# Patient Record
Sex: Female | Born: 1945 | Race: White | Hispanic: No | Smoking: Never smoker
Health system: Southern US, Community
[De-identification: ages and names within clinical notes are randomized; demographics above are authoritative.]

## PROBLEM LIST (undated history)

## (undated) DIAGNOSIS — I5031 Acute diastolic (congestive) heart failure: Secondary | ICD-10-CM

## (undated) DIAGNOSIS — M1990 Unspecified osteoarthritis, unspecified site: Secondary | ICD-10-CM

## (undated) DIAGNOSIS — E119 Type 2 diabetes mellitus without complications: Secondary | ICD-10-CM

## (undated) DIAGNOSIS — C186 Malignant neoplasm of descending colon: Secondary | ICD-10-CM

## (undated) DIAGNOSIS — C187 Malignant neoplasm of sigmoid colon: Secondary | ICD-10-CM

## (undated) DIAGNOSIS — I482 Chronic atrial fibrillation, unspecified: Secondary | ICD-10-CM

## (undated) DIAGNOSIS — I10 Essential (primary) hypertension: Secondary | ICD-10-CM

## (undated) DIAGNOSIS — E669 Obesity, unspecified: Secondary | ICD-10-CM

---

## 1998-04-21 ENCOUNTER — Ambulatory Visit (HOSPITAL_COMMUNITY): Admission: RE | Admit: 1998-04-21 | Discharge: 1998-04-21 | Payer: Self-pay | Admitting: Family Medicine

## 2000-04-20 ENCOUNTER — Emergency Department (HOSPITAL_COMMUNITY): Admission: EM | Admit: 2000-04-20 | Discharge: 2000-04-21 | Payer: Self-pay | Admitting: Emergency Medicine

## 2000-04-20 ENCOUNTER — Encounter: Payer: Self-pay | Admitting: Emergency Medicine

## 2000-04-21 ENCOUNTER — Encounter: Payer: Self-pay | Admitting: Emergency Medicine

## 2000-07-02 ENCOUNTER — Encounter: Admission: RE | Admit: 2000-07-02 | Discharge: 2000-07-17 | Payer: Self-pay | Admitting: Orthopedic Surgery

## 2002-10-30 ENCOUNTER — Encounter: Admission: RE | Admit: 2002-10-30 | Discharge: 2003-01-28 | Payer: Self-pay | Admitting: Family Medicine

## 2005-08-23 ENCOUNTER — Emergency Department (HOSPITAL_COMMUNITY): Admission: EM | Admit: 2005-08-23 | Discharge: 2005-08-23 | Payer: Self-pay | Admitting: Emergency Medicine

## 2013-05-02 ENCOUNTER — Telehealth: Payer: Self-pay | Admitting: Hematology & Oncology

## 2013-05-02 NOTE — Telephone Encounter (Signed)
Left pt message to call and schedule appointment °

## 2013-05-05 ENCOUNTER — Telehealth: Payer: Self-pay | Admitting: Hematology & Oncology

## 2013-05-05 NOTE — Telephone Encounter (Signed)
Talked with Pt to schedule appointment, she said she did not know what this was for. She took down my information and will call back to schedule after she talks to referring.

## 2013-05-07 ENCOUNTER — Telehealth: Payer: Self-pay | Admitting: Hematology & Oncology

## 2013-05-07 NOTE — Telephone Encounter (Signed)
Bobbie from referring called said pt refused referral and she will call if change her mind

## 2013-05-07 NOTE — Telephone Encounter (Signed)
Left pt message to see if she wanted to schedule appointment. Left message with Bobbi at referring to see if they had talked to patient and to call me.

## 2014-11-19 DIAGNOSIS — M1712 Unilateral primary osteoarthritis, left knee: Secondary | ICD-10-CM | POA: Diagnosis not present

## 2015-02-12 DIAGNOSIS — R51 Headache: Secondary | ICD-10-CM | POA: Diagnosis not present

## 2015-03-25 ENCOUNTER — Other Ambulatory Visit (HOSPITAL_COMMUNITY): Payer: Self-pay | Admitting: Family Medicine

## 2015-03-25 ENCOUNTER — Encounter (HOSPITAL_COMMUNITY): Payer: Self-pay | Admitting: Emergency Medicine

## 2015-03-25 ENCOUNTER — Observation Stay (HOSPITAL_COMMUNITY)
Admission: EM | Admit: 2015-03-25 | Discharge: 2015-03-27 | Disposition: A | Discharge status: Home / Self Care | Payer: Commercial Managed Care - HMO | Attending: Internal Medicine | Admitting: Internal Medicine

## 2015-03-25 ENCOUNTER — Encounter (HOSPITAL_COMMUNITY): Payer: Self-pay

## 2015-03-25 DIAGNOSIS — Z794 Long term (current) use of insulin: Secondary | ICD-10-CM

## 2015-03-25 DIAGNOSIS — I10 Essential (primary) hypertension: Secondary | ICD-10-CM | POA: Diagnosis not present

## 2015-03-25 DIAGNOSIS — Z79899 Other long term (current) drug therapy: Secondary | ICD-10-CM | POA: Diagnosis not present

## 2015-03-25 DIAGNOSIS — D649 Anemia, unspecified: Secondary | ICD-10-CM | POA: Insufficient documentation

## 2015-03-25 DIAGNOSIS — E119 Type 2 diabetes mellitus without complications: Secondary | ICD-10-CM | POA: Insufficient documentation

## 2015-03-25 DIAGNOSIS — R410 Disorientation, unspecified: Secondary | ICD-10-CM | POA: Diagnosis not present

## 2015-03-25 DIAGNOSIS — E1165 Type 2 diabetes mellitus with hyperglycemia: Secondary | ICD-10-CM | POA: Diagnosis not present

## 2015-03-25 DIAGNOSIS — N179 Acute kidney failure, unspecified: Secondary | ICD-10-CM | POA: Diagnosis not present

## 2015-03-25 DIAGNOSIS — R42 Dizziness and giddiness: Secondary | ICD-10-CM | POA: Diagnosis not present

## 2015-03-25 DIAGNOSIS — M1990 Unspecified osteoarthritis, unspecified site: Secondary | ICD-10-CM | POA: Insufficient documentation

## 2015-03-25 DIAGNOSIS — N19 Unspecified kidney failure: Secondary | ICD-10-CM

## 2015-03-25 DIAGNOSIS — IMO0001 Reserved for inherently not codable concepts without codable children: Secondary | ICD-10-CM

## 2015-03-25 DIAGNOSIS — Z9119 Patient's noncompliance with other medical treatment and regimen: Secondary | ICD-10-CM | POA: Diagnosis not present

## 2015-03-25 DIAGNOSIS — R109 Unspecified abdominal pain: Secondary | ICD-10-CM | POA: Diagnosis present

## 2015-03-25 DIAGNOSIS — M542 Cervicalgia: Secondary | ICD-10-CM | POA: Diagnosis not present

## 2015-03-25 DIAGNOSIS — N183 Chronic kidney disease, stage 3 (moderate): Secondary | ICD-10-CM | POA: Diagnosis not present

## 2015-03-25 DIAGNOSIS — R600 Localized edema: Secondary | ICD-10-CM | POA: Diagnosis not present

## 2015-03-25 DIAGNOSIS — E785 Hyperlipidemia, unspecified: Secondary | ICD-10-CM | POA: Diagnosis not present

## 2015-03-25 HISTORY — DX: Essential (primary) hypertension: I10

## 2015-03-25 HISTORY — DX: Type 2 diabetes mellitus without complications: E11.9

## 2015-03-25 HISTORY — DX: Unspecified osteoarthritis, unspecified site: M19.90

## 2015-03-25 LAB — URINALYSIS, ROUTINE W REFLEX MICROSCOPIC
BILIRUBIN URINE: NEGATIVE
Glucose, UA: NEGATIVE mg/dL
Hgb urine dipstick: NEGATIVE
KETONES UR: NEGATIVE mg/dL
Nitrite: POSITIVE — AB
PH: 5 (ref 5.0–8.0)
PROTEIN: NEGATIVE mg/dL
Specific Gravity, Urine: 1.014 (ref 1.005–1.030)
Urobilinogen, UA: 0.2 mg/dL (ref 0.0–1.0)

## 2015-03-25 LAB — CBC
HCT: 34.9 % — ABNORMAL LOW (ref 36.0–46.0)
Hemoglobin: 10.4 g/dL — ABNORMAL LOW (ref 12.0–15.0)
MCH: 27.2 pg (ref 26.0–34.0)
MCHC: 29.8 g/dL — ABNORMAL LOW (ref 30.0–36.0)
MCV: 91.1 fL (ref 78.0–100.0)
Platelets: 209 10*3/uL (ref 150–400)
RBC: 3.83 MIL/uL — ABNORMAL LOW (ref 3.87–5.11)
RDW: 15.7 % — ABNORMAL HIGH (ref 11.5–15.5)
WBC: 7.5 10*3/uL (ref 4.0–10.5)

## 2015-03-25 LAB — COMPREHENSIVE METABOLIC PANEL
ALT: 24 U/L (ref 14–54)
AST: 26 U/L (ref 15–41)
Albumin: 3.4 g/dL — ABNORMAL LOW (ref 3.5–5.0)
Alkaline Phosphatase: 55 U/L (ref 38–126)
Anion gap: 8 (ref 5–15)
BUN: 77 mg/dL — ABNORMAL HIGH (ref 6–20)
CO2: 21 mmol/L — ABNORMAL LOW (ref 22–32)
Calcium: 8.5 mg/dL — ABNORMAL LOW (ref 8.9–10.3)
Chloride: 112 mmol/L — ABNORMAL HIGH (ref 101–111)
Creatinine, Ser: 2.65 mg/dL — ABNORMAL HIGH (ref 0.44–1.00)
GFR calc Af Amer: 20 mL/min — ABNORMAL LOW (ref >60)
GFR calc non Af Amer: 17 mL/min — ABNORMAL LOW (ref >60)
Glucose, Bld: 122 mg/dL — ABNORMAL HIGH (ref 65–99)
Potassium: 4.4 mmol/L (ref 3.5–5.1)
Sodium: 141 mmol/L (ref 135–145)
Total Bilirubin: 0.5 mg/dL (ref 0.3–1.2)
Total Protein: 6.4 g/dL — ABNORMAL LOW (ref 6.5–8.1)

## 2015-03-25 LAB — URINE MICROSCOPIC-ADD ON

## 2015-03-25 LAB — LIPASE, BLOOD: Lipase: 15 U/L — ABNORMAL LOW (ref 22–51)

## 2015-03-25 MED ORDER — SODIUM CHLORIDE 0.9 % IV BOLUS (SEPSIS)
1000.0000 mL | Freq: Once | INTRAVENOUS | Status: DC
  Administered 2015-03-25: 1000 mL via INTRAVENOUS

## 2015-03-25 NOTE — ED Notes (Signed)
Pt sent by PCP after being seen for neck pain today. Pt had sudden onset of neck pain yesterday and upon follow up with PCP had some labwork drawn. Pt's PCP sent her to ED after seeing "her kidney function was low." Pt does not have hx of kidney problems. Pt denies urinary symptoms, flank pain, dysuria. Denies chest pain, SOB, dizziness. Per family, pt was having moments of confusion yesterday. A&Ox4.

## 2015-03-25 NOTE — H&P (Signed)
[facility] History and Physical  Cassandra Allen [id number] DOB: June 29, 1946 DOA: 03/25/2015  Referring physician: Alfonzo Beers, MD PCP: Cassandra Coma, MD   Chief Complaint: Abnormal Labs  HPI: Cassandra Allen is a 69 y.o. female with prior history of hypertension and diabetes mellitus presents with abnormal lab values. She states that she went to her PCP for stiffness of her neck. She states she has been taking hydrocodone for the pain. She has been on the medications since 01/25/15 for chronic pain. She had some blood work drawn and this was abnormal with her creatinine being elevated. She has no prior history of renal failure. She states that she has no fevers noted she has no abdominal pain noted. She denies having dysuria and her urine is normal color. She states that there has been no blood. She has no burning with urination. She has history of arthritis which she does not know if it is RA. She states that she has been taking losartan-HCTZ for HTN for about a year. She has been a diabetic for many years not on insulin but is taking oral glipizide.   Review of Systems:  Constitutional:  No weight loss, night sweats, Fevers, chills HEENT:  No headaches, itching, ear ache, nasal congestion, post nasal drip,  Cardio-vascular:  No chest pain, Orthopnea, PND, swelling in lower extremities  GI:  No heartburn, indigestion, abdominal pain, nausea, vomiting, diarrhea  Resp:  No shortness of breath with exertion or at rest. No coughing up of blood.No change in color of mucus.No wheezing Skin:  no rash or lesions GU:  no dysuria, change in color of urine, no urgency or frequency  Musculoskeletal:  No joint pain or swelling. No decreased range of motion. +neck pain.  Psych:  No change in mood or affect. No depression or anxiety   Past Medical History  Diagnosis Date  . Diabetes mellitus without complication   . Hypertension   . Arthritis    History reviewed. No  pertinent past surgical history. Social History:  reports that she has never smoked. She does not have any smokeless tobacco history on file. She reports that she does not drink alcohol. Her drug history is not on file.  No Known Allergies  No family history on file.   Prior to Admission medications   Medication Sig Start Date End Date Taking? Authorizing Provider  cyclobenzaprine (FLEXERIL) 10 MG tablet Take 10 mg by mouth 3 (three) times daily as needed. 03/25/15  Yes Historical Provider, MD  GLIPIZIDE XL 5 MG 24 hr tablet Take 5 mg by mouth daily. 01/06/15  Yes Historical Provider, MD  HYDROcodone-acetaminophen (NORCO/VICODIN) 5-325 MG per tablet Take 1 tablet by mouth 2 (two) times daily as needed. 02/23/15  Yes Historical Provider, MD  loperamide (IMODIUM) 2 MG capsule Take 2-4 mg by mouth 4 (four) times daily as needed for diarrhea or loose stools.   Yes Historical Provider, MD  losartan-hydrochlorothiazide (HYZAAR) 100-12.5 MG per tablet Take 1 tablet by mouth every morning. 03/16/15  Yes Historical Provider, MD  oxybutynin (DITROPAN) 5 MG tablet Take 5 mg by mouth 3 (three) times daily. 12/26/14  Yes Historical Provider, MD  SB LOW DOSE ASA EC 81 MG EC tablet Take 81 mg by mouth daily. 01/15/15  Yes Historical Provider, MD  sertraline (ZOLOFT) 50 MG tablet Take 50 mg by mouth daily. 03/11/15  Yes Historical Provider, MD  simethicone (MYLICON) 80 MG chewable tablet Chew 80-160 mg by mouth 2 (two) times daily as needed. 02/23/15  Yes Historical Provider, MD  simvastatin (ZOCOR) 20 MG tablet Take 20 mg by mouth every evening.  02/28/15  Yes Historical Provider, MD   Physical Exam: Filed Vitals:   03/25/15 2029 03/25/15 2318  BP: 112/45 116/30  Pulse: 80 77  Temp: 98.7 F (37.1 C)   TempSrc: Oral   Resp: 18 16  SpO2: 94% 94%    Wt Readings from Last 3 Encounters:  No data found for Wt    General:  Appears calm and comfortable Eyes: PERRL, normal lids, irises & conjunctiva ENT: grossly  normal hearing, lips & tongue Neck: no LAD, masses or thyromegaly Cardiovascular: RRR, no m/r/g. No LE edema. Respiratory: CTA bilaterally, no w/r/r. Normal respiratory effort. Abdomen: soft, ntnd Skin: no rash or induration seen on limited exam Musculoskeletal: grossly normal tone BUE/BLE Psychiatric: grossly normal mood and affect, speech fluent and appropriate Neurologic: grossly non-focal.          Labs on Admission:  Basic Metabolic Panel:  Recent Labs Lab 03/25/15 2114  NA 141  K 4.4  CL 112*  CO2 21*  GLUCOSE 122*  BUN 77*  CREATININE 2.65*  CALCIUM 8.5*   Liver Function Tests:  Recent Labs Lab 03/25/15 2114  AST 26  ALT 24  ALKPHOS 55  BILITOT 0.5  PROT 6.4*  ALBUMIN 3.4*    Recent Labs Lab 03/25/15 2114  LIPASE 15*   No results for input(s): AMMONIA in the last 168 hours. CBC:  Recent Labs Lab 03/25/15 2114  WBC 7.5  HGB 10.4*  HCT 34.9*  MCV 91.1  PLT 209   Cardiac Enzymes: No results for input(s): CKTOTAL, CKMB, CKMBINDEX, TROPONINI in the last 168 hours.  BNP (last 3 results) No results for input(s): BNP in the last 8760 hours.  ProBNP (last 3 results) No results for input(s): PROBNP in the last 8760 hours.  CBG: No results for input(s): GLUCAP in the last 168 hours.  Radiological Exams on Admission: No results found.    Assessment/Plan Principal Problem:   Acute renal failure Active Problems:   Essential (primary) hypertension   Diabetes mellitus without complication   AKI (acute kidney injury)   1. Acute Kidney Injury likely secondary to dehydration -likely due to dehydration but also does have diabetes which could be contributing we have no old labs here to compare -will start on IVF now -ordered US kidney -ANA sed rate RF ANCA anti GBM ordered -consider nephrology consult  2. Essential HTN -will hold losartan-HCTZ due to acute renal failure -will monitor pressures which were actually soft on  presentation  3. Diabetes Mellitus without complication -will check FSBS -SSI as needed    Code Status: Full Code (must indicate code status--if unknown or must be presumed, indicate so) DVT Prophylaxis:heparin Family Communication: None (indicate person spoken with, if applicable, with phone number if by telephone) Disposition Plan: Home (indicate anticipated LOS)  Time spent: 45min  Verne Lanuza A [facility] Pager [phone]

## 2015-03-25 NOTE — ED Notes (Signed)
Took Pt to bathroom pt was unable to urinate.

## 2015-03-25 NOTE — ED Provider Notes (Signed)
CSN: [id number]     Arrival date & time 03/25/15  2022 History   First MD Initiated Contact with Patient 03/25/15 2051     Chief Complaint  Patient presents with  . Neck Pain  . Flank Pain     (Consider location/radiation/quality/duration/timing/severity/associated sxs/prior Treatment) HPI  Pt presenting with c/o abnormal lab value.  Pt states that she was seen by her doctor earlier today for neck pain- had some labs drawn by PMD and was told to come to the ED due to abnormal kidney function.  She has hx of DM, HTN.  She has no prior hx of renal failure.  She states she has been more confused and tired than normal for the past several days.  No fever/chills.  No vomiting or diarrhea.  She states hse has been drinking liquids well.  There are no other associated systemic symptoms, there are no other alleviating or modifying factors.   Past Medical History  Diagnosis Date  . Diabetes mellitus without complication   . Hypertension   . Arthritis    History reviewed. No pertinent past surgical history. No family history on file. History  Substance Use Topics  . Smoking status: Never Smoker   . Smokeless tobacco: Not on file  . Alcohol Use: No   OB History    No data available     Review of Systems  ROS reviewed and all otherwise negative except for mentioned in HPI    Allergies  Review of patient's allergies indicates no known allergies.  Home Medications   Prior to Admission medications   Medication Sig Start Date End Date Taking? Authorizing Provider  cyclobenzaprine (FLEXERIL) 10 MG tablet Take 10 mg by mouth 3 (three) times daily as needed. 03/25/15  Yes Historical Provider, MD  GLIPIZIDE XL 5 MG 24 hr tablet Take 5 mg by mouth daily. 01/06/15  Yes Historical Provider, MD  loperamide (IMODIUM) 2 MG capsule Take 2-4 mg by mouth 4 (four) times daily as needed for diarrhea or loose stools.   Yes Historical Provider, MD  oxybutynin (DITROPAN) 5 MG tablet Take 5 mg by mouth 3  (three) times daily. 12/26/14  Yes Historical Provider, MD  SB LOW DOSE ASA EC 81 MG EC tablet Take 81 mg by mouth daily. 01/15/15  Yes Historical Provider, MD  sertraline (ZOLOFT) 50 MG tablet Take 50 mg by mouth daily. 03/11/15  Yes Historical Provider, MD  simethicone (MYLICON) 80 MG chewable tablet Chew 80-160 mg by mouth 2 (two) times daily as needed. 02/23/15  Yes Historical Provider, MD  simvastatin (ZOCOR) 20 MG tablet Take 20 mg by mouth every evening.  02/28/15  Yes Historical Provider, MD  cephALEXin (KEFLEX) 500 MG capsule Take 1 capsule (500 mg total) by mouth 2 (two) times daily. 03/27/15   Hosie Poisson, MD  folic acid (FOLVITE) 1 MG tablet Take 1 tablet (1 mg total) by mouth daily. 03/27/15   Hosie Poisson, MD  thiamine 100 MG tablet Take 1 tablet (100 mg total) by mouth daily. 03/27/15   Hosie Poisson, MD   BP 129/60 mmHg  Pulse 74  Temp(Src) 98.1 F (36.7 C) (Oral)  Resp 16  Ht 5\' 3"  (1.6 m)  Wt   SpO2 94%  Vitals reviewed Physical Exam  Physical Examination: General appearance - alert, well appearing, and in no distress Mental status - alert, oriented to person, place, and time Eyes - pupils equal and reactive, extraocular eye movements intact Mouth - mucous membranes moist, pharynx normal  without lesions Chest - clear to auscultation, no wheezes, rales or rhonchi, symmetric air entry Heart - normal rate, regular rhythm, normal S1, S2, no murmurs, rubs, clicks or gallops Abdomen - soft, nontender, nondistended, no masses or organomegaly Extremities - peripheral pulses normal, no pedal edema, no clubbing or cyanosis Skin - normal coloration and turgor, no rashes  ED Course  Procedures (including critical care time)  11:28 PM d/w Dr. Humphrey Rolls, [facility] for [facility].  He will see the patient in the ED.   Labs Review Labs Reviewed  COMPREHENSIVE METABOLIC PANEL - Abnormal; Notable for the following:    Chloride 112 (*)    CO2 21 (*)    Glucose, Bld 122 (*)    BUN 77 (*)     Creatinine, Ser 2.65 (*)    Calcium 8.5 (*)    Total Protein 6.4 (*)    Albumin 3.4 (*)    GFR calc non Af Amer 17 (*)    GFR calc Af Amer 20 (*)    All other components within normal limits  CBC - Abnormal; Notable for the following:    RBC 3.83 (*)    Hemoglobin 10.4 (*)    HCT 34.9 (*)    MCHC 29.8 (*)    RDW 15.7 (*)    All other components within normal limits  LIPASE, BLOOD - Abnormal; Notable for the following:    Lipase 15 (*)    All other components within normal limits  URINALYSIS, ROUTINE W REFLEX MICROSCOPIC - Abnormal; Notable for the following:    APPearance CLOUDY (*)    Nitrite POSITIVE (*)    Leukocytes, UA MODERATE (*)    All other components within normal limits  URINE MICROSCOPIC-ADD ON - Abnormal; Notable for the following:    Bacteria, UA MANY (*)    Casts HYALINE CASTS (*)    All other components within normal limits  COMPREHENSIVE METABOLIC PANEL - Abnormal; Notable for the following:    CO2 21 (*)    BUN 75 (*)    Creatinine, Ser 2.54 (*)    Calcium 8.3 (*)    Total Protein 6.0 (*)    Albumin 3.2 (*)    GFR calc non Af Amer 18 (*)    GFR calc Af Amer 21 (*)    All other components within normal limits  CBC - Abnormal; Notable for the following:    RBC 3.41 (*)    Hemoglobin 9.4 (*)    HCT 31.0 (*)    RDW 15.7 (*)    All other components within normal limits  HEMOGLOBIN A1C - Abnormal; Notable for the following:    Hgb A1c MFr Bld 7.0 (*)    All other components within normal limits  SEDIMENTATION RATE - Abnormal; Notable for the following:    Sed Rate 45 (*)    All other components within normal limits  GLUCOSE, CAPILLARY - Abnormal; Notable for the following:    Glucose-Capillary 115 (*)    All other components within normal limits  BASIC METABOLIC PANEL - Abnormal; Notable for the following:    Chloride 114 (*)    Glucose, Bld 191 (*)    BUN 50 (*)    Creatinine, Ser 1.29 (*)    Calcium 8.3 (*)    GFR calc non Af Amer 42 (*)    GFR  calc Af Amer 48 (*)    All other components within normal limits  GLUCOSE, CAPILLARY - Abnormal; Notable for the following:  Glucose-Capillary 171 (*)    All other components within normal limits  GLUCOSE, CAPILLARY - Abnormal; Notable for the following:    Glucose-Capillary 217 (*)    All other components within normal limits  GLUCOSE, CAPILLARY - Abnormal; Notable for the following:    Glucose-Capillary 142 (*)    All other components within normal limits  URINE CULTURE  TSH  RHEUMATOID FACTOR  GLUCOSE, CAPILLARY  SODIUM, URINE, RANDOM  CREATININE, URINE, RANDOM  ANCA TITERS  GLOMERULAR BASEMENT MEMBRANE ANTIBODIES    Imaging Review US Renal  03/26/2015   CLINICAL DATA:  Acute renal failure.  Hypertension.  Diabetes.  EXAM: RENAL / URINARY TRACT ULTRASOUND COMPLETE  COMPARISON:  None.  FINDINGS: Right Kidney:  Length: 10.9 cm. No hydronephrosis. Normal renal cortical thickness and echogenicity.  Left Kidney:  Length: 11.3 cm. No hydronephrosis. Normal renal cortical thickness and echogenicity.  Bladder:  Appears normal for degree of bladder distention.  IMPRESSION: Normal renal ultrasound.   Electronically Signed   By: Abigail Miyamoto M.D.   On: 03/26/2015 09:26     EKG Interpretation None      MDM   Final diagnoses:  Renal failure  Anemia, unspecified anemia type    Pt presenting with new renal failure- hx of DM and HTN.  Pt is anemic also but does not seem to be symptomatic from this.  Her blo is well controlled in the ED.  Pt treated with IV fluids, admitted to [facility] for further workup and management of renal failure.  Potassium normal.      Alfonzo Beers, MD 03/27/15 1719

## 2015-03-26 ENCOUNTER — Observation Stay (HOSPITAL_COMMUNITY): Payer: Commercial Managed Care - HMO

## 2015-03-26 DIAGNOSIS — Z79899 Other long term (current) drug therapy: Secondary | ICD-10-CM | POA: Diagnosis not present

## 2015-03-26 DIAGNOSIS — N179 Acute kidney failure, unspecified: Secondary | ICD-10-CM | POA: Diagnosis not present

## 2015-03-26 DIAGNOSIS — D649 Anemia, unspecified: Secondary | ICD-10-CM | POA: Diagnosis not present

## 2015-03-26 DIAGNOSIS — I10 Essential (primary) hypertension: Secondary | ICD-10-CM | POA: Diagnosis not present

## 2015-03-26 DIAGNOSIS — M1990 Unspecified osteoarthritis, unspecified site: Secondary | ICD-10-CM | POA: Diagnosis not present

## 2015-03-26 DIAGNOSIS — E119 Type 2 diabetes mellitus without complications: Secondary | ICD-10-CM | POA: Diagnosis not present

## 2015-03-26 DIAGNOSIS — M542 Cervicalgia: Secondary | ICD-10-CM | POA: Diagnosis not present

## 2015-03-26 LAB — COMPREHENSIVE METABOLIC PANEL
ALT: 22 U/L (ref 14–54)
ANION GAP: 12 (ref 5–15)
AST: 24 U/L (ref 15–41)
Albumin: 3.2 g/dL — ABNORMAL LOW (ref 3.5–5.0)
Alkaline Phosphatase: 52 U/L (ref 38–126)
BILIRUBIN TOTAL: 0.6 mg/dL (ref 0.3–1.2)
BUN: 75 mg/dL — ABNORMAL HIGH (ref 6–20)
CHLORIDE: 109 mmol/L (ref 101–111)
CO2: 21 mmol/L — ABNORMAL LOW (ref 22–32)
CREATININE: 2.54 mg/dL — AB (ref 0.44–1.00)
Calcium: 8.3 mg/dL — ABNORMAL LOW (ref 8.9–10.3)
GFR calc non Af Amer: 18 mL/min — ABNORMAL LOW (ref >60)
GFR, EST AFRICAN AMERICAN: 21 mL/min — AB (ref >60)
Glucose, Bld: 80 mg/dL (ref 65–99)
Potassium: 4.4 mmol/L (ref 3.5–5.1)
Sodium: 142 mmol/L (ref 135–145)
TOTAL PROTEIN: 6 g/dL — AB (ref 6.5–8.1)

## 2015-03-26 LAB — GLUCOSE, CAPILLARY
GLUCOSE-CAPILLARY: 115 mg/dL — AB (ref 65–99)
GLUCOSE-CAPILLARY: 171 mg/dL — AB (ref 65–99)
GLUCOSE-CAPILLARY: 217 mg/dL — AB (ref 65–99)
Glucose-Capillary: 69 mg/dL (ref 65–99)

## 2015-03-26 LAB — CBC
HCT: 31 % — ABNORMAL LOW (ref 36.0–46.0)
Hemoglobin: 9.4 g/dL — ABNORMAL LOW (ref 12.0–15.0)
MCH: 27.6 pg (ref 26.0–34.0)
MCHC: 30.3 g/dL (ref 30.0–36.0)
MCV: 90.9 fL (ref 78.0–100.0)
Platelets: 199 10*3/uL (ref 150–400)
RBC: 3.41 MIL/uL — ABNORMAL LOW (ref 3.87–5.11)
RDW: 15.7 % — ABNORMAL HIGH (ref 11.5–15.5)
WBC: 5.7 10*3/uL (ref 4.0–10.5)

## 2015-03-26 LAB — CREATININE, URINE, RANDOM: CREATININE, URINE: 147.22 mg/dL

## 2015-03-26 LAB — SEDIMENTATION RATE: SED RATE: 45 mm/h — AB (ref 0–22)

## 2015-03-26 LAB — TSH: TSH: 0.569 u[IU]/mL (ref 0.350–4.500)

## 2015-03-26 LAB — SODIUM, URINE, RANDOM: Sodium, Ur: 52 mmol/L

## 2015-03-26 MED ORDER — ONDANSETRON HCL 4 MG PO TABS: 4.0000 mg | ORAL_TABLET | Freq: Four times a day (QID) | ORAL | Status: DC | PRN

## 2015-03-26 MED ORDER — LOPERAMIDE HCL 2 MG PO CAPS: 2.0000 mg | ORAL_CAPSULE | Freq: Four times a day (QID) | ORAL | Status: DC | PRN

## 2015-03-26 MED ORDER — SERTRALINE HCL 50 MG PO TABS
50.0000 mg | ORAL_TABLET | Freq: Every day | ORAL | Status: DC
  Administered 2015-03-26 – 2015-03-27 (×2): 50 mg via ORAL
  Filled 2015-03-26 (×2): qty 1

## 2015-03-26 MED ORDER — FOLIC ACID 1 MG PO TABS
1.0000 mg | ORAL_TABLET | Freq: Every day | ORAL | Status: DC
  Administered 2015-03-26 – 2015-03-27 (×2): 1 mg via ORAL
  Filled 2015-03-26 (×2): qty 1

## 2015-03-26 MED ORDER — OXYCODONE HCL 5 MG PO TABS: 5.0000 mg | ORAL_TABLET | ORAL | Status: DC | PRN

## 2015-03-26 MED ORDER — ADULT MULTIVITAMIN W/MINERALS CH
1.0000 | ORAL_TABLET | Freq: Every day | ORAL | Status: DC
  Administered 2015-03-26 – 2015-03-27 (×2): 1 via ORAL
  Filled 2015-03-26 (×2): qty 1

## 2015-03-26 MED ORDER — ACETAMINOPHEN 325 MG PO TABS: 650.0000 mg | ORAL_TABLET | Freq: Four times a day (QID) | ORAL | Status: DC | PRN

## 2015-03-26 MED ORDER — OXYBUTYNIN CHLORIDE 5 MG PO TABS
5.0000 mg | ORAL_TABLET | Freq: Three times a day (TID) | ORAL | Status: DC
  Administered 2015-03-26 – 2015-03-27 (×4): 5 mg via ORAL
  Filled 2015-03-26 (×6): qty 1

## 2015-03-26 MED ORDER — SODIUM CHLORIDE 0.9 % IV SOLN
INTRAVENOUS | Status: DC
  Administered 2015-03-26 – 2015-03-27 (×2): via INTRAVENOUS

## 2015-03-26 MED ORDER — VITAMIN B-1 100 MG PO TABS
100.0000 mg | ORAL_TABLET | Freq: Every day | ORAL | Status: DC
  Administered 2015-03-26 – 2015-03-27 (×2): 100 mg via ORAL
  Filled 2015-03-26 (×2): qty 1

## 2015-03-26 MED ORDER — CYCLOBENZAPRINE HCL 10 MG PO TABS
10.0000 mg | ORAL_TABLET | Freq: Three times a day (TID) | ORAL | Status: DC | PRN
Start: 2015-03-26 — End: 2015-03-27
  Administered 2015-03-26: 10 mg via ORAL
  Filled 2015-03-26: qty 1

## 2015-03-26 MED ORDER — GLIPIZIDE ER 5 MG PO TB24
5.0000 mg | ORAL_TABLET | Freq: Every day | ORAL | Status: DC
  Administered 2015-03-27: 5 mg via ORAL
  Filled 2015-03-26 (×3): qty 1

## 2015-03-26 MED ORDER — HEPARIN SODIUM (PORCINE) 5000 UNIT/ML IJ SOLN
5000.0000 [IU] | Freq: Three times a day (TID) | INTRAMUSCULAR | Status: DC
  Administered 2015-03-26 – 2015-03-27 (×3): 5000 [IU] via SUBCUTANEOUS
  Filled 2015-03-26 (×8): qty 1

## 2015-03-26 MED ORDER — INSULIN ASPART 100 UNIT/ML ~~LOC~~ SOLN
0.0000 [IU] | Freq: Three times a day (TID) | SUBCUTANEOUS | Status: DC
  Administered 2015-03-26: 3 [IU] via SUBCUTANEOUS
  Administered 2015-03-27: 2 [IU] via SUBCUTANEOUS

## 2015-03-26 MED ORDER — SIMETHICONE 80 MG PO CHEW
80.0000 mg | CHEWABLE_TABLET | Freq: Four times a day (QID) | ORAL | Status: DC | PRN
Start: 2015-03-26 — End: 2015-03-27
  Filled 2015-03-26: qty 2

## 2015-03-26 MED ORDER — SIMVASTATIN 20 MG PO TABS
20.0000 mg | ORAL_TABLET | Freq: Every evening | ORAL | Status: DC
  Administered 2015-03-26: 20 mg via ORAL
  Filled 2015-03-26 (×2): qty 1

## 2015-03-26 MED ORDER — ONDANSETRON HCL 4 MG/2ML IJ SOLN: 4.0000 mg | Freq: Four times a day (QID) | INTRAMUSCULAR | Status: DC | PRN

## 2015-03-26 MED ORDER — CEFTRIAXONE SODIUM IN DEXTROSE 20 MG/ML IV SOLN
1.0000 g | INTRAVENOUS | Status: DC
  Administered 2015-03-26: 1 g via INTRAVENOUS
  Filled 2015-03-26 (×2): qty 50

## 2015-03-26 MED ORDER — ACETAMINOPHEN 650 MG RE SUPP: 650.0000 mg | Freq: Four times a day (QID) | RECTAL | Status: DC | PRN

## 2015-03-26 NOTE — Progress Notes (Signed)
[facility] PROGRESS NOTE  Cassandra Allen VXB:[id number] DOB: 01-16-1946 DOA: 03/25/2015 PCP: Lilian Coma, MD  Assessment/Plan: 1. Acute renal failure : Possibly from long standing DM and hypertension.  US RENAL ruled out obstruction.  UA is suggestive of UTI, unsure if she has pyelonephritis.  She is afebrile and no leukocytosis.  Repeat value improved when compared to the level on admission.  RESUME IV hydration.    Hypertension: controlled.  Resume home meds except of ACE inhibitors.    Diabetes mellitus: CBG (last 3)   Recent Labs  03/26/15 1018 03/26/15 1125  GLUCAP 69 115*    Resume SSI and glucotrol.     Code Status: full code Family Communication:  Family at bedside Disposition Plan: pending. Possibly home tomorrow, when her renal function mrpoves.    Consultants:  none  Procedures:  US renal  Antibiotics:  rocephin   HPI/Subjective: No new complaints.   Objective: Filed Vitals:   03/26/15 0434  BP: 140/50  Pulse:   Temp:   Resp:     Intake/Output Summary (Last 24 hours) at 03/26/15 1522 Last data filed at 03/26/15 0846  Gross per 24 hour  Intake 333.75 ml  Output    900 ml  Net -566.25 ml   Filed Weights    Exam:   General:  Alert afebrile comfortable  Cardiovascular: s1s2  Respiratory: clear to auscultation, no wheezing or rhonchi  Abdomen: soft non tender non distended bowel sounds heard  Musculoskeletal: 1_+ pedal edema.   Data Reviewed: Basic Metabolic Panel:  Recent Labs Lab 03/25/15 2114 03/26/15 0100  NA 141 142  K 4.4 4.4  CL 112* 109  CO2 21* 21*  GLUCOSE 122* 80  BUN 77* 75*  CREATININE 2.65* 2.54*  CALCIUM 8.5* 8.3*   Liver Function Tests:  Recent Labs Lab 03/25/15 2114 03/26/15 0100  AST 26 24  ALT 24 22  ALKPHOS 55 52  BILITOT 0.5 0.6  PROT 6.4* 6.0*  ALBUMIN 3.4* 3.2*    Recent Labs Lab 03/25/15 2114  LIPASE 15*   No results for input(s): AMMONIA in the last  168 hours. CBC:  Recent Labs Lab 03/25/15 2114 03/26/15 0100  WBC 7.5 5.7  HGB 10.4* 9.4*  HCT 34.9* 31.0*  MCV 91.1 90.9  PLT 209 199   Cardiac Enzymes: No results for input(s): CKTOTAL, CKMB, CKMBINDEX, TROPONINI in the last 168 hours. BNP (last 3 results) No results for input(s): BNP in the last 8760 hours.  ProBNP (last 3 results) No results for input(s): PROBNP in the last 8760 hours.  CBG:  Recent Labs Lab 03/26/15 1018 03/26/15 1125  GLUCAP 69 115*    No results found for this or any previous visit (from the past 240 hour(s)).   Studies: US Renal  03/26/2015   CLINICAL DATA:  Acute renal failure.  Hypertension.  Diabetes.  EXAM: RENAL / URINARY TRACT ULTRASOUND COMPLETE  COMPARISON:  None.  FINDINGS: Right Kidney:  Length: 10.9 cm. No hydronephrosis. Normal renal cortical thickness and echogenicity.  Left Kidney:  Length: 11.3 cm. No hydronephrosis. Normal renal cortical thickness and echogenicity.  Bladder:  Appears normal for degree of bladder distention.  IMPRESSION: Normal renal ultrasound.   Electronically Signed   By: Abigail Miyamoto M.D.   On: 03/26/2015 09:26    Scheduled Meds: . cefTRIAXone (ROCEPHIN)  IV  1 g Intravenous Q24H  . folic acid  1 mg Oral Daily  . glipiZIDE  5 mg Oral Q breakfast  . heparin  5,000 Units Subcutaneous 3 times per day  . insulin aspart  0-15 Units Subcutaneous TID WC  . multivitamin with minerals  1 tablet Oral Daily  . oxybutynin  5 mg Oral TID  . sertraline  50 mg Oral Daily  . simvastatin  20 mg Oral QPM  . thiamine  100 mg Oral Daily   Continuous Infusions: . sodium chloride 75 mL/hr at 03/26/15 1310    Principal Problem:   Acute renal failure Active Problems:   Essential (primary) hypertension   Diabetes mellitus without complication   AKI (acute kidney injury)    Time spent: 30 minutes    Tyree Vandruff  [facility] Pager [phone]  If 7PM-7AM, please contact night-coverage at [email or web address], password  [facility] 03/26/2015, 3:22 PM

## 2015-03-26 NOTE — Progress Notes (Signed)
Ice cream and crackers with peanut butter given to patient. Breakfast tray ordered. Blood sugar to be re-checked.

## 2015-03-27 DIAGNOSIS — I10 Essential (primary) hypertension: Secondary | ICD-10-CM | POA: Diagnosis not present

## 2015-03-27 DIAGNOSIS — E119 Type 2 diabetes mellitus without complications: Secondary | ICD-10-CM | POA: Diagnosis not present

## 2015-03-27 DIAGNOSIS — M542 Cervicalgia: Secondary | ICD-10-CM | POA: Diagnosis not present

## 2015-03-27 DIAGNOSIS — M1990 Unspecified osteoarthritis, unspecified site: Secondary | ICD-10-CM | POA: Diagnosis not present

## 2015-03-27 DIAGNOSIS — D649 Anemia, unspecified: Secondary | ICD-10-CM | POA: Diagnosis not present

## 2015-03-27 DIAGNOSIS — N179 Acute kidney failure, unspecified: Secondary | ICD-10-CM | POA: Diagnosis not present

## 2015-03-27 DIAGNOSIS — Z79899 Other long term (current) drug therapy: Secondary | ICD-10-CM | POA: Diagnosis not present

## 2015-03-27 LAB — GLUCOSE, CAPILLARY: Glucose-Capillary: 142 mg/dL — ABNORMAL HIGH (ref 65–99)

## 2015-03-27 LAB — HEMOGLOBIN A1C
Hgb A1c MFr Bld: 7 % — ABNORMAL HIGH (ref 4.8–5.6)
Mean Plasma Glucose: 154 mg/dL

## 2015-03-27 LAB — RHEUMATOID FACTOR: RHEUMATOID FACTOR: 10.8 [IU]/mL (ref 0.0–13.9)

## 2015-03-27 MED ORDER — FOLIC ACID 1 MG PO TABS: 1.0000 mg | ORAL_TABLET | Freq: Every day | ORAL | Status: DC

## 2015-03-27 MED ORDER — THIAMINE HCL 100 MG PO TABS: 100.0000 mg | ORAL_TABLET | Freq: Every day | ORAL | Status: DC

## 2015-03-27 MED ORDER — CEPHALEXIN 500 MG PO CAPS: 500.0000 mg | ORAL_CAPSULE | Freq: Two times a day (BID) | ORAL | Status: DC

## 2015-03-27 NOTE — Discharge Summary (Signed)
Physician Discharge Summary  Cassandra Allen [id number] DOB: 1946/08/19 DOA: 03/25/2015  PCP: Lilian Coma, MD  Admit date: 03/25/2015 Discharge date: 03/27/2015  Time spent: 30 minutes  Recommendations for Outpatient Follow-up:  1. Follow up with PCP on Monday.   Discharge Diagnoses:  Principal Problem:   Acute renal failure Active Problems:   Essential (primary) hypertension   Diabetes mellitus without complication   AKI (acute kidney injury)   Discharge Condition: improved.   Diet recommendation: carb modified diet  Filed Weights    History of present illness:  69 year old admitted for acute renal failure.   Hospital Course:  1. Acute renal failure :  Possibly from dehydration in addition to  long standing DM and hypertension.  US RENAL ruled out obstruction.  UA is suggestive of UTI, unsure if she has pyelonephritis.  She is afebrile and no leukocytosis.  Repeat value improved when compared to the level on admission. pt insisted on going home. Outpatient follow up.     Hypertension: better controlled.    Diabetes mellitus: CBG (last 3)   Recent Labs (last 2 labs)      Recent Labs  03/26/15 1018 03/26/15 1125  GLUCAP 69 115*      Resume SSI and glucotrol.        Procedures:  US RENAL  Consultations:  none  Discharge Exam: Filed Vitals:   03/27/15 0542  BP: 129/60  Pulse: 74  Temp: 98.1 F (36.7 C)  Resp: 16    General: ALERT AFEBRILE comfortable Cardiovascular: s1s2 Respiratory: ctab  Discharge Instructions   Discharge Instructions    Diet - low sodium heart healthy    Complete by:  As directed      Discharge instructions    Complete by:  As directed   Follow up with PCP on Monday. Follow up with BMP on Monday.  We have stopped your BP medication because of your renal function. Plan to resume it as per your PCP when your renal function improves.          Current Discharge Medication List    START  taking these medications   Details  cephALEXin (KEFLEX) 500 MG capsule Take 1 capsule (500 mg total) by mouth 2 (two) times daily. Qty: 8 capsule, Refills: 0    folic acid (FOLVITE) 1 MG tablet Take 1 tablet (1 mg total) by mouth daily.    thiamine 100 MG tablet Take 1 tablet (100 mg total) by mouth daily.      CONTINUE these medications which have NOT CHANGED   Details  cyclobenzaprine (FLEXERIL) 10 MG tablet Take 10 mg by mouth 3 (three) times daily as needed.    GLIPIZIDE XL 5 MG 24 hr tablet Take 5 mg by mouth daily.    loperamide (IMODIUM) 2 MG capsule Take 2-4 mg by mouth 4 (four) times daily as needed for diarrhea or loose stools.    oxybutynin (DITROPAN) 5 MG tablet Take 5 mg by mouth 3 (three) times daily.    SB LOW DOSE ASA EC 81 MG EC tablet Take 81 mg by mouth daily.    sertraline (ZOLOFT) 50 MG tablet Take 50 mg by mouth daily. Refills: 2    simethicone (MYLICON) 80 MG chewable tablet Chew 80-160 mg by mouth 2 (two) times daily as needed.    simvastatin (ZOCOR) 20 MG tablet Take 20 mg by mouth every evening.       STOP taking these medications     HYDROcodone-acetaminophen (NORCO/VICODIN) 5-325  MG per tablet      losartan-hydrochlorothiazide (HYZAAR) 100-12.5 MG per tablet        No Known Allergies Follow-up Information    Follow up with Lilian Coma, MD. Schedule an appointment as soon as possible for a visit in 1 week.   Specialty:  Family Medicine   Contact information:   [address] [place] [address] [phone]        The results of significant diagnostics from this hospitalization (including imaging, microbiology, ancillary and laboratory) are listed below for reference.    Significant Diagnostic Studies: US Renal  03/26/2015   CLINICAL DATA:  Acute renal failure.  Hypertension.  Diabetes.  EXAM: RENAL / URINARY TRACT ULTRASOUND COMPLETE  COMPARISON:  None.  FINDINGS: Right Kidney:  Length: 10.9 cm. No  hydronephrosis. Normal renal cortical thickness and echogenicity.  Left Kidney:  Length: 11.3 cm. No hydronephrosis. Normal renal cortical thickness and echogenicity.  Bladder:  Appears normal for degree of bladder distention.  IMPRESSION: Normal renal ultrasound.   Electronically Signed   By: Abigail Miyamoto M.D.   On: 03/26/2015 09:26    Microbiology: No results found for this or any previous visit (from the past 240 hour(s)).   Labs: Basic Metabolic Panel:  Recent Labs Lab 03/25/15 2114 03/26/15 0100 03/27/15 0430  NA 141 142 144  K 4.4 4.4 4.7  CL 112* 109 114*  CO2 21* 21* 23  GLUCOSE 122* 80 191*  BUN 77* 75* 50*  CREATININE 2.65* 2.54* 1.29*  CALCIUM 8.5* 8.3* 8.3*   Liver Function Tests:  Recent Labs Lab 03/25/15 2114 03/26/15 0100  AST 26 24  ALT 24 22  ALKPHOS 55 52  BILITOT 0.5 0.6  PROT 6.4* 6.0*  ALBUMIN 3.4* 3.2*    Recent Labs Lab 03/25/15 2114  LIPASE 15*   No results for input(s): AMMONIA in the last 168 hours. CBC:  Recent Labs Lab 03/25/15 2114 03/26/15 0100  WBC 7.5 5.7  HGB 10.4* 9.4*  HCT 34.9* 31.0*  MCV 91.1 90.9  PLT 209 199   Cardiac Enzymes: No results for input(s): CKTOTAL, CKMB, CKMBINDEX, TROPONINI in the last 168 hours. BNP: BNP (last 3 results) No results for input(s): BNP in the last 8760 hours.  ProBNP (last 3 results) No results for input(s): PROBNP in the last 8760 hours.  CBG:  Recent Labs Lab 03/26/15 1018 03/26/15 1125 03/26/15 1715 03/26/15 2115 03/27/15 0802  GLUCAP 69 115* 171* 217* 142*       Signed:  Manmeet Arzola  [facility] 03/27/2015, 10:51 AM

## 2015-03-27 NOTE — Progress Notes (Signed)
Patient's d/c instructions given to patient and also prescriptions handed to patient,,verbalized understanding,teach back utiltized,denies pain,stable to go home .

## 2015-03-27 NOTE — Progress Notes (Signed)
Patient d/c home. Stable. 

## 2015-03-28 LAB — BASIC METABOLIC PANEL
ANION GAP: 7 (ref 5–15)
BUN: 50 mg/dL — AB (ref 6–20)
CO2: 23 mmol/L (ref 22–32)
CREATININE: 1.29 mg/dL — AB (ref 0.44–1.00)
Calcium: 8.3 mg/dL — ABNORMAL LOW (ref 8.9–10.3)
Chloride: 114 mmol/L — ABNORMAL HIGH (ref 101–111)
GFR calc Af Amer: 48 mL/min — ABNORMAL LOW (ref >60)
GFR calc non Af Amer: 42 mL/min — ABNORMAL LOW (ref >60)
GLUCOSE: 191 mg/dL — AB (ref 65–99)
Potassium: 4.7 mmol/L (ref 3.5–5.1)
SODIUM: 144 mmol/L (ref 135–145)

## 2015-03-28 LAB — URINE CULTURE: Colony Count: >=100000

## 2015-03-29 LAB — ANCA TITERS
Atypical P-ANCA titer: <1:20 {titer}
P-ANCA: <1:20 {titer}

## 2015-03-29 LAB — GLOMERULAR BASEMENT MEMBRANE ANTIBODIES: GBM Ab: 4 units (ref 0–20)

## 2015-03-30 DIAGNOSIS — E1121 Type 2 diabetes mellitus with diabetic nephropathy: Secondary | ICD-10-CM | POA: Diagnosis not present

## 2015-03-30 DIAGNOSIS — D649 Anemia, unspecified: Secondary | ICD-10-CM | POA: Diagnosis not present

## 2015-03-30 DIAGNOSIS — N179 Acute kidney failure, unspecified: Secondary | ICD-10-CM | POA: Diagnosis not present

## 2015-04-08 DIAGNOSIS — M542 Cervicalgia: Secondary | ICD-10-CM | POA: Diagnosis not present

## 2015-04-09 ENCOUNTER — Ambulatory Visit
Admission: RE | Admit: 2015-04-09 | Discharge: 2015-04-09 | Disposition: A | Discharge status: Home / Self Care | Payer: Commercial Managed Care - HMO | Source: Ambulatory Visit | Attending: Family Medicine | Admitting: Family Medicine

## 2015-04-09 ENCOUNTER — Other Ambulatory Visit: Payer: Self-pay | Admitting: Family Medicine

## 2015-04-09 DIAGNOSIS — M47812 Spondylosis without myelopathy or radiculopathy, cervical region: Secondary | ICD-10-CM | POA: Diagnosis not present

## 2015-04-09 DIAGNOSIS — M542 Cervicalgia: Secondary | ICD-10-CM

## 2015-04-09 DIAGNOSIS — M5032 Other cervical disc degeneration, mid-cervical region: Secondary | ICD-10-CM | POA: Diagnosis not present

## 2015-04-28 ENCOUNTER — Encounter: Payer: Self-pay | Admitting: Physical Therapy

## 2015-04-28 ENCOUNTER — Ambulatory Visit: Payer: Commercial Managed Care - HMO | Attending: Family Medicine | Admitting: Physical Therapy

## 2015-04-28 DIAGNOSIS — M542 Cervicalgia: Secondary | ICD-10-CM

## 2015-04-28 DIAGNOSIS — M436 Torticollis: Secondary | ICD-10-CM | POA: Diagnosis not present

## 2015-04-28 NOTE — Therapy (Signed)
[facility] [address], [place], [place], [address] Phone: [phone]   Fax:  [phone]  Physical Therapy Evaluation  Patient Details  Name: Cassandra Allen MRN: [id number] Date of Birth: 1946-01-06 Referring Provider:  [name], MD  Encounter Date: 04/28/2015      PT End of Session - 04/28/15 1302    Visit Number 1   Date for PT Re-Evaluation 06/09/15   PT Start Time 1230   PT Stop Time 1310   PT Time Calculation (min) 40 min   Activity Tolerance Patient tolerated treatment well   Behavior During Therapy [facility] for tasks assessed/performed      Past Medical History  Diagnosis Date  . Diabetes mellitus without complication   . Hypertension   . Arthritis     History reviewed. No pertinent past surgical history.  There were no vitals filed for this visit.  Visit Diagnosis:  Stiffness of cervical spine - Plan: PT plan of care cert/re-cert  Cervical pain (neck) - Plan: PT plan of care cert/re-cert      Subjective Assessment - 04/28/15 1236    Subjective Patient reports 1 month ago she had sudden onset of cervical pain. Patient reports her neck keeps falling forward.  Patient has to hold her head up due to it falling forward.    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  No   Is the patient reluctant to leave their home because of a fear of falling?  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   Observation/Other Assessments   Focus on Therapeutic Outcomes (FOTO)  46% limitation   Posture/Postural Control   Posture/Postural Control Postural limitations   Postural Limitations Forward head;Rounded Shoulders   AROM   Cervical Extension decreased by 25%   Cervical - Right Side Bend decreased by 75%   Cervical - Left Side Bend decreased by 25%   Cervical - Right Rotation decreased by 50%   Cervical - Left Rotation decreased by 25%   Strength   Overall Strength Comments cervical strength is 4/5   Palpation   Palpation comment tenderness located in bilateral paraspinals, suboccipital, and tightness in bilateral paraspinals                   [facility] Adult PT Treatment/Exercise - 04/28/15 0001    Neck Exercises: Supine   Neck Retraction 10 reps;5 secs   Manual Therapy   Manual Therapy Soft tissue mobilization  Soft tissue mobilization cervical paraspinals                PT Education - May 19, 2015 1309    Education provided Yes   Education Details cervical AROM and retraction   Person(s) Educated Patient   Methods Explanation;Demonstration;Tactile cues;Verbal cues;Handout   Comprehension Returned demonstration;Verbalized understanding          PT Short Term Goals - 05/19/15 1317    PT SHORT TERM GOAL #1   Title pain in cervcial at end of day decreased >/= 25%   Time 3   Period Weeks   Status New   PT SHORT TERM GOAL #2   Title ability to hold head up while working on [employer] has improved >/= 25% greater ease   Time 3   Period Weeks   Status New           PT Long Term Goals - May 19, 2015 1319    PT LONG TERM GOAL #1   Title indepentdent  with HEP   Time 6   Period Weeks   Status New   PT LONG TERM GOAL #2   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  Patient reports her pain began suddenly 4 weeks ago.  Patient reports her pain is intermittent 2/10 in cervical.  Cervical ROM for extension iand right sidebending is decreased by 75%, left rotation and sidebending decreased by 25%.  Palpable tenderness located in cervical paraspinals.  Cervical strength is 4/5.  FOTO score is 46% limitation.  Patient reports she is having difficulty holding her head up while working on the [employer]. Patient would benefit from physical therapy to decreasae her pain and increase the strength of the cervical musculature.    Pt will benefit from skilled therapeutic intervention in order to improve on the following deficits Decreased mobility;Decreased strength;Decreased activity tolerance;Pain;Decreased endurance;Decreased range of motion   Rehab Potential Excellent   Clinical Impairments Affecting Rehab Potential None   PT Frequency 2x / week   PT Duration 6 weeks   PT Next Visit Plan modalities as need to cervical, soft tissue work to cervical, strengthening exercises for cervical   PT Home Exercise Plan cervical isometrics   Recommended Other Services None   Consulted and Agree with Plan of Care Patient          G-Codes - 05-19-15 1230    Functional Assessment Tool Used FOTO score is 46% limitation   goal 32% limitation   Functional Limitation Carrying, moving and handling objects   Carrying, Moving and Handling Objects Current Status (X3818) At least 40 percent but less than 60 percent  impaired, limited or restricted   Carrying, Moving and Handling Objects Goal Status (E9937) At least 20 percent but less than 40 percent impaired, limited or restricted       Problem List Patient Active Problem List   Diagnosis Date Noted  . Acute renal failure 03/25/2015  . Essential (primary) hypertension 03/25/2015  .  Diabetes mellitus without complication 70/78/6754  . AKI (acute kidney injury) 03/25/2015    Lorelie Biermann,Amorie,PT 04/28/2015, 1:23 PM  [facility] [address] [place], [place], [address] Phone: [phone]   Fax:  [phone]

## 2015-04-28 NOTE — Patient Instructions (Signed)
Flexibility: Neck Retraction   Laying down hold 5 sec. 10x 2 x/day; Pull head straight back, keeping eyes and jaw level. Sitting or standing 1 times per set. Every hour  http://orth.exer.us/344   Copyright  VHI. All rights reserved.  AROM: Neck Rotation   Turn head slowly to look over one shoulder, then the other. Hold each position _3___ seconds. Repeat __3__ times per set. Do __1__ sets per session. Do _1___ sessions per day. Then do the other way http://orth.exer.us/294   Copyright  VHI. All rights reserved.  AROM: Lateral Neck Flexion   Slowly tilt head toward one shoulder, then the other. Hold each position _3___ seconds. Repeat __3__ times per set. Do __1__ sets per session. Do ___1_ sessions per day. Then do the other side http://orth.exer.us/296   Copyright  VHI. All rights reserved.  [place] [address] [place], [place] [address] Phone # [phone] Fax [phone]

## 2015-04-30 ENCOUNTER — Encounter: Payer: Self-pay | Admitting: Physical Therapy

## 2015-04-30 ENCOUNTER — Ambulatory Visit: Payer: Commercial Managed Care - HMO | Admitting: Physical Therapy

## 2015-04-30 DIAGNOSIS — M542 Cervicalgia: Secondary | ICD-10-CM | POA: Diagnosis not present

## 2015-04-30 DIAGNOSIS — M436 Torticollis: Secondary | ICD-10-CM | POA: Diagnosis not present

## 2015-04-30 NOTE — Therapy (Signed)
[facility] [address], [place], [place], [address] Phone: [phone]   Fax:  [phone]  Physical Therapy Treatment  Patient Details  Name: Cassandra Allen MRN: [id number] Date of Birth: [birth year] Referring Provider:  [name], MD  Encounter Date: 04/30/2015      PT End of Session - 04/30/15 0918    Visit Number 2   Date for PT Re-Evaluation 06/09/15   PT Start Time 0845   PT Stop Time 0940   PT Time Calculation (min) 55 min   Activity Tolerance Patient tolerated treatment well   Behavior During Therapy [facility] for tasks assessed/performed      Past Medical History  Diagnosis Date  . Diabetes mellitus without complication   . Hypertension   . Arthritis     History reviewed. No pertinent past surgical history.  There were no vitals filed for this visit.  Visit Diagnosis:  Stiffness of cervical spine  Cervical pain (neck)      Subjective Assessment - 04/30/15 0850    Subjective Lt posterior cervical "sore" after eval. At work yesterday she noticed the neck was not as sore.    Currently in Pain? Yes   Pain Score 5    Pain Location Neck   Pain Orientation Left;Posterior   Pain Descriptors / Indicators Sore   Pain Type Acute pain   Multiple Pain Sites No                         [facility] Adult PT Treatment/Exercise - 04/30/15 0001    Neck Exercises: Seated   Cervical Rotation Both;10 reps   Lateral Flexion Both;10 reps   Shoulder Rolls 20 reps   Neck Exercises: Supine   Other Supine Exercise Sm ball under neck foe decompresion x1 min, then side to side 10x slowly, then extension 10 x slowly   Ultrasound   Ultrasound Location Lt cervical   Ultrasound Parameters 1wtcm2 100%   Ultrasound Goals Pain   Manual Therapy   Manual Therapy Soft tissue mobilization                  PT Short Term Goals - 04/28/15 1317    PT SHORT TERM GOAL #1   Title pain in cervcial  at end of day decreased >/= 25%   Time 3   Period Weeks   Status New   PT SHORT TERM GOAL #2   Title ability to hold head up while working on [employer] has improved >/= 25% greater ease   Time 3   Period Weeks   Status New           PT Long Term Goals - 04/28/15 1319    PT LONG TERM GOAL #1   Title indepentdent with HEP   Time 6   Period Weeks   Status New   PT LONG TERM GOAL #2   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 Introduced modalities today prior to doing ROM exercises. Seemed to be beneficial.    Pt will benefit from skilled therapeutic intervention in order to improve on the following deficits Decreased mobility;Decreased strength;Decreased activity tolerance;Pain;Decreased endurance;Decreased range of motion   Rehab Potential Excellent   Clinical Impairments Affecting Rehab Potential None   PT Frequency 2x / week   PT Duration 6 weeks   PT Next Visit Plan modalities as need to cervical, soft tissue work to cervical, strengthening exercises for cervical   Consulted and Agree with Plan of Care Patient        Problem List Patient Active Problem List   Diagnosis Date Noted  . Acute renal failure 03/25/2015  . Essential (primary) hypertension 03/25/2015  . Diabetes mellitus without complication 20/23/3435  . AKI (acute kidney injury) 03/25/2015    Huntley Demedeiros, PTA 04/30/2015, 9:25 AM  [facility] [address] [place], [place], [address] Phone: [phone]   Fax:  [phone]   Soft tissue work was Left  greater than right.

## 2015-05-05 ENCOUNTER — Encounter: Payer: Self-pay | Admitting: Physical Therapy

## 2015-05-05 ENCOUNTER — Ambulatory Visit: Payer: Commercial Managed Care - HMO | Admitting: Physical Therapy

## 2015-05-05 DIAGNOSIS — M436 Torticollis: Secondary | ICD-10-CM | POA: Diagnosis not present

## 2015-05-05 DIAGNOSIS — M542 Cervicalgia: Secondary | ICD-10-CM | POA: Diagnosis not present

## 2015-05-05 NOTE — Therapy (Signed)
[facility] [address] [place], [place], [address] Phone: [phone]   Fax:  [phone]  Physical Therapy Treatment  Patient Details  Name: Cassandra Allen MRN: [id number] Date of Birth: [birth year] Referring Provider:  [name], MD  Encounter Date: 05/05/2015      PT End of Session - 05/05/15 1151    Visit Number 2   Number of Visits 10  Medicare   Date for PT Re-Evaluation 06/09/15   PT Start Time 4496   PT Stop Time 1245   PT Time Calculation (min) 60 min   Activity Tolerance Patient tolerated treatment well   Behavior During Therapy [facility] for tasks assessed/performed      Past Medical History  Diagnosis Date  . Diabetes mellitus without complication   . Hypertension   . Arthritis     History reviewed. No pertinent past surgical history.  There were no vitals filed for this visit.  Visit Diagnosis:  Stiffness of cervical spine  Cervical pain (neck)      Subjective Assessment - 05/05/15 1150    Subjective My neck feels sore due to being busy at work.  Ultrasound helpedI am not able to do exercises every day.    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   Pain Relieving Factors AM   Multiple Pain Sites No            [facility] PT Assessment - 05/05/15 0001    AROM   Cervical Extension decreased by 25%   Cervical - Right Side Bend decreased by 75%   Cervical - Left Side Bend decreased by 25%   Cervical - Right Rotation decreased by 50%   Cervical - Left Rotation decreased by 25%                     [facility] Adult PT Treatment/Exercise - 05/05/15 0001    Modalities   Modalities  Electrical Stimulation;Moist Heat   Moist Heat Therapy   Number Minutes Moist Heat 20 Minutes   Moist Heat Location Cervical   Electrical Stimulation   Electrical Stimulation Location cervical   Electrical Stimulation Action IFC   Electrical Stimulation Parameters to patient tolerance   Electrical Stimulation Goals Pain   Ultrasound   Ultrasound Location left cervical   Ultrasound Parameters 1 w/cm2, 100% 1 mhz 8 min   Ultrasound Goals Pain   Manual Therapy   Manual Therapy Joint mobilization;Soft tissue mobilization;Passive ROM   Joint Mobilization C2-C7 grade 3 for sideglide, rotation,    Soft tissue mobilization ; suboccipitals; scalenes   Passive ROM Cervical ROM for retraction, bil. rotation, bil sidebending                PT Education - 05/05/15 1227    Education provided No          PT Short Term Goals - 05/05/15 1152    PT SHORT TERM GOAL #1   Title pain in cervcial at end of day decreased >/= 25%   Time 3   Period Weeks   Status On-going  no change   PT SHORT TERM GOAL #  2   Title ability to hold head up while working on [employer] has improved >/= 25% greater ease   Time 3   Period Weeks   Status On-going  No change yet           PT Long Term Goals - 04/28/15 1319    PT LONG TERM GOAL #1   Title indepentdent with HEP   Time 6   Period Weeks   Status New   PT LONG TERM GOAL #2   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  Patient has not changes in her cervical ROM.  Patient has not met goals at this time.  Patient has decreased mobiity in cervical vertebrae  making it difficult for the  to move her head.  Patient has tightness on left cervical muscles pulling her head to the left. Patient would benefit form manula mobilization and soft tissue work to improve ROM and cervical strength to hold her head up.    Pt will benefit from skilled therapeutic intervention in order to improve on the following deficits Decreased mobility;Decreased strength;Decreased activity tolerance;Pain;Decreased endurance;Decreased range of motion   Rehab Potential Excellent   Clinical Impairments Affecting Rehab Potential None   PT Frequency 2x / week   PT Duration 6 weeks   PT Treatment/Interventions Moist Heat;Cryotherapy;Electrical Stimulation;Ultrasound;Neuromuscular re-education;Therapeutic exercise;Therapeutic activities;Patient/family education;Manual techniques;Passive range of motion   PT Next Visit Plan modalities as need to cervical, soft tissue work to cervical, strengthening exercises for cervical   PT Home Exercise Plan cervical isometrics   Consulted and Agree with Plan of Care Patient        Problem List Patient Active Problem List   Diagnosis Date Noted  . Acute renal failure 03/25/2015  . Essential (primary) hypertension 03/25/2015  . Diabetes mellitus without complication 21/22/4825  . AKI (acute kidney injury) 03/25/2015    GRAY,Astaria,PT 05/05/2015, 12:31 PM  [facility] [address] [place], [place], [address] Phone: [phone]   Fax:  [phone]

## 2015-05-07 ENCOUNTER — Encounter: Payer: Commercial Managed Care - HMO | Admitting: Physical Therapy

## 2015-05-12 ENCOUNTER — Ambulatory Visit: Payer: Commercial Managed Care - HMO | Admitting: Physical Therapy

## 2015-05-12 ENCOUNTER — Encounter: Payer: Self-pay | Admitting: Physical Therapy

## 2015-05-12 DIAGNOSIS — M542 Cervicalgia: Secondary | ICD-10-CM | POA: Diagnosis not present

## 2015-05-12 DIAGNOSIS — M436 Torticollis: Secondary | ICD-10-CM

## 2015-05-12 NOTE — Therapy (Signed)
[facility] [address], [place], [place], [address] Phone: [phone]   Fax:  [phone]  Physical Therapy Treatment  Patient Details  Name: Cassandra Allen MRN: [id number] Date of Birth: 1946-08-03 Referring Provider:  [name], MD  Encounter Date: 05/12/2015      PT End of Session - 05/12/15 1302    Visit Number 3   Number of Visits 10   PT Start Time 1230   PT Stop Time 1315   PT Time Calculation (min) 45 min   Activity Tolerance Patient tolerated treatment well   Behavior During Therapy [facility] for tasks assessed/performed      Past Medical History  Diagnosis Date  . Diabetes mellitus without complication   . Hypertension   . Arthritis     History reviewed. No pertinent past surgical history.  There were no vitals filed for this visit.  Visit Diagnosis:  Stiffness of cervical spine  Cervical pain (neck)      Subjective Assessment - 05/12/15 1259    Subjective I do not have pain at the end of my day now, just discomfort.  Feels everything is helping.   Currently in Pain? No/denies   Multiple Pain Sites No                         [facility] Adult PT Treatment/Exercise - 05/12/15 0001    Neck Exercises: Supine   Cervical Isometrics Extension;Right rotation;Left rotation;3 secs   Neck Retraction 10 reps   Cervical Rotation Both;10 reps   Moist Heat Therapy   Number Minutes Moist Heat 15 Minutes   Moist Heat Location Cervical   Electrical Stimulation   Electrical Stimulation Location cervical   Electrical Stimulation Action IFC   Electrical Stimulation Goals Pain   Ultrasound   Ultrasound Location Lt cervical   Ultrasound Parameters 1.2wt/cm2 100%   Ultrasound Goals Pain   Manual Therapy   Manual Therapy Soft tissue mobilization   Manual therapy comments LT cervical                  PT Short Term Goals - 05/12/15 1304    PT SHORT TERM GOAL #1   Title  pain in cervcial at end of day decreased >/= 25%   Time 3   Period Weeks   Status Achieved   PT SHORT TERM GOAL #2   Title ability to hold head up while working on [employer] has improved >/= 25% greater ease   Time 3   Period Weeks   Status On-going  30% improved           PT Long Term Goals - 04/28/15 1319    PT LONG TERM GOAL #1   Title indepentdent with HEP   Time 6   Period Weeks   Status New   PT LONG TERM GOAL #2   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 She feels her neck "moves better" throughout the day.    Pt will benefit from skilled therapeutic intervention in order to improve on the following deficits Decreased mobility;Decreased strength;Decreased activity tolerance;Pain;Decreased endurance;Decreased range of motion   Rehab Potential Excellent   PT Frequency 2x / week   PT Duration 6 weeks   PT Treatment/Interventions Moist Heat;Cryotherapy;Electrical Stimulation;Ultrasound;Neuromuscular re-education;Therapeutic exercise;Therapeutic activities;Patient/family education;Manual techniques;Passive range of motion   PT Next Visit Plan modalities as need to cervical, soft tissue work to cervical, strengthening exercises for cervical   Consulted and Agree with Plan of Care Patient        Problem List Patient Active Problem List   Diagnosis Date Noted  . Acute renal failure 03/25/2015  . Essential (primary) hypertension 03/25/2015  . Diabetes mellitus without complication 41/63/8453  . AKI (acute kidney injury) 03/25/2015    Aneisa Karren, PTA 05/12/2015, 1:06 PM  [facility] [address], [place], [place], [address] Phone: [phone]   Fax:  [phone]

## 2015-05-19 ENCOUNTER — Encounter: Payer: Self-pay | Admitting: Physical Therapy

## 2015-05-19 ENCOUNTER — Ambulatory Visit: Payer: Commercial Managed Care - HMO | Attending: Family Medicine | Admitting: Physical Therapy

## 2015-05-19 DIAGNOSIS — M542 Cervicalgia: Secondary | ICD-10-CM | POA: Diagnosis not present

## 2015-05-19 DIAGNOSIS — M436 Torticollis: Secondary | ICD-10-CM | POA: Diagnosis not present

## 2015-05-19 NOTE — Therapy (Signed)
[facility] [address] [place], [place], [address] Phone: [phone]   Fax:  [phone]  Physical Therapy Treatment  Patient Details  Name: Clair Bardwell MRN: [id number] Date of Birth: [birth year] Referring Provider:  [name], MD  Encounter Date: 05/19/2015      PT End of Session - 05/19/15 1306    Visit Number 4   Number of Visits 10  medicare   Date for PT Re-Evaluation 06/09/15   PT Start Time 1230   PT Stop Time 1318   PT Time Calculation (min) 48 min   Activity Tolerance Patient tolerated treatment well   Behavior During Therapy [facility] for tasks assessed/performed      Past Medical History  Diagnosis Date  . Diabetes mellitus without complication   . Hypertension   . Arthritis     History reviewed. No pertinent past surgical history.  There were no vitals filed for this visit.  Visit Diagnosis:  Stiffness of cervical spine  Cervical pain (neck)      Subjective Assessment - 05/19/15 1236    Subjective I do not have pain at the end of my day now, just discomfort.  Feels everything is helping.   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 No/denies            [facility] PT Assessment - 05/19/15 0001    AROM   Cervical Extension full   Cervical - Right Side Bend decreased by 50%   Cervical - Left Side Bend full   Cervical - Right Rotation decreased by 50%   Cervical - Left Rotation decreased by 25%                     [facility] Adult PT Treatment/Exercise - 05/19/15 0001    Neck Exercises: Seated   Cervical Isometrics Extension  press head into ball hold 5 sec 10x   Shoulder Flexion Both;10 reps  with head press into ball, 2 sets   Upper Extremity D2 Theraband;10 reps  2 sets both ways   Theraband Level (UE D2) Level 2 (Red)   UE D2 Limitations head pressed into red ball   Other Seated Exercise horizontal abuction with red band  and press head into red ball   10 x 2   Moist Heat Therapy   Number Minutes Moist Heat 15 Minutes   Moist Heat Location Cervical   Electrical Stimulation   Electrical Stimulation Location cervical   Electrical Stimulation Action IFC   Electrical Stimulation Parameters to patients tolerance   Electrical Stimulation Goals Pain                PT Education - 05/19/15 1305    Education provided Yes   Education Details sitting interscapular strengthening with head press into ball   Person(s) Educated Patient   Methods Explanation;Demonstration;Tactile cues;Verbal cues;Handout   Comprehension Returned demonstration;Verbalized understanding          PT Short Term Goals - 05/19/15 1236    PT SHORT TERM GOAL #2   Title ability to hold head up while working on [employer] has improved >/= 25% greater ease   Time 3   Period Days   Status Achieved           PT Long Term Goals - 05/19/15 1237    PT LONG TERM GOAL #1   Title indepentdent with HEP   Time 6   Period  Weeks   Status On-going  still learning exercises   PT LONG TERM GOAL #2   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 Patient has met all of her STG's.  Patient is learning to strengthen her cervical musculature in upright position. Patient reports she does not have to hold her head up with her hands anymore. Patient has improved her cervical ROM by 25%. Patient is benefiting from physical therapy to improve cervical strength and ROM.    Pt will benefit from skilled therapeutic  intervention in order to improve on the following deficits Decreased mobility;Decreased strength;Decreased activity tolerance;Pain;Decreased endurance;Decreased range of motion   Rehab Potential Excellent   Clinical Impairments Affecting Rehab Potential None   PT Frequency 2x / week   PT Duration 6 weeks   PT Treatment/Interventions Moist Heat;Cryotherapy;Electrical Stimulation;Ultrasound;Neuromuscular re-education;Therapeutic exercise;Therapeutic activities;Patient/family education;Manual techniques;Passive range of motion   PT Next Visit Plan modalities as need to cervical, soft tissue work to cervical, strengthening exercises for cervical in upright position   PT Home Exercise Plan progress as needed   Consulted and Agree with Plan of Care Patient        Problem List Patient Active Problem List   Diagnosis Date Noted  . Acute renal failure 03/25/2015  . Essential (primary) hypertension 03/25/2015  . Diabetes mellitus without complication 24/79/9800  . AKI (acute kidney injury) 03/25/2015    GRAY,Jaquelynn,PT 05/19/2015, 1:10 PM  [facility] [address], [place], [place], [address] Phone: [phone]   Fax:  [phone]

## 2015-05-19 NOTE — Patient Instructions (Signed)
  PNF Strengthening: Resisted   Sit with head being pressed into pillow or ball, with resistive band around each hand, bring right arm up and away, thumb back. Repeat _10___ times per set. Do _2___ sets per session. Do _1-2___ sessions per day.      Resisted Horizontal Abduction: Bilateral   Sit while pressing head into ball or pillow, tubing in both hands, arms out in front. Keeping arms straight, pinch shoulder blades together and stretch arms out. Repeat _10___ times per set. Do 2____ sets per session. Do _1-2___ sessions per day.                  Scapular Retraction: Elbow Flexion (Standing)   Sit with head pressed into ball or pillow, red band in hands. With elbows bent to 90, pinch shoulder blades together and rotate arms out, keeping elbows bent. Repeat _10___ times per set. Do _2___ sets per session. Do many__1__ sessions per day.  [facility] [address] [place], [place] [address] Phone # [phone] Fax [phone]

## 2015-05-21 ENCOUNTER — Encounter: Payer: Commercial Managed Care - HMO | Admitting: Physical Therapy

## 2015-05-26 ENCOUNTER — Encounter: Payer: Self-pay | Admitting: Physical Therapy

## 2015-05-26 ENCOUNTER — Ambulatory Visit: Payer: Commercial Managed Care - HMO | Admitting: Physical Therapy

## 2015-05-26 DIAGNOSIS — M542 Cervicalgia: Secondary | ICD-10-CM | POA: Diagnosis not present

## 2015-05-26 DIAGNOSIS — M436 Torticollis: Secondary | ICD-10-CM | POA: Diagnosis not present

## 2015-05-26 NOTE — Therapy (Signed)
[facility] [address] [place], [place], [address] Phone: [phone]   Fax:  [phone]  Physical Therapy Treatment  Patient Details  Name: Cassandra Allen MRN: [id number] Date of Birth: [birth year] Referring Provider:  [name], MD  Encounter Date: 05/26/2015      PT End of Session - 05/26/15 1301    Visit Number 5   Number of Visits 10   Date for PT Re-Evaluation 06/09/15   PT Start Time 6962   PT Stop Time 9528   PT Time Calculation (min) 47 min   Activity Tolerance Patient tolerated treatment well   Behavior During Therapy [facility] for tasks assessed/performed      Past Medical History  Diagnosis Date  . Diabetes mellitus without complication   . Hypertension   . Arthritis     History reviewed. No pertinent past surgical history.  There were no vitals filed for this visit.  Visit Diagnosis:  Stiffness of cervical spine  Cervical pain (neck)      Subjective Assessment - 05/26/15 1234    Subjective Still better every week. Not having to hold her head up, has a little pinch today.   Pain Score 2    Pain Location Neck   Pain Orientation Left;Posterior   Pain Descriptors / Indicators --  Pinching   Pain Type Acute pain   Aggravating Factors  End of work day   Pain Relieving Factors in AM   Multiple Pain Sites No                         [facility] Adult PT Treatment/Exercise - 05/26/15 0001    Neck Exercises: Seated   Upper Extremity D2 10 reps;Theraband  2x 10   Theraband Level (UE D2) Level 2 (Red)   UE D2 Limitations head pressed into red ball  10x then 10 with horizontal abduction red band   Other Seated Exercise horizontal abduction seated 2x10   Neck Exercises: Supine   Other Supine Exercise head press into ball with shoulder circles 10 x each direction   Moist Heat Therapy   Number Minutes Moist Heat 15 Minutes   Moist Heat Location Cervical   Electrical  Stimulation   Electrical Stimulation Location cervical   Electrical Stimulation Action IFC   Electrical Stimulation Goals Pain   Manual Therapy   Manual Therapy Soft tissue mobilization   Manual therapy comments Lt cervical                  PT Short Term Goals - 05/19/15 1236    PT SHORT TERM GOAL #2   Title ability to hold head up while working on [employer] has improved >/= 25% greater ease   Time 3   Period Days   Status Achieved           PT Long Term Goals - 05/26/15 1305    PT LONG TERM GOAL #1   Title indepentdent with HEP   Time 6   Period Weeks   Status On-going   PT LONG TERM GOAL #2   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 She is compliant with her HEP and noticably holds her head more neutral vs with a LT lean.   Pt will benefit from skilled therapeutic intervention in order to improve on the following deficits Decreased mobility;Decreased strength;Decreased activity tolerance;Pain;Decreased endurance;Decreased range of motion   Rehab Potential Excellent   Clinical Impairments Affecting Rehab Potential None   PT Frequency 2x / week   PT Duration 6 weeks   PT Treatment/Interventions Moist Heat;Cryotherapy;Electrical Stimulation;Ultrasound;Neuromuscular re-education;Therapeutic exercise;Therapeutic activities;Patient/family education;Manual techniques;Passive range of motion   PT Next Visit Plan modalities as need to cervical, soft tissue work to cervical, strengthening exercises for cervical in upright position    Consulted and Agree with Plan of Care Patient        Problem List Patient Active Problem List   Diagnosis Date Noted  . Acute renal failure 03/25/2015  . Essential (primary) hypertension 03/25/2015  . Diabetes mellitus without complication 48/54/6270  . AKI (acute kidney injury) 03/25/2015    Marvelyn Bouchillon, PTA 05/26/2015, 1:07 PM  [facility] [address] [place], [place], [address] Phone: [phone]   Fax:  [phone]

## 2015-05-28 ENCOUNTER — Ambulatory Visit: Payer: Commercial Managed Care - HMO | Admitting: Physical Therapy

## 2015-05-28 ENCOUNTER — Encounter: Payer: Self-pay | Admitting: Physical Therapy

## 2015-05-28 DIAGNOSIS — M436 Torticollis: Secondary | ICD-10-CM

## 2015-05-28 DIAGNOSIS — M542 Cervicalgia: Secondary | ICD-10-CM | POA: Diagnosis not present

## 2015-05-28 NOTE — Therapy (Signed)
[facility] [address], [place], [place], [address] Phone: [phone]   Fax:  [phone]  Physical Therapy Treatment  Patient Details  Name: Cassandra Allen MRN: [id number] Date of Birth: 1945-12-13 Referring Provider:  [name], MD  Encounter Date: 05/28/2015      PT End of Session - 05/28/15 1033    Visit Number 6   Number of Visits 10   Date for PT Re-Evaluation 06/09/15   PT Start Time 1013   PT Stop Time 1100   PT Time Calculation (min) 47 min   Activity Tolerance Patient tolerated treatment well   Behavior During Therapy [facility] for tasks assessed/performed      Past Medical History  Diagnosis Date  . Diabetes mellitus without complication   . Hypertension   . Arthritis     History reviewed. No pertinent past surgical history.  There were no vitals filed for this visit.  Visit Diagnosis:  Stiffness of cervical spine  Cervical pain (neck)      Subjective Assessment - 05/28/15 1015    Subjective Worked yesterday and reports feeling not as tired inher head and upper back. Today she doesn't feel great, her mid back is 'bothering" her.    Currently in Pain? Yes   Pain Score 2    Pain Location Back   Pain Orientation Mid   Pain Descriptors / Indicators Aching                         [facility] Adult PT Treatment/Exercise - 05/28/15 0001    Neck Exercises: Machines for Strengthening   UBE (Upper Arm Bike) Lo 2x2 with focus on posture   Neck Exercises: Theraband   Horizontal ADduction 20 reps;Red   Other Theraband Exercises EXT Rot 2 x10    Neck Exercises: Standing   Neck Retraction 20 reps;3 secs  Purple ball into wall, VC for alignment   Other Standing Exercises Added yellow band diagonals to head press 10 each side   Neck Exercises: Seated   Other Seated Exercise 3 way raise 1# 10x2  each bil alternating  VC for head alignment   Moist Heat Therapy   Number Minutes  Moist Heat 15 Minutes   Moist Heat Location Cervical   Electrical Stimulation   Electrical Stimulation Location cervical   Electrical Stimulation Action IFC   Electrical Stimulation Goals Pain                  PT Short Term Goals - 05/19/15 1236    PT SHORT TERM GOAL #2   Title ability to hold head up while working on [employer] has improved >/= 25% greater ease   Time 3   Period Days   Status Achieved           PT Long Term Goals - 05/26/15 1305    PT LONG TERM GOAL #1   Title indepentdent with HEP   Time 6   Period Weeks   Status On-going   PT LONG TERM GOAL #2   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 All in standing or sitting having to hold her head up against gravity. She did requires some verbal cuing to get back to neutral cervical .    Pt will benefit from skilled therapeutic intervention in order to improve on the following deficits Decreased mobility;Decreased strength;Decreased activity tolerance;Pain;Decreased endurance;Decreased range of motion   Rehab Potential Excellent   Clinical Impairments Affecting Rehab Potential None   PT Frequency 2x / week   PT Duration 6 weeks   PT Treatment/Interventions Moist Heat;Cryotherapy;Electrical Stimulation;Ultrasound;Neuromuscular re-education;Therapeutic exercise;Therapeutic activities;Patient/family education;Manual techniques;Passive range of motion   PT Next Visit Plan Cervical, shoulder, mid back strength   Consulted and Agree with Plan of Care Patient        Problem List Patient Active Problem List   Diagnosis Date Noted  . Acute renal failure  03/25/2015  . Essential (primary) hypertension 03/25/2015  . Diabetes mellitus without complication 20/81/3887  . AKI (acute kidney injury) 03/25/2015    Monzerrat Wellen, PTA 05/28/2015, 10:47 AM  [facility] [address], [place], [place], [address] Phone: [phone]   Fax:  [phone]

## 2015-06-02 ENCOUNTER — Encounter: Payer: Self-pay | Admitting: Physical Therapy

## 2015-06-04 ENCOUNTER — Encounter: Payer: Commercial Managed Care - HMO | Admitting: Physical Therapy

## 2015-06-09 ENCOUNTER — Encounter: Payer: Self-pay | Admitting: Physical Therapy

## 2015-06-09 ENCOUNTER — Ambulatory Visit: Payer: Commercial Managed Care - HMO | Admitting: Physical Therapy

## 2015-06-09 DIAGNOSIS — M542 Cervicalgia: Secondary | ICD-10-CM

## 2015-06-09 DIAGNOSIS — M436 Torticollis: Secondary | ICD-10-CM | POA: Diagnosis not present

## 2015-06-09 NOTE — Therapy (Signed)
[facility] [address], [place], [place], [address] Phone: [phone]   Fax:  [phone]  Physical Therapy Treatment  Patient Details  Name: Cassandra Allen MRN: [id number] Date of Birth: [birth year] Referring Provider:  [name], MD  Encounter Date: 06/09/2015      PT End of Session - 06/09/15 1451    Visit Number 7   Date for PT Re-Evaluation 06/09/15   PT Start Time 8882   PT Stop Time 1516   PT Time Calculation (min) 31 min   Activity Tolerance Patient tolerated treatment well   Behavior During Therapy [facility] for tasks assessed/performed      Past Medical History  Diagnosis Date  . Diabetes mellitus without complication   . Hypertension   . Arthritis     History reviewed. No pertinent past surgical history.  There were no vitals filed for this visit.  Visit Diagnosis:  Stiffness of cervical spine  Cervical pain (neck)          [facility] PT Assessment - 06/09/15 0001    Observation/Other Assessments   Focus on Therapeutic Outcomes (FOTO)  30% limitation   AROM   Cervical Extension full   Cervical - Right Side Bend full   Cervical - Left Side Bend full   Cervical - Right Rotation full   Cervical - Left Rotation full   Strength   Overall Strength Comments cervical strength is 5/5                     [facility] Adult PT Treatment/Exercise - 06/09/15 0001    Neck Exercises: Seated   Shoulder ABduction 20 reps;Both  both ways horzontal abduction green band   Upper Extremity D2 20 reps;Theraband   Theraband Level (UE D2) Level 3 (Green)   Other Seated Exercise bil. shoulder extension with green band 20 x                PT Education - 06/09/15 1516    Education provided Yes   Education Details scapular strengthening with green band   Person(s) Educated Patient   Methods Demonstration;Tactile cues;Verbal cues;Explanation;Handout   Comprehension Returned  demonstration;Verbalized understanding          PT Short Term Goals - 05/19/15 1236    PT SHORT TERM GOAL #2   Title ability to hold head up while working on [employer] has improved >/= 25% greater ease   Time 3   Period Days   Status Achieved           PT Long Term Goals - 06/09/15 1452    PT LONG TERM GOAL #1   Title indepentdent with HEP   Period Weeks   Status Achieved   PT LONG TERM GOAL #2   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  Patient reports her pain and ability to hold her head up is 85% better. FOTO score  has improve to 30% limitation. Patient has full Cervical ROM.  Cervical strength is 5/5. Patient is independent with HEP. Patient is ready for discharge.    Pt will benefit from skilled therapeutic intervention in order to improve on the following deficits Decreased mobility;Decreased strength;Decreased activity tolerance;Pain;Decreased endurance;Decreased range of motion   Rehab Potential Excellent   Clinical Impairments Affecting Rehab Potential None   PT Treatment/Interventions Moist Heat;Cryotherapy;Electrical Stimulation;Ultrasound;Neuromuscular re-education;Therapeutic exercise;Therapeutic activities;Patient/family education;Manual techniques;Passive range of motion   PT Next Visit Plan discharge to HEP   PT Home Exercise Plan Current HEP   Recommended Other Services None   Consulted and Agree with Plan of Care Patient          G-Codes - 06/17/2015 1457    Functional Assessment Tool Used FOTO score is 30% limitaiton   Functional Limitation Carrying, moving and handling  objects   Carrying, Moving and Handling Objects Goal Status ([id number]) At least 20 percent but less than 40 percent impaired, limited or restricted   Carrying, Moving and Handling Objects Discharge Status [phone]) At least 20 percent but less than 40 percent impaired, limited or restricted      Problem List Patient Active Problem List   Diagnosis Date Noted  . Acute renal failure 03/25/2015  . Essential (primary) hypertension 03/25/2015  . Diabetes mellitus without complication 33/91/7921  . AKI (acute kidney injury) 03/25/2015    GRAY,Shanelle,PT 06-17-2015, 3:20 PM  [facility] [address] [place], [place], [address] Phone: [phone]   Fax:  [phone]  PHYSICAL THERAPY DISCHARGE SUMMARY  Visits from Start of Care: 7 Current functional level related to goals / functional outcomes: See above.  Met all of her goals.    Remaining deficits: None  Education / Equipment: HEP  Plan: Patient agrees to discharge.  Patient goals were met. Patient is being discharged due to meeting the stated rehab goals.  Thank you for the referral. Darcia Lampi, PT 06-17-15 3:20 PM  ?????

## 2015-06-09 NOTE — Patient Instructions (Addendum)
  PNF Strengthening: Resisted   Standing with resistive band around each hand, bring right arm up and away, thumb back. Repeat _10___ times per set. Do _2___ sets per session. Do _1-2___ sessions per day.      Resisted Horizontal Abduction: Bilateral   Sit or stand, tubing in both hands, arms out in front. Keeping arms straight, pinch shoulder blades together and stretch arms out. Repeat _10___ times per set. Do 2____ sets per session. Do _1-2___ sessions per day.        Strengthening: Resisted Extension   Hold tubing in right hand, arm forward. Pull arm back, elbow straight. Repeat _10___ times per set. Do _2___ sets per session. Do _1-2___ sessions per day.  Can place band around the front of your body and perform with both arms at the same time.  Neck: Retraction   Sit with back straight or lie on firm surface. Place hands, with fingers locked, behind head. Keep head in neutral position. Push back with head while resisting with hands. Do not let head actually move. Hold __5__ seconds. Repeat _5___ times. Do __2__ sessions per day. CAUTION: Pressure should be steady.  Copyright  VHI. All rights reserved.  [facility] [address] [place], [place] [address] Phone # [phone] Fax [phone]

## 2015-07-16 DIAGNOSIS — F322 Major depressive disorder, single episode, severe without psychotic features: Secondary | ICD-10-CM | POA: Diagnosis not present

## 2015-07-16 DIAGNOSIS — D509 Iron deficiency anemia, unspecified: Secondary | ICD-10-CM | POA: Diagnosis not present

## 2015-07-16 DIAGNOSIS — N183 Chronic kidney disease, stage 3 (moderate): Secondary | ICD-10-CM | POA: Diagnosis not present

## 2015-07-16 DIAGNOSIS — E1165 Type 2 diabetes mellitus with hyperglycemia: Secondary | ICD-10-CM | POA: Diagnosis not present

## 2015-07-16 DIAGNOSIS — E785 Hyperlipidemia, unspecified: Secondary | ICD-10-CM | POA: Diagnosis not present

## 2015-07-16 DIAGNOSIS — M25569 Pain in unspecified knee: Secondary | ICD-10-CM | POA: Diagnosis not present

## 2015-08-20 DIAGNOSIS — H5210 Myopia, unspecified eye: Secondary | ICD-10-CM | POA: Diagnosis not present

## 2015-08-20 DIAGNOSIS — H524 Presbyopia: Secondary | ICD-10-CM | POA: Diagnosis not present

## 2015-08-20 DIAGNOSIS — Z0100 Encounter for examination of eyes and vision without abnormal findings: Secondary | ICD-10-CM | POA: Diagnosis not present

## 2016-02-04 DIAGNOSIS — D509 Iron deficiency anemia, unspecified: Secondary | ICD-10-CM | POA: Diagnosis not present

## 2016-02-04 DIAGNOSIS — M5432 Sciatica, left side: Secondary | ICD-10-CM | POA: Diagnosis not present

## 2016-02-04 DIAGNOSIS — N183 Chronic kidney disease, stage 3 (moderate): Secondary | ICD-10-CM | POA: Diagnosis not present

## 2016-02-04 DIAGNOSIS — I10 Essential (primary) hypertension: Secondary | ICD-10-CM | POA: Diagnosis not present

## 2016-02-04 DIAGNOSIS — E1121 Type 2 diabetes mellitus with diabetic nephropathy: Secondary | ICD-10-CM | POA: Diagnosis not present

## 2016-02-04 DIAGNOSIS — E785 Hyperlipidemia, unspecified: Secondary | ICD-10-CM | POA: Diagnosis not present

## 2016-02-04 DIAGNOSIS — Z1159 Encounter for screening for other viral diseases: Secondary | ICD-10-CM | POA: Diagnosis not present

## 2016-02-13 DIAGNOSIS — R197 Diarrhea, unspecified: Secondary | ICD-10-CM | POA: Diagnosis not present

## 2016-02-14 DIAGNOSIS — E1165 Type 2 diabetes mellitus with hyperglycemia: Secondary | ICD-10-CM | POA: Diagnosis not present

## 2016-02-14 DIAGNOSIS — R197 Diarrhea, unspecified: Secondary | ICD-10-CM | POA: Diagnosis not present

## 2016-02-14 DIAGNOSIS — N179 Acute kidney failure, unspecified: Secondary | ICD-10-CM | POA: Diagnosis not present

## 2016-02-14 DIAGNOSIS — E860 Dehydration: Secondary | ICD-10-CM | POA: Diagnosis not present

## 2016-02-14 DIAGNOSIS — E1121 Type 2 diabetes mellitus with diabetic nephropathy: Secondary | ICD-10-CM | POA: Diagnosis not present

## 2016-03-10 DIAGNOSIS — M79673 Pain in unspecified foot: Secondary | ICD-10-CM | POA: Diagnosis not present

## 2016-03-10 DIAGNOSIS — R5383 Other fatigue: Secondary | ICD-10-CM | POA: Diagnosis not present

## 2016-03-10 DIAGNOSIS — K219 Gastro-esophageal reflux disease without esophagitis: Secondary | ICD-10-CM | POA: Diagnosis not present

## 2016-03-10 DIAGNOSIS — E1121 Type 2 diabetes mellitus with diabetic nephropathy: Secondary | ICD-10-CM | POA: Diagnosis not present

## 2016-03-10 DIAGNOSIS — M5432 Sciatica, left side: Secondary | ICD-10-CM | POA: Diagnosis not present

## 2016-03-27 ENCOUNTER — Emergency Department (HOSPITAL_COMMUNITY)
Admission: EM | Admit: 2016-03-27 | Discharge: 2016-03-27 | Disposition: A | Discharge status: Home / Self Care | Payer: Commercial Managed Care - HMO | Attending: Emergency Medicine | Admitting: Emergency Medicine

## 2016-03-27 ENCOUNTER — Emergency Department (HOSPITAL_COMMUNITY): Payer: Commercial Managed Care - HMO

## 2016-03-27 ENCOUNTER — Encounter (HOSPITAL_COMMUNITY): Payer: Self-pay | Admitting: Emergency Medicine

## 2016-03-27 DIAGNOSIS — I499 Cardiac arrhythmia, unspecified: Secondary | ICD-10-CM | POA: Diagnosis not present

## 2016-03-27 DIAGNOSIS — N179 Acute kidney failure, unspecified: Secondary | ICD-10-CM | POA: Diagnosis not present

## 2016-03-27 DIAGNOSIS — I4891 Unspecified atrial fibrillation: Secondary | ICD-10-CM | POA: Diagnosis not present

## 2016-03-27 DIAGNOSIS — I10 Essential (primary) hypertension: Secondary | ICD-10-CM | POA: Diagnosis not present

## 2016-03-27 DIAGNOSIS — Z79899 Other long term (current) drug therapy: Secondary | ICD-10-CM | POA: Insufficient documentation

## 2016-03-27 DIAGNOSIS — Z8739 Personal history of other diseases of the musculoskeletal system and connective tissue: Secondary | ICD-10-CM | POA: Insufficient documentation

## 2016-03-27 DIAGNOSIS — E119 Type 2 diabetes mellitus without complications: Secondary | ICD-10-CM | POA: Diagnosis not present

## 2016-03-27 DIAGNOSIS — Z792 Long term (current) use of antibiotics: Secondary | ICD-10-CM | POA: Insufficient documentation

## 2016-03-27 DIAGNOSIS — E1121 Type 2 diabetes mellitus with diabetic nephropathy: Secondary | ICD-10-CM | POA: Diagnosis not present

## 2016-03-27 DIAGNOSIS — Z794 Long term (current) use of insulin: Secondary | ICD-10-CM | POA: Diagnosis not present

## 2016-03-27 LAB — CBC
HCT: 36.9 % (ref 36.0–46.0)
Hemoglobin: 11.4 g/dL — ABNORMAL LOW (ref 12.0–15.0)
MCH: 28.1 pg (ref 26.0–34.0)
MCHC: 30.9 g/dL (ref 30.0–36.0)
MCV: 90.9 fL (ref 78.0–100.0)
Platelets: 265 10*3/uL (ref 150–400)
RBC: 4.06 MIL/uL (ref 3.87–5.11)
RDW: 15.1 % (ref 11.5–15.5)
WBC: 8.8 10*3/uL (ref 4.0–10.5)

## 2016-03-27 LAB — BASIC METABOLIC PANEL
Anion gap: 12 (ref 5–15)
BUN: 37 mg/dL — AB (ref 6–20)
CO2: 21 mmol/L — ABNORMAL LOW (ref 22–32)
Calcium: 9.4 mg/dL (ref 8.9–10.3)
Chloride: 108 mmol/L (ref 101–111)
Creatinine, Ser: 1.14 mg/dL — ABNORMAL HIGH (ref 0.44–1.00)
GFR calc Af Amer: 56 mL/min — ABNORMAL LOW (ref >60)
GFR, EST NON AFRICAN AMERICAN: 48 mL/min — AB (ref >60)
GLUCOSE: 163 mg/dL — AB (ref 65–99)
Potassium: 4.5 mmol/L (ref 3.5–5.1)
SODIUM: 141 mmol/L (ref 135–145)

## 2016-03-27 LAB — I-STAT TROPONIN, ED: TROPONIN I, POC: 0.01 ng/mL (ref 0.00–0.08)

## 2016-03-27 MED ORDER — DILTIAZEM HCL 30 MG PO TABS
30.0000 mg | ORAL_TABLET | Freq: Once | ORAL | Status: AC
  Administered 2016-03-27: 30 mg via ORAL
  Filled 2016-03-27: qty 1

## 2016-03-27 MED ORDER — APIXABAN 5 MG PO TABS: 5.0000 mg | ORAL_TABLET | Freq: Two times a day (BID) | ORAL | Status: DC

## 2016-03-27 MED ORDER — APIXABAN (ELIQUIS) EDUCATION KIT FOR DVT/PE PATIENTS: PACK | Freq: Once | Status: DC

## 2016-03-27 MED ORDER — DILTIAZEM HCL ER COATED BEADS 180 MG PO CP24: 180.0000 mg | ORAL_CAPSULE | Freq: Every day | ORAL | Status: DC

## 2016-03-27 NOTE — Discharge Instructions (Addendum)
Information on my medicine - ELIQUIS (apixaban)  This medication education was reviewed with me or my healthcare representative as part of my discharge preparation.  The pharmacist that spoke with me during my hospital stay was:  Rebecka Apley, [facility]  Why was Eliquis prescribed for you? Eliquis was prescribed for you to reduce the risk of a blood clot forming that can cause a stroke if you have a medical condition called atrial fibrillation (a type of irregular heartbeat).  What do You need to know about Eliquis ? Take your Eliquis TWICE DAILY - one tablet in the morning and one tablet in the evening with or without food. If you have difficulty swallowing the tablet whole please discuss with your pharmacist how to take the medication safely.  Take Eliquis exactly as prescribed by your doctor and DO NOT stop taking Eliquis without talking to the doctor who prescribed the medication.  Stopping may increase your risk of developing a stroke.  Refill your prescription before you run out.  After discharge, you should have regular check-up appointments with your healthcare provider that is prescribing your Eliquis.  In the future your dose may need to be changed if your kidney function or weight changes by a significant amount or as you get older.  What do you do if you miss a dose? If you miss a dose, take it as soon as you remember on the same day and resume taking twice daily.  Do not take more than one dose of ELIQUIS at the same time to make up a missed dose.  Important Safety Information A possible side effect of Eliquis is bleeding. You should call your healthcare provider right away if you experience any of the following: ? Bleeding from an injury or your nose that does not stop. ? Unusual colored urine (red or dark brown) or unusual colored stools (red or black). ? Unusual bruising for unknown reasons. ? A serious fall or if you hit your head (even if there is no bleeding).  Some  medicines may interact with Eliquis and might increase your risk of bleeding or clotting while on Eliquis. To help avoid this, consult your healthcare provider or pharmacist prior to using any new prescription or non-prescription medications, including herbals, vitamins, non-steroidal anti-inflammatory drugs (NSAIDs) and supplements.  This website has more information on Eliquis (apixaban): http://www.eliquis.com/eliquis/home Atrial Fibrillation Atrial fibrillation is a type of heartbeat that is irregular or fast (rapid). If you have this condition, your heart keeps quivering in a weird (chaotic) way. This condition can make it so your heart cannot pump blood normally. Having this condition gives a person more risk for stroke, heart failure, and other heart problems. There are different types of atrial fibrillation. Talk with your doctor to learn about the type that you have. HOME CARE  Take over-the-counter and prescription medicines only as told by your doctor.  If your doctor prescribed a blood-thinning medicine, take it exactly as told. Taking too much of it can cause bleeding. If you do not take enough of it, you will not have the protection that you need against stroke and other problems.  Do not use any tobacco products. These include cigarettes, chewing tobacco, and e-cigarettes. If you need help quitting, ask your doctor.  If you have apnea (obstructive sleep apnea), manage it as told by your doctor.  Do not drink alcohol.  Do not drink beverages that have caffeine. These include coffee, soda, and tea.  Maintain a healthy weight. Do not  use diet pills unless your doctor says they are safe for you. Diet pills may make heart problems worse.  Follow diet instructions as told by your doctor.  Exercise regularly as told by your doctor.  Keep all follow-up visits as told by your doctor. This is important. GET HELP IF:  You notice a change in the speed, rhythm, or strength of your  heartbeat.  You are taking a blood-thinning medicine and you notice more bruising.  You get tired more easily when you move or exercise. GET HELP RIGHT AWAY IF:  You have pain in your chest or your belly (abdomen).  You have sweating or weakness.  You feel sick to your stomach (nauseous).  You notice blood in your throw up (vomit), poop (stool), or pee (urine).  You are short of breath.  You suddenly have swollen feet and ankles.  You feel dizzy.  Your suddenly get weak or numb in your face, arms, or legs, especially if it happens on one side of your body.  You have trouble talking, trouble understanding, or both.  Your face or your eyelid droops on one side. These symptoms may be an emergency. Do not wait to see if the symptoms will go away. Get medical help right away. Call your local emergency services (911 in the [place] Do not drive yourself to the hospital.   This information is not intended to replace advice given to you by your health care provider. Make sure you discuss any questions you have with your health care provider.   Document Released: 08/08/2008 Document Revised: 07/21/2015 Document Reviewed: 02/24/2015 Elsevier Interactive Patient Education [employer].

## 2016-03-27 NOTE — ED Provider Notes (Signed)
CSN: [id number]     Arrival date & time 03/27/16  1318 History   First MD Initiated Contact with Patient 03/27/16 1346     Chief Complaint  Patient presents with  . Atrial Fibrillation     (Consider location/radiation/quality/duration/timing/severity/associated sxs/prior Treatment) Patient is a 70 y.o. female presenting with atrial fibrillation.  Atrial Fibrillation   70 year old female history of diabetes and hypertension who presents today from primary care office. She went in today to have a preoperative check. She has had no complaints. In her primary care physician's office she was noted to be in atrial fibrillation with a rapid ventricular response and was sent into the ED secondary to this. She denies any palpitations, chest pain, weakness, or lightheadedness. She has no previous history of same. She has no idea of what Past Medical History  Diagnosis Date  . Diabetes mellitus without complication ([place])   . Hypertension   . Arthritis    History reviewed. No pertinent past surgical history. No family history on file. Social History  Substance Use Topics  . Smoking status: Never Smoker   . Smokeless tobacco: None  . Alcohol Use: No   OB History    No data available     Review of Systems  All other systems reviewed and are negative.     Allergies  Metformin and related and Prednisone  Home Medications   Prior to Admission medications   Medication Sig Start Date End Date Taking? Authorizing Provider  cephALEXin (KEFLEX) 500 MG capsule Take 1 capsule (500 mg total) by mouth 2 (two) times daily. 03/27/15   Hosie Poisson, MD  cyclobenzaprine (FLEXERIL) 10 MG tablet Take 10 mg by mouth 3 (three) times daily as needed. 03/25/15   Historical Provider, MD  folic acid (FOLVITE) 1 MG tablet Take 1 tablet (1 mg total) by mouth daily. 03/27/15   Hosie Poisson, MD  GLIPIZIDE XL 5 MG 24 hr tablet Take 5 mg by mouth daily. 01/06/15   Historical Provider, MD  loperamide (IMODIUM) 2 MG  capsule Take 2-4 mg by mouth 4 (four) times daily as needed for diarrhea or loose stools.    Historical Provider, MD  oxybutynin (DITROPAN) 5 MG tablet Take 5 mg by mouth 3 (three) times daily. 12/26/14   Historical Provider, MD  SB LOW DOSE ASA EC 81 MG EC tablet Take 81 mg by mouth daily. 01/15/15   Historical Provider, MD  sertraline (ZOLOFT) 50 MG tablet Take 50 mg by mouth daily. 03/11/15   Historical Provider, MD  simethicone (MYLICON) 80 MG chewable tablet Chew 80-160 mg by mouth 2 (two) times daily as needed. 02/23/15   Historical Provider, MD  simvastatin (ZOCOR) 20 MG tablet Take 20 mg by mouth every evening.  02/28/15   Historical Provider, MD  thiamine 100 MG tablet Take 1 tablet (100 mg total) by mouth daily. 03/27/15   Hosie Poisson, MD   BP 139/80 mmHg  Pulse 101  Temp(Src) 98.7 F (37.1 C) (Oral)  Resp 25  Ht 5\' 3"  (1.6 m)  Wt 79.379 kg  BMI 31.01 kg/m2  SpO2 95% Physical Exam  Constitutional: She is oriented to person, place, and time. She appears well-developed and well-nourished.  HENT:  Head: Normocephalic and atraumatic.  Right Ear: External ear normal.  Left Ear: External ear normal.  Nose: Nose normal.  Mouth/Throat: Oropharynx is clear and moist.  Eyes: Conjunctivae are normal. Pupils are equal, round, and reactive to light.  Neck: Normal range of motion.  Cardiovascular:  An irregularly irregular rhythm present. Tachycardia present.   Pulmonary/Chest: Effort normal and breath sounds normal.  Abdominal: Soft. Bowel sounds are normal.  Musculoskeletal: Normal range of motion. She exhibits no edema.  Neurological: She is alert and oriented to person, place, and time.  Skin: Skin is warm and dry.  Psychiatric: She has a normal mood and affect. Her behavior is normal. Judgment and thought content normal.  Nursing note and vitals reviewed.   ED Course  Procedures (including critical care time) Labs Review Labs Reviewed  BASIC METABOLIC PANEL - Abnormal; Notable for  the following:    CO2 21 (*)    Glucose, Bld 163 (*)    BUN 37 (*)    Creatinine, Ser 1.14 (*)    GFR calc non Af Amer 48 (*)    GFR calc Af Amer 56 (*)    All other components within normal limits  CBC - Abnormal; Notable for the following:    Hemoglobin 11.4 (*)    All other components within normal limits  I-STAT TROPOININ, ED    Imaging Review Dg Chest 2 View  03/27/2016  CLINICAL DATA:  Atrial fibrillation and hypertension. EXAM: CHEST  2 VIEW COMPARISON:  None. FINDINGS: The heart size and mediastinal contours are within normal limits. Mild scarring/ atelectasis present at both lung bases. There is no evidence of pulmonary edema, consolidation, pneumothorax, nodule or pleural fluid. The thoracic spine demonstrates diffuse spondylosis. IMPRESSION: Mild bibasilar scarring/ atelectasis. No active cardiopulmonary disease. Electronically Signed   By: Aletta Edouard M.D.   On: 03/27/2016 14:43   I have personally reviewed and evaluated these images and lab results as part of my medical decision-making.   EKG Interpretation   Date/Time:  Monday Mar 27 2016 13:23:14 EDT Ventricular Rate:  129 PR Interval:    QRS Duration: 78 QT Interval:  294 QTC Calculation: 430 R Axis:     Text Interpretation:  Atrial fibrillation with rapid ventricular response  Low voltage QRS Inferior infarct , age undetermined Cannot rule out  Anterior infarct , age undetermined Abnormal ECG Confirmed by Sonji Starkes MD,  Andee Poles [phone]) on 03/27/2016 3:11:44 PM      MDM   Final diagnoses:  New onset atrial fibrillation ([place])  This patients CHA2DS2-VASc Score and unadjusted Ischemic Stroke Rate (% per year) is equal to 3.2 % stroke rate/year from a score of 3  Above score calculated as 1 point each if present [CHF, HTN, DM, Vascular=MI/PAD/Aortic Plaque, Age if 65-74, or Female] Above score calculated as 2 points each if present [Age > 75, or Stroke/TIA/TE]   Patient with Chadvasc score of 3. Pharmacy  consult is regarding anticoagulation. She is being dosed with oral diltiazem for rate control. Given that she does not know when onset was, she is not a candidate for cardioversion at this time. She is to be referred to A. fib clinic.  Patient treated here with eliquis and rx for same. Patient with heart rate controlled at 100 here after by mouth Cardizem. Care discussed with Dr. Ellyn Hack. She is given prescriptions as noted above. We have discussed return precautions and need for follow-up including possible interactions of Cardizem and statins. She and family voice understanding of plan. Questions asked and answered.  [name], MD 03/27/16 [phone]

## 2016-03-27 NOTE — ED Notes (Signed)
Pt sent from PCP after having a physical to be cleared for a dental procedure this week . Pt was noted to be in atrial fibrillation at PCP. Pt has no known history of this. Pt denies any chest pain or sob. HR-125-130's a-fib. Pt is warm and dry.

## 2016-03-30 ENCOUNTER — Encounter (HOSPITAL_COMMUNITY): Payer: Self-pay | Admitting: Nurse Practitioner

## 2016-03-30 ENCOUNTER — Ambulatory Visit (HOSPITAL_COMMUNITY)
Admission: RE | Admit: 2016-03-30 | Discharge: 2016-03-30 | Disposition: A | Discharge status: Home / Self Care | Payer: Commercial Managed Care - HMO | Source: Ambulatory Visit | Attending: Nurse Practitioner | Admitting: Nurse Practitioner

## 2016-03-30 VITALS — BP 160/92 | HR 89 | Ht 63.0 in | Wt 194.0 lb

## 2016-03-30 DIAGNOSIS — M1990 Unspecified osteoarthritis, unspecified site: Secondary | ICD-10-CM | POA: Insufficient documentation

## 2016-03-30 DIAGNOSIS — I480 Paroxysmal atrial fibrillation: Secondary | ICD-10-CM | POA: Diagnosis not present

## 2016-03-30 DIAGNOSIS — Z0189 Encounter for other specified special examinations: Secondary | ICD-10-CM | POA: Diagnosis not present

## 2016-03-30 DIAGNOSIS — E669 Obesity, unspecified: Secondary | ICD-10-CM | POA: Insufficient documentation

## 2016-03-30 DIAGNOSIS — E1122 Type 2 diabetes mellitus with diabetic chronic kidney disease: Secondary | ICD-10-CM | POA: Insufficient documentation

## 2016-03-30 DIAGNOSIS — I129 Hypertensive chronic kidney disease with stage 1 through stage 4 chronic kidney disease, or unspecified chronic kidney disease: Secondary | ICD-10-CM | POA: Diagnosis not present

## 2016-03-30 DIAGNOSIS — Z7901 Long term (current) use of anticoagulants: Secondary | ICD-10-CM | POA: Diagnosis not present

## 2016-03-30 DIAGNOSIS — N189 Chronic kidney disease, unspecified: Secondary | ICD-10-CM | POA: Diagnosis not present

## 2016-03-30 NOTE — Progress Notes (Signed)
Patient ID: Cassandra Allen, female   DOB: 1946/04/26, 70 y.o.   MRN: [id number]     Primary Care Physician: Lilian Coma, MD Referring Physician: [facility] ER   Cassandra Allen is a 70 y.o. female with a h/o DM,HTN, CKD, obesity, that presented to her PCP for routine visit and was found to be in afib with rvr and was asymptomatic. She was sent to the ER and was given a po dose of cardizem, started on eliquis po and f/u visit arranged for pt in the afib clinic.   In the clinic today she is in [place] with pac's. States that she feels fine, she is not aware of any irregular heart beat. She denies feeling short of breath or fatigued with usual activities. Lifestyle issues reviewed. She does not drink alcohol or smoke. Minimum caffeine. No snoring history. 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 So far, is tolerating apixaban and diltiazem.   Today, she denies symptoms of palpitations, chest pain, shortness of breath, orthopnea, PND, lower extremity edema, dizziness, presyncope, syncope, or neurologic sequela. The patient is tolerating medications without difficulties and is otherwise without complaint today.   Past Medical History  Diagnosis Date  . Diabetes mellitus without complication ([place])   . Hypertension   . Arthritis    No past surgical history on file.  Current Outpatient Prescriptions  Medication Sig Dispense Refill  . apixaban (ELIQUIS) 5 MG TABS tablet Take 1 tablet (5 mg total) by mouth 2 (two) times daily. 60 tablet 0  . Cholecalciferol (VITAMIN D) 2000 units tablet Take 4,000 Units by mouth daily.    Marland Kitchen diltiazem (CARDIZEM CD) 180 MG 24 hr capsule Take 1 capsule (180 mg total) by mouth daily. 15 capsule 0  . HYDROcodone-acetaminophen (NORCO) 7.5-325 MG tablet Take 1 tablet by mouth every 6 (six) hours as needed for moderate pain.    . Liraglutide (VICTOZA) 18 MG/3ML SOPN Inject 1.8 mg into the skin daily.    Marland Kitchen loperamide (IMODIUM) 2  MG capsule Take 2-4 mg by mouth 4 (four) times daily as needed for diarrhea or loose stools.    Marland Kitchen losartan-hydrochlorothiazide (HYZAAR) 100-12.5 MG tablet Take 1 tablet by mouth daily.    . Multiple Vitamin (MULTIVITAMIN WITH MINERALS) TABS tablet Take 1 tablet by mouth daily.    . Multiple Vitamins-Minerals (HEALTHY EYES) TABS Take 1 tablet by mouth daily.    Marland Kitchen oxybutynin (DITROPAN) 5 MG tablet Take 10 mg by mouth 3 (three) times daily.     . sertraline (ZOLOFT) 50 MG tablet Take 50 mg by mouth daily at 12 noon.   2  . simvastatin (ZOCOR) 20 MG tablet Take 20 mg by mouth every evening.     . thiamine 100 MG tablet Take 1 tablet (100 mg total) by mouth daily.    . vitamin C (ASCORBIC ACID) 500 MG tablet Take 1,000 mg by mouth daily.    . simethicone (MYLICON) 80 MG chewable tablet Chew 80-160 mg by mouth 2 (two) times daily as needed for flatulence.      No current facility-administered medications for this encounter.    Allergies  Allergen Reactions  . Metformin And Related Nausea And Vomiting  . Prednisone Nausea And Vomiting    Social History   Social History  . Marital Status: Divorced    Spouse Name: N/A  . Number of Children: N/A  . Years of Education: N/A   Occupational History  .  Not on file.   Social History Main Topics  . Smoking status: Never Smoker   . Smokeless tobacco: Not on file  . Alcohol Use: No  . Drug Use: Not on file  . Sexual Activity: Not on file   Other Topics Concern  . Not on file   Social History Narrative    No family history on file.  ROS- All systems are reviewed and negative except as per the HPI above  Physical Exam: Filed Vitals:   03/30/16 1134  BP: 160/92  Pulse: 89  Height: 5\' 3"  (1.6 m)  Weight: 194 lb (87.998 kg)    GEN- The patient is well appearing, alert and oriented x 3 today.   Head- normocephalic, atraumatic Eyes-  Sclera clear, conjunctiva pink Ears- hearing intact Oropharynx- clear Neck- supple, no JVP Lymph-  no cervical lymphadenopathy Lungs- Clear to ausculation bilaterally, normal work of breathing Heart- Irregular rate and rhythm, no murmurs, rubs or gallops, PMI not laterally displaced GI- soft, NT, ND, + BS Extremities- no clubbing, cyanosis, or edema MS- no significant deformity or atrophy Skin- no rash or lesion Psych- euthymic mood, full affect Neuro- strength and sensation are intact  EKG-SR with PAC's, at 89 bpm, pr int 136 ms, qrs int 80 ms, qtc 452 ms Epic records reviewed  Assessment and Plan: 1. New onset asymptomatic afib Continue Cardizem and Eliquis appropriately dosed at 5 mg bid. Creat clearance calculated at 64. Can stop ASA. Bleeding precautions reviewed Echo  Weight loss advised and increase in physical activity  Butch Penny C. Ryah Cribb, [facility] [address] [place], [place] [address] [phone]

## 2016-03-30 NOTE — Patient Instructions (Signed)
Your physician has recommended you make the following change in your medication:  1) Stop Aspirin  

## 2016-03-31 ENCOUNTER — Ambulatory Visit (HOSPITAL_COMMUNITY): Payer: Self-pay | Admitting: Nurse Practitioner

## 2016-04-07 ENCOUNTER — Ambulatory Visit (HOSPITAL_COMMUNITY)
Admission: RE | Admit: 2016-04-07 | Discharge: 2016-04-07 | Disposition: A | Discharge status: Home / Self Care | Payer: Commercial Managed Care - HMO | Source: Ambulatory Visit | Attending: Nurse Practitioner | Admitting: Nurse Practitioner

## 2016-04-07 ENCOUNTER — Other Ambulatory Visit (HOSPITAL_COMMUNITY): Payer: Self-pay | Admitting: *Deleted

## 2016-04-07 DIAGNOSIS — I119 Hypertensive heart disease without heart failure: Secondary | ICD-10-CM | POA: Diagnosis not present

## 2016-04-07 DIAGNOSIS — I071 Rheumatic tricuspid insufficiency: Secondary | ICD-10-CM | POA: Diagnosis not present

## 2016-04-07 DIAGNOSIS — I480 Paroxysmal atrial fibrillation: Secondary | ICD-10-CM | POA: Diagnosis not present

## 2016-04-07 DIAGNOSIS — E119 Type 2 diabetes mellitus without complications: Secondary | ICD-10-CM | POA: Diagnosis not present

## 2016-04-07 DIAGNOSIS — I4891 Unspecified atrial fibrillation: Secondary | ICD-10-CM | POA: Diagnosis present

## 2016-04-07 DIAGNOSIS — I340 Nonrheumatic mitral (valve) insufficiency: Secondary | ICD-10-CM | POA: Diagnosis not present

## 2016-04-07 MED ORDER — APIXABAN 5 MG PO TABS: 5.0000 mg | ORAL_TABLET | Freq: Two times a day (BID) | ORAL | Status: DC

## 2016-04-07 MED ORDER — DILTIAZEM HCL ER COATED BEADS 180 MG PO CP24
180.0000 mg | ORAL_CAPSULE | Freq: Every day | ORAL | Status: DC
Start: 2016-04-07 — End: 2016-04-21

## 2016-04-07 NOTE — Progress Notes (Signed)
  Echocardiogram 2D Echocardiogram has been performed.  Cassandra Allen 04/07/2016, 11:44 AM

## 2016-04-21 ENCOUNTER — Encounter (HOSPITAL_COMMUNITY): Payer: Self-pay | Admitting: Nurse Practitioner

## 2016-04-21 ENCOUNTER — Ambulatory Visit (HOSPITAL_COMMUNITY)
Admission: RE | Admit: 2016-04-21 | Discharge: 2016-04-21 | Disposition: A | Discharge status: Home / Self Care | Payer: Commercial Managed Care - HMO | Source: Ambulatory Visit | Attending: Nurse Practitioner | Admitting: Nurse Practitioner

## 2016-04-21 VITALS — BP 142/60 | HR 121 | Ht 63.0 in | Wt 192.4 lb

## 2016-04-21 DIAGNOSIS — I10 Essential (primary) hypertension: Secondary | ICD-10-CM | POA: Insufficient documentation

## 2016-04-21 DIAGNOSIS — I480 Paroxysmal atrial fibrillation: Secondary | ICD-10-CM

## 2016-04-21 DIAGNOSIS — E119 Type 2 diabetes mellitus without complications: Secondary | ICD-10-CM | POA: Diagnosis not present

## 2016-04-21 DIAGNOSIS — Z7901 Long term (current) use of anticoagulants: Secondary | ICD-10-CM | POA: Diagnosis not present

## 2016-04-21 DIAGNOSIS — M1990 Unspecified osteoarthritis, unspecified site: Secondary | ICD-10-CM | POA: Insufficient documentation

## 2016-04-21 LAB — CBC
HEMATOCRIT: 27.4 % — AB (ref 36.0–46.0)
Hemoglobin: 8.3 g/dL — ABNORMAL LOW (ref 12.0–15.0)
MCH: 27.2 pg (ref 26.0–34.0)
MCHC: 30.3 g/dL (ref 30.0–36.0)
MCV: 89.8 fL (ref 78.0–100.0)
PLATELETS: 184 10*3/uL (ref 150–400)
RBC: 3.05 MIL/uL — ABNORMAL LOW (ref 3.87–5.11)
RDW: 15.7 % — AB (ref 11.5–15.5)
WBC: 10.2 10*3/uL (ref 4.0–10.5)

## 2016-04-21 MED ORDER — DILTIAZEM HCL ER COATED BEADS 120 MG PO CP24: 120.0000 mg | ORAL_CAPSULE | Freq: Two times a day (BID) | ORAL | Status: DC

## 2016-04-21 MED ORDER — APIXABAN 5 MG PO TABS: 5.0000 mg | ORAL_TABLET | Freq: Two times a day (BID) | ORAL | Status: DC

## 2016-04-21 NOTE — Patient Instructions (Signed)
Your physician has recommended you make the following change in your medication:  1)Change cardizem to 120mg  twice day

## 2016-04-21 NOTE — Progress Notes (Addendum)
Patient ID: Cassandra Allen, female   DOB: 08-18-1946, 70 y.o.   MRN: [id number]     Primary Care Physician: Lilian Coma, MD Referring Physician: ER f/u    Cassandra Allen is a 70 y.o. female with a h/o new onset asymptomatic afib that is here for f/u. On last visit, f/u ER,  she was in [place]. Initially, PCP found pt in afib with RVR. She was sent to the ER and started on eliquis and cardizem. She has a heart rate of 121 today but feels fine, no shortness of breath or fatigue or sensations of palpitations. Echo done and shows some mild diastolic dysfunction. She mentions that she has noticed dark stools over the last 7-10 days. Has never had an colonoscopy, "I don't believe in those type of tests". HBG in ER 11.4,(5/15) one year ago 9.4 Chadsvasc score of at least 3.  Today, she denies symptoms of palpitations, chest pain, shortness of breath, orthopnea, PND, lower extremity edema, dizziness, presyncope, syncope, or neurologic sequela. The patient is tolerating medications without difficulties and is otherwise without complaint today.   Past Medical History  Diagnosis Date  . Diabetes mellitus without complication ([place])   . Hypertension   . Arthritis    No past surgical history on file.  Current Outpatient Prescriptions  Medication Sig Dispense Refill  . apixaban (ELIQUIS) 5 MG TABS tablet Take 1 tablet (5 mg total) by mouth 2 (two) times daily. 60 tablet 0  . Cholecalciferol (VITAMIN D) 2000 units tablet Take 4,000 Units by mouth daily.    Marland Kitchen diltiazem (CARDIZEM CD) 120 MG 24 hr capsule Take 1 capsule (120 mg total) by mouth 2 (two) times daily. 60 capsule 3  . HYDROcodone-acetaminophen (NORCO) 7.5-325 MG tablet Take 1 tablet by mouth every 6 (six) hours as needed for moderate pain.    . Liraglutide (VICTOZA) 18 MG/3ML SOPN Inject 1.8 mg into the skin daily.    Marland Kitchen loperamide (IMODIUM) 2 MG capsule Take 2-4 mg by mouth 4 (four) times daily as needed for diarrhea or loose stools.     Marland Kitchen losartan-hydrochlorothiazide (HYZAAR) 100-12.5 MG tablet Take 1 tablet by mouth daily.    . Multiple Vitamins-Minerals (HEALTHY EYES) TABS Take 1 tablet by mouth daily.    Marland Kitchen oxybutynin (DITROPAN) 5 MG tablet Take 10 mg by mouth 3 (three) times daily.     . simethicone (MYLICON) 80 MG chewable tablet Chew 80-160 mg by mouth 2 (two) times daily as needed for flatulence.     . thiamine 100 MG tablet Take 1 tablet (100 mg total) by mouth daily.    . vitamin C (ASCORBIC ACID) 500 MG tablet Take 1,000 mg by mouth daily.    . Multiple Vitamin (MULTIVITAMIN WITH MINERALS) TABS tablet Take 1 tablet by mouth daily.    . sertraline (ZOLOFT) 50 MG tablet Take 50 mg by mouth daily at 12 noon.   2  . simvastatin (ZOCOR) 20 MG tablet Take 10 mg by mouth every evening.     No current facility-administered medications for this encounter.    Allergies  Allergen Reactions  . Metformin And Related Nausea And Vomiting  . Prednisone Nausea And Vomiting    Social History   Social History  . Marital Status: Divorced    Spouse Name: N/A  . Number of Children: N/A  . Years of Education: N/A   Occupational History  . Not on file.   Social History Main Topics  . Smoking status: Never Smoker   .  Smokeless tobacco: Not on file  . Alcohol Use: No  . Drug Use: Not on file  . Sexual Activity: Not on file   Other Topics Concern  . Not on file   Social History Narrative    No family history on file.  ROS- All systems are reviewed and negative except as per the HPI above  Physical Exam: Filed Vitals:   04/21/16 1048  BP: 142/60  Pulse: 121  Height: 5\' 3"  (1.6 m)  Weight: 192 lb 6.4 oz (87.272 kg)    GEN- The patient is well appearing, alert and oriented x 3 today.   Head- normocephalic, atraumatic Eyes-  Sclera clear, conjunctiva pink Ears- hearing intact Oropharynx- clear Neck- supple, no JVP Lymph- no cervical lymphadenopathy Lungs- Clear to ausculation bilaterally, normal work of  breathing Heart- Irregular rate and rhythm, no murmurs, rubs or gallops, PMI not laterally displaced GI- soft, NT, ND, + BS Extremities- no clubbing, cyanosis, or edema MS- no significant deformity or atrophy Skin- no rash or lesion Psych- euthymic mood, full affect Neuro- strength and sensation are intact  EKG-afib with rvr at 121 bpm, qrs int 86 ms, qtc, 428 ms  Assessment and Plan: 1.Asymptomatic PAF In afib today with RVR Stop diltiazem 180 mg daily Start cardizem  120 mg bid CBC today  2. Decrease simvastatin to 10 mg with addition of CCB  F/u in 2 weeks Sees PCP next Wednesday and will need stools cards.  Cassandra Allen, [facility] [address] [place], [place] [address] [phone]   Addendum 6/9 CBC returned with HGB of 8.3, not symptomatic F/u with PCP for further evaluation Stop apixaban, as discussed with Cassandra Allen, for suspected active GI bleeding If has bright red bleeding over the weekend, go to ER Tried to call and discuss with Cassandra Allen, was not available and spoke to triage nurse to give her the message on pt.

## 2016-04-21 NOTE — Addendum Note (Signed)
Encounter addended by: [name], NP on: 04/21/2016  3:16 PM<BR>     Documentation filed: Notes Section

## 2016-04-26 DIAGNOSIS — K922 Gastrointestinal hemorrhage, unspecified: Secondary | ICD-10-CM | POA: Diagnosis not present

## 2016-04-26 DIAGNOSIS — I4891 Unspecified atrial fibrillation: Secondary | ICD-10-CM | POA: Diagnosis not present

## 2016-04-26 DIAGNOSIS — D649 Anemia, unspecified: Secondary | ICD-10-CM | POA: Diagnosis not present

## 2016-04-26 DIAGNOSIS — F322 Major depressive disorder, single episode, severe without psychotic features: Secondary | ICD-10-CM | POA: Diagnosis not present

## 2016-05-03 ENCOUNTER — Ambulatory Visit (HOSPITAL_COMMUNITY)
Admission: RE | Admit: 2016-05-03 | Discharge: 2016-05-03 | Disposition: A | Discharge status: Home / Self Care | Payer: Commercial Managed Care - HMO | Source: Ambulatory Visit | Attending: Nurse Practitioner | Admitting: Nurse Practitioner

## 2016-05-03 ENCOUNTER — Encounter (HOSPITAL_COMMUNITY): Payer: Self-pay | Admitting: Nurse Practitioner

## 2016-05-03 VITALS — BP 142/64 | HR 73 | Ht 63.0 in | Wt 191.8 lb

## 2016-05-03 DIAGNOSIS — N189 Chronic kidney disease, unspecified: Secondary | ICD-10-CM | POA: Diagnosis not present

## 2016-05-03 DIAGNOSIS — I480 Paroxysmal atrial fibrillation: Secondary | ICD-10-CM | POA: Diagnosis not present

## 2016-05-03 DIAGNOSIS — Z7901 Long term (current) use of anticoagulants: Secondary | ICD-10-CM | POA: Insufficient documentation

## 2016-05-03 DIAGNOSIS — E1122 Type 2 diabetes mellitus with diabetic chronic kidney disease: Secondary | ICD-10-CM | POA: Diagnosis not present

## 2016-05-03 DIAGNOSIS — Z79899 Other long term (current) drug therapy: Secondary | ICD-10-CM | POA: Insufficient documentation

## 2016-05-03 DIAGNOSIS — I4891 Unspecified atrial fibrillation: Secondary | ICD-10-CM | POA: Diagnosis present

## 2016-05-03 DIAGNOSIS — I129 Hypertensive chronic kidney disease with stage 1 through stage 4 chronic kidney disease, or unspecified chronic kidney disease: Secondary | ICD-10-CM | POA: Diagnosis not present

## 2016-05-03 NOTE — Progress Notes (Signed)
Patient ID: Cassandra Allen, female   DOB: 01-Sep-1946, 70 y.o.   MRN: [id number]     Primary Care Physician: Lilian Coma, MD Referring Physician: [facility]   Cassandra Allen is a 70 y.o. female with a h/o DM,HTN, CKD, obesity, that presented to her PCP for routine visit and was found to be in afib with rvr and was asymptomatic. She was sent to the ER and was given a po dose of cardizem, started on eliquis po and f/u visit arranged for pt in the afib clinic.Chadsvasc score is at least 4.   In the clinic today she is in [name] with pac's. States that she feels fine, she is not aware of any irregular heart beat. She denies feeling short of breath or fatigued with usual activities. Lifestyle issues reviewed. She does not drink alcohol or smoke. Minimum caffeine. No snoring history. 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 So far, is tolerating apixaban and diltiazem.   CBC done f/u of start of eliquis and showed drop of hgb to 8.3, with previous chronic anemia. DOAC  stopped and pt referred to PCP office. Pt has adamantly refused colonoscopies in the past for unknown reasons although strongly encouraged by PCP to have a work up for the anemia. When she saw her PCP a few days later, HGB was back to 9 and iron started. Pt called office and wanted to restart eliquis. She is in office today and is in [place]. CBC will br repeated next week and if shows drop in H/H then will need to stop DOAC and GI work up again stressed.  Today, she denies symptoms of palpitations, chest pain, shortness of breath, orthopnea, PND, lower extremity edema, dizziness, presyncope, syncope, or neurologic sequela. The patient is tolerating medications without difficulties and is otherwise without complaint today.   Past Medical History  Diagnosis Date  . Diabetes mellitus without complication ([place])   . Hypertension   . Arthritis    No past surgical history on file.  Current  Outpatient Prescriptions  Medication Sig Dispense Refill  . apixaban (ELIQUIS) 5 MG TABS tablet Take 1 tablet (5 mg total) by mouth 2 (two) times daily. 60 tablet 0  . Cholecalciferol (VITAMIN D) 2000 units tablet Take 4,000 Units by mouth daily.    Marland Kitchen diltiazem (CARDIZEM CD) 120 MG 24 hr capsule Take 1 capsule (120 mg total) by mouth 2 (two) times daily. 60 capsule 3  . HYDROcodone-acetaminophen (NORCO) 7.5-325 MG tablet Take 1 tablet by mouth every 6 (six) hours as needed for moderate pain.    . Liraglutide (VICTOZA) 18 MG/3ML SOPN Inject 1.8 mg into the skin daily.    Marland Kitchen loperamide (IMODIUM) 2 MG capsule Take 2-4 mg by mouth 4 (four) times daily as needed for diarrhea or loose stools.    Marland Kitchen losartan-hydrochlorothiazide (HYZAAR) 100-12.5 MG tablet Take 1 tablet by mouth daily.    . Multiple Vitamin (MULTIVITAMIN WITH MINERALS) TABS tablet Take 1 tablet by mouth daily.    . Multiple Vitamins-Minerals (HEALTHY EYES) TABS Take 1 tablet by mouth daily.    Marland Kitchen oxybutynin (DITROPAN) 5 MG tablet Take 10 mg by mouth 3 (three) times daily.     . sertraline (ZOLOFT) 50 MG tablet Take 50 mg by mouth 2 (two) times daily.   2  . simethicone (MYLICON) 80 MG chewable tablet Chew 80-160 mg by mouth 2 (two) times daily as needed for flatulence.     Marland Kitchen  simvastatin (ZOCOR) 20 MG tablet Take 10 mg by mouth every evening.    . thiamine 100 MG tablet Take 1 tablet (100 mg total) by mouth daily.    . vitamin C (ASCORBIC ACID) 500 MG tablet Take 1,000 mg by mouth daily.     No current facility-administered medications for this encounter.    Allergies  Allergen Reactions  . Metformin And Related Nausea And Vomiting  . Prednisone Nausea And Vomiting    Social History   Social History  . Marital Status: Divorced    Spouse Name: N/A  . Number of Children: N/A  . Years of Education: N/A   Occupational History  . Not on file.   Social History Main Topics  . Smoking status: Never Smoker   . Smokeless tobacco:  Not on file  . Alcohol Use: No  . Drug Use: Not on file  . Sexual Activity: Not on file   Other Topics Concern  . Not on file   Social History Narrative    No family history on file.  ROS- All systems are reviewed and negative except as per the HPI above  Physical Exam: Filed Vitals:   05/03/16 1040  BP: 142/64  Pulse: 73  Height: 5\' 3"  (1.6 m)  Weight: 191 lb 12.8 oz (87 kg)    GEN- The patient is well appearing, alert and oriented x 3 today.   Head- normocephalic, atraumatic Eyes-  Sclera clear, conjunctiva pink Ears- hearing intact Oropharynx- clear Neck- supple, no JVP Lymph- no cervical lymphadenopathy Lungs- Clear to ausculation bilaterally, normal work of breathing Heart- Irregular rate and rhythm, no murmurs, rubs or gallops, PMI not laterally displaced GI- soft, NT, ND, + BS Extremities- no clubbing, cyanosis, or edema MS- no significant deformity or atrophy Skin- no rash or lesion Psych- euthymic mood, full affect Neuro- strength and sensation are intact  EKG-SR  Pr int 146 ms, qrs int 86 ms, qtc 420 ms Epic records reviewed Echo Left ventricle: The cavity size was normal. There was mild focal  basal hypertrophy of the septum. Systolic function was vigorous.  The estimated ejection fraction was in the range of 65% to 70%.  Wall motion was normal; there were no regional wall motion  abnormalities. Doppler parameters are consistent with abnormal  left ventricular relaxation (grade 1 diastolic dysfunction).  There was no evidence of elevated ventricular filling pressure by  Doppler parameters. - Aortic valve: There was no regurgitation. - Mitral valve: There was trivial regurgitation. - Left atrium: The atrium was mildly dilated. - Right ventricle: Systolic function was normal. - Right atrium: The atrium was normal in size. - Tricuspid valve: There was mild regurgitation. - Pulmonic valve: Structurally normal valve. - Pulmonary arteries: Systolic  pressure was within the normal  range. - Inferior vena cava: The vessel was normal in size. - Pericardium, extracardiac: There was no pericardial effusion.  Assessment and Plan: 1. New onset asymptomatic afib Now in SR Continue Cardizem and Eliquis appropriately dosed at 5 mg bid. Creat clearance calculated at 64. Off ASA Weight loss advised and increase in physical activity  2. H/O anemia   drop in H/H with addition of DOAC Rechecked at PCP ofice and HGB up to 9 and pt wanted to restart Did so grudgingly but if CBC scheduled for nexr week shows another drop restarting DOAC will have to be stopped and she really should have a GI w/u to see if bleeding source.  F/u in one month Butch Penny C. Kayleen Memos,  [facility] [address] [place], [place] [address] [phone]

## 2016-05-10 ENCOUNTER — Ambulatory Visit (HOSPITAL_COMMUNITY)
Admission: RE | Admit: 2016-05-10 | Discharge: 2016-05-10 | Disposition: A | Discharge status: Home / Self Care | Payer: Commercial Managed Care - HMO | Source: Ambulatory Visit | Attending: Nurse Practitioner | Admitting: Nurse Practitioner

## 2016-05-10 DIAGNOSIS — D649 Anemia, unspecified: Secondary | ICD-10-CM | POA: Diagnosis present

## 2016-05-10 DIAGNOSIS — C649 Malignant neoplasm of unspecified kidney, except renal pelvis: Secondary | ICD-10-CM | POA: Insufficient documentation

## 2016-05-10 DIAGNOSIS — I480 Paroxysmal atrial fibrillation: Secondary | ICD-10-CM | POA: Diagnosis not present

## 2016-05-10 LAB — CBC
HCT: 28.2 % — ABNORMAL LOW (ref 36.0–46.0)
Hemoglobin: 8.9 g/dL — ABNORMAL LOW (ref 12.0–15.0)
MCH: 28.7 pg (ref 26.0–34.0)
MCHC: 31.6 g/dL (ref 30.0–36.0)
MCV: 91 fL (ref 78.0–100.0)
PLATELETS: 206 10*3/uL (ref 150–400)
RBC: 3.1 MIL/uL — ABNORMAL LOW (ref 3.87–5.11)
RDW: 15.8 % — AB (ref 11.5–15.5)
WBC: 6.8 10*3/uL (ref 4.0–10.5)

## 2016-05-22 ENCOUNTER — Telehealth (HOSPITAL_COMMUNITY): Payer: Self-pay | Admitting: *Deleted

## 2016-05-22 NOTE — Telephone Encounter (Signed)
Returned pts call regarding dental work.  Pt was informed per Roderic Palau, NP that she would need to contact the dentist and find out how long she will need to stop blood thinner and if it is for short period of 1-2 days, this should be fine.  Pt will [facility] at [facility] and forwarded to [place].

## 2016-05-24 DIAGNOSIS — D649 Anemia, unspecified: Secondary | ICD-10-CM | POA: Diagnosis not present

## 2016-05-29 ENCOUNTER — Encounter: Payer: Self-pay | Admitting: Internal Medicine

## 2016-06-07 ENCOUNTER — Ambulatory Visit (HOSPITAL_COMMUNITY)
Admission: RE | Admit: 2016-06-07 | Discharge: 2016-06-07 | Disposition: A | Discharge status: Home / Self Care | Payer: Commercial Managed Care - HMO | Source: Ambulatory Visit | Attending: Nurse Practitioner | Admitting: Nurse Practitioner

## 2016-06-07 ENCOUNTER — Encounter (HOSPITAL_COMMUNITY): Payer: Self-pay | Admitting: Nurse Practitioner

## 2016-06-07 VITALS — BP 128/80 | HR 104 | Ht 63.0 in | Wt 197.0 lb

## 2016-06-07 DIAGNOSIS — E669 Obesity, unspecified: Secondary | ICD-10-CM | POA: Insufficient documentation

## 2016-06-07 DIAGNOSIS — I4891 Unspecified atrial fibrillation: Secondary | ICD-10-CM | POA: Insufficient documentation

## 2016-06-07 DIAGNOSIS — Z6834 Body mass index (BMI) 34.0-34.9, adult: Secondary | ICD-10-CM | POA: Insufficient documentation

## 2016-06-07 DIAGNOSIS — Z79899 Other long term (current) drug therapy: Secondary | ICD-10-CM | POA: Insufficient documentation

## 2016-06-07 DIAGNOSIS — E1122 Type 2 diabetes mellitus with diabetic chronic kidney disease: Secondary | ICD-10-CM | POA: Insufficient documentation

## 2016-06-07 DIAGNOSIS — M1990 Unspecified osteoarthritis, unspecified site: Secondary | ICD-10-CM | POA: Diagnosis not present

## 2016-06-07 DIAGNOSIS — I129 Hypertensive chronic kidney disease with stage 1 through stage 4 chronic kidney disease, or unspecified chronic kidney disease: Secondary | ICD-10-CM | POA: Insufficient documentation

## 2016-06-07 DIAGNOSIS — Z7901 Long term (current) use of anticoagulants: Secondary | ICD-10-CM | POA: Diagnosis not present

## 2016-06-07 DIAGNOSIS — Z888 Allergy status to other drugs, medicaments and biological substances status: Secondary | ICD-10-CM | POA: Insufficient documentation

## 2016-06-07 DIAGNOSIS — I480 Paroxysmal atrial fibrillation: Secondary | ICD-10-CM

## 2016-06-07 DIAGNOSIS — N189 Chronic kidney disease, unspecified: Secondary | ICD-10-CM | POA: Insufficient documentation

## 2016-06-07 DIAGNOSIS — D649 Anemia, unspecified: Secondary | ICD-10-CM | POA: Insufficient documentation

## 2016-06-07 LAB — CBC
HCT: 30.3 % — ABNORMAL LOW (ref 36.0–46.0)
Hemoglobin: 9.2 g/dL — ABNORMAL LOW (ref 12.0–15.0)
MCH: 28.1 pg (ref 26.0–34.0)
MCHC: 30.4 g/dL (ref 30.0–36.0)
MCV: 92.7 fL (ref 78.0–100.0)
PLATELETS: 252 10*3/uL (ref 150–400)
RBC: 3.27 MIL/uL — AB (ref 3.87–5.11)
RDW: 15.3 % (ref 11.5–15.5)
WBC: 8.6 10*3/uL (ref 4.0–10.5)

## 2016-06-07 NOTE — Progress Notes (Signed)
Patient ID: Cassandra Allen, female   DOB: 07-30-1946, 70 y.o.   MRN: [id number]     Primary Care Physician: Lilian Coma, MD Referring Physician: [facility]   TACY BREDESEN is a 70 y.o. female with a h/o DM,HTN, CKD, obesity, that presented to her PCP for routine visit and was found to be in afib with rvr and was asymptomatic. She was sent to the ER and was given a po dose of cardizem, started on eliquis po and f/u visit arranged for pt in the afib clinic.Chadsvasc score is at least 4.   In the clinic today she is in [place] with pac's. States that she feels fine, she is not aware of any irregular heart beat. She denies feeling short of breath or fatigued with usual activities. Lifestyle issues reviewed. She does not drink alcohol or smoke. Minimum caffeine. No snoring history. 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 So far, is tolerating apixaban and diltiazem.   CBC done f/u of start of eliquis and showed drop of hgb to 8.3, with previous chronic anemia. DOAC  stopped and pt referred to PCP office. Pt has adamantly refused colonoscopies in the past for unknown reasons although strongly encouraged by PCP to have a work up for the anemia. When she saw her PCP a few days later, HGB was back to 9 and iron started. Pt called office and wanted to restart eliquis. She is in office today and is in [place]. CBC will be repeated next week and if shows drop in H/H then will need to stop DOAC and GI work up again stressed.  Returns to afib clinic 7/26, she is in afib with reasonable rate control. 104 bpm when she walked into clinic, but with pulse ox at rest, her HR is in the 80's. She is unaware when she is in afib vrs SR. Antiarrythmic's discussed and she refuses. CBC drawn today shows H/H low but stable. She refuses GI w/u. She had 5 teeth pulled since last visit and held eliquis x one day.  Today, she denies symptoms of palpitations, chest pain, shortness of  breath, orthopnea, PND, lower extremity edema, dizziness, presyncope, syncope, or neurologic sequela. The patient is tolerating medications without difficulties and is otherwise without complaint today.   Past Medical History:  Diagnosis Date  . Arthritis   . Diabetes mellitus without complication ([place])   . Hypertension    No past surgical history on file.  Current Outpatient Prescriptions  Medication Sig Dispense Refill  . apixaban (ELIQUIS) 5 MG TABS tablet Take 1 tablet (5 mg total) by mouth 2 (two) times daily. 60 tablet 0  . Cholecalciferol (VITAMIN D) 2000 units tablet Take 4,000 Units by mouth daily.    Marland Kitchen diltiazem (CARDIZEM CD) 120 MG 24 hr capsule Take 1 capsule (120 mg total) by mouth 2 (two) times daily. 60 capsule 3  . HYDROcodone-acetaminophen (NORCO) 7.5-325 MG tablet Take 1 tablet by mouth every 6 (six) hours as needed for moderate pain.    . Liraglutide (VICTOZA) 18 MG/3ML SOPN Inject 1.8 mg into the skin daily.    Marland Kitchen loperamide (IMODIUM) 2 MG capsule Take 2-4 mg by mouth 4 (four) times daily as needed for diarrhea or loose stools.    Marland Kitchen losartan-hydrochlorothiazide (HYZAAR) 100-12.5 MG tablet Take 1 tablet by mouth daily.    . Multiple Vitamin (MULTIVITAMIN WITH MINERALS) TABS tablet Take 1 tablet by mouth daily.    . Multiple  Vitamins-Minerals (HEALTHY EYES) TABS Take 1 tablet by mouth daily.    Marland Kitchen oxybutynin (DITROPAN) 5 MG tablet Take 10 mg by mouth 3 (three) times daily.     . sertraline (ZOLOFT) 50 MG tablet Take 50 mg by mouth 2 (two) times daily.   2  . simethicone (MYLICON) 80 MG chewable tablet Chew 80-160 mg by mouth 2 (two) times daily as needed for flatulence.     . simvastatin (ZOCOR) 20 MG tablet Take 10 mg by mouth every evening.    . thiamine 100 MG tablet Take 1 tablet (100 mg total) by mouth daily.    . vitamin C (ASCORBIC ACID) 500 MG tablet Take 1,000 mg by mouth daily.     No current facility-administered medications for this encounter.     Allergies    Allergen Reactions  . Metformin And Related Nausea And Vomiting  . Prednisone Nausea And Vomiting    Social History   Social History  . Marital status: Divorced    Spouse name: N/A  . Number of children: N/A  . Years of education: N/A   Occupational History  . Not on file.   Social History Main Topics  . Smoking status: Never Smoker  . Smokeless tobacco: Not on file  . Alcohol use No  . Drug use: Unknown  . Sexual activity: Not on file   Other Topics Concern  . Not on file   Social History Narrative  . No narrative on file    No family history on file.  ROS- All systems are reviewed and negative except as per the HPI above  Physical Exam: Vitals:   06/07/16 1421 06/07/16 1539 06/07/16 1540  BP: (!) 152/60 (!) 152/60 128/80  Pulse: (!) 104    Weight: 197 lb (89.4 kg)    Height: 5\' 3"  (1.6 m)      GEN- The patient is well appearing, alert and oriented x 3 today.   Head- normocephalic, atraumatic Eyes-  Sclera clear, conjunctiva pink Ears- hearing intact Oropharynx- clear Neck- supple, no JVP Lymph- no cervical lymphadenopathy Lungs- Clear to ausculation bilaterally, normal work of breathing Heart- Irregular rate and rhythm, no murmurs, rubs or gallops, PMI not laterally displaced GI- soft, NT, ND, + BS Extremities- no clubbing, cyanosis, or edema MS- no significant deformity or atrophy Skin- no rash or lesion Psych- euthymic mood, full affect Neuro- strength and sensation are intact  EKG-SR  Pr int 146 ms, qrs int 86 ms, qtc 420 ms Epic records reviewed Echo Left ventricle: The cavity size was normal. There was mild focal  basal hypertrophy of the septum. Systolic function was vigorous.  The estimated ejection fraction was in the range of 65% to 70%.  Wall motion was normal; there were no regional wall motion  abnormalities. Doppler parameters are consistent with abnormal  left ventricular relaxation (grade 1 diastolic dysfunction).  There was  no evidence of elevated ventricular filling pressure by  Doppler parameters. - Aortic valve: There was no regurgitation. - Mitral valve: There was trivial regurgitation. - Left atrium: The atrium was mildly dilated. - Right ventricle: Systolic function was normal. - Right atrium: The atrium was normal in size. - Tricuspid valve: There was mild regurgitation. - Pulmonic valve: Structurally normal valve. - Pulmonary arteries: Systolic pressure was within the normal  range. - Inferior vena cava: The vessel was normal in size. - Pericardium, extracardiac: There was no pericardial effusion.  Assessment and Plan: 1. New onset asymptomatic afib In afib today  but is asymptomatic when in afib  Refuses AAD's Continue Cardizem and Eliquis appropriately dosed at 5 mg bid. Creat clearance calculated at 64. Off ASA Weight loss advised and increase in physical activity  2. H/O anemia  Initial drop in H/H with addition of DOAC,but now is stable Rechecked at PCP ofice and HGB up to 9 and pt wanted to restart Did so grudgingly but if CBC shows another drop DOAC will have to be stopped and she really should have a GI w/u to see if there is bleeding source. CBC today is stable  F/u in 6 weeks  Butch Penny C. Rodneisha Bonnet, [facility] [address] [place], [place] [address] [phone]

## 2016-06-09 ENCOUNTER — Other Ambulatory Visit (HOSPITAL_COMMUNITY): Payer: Self-pay | Admitting: *Deleted

## 2016-06-09 MED ORDER — APIXABAN 5 MG PO TABS: 5.0000 mg | ORAL_TABLET | Freq: Two times a day (BID) | ORAL | 11 refills | Status: DC

## 2016-06-16 DIAGNOSIS — E1121 Type 2 diabetes mellitus with diabetic nephropathy: Secondary | ICD-10-CM | POA: Diagnosis not present

## 2016-06-16 DIAGNOSIS — M25562 Pain in left knee: Secondary | ICD-10-CM | POA: Diagnosis not present

## 2016-06-16 DIAGNOSIS — N183 Chronic kidney disease, stage 3 (moderate): Secondary | ICD-10-CM | POA: Diagnosis not present

## 2016-06-16 DIAGNOSIS — D649 Anemia, unspecified: Secondary | ICD-10-CM | POA: Diagnosis not present

## 2016-06-16 DIAGNOSIS — I4891 Unspecified atrial fibrillation: Secondary | ICD-10-CM | POA: Diagnosis not present

## 2016-07-05 ENCOUNTER — Other Ambulatory Visit (HOSPITAL_COMMUNITY): Payer: Self-pay | Admitting: *Deleted

## 2016-07-05 MED ORDER — DILTIAZEM HCL ER COATED BEADS 120 MG PO CP24: 120.0000 mg | ORAL_CAPSULE | Freq: Two times a day (BID) | ORAL | 0 refills | Status: DC

## 2016-07-19 ENCOUNTER — Inpatient Hospital Stay (HOSPITAL_COMMUNITY): Admission: RE | Admit: 2016-07-19 | Payer: Self-pay | Source: Ambulatory Visit | Admitting: Nurse Practitioner

## 2016-07-21 ENCOUNTER — Inpatient Hospital Stay (HOSPITAL_COMMUNITY): Admission: RE | Admit: 2016-07-21 | Payer: Self-pay | Source: Ambulatory Visit | Admitting: Nurse Practitioner

## 2016-08-02 ENCOUNTER — Ambulatory Visit (HOSPITAL_COMMUNITY)
Admission: RE | Admit: 2016-08-02 | Discharge: 2016-08-02 | Disposition: A | Discharge status: Home / Self Care | Payer: Commercial Managed Care - HMO | Source: Ambulatory Visit | Attending: Nurse Practitioner | Admitting: Nurse Practitioner

## 2016-08-02 VITALS — BP 130/72 | HR 76 | Ht 63.0 in | Wt 190.4 lb

## 2016-08-02 DIAGNOSIS — Z794 Long term (current) use of insulin: Secondary | ICD-10-CM | POA: Diagnosis not present

## 2016-08-02 DIAGNOSIS — E669 Obesity, unspecified: Secondary | ICD-10-CM | POA: Insufficient documentation

## 2016-08-02 DIAGNOSIS — Z6833 Body mass index (BMI) 33.0-33.9, adult: Secondary | ICD-10-CM | POA: Insufficient documentation

## 2016-08-02 DIAGNOSIS — Z7901 Long term (current) use of anticoagulants: Secondary | ICD-10-CM | POA: Insufficient documentation

## 2016-08-02 DIAGNOSIS — I480 Paroxysmal atrial fibrillation: Secondary | ICD-10-CM

## 2016-08-02 DIAGNOSIS — N189 Chronic kidney disease, unspecified: Secondary | ICD-10-CM | POA: Diagnosis not present

## 2016-08-02 DIAGNOSIS — I129 Hypertensive chronic kidney disease with stage 1 through stage 4 chronic kidney disease, or unspecified chronic kidney disease: Secondary | ICD-10-CM | POA: Diagnosis not present

## 2016-08-02 DIAGNOSIS — E1122 Type 2 diabetes mellitus with diabetic chronic kidney disease: Secondary | ICD-10-CM | POA: Insufficient documentation

## 2016-08-02 DIAGNOSIS — I4891 Unspecified atrial fibrillation: Secondary | ICD-10-CM | POA: Diagnosis not present

## 2016-08-02 LAB — CBC
HCT: 31.3 % — ABNORMAL LOW (ref 36.0–46.0)
Hemoglobin: 9.8 g/dL — ABNORMAL LOW (ref 12.0–15.0)
MCH: 28.8 pg (ref 26.0–34.0)
MCHC: 31.3 g/dL (ref 30.0–36.0)
MCV: 92.1 fL (ref 78.0–100.0)
PLATELETS: 251 10*3/uL (ref 150–400)
RBC: 3.4 MIL/uL — ABNORMAL LOW (ref 3.87–5.11)
RDW: 15.5 % (ref 11.5–15.5)
WBC: 7.5 10*3/uL (ref 4.0–10.5)

## 2016-08-02 NOTE — Progress Notes (Signed)
Patient ID: Cassandra Allen, female   DOB: August 28, 1946, 70 y.o.   MRN: [id number]     Primary Care Physician: Lilian Coma, MD Referring Physician: [facility]   Cassandra Allen is a 71 y.o. female with a h/o DM,HTN, CKD, obesity, that presented to her PCP for routine visit and was found to be in afib with rvr and was asymptomatic. She was sent to the ER and was given a po dose of cardizem, started on eliquis po and f/u visit arranged for pt in the afib clinic.Chadsvasc score is at least 4.   In the clinic today she is in [place] with pac's. States that she feels fine, she is not aware of any irregular heart beat. She denies feeling short of breath or fatigued with usual activities. Lifestyle issues reviewed. She does not drink alcohol or smoke. Minimum caffeine. No snoring history. 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 So far, is tolerating apixaban and diltiazem.   CBC done f/u of start of eliquis and showed drop of hgb to 8.3, with previous chronic anemia. DOAC  stopped and pt referred to PCP office. Pt has adamantly refused colonoscopies in the past for unknown reasons although strongly encouraged by PCP to have a work up for the anemia. When she saw her PCP a few days later, HGB was back to 9 and iron started. Pt called office and wanted to restart eliquis. She is in office today and is in [place]. CBC will be repeated next week and if shows drop in H/H then will need to stop DOAC and GI work up again stressed.  Returns to afib clinic 7/26, she is in afib with reasonable rate control. 104 bpm when she walked into clinic, but with pulse ox at rest, her HR is in the 80's. She is unaware when she is in afib vrs SR. Antiarrythmic's discussed and she refuses. CBC drawn today shows H/H low but stable. She refuses GI w/u. She had 5 teeth pulled since last visit and held eliquis x one day.  F/u 9/20, she is in [place]. On DAOC, with some visible blood in stool in the  past but on previous occasion. Has been anemic in the past. Will get cbc today. Weight is down several pounds but pt not trying. She says it may be the result of her new DM drug that is suppose to promote weight loss.  Today, she denies symptoms of palpitations, chest pain, shortness of breath, orthopnea, PND, lower extremity edema, dizziness, presyncope, syncope, or neurologic sequela. The patient is tolerating medications without difficulties and is otherwise without complaint today.   Past Medical History:  Diagnosis Date  . Arthritis   . Diabetes mellitus without complication ([place])   . Hypertension    No past surgical history on file.  Current Outpatient Prescriptions  Medication Sig Dispense Refill  . apixaban (ELIQUIS) 5 MG TABS tablet Take 1 tablet (5 mg total) by mouth 2 (two) times daily. 60 tablet 11  . Cholecalciferol (VITAMIN D) 2000 units tablet Take 4,000 Units by mouth daily.    Marland Kitchen diltiazem (CARDIZEM CD) 120 MG 24 hr capsule Take 1 capsule (120 mg total) by mouth 2 (two) times daily. 180 capsule 0  . ferrous sulfate 325 (65 FE) MG EC tablet Take 325 mg by mouth 2 (two) times daily.    Marland Kitchen HYDROcodone-acetaminophen (NORCO) 7.5-325 MG tablet Take 1 tablet by mouth every 6 (six) hours as needed  for moderate pain.    . Liraglutide (VICTOZA) 18 MG/3ML SOPN Inject 1.8 mg into the skin daily.    Marland Kitchen loperamide (IMODIUM) 2 MG capsule Take 2-4 mg by mouth 4 (four) times daily as needed for diarrhea or loose stools.    Marland Kitchen losartan-hydrochlorothiazide (HYZAAR) 100-12.5 MG tablet Take 1 tablet by mouth daily.    . Multiple Vitamin (MULTIVITAMIN WITH MINERALS) TABS tablet Take 1 tablet by mouth daily.    . Multiple Vitamins-Minerals (HEALTHY EYES) TABS Take 1 tablet by mouth daily.    Marland Kitchen oxybutynin (DITROPAN) 5 MG tablet Take 10 mg by mouth 3 (three) times daily.     . sertraline (ZOLOFT) 50 MG tablet Take 50 mg by mouth 2 (two) times daily.   2  . simethicone (MYLICON) 80 MG chewable tablet  Chew 80-160 mg by mouth 2 (two) times daily as needed for flatulence.     . simvastatin (ZOCOR) 20 MG tablet Take 10 mg by mouth every evening.    . thiamine 100 MG tablet Take 1 tablet (100 mg total) by mouth daily.    . vitamin C (ASCORBIC ACID) 500 MG tablet Take 1,000 mg by mouth daily.     No current facility-administered medications for this encounter.     Allergies  Allergen Reactions  . Metformin And Related Nausea And Vomiting  . Prednisone Nausea And Vomiting    Social History   Social History  . Marital status: Divorced    Spouse name: N/A  . Number of children: N/A  . Years of education: N/A   Occupational History  . Not on file.   Social History Main Topics  . Smoking status: Never Smoker  . Smokeless tobacco: Not on file  . Alcohol use No  . Drug use: Unknown  . Sexual activity: Not on file   Other Topics Concern  . Not on file   Social History Narrative  . No narrative on file    No family history on file.  ROS- All systems are reviewed and negative except as per the HPI above  Physical Exam: Vitals:   08/02/16 1349  BP: 130/72  Pulse: 76  Weight: 190 lb 6.4 oz (86.4 kg)  Height: 5\' 3"  (1.6 m)    GEN- The patient is well appearing, alert and oriented x 3 today.   Head- normocephalic, atraumatic Eyes-  Sclera clear, conjunctiva pink Ears- hearing intact Oropharynx- clear Neck- supple, no JVP Lymph- no cervical lymphadenopathy Lungs- Clear to ausculation bilaterally, normal work of breathing Heart- Irregular rate and rhythm, no murmurs, rubs or gallops, PMI not laterally displaced GI- soft, NT, ND, + BS Extremities- no clubbing, cyanosis, or edema MS- no significant deformity or atrophy Skin- no rash or lesion Psych- euthymic mood, full affect Neuro- strength and sensation are intact  EKG-SR  Pr int 146 ms, qrs int 86 ms, qtc 420 ms Epic records reviewed Echo Left ventricle: The cavity size was normal. There was mild focal  basal  hypertrophy of the septum. Systolic function was vigorous.  The estimated ejection fraction was in the range of 65% to 70%.  Wall motion was normal; there were no regional wall motion  abnormalities. Doppler parameters are consistent with abnormal  left ventricular relaxation (grade 1 diastolic dysfunction).  There was no evidence of elevated ventricular filling pressure by  Doppler parameters. - Aortic valve: There was no regurgitation. - Mitral valve: There was trivial regurgitation. - Left atrium: The atrium was mildly dilated. - Right ventricle:  Systolic function was normal. - Right atrium: The atrium was normal in size. - Tricuspid valve: There was mild regurgitation. - Pulmonic valve: Structurally normal valve. - Pulmonary arteries: Systolic pressure was within the normal  range. - Inferior vena cava: The vessel was normal in size. - Pericardium, extracardiac: There was no pericardial effusion.  Assessment and Plan: 1. New onset asymptomatic afib In SR today, but is asymptomatic when in afib Refuses AAD's Continue Cardizem and Eliquis appropriately dosed at 5 mg bid. Creat clearance calculated at 64. Off ASA Weight loss advised and increase in physical activity  2. H/O anemia  Initial drop in H/H with addition of DOAC,but now is stable Rechecked at PCP ofice and HGB up to 9 and pt wanted to restart I did so grudgingly but if CBC shows another drop DOAC, consider  d/c'ing and  have a GI w/u to see if there is a bleeding source. CBC today   F/u in 3 months  Butch Penny C. [place], [facility] [address] [place], [place] [address] [phone]

## 2016-08-30 ENCOUNTER — Ambulatory Visit (INDEPENDENT_AMBULATORY_CARE_PROVIDER_SITE_OTHER): Payer: Commercial Managed Care - HMO | Admitting: Orthopedic Surgery

## 2016-08-30 DIAGNOSIS — M76821 Posterior tibial tendinitis, right leg: Secondary | ICD-10-CM | POA: Diagnosis not present

## 2016-09-28 ENCOUNTER — Other Ambulatory Visit (HOSPITAL_COMMUNITY): Payer: Self-pay | Admitting: *Deleted

## 2016-09-28 MED ORDER — DILTIAZEM HCL ER COATED BEADS 120 MG PO CP24: 120.0000 mg | ORAL_CAPSULE | Freq: Two times a day (BID) | ORAL | 3 refills | Status: DC

## 2016-10-27 DIAGNOSIS — D509 Iron deficiency anemia, unspecified: Secondary | ICD-10-CM | POA: Diagnosis not present

## 2016-10-27 DIAGNOSIS — K219 Gastro-esophageal reflux disease without esophagitis: Secondary | ICD-10-CM | POA: Diagnosis not present

## 2016-10-27 DIAGNOSIS — E1165 Type 2 diabetes mellitus with hyperglycemia: Secondary | ICD-10-CM | POA: Diagnosis not present

## 2016-10-27 DIAGNOSIS — N3281 Overactive bladder: Secondary | ICD-10-CM | POA: Diagnosis not present

## 2016-11-01 ENCOUNTER — Encounter (HOSPITAL_COMMUNITY): Payer: Self-pay | Admitting: Nurse Practitioner

## 2016-11-01 ENCOUNTER — Ambulatory Visit (HOSPITAL_COMMUNITY)
Admission: RE | Admit: 2016-11-01 | Discharge: 2016-11-01 | Disposition: A | Discharge status: Home / Self Care | Payer: Commercial Managed Care - HMO | Source: Ambulatory Visit | Attending: Nurse Practitioner | Admitting: Nurse Practitioner

## 2016-11-01 VITALS — BP 162/94 | HR 96 | Ht 63.0 in | Wt 194.6 lb

## 2016-11-01 DIAGNOSIS — E1122 Type 2 diabetes mellitus with diabetic chronic kidney disease: Secondary | ICD-10-CM | POA: Insufficient documentation

## 2016-11-01 DIAGNOSIS — N189 Chronic kidney disease, unspecified: Secondary | ICD-10-CM | POA: Diagnosis not present

## 2016-11-01 DIAGNOSIS — E669 Obesity, unspecified: Secondary | ICD-10-CM | POA: Diagnosis not present

## 2016-11-01 DIAGNOSIS — Z79899 Other long term (current) drug therapy: Secondary | ICD-10-CM | POA: Insufficient documentation

## 2016-11-01 DIAGNOSIS — Z6834 Body mass index (BMI) 34.0-34.9, adult: Secondary | ICD-10-CM | POA: Diagnosis not present

## 2016-11-01 DIAGNOSIS — I481 Persistent atrial fibrillation: Secondary | ICD-10-CM | POA: Diagnosis not present

## 2016-11-01 DIAGNOSIS — Z7901 Long term (current) use of anticoagulants: Secondary | ICD-10-CM | POA: Diagnosis not present

## 2016-11-01 DIAGNOSIS — Z794 Long term (current) use of insulin: Secondary | ICD-10-CM | POA: Diagnosis not present

## 2016-11-01 DIAGNOSIS — I129 Hypertensive chronic kidney disease with stage 1 through stage 4 chronic kidney disease, or unspecified chronic kidney disease: Secondary | ICD-10-CM | POA: Insufficient documentation

## 2016-11-01 DIAGNOSIS — I480 Paroxysmal atrial fibrillation: Secondary | ICD-10-CM

## 2016-11-01 MED ORDER — DILTIAZEM HCL ER COATED BEADS 120 MG PO CP24: 120.0000 mg | ORAL_CAPSULE | Freq: Two times a day (BID) | ORAL | 3 refills | Status: DC

## 2016-11-01 NOTE — Progress Notes (Addendum)
Patient ID: Cassandra Allen, female   DOB: 1946-08-05, 70 y.o.   MRN: [id number]     Primary Care Physician: Lilian Coma, MD Referring Physician: [facility]   Cassandra Allen is a 70 y.o. female with a h/o DM,HTN, CKD, obesity, that presented to her PCP for routine visit and was found to be in afib with rvr and was asymptomatic. She was sent to the ER and was given a po dose of cardizem, started on eliquis po and f/u visit arranged for pt in the afib clinic.Chadsvasc score is at least 4.   In the clinic today she is in [place] with pac's. States that she feels fine, she is not aware of any irregular heart beat. She denies feeling short of breath or fatigued with usual activities. Lifestyle issues reviewed. She does not drink alcohol or smoke. Minimum caffeine. No snoring history. 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 So far, is tolerating apixaban and diltiazem.   CBC done f/u of start of eliquis and showed drop of hgb to 8.3, with previous chronic anemia. DOAC  stopped and pt referred to PCP office. Pt has adamantly refused colonoscopies in the past for unknown reasons although strongly encouraged by PCP to have a work up for the anemia. When she saw her PCP a few days later, HGB was back to 9 and iron started. Pt called office and wanted to restart eliquis. She is in office today and is in [place]. CBC will be repeated next week and if shows drop in H/H then will need to stop DOAC and GI work up again stressed.  Returns to afib clinic 7/26, she is in afib with reasonable rate control. 104 bpm when she walked into clinic, but with pulse ox at rest, her HR is in the 80's. She is unaware when she is in afib vrs SR. Antiarrythmic's discussed and she refuses. CBC drawn today shows H/H low but stable. She refuses GI w/u. She had 5 teeth pulled since last visit and held eliquis x one day.  F/u 9/20, she is in [place]. On DAOC, with some visible blood in stool in the  past but on previous occasion. Has been anemic in the past. Will get cbc today. Weight is down several pounds but pt not trying. She says it may be the result of her new DM drug that is suppose to promote weight loss.  F/u 12/20. Pt is in rate controlled afib and is asymptomatic. Denies any fatigue or exertional dyspnea. She is taking apixaban without bleeding issues. Recent labs by pcp show normal cbc. She had been anemic at one time and had noted intermittently rectal bleeding which is now resolved.  Today, she denies symptoms of palpitations, chest pain, shortness of breath, orthopnea, PND, lower extremity edema, dizziness, presyncope, syncope, or neurologic sequela. The patient is tolerating medications without difficulties and is otherwise without complaint today.   Past Medical History:  Diagnosis Date  . Arthritis   . Diabetes mellitus without complication ([place])   . Hypertension    No past surgical history on file.  Current Outpatient Prescriptions  Medication Sig Dispense Refill  . apixaban (ELIQUIS) 5 MG TABS tablet Take 1 tablet (5 mg total) by mouth 2 (two) times daily. 60 tablet 11  . Cholecalciferol (VITAMIN D) 2000 units tablet Take 4,000 Units by mouth daily.    Marland Kitchen diltiazem (CARDIZEM CD) 120 MG 24 hr capsule Take 1 capsule (120 mg  total) by mouth 2 (two) times daily. 60 capsule 3  . ferrous sulfate 325 (65 FE) MG EC tablet Take 325 mg by mouth 2 (two) times daily.    Marland Kitchen HYDROcodone-acetaminophen (NORCO) 7.5-325 MG tablet Take 1 tablet by mouth every 6 (six) hours as needed for moderate pain.    Marland Kitchen losartan-hydrochlorothiazide (HYZAAR) 100-12.5 MG tablet Take 1 tablet by mouth daily.    . Multiple Vitamin (MULTIVITAMIN WITH MINERALS) TABS tablet Take 1 tablet by mouth daily.    . Multiple Vitamins-Minerals (HEALTHY EYES) TABS Take 1 tablet by mouth daily.    . sertraline (ZOLOFT) 100 MG tablet Take 100 mg by mouth daily.    . simethicone (MYLICON) 80 MG chewable tablet Chew  80-160 mg by mouth 2 (two) times daily as needed for flatulence.     . simvastatin (ZOCOR) 20 MG tablet Take 20 mg by mouth every evening.     . thiamine 100 MG tablet Take 1 tablet (100 mg total) by mouth daily.    . vitamin C (ASCORBIC ACID) 500 MG tablet Take 1,000 mg by mouth daily.    . Liraglutide (VICTOZA) 18 MG/3ML SOPN Inject 1.8 mg into the skin daily.     No current facility-administered medications for this encounter.     Allergies  Allergen Reactions  . Metformin And Related Nausea And Vomiting  . Prednisone Nausea And Vomiting    Social History   Social History  . Marital status: Divorced    Spouse name: N/A  . Number of children: N/A  . Years of education: N/A   Occupational History  . Not on file.   Social History Main Topics  . Smoking status: Never Smoker  . Smokeless tobacco: Not on file  . Alcohol use No  . Drug use: Unknown  . Sexual activity: Not on file   Other Topics Concern  . Not on file   Social History Narrative  . No narrative on file    No family history on file.  ROS- All systems are reviewed and negative except as per the HPI above  Physical Exam: Vitals:   11/01/16 1330  BP: (!) 162/94  Pulse: 96  Weight: 194 lb 9.6 oz (88.3 kg)  Height: 5\' 3"  (1.6 m)  BP rechecked at 134/88  GEN- The patient is well appearing, alert and oriented x 3 today.   Head- normocephalic, atraumatic Eyes-  Sclera clear, conjunctiva pink Ears- hearing intact Oropharynx- clear Neck- supple, no JVP Lymph- no cervical lymphadenopathy Lungs- Clear to ausculation bilaterally, normal work of breathing Heart- Irregular rate and rhythm, no murmurs, rubs or gallops, PMI not laterally displaced GI- soft, NT, ND, + BS Extremities- no clubbing, cyanosis, or edema MS- no significant deformity or atrophy Skin- no rash or lesion Psych- euthymic mood, full affect Neuro- strength and sensation are intact  EKG-afib at 96 bpm, qrs int 78 ms, qtc 424 ms Epic  records reviewed Echo Left ventricle: The cavity size was normal. There was mild focal  basal hypertrophy of the septum. Systolic function was vigorous.  The estimated ejection fraction was in the range of 65% to 70%.  Wall motion was normal; there were no regional wall motion  abnormalities. Doppler parameters are consistent with abnormal  left ventricular relaxation (grade 1 diastolic dysfunction).  There was no evidence of elevated ventricular filling pressure by  Doppler parameters. - Aortic valve: There was no regurgitation. - Mitral valve: There was trivial regurgitation. - Left atrium: The atrium was mildly  dilated. - Right ventricle: Systolic function was normal. - Right atrium: The atrium was normal in size. - Tricuspid valve: There was mild regurgitation. - Pulmonic valve: Structurally normal valve. - Pulmonary arteries: Systolic pressure was within the normal  range. - Inferior vena cava: The vessel was normal in size. - Pericardium, extracardiac: There was no pericardial effusion.  Assessment and Plan: 1. Persistent asymptomatic afib In afib and is feeling well Refuses AAD's Continue Cardizem and Eliquis appropriately dosed at 5 mg bid. Creat clearance calculated at 64. Off ASA Weight loss advised and asked to increase in physical activity  2. H/O anemia  Initial drop in H/H with addition of DOAC,but now CBC is wnl Continue apixaban 5 mg bid  F/u in 4 months  Butch Penny C. Majesta Leichter, [facility] [address] [place], [place] [address] [phone]

## 2016-11-01 NOTE — Addendum Note (Signed)
Encounter addended by: [name], NP on: 11/01/2016  3:33 PM<BR>    Actions taken: Sign clinical note

## 2016-11-17 DIAGNOSIS — E1165 Type 2 diabetes mellitus with hyperglycemia: Secondary | ICD-10-CM | POA: Diagnosis not present

## 2016-11-17 DIAGNOSIS — N390 Urinary tract infection, site not specified: Secondary | ICD-10-CM | POA: Diagnosis not present

## 2016-11-17 DIAGNOSIS — N3281 Overactive bladder: Secondary | ICD-10-CM | POA: Diagnosis not present

## 2016-11-27 ENCOUNTER — Other Ambulatory Visit (HOSPITAL_COMMUNITY): Payer: Self-pay | Admitting: *Deleted

## 2016-11-27 MED ORDER — DILTIAZEM HCL ER COATED BEADS 120 MG PO CP24: 120.0000 mg | ORAL_CAPSULE | Freq: Two times a day (BID) | ORAL | 3 refills | Status: DC

## 2017-02-02 DIAGNOSIS — G894 Chronic pain syndrome: Secondary | ICD-10-CM | POA: Diagnosis not present

## 2017-02-02 DIAGNOSIS — Z79899 Other long term (current) drug therapy: Secondary | ICD-10-CM | POA: Diagnosis not present

## 2017-02-02 DIAGNOSIS — N3281 Overactive bladder: Secondary | ICD-10-CM | POA: Diagnosis not present

## 2017-02-02 DIAGNOSIS — D509 Iron deficiency anemia, unspecified: Secondary | ICD-10-CM | POA: Diagnosis not present

## 2017-02-02 DIAGNOSIS — Z7984 Long term (current) use of oral hypoglycemic drugs: Secondary | ICD-10-CM | POA: Diagnosis not present

## 2017-02-02 DIAGNOSIS — E1165 Type 2 diabetes mellitus with hyperglycemia: Secondary | ICD-10-CM | POA: Diagnosis not present

## 2017-02-02 DIAGNOSIS — F324 Major depressive disorder, single episode, in partial remission: Secondary | ICD-10-CM | POA: Diagnosis not present

## 2017-02-02 DIAGNOSIS — E785 Hyperlipidemia, unspecified: Secondary | ICD-10-CM | POA: Diagnosis not present

## 2017-02-07 ENCOUNTER — Other Ambulatory Visit (HOSPITAL_COMMUNITY): Payer: Self-pay | Admitting: *Deleted

## 2017-02-07 MED ORDER — APIXABAN 5 MG PO TABS: 5.0000 mg | ORAL_TABLET | Freq: Two times a day (BID) | ORAL | 6 refills | Status: DC

## 2017-02-08 DIAGNOSIS — N3281 Overactive bladder: Secondary | ICD-10-CM | POA: Diagnosis not present

## 2017-02-08 DIAGNOSIS — E785 Hyperlipidemia, unspecified: Secondary | ICD-10-CM | POA: Diagnosis not present

## 2017-02-08 DIAGNOSIS — E1121 Type 2 diabetes mellitus with diabetic nephropathy: Secondary | ICD-10-CM | POA: Diagnosis not present

## 2017-02-08 DIAGNOSIS — N183 Chronic kidney disease, stage 3 (moderate): Secondary | ICD-10-CM | POA: Diagnosis not present

## 2017-02-08 DIAGNOSIS — I10 Essential (primary) hypertension: Secondary | ICD-10-CM | POA: Diagnosis not present

## 2017-02-28 ENCOUNTER — Ambulatory Visit (HOSPITAL_COMMUNITY): Payer: Self-pay | Admitting: Nurse Practitioner

## 2017-03-07 ENCOUNTER — Encounter (HOSPITAL_COMMUNITY): Payer: Self-pay | Admitting: Nurse Practitioner

## 2017-03-07 ENCOUNTER — Ambulatory Visit (HOSPITAL_COMMUNITY)
Admission: RE | Admit: 2017-03-07 | Discharge: 2017-03-07 | Disposition: A | Discharge status: Home / Self Care | Payer: Commercial Managed Care - HMO | Source: Ambulatory Visit | Attending: Nurse Practitioner | Admitting: Nurse Practitioner

## 2017-03-07 VITALS — BP 156/84 | HR 94 | Ht 63.0 in | Wt 195.2 lb

## 2017-03-07 DIAGNOSIS — Z794 Long term (current) use of insulin: Secondary | ICD-10-CM | POA: Diagnosis not present

## 2017-03-07 DIAGNOSIS — I4891 Unspecified atrial fibrillation: Secondary | ICD-10-CM | POA: Insufficient documentation

## 2017-03-07 DIAGNOSIS — N189 Chronic kidney disease, unspecified: Secondary | ICD-10-CM | POA: Insufficient documentation

## 2017-03-07 DIAGNOSIS — Z7901 Long term (current) use of anticoagulants: Secondary | ICD-10-CM | POA: Insufficient documentation

## 2017-03-07 DIAGNOSIS — Z888 Allergy status to other drugs, medicaments and biological substances status: Secondary | ICD-10-CM | POA: Diagnosis not present

## 2017-03-07 DIAGNOSIS — D649 Anemia, unspecified: Secondary | ICD-10-CM | POA: Insufficient documentation

## 2017-03-07 DIAGNOSIS — I481 Persistent atrial fibrillation: Secondary | ICD-10-CM

## 2017-03-07 DIAGNOSIS — E669 Obesity, unspecified: Secondary | ICD-10-CM | POA: Diagnosis not present

## 2017-03-07 DIAGNOSIS — I129 Hypertensive chronic kidney disease with stage 1 through stage 4 chronic kidney disease, or unspecified chronic kidney disease: Secondary | ICD-10-CM | POA: Diagnosis not present

## 2017-03-07 DIAGNOSIS — E1122 Type 2 diabetes mellitus with diabetic chronic kidney disease: Secondary | ICD-10-CM | POA: Insufficient documentation

## 2017-03-07 DIAGNOSIS — I4819 Other persistent atrial fibrillation: Secondary | ICD-10-CM

## 2017-03-07 DIAGNOSIS — E119 Type 2 diabetes mellitus without complications: Secondary | ICD-10-CM | POA: Diagnosis not present

## 2017-03-07 DIAGNOSIS — Z79899 Other long term (current) drug therapy: Secondary | ICD-10-CM | POA: Insufficient documentation

## 2017-03-07 NOTE — Progress Notes (Signed)
Patient ID: KARUNA BALDUCCI, female   DOB: 14-Apr-1946, 71 y.o.   MRN: [id number]     Primary Care Physician: Lilian Coma, MD Referring Physician: [facility]   THERMA LASURE is a 71 y.o. female with a h/o DM,HTN, CKD, obesity, that presented to her PCP for routine visit and was found to be in afib with rvr and was asymptomatic. She was sent to the ER and was given a po dose of cardizem, started on eliquis po and f/u visit arranged for pt in the afib clinic.Chadsvasc score is at least 4.   In the clinic today she is in [place] with pac's. States that she feels fine, she is not aware of any irregular heart beat. She denies feeling short of breath or fatigued with usual activities. Lifestyle issues reviewed. She does not drink alcohol or smoke. Minimum caffeine. No snoring history. 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 So far, is tolerating apixaban and diltiazem.   CBC done f/u of start of eliquis and showed drop of hgb to 8.3, with previous chronic anemia. DOAC  stopped and pt referred to PCP office. Pt has adamantly refused colonoscopies in the past for unknown reasons although strongly encouraged by PCP to have a work up for the anemia. When she saw her PCP a few days later, HGB was back to 9 and iron started. Pt called office and wanted to restart eliquis. She is in office today and is in [place]. CBC will be repeated next week and if shows drop in H/H then will need to stop DOAC and GI work up again stressed.  Returns to afib clinic 7/26, she is in afib with reasonable rate control. 104 bpm when she walked into clinic, but with pulse ox at rest, her HR is in the 80's. She is unaware when she is in afib vrs SR. Antiarrythmic's discussed and she refuses. CBC drawn today shows H/H low but stable. She refuses GI w/u. She had 5 teeth pulled since last visit and held eliquis x one day.  F/u 9/20, she is in [place]. On DAOC, with some visible blood in stool in the  past but on previous occasion. Has been anemic in the past. Will get cbc today. Weight is down several pounds but pt not trying. She says it may be the result of her new DM drug that is suppose to promote weight loss.  F/u 12/20. Pt is in rate controlled afib and is asymptomatic. Denies any fatigue or exertional dyspnea. She is taking apixaban without bleeding issues. Recent labs by pcp show normal cbc. She had been anemic at one time and had noted intermittently rectal bleeding which is now resolved.  F/u in afib clinic 4/25, pt feels well. In afib but pt is unaware. No shortness of breath with exertion. Continues on eliquis, no bleeding issues.  Today, she denies symptoms of palpitations, chest pain, shortness of breath, orthopnea, PND, lower extremity edema, dizziness, presyncope, syncope, or neurologic sequela. The patient is tolerating medications without difficulties and is otherwise without complaint today.   Past Medical History:  Diagnosis Date  . Arthritis   . Diabetes mellitus without complication ([place])   . Hypertension    No past surgical history on file.  Current Outpatient Prescriptions  Medication Sig Dispense Refill  . apixaban (ELIQUIS) 5 MG TABS tablet Take 1 tablet (5 mg total) by mouth 2 (two) times daily. 60 tablet 6  . Cholecalciferol (VITAMIN  D) 2000 units tablet Take 4,000 Units by mouth daily.    Marland Kitchen diltiazem (CARDIZEM CD) 120 MG 24 hr capsule Take 1 capsule (120 mg total) by mouth 2 (two) times daily. 180 capsule 3  . ferrous sulfate 325 (65 FE) MG EC tablet Take 325 mg by mouth 2 (two) times daily.    Marland Kitchen HYDROcodone-acetaminophen (NORCO) 7.5-325 MG tablet Take 1 tablet by mouth every 6 (six) hours as needed for moderate pain.    Marland Kitchen LEVEMIR FLEXTOUCH 100 UNIT/ML Pen INJECT 10 UNITS ONCE A DAY SUBCUTANEOUSLY AS DIRECTED , DX E11.65  2  . losartan-hydrochlorothiazide (HYZAAR) 100-12.5 MG tablet Take 1 tablet by mouth daily.    . Multiple Vitamin (MULTIVITAMIN WITH  MINERALS) TABS tablet Take 1 tablet by mouth daily.    . Multiple Vitamins-Minerals (HEALTHY EYES) TABS Take 1 tablet by mouth daily.    Michel Santee FOR WOMEN 3.9 MG/24HR APPLY 1 PATCH TO SKIN TWICE WEEKLY  12  . sertraline (ZOLOFT) 100 MG tablet Take 100 mg by mouth daily.    . simethicone (MYLICON) 80 MG chewable tablet Chew 80-160 mg by mouth 2 (two) times daily as needed for flatulence.     . simvastatin (ZOCOR) 20 MG tablet Take 20 mg by mouth every evening.     . thiamine 100 MG tablet Take 1 tablet (100 mg total) by mouth daily.    . vitamin C (ASCORBIC ACID) 500 MG tablet Take 1,000 mg by mouth daily.     No current facility-administered medications for this encounter.     Allergies  Allergen Reactions  . Metformin And Related Nausea And Vomiting  . Prednisone Nausea And Vomiting    Social History   Social History  . Marital status: Divorced    Spouse name: N/A  . Number of children: N/A  . Years of education: N/A   Occupational History  . Not on file.   Social History Main Topics  . Smoking status: Never Smoker  . Smokeless tobacco: Never Used  . Alcohol use No  . Drug use: No  . Sexual activity: Not on file   Other Topics Concern  . Not on file   Social History Narrative  . No narrative on file    No family history on file.  ROS- All systems are reviewed and negative except as per the HPI above  Physical Exam: Vitals:   03/07/17 1357  BP: (!) 156/84  Pulse: 94  Weight: 195 lb 3.2 oz (88.5 kg)  Height: 5\' 3"  (1.6 m)  BP rechecked at 134/88  GEN- The patient is well appearing, alert and oriented x 3 today.   Head- normocephalic, atraumatic Eyes-  Sclera clear, conjunctiva pink Ears- hearing intact Oropharynx- clear Neck- supple, no JVP Lymph- no cervical lymphadenopathy Lungs- Clear to ausculation bilaterally, normal work of breathing Heart- Irregular rate and rhythm, no murmurs, rubs or gallops, PMI not laterally displaced GI- soft, NT, ND, +  BS Extremities- no clubbing, cyanosis, or edema MS- no significant deformity or atrophy Skin- no rash or lesion Psych- euthymic mood, full affect Neuro- strength and sensation are intact  EKG-afib at 94 bpm, qrs int 78 ms, qt int 427 ms Epic records reviewed Echo Left ventricle: The cavity size was normal. There was mild focal  basal hypertrophy of the septum. Systolic function was vigorous.  The estimated ejection fraction was in the range of 65% to 70%.  Wall motion was normal; there were no regional wall motion  abnormalities. Doppler  parameters are consistent with abnormal  left ventricular relaxation (grade 1 diastolic dysfunction).  There was no evidence of elevated ventricular filling pressure by  Doppler parameters. - Aortic valve: There was no regurgitation. - Mitral valve: There was trivial regurgitation. - Left atrium: The atrium was mildly dilated. - Right ventricle: Systolic function was normal. - Right atrium: The atrium was normal in size. - Tricuspid valve: There was mild regurgitation. - Pulmonic valve: Structurally normal valve. - Pulmonary arteries: Systolic pressure was within the normal  range. - Inferior vena cava: The vessel was normal in size. - Pericardium, extracardiac: There was no pericardial effusion.  Assessment and Plan: 1. Persistent asymptomatic afib In afib and is feeling well Refuses AAD's Continue Cardizem and Eliquis appropriately dosed at 5 mg bid. Creat clearance previously  calculated at 64. Off ASA Weight loss advised and asked to increase in physical activity  2. H/O anemia  Initial drop in H/H with addition of DOAC,but now CBC is wnl Continue apixaban 5 mg bid  F/u in 5 months Consideration for repeating echo on f/u to assess if EF has had any decline  with persistent afib  Butch Penny C. Airyana Sprunger, [facility] [address] [place], [place] [address] [phone]

## 2017-04-06 DIAGNOSIS — E1165 Type 2 diabetes mellitus with hyperglycemia: Secondary | ICD-10-CM | POA: Diagnosis not present

## 2017-04-06 DIAGNOSIS — Z794 Long term (current) use of insulin: Secondary | ICD-10-CM | POA: Diagnosis not present

## 2017-04-13 DIAGNOSIS — Z0100 Encounter for examination of eyes and vision without abnormal findings: Secondary | ICD-10-CM | POA: Diagnosis not present

## 2017-04-13 DIAGNOSIS — H524 Presbyopia: Secondary | ICD-10-CM | POA: Diagnosis not present

## 2017-06-08 DIAGNOSIS — E1121 Type 2 diabetes mellitus with diabetic nephropathy: Secondary | ICD-10-CM | POA: Diagnosis not present

## 2017-06-08 DIAGNOSIS — E1165 Type 2 diabetes mellitus with hyperglycemia: Secondary | ICD-10-CM | POA: Diagnosis not present

## 2017-06-08 DIAGNOSIS — N183 Chronic kidney disease, stage 3 (moderate): Secondary | ICD-10-CM | POA: Diagnosis not present

## 2017-06-08 DIAGNOSIS — E1365 Other specified diabetes mellitus with hyperglycemia: Secondary | ICD-10-CM | POA: Diagnosis not present

## 2017-06-08 DIAGNOSIS — Z794 Long term (current) use of insulin: Secondary | ICD-10-CM | POA: Diagnosis not present

## 2017-06-08 DIAGNOSIS — D649 Anemia, unspecified: Secondary | ICD-10-CM | POA: Diagnosis not present

## 2017-06-08 DIAGNOSIS — R7309 Other abnormal glucose: Secondary | ICD-10-CM | POA: Diagnosis not present

## 2017-08-08 ENCOUNTER — Inpatient Hospital Stay (HOSPITAL_COMMUNITY): Admission: RE | Admit: 2017-08-08 | Payer: Self-pay | Source: Ambulatory Visit | Admitting: Nurse Practitioner

## 2017-08-22 ENCOUNTER — Ambulatory Visit (HOSPITAL_COMMUNITY)
Admission: RE | Admit: 2017-08-22 | Discharge: 2017-08-22 | Disposition: A | Discharge status: Home / Self Care | Payer: Commercial Managed Care - HMO | Source: Ambulatory Visit | Attending: Nurse Practitioner | Admitting: Nurse Practitioner

## 2017-08-22 ENCOUNTER — Encounter (HOSPITAL_COMMUNITY): Payer: Self-pay | Admitting: Nurse Practitioner

## 2017-08-22 VITALS — BP 118/62 | HR 121 | Ht 63.0 in | Wt 203.0 lb

## 2017-08-22 DIAGNOSIS — Z794 Long term (current) use of insulin: Secondary | ICD-10-CM | POA: Insufficient documentation

## 2017-08-22 DIAGNOSIS — I4891 Unspecified atrial fibrillation: Secondary | ICD-10-CM | POA: Diagnosis not present

## 2017-08-22 DIAGNOSIS — I482 Chronic atrial fibrillation, unspecified: Secondary | ICD-10-CM

## 2017-08-22 DIAGNOSIS — E119 Type 2 diabetes mellitus without complications: Secondary | ICD-10-CM | POA: Diagnosis not present

## 2017-08-22 DIAGNOSIS — Z79899 Other long term (current) drug therapy: Secondary | ICD-10-CM | POA: Insufficient documentation

## 2017-08-22 DIAGNOSIS — I10 Essential (primary) hypertension: Secondary | ICD-10-CM | POA: Insufficient documentation

## 2017-08-22 DIAGNOSIS — D649 Anemia, unspecified: Secondary | ICD-10-CM | POA: Diagnosis not present

## 2017-08-22 MED ORDER — METOPROLOL TARTRATE 25 MG PO TABS: 12.5000 mg | ORAL_TABLET | Freq: Two times a day (BID) | ORAL | 2 refills | Status: DC

## 2017-08-22 NOTE — Progress Notes (Signed)
Patient ID: Cassandra Allen, female   DOB: January 01, 1946, 71 y.o.   MRN: [id number]     Primary Care Physician: [name], MD Referring Physician: [facility]   Cassandra Allen is a 71 y.o. female with a h/o DM,HTN, CKD, obesity, that presented to her PCP for routine visit and was found to be in afib with rvr and was asymptomatic. She was sent to the ER and was given a po dose of cardizem, started on eliquis po and f/u visit arranged for pt in the afib clinic.Chadsvasc score is at least 4.   In the clinic today she is in [place] with pac's. States that she feels fine, she is not aware of any irregular heart beat. She denies feeling short of breath or fatigued with usual activities. Lifestyle issues reviewed. She does not drink alcohol or smoke. Minimum caffeine. No snoring history. 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 So far, is tolerating apixaban and diltiazem.   CBC done f/u of start of eliquis and showed drop of hgb to 8.3, with previous chronic anemia. DOAC  stopped and pt referred to PCP office. Pt has adamantly refused colonoscopies in the past for unknown reasons although strongly encouraged by PCP to have a work up for the anemia. When she saw her PCP a few days later, HGB was back to 9 and iron started. Pt called office and wanted to restart eliquis. She is in office today and is in [place]. CBC will be repeated next week and if shows drop in H/H then will need to stop DOAC and GI work up again stressed.  Returns to afib clinic 7/26, she is in afib with reasonable rate control. 104 bpm when she walked into clinic, but with pulse ox at rest, her HR is in the 80's. She is unaware when she is in afib vrs SR. Antiarrythmic's discussed and she refuses. CBC drawn today shows H/H low but stable. She refuses GI w/u. She had 5 teeth pulled since last visit and held eliquis x one day.  F/u 9/20, she is in [place]. On DAOC, with some visible blood in stool in the  past but on previous occasion. Has been anemic in the past. Will get cbc today. Weight is down several pounds but pt not trying. She says it may be the result of her new DM drug that is suppose to promote weight loss.  F/u 12/20. Pt is in rate controlled afib and is asymptomatic. Denies any fatigue or exertional dyspnea. She is taking apixaban without bleeding issues. Recent labs by pcp show normal cbc. She had been anemic at one time and had noted intermittently rectal bleeding which is now resolved.  F/u in afib clinic 4/25, pt feels well. In afib but pt is unaware. No shortness of breath with exertion. Continues on eliquis, no bleeding issues.  F/u in afib clinic 10/10,and is in afib with RVR. She still is not aware. Has not felt as well last week 2/2 cold.  Continues on Eliquis, states no rectal bleeding. Weight is up 8 lbs but pt admits she has been eting more and has been less active.  Today, she denies symptoms of palpitations, chest pain, shortness of breath, orthopnea, PND, lower extremity edema, dizziness, presyncope, syncope, or neurologic sequela. The patient is tolerating medications without difficulties and is otherwise without complaint today.   Past Medical History:  Diagnosis Date  . Arthritis   . Diabetes mellitus without  complication ([place])   . Hypertension    No past surgical history on file.  Current Outpatient Prescriptions  Medication Sig Dispense Refill  . apixaban (ELIQUIS) 5 MG TABS tablet Take 1 tablet (5 mg total) by mouth 2 (two) times daily. 60 tablet 6  . Cholecalciferol (VITAMIN D) 2000 units tablet Take 4,000 Units by mouth daily.    Marland Kitchen diltiazem (CARDIZEM CD) 120 MG 24 hr capsule Take 1 capsule (120 mg total) by mouth 2 (two) times daily. 180 capsule 3  . ferrous sulfate 325 (65 FE) MG EC tablet Take 325 mg by mouth 2 (two) times daily.    Marland Kitchen HYDROcodone-acetaminophen (NORCO) 7.5-325 MG tablet Take 1 tablet by mouth every 6 (six) hours as needed for moderate  pain.    Marland Kitchen LEVEMIR FLEXTOUCH 100 UNIT/ML Pen INJECT 10 UNITS ONCE A DAY SUBCUTANEOUSLY AS DIRECTED , DX E11.65  2  . losartan-hydrochlorothiazide (HYZAAR) 100-12.5 MG tablet Take 1 tablet by mouth daily.    . Multiple Vitamin (MULTIVITAMIN WITH MINERALS) TABS tablet Take 1 tablet by mouth daily.    . Multiple Vitamins-Minerals (HEALTHY EYES) TABS Take 1 tablet by mouth daily.    Michel Santee FOR WOMEN 3.9 MG/24HR APPLY 1 PATCH TO SKIN TWICE WEEKLY  12  . sertraline (ZOLOFT) 100 MG tablet Take 100 mg by mouth daily.    . simethicone (MYLICON) 80 MG chewable tablet Chew 80-160 mg by mouth 2 (two) times daily as needed for flatulence.     . simvastatin (ZOCOR) 20 MG tablet Take 20 mg by mouth every evening.     . thiamine 100 MG tablet Take 1 tablet (100 mg total) by mouth daily.    . vitamin C (ASCORBIC ACID) 500 MG tablet Take 1,000 mg by mouth daily.    . metoprolol tartrate (LOPRESSOR) 25 MG tablet Take 0.5 tablets (12.5 mg total) by mouth 2 (two) times daily. 60 tablet 2   No current facility-administered medications for this encounter.     Allergies  Allergen Reactions  . Metformin And Related Nausea And Vomiting  . Prednisone Nausea And Vomiting    Social History   Social History  . Marital status: Divorced    Spouse name: N/A  . Number of children: N/A  . Years of education: N/A   Occupational History  . Not on file.   Social History Main Topics  . Smoking status: Never Smoker  . Smokeless tobacco: Never Used  . Alcohol use No  . Drug use: No  . Sexual activity: Not on file   Other Topics Concern  . Not on file   Social History Narrative  . No narrative on file    No family history on file.  ROS- All systems are reviewed and negative except as per the HPI above  Physical Exam: Vitals:   08/22/17 1329  BP: 118/62  Pulse: (!) 121  Weight: 203 lb (92.1 kg)  Height: 5\' 3"  (1.6 m)  BP rechecked at 134/88  GEN- The patient is well appearing, alert and oriented  x 3 today.   Head- normocephalic, atraumatic Eyes-  Sclera clear, conjunctiva pink Ears- hearing intact Oropharynx- clear Neck- supple, no JVP Lymph- no cervical lymphadenopathy Lungs- Clear to ausculation bilaterally, normal work of breathing Heart- Irregular rate and rhythm, no murmurs, rubs or gallops, PMI not laterally displaced GI- soft, NT, ND, + BS Extremities- no clubbing, cyanosis, or edema MS- no significant deformity or atrophy Skin- no rash or lesion Psych- euthymic mood, full  affect Neuro- strength and sensation are intact  EKG-afib at 121 ms, qrs int 72 ms, qtc 426 ms Epic records reviewed Echo Left ventricle: The cavity size was normal. There was mild focal  basal hypertrophy of the septum. Systolic function was vigorous.  The estimated ejection fraction was in the range of 65% to 70%.  Wall motion was normal; there were no regional wall motion  abnormalities. Doppler parameters are consistent with abnormal  left ventricular relaxation (grade 1 diastolic dysfunction).  There was no evidence of elevated ventricular filling pressure by  Doppler parameters. - Aortic valve: There was no regurgitation. - Mitral valve: There was trivial regurgitation. - Left atrium: The atrium was mildly dilated. - Right ventricle: Systolic function was normal. - Right atrium: The atrium was normal in size. - Tricuspid valve: There was mild regurgitation. - Pulmonic valve: Structurally normal valve. - Pulmonary arteries: Systolic pressure was within the normal  range. - Inferior vena cava: The vessel was normal in size. - Pericardium, extracardiac: There was no pericardial effusion.  Assessment and Plan: 1. Persistent asymptomatic afib Continues in afib but with RVR Has refused antiarrythmic drugs in the past Will add metoprolol 25 mg 1/2 tab bid Continue Cardizem and Eliquis appropriately dosed at 5 mg bid. Creat clearance previously  calculated at 64.  Weight loss  advised and asked to increase in physical activity  2. H/O anemia  States no bleeding issues Continue apixaban 5 mg bid for chadsvasc score of at least 4  F/u  In one week for EKG, when rate better controlled will repeat echo   Butch Penny C. Damisha Wolff, [facility] [address] [place], [place] [address] [phone]

## 2017-08-22 NOTE — Patient Instructions (Signed)
Your physician has recommended you make the following change in your medication:  1)metoprolol 12.5mg  twice a day (1/2 tablet of your 25mg  tablet twice a day)

## 2017-08-23 DIAGNOSIS — N183 Chronic kidney disease, stage 3 (moderate): Secondary | ICD-10-CM | POA: Diagnosis not present

## 2017-08-23 DIAGNOSIS — I482 Chronic atrial fibrillation: Secondary | ICD-10-CM | POA: Diagnosis not present

## 2017-08-23 DIAGNOSIS — M25562 Pain in left knee: Secondary | ICD-10-CM | POA: Diagnosis not present

## 2017-08-23 DIAGNOSIS — J209 Acute bronchitis, unspecified: Secondary | ICD-10-CM | POA: Diagnosis not present

## 2017-08-23 DIAGNOSIS — G8929 Other chronic pain: Secondary | ICD-10-CM | POA: Diagnosis not present

## 2017-08-23 DIAGNOSIS — E1121 Type 2 diabetes mellitus with diabetic nephropathy: Secondary | ICD-10-CM | POA: Diagnosis not present

## 2017-08-27 DIAGNOSIS — J209 Acute bronchitis, unspecified: Secondary | ICD-10-CM | POA: Diagnosis not present

## 2017-08-29 ENCOUNTER — Encounter (HOSPITAL_COMMUNITY): Payer: Self-pay | Admitting: Nurse Practitioner

## 2017-09-06 ENCOUNTER — Encounter (HOSPITAL_COMMUNITY): Payer: Self-pay | Admitting: Nurse Practitioner

## 2017-09-07 ENCOUNTER — Ambulatory Visit (HOSPITAL_COMMUNITY)
Admission: RE | Admit: 2017-09-07 | Discharge: 2017-09-07 | Disposition: A | Discharge status: Home / Self Care | Payer: Commercial Managed Care - HMO | Source: Ambulatory Visit | Attending: Nurse Practitioner | Admitting: Nurse Practitioner

## 2017-09-07 DIAGNOSIS — I4891 Unspecified atrial fibrillation: Secondary | ICD-10-CM | POA: Insufficient documentation

## 2017-09-07 MED ORDER — METOPROLOL TARTRATE 25 MG PO TABS: 12.5000 mg | ORAL_TABLET | Freq: Two times a day (BID) | ORAL | 6 refills | Status: DC

## 2017-09-07 NOTE — Progress Notes (Addendum)
Pt in for repeat EKG after starting Metoprolol 12.5 mg twice a day.   EKG to be reviewed by Ceasar Lund  On last visit, pt had afib with RVR. OPt usually lives in afib and was unaware of the fast rate. Started BB and afib is now rate controlled at 87 bpm, qrs int 74 ms, qtc 435 ms. F/u in 6 months

## 2017-10-12 DIAGNOSIS — R600 Localized edema: Secondary | ICD-10-CM | POA: Diagnosis not present

## 2017-10-12 DIAGNOSIS — G894 Chronic pain syndrome: Secondary | ICD-10-CM | POA: Diagnosis not present

## 2017-11-16 DIAGNOSIS — F322 Major depressive disorder, single episode, severe without psychotic features: Secondary | ICD-10-CM | POA: Diagnosis not present

## 2017-11-16 DIAGNOSIS — N183 Chronic kidney disease, stage 3 (moderate): Secondary | ICD-10-CM | POA: Diagnosis not present

## 2017-11-16 DIAGNOSIS — E1121 Type 2 diabetes mellitus with diabetic nephropathy: Secondary | ICD-10-CM | POA: Diagnosis not present

## 2017-11-16 DIAGNOSIS — I10 Essential (primary) hypertension: Secondary | ICD-10-CM | POA: Diagnosis not present

## 2017-11-16 DIAGNOSIS — D649 Anemia, unspecified: Secondary | ICD-10-CM | POA: Diagnosis not present

## 2017-11-16 DIAGNOSIS — Z794 Long term (current) use of insulin: Secondary | ICD-10-CM | POA: Diagnosis not present

## 2017-11-16 DIAGNOSIS — N3281 Overactive bladder: Secondary | ICD-10-CM | POA: Diagnosis not present

## 2017-11-16 DIAGNOSIS — E1165 Type 2 diabetes mellitus with hyperglycemia: Secondary | ICD-10-CM | POA: Diagnosis not present

## 2017-11-22 ENCOUNTER — Other Ambulatory Visit (HOSPITAL_COMMUNITY): Payer: Self-pay | Admitting: *Deleted

## 2017-11-22 MED ORDER — DILTIAZEM HCL ER COATED BEADS 120 MG PO CP24: 120.0000 mg | ORAL_CAPSULE | Freq: Two times a day (BID) | ORAL | 3 refills | Status: DC

## 2017-12-02 DIAGNOSIS — M19071 Primary osteoarthritis, right ankle and foot: Secondary | ICD-10-CM | POA: Diagnosis not present

## 2017-12-02 DIAGNOSIS — M722 Plantar fascial fibromatosis: Secondary | ICD-10-CM | POA: Diagnosis not present

## 2017-12-07 DIAGNOSIS — Z794 Long term (current) use of insulin: Secondary | ICD-10-CM | POA: Diagnosis not present

## 2017-12-07 DIAGNOSIS — E1165 Type 2 diabetes mellitus with hyperglycemia: Secondary | ICD-10-CM | POA: Diagnosis not present

## 2017-12-07 DIAGNOSIS — M79671 Pain in right foot: Secondary | ICD-10-CM | POA: Diagnosis not present

## 2017-12-28 DIAGNOSIS — M25571 Pain in right ankle and joints of right foot: Secondary | ICD-10-CM | POA: Diagnosis not present

## 2018-01-02 ENCOUNTER — Encounter (INDEPENDENT_AMBULATORY_CARE_PROVIDER_SITE_OTHER): Payer: Self-pay | Admitting: Orthopedic Surgery

## 2018-01-02 ENCOUNTER — Ambulatory Visit (INDEPENDENT_AMBULATORY_CARE_PROVIDER_SITE_OTHER): Payer: Medicare HMO | Admitting: Orthopedic Surgery

## 2018-01-02 ENCOUNTER — Ambulatory Visit (INDEPENDENT_AMBULATORY_CARE_PROVIDER_SITE_OTHER): Payer: Medicare HMO

## 2018-01-02 VITALS — Ht 63.0 in | Wt 203.0 lb

## 2018-01-02 DIAGNOSIS — M25571 Pain in right ankle and joints of right foot: Secondary | ICD-10-CM

## 2018-01-02 DIAGNOSIS — M19171 Post-traumatic osteoarthritis, right ankle and foot: Secondary | ICD-10-CM

## 2018-01-02 MED ORDER — METHYLPREDNISOLONE ACETATE 40 MG/ML IJ SUSP
40.0000 mg | INTRAMUSCULAR | Status: AC | PRN
  Administered 2018-01-02: 40 mg via INTRA_ARTICULAR

## 2018-01-02 MED ORDER — LIDOCAINE HCL 1 % IJ SOLN
2.0000 mL | INTRAMUSCULAR | Status: AC | PRN
  Administered 2018-01-02: 2 mL

## 2018-01-02 NOTE — Progress Notes (Signed)
Office Visit Note   Patient: Cassandra Allen           Date of Birth: 08-21-1946           MRN: [id number] Visit Date: 01/02/2018              Requested by: [name], MD [address] [place], [place] [address] PCP: [name], MD  Chief Complaint  Patient presents with  . Right Ankle - Pain      HPI: Patient is a 72 year old woman who presents with traumatic arthritis of her right ankle she has been having persistent pain worse over the past month.  Patient states she had old trauma when she was a child.  She currently ambulates with a cane.  She has used narcotic pain medicine in the past and has used prednisone as well.  Assessment & Plan: Visit Diagnoses:  1. Pain in right ankle and joints of right foot   2. Post-traumatic osteoarthritis, right ankle and foot     Plan: The right ankle was injected.  Discussed that if she gets temporary relief we could proceed with repeat injections.  Discussed that she has no relief at all reviewed and proceed with arthroscopic debridement for arthroscopic fusion.  Follow-Up Instructions: No Follow-up on file.   Ortho Exam  Patient is alert, oriented, no adenopathy, well-dressed, normal affect, normal respiratory effort. Examination patient has an antalgic gait she uses a cane she has a palpable pulse she has pitting edema up to the tibial tubercle with brawny edema of the calf consistent with chronic venous insufficiency she states she has compression stockings but she cannot get them on.  She is tender to palpation anteriorly and laterally over the ankle joint.  She has minimal range of motion of the ankle and subtalar joint.  There is no pain with attempted subtalar motion.  Imaging: Xr Ankle Complete Right  Result Date: 01/02/2018 3 view radiographs of the right ankle shows calcification of the fascial layer she does have large osteophytic bone spurs anteriorly and posteriorly to the ankle joint.  The  joint space is narrowed.  No images are attached to the encounter.  Labs: Lab Results  Component Value Date   HGBA1C 7.0 (H) 03/26/2015   ESRSEDRATE 45 (H) 03/26/2015   REPTSTATUS 03/28/2015 FINAL 03/25/2015   CULT  03/25/2015    ESCHERICHIA COLI Performed at [facility] 03/25/2015    @LABSALLVALUES (HGBA1)@  Body mass index is 35.96 kg/m.  Orders:  Orders Placed This Encounter  Procedures  . Medium Joint Inj  . XR Ankle Complete Right   No orders of the defined types were placed in this encounter.    Procedures: Medium Joint Inj: R ankle on 01/02/2018 2:47 PM Indications: pain and diagnostic evaluation Details: 22 G 1.5 in needle, anteromedial approach Medications: 2 mL lidocaine 1 %; 40 mg methylPREDNISolone acetate 40 MG/ML Outcome: tolerated well, no immediate complications Procedure, treatment alternatives, risks and benefits explained, specific risks discussed. Consent was given by the patient. Immediately prior to procedure a time out was called to verify the correct patient, procedure, equipment, support staff and site/side marked as required. Patient was prepped and draped in the usual sterile fashion.      Clinical Data: No additional findings.  ROS:  All other systems negative, except as noted in the HPI. Review of Systems  Objective: Vital Signs: Ht 5\' 3"  (1.6 m)   Wt 203 lb (  92.1 kg)   BMI 35.96 kg/m   Specialty Comments:  No specialty comments available.  PMFS History: Patient Active Problem List   Diagnosis Date Noted  . Acute renal failure ([place]) 03/25/2015  . Essential (primary) hypertension 03/25/2015  . Diabetes mellitus without complication ([place]) 10/62/6948  . AKI (acute kidney injury) ([place]) 03/25/2015   Past Medical History:  Diagnosis Date  . Arthritis   . Diabetes mellitus without complication ([place])   . Hypertension     History reviewed. No pertinent family history.  History reviewed. No  pertinent surgical history. Social History   Occupational History  . Not on file  Tobacco Use  . Smoking status: Never Smoker  . Smokeless tobacco: Never Used  Substance and Sexual Activity  . Alcohol use: No  . Drug use: No  . Sexual activity: Not on file

## 2018-01-07 ENCOUNTER — Telehealth (INDEPENDENT_AMBULATORY_CARE_PROVIDER_SITE_OTHER): Payer: Self-pay | Admitting: Orthopedic Surgery

## 2018-01-07 NOTE — Telephone Encounter (Signed)
Patient states cortisone injection did not help her one bit, requesting a pain medication or anything else that will help her. Please advise # [phone]

## 2018-01-08 ENCOUNTER — Telehealth (INDEPENDENT_AMBULATORY_CARE_PROVIDER_SITE_OTHER): Payer: Self-pay | Admitting: Orthopedic Surgery

## 2018-01-08 NOTE — Telephone Encounter (Signed)
This has been done.

## 2018-01-08 NOTE — Telephone Encounter (Signed)
I called pt to advise that Dr. Sharol Given would like for her to come into the office and discuss treatment options. appt for Thursday at 10am

## 2018-01-08 NOTE — Telephone Encounter (Signed)
We will plan to proceed with arthroscopic debridement.

## 2018-01-08 NOTE — Telephone Encounter (Signed)
Patient called back today stating that she is still in a lot of pain and is using a cane to walk. She was wondering if Dr. Sharol Given could possibly prescribe her something for the pain. CB # [phone]

## 2018-01-08 NOTE — Telephone Encounter (Signed)
Pt states that ankle injection was not helpful and is requesting pain medication or whatever next steps should be. Last had rx for vicodin 7.5/325 11/15/17 #60. In office note suggested possible surgical intervention if injection was not beneficial. Please advise.

## 2018-01-10 ENCOUNTER — Ambulatory Visit (INDEPENDENT_AMBULATORY_CARE_PROVIDER_SITE_OTHER): Payer: Medicare HMO | Admitting: Orthopedic Surgery

## 2018-01-10 ENCOUNTER — Encounter (INDEPENDENT_AMBULATORY_CARE_PROVIDER_SITE_OTHER): Payer: Self-pay | Admitting: Orthopedic Surgery

## 2018-01-10 VITALS — Ht 63.0 in | Wt 203.0 lb

## 2018-01-10 DIAGNOSIS — I87321 Chronic venous hypertension (idiopathic) with inflammation of right lower extremity: Secondary | ICD-10-CM

## 2018-01-10 DIAGNOSIS — M25571 Pain in right ankle and joints of right foot: Secondary | ICD-10-CM

## 2018-01-10 NOTE — Progress Notes (Signed)
Office Visit Note   Patient: Cassandra Allen           Date of Birth: 26-Jan-1946           MRN: [id number] Visit Date: 01/10/2018              Requested by: [name], MD [address] [place], [place] [address] PCP: [name], MD  Chief Complaint  Patient presents with  . Right Ankle - Follow-up    S/p cortisone injection 01/02/18      HPI: Patient presents in follow-up for traumatic arthritis right ankle with chronic venous insufficiency right lower extremity she states that the injection may have helped a few days.  She states her pain primarily is now over the heel and states that she cannot weight-bear due to her heel pain.  Assessment & Plan: Visit Diagnoses:  1. Pain in right ankle and joints of right foot   2. 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  The fact that her pain has moved to her heel indicates that all of her symptoms may not be coming from the tibiotalar joint.  Will wrap the right leg follow-up in 1 week and reevaluate she will bring her compression stockings with this she may need new compression stockings.  Follow-Up Instructions: Return in about 1 week (around 01/17/2018).   Ortho Exam  Patient is alert, oriented, no adenopathy, well-dressed, normal affect, normal respiratory effort. Examination patient has dermatitis with massive pitting edema in the right lower extremity there is no pain with passive range of motion of the ankle she is tender to palpation anteriorly over the ankle she states her pain primarily is in the heel but she has no pain to palpation over the Achilles or the plantar fascia or the calcaneus.  Discussed the importance of elevation and the importance of  walking to mechanically pump the fluid up her legs discussed the importance of not letting her legs hanging dependently there was standing or sitting.  Imaging: No results found. No images are attached to the encounter.  Labs: Lab Results  Component Value Date   HGBA1C 7.0 (H) 03/26/2015   ESRSEDRATE 45 (H) 03/26/2015   REPTSTATUS 03/28/2015 FINAL 03/25/2015   CULT  03/25/2015    ESCHERICHIA COLI Performed at [facility] 03/25/2015    @LABSALLVALUES (HGBA1)@  Body mass index is 35.96 kg/m.  Orders:  No orders of the defined types were placed in this encounter.  No orders of the defined types were placed in this encounter.    Procedures: No procedures performed  Clinical Data: No additional findings.  ROS:  All other systems negative, except as noted in the HPI. Review of Systems  Objective: Vital Signs: Ht 5\' 3"  (1.6 m)   Wt 203 lb (92.1 kg)   BMI 35.96 kg/m   Specialty Comments:  No specialty comments available.  PMFS History: Patient Active Problem List   Diagnosis Date Noted  . 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 Pain in right ankle and joints of right foot 01/10/2018  . Acute renal failure ([place]) 03/25/2015  . Essential (primary) hypertension 03/25/2015  . Diabetes mellitus without complication (  [place]) 03/25/2015  . AKI (acute kidney injury) ([place]) 03/25/2015   Past Medical History:  Diagnosis Date  . Arthritis   . Diabetes mellitus without complication ([place])   . Hypertension     History reviewed. No pertinent family history.  History reviewed. No pertinent surgical history. Social History   Occupational History  . Not on file  Tobacco Use  . Smoking status: Never Smoker  . Smokeless tobacco: Never Used  Substance and Sexual Activity  . Alcohol use: No  . Drug use: No  . Sexual activity: Not on file

## 2018-01-10 NOTE — Telephone Encounter (Signed)
Cassandra Allen has an appointment in our office today.  I spoke with her in the office and told her that I would be happy to schedule her surgery in the office after her appointment today or that we could schedule over the phone.

## 2018-01-17 ENCOUNTER — Encounter (INDEPENDENT_AMBULATORY_CARE_PROVIDER_SITE_OTHER): Payer: Self-pay | Admitting: Orthopedic Surgery

## 2018-01-17 ENCOUNTER — Ambulatory Visit (INDEPENDENT_AMBULATORY_CARE_PROVIDER_SITE_OTHER): Payer: Medicare HMO | Admitting: Orthopedic Surgery

## 2018-01-17 VITALS — Ht 63.0 in | Wt 203.0 lb

## 2018-01-17 DIAGNOSIS — M25571 Pain in right ankle and joints of right foot: Secondary | ICD-10-CM | POA: Diagnosis not present

## 2018-01-17 DIAGNOSIS — I87321 Chronic venous hypertension (idiopathic) with inflammation of right lower extremity: Secondary | ICD-10-CM

## 2018-01-17 NOTE — Progress Notes (Signed)
Office Visit Note   Patient: Cassandra Allen           Date of Birth: 1946-01-29           MRN: [id number] Visit Date: 01/17/2018              Requested by: [name], MD [address] [place], [place] [address] PCP: [name], MD  Chief Complaint  Patient presents with  . Right Ankle - Follow-up      HPI: Patient is seen in follow-up for venous insufficiency as well as impingement syndrome of the right ankle.  She has been a Dynaflex wrap which has provided remarkable improvement of her swelling she states she still has pain with activities of daily living with her right ankle.  Assessment & Plan: Visit Diagnoses:  1. Pain in right ankle and joints of right foot   2. 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  The importance of not leaving a regular sock was discussed.  We will plan to schedule for outpatient right ankle arthroscopy.  Patient will need to be out of work on short-term disability postoperatively.  Follow-Up Instructions: Return in about 2 weeks (around 01/31/2018).   Ortho Exam  Patient is alert, oriented, no adenopathy, well-dressed, normal affect, normal respiratory effort. Examination patient has good wrinkling of the skin there is no redness no cellulitis.  There are no ulcers no drainage.  She has a good dorsalis pedis pulse she is tender to palpation of the ankle.  Previous injections provided her no relief.  Imaging: No results found. No images are attached to the encounter.  Labs: Lab Results  Component Value Date   HGBA1C 7.0 (H) 03/26/2015   ESRSEDRATE 45 (H) 03/26/2015   REPTSTATUS 03/28/2015 FINAL 03/25/2015   CULT  03/25/2015    ESCHERICHIA COLI Performed at [facility] 03/25/2015     @LABSALLVALUES (HGBA1)@  Body mass index is 35.96 kg/m.  Orders:  No orders of the defined types were placed in this encounter.  No orders of the defined types were placed in this encounter.    Procedures: No procedures performed  Clinical Data: No additional findings.  ROS:  All other systems negative, except as noted in the HPI. Review of Systems  Objective: Vital Signs: Ht 5\' 3"  (1.6 m)   Wt 203 lb (92.1 kg)   BMI 35.96 kg/m   Specialty Comments:  No specialty comments available.  PMFS History: Patient Active Problem List   Diagnosis Date Noted  . 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 Pain in right ankle and joints of right foot 01/10/2018  . Acute renal failure ([place]) 03/25/2015  . Essential (primary) hypertension 03/25/2015  . Diabetes mellitus without complication ([place]) 43/32/9518  . AKI (acute kidney injury) ([place]) 03/25/2015   Past Medical History:  Diagnosis Date  . Arthritis   . Diabetes mellitus without complication ([place])   . Hypertension     History reviewed. No pertinent family history.  History reviewed. No pertinent surgical history. Social History   Occupational History  . Not on file  Tobacco Use  . Smoking status: Never Smoker  . Smokeless tobacco: Never Used  Substance and Sexual Activity  . Alcohol use: No  . Drug use: No  . Sexual  activity: Not on file

## 2018-01-31 ENCOUNTER — Telehealth (INDEPENDENT_AMBULATORY_CARE_PROVIDER_SITE_OTHER): Payer: Self-pay | Admitting: Orthopedic Surgery

## 2018-01-31 NOTE — Telephone Encounter (Signed)
Patient stated that she did not receive the Vicodin from Dr. Stephanie Acre and is really needing something for the pain.  Thank you.

## 2018-01-31 NOTE — Telephone Encounter (Signed)
Patient calling to let you know that she has increased swelling and the pain is not getting any better. She is scheduled for surgery April 5th.  She is requesting something for pain and/or for the swelling.  If Rx is called in please send to [facility].

## 2018-01-31 NOTE — Telephone Encounter (Signed)
Called and lm to advise that she has just received an rx for Vicodin 7.5/325 #60 30 day supply from Dr. Stephanie Acre and that we are going to have to wait until her Surgery before we can give rx for pain.

## 2018-01-31 NOTE — Telephone Encounter (Signed)
I called [facility] [address] and confirmed the pt did pick up the rx. Called the pt and advised that we can not write rx until after surgery. Pt states that she does not take the Vicodin bc it no longer does anything for her. I advised that we can not write for anything else or anything stronger because what she is given after surgery will be ineffective to manage her pain. Pt voiced understanding and will call with any other questions.

## 2018-02-05 ENCOUNTER — Other Ambulatory Visit (INDEPENDENT_AMBULATORY_CARE_PROVIDER_SITE_OTHER): Payer: Self-pay | Admitting: Orthopedic Surgery

## 2018-02-05 DIAGNOSIS — M12571 Traumatic arthropathy, right ankle and foot: Secondary | ICD-10-CM

## 2018-02-06 ENCOUNTER — Other Ambulatory Visit: Payer: Self-pay

## 2018-02-06 ENCOUNTER — Encounter (HOSPITAL_COMMUNITY): Payer: Self-pay | Admitting: Nurse Practitioner

## 2018-02-06 ENCOUNTER — Ambulatory Visit (HOSPITAL_COMMUNITY)
Admission: RE | Admit: 2018-02-06 | Discharge: 2018-02-06 | Disposition: A | Discharge status: Home / Self Care | Payer: Medicare HMO | Source: Ambulatory Visit | Attending: Nurse Practitioner | Admitting: Nurse Practitioner

## 2018-02-06 VITALS — BP 124/56 | HR 111 | Ht 63.0 in | Wt 196.0 lb

## 2018-02-06 DIAGNOSIS — Z79899 Other long term (current) drug therapy: Secondary | ICD-10-CM | POA: Diagnosis not present

## 2018-02-06 DIAGNOSIS — I10 Essential (primary) hypertension: Secondary | ICD-10-CM | POA: Insufficient documentation

## 2018-02-06 DIAGNOSIS — I482 Chronic atrial fibrillation, unspecified: Secondary | ICD-10-CM

## 2018-02-06 DIAGNOSIS — Z01810 Encounter for preprocedural cardiovascular examination: Secondary | ICD-10-CM | POA: Diagnosis not present

## 2018-02-06 DIAGNOSIS — M1990 Unspecified osteoarthritis, unspecified site: Secondary | ICD-10-CM | POA: Diagnosis not present

## 2018-02-06 DIAGNOSIS — Z888 Allergy status to other drugs, medicaments and biological substances status: Secondary | ICD-10-CM | POA: Insufficient documentation

## 2018-02-06 DIAGNOSIS — E119 Type 2 diabetes mellitus without complications: Secondary | ICD-10-CM | POA: Diagnosis not present

## 2018-02-06 DIAGNOSIS — Z7901 Long term (current) use of anticoagulants: Secondary | ICD-10-CM | POA: Insufficient documentation

## 2018-02-06 DIAGNOSIS — E1165 Type 2 diabetes mellitus with hyperglycemia: Secondary | ICD-10-CM | POA: Diagnosis not present

## 2018-02-06 DIAGNOSIS — N183 Chronic kidney disease, stage 3 (moderate): Secondary | ICD-10-CM | POA: Diagnosis not present

## 2018-02-06 DIAGNOSIS — Z794 Long term (current) use of insulin: Secondary | ICD-10-CM | POA: Insufficient documentation

## 2018-02-06 DIAGNOSIS — E1121 Type 2 diabetes mellitus with diabetic nephropathy: Secondary | ICD-10-CM | POA: Diagnosis not present

## 2018-02-06 DIAGNOSIS — Z79891 Long term (current) use of opiate analgesic: Secondary | ICD-10-CM | POA: Diagnosis not present

## 2018-02-06 DIAGNOSIS — D649 Anemia, unspecified: Secondary | ICD-10-CM | POA: Diagnosis not present

## 2018-02-06 DIAGNOSIS — Z6834 Body mass index (BMI) 34.0-34.9, adult: Secondary | ICD-10-CM | POA: Insufficient documentation

## 2018-02-06 NOTE — Progress Notes (Signed)
Primary Care Physician: [name], MD  Cassandra Allen is a 72 y.o. female with a history of permanent atrial fibrillation who presents for follow-up in the [facility].  She is doing well at this time.   Today, she denies symptoms of palpitations, chest pain, shortness of breath, orthopnea, PND, lower extremity edema, dizziness, presyncope, syncope, snoring, daytime somnolence, bleeding, or neurologic sequela. The patient is tolerating medications without difficulties and is otherwise without complaint today.  Works at [employer].  Recent R foot issues,  Planning to have foot surgery in 2 weeks.  Atrial Fibrillation Risk Factors:  she  denies symptoms, no prior sleep study  she does not have a history of rheumatic fever.  she does not have a history of alcohol use.  she has a BMI of Body mass index is 34.72 kg/m.Marland Kitchen Filed Weights   02/06/18 1546  Weight: 196 lb (88.9 kg)     Atrial Fibrillation Management history:  Previous antiarrhythmic drugs: none (pt refused)  Previous cardioversions: none  Previous ablations: none  CHADS2VASC score: 4*  Anticoagulation history: on eliquis   Past Medical History:  Diagnosis Date  . Arthritis   . Diabetes mellitus without complication ([place])   . Hypertension    No past surgical history on file.  Current Outpatient Medications  Medication Sig Dispense Refill  . apixaban (ELIQUIS) 5 MG TABS tablet Take 1 tablet (5 mg total) by mouth 2 (two) times daily. 60 tablet 6  . beta carotene w/minerals (OCUVITE) tablet Take 1 tablet by mouth daily at 12 noon.    . Cholecalciferol (VITAMIN D) 2000 units tablet Take 2,000 Units by mouth 2 (two) times daily.     Marland Kitchen diltiazem (CARDIZEM CD) 120 MG 24 hr capsule Take 1 capsule (120 mg total) by mouth 2 (two) times daily. 180 capsule 3  . ferrous sulfate 325 (65 FE) MG EC tablet Take 325 mg by mouth 2 (two) times daily.    . Insulin Detemir (LEVEMIR FLEXTOUCH) 100  UNIT/ML Pen Inject 10 Units into the skin daily.    Marland Kitchen losartan-hydrochlorothiazide (HYZAAR) 100-12.5 MG tablet Take 1 tablet by mouth daily.    . Multiple Vitamin (MULTIVITAMIN WITH MINERALS) TABS tablet Take 1 tablet by mouth daily at 12 noon.     Marland Kitchen oxybutynin (DITROPAN) 5 MG tablet Take 20 mg by mouth daily.    Marland Kitchen oxybutynin (OXYTROL) 3.9 MG/24HR Place 1 patch onto the skin 2 (two) times a week.    . sertraline (ZOLOFT) 100 MG tablet Take 100 mg by mouth daily at 12 noon.     . simethicone (MYLICON) 80 MG chewable tablet Chew 80-160 mg by mouth 2 (two) times daily as needed for flatulence.     . simvastatin (ZOCOR) 20 MG tablet Take 20 mg by mouth daily at 10 pm.     . VICTOZA 18 MG/3ML SOPN Inject 1.8 mg into the skin daily.  6  . HYDROcodone-acetaminophen (NORCO) 7.5-325 MG tablet Take 1 tablet by mouth 2 (two) times daily as needed (for pain.).     Marland Kitchen metoprolol tartrate (LOPRESSOR) 25 MG tablet Take 0.5 tablets (12.5 mg total) by mouth 2 (two) times daily. (Patient not taking: Reported on 02/05/2018) 30 tablet 6   No current facility-administered medications for this encounter.     Allergies  Allergen Reactions  . Metformin And Related Nausea And Vomiting  . Prednisone Nausea And Vomiting    Social History   Socioeconomic History  .  Marital status: Divorced    Spouse name: Not on file  . Number of children: Not on file  . Years of education: Not on file  . Highest education level: Not on file  Occupational History  . Not on file  Social Needs  . Financial resource strain: Not on file  . Food insecurity:    Worry: Not on file    Inability: Not on file  . Transportation needs:    Medical: Not on file    Non-medical: Not on file  Tobacco Use  . Smoking status: Never Smoker  . Smokeless tobacco: Never Used  Substance and Sexual Activity  . Alcohol use: No  . Drug use: No  . Sexual activity: Not on file  Lifestyle  . Physical activity:    Days per week: Not on file     Minutes per session: Not on file  . Stress: Not on file  Relationships  . Social connections:    Talks on phone: Not on file    Gets together: Not on file    Attends religious service: Not on file    Active member of club or organization: Not on file    Attends meetings of clubs or organizations: Not on file    Relationship status: Not on file  . Intimate partner violence:    Fear of current or ex partner: Not on file    Emotionally abused: Not on file    Physically abused: Not on file    Forced sexual activity: Not on file  Other Topics Concern  . Not on file  Social History Narrative  . Not on file    No family history on file. The patient does not have a history of early familial atrial fibrillation or other arrhythmias.  ROS- All systems are reviewed and negative except as per the HPI above.  Physical Exam: Vitals:   02/06/18 1546  BP: (!) 124/56  Pulse: (!) 111  Weight: 196 lb (88.9 kg)  Height: 5\' 3"  (1.6 m)    GEN- The patient is overweight appearing, alert and oriented x 3 today.  Walks slowly with a cane Head- normocephalic, atraumatic Eyes-  Sclera clear, conjunctiva pink Ears- hearing intact Oropharynx- clear Neck- supple  Lungs- Clear to ausculation bilaterally, normal work of breathing Heart- irregular rate and rhythm, no murmurs, rubs or gallops  GI- soft, NT, ND, + BS Extremities- no clubbing, cyanosis, or edema MS- R leg in a boot Skin- no rash or lesion Psych- euthymic mood, full affect Neuro- strength and sensation are intact  Wt Readings from Last 3 Encounters:  02/06/18 196 lb (88.9 kg)  01/17/18 203 lb (92.1 kg)  01/10/18 203 lb (92.1 kg)    EKG today demonstrates afib, V rate 110 bpm Echo 04/07/2016 demonstrated preserved EF, mild LA enlargement  Epic records are reviewed at length today  Assessment and Plan:  1. Permanent atrial fibrillation The patient has asymptomatic permanent atrial fibrillation.  The patients CHAD2VASC score  is 4.  she is  appropriately anticoagulated at this time. The patient has elevated V rate today.  She attributes this to her foot injury/ difficulty walking and is not willing to increase medicine at this time.  She is not taking the metoprolol that Butch Penny had previously advised. Antiarrhythmic therapy to dates has included none.  A long discussion with the patient was had today regarding therapeutic strategies.  Extensive discussion of lifestyle modification including weight loss was advisd.  Presently, our recommendations include no  changes today   2. Morbid obesity As above, lifestyle modification was discussed at length including regular exercise and weight reduction.  Body mass index is 34.72 kg/m.  3. preop She is planning to have R foot surgery in 2 weeks.  OK to proceed from CV standpoint.  Would hold eliquis 48 hours prior to the procedure and resume when able post operatively   Return to see Butch Penny in 4 months  Thompson Grayer, MD 02/06/2018 4:17 PM

## 2018-02-11 ENCOUNTER — Telehealth (INDEPENDENT_AMBULATORY_CARE_PROVIDER_SITE_OTHER): Payer: Self-pay | Admitting: Orthopedic Surgery

## 2018-02-11 NOTE — Telephone Encounter (Signed)
Please see message below and advise.

## 2018-02-11 NOTE — Telephone Encounter (Signed)
Ronit I have not called to tell the pt yet. I am just giving a heads up to you so you can cancel the surgery and I will notify her in the morning.

## 2018-02-11 NOTE — Telephone Encounter (Signed)
We will have to reschedule surgery.  She will need to get her anemia worked up prior to proceeding with the arthroscopic surgery.

## 2018-02-11 NOTE — Progress Notes (Signed)
PCP: [name], MD  Cardiologist:  EKG: 02/06/18 in [facility]  Stress test:  ECHO: 04/07/16 in [facility]  Cardiac Cath:  Chest x-ray

## 2018-02-11 NOTE — Pre-Procedure Instructions (Signed)
Meshawn Oconnor Haig  02/11/2018      [id number] [place], [place] [address] Phone: [phone] Fax: [facility] [place], [place] [address] Phone: [phone] Fax: [phone]    Your procedure is scheduled on April 5 , 2019.  Report to [facility] Admitting at [place] AM.  Call this number if you have problems the morning of surgery:  [phone]   Remember:  Do not eat food or drink liquids after midnight.  Take these medicines the morning of surgery with A SIP OF WATER metoprolol (lopressor), oxybutynin (ditropan), sertraline (zoloft),  diltiazem (cardizem), hydrocodone-acetaminophen (norco)-if needed for pain.   WHAT DO I DO ABOUT MY DIABETES MEDICATION?  Marland Kitchen Do not take oral diabetes medicines (pills) the morning of surgery.  . THE MORNING OF SURGERY or the night before (whenever you normally take your dose of levimir, take 5 units units of levemir insulin, 1/2 of your normal dose.  The day of surgery, do not take other diabetes injectables, including Byetta (exenatide), Bydureon (exenatide ER), Victoza (liraglutide), or Trulicity (dulaglutide). Follow your doctor's instructions on when to stop/resume Eliquis.  7 days prior to surgery STOP taking any Aspirin (unless otherwise instructed by your surgeon), Aleve, Naproxen, Ibuprofen, Motrin, Advil, Goody's, BC's, all herbal medications, fish oil, and all vitamins  Continue all other medications as instructed by your physician except follow the above medication instructions before surgery   How to Manage Your Diabetes Before and After Surgery  Why is it important to control my blood sugar before and after surgery? . Improving blood sugar levels before and after surgery helps healing and can limit problems. . A way of improving blood sugar control is eating a  healthy diet by: o  Eating less sugar and carbohydrates o  Increasing activity/exercise o  Talking with your doctor about reaching your blood sugar goals . High blood sugars (greater than 180 mg/dL) can raise your risk of infections and slow your recovery, so you will need to focus on controlling your diabetes during the weeks before surgery. . Make sure that the doctor who takes care of your diabetes knows about your planned surgery including the date and location.  How do I manage my blood sugar before surgery? . Check your blood sugar at least 4 times a day, starting 2 days before surgery, to make sure that the level is not too high or low. o Check your blood sugar the morning of your surgery when you wake up and every 2 hours until you get to the Short Stay unit. . If your blood sugar is less than 70 mg/dL, you will need to treat for low blood sugar: o Do not take insulin. o Treat a low blood sugar (less than 70 mg/dL) with  cup of clear juice (cranberry or apple), 4 glucose tablets, OR glucose gel. Recheck blood sugar in 15 minutes after treatment (to make sure it is greater than 70 mg/dL). If your blood sugar is not greater than 70 mg/dL on recheck, call [phone] o  for further instructions. . Report your blood sugar to the short stay nurse when you get to Short Stay.  . If you are admitted to the hospital after surgery: o Your blood sugar will be checked by the staff and you will probably be given insulin after surgery (instead of oral diabetes medicines)  to make sure you have good blood sugar levels. o The goal for blood sugar control after surgery is 80-180 mg/dL  Reviewed and Endorsed by [facility] Patient Education Committee, August 2015   Do not wear jewelry, make-up or nail polish.  Do not wear lotions, powders, or perfumes, or deodorant.  Do not shave 48 hours prior to surgery.    Do not bring valuables to the hospital.  [facility] is not responsible for any belongings  or valuables.  Contacts, dentures or bridgework may not be worn into surgery.  Leave your suitcase in the car.  After surgery it may be brought to your room.  For patients admitted to the hospital, discharge time will be determined by your treatment team.  Patients discharged the day of surgery will not be allowed to drive home.    - Preparing For Surgery  Before surgery, you can play an important role. Because skin is not sterile, your skin needs to be as free of germs as possible. You can reduce the number of germs on your skin by washing with CHG (chlorahexidine gluconate) Soap before surgery.  CHG is an antiseptic cleaner which kills germs and bonds with the skin to continue killing germs even after washing.  Please do not use if you have an allergy to CHG or antibacterial soaps. If your skin becomes reddened/irritated stop using the CHG.  Do not shave (including legs and underarms) for at least 48 hours prior to first CHG shower. It is OK to shave your face.  Please follow these instructions carefully.   1. Shower the NIGHT BEFORE SURGERY and the MORNING OF SURGERY with CHG.   2. If you chose to wash your hair, wash your hair first as usual with your normal shampoo.  3. After you shampoo, rinse your hair and body thoroughly to remove the shampoo.  4. Use CHG as you would any other liquid soap. You can apply CHG directly to the skin and wash gently with a scrungie or a clean washcloth.   5. Apply the CHG Soap to your body ONLY FROM THE NECK DOWN.  Do not use on open wounds or open sores. Avoid contact with your eyes, ears, mouth and genitals (private parts). Wash Face and genitals (private parts)  with your normal soap.  6. Wash thoroughly, paying special attention to the area where your surgery will be performed.  7. Thoroughly rinse your body with warm water from the neck down.  8. DO NOT shower/wash with your normal soap after using and rinsing off the CHG  Soap.  9. Pat yourself dry with a CLEAN TOWEL.  10. Wear CLEAN PAJAMAS to bed the night before surgery, wear comfortable clothes the morning of surgery  11. Place CLEAN SHEETS on your bed the night of your first shower and DO NOT SLEEP WITH PETS.  Day of Surgery: Do not apply any deodorants/lotions. Please wear clean clothes to the hospital/surgery center.    Please read over the following fact sheets that you were given. Pain Booklet, Coughing and Deep Breathing and Surgical Site Infection Prevention

## 2018-02-11 NOTE — Telephone Encounter (Signed)
Patient went to see her PCP-Sharon Wolters on Wednesday.  Patient states her hemoglobin was 7.8 and would like to know if she is still on for the R-ankle arthroscopy. Dr. Stephanie Acre will be referring patient to a hematologist after the surgery.  She also has questions regarding recovery and returning to work.   Please call patient at home.

## 2018-02-12 ENCOUNTER — Telehealth (INDEPENDENT_AMBULATORY_CARE_PROVIDER_SITE_OTHER): Payer: Self-pay

## 2018-02-12 ENCOUNTER — Inpatient Hospital Stay (HOSPITAL_COMMUNITY)
Admission: RE | Admit: 2018-02-12 | Discharge: 2018-02-12 | Disposition: A | Discharge status: Home / Self Care | Payer: Self-pay | Source: Ambulatory Visit

## 2018-02-12 NOTE — Telephone Encounter (Signed)
-----   Message from Pamella Pert, [place] sent at 02/11/2018  5:36 PM EDT ----- Regarding: cx surgery Pt will have to cx surgery for this Friday pending anemia work up

## 2018-02-12 NOTE — Telephone Encounter (Signed)
Called pt and lm on vm to advise that surgery for Friday will be postponed pending anemia eval. To call with questions.

## 2018-02-13 ENCOUNTER — Telehealth: Payer: Self-pay | Admitting: Hematology and Oncology

## 2018-02-13 ENCOUNTER — Encounter: Payer: Self-pay | Admitting: Hematology and Oncology

## 2018-02-13 NOTE — Telephone Encounter (Signed)
Appt has been scheduled for the pt to see Dr. Lebron Conners on 4/19 at 120pm. Pt aware to arrive 30 minutes early. Letter mailed.

## 2018-02-15 ENCOUNTER — Encounter (HOSPITAL_COMMUNITY): Admission: RE | Payer: Self-pay | Source: Ambulatory Visit

## 2018-02-15 ENCOUNTER — Ambulatory Visit (HOSPITAL_COMMUNITY): Admission: RE | Admit: 2018-02-15 | Payer: Medicare HMO | Source: Ambulatory Visit | Admitting: Orthopedic Surgery

## 2018-02-15 SURGERY — ARTHROSCOPY, ANKLE
Anesthesia: General | Laterality: Right

## 2018-02-28 ENCOUNTER — Inpatient Hospital Stay (INDEPENDENT_AMBULATORY_CARE_PROVIDER_SITE_OTHER): Payer: Self-pay | Admitting: Orthopedic Surgery

## 2018-02-28 ENCOUNTER — Telehealth: Payer: Self-pay | Admitting: Hematology and Oncology

## 2018-02-28 NOTE — Telephone Encounter (Signed)
Patient called to reschedule appt for 4/19 due to sickness

## 2018-03-01 ENCOUNTER — Encounter: Payer: Self-pay | Admitting: Hematology and Oncology

## 2018-03-04 ENCOUNTER — Inpatient Hospital Stay (INDEPENDENT_AMBULATORY_CARE_PROVIDER_SITE_OTHER): Payer: Self-pay | Admitting: Orthopedic Surgery

## 2018-03-04 ENCOUNTER — Other Ambulatory Visit (HOSPITAL_COMMUNITY): Payer: Self-pay | Admitting: Nurse Practitioner

## 2018-03-29 ENCOUNTER — Encounter: Payer: Self-pay | Admitting: Hematology and Oncology

## 2018-03-29 ENCOUNTER — Inpatient Hospital Stay: Payer: Medicare HMO

## 2018-03-29 ENCOUNTER — Telehealth: Payer: Self-pay

## 2018-03-29 ENCOUNTER — Inpatient Hospital Stay: Payer: Medicare HMO | Attending: Hematology and Oncology | Admitting: Hematology and Oncology

## 2018-03-29 VITALS — BP 121/76 | HR 93 | Temp 98.5°F | Resp 18 | Ht 63.0 in

## 2018-03-29 DIAGNOSIS — I4891 Unspecified atrial fibrillation: Secondary | ICD-10-CM | POA: Diagnosis not present

## 2018-03-29 DIAGNOSIS — Z794 Long term (current) use of insulin: Secondary | ICD-10-CM | POA: Insufficient documentation

## 2018-03-29 DIAGNOSIS — Z7901 Long term (current) use of anticoagulants: Secondary | ICD-10-CM | POA: Diagnosis not present

## 2018-03-29 DIAGNOSIS — D509 Iron deficiency anemia, unspecified: Secondary | ICD-10-CM | POA: Diagnosis not present

## 2018-03-29 DIAGNOSIS — Z79899 Other long term (current) drug therapy: Secondary | ICD-10-CM | POA: Insufficient documentation

## 2018-03-29 DIAGNOSIS — E119 Type 2 diabetes mellitus without complications: Secondary | ICD-10-CM

## 2018-03-29 DIAGNOSIS — D500 Iron deficiency anemia secondary to blood loss (chronic): Secondary | ICD-10-CM

## 2018-03-29 LAB — CMP (CANCER CENTER ONLY)
ALBUMIN: 3.3 g/dL — AB (ref 3.5–5.0)
ALT: 11 U/L (ref 0–55)
AST: 9 U/L (ref 5–34)
Alkaline Phosphatase: 60 U/L (ref 40–150)
Anion gap: 7 (ref 3–11)
BUN: 21 mg/dL (ref 7–26)
CO2: 24 mmol/L (ref 22–29)
Calcium: 9.1 mg/dL (ref 8.4–10.4)
Chloride: 111 mmol/L — ABNORMAL HIGH (ref 98–109)
Creatinine: 0.91 mg/dL (ref 0.60–1.10)
GFR, Est AFR Am: >60 mL/min (ref >60)
GFR, Estimated: >60 mL/min (ref >60)
Glucose, Bld: 176 mg/dL — ABNORMAL HIGH (ref 70–140)
POTASSIUM: 4.2 mmol/L (ref 3.5–5.1)
Sodium: 142 mmol/L (ref 136–145)
TOTAL PROTEIN: 6.2 g/dL — AB (ref 6.4–8.3)
Total Bilirubin: 0.3 mg/dL (ref 0.2–1.2)

## 2018-03-29 LAB — IRON AND TIBC
IRON: 18 ug/dL — AB (ref 41–142)
SATURATION RATIOS: 5 % — AB (ref 21–57)
TIBC: 339 ug/dL (ref 236–444)
UIBC: 321 ug/dL

## 2018-03-29 LAB — CBC WITH DIFFERENTIAL (CANCER CENTER ONLY)
BASOS ABS: 0 10*3/uL (ref 0.0–0.1)
Basophils Relative: 1 %
Eosinophils Absolute: 0.1 10*3/uL (ref 0.0–0.5)
Eosinophils Relative: 2 %
HEMATOCRIT: 30.2 % — AB (ref 34.8–46.6)
HEMOGLOBIN: 8.7 g/dL — AB (ref 11.6–15.9)
LYMPHS PCT: 10 %
Lymphs Abs: 0.6 10*3/uL — ABNORMAL LOW (ref 0.9–3.3)
MCH: 25.7 pg (ref 25.1–34.0)
MCHC: 28.8 g/dL — ABNORMAL LOW (ref 31.5–36.0)
MCV: 89.3 fL (ref 79.5–101.0)
MONO ABS: 0.5 10*3/uL (ref 0.1–0.9)
MONOS PCT: 8 %
NEUTROS ABS: 5 10*3/uL (ref 1.5–6.5)
NEUTROS PCT: 79 %
Platelet Count: 251 10*3/uL (ref 145–400)
RBC: 3.38 MIL/uL — ABNORMAL LOW (ref 3.70–5.45)
RDW: 14.4 % (ref 11.2–14.5)
WBC Count: 6.3 10*3/uL (ref 3.9–10.3)

## 2018-03-29 LAB — RETICULOCYTES
RBC.: 3.38 MIL/uL — ABNORMAL LOW (ref 3.70–5.45)
Retic Count, Absolute: 50.7 10*3/uL (ref 33.7–90.7)
Retic Ct Pct: 1.5 % (ref 0.7–2.1)

## 2018-03-29 LAB — FOLATE: FOLATE: 22.2 ng/mL (ref >5.9)

## 2018-03-29 LAB — VITAMIN B12: Vitamin B-12: 335 pg/mL (ref 180–914)

## 2018-03-29 LAB — FERRITIN: Ferritin: 7 ng/mL — ABNORMAL LOW (ref 9–269)

## 2018-03-29 LAB — LACTATE DEHYDROGENASE: LDH: 123 U/L — AB (ref 125–245)

## 2018-03-29 LAB — TECHNOLOGIST SMEAR REVIEW

## 2018-03-29 LAB — TSH: TSH: 1.068 u[IU]/mL (ref 0.308–3.960)

## 2018-03-29 NOTE — Telephone Encounter (Signed)
Printed avs and calender of upcoming appointment. Per 5/17 los 

## 2018-04-02 LAB — METHYLMALONIC ACID, SERUM: METHYLMALONIC ACID, QUANTITATIVE: 344 nmol/L (ref 0–378)

## 2018-04-04 ENCOUNTER — Other Ambulatory Visit: Payer: Self-pay | Admitting: Hematology and Oncology

## 2018-04-04 DIAGNOSIS — D509 Iron deficiency anemia, unspecified: Secondary | ICD-10-CM

## 2018-04-11 NOTE — Progress Notes (Signed)
[facility] New Visit:  Assessment: Iron deficiency anemia 72 y.o. female with progressive normocytic normochromic anemia anemia currently reaching severe status.  Concurrent drop in the ferritin value over the past year suggestive of at least partial contribution of iron depletion to the progressive anemia.  The lack of microcytosis and hypochromia is hinting of possible contribution of other anemia etiologies which need to be further evaluated.  Plan: - Labs today as outlined below. -Fecal occult blood test. -Return to clinic in 1 week with possible parenteral iron supplementation considering that patient has been taking oral iron without significant benefit.   Voice recognition software was used and creation of this note. Despite my best effort at editing the text, some misspelling/errors may have occurred. Orders Placed This Encounter  Procedures  . CBC with Differential (Cancer Center Only)    Standing Status:   Future    Number of Occurrences:   1    Standing Expiration Date:   03/30/2019  . Reticulocytes    Standing Status:   Future    Number of Occurrences:   1    Standing Expiration Date:   03/30/2019  . Technologist smear review    Standing Status:   Future    Number of Occurrences:   1    Standing Expiration Date:   03/30/2019  . CMP ([facility] [facility] only)    Standing Status:   Future    Number of Occurrences:   1    Standing Expiration Date:   03/30/2019  . Iron and TIBC    Standing Status:   Future    Number of Occurrences:   1    Standing Expiration Date:   03/30/2019  . Ferritin    Standing Status:   Future    Number of Occurrences:   1    Standing Expiration Date:   03/30/2019  . TSH    Standing Status:   Future    Number of Occurrences:   1    Standing Expiration Date:   03/30/2019  . Vitamin B12    Standing Status:   Future    Number of Occurrences:   1    Standing Expiration Date:   03/29/2019  . Folate, Serum    Standing Status:   Future     Number of Occurrences:   1    Standing Expiration Date:   03/29/2019  . Methylmalonic acid, serum    Standing Status:   Future    Number of Occurrences:   1    Standing Expiration Date:   03/29/2019  . Lactate dehydrogenase (LDH)    Standing Status:   Future    Number of Occurrences:   1    Standing Expiration Date:   03/29/2019    All questions were answered.  . The patient knows to call the clinic with any problems, questions or concerns.  This note was electronically signed.    History of Presenting Illness Cassandra Allen 72 y.o. presenting to the [facility] for evaluation of iron deficiency anemia, referred by Dr [name].  Patient's past medical history significant for atrial fibrillation, depression, diabetes mellitus type 2.  Patient is currently on chronic anticoagulation with apixaban and has been taking ferrous sulfate 325 mg twice daily for a number of years with reportedly good compliance.  This time, patient denies fevers, chills, night sweats.  Denies significant change in activity tolerance or appetite.  Patient denies epistaxis, gum bleeding, hemoptysis, hematemesis, melena, or  hematochezia.  No dysuria or hematuria.  Oncological/hematological History: --Labs, 10/27/16: Hgb 12.2, MCV 89.4, MCH 29.4, MCHC 32.9, RDW 15.7, Plt 202; Ferritin 23 --Labs, 02/02/17: Hgb 10.6, MCV 90.4, MCH 28.7, MCHC 31.7, RDW 15.1, Plt 216; Ferritin 31 --Labs, 06/08/17: Hgb 10.4, MCV 89.7, MCH 28.3, MCHC 31.6, RDW 16.4, Plt 235; --Labs, 02/14/18: Hgb   8.1, MCV 83.6, MCH 24.4, MCHC 29.2, RDW 19.4, Plt 256 --Labs, 02/08/18: Hgb   7.8, MCV 89.1, MCH 26.5, MCHC 29.8, RDW 17.1, Plt 252; Ferritin 11  Medical History: Past Medical History:  Diagnosis Date  . Arthritis   . Diabetes mellitus without complication ([place])   . Hypertension     Surgical History: No past surgical history on file.  Family History: No family history on file.  Social History: Social History    Socioeconomic History  . Marital status: Divorced    Spouse name: Not on file  . Number of children: Not on file  . Years of education: Not on file  . Highest education level: Not on file  Occupational History  . Not on file  Social Needs  . Financial resource strain: Not on file  . Food insecurity:    Worry: Not on file    Inability: Not on file  . Transportation needs:    Medical: Not on file    Non-medical: Not on file  Tobacco Use  . Smoking status: Never Smoker  . Smokeless tobacco: Never Used  Substance and Sexual Activity  . Alcohol use: No  . Drug use: No  . Sexual activity: Not on file  Lifestyle  . Physical activity:    Days per week: Not on file    Minutes per session: Not on file  . Stress: Not on file  Relationships  . Social connections:    Talks on phone: Not on file    Gets together: Not on file    Attends religious service: Not on file    Active member of club or organization: Not on file    Attends meetings of clubs or organizations: Not on file    Relationship status: Not on file  . Intimate partner violence:    Fear of current or ex partner: Not on file    Emotionally abused: Not on file    Physically abused: Not on file    Forced sexual activity: Not on file  Other Topics Concern  . Not on file  Social History Narrative  . Not on file    Allergies: Allergies  Allergen Reactions  . Metformin And Related Nausea And Vomiting  . Prednisone Nausea And Vomiting    Medications:  Current Outpatient Medications  Medication Sig Dispense Refill  . beta carotene w/minerals (OCUVITE) tablet Take 1 tablet by mouth daily at 12 noon.    . Cholecalciferol (VITAMIN D) 2000 units tablet Take 2,000 Units by mouth 2 (two) times daily.     Marland Kitchen diltiazem (CARDIZEM CD) 120 MG 24 hr capsule Take 1 capsule (120 mg total) by mouth 2 (two) times daily. 180 capsule 3  . ELIQUIS 5 MG TABS tablet TAKE 1 TABLET (5 MG TOTAL) BY MOUTH 2 (TWO) TIMES DAILY. 60 tablet 4   . ferrous sulfate 325 (65 FE) MG EC tablet Take 325 mg by mouth 2 (two) times daily.    Marland Kitchen HYDROcodone-acetaminophen (NORCO) 7.5-325 MG tablet Take 1 tablet by mouth 2 (two) times daily as needed (for pain.).     . Insulin Detemir (LEVEMIR FLEXTOUCH) 100 UNIT/ML Pen  Inject 10 Units into the skin daily.    Marland Kitchen losartan-hydrochlorothiazide (HYZAAR) 100-12.5 MG tablet Take 1 tablet by mouth daily.    . metoprolol tartrate (LOPRESSOR) 25 MG tablet Take 0.5 tablets (12.5 mg total) by mouth 2 (two) times daily. (Patient not taking: Reported on 02/05/2018) 30 tablet 6  . Multiple Vitamin (MULTIVITAMIN WITH MINERALS) TABS tablet Take 1 tablet by mouth daily at 12 noon.     Marland Kitchen oxybutynin (DITROPAN) 5 MG tablet Take 20 mg by mouth daily.    Marland Kitchen oxybutynin (OXYTROL) 3.9 MG/24HR Place 1 patch onto the skin 2 (two) times a week.    . sertraline (ZOLOFT) 100 MG tablet Take 100 mg by mouth daily at 12 noon.     . simethicone (MYLICON) 80 MG chewable tablet Chew 80-160 mg by mouth 2 (two) times daily as needed for flatulence.     . simvastatin (ZOCOR) 20 MG tablet Take 20 mg by mouth daily at 10 pm.     . VICTOZA 18 MG/3ML SOPN Inject 1.8 mg into the skin daily.  6   No current facility-administered medications for this visit.     Review of Systems: Review of Systems  All other systems reviewed and are negative.    PHYSICAL EXAMINATION Blood pressure 121/76, pulse 93, temperature 98.5 F (36.9 C), temperature source Oral, resp. rate 18, height 5' 3"  (1.6 m), SpO2 97 %.  ECOG PERFORMANCE STATUS: 0 - Asymptomatic  Physical Exam  Constitutional: She is oriented to person, place, and time. She appears well-developed and well-nourished. No distress.  HENT:  Head: Normocephalic and atraumatic.  Mouth/Throat: Oropharynx is clear and moist. No oropharyngeal exudate.  Eyes: Pupils are equal, round, and reactive to light. Conjunctivae and EOM are normal. No scleral icterus.  Neck: No thyromegaly present.   Cardiovascular: Normal heart sounds and intact distal pulses. Exam reveals no gallop and no friction rub.  No murmur heard. Irregularly irregular heartbeat  Pulmonary/Chest: Effort normal and breath sounds normal. No stridor. No respiratory distress. She has no wheezes. She has no rales.  Abdominal: Soft. Bowel sounds are normal. She exhibits no distension and no mass. There is no tenderness. There is no guarding.  Musculoskeletal: She exhibits no edema.  Lymphadenopathy:    She has no cervical adenopathy.  Neurological: She is alert and oriented to person, place, and time. She displays normal reflexes. No cranial nerve deficit or sensory deficit.  Skin: Skin is dry. No rash noted. She is not diaphoretic. No erythema. There is pallor.     LABORATORY DATA: I have personally reviewed the data as listed: Appointment on 03/29/2018  Component Date Value Ref Range Status  . WBC Count 03/29/2018 6.3  3.9 - 10.3 K/uL Final  . RBC 03/29/2018 3.38* 3.70 - 5.45 MIL/uL Final  . Hemoglobin 03/29/2018 8.7* 11.6 - 15.9 g/dL Final  . HCT 03/29/2018 30.2* 34.8 - 46.6 % Final  . MCV 03/29/2018 89.3  79.5 - 101.0 fL Final  . MCH 03/29/2018 25.7  25.1 - 34.0 pg Final  . MCHC 03/29/2018 28.8* 31.5 - 36.0 g/dL Final  . RDW 03/29/2018 14.4  11.2 - 14.5 % Final  . Platelet Count 03/29/2018 251  145 - 400 K/uL Final  . Neutrophils Relative % 03/29/2018 79  % Final  . Neutro Abs 03/29/2018 5.0  1.5 - 6.5 K/uL Final  . Lymphocytes Relative 03/29/2018 10  % Final  . Lymphs Abs 03/29/2018 0.6* 0.9 - 3.3 K/uL Final  . Monocytes Relative 03/29/2018  8  % Final  . Monocytes Absolute 03/29/2018 0.5  0.1 - 0.9 K/uL Final  . Eosinophils Relative 03/29/2018 2  % Final  . Eosinophils Absolute 03/29/2018 0.1  0.0 - 0.5 K/uL Final  . Basophils Relative 03/29/2018 1  % Final  . Basophils Absolute 03/29/2018 0.0  0.0 - 0.1 K/uL Final   Performed at [facility], [place] [address] [place],  [place] [address]  . Retic Ct Pct 03/29/2018 1.5  0.7 - 2.1 % Final  . RBC. 03/29/2018 3.38* 3.70 - 5.45 MIL/uL Final  . Retic Count, Absolute 03/29/2018 50.7  33.7 - 90.7 K/uL Final   Performed at [facility], [place] [address] [place], [place] [address]  . Sodium 03/29/2018 142  136 - 145 mmol/L Final  . Potassium 03/29/2018 4.2  3.5 - 5.1 mmol/L Final  . Chloride 03/29/2018 111* 98 - 109 mmol/L Final  . CO2 03/29/2018 24  22 - 29 mmol/L Final  . Glucose, Bld 03/29/2018 176* 70 - 140 mg/dL Final  . BUN 03/29/2018 21  7 - 26 mg/dL Final  . Creatinine 03/29/2018 0.91  0.60 - 1.10 mg/dL Final  . Calcium 03/29/2018 9.1  8.4 - 10.4 mg/dL Final  . Total Protein 03/29/2018 6.2* 6.4 - 8.3 g/dL Final  . Albumin 03/29/2018 3.3* 3.5 - 5.0 g/dL Final  . AST 03/29/2018 9  5 - 34 U/L Final  . ALT 03/29/2018 11  0 - 55 U/L Final  . Alkaline Phosphatase 03/29/2018 60  40 - 150 U/L Final  . Total Bilirubin 03/29/2018 0.3  0.2 - 1.2 mg/dL Final  . GFR, Est Non Af Am 03/29/2018 >60  >60 mL/min Final  . GFR, Est AFR Am 03/29/2018 >60  >60 mL/min Final   Comment: (NOTE) The eGFR has been calculated using the CKD EPI equation. This calculation has not been validated in all clinical situations. eGFR's persistently <60 mL/min signify possible Chronic Kidney Disease.   Georgiann Hahn gap 03/29/2018 7  3 - 11 Final   Performed at [facility], [place] [address], [place], [place] [address]  . Iron 03/29/2018 18* 41 - 142 ug/dL Final  . TIBC 03/29/2018 339  236 - 444 ug/dL Final  . Saturation Ratios 03/29/2018 5* 21 - 57 % Final  . UIBC 03/29/2018 321  ug/dL Final   Performed at [facility], [place] [address], [place], [place] [address]  . Ferritin 03/29/2018 7* 9 - 269 ng/mL Final   Performed at [facility], [place] [address], [place], [place] [address]  . TSH 03/29/2018 1.068  0.308 - 3.960 uIU/mL Final   Performed at [facility], [place] [address] [place], [place] [address]  . Vitamin B-12 03/29/2018 335  180 - 914 pg/mL Final   Comment: (NOTE) This assay is not validated for testing neonatal or myeloproliferative syndrome specimens for Vitamin B12 levels. Performed at [facility], [place] [address], [place], [place] [address]   . Folate 03/29/2018 22.2  >5.9 ng/mL Final   Performed at [facility], [place] [address] [place], [place] [address]  . Methylmalonic Acid, Quantitative 03/29/2018 344  0 - 378 nmol/L Final  . Disclaimer: 03/29/2018 Comment   Final   Comment: (NOTE) This test was developed and its performance characteristics determined by [facility]. It has not been cleared or approved by the Food and Drug Administration. Performed At: [facility]  [place], [place] [id number] Rush Farmer MD [id number] Performed at [facility], [place] [address], [place], [place] [address]   . LDH 03/29/2018 123* 125 - 245 U/L Final   Performed at [facility], [place] [address], [place], [place] [address]  . Tech Review 03/29/2018 few ovalocytes seen   Final   Performed at [facility], [place] [address], [place],  [address]         Ardath Sax, MD

## 2018-04-11 NOTE — Assessment & Plan Note (Signed)
72 y.o. female with progressive normocytic normochromic anemia anemia currently reaching severe status.  Concurrent drop in the ferritin value over the past year suggestive of at least partial contribution of iron depletion to the progressive anemia.  The lack of microcytosis and hypochromia is hinting of possible contribution of other anemia etiologies which need to be further evaluated.  Plan: - Labs today as outlined below. -Fecal occult blood test. -Return to clinic in 1 week with possible parenteral iron supplementation considering that patient has been taking oral iron without significant benefit.

## 2018-04-12 ENCOUNTER — Inpatient Hospital Stay: Payer: Medicare HMO | Admitting: Hematology and Oncology

## 2018-04-12 ENCOUNTER — Inpatient Hospital Stay: Payer: Medicare HMO

## 2018-04-12 ENCOUNTER — Telehealth: Payer: Self-pay | Admitting: Hematology and Oncology

## 2018-04-12 NOTE — Telephone Encounter (Signed)
Rescheduled appt per 5/31 sch msg - spoke w/ pt re appts.

## 2018-04-17 ENCOUNTER — Other Ambulatory Visit: Payer: Self-pay | Admitting: Pharmacist

## 2018-04-17 NOTE — Patient Outreach (Signed)
[place] [facility]) Care Management  04/17/2018  Micaila Ziemba [facility] 20-Jan-1946 [id number]   Incoming call from Eugenio Saenz in response to the [facility] Medication Adherence Campaign. Speak with patient. HIPAA identifiers verified and verbal consent received.  Ms. Riccardi reports that she has been taking her Levemir daily as directed and her Victoza daily as directed when she has samples from her doctor's office. Reports that she has had difficulty with affording both of these medications, as well as her Eliquis, since she entered the coverage gap of her insurance coverage. Reports that her Cardiologist has been able to supply her with plenty of samples of the Eliquis to keep her from running out. Counsel patient on the importance of medication adherence.  Ms. Kellison reports that her income is too high for her to qualify for extra help through Social Security. Review income requirements of the Eliquis patient assistance program through [place] patient assistance program through [employer] with the patient and she confirms that she meets these requirements. Patient also reports that she believes that she is close/has met the out of pocket requirements. Discuss with patient the out of pocket documentation that is needed for these patient assistance applications. Ms. Wehrli states that she would appreciate our help with completing the applications.  Ms. Sheperd denies any further medication questions/concerns at this time. Provide patient with my phone number.   PLAN  1) Will send an InBasket message to [place] to request her assistance with helping the patient apply for patient assistance for Levemir, Victoza and Eliquis.  2) Will continue to follow with Caryl Pina as she assists the patient with these applications.  Harlow Asa, PharmD, [place] [facility] [phone]

## 2018-04-18 ENCOUNTER — Other Ambulatory Visit: Payer: Self-pay | Admitting: Pharmacy Technician

## 2018-04-18 NOTE — Patient Outreach (Signed)
[place] [facility]) Care Management  04/18/2018  Cassandra Allen [facility] [birth year] [id number]   Received Novo Nordisk and [place] patient assistance referral from [place] for Levemir, Victoza and Eliquis. Prepared patient portion of applications to be mailed out. Faxed provider portions of apps as following: [employer] ([place]) to Dr. Stephanie Acre and Roosvelt Harps (Eliquis) to [place], NP.  Will follow up with patient in 5-7 business days to confirm applications have been received.  [name] [place], [place] [facility] [phone]

## 2018-04-23 ENCOUNTER — Telehealth: Payer: Self-pay | Admitting: Hematology

## 2018-04-23 NOTE — Telephone Encounter (Signed)
Rescheduled to a date YF isn't in [facility] - left vm for pt re appts.

## 2018-04-24 ENCOUNTER — Ambulatory Visit: Payer: Medicare HMO

## 2018-04-24 ENCOUNTER — Ambulatory Visit: Payer: Medicare HMO | Admitting: Nurse Practitioner

## 2018-05-01 ENCOUNTER — Inpatient Hospital Stay: Payer: Medicare HMO

## 2018-05-01 ENCOUNTER — Inpatient Hospital Stay: Payer: Medicare HMO | Attending: Hematology and Oncology | Admitting: Nurse Practitioner

## 2018-05-01 ENCOUNTER — Encounter: Payer: Self-pay | Admitting: Nurse Practitioner

## 2018-05-01 ENCOUNTER — Other Ambulatory Visit: Payer: Self-pay | Admitting: Nurse Practitioner

## 2018-05-01 VITALS — BP 145/61 | HR 95 | Temp 98.7°F | Resp 18 | Ht 63.0 in | Wt 193.7 lb

## 2018-05-01 VITALS — BP 123/58 | HR 73 | Temp 98.6°F | Resp 18

## 2018-05-01 DIAGNOSIS — Z794 Long term (current) use of insulin: Secondary | ICD-10-CM | POA: Insufficient documentation

## 2018-05-01 DIAGNOSIS — R5383 Other fatigue: Secondary | ICD-10-CM | POA: Insufficient documentation

## 2018-05-01 DIAGNOSIS — I4891 Unspecified atrial fibrillation: Secondary | ICD-10-CM | POA: Insufficient documentation

## 2018-05-01 DIAGNOSIS — Z7901 Long term (current) use of anticoagulants: Secondary | ICD-10-CM | POA: Insufficient documentation

## 2018-05-01 DIAGNOSIS — I10 Essential (primary) hypertension: Secondary | ICD-10-CM | POA: Insufficient documentation

## 2018-05-01 DIAGNOSIS — R0602 Shortness of breath: Secondary | ICD-10-CM | POA: Diagnosis not present

## 2018-05-01 DIAGNOSIS — M549 Dorsalgia, unspecified: Secondary | ICD-10-CM | POA: Diagnosis not present

## 2018-05-01 DIAGNOSIS — R634 Abnormal weight loss: Secondary | ICD-10-CM | POA: Diagnosis not present

## 2018-05-01 DIAGNOSIS — E119 Type 2 diabetes mellitus without complications: Secondary | ICD-10-CM | POA: Diagnosis not present

## 2018-05-01 DIAGNOSIS — D509 Iron deficiency anemia, unspecified: Secondary | ICD-10-CM | POA: Diagnosis not present

## 2018-05-01 DIAGNOSIS — R110 Nausea: Secondary | ICD-10-CM | POA: Insufficient documentation

## 2018-05-01 LAB — CBC WITH DIFFERENTIAL (CANCER CENTER ONLY)
BASOS ABS: 0 10*3/uL (ref 0.0–0.1)
Basophils Relative: 1 %
EOS PCT: 1 %
Eosinophils Absolute: 0.1 10*3/uL (ref 0.0–0.5)
HCT: 26.4 % — ABNORMAL LOW (ref 34.8–46.6)
Hemoglobin: 7.4 g/dL — ABNORMAL LOW (ref 11.6–15.9)
LYMPHS PCT: 9 %
Lymphs Abs: 0.7 10*3/uL — ABNORMAL LOW (ref 0.9–3.3)
MCH: 25.5 pg (ref 25.1–34.0)
MCHC: 28 g/dL — ABNORMAL LOW (ref 31.5–36.0)
MCV: 91 fL (ref 79.5–101.0)
Monocytes Absolute: 0.6 10*3/uL (ref 0.1–0.9)
Monocytes Relative: 8 %
Neutro Abs: 6 10*3/uL (ref 1.5–6.5)
Neutrophils Relative %: 81 %
PLATELETS: 242 10*3/uL (ref 145–400)
RBC: 2.9 MIL/uL — AB (ref 3.70–5.45)
RDW: 17.9 % — ABNORMAL HIGH (ref 11.2–14.5)
WBC: 7.4 10*3/uL (ref 3.9–10.3)

## 2018-05-01 LAB — IRON AND TIBC
IRON: 16 ug/dL — AB (ref 41–142)
SATURATION RATIOS: 5 % — AB (ref 21–57)
TIBC: 323 ug/dL (ref 236–444)
UIBC: 307 ug/dL

## 2018-05-01 LAB — CMP (CANCER CENTER ONLY)
ALT: <6 U/L (ref 0–55)
ANION GAP: 7 (ref 3–11)
AST: 7 U/L (ref 5–34)
Albumin: 3.1 g/dL — ABNORMAL LOW (ref 3.5–5.0)
Alkaline Phosphatase: 54 U/L (ref 40–150)
BILIRUBIN TOTAL: 0.2 mg/dL (ref 0.2–1.2)
BUN: 26 mg/dL (ref 7–26)
CO2: 24 mmol/L (ref 22–29)
Calcium: 8.6 mg/dL (ref 8.4–10.4)
Chloride: 111 mmol/L — ABNORMAL HIGH (ref 98–109)
Creatinine: 0.99 mg/dL (ref 0.60–1.10)
GFR, EST NON AFRICAN AMERICAN: 56 mL/min — AB (ref >60)
Glucose, Bld: 235 mg/dL — ABNORMAL HIGH (ref 70–140)
POTASSIUM: 4.4 mmol/L (ref 3.5–5.1)
Sodium: 142 mmol/L (ref 136–145)
TOTAL PROTEIN: 5.5 g/dL — AB (ref 6.4–8.3)

## 2018-05-01 LAB — FERRITIN: FERRITIN: 13 ng/mL (ref 9–269)

## 2018-05-01 MED ORDER — SODIUM CHLORIDE 0.9 % IV SOLN
510.0000 mg | Freq: Once | INTRAVENOUS | Status: AC
  Administered 2018-05-01: 510 mg via INTRAVENOUS
  Filled 2018-05-01: qty 17

## 2018-05-01 MED ORDER — SODIUM CHLORIDE 0.9 % IV SOLN
Freq: Once | INTRAVENOUS | Status: AC
  Administered 2018-05-01: 16:00:00 via INTRAVENOUS

## 2018-05-01 NOTE — Patient Instructions (Signed)

## 2018-05-01 NOTE — Progress Notes (Addendum)
[place]  Telephone:(336) [phone] [phone]) [phone]  Clinic Follow up Note   Patient Care Team: [name], MD as PCP - General (Family Medicine) [place], Virl Diamond, [facility] as [place] Management (Pharmacist) Adaline Sill, CPhT as [place] Management (Pharmacy Technician) 05/01/2018  HISTORY OF PRESENT ILLNESS  Cassandra Allen was initially seen by Dr. Lebron Conners, she was referred to hematology for anemia.  Historical records were reviewed which indicate mild anemia dating back to 2016 with hemoglobin ranging from 8.3-11.4. She has declined work up of her anemia in the past.  Iron studies on 03/29/2018 checked by Dr. Lebron Conners indicates serum iron of 18, TIBC 339, 5% saturation ratio, and ferritin of 7.  B12, MMA TSH, and folate were normal at that time.  Dr. Lebron Conners previously recommended fecal occult blood test but she lost the supplies.  She has never had EGD or colonoscopy.  She denies heavy NSAID use; she is on Eliquis x2 years for Afib.  She denies pica, eats a variety of diet including raw meat.  She has never donated received blood products.  He denies bleeding such as epistaxis, hematochezia, or hematuria.  She denies chest pain, dyspnea, syncope, or palpitation.  She has good appetite, denies night sweats, or fever. Has 10 lbs unintentional weight loss in 3 months. She is mildly fatigued at the end of her workday.  Has occasional gas pain, no nausea, vomiting, constipation, or diarrhea.  Takes Norco per PCP for chronic ankle, knee, and back pain.  Has been on oral iron therapy inconsistently "for years;" he takes 2/day and tolerates well.  Past medical history is significant for A. fib, DM, arthritis, and hypertension.  Denies personal or family history of cancer or blood disorder.  She lives alone, is independent and drives herself.  Works full-time at [employer] from 4-12 shift. She is not up to date on cancer screenings, no previous  mammogram or colonoscopy. Her PCP does breast exams.   REVIEW OF SYSTEMS:   Constitutional: Denies fevers, chills (+) fatigue after work day (+) 10 pounds unintentional weight loss in 3 months  Eyes: Denies blurriness of vision Ears, nose, mouth, throat, and face: Denies mucositis, sore throat, or epistaxis Respiratory: Denies cough, dyspnea or wheezes Cardiovascular: Denies palpitation, chest discomfort or lower extremity swelling Gastrointestinal:  Denies nausea, vomiting, constipation, diarrhea, hematochezia, hematemesis, heartburn or change in bowel habits (+) intermittent gas pain Skin: Denies abnormal skin rashes Lymphatics: Denies new lymphadenopathy or easy bruising Neurological:Denies numbness, tingling or new weaknesses Behavioral/Psych: Mood is stable, no new changes  MSK: (+) Chronic ankle, knee, back pain All other systems were reviewed with the patient and are negative.  MEDICAL HISTORY:  Past Medical History:  Diagnosis Date  . Arthritis   . Diabetes mellitus without complication ([place])   . Hypertension     SURGICAL HISTORY: History reviewed. No pertinent surgical history.  I have reviewed the social history and family history with the patient and they are unchanged from previous note.  ALLERGIES:  is allergic to metformin and related and prednisone.  MEDICATIONS:  Current Outpatient Medications  Medication Sig Dispense Refill  . beta carotene w/minerals (OCUVITE) tablet Take 1 tablet by mouth daily at 12 noon.    . Cholecalciferol (VITAMIN D) 2000 units tablet Take 2,000 Units by mouth 2 (two) times daily.     Marland Kitchen diltiazem (CARDIZEM CD) 120 MG 24 hr capsule Take 1 capsule (120 mg total) by mouth 2 (two) times daily.  180 capsule 3  . ELIQUIS 5 MG TABS tablet TAKE 1 TABLET (5 MG TOTAL) BY MOUTH 2 (TWO) TIMES DAILY. 60 tablet 4  . ferrous sulfate 325 (65 FE) MG EC tablet Take 325 mg by mouth 2 (two) times daily.    Marland Kitchen HYDROcodone-acetaminophen (NORCO) 7.5-325 MG  tablet Take 1 tablet by mouth 2 (two) times daily as needed (for pain.).     . Insulin Detemir (LEVEMIR FLEXTOUCH) 100 UNIT/ML Pen Inject 10 Units into the skin daily.    Marland Kitchen losartan-hydrochlorothiazide (HYZAAR) 100-12.5 MG tablet Take 1 tablet by mouth daily.    . Multiple Vitamin (MULTIVITAMIN WITH MINERALS) TABS tablet Take 1 tablet by mouth daily at 12 noon.     Marland Kitchen oxybutynin (DITROPAN) 5 MG tablet Take 20 mg by mouth daily.    Marland Kitchen oxybutynin (OXYTROL) 3.9 MG/24HR Place 1 patch onto the skin 2 (two) times a week.    . sertraline (ZOLOFT) 100 MG tablet Take 100 mg by mouth daily at 12 noon.     . simethicone (MYLICON) 80 MG chewable tablet Chew 80-160 mg by mouth 2 (two) times daily as needed for flatulence.     . simvastatin (ZOCOR) 20 MG tablet Take 20 mg by mouth daily at 10 pm.      No current facility-administered medications for this visit.    Facility-Administered Medications Ordered in Other Visits  Medication Dose Route Frequency Provider Last Rate Last Dose  . ferumoxytol (FERAHEME) 510 mg in sodium chloride 0.9 % 100 mL IVPB  510 mg Intravenous Once Ardath Sax, MD 468 mL/hr at 05/01/18 1617 510 mg at 05/01/18 1617    PHYSICAL EXAMINATION: ECOG PERFORMANCE STATUS: 1 - Symptomatic but completely ambulatory  Vitals:   05/01/18 1425  BP: (!) 145/61  Pulse: 95  Resp: 18  Temp: 98.7 F (37.1 C)   Filed Weights   05/01/18 1425  Weight: 193 lb 11.2 oz (87.9 kg)    GENERAL:alert, no distress and comfortable SKIN: no rashes or significant lesions EYES: normal, Conjunctiva are pink and non-injected, sclera clear OROPHARYNX: No thrush or ulcers LYMPH:  no palpable cervical, supraclavicular, axillary, or inguinal lymphadenopathy LUNGS: clear to auscultation with normal breathing effort HEART: (+) Afib, no murmurs and no lower extremity edema ABDOMEN:abdomen soft, non-tender and normal bowel sounds (+) obese Musculoskeletal:no cyanosis of digits and no clubbing  NEURO:  alert & oriented x 3 with fluent speech  LABORATORY DATA:  I have reviewed the data as listed CBC Latest Ref Rng & Units 05/01/2018 03/29/2018 08/02/2016  WBC 3.9 - 10.3 K/uL 7.4 6.3 7.5  Hemoglobin 11.6 - 15.9 g/dL 7.4(L) 8.7(L) 9.8(L)  Hematocrit 34.8 - 46.6 % 26.4(L) 30.2(L) 31.3(L)  Platelets 145 - 400 K/uL 242 251 251     CMP Latest Ref Rng & Units 05/01/2018 03/29/2018 03/27/2016  Glucose 70 - 140 mg/dL 235(H) 176(H) 163(H)  BUN 7 - 26 mg/dL 26 21 37(H)  Creatinine 0.60 - 1.10 mg/dL 0.99 0.91 1.14(H)  Sodium 136 - 145 mmol/L 142 142 141  Potassium 3.5 - 5.1 mmol/L 4.4 4.2 4.5  Chloride 98 - 109 mmol/L 111(H) 111(H) 108  CO2 22 - 29 mmol/L 24 24 21(L)  Calcium 8.4 - 10.4 mg/dL 8.6 9.1 9.4  Total Protein 6.4 - 8.3 g/dL 5.5(L) 6.2(L) -  Total Bilirubin 0.2 - 1.2 mg/dL 0.2 0.3 -  Alkaline Phos 40 - 150 U/L 54 60 -  AST 5 - 34 U/L 7 9 -  ALT 0 -  55 U/L <6 11 -      RADIOGRAPHIC STUDIES: I have personally reviewed the radiological images as listed and agreed with the findings in the report. No results found.   ASSESSMENT & PLAN: 71 year old caucasian female with Afib, DM, HTN, and arthritis profound iron deficiency anemia of unknown etiology  1. Iron deficiency anemia -She has profound anemia, Hgb 7.4, iron studies are consistent with iron deficiency. This issue has not been worked up despite multiple recommendations to do so. The patient is not interested in any invasive tests. Dr. Burr Medico reviewed various causes of iron deficiency anemia including malignancy, the patient understands. She is on eliquis, which could contribute to her condition -She has been on intermittent iron therapy for years, most recently on 2 per day for the last month. Her anemia has worsened, from 8.7 to 7.4 over the last month despite being on iron therapy, iron studies have not responded much.  -Dr. Burr Medico recommends IV Feraheme to quickly improve her condition. Will arrange IV Feraheme weekly x2, will get first  dose today. We reviewed possible side effects including anaphylaxis; she agrees to proceed.  -Will check labs when she returns for 2nd IV Feraheme in 1 week, then check labs monthly x3 and f/u in 2-3 months -In the meantime Dr. Burr Medico strongly urged her to undergo colonoscopy, patient declined. She also declined GI referral to discuss the procedure or possible other exploratory studies. She does agree to stool card, although Dr. Burr Medico reviewed this is not the most sensitive test. Supplies provided to her today.   2. HTN, Afib, DM -Managed by Dr. Stephanie Acre, PCP -BG 235 today, on insulin; I reviewed her blood sugar is not well controlled.   3. Health and Wellness  -She has not had routine cancer screenings, including colonoscopy or mammogram. She is worried her insurance won't cover mammogram. She agrees to get one if insurance cover, I ordered today and asked the [employer] verify her insurance before scheduling.  -She Burr Medico strongly recommends colonoscopy, given her unintentional weight loss and profound anemia at her age, the risk of malignancy is certainly there. The patient declined today.  PLAN: -Labs and medical records reviewed -Continue oral iron BID -IV Feraheme weekly x2, 1st dose today -Lab, Feraheme next week -Labs monthly x3 -F/u in 2-3 months  -Mammogram ordered -Patient will consider GI referral and colonoscopy   Orders Placed This Encounter  Procedures  . MM Digital Screening    Standing Status:   Future    Standing Expiration Date:   05/01/2019    Scheduling Instructions:     Please verify insurance when calling patient    Order Specific Question:   Reason for Exam (SYMPTOM  OR DIAGNOSIS REQUIRED)    Answer:   initial screening mammogram    Order Specific Question:   Preferred imaging location?    Answer:   [facility]  . CMP ([place] only)    Standing Status:   Standing    Number of Occurrences:   50    Standing Expiration Date:   05/02/2019   All  questions were answered. The patient knows to call the clinic with any problems, questions or concerns. No barriers to learning was detected.     Alla Feeling, NP 05/01/18    Addendum  I have seen the patient, examined her. I agree with the assessment and and plan and have edited the notes.   Cassandra Allen has clear lab evidence of iron deficiency, has  not responded to.  Her anemia is worse than before today.  Will start on IV Feraheme infusion today, the second dose next week.  We will follow-up with her in CBC monthly, to ensure it improves. If her anemia dose not resolved after adequate IV iron replacement, I will consider additional work-up.  We discussed the possible etiology of her iron deficiency. She has been on Eliquis which can cause mild occlude GI bleeding. Patient has had worsening anemia, iron deficiency and weight loss, I strongly recommended colonoscopy to rule out malignancy.  However patient is very reluctant, declined GI referral today, but she will think about it.   Will see her back in 3 months.  Truitt Merle  05/01/2018

## 2018-05-02 ENCOUNTER — Telehealth: Payer: Self-pay | Admitting: Hematology

## 2018-05-02 NOTE — Telephone Encounter (Signed)
Appointments scheduled Letter/calendar mailed to patient per 6/19 los/  Spoke with patient also regarding appts scheduled

## 2018-05-08 ENCOUNTER — Other Ambulatory Visit: Payer: Self-pay | Admitting: Pharmacy Technician

## 2018-05-08 NOTE — Patient Outreach (Signed)
[place] [facility]) Care Management  05/08/2018  Cassandra Allen [facility] 1946/08/02 [id number]   Unsuccessful outreach attempt #1 in regards to patient assistance applications, HIPAA appropriate voicemail left.  Will make outreach attempt #2 in 2-3 business days.  [name] [place], [place] Management [phone]

## 2018-05-09 ENCOUNTER — Other Ambulatory Visit: Payer: Self-pay | Admitting: Pharmacy Technician

## 2018-05-09 NOTE — Patient Outreach (Signed)
[place] [facility]) Care Management  05/09/2018  Vaunda Gutterman [facility] May 15, 1946 [id number]   Incoming return call from Ms. Holbein, HIPAA identifiers verified. Patient stated that she has received applications and that she is collecting required documents but states she will put in the mail soon. Ms. Arrick states that she thinks she might make to much money for [employer] ([employer]liquis) and not have spent enough for [employer] ([place]). I told her we should still try just in case.  Will follow up with patient in 14-21 business days if applications have not been received back.  [name] [place], [place] Management [phone]

## 2018-05-10 ENCOUNTER — Inpatient Hospital Stay (HOSPITAL_BASED_OUTPATIENT_CLINIC_OR_DEPARTMENT_OTHER): Payer: Medicare HMO | Admitting: Medical

## 2018-05-10 ENCOUNTER — Telehealth: Payer: Self-pay | Admitting: Medical

## 2018-05-10 ENCOUNTER — Other Ambulatory Visit: Payer: Self-pay

## 2018-05-10 ENCOUNTER — Inpatient Hospital Stay: Payer: Medicare HMO

## 2018-05-10 ENCOUNTER — Emergency Department (HOSPITAL_COMMUNITY): Payer: Medicare HMO

## 2018-05-10 ENCOUNTER — Encounter (HOSPITAL_COMMUNITY): Payer: Self-pay | Admitting: Emergency Medicine

## 2018-05-10 ENCOUNTER — Emergency Department (HOSPITAL_COMMUNITY)
Admission: EM | Admit: 2018-05-10 | Discharge: 2018-05-10 | Disposition: A | Discharge status: Home / Self Care | Payer: Medicare HMO | Attending: Emergency Medicine | Admitting: Emergency Medicine

## 2018-05-10 VITALS — BP 131/64 | HR 122 | Temp 98.4°F | Resp 24

## 2018-05-10 DIAGNOSIS — D509 Iron deficiency anemia, unspecified: Secondary | ICD-10-CM

## 2018-05-10 DIAGNOSIS — I10 Essential (primary) hypertension: Secondary | ICD-10-CM | POA: Diagnosis not present

## 2018-05-10 DIAGNOSIS — Z7901 Long term (current) use of anticoagulants: Secondary | ICD-10-CM | POA: Insufficient documentation

## 2018-05-10 DIAGNOSIS — E119 Type 2 diabetes mellitus without complications: Secondary | ICD-10-CM | POA: Insufficient documentation

## 2018-05-10 DIAGNOSIS — Z794 Long term (current) use of insulin: Secondary | ICD-10-CM | POA: Diagnosis not present

## 2018-05-10 DIAGNOSIS — R110 Nausea: Secondary | ICD-10-CM | POA: Diagnosis not present

## 2018-05-10 DIAGNOSIS — R634 Abnormal weight loss: Secondary | ICD-10-CM | POA: Diagnosis not present

## 2018-05-10 DIAGNOSIS — M549 Dorsalgia, unspecified: Secondary | ICD-10-CM | POA: Diagnosis not present

## 2018-05-10 DIAGNOSIS — R5383 Other fatigue: Secondary | ICD-10-CM | POA: Diagnosis not present

## 2018-05-10 DIAGNOSIS — Z79899 Other long term (current) drug therapy: Secondary | ICD-10-CM | POA: Insufficient documentation

## 2018-05-10 DIAGNOSIS — T50905A Adverse effect of unspecified drugs, medicaments and biological substances, initial encounter: Secondary | ICD-10-CM | POA: Diagnosis not present

## 2018-05-10 DIAGNOSIS — T8090XA Unspecified complication following infusion and therapeutic injection, initial encounter: Secondary | ICD-10-CM

## 2018-05-10 DIAGNOSIS — T7840XA Allergy, unspecified, initial encounter: Secondary | ICD-10-CM | POA: Diagnosis not present

## 2018-05-10 DIAGNOSIS — R079 Chest pain, unspecified: Secondary | ICD-10-CM | POA: Diagnosis not present

## 2018-05-10 DIAGNOSIS — R0602 Shortness of breath: Secondary | ICD-10-CM

## 2018-05-10 DIAGNOSIS — R0789 Other chest pain: Secondary | ICD-10-CM | POA: Diagnosis not present

## 2018-05-10 DIAGNOSIS — R531 Weakness: Secondary | ICD-10-CM | POA: Diagnosis not present

## 2018-05-10 DIAGNOSIS — T454X5A Adverse effect of iron and its compounds, initial encounter: Secondary | ICD-10-CM | POA: Diagnosis not present

## 2018-05-10 LAB — CBC WITH DIFFERENTIAL/PLATELET
Basophils Absolute: 0 10*3/uL (ref 0.0–0.1)
Basophils Relative: 0 %
EOS ABS: 0 10*3/uL (ref 0.0–0.7)
EOS PCT: 0 %
HCT: 35.7 % — ABNORMAL LOW (ref 36.0–46.0)
Hemoglobin: 10.1 g/dL — ABNORMAL LOW (ref 12.0–15.0)
LYMPHS ABS: 0.1 10*3/uL — AB (ref 0.7–4.0)
Lymphocytes Relative: 3 %
MCH: 26.9 pg (ref 26.0–34.0)
MCHC: 28.3 g/dL — AB (ref 30.0–36.0)
MCV: 95.2 fL (ref 78.0–100.0)
MONO ABS: 0 10*3/uL — AB (ref 0.1–1.0)
Monocytes Relative: 1 %
Neutro Abs: 4.3 10*3/uL (ref 1.7–7.7)
Neutrophils Relative %: 96 %
PLATELETS: 258 10*3/uL (ref 150–400)
RBC: 3.75 MIL/uL — ABNORMAL LOW (ref 3.87–5.11)
RDW: 19.2 % — AB (ref 11.5–15.5)
WBC: 4.5 10*3/uL (ref 4.0–10.5)

## 2018-05-10 LAB — BASIC METABOLIC PANEL
Anion gap: 5 (ref 5–15)
BUN: 25 mg/dL — AB (ref 8–23)
CO2: 22 mmol/L (ref 22–32)
CREATININE: 0.84 mg/dL (ref 0.44–1.00)
Calcium: 8.1 mg/dL — ABNORMAL LOW (ref 8.9–10.3)
Chloride: 119 mmol/L — ABNORMAL HIGH (ref 98–111)
GFR calc Af Amer: >60 mL/min (ref >60)
GFR calc non Af Amer: >60 mL/min (ref >60)
GLUCOSE: 180 mg/dL — AB (ref 70–99)
POTASSIUM: 4.2 mmol/L (ref 3.5–5.1)
Sodium: 146 mmol/L — ABNORMAL HIGH (ref 135–145)

## 2018-05-10 LAB — I-STAT TROPONIN, ED: Troponin i, poc: 0 ng/mL (ref 0.00–0.08)

## 2018-05-10 LAB — CBG MONITORING, ED: GLUCOSE-CAPILLARY: 163 mg/dL — AB (ref 70–99)

## 2018-05-10 MED ORDER — IPRATROPIUM-ALBUTEROL 0.5-2.5 (3) MG/3ML IN SOLN
3.0000 mL | Freq: Once | RESPIRATORY_TRACT | Status: AC
Start: 2018-05-10 — End: 2018-05-10
  Administered 2018-05-10: 3 mL via RESPIRATORY_TRACT
  Filled 2018-05-10: qty 3

## 2018-05-10 MED ORDER — METHYLPREDNISOLONE SODIUM SUCC 125 MG IJ SOLR
60.0000 mg | Freq: Once | INTRAMUSCULAR | Status: AC
  Administered 2018-05-10: 60 mg via INTRAVENOUS

## 2018-05-10 MED ORDER — SODIUM CHLORIDE 0.9 % IV SOLN
510.0000 mg | Freq: Once | INTRAVENOUS | Status: AC
  Administered 2018-05-10: 510 mg via INTRAVENOUS
  Filled 2018-05-10: qty 17

## 2018-05-10 MED ORDER — DIPHENHYDRAMINE HCL 50 MG/ML IJ SOLN
50.0000 mg | Freq: Once | INTRAMUSCULAR | Status: AC
  Administered 2018-05-10: 50 mg via INTRAVENOUS

## 2018-05-10 MED ORDER — FAMOTIDINE IN NACL 20-0.9 MG/50ML-% IV SOLN
20.0000 mg | Freq: Two times a day (BID) | INTRAVENOUS | Status: DC
  Administered 2018-05-10: 20 mg via INTRAVENOUS

## 2018-05-10 MED ORDER — SODIUM CHLORIDE 0.9 % IV SOLN
Freq: Once | INTRAVENOUS | Status: AC
  Administered 2018-05-10: 15:00:00 via INTRAVENOUS

## 2018-05-10 NOTE — ED Triage Notes (Signed)
Pt from cancer center getting her 2nd iron dose and with back and chest pain, SOB. Received 50mg  benadryl, 20mg  pepcid, 62.5 solumedrol IV about 30 mins ago. Patient lethargic but oriented.

## 2018-05-10 NOTE — Telephone Encounter (Signed)
No LOS 6/28 °

## 2018-05-10 NOTE — Progress Notes (Signed)
   DATE: 05/10/2018      X CHEMO/IMMUNOTHERAPY REACTION           MD: Dr. Truitt Merle  AGENT/BLOOD PRODUCT RECEIVING TODAY:   Feraheme  AGENT/BLOOD PRODUCT RECEIVING IMMEDIATELY PRIOR TO REACTION: Feraheme  VS: BP:     131/64  p:       165 SPO2:       90% on 2 L via nasal cannula         REACTION(S): Shortness of breath, nausea, back pain and mild lethargy.  PREMEDS: None  INTERVENTION:  Benadryl 50 mg IV, Pepcid 20 mg IV, Solu-Medrol 62.5 mg IV.    Review of Systems  Constitutional: Negative for diaphoresis.  HENT: Negative for trouble swallowing.   Respiratory: Positive for shortness of breath. Negative for cough, choking, chest tightness and wheezing.   Cardiovascular: Negative for chest pain and palpitations.  Gastrointestinal: Positive for nausea. Negative for vomiting.  Musculoskeletal: Positive for back pain.  Skin: Negative for color change and rash.  Neurological: Negative for dizziness, light-headedness and headaches.       Lethargy    Physical Exam  Constitutional: No distress.  HENT:  Head: Normocephalic and atraumatic.  Cardiovascular: Normal heart sounds. An irregularly irregular rhythm present. Exam reveals no friction rub.  No murmur heard. Pulmonary/Chest: Effort normal and breath sounds normal. No respiratory distress. She has no wheezes. She has no rales.  Neurological:  The patient appeared lethargic but was oriented.  She responded appropriately to all questions.  Skin: Skin is warm and dry. She is not diaphoretic. There is pallor.     OUTCOME: IV Feraheme was stopped after receiving approximately one quarter of her infusion. The patient was transported to the emergency room for evaluation and management given her wide fluctuations in her pulse with fluctuations to the mid 160s.    Sandi Mealy, MHS, PA-C   This case was discussed with Dr. Burr Medico. She expressed agreement with my management of this patient.

## 2018-05-10 NOTE — Progress Notes (Signed)
1510- Pt calls out reporting SOB, nausea, and back pain.  Iron stopped.  NS hung wide open.  PA Lucianne Lei called.  Placed on 2L [place] oxygen. 1511- Benadryl 50 IV 1513- IVPB Pepcid, VS 131/64, 158 HR, 24 RR, 90% 1517- Pt reports dec SOB, nausea, and back pain, appears to be somewhat lethargic but oriented x4. Pt received iron IV a week ago without problems. Hx afib and DM, HR continues to vary between 90s and 150s. Transported to ED.  Report given to Charge RN [facility]

## 2018-05-10 NOTE — ED Provider Notes (Signed)
[place] DEPT Provider Note   CSN: [id number] Arrival date & time: 05/10/18  1535     History   Chief Complaint Chief Complaint  Patient presents with  . Allergic Reaction    HPI Cassandra Allen is a 72 y.o. female.  HPI Patient presents after shortness of breath and chest pain.  Began acutely while getting an iron infusion.  Was treated with Benadryl Pepcid and Solu-Medrol.  No previous allergic reaction iron infusions.  Still has some chest pain shortness of breath.  History of atrial fibrillation is on Eliquis.  Also has iron deficiency anemia.  Dull chest pain.  States she still feels somewhat bad. Past Medical History:  Diagnosis Date  . Arthritis   . Diabetes mellitus without complication ([place])   . Hypertension     Patient Active Problem List   Diagnosis Date Noted  . Iron deficiency anemia 04/04/2018  . 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 Pain in right ankle and joints of right foot 01/10/2018  . Acute renal failure ([place]) 03/25/2015  . Essential (primary) hypertension 03/25/2015  . Diabetes mellitus without complication ([place]) 19/50/9326  . AKI (acute kidney injury) ([place]) 03/25/2015    History reviewed. No pertinent surgical history.   OB History   None      Home Medications    Prior to Admission medications   Medication Sig Start Date End Date Taking? Authorizing Provider  beta carotene w/minerals (OCUVITE) tablet Take 1 tablet by mouth daily at 12 noon.   Yes [provider]  cholecalciferol (VITAMIN D) 1000 units tablet Take 1,000 Units by mouth at bedtime.   Yes [provider]  diltiazem (CARDIZEM CD) 120 MG 24 hr capsule Take 1 capsule (120 mg total) by mouth 2 (two) times daily. 11/22/17  Yes [name], NP  ELIQUIS 5 MG TABS tablet TAKE 1 TABLET (5 MG TOTAL) BY MOUTH 2 (TWO) TIMES DAILY. 03/05/18  Yes [name], NP  ferrous  sulfate 325 (65 FE) MG EC tablet Take 325 mg by mouth 2 (two) times daily.   Yes [provider]  HYDROcodone-acetaminophen (NORCO) 7.5-325 MG tablet Take 1 tablet by mouth 2 (two) times daily as needed (for pain.).    Yes [provider]  Insulin Detemir (LEVEMIR FLEXTOUCH) 100 UNIT/ML Pen Inject 10 Units into the skin daily.   Yes [provider]  losartan-hydrochlorothiazide (HYZAAR) 100-12.5 MG tablet Take 1 tablet by mouth 2 (two) times daily.    Yes [provider]  Multiple Vitamin (MULTIVITAMIN WITH MINERALS) TABS tablet Take 2 tablets by mouth daily at 12 noon.    Yes [provider]  oxybutynin (DITROPAN) 5 MG tablet Take 20 mg by mouth daily.   Yes [provider]  oxybutynin (OXYTROL) 3.9 MG/24HR Place 1 patch onto the skin 2 (two) times a week.   Yes [provider]  sertraline (ZOLOFT) 100 MG tablet Take 100 mg by mouth daily at 12 noon.    Yes [provider]  simethicone (MYLICON) 80 MG chewable tablet Chew 80-160 mg by mouth 2 (two) times daily as needed for flatulence.  02/23/15  Yes [provider]  simvastatin (ZOCOR) 20 MG tablet Take 20 mg by mouth daily at 10 pm.  02/28/15  Yes [provider]    Family History History reviewed. No pertinent family history.  Social History Social History   Tobacco Use  . Smoking status: Never  Smoker  . Smokeless tobacco: Never Used  Substance Use Topics  . Alcohol use: No  . Drug use: No     Allergies   Metformin and related and Prednisone   Review of Systems Review of Systems  Constitutional: Positive for fatigue. Negative for appetite change.  HENT: Negative for congestion.   Respiratory: Positive for shortness of breath.   Cardiovascular: Positive for chest pain.  Gastrointestinal: Negative for abdominal pain.  Genitourinary: Negative for flank pain.  Musculoskeletal: Negative for back pain.  Skin: Negative for rash.    Neurological: Positive for weakness. Negative for numbness.  Hematological: Negative for adenopathy.  Psychiatric/Behavioral: Negative for confusion.     Physical Exam Updated Vital Signs BP 120/71 (BP Location: Left Arm)   Pulse (!) 121   Temp 98.4 F (36.9 C) (Oral)   Resp 18   SpO2 100%   Physical Exam  Constitutional: She appears well-developed.  HENT:  Head: Normocephalic.  Eyes: Pupils are equal, round, and reactive to light.  Mild conjunctival edema bilaterally.  Neck: Neck supple.  Cardiovascular:  Irregular tachycardia  Pulmonary/Chest: She has wheezes.  Few scattered wheezes.  Musculoskeletal: She exhibits no tenderness.  Neurological: She is alert.  Skin: Capillary refill takes less than 2 seconds.  Psychiatric: She has a normal mood and affect.     ED Treatments / Results  Labs (all labs ordered are listed, but only abnormal results are displayed) Labs Reviewed  CBC WITH DIFFERENTIAL/PLATELET - Abnormal; Notable for the following components:      Result Value   RBC 3.75 (*)    Hemoglobin 10.1 (*)    HCT 35.7 (*)    MCHC 28.3 (*)    RDW 19.2 (*)    Lymphs Abs 0.1 (*)    Monocytes Absolute 0.0 (*)    All other components within normal limits  BASIC METABOLIC PANEL - Abnormal; Notable for the following components:   Sodium 146 (*)    Chloride 119 (*)    Glucose, Bld 180 (*)    BUN 25 (*)    Calcium 8.1 (*)    All other components within normal limits  CBG MONITORING, ED - Abnormal; Notable for the following components:   Glucose-Capillary 163 (*)    All other components within normal limits  I-STAT CHEM 8, ED  I-STAT TROPONIN, ED    EKG None  Radiology Dg Chest 2 View  Result Date: 05/10/2018 CLINICAL DATA:  Chest pain shortness of breath EXAM: CHEST - 2 VIEW COMPARISON:  03/27/2016 FINDINGS: Cardiac shadow is within normal limits. The lungs are well aerated bilaterally. No focal infiltrate or sizable effusion is seen. Degenerative changes  of the thoracic spine are noted. IMPRESSION: No active cardiopulmonary disease. Electronically Signed   By: Inez Catalina M.D.   On: 05/10/2018 17:18    Procedures Procedures (including critical care time)  Medications Ordered in ED Medications  ipratropium-albuterol (DUONEB) 0.5-2.5 (3) MG/3ML nebulizer solution 3 mL (3 mLs Nebulization Given 05/10/18 1723)     Initial Impression / Assessment and Plan / ED Course  I have reviewed the triage vital signs and the nursing notes.  Pertinent labs & imaging results that were available during my care of the patient were reviewed by me and considered in my medical decision making (see chart for details).     Patient with likely reaction to iron infusion.  Has chronic A. fib with mild tachycardia with it.  Feels better after treatment.  Has been monitored for hours  in the ER.  Mild hypernatremia.  Had dose of Solu-Medrol at cancer center.  Does not tolerate prednisone.  Will discharge home.  We will follow-up as needed.  Final Clinical Impressions(s) / ED Diagnoses   Final diagnoses:  Adverse effect of drug, initial encounter    ED Discharge Orders    None       Davonna Belling, MD 05/10/18 2350

## 2018-05-10 NOTE — Patient Instructions (Signed)

## 2018-05-10 NOTE — ED Notes (Signed)
Patient transported to X-ray 

## 2018-05-12 ENCOUNTER — Telehealth: Payer: Self-pay | Admitting: Hematology

## 2018-05-13 ENCOUNTER — Telehealth: Payer: Self-pay | Admitting: Hematology

## 2018-05-13 NOTE — Telephone Encounter (Signed)
Appointments scheduled per 6/30 sch message/patient also requested other appointments to be moved from tuesdays to Fridays or Wednesdays

## 2018-05-31 ENCOUNTER — Inpatient Hospital Stay: Payer: Medicare HMO

## 2018-05-31 ENCOUNTER — Inpatient Hospital Stay: Payer: Medicare HMO | Attending: Hematology and Oncology | Admitting: Nurse Practitioner

## 2018-05-31 ENCOUNTER — Other Ambulatory Visit: Payer: Self-pay

## 2018-05-31 ENCOUNTER — Ambulatory Visit: Payer: Self-pay | Admitting: Nurse Practitioner

## 2018-05-31 ENCOUNTER — Encounter: Payer: Self-pay | Admitting: Nurse Practitioner

## 2018-05-31 VITALS — BP 136/80 | HR 110 | Temp 98.6°F | Resp 18 | Ht 63.0 in | Wt 188.0 lb

## 2018-05-31 DIAGNOSIS — Z794 Long term (current) use of insulin: Secondary | ICD-10-CM | POA: Diagnosis not present

## 2018-05-31 DIAGNOSIS — M1990 Unspecified osteoarthritis, unspecified site: Secondary | ICD-10-CM | POA: Diagnosis not present

## 2018-05-31 DIAGNOSIS — E119 Type 2 diabetes mellitus without complications: Secondary | ICD-10-CM | POA: Insufficient documentation

## 2018-05-31 DIAGNOSIS — I10 Essential (primary) hypertension: Secondary | ICD-10-CM | POA: Diagnosis not present

## 2018-05-31 DIAGNOSIS — Z79899 Other long term (current) drug therapy: Secondary | ICD-10-CM | POA: Diagnosis not present

## 2018-05-31 DIAGNOSIS — K625 Hemorrhage of anus and rectum: Secondary | ICD-10-CM | POA: Diagnosis not present

## 2018-05-31 DIAGNOSIS — D509 Iron deficiency anemia, unspecified: Secondary | ICD-10-CM

## 2018-05-31 DIAGNOSIS — I4891 Unspecified atrial fibrillation: Secondary | ICD-10-CM | POA: Diagnosis not present

## 2018-05-31 LAB — CBC WITH DIFFERENTIAL (CANCER CENTER ONLY)
BASOS ABS: 0.1 10*3/uL (ref 0.0–0.1)
Basophils Relative: 1 %
Eosinophils Absolute: 0.1 10*3/uL (ref 0.0–0.5)
Eosinophils Relative: 2 %
HEMATOCRIT: 24.6 % — AB (ref 34.8–46.6)
Hemoglobin: 7.6 g/dL — ABNORMAL LOW (ref 11.6–15.9)
LYMPHS PCT: 10 %
Lymphs Abs: 0.5 10*3/uL — ABNORMAL LOW (ref 0.9–3.3)
MCH: 27.3 pg (ref 25.1–34.0)
MCHC: 31 g/dL — AB (ref 31.5–36.0)
MCV: 88.2 fL (ref 79.5–101.0)
MONO ABS: 0.6 10*3/uL (ref 0.1–0.9)
MONOS PCT: 11 %
NEUTROS ABS: 4.1 10*3/uL (ref 1.5–6.5)
Neutrophils Relative %: 76 %
Platelet Count: 204 10*3/uL (ref 145–400)
RBC: 2.79 MIL/uL — ABNORMAL LOW (ref 3.70–5.45)
RDW: 18.5 % — AB (ref 11.2–14.5)
WBC Count: 5.3 10*3/uL (ref 3.9–10.3)

## 2018-05-31 LAB — CMP (CANCER CENTER ONLY)
ALK PHOS: 57 U/L (ref 38–126)
ALT: 8 U/L (ref 0–44)
AST: 7 U/L — AB (ref 15–41)
Albumin: 3.1 g/dL — ABNORMAL LOW (ref 3.5–5.0)
Anion gap: 6 (ref 5–15)
BUN: 31 mg/dL — AB (ref 8–23)
CO2: 26 mmol/L (ref 22–32)
CREATININE: 1.04 mg/dL — AB (ref 0.44–1.00)
Calcium: 8.3 mg/dL — ABNORMAL LOW (ref 8.9–10.3)
Chloride: 111 mmol/L (ref 98–111)
GFR, EST NON AFRICAN AMERICAN: 53 mL/min — AB (ref >60)
GFR, Est AFR Am: >60 mL/min (ref >60)
Glucose, Bld: 159 mg/dL — ABNORMAL HIGH (ref 70–99)
Potassium: 4.8 mmol/L (ref 3.5–5.1)
Sodium: 143 mmol/L (ref 135–145)
Total Bilirubin: 0.2 mg/dL — ABNORMAL LOW (ref 0.3–1.2)
Total Protein: 5.3 g/dL — ABNORMAL LOW (ref 6.5–8.1)

## 2018-05-31 LAB — IRON AND TIBC
Iron: 72 ug/dL (ref 41–142)
Saturation Ratios: 29 % (ref 21–57)
TIBC: 246 ug/dL (ref 236–444)
UIBC: 174 ug/dL

## 2018-05-31 LAB — FERRITIN: FERRITIN: 27 ng/mL (ref 11–307)

## 2018-05-31 NOTE — Progress Notes (Signed)
[place]  Telephone:(336) [phone] Fax:(336) [phone]  Clinic Follow up Note   Patient Care Team: [name], MD as PCP - General (Family Medicine) [place], Virl Diamond, [facility] as [place] Management (Pharmacist) Adaline Sill, CPhT as [place] Management (Pharmacy Technician) 05/31/2018  DIAGNOSIS:  Iron deficiency anemia   INTERVAL HISTORY: Cassandra Allen returns for follow up. She was last seen 1 month ago. She underwent IV Feraheme infusion on 6/19 and 6/28. During the second infusion she developed chest pain and dyspnea. She was evaluated by Sandi Mealy, PA and given pepcid, benadryl, and solumedrol. She was tranferred to ED for further evaluation. She remains tired but at baseline. 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 She has diarrhea every morning since taking oral iron therapy, which she takes BID. Stool is dark/black without any obvious blood. Denies recent fever, chills, cough, dyspnea, chest pain, palpitations, headache, or lightheadedness. She has 5 lbs weight loss in 1 month despite normal appetite. After long discussion she does admit to episodic rectal bleeding in stool after eating raw meats, which is about once weekly. She has not seen GI.  REVIEW OF SYSTEMS:   Constitutional: Denies fevers, chills (+) weight loss (+) tired  Eyes: Denies blurriness of vision Ears, nose, mouth, throat, and face: Denies mucositis or sore throat Respiratory: Denies cough, dyspnea or wheezes Cardiovascular: Denies palpitation, chest discomfort or lower extremity swelling (+) Afib  Gastrointestinal:  Denies nausea, heartburn, or change in bowel habits (+) dark stool (+) diarrhea x1 qAM since starting iron therapy (+) hematochezia when eating raw meat, once per week  Skin: Denies abnormal skin rashes Lymphatics: Denies new lymphadenopathy or easy bruising Neurological:Denies numbness, tingling or new  weaknesses Behavioral/Psych: Mood is stable, no new changes  MSK: (+) knee pain  All other systems were reviewed with the patient and are negative.  MEDICAL HISTORY:  Past Medical History:  Diagnosis Date  . Arthritis   . Diabetes mellitus without complication ([place])   . Hypertension     SURGICAL HISTORY: History reviewed. No pertinent surgical history.  I have reviewed the social history and family history with the patient and they are unchanged from previous note.  ALLERGIES:  is allergic to metformin and related and prednisone.  MEDICATIONS:  Current Outpatient Medications  Medication Sig Dispense Refill  . beta carotene w/minerals (OCUVITE) tablet Take 1 tablet by mouth daily at 12 noon.    . cholecalciferol (VITAMIN D) 1000 units tablet Take 1,000 Units by mouth at bedtime.    Marland Kitchen diltiazem (CARDIZEM CD) 120 MG 24 hr capsule Take 1 capsule (120 mg total) by mouth 2 (two) times daily. 180 capsule 3  . ELIQUIS 5 MG TABS tablet TAKE 1 TABLET (5 MG TOTAL) BY MOUTH 2 (TWO) TIMES DAILY. 60 tablet 4  . ferrous sulfate 325 (65 FE) MG EC tablet Take 325 mg by mouth 2 (two) times daily.    Marland Kitchen HYDROcodone-acetaminophen (NORCO) 7.5-325 MG tablet Take 1 tablet by mouth 2 (two) times daily as needed (for pain.).     . Insulin Detemir (LEVEMIR FLEXTOUCH) 100 UNIT/ML Pen Inject 10 Units into the skin daily.    Marland Kitchen losartan-hydrochlorothiazide (HYZAAR) 100-12.5 MG tablet Take 1 tablet by mouth daily.     . Multiple Vitamin (MULTIVITAMIN WITH MINERALS) TABS tablet Take 2 tablets by mouth daily at 12 noon.     Marland Kitchen oxybutynin (DITROPAN) 5 MG tablet Take 20 mg by mouth  daily.    . oxybutynin (OXYTROL) 3.9 MG/24HR Place 1 patch onto the skin 2 (two) times a week.    . sertraline (ZOLOFT) 100 MG tablet Take 100 mg by mouth daily at 12 noon.     . simethicone (MYLICON) 80 MG chewable tablet Chew 80-160 mg by mouth 2 (two) times daily as needed for flatulence.     . simvastatin (ZOCOR) 20 MG tablet Take 20  mg by mouth daily at 10 pm.      No current facility-administered medications for this visit.     PHYSICAL EXAMINATION: ECOG PERFORMANCE STATUS: 0 - Asymptomatic  Vitals:   05/31/18 1407  BP: 136/80  Pulse: (!) 110  Resp: 18  Temp: 98.6 F (37 C)  SpO2: 95%   Filed Weights   05/31/18 1407  Weight: 188 lb (85.3 kg)    GENERAL:alert, no distress and comfortable SKIN: no rashes or significant lesions EYES: normal, Conjunctiva are pink and non-injected, sclera clear OROPHARYNX:no thrush or ulcers LYMPH:  no palpable cervical, supraclavicular, or axillary lymphadenopathy  LUNGS: clear to auscultation with normal breathing effort HEART: regular rate & rhythm and no murmurs and no lower extremity edema ABDOMEN:abdomen soft, non-tender and normal bowel sounds Musculoskeletal:no cyanosis of digits and no clubbing  NEURO: alert & oriented x 3 with fluent speech. Ambulates with altered gait   LABORATORY DATA:  I have reviewed the data as listed CBC Latest Ref Rng & Units 05/31/2018 05/10/2018 05/01/2018  WBC 3.9 - 10.3 K/uL 5.3 4.5 7.4  Hemoglobin 11.6 - 15.9 g/dL 7.6(L) 10.1(L) 7.4(L)  Hematocrit 34.8 - 46.6 % 24.6(L) 35.7(L) 26.4(L)  Platelets 145 - 400 K/uL 204 258 242     CMP Latest Ref Rng & Units 05/31/2018 05/10/2018 05/01/2018  Glucose 70 - 99 mg/dL 159(H) 180(H) 235(H)  BUN 8 - 23 mg/dL 31(H) 25(H) 26  Creatinine 0.44 - 1.00 mg/dL 1.04(H) 0.84 0.99  Sodium 135 - 145 mmol/L 143 146(H) 142  Potassium 3.5 - 5.1 mmol/L 4.8 4.2 4.4  Chloride 98 - 111 mmol/L 111 119(H) 111(H)  CO2 22 - 32 mmol/L 26 22 24   Calcium 8.9 - 10.3 mg/dL 8.3(L) 8.1(L) 8.6  Total Protein 6.5 - 8.1 g/dL 5.3(L) - 5.5(L)  Total Bilirubin 0.3 - 1.2 mg/dL 0.2(L) - 0.2  Alkaline Phos 38 - 126 U/L 57 - 54  AST 15 - 41 U/L 7(L) - 7  ALT 0 - 44 U/L 8 - <6    RADIOGRAPHIC STUDIES: I have personally reviewed the radiological images as listed and agreed with the findings in the report. No results found.    ASSESSMENT & PLAN: 72 year old caucasian female with Afib, DM, HTN, and arthritis profound iron deficiency anemia of unknown etiology  1. Iron deficiency anemia 2. HTN, DM, Afib 3. Health and wellness   Ms. Lanum appears stable. She developed infusion reaction with second IV Feraheme infusion. She was observed in the ED and eventually recovered well. Hgb improved from 7.4 to 10.1 after the first infusion, but abruptly dropped back to 7.6 today. Iron studies are normal now, although ferritin on low end of normal at 27. She is asymptomatic. She finally admits to periodic rectal bleeding, but refuses to see GI for further work up. She agrees to stool card today. She will proceed with IV iron sucrose with test dose and pre-meds, to start in one week, with ferritin goal of 50.   Given her abrupt drop in Hgb and normal iron labs, will obtain  MM panel to rule out bone marrow condition. Will hold bone marrow biopsy for now pending labs and stool test. The plan was reviewed with Dr. Burr Medico. Patient agrees with the plan. Will f/u with labs in 2 weeks, after one dose of Iron sucrose.  PLAN: Labs reviewed Lab, Venofer with test dose and pre-meds in 1 week Lab, f/u with Dr. Burr Medico in 2 weeks    Orders Placed This Encounter  Procedures  . Multiple Myeloma Panel (SPEP&IFE w/QIG)    Standing Status:   Future    Standing Expiration Date:   05/31/2019  . Occult blood card to lab, stool    Standing Status:   Future    Standing Expiration Date:   06/01/2019  . Occult blood card to lab, stool    Standing Status:   Future    Standing Expiration Date:   06/01/2019  . Occult blood card to lab, stool    Standing Status:   Future    Standing Expiration Date:   06/01/2019   All questions were answered. The patient knows to call the clinic with any problems, questions or concerns. No barriers to learning was detected. I spent 20 minutes counseling the patient face to face. The total time spent in the  appointment was 25 minutes and more than 50% was on counseling and review of test results     Cassandra Feeling, NP 05/31/18

## 2018-06-03 ENCOUNTER — Telehealth: Payer: Self-pay

## 2018-06-03 NOTE — Telephone Encounter (Signed)
Printed a calender to mail to patient. Per 7/19 los was unable to move 8/8 appointment due no avalibility . Will mail current calender to patient.

## 2018-06-04 ENCOUNTER — Telehealth: Payer: Self-pay

## 2018-06-04 NOTE — Telephone Encounter (Signed)
Asked Melissa to watch out for sch msg concerning this patient request per 77/23

## 2018-06-04 NOTE — Telephone Encounter (Signed)
Spoke with Cassandra Allen concerning patient schedule for 8/8 and wrote name and note in book on 7/23 regarding patient.per 7/23 follow up on Patient request to move appointment due to she work and don't get home until 1am.

## 2018-06-06 ENCOUNTER — Telehealth: Payer: Self-pay | Admitting: *Deleted

## 2018-06-06 NOTE — Telephone Encounter (Signed)
Patient called stating she needed to cancel her appt for 06/07/2108 as she is having a medical emergency.  Patient did not specific any further.  appt canceled.  Lm for patient to call back to schedule at her earliest convenience.

## 2018-06-07 ENCOUNTER — Other Ambulatory Visit: Payer: Self-pay

## 2018-06-07 ENCOUNTER — Ambulatory Visit: Payer: Self-pay

## 2018-06-10 ENCOUNTER — Telehealth: Payer: Self-pay

## 2018-06-10 NOTE — Telephone Encounter (Signed)
Left a message asking patient to return call concerning her 8/8 appointment. Was unable to move her appointment time due to avalibility. Can move the labs a day earlier if needed. Per 7/29 follow up.

## 2018-06-12 ENCOUNTER — Ambulatory Visit (HOSPITAL_COMMUNITY): Payer: Self-pay | Admitting: Nurse Practitioner

## 2018-06-12 ENCOUNTER — Other Ambulatory Visit: Payer: Self-pay | Admitting: Pharmacy Technician

## 2018-06-12 NOTE — Patient Outreach (Signed)
[facility]) Care Management  06/12/2018  Cassandra Allen [facility] 1945/12/22 [id number]   Received patient portion of applications. Faxed completed application and required documents to [employer] for [employer]. Resent signature portion of Novo Nordisk application to Dr. Stephanie Acre, will fax to company once application is sent back in.  Will follow up with Roosvelt Harps to check application status in 81-66 business days.  [name] [place], [place] Management [phone]

## 2018-06-14 ENCOUNTER — Ambulatory Visit: Payer: Self-pay | Admitting: Hematology

## 2018-06-14 ENCOUNTER — Encounter: Payer: Self-pay | Admitting: Nurse Practitioner

## 2018-06-14 ENCOUNTER — Inpatient Hospital Stay (HOSPITAL_BASED_OUTPATIENT_CLINIC_OR_DEPARTMENT_OTHER): Payer: Medicare HMO | Admitting: Nurse Practitioner

## 2018-06-14 ENCOUNTER — Telehealth: Payer: Self-pay | Admitting: Hematology

## 2018-06-14 ENCOUNTER — Other Ambulatory Visit: Payer: Self-pay | Admitting: *Deleted

## 2018-06-14 ENCOUNTER — Inpatient Hospital Stay: Payer: Medicare HMO

## 2018-06-14 ENCOUNTER — Inpatient Hospital Stay: Payer: Medicare HMO | Attending: Hematology and Oncology

## 2018-06-14 VITALS — BP 119/60 | HR 71 | Temp 98.8°F | Resp 18

## 2018-06-14 VITALS — BP 124/60 | HR 104 | Temp 98.7°F | Resp 18 | Ht 63.0 in | Wt 182.6 lb

## 2018-06-14 DIAGNOSIS — I4891 Unspecified atrial fibrillation: Secondary | ICD-10-CM | POA: Insufficient documentation

## 2018-06-14 DIAGNOSIS — D509 Iron deficiency anemia, unspecified: Secondary | ICD-10-CM

## 2018-06-14 DIAGNOSIS — E119 Type 2 diabetes mellitus without complications: Secondary | ICD-10-CM | POA: Insufficient documentation

## 2018-06-14 DIAGNOSIS — K921 Melena: Secondary | ICD-10-CM | POA: Insufficient documentation

## 2018-06-14 DIAGNOSIS — R195 Other fecal abnormalities: Secondary | ICD-10-CM

## 2018-06-14 DIAGNOSIS — I10 Essential (primary) hypertension: Secondary | ICD-10-CM | POA: Diagnosis not present

## 2018-06-14 LAB — CBC WITH DIFFERENTIAL (CANCER CENTER ONLY)
BASOS ABS: 0.1 10*3/uL (ref 0.0–0.1)
BASOS PCT: 1 %
EOS PCT: 2 %
Eosinophils Absolute: 0.1 10*3/uL (ref 0.0–0.5)
HEMATOCRIT: 24 % — AB (ref 34.8–46.6)
Hemoglobin: 7.5 g/dL — ABNORMAL LOW (ref 11.6–15.9)
LYMPHS PCT: 10 %
Lymphs Abs: 0.6 10*3/uL — ABNORMAL LOW (ref 0.9–3.3)
MCH: 27.6 pg (ref 25.1–34.0)
MCHC: 31.4 g/dL — ABNORMAL LOW (ref 31.5–36.0)
MCV: 88.1 fL (ref 79.5–101.0)
Monocytes Absolute: 0.6 10*3/uL (ref 0.1–0.9)
Monocytes Relative: 10 %
NEUTROS ABS: 5 10*3/uL (ref 1.5–6.5)
Neutrophils Relative %: 77 %
PLATELETS: 211 10*3/uL (ref 145–400)
RBC: 2.72 MIL/uL — AB (ref 3.70–5.45)
RDW: 17.7 % — AB (ref 11.2–14.5)
WBC: 6.4 10*3/uL (ref 3.9–10.3)

## 2018-06-14 LAB — OCCULT BLOOD X 1 CARD TO LAB, STOOL
Fecal Occult Bld: POSITIVE — AB
Fecal Occult Bld: POSITIVE — AB
Fecal Occult Bld: POSITIVE — AB

## 2018-06-14 LAB — IRON AND TIBC
IRON: 48 ug/dL (ref 41–142)
Saturation Ratios: 15 % — ABNORMAL LOW (ref 21–57)
TIBC: 311 ug/dL (ref 236–444)
UIBC: 264 ug/dL

## 2018-06-14 LAB — FERRITIN: Ferritin: 18 ng/mL (ref 11–307)

## 2018-06-14 MED ORDER — METHYLPREDNISOLONE SODIUM SUCC 125 MG IJ SOLR
INTRAMUSCULAR | Status: AC
Start: 2018-06-14 — End: ?
  Filled 2018-06-14: qty 2

## 2018-06-14 MED ORDER — ACETAMINOPHEN 325 MG PO TABS
ORAL_TABLET | ORAL | Status: AC
  Filled 2018-06-14: qty 2

## 2018-06-14 MED ORDER — DIPHENHYDRAMINE HCL 50 MG/ML IJ SOLN
INTRAMUSCULAR | Status: AC
Start: 2018-06-14 — End: ?
  Filled 2018-06-14: qty 1

## 2018-06-14 MED ORDER — METHYLPREDNISOLONE SODIUM SUCC 125 MG IJ SOLR
125.0000 mg | Freq: Once | INTRAMUSCULAR | Status: AC
  Administered 2018-06-14: 125 mg via INTRAVENOUS

## 2018-06-14 MED ORDER — SODIUM CHLORIDE 0.9 % IV SOLN
Freq: Once | INTRAVENOUS | Status: AC
  Administered 2018-06-14: 13:00:00 via INTRAVENOUS
  Filled 2018-06-14: qty 250

## 2018-06-14 MED ORDER — FAMOTIDINE IN NACL 20-0.9 MG/50ML-% IV SOLN
20.0000 mg | Freq: Once | INTRAVENOUS | Status: AC
  Administered 2018-06-14: 20 mg via INTRAVENOUS

## 2018-06-14 MED ORDER — DIPHENHYDRAMINE HCL 50 MG/ML IJ SOLN
50.0000 mg | Freq: Once | INTRAMUSCULAR | Status: AC
  Administered 2018-06-14: 50 mg via INTRAVENOUS

## 2018-06-14 MED ORDER — FAMOTIDINE IN NACL 20-0.9 MG/50ML-% IV SOLN
INTRAVENOUS | Status: AC
  Filled 2018-06-14: qty 50

## 2018-06-14 MED ORDER — ACETAMINOPHEN 325 MG PO TABS
650.0000 mg | ORAL_TABLET | Freq: Once | ORAL | Status: AC
  Administered 2018-06-14: 650 mg via ORAL

## 2018-06-14 MED ORDER — SODIUM CHLORIDE 0.9 % IV SOLN
50.0000 mg | Freq: Once | INTRAVENOUS | Status: AC
  Administered 2018-06-14: 50 mg via INTRAVENOUS
  Filled 2018-06-14: qty 2.5

## 2018-06-14 NOTE — Progress Notes (Signed)
[place]  Telephone:(336) [phone] Fax:(336) [phone]  Clinic Follow up Note   Patient Care Team: [name], MD as PCP - General (Family Medicine) [place], Virl Diamond, [facility] as [place] Management (Pharmacist) Adaline Sill, CPhT as [place] Management (Pharmacy Technician) 06/14/2018  DIAGNOSIS:  Iron deficiency anemia   CURRENT THERAPY: IV feraheme x2 with infusion reaction during 2nd dose, PENDING venofer   INTERVAL HISTORY: Cassandra Allen returns for follow up as scheduled. She canceled last week's appointment to start venofer due to family emergency. She has not received IV iron, but continues oral iron 1-2 times per day. She has loose stool every morning. Takes liquid imodium sometimes. No recent blood in stool. She completed stool card. Appetite is normal but continues to have weight loss. She notes new LLQ intermittent pain that began last week. Pain is not associated with BM. She is more fatigued than usual. Denies cough, chest pain, dyspnea, or palpitations.   REVIEW OF SYSTEMS:   Constitutional: Denies fevers, chills, hot flash, night sweats (+) weight loss (+) fatigue  Eyes: Denies blurriness of vision Ears, nose, mouth, throat, and face: Denies mucositis or sore throat Respiratory: Denies cough, dyspnea or wheezes Cardiovascular: Denies palpitation, chest discomfort or lower extremity swelling Gastrointestinal:  Denies nausea, vomiting, constipation, diarrhea, hematochezia, heartburn or change in bowel habits (+) intermittent LLQ pain x1 week (+) loose BM qAM  Skin: Denies abnormal skin rashes Lymphatics: Denies new lymphadenopathy or easy bruising All other systems were reviewed with the patient and are negative.  MEDICAL HISTORY:  Past Medical History:  Diagnosis Date  . Arthritis   . Diabetes mellitus without complication ([place])   . Hypertension     SURGICAL HISTORY: No past surgical history on  file.  I have reviewed the social history and family history with the patient and they are unchanged from previous note.  ALLERGIES:  is allergic to metformin and related and prednisone.  MEDICATIONS:  Current Outpatient Medications  Medication Sig Dispense Refill  . beta carotene w/minerals (OCUVITE) tablet Take 1 tablet by mouth daily at 12 noon.    . cholecalciferol (VITAMIN D) 1000 units tablet Take 1,000 Units by mouth at bedtime.    Marland Kitchen diltiazem (CARDIZEM CD) 120 MG 24 hr capsule Take 1 capsule (120 mg total) by mouth 2 (two) times daily. 180 capsule 3  . ELIQUIS 5 MG TABS tablet TAKE 1 TABLET (5 MG TOTAL) BY MOUTH 2 (TWO) TIMES DAILY. 60 tablet 4  . ferrous sulfate 325 (65 FE) MG EC tablet Take 325 mg by mouth 2 (two) times daily.    Marland Kitchen HYDROcodone-acetaminophen (NORCO) 7.5-325 MG tablet Take 1 tablet by mouth 2 (two) times daily as needed (for pain.).     . Insulin Detemir (LEVEMIR FLEXTOUCH) 100 UNIT/ML Pen Inject 10 Units into the skin daily.    Marland Kitchen losartan-hydrochlorothiazide (HYZAAR) 100-12.5 MG tablet Take 1 tablet by mouth daily.     . Multiple Vitamin (MULTIVITAMIN WITH MINERALS) TABS tablet Take 2 tablets by mouth daily at 12 noon.     Marland Kitchen oxybutynin (DITROPAN) 5 MG tablet Take 20 mg by mouth daily.    Marland Kitchen oxybutynin (OXYTROL) 3.9 MG/24HR Place 1 patch onto the skin 2 (two) times a week.    . sertraline (ZOLOFT) 100 MG tablet Take 100 mg by mouth daily at 12 noon.     . simethicone (MYLICON) 80 MG chewable tablet Chew 80-160 mg by mouth 2 (two) times daily as  needed for flatulence.     . simvastatin (ZOCOR) 20 MG tablet Take 20 mg by mouth daily at 10 pm.      No current facility-administered medications for this visit.     PHYSICAL EXAMINATION: ECOG PERFORMANCE STATUS: 1 - Symptomatic but completely ambulatory  Vitals:   06/14/18 1103  BP: 124/60  Pulse: (!) 104  Resp: 18  Temp: 98.7 F (37.1 C)  SpO2: 96%   Filed Weights   06/14/18 1103  Weight: 182 lb 9.6 oz (82.8  kg)    GENERAL:alert, no distress and comfortable SKIN: no rashes or significant lesions EYES: sclera clear OROPHARYNX:no thrush or ulcers  LYMPH:  no palpable cervical, supraclavicular, or axillary lymphadenopathy LUNGS: clear to auscultation with normal breathing effort HEART: irregular rhythm, Afib; no lower extremity edema ABDOMEN:abdomen soft, non-tender and normal bowel sounds Musculoskeletal:no cyanosis of digits and no clubbing  NEURO: alert & oriented x 3 with fluent speech  LABORATORY DATA:  I have reviewed the data as listed CBC Latest Ref Rng & Units 06/14/2018 05/31/2018 05/10/2018  WBC 3.9 - 10.3 K/uL 6.4 5.3 4.5  Hemoglobin 11.6 - 15.9 g/dL 7.5(L) 7.6(L) 10.1(L)  Hematocrit 34.8 - 46.6 % 24.0(L) 24.6(L) 35.7(L)  Platelets 145 - 400 K/uL 211 204 258     CMP Latest Ref Rng & Units 05/31/2018 05/10/2018 05/01/2018  Glucose 70 - 99 mg/dL 159(H) 180(H) 235(H)  BUN 8 - 23 mg/dL 31(H) 25(H) 26  Creatinine 0.44 - 1.00 mg/dL 1.04(H) 0.84 0.99  Sodium 135 - 145 mmol/L 143 146(H) 142  Potassium 3.5 - 5.1 mmol/L 4.8 4.2 4.4  Chloride 98 - 111 mmol/L 111 119(H) 111(H)  CO2 22 - 32 mmol/L 26 22 24   Calcium 8.9 - 10.3 mg/dL 8.3(L) 8.1(L) 8.6  Total Protein 6.5 - 8.1 g/dL 5.3(L) - 5.5(L)  Total Bilirubin 0.3 - 1.2 mg/dL 0.2(L) - 0.2  Alkaline Phos 38 - 126 U/L 57 - 54  AST 15 - 41 U/L 7(L) - 7  ALT 0 - 44 U/L 8 - <6      RADIOGRAPHIC STUDIES: I have personally reviewed the radiological images as listed and agreed with the findings in the report. No results found.   ASSESSMENT & PLAN: 72 year old caucasian female with Afib, DM, HTN, and arthritis profound iron deficiency anemia of unknown etiology  1. Iron deficiency anemia 2. HTN, DM, Afib 3. Health and wellness  4. FOBT positive   Cassandra Allen appears stable.  Her anemia is stable from 2 weeks ago, Hgb 7.5 today; iron studies and ferritin are downtrending.  FOBT is positive, she continues to lose weight gradually.  I  reviewed with her that this indicates blood in stool but does not identify the source.  I again strongly encouraged her to consider colonoscopy.  She agrees to GI referral, prefers [facility].  I placed referral today.  Multiple myeloma panel is pending, will follow-up with results.  She will proceed with first dose Venofer with test dose and premeds today and continue weekly.  We will follow-up in 2 weeks.  In the meantime she continues ferrous sulfate twice daily.  Plan: Labs reviewed, MM panel pending Placed urgent referral to GI Proceed with Venofer with premeds today, continue weekly Follow-up in 2 weeks   Orders Placed This Encounter  Procedures  . Ambulatory referral to Gastroenterology    Referral Priority:   Urgent    Referral Type:   Consultation    Referral Reason:   Specialty Services Required  Number of Visits Requested:   1   All questions were answered. The patient knows to call the clinic with any problems, questions or concerns. No barriers to learning was detected. I spent 20 minutes counseling the patient face to face. The total time spent in the appointment was 25 minutes and more than 50% was on counseling and review of test results     Alla Feeling, NP 06/14/18

## 2018-06-14 NOTE — Patient Instructions (Signed)
Iron Sucrose injection What is this medicine? IRON SUCROSE (AHY ern SOO krohs) is an iron complex. Iron is used to make healthy red blood cells, which carry oxygen and nutrients throughout the body. This medicine is used to treat iron deficiency anemia in people with chronic kidney disease. This medicine may be used for other purposes; ask your health care provider or pharmacist if you have questions. COMMON BRAND NAME(S): Venofer What should I tell my health care provider before I take this medicine? They need to know if you have any of these conditions: -anemia not caused by low iron levels -heart disease -high levels of iron in the blood -kidney disease -liver disease -an unusual or allergic reaction to iron, other medicines, foods, dyes, or preservatives -pregnant or trying to get pregnant -breast-feeding How should I use this medicine? This medicine is for infusion into a vein. It is given by a health care professional in a hospital or clinic setting. Talk to your pediatrician regarding the use of this medicine in children. While this drug may be prescribed for children as young as 2 years for selected conditions, precautions do apply. Overdosage: If you think you have taken too much of this medicine contact a poison control center or emergency room at once. NOTE: This medicine is only for you. Do not share this medicine with others. What if I miss a dose? It is important not to miss your dose. Call your doctor or health care professional if you are unable to keep an appointment. What may interact with this medicine? Do not take this medicine with any of the following medications: -deferoxamine -dimercaprol -other iron products This medicine may also interact with the following medications: -chloramphenicol -deferasirox This list may not describe all possible interactions. Give your health care provider a list of all the medicines, herbs, non-prescription drugs, or dietary  supplements you use. Also tell them if you smoke, drink alcohol, or use illegal drugs. Some items may interact with your medicine. What should I watch for while using this medicine? Visit your doctor or healthcare professional regularly. Tell your doctor or healthcare professional if your symptoms do not start to get better or if they get worse. You may need blood work done while you are taking this medicine. You may need to follow a special diet. Talk to your doctor. Foods that contain iron include: whole grains/cereals, dried fruits, beans, or peas, leafy green vegetables, and organ meats (liver, kidney). What side effects may I notice from receiving this medicine? Side effects that you should report to your doctor or health care professional as soon as possible: -allergic reactions like skin rash, itching or hives, swelling of the face, lips, or tongue -breathing problems -changes in blood pressure -cough -fast, irregular heartbeat -feeling faint or lightheaded, falls -fever or chills -flushing, sweating, or hot feelings -joint or muscle aches/pains -seizures -swelling of the ankles or feet -unusually weak or tired Side effects that usually do not require medical attention (report to your doctor or health care professional if they continue or are bothersome): -diarrhea -feeling achy -headache -irritation at site where injected -nausea, vomiting -stomach upset -tiredness This list may not describe all possible side effects. Call your doctor for medical advice about side effects. You may report side effects to FDA at 1-800-FDA-1088. Where should I keep my medicine? This drug is given in a hospital or clinic and will not be stored at home. NOTE: This sheet is a summary. It may not cover all possible information. If   you have questions about this medicine, talk to your doctor, pharmacist, or health care provider.  2018 Elsevier/Gold Standard (2011-08-10 17:14:35)  

## 2018-06-14 NOTE — Telephone Encounter (Signed)
No LOS 8/2

## 2018-06-17 ENCOUNTER — Telehealth: Payer: Self-pay

## 2018-06-17 LAB — MULTIPLE MYELOMA PANEL, SERUM
ALBUMIN/GLOB SERPL: 1.3 (ref 0.7–1.7)
Albumin SerPl Elph-Mcnc: 2.9 g/dL (ref 2.9–4.4)
Alpha 1: 0.3 g/dL (ref 0.0–0.4)
Alpha2 Glob SerPl Elph-Mcnc: 0.8 g/dL (ref 0.4–1.0)
B-GLOBULIN SERPL ELPH-MCNC: 0.9 g/dL (ref 0.7–1.3)
GAMMA GLOB SERPL ELPH-MCNC: 0.4 g/dL (ref 0.4–1.8)
GLOBULIN, TOTAL: 2.4 g/dL (ref 2.2–3.9)
IgA: 103 mg/dL (ref 64–422)
IgG (Immunoglobin G), Serum: 326 mg/dL — ABNORMAL LOW (ref 700–1600)
IgM (Immunoglobulin M), Srm: 66 mg/dL (ref 26–217)
Total Protein ELP: 5.3 g/dL — ABNORMAL LOW (ref 6.0–8.5)

## 2018-06-17 NOTE — Telephone Encounter (Signed)
Patient calls stating that Cassandra Rue NP suggested she have a colonoscopy but she does not want to have it right now.

## 2018-06-17 NOTE — Telephone Encounter (Signed)
OK, thanks. Will discuss further at next f/u.  Regan Rakers

## 2018-06-18 ENCOUNTER — Other Ambulatory Visit (HOSPITAL_COMMUNITY): Payer: Self-pay | Admitting: *Deleted

## 2018-06-18 MED ORDER — APIXABAN 5 MG PO TABS: ORAL_TABLET | ORAL | 0 refills | Status: DC

## 2018-06-20 ENCOUNTER — Other Ambulatory Visit: Payer: Self-pay

## 2018-06-20 ENCOUNTER — Ambulatory Visit: Payer: Self-pay

## 2018-06-21 ENCOUNTER — Inpatient Hospital Stay: Payer: Medicare HMO

## 2018-06-21 ENCOUNTER — Other Ambulatory Visit: Payer: Self-pay

## 2018-06-21 VITALS — BP 116/63 | HR 80 | Temp 98.3°F | Resp 18

## 2018-06-21 DIAGNOSIS — D509 Iron deficiency anemia, unspecified: Secondary | ICD-10-CM

## 2018-06-21 DIAGNOSIS — I4891 Unspecified atrial fibrillation: Secondary | ICD-10-CM | POA: Diagnosis not present

## 2018-06-21 DIAGNOSIS — I10 Essential (primary) hypertension: Secondary | ICD-10-CM | POA: Diagnosis not present

## 2018-06-21 DIAGNOSIS — K921 Melena: Secondary | ICD-10-CM | POA: Diagnosis not present

## 2018-06-21 DIAGNOSIS — E119 Type 2 diabetes mellitus without complications: Secondary | ICD-10-CM | POA: Diagnosis not present

## 2018-06-21 LAB — CBC WITH DIFFERENTIAL (CANCER CENTER ONLY)
BASOS ABS: 0.1 10*3/uL (ref 0.0–0.1)
Basophils Relative: 1 %
Eosinophils Absolute: 0.1 10*3/uL (ref 0.0–0.5)
Eosinophils Relative: 2 %
HEMATOCRIT: 23.6 % — AB (ref 34.8–46.6)
Hemoglobin: 7.1 g/dL — ABNORMAL LOW (ref 11.6–15.9)
LYMPHS PCT: 8 %
Lymphs Abs: 0.5 10*3/uL — ABNORMAL LOW (ref 0.9–3.3)
MCH: 27.5 pg (ref 25.1–34.0)
MCHC: 30.1 g/dL — ABNORMAL LOW (ref 31.5–36.0)
MCV: 91.3 fL (ref 79.5–101.0)
Monocytes Absolute: 0.7 10*3/uL (ref 0.1–0.9)
Monocytes Relative: 11 %
NEUTROS ABS: 5.2 10*3/uL (ref 1.5–6.5)
Neutrophils Relative %: 78 %
Platelet Count: 219 10*3/uL (ref 145–400)
RBC: 2.59 MIL/uL — ABNORMAL LOW (ref 3.70–5.45)
RDW: 17.8 % — ABNORMAL HIGH (ref 11.2–14.5)
WBC: 6.6 10*3/uL (ref 3.9–10.3)

## 2018-06-21 MED ORDER — METHYLPREDNISOLONE SODIUM SUCC 125 MG IJ SOLR
INTRAMUSCULAR | Status: AC
  Filled 2018-06-21: qty 2

## 2018-06-21 MED ORDER — FAMOTIDINE IN NACL 20-0.9 MG/50ML-% IV SOLN
INTRAVENOUS | Status: AC
Start: 2018-06-21 — End: ?
  Filled 2018-06-21: qty 50

## 2018-06-21 MED ORDER — SODIUM CHLORIDE 0.9 % IV SOLN: 150.0000 mg | Freq: Once | INTRAVENOUS | Status: DC

## 2018-06-21 MED ORDER — ACETAMINOPHEN 325 MG PO TABS
650.0000 mg | ORAL_TABLET | Freq: Once | ORAL | Status: AC
  Administered 2018-06-21: 650 mg via ORAL

## 2018-06-21 MED ORDER — SODIUM CHLORIDE 0.9 % IV SOLN
200.0000 mg | Freq: Once | INTRAVENOUS | Status: AC
  Administered 2018-06-21: 200 mg via INTRAVENOUS
  Filled 2018-06-21: qty 10

## 2018-06-21 MED ORDER — SODIUM CHLORIDE 0.9 % IV SOLN
Freq: Once | INTRAVENOUS | Status: AC
  Administered 2018-06-21: 15:00:00 via INTRAVENOUS
  Filled 2018-06-21: qty 250

## 2018-06-21 MED ORDER — FAMOTIDINE IN NACL 20-0.9 MG/50ML-% IV SOLN
20.0000 mg | Freq: Once | INTRAVENOUS | Status: AC
  Administered 2018-06-21: 20 mg via INTRAVENOUS

## 2018-06-21 MED ORDER — ACETAMINOPHEN 325 MG PO TABS
ORAL_TABLET | ORAL | Status: AC
  Filled 2018-06-21: qty 2

## 2018-06-21 MED ORDER — DIPHENHYDRAMINE HCL 50 MG/ML IJ SOLN
INTRAMUSCULAR | Status: AC
  Filled 2018-06-21: qty 1

## 2018-06-21 MED ORDER — METHYLPREDNISOLONE SODIUM SUCC 125 MG IJ SOLR
125.0000 mg | Freq: Once | INTRAMUSCULAR | Status: AC
  Administered 2018-06-21: 125 mg via INTRAVENOUS

## 2018-06-21 MED ORDER — DIPHENHYDRAMINE HCL 50 MG/ML IJ SOLN
50.0000 mg | Freq: Once | INTRAMUSCULAR | Status: AC
  Administered 2018-06-21: 50 mg via INTRAVENOUS

## 2018-06-21 MED ORDER — IRON SUCROSE 20 MG/ML IV SOLN: 50.0000 mg | Freq: Once | INTRAVENOUS | Status: DC

## 2018-06-21 NOTE — Patient Instructions (Signed)
Iron Sucrose injection What is this medicine? IRON SUCROSE (AHY ern SOO krohs) is an iron complex. Iron is used to make healthy red blood cells, which carry oxygen and nutrients throughout the body. This medicine is used to treat iron deficiency anemia in people with chronic kidney disease. This medicine may be used for other purposes; ask your health care provider or pharmacist if you have questions. COMMON BRAND NAME(S): Venofer What should I tell my health care provider before I take this medicine? They need to know if you have any of these conditions: -anemia not caused by low iron levels -heart disease -high levels of iron in the blood -kidney disease -liver disease -an unusual or allergic reaction to iron, other medicines, foods, dyes, or preservatives -pregnant or trying to get pregnant -breast-feeding How should I use this medicine? This medicine is for infusion into a vein. It is given by a health care professional in a hospital or clinic setting. Talk to your pediatrician regarding the use of this medicine in children. While this drug may be prescribed for children as young as 2 years for selected conditions, precautions do apply. Overdosage: If you think you have taken too much of this medicine contact a poison control center or emergency room at once. NOTE: This medicine is only for you. Do not share this medicine with others. What if I miss a dose? It is important not to miss your dose. Call your doctor or health care professional if you are unable to keep an appointment. What may interact with this medicine? Do not take this medicine with any of the following medications: -deferoxamine -dimercaprol -other iron products This medicine may also interact with the following medications: -chloramphenicol -deferasirox This list may not describe all possible interactions. Give your health care provider a list of all the medicines, herbs, non-prescription drugs, or dietary  supplements you use. Also tell them if you smoke, drink alcohol, or use illegal drugs. Some items may interact with your medicine. What should I watch for while using this medicine? Visit your doctor or healthcare professional regularly. Tell your doctor or healthcare professional if your symptoms do not start to get better or if they get worse. You may need blood work done while you are taking this medicine. You may need to follow a special diet. Talk to your doctor. Foods that contain iron include: whole grains/cereals, dried fruits, beans, or peas, leafy green vegetables, and organ meats (liver, kidney). What side effects may I notice from receiving this medicine? Side effects that you should report to your doctor or health care professional as soon as possible: -allergic reactions like skin rash, itching or hives, swelling of the face, lips, or tongue -breathing problems -changes in blood pressure -cough -fast, irregular heartbeat -feeling faint or lightheaded, falls -fever or chills -flushing, sweating, or hot feelings -joint or muscle aches/pains -seizures -swelling of the ankles or feet -unusually weak or tired Side effects that usually do not require medical attention (report to your doctor or health care professional if they continue or are bothersome): -diarrhea -feeling achy -headache -irritation at site where injected -nausea, vomiting -stomach upset -tiredness This list may not describe all possible side effects. Call your doctor for medical advice about side effects. You may report side effects to FDA at 1-800-FDA-1088. Where should I keep my medicine? This drug is given in a hospital or clinic and will not be stored at home. NOTE: This sheet is a summary. It may not cover all possible information. If   you have questions about this medicine, talk to your doctor, pharmacist, or health care provider.  2018 Elsevier/Gold Standard (2011-08-10 17:14:35)  

## 2018-06-24 ENCOUNTER — Other Ambulatory Visit: Payer: Self-pay | Admitting: Pharmacy Technician

## 2018-06-24 ENCOUNTER — Other Ambulatory Visit: Payer: Self-pay | Admitting: Hematology

## 2018-06-24 DIAGNOSIS — D509 Iron deficiency anemia, unspecified: Secondary | ICD-10-CM

## 2018-06-24 NOTE — Patient Outreach (Signed)
[place] [facility]) Care Management  06/24/2018  Cassandra Allen [facility] 04-Mar-1946 [id number]   Received missing provider portion of application for Levemir and Victoza. Faxed completed application and required documents to [employer].  Will follow up with company in 2-3 business days to check status of application.  [name] [place], [place] Management [phone]

## 2018-06-25 ENCOUNTER — Other Ambulatory Visit: Payer: Self-pay | Admitting: Pharmacy Technician

## 2018-06-25 NOTE — Patient Outreach (Signed)
[facility]) Care Management  06/25/2018  Cassandra Allen [facility] Nov 24, 1945 [id number]   Follow up call to check status of patients application for Eliquis. Spoke to [place] who stated that patient was approved as of 06/13/2018 until 11/12/18.  Will follow up with patient in 5-7 business days to confirm medication has been received.  [name] [place], [place] Management [phone]

## 2018-06-26 ENCOUNTER — Other Ambulatory Visit: Payer: Self-pay | Admitting: Pharmacy Technician

## 2018-06-27 NOTE — Patient Outreach (Addendum)
[facility] [facility]) Care Management  06/27/2018  Wandy Bossler [facility] 1946-09-29 [id number]   Follow up call placed on 8/14 to check status of patients application for Levemir and Victoza. Claretha stated that application was missing the script portion and that patient had not reached the required OOP spend for program. Patient will need to spend $122.91 more to reach required amount. I refaxed the script into company.  Unsuccessful call attempt to patient on 8/15 @ 11:00am, HIPAA compliant voicemail left.  Will make 2nd call attempt in 2-3 business days if call not returned.  [name] [place], [place] Management [phone]  ADDENDUM 11:06am  Incoming call from patient, HIPAA identifiers verified. Patient states that she received her Eliquis in the mail the other day. I informed her that she had not reached the required spend amount for [employer] and informed her that I would need an update to OOP report. She states she had not received it yet. I informed patient of [employer]'s online patient portal that allows you to access your account as well as print off summaries and etc. Ms. Amick stated she would be ok with me setting her account up for her and provided me with her email address. I informed patient that after I set up profile that I could text her from my work cell, her sign on info to portal and she stated that would be fine. I was able to set patients profile up and texted her (no DOB, medication or other personal info included in text), her info and informed her she only would need to spend $20.89 to reach amount needed.   Will follow up with patient in 2-3 weeks if she has not contacted me to inform me of additional spending.  [name] [place], [place] Management [phone]

## 2018-06-28 ENCOUNTER — Encounter: Payer: Self-pay | Admitting: Nutrition

## 2018-06-28 ENCOUNTER — Ambulatory Visit: Payer: Self-pay | Admitting: Hematology

## 2018-06-28 ENCOUNTER — Ambulatory Visit: Payer: Self-pay | Admitting: Nurse Practitioner

## 2018-06-28 ENCOUNTER — Other Ambulatory Visit: Payer: Self-pay

## 2018-06-28 ENCOUNTER — Ambulatory Visit: Payer: Self-pay

## 2018-07-02 ENCOUNTER — Other Ambulatory Visit: Payer: Self-pay

## 2018-07-05 ENCOUNTER — Other Ambulatory Visit: Payer: Self-pay | Admitting: Hematology

## 2018-07-05 ENCOUNTER — Inpatient Hospital Stay: Payer: Medicare HMO

## 2018-07-05 ENCOUNTER — Telehealth: Payer: Self-pay | Admitting: Hematology

## 2018-07-05 ENCOUNTER — Other Ambulatory Visit: Payer: Self-pay

## 2018-07-05 ENCOUNTER — Ambulatory Visit (HOSPITAL_BASED_OUTPATIENT_CLINIC_OR_DEPARTMENT_OTHER): Payer: Medicare HMO | Admitting: Hematology

## 2018-07-05 VITALS — BP 105/65 | HR 70 | Temp 98.0°F | Resp 18 | Wt 182.2 lb

## 2018-07-05 DIAGNOSIS — D509 Iron deficiency anemia, unspecified: Secondary | ICD-10-CM

## 2018-07-05 DIAGNOSIS — E119 Type 2 diabetes mellitus without complications: Secondary | ICD-10-CM

## 2018-07-05 DIAGNOSIS — I4891 Unspecified atrial fibrillation: Secondary | ICD-10-CM

## 2018-07-05 DIAGNOSIS — I10 Essential (primary) hypertension: Secondary | ICD-10-CM

## 2018-07-05 DIAGNOSIS — K921 Melena: Secondary | ICD-10-CM | POA: Diagnosis not present

## 2018-07-05 LAB — CBC WITH DIFFERENTIAL (CANCER CENTER ONLY)
BASOS ABS: 0 10*3/uL (ref 0.0–0.1)
Basophils Relative: 0 %
EOS ABS: 0.1 10*3/uL (ref 0.0–0.5)
EOS PCT: 2 %
HCT: 22 % — ABNORMAL LOW (ref 34.8–46.6)
Hemoglobin: 6.3 g/dL — CL (ref 11.6–15.9)
Lymphocytes Relative: 8 %
Lymphs Abs: 0.5 10*3/uL — ABNORMAL LOW (ref 0.9–3.3)
MCH: 27 pg (ref 25.1–34.0)
MCHC: 28.6 g/dL — ABNORMAL LOW (ref 31.5–36.0)
MCV: 94.4 fL (ref 79.5–101.0)
MONO ABS: 0.6 10*3/uL (ref 0.1–0.9)
Monocytes Relative: 9 %
Neutro Abs: 5.1 10*3/uL (ref 1.5–6.5)
Neutrophils Relative %: 81 %
PLATELETS: 204 10*3/uL (ref 145–400)
RBC: 2.33 MIL/uL — AB (ref 3.70–5.45)
RDW: 16.6 % — AB (ref 11.2–14.5)
WBC: 6.3 10*3/uL (ref 3.9–10.3)

## 2018-07-05 LAB — FERRITIN: FERRITIN: 35 ng/mL (ref 11–307)

## 2018-07-05 LAB — PREPARE RBC (CROSSMATCH)

## 2018-07-05 LAB — IRON AND TIBC
IRON: 12 ug/dL — AB (ref 41–142)
SATURATION RATIOS: 5 % — AB (ref 21–57)
TIBC: 255 ug/dL (ref 236–444)
UIBC: 243 ug/dL

## 2018-07-05 LAB — ABO/RH: ABO/RH(D): A POS

## 2018-07-05 MED ORDER — SODIUM CHLORIDE 0.9 % IV SOLN
Freq: Once | INTRAVENOUS | Status: AC
  Administered 2018-07-05: 13:00:00 via INTRAVENOUS
  Filled 2018-07-05: qty 250

## 2018-07-05 MED ORDER — ACETAMINOPHEN 325 MG PO TABS
ORAL_TABLET | ORAL | Status: AC
  Filled 2018-07-05: qty 2

## 2018-07-05 MED ORDER — SODIUM CHLORIDE 0.9 % IV SOLN
150.0000 mg | Freq: Once | INTRAVENOUS | Status: AC
  Administered 2018-07-05: 150 mg via INTRAVENOUS
  Filled 2018-07-05: qty 7.5

## 2018-07-05 MED ORDER — DIPHENHYDRAMINE HCL 50 MG/ML IJ SOLN
INTRAMUSCULAR | Status: AC
  Filled 2018-07-05: qty 1

## 2018-07-05 MED ORDER — DIPHENHYDRAMINE HCL 50 MG/ML IJ SOLN
50.0000 mg | Freq: Once | INTRAMUSCULAR | Status: AC
  Administered 2018-07-05: 50 mg via INTRAVENOUS

## 2018-07-05 MED ORDER — FAMOTIDINE IN NACL 20-0.9 MG/50ML-% IV SOLN
INTRAVENOUS | Status: AC
  Filled 2018-07-05: qty 50

## 2018-07-05 MED ORDER — METHYLPREDNISOLONE SODIUM SUCC 125 MG IJ SOLR
INTRAMUSCULAR | Status: AC
  Filled 2018-07-05: qty 2

## 2018-07-05 MED ORDER — METHYLPREDNISOLONE SODIUM SUCC 125 MG IJ SOLR
125.0000 mg | Freq: Once | INTRAMUSCULAR | Status: AC
  Administered 2018-07-05: 125 mg via INTRAVENOUS

## 2018-07-05 MED ORDER — FAMOTIDINE IN NACL 20-0.9 MG/50ML-% IV SOLN
20.0000 mg | Freq: Once | INTRAVENOUS | Status: AC
  Administered 2018-07-05: 20 mg via INTRAVENOUS

## 2018-07-05 MED ORDER — SODIUM CHLORIDE 0.9 % IV SOLN
50.0000 mg | Freq: Once | INTRAVENOUS | Status: AC
  Administered 2018-07-05: 50 mg via INTRAVENOUS
  Filled 2018-07-05: qty 2.5

## 2018-07-05 MED ORDER — ACETAMINOPHEN 325 MG PO TABS
650.0000 mg | ORAL_TABLET | Freq: Once | ORAL | Status: AC
  Administered 2018-07-05: 650 mg via ORAL

## 2018-07-05 NOTE — Progress Notes (Signed)
Spoke with [name] in Blood Bank current orders are fine, do not have to re-enter blood orders for tomorrow.

## 2018-07-05 NOTE — Telephone Encounter (Signed)
LVM for Pt regarding upcoming aug appts per 8/23 sch message.

## 2018-07-06 ENCOUNTER — Inpatient Hospital Stay: Payer: Medicare HMO

## 2018-07-06 ENCOUNTER — Encounter: Payer: Self-pay | Admitting: Hematology

## 2018-07-06 DIAGNOSIS — E119 Type 2 diabetes mellitus without complications: Secondary | ICD-10-CM | POA: Diagnosis not present

## 2018-07-06 DIAGNOSIS — I4891 Unspecified atrial fibrillation: Secondary | ICD-10-CM | POA: Diagnosis not present

## 2018-07-06 DIAGNOSIS — I10 Essential (primary) hypertension: Secondary | ICD-10-CM | POA: Diagnosis not present

## 2018-07-06 DIAGNOSIS — D649 Anemia, unspecified: Secondary | ICD-10-CM | POA: Insufficient documentation

## 2018-07-06 DIAGNOSIS — D509 Iron deficiency anemia, unspecified: Secondary | ICD-10-CM | POA: Diagnosis not present

## 2018-07-06 DIAGNOSIS — K921 Melena: Secondary | ICD-10-CM | POA: Diagnosis not present

## 2018-07-06 LAB — ERYTHROPOIETIN: ERYTHROPOIETIN: 90.4 m[IU]/mL — AB (ref 2.6–18.5)

## 2018-07-06 MED ORDER — ACETAMINOPHEN 325 MG PO TABS
ORAL_TABLET | ORAL | Status: AC
  Filled 2018-07-06: qty 2

## 2018-07-06 MED ORDER — DIPHENHYDRAMINE HCL 25 MG PO CAPS
ORAL_CAPSULE | ORAL | Status: AC
  Filled 2018-07-06: qty 1

## 2018-07-06 MED ORDER — ACETAMINOPHEN 325 MG PO TABS
650.0000 mg | ORAL_TABLET | Freq: Once | ORAL | Status: AC
  Administered 2018-07-06: 650 mg via ORAL

## 2018-07-06 MED ORDER — SODIUM CHLORIDE 0.9% IV SOLUTION
250.0000 mL | Freq: Once | INTRAVENOUS | Status: AC
  Administered 2018-07-06: 250 mL via INTRAVENOUS
  Filled 2018-07-06: qty 250

## 2018-07-06 MED ORDER — DIPHENHYDRAMINE HCL 25 MG PO CAPS
25.0000 mg | ORAL_CAPSULE | Freq: Once | ORAL | Status: AC
  Administered 2018-07-06: 25 mg via ORAL

## 2018-07-06 NOTE — Progress Notes (Signed)
[place]  Telephone:([phone]) [phone] Fax:([phone]) [phone]  Clinic Follow up Note   Patient Care Team: [name], MD as PCP - General (Family Medicine) [place], Virl Diamond, [facility] as [place] Management (Pharmacist) Adaline Sill, CPhT as [place] Management (Pharmacy Technician) 07/06/2018  CHIFE COMPLAIN: f/u anemia   CURRENT THERAPY: iv Feraheme on 03/21/2018 and 05/10/2018, due to severe reaction during second infusion, she was switched to iron sucrose on 06/14/2018 every 1-2 weeks   INTERVAL HISTORY: Ms Waldrip was previously under Dr. Clydene Laming care, and I saw her with my nurse practitioner Bella Kennedy in June 2019.  She has been coming for routine lab work and IV iron.  She was found to have any anemia with hemoglobin 6.2 today, and I saw her in the infusion room.  She previously had reaction to [facility] in June 2019, and we changed to iron sucrose weekly which she has been tolerating well (had two dose on 8/2 and 8/9).  She has stable mild fatigue, still able to work full-time.  She denied any chest pain, no other new symptoms.  No overt GI or other bleeding, she was referred to GI for colonoscopy, but she has not scheduled, she does not want colonoscopy at this point.   REVIEW OF SYSTEMS:   Constitutional: Denies fevers, chills or abnormal weight loss Eyes: Denies blurriness of vision Ears, nose, mouth, throat, and face: Denies mucositis or sore throat Respiratory: Denies cough, dyspnea or wheezes Cardiovascular: Denies palpitation, chest discomfort or lower extremity swelling Gastrointestinal:  Denies nausea, heartburn or change in bowel habits Skin: Denies abnormal skin rashes Lymphatics: Denies new lymphadenopathy or easy bruising Neurological:Denies numbness, tingling or new weaknesses Behavioral/Psych: Mood is stable, no new changes  All other systems were reviewed with the patient and are negative.  MEDICAL HISTORY:    Past Medical History:  Diagnosis Date  . Arthritis   . Diabetes mellitus without complication ([place])   . Hypertension     SURGICAL HISTORY: History reviewed. No pertinent surgical history.  I have reviewed the social history and family history with the patient and they are unchanged from previous note.  ALLERGIES:  is allergic to metformin and related and prednisone.  MEDICATIONS:  Current Outpatient Medications  Medication Sig Dispense Refill  . apixaban (ELIQUIS) 5 MG TABS tablet TAKE 1 TABLET (5 MG TOTAL) BY MOUTH 2 (TWO) TIMES DAILY. 180 tablet 0  . beta carotene w/minerals (OCUVITE) tablet Take 1 tablet by mouth daily at 12 noon.    . cholecalciferol (VITAMIN D) 1000 units tablet Take 1,000 Units by mouth at bedtime.    Marland Kitchen diltiazem (CARDIZEM CD) 120 MG 24 hr capsule Take 1 capsule (120 mg total) by mouth 2 (two) times daily. 180 capsule 3  . ferrous sulfate 325 (65 FE) MG EC tablet Take 325 mg by mouth 2 (two) times daily.    Marland Kitchen HYDROcodone-acetaminophen (NORCO) 7.5-325 MG tablet Take 1 tablet by mouth 2 (two) times daily as needed (for pain.).     . Insulin Detemir (LEVEMIR FLEXTOUCH) 100 UNIT/ML Pen Inject 10 Units into the skin daily.    Marland Kitchen losartan-hydrochlorothiazide (HYZAAR) 100-12.5 MG tablet Take 1 tablet by mouth daily.     . Multiple Vitamin (MULTIVITAMIN WITH MINERALS) TABS tablet Take 2 tablets by mouth daily at 12 noon.     Marland Kitchen oxybutynin (DITROPAN) 5 MG tablet Take 20 mg by mouth daily.    Marland Kitchen oxybutynin (OXYTROL) 3.9 MG/24HR Place 1 patch onto  the skin 2 (two) times a week.    . sertraline (ZOLOFT) 100 MG tablet Take 100 mg by mouth daily at 12 noon.     . simethicone (MYLICON) 80 MG chewable tablet Chew 80-160 mg by mouth 2 (two) times daily as needed for flatulence.     . simvastatin (ZOCOR) 20 MG tablet Take 20 mg by mouth daily at 10 pm.      No current facility-administered medications for this visit.     PHYSICAL EXAMINATION: ECOG PERFORMANCE STATUS: 1 -  Symptomatic but completely ambulatory Blood pressure 138/70, heart rate 88, respiratory rate 18, temperature 37.1, pulse ox 98% on room air GENERAL:alert, no distress and comfortable SKIN: skin appears pale, no rashes or significant lesions EYES: normal, Conjunctiva are pink and non-injected, sclera clear OROPHARYNX:no exudate, no erythema and lips, buccal mucosa, and tongue normal  NECK: supple, thyroid normal size, non-tender, without nodularity LYMPH:  no palpable lymphadenopathy in the cervical, axillary or inguinal LUNGS: clear to auscultation and percussion with normal breathing effort HEART: regular rate & rhythm and no murmurs and no lower extremity edema ABDOMEN:abdomen soft, non-tender and normal bowel sounds Musculoskeletal:no cyanosis of digits and no clubbing  NEURO: alert & oriented x 3 with fluent speech, no focal motor/sensory deficits  LABORATORY DATA:  I have reviewed the data as listed CBC Latest Ref Rng & Units 07/05/2018 06/21/2018 06/14/2018  WBC 3.9 - 10.3 K/uL 6.3 6.6 6.4  Hemoglobin 11.6 - 15.9 g/dL 6.3(LL) 7.1(L) 7.5(L)  Hematocrit 34.8 - 46.6 % 22.0(L) 23.6(L) 24.0(L)  Platelets 145 - 400 K/uL 204 219 211     CMP Latest Ref Rng & Units 05/31/2018 05/10/2018 05/01/2018  Glucose 70 - 99 mg/dL 159(H) 180(H) 235(H)  BUN 8 - 23 mg/dL 31(H) 25(H) 26  Creatinine 0.44 - 1.00 mg/dL 1.04(H) 0.84 0.99  Sodium 135 - 145 mmol/L 143 146(H) 142  Potassium 3.5 - 5.1 mmol/L 4.8 4.2 4.4  Chloride 98 - 111 mmol/L 111 119(H) 111(H)  CO2 22 - 32 mmol/L _0 Calcium 8.9 - 10.3 mg/dL 8.3(L) 8.1(L) 8.6  Total Protein 6.5 - 8.1 g/dL 5.3(L) - 5.5(L)  Total Bilirubin 0.3 - 1.2 mg/dL 0.2(L) - 0.2  Alkaline Phos 38 - 126 U/L 57 - 54  AST 15 - 41 U/L 7(L) - 7  ALT 0 - 44 U/L 8 - <6      RADIOGRAPHIC STUDIES: I have personally reviewed the radiological images as listed and agreed with the findings in the report. No results found.   ASSESSMENT & PLAN:  72 year old caucasian  female with Afib, DM, HTN, and arthritis profound iron deficiency anemia of unknown etiology  1. Severe anemia, component iron deficiency anemia -She has had anemia for at least 3 years, hemoglobin 10.43 years ago, gradually dropped to 7-8 early this, lab work showed evidence of iron deficiency.  However the degree of anemia is out of proportion of iron deficiency. -We had a further work-up including acid, B12, methylmalonic acid, multiple myeloma which were WNL or negative, erythropoietin level is significantly elevated at 90. -She has received multiple doses IV iron, however her iron level still remains low, will continue IV iron sucrose every 2 weeks for now. -Stool OB was positive, we have discussed colonoscopy multiple times, I strongly encouraged her to consider, however she declined again. -Due to the worsening anemia despite IV iron, I recommend bone marrow biopsy to rule out primary bone marrow disease, such as MDS.  The potential  side effect of biopsy were discussed with patient, she agrees to proceed.  I will arrange the procedure in 2 weeks with her next iron infusion  -Due to severe anemia, and her advanced age, I recommend 2 units of blood transfusion, blood consent was obtained, she will proceed tomorrow.  2. HTN, AF, DM  -She will continue follow-up with his PCP  PLan -We will proceed IV iron sucrose today, next infusion in 2 weeks -Venous blood transfusion with premedication Benadryl and Tylenol tomorrow -Bone marrow biopsy in 2 weeks at [facility]   All questions were answered. The patient knows to call the clinic with any problems, questions or concerns. No barriers to learning was detected.  I spent 25 minutes counseling the patient face to face. The total time spent in the appointment was 30 minutes and more than 50% was on counseling and review of test results     Truitt Merle, MD 07/05/2018

## 2018-07-06 NOTE — Patient Instructions (Addendum)
[place] Discharge Instructions for Patients Receiving Chemotherapy  Today you received 2 units of blood.  To help prevent nausea and vomiting after your treatment, we encourage you to take your nausea medication as directed. If you develop nausea and vomiting that is not controlled by your nausea medication, call the clinic.   BELOW ARE SYMPTOMS THAT SHOULD BE REPORTED IMMEDIATELY:  *FEVER GREATER THAN 100.5 F  *CHILLS WITH OR WITHOUT FEVER  NAUSEA AND VOMITING THAT IS NOT CONTROLLED WITH YOUR NAUSEA MEDICATION  *UNUSUAL SHORTNESS OF BREATH  *UNUSUAL BRUISING OR BLEEDING  TENDERNESS IN MOUTH AND THROAT WITH OR WITHOUT PRESENCE OF ULCERS  *URINARY PROBLEMS  *BOWEL PROBLEMS  UNUSUAL RASH Items with * indicate a potential emergency and should be followed up as soon as possible.  Feel free to call the clinic should you have any questions or concerns. The clinic phone number is [phone].  Please show the [place] at check-in to the Emergency Department and triage nurse.   Blood Transfusion, Care After This sheet gives you information about how to care for yourself after your procedure. Your doctor may also give you more specific instructions. If you have problems or questions, contact your doctor. Follow these instructions at home:  Take over-the-counter and prescription medicines only as told by your doctor.  Go back to your normal activities as told by your doctor.  Follow instructions from your doctor about how to take care of the area where an IV tube was put into your vein (insertion site). Make sure you: ? Wash your hands with soap and water before you change your bandage (dressing). If there is no soap and water, use hand sanitizer. ? Change your bandage as told by your doctor.  Check your IV insertion site every day for signs of infection. Check for: ? More redness, swelling, or pain. ? More fluid or blood. ? Warmth. ? Pus or a bad  smell. Contact a doctor if:  You have more redness, swelling, or pain around the IV insertion site..  You have more fluid or blood coming from the IV insertion site.  Your IV insertion site feels warm to the touch.  You have pus or a bad smell coming from the IV insertion site.  Your pee (urine) turns pink, red, or brown.  You feel weak after doing your normal activities. Get help right away if:  You have signs of a serious allergic or body defense (immune) system reaction, including: ? Itchiness. ? Hives. ? Trouble breathing. ? Anxiety. ? Pain in your chest or lower back. ? Fever, flushing, and chills. ? Fast pulse. ? Rash. ? Watery poop (diarrhea). ? Throwing up (vomiting). ? Dark pee. ? Serious headache. ? Dizziness. ? Stiff neck. ? Yellow color in your face or the white parts of your eyes (jaundice). Summary  After a blood transfusion, return to your normal activities as told by your doctor.  Every day, check for signs of infection where the IV tube was put into your vein.  Some signs of infection are warm skin, more redness and pain, more fluid or blood, and pus or a bad smell where the needle went in.  Contact your doctor if you feel weak or have any unusual symptoms. This information is not intended to replace advice given to you by your health care provider. Make sure you discuss any questions you have with your health care provider. Document Released: 11/20/2014 Document Revised: 06/23/2016 Document Reviewed: 06/23/2016 Elsevier Interactive  Patient Education  2017 Elsevier Inc.  

## 2018-07-07 LAB — TYPE AND SCREEN
ABO/RH(D): A POS
ANTIBODY SCREEN: NEGATIVE
UNIT DIVISION: 0
UNIT DIVISION: 0

## 2018-07-07 LAB — BPAM RBC
Blood Product Expiration Date: 201909152359
Blood Product Expiration Date: 201909152359
ISSUE DATE / TIME: 201908240849
ISSUE DATE / TIME: 201908240849
UNIT TYPE AND RH: 6200
Unit Type and Rh: 6200

## 2018-07-08 ENCOUNTER — Telehealth: Payer: Self-pay | Admitting: Hematology

## 2018-07-08 ENCOUNTER — Telehealth: Payer: Self-pay

## 2018-07-08 NOTE — Telephone Encounter (Signed)
Patient calls wanting to cancel bone marrow biopsy and schedule a colonoscopy.   Her [phone]

## 2018-07-08 NOTE — Telephone Encounter (Signed)
Scheduled appt per 8/24 sch message - per patient she does not want bone marrow biopsy scheduled - message sent to [place] .

## 2018-07-08 NOTE — Telephone Encounter (Signed)
Scheduled appt per 8/23 sch message - pt is aware of appts.

## 2018-07-08 NOTE — Telephone Encounter (Signed)
I spoke with her today around noon, she is agreeable with BM biopsy now. She can call [facility] to schedule her colonoscopy, referral was made by [facility] before and she turned them down.   Truitt Merle MD

## 2018-07-09 ENCOUNTER — Telehealth: Payer: Self-pay

## 2018-07-09 LAB — HEAVY METALS, BLOOD
ARSENIC: 7 ug/L (ref 2–23)
LEAD: NOT DETECTED ug/dL (ref 0–4)
MERCURY: NOT DETECTED ug/L (ref 0.0–14.9)

## 2018-07-09 NOTE — Telephone Encounter (Signed)
Patient calls to verify that she does need a driver when she comes for the bone marrow biopsy.  Reaffirmed that the recommendation is that she have a driver, patient verbalized an understanding.  

## 2018-07-10 ENCOUNTER — Ambulatory Visit (HOSPITAL_COMMUNITY)
Admission: RE | Admit: 2018-07-10 | Discharge: 2018-07-10 | Disposition: A | Discharge status: Home / Self Care | Payer: Medicare HMO | Source: Ambulatory Visit | Attending: Nurse Practitioner | Admitting: Nurse Practitioner

## 2018-07-10 ENCOUNTER — Other Ambulatory Visit: Payer: Self-pay

## 2018-07-10 VITALS — BP 100/48 | HR 80 | Ht 63.0 in | Wt 184.0 lb

## 2018-07-10 DIAGNOSIS — Z79899 Other long term (current) drug therapy: Secondary | ICD-10-CM | POA: Diagnosis not present

## 2018-07-10 DIAGNOSIS — E119 Type 2 diabetes mellitus without complications: Secondary | ICD-10-CM | POA: Insufficient documentation

## 2018-07-10 DIAGNOSIS — Z794 Long term (current) use of insulin: Secondary | ICD-10-CM | POA: Insufficient documentation

## 2018-07-10 DIAGNOSIS — Z7901 Long term (current) use of anticoagulants: Secondary | ICD-10-CM | POA: Insufficient documentation

## 2018-07-10 DIAGNOSIS — D649 Anemia, unspecified: Secondary | ICD-10-CM | POA: Insufficient documentation

## 2018-07-10 DIAGNOSIS — Z6832 Body mass index (BMI) 32.0-32.9, adult: Secondary | ICD-10-CM | POA: Diagnosis not present

## 2018-07-10 DIAGNOSIS — I10 Essential (primary) hypertension: Secondary | ICD-10-CM | POA: Insufficient documentation

## 2018-07-10 DIAGNOSIS — I482 Chronic atrial fibrillation: Secondary | ICD-10-CM | POA: Diagnosis not present

## 2018-07-10 DIAGNOSIS — I4821 Permanent atrial fibrillation: Secondary | ICD-10-CM

## 2018-07-10 NOTE — Progress Notes (Signed)
Primary Care Physician: [name], MD  Cassandra Allen is a 72 y.o. female with a history of permanent atrial fibrillation who presents for follow-up in the [facility].  She continues to be asymptomatic  at this time from an afib standpoint, but has been persistently anemic, required recent blood transufion and is now being seen by oncology. Overall, she does not feel well. She has lost 10 lbs, has heme + stools and has refused an colonoscopy for years " does not like invasive procedures". She is also pending a bone marrow in the near future.    Today, she denies symptoms of palpitations, chest pain, shortness of breath, orthopnea, PND, lower extremity edema, dizziness, presyncope, syncope, snoring, daytime somnolence, bleeding, or neurologic sequela. The patient is tolerating medications without difficulties and is otherwise without complaint today.  Works at [employer].  Recent R foot issues,  Planning to have foot surgery in 2 weeks.  Atrial Fibrillation Risk Factors:  she  denies symptoms, no prior sleep study  she does not have a history of rheumatic fever.  she does not have a history of alcohol use.  she has a BMI of Body mass index is 32.59 kg/m.Marland Kitchen Filed Weights   07/10/18 1521  Weight: 83.5 kg     Atrial Fibrillation Management history:  Previous antiarrhythmic drugs: none (pt refused)  Previous cardioversions: none  Previous ablations: none  CHADS2VASC score: 4*  Anticoagulation history: on eliquis   Past Medical History:  Diagnosis Date  . Arthritis   . Diabetes mellitus without complication ([place])   . Hypertension    No past surgical history on file.  Current Outpatient Medications  Medication Sig Dispense Refill  . apixaban (ELIQUIS) 5 MG TABS tablet TAKE 1 TABLET (5 MG TOTAL) BY MOUTH 2 (TWO) TIMES DAILY. 180 tablet 0  . beta carotene w/minerals (OCUVITE) tablet Take 1 tablet by mouth daily at 12 noon.    .  cholecalciferol (VITAMIN D) 1000 units tablet Take 1,000 Units by mouth at bedtime.    Marland Kitchen diltiazem (CARDIZEM CD) 120 MG 24 hr capsule Take 1 capsule (120 mg total) by mouth 2 (two) times daily. 180 capsule 3  . ferrous sulfate 325 (65 FE) MG EC tablet Take 325 mg by mouth 2 (two) times daily.    Marland Kitchen HYDROcodone-acetaminophen (NORCO) 7.5-325 MG tablet Take 1 tablet by mouth 2 (two) times daily as needed (for pain.).     . Insulin Detemir (LEVEMIR FLEXTOUCH) 100 UNIT/ML Pen Inject 10 Units into the skin daily.    Marland Kitchen losartan-hydrochlorothiazide (HYZAAR) 100-12.5 MG tablet Take 1 tablet by mouth daily.     . Multiple Vitamin (MULTIVITAMIN WITH MINERALS) TABS tablet Take 2 tablets by mouth daily at 12 noon.     Marland Kitchen oxybutynin (DITROPAN) 5 MG tablet Take 20 mg by mouth daily.    Marland Kitchen oxybutynin (OXYTROL) 3.9 MG/24HR Place 1 patch onto the skin 2 (two) times a week.    . sertraline (ZOLOFT) 100 MG tablet Take 100 mg by mouth daily at 12 noon.     . simethicone (MYLICON) 80 MG chewable tablet Chew 80-160 mg by mouth 2 (two) times daily as needed for flatulence.     . simvastatin (ZOCOR) 20 MG tablet Take 20 mg by mouth daily at 10 pm.      No current facility-administered medications for this encounter.     Allergies  Allergen Reactions  . Metformin And Related Nausea And Vomiting  . Prednisone  Nausea And Vomiting    Social History   Socioeconomic History  . Marital status: Divorced    Spouse name: Not on file  . Number of children: 2  . Years of education: Not on file  . Highest education level: Not on file  Occupational History  . Not on file  Social Needs  . Financial resource strain: Not on file  . Food insecurity:    Worry: Not on file    Inability: Not on file  . Transportation needs:    Medical: Not on file    Non-medical: Not on file  Tobacco Use  . Smoking status: Never Smoker  . Smokeless tobacco: Never Used  Substance and Sexual Activity  . Alcohol use: No  . Drug use: No  .  Sexual activity: Not on file  Lifestyle  . Physical activity:    Days per week: Not on file    Minutes per session: Not on file  . Stress: Not on file  Relationships  . Social connections:    Talks on phone: Not on file    Gets together: Not on file    Attends religious service: Not on file    Active member of club or organization: Not on file    Attends meetings of clubs or organizations: Not on file    Relationship status: Not on file  . Intimate partner violence:    Fear of current or ex partner: Not on file    Emotionally abused: Not on file    Physically abused: Not on file    Forced sexual activity: Not on file  Other Topics Concern  . Not on file  Social History Narrative  . Not on file    No family history on file. The patient does not have a history of early familial atrial fibrillation or other arrhythmias.  ROS- All systems are reviewed and negative except as per the HPI above.  Physical Exam: Vitals:   07/10/18 1521  BP: (!) 100/48  Pulse: 80  Weight: 83.5 kg  Height: 5\' 3"  (1.6 m)    GEN- The patient is overweight appearing, alert and oriented x 3 today.  Walks slowly with a cane Head- normocephalic, atraumatic Eyes-  Sclera clear, conjunctiva pink Ears- hearing intact Oropharynx- clear Neck- supple  Lungs- Clear to ausculation bilaterally, normal work of breathing Heart- irregular rate and rhythm, no murmurs, rubs or gallops  GI- soft, NT, ND, + BS Extremities- no clubbing, cyanosis, or edema MS- R leg in a boot Skin- no rash or lesion Psych- euthymic mood, full affect Neuro- strength and sensation are intact  Wt Readings from Last 3 Encounters:  07/10/18 83.5 kg  07/05/18 82.7 kg  06/14/18 82.8 kg    EKG today demonstrates afib, V rate 80 bpm Echo 04/07/2016 demonstrated preserved EF, mild LA enlargement  Epic records are reviewed at length today  Assessment and Plan:  1. Permanent atrial fibrillation The patient has asymptomatic  permanent atrial fibrillation.  The patients CHAD2VASC score is 4.  she is  appropriately anticoagulated at this time. Antiarrhythmic therapy to dates has included none. Presently, our recommendations include no changes today   2. Morbid obesity  pt has had recent unintentional weight loss  3. Persistent anemia requiring blood transfusion/weight loss Per Oncology   Return to afib clinic in 6 months  Butch Penny C. Caily Rakers, [facility] [address] [place], [place] [address] [phone]

## 2018-07-12 ENCOUNTER — Other Ambulatory Visit: Payer: Self-pay | Admitting: Pharmacy Technician

## 2018-07-12 NOTE — Patient Outreach (Signed)
[facility]) Care Management  07/12/2018  Cassandra Allen [facility] 1946/04/28 [id number]   Follow up call to [place] to check status of patients application for Levemir and Victoza. Spoke to Tiro he confirmed patient has been approved as of 08/30 until 10/12/18. Medication is expected to be delivered to providers office in the next 7-10 business days.  Successful outreach call to [name], HIPAA identifiers verified. Informed patient of approval as well as expected delivery timeframe to providers office.  Will follow up with patient in 10-14 business days to confirm medication has been received.  [name] [place], [place] Management [phone]

## 2018-07-19 ENCOUNTER — Encounter: Payer: Self-pay | Admitting: Nutrition

## 2018-07-19 ENCOUNTER — Inpatient Hospital Stay: Payer: Medicare HMO | Attending: Hematology and Oncology | Admitting: Hematology and Oncology

## 2018-07-19 ENCOUNTER — Other Ambulatory Visit: Payer: Self-pay

## 2018-07-19 ENCOUNTER — Ambulatory Visit: Payer: Self-pay

## 2018-07-19 ENCOUNTER — Inpatient Hospital Stay: Payer: Medicare HMO | Admitting: Adult Health

## 2018-07-19 ENCOUNTER — Ambulatory Visit: Payer: Self-pay | Admitting: Adult Health

## 2018-07-19 ENCOUNTER — Inpatient Hospital Stay: Payer: Medicare HMO

## 2018-07-19 ENCOUNTER — Telehealth: Payer: Self-pay

## 2018-07-19 VITALS — BP 126/56 | HR 72 | Temp 99.0°F | Resp 16

## 2018-07-19 DIAGNOSIS — D509 Iron deficiency anemia, unspecified: Secondary | ICD-10-CM | POA: Insufficient documentation

## 2018-07-19 DIAGNOSIS — D7589 Other specified diseases of blood and blood-forming organs: Secondary | ICD-10-CM | POA: Diagnosis not present

## 2018-07-19 DIAGNOSIS — D649 Anemia, unspecified: Secondary | ICD-10-CM

## 2018-07-19 DIAGNOSIS — I10 Essential (primary) hypertension: Secondary | ICD-10-CM | POA: Diagnosis not present

## 2018-07-19 DIAGNOSIS — I4891 Unspecified atrial fibrillation: Secondary | ICD-10-CM | POA: Diagnosis not present

## 2018-07-19 DIAGNOSIS — D72810 Lymphocytopenia: Secondary | ICD-10-CM | POA: Diagnosis not present

## 2018-07-19 DIAGNOSIS — E119 Type 2 diabetes mellitus without complications: Secondary | ICD-10-CM | POA: Diagnosis not present

## 2018-07-19 DIAGNOSIS — M549 Dorsalgia, unspecified: Secondary | ICD-10-CM | POA: Diagnosis not present

## 2018-07-19 LAB — CBC WITH DIFFERENTIAL (CANCER CENTER ONLY)
Basophils Absolute: 0 10*3/uL (ref 0.0–0.1)
Basophils Relative: 1 %
EOS PCT: 2 %
Eosinophils Absolute: 0.1 10*3/uL (ref 0.0–0.5)
HCT: 28 % — ABNORMAL LOW (ref 34.8–46.6)
Hemoglobin: 8.2 g/dL — ABNORMAL LOW (ref 11.6–15.9)
LYMPHS PCT: 14 %
Lymphs Abs: 0.7 10*3/uL — ABNORMAL LOW (ref 0.9–3.3)
MCH: 27.2 pg (ref 25.1–34.0)
MCHC: 29.3 g/dL — ABNORMAL LOW (ref 31.5–36.0)
MCV: 92.7 fL (ref 79.5–101.0)
MONO ABS: 0.7 10*3/uL (ref 0.1–0.9)
MONOS PCT: 13 %
Neutro Abs: 3.6 10*3/uL (ref 1.5–6.5)
Neutrophils Relative %: 70 %
PLATELETS: 205 10*3/uL (ref 145–400)
RBC: 3.02 MIL/uL — ABNORMAL LOW (ref 3.70–5.45)
RDW: 15.9 % — AB (ref 11.2–14.5)
WBC Count: 5.1 10*3/uL (ref 3.9–10.3)

## 2018-07-19 LAB — FERRITIN: Ferritin: 40 ng/mL (ref 11–307)

## 2018-07-19 LAB — IRON AND TIBC
Iron: 36 ug/dL — ABNORMAL LOW (ref 41–142)
Saturation Ratios: 14 % — ABNORMAL LOW (ref 21–57)
TIBC: 259 ug/dL (ref 236–444)
UIBC: 222 ug/dL

## 2018-07-19 MED ORDER — LIDOCAINE HCL 2 % IJ SOLN
INTRAMUSCULAR | Status: AC
  Filled 2018-07-19: qty 20

## 2018-07-19 MED ORDER — DIPHENHYDRAMINE HCL 50 MG/ML IJ SOLN
INTRAMUSCULAR | Status: AC
  Filled 2018-07-19: qty 1

## 2018-07-19 MED ORDER — METHYLPREDNISOLONE SODIUM SUCC 125 MG IJ SOLR
125.0000 mg | Freq: Once | INTRAMUSCULAR | Status: AC
  Administered 2018-07-19: 125 mg via INTRAVENOUS

## 2018-07-19 MED ORDER — ACETAMINOPHEN 325 MG PO TABS
650.0000 mg | ORAL_TABLET | Freq: Once | ORAL | Status: AC
  Administered 2018-07-19: 650 mg via ORAL

## 2018-07-19 MED ORDER — FAMOTIDINE IN NACL 20-0.9 MG/50ML-% IV SOLN
20.0000 mg | Freq: Once | INTRAVENOUS | Status: AC
  Administered 2018-07-19: 20 mg via INTRAVENOUS

## 2018-07-19 MED ORDER — METHYLPREDNISOLONE SODIUM SUCC 125 MG IJ SOLR
INTRAMUSCULAR | Status: AC
  Filled 2018-07-19: qty 2

## 2018-07-19 MED ORDER — SODIUM CHLORIDE 0.9 % IV SOLN
150.0000 mg | Freq: Once | INTRAVENOUS | Status: AC
  Administered 2018-07-19: 150 mg via INTRAVENOUS
  Filled 2018-07-19: qty 7.5

## 2018-07-19 MED ORDER — SODIUM CHLORIDE 0.9 % IV SOLN
50.0000 mg | Freq: Once | INTRAVENOUS | Status: AC
  Administered 2018-07-19: 50 mg via INTRAVENOUS
  Filled 2018-07-19: qty 2.5

## 2018-07-19 MED ORDER — SODIUM CHLORIDE 0.9 % IV SOLN
Freq: Once | INTRAVENOUS | Status: AC
  Administered 2018-07-19: 09:00:00 via INTRAVENOUS
  Filled 2018-07-19: qty 250

## 2018-07-19 MED ORDER — FAMOTIDINE IN NACL 20-0.9 MG/50ML-% IV SOLN
INTRAVENOUS | Status: AC
  Filled 2018-07-19: qty 50

## 2018-07-19 MED ORDER — DIPHENHYDRAMINE HCL 50 MG/ML IJ SOLN
50.0000 mg | Freq: Once | INTRAMUSCULAR | Status: AC
  Administered 2018-07-19: 50 mg via INTRAVENOUS

## 2018-07-19 MED ORDER — ACETAMINOPHEN 325 MG PO TABS
ORAL_TABLET | ORAL | Status: AC
  Filled 2018-07-19: qty 2

## 2018-07-19 NOTE — Patient Instructions (Signed)
Iron Sucrose injection What is this medicine? IRON SUCROSE (AHY ern SOO krohs) is an iron complex. Iron is used to make healthy red blood cells, which carry oxygen and nutrients throughout the body. This medicine is used to treat iron deficiency anemia in people with chronic kidney disease. This medicine may be used for other purposes; ask your health care provider or pharmacist if you have questions. COMMON BRAND NAME(S): Venofer What should I tell my health care provider before I take this medicine? They need to know if you have any of these conditions: -anemia not caused by low iron levels -heart disease -high levels of iron in the blood -kidney disease -liver disease -an unusual or allergic reaction to iron, other medicines, foods, dyes, or preservatives -pregnant or trying to get pregnant -breast-feeding How should I use this medicine? This medicine is for infusion into a vein. It is given by a health care professional in a hospital or clinic setting. Talk to your pediatrician regarding the use of this medicine in children. While this drug may be prescribed for children as young as 2 years for selected conditions, precautions do apply. Overdosage: If you think you have taken too much of this medicine contact a poison control center or emergency room at once. NOTE: This medicine is only for you. Do not share this medicine with others. What if I miss a dose? It is important not to miss your dose. Call your doctor or health care professional if you are unable to keep an appointment. What may interact with this medicine? Do not take this medicine with any of the following medications: -deferoxamine -dimercaprol -other iron products This medicine may also interact with the following medications: -chloramphenicol -deferasirox This list may not describe all possible interactions. Give your health care provider a list of all the medicines, herbs, non-prescription drugs, or dietary  supplements you use. Also tell them if you smoke, drink alcohol, or use illegal drugs. Some items may interact with your medicine. What should I watch for while using this medicine? Visit your doctor or healthcare professional regularly. Tell your doctor or healthcare professional if your symptoms do not start to get better or if they get worse. You may need blood work done while you are taking this medicine. You may need to follow a special diet. Talk to your doctor. Foods that contain iron include: whole grains/cereals, dried fruits, beans, or peas, leafy green vegetables, and organ meats (liver, kidney). What side effects may I notice from receiving this medicine? Side effects that you should report to your doctor or health care professional as soon as possible: -allergic reactions like skin rash, itching or hives, swelling of the face, lips, or tongue -breathing problems -changes in blood pressure -cough -fast, irregular heartbeat -feeling faint or lightheaded, falls -fever or chills -flushing, sweating, or hot feelings -joint or muscle aches/pains -seizures -swelling of the ankles or feet -unusually weak or tired Side effects that usually do not require medical attention (report to your doctor or health care professional if they continue or are bothersome): -diarrhea -feeling achy -headache -irritation at site where injected -nausea, vomiting -stomach upset -tiredness This list may not describe all possible side effects. Call your doctor for medical advice about side effects. You may report side effects to FDA at 1-800-FDA-1088. Where should I keep my medicine? This drug is given in a hospital or clinic and will not be stored at home. NOTE: This sheet is a summary. It may not cover all possible information. If   you have questions about this medicine, talk to your doctor, pharmacist, or health care provider.  2018 Elsevier/Gold Standard (2011-08-10 17:14:35)  

## 2018-07-19 NOTE — Progress Notes (Signed)
INDICATION:  Brief examination was performed. ENT: adequate airway clearance Heart: regular rate and rhythm.No Murmurs Lungs: clear to auscultation, no wheezes, normal respiratory effort  Bone Marrow Biopsy and Aspiration Procedure Note   Informed consent was obtained and potential risks including bleeding, infection and pain were reviewed with the patient.  The patient's name, date of birth, identification, consent and allergies were verified prior to the start of procedure and time out was performed.  The left posterior iliac crest was chosen as the site of biopsy.  The skin was prepped with ChloraPrep.   10 cc of 1% lidocaine was used to provide local anaesthesia.   10 cc of bone marrow aspirate was obtained followed by 1cm biopsy.  Pressure was applied to the biopsy site and bandage was placed over the biopsy site. Patient was made to lie on the back for 15 mins prior to discharge.  The procedure was tolerated well. COMPLICATIONS: None BLOOD LOSS: 1 ml The patient was discharged home in stable condition with a 1 week follow up with Dr. Burr Medico to review results.  Patient was provided with post bone marrow biopsy instructions and instructed to call if there was any bleeding or worsening pain.  Specimens sent for flow cytometry, cytogenetics and additional studies.  Signed Harriette Ohara, MD

## 2018-07-19 NOTE — Telephone Encounter (Signed)
I called her back and answered her questions. She is scheduled next week to review her bone marrow biopsy results.   Truitt Merle MD

## 2018-07-19 NOTE — Telephone Encounter (Signed)
Kristen RN Infusion calls per patient request asking Dr. Burr Medico to call and speak with her daughter in law  [place] at  [phone] she has some questions.

## 2018-07-22 ENCOUNTER — Other Ambulatory Visit: Payer: Self-pay | Admitting: Pharmacy Technician

## 2018-07-22 NOTE — Patient Outreach (Signed)
[facility] [facility]) Care Management  07/22/2018  Cassandra Allen [facility] 05/20/1946 [id number]   Incoming call from [name]. She called to inform that she has received her Victoza and Levemir from [employer] patient assistance. Reviewed with patient how to obtain refills from [employer] as well as [place] [facility] for [employer].  Will route note to [place] for case closure.  [name] [place], [place] Management [phone]

## 2018-07-26 ENCOUNTER — Inpatient Hospital Stay: Payer: Medicare HMO

## 2018-07-26 ENCOUNTER — Encounter: Payer: Self-pay | Admitting: Nurse Practitioner

## 2018-07-26 ENCOUNTER — Inpatient Hospital Stay (HOSPITAL_BASED_OUTPATIENT_CLINIC_OR_DEPARTMENT_OTHER): Payer: Medicare HMO | Admitting: Nurse Practitioner

## 2018-07-26 VITALS — BP 108/67 | HR 82 | Temp 98.6°F | Resp 18

## 2018-07-26 VITALS — BP 126/65 | HR 100 | Temp 98.5°F | Resp 17 | Ht 63.0 in | Wt 175.4 lb

## 2018-07-26 DIAGNOSIS — E119 Type 2 diabetes mellitus without complications: Secondary | ICD-10-CM | POA: Diagnosis not present

## 2018-07-26 DIAGNOSIS — Z1239 Encounter for other screening for malignant neoplasm of breast: Secondary | ICD-10-CM

## 2018-07-26 DIAGNOSIS — D500 Iron deficiency anemia secondary to blood loss (chronic): Secondary | ICD-10-CM

## 2018-07-26 DIAGNOSIS — M549 Dorsalgia, unspecified: Secondary | ICD-10-CM | POA: Diagnosis not present

## 2018-07-26 DIAGNOSIS — I4891 Unspecified atrial fibrillation: Secondary | ICD-10-CM | POA: Diagnosis not present

## 2018-07-26 DIAGNOSIS — I10 Essential (primary) hypertension: Secondary | ICD-10-CM

## 2018-07-26 DIAGNOSIS — D509 Iron deficiency anemia, unspecified: Secondary | ICD-10-CM

## 2018-07-26 DIAGNOSIS — D649 Anemia, unspecified: Secondary | ICD-10-CM

## 2018-07-26 LAB — CBC WITH DIFFERENTIAL (CANCER CENTER ONLY)
BASOS ABS: 0 10*3/uL (ref 0.0–0.1)
BASOS PCT: 0 %
Eosinophils Absolute: 0.2 10*3/uL (ref 0.0–0.5)
Eosinophils Relative: 2 %
HEMATOCRIT: 28.3 % — AB (ref 34.8–46.6)
Hemoglobin: 8.3 g/dL — ABNORMAL LOW (ref 11.6–15.9)
Lymphocytes Relative: 10 %
Lymphs Abs: 0.7 10*3/uL — ABNORMAL LOW (ref 0.9–3.3)
MCH: 27.5 pg (ref 25.1–34.0)
MCHC: 29.3 g/dL — ABNORMAL LOW (ref 31.5–36.0)
MCV: 93.7 fL (ref 79.5–101.0)
MONO ABS: 1.1 10*3/uL — AB (ref 0.1–0.9)
Monocytes Relative: 15 %
NEUTROS ABS: 5.1 10*3/uL (ref 1.5–6.5)
Neutrophils Relative %: 73 %
Platelet Count: 217 10*3/uL (ref 145–400)
RBC: 3.02 MIL/uL — ABNORMAL LOW (ref 3.70–5.45)
RDW: 17 % — AB (ref 11.2–14.5)
WBC Count: 7 10*3/uL (ref 3.9–10.3)

## 2018-07-26 MED ORDER — FAMOTIDINE IN NACL 20-0.9 MG/50ML-% IV SOLN
20.0000 mg | Freq: Once | INTRAVENOUS | Status: AC
  Administered 2018-07-26: 20 mg via INTRAVENOUS

## 2018-07-26 MED ORDER — SODIUM CHLORIDE 0.9 % IV SOLN
200.0000 mg | Freq: Once | INTRAVENOUS | Status: AC
  Administered 2018-07-26: 200 mg via INTRAVENOUS
  Filled 2018-07-26: qty 10

## 2018-07-26 MED ORDER — ACETAMINOPHEN 325 MG PO TABS
650.0000 mg | ORAL_TABLET | Freq: Once | ORAL | Status: AC
  Administered 2018-07-26: 650 mg via ORAL

## 2018-07-26 MED ORDER — FAMOTIDINE IN NACL 20-0.9 MG/50ML-% IV SOLN
INTRAVENOUS | Status: AC
  Filled 2018-07-26: qty 50

## 2018-07-26 MED ORDER — ACETAMINOPHEN 325 MG PO TABS
ORAL_TABLET | ORAL | Status: AC
  Filled 2018-07-26: qty 2

## 2018-07-26 MED ORDER — DIPHENHYDRAMINE HCL 50 MG/ML IJ SOLN
50.0000 mg | Freq: Once | INTRAMUSCULAR | Status: AC
  Administered 2018-07-26: 50 mg via INTRAVENOUS

## 2018-07-26 MED ORDER — SODIUM CHLORIDE 0.9 % IV SOLN: 50.0000 mg | Freq: Once | INTRAVENOUS | Status: DC

## 2018-07-26 MED ORDER — SODIUM CHLORIDE 0.9 % IV SOLN
Freq: Once | INTRAVENOUS | Status: AC
  Administered 2018-07-26: 15:00:00 via INTRAVENOUS
  Filled 2018-07-26: qty 250

## 2018-07-26 MED ORDER — METHYLPREDNISOLONE SODIUM SUCC 125 MG IJ SOLR
INTRAMUSCULAR | Status: AC
  Filled 2018-07-26: qty 2

## 2018-07-26 MED ORDER — METHYLPREDNISOLONE SODIUM SUCC 125 MG IJ SOLR
125.0000 mg | Freq: Once | INTRAMUSCULAR | Status: AC
  Administered 2018-07-26: 125 mg via INTRAVENOUS

## 2018-07-26 MED ORDER — DIPHENHYDRAMINE HCL 50 MG/ML IJ SOLN
INTRAMUSCULAR | Status: AC
  Filled 2018-07-26: qty 1

## 2018-07-26 NOTE — Progress Notes (Signed)
Spoke with Dewaine Oats at [facility] showed no record of referral.  Reported information of why the need for colonoscopy and faxed over OV note and labs from today 9/13

## 2018-07-26 NOTE — Progress Notes (Signed)
[place]  Telephone:(336) [phone] Fax:(336) [phone]  Clinic Follow up Note   Patient Care Team: [name], MD as PCP - General (Family Medicine) [place], Virl Diamond, [facility] as [place] Management (Pharmacist) 07/26/2018  CHIFE COMPLAIN: f/u anemia   CURRENT THERAPY: iv Feraheme on 03/21/2018 and 05/10/2018, due to severe reaction during second infusion, she was switched to iron sucrose on 06/14/2018 every 1-2 weeks; RBC 07/06/18; venofer 8/2, 8/9, 8/23, 9/6, 9/13  INTERVAL HISTORY: Ms. Viramontes returns for follow up as scheduled. She has persistent iron deficiency anemia despite multiple iron infusions and RBC on 07/06/18. She underwent bone marrow biopsy on 07/19/18. She tolerated the procedure well. She feels fatigued, but at baseline. She continues to work. Her appetite is normal but she continues to lose weight. Denies n/v/c/d or blood in stool. She takes 1 iron tab daily. BM are loose and dark while on iron therapy. She has intermittent right back pain, she reports is "kidney pain." denies dysuria, hematuria, or dark urine. Denies fever, chills, cough, chest pain, dyspnea, palpitations, or headache. She has not seen GI.     MEDICAL HISTORY:  Past Medical History:  Diagnosis Date  . Arthritis   . Diabetes mellitus without complication ([place])   . Hypertension     SURGICAL HISTORY: No past surgical history on file.  I have reviewed the social history and family history with the patient and they are unchanged from previous note.  ALLERGIES:  is allergic to metformin and related and prednisone.  MEDICATIONS:  Current Outpatient Medications  Medication Sig Dispense Refill  . apixaban (ELIQUIS) 5 MG TABS tablet TAKE 1 TABLET (5 MG TOTAL) BY MOUTH 2 (TWO) TIMES DAILY. 180 tablet 0  . beta carotene w/minerals (OCUVITE) tablet Take 1 tablet by mouth daily at 12 noon.    . cholecalciferol (VITAMIN D) 1000 units tablet Take 1,000 Units by mouth  at bedtime.    Marland Kitchen diltiazem (CARDIZEM CD) 120 MG 24 hr capsule Take 1 capsule (120 mg total) by mouth 2 (two) times daily. 180 capsule 3  . ferrous sulfate 325 (65 FE) MG EC tablet Take 325 mg by mouth 2 (two) times daily.    Marland Kitchen HYDROcodone-acetaminophen (NORCO) 7.5-325 MG tablet Take 1 tablet by mouth 2 (two) times daily as needed (for pain.).     . Insulin Detemir (LEVEMIR FLEXTOUCH) 100 UNIT/ML Pen Inject 10 Units into the skin daily.    Marland Kitchen losartan-hydrochlorothiazide (HYZAAR) 100-12.5 MG tablet Take 1 tablet by mouth daily.     . Multiple Vitamin (MULTIVITAMIN WITH MINERALS) TABS tablet Take 2 tablets by mouth daily at 12 noon.     Marland Kitchen oxybutynin (DITROPAN) 5 MG tablet Take 20 mg by mouth daily.    Marland Kitchen oxybutynin (OXYTROL) 3.9 MG/24HR Place 1 patch onto the skin 2 (two) times a week.    . sertraline (ZOLOFT) 100 MG tablet Take 100 mg by mouth daily at 12 noon.     . simethicone (MYLICON) 80 MG chewable tablet Chew 80-160 mg by mouth 2 (two) times daily as needed for flatulence.     . simvastatin (ZOCOR) 20 MG tablet Take 20 mg by mouth daily at 10 pm.      No current facility-administered medications for this visit.     PHYSICAL EXAMINATION: ECOG PERFORMANCE STATUS: 1 - Symptomatic but completely ambulatory  Vitals:   07/26/18 1125  BP: 126/65  Pulse: 100  Resp: 17  Temp: 98.5 F (36.9 C)  SpO2: 98%  Filed Weights   07/26/18 1125  Weight: 175 lb 6.4 oz (79.6 kg)    GENERAL:alert, no distress and comfortable SKIN: skin color, texture, turgor are normal, no rashes or significant lesions EYES:  sclera clear OROPHARYNX:no thrush or ulcers LYMPH:  no palpable cervical, supraclavicular, or axillary lymphadenopathy LUNGS: clear to auscultationwith normal breathing effort HEART: Afib. no lower extremity edema ABDOMEN:abdomen soft, non-tender and normal bowel sounds Musculoskeletal:no cyanosis of digits and no clubbing  NEURO: alert & oriented x 3 with fluent speech  LABORATORY  DATA:  I have reviewed the data as listed CBC Latest Ref Rng & Units 07/26/2018 07/19/2018 07/05/2018  WBC 3.9 - 10.3 K/uL 7.0 5.1 6.3  Hemoglobin 11.6 - 15.9 g/dL 8.3(L) 8.2(L) 6.3(LL)  Hematocrit 34.8 - 46.6 % 28.3(L) 28.0(L) 22.0(L)  Platelets 145 - 400 K/uL 217 205 204     CMP Latest Ref Rng & Units 05/31/2018 05/10/2018 05/01/2018  Glucose 70 - 99 mg/dL 159(H) 180(H) 235(H)  BUN 8 - 23 mg/dL 31(H) 25(H) 26  Creatinine 0.44 - 1.00 mg/dL 1.04(H) 0.84 0.99  Sodium 135 - 145 mmol/L 143 146(H) 142  Potassium 3.5 - 5.1 mmol/L 4.8 4.2 4.4  Chloride 98 - 111 mmol/L 111 119(H) 111(H)  CO2 22 - 32 mmol/L _0 Calcium 8.9 - 10.3 mg/dL 8.3(L) 8.1(L) 8.6  Total Protein 6.5 - 8.1 g/dL 5.3(L) - 5.5(L)  Total Bilirubin 0.3 - 1.2 mg/dL 0.2(L) - 0.2  Alkaline Phos 38 - 126 U/L 57 - 54  AST 15 - 41 U/L 7(L) - 7  ALT 0 - 44 U/L 8 - <6    Diagnosis 07/19/18 Bone Marrow, Aspirate,Biopsy, and Clot - HYPERCELLULAR BONE MARROW FOR AGE WITH TRILINEAGE HEMATOPOIESIS. - SEE COMMENT. PERIPHERAL BLOOD: - NORMOCYTIC-HYPOCHROMIC ANEMIA. - LYMPHOPENIA Diagnosis Note The bone marrow is hypercellular for age with trilineage hematopoiesis and essentially orderly and progressive maturation of all cell lines. Iron stores are abundant with no ring sideroblasts. Correlation with cytogenetic studies is recommended. (BNS:kh 07/22/18). Susanne Greenhouse MD Pathologist, Electronic Signature (Case signed 07/22/2018)D MICROSCOPIC INFORMATION Specimen Clinical Information severe anemia inspite of IV iron therapy [rd] Source Bone Marrow, Aspirate,Biopsy, and Clot Microscopic LAB DATA: CBC performed on 07/19/2018 shows: WBC 5.1 K/ul Neutrophils 79% HB 8.2 g/dl Lymphocytes 10% HCT 28.0 % Monocytes 9% MCV 92.7 fL Eosinophils 1% RDW 15.9 % Basophils 1% PLT 205 K/ul PERIPHERAL BLOOD SMEAR: The red blood cells display mild anisopoikilocytosis with mild polychromasia. The white blood cells are normal in number with no  significant morphologic abnormalities. The platelets are normal in number. BONE MARROW ASPIRATE: Erythroid precursors: Orderly and progressive maturation for the most part Granulocytic precursors: Orderly and progressive maturation Megakaryocytes: Abundant with predominantly normal morphology Lymphocytes/plasma cells: Large aggregates not present. TOUCH PREPARATIONS: A mixture of cell types present. CLOT and BIOPSY: The sections show 50-60% cellularity with a mixture of myeloid cell types. A rare small interstitial and well circumscribed lymphoid aggregate mostly composed of small lymphocytes is seen. IRON STAIN: Iron stains are performed on a bone marrow aspirate smear and section of clot. The controls stained appropriately. Storage Iron: Present Ringed Sideroblasts: Absent ADDITIONAL DATA / TESTING: The specimen was sent for cytogenetic analysis and a separate report will follow. Specimen Table Bone Marrow count performed on 500 cells shows: Blasts: 0% Myeloid 53% Promyelocyts: 0% Myelocytes: 13% Erythroid 32% Metamyelocyts: 0% Bands: 18% Lymphocytes: 11% Neutrophils: 20% Eosinophils: 2% Plasma Cells: 1% Basophils: 0% Monocytes: 3% M:E ratio: 1.66 Gross The specimen is received  in B-Plus fixative and consists of a 14.0 x 12.0 x 2.0 mm aggregate of red-brown clotted blood. The specimen is entirely submitted in cassette 1A. A second specimen is received in B-Plus fixative and consists of a 1.0 cm in length x 0.2 cm in diameter core of tan-brown bone and clotted blood. The specimen is entirely submitted in cassette 1B following decalcification with Immunocal. (KL:ecj 07/19/2018)  RADIOGRAPHIC STUDIES: I have personally reviewed the radiological images as listed and agreed with the findings in the report. No results found.   ASSESSMENT & PLAN: 72 year old caucasian female with Afib, DM, HTN, and arthritis profound iron deficiency anemia of unknown etiology  1. Severe anemia,  component iron deficiency anemia, +FOBT -Ms. Daise appears stable. She continues to receive IV iron (venofer) every 1-2 weeks. She underwent bone marrow biopsy; I reviewed the report with Dr. Burr Medico and discussed with the patient and her daughter in law today. Path reveals hypercellular marrow with trilineage hematopoiesis. Negative for MDS or malignancy.  -Will continue to supplement with po iron daily and IV venofer. Given her persistent anemia, Hgb 8.3, and not much improvement in iron levels, will proceed with weekly iron x4 and f/u in 4 weeks. If her levels improve may space out her infusions in the future.  -I continue to urge her to consider colonoscopy to further evaluate her anemia for possible GI bleeding or other abnormality. She agrees to see GI to discuss the procedure. We will f/u on the previous referral to [facility] that I placed on 06/14/18.   2. HTN, AF, DM -She has a health advocate who obtained discounts for her routine meds  3. Cancer screenings  -She has not had mammogram before, and is asking for referral for one today. I placed order, she will call breast center to schedule this.   4. Right back, "kidney" pain -She does not have s/sx of UTI, I encouraged her to remain hydrated, use heat for pain, and wear supportive shoes at work with prolonged standing.   PLAN -labs and path reviewed -continue lab, po iron daily, and IV venofer weekly x4 -F/u with Dr. Burr Medico in 4 weeks  -f/u on referral to [facility] -mammogram order placed today  Orders Placed This Encounter  Procedures  . MM Digital Screening    Standing Status:   Future    Standing Expiration Date:   07/26/2019    Order Specific Question:   Reason for Exam (SYMPTOM  OR DIAGNOSIS REQUIRED)    Answer:   initian screening mamogram    Order Specific Question:   Preferred imaging location?    Answer:   [facility]   All questions were answered. The patient knows to call the clinic with any problems, questions or  concerns. No barriers to learning was detected.     Alla Feeling, NP 07/26/18

## 2018-07-26 NOTE — Patient Instructions (Signed)
Iron Sucrose injection What is this medicine? IRON SUCROSE (AHY ern SOO krohs) is an iron complex. Iron is used to make healthy red blood cells, which carry oxygen and nutrients throughout the body. This medicine is used to treat iron deficiency anemia in people with chronic kidney disease. This medicine may be used for other purposes; ask your health care provider or pharmacist if you have questions. COMMON BRAND NAME(S): Venofer What should I tell my health care provider before I take this medicine? They need to know if you have any of these conditions: -anemia not caused by low iron levels -heart disease -high levels of iron in the blood -kidney disease -liver disease -an unusual or allergic reaction to iron, other medicines, foods, dyes, or preservatives -pregnant or trying to get pregnant -breast-feeding How should I use this medicine? This medicine is for infusion into a vein. It is given by a health care professional in a hospital or clinic setting. Talk to your pediatrician regarding the use of this medicine in children. While this drug may be prescribed for children as young as 2 years for selected conditions, precautions do apply. Overdosage: If you think you have taken too much of this medicine contact a poison control center or emergency room at once. NOTE: This medicine is only for you. Do not share this medicine with others. What if I miss a dose? It is important not to miss your dose. Call your doctor or health care professional if you are unable to keep an appointment. What may interact with this medicine? Do not take this medicine with any of the following medications: -deferoxamine -dimercaprol -other iron products This medicine may also interact with the following medications: -chloramphenicol -deferasirox This list may not describe all possible interactions. Give your health care provider a list of all the medicines, herbs, non-prescription drugs, or dietary  supplements you use. Also tell them if you smoke, drink alcohol, or use illegal drugs. Some items may interact with your medicine. What should I watch for while using this medicine? Visit your doctor or healthcare professional regularly. Tell your doctor or healthcare professional if your symptoms do not start to get better or if they get worse. You may need blood work done while you are taking this medicine. You may need to follow a special diet. Talk to your doctor. Foods that contain iron include: whole grains/cereals, dried fruits, beans, or peas, leafy green vegetables, and organ meats (liver, kidney). What side effects may I notice from receiving this medicine? Side effects that you should report to your doctor or health care professional as soon as possible: -allergic reactions like skin rash, itching or hives, swelling of the face, lips, or tongue -breathing problems -changes in blood pressure -cough -fast, irregular heartbeat -feeling faint or lightheaded, falls -fever or chills -flushing, sweating, or hot feelings -joint or muscle aches/pains -seizures -swelling of the ankles or feet -unusually weak or tired Side effects that usually do not require medical attention (report to your doctor or health care professional if they continue or are bothersome): -diarrhea -feeling achy -headache -irritation at site where injected -nausea, vomiting -stomach upset -tiredness This list may not describe all possible side effects. Call your doctor for medical advice about side effects. You may report side effects to FDA at 1-800-FDA-1088. Where should I keep my medicine? This drug is given in a hospital or clinic and will not be stored at home. NOTE: This sheet is a summary. It may not cover all possible information. If   you have questions about this medicine, talk to your doctor, pharmacist, or health care provider.  2018 Elsevier/Gold Standard (2011-08-10 17:14:35)  

## 2018-07-29 ENCOUNTER — Telehealth: Payer: Self-pay | Admitting: Hematology

## 2018-07-29 NOTE — Telephone Encounter (Signed)
Scheduled appt per 9/13 los - able to schedule treatments at patient requested time - patient is aware of appts

## 2018-07-31 ENCOUNTER — Inpatient Hospital Stay: Payer: Medicare HMO

## 2018-07-31 ENCOUNTER — Other Ambulatory Visit: Payer: Self-pay | Admitting: Pharmacist

## 2018-07-31 VITALS — BP 117/61 | HR 86 | Temp 98.6°F | Resp 18

## 2018-07-31 DIAGNOSIS — M549 Dorsalgia, unspecified: Secondary | ICD-10-CM | POA: Diagnosis not present

## 2018-07-31 DIAGNOSIS — D509 Iron deficiency anemia, unspecified: Secondary | ICD-10-CM

## 2018-07-31 DIAGNOSIS — I4891 Unspecified atrial fibrillation: Secondary | ICD-10-CM | POA: Diagnosis not present

## 2018-07-31 DIAGNOSIS — E119 Type 2 diabetes mellitus without complications: Secondary | ICD-10-CM | POA: Diagnosis not present

## 2018-07-31 DIAGNOSIS — I10 Essential (primary) hypertension: Secondary | ICD-10-CM | POA: Diagnosis not present

## 2018-07-31 LAB — CBC WITH DIFFERENTIAL (CANCER CENTER ONLY)
BASOS ABS: 0.1 10*3/uL (ref 0.0–0.1)
BASOS PCT: 1 %
Eosinophils Absolute: 0.2 10*3/uL (ref 0.0–0.5)
Eosinophils Relative: 2 %
HCT: 26.2 % — ABNORMAL LOW (ref 34.8–46.6)
HEMOGLOBIN: 8.3 g/dL — AB (ref 11.6–15.9)
Lymphocytes Relative: 9 %
Lymphs Abs: 0.6 10*3/uL — ABNORMAL LOW (ref 0.9–3.3)
MCH: 28.2 pg (ref 25.1–34.0)
MCHC: 31.5 g/dL (ref 31.5–36.0)
MCV: 89.6 fL (ref 79.5–101.0)
MONO ABS: 0.7 10*3/uL (ref 0.1–0.9)
Monocytes Relative: 10 %
NEUTROS ABS: 5.6 10*3/uL (ref 1.5–6.5)
NEUTROS PCT: 78 %
PLATELETS: 212 10*3/uL (ref 145–400)
RBC: 2.92 MIL/uL — AB (ref 3.70–5.45)
RDW: 17.7 % — ABNORMAL HIGH (ref 11.2–14.5)
WBC Count: 7.1 10*3/uL (ref 3.9–10.3)

## 2018-07-31 LAB — IRON AND TIBC
IRON: 34 ug/dL — AB (ref 41–142)
SATURATION RATIOS: 13 % — AB (ref 21–57)
TIBC: 264 ug/dL (ref 236–444)
UIBC: 230 ug/dL

## 2018-07-31 LAB — FERRITIN: FERRITIN: 67 ng/mL (ref 11–307)

## 2018-07-31 MED ORDER — METHYLPREDNISOLONE SODIUM SUCC 125 MG IJ SOLR
125.0000 mg | Freq: Once | INTRAMUSCULAR | Status: AC
  Administered 2018-07-31: 125 mg via INTRAVENOUS

## 2018-07-31 MED ORDER — SODIUM CHLORIDE 0.9 % IV SOLN
INTRAVENOUS | Status: DC
  Administered 2018-07-31: 11:00:00 via INTRAVENOUS
  Filled 2018-07-31: qty 250

## 2018-07-31 MED ORDER — ACETAMINOPHEN 325 MG PO TABS
ORAL_TABLET | ORAL | Status: AC
  Filled 2018-07-31: qty 2

## 2018-07-31 MED ORDER — SODIUM CHLORIDE 0.9 % IV SOLN: 50.0000 mg | Freq: Once | INTRAVENOUS | Status: DC

## 2018-07-31 MED ORDER — METHYLPREDNISOLONE SODIUM SUCC 125 MG IJ SOLR
INTRAMUSCULAR | Status: AC
  Filled 2018-07-31: qty 2

## 2018-07-31 MED ORDER — SODIUM CHLORIDE 0.9 % IV SOLN
200.0000 mg | Freq: Once | INTRAVENOUS | Status: AC
  Administered 2018-07-31: 200 mg via INTRAVENOUS
  Filled 2018-07-31: qty 10

## 2018-07-31 MED ORDER — FAMOTIDINE IN NACL 20-0.9 MG/50ML-% IV SOLN
20.0000 mg | Freq: Once | INTRAVENOUS | Status: AC
  Administered 2018-07-31: 20 mg via INTRAVENOUS

## 2018-07-31 MED ORDER — FAMOTIDINE IN NACL 20-0.9 MG/50ML-% IV SOLN
INTRAVENOUS | Status: AC
  Filled 2018-07-31: qty 50

## 2018-07-31 MED ORDER — ACETAMINOPHEN 325 MG PO TABS
650.0000 mg | ORAL_TABLET | Freq: Once | ORAL | Status: AC
  Administered 2018-07-31: 650 mg via ORAL

## 2018-07-31 MED ORDER — DIPHENHYDRAMINE HCL 50 MG/ML IJ SOLN
INTRAMUSCULAR | Status: AC
  Filled 2018-07-31: qty 1

## 2018-07-31 MED ORDER — DIPHENHYDRAMINE HCL 50 MG/ML IJ SOLN
50.0000 mg | Freq: Once | INTRAMUSCULAR | Status: AC
  Administered 2018-07-31: 50 mg via INTRAVENOUS

## 2018-07-31 NOTE — Patient Outreach (Signed)
[facility]) Care Management  07/31/2018  Kaitlen Redford [facility] Oct 23, 1946 [id number]   Receive an Suanne Marker message from [place] letting me know that she spoke with Annye Rusk and the patient has received her Victoza and Levemir from [employer] patient assistance and Caryl Pina reviewed with patient how to obtain refills from [employer] as well as Roosvelt Harps for [employer].  Will close pharmacy episode at this time.  Harlow Asa, PharmD, [place] Management [phone]

## 2018-07-31 NOTE — Patient Instructions (Signed)
Iron Sucrose injection What is this medicine? IRON SUCROSE (AHY ern SOO krohs) is an iron complex. Iron is used to make healthy red blood cells, which carry oxygen and nutrients throughout the body. This medicine is used to treat iron deficiency anemia in people with chronic kidney disease. This medicine may be used for other purposes; ask your health care provider or pharmacist if you have questions. COMMON BRAND NAME(S): Venofer What should I tell my health care provider before I take this medicine? They need to know if you have any of these conditions: -anemia not caused by low iron levels -heart disease -high levels of iron in the blood -kidney disease -liver disease -an unusual or allergic reaction to iron, other medicines, foods, dyes, or preservatives -pregnant or trying to get pregnant -breast-feeding How should I use this medicine? This medicine is for infusion into a vein. It is given by a health care professional in a hospital or clinic setting. Talk to your pediatrician regarding the use of this medicine in children. While this drug may be prescribed for children as young as 2 years for selected conditions, precautions do apply. Overdosage: If you think you have taken too much of this medicine contact a poison control center or emergency room at once. NOTE: This medicine is only for you. Do not share this medicine with others. What if I miss a dose? It is important not to miss your dose. Call your doctor or health care professional if you are unable to keep an appointment. What may interact with this medicine? Do not take this medicine with any of the following medications: -deferoxamine -dimercaprol -other iron products This medicine may also interact with the following medications: -chloramphenicol -deferasirox This list may not describe all possible interactions. Give your health care provider a list of all the medicines, herbs, non-prescription drugs, or dietary  supplements you use. Also tell them if you smoke, drink alcohol, or use illegal drugs. Some items may interact with your medicine. What should I watch for while using this medicine? Visit your doctor or healthcare professional regularly. Tell your doctor or healthcare professional if your symptoms do not start to get better or if they get worse. You may need blood work done while you are taking this medicine. You may need to follow a special diet. Talk to your doctor. Foods that contain iron include: whole grains/cereals, dried fruits, beans, or peas, leafy green vegetables, and organ meats (liver, kidney). What side effects may I notice from receiving this medicine? Side effects that you should report to your doctor or health care professional as soon as possible: -allergic reactions like skin rash, itching or hives, swelling of the face, lips, or tongue -breathing problems -changes in blood pressure -cough -fast, irregular heartbeat -feeling faint or lightheaded, falls -fever or chills -flushing, sweating, or hot feelings -joint or muscle aches/pains -seizures -swelling of the ankles or feet -unusually weak or tired Side effects that usually do not require medical attention (report to your doctor or health care professional if they continue or are bothersome): -diarrhea -feeling achy -headache -irritation at site where injected -nausea, vomiting -stomach upset -tiredness This list may not describe all possible side effects. Call your doctor for medical advice about side effects. You may report side effects to FDA at 1-800-FDA-1088. Where should I keep my medicine? This drug is given in a hospital or clinic and will not be stored at home. NOTE: This sheet is a summary. It may not cover all possible information. If   you have questions about this medicine, talk to your doctor, pharmacist, or health care provider.  2018 Elsevier/Gold Standard (2011-08-10 17:14:35)  

## 2018-08-01 ENCOUNTER — Ambulatory Visit: Payer: Self-pay | Admitting: Hematology

## 2018-08-01 ENCOUNTER — Other Ambulatory Visit: Payer: Self-pay

## 2018-08-02 ENCOUNTER — Telehealth: Payer: Self-pay | Admitting: Emergency Medicine

## 2018-08-02 DIAGNOSIS — G894 Chronic pain syndrome: Secondary | ICD-10-CM | POA: Diagnosis not present

## 2018-08-02 DIAGNOSIS — Z794 Long term (current) use of insulin: Secondary | ICD-10-CM | POA: Diagnosis not present

## 2018-08-02 DIAGNOSIS — E785 Hyperlipidemia, unspecified: Secondary | ICD-10-CM | POA: Diagnosis not present

## 2018-08-02 DIAGNOSIS — D509 Iron deficiency anemia, unspecified: Secondary | ICD-10-CM | POA: Diagnosis not present

## 2018-08-02 DIAGNOSIS — E1165 Type 2 diabetes mellitus with hyperglycemia: Secondary | ICD-10-CM | POA: Diagnosis not present

## 2018-08-02 DIAGNOSIS — Z79899 Other long term (current) drug therapy: Secondary | ICD-10-CM | POA: Diagnosis not present

## 2018-08-02 NOTE — Telephone Encounter (Addendum)
Detailed VM left  ----- Message from Jonnie Finner, RN sent at 08/02/2018 12:18 PM EDT -----   ----- Message ----- From: Alla Feeling, NP Sent: 08/02/2018   9:29 AM EDT To: [facility]  Please let her know Hgb is steady at 8.3, no blood transfusion at this time. Iron studies are stable. Continue current plan.  Thanks, Regan Rakers NP

## 2018-08-05 ENCOUNTER — Encounter (HOSPITAL_COMMUNITY): Payer: Self-pay | Admitting: Hematology

## 2018-08-07 DIAGNOSIS — H16142 Punctate keratitis, left eye: Secondary | ICD-10-CM | POA: Diagnosis not present

## 2018-08-08 ENCOUNTER — Telehealth: Payer: Self-pay

## 2018-08-08 ENCOUNTER — Other Ambulatory Visit: Payer: Self-pay

## 2018-08-08 DIAGNOSIS — D509 Iron deficiency anemia, unspecified: Secondary | ICD-10-CM

## 2018-08-08 NOTE — Telephone Encounter (Addendum)
Patient called stating has a script from PCP for additional labs to be drawn as she did not want her stuck twice.  They are A1C, TSH and Lipid informed patient to bring script but I will add. She will need to be fasting.

## 2018-08-09 ENCOUNTER — Inpatient Hospital Stay: Payer: Medicare HMO

## 2018-08-09 ENCOUNTER — Other Ambulatory Visit: Payer: Self-pay

## 2018-08-09 ENCOUNTER — Ambulatory Visit: Payer: Self-pay | Admitting: Hematology

## 2018-08-09 VITALS — BP 97/50 | HR 77 | Temp 98.3°F | Resp 18

## 2018-08-09 DIAGNOSIS — D509 Iron deficiency anemia, unspecified: Secondary | ICD-10-CM

## 2018-08-09 DIAGNOSIS — M549 Dorsalgia, unspecified: Secondary | ICD-10-CM | POA: Diagnosis not present

## 2018-08-09 DIAGNOSIS — E119 Type 2 diabetes mellitus without complications: Secondary | ICD-10-CM | POA: Diagnosis not present

## 2018-08-09 DIAGNOSIS — I4891 Unspecified atrial fibrillation: Secondary | ICD-10-CM | POA: Diagnosis not present

## 2018-08-09 DIAGNOSIS — I10 Essential (primary) hypertension: Secondary | ICD-10-CM | POA: Diagnosis not present

## 2018-08-09 LAB — CBC WITH DIFFERENTIAL (CANCER CENTER ONLY)
Basophils Absolute: 0 10*3/uL (ref 0.0–0.1)
Basophils Relative: 1 %
EOS ABS: 0.1 10*3/uL (ref 0.0–0.5)
Eosinophils Relative: 2 %
HCT: 22.7 % — ABNORMAL LOW (ref 34.8–46.6)
Hemoglobin: 7 g/dL — CL (ref 11.6–15.9)
LYMPHS ABS: 0.4 10*3/uL — AB (ref 0.9–3.3)
Lymphocytes Relative: 7 %
MCH: 28.5 pg (ref 25.1–34.0)
MCHC: 30.9 g/dL — ABNORMAL LOW (ref 31.5–36.0)
MCV: 92 fL (ref 79.5–101.0)
MONOS PCT: 10 %
Monocytes Absolute: 0.6 10*3/uL (ref 0.1–0.9)
Neutro Abs: 4.7 10*3/uL (ref 1.5–6.5)
Neutrophils Relative %: 80 %
PLATELETS: 162 10*3/uL (ref 145–400)
RBC: 2.47 MIL/uL — ABNORMAL LOW (ref 3.70–5.45)
RDW: 18.9 % — ABNORMAL HIGH (ref 11.2–14.5)
WBC Count: 5.8 10*3/uL (ref 3.9–10.3)

## 2018-08-09 LAB — HEMOGLOBIN A1C
HEMOGLOBIN A1C: 5.6 % (ref 4.8–5.6)
Mean Plasma Glucose: 114.02 mg/dL

## 2018-08-09 LAB — PREPARE RBC (CROSSMATCH)

## 2018-08-09 LAB — LIPID PANEL
Cholesterol: 125 mg/dL (ref 0–200)
HDL: 30 mg/dL — ABNORMAL LOW (ref >40)
LDL Cholesterol: 68 mg/dL (ref 0–99)
Total CHOL/HDL Ratio: 4.2 RATIO
Triglycerides: 135 mg/dL (ref <150)
VLDL: 27 mg/dL (ref 0–40)

## 2018-08-09 LAB — TSH: TSH: 1.001 u[IU]/mL (ref 0.308–3.960)

## 2018-08-09 MED ORDER — SODIUM CHLORIDE 0.9 % IV SOLN
Freq: Once | INTRAVENOUS | Status: AC
  Administered 2018-08-09: 11:00:00 via INTRAVENOUS
  Filled 2018-08-09: qty 250

## 2018-08-09 MED ORDER — METHYLPREDNISOLONE SODIUM SUCC 125 MG IJ SOLR
125.0000 mg | Freq: Once | INTRAMUSCULAR | Status: AC
  Administered 2018-08-09: 125 mg via INTRAVENOUS

## 2018-08-09 MED ORDER — ACETAMINOPHEN 325 MG PO TABS
ORAL_TABLET | ORAL | Status: AC
  Filled 2018-08-09: qty 2

## 2018-08-09 MED ORDER — DIPHENHYDRAMINE HCL 50 MG/ML IJ SOLN
50.0000 mg | Freq: Once | INTRAMUSCULAR | Status: AC
  Administered 2018-08-09: 50 mg via INTRAVENOUS

## 2018-08-09 MED ORDER — SODIUM CHLORIDE 0.9 % IV SOLN
200.0000 mg | Freq: Once | INTRAVENOUS | Status: AC
  Administered 2018-08-09: 200 mg via INTRAVENOUS
  Filled 2018-08-09: qty 10

## 2018-08-09 MED ORDER — METHYLPREDNISOLONE SODIUM SUCC 125 MG IJ SOLR
INTRAMUSCULAR | Status: AC
  Filled 2018-08-09: qty 2

## 2018-08-09 MED ORDER — ACETAMINOPHEN 325 MG PO TABS
650.0000 mg | ORAL_TABLET | Freq: Once | ORAL | Status: AC
  Administered 2018-08-09: 650 mg via ORAL

## 2018-08-09 MED ORDER — FAMOTIDINE IN NACL 20-0.9 MG/50ML-% IV SOLN
INTRAVENOUS | Status: AC
  Filled 2018-08-09: qty 50

## 2018-08-09 MED ORDER — DIPHENHYDRAMINE HCL 50 MG/ML IJ SOLN
INTRAMUSCULAR | Status: AC
  Filled 2018-08-09: qty 1

## 2018-08-09 MED ORDER — FAMOTIDINE IN NACL 20-0.9 MG/50ML-% IV SOLN
20.0000 mg | Freq: Once | INTRAVENOUS | Status: AC
  Administered 2018-08-09: 20 mg via INTRAVENOUS

## 2018-08-09 NOTE — Patient Instructions (Signed)
Iron Sucrose injection What is this medicine? IRON SUCROSE (AHY ern SOO krohs) is an iron complex. Iron is used to make healthy red blood cells, which carry oxygen and nutrients throughout the body. This medicine is used to treat iron deficiency anemia in people with chronic kidney disease. This medicine may be used for other purposes; ask your health care provider or pharmacist if you have questions. COMMON BRAND NAME(S): Venofer What should I tell my health care provider before I take this medicine? They need to know if you have any of these conditions: -anemia not caused by low iron levels -heart disease -high levels of iron in the blood -kidney disease -liver disease -an unusual or allergic reaction to iron, other medicines, foods, dyes, or preservatives -pregnant or trying to get pregnant -breast-feeding How should I use this medicine? This medicine is for infusion into a vein. It is given by a health care professional in a hospital or clinic setting. Talk to your pediatrician regarding the use of this medicine in children. While this drug may be prescribed for children as young as 2 years for selected conditions, precautions do apply. Overdosage: If you think you have taken too much of this medicine contact a poison control center or emergency room at once. NOTE: This medicine is only for you. Do not share this medicine with others. What if I miss a dose? It is important not to miss your dose. Call your doctor or health care professional if you are unable to keep an appointment. What may interact with this medicine? Do not take this medicine with any of the following medications: -deferoxamine -dimercaprol -other iron products This medicine may also interact with the following medications: -chloramphenicol -deferasirox This list may not describe all possible interactions. Give your health care provider a list of all the medicines, herbs, non-prescription drugs, or dietary  supplements you use. Also tell them if you smoke, drink alcohol, or use illegal drugs. Some items may interact with your medicine. What should I watch for while using this medicine? Visit your doctor or healthcare professional regularly. Tell your doctor or healthcare professional if your symptoms do not start to get better or if they get worse. You may need blood work done while you are taking this medicine. You may need to follow a special diet. Talk to your doctor. Foods that contain iron include: whole grains/cereals, dried fruits, beans, or peas, leafy green vegetables, and organ meats (liver, kidney). What side effects may I notice from receiving this medicine? Side effects that you should report to your doctor or health care professional as soon as possible: -allergic reactions like skin rash, itching or hives, swelling of the face, lips, or tongue -breathing problems -changes in blood pressure -cough -fast, irregular heartbeat -feeling faint or lightheaded, falls -fever or chills -flushing, sweating, or hot feelings -joint or muscle aches/pains -seizures -swelling of the ankles or feet -unusually weak or tired Side effects that usually do not require medical attention (report to your doctor or health care professional if they continue or are bothersome): -diarrhea -feeling achy -headache -irritation at site where injected -nausea, vomiting -stomach upset -tiredness This list may not describe all possible side effects. Call your doctor for medical advice about side effects. You may report side effects to FDA at 1-800-FDA-1088. Where should I keep my medicine? This drug is given in a hospital or clinic and will not be stored at home. NOTE: This sheet is a summary. It may not cover all possible information. If   you have questions about this medicine, talk to your doctor, pharmacist, or health care provider.  2018 Elsevier/Gold Standard (2011-08-10 17:14:35)  

## 2018-08-09 NOTE — Progress Notes (Signed)
Per Dr. Burr Medico okay to treat with Hgb of 7.0.  Pt will return to cancer center tomorrow to have 2 units of PRBC's

## 2018-08-10 ENCOUNTER — Inpatient Hospital Stay: Payer: Medicare HMO

## 2018-08-10 DIAGNOSIS — E119 Type 2 diabetes mellitus without complications: Secondary | ICD-10-CM | POA: Diagnosis not present

## 2018-08-10 DIAGNOSIS — I10 Essential (primary) hypertension: Secondary | ICD-10-CM | POA: Diagnosis not present

## 2018-08-10 DIAGNOSIS — M549 Dorsalgia, unspecified: Secondary | ICD-10-CM | POA: Diagnosis not present

## 2018-08-10 DIAGNOSIS — D509 Iron deficiency anemia, unspecified: Secondary | ICD-10-CM

## 2018-08-10 DIAGNOSIS — I4891 Unspecified atrial fibrillation: Secondary | ICD-10-CM | POA: Diagnosis not present

## 2018-08-10 MED ORDER — DIPHENHYDRAMINE HCL 25 MG PO CAPS
25.0000 mg | ORAL_CAPSULE | Freq: Once | ORAL | Status: AC
  Administered 2018-08-10: 25 mg via ORAL

## 2018-08-10 MED ORDER — SODIUM CHLORIDE 0.9% IV SOLUTION
250.0000 mL | Freq: Once | INTRAVENOUS | Status: AC
  Administered 2018-08-10: 250 mL via INTRAVENOUS
  Filled 2018-08-10: qty 250

## 2018-08-10 MED ORDER — ACETAMINOPHEN 325 MG PO TABS
650.0000 mg | ORAL_TABLET | Freq: Once | ORAL | Status: AC
  Administered 2018-08-10: 650 mg via ORAL

## 2018-08-10 NOTE — Patient Instructions (Signed)

## 2018-08-12 LAB — TYPE AND SCREEN
ABO/RH(D): A POS
Antibody Screen: NEGATIVE
UNIT DIVISION: 0
Unit division: 0

## 2018-08-12 LAB — BPAM RBC
BLOOD PRODUCT EXPIRATION DATE: 201910212359
Blood Product Expiration Date: 201910212359
ISSUE DATE / TIME: 201909280859
ISSUE DATE / TIME: 201909281032
UNIT TYPE AND RH: 6200
Unit Type and Rh: 6200

## 2018-08-12 NOTE — Progress Notes (Signed)
Faxed lab results to patient's PCP Dr. [name]

## 2018-08-15 ENCOUNTER — Other Ambulatory Visit: Payer: Self-pay | Admitting: Hematology

## 2018-08-16 ENCOUNTER — Inpatient Hospital Stay: Payer: Medicare HMO

## 2018-08-16 ENCOUNTER — Inpatient Hospital Stay: Payer: Medicare HMO | Attending: Hematology and Oncology

## 2018-08-16 VITALS — BP 121/74 | HR 97 | Temp 98.5°F | Resp 18

## 2018-08-16 DIAGNOSIS — I4891 Unspecified atrial fibrillation: Secondary | ICD-10-CM | POA: Diagnosis not present

## 2018-08-16 DIAGNOSIS — Z7901 Long term (current) use of anticoagulants: Secondary | ICD-10-CM | POA: Diagnosis not present

## 2018-08-16 DIAGNOSIS — Z794 Long term (current) use of insulin: Secondary | ICD-10-CM | POA: Insufficient documentation

## 2018-08-16 DIAGNOSIS — Z79899 Other long term (current) drug therapy: Secondary | ICD-10-CM | POA: Insufficient documentation

## 2018-08-16 DIAGNOSIS — E119 Type 2 diabetes mellitus without complications: Secondary | ICD-10-CM | POA: Diagnosis not present

## 2018-08-16 DIAGNOSIS — D509 Iron deficiency anemia, unspecified: Secondary | ICD-10-CM

## 2018-08-16 DIAGNOSIS — I10 Essential (primary) hypertension: Secondary | ICD-10-CM | POA: Insufficient documentation

## 2018-08-16 LAB — CBC WITH DIFFERENTIAL (CANCER CENTER ONLY)
Basophils Absolute: 0 10*3/uL (ref 0.0–0.1)
Basophils Relative: 1 %
Eosinophils Absolute: 0.1 10*3/uL (ref 0.0–0.5)
Eosinophils Relative: 2 %
HEMATOCRIT: 30.9 % — AB (ref 34.8–46.6)
Hemoglobin: 9.8 g/dL — ABNORMAL LOW (ref 11.6–15.9)
LYMPHS ABS: 0.6 10*3/uL — AB (ref 0.9–3.3)
LYMPHS PCT: 9 %
MCH: 28.5 pg (ref 25.1–34.0)
MCHC: 31.8 g/dL (ref 31.5–36.0)
MCV: 89.8 fL (ref 79.5–101.0)
MONO ABS: 0.7 10*3/uL (ref 0.1–0.9)
MONOS PCT: 10 %
NEUTROS ABS: 5.1 10*3/uL (ref 1.5–6.5)
Neutrophils Relative %: 78 %
Platelet Count: 199 10*3/uL (ref 145–400)
RBC: 3.45 MIL/uL — ABNORMAL LOW (ref 3.70–5.45)
RDW: 16.5 % — AB (ref 11.2–14.5)
WBC Count: 6.6 10*3/uL (ref 3.9–10.3)

## 2018-08-16 LAB — IRON AND TIBC
Iron: 28 ug/dL — ABNORMAL LOW (ref 41–142)
Saturation Ratios: 11 % — ABNORMAL LOW (ref 21–57)
TIBC: 259 ug/dL (ref 236–444)
UIBC: 230 ug/dL

## 2018-08-16 LAB — FERRITIN: FERRITIN: 75 ng/mL (ref 11–307)

## 2018-08-16 MED ORDER — FAMOTIDINE IN NACL 20-0.9 MG/50ML-% IV SOLN
20.0000 mg | Freq: Once | INTRAVENOUS | Status: AC
  Administered 2018-08-16: 20 mg via INTRAVENOUS

## 2018-08-16 MED ORDER — ACETAMINOPHEN 325 MG PO TABS
ORAL_TABLET | ORAL | Status: AC
  Filled 2018-08-16: qty 2

## 2018-08-16 MED ORDER — SODIUM CHLORIDE 0.9 % IV SOLN
Freq: Once | INTRAVENOUS | Status: AC
  Administered 2018-08-16: 11:00:00 via INTRAVENOUS
  Filled 2018-08-16: qty 250

## 2018-08-16 MED ORDER — FAMOTIDINE IN NACL 20-0.9 MG/50ML-% IV SOLN
INTRAVENOUS | Status: AC
  Filled 2018-08-16: qty 50

## 2018-08-16 MED ORDER — METHYLPREDNISOLONE SODIUM SUCC 125 MG IJ SOLR
INTRAMUSCULAR | Status: AC
  Filled 2018-08-16: qty 2

## 2018-08-16 MED ORDER — METHYLPREDNISOLONE SODIUM SUCC 125 MG IJ SOLR
125.0000 mg | Freq: Once | INTRAMUSCULAR | Status: AC
  Administered 2018-08-16: 125 mg via INTRAVENOUS

## 2018-08-16 MED ORDER — SODIUM CHLORIDE 0.9 % IV SOLN
200.0000 mg | Freq: Once | INTRAVENOUS | Status: AC
  Administered 2018-08-16: 200 mg via INTRAVENOUS
  Filled 2018-08-16: qty 10

## 2018-08-16 MED ORDER — DIPHENHYDRAMINE HCL 50 MG/ML IJ SOLN
50.0000 mg | Freq: Once | INTRAMUSCULAR | Status: AC
  Administered 2018-08-16: 50 mg via INTRAVENOUS

## 2018-08-16 MED ORDER — DIPHENHYDRAMINE HCL 50 MG/ML IJ SOLN
INTRAMUSCULAR | Status: AC
  Filled 2018-08-16: qty 1

## 2018-08-16 MED ORDER — ACETAMINOPHEN 325 MG PO TABS
650.0000 mg | ORAL_TABLET | Freq: Once | ORAL | Status: AC
  Administered 2018-08-16: 650 mg via ORAL

## 2018-08-16 NOTE — Patient Instructions (Signed)
Iron Sucrose injection What is this medicine? IRON SUCROSE (AHY ern SOO krohs) is an iron complex. Iron is used to make healthy red blood cells, which carry oxygen and nutrients throughout the body. This medicine is used to treat iron deficiency anemia in people with chronic kidney disease. This medicine may be used for other purposes; ask your health care provider or pharmacist if you have questions. COMMON BRAND NAME(S): Venofer What should I tell my health care provider before I take this medicine? They need to know if you have any of these conditions: -anemia not caused by low iron levels -heart disease -high levels of iron in the blood -kidney disease -liver disease -an unusual or allergic reaction to iron, other medicines, foods, dyes, or preservatives -pregnant or trying to get pregnant -breast-feeding How should I use this medicine? This medicine is for infusion into a vein. It is given by a health care professional in a hospital or clinic setting. Talk to your pediatrician regarding the use of this medicine in children. While this drug may be prescribed for children as young as 2 years for selected conditions, precautions do apply. Overdosage: If you think you have taken too much of this medicine contact a poison control center or emergency room at once. NOTE: This medicine is only for you. Do not share this medicine with others. What if I miss a dose? It is important not to miss your dose. Call your doctor or health care professional if you are unable to keep an appointment. What may interact with this medicine? Do not take this medicine with any of the following medications: -deferoxamine -dimercaprol -other iron products This medicine may also interact with the following medications: -chloramphenicol -deferasirox This list may not describe all possible interactions. Give your health care provider a list of all the medicines, herbs, non-prescription drugs, or dietary  supplements you use. Also tell them if you smoke, drink alcohol, or use illegal drugs. Some items may interact with your medicine. What should I watch for while using this medicine? Visit your doctor or healthcare professional regularly. Tell your doctor or healthcare professional if your symptoms do not start to get better or if they get worse. You may need blood work done while you are taking this medicine. You may need to follow a special diet. Talk to your doctor. Foods that contain iron include: whole grains/cereals, dried fruits, beans, or peas, leafy green vegetables, and organ meats (liver, kidney). What side effects may I notice from receiving this medicine? Side effects that you should report to your doctor or health care professional as soon as possible: -allergic reactions like skin rash, itching or hives, swelling of the face, lips, or tongue -breathing problems -changes in blood pressure -cough -fast, irregular heartbeat -feeling faint or lightheaded, falls -fever or chills -flushing, sweating, or hot feelings -joint or muscle aches/pains -seizures -swelling of the ankles or feet -unusually weak or tired Side effects that usually do not require medical attention (report to your doctor or health care professional if they continue or are bothersome): -diarrhea -feeling achy -headache -irritation at site where injected -nausea, vomiting -stomach upset -tiredness This list may not describe all possible side effects. Call your doctor for medical advice about side effects. You may report side effects to FDA at 1-800-FDA-1088. Where should I keep my medicine? This drug is given in a hospital or clinic and will not be stored at home. NOTE: This sheet is a summary. It may not cover all possible information. If   you have questions about this medicine, talk to your doctor, pharmacist, or health care provider.  2018 Elsevier/Gold Standard (2011-08-10 17:14:35)  

## 2018-08-20 ENCOUNTER — Telehealth: Payer: Self-pay | Admitting: Emergency Medicine

## 2018-08-20 NOTE — Telephone Encounter (Addendum)
Left detailed VM regarding this note   ----- Message from Alla Feeling, NP sent at 08/20/2018  1:34 PM EDT ----- Please let her know Hgb improved after blood transfusion, ferritin is also improving. Continue lab, f/u, and iron infusion. Will see her later this week.  Thanks, Regan Rakers NP

## 2018-08-21 ENCOUNTER — Telehealth: Payer: Self-pay

## 2018-08-21 DIAGNOSIS — H524 Presbyopia: Secondary | ICD-10-CM | POA: Diagnosis not present

## 2018-08-21 NOTE — Telephone Encounter (Signed)
-----   Message from Alla Feeling, NP sent at 08/20/2018  1:34 PM EDT ----- Please let her know Hgb improved after blood transfusion, ferritin is also improving. Continue lab, f/u, and iron infusion. Will see her later this week.  Thanks, Regan Rakers NP

## 2018-08-21 NOTE — Telephone Encounter (Signed)
Left voice message for patient per Cira Rue NP that her hemoglobin has improved after the blood transfusion, ferritin is also improving. Continue lab, f/u and iron infusion.

## 2018-08-23 ENCOUNTER — Inpatient Hospital Stay: Payer: Medicare HMO

## 2018-08-23 ENCOUNTER — Inpatient Hospital Stay (HOSPITAL_BASED_OUTPATIENT_CLINIC_OR_DEPARTMENT_OTHER): Payer: Medicare HMO | Admitting: Nurse Practitioner

## 2018-08-23 ENCOUNTER — Telehealth: Payer: Self-pay | Admitting: Hematology

## 2018-08-23 ENCOUNTER — Encounter: Payer: Self-pay | Admitting: Nurse Practitioner

## 2018-08-23 VITALS — BP 115/70 | HR 95 | Temp 98.2°F | Resp 18

## 2018-08-23 VITALS — BP 112/54 | HR 81 | Temp 98.5°F | Resp 18 | Ht 63.0 in | Wt 184.8 lb

## 2018-08-23 DIAGNOSIS — I4891 Unspecified atrial fibrillation: Secondary | ICD-10-CM

## 2018-08-23 DIAGNOSIS — D509 Iron deficiency anemia, unspecified: Secondary | ICD-10-CM | POA: Diagnosis not present

## 2018-08-23 DIAGNOSIS — E119 Type 2 diabetes mellitus without complications: Secondary | ICD-10-CM | POA: Diagnosis not present

## 2018-08-23 DIAGNOSIS — I10 Essential (primary) hypertension: Secondary | ICD-10-CM

## 2018-08-23 DIAGNOSIS — Z79899 Other long term (current) drug therapy: Secondary | ICD-10-CM | POA: Diagnosis not present

## 2018-08-23 DIAGNOSIS — Z794 Long term (current) use of insulin: Secondary | ICD-10-CM | POA: Diagnosis not present

## 2018-08-23 DIAGNOSIS — Z7901 Long term (current) use of anticoagulants: Secondary | ICD-10-CM | POA: Diagnosis not present

## 2018-08-23 LAB — CBC WITH DIFFERENTIAL (CANCER CENTER ONLY)
ABS IMMATURE GRANULOCYTES: 0.02 10*3/uL (ref 0.00–0.07)
BASOS PCT: 0 %
Basophils Absolute: 0 10*3/uL (ref 0.0–0.1)
Eosinophils Absolute: 0.1 10*3/uL (ref 0.0–0.5)
Eosinophils Relative: 2 %
HCT: 32.7 % — ABNORMAL LOW (ref 36.0–46.0)
HEMOGLOBIN: 9.6 g/dL — AB (ref 12.0–15.0)
IMMATURE GRANULOCYTES: 0 %
LYMPHS PCT: 8 %
Lymphs Abs: 0.6 10*3/uL — ABNORMAL LOW (ref 0.7–4.0)
MCH: 27.9 pg (ref 26.0–34.0)
MCHC: 29.4 g/dL — ABNORMAL LOW (ref 30.0–36.0)
MCV: 95.1 fL (ref 80.0–100.0)
MONOS PCT: 10 %
Monocytes Absolute: 0.8 10*3/uL (ref 0.1–1.0)
NEUTROS PCT: 80 %
Neutro Abs: 6.4 10*3/uL (ref 1.7–7.7)
PLATELETS: 220 10*3/uL (ref 150–400)
RBC: 3.44 MIL/uL — AB (ref 3.87–5.11)
RDW: 15.5 % (ref 11.5–15.5)
WBC Count: 7.9 10*3/uL (ref 4.0–10.5)
nRBC: 0 % (ref 0.0–0.2)

## 2018-08-23 MED ORDER — METHYLPREDNISOLONE SODIUM SUCC 125 MG IJ SOLR
125.0000 mg | Freq: Once | INTRAMUSCULAR | Status: AC
  Administered 2018-08-23: 125 mg via INTRAVENOUS

## 2018-08-23 MED ORDER — FAMOTIDINE IN NACL 20-0.9 MG/50ML-% IV SOLN
INTRAVENOUS | Status: AC
  Filled 2018-08-23: qty 50

## 2018-08-23 MED ORDER — METHYLPREDNISOLONE SODIUM SUCC 125 MG IJ SOLR
INTRAMUSCULAR | Status: AC
  Filled 2018-08-23: qty 2

## 2018-08-23 MED ORDER — ACETAMINOPHEN 325 MG PO TABS
ORAL_TABLET | ORAL | Status: AC
  Filled 2018-08-23: qty 2

## 2018-08-23 MED ORDER — SODIUM CHLORIDE 0.9 % IV SOLN
200.0000 mg | Freq: Once | INTRAVENOUS | Status: AC
  Administered 2018-08-23: 200 mg via INTRAVENOUS
  Filled 2018-08-23: qty 10

## 2018-08-23 MED ORDER — DIPHENHYDRAMINE HCL 50 MG/ML IJ SOLN
INTRAMUSCULAR | Status: AC
  Filled 2018-08-23: qty 1

## 2018-08-23 MED ORDER — ACETAMINOPHEN 325 MG PO TABS
650.0000 mg | ORAL_TABLET | Freq: Once | ORAL | Status: AC
  Administered 2018-08-23: 650 mg via ORAL

## 2018-08-23 MED ORDER — SODIUM CHLORIDE 0.9 % IV SOLN
Freq: Once | INTRAVENOUS | Status: AC
  Administered 2018-08-23: 13:00:00 via INTRAVENOUS
  Filled 2018-08-23: qty 250

## 2018-08-23 MED ORDER — FAMOTIDINE IN NACL 20-0.9 MG/50ML-% IV SOLN
20.0000 mg | Freq: Once | INTRAVENOUS | Status: AC
  Administered 2018-08-23: 20 mg via INTRAVENOUS

## 2018-08-23 MED ORDER — DIPHENHYDRAMINE HCL 50 MG/ML IJ SOLN
50.0000 mg | Freq: Once | INTRAMUSCULAR | Status: AC
  Administered 2018-08-23: 50 mg via INTRAVENOUS

## 2018-08-23 NOTE — Telephone Encounter (Signed)
Appts scheduled avs/calendar printed per 10/11 los

## 2018-08-23 NOTE — Progress Notes (Signed)
[facility]  Telephone:([phone] Fax:(336) [phone]  Clinic Follow up Note   Patient Care Team: [name], MD as PCP - General (Family Medicine) 08/23/2018  CHIFE COMPLAIN:f/u anemia   CURRENT THERAPY:  1. IV Feraheme on 03/21/2018 and 05/10/2018, due to severe reaction during second infusion, she was switched to iron sucrose on 06/14/2018 every 1-2 weeks;   2. venofer 8/2, 8/9, 8/23, 9/6, 9/13, 9/18, 9/27, 10/4;  3. RBCs PRN: 8/24 x2 units (hgb 6.3), 9/28 x2 units (hgb 7.0)  INTERVAL HISTORY: Cassandra Allen returns for follow up and iron as scheduled. Continues to get weekly venofer. She was taking oral iron twice daily but developed loose stool, so she cut back to once daily. Stools have not changed. Denies bloody or black stool. She has gas. Denies n/v. She has f/u with GI scheduled on 10/23, no colonoscopy is scheduled yet. She denies increased fatigue, headache, palpitations. Denies fever, chills, cough, chest pain, dyspnea, or leg edema. She fell at home 2 weeks ago as she was reaching for her walker that was not locked in place. Denies lightheadedness or dizziness. She had a nosebleed, did not seek medical attention. No residual facial pain.    MEDICAL HISTORY:  Past Medical History:  Diagnosis Date  . Arthritis   . Diabetes mellitus without complication ([place])   . Hypertension     SURGICAL HISTORY: History reviewed. No pertinent surgical history.  I have reviewed the social history and family history with the patient and they are unchanged from previous note.  ALLERGIES:  is allergic to metformin and related and prednisone.  MEDICATIONS:  Current Outpatient Medications  Medication Sig Dispense Refill  . apixaban (ELIQUIS) 5 MG TABS tablet TAKE 1 TABLET (5 MG TOTAL) BY MOUTH 2 (TWO) TIMES DAILY. 180 tablet 0  . beta carotene w/minerals (OCUVITE) tablet Take 1 tablet by mouth daily at 12 noon.    . cholecalciferol (VITAMIN D) 1000 units tablet  Take 1,000 Units by mouth at bedtime.    Marland Kitchen diltiazem (CARDIZEM CD) 120 MG 24 hr capsule Take 1 capsule (120 mg total) by mouth 2 (two) times daily. 180 capsule 3  . ferrous sulfate 325 (65 FE) MG EC tablet Take 325 mg by mouth 2 (two) times daily.    Marland Kitchen HYDROcodone-acetaminophen (NORCO) 7.5-325 MG tablet Take 1 tablet by mouth 2 (two) times daily as needed (for pain.).     . Insulin Detemir (LEVEMIR FLEXTOUCH) 100 UNIT/ML Pen Inject 10 Units into the skin daily.    Marland Kitchen liraglutide (VICTOZA) 18 MG/3ML SOPN Inject 1.8 mg into the skin every morning.    Marland Kitchen losartan-hydrochlorothiazide (HYZAAR) 100-12.5 MG tablet Take 1 tablet by mouth daily.     . Multiple Vitamin (MULTIVITAMIN WITH MINERALS) TABS tablet Take 2 tablets by mouth daily at 12 noon.     Marland Kitchen oxybutynin (DITROPAN) 5 MG tablet Take 20 mg by mouth daily.    Marland Kitchen oxybutynin (OXYTROL) 3.9 MG/24HR Place 1 patch onto the skin 2 (two) times a week.    . sertraline (ZOLOFT) 100 MG tablet Take 100 mg by mouth daily at 12 noon.     . simethicone (MYLICON) 80 MG chewable tablet Chew 80-160 mg by mouth 2 (two) times daily as needed for flatulence.     . simvastatin (ZOCOR) 20 MG tablet Take 20 mg by mouth daily at 10 pm.      Current Facility-Administered Medications  Medication Dose Route Frequency Provider Last Rate Last Dose  . 0.9 %  sodium chloride infusion   Intravenous Once Alla Feeling, NP 500 mL/hr at 08/23/18 1253     Facility-Administered Medications Ordered in Other Visits  Medication Dose Route Frequency Provider Last Rate Last Dose  . famotidine (PEPCID) IVPB 20 mg premix  20 mg Intravenous Once Truitt Merle, MD 200 mL/hr at 08/23/18 1308 20 mg at 08/23/18 1308  . iron sucrose (VENOFER) 200 mg in sodium chloride 0.9 % 150 mL IVPB  200 mg Intravenous Once Truitt Merle, MD        PHYSICAL EXAMINATION: ECOG PERFORMANCE STATUS: 0 - Asymptomatic  Vitals:   08/23/18 1117  BP: (!) 112/54  Pulse: 81  Resp: 18  Temp: 98.5 F (36.9 C)  SpO2:  100%   Filed Weights   08/23/18 1117  Weight: 184 lb 12.8 oz (83.8 kg)    GENERAL:alert, no distress and comfortable SKIN:  no rashes or significant lesions EYES:  sclera clear OROPHARYNX:no thrush or ulcers LYMPH:  no palpable cervical or supraclavicular lymphadenopathy LUNGS: clear to auscultation with normal breathing effort HEART: regular rate & rhythm, no lower extremity edema ABDOMEN:abdomen soft, non-tender and normal bowel sounds Musculoskeletal:no cyanosis of digits and no clubbing  NEURO: alert & oriented x 3 with fluent speech, no focal motor/sensory deficits  LABORATORY DATA:  I have reviewed the data as listed CBC Latest Ref Rng & Units 08/23/2018 08/16/2018 08/09/2018  WBC 4.0 - 10.5 K/uL 7.9 6.6 5.8  Hemoglobin 12.0 - 15.0 g/dL 9.6(L) 9.8(L) 7.0(LL)  Hematocrit 36.0 - 46.0 % 32.7(L) 30.9(L) 22.7(L)  Platelets 150 - 400 K/uL 220 199 162     CMP Latest Ref Rng & Units 05/31/2018 05/10/2018 05/01/2018  Glucose 70 - 99 mg/dL 159(H) 180(H) 235(H)  BUN 8 - 23 mg/dL 31(H) 25(H) 26  Creatinine 0.44 - 1.00 mg/dL 1.04(H) 0.84 0.99  Sodium 135 - 145 mmol/L 143 146(H) 142  Potassium 3.5 - 5.1 mmol/L 4.8 4.2 4.4  Chloride 98 - 111 mmol/L 111 119(H) 111(H)  CO2 22 - 32 mmol/L 26 22 24   Calcium 8.9 - 10.3 mg/dL 8.3(L) 8.1(L) 8.6  Total Protein 6.5 - 8.1 g/dL 5.3(L) - 5.5(L)  Total Bilirubin 0.3 - 1.2 mg/dL 0.2(L) - 0.2  Alkaline Phos 38 - 126 U/L 57 - 54  AST 15 - 41 U/L 7(L) - 7  ALT 0 - 44 U/L 8 - <6      RADIOGRAPHIC STUDIES: I have personally reviewed the radiological images as listed and agreed with the findings in the report. No results found.   ASSESSMENT & PLAN: 72 year old caucasian female with Afib, DM, HTN, and arthritis profound iron deficiency anemia of unknown etiology  1.Severe anemia, component iron deficiency anemia, +FOBT -Cassandra Allen appears stable. She continues to receive IV iron (venofer) every 1-2 weeks.  - previously discussed bone marrow  biopsy path reveals hypercellular marrow with trilineage hematopoiesis. Negative for MDS or malignancy.  -Will continue to supplement with po iron daily and IV venofer. She has required RBC transfusion twice for Hgb 6.3 in 06/2018 and 7 in 07/2018. -Given her persistent anemia and not much improvement in iron levels, will proceed with weekly iron x4 and f/u in 4 weeks. If her levels improve may space out her infusions in the future. Hgb 9.6 today  -I continue to urge her to consider colonoscopy to further evaluate her anemia for possible GI bleeding or other abnormality. She has GI appt on 10/23 and plans to schedule colonoscopy after consult. -will continue lab  and weekly venofer, will f/u in 1 month after GI visit and colonoscopy    2. HTN, AF, DM -She has a health advocate who obtained discounts for her routine meds -BP is slightly low today, will support with 500 cc NS; I encouraged her to remain adequately hydrated.   3. Cancer screenings  -She has not had mammogram before, and is asking for referral for one today. I placed order, she will call breast center to schedule this.  -she still has not scheduled, but is on her to-do list; she had eye exam lately and is gradually getting recommended tests done   4. Right back, "kidney" pain -denies pain today  PLAN -lab, venofer weekly x4 -500 cc NS over 1 hour today with Venofer for low BP  -F/u with Dr. Burr Medico in 4 weeks, after GI and colonoscopy appts  -Continue oral iron therapy, tolerating 1 tab daily currently  -patient to schedule mammogram   All questions were answered. The patient knows to call the clinic with any problems, questions or concerns. No barriers to learning was detected.     Alla Feeling, NP 08/23/18

## 2018-08-23 NOTE — Patient Instructions (Signed)
Iron Sucrose injection What is this medicine? IRON SUCROSE (AHY ern SOO krohs) is an iron complex. Iron is used to make healthy red blood cells, which carry oxygen and nutrients throughout the body. This medicine is used to treat iron deficiency anemia in people with chronic kidney disease. This medicine may be used for other purposes; ask your health care provider or pharmacist if you have questions. COMMON BRAND NAME(S): Venofer What should I tell my health care provider before I take this medicine? They need to know if you have any of these conditions: -anemia not caused by low iron levels -heart disease -high levels of iron in the blood -kidney disease -liver disease -an unusual or allergic reaction to iron, other medicines, foods, dyes, or preservatives -pregnant or trying to get pregnant -breast-feeding How should I use this medicine? This medicine is for infusion into a vein. It is given by a health care professional in a hospital or clinic setting. Talk to your pediatrician regarding the use of this medicine in children. While this drug may be prescribed for children as young as 2 years for selected conditions, precautions do apply. Overdosage: If you think you have taken too much of this medicine contact a poison control center or emergency room at once. NOTE: This medicine is only for you. Do not share this medicine with others. What if I miss a dose? It is important not to miss your dose. Call your doctor or health care professional if you are unable to keep an appointment. What may interact with this medicine? Do not take this medicine with any of the following medications: -deferoxamine -dimercaprol -other iron products This medicine may also interact with the following medications: -chloramphenicol -deferasirox This list may not describe all possible interactions. Give your health care provider a list of all the medicines, herbs, non-prescription drugs, or dietary  supplements you use. Also tell them if you smoke, drink alcohol, or use illegal drugs. Some items may interact with your medicine. What should I watch for while using this medicine? Visit your doctor or healthcare professional regularly. Tell your doctor or healthcare professional if your symptoms do not start to get better or if they get worse. You may need blood work done while you are taking this medicine. You may need to follow a special diet. Talk to your doctor. Foods that contain iron include: whole grains/cereals, dried fruits, beans, or peas, leafy green vegetables, and organ meats (liver, kidney). What side effects may I notice from receiving this medicine? Side effects that you should report to your doctor or health care professional as soon as possible: -allergic reactions like skin rash, itching or hives, swelling of the face, lips, or tongue -breathing problems -changes in blood pressure -cough -fast, irregular heartbeat -feeling faint or lightheaded, falls -fever or chills -flushing, sweating, or hot feelings -joint or muscle aches/pains -seizures -swelling of the ankles or feet -unusually weak or tired Side effects that usually do not require medical attention (report to your doctor or health care professional if they continue or are bothersome): -diarrhea -feeling achy -headache -irritation at site where injected -nausea, vomiting -stomach upset -tiredness This list may not describe all possible side effects. Call your doctor for medical advice about side effects. You may report side effects to FDA at 1-800-FDA-1088. Where should I keep my medicine? This drug is given in a hospital or clinic and will not be stored at home. NOTE: This sheet is a summary. It may not cover all possible information. If   you have questions about this medicine, talk to your doctor, pharmacist, or health care provider.  2018 Elsevier/Gold Standard (2011-08-10 17:14:35)  

## 2018-08-30 ENCOUNTER — Inpatient Hospital Stay: Payer: Medicare HMO

## 2018-08-30 VITALS — BP 111/58 | HR 74 | Temp 98.1°F | Resp 18

## 2018-08-30 DIAGNOSIS — I4891 Unspecified atrial fibrillation: Secondary | ICD-10-CM | POA: Diagnosis not present

## 2018-08-30 DIAGNOSIS — E119 Type 2 diabetes mellitus without complications: Secondary | ICD-10-CM | POA: Diagnosis not present

## 2018-08-30 DIAGNOSIS — Z794 Long term (current) use of insulin: Secondary | ICD-10-CM | POA: Diagnosis not present

## 2018-08-30 DIAGNOSIS — D509 Iron deficiency anemia, unspecified: Secondary | ICD-10-CM

## 2018-08-30 DIAGNOSIS — Z79899 Other long term (current) drug therapy: Secondary | ICD-10-CM | POA: Diagnosis not present

## 2018-08-30 DIAGNOSIS — I10 Essential (primary) hypertension: Secondary | ICD-10-CM | POA: Diagnosis not present

## 2018-08-30 DIAGNOSIS — Z7901 Long term (current) use of anticoagulants: Secondary | ICD-10-CM | POA: Diagnosis not present

## 2018-08-30 LAB — CBC WITH DIFFERENTIAL (CANCER CENTER ONLY)
Abs Immature Granulocytes: 0.04 10*3/uL (ref 0.00–0.07)
Basophils Absolute: 0 10*3/uL (ref 0.0–0.1)
Basophils Relative: 0 %
EOS PCT: 1 %
Eosinophils Absolute: 0.1 10*3/uL (ref 0.0–0.5)
HCT: 25.3 % — ABNORMAL LOW (ref 36.0–46.0)
Hemoglobin: 7.3 g/dL — ABNORMAL LOW (ref 12.0–15.0)
Immature Granulocytes: 1 %
LYMPHS PCT: 6 %
Lymphs Abs: 0.5 10*3/uL — ABNORMAL LOW (ref 0.7–4.0)
MCH: 28.2 pg (ref 26.0–34.0)
MCHC: 28.9 g/dL — ABNORMAL LOW (ref 30.0–36.0)
MCV: 97.7 fL (ref 80.0–100.0)
MONO ABS: 0.8 10*3/uL (ref 0.1–1.0)
Monocytes Relative: 9 %
Neutro Abs: 7.2 10*3/uL (ref 1.7–7.7)
Neutrophils Relative %: 83 %
Platelet Count: 192 10*3/uL (ref 150–400)
RBC: 2.59 MIL/uL — ABNORMAL LOW (ref 3.87–5.11)
RDW: 16.2 % — ABNORMAL HIGH (ref 11.5–15.5)
WBC: 8.7 10*3/uL (ref 4.0–10.5)
nRBC: 0 % (ref 0.0–0.2)

## 2018-08-30 LAB — IRON AND TIBC
IRON: 35 ug/dL — AB (ref 41–142)
SATURATION RATIOS: 16 % — AB (ref 21–57)
TIBC: 213 ug/dL — AB (ref 236–444)
UIBC: 178 ug/dL

## 2018-08-30 LAB — FERRITIN: Ferritin: 162 ng/mL (ref 11–307)

## 2018-08-30 MED ORDER — DIPHENHYDRAMINE HCL 50 MG/ML IJ SOLN
INTRAMUSCULAR | Status: AC
  Filled 2018-08-30: qty 1

## 2018-08-30 MED ORDER — DIPHENHYDRAMINE HCL 50 MG/ML IJ SOLN
50.0000 mg | Freq: Once | INTRAMUSCULAR | Status: AC
  Administered 2018-08-30: 50 mg via INTRAVENOUS

## 2018-08-30 MED ORDER — METHYLPREDNISOLONE SODIUM SUCC 125 MG IJ SOLR
INTRAMUSCULAR | Status: AC
  Filled 2018-08-30: qty 2

## 2018-08-30 MED ORDER — FAMOTIDINE IN NACL 20-0.9 MG/50ML-% IV SOLN
20.0000 mg | Freq: Once | INTRAVENOUS | Status: AC
  Administered 2018-08-30: 20 mg via INTRAVENOUS

## 2018-08-30 MED ORDER — ACETAMINOPHEN 325 MG PO TABS
650.0000 mg | ORAL_TABLET | Freq: Once | ORAL | Status: AC
  Administered 2018-08-30: 650 mg via ORAL

## 2018-08-30 MED ORDER — METHYLPREDNISOLONE SODIUM SUCC 125 MG IJ SOLR
125.0000 mg | Freq: Once | INTRAMUSCULAR | Status: AC
  Administered 2018-08-30: 125 mg via INTRAVENOUS

## 2018-08-30 MED ORDER — SODIUM CHLORIDE 0.9 % IV SOLN
INTRAVENOUS | Status: DC
  Administered 2018-08-30: 15:00:00 via INTRAVENOUS
  Filled 2018-08-30 (×2): qty 250

## 2018-08-30 MED ORDER — ACETAMINOPHEN 325 MG PO TABS
ORAL_TABLET | ORAL | Status: AC
  Filled 2018-08-30: qty 2

## 2018-08-30 MED ORDER — FAMOTIDINE IN NACL 20-0.9 MG/50ML-% IV SOLN
INTRAVENOUS | Status: AC
  Filled 2018-08-30: qty 50

## 2018-08-30 MED ORDER — SODIUM CHLORIDE 0.9 % IV SOLN
200.0000 mg | Freq: Once | INTRAVENOUS | Status: AC
  Administered 2018-08-30: 200 mg via INTRAVENOUS
  Filled 2018-08-30: qty 10

## 2018-08-30 NOTE — Patient Instructions (Signed)
Iron Sucrose injection What is this medicine? IRON SUCROSE (AHY ern SOO krohs) is an iron complex. Iron is used to make healthy red blood cells, which carry oxygen and nutrients throughout the body. This medicine is used to treat iron deficiency anemia in people with chronic kidney disease. This medicine may be used for other purposes; ask your health care provider or pharmacist if you have questions. COMMON BRAND NAME(S): Venofer What should I tell my health care provider before I take this medicine? They need to know if you have any of these conditions: -anemia not caused by low iron levels -heart disease -high levels of iron in the blood -kidney disease -liver disease -an unusual or allergic reaction to iron, other medicines, foods, dyes, or preservatives -pregnant or trying to get pregnant -breast-feeding How should I use this medicine? This medicine is for infusion into a vein. It is given by a health care professional in a hospital or clinic setting. Talk to your pediatrician regarding the use of this medicine in children. While this drug may be prescribed for children as young as 2 years for selected conditions, precautions do apply. Overdosage: If you think you have taken too much of this medicine contact a poison control center or emergency room at once. NOTE: This medicine is only for you. Do not share this medicine with others. What if I miss a dose? It is important not to miss your dose. Call your doctor or health care professional if you are unable to keep an appointment. What may interact with this medicine? Do not take this medicine with any of the following medications: -deferoxamine -dimercaprol -other iron products This medicine may also interact with the following medications: -chloramphenicol -deferasirox This list may not describe all possible interactions. Give your health care provider a list of all the medicines, herbs, non-prescription drugs, or dietary  supplements you use. Also tell them if you smoke, drink alcohol, or use illegal drugs. Some items may interact with your medicine. What should I watch for while using this medicine? Visit your doctor or healthcare professional regularly. Tell your doctor or healthcare professional if your symptoms do not start to get better or if they get worse. You may need blood work done while you are taking this medicine. You may need to follow a special diet. Talk to your doctor. Foods that contain iron include: whole grains/cereals, dried fruits, beans, or peas, leafy green vegetables, and organ meats (liver, kidney). What side effects may I notice from receiving this medicine? Side effects that you should report to your doctor or health care professional as soon as possible: -allergic reactions like skin rash, itching or hives, swelling of the face, lips, or tongue -breathing problems -changes in blood pressure -cough -fast, irregular heartbeat -feeling faint or lightheaded, falls -fever or chills -flushing, sweating, or hot feelings -joint or muscle aches/pains -seizures -swelling of the ankles or feet -unusually weak or tired Side effects that usually do not require medical attention (report to your doctor or health care professional if they continue or are bothersome): -diarrhea -feeling achy -headache -irritation at site where injected -nausea, vomiting -stomach upset -tiredness This list may not describe all possible side effects. Call your doctor for medical advice about side effects. You may report side effects to FDA at 1-800-FDA-1088. Where should I keep my medicine? This drug is given in a hospital or clinic and will not be stored at home. NOTE: This sheet is a summary. It may not cover all possible information. If   you have questions about this medicine, talk to your doctor, pharmacist, or health care provider.  2018 Elsevier/Gold Standard (2011-08-10 17:14:35)  

## 2018-09-04 DIAGNOSIS — D509 Iron deficiency anemia, unspecified: Secondary | ICD-10-CM | POA: Diagnosis not present

## 2018-09-05 ENCOUNTER — Other Ambulatory Visit: Payer: Self-pay | Admitting: Nurse Practitioner

## 2018-09-05 ENCOUNTER — Ambulatory Visit: Payer: Self-pay

## 2018-09-05 ENCOUNTER — Other Ambulatory Visit: Payer: Self-pay

## 2018-09-05 ENCOUNTER — Telehealth: Payer: Self-pay

## 2018-09-05 DIAGNOSIS — D509 Iron deficiency anemia, unspecified: Secondary | ICD-10-CM

## 2018-09-05 MED ORDER — DIPHENOXYLATE-ATROPINE 2.5-0.025 MG PO TABS: ORAL_TABLET | ORAL | 0 refills | Status: DC

## 2018-09-05 NOTE — Telephone Encounter (Signed)
Patient calls requesting something be called in for loose stools to her pharmacy.

## 2018-09-06 ENCOUNTER — Inpatient Hospital Stay: Payer: Medicare HMO

## 2018-09-06 ENCOUNTER — Other Ambulatory Visit: Payer: Self-pay

## 2018-09-06 ENCOUNTER — Telehealth: Payer: Self-pay | Admitting: Hematology

## 2018-09-06 VITALS — BP 104/49 | HR 70 | Temp 98.3°F | Resp 16

## 2018-09-06 DIAGNOSIS — Z7901 Long term (current) use of anticoagulants: Secondary | ICD-10-CM | POA: Diagnosis not present

## 2018-09-06 DIAGNOSIS — D509 Iron deficiency anemia, unspecified: Secondary | ICD-10-CM

## 2018-09-06 DIAGNOSIS — Z794 Long term (current) use of insulin: Secondary | ICD-10-CM | POA: Diagnosis not present

## 2018-09-06 DIAGNOSIS — Z79899 Other long term (current) drug therapy: Secondary | ICD-10-CM | POA: Diagnosis not present

## 2018-09-06 DIAGNOSIS — I4891 Unspecified atrial fibrillation: Secondary | ICD-10-CM | POA: Diagnosis not present

## 2018-09-06 DIAGNOSIS — E119 Type 2 diabetes mellitus without complications: Secondary | ICD-10-CM | POA: Diagnosis not present

## 2018-09-06 DIAGNOSIS — I10 Essential (primary) hypertension: Secondary | ICD-10-CM | POA: Diagnosis not present

## 2018-09-06 LAB — PREPARE RBC (CROSSMATCH)

## 2018-09-06 MED ORDER — SODIUM CHLORIDE 0.9 % IV SOLN
200.0000 mg | Freq: Once | INTRAVENOUS | Status: AC
  Administered 2018-09-06: 200 mg via INTRAVENOUS
  Filled 2018-09-06: qty 10

## 2018-09-06 MED ORDER — DIPHENHYDRAMINE HCL 50 MG/ML IJ SOLN
INTRAMUSCULAR | Status: AC
  Filled 2018-09-06: qty 1

## 2018-09-06 MED ORDER — FAMOTIDINE IN NACL 20-0.9 MG/50ML-% IV SOLN
20.0000 mg | Freq: Once | INTRAVENOUS | Status: AC
  Administered 2018-09-06: 20 mg via INTRAVENOUS

## 2018-09-06 MED ORDER — METHYLPREDNISOLONE SODIUM SUCC 125 MG IJ SOLR
125.0000 mg | Freq: Once | INTRAMUSCULAR | Status: AC
  Administered 2018-09-06: 125 mg via INTRAVENOUS

## 2018-09-06 MED ORDER — DIPHENHYDRAMINE HCL 50 MG/ML IJ SOLN
50.0000 mg | Freq: Once | INTRAMUSCULAR | Status: AC
  Administered 2018-09-06: 50 mg via INTRAVENOUS

## 2018-09-06 MED ORDER — ACETAMINOPHEN 325 MG PO TABS
650.0000 mg | ORAL_TABLET | Freq: Once | ORAL | Status: AC
  Administered 2018-09-06: 650 mg via ORAL

## 2018-09-06 MED ORDER — ACETAMINOPHEN 325 MG PO TABS
ORAL_TABLET | ORAL | Status: AC
  Filled 2018-09-06: qty 2

## 2018-09-06 MED ORDER — METHYLPREDNISOLONE SODIUM SUCC 125 MG IJ SOLR
INTRAMUSCULAR | Status: AC
  Filled 2018-09-06: qty 2

## 2018-09-06 MED ORDER — FAMOTIDINE IN NACL 20-0.9 MG/50ML-% IV SOLN
INTRAVENOUS | Status: AC
  Filled 2018-09-06: qty 50

## 2018-09-06 NOTE — Patient Instructions (Signed)
Iron Sucrose injection What is this medicine? IRON SUCROSE (AHY ern SOO krohs) is an iron complex. Iron is used to make healthy red blood cells, which carry oxygen and nutrients throughout the body. This medicine is used to treat iron deficiency anemia in people with chronic kidney disease. This medicine may be used for other purposes; ask your health care provider or pharmacist if you have questions. COMMON BRAND NAME(S): Venofer What should I tell my health care provider before I take this medicine? They need to know if you have any of these conditions: -anemia not caused by low iron levels -heart disease -high levels of iron in the blood -kidney disease -liver disease -an unusual or allergic reaction to iron, other medicines, foods, dyes, or preservatives -pregnant or trying to get pregnant -breast-feeding How should I use this medicine? This medicine is for infusion into a vein. It is given by a health care professional in a hospital or clinic setting. Talk to your pediatrician regarding the use of this medicine in children. While this drug may be prescribed for children as Rovena Hearld as 2 years for selected conditions, precautions do apply. Overdosage: If you think you have taken too much of this medicine contact a poison control center or emergency room at once. NOTE: This medicine is only for you. Do not share this medicine with others. What if I miss a dose? It is important not to miss your dose. Call your doctor or health care professional if you are unable to keep an appointment. What may interact with this medicine? Do not take this medicine with any of the following medications: -deferoxamine -dimercaprol -other iron products This medicine may also interact with the following medications: -chloramphenicol -deferasirox This list may not describe all possible interactions. Give your health care provider a list of all the medicines, herbs, non-prescription drugs, or dietary  supplements you use. Also tell them if you smoke, drink alcohol, or use illegal drugs. Some items may interact with your medicine. What should I watch for while using this medicine? Visit your doctor or healthcare professional regularly. Tell your doctor or healthcare professional if your symptoms do not start to get better or if they get worse. You may need blood work done while you are taking this medicine. You may need to follow a special diet. Talk to your doctor. Foods that contain iron include: whole grains/cereals, dried fruits, beans, or peas, leafy green vegetables, and organ meats (liver, kidney). What side effects may I notice from receiving this medicine? Side effects that you should report to your doctor or health care professional as soon as possible: -allergic reactions like skin rash, itching or hives, swelling of the face, lips, or tongue -breathing problems -changes in blood pressure -cough -fast, irregular heartbeat -feeling faint or lightheaded, falls -fever or chills -flushing, sweating, or hot feelings -joint or muscle aches/pains -seizures -swelling of the ankles or feet -unusually weak or tired Side effects that usually do not require medical attention (report to your doctor or health care professional if they continue or are bothersome): -diarrhea -feeling achy -headache -irritation at site where injected -nausea, vomiting -stomach upset -tiredness This list may not describe all possible side effects. Call your doctor for medical advice about side effects. You may report side effects to FDA at 1-800-FDA-1088. Where should I keep my medicine? This drug is given in a hospital or clinic and will not be stored at home. NOTE: This sheet is a summary. It may not cover all possible information. If   you have questions about this medicine, talk to your doctor, pharmacist, or health care provider.  2018 Elsevier/Gold Standard (2011-08-10 17:14:35)  

## 2018-09-06 NOTE — Telephone Encounter (Signed)
LVM for pt regarding appts per 10/23 sch message

## 2018-09-06 NOTE — Progress Notes (Signed)
Ty!

## 2018-09-07 ENCOUNTER — Inpatient Hospital Stay: Payer: Medicare HMO

## 2018-09-07 DIAGNOSIS — Z79899 Other long term (current) drug therapy: Secondary | ICD-10-CM | POA: Diagnosis not present

## 2018-09-07 DIAGNOSIS — E119 Type 2 diabetes mellitus without complications: Secondary | ICD-10-CM | POA: Diagnosis not present

## 2018-09-07 DIAGNOSIS — I10 Essential (primary) hypertension: Secondary | ICD-10-CM | POA: Diagnosis not present

## 2018-09-07 DIAGNOSIS — D509 Iron deficiency anemia, unspecified: Secondary | ICD-10-CM | POA: Diagnosis not present

## 2018-09-07 DIAGNOSIS — I4891 Unspecified atrial fibrillation: Secondary | ICD-10-CM | POA: Diagnosis not present

## 2018-09-07 DIAGNOSIS — Z7901 Long term (current) use of anticoagulants: Secondary | ICD-10-CM | POA: Diagnosis not present

## 2018-09-07 DIAGNOSIS — Z794 Long term (current) use of insulin: Secondary | ICD-10-CM | POA: Diagnosis not present

## 2018-09-07 MED ORDER — SODIUM CHLORIDE 0.9% FLUSH
3.0000 mL | INTRAVENOUS | Status: DC | PRN
  Filled 2018-09-07: qty 10

## 2018-09-07 MED ORDER — HEPARIN SOD (PORK) LOCK FLUSH 100 UNIT/ML IV SOLN
500.0000 [IU] | Freq: Every day | INTRAVENOUS | Status: DC | PRN
  Filled 2018-09-07: qty 5

## 2018-09-07 MED ORDER — ACETAMINOPHEN 325 MG PO TABS
650.0000 mg | ORAL_TABLET | Freq: Once | ORAL | Status: AC
  Administered 2018-09-07: 650 mg via ORAL

## 2018-09-07 MED ORDER — HEPARIN SOD (PORK) LOCK FLUSH 100 UNIT/ML IV SOLN
250.0000 [IU] | INTRAVENOUS | Status: DC | PRN
  Filled 2018-09-07: qty 5

## 2018-09-07 MED ORDER — ACETAMINOPHEN 325 MG PO TABS
ORAL_TABLET | ORAL | Status: AC
  Filled 2018-09-07: qty 2

## 2018-09-07 MED ORDER — SODIUM CHLORIDE 0.9% IV SOLUTION
250.0000 mL | Freq: Once | INTRAVENOUS | Status: AC
  Administered 2018-09-07: 250 mL via INTRAVENOUS
  Filled 2018-09-07: qty 250

## 2018-09-07 MED ORDER — DIPHENHYDRAMINE HCL 25 MG PO CAPS
ORAL_CAPSULE | ORAL | Status: AC
  Filled 2018-09-07: qty 1

## 2018-09-07 MED ORDER — DIPHENHYDRAMINE HCL 25 MG PO CAPS
25.0000 mg | ORAL_CAPSULE | Freq: Once | ORAL | Status: AC
  Administered 2018-09-07: 25 mg via ORAL

## 2018-09-07 MED ORDER — SODIUM CHLORIDE 0.9% FLUSH
10.0000 mL | INTRAVENOUS | Status: DC | PRN
  Filled 2018-09-07: qty 10

## 2018-09-07 NOTE — Patient Instructions (Signed)

## 2018-09-09 LAB — TYPE AND SCREEN
ABO/RH(D): A POS
ANTIBODY SCREEN: NEGATIVE
UNIT DIVISION: 0
UNIT DIVISION: 0

## 2018-09-09 LAB — BPAM RBC
BLOOD PRODUCT EXPIRATION DATE: 201911222359
BLOOD PRODUCT EXPIRATION DATE: 201911232359
ISSUE DATE / TIME: 201910260912
ISSUE DATE / TIME: 201910260912
UNIT TYPE AND RH: 6200
Unit Type and Rh: 6200

## 2018-09-13 ENCOUNTER — Inpatient Hospital Stay: Payer: Medicare HMO

## 2018-09-13 ENCOUNTER — Inpatient Hospital Stay: Payer: Medicare HMO | Attending: Hematology and Oncology

## 2018-09-13 VITALS — BP 119/59 | HR 94 | Temp 98.5°F | Resp 17

## 2018-09-13 DIAGNOSIS — I10 Essential (primary) hypertension: Secondary | ICD-10-CM | POA: Insufficient documentation

## 2018-09-13 DIAGNOSIS — D509 Iron deficiency anemia, unspecified: Secondary | ICD-10-CM

## 2018-09-13 DIAGNOSIS — D500 Iron deficiency anemia secondary to blood loss (chronic): Secondary | ICD-10-CM | POA: Diagnosis not present

## 2018-09-13 DIAGNOSIS — E669 Obesity, unspecified: Secondary | ICD-10-CM | POA: Diagnosis not present

## 2018-09-13 DIAGNOSIS — D638 Anemia in other chronic diseases classified elsewhere: Secondary | ICD-10-CM | POA: Diagnosis not present

## 2018-09-13 DIAGNOSIS — Z79899 Other long term (current) drug therapy: Secondary | ICD-10-CM | POA: Insufficient documentation

## 2018-09-13 DIAGNOSIS — Z7901 Long term (current) use of anticoagulants: Secondary | ICD-10-CM | POA: Diagnosis not present

## 2018-09-13 DIAGNOSIS — Z794 Long term (current) use of insulin: Secondary | ICD-10-CM | POA: Diagnosis not present

## 2018-09-13 DIAGNOSIS — E119 Type 2 diabetes mellitus without complications: Secondary | ICD-10-CM | POA: Insufficient documentation

## 2018-09-13 LAB — CBC WITH DIFFERENTIAL (CANCER CENTER ONLY)
Abs Immature Granulocytes: 0.03 10*3/uL (ref 0.00–0.07)
BASOS ABS: 0 10*3/uL (ref 0.0–0.1)
BASOS PCT: 0 %
EOS PCT: 1 %
Eosinophils Absolute: 0.1 10*3/uL (ref 0.0–0.5)
HCT: 33.4 % — ABNORMAL LOW (ref 36.0–46.0)
HEMOGLOBIN: 9.8 g/dL — AB (ref 12.0–15.0)
Immature Granulocytes: 0 %
LYMPHS PCT: 9 %
Lymphs Abs: 0.6 10*3/uL — ABNORMAL LOW (ref 0.7–4.0)
MCH: 29.1 pg (ref 26.0–34.0)
MCHC: 29.3 g/dL — AB (ref 30.0–36.0)
MCV: 99.1 fL (ref 80.0–100.0)
Monocytes Absolute: 0.7 10*3/uL (ref 0.1–1.0)
Monocytes Relative: 10 %
NRBC: 0 % (ref 0.0–0.2)
Neutro Abs: 5.6 10*3/uL (ref 1.7–7.7)
Neutrophils Relative %: 80 %
PLATELETS: 205 10*3/uL (ref 150–400)
RBC: 3.37 MIL/uL — AB (ref 3.87–5.11)
RDW: 15.9 % — AB (ref 11.5–15.5)
WBC Count: 7.1 10*3/uL (ref 4.0–10.5)

## 2018-09-13 LAB — TYPE AND SCREEN
ABO/RH(D): A POS
Antibody Screen: NEGATIVE

## 2018-09-13 LAB — IRON AND TIBC
IRON: 43 ug/dL (ref 41–142)
Saturation Ratios: 17 % — ABNORMAL LOW (ref 21–57)
TIBC: 254 ug/dL (ref 236–444)
UIBC: 211 ug/dL (ref 120–384)

## 2018-09-13 LAB — FERRITIN: Ferritin: 109 ng/mL (ref 11–307)

## 2018-09-13 MED ORDER — ACETAMINOPHEN 325 MG PO TABS
ORAL_TABLET | ORAL | Status: AC
  Filled 2018-09-13: qty 2

## 2018-09-13 MED ORDER — DIPHENHYDRAMINE HCL 25 MG PO CAPS
ORAL_CAPSULE | ORAL | Status: AC
  Filled 2018-09-13: qty 2

## 2018-09-13 MED ORDER — SODIUM CHLORIDE 0.9 % IV SOLN
INTRAVENOUS | Status: DC
  Administered 2018-09-13: 16:00:00 via INTRAVENOUS
  Filled 2018-09-13 (×2): qty 250

## 2018-09-13 MED ORDER — METHYLPREDNISOLONE SODIUM SUCC 125 MG IJ SOLR
INTRAMUSCULAR | Status: AC
  Filled 2018-09-13: qty 2

## 2018-09-13 MED ORDER — FAMOTIDINE IN NACL 20-0.9 MG/50ML-% IV SOLN: 20.0000 mg | Freq: Once | INTRAVENOUS | Status: DC

## 2018-09-13 MED ORDER — SODIUM CHLORIDE 0.9 % IV SOLN
200.0000 mg | Freq: Once | INTRAVENOUS | Status: AC
  Administered 2018-09-13: 200 mg via INTRAVENOUS
  Filled 2018-09-13: qty 10

## 2018-09-13 MED ORDER — ACETAMINOPHEN 325 MG PO TABS
650.0000 mg | ORAL_TABLET | Freq: Once | ORAL | Status: AC
  Administered 2018-09-13: 650 mg via ORAL

## 2018-09-13 MED ORDER — SODIUM CHLORIDE 0.9 % IV SOLN
20.0000 mg | Freq: Once | INTRAVENOUS | Status: AC
  Administered 2018-09-13: 20 mg via INTRAVENOUS
  Filled 2018-09-13: qty 2

## 2018-09-13 MED ORDER — DIPHENHYDRAMINE HCL 50 MG/ML IJ SOLN: 50.0000 mg | Freq: Once | INTRAMUSCULAR | Status: DC

## 2018-09-13 MED ORDER — DIPHENHYDRAMINE HCL 25 MG PO CAPS
50.0000 mg | ORAL_CAPSULE | Freq: Once | ORAL | Status: AC
  Administered 2018-09-13: 50 mg via ORAL

## 2018-09-13 MED ORDER — METHYLPREDNISOLONE SODIUM SUCC 125 MG IJ SOLR
125.0000 mg | Freq: Once | INTRAMUSCULAR | Status: AC
  Administered 2018-09-13: 125 mg via INTRAVENOUS

## 2018-09-13 MED ORDER — DIPHENHYDRAMINE HCL 50 MG/ML IJ SOLN
INTRAMUSCULAR | Status: AC
  Filled 2018-09-13: qty 1

## 2018-09-13 NOTE — Patient Instructions (Signed)
Iron Sucrose injection What is this medicine? IRON SUCROSE (AHY ern SOO krohs) is an iron complex. Iron is used to make healthy red blood cells, which carry oxygen and nutrients throughout the body. This medicine is used to treat iron deficiency anemia in people with chronic kidney disease. This medicine may be used for other purposes; ask your health care provider or pharmacist if you have questions. COMMON BRAND NAME(S): Venofer What should I tell my health care provider before I take this medicine? They need to know if you have any of these conditions: -anemia not caused by low iron levels -heart disease -high levels of iron in the blood -kidney disease -liver disease -an unusual or allergic reaction to iron, other medicines, foods, dyes, or preservatives -pregnant or trying to get pregnant -breast-feeding How should I use this medicine? This medicine is for infusion into a vein. It is given by a health care professional in a hospital or clinic setting. Talk to your pediatrician regarding the use of this medicine in children. While this drug may be prescribed for children as young as 2 years for selected conditions, precautions do apply. Overdosage: If you think you have taken too much of this medicine contact a poison control center or emergency room at once. NOTE: This medicine is only for you. Do not share this medicine with others. What if I miss a dose? It is important not to miss your dose. Call your doctor or health care professional if you are unable to keep an appointment. What may interact with this medicine? Do not take this medicine with any of the following medications: -deferoxamine -dimercaprol -other iron products This medicine may also interact with the following medications: -chloramphenicol -deferasirox This list may not describe all possible interactions. Give your health care provider a list of all the medicines, herbs, non-prescription drugs, or dietary  supplements you use. Also tell them if you smoke, drink alcohol, or use illegal drugs. Some items may interact with your medicine. What should I watch for while using this medicine? Visit your doctor or healthcare professional regularly. Tell your doctor or healthcare professional if your symptoms do not start to get better or if they get worse. You may need blood work done while you are taking this medicine. You may need to follow a special diet. Talk to your doctor. Foods that contain iron include: whole grains/cereals, dried fruits, beans, or peas, leafy green vegetables, and organ meats (liver, kidney). What side effects may I notice from receiving this medicine? Side effects that you should report to your doctor or health care professional as soon as possible: -allergic reactions like skin rash, itching or hives, swelling of the face, lips, or tongue -breathing problems -changes in blood pressure -cough -fast, irregular heartbeat -feeling faint or lightheaded, falls -fever or chills -flushing, sweating, or hot feelings -joint or muscle aches/pains -seizures -swelling of the ankles or feet -unusually weak or tired Side effects that usually do not require medical attention (report to your doctor or health care professional if they continue or are bothersome): -diarrhea -feeling achy -headache -irritation at site where injected -nausea, vomiting -stomach upset -tiredness This list may not describe all possible side effects. Call your doctor for medical advice about side effects. You may report side effects to FDA at 1-800-FDA-1088. Where should I keep my medicine? This drug is given in a hospital or clinic and will not be stored at home. NOTE: This sheet is a summary. It may not cover all possible information. If   you have questions about this medicine, talk to your doctor, pharmacist, or health care provider.  2018 Elsevier/Gold Standard (2011-08-10 17:14:35)  

## 2018-09-16 ENCOUNTER — Other Ambulatory Visit: Payer: Self-pay | Admitting: Nurse Practitioner

## 2018-09-16 ENCOUNTER — Telehealth: Payer: Self-pay | Admitting: Nurse Practitioner

## 2018-09-16 DIAGNOSIS — D509 Iron deficiency anemia, unspecified: Secondary | ICD-10-CM

## 2018-09-16 NOTE — Telephone Encounter (Signed)
Scheduled appt per 11/4 sch message - pt is aware of appt date and time   

## 2018-09-18 ENCOUNTER — Other Ambulatory Visit: Payer: Self-pay | Admitting: Nurse Practitioner

## 2018-09-18 DIAGNOSIS — D509 Iron deficiency anemia, unspecified: Secondary | ICD-10-CM

## 2018-09-18 MED ORDER — DIPHENOXYLATE-ATROPINE 2.5-0.025 MG PO TABS: ORAL_TABLET | ORAL | 3 refills | Status: DC

## 2018-09-19 ENCOUNTER — Other Ambulatory Visit (HOSPITAL_COMMUNITY): Payer: Self-pay | Admitting: *Deleted

## 2018-09-19 ENCOUNTER — Other Ambulatory Visit: Payer: Self-pay | Admitting: Pharmacy Technician

## 2018-09-19 MED ORDER — APIXABAN 5 MG PO TABS: ORAL_TABLET | ORAL | 2 refills | Status: DC

## 2018-09-19 NOTE — Patient Outreach (Signed)
[facility]) Care Management  09/19/2018  Cassandra Allen [facility] February 10, 1946 [id number]   Incoming call from patient stating she is having trouble getting refills on her Eliquis and Victoza due to not having refills.   [place] and was told the Eliquis script needs refills. [place] office and receptionist sent Escript in for the Eliquis to the company while I was on the phone.  Contacted [place] patient assistance and representative stated company faxed Dr. Stephanie Acre a refill request form today. Contacted Dr. Stephanie Acre office and spoke to [facility] who confirmed the refill fax request was received and that office asks about 24-48 hours to complete.  Will follow up with [employer] and [place] in 2-3 business days to confirm scripts were received.  [name] [name] [name] Certified Pharmacy Technician [name] Management Direct [phone]

## 2018-09-20 ENCOUNTER — Inpatient Hospital Stay: Payer: Medicare HMO

## 2018-09-20 ENCOUNTER — Ambulatory Visit: Payer: Self-pay

## 2018-09-20 ENCOUNTER — Other Ambulatory Visit: Payer: Self-pay

## 2018-09-20 VITALS — BP 120/64 | HR 93 | Temp 98.9°F | Resp 14

## 2018-09-20 DIAGNOSIS — Z794 Long term (current) use of insulin: Secondary | ICD-10-CM | POA: Diagnosis not present

## 2018-09-20 DIAGNOSIS — E669 Obesity, unspecified: Secondary | ICD-10-CM | POA: Diagnosis not present

## 2018-09-20 DIAGNOSIS — I10 Essential (primary) hypertension: Secondary | ICD-10-CM | POA: Diagnosis not present

## 2018-09-20 DIAGNOSIS — D509 Iron deficiency anemia, unspecified: Secondary | ICD-10-CM

## 2018-09-20 DIAGNOSIS — E119 Type 2 diabetes mellitus without complications: Secondary | ICD-10-CM | POA: Diagnosis not present

## 2018-09-20 DIAGNOSIS — Z79899 Other long term (current) drug therapy: Secondary | ICD-10-CM | POA: Diagnosis not present

## 2018-09-20 DIAGNOSIS — Z7901 Long term (current) use of anticoagulants: Secondary | ICD-10-CM | POA: Diagnosis not present

## 2018-09-20 DIAGNOSIS — D500 Iron deficiency anemia secondary to blood loss (chronic): Secondary | ICD-10-CM | POA: Diagnosis not present

## 2018-09-20 DIAGNOSIS — D638 Anemia in other chronic diseases classified elsewhere: Secondary | ICD-10-CM | POA: Diagnosis not present

## 2018-09-20 LAB — CBC WITH DIFFERENTIAL (CANCER CENTER ONLY)
Abs Immature Granulocytes: 0.04 10*3/uL (ref 0.00–0.07)
BASOS ABS: 0 10*3/uL (ref 0.0–0.1)
BASOS PCT: 1 %
EOS ABS: 0.1 10*3/uL (ref 0.0–0.5)
EOS PCT: 1 %
HEMATOCRIT: 32.5 % — AB (ref 36.0–46.0)
Hemoglobin: 9.4 g/dL — ABNORMAL LOW (ref 12.0–15.0)
IMMATURE GRANULOCYTES: 1 %
Lymphocytes Relative: 10 %
Lymphs Abs: 0.8 10*3/uL (ref 0.7–4.0)
MCH: 29 pg (ref 26.0–34.0)
MCHC: 28.9 g/dL — ABNORMAL LOW (ref 30.0–36.0)
MCV: 100.3 fL — ABNORMAL HIGH (ref 80.0–100.0)
Monocytes Absolute: 0.9 10*3/uL (ref 0.1–1.0)
Monocytes Relative: 11 %
NEUTROS PCT: 76 %
NRBC: 0 % (ref 0.0–0.2)
Neutro Abs: 5.9 10*3/uL (ref 1.7–7.7)
Platelet Count: 194 10*3/uL (ref 150–400)
RBC: 3.24 MIL/uL — AB (ref 3.87–5.11)
RDW: 15.6 % — AB (ref 11.5–15.5)
WBC: 7.7 10*3/uL (ref 4.0–10.5)

## 2018-09-20 MED ORDER — DIPHENHYDRAMINE HCL 25 MG PO CAPS
50.0000 mg | ORAL_CAPSULE | Freq: Once | ORAL | Status: AC
  Administered 2018-09-20: 50 mg via ORAL

## 2018-09-20 MED ORDER — METHYLPREDNISOLONE SODIUM SUCC 125 MG IJ SOLR
125.0000 mg | Freq: Once | INTRAMUSCULAR | Status: AC
  Administered 2018-09-20: 125 mg via INTRAVENOUS

## 2018-09-20 MED ORDER — ACETAMINOPHEN 325 MG PO TABS
650.0000 mg | ORAL_TABLET | Freq: Once | ORAL | Status: AC
  Administered 2018-09-20: 650 mg via ORAL

## 2018-09-20 MED ORDER — SODIUM CHLORIDE 0.9 % IV SOLN
200.0000 mg | Freq: Once | INTRAVENOUS | Status: AC
  Administered 2018-09-20: 200 mg via INTRAVENOUS
  Filled 2018-09-20: qty 10

## 2018-09-20 MED ORDER — FAMOTIDINE IN NACL 20-0.9 MG/50ML-% IV SOLN: 20.0000 mg | Freq: Once | INTRAVENOUS | Status: DC

## 2018-09-20 MED ORDER — METHYLPREDNISOLONE SODIUM SUCC 125 MG IJ SOLR
INTRAMUSCULAR | Status: AC
  Filled 2018-09-20: qty 2

## 2018-09-20 MED ORDER — SODIUM CHLORIDE 0.9 % IV SOLN
INTRAVENOUS | Status: DC
  Administered 2018-09-20: 10:00:00 via INTRAVENOUS
  Filled 2018-09-20: qty 250

## 2018-09-20 MED ORDER — DIPHENHYDRAMINE HCL 25 MG PO CAPS
ORAL_CAPSULE | ORAL | Status: AC
  Filled 2018-09-20: qty 2

## 2018-09-20 MED ORDER — ACETAMINOPHEN 325 MG PO TABS
ORAL_TABLET | ORAL | Status: AC
  Filled 2018-09-20: qty 2

## 2018-09-20 MED ORDER — SODIUM CHLORIDE 0.9 % IV SOLN
20.0000 mg | Freq: Once | INTRAVENOUS | Status: AC
  Administered 2018-09-20: 20 mg via INTRAVENOUS
  Filled 2018-09-20: qty 2

## 2018-09-20 NOTE — Patient Instructions (Signed)
Iron Sucrose injection What is this medicine? IRON SUCROSE (AHY ern SOO krohs) is an iron complex. Iron is used to make healthy red blood cells, which carry oxygen and nutrients throughout the body. This medicine is used to treat iron deficiency anemia in people with chronic kidney disease. This medicine may be used for other purposes; ask your health care provider or pharmacist if you have questions. COMMON BRAND NAME(S): Venofer What should I tell my health care provider before I take this medicine? They need to know if you have any of these conditions: -anemia not caused by low iron levels -heart disease -high levels of iron in the blood -kidney disease -liver disease -an unusual or allergic reaction to iron, other medicines, foods, dyes, or preservatives -pregnant or trying to get pregnant -breast-feeding How should I use this medicine? This medicine is for infusion into a vein. It is given by a health care professional in a hospital or clinic setting. Talk to your pediatrician regarding the use of this medicine in children. While this drug may be prescribed for children as young as 2 years for selected conditions, precautions do apply. Overdosage: If you think you have taken too much of this medicine contact a poison control center or emergency room at once. NOTE: This medicine is only for you. Do not share this medicine with others. What if I miss a dose? It is important not to miss your dose. Call your doctor or health care professional if you are unable to keep an appointment. What may interact with this medicine? Do not take this medicine with any of the following medications: -deferoxamine -dimercaprol -other iron products This medicine may also interact with the following medications: -chloramphenicol -deferasirox This list may not describe all possible interactions. Give your health care provider a list of all the medicines, herbs, non-prescription drugs, or dietary  supplements you use. Also tell them if you smoke, drink alcohol, or use illegal drugs. Some items may interact with your medicine. What should I watch for while using this medicine? Visit your doctor or healthcare professional regularly. Tell your doctor or healthcare professional if your symptoms do not start to get better or if they get worse. You may need blood work done while you are taking this medicine. You may need to follow a special diet. Talk to your doctor. Foods that contain iron include: whole grains/cereals, dried fruits, beans, or peas, leafy green vegetables, and organ meats (liver, kidney). What side effects may I notice from receiving this medicine? Side effects that you should report to your doctor or health care professional as soon as possible: -allergic reactions like skin rash, itching or hives, swelling of the face, lips, or tongue -breathing problems -changes in blood pressure -cough -fast, irregular heartbeat -feeling faint or lightheaded, falls -fever or chills -flushing, sweating, or hot feelings -joint or muscle aches/pains -seizures -swelling of the ankles or feet -unusually weak or tired Side effects that usually do not require medical attention (report to your doctor or health care professional if they continue or are bothersome): -diarrhea -feeling achy -headache -irritation at site where injected -nausea, vomiting -stomach upset -tiredness This list may not describe all possible side effects. Call your doctor for medical advice about side effects. You may report side effects to FDA at 1-800-FDA-1088. Where should I keep my medicine? This drug is given in a hospital or clinic and will not be stored at home. NOTE: This sheet is a summary. It may not cover all possible information. If   you have questions about this medicine, talk to your doctor, pharmacist, or health care provider.  2018 Elsevier/Gold Standard (2011-08-10 17:14:35)  

## 2018-09-27 ENCOUNTER — Inpatient Hospital Stay: Payer: Medicare HMO

## 2018-09-27 ENCOUNTER — Inpatient Hospital Stay: Payer: Medicare HMO | Admitting: Hematology

## 2018-09-27 ENCOUNTER — Ambulatory Visit: Payer: Self-pay | Admitting: Hematology

## 2018-09-27 ENCOUNTER — Other Ambulatory Visit: Payer: Self-pay

## 2018-09-27 ENCOUNTER — Ambulatory Visit: Payer: Self-pay

## 2018-09-27 VITALS — BP 110/62 | HR 81 | Temp 98.2°F | Resp 16

## 2018-09-27 DIAGNOSIS — Z7901 Long term (current) use of anticoagulants: Secondary | ICD-10-CM | POA: Diagnosis not present

## 2018-09-27 DIAGNOSIS — D638 Anemia in other chronic diseases classified elsewhere: Secondary | ICD-10-CM | POA: Diagnosis not present

## 2018-09-27 DIAGNOSIS — D509 Iron deficiency anemia, unspecified: Secondary | ICD-10-CM

## 2018-09-27 DIAGNOSIS — D500 Iron deficiency anemia secondary to blood loss (chronic): Secondary | ICD-10-CM

## 2018-09-27 DIAGNOSIS — E669 Obesity, unspecified: Secondary | ICD-10-CM | POA: Diagnosis not present

## 2018-09-27 DIAGNOSIS — E119 Type 2 diabetes mellitus without complications: Secondary | ICD-10-CM | POA: Diagnosis not present

## 2018-09-27 DIAGNOSIS — I10 Essential (primary) hypertension: Secondary | ICD-10-CM | POA: Diagnosis not present

## 2018-09-27 DIAGNOSIS — Z794 Long term (current) use of insulin: Secondary | ICD-10-CM | POA: Diagnosis not present

## 2018-09-27 DIAGNOSIS — D649 Anemia, unspecified: Secondary | ICD-10-CM

## 2018-09-27 DIAGNOSIS — Z79899 Other long term (current) drug therapy: Secondary | ICD-10-CM | POA: Diagnosis not present

## 2018-09-27 LAB — CBC WITH DIFFERENTIAL (CANCER CENTER ONLY)
Abs Immature Granulocytes: 0.03 10*3/uL (ref 0.00–0.07)
BASOS ABS: 0 10*3/uL (ref 0.0–0.1)
Basophils Relative: 0 %
EOS PCT: 1 %
Eosinophils Absolute: 0.1 10*3/uL (ref 0.0–0.5)
HEMATOCRIT: 30.4 % — AB (ref 36.0–46.0)
HEMOGLOBIN: 9 g/dL — AB (ref 12.0–15.0)
Immature Granulocytes: 0 %
LYMPHS ABS: 0.6 10*3/uL — AB (ref 0.7–4.0)
LYMPHS PCT: 7 %
MCH: 29.3 pg (ref 26.0–34.0)
MCHC: 29.6 g/dL — AB (ref 30.0–36.0)
MCV: 99 fL (ref 80.0–100.0)
MONO ABS: 0.7 10*3/uL (ref 0.1–1.0)
Monocytes Relative: 8 %
Neutro Abs: 7.4 10*3/uL (ref 1.7–7.7)
Neutrophils Relative %: 84 %
Platelet Count: 205 10*3/uL (ref 150–400)
RBC: 3.07 MIL/uL — ABNORMAL LOW (ref 3.87–5.11)
RDW: 15.9 % — ABNORMAL HIGH (ref 11.5–15.5)
WBC Count: 9 10*3/uL (ref 4.0–10.5)
nRBC: 0 % (ref 0.0–0.2)

## 2018-09-27 LAB — FERRITIN: Ferritin: 159 ng/mL (ref 11–307)

## 2018-09-27 LAB — IRON AND TIBC
Iron: 48 ug/dL (ref 41–142)
Saturation Ratios: 19 % — ABNORMAL LOW (ref 21–57)
TIBC: 254 ug/dL (ref 236–444)
UIBC: 206 ug/dL (ref 120–384)

## 2018-09-27 MED ORDER — METHYLPREDNISOLONE SODIUM SUCC 125 MG IJ SOLR
125.0000 mg | Freq: Once | INTRAMUSCULAR | Status: AC
  Administered 2018-09-27: 125 mg via INTRAVENOUS

## 2018-09-27 MED ORDER — SODIUM CHLORIDE 0.9 % IV SOLN
200.0000 mg | Freq: Once | INTRAVENOUS | Status: AC
  Administered 2018-09-27: 200 mg via INTRAVENOUS
  Filled 2018-09-27: qty 10

## 2018-09-27 MED ORDER — ACETAMINOPHEN 325 MG PO TABS
650.0000 mg | ORAL_TABLET | Freq: Once | ORAL | Status: AC
  Administered 2018-09-27: 650 mg via ORAL

## 2018-09-27 MED ORDER — METHYLPREDNISOLONE SODIUM SUCC 125 MG IJ SOLR
INTRAMUSCULAR | Status: AC
  Filled 2018-09-27: qty 2

## 2018-09-27 MED ORDER — FAMOTIDINE IN NACL 20-0.9 MG/50ML-% IV SOLN
20.0000 mg | Freq: Once | INTRAVENOUS | Status: AC
  Administered 2018-09-27: 20 mg via INTRAVENOUS

## 2018-09-27 MED ORDER — DIPHENHYDRAMINE HCL 25 MG PO CAPS
50.0000 mg | ORAL_CAPSULE | Freq: Once | ORAL | Status: AC
  Administered 2018-09-27: 50 mg via ORAL

## 2018-09-27 MED ORDER — ACETAMINOPHEN 325 MG PO TABS
ORAL_TABLET | ORAL | Status: AC
  Filled 2018-09-27: qty 2

## 2018-09-27 MED ORDER — FAMOTIDINE IN NACL 20-0.9 MG/50ML-% IV SOLN
INTRAVENOUS | Status: AC
  Filled 2018-09-27: qty 50

## 2018-09-27 MED ORDER — DIPHENHYDRAMINE HCL 25 MG PO CAPS
ORAL_CAPSULE | ORAL | Status: AC
  Filled 2018-09-27: qty 2

## 2018-09-27 MED ORDER — SODIUM CHLORIDE 0.9 % IV SOLN
Freq: Once | INTRAVENOUS | Status: AC
  Administered 2018-09-27: 12:00:00 via INTRAVENOUS
  Filled 2018-09-27: qty 250

## 2018-09-27 NOTE — Progress Notes (Signed)
Patient was not seen today, will schedule to see her on her next lab appointment.  Truitt Merle  09/27/2018

## 2018-09-27 NOTE — Patient Instructions (Addendum)
Iron Sucrose injection What is this medicine? IRON SUCROSE (AHY ern SOO krohs) is an iron complex. Iron is used to make healthy red blood cells, which carry oxygen and nutrients throughout the body. This medicine is used to treat iron deficiency anemia in people with chronic kidney disease. This medicine may be used for other purposes; ask your health care provider or pharmacist if you have questions. COMMON BRAND NAME(S): Venofer What should I tell my health care provider before I take this medicine? They need to know if you have any of these conditions: -anemia not caused by low iron levels -heart disease -high levels of iron in the blood -kidney disease -liver disease -an unusual or allergic reaction to iron, other medicines, foods, dyes, or preservatives -pregnant or trying to get pregnant -breast-feeding How should I use this medicine? This medicine is for infusion into a vein. It is given by a health care professional in a hospital or clinic setting. Talk to your pediatrician regarding the use of this medicine in children. While this drug may be prescribed for children as young as 2 years for selected conditions, precautions do apply. Overdosage: If you think you have taken too much of this medicine contact a poison control center or emergency room at once. NOTE: This medicine is only for you. Do not share this medicine with others. What if I miss a dose? It is important not to miss your dose. Call your doctor or health care professional if you are unable to keep an appointment. What may interact with this medicine? Do not take this medicine with any of the following medications: -deferoxamine -dimercaprol -other iron products This medicine may also interact with the following medications: -chloramphenicol -deferasirox This list may not describe all possible interactions. Give your health care provider a list of all the medicines, herbs, non-prescription drugs, or dietary  supplements you use. Also tell them if you smoke, drink alcohol, or use illegal drugs. Some items may interact with your medicine. What should I watch for while using this medicine? Visit your doctor or healthcare professional regularly. Tell your doctor or healthcare professional if your symptoms do not start to get better or if they get worse. You may need blood work done while you are taking this medicine. You may need to follow a special diet. Talk to your doctor. Foods that contain iron include: whole grains/cereals, dried fruits, beans, or peas, leafy green vegetables, and organ meats (liver, kidney). What side effects may I notice from receiving this medicine? Side effects that you should report to your doctor or health care professional as soon as possible: -allergic reactions like skin rash, itching or hives, swelling of the face, lips, or tongue -breathing problems -changes in blood pressure -cough -fast, irregular heartbeat -feeling faint or lightheaded, falls -fever or chills -flushing, sweating, or hot feelings -joint or muscle aches/pains -seizures -swelling of the ankles or feet -unusually weak or tired Side effects that usually do not require medical attention (report to your doctor or health care professional if they continue or are bothersome): -diarrhea -feeling achy -headache -irritation at site where injected -nausea, vomiting -stomach upset -tiredness This list may not describe all possible side effects. Call your doctor for medical advice about side effects. You may report side effects to FDA at 1-800-FDA-1088. Where should I keep my medicine? This drug is given in a hospital or clinic and will not be stored at home. NOTE: This sheet is a summary. It may not cover all possible information. If   you have questions about this medicine, talk to your doctor, pharmacist, or health care provider.  2018 Elsevier/Gold Standard (2011-08-10 17:14:35)  

## 2018-09-29 DIAGNOSIS — Z0100 Encounter for examination of eyes and vision without abnormal findings: Secondary | ICD-10-CM | POA: Diagnosis not present

## 2018-09-30 ENCOUNTER — Other Ambulatory Visit: Payer: Self-pay | Admitting: Pharmacy Technician

## 2018-09-30 NOTE — Patient Outreach (Addendum)
[place] [facility]) Care Management  09/30/2018  Cassandra Allen [facility] 19-May-1946 [id number]   Follow up call to [place] to confirm new prescription for Victoza and Levemir have been received. Cassandra Allen confirms that scripts were received and put a request in for expedited shipping.  Unsuccessful outreach call to patient to inform, HIPAA compliant voicemail left.   Will follow up with patient in 2-3 business days if call has not been returned.  Cassandra Allen Cassandra Allen [place] Certified Pharmacy Technician Cassandra Allen Management Direct [phone]   ADDENDUM 3:34pm  Incoming call from patient, informed her of the expedited request for her scripts to be shipped from [employer].  Will follow up with patient in 3-5 business days to confirm medication has been received.  Cassandra Allen Cassandra Allen [place] Certified Pharmacy Technician [place] Management Direct [phone]

## 2018-10-01 ENCOUNTER — Telehealth: Payer: Self-pay | Admitting: Hematology

## 2018-10-01 NOTE — Telephone Encounter (Signed)
Scheduled appt per 11/18 sch message - left message for patient with appt date and time.   

## 2018-10-02 ENCOUNTER — Other Ambulatory Visit: Payer: Self-pay | Admitting: Pharmacy Technician

## 2018-10-02 NOTE — Patient Outreach (Signed)
[facility] [facility]) Care Management  10/02/2018  Cassandra Allen [facility] [birth year] [id number]   Incoming call from patient to inform me that she received her refills for Victoza and Levemir from [employer]. Informed patient that program ends at the end of the year and requested that she contact when she has spent about $600 OOP to restart the process.  [name] [name] [place] Certified Pharmacy Technician [place] Management Direct Dial:[phone]

## 2018-10-04 ENCOUNTER — Inpatient Hospital Stay: Payer: Medicare HMO

## 2018-10-04 ENCOUNTER — Encounter: Payer: Self-pay | Admitting: Hematology

## 2018-10-04 ENCOUNTER — Telehealth: Payer: Self-pay | Admitting: Hematology

## 2018-10-04 ENCOUNTER — Inpatient Hospital Stay (HOSPITAL_BASED_OUTPATIENT_CLINIC_OR_DEPARTMENT_OTHER): Payer: Medicare HMO | Admitting: Hematology

## 2018-10-04 VITALS — BP 101/54 | HR 87 | Temp 98.4°F | Resp 18 | Ht 63.0 in | Wt 187.1 lb

## 2018-10-04 VITALS — BP 127/53 | HR 104 | Temp 98.7°F | Resp 16

## 2018-10-04 DIAGNOSIS — D500 Iron deficiency anemia secondary to blood loss (chronic): Secondary | ICD-10-CM

## 2018-10-04 DIAGNOSIS — E119 Type 2 diabetes mellitus without complications: Secondary | ICD-10-CM | POA: Diagnosis not present

## 2018-10-04 DIAGNOSIS — D649 Anemia, unspecified: Secondary | ICD-10-CM

## 2018-10-04 DIAGNOSIS — Z7901 Long term (current) use of anticoagulants: Secondary | ICD-10-CM

## 2018-10-04 DIAGNOSIS — Z794 Long term (current) use of insulin: Secondary | ICD-10-CM

## 2018-10-04 DIAGNOSIS — E669 Obesity, unspecified: Secondary | ICD-10-CM

## 2018-10-04 DIAGNOSIS — Z79899 Other long term (current) drug therapy: Secondary | ICD-10-CM | POA: Diagnosis not present

## 2018-10-04 DIAGNOSIS — I10 Essential (primary) hypertension: Secondary | ICD-10-CM | POA: Diagnosis not present

## 2018-10-04 DIAGNOSIS — D638 Anemia in other chronic diseases classified elsewhere: Secondary | ICD-10-CM

## 2018-10-04 DIAGNOSIS — D509 Iron deficiency anemia, unspecified: Secondary | ICD-10-CM

## 2018-10-04 LAB — CBC WITH DIFFERENTIAL (CANCER CENTER ONLY)
ABS IMMATURE GRANULOCYTES: 0.03 10*3/uL (ref 0.00–0.07)
BASOS ABS: 0 10*3/uL (ref 0.0–0.1)
Basophils Relative: 0 %
Eosinophils Absolute: 0.1 10*3/uL (ref 0.0–0.5)
Eosinophils Relative: 1 %
HCT: 28.5 % — ABNORMAL LOW (ref 36.0–46.0)
Hemoglobin: 8.3 g/dL — ABNORMAL LOW (ref 12.0–15.0)
IMMATURE GRANULOCYTES: 0 %
Lymphocytes Relative: 6 %
Lymphs Abs: 0.5 10*3/uL — ABNORMAL LOW (ref 0.7–4.0)
MCH: 29.4 pg (ref 26.0–34.0)
MCHC: 29.1 g/dL — ABNORMAL LOW (ref 30.0–36.0)
MCV: 101.1 fL — AB (ref 80.0–100.0)
MONOS PCT: 10 %
Monocytes Absolute: 0.8 10*3/uL (ref 0.1–1.0)
NEUTROS ABS: 6.7 10*3/uL (ref 1.7–7.7)
NEUTROS PCT: 83 %
NRBC: 0 % (ref 0.0–0.2)
Platelet Count: 174 10*3/uL (ref 150–400)
RBC: 2.82 MIL/uL — ABNORMAL LOW (ref 3.87–5.11)
RDW: 15.8 % — AB (ref 11.5–15.5)
WBC Count: 8.2 10*3/uL (ref 4.0–10.5)

## 2018-10-04 LAB — PREPARE RBC (CROSSMATCH)

## 2018-10-04 MED ORDER — SODIUM CHLORIDE 0.9 % IV SOLN
200.0000 mg | Freq: Once | INTRAVENOUS | Status: AC
  Administered 2018-10-04: 200 mg via INTRAVENOUS
  Filled 2018-10-04: qty 10

## 2018-10-04 MED ORDER — SODIUM CHLORIDE 0.9 % IV SOLN
INTRAVENOUS | Status: DC
  Administered 2018-10-04: 15:00:00 via INTRAVENOUS
  Filled 2018-10-04: qty 250

## 2018-10-04 MED ORDER — DARBEPOETIN ALFA 100 MCG/0.5ML IJ SOSY: 100.0000 ug | PREFILLED_SYRINGE | Freq: Once | INTRAMUSCULAR | Status: DC

## 2018-10-04 MED ORDER — METHYLPREDNISOLONE SODIUM SUCC 125 MG IJ SOLR
125.0000 mg | Freq: Once | INTRAMUSCULAR | Status: AC
  Administered 2018-10-04: 125 mg via INTRAVENOUS

## 2018-10-04 MED ORDER — FAMOTIDINE IN NACL 20-0.9 MG/50ML-% IV SOLN
20.0000 mg | Freq: Once | INTRAVENOUS | Status: AC
  Administered 2018-10-04: 20 mg via INTRAVENOUS

## 2018-10-04 MED ORDER — FAMOTIDINE IN NACL 20-0.9 MG/50ML-% IV SOLN
INTRAVENOUS | Status: AC
  Filled 2018-10-04: qty 50

## 2018-10-04 MED ORDER — ACETAMINOPHEN 325 MG PO TABS
650.0000 mg | ORAL_TABLET | Freq: Once | ORAL | Status: AC
  Administered 2018-10-04: 650 mg via ORAL

## 2018-10-04 MED ORDER — ACETAMINOPHEN 325 MG PO TABS
ORAL_TABLET | ORAL | Status: AC
  Filled 2018-10-04: qty 2

## 2018-10-04 MED ORDER — METHYLPREDNISOLONE SODIUM SUCC 125 MG IJ SOLR
INTRAMUSCULAR | Status: AC
  Filled 2018-10-04: qty 2

## 2018-10-04 MED ORDER — DIPHENHYDRAMINE HCL 25 MG PO CAPS
50.0000 mg | ORAL_CAPSULE | Freq: Once | ORAL | Status: AC
  Administered 2018-10-04: 50 mg via ORAL

## 2018-10-04 MED ORDER — DIPHENHYDRAMINE HCL 25 MG PO CAPS
ORAL_CAPSULE | ORAL | Status: AC
  Filled 2018-10-04: qty 2

## 2018-10-04 NOTE — Progress Notes (Signed)
[place]   [phone] [phone]   Clinic Follow up Note   Patient Care Team: Cassandra Jordan, MD as PCP - General (Family Medicine)  Date of Service:  10/04/2018  CHIEF COMPLAINT: F/u for Iron deficient anemia   CURRENT THERAPY:  IV Sucrose as needed   INTERVAL HISTORY:  Cassandra Allen is here for a follow up of her anemia. She presents to the clinic today by herself. She notes she feels well and overall stable. She denies fatigue and notes black stool about 2 times every morning. She notes her IV  Feraheme infusion in 04/2018 she had a reaction where she could not breathe and had to be hospitalized. She has been tolerating IV Sucrose well. She notes her bowels are more runny with oral iron so she has not been taking it often lately.  She denies SOB or chest pain.     REVIEW OF SYSTEMS:   Constitutional: Denies fevers, chills or abnormal weight loss Eyes: Denies blurriness of vision Ears, nose, mouth, throat, and face: Denies mucositis or sore throat Respiratory: Denies cough, dyspnea or wheezes Cardiovascular: Denies palpitation, chest discomfort or lower extremity swelling Gastrointestinal:  Denies nausea, heartburn or change in bowel habits (+) loose stool and black stool Skin: Denies abnormal skin rashes Lymphatics: Denies new lymphadenopathy or easy bruising Neurological:Denies numbness, tingling or new weaknesses Behavioral/Psych: Mood is stable, no new changes  All other systems were reviewed with the patient and are negative.  MEDICAL HISTORY:  Past Medical History:  Diagnosis Date  . Arthritis   . Diabetes mellitus without complication ([place])   . Hypertension     SURGICAL HISTORY: History reviewed. No pertinent surgical history.  I have reviewed the social history and family history with the patient and they are unchanged from previous note.  ALLERGIES:  is allergic to metformin and related and  prednisone.  MEDICATIONS:  Current Outpatient Medications  Medication Sig Dispense Refill  . apixaban (ELIQUIS) 5 MG TABS tablet TAKE 1 TABLET (5 MG TOTAL) BY MOUTH 2 (TWO) TIMES DAILY. 180 tablet 2  . beta carotene w/minerals (OCUVITE) tablet Take 1 tablet by mouth daily at 12 noon.    . cholecalciferol (VITAMIN D) 1000 units tablet Take 1,000 Units by mouth at bedtime.    Marland Kitchen diltiazem (CARDIZEM CD) 120 MG 24 hr capsule Take 1 capsule (120 mg total) by mouth 2 (two) times daily. 180 capsule 3  . diphenoxylate-atropine (LOMOTIL) 2.5-0.025 MG tablet Take 1 to 2 tablets 4 times daily as needed 40 tablet 3  . ferrous sulfate 325 (65 FE) MG EC tablet Take 325 mg by mouth 2 (two) times daily.    Marland Kitchen HYDROcodone-acetaminophen (NORCO) 7.5-325 MG tablet Take 1 tablet by mouth 2 (two) times daily as needed (for pain.).     . Insulin Detemir (LEVEMIR FLEXTOUCH) 100 UNIT/ML Pen Inject 10 Units into the skin daily.    Marland Kitchen liraglutide (VICTOZA) 18 MG/3ML SOPN Inject 1.8 mg into the skin every morning.    Marland Kitchen losartan-hydrochlorothiazide (HYZAAR) 100-12.5 MG tablet Take 1 tablet by mouth daily.     . Multiple Vitamin (MULTIVITAMIN WITH MINERALS) TABS tablet Take 2 tablets by mouth daily at 12 noon.     Marland Kitchen oxybutynin (DITROPAN) 5 MG tablet Take 20 mg by mouth daily.    Marland Kitchen oxybutynin (OXYTROL) 3.9 MG/24HR Place 1 patch onto the skin 2 (two) times a week.    . sertraline (ZOLOFT) 100 MG tablet Take 100 mg by mouth  daily at 12 noon.     . simethicone (MYLICON) 80 MG chewable tablet Chew 80-160 mg by mouth 2 (two) times daily as needed for flatulence.     . simvastatin (ZOCOR) 20 MG tablet Take 20 mg by mouth daily at 10 pm.      No current facility-administered medications for this visit.    Facility-Administered Medications Ordered in Other Visits  Medication Dose Route Frequency Provider Last Rate Last Dose  . 0.9 %  sodium chloride infusion   Intravenous Continuous Truitt Merle, MD   Stopped at 10/04/18 1543     PHYSICAL EXAMINATION: ECOG PERFORMANCE STATUS: 1 - Symptomatic but completely ambulatory  Vitals:   10/04/18 1259  BP: (!) 101/54  Pulse: 87  Resp: 18  Temp: 98.4 F (36.9 C)  SpO2: 94%   Filed Weights   10/04/18 1259  Weight: 187 lb 1.6 oz (84.9 kg)    GENERAL:alert, no distress and comfortable SKIN: skin color, texture, turgor are normal, no rashes or significant lesions EYES: normal, Conjunctiva are pink and non-injected, sclera clear OROPHARYNX:no exudate, no erythema and lips, buccal mucosa, and tongue normal  NECK: supple, thyroid normal size, non-tender, without nodularity LYMPH:  no palpable lymphadenopathy in the cervical, axillary or inguinal LUNGS: clear to auscultation and percussion with normal breathing effort HEART: regular rate & rhythm and no murmurs and no lower extremity edema ABDOMEN:abdomen soft, non-tender and normal bowel sounds Musculoskeletal:no cyanosis of digits and no clubbing  NEURO: alert & oriented x 3 with fluent speech, no focal motor/sensory deficits  LABORATORY DATA:  I have reviewed the data as listed CBC Latest Ref Rng & Units 10/04/2018 09/27/2018 09/20/2018  WBC 4.0 - 10.5 K/uL 8.2 9.0 7.7  Hemoglobin 12.0 - 15.0 g/dL 8.3(L) 9.0(L) 9.4(L)  Hematocrit 36.0 - 46.0 % 28.5(L) 30.4(L) 32.5(L)  Platelets 150 - 400 K/uL 174 205 194     CMP Latest Ref Rng & Units 05/31/2018 05/10/2018 05/01/2018  Glucose 70 - 99 mg/dL 159(H) 180(H) 235(H)  BUN 8 - 23 mg/dL 31(H) 25(H) 26  Creatinine 0.44 - 1.00 mg/dL 1.04(H) 0.84 0.99  Sodium 135 - 145 mmol/L 143 146(H) 142  Potassium 3.5 - 5.1 mmol/L 4.8 4.2 4.4  Chloride 98 - 111 mmol/L 111 119(H) 111(H)  CO2 22 - 32 mmol/L 26 22 24   Calcium 8.9 - 10.3 mg/dL 8.3(L) 8.1(L) 8.6  Total Protein 6.5 - 8.1 g/dL 5.3(L) - 5.5(L)  Total Bilirubin 0.3 - 1.2 mg/dL 0.2(L) - 0.2  Alkaline Phos 38 - 126 U/L 57 - 54  AST 15 - 41 U/L 7(L) - 7  ALT 0 - 44 U/L 8 - <6      RADIOGRAPHIC STUDIES: I have  personally reviewed the radiological images as listed and agreed with the findings in the report. No results found.   ASSESSMENT & PLAN:  Cassandra Allen is a 72 y.o. female with    1. Severe anemia, iron deficient anemia and anemia of chronic disease -Her previous lab showed evidence of iron deficiency.  She previously had severe reaction to [facility].  She has been on weekly iron sucrose 200mg , tolerating very well.   -Her 07/19/18 Bone marrow Bx reveals hypercellular marrow with trilineage hematopoiesis. Negative for MDS or malignancy.  -She has been requiring blood transfusion once (2u) a month on avarage. -CBC reviewed, Hg at 8.5. Will give blood transfusion today or next week, type and cross today  -I discussed her levels are dropping faster than we can replenish.  I strongly encouraged her to have a EGD soon to rule out or treat GI bleeding. She is agreeable.  She has appointment with GI in a few weeks. -Continue oral iron daily and IV Sucrose weekly, keep ferritin above 100 and normal iron and saturation  -Even with adequate iron replacement, her anemia has not resolved.  Her anemia has component of anemia of chronic disease, likely secondary to hypertension, obesity, and diabetes. -I recommend she start on low dose Aranesp injections every 4 weeks to further help maintain her levels, decrease her need for blood transfusion. I discussed possible side effects including increased risk of blood clots, such as stroke or heart attack, tumor growth etc. She voiced good understanding, and agrees to proceed.  Will start in 2 weeks.  -F/u in 4 weeks    2. HTN, AF  -She will continue follow-up with his PCP -On Hyzaar, Cardizem and Zocor -I recommend she follow up closer with PCP as her uncontrolled HTN, DM can effect her anemia of chronic disease.   3. DM, obesity  -on insulin, lirraglutide -She does not check her glucose level at home, I encouraged her to do so -With  PCP  PLAN: -Continue Oral iron  -Iron sucrose infusion weekly X8 -Lab every 2 weeks x4 -start Aranesp Injection in 2 and 6 weeks  -F/u in 4 weeks     No problem-specific Assessment & Plan notes found for this encounter.   Orders Placed This Encounter  Procedures  . Practitioner attestation of consent    I, the ordering practitioner, attest that I have discussed with the patient the benefits, risks, side effects, alternatives, likelihood of achieving goals and potential problems during recovery for the procedure listed.    Standing Status:   Future    Standing Expiration Date:   10/04/2019    Order Specific Question:   Procedure    Answer:   Blood Product(s)  . Complete patient signature process for consent form    Standing Status:   Future    Standing Expiration Date:   10/04/2019  . Care order/instruction    Transfuse Parameters    Standing Status:   Future    Standing Expiration Date:   10/04/2019  . Type and screen    Standing Status:   Future    Number of Occurrences:   1    Standing Expiration Date:   10/05/2019   All questions were answered. The patient knows to call the clinic with any problems, questions or concerns. No barriers to learning was detected. I spent 20 minutes counseling the patient face to face. The total time spent in the appointment was 25 minutes and more than 50% was on counseling and review of test results     Truitt Merle, MD 10/04/2018   I, Cassandra Allen, am acting as scribe for Truitt Merle, MD.   I have reviewed the above documentation for accuracy and completeness, and I agree with the above.

## 2018-10-04 NOTE — Patient Instructions (Signed)
Iron Sucrose injection What is this medicine? IRON SUCROSE (AHY ern SOO krohs) is an iron complex. Iron is used to make healthy red blood cells, which carry oxygen and nutrients throughout the body. This medicine is used to treat iron deficiency anemia in people with chronic kidney disease. This medicine may be used for other purposes; ask your health care provider or pharmacist if you have questions. COMMON BRAND NAME(S): Venofer What should I tell my health care provider before I take this medicine? They need to know if you have any of these conditions: -anemia not caused by low iron levels -heart disease -high levels of iron in the blood -kidney disease -liver disease -an unusual or allergic reaction to iron, other medicines, foods, dyes, or preservatives -pregnant or trying to get pregnant -breast-feeding How should I use this medicine? This medicine is for infusion into a vein. It is given by a health care professional in a hospital or clinic setting. Talk to your pediatrician regarding the use of this medicine in children. While this drug may be prescribed for children as young as 2 years for selected conditions, precautions do apply. Overdosage: If you think you have taken too much of this medicine contact a poison control center or emergency room at once. NOTE: This medicine is only for you. Do not share this medicine with others. What if I miss a dose? It is important not to miss your dose. Call your doctor or health care professional if you are unable to keep an appointment. What may interact with this medicine? Do not take this medicine with any of the following medications: -deferoxamine -dimercaprol -other iron products This medicine may also interact with the following medications: -chloramphenicol -deferasirox This list may not describe all possible interactions. Give your health care provider a list of all the medicines, herbs, non-prescription drugs, or dietary  supplements you use. Also tell them if you smoke, drink alcohol, or use illegal drugs. Some items may interact with your medicine. What should I watch for while using this medicine? Visit your doctor or healthcare professional regularly. Tell your doctor or healthcare professional if your symptoms do not start to get better or if they get worse. You may need blood work done while you are taking this medicine. You may need to follow a special diet. Talk to your doctor. Foods that contain iron include: whole grains/cereals, dried fruits, beans, or peas, leafy green vegetables, and organ meats (liver, kidney). What side effects may I notice from receiving this medicine? Side effects that you should report to your doctor or health care professional as soon as possible: -allergic reactions like skin rash, itching or hives, swelling of the face, lips, or tongue -breathing problems -changes in blood pressure -cough -fast, irregular heartbeat -feeling faint or lightheaded, falls -fever or chills -flushing, sweating, or hot feelings -joint or muscle aches/pains -seizures -swelling of the ankles or feet -unusually weak or tired Side effects that usually do not require medical attention (report to your doctor or health care professional if they continue or are bothersome): -diarrhea -feeling achy -headache -irritation at site where injected -nausea, vomiting -stomach upset -tiredness This list may not describe all possible side effects. Call your doctor for medical advice about side effects. You may report side effects to FDA at 1-800-FDA-1088. Where should I keep my medicine? This drug is given in a hospital or clinic and will not be stored at home. NOTE: This sheet is a summary. It may not cover all possible information. If   you have questions about this medicine, talk to your doctor, pharmacist, or health care provider.  2018 Elsevier/Gold Standard (2011-08-10 17:14:35)  

## 2018-10-04 NOTE — Telephone Encounter (Signed)
Printed calendar and avs. °

## 2018-10-05 LAB — BPAM RBC
Blood Product Expiration Date: 201912202359
ISSUE DATE / TIME: 201911221538
UNIT TYPE AND RH: 6200

## 2018-10-05 LAB — TYPE AND SCREEN
ABO/RH(D): A POS
Antibody Screen: NEGATIVE
UNIT DIVISION: 0

## 2018-10-11 ENCOUNTER — Inpatient Hospital Stay: Payer: Medicare HMO

## 2018-10-11 VITALS — BP 117/78 | HR 76 | Temp 98.3°F | Resp 18

## 2018-10-11 DIAGNOSIS — D638 Anemia in other chronic diseases classified elsewhere: Secondary | ICD-10-CM | POA: Diagnosis not present

## 2018-10-11 DIAGNOSIS — I10 Essential (primary) hypertension: Secondary | ICD-10-CM | POA: Diagnosis not present

## 2018-10-11 DIAGNOSIS — E669 Obesity, unspecified: Secondary | ICD-10-CM | POA: Diagnosis not present

## 2018-10-11 DIAGNOSIS — Z79899 Other long term (current) drug therapy: Secondary | ICD-10-CM | POA: Diagnosis not present

## 2018-10-11 DIAGNOSIS — Z794 Long term (current) use of insulin: Secondary | ICD-10-CM | POA: Diagnosis not present

## 2018-10-11 DIAGNOSIS — E119 Type 2 diabetes mellitus without complications: Secondary | ICD-10-CM | POA: Diagnosis not present

## 2018-10-11 DIAGNOSIS — D509 Iron deficiency anemia, unspecified: Secondary | ICD-10-CM

## 2018-10-11 DIAGNOSIS — Z7901 Long term (current) use of anticoagulants: Secondary | ICD-10-CM | POA: Diagnosis not present

## 2018-10-11 DIAGNOSIS — D500 Iron deficiency anemia secondary to blood loss (chronic): Secondary | ICD-10-CM | POA: Diagnosis not present

## 2018-10-11 MED ORDER — SODIUM CHLORIDE 0.9 % IV SOLN
INTRAVENOUS | Status: DC
  Administered 2018-10-11: 13:00:00 via INTRAVENOUS
  Filled 2018-10-11: qty 250

## 2018-10-11 MED ORDER — DIPHENHYDRAMINE HCL 25 MG PO CAPS
50.0000 mg | ORAL_CAPSULE | Freq: Once | ORAL | Status: AC
  Administered 2018-10-11: 50 mg via ORAL

## 2018-10-11 MED ORDER — METHYLPREDNISOLONE SODIUM SUCC 125 MG IJ SOLR
INTRAMUSCULAR | Status: AC
  Filled 2018-10-11: qty 2

## 2018-10-11 MED ORDER — DIPHENHYDRAMINE HCL 25 MG PO CAPS
ORAL_CAPSULE | ORAL | Status: AC
  Filled 2018-10-11: qty 2

## 2018-10-11 MED ORDER — SODIUM CHLORIDE 0.9 % IV SOLN
200.0000 mg | Freq: Once | INTRAVENOUS | Status: AC
  Administered 2018-10-11: 200 mg via INTRAVENOUS
  Filled 2018-10-11: qty 10

## 2018-10-11 MED ORDER — METHYLPREDNISOLONE SODIUM SUCC 125 MG IJ SOLR
125.0000 mg | Freq: Once | INTRAMUSCULAR | Status: AC
  Administered 2018-10-11: 125 mg via INTRAVENOUS

## 2018-10-11 MED ORDER — FAMOTIDINE IN NACL 20-0.9 MG/50ML-% IV SOLN
20.0000 mg | Freq: Once | INTRAVENOUS | Status: AC
  Administered 2018-10-11: 20 mg via INTRAVENOUS

## 2018-10-11 MED ORDER — ACETAMINOPHEN 325 MG PO TABS
650.0000 mg | ORAL_TABLET | Freq: Once | ORAL | Status: AC
  Administered 2018-10-11: 650 mg via ORAL

## 2018-10-11 MED ORDER — ACETAMINOPHEN 325 MG PO TABS
ORAL_TABLET | ORAL | Status: AC
  Filled 2018-10-11: qty 2

## 2018-10-11 MED ORDER — FAMOTIDINE IN NACL 20-0.9 MG/50ML-% IV SOLN
INTRAVENOUS | Status: AC
  Filled 2018-10-11: qty 50

## 2018-10-11 NOTE — Patient Instructions (Signed)
Iron Sucrose injection What is this medicine? IRON SUCROSE (AHY ern SOO krohs) is an iron complex. Iron is used to make healthy red blood cells, which carry oxygen and nutrients throughout the body. This medicine is used to treat iron deficiency anemia in people with chronic kidney disease. This medicine may be used for other purposes; ask your health care provider or pharmacist if you have questions. COMMON BRAND NAME(S): Venofer What should I tell my health care provider before I take this medicine? They need to know if you have any of these conditions: -anemia not caused by low iron levels -heart disease -high levels of iron in the blood -kidney disease -liver disease -an unusual or allergic reaction to iron, other medicines, foods, dyes, or preservatives -pregnant or trying to get pregnant -breast-feeding How should I use this medicine? This medicine is for infusion into a vein. It is given by a health care professional in a hospital or clinic setting. Talk to your pediatrician regarding the use of this medicine in children. While this drug may be prescribed for children as young as 2 years for selected conditions, precautions do apply. Overdosage: If you think you have taken too much of this medicine contact a poison control center or emergency room at once. NOTE: This medicine is only for you. Do not share this medicine with others. What if I miss a dose? It is important not to miss your dose. Call your doctor or health care professional if you are unable to keep an appointment. What may interact with this medicine? Do not take this medicine with any of the following medications: -deferoxamine -dimercaprol -other iron products This medicine may also interact with the following medications: -chloramphenicol -deferasirox This list may not describe all possible interactions. Give your health care provider a list of all the medicines, herbs, non-prescription drugs, or dietary  supplements you use. Also tell them if you smoke, drink alcohol, or use illegal drugs. Some items may interact with your medicine. What should I watch for while using this medicine? Visit your doctor or healthcare professional regularly. Tell your doctor or healthcare professional if your symptoms do not start to get better or if they get worse. You may need blood work done while you are taking this medicine. You may need to follow a special diet. Talk to your doctor. Foods that contain iron include: whole grains/cereals, dried fruits, beans, or peas, leafy green vegetables, and organ meats (liver, kidney). What side effects may I notice from receiving this medicine? Side effects that you should report to your doctor or health care professional as soon as possible: -allergic reactions like skin rash, itching or hives, swelling of the face, lips, or tongue -breathing problems -changes in blood pressure -cough -fast, irregular heartbeat -feeling faint or lightheaded, falls -fever or chills -flushing, sweating, or hot feelings -joint or muscle aches/pains -seizures -swelling of the ankles or feet -unusually weak or tired Side effects that usually do not require medical attention (report to your doctor or health care professional if they continue or are bothersome): -diarrhea -feeling achy -headache -irritation at site where injected -nausea, vomiting -stomach upset -tiredness This list may not describe all possible side effects. Call your doctor for medical advice about side effects. You may report side effects to FDA at 1-800-FDA-1088. Where should I keep my medicine? This drug is given in a hospital or clinic and will not be stored at home. NOTE: This sheet is a summary. It may not cover all possible information. If   you have questions about this medicine, talk to your doctor, pharmacist, or health care provider.  2018 Elsevier/Gold Standard (2011-08-10 17:14:35)  

## 2018-10-13 HISTORY — PX: COLONOSCOPY: SHX174

## 2018-10-18 ENCOUNTER — Inpatient Hospital Stay: Payer: Medicare HMO | Attending: Hematology and Oncology

## 2018-10-18 ENCOUNTER — Inpatient Hospital Stay: Payer: Medicare HMO

## 2018-10-18 VITALS — BP 112/69 | HR 77 | Temp 98.0°F | Resp 17

## 2018-10-18 DIAGNOSIS — E119 Type 2 diabetes mellitus without complications: Secondary | ICD-10-CM | POA: Insufficient documentation

## 2018-10-18 DIAGNOSIS — Z7901 Long term (current) use of anticoagulants: Secondary | ICD-10-CM | POA: Diagnosis not present

## 2018-10-18 DIAGNOSIS — C183 Malignant neoplasm of hepatic flexure: Secondary | ICD-10-CM | POA: Diagnosis not present

## 2018-10-18 DIAGNOSIS — Z79899 Other long term (current) drug therapy: Secondary | ICD-10-CM | POA: Diagnosis not present

## 2018-10-18 DIAGNOSIS — I10 Essential (primary) hypertension: Secondary | ICD-10-CM | POA: Diagnosis not present

## 2018-10-18 DIAGNOSIS — E669 Obesity, unspecified: Secondary | ICD-10-CM | POA: Diagnosis not present

## 2018-10-18 DIAGNOSIS — D509 Iron deficiency anemia, unspecified: Secondary | ICD-10-CM

## 2018-10-18 DIAGNOSIS — Z794 Long term (current) use of insulin: Secondary | ICD-10-CM | POA: Diagnosis not present

## 2018-10-18 DIAGNOSIS — D638 Anemia in other chronic diseases classified elsewhere: Secondary | ICD-10-CM | POA: Diagnosis not present

## 2018-10-18 DIAGNOSIS — D500 Iron deficiency anemia secondary to blood loss (chronic): Secondary | ICD-10-CM | POA: Diagnosis not present

## 2018-10-18 LAB — CBC WITH DIFFERENTIAL (CANCER CENTER ONLY)
Abs Immature Granulocytes: 0.03 10*3/uL (ref 0.00–0.07)
Basophils Absolute: 0 10*3/uL (ref 0.0–0.1)
Basophils Relative: 0 %
EOS ABS: 0.1 10*3/uL (ref 0.0–0.5)
Eosinophils Relative: 2 %
HCT: 27.7 % — ABNORMAL LOW (ref 36.0–46.0)
Hemoglobin: 8.2 g/dL — ABNORMAL LOW (ref 12.0–15.0)
Immature Granulocytes: 0 %
Lymphocytes Relative: 10 %
Lymphs Abs: 0.7 10*3/uL (ref 0.7–4.0)
MCH: 29.9 pg (ref 26.0–34.0)
MCHC: 29.6 g/dL — ABNORMAL LOW (ref 30.0–36.0)
MCV: 101.1 fL — ABNORMAL HIGH (ref 80.0–100.0)
MONOS PCT: 9 %
Monocytes Absolute: 0.6 10*3/uL (ref 0.1–1.0)
Neutro Abs: 5.7 10*3/uL (ref 1.7–7.7)
Neutrophils Relative %: 79 %
Platelet Count: 223 10*3/uL (ref 150–400)
RBC: 2.74 MIL/uL — ABNORMAL LOW (ref 3.87–5.11)
RDW: 15.8 % — ABNORMAL HIGH (ref 11.5–15.5)
WBC Count: 7.3 10*3/uL (ref 4.0–10.5)
nRBC: 0 % (ref 0.0–0.2)

## 2018-10-18 LAB — IRON AND TIBC
Iron: 39 ug/dL — ABNORMAL LOW (ref 41–142)
Saturation Ratios: 15 % — ABNORMAL LOW (ref 21–57)
TIBC: 259 ug/dL (ref 236–444)
UIBC: 219 ug/dL (ref 120–384)

## 2018-10-18 LAB — FERRITIN: FERRITIN: 182 ng/mL (ref 11–307)

## 2018-10-18 MED ORDER — METHYLPREDNISOLONE SODIUM SUCC 125 MG IJ SOLR
125.0000 mg | Freq: Once | INTRAMUSCULAR | Status: AC
  Administered 2018-10-18: 125 mg via INTRAVENOUS

## 2018-10-18 MED ORDER — METHYLPREDNISOLONE SODIUM SUCC 125 MG IJ SOLR
INTRAMUSCULAR | Status: AC
  Filled 2018-10-18: qty 2

## 2018-10-18 MED ORDER — ACETAMINOPHEN 325 MG PO TABS
650.0000 mg | ORAL_TABLET | Freq: Once | ORAL | Status: AC
  Administered 2018-10-18: 650 mg via ORAL

## 2018-10-18 MED ORDER — SODIUM CHLORIDE 0.9 % IV SOLN
Freq: Once | INTRAVENOUS | Status: AC
  Administered 2018-10-18: 13:00:00 via INTRAVENOUS
  Filled 2018-10-18: qty 250

## 2018-10-18 MED ORDER — ACETAMINOPHEN 325 MG PO TABS
ORAL_TABLET | ORAL | Status: AC
  Filled 2018-10-18: qty 2

## 2018-10-18 MED ORDER — DIPHENHYDRAMINE HCL 25 MG PO CAPS
50.0000 mg | ORAL_CAPSULE | Freq: Once | ORAL | Status: AC
  Administered 2018-10-18: 50 mg via ORAL

## 2018-10-18 MED ORDER — FAMOTIDINE IN NACL 20-0.9 MG/50ML-% IV SOLN
INTRAVENOUS | Status: AC
  Filled 2018-10-18: qty 50

## 2018-10-18 MED ORDER — EPOETIN ALFA-EPBX 10000 UNIT/ML IJ SOLN: 20000.0000 [IU] | Freq: Once | INTRAMUSCULAR | Status: DC

## 2018-10-18 MED ORDER — DIPHENHYDRAMINE HCL 25 MG PO CAPS
ORAL_CAPSULE | ORAL | Status: AC
  Filled 2018-10-18: qty 2

## 2018-10-18 MED ORDER — SODIUM CHLORIDE 0.9 % IV SOLN
200.0000 mg | Freq: Once | INTRAVENOUS | Status: AC
  Administered 2018-10-18: 200 mg via INTRAVENOUS
  Filled 2018-10-18: qty 10

## 2018-10-18 MED ORDER — FAMOTIDINE IN NACL 20-0.9 MG/50ML-% IV SOLN
20.0000 mg | Freq: Once | INTRAVENOUS | Status: AC
  Administered 2018-10-18: 20 mg via INTRAVENOUS

## 2018-10-18 NOTE — Patient Instructions (Signed)
Iron Sucrose injection What is this medicine? IRON SUCROSE (AHY ern SOO krohs) is an iron complex. Iron is used to make healthy red blood cells, which carry oxygen and nutrients throughout the body. This medicine is used to treat iron deficiency anemia in people with chronic kidney disease. This medicine may be used for other purposes; ask your health care provider or pharmacist if you have questions. COMMON BRAND NAME(S): Venofer What should I tell my health care provider before I take this medicine? They need to know if you have any of these conditions: -anemia not caused by low iron levels -heart disease -high levels of iron in the blood -kidney disease -liver disease -an unusual or allergic reaction to iron, other medicines, foods, dyes, or preservatives -pregnant or trying to get pregnant -breast-feeding How should I use this medicine? This medicine is for infusion into a vein. It is given by a health care professional in a hospital or clinic setting. Talk to your pediatrician regarding the use of this medicine in children. While this drug may be prescribed for children as young as 2 years for selected conditions, precautions do apply. Overdosage: If you think you have taken too much of this medicine contact a poison control center or emergency room at once. NOTE: This medicine is only for you. Do not share this medicine with others. What if I miss a dose? It is important not to miss your dose. Call your doctor or health care professional if you are unable to keep an appointment. What may interact with this medicine? Do not take this medicine with any of the following medications: -deferoxamine -dimercaprol -other iron products This medicine may also interact with the following medications: -chloramphenicol -deferasirox This list may not describe all possible interactions. Give your health care provider a list of all the medicines, herbs, non-prescription drugs, or dietary  supplements you use. Also tell them if you smoke, drink alcohol, or use illegal drugs. Some items may interact with your medicine. What should I watch for while using this medicine? Visit your doctor or healthcare professional regularly. Tell your doctor or healthcare professional if your symptoms do not start to get better or if they get worse. You may need blood work done while you are taking this medicine. You may need to follow a special diet. Talk to your doctor. Foods that contain iron include: whole grains/cereals, dried fruits, beans, or peas, leafy green vegetables, and organ meats (liver, kidney). What side effects may I notice from receiving this medicine? Side effects that you should report to your doctor or health care professional as soon as possible: -allergic reactions like skin rash, itching or hives, swelling of the face, lips, or tongue -breathing problems -changes in blood pressure -cough -fast, irregular heartbeat -feeling faint or lightheaded, falls -fever or chills -flushing, sweating, or hot feelings -joint or muscle aches/pains -seizures -swelling of the ankles or feet -unusually weak or tired Side effects that usually do not require medical attention (report to your doctor or health care professional if they continue or are bothersome): -diarrhea -feeling achy -headache -irritation at site where injected -nausea, vomiting -stomach upset -tiredness This list may not describe all possible side effects. Call your doctor for medical advice about side effects. You may report side effects to FDA at 1-800-FDA-1088. Where should I keep my medicine? This drug is given in a hospital or clinic and will not be stored at home. NOTE: This sheet is a summary. It may not cover all possible information. If   you have questions about this medicine, talk to your doctor, pharmacist, or health care provider.  2018 Elsevier/Gold Standard (2011-08-10 17:14:35)  

## 2018-10-25 ENCOUNTER — Encounter: Payer: Self-pay | Admitting: Hematology

## 2018-10-25 DIAGNOSIS — D509 Iron deficiency anemia, unspecified: Secondary | ICD-10-CM | POA: Diagnosis not present

## 2018-10-25 DIAGNOSIS — D124 Benign neoplasm of descending colon: Secondary | ICD-10-CM | POA: Diagnosis not present

## 2018-10-25 DIAGNOSIS — R195 Other fecal abnormalities: Secondary | ICD-10-CM | POA: Diagnosis not present

## 2018-10-25 DIAGNOSIS — K2930 Chronic superficial gastritis without bleeding: Secondary | ICD-10-CM | POA: Diagnosis not present

## 2018-10-25 DIAGNOSIS — D125 Benign neoplasm of sigmoid colon: Secondary | ICD-10-CM | POA: Diagnosis not present

## 2018-10-25 DIAGNOSIS — D123 Benign neoplasm of transverse colon: Secondary | ICD-10-CM | POA: Diagnosis not present

## 2018-10-25 DIAGNOSIS — C189 Malignant neoplasm of colon, unspecified: Secondary | ICD-10-CM | POA: Diagnosis not present

## 2018-10-25 DIAGNOSIS — D122 Benign neoplasm of ascending colon: Secondary | ICD-10-CM | POA: Diagnosis not present

## 2018-10-25 DIAGNOSIS — C186 Malignant neoplasm of descending colon: Secondary | ICD-10-CM | POA: Diagnosis not present

## 2018-10-25 DIAGNOSIS — D120 Benign neoplasm of cecum: Secondary | ICD-10-CM | POA: Diagnosis not present

## 2018-10-28 ENCOUNTER — Telehealth: Payer: Self-pay | Admitting: Hematology

## 2018-10-28 ENCOUNTER — Other Ambulatory Visit: Payer: Self-pay | Admitting: Gastroenterology

## 2018-10-28 DIAGNOSIS — K6389 Other specified diseases of intestine: Secondary | ICD-10-CM

## 2018-10-28 NOTE — Telephone Encounter (Signed)
I called back pt regarding her colonoscopy findings and the newly diagnosed colon cancer.  I plan to see her when she comes in for iron infusion in 2 days, and reviewed the results and treatment options with her.  I encouraged her to call me if she has questions before her next appointment.  Truitt Merle  10/28/2018

## 2018-10-29 NOTE — Progress Notes (Signed)
Cassandra Allen   [phone] [phone]   Clinic Follow up Note   Patient Care Team: [name], MD as PCP - General (Family Medicine) 10/30/2018  CHIEF COMPLAINT: F/u on IDA and discuss newly diagnosed colon cancer   CURRENT THERAPY IV Iron Sucrose as needed. Pending colon surgery.   INTERVAL HISTORY: Cassandra Allen is a 72 y.o. female who is here for follow-up. She recently had a colonoscopy that unfortunately revealed colon cancer.  Today, she is here at the infusion room. She denies family history of colon cancer and denies having colonoscopy in the past. She was concerned about getting a colostomy bag and losing her hair if chemo was needed.   Pertinent positives and negatives of review of systems are listed and detailed within the above HPI.  REVIEW OF SYSTEMS:   Constitutional: Denies fevers, chills or abnormal weight loss Eyes: Denies blurriness of vision Ears, nose, mouth, throat, and face: Denies mucositis or sore throat Respiratory: Denies cough, dyspnea or wheezes Cardiovascular: Denies palpitation, chest discomfort or lower extremity swelling Gastrointestinal:  Denies nausea, heartburn or change in bowel habits Skin: Denies abnormal skin rashes Lymphatics: Denies new lymphadenopathy or easy bruising Neurological:Denies numbness, tingling or new weaknesses Behavioral/Psych: Mood is stable, no new changes  All other systems were reviewed with the patient and are negative.  MEDICAL HISTORY:  Past Medical History:  Diagnosis Date  . Arthritis   . Diabetes mellitus without complication ([facility])   . Hypertension     SURGICAL HISTORY: No past surgical history on file.  I have reviewed the social history and family history with the patient and they are unchanged from previous note.  ALLERGIES:  is allergic to metformin and related and prednisone.  MEDICATIONS:  Current Outpatient Medications  Medication Sig Dispense  Refill  . apixaban (ELIQUIS) 5 MG TABS tablet TAKE 1 TABLET (5 MG TOTAL) BY MOUTH 2 (TWO) TIMES DAILY. 180 tablet 2  . beta carotene w/minerals (OCUVITE) tablet Take 1 tablet by mouth daily at 12 noon.    . cholecalciferol (VITAMIN D) 1000 units tablet Take 1,000 Units by mouth at bedtime.    Marland Kitchen diltiazem (CARDIZEM CD) 120 MG 24 hr capsule Take 1 capsule (120 mg total) by mouth 2 (two) times daily. 180 capsule 3  . diphenoxylate-atropine (LOMOTIL) 2.5-0.025 MG tablet Take 1 to 2 tablets 4 times daily as needed 40 tablet 3  . ferrous sulfate 325 (65 FE) MG EC tablet Take 325 mg by mouth 2 (two) times daily.    Marland Kitchen HYDROcodone-acetaminophen (NORCO) 7.5-325 MG tablet Take 1 tablet by mouth 2 (two) times daily as needed (for pain.).     . Insulin Detemir (LEVEMIR FLEXTOUCH) 100 UNIT/ML Pen Inject 10 Units into the skin daily.    Marland Kitchen liraglutide (VICTOZA) 18 MG/3ML SOPN Inject 1.8 mg into the skin every morning.    Marland Kitchen losartan-hydrochlorothiazide (HYZAAR) 100-12.5 MG tablet Take 1 tablet by mouth daily.     . Multiple Vitamin (MULTIVITAMIN WITH MINERALS) TABS tablet Take 2 tablets by mouth daily at 12 noon.     Marland Kitchen oxybutynin (DITROPAN) 5 MG tablet Take 20 mg by mouth daily.    Marland Kitchen oxybutynin (OXYTROL) 3.9 MG/24HR Place 1 patch onto the skin 2 (two) times a week.    . sertraline (ZOLOFT) 100 MG tablet Take 100 mg by mouth daily at 12 noon.     . simethicone (MYLICON) 80 MG chewable tablet Chew 80-160 mg by mouth 2 (two) times daily  as needed for flatulence.     . simvastatin (ZOCOR) 20 MG tablet Take 20 mg by mouth daily at 10 pm.      No current facility-administered medications for this visit.     PHYSICAL EXAMINATION: ECOG PERFORMANCE STATUS: 1 - Symptomatic but completely ambulatory  Vitals: BP 127/68 Pulse 76 RR 17  GENERAL:alert, no distress and comfortable SKIN: skin color, texture, turgor are normal, no rashes or significant lesions EYES: normal, Conjunctiva are pink and non-injected, sclera  clear OROPHARYNX:no exudate, no erythema and lips, buccal mucosa, and tongue normal  NECK: supple, thyroid normal size, non-tender, without nodularity LYMPH:  no palpable lymphadenopathy in the cervical, axillary or inguinal LUNGS: clear to auscultation and percussion with normal breathing effort HEART: regular rate & rhythm and no murmurs and no lower extremity edema ABDOMEN:abdomen soft, non-tender and normal bowel sounds Musculoskeletal:no cyanosis of digits and no clubbing  NEURO: alert & oriented x 3 with fluent speech, no focal motor/sensory deficits  LABORATORY DATA:  I have reviewed the data as listed CBC Latest Ref Rng & Units 10/30/2018 10/18/2018 10/04/2018  WBC 4.0 - 10.5 K/uL 8.2 7.3 8.2  Hemoglobin 12.0 - 15.0 g/dL 8.7(L) 8.2(L) 8.3(L)  Hematocrit 36.0 - 46.0 % 29.7(L) 27.7(L) 28.5(L)  Platelets 150 - 400 K/uL 210 223 174     CMP Latest Ref Rng & Units 10/30/2018 05/31/2018 05/10/2018  Glucose 70 - 99 mg/dL 209(H) 159(H) 180(H)  BUN 8 - 23 mg/dL 20 31(H) 25(H)  Creatinine 0.44 - 1.00 mg/dL 1.07(H) 1.04(H) 0.84  Sodium 135 - 145 mmol/L 143 143 146(H)  Potassium 3.5 - 5.1 mmol/L 4.2 4.8 4.2  Chloride 98 - 111 mmol/L 109 111 119(H)  CO2 22 - 32 mmol/L 25 26 22   Calcium 8.9 - 10.3 mg/dL 8.5(L) 8.3(L) 8.1(L)  Total Protein 6.5 - 8.1 g/dL 5.6(L) 5.3(L) -  Total Bilirubin 0.3 - 1.2 mg/dL 0.3 0.2(L) -  Alkaline Phos 38 - 126 U/L 51 57 -  AST 15 - 41 U/L 7(L) 7(L) -  ALT 0 - 44 U/L 9 8 -    PROCEDURES  10/25/2018 Colonoscopy Findings: -The perianal and digital examinations were normal. -Two sessile polyps were found in the sigmoid colon. The polyps were 4 to 6 mm in size. These polyps were removed with a hot snare. Resection and retrieval were complete. -A 6 mm polyp was found in the descending colon. The polyp was sessile. The polyp was removed with a hot snare. Resection and retrieval were complete. -Three sessile polyps were found in the transverse colon.The polyps  were 5 to 15 mm in size. These polyps were removed with a placemeal technique using a hot snare. Resection and retrieval were complete. -A 12 mm polyp was found in the cecum. The polyp was sessile. The polyp was removed with a placemeal technique using a hot snare. Resection and retrieval were complete.  -A 20 mm polyp was found in the ascending colon. The polyp was sessile. The polyp was removed with a placemeal technique using a hot snare. Resection and retrieval were complete. To close a defect after polypectomy, one hemostatic clip was successfully placed. There was no bleeding at the end of the procedure. -A frond-like/villous, fungating, infiltrative, sessile and ulcerated partially obstructing large mass was found at 55 cm proximal to the anus. The mass was circumferential. The mass measured 5 cm in length. No bleeding was present. This was biopsied for histology. Area was tattood with an injection of 5 ml of [place]  ink.  Impression: -Two 4 to 6 mm sessile polyps were found in the sigmoid colon. Resected and retrieved. -A 6 mm sessile polyp was found in the descending colon. Resected and retrieved. -Three 5 to 15 mm sessile polyps were found in the transverse colon. Resection and retrieval were complete. -A 12 mm sessile polyp was found in the cecum. Resection and retrieval were complete.  -A 20 mm sessile polyp was found in the ascending colon. Resection and retrieval were complete. . -A malignancy partially obstructing large mass was found at 55 cm proximal to the anus. Biopsied and tattooed. -Malignant partially obstructing tumor 20 cm proximal to the anus. Biopsied and tattooed. -Diverticulosis in the sigmoid colon, in the descending colon and in the transverse colon.   10/25/2018 Endoscopy Impression: -Normal esophagus -Z-line regular 35 cm from the incisors. -Non-bleeding erosive gastropathy -Erythematous mucosa in the antrum. Biopsied. -Normal examined duodenum.  Biopsied.   RADIOGRAPHIC STUDIES: I have personally reviewed the radiological images as listed and agreed with the findings in the report. No results found.   ASSESSMENT & PLAN:  AMMARIE MATSUURA is a 71 y.o. female with history of  1. Left colon Cancer, [id number] -Due to her persistent moderate to severe iron deficient anemia, I have repeatedly encouraged her to have a colonoscopy. she declined initially and finally had a colonoscopy on 10/25/2018 that revealed multiple polyps and malignant tumor at the hepatic flexure. -Her staging CT scan is scheduled for later this week -We reviewed the natural history of colon cancer, staging, and treatment strategies -If no evidence of distant metastasis on the CT scan, she will proceed with hemicolectomy.  She has been referred by her gastroenterologist Dr. [name], but no appointment.  I have asked our GI navigator to contact colorectal surgeons and she will be seen at the earliest appointment on November 15, 2018 -I discussed the indication for adjuvant chemotherapy after surgery. -I will call her after her CT scan.  If there is suspicion for distant metastasis, I will see her back for further workup  - Will get IV iron today.  2. Severe anemia, iron deficient anemia and anemia of chronic disease -Currently on IV iron sucrose 200mg . Previously had a severe reaction to Feraheme. BM biopsy was negative for MDS or malignancy. -Labs pending. Will order iron studies today.  3. HTN, AF  -Continue medications and f/u with PCP -She is on Eliquis   4. DM, obesity  -Currently on insulin and Lirraglutide -f/u with PCP and monitor BG at home   Plan  -f/u after surgery -CT scan is scheduled for 11/01/2018. I will call with results -will order iron studies today -IV iron today -lab and iv iron every 2 weeks as needed  -will see her back in a month   No problem-specific Assessment & Plan notes found for this encounter.   No orders of the defined  types were placed in this encounter.  All questions were answered. The patient knows to call the clinic with any problems, questions or concerns. No barriers to learning was detected. I spent 25 minutes counseling the patient face to face. The total time spent in the appointment was 30 minutes and more than 50% was on counseling and review of test results  I, Noor Dweik am acting as scribe for Dr. Truitt Merle.  I have reviewed the above documentation for accuracy and completeness, and I agree with the above.     Truitt Merle, MD 10/30/2018

## 2018-10-30 ENCOUNTER — Inpatient Hospital Stay: Payer: Medicare HMO

## 2018-10-30 ENCOUNTER — Inpatient Hospital Stay (HOSPITAL_BASED_OUTPATIENT_CLINIC_OR_DEPARTMENT_OTHER): Payer: Medicare HMO | Admitting: Hematology

## 2018-10-30 ENCOUNTER — Other Ambulatory Visit: Payer: Self-pay

## 2018-10-30 VITALS — BP 127/68 | HR 76 | Temp 98.6°F | Resp 17

## 2018-10-30 DIAGNOSIS — Z7901 Long term (current) use of anticoagulants: Secondary | ICD-10-CM | POA: Diagnosis not present

## 2018-10-30 DIAGNOSIS — E669 Obesity, unspecified: Secondary | ICD-10-CM | POA: Diagnosis not present

## 2018-10-30 DIAGNOSIS — I10 Essential (primary) hypertension: Secondary | ICD-10-CM | POA: Diagnosis not present

## 2018-10-30 DIAGNOSIS — D509 Iron deficiency anemia, unspecified: Secondary | ICD-10-CM

## 2018-10-30 DIAGNOSIS — E119 Type 2 diabetes mellitus without complications: Secondary | ICD-10-CM

## 2018-10-30 DIAGNOSIS — D638 Anemia in other chronic diseases classified elsewhere: Secondary | ICD-10-CM | POA: Diagnosis not present

## 2018-10-30 DIAGNOSIS — C183 Malignant neoplasm of hepatic flexure: Secondary | ICD-10-CM

## 2018-10-30 DIAGNOSIS — Z794 Long term (current) use of insulin: Secondary | ICD-10-CM

## 2018-10-30 DIAGNOSIS — C186 Malignant neoplasm of descending colon: Secondary | ICD-10-CM

## 2018-10-30 DIAGNOSIS — C185 Malignant neoplasm of splenic flexure: Secondary | ICD-10-CM | POA: Insufficient documentation

## 2018-10-30 DIAGNOSIS — D500 Iron deficiency anemia secondary to blood loss (chronic): Secondary | ICD-10-CM | POA: Diagnosis not present

## 2018-10-30 DIAGNOSIS — Z79899 Other long term (current) drug therapy: Secondary | ICD-10-CM

## 2018-10-30 HISTORY — DX: Malignant neoplasm of descending colon: C18.6

## 2018-10-30 LAB — CBC WITH DIFFERENTIAL (CANCER CENTER ONLY)
Abs Immature Granulocytes: 0.06 10*3/uL (ref 0.00–0.07)
BASOS PCT: 0 %
Basophils Absolute: 0 10*3/uL (ref 0.0–0.1)
EOS ABS: 0 10*3/uL (ref 0.0–0.5)
Eosinophils Relative: 0 %
HCT: 29.7 % — ABNORMAL LOW (ref 36.0–46.0)
Hemoglobin: 8.7 g/dL — ABNORMAL LOW (ref 12.0–15.0)
Immature Granulocytes: 1 %
LYMPHS ABS: 0.4 10*3/uL — AB (ref 0.7–4.0)
Lymphocytes Relative: 5 %
MCH: 29.7 pg (ref 26.0–34.0)
MCHC: 29.3 g/dL — ABNORMAL LOW (ref 30.0–36.0)
MCV: 101.4 fL — ABNORMAL HIGH (ref 80.0–100.0)
Monocytes Absolute: 0.1 10*3/uL (ref 0.1–1.0)
Monocytes Relative: 1 %
Neutro Abs: 7.6 10*3/uL (ref 1.7–7.7)
Neutrophils Relative %: 93 %
PLATELETS: 210 10*3/uL (ref 150–400)
RBC: 2.93 MIL/uL — ABNORMAL LOW (ref 3.87–5.11)
RDW: 14.6 % (ref 11.5–15.5)
WBC Count: 8.2 10*3/uL (ref 4.0–10.5)
nRBC: 0 % (ref 0.0–0.2)

## 2018-10-30 LAB — CMP (CANCER CENTER ONLY)
ALT: 9 U/L (ref 0–44)
AST: 7 U/L — ABNORMAL LOW (ref 15–41)
Albumin: 3.2 g/dL — ABNORMAL LOW (ref 3.5–5.0)
Alkaline Phosphatase: 51 U/L (ref 38–126)
Anion gap: 9 (ref 5–15)
BUN: 20 mg/dL (ref 8–23)
CO2: 25 mmol/L (ref 22–32)
CREATININE: 1.07 mg/dL — AB (ref 0.44–1.00)
Calcium: 8.5 mg/dL — ABNORMAL LOW (ref 8.9–10.3)
Chloride: 109 mmol/L (ref 98–111)
GFR, Estimated: 52 mL/min — ABNORMAL LOW (ref >60)
Glucose, Bld: 209 mg/dL — ABNORMAL HIGH (ref 70–99)
Potassium: 4.2 mmol/L (ref 3.5–5.1)
Sodium: 143 mmol/L (ref 135–145)
Total Bilirubin: 0.3 mg/dL (ref 0.3–1.2)
Total Protein: 5.6 g/dL — ABNORMAL LOW (ref 6.5–8.1)

## 2018-10-30 MED ORDER — FAMOTIDINE IN NACL 20-0.9 MG/50ML-% IV SOLN
20.0000 mg | Freq: Once | INTRAVENOUS | Status: AC
  Administered 2018-10-30: 20 mg via INTRAVENOUS

## 2018-10-30 MED ORDER — METHYLPREDNISOLONE SODIUM SUCC 125 MG IJ SOLR
125.0000 mg | Freq: Once | INTRAMUSCULAR | Status: AC
  Administered 2018-10-30: 125 mg via INTRAVENOUS

## 2018-10-30 MED ORDER — EPOETIN ALFA-EPBX 10000 UNIT/ML IJ SOLN: 20000.0000 [IU] | Freq: Once | INTRAMUSCULAR | Status: DC

## 2018-10-30 MED ORDER — ACETAMINOPHEN 325 MG PO TABS
650.0000 mg | ORAL_TABLET | Freq: Once | ORAL | Status: AC
  Administered 2018-10-30: 650 mg via ORAL

## 2018-10-30 MED ORDER — DIPHENHYDRAMINE HCL 25 MG PO CAPS
ORAL_CAPSULE | ORAL | Status: AC
  Filled 2018-10-30: qty 2

## 2018-10-30 MED ORDER — FAMOTIDINE IN NACL 20-0.9 MG/50ML-% IV SOLN
INTRAVENOUS | Status: AC
  Filled 2018-10-30: qty 50

## 2018-10-30 MED ORDER — SODIUM CHLORIDE 0.9 % IV SOLN
200.0000 mg | Freq: Once | INTRAVENOUS | Status: AC
  Administered 2018-10-30: 200 mg via INTRAVENOUS
  Filled 2018-10-30: qty 10

## 2018-10-30 MED ORDER — ACETAMINOPHEN 325 MG PO TABS
ORAL_TABLET | ORAL | Status: AC
  Filled 2018-10-30: qty 2

## 2018-10-30 MED ORDER — DIPHENHYDRAMINE HCL 25 MG PO CAPS
50.0000 mg | ORAL_CAPSULE | Freq: Once | ORAL | Status: AC
  Administered 2018-10-30: 50 mg via ORAL

## 2018-10-30 MED ORDER — METHYLPREDNISOLONE SODIUM SUCC 125 MG IJ SOLR
INTRAMUSCULAR | Status: AC
  Filled 2018-10-30: qty 2

## 2018-10-30 NOTE — Progress Notes (Signed)
  Oncology Nurse Navigator Documentation   Patient scheduled to see Dr. Marcello Moores on 11/15/17 @ 2:10. Patient aware to arrive @ 1:40 PM to register. Patient voiced understanding.

## 2018-10-30 NOTE — Progress Notes (Signed)
  Oncology Nurse Navigator Documentation  Met with patient to introduce my role as GI navigator and to assess for newly diagnosed colon cancer education needs. Patient stated that she will get information directly from Dr. Burr Medico. Patient declined my contact information.  Per [facility], patient to arrive at 10 AM for 11:30 AM CT scans. Water based contrast is not sent home with patient but provided when arriving early to [facility] radiology. Patient's  Patient verbalized understanding. Teach back used for comprehension.

## 2018-10-30 NOTE — Progress Notes (Signed)
Sent staff message to Referral Coordinator, Cleotis Lema, at [place] to check on referral placed by Dr. [name] week of 10/24/18. Requested appointment for week of 11/04/18 with colorectal surgeon.

## 2018-10-30 NOTE — Patient Instructions (Signed)
Iron Sucrose injection What is this medicine? IRON SUCROSE (AHY ern SOO krohs) is an iron complex. Iron is used to make healthy red blood cells, which carry oxygen and nutrients throughout the body. This medicine is used to treat iron deficiency anemia in people with chronic kidney disease. This medicine may be used for other purposes; ask your health care provider or pharmacist if you have questions. COMMON BRAND NAME(S): Venofer What should I tell my health care provider before I take this medicine? They need to know if you have any of these conditions: -anemia not caused by low iron levels -heart disease -high levels of iron in the blood -kidney disease -liver disease -an unusual or allergic reaction to iron, other medicines, foods, dyes, or preservatives -pregnant or trying to get pregnant -breast-feeding How should I use this medicine? This medicine is for infusion into a vein. It is given by a health care professional in a hospital or clinic setting. Talk to your pediatrician regarding the use of this medicine in children. While this drug may be prescribed for children as young as 2 years for selected conditions, precautions do apply. Overdosage: If you think you have taken too much of this medicine contact a poison control center or emergency room at once. NOTE: This medicine is only for you. Do not share this medicine with others. What if I miss a dose? It is important not to miss your dose. Call your doctor or health care professional if you are unable to keep an appointment. What may interact with this medicine? Do not take this medicine with any of the following medications: -deferoxamine -dimercaprol -other iron products This medicine may also interact with the following medications: -chloramphenicol -deferasirox This list may not describe all possible interactions. Give your health care provider a list of all the medicines, herbs, non-prescription drugs, or dietary  supplements you use. Also tell them if you smoke, drink alcohol, or use illegal drugs. Some items may interact with your medicine. What should I watch for while using this medicine? Visit your doctor or healthcare professional regularly. Tell your doctor or healthcare professional if your symptoms do not start to get better or if they get worse. You may need blood work done while you are taking this medicine. You may need to follow a special diet. Talk to your doctor. Foods that contain iron include: whole grains/cereals, dried fruits, beans, or peas, leafy green vegetables, and organ meats (liver, kidney). What side effects may I notice from receiving this medicine? Side effects that you should report to your doctor or health care professional as soon as possible: -allergic reactions like skin rash, itching or hives, swelling of the face, lips, or tongue -breathing problems -changes in blood pressure -cough -fast, irregular heartbeat -feeling faint or lightheaded, falls -fever or chills -flushing, sweating, or hot feelings -joint or muscle aches/pains -seizures -swelling of the ankles or feet -unusually weak or tired Side effects that usually do not require medical attention (report to your doctor or health care professional if they continue or are bothersome): -diarrhea -feeling achy -headache -irritation at site where injected -nausea, vomiting -stomach upset -tiredness This list may not describe all possible side effects. Call your doctor for medical advice about side effects. You may report side effects to FDA at 1-800-FDA-1088. Where should I keep my medicine? This drug is given in a hospital or clinic and will not be stored at home. NOTE: This sheet is a summary. It may not cover all possible information. If   you have questions about this medicine, talk to your doctor, pharmacist, or health care provider.  2018 Elsevier/Gold Standard (2011-08-10 17:14:35)  

## 2018-10-30 NOTE — Progress Notes (Signed)
  Oncology Nurse Navigator Documentation  Met with patient to introduce my role as GI navigator and to assess for newly diagnosed colon cancer education needs. Patient stated that she will get information directly from Dr. Feng. Patient declined my contact information.  Per [facility], patient to arrive at 10 AM for 11:30 AM CT scans. Water based contrast is not sent home with patient but provided when arriving early to [place] radiology. Patient's  Patient verbalized understanding. Teach back used for comprehension.   

## 2018-10-31 ENCOUNTER — Encounter: Payer: Self-pay | Admitting: Hematology

## 2018-10-31 LAB — IRON AND TIBC
IRON: 392 ug/dL — AB (ref 41–142)
Saturation Ratios: 176 % — ABNORMAL HIGH (ref 21–57)
TIBC: 223 ug/dL — ABNORMAL LOW (ref 236–444)
UIBC: UNDETERMINED ug/dL (ref 120–384)

## 2018-10-31 LAB — FERRITIN: Ferritin: 180 ng/mL (ref 11–307)

## 2018-11-01 ENCOUNTER — Ambulatory Visit
Admission: RE | Admit: 2018-11-01 | Discharge: 2018-11-01 | Disposition: A | Discharge status: Home / Self Care | Payer: Medicare HMO | Source: Ambulatory Visit | Attending: Gastroenterology | Admitting: Gastroenterology

## 2018-11-01 ENCOUNTER — Other Ambulatory Visit: Payer: Self-pay | Admitting: Hematology

## 2018-11-01 ENCOUNTER — Telehealth: Payer: Self-pay | Admitting: Hematology

## 2018-11-01 DIAGNOSIS — K6389 Other specified diseases of intestine: Secondary | ICD-10-CM

## 2018-11-01 DIAGNOSIS — K5730 Diverticulosis of large intestine without perforation or abscess without bleeding: Secondary | ICD-10-CM | POA: Diagnosis not present

## 2018-11-01 DIAGNOSIS — C186 Malignant neoplasm of descending colon: Secondary | ICD-10-CM

## 2018-11-01 DIAGNOSIS — J8410 Pulmonary fibrosis, unspecified: Secondary | ICD-10-CM | POA: Diagnosis not present

## 2018-11-01 MED ORDER — IOHEXOL 300 MG/ML  SOLN
100.0000 mL | Freq: Once | INTRAMUSCULAR | Status: AC | PRN
  Administered 2018-11-01: 100 mL via INTRAVENOUS

## 2018-11-01 NOTE — Telephone Encounter (Signed)
I called pt and discussed her staging CT scan findings with her, which she heard from Dr. [name] this afternoon.  Patient is very anxious and sad, feels she is going to die soon. I told her metastatic colon cancer is treatable, and there is a small change it can even be curable. I recommend a liver biopsy for definitive diagnosis, through IR, she agrees.  Biopsy request was sent today, and I will personally communicated with interventional radiologist to expedite her biopsy.  I plan to see her back after biopsy.  I encouraged her to keep her appointment with Dr. Marcello Moores which is scheduled for December 12, 2017.  Cassandra Allen  11/01/2018

## 2018-11-02 ENCOUNTER — Other Ambulatory Visit: Payer: Self-pay

## 2018-11-02 ENCOUNTER — Emergency Department (HOSPITAL_COMMUNITY): Payer: Medicare HMO

## 2018-11-02 ENCOUNTER — Encounter (HOSPITAL_COMMUNITY): Payer: Self-pay | Admitting: *Deleted

## 2018-11-02 ENCOUNTER — Observation Stay (HOSPITAL_COMMUNITY)
Admission: EM | Admit: 2018-11-02 | Discharge: 2018-11-03 | Disposition: A | Discharge status: Home / Self Care | Payer: Medicare HMO | Attending: Internal Medicine | Admitting: Internal Medicine

## 2018-11-02 DIAGNOSIS — K625 Hemorrhage of anus and rectum: Secondary | ICD-10-CM

## 2018-11-02 DIAGNOSIS — Z794 Long term (current) use of insulin: Secondary | ICD-10-CM | POA: Diagnosis not present

## 2018-11-02 DIAGNOSIS — R339 Retention of urine, unspecified: Secondary | ICD-10-CM | POA: Diagnosis not present

## 2018-11-02 DIAGNOSIS — E785 Hyperlipidemia, unspecified: Secondary | ICD-10-CM | POA: Insufficient documentation

## 2018-11-02 DIAGNOSIS — E119 Type 2 diabetes mellitus without complications: Secondary | ICD-10-CM | POA: Diagnosis not present

## 2018-11-02 DIAGNOSIS — K922 Gastrointestinal hemorrhage, unspecified: Secondary | ICD-10-CM | POA: Diagnosis present

## 2018-11-02 DIAGNOSIS — K921 Melena: Principal | ICD-10-CM | POA: Insufficient documentation

## 2018-11-02 DIAGNOSIS — D62 Acute posthemorrhagic anemia: Secondary | ICD-10-CM | POA: Insufficient documentation

## 2018-11-02 DIAGNOSIS — Z888 Allergy status to other drugs, medicaments and biological substances status: Secondary | ICD-10-CM | POA: Diagnosis not present

## 2018-11-02 DIAGNOSIS — I10 Essential (primary) hypertension: Secondary | ICD-10-CM | POA: Diagnosis not present

## 2018-11-02 DIAGNOSIS — F329 Major depressive disorder, single episode, unspecified: Secondary | ICD-10-CM | POA: Diagnosis not present

## 2018-11-02 DIAGNOSIS — Z7901 Long term (current) use of anticoagulants: Secondary | ICD-10-CM | POA: Diagnosis not present

## 2018-11-02 DIAGNOSIS — C189 Malignant neoplasm of colon, unspecified: Secondary | ICD-10-CM | POA: Diagnosis not present

## 2018-11-02 DIAGNOSIS — I482 Chronic atrial fibrillation, unspecified: Secondary | ICD-10-CM | POA: Insufficient documentation

## 2018-11-02 DIAGNOSIS — Z79899 Other long term (current) drug therapy: Secondary | ICD-10-CM | POA: Diagnosis not present

## 2018-11-02 LAB — COMPREHENSIVE METABOLIC PANEL
ALT: 12 U/L (ref 0–44)
AST: 11 U/L — ABNORMAL LOW (ref 15–41)
Albumin: 3.1 g/dL — ABNORMAL LOW (ref 3.5–5.0)
Alkaline Phosphatase: 34 U/L — ABNORMAL LOW (ref 38–126)
Anion gap: 9 (ref 5–15)
BUN: 34 mg/dL — ABNORMAL HIGH (ref 8–23)
CO2: 23 mmol/L (ref 22–32)
Calcium: 8 mg/dL — ABNORMAL LOW (ref 8.9–10.3)
Chloride: 110 mmol/L (ref 98–111)
Creatinine, Ser: 1.08 mg/dL — ABNORMAL HIGH (ref 0.44–1.00)
GFR calc non Af Amer: 52 mL/min — ABNORMAL LOW (ref >60)
GFR, EST AFRICAN AMERICAN: 60 mL/min — AB (ref >60)
Glucose, Bld: 118 mg/dL — ABNORMAL HIGH (ref 70–99)
Potassium: 3.9 mmol/L (ref 3.5–5.1)
Sodium: 142 mmol/L (ref 135–145)
Total Bilirubin: 0.7 mg/dL (ref 0.3–1.2)
Total Protein: 4.9 g/dL — ABNORMAL LOW (ref 6.5–8.1)

## 2018-11-02 LAB — CBC
HCT: 25.9 % — ABNORMAL LOW (ref 36.0–46.0)
Hemoglobin: 7 g/dL — ABNORMAL LOW (ref 12.0–15.0)
MCH: 29.2 pg (ref 26.0–34.0)
MCHC: 27 g/dL — ABNORMAL LOW (ref 30.0–36.0)
MCV: 107.9 fL — ABNORMAL HIGH (ref 80.0–100.0)
NRBC: 0 % (ref 0.0–0.2)
Platelets: 209 10*3/uL (ref 150–400)
RBC: 2.4 MIL/uL — ABNORMAL LOW (ref 3.87–5.11)
RDW: 15 % (ref 11.5–15.5)
WBC: 8.3 10*3/uL (ref 4.0–10.5)

## 2018-11-02 LAB — GLUCOSE, CAPILLARY
Glucose-Capillary: 109 mg/dL — ABNORMAL HIGH (ref 70–99)
Glucose-Capillary: 236 mg/dL — ABNORMAL HIGH (ref 70–99)

## 2018-11-02 LAB — APTT: aPTT: 37 seconds — ABNORMAL HIGH (ref 24–36)

## 2018-11-02 LAB — PROTIME-INR
INR: 2.2
Prothrombin Time: 24.1 seconds — ABNORMAL HIGH (ref 11.4–15.2)

## 2018-11-02 LAB — ABO/RH: ABO/RH(D): A POS

## 2018-11-02 LAB — PREPARE RBC (CROSSMATCH)

## 2018-11-02 MED ORDER — OXYBUTYNIN 3.9 MG/24HR TD PTTW: 1.0000 | MEDICATED_PATCH | TRANSDERMAL | Status: DC

## 2018-11-02 MED ORDER — VITAMIN D 25 MCG (1000 UNIT) PO TABS
1000.0000 [IU] | ORAL_TABLET | Freq: Every day | ORAL | Status: DC
  Administered 2018-11-02: 1000 [IU] via ORAL
  Filled 2018-11-02: qty 1

## 2018-11-02 MED ORDER — PROSIGHT PO TABS
1.0000 | ORAL_TABLET | Freq: Every day | ORAL | Status: DC
  Administered 2018-11-03: 1 via ORAL
  Filled 2018-11-02: qty 1

## 2018-11-02 MED ORDER — DILTIAZEM HCL ER COATED BEADS 120 MG PO CP24
120.0000 mg | ORAL_CAPSULE | Freq: Two times a day (BID) | ORAL | Status: DC
  Administered 2018-11-02 – 2018-11-03 (×2): 120 mg via ORAL
  Filled 2018-11-02 (×2): qty 1

## 2018-11-02 MED ORDER — ADULT MULTIVITAMIN W/MINERALS CH
1.0000 | ORAL_TABLET | Freq: Every day | ORAL | Status: DC
  Administered 2018-11-03: 1 via ORAL
  Filled 2018-11-02: qty 1

## 2018-11-02 MED ORDER — SERTRALINE HCL 100 MG PO TABS
100.0000 mg | ORAL_TABLET | Freq: Every day | ORAL | Status: DC
  Administered 2018-11-03: 100 mg via ORAL
  Filled 2018-11-02: qty 1

## 2018-11-02 MED ORDER — POLYETHYLENE GLYCOL 3350 17 G PO PACK: 17.0000 g | PACK | Freq: Two times a day (BID) | ORAL | Status: DC

## 2018-11-02 MED ORDER — OXYBUTYNIN CHLORIDE 5 MG PO TABS
20.0000 mg | ORAL_TABLET | Freq: Every day | ORAL | Status: DC
  Administered 2018-11-03: 20 mg via ORAL
  Filled 2018-11-02 (×2): qty 4

## 2018-11-02 MED ORDER — PROTHROMBIN COMPLEX CONC HUMAN 500 UNITS IV KIT
3813.0000 [IU] | PACK | Status: AC
  Administered 2018-11-02: 3813 [IU] via INTRAVENOUS
  Filled 2018-11-02: qty 813

## 2018-11-02 MED ORDER — FERROUS SULFATE 325 (65 FE) MG PO TABS
325.0000 mg | ORAL_TABLET | Freq: Every day | ORAL | Status: DC
  Administered 2018-11-03: 325 mg via ORAL
  Filled 2018-11-02: qty 1

## 2018-11-02 MED ORDER — HYDROCODONE-ACETAMINOPHEN 7.5-325 MG PO TABS: 1.0000 | ORAL_TABLET | Freq: Two times a day (BID) | ORAL | Status: DC | PRN

## 2018-11-02 MED ORDER — SIMVASTATIN 20 MG PO TABS: 20.0000 mg | ORAL_TABLET | Freq: Every day | ORAL | Status: DC

## 2018-11-02 MED ORDER — INSULIN ASPART 100 UNIT/ML ~~LOC~~ SOLN
0.0000 [IU] | Freq: Every day | SUBCUTANEOUS | Status: DC
  Administered 2018-11-02: 2 [IU] via SUBCUTANEOUS

## 2018-11-02 MED ORDER — SODIUM CHLORIDE 0.9 % IV SOLN: 10.0000 mL/h | Freq: Once | INTRAVENOUS | Status: DC

## 2018-11-02 MED ORDER — ATORVASTATIN CALCIUM 10 MG PO TABS
10.0000 mg | ORAL_TABLET | Freq: Every day | ORAL | Status: DC
  Administered 2018-11-02: 10 mg via ORAL
  Filled 2018-11-02: qty 1

## 2018-11-02 MED ORDER — INSULIN ASPART 100 UNIT/ML ~~LOC~~ SOLN
0.0000 [IU] | Freq: Three times a day (TID) | SUBCUTANEOUS | Status: DC
  Administered 2018-11-03: 1 [IU] via SUBCUTANEOUS
  Administered 2018-11-03: 3 [IU] via SUBCUTANEOUS

## 2018-11-02 NOTE — ED Provider Notes (Signed)
[facility] DEPT Provider Note   CSN: [id number] Arrival date & time: 11/02/18  0945     History   Chief Complaint Chief Complaint  Patient presents with  . GI Bleeding    HPI Cassandra Allen is a 72 y.o. female.  HPI  72 year old female with chronic anemia, recent diagnosis of colon cancer presents today with rectal bleeding after recent colonoscopy within the past 1 to 2 weeks.  Reports several episodes of maroon stool that began last night.  She denies any abdominal pain.  She denies any new weakness, lightheadedness, or syncope, chest pain, or dyspnea.  She is on Eliquis for chronic atrial fibrillation.  Reports having a CT scan done yesterday.  Past Medical History:  Diagnosis Date  . Arthritis   . Cancer ([place])   . Diabetes mellitus without complication ([place])   . Hypertension     Patient Active Problem List   Diagnosis Date Noted  . Cancer of left colon ([place]) 10/30/2018  . Anemia 07/06/2018  . Iron deficiency anemia 04/04/2018  . 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 Pain in right ankle and joints of right foot 01/10/2018  . Acute renal failure ([place]) 03/25/2015  . Essential (primary) hypertension 03/25/2015  . Diabetes mellitus without complication ([place]) 44/01/4741  . AKI (acute kidney injury) ([place]) 03/25/2015    History reviewed. No pertinent surgical history.   OB History   No obstetric history on file.      Home Medications    Prior to Admission medications   Medication Sig Start Date End Date Taking? Authorizing Provider  apixaban (ELIQUIS) 5 MG TABS tablet TAKE 1 TABLET (5 MG TOTAL) BY MOUTH 2 (TWO) TIMES DAILY. 09/19/18  Yes [name], NP  beta carotene w/minerals (OCUVITE) tablet Take 1 tablet by mouth daily at 12 noon.   Yes [provider]  cholecalciferol (VITAMIN D) 1000 units tablet Take 1,000 Units by mouth at bedtime.   Yes [provider]  diltiazem (CARDIZEM CD) 120 MG 24 hr capsule Take 1 capsule (120 mg total) by mouth 2 (two) times daily. 11/22/17  Yes [name], NP  diphenoxylate-atropine (LOMOTIL) 2.5-0.025 MG tablet Take 1 to 2 tablets 4 times daily as needed 09/18/18  Yes Alla Feeling, NP  ferrous sulfate 325 (65 FE) MG EC tablet Take 325 mg by mouth daily with breakfast.    Yes [provider]  HYDROcodone-acetaminophen (NORCO) 7.5-325 MG tablet Take 1 tablet by mouth 2 (two) times daily as needed (for pain.).    Yes [provider]  Insulin Detemir (LEVEMIR FLEXTOUCH) 100 UNIT/ML Pen Inject 10 Units into the skin daily.   Yes [provider]  liraglutide (VICTOZA) 18 MG/3ML SOPN Inject 1.8 mg into the skin every morning.   Yes [provider]  losartan-hydrochlorothiazide (HYZAAR) 100-12.5 MG tablet Take 1 tablet by mouth daily.    Yes [provider]  Multiple Vitamin (MULTIVITAMIN WITH MINERALS) TABS tablet Take 2 tablets by mouth daily at 12 noon.    Yes [provider]  oxybutynin (DITROPAN) 5 MG tablet Take 20 mg by mouth daily.   Yes [provider]  oxybutynin (OXYTROL) 3.9 MG/24HR Place 1 patch onto the skin 2 (two) times a week.   Yes [provider]  sertraline (ZOLOFT) 100 MG tablet Take 100 mg by mouth daily at 12 noon.    Yes [provider]  simethicone (MYLICON) 80  MG chewable tablet Chew 80-160 mg by mouth 2 (two) times daily as needed for flatulence.  02/23/15  Yes [provider]  simvastatin (ZOCOR) 20 MG tablet Take 20 mg by mouth daily at 10 pm.  02/28/15  Yes [provider]    Family History No family history on file.  Social History Social History   Tobacco Use  . Smoking status: Never Smoker  . Smokeless tobacco: Never Used  Substance Use Topics  . Alcohol use: No  . Drug use: No     Allergies   Metformin and related and Prednisone   Review of Systems Review of  Systems  All other systems reviewed and are negative.    Physical Exam Updated Vital Signs BP 115/63 (BP Location: Left Arm)   Pulse 82   Temp 99 F (37.2 C) (Oral)   Resp 16   Ht 1.6 m (5\' 3" )   Wt 77.1 kg   SpO2 94%   BMI 30.11 kg/m   Physical Exam Vitals signs and nursing note reviewed.  Constitutional:      Appearance: Normal appearance.  HENT:     Head: Normocephalic and atraumatic.     Right Ear: External ear normal.     Left Ear: External ear normal.     Nose: Nose normal.     Mouth/Throat:     Mouth: Mucous membranes are dry.  Eyes:     Extraocular Movements: Extraocular movements intact.     Conjunctiva/sclera: Conjunctivae normal.     Pupils: Pupils are equal, round, and reactive to light.  Neck:     Musculoskeletal: Normal range of motion.  Cardiovascular:     Rate and Rhythm: Rhythm irregularly irregular.  Pulmonary:     Effort: Pulmonary effort is normal.     Breath sounds: Normal breath sounds.  Abdominal:     General: Abdomen is flat. Bowel sounds are normal.     Palpations: Abdomen is soft.  Genitourinary:    Rectum: Guaiac result positive.     Comments: Maroon stool on rectal exam Musculoskeletal: Normal range of motion.  Skin:    General: Skin is warm and dry.     Capillary Refill: Capillary refill takes less than 2 seconds.  Neurological:     General: No focal deficit present.     Mental Status: She is alert and oriented to person, place, and time.  Psychiatric:        Mood and Affect: Mood normal.      ED Treatments / Results  Labs (all labs ordered are listed, but only abnormal results are displayed) Labs Reviewed  COMPREHENSIVE METABOLIC PANEL - Abnormal; Notable for the following components:      Result Value   Glucose, Bld 118 (*)    BUN 34 (*)    Creatinine, Ser 1.08 (*)    Calcium 8.0 (*)    Total Protein 4.9 (*)    Albumin 3.1 (*)    AST 11 (*)    Alkaline Phosphatase 34 (*)    GFR calc non Af Amer 52 (*)    GFR calc  Af Amer 60 (*)    All other components within normal limits  CBC - Abnormal; Notable for the following components:   RBC 2.40 (*)    Hemoglobin 7.0 (*)    HCT 25.9 (*)    MCV 107.9 (*)    MCHC 27.0 (*)    All other components within normal limits  PROTIME-INR - Abnormal; Notable for the  following components:   Prothrombin Time 24.1 (*)    All other components within normal limits  APTT - Abnormal; Notable for the following components:   aPTT 37 (*)    All other components within normal limits  POC OCCULT BLOOD, ED  TYPE AND SCREEN  PREPARE RBC (CROSSMATCH)  ABO/RH    EKG None  Radiology Ct Chest W Contrast  Result Date: 11/01/2018 CLINICAL DATA:  72 year old female with colonic mass is seen on colonoscopy 1 week ago. Anemia in GI bleed for 6 months. No prior history of cancer. Initial encounter. EXAM: CT CHEST, ABDOMEN, AND PELVIS WITH CONTRAST TECHNIQUE: Multidetector CT imaging of the chest, abdomen and pelvis was performed following the standard protocol during bolus administration of intravenous contrast. CONTRAST:  125mL OMNIPAQUE IOHEXOL 300 MG/ML  SOLN COMPARISON:  05/10/2018 chest x-Tylik Treese.  No comparison CT. FINDINGS: CT CHEST FINDINGS Cardiovascular: Cardiomegaly. Prominent epicardial fat. Coronary artery calcification. Calcified aortic and mitral valve. Atherosclerotic changes aorta and great vessels. Ascending thoracic aorta measures up to 3.2 cm. No central pulmonary embolus noted. Mediastinum/Nodes: Abnormal appearance of the thyroid gland with left lower lobe ill-defined mass spanning over 3 cm. Right paratracheal adenopathy (immediately superior to the azygos vein) which short axis dimension of 1.2 cm. Aortic pulmonary window top-normal size lymph node. Lungs/Pleura: Small number of calcified granulomas. Scattered areas of scarring/subsegmental atelectasis. Scattered lungs cyst. No focal mass suspicious for pulmonary metastatic disease. Lung base atelectasis. Trachea and  mainstem bronchi are patent. Musculoskeletal: Degenerative changes thoracic spine. No osseous destructive lesion. CT ABDOMEN PELVIS FINDINGS Hepatobiliary: Numerous hepatic lesions suspicious for metastatic disease largest within the caudate lobe measuring up to 3.2 cm. Liver top-normal size. Gallbladder wall thickening. This may be related to increased right heart pressure or liver disease but could not exclude cholecystitis in the proper clinical setting. Slight haziness porta hepatis region. Pancreas: No worrisome pancreatic lesion. Spleen: Numerous splenic lesions which in the present clinical setting is suspicious for combination of metastatic disease and splenic cysts. Adrenals/Urinary Tract: No obstructing stone or hydronephrosis. Renal low-density structures too small to characterize possibly cyst. 3.2 cm left adrenal mass suspicious for metastatic disease. No right adrenal mass. Gas within the urinary bladder may be related to recent manipulation. No adjacent fistula identified. Stomach/Bowel: Question sigmoid colon and possibly proximal descending colon mass. Rectosigmoid colon mass also not excluded. Correlation with colonoscopy results recommended. Prominent number of colonic diverticula. Slight third spacing of fluid makes it difficult to evaluate for the possibility of diverticulitis. Radiopaque 1.5 cm structure within the right colon may be related to ingested foreign body. Under distended stomach without gross abnormality. Vascular/Lymphatic: Atherosclerotic changes aorta and aortic branch vessels without abdominal aortic aneurysm or large vessel occlusion. Low-density fatty appearing structures in the external iliac region/pelvic sidewall bilaterally with adjacent low-density iliac lymph nodes and retroperitoneal lymph nodes raises possibility of low-density metastatic adenopathy. Prominent size portacaval lymph node which short axis dimension of 1.3 cm. Reproductive: Left adnexal 2.4 cm cyst.  Other: Third spacing of fluid. No free air. Lax abdominal musculature without bowel containing hernia. Musculoskeletal: Degenerative changes lumbar spine most notable L4-5 and L5-S1 with 5 mm anterior slip L4 secondary to facet degenerative changes. Hip joint degenerative changes. No osseous destructive lesion. IMPRESSION: CT CHEST: 1. Right paratracheal adenopathy (immediately superior to the azygos vein) which short axis dimension of 1.2 cm. In the present clinical setting it is possible this is related to metastatic involvement. 2. Scattered pulmonary parenchymal changes none of which are highly  suspicious for metastatic disease. 3. Abnormal appearance of the thyroid gland with left lower lobe ill-defined mass spanning over 3 cm. Thyroid ultrasound can be performed for further delineation. 4. Cardiomegaly. Coronary artery calcification. 5.  Aortic Atherosclerosis (ICD10-I70.0). CT ABDOMEN PELVIS: 1. Numerous hepatic lesions suspicious for metastatic disease largest within the caudate lobe measuring up to 3.2 cm. 2. Gallbladder wall thickening. This may be related to increased right heart pressure or liver disease but could not exclude cholecystitis in the proper clinical setting. 3. Numerous splenic lesions which in the present clinical setting is suspicious for combination of metastatic disease and splenic cysts. 4. 3.2 cm left adrenal mass suspicious for metastatic disease. 5. Question sigmoid colon and possibly proximal descending colon mass. Rectosigmoid colon mass also not excluded. Correlation with colonoscopy results recommended. 6. Prominent number of colonic diverticula. Third spacing of fluid makes it difficult to evaluate for the possibility of diverticulitis. Radiopaque 1.5 cm structure within the right colon may be related to ingested foreign body. 7. Low-density fatty appearing structures in the external iliac region/pelvic sidewall bilaterally with adjacent low-density iliac lymph nodes and  retroperitoneal lymph nodes raises possibility of low-density metastatic adenopathy. Prominent size portacaval lymph node which short axis dimension of 1.3 cm. PET-CT could be obtained for further delineation if clinically desired. 8. Gas within the urinary bladder may be related to recent manipulation. Clinical correlation recommended. 9. Left adnexal 2.4 cm cyst. This can be assessed with pelvic sonogram. These results will be called to the ordering clinician or representative by the Radiologist Assistant, and communication documented in the PACS or zVision Dashboard. Electronically Signed   By: Genia Del M.D.   On: 11/01/2018 13:13   Ct Abdomen Pelvis W Contrast  Result Date: 11/01/2018 CLINICAL DATA:  72 year old female with colonic mass is seen on colonoscopy 1 week ago. Anemia in GI bleed for 6 months. No prior history of cancer. Initial encounter. EXAM: CT CHEST, ABDOMEN, AND PELVIS WITH CONTRAST TECHNIQUE: Multidetector CT imaging of the chest, abdomen and pelvis was performed following the standard protocol during bolus administration of intravenous contrast. CONTRAST:  153mL OMNIPAQUE IOHEXOL 300 MG/ML  SOLN COMPARISON:  05/10/2018 chest x-Jhoan Schmieder.  No comparison CT. FINDINGS: CT CHEST FINDINGS Cardiovascular: Cardiomegaly. Prominent epicardial fat. Coronary artery calcification. Calcified aortic and mitral valve. Atherosclerotic changes aorta and great vessels. Ascending thoracic aorta measures up to 3.2 cm. No central pulmonary embolus noted. Mediastinum/Nodes: Abnormal appearance of the thyroid gland with left lower lobe ill-defined mass spanning over 3 cm. Right paratracheal adenopathy (immediately superior to the azygos vein) which short axis dimension of 1.2 cm. Aortic pulmonary window top-normal size lymph node. Lungs/Pleura: Small number of calcified granulomas. Scattered areas of scarring/subsegmental atelectasis. Scattered lungs cyst. No focal mass suspicious for pulmonary metastatic  disease. Lung base atelectasis. Trachea and mainstem bronchi are patent. Musculoskeletal: Degenerative changes thoracic spine. No osseous destructive lesion. CT ABDOMEN PELVIS FINDINGS Hepatobiliary: Numerous hepatic lesions suspicious for metastatic disease largest within the caudate lobe measuring up to 3.2 cm. Liver top-normal size. Gallbladder wall thickening. This may be related to increased right heart pressure or liver disease but could not exclude cholecystitis in the proper clinical setting. Slight haziness porta hepatis region. Pancreas: No worrisome pancreatic lesion. Spleen: Numerous splenic lesions which in the present clinical setting is suspicious for combination of metastatic disease and splenic cysts. Adrenals/Urinary Tract: No obstructing stone or hydronephrosis. Renal low-density structures too small to characterize possibly cyst. 3.2 cm left adrenal mass suspicious for metastatic disease. No  right adrenal mass. Gas within the urinary bladder may be related to recent manipulation. No adjacent fistula identified. Stomach/Bowel: Question sigmoid colon and possibly proximal descending colon mass. Rectosigmoid colon mass also not excluded. Correlation with colonoscopy results recommended. Prominent number of colonic diverticula. Slight third spacing of fluid makes it difficult to evaluate for the possibility of diverticulitis. Radiopaque 1.5 cm structure within the right colon may be related to ingested foreign body. Under distended stomach without gross abnormality. Vascular/Lymphatic: Atherosclerotic changes aorta and aortic branch vessels without abdominal aortic aneurysm or large vessel occlusion. Low-density fatty appearing structures in the external iliac region/pelvic sidewall bilaterally with adjacent low-density iliac lymph nodes and retroperitoneal lymph nodes raises possibility of low-density metastatic adenopathy. Prominent size portacaval lymph node which short axis dimension of 1.3 cm.  Reproductive: Left adnexal 2.4 cm cyst. Other: Third spacing of fluid. No free air. Lax abdominal musculature without bowel containing hernia. Musculoskeletal: Degenerative changes lumbar spine most notable L4-5 and L5-S1 with 5 mm anterior slip L4 secondary to facet degenerative changes. Hip joint degenerative changes. No osseous destructive lesion. IMPRESSION: CT CHEST: 1. Right paratracheal adenopathy (immediately superior to the azygos vein) which short axis dimension of 1.2 cm. In the present clinical setting it is possible this is related to metastatic involvement. 2. Scattered pulmonary parenchymal changes none of which are highly suspicious for metastatic disease. 3. Abnormal appearance of the thyroid gland with left lower lobe ill-defined mass spanning over 3 cm. Thyroid ultrasound can be performed for further delineation. 4. Cardiomegaly. Coronary artery calcification. 5.  Aortic Atherosclerosis (ICD10-I70.0). CT ABDOMEN PELVIS: 1. Numerous hepatic lesions suspicious for metastatic disease largest within the caudate lobe measuring up to 3.2 cm. 2. Gallbladder wall thickening. This may be related to increased right heart pressure or liver disease but could not exclude cholecystitis in the proper clinical setting. 3. Numerous splenic lesions which in the present clinical setting is suspicious for combination of metastatic disease and splenic cysts. 4. 3.2 cm left adrenal mass suspicious for metastatic disease. 5. Question sigmoid colon and possibly proximal descending colon mass. Rectosigmoid colon mass also not excluded. Correlation with colonoscopy results recommended. 6. Prominent number of colonic diverticula. Third spacing of fluid makes it difficult to evaluate for the possibility of diverticulitis. Radiopaque 1.5 cm structure within the right colon may be related to ingested foreign body. 7. Low-density fatty appearing structures in the external iliac region/pelvic sidewall bilaterally with adjacent  low-density iliac lymph nodes and retroperitoneal lymph nodes raises possibility of low-density metastatic adenopathy. Prominent size portacaval lymph node which short axis dimension of 1.3 cm. PET-CT could be obtained for further delineation if clinically desired. 8. Gas within the urinary bladder may be related to recent manipulation. Clinical correlation recommended. 9. Left adnexal 2.4 cm cyst. This can be assessed with pelvic sonogram. These results will be called to the ordering clinician or representative by the Radiologist Assistant, and communication documented in the PACS or zVision Dashboard. Electronically Signed   By: Genia Del M.D.   On: 11/01/2018 13:13   Dg Chest Port 1 View  Result Date: 11/02/2018 CLINICAL DATA:  Colon cancer diagnosed 3 days ago by colonoscopy, drank contrast for a CT exam and then had a bloody bowel movement EXAM: PORTABLE CHEST 1 VIEW COMPARISON:  Portable exam 1153 hours compared to 05/10/2018 FINDINGS: Enlargement of cardiac silhouette. Atherosclerotic calcification aorta. Mediastinal contours and pulmonary vascularity normal. Lungs clear. No infiltrate, pleural effusion, or pneumothorax. Bones demineralized. IMPRESSION: Enlargement of cardiac silhouette. No acute abnormalities.  Electronically Signed   By: Lavonia Dana M.D.   On: 11/02/2018 13:01    Procedures .Critical Care Performed by: [name], MD Authorized by: [name], MD   Critical care provider statement:    Critical care time (minutes):  60   Critical care end time:  11/02/2018 2:24 PM   Critical care was necessary to treat or prevent imminent or life-threatening deterioration of the following conditions:  Circulatory failure   Critical care was time spent personally by me on the following activities:  Discussions with consultants, evaluation of patient's response to treatment, examination of patient, ordering and performing treatments and interventions, ordering and review of laboratory  studies, re-evaluation of patient's condition and review of old charts   (including critical care time)  Medications Ordered in ED Medications  0.9 %  sodium chloride infusion (has no administration in time range)  prothrombin complex conc human (KCENTRA) IVPB 3,813 Units (3,813 Units Intravenous New Bag/Given 11/02/18 1251)     Initial Impression / Assessment and Plan / ED Course  I have reviewed the triage vital signs and the nursing notes.  Pertinent labs & imaging results that were available during my care of the patient were reviewed by me and considered in my medical decision making (see chart for details).   Discussed results with Dr. Latricia Heft will be in to see and evaluate patient. Patient with rectal bleeding post polypectomy Patient on Eliquis Reversal of anticoagulation initiated with Kcentra on 1 unit packed red blood cells order Patient with significant anemia per imposed on chronic anemia.  Level today is 7 with baseline of 8.  She has remained hemodynamically stable. Patient voicing good understanding of why she needs to stay.  Extensive conversation with patient and son.  Patient has insisted that she should be able to go home.  But discussed with her that this could be life-threatening. Discussed with Dr. Florene Glen, on-call for hospitalist and he will see for admission    Final Clinical Impressions(s) / ED Diagnoses   Final diagnoses:  Rectal bleed    ED Discharge Orders    None       [name], MD 11/02/18 1424

## 2018-11-02 NOTE — H&P (Addendum)
History and Physical    Cassandra Allen:[id number] DOB: 20-Oct-1946 DOA: 11/02/2018  PCP: [name], MD  Patient coming from: home  I have personally briefly reviewed patient's old medical records in [place]  Chief Complaint: BRBPR  HPI: Cassandra Allen is Cassandra Allen 72 y.o. female with medical history significant of recently diagnosed metastatic colon cancer, type 2 diabetes, hypertension, atrial fibrillation on Eliquis and multiple other medical problems who presents with bright red blood per rectum.  She notes he presented due to bright red blood per rectum.  She notes that on this past Thursday she was constipated.  She took several laxatives and stool softeners.  When she had Perian Tedder bowel movement yesterday she noticed clots and bright red blood per rectum every time she passed stool.  She is otherwise asymptomatic and denies lightheadedness, dizziness, chest pain, shortness of breath, fevers, chills, abdominal pain, nausea, vomiting, diarrhea.  She notes that she came to the hospital for Kelle Ruppert transfusion.  She does not feel like she needs to stay any longer.  ED Course: [facility], 1 unit packed red blood cells, labs, EKG, imaging.  Admit to hospitalist for observation.  GI was consulted.  Review of Systems: As per HPI otherwise 10 point review of systems negative.   Past Medical History:  Diagnosis Date  . Arthritis   . Cancer ([place])   . Diabetes mellitus without complication ([place])   . Hypertension     History reviewed. No pertinent surgical history.   reports that she has never smoked. She has never used smokeless tobacco. She reports that she does not drink alcohol or use drugs.  Allergies  Allergen Reactions  . Metformin And Related Nausea And Vomiting  . Prednisone Nausea And Vomiting    No family history on file.  Prior to Admission medications   Medication Sig Start Date End Date Taking? Authorizing Provider  apixaban (ELIQUIS) 5 MG TABS tablet TAKE 1  TABLET (5 MG TOTAL) BY MOUTH 2 (TWO) TIMES DAILY. 09/19/18  Yes [name], NP  beta carotene w/minerals (OCUVITE) tablet Take 1 tablet by mouth daily at 12 noon.   Yes [provider]  cholecalciferol (VITAMIN D) 1000 units tablet Take 1,000 Units by mouth at bedtime.   Yes [provider]  diltiazem (CARDIZEM CD) 120 MG 24 hr capsule Take 1 capsule (120 mg total) by mouth 2 (two) times daily. 11/22/17  Yes [name], NP  diphenoxylate-atropine (LOMOTIL) 2.5-0.025 MG tablet Take 1 to 2 tablets 4 times daily as needed 09/18/18  Yes Alla Feeling, NP  ferrous sulfate 325 (65 FE) MG EC tablet Take 325 mg by mouth daily with breakfast.    Yes [provider]  HYDROcodone-acetaminophen (NORCO) 7.5-325 MG tablet Take 1 tablet by mouth 2 (two) times daily as needed (for pain.).    Yes [provider]  Insulin Detemir (LEVEMIR FLEXTOUCH) 100 UNIT/ML Pen Inject 10 Units into the skin daily.   Yes [provider]  liraglutide (VICTOZA) 18 MG/3ML SOPN Inject 1.8 mg into the skin every morning.   Yes [provider]  losartan-hydrochlorothiazide (HYZAAR) 100-12.5 MG tablet Take 1 tablet by mouth daily.    Yes [provider]  Multiple Vitamin (MULTIVITAMIN WITH MINERALS) TABS tablet Take 2 tablets by mouth daily at 12 noon.    Yes [provider]  oxybutynin (DITROPAN) 5 MG tablet Take 20 mg by mouth daily.   Yes [provider]  oxybutynin (OXYTROL) 3.9  MG/24HR Place 1 patch onto the skin 2 (two) times Jeannia Tatro week.   Yes [provider]  sertraline (ZOLOFT) 100 MG tablet Take 100 mg by mouth daily at 12 noon.    Yes [provider]  simethicone (MYLICON) 80 MG chewable tablet Chew 80-160 mg by mouth 2 (two) times daily as needed for flatulence.  02/23/15  Yes [provider]  simvastatin (ZOCOR) 20 MG tablet Take 20 mg by mouth daily at 10 pm.  02/28/15  Yes [provider]    Physical  Exam: Vitals:   11/02/18 1605 11/02/18 1606 11/02/18 1703 11/02/18 1704  BP: 128/68 128/68 125/66 125/66  Pulse: (!) 101 81 71 80  Resp: 16  14   Temp: 98.2 F (36.8 C)  98.3 F (36.8 C) 98.8 F (37.1 C)  TempSrc: Oral  Oral Oral  SpO2:  96% 100% 100%  Weight:   77.1 kg   Height:   5\' 3"  (1.6 m)     Constitutional: NAD, calm, comfortable Vitals:   11/02/18 1605 11/02/18 1606 11/02/18 1703 11/02/18 1704  BP: 128/68 128/68 125/66 125/66  Pulse: (!) 101 81 71 80  Resp: 16  14   Temp: 98.2 F (36.8 C)  98.3 F (36.8 C) 98.8 F (37.1 C)  TempSrc: Oral  Oral Oral  SpO2:  96% 100% 100%  Weight:   77.1 kg   Height:   5\' 3"  (1.6 m)    Eyes: PERRL, lids and conjunctivae normal ENMT: Mucous membranes are moist. Neck: normal, supple, no masses, no thyromegaly Respiratory: clear to auscultation bilaterally Cardiovascular: Regular rate and rhythm, no murmurs / rubs / gallops.  Abdomen: no tenderness, no masses palpated. No hepatosplenomegaly. Bowel sounds positive.  Musculoskeletal: no clubbing / cyanosis. No joint deformity upper and lower extremities. Good ROM, no contractures. Normal muscle tone.  Skin: no rashes, lesions, ulcers. No induration Neurologic: CN 2-12 grossly intact. Sensation intact. Moving all extremities. Psychiatric: Normal judgment and insight. Alert and oriented x 3. Normal mood.   Labs on Admission: I have personally reviewed following labs and imaging studies  CBC: Recent Labs  Lab 10/30/18 1630 11/02/18 1206  WBC 8.2 8.3  NEUTROABS 7.6  --   HGB 8.7* 7.0*  HCT 29.7* 25.9*  MCV 101.4* 107.9*  PLT 210 829   Basic Metabolic Panel: Recent Labs  Lab 10/30/18 1630 11/02/18 1206  NA 143 142  K 4.2 3.9  CL 109 110  CO2 25 23  GLUCOSE 209* 118*  BUN 20 34*  CREATININE 1.07* 1.08*  CALCIUM 8.5* 8.0*   GFR: Estimated Creatinine Clearance: 47 mL/min (Sheralyn Pinegar) (by C-G formula based on SCr of 1.08 mg/dL (H)). Liver Function Tests: Recent Labs  Lab  10/30/18 1630 11/02/18 1206  AST 7* 11*  ALT 9 12  ALKPHOS 51 34*  BILITOT 0.3 0.7  PROT 5.6* 4.9*  ALBUMIN 3.2* 3.1*   No results for input(s): LIPASE, AMYLASE in the last 168 hours. No results for input(s): AMMONIA in the last 168 hours. Coagulation Profile: Recent Labs  Lab 11/02/18 1209  INR 2.20   Cardiac Enzymes: No results for input(s): CKTOTAL, CKMB, CKMBINDEX, TROPONINI in the last 168 hours. BNP (last 3 results) No results for input(s): PROBNP in the last 8760 hours. HbA1C: No results for input(s): HGBA1C in the last 72 hours. CBG: Recent Labs  Lab 11/02/18 1709  GLUCAP 109*   Lipid Profile: No results for input(s): CHOL, HDL, LDLCALC, TRIG, CHOLHDL, LDLDIRECT in the last 72  hours. Thyroid Function Tests: No results for input(s): TSH, T4TOTAL, FREET4, T3FREE, THYROIDAB in the last 72 hours. Anemia Panel: No results for input(s): VITAMINB12, FOLATE, FERRITIN, TIBC, IRON, RETICCTPCT in the last 72 hours. Urine analysis:    Component Value Date/Time   COLORURINE YELLOW 03/25/2015 2317   APPEARANCEUR CLOUDY (Kenzlie Disch) 03/25/2015 2317   LABSPEC 1.014 03/25/2015 2317   PHURINE 5.0 03/25/2015 2317   GLUCOSEU NEGATIVE 03/25/2015 2317   HGBUR NEGATIVE 03/25/2015 2317   BILIRUBINUR NEGATIVE 03/25/2015 2317   KETONESUR NEGATIVE 03/25/2015 2317   PROTEINUR NEGATIVE 03/25/2015 2317   UROBILINOGEN 0.2 03/25/2015 2317   NITRITE POSITIVE (Verline Kong) 03/25/2015 2317   LEUKOCYTESUR MODERATE (V[place] Rossin) 03/25/2015 2317    Radiological Exams on Admission: Ct Chest W Contrast  Result Date: 11/01/2018 CLINICAL DATA:  72 year old female with colonic mass is seen on colonoscopy 1 week ago. Anemia in GI bleed for 6 months. No prior history of cancer. Initial encounter. EXAM: CT CHEST, ABDOMEN, AND PELVIS WITH CONTRAST TECHNIQUE: Multidetector CT imaging of the chest, abdomen and pelvis was performed following the standard protocol during bolus administration of intravenous contrast. CONTRAST:   130mL OMNIPAQUE IOHEXOL 300 MG/ML  SOLN COMPARISON:  05/10/2018 chest x-ray.  No comparison CT. FINDINGS: CT CHEST FINDINGS Cardiovascular: Cardiomegaly. Prominent epicardial fat. Coronary artery calcification. Calcified aortic and mitral valve. Atherosclerotic changes aorta and great vessels. Ascending thoracic aorta measures up to 3.2 cm. No central pulmonary embolus noted. Mediastinum/Nodes: Abnormal appearance of the thyroid gland with left lower lobe ill-defined mass spanning over 3 cm. Right paratracheal adenopathy (immediately superior to the azygos vein) which short axis dimension of 1.2 cm. Aortic pulmonary window top-normal size lymph node. Lungs/Pleura: Small number of calcified granulomas. Scattered areas of scarring/subsegmental atelectasis. Scattered lungs cyst. No focal mass suspicious for pulmonary metastatic disease. Lung base atelectasis. Trachea and mainstem bronchi are patent. Musculoskeletal: Degenerative changes thoracic spine. No osseous destructive lesion. CT ABDOMEN PELVIS FINDINGS Hepatobiliary: Numerous hepatic lesions suspicious for metastatic disease largest within the caudate lobe measuring up to 3.2 cm. Liver top-normal size. Gallbladder wall thickening. This may be related to increased right heart pressure or liver disease but could not exclude cholecystitis in the proper clinical setting. Slight haziness porta hepatis region. Pancreas: No worrisome pancreatic lesion. Spleen: Numerous splenic lesions which in the present clinical setting is suspicious for combination of metastatic disease and splenic cysts. Adrenals/Urinary Tract: No obstructing stone or hydronephrosis. Renal low-density structures too small to characterize possibly cyst. 3.2 cm left adrenal mass suspicious for metastatic disease. No right adrenal mass. Gas within the urinary bladder may be related to recent manipulation. No adjacent fistula identified. Stomach/Bowel: Question sigmoid colon and possibly proximal  descending colon mass. Rectosigmoid colon mass also not excluded. Correlation with colonoscopy results recommended. Prominent number of colonic diverticula. Slight third spacing of fluid makes it difficult to evaluate for the possibility of diverticulitis. Radiopaque 1.5 cm structure within the right colon may be related to ingested foreign body. Under distended stomach without gross abnormality. Vascular/Lymphatic: Atherosclerotic changes aorta and aortic branch vessels without abdominal aortic aneurysm or large vessel occlusion. Low-density fatty appearing structures in the external iliac region/pelvic sidewall bilaterally with adjacent low-density iliac lymph nodes and retroperitoneal lymph nodes raises possibility of low-density metastatic adenopathy. Prominent size portacaval lymph node which short axis dimension of 1.3 cm. Reproductive: Left adnexal 2.4 cm cyst. Other: Third spacing of fluid. No free air. Lax abdominal musculature without bowel containing hernia. Musculoskeletal: Degenerative changes lumbar spine most notable L4-5 and L5-S1  with 5 mm anterior slip L4 secondary to facet degenerative changes. Hip joint degenerative changes. No osseous destructive lesion. IMPRESSION: CT CHEST: 1. Right paratracheal adenopathy (immediately superior to the azygos vein) which short axis dimension of 1.2 cm. In the present clinical setting it is possible this is related to metastatic involvement. 2. Scattered pulmonary parenchymal changes none of which are highly suspicious for metastatic disease. 3. Abnormal appearance of the thyroid gland with left lower lobe ill-defined mass spanning over 3 cm. Thyroid ultrasound can be performed for further delineation. 4. Cardiomegaly. Coronary artery calcification. 5.  Aortic Atherosclerosis (ICD10-I70.0). CT ABDOMEN PELVIS: 1. Numerous hepatic lesions suspicious for metastatic disease largest within the caudate lobe measuring up to 3.2 cm. 2. Gallbladder wall thickening. This  may be related to increased right heart pressure or liver disease but could not exclude cholecystitis in the proper clinical setting. 3. Numerous splenic lesions which in the present clinical setting is suspicious for combination of metastatic disease and splenic cysts. 4. 3.2 cm left adrenal mass suspicious for metastatic disease. 5. Question sigmoid colon and possibly proximal descending colon mass. Rectosigmoid colon mass also not excluded. Correlation with colonoscopy results recommended. 6. Prominent number of colonic diverticula. Third spacing of fluid makes it difficult to evaluate for the possibility of diverticulitis. Radiopaque 1.5 cm structure within the right colon may be related to ingested foreign body. 7. Low-density fatty appearing structures in the external iliac region/pelvic sidewall bilaterally with adjacent low-density iliac lymph nodes and retroperitoneal lymph nodes raises possibility of low-density metastatic adenopathy. Prominent size portacaval lymph node which short axis dimension of 1.3 cm. PET-CT could be obtained for further delineation if clinically desired. 8. Gas within the urinary bladder may be related to recent manipulation. Clinical correlation recommended. 9. Left adnexal 2.4 cm cyst. This can be assessed with pelvic sonogram. These results will be called to the ordering clinician or representative by the Radiologist Assistant, and communication documented in the PACS or zVision Dashboard. Electronically Signed   By: Genia Del M.D.   On: 11/01/2018 13:13   Ct Abdomen Pelvis W Contrast  Result Date: 11/01/2018 CLINICAL DATA:  72 year old female with colonic mass is seen on colonoscopy 1 week ago. Anemia in GI bleed for 6 months. No prior history of cancer. Initial encounter. EXAM: CT CHEST, ABDOMEN, AND PELVIS WITH CONTRAST TECHNIQUE: Multidetector CT imaging of the chest, abdomen and pelvis was performed following the standard protocol during bolus administration of  intravenous contrast. CONTRAST:  118mL OMNIPAQUE IOHEXOL 300 MG/ML  SOLN COMPARISON:  05/10/2018 chest x-ray.  No comparison CT. FINDINGS: CT CHEST FINDINGS Cardiovascular: Cardiomegaly. Prominent epicardial fat. Coronary artery calcification. Calcified aortic and mitral valve. Atherosclerotic changes aorta and great vessels. Ascending thoracic aorta measures up to 3.2 cm. No central pulmonary embolus noted. Mediastinum/Nodes: Abnormal appearance of the thyroid gland with left lower lobe ill-defined mass spanning over 3 cm. Right paratracheal adenopathy (immediately superior to the azygos vein) which short axis dimension of 1.2 cm. Aortic pulmonary window top-normal size lymph node. Lungs/Pleura: Small number of calcified granulomas. Scattered areas of scarring/subsegmental atelectasis. Scattered lungs cyst. No focal mass suspicious for pulmonary metastatic disease. Lung base atelectasis. Trachea and mainstem bronchi are patent. Musculoskeletal: Degenerative changes thoracic spine. No osseous destructive lesion. CT ABDOMEN PELVIS FINDINGS Hepatobiliary: Numerous hepatic lesions suspicious for metastatic disease largest within the caudate lobe measuring up to 3.2 cm. Liver top-normal size. Gallbladder wall thickening. This may be related to increased right heart pressure or liver disease but could not  exclude cholecystitis in the proper clinical setting. Slight haziness porta hepatis region. Pancreas: No worrisome pancreatic lesion. Spleen: Numerous splenic lesions which in the present clinical setting is suspicious for combination of metastatic disease and splenic cysts. Adrenals/Urinary Tract: No obstructing stone or hydronephrosis. Renal low-density structures too small to characterize possibly cyst. 3.2 cm left adrenal mass suspicious for metastatic disease. No right adrenal mass. Gas within the urinary bladder may be related to recent manipulation. No adjacent fistula identified. Stomach/Bowel: Question sigmoid  colon and possibly proximal descending colon mass. Rectosigmoid colon mass also not excluded. Correlation with colonoscopy results recommended. Prominent number of colonic diverticula. Slight third spacing of fluid makes it difficult to evaluate for the possibility of diverticulitis. Radiopaque 1.5 cm structure within the right colon may be related to ingested foreign body. Under distended stomach without gross abnormality. Vascular/Lymphatic: Atherosclerotic changes aorta and aortic branch vessels without abdominal aortic aneurysm or large vessel occlusion. Low-density fatty appearing structures in the external iliac region/pelvic sidewall bilaterally with adjacent low-density iliac lymph nodes and retroperitoneal lymph nodes raises possibility of low-density metastatic adenopathy. Prominent size portacaval lymph node which short axis dimension of 1.3 cm. Reproductive: Left adnexal 2.4 cm cyst. Other: Third spacing of fluid. No free air. Lax abdominal musculature without bowel containing hernia. Musculoskeletal: Degenerative changes lumbar spine most notable L4-5 and L5-S1 with 5 mm anterior slip L4 secondary to facet degenerative changes. Hip joint degenerative changes. No osseous destructive lesion. IMPRESSION: CT CHEST: 1. Right paratracheal adenopathy (immediately superior to the azygos vein) which short axis dimension of 1.2 cm. In the present clinical setting it is possible this is related to metastatic involvement. 2. Scattered pulmonary parenchymal changes none of which are highly suspicious for metastatic disease. 3. Abnormal appearance of the thyroid gland with left lower lobe ill-defined mass spanning over 3 cm. Thyroid ultrasound can be performed for further delineation. 4. Cardiomegaly. Coronary artery calcification. 5.  Aortic Atherosclerosis (ICD10-I70.0). CT ABDOMEN PELVIS: 1. Numerous hepatic lesions suspicious for metastatic disease largest within the caudate lobe measuring up to 3.2 cm. 2.  Gallbladder wall thickening. This may be related to increased right heart pressure or liver disease but could not exclude cholecystitis in the proper clinical setting. 3. Numerous splenic lesions which in the present clinical setting is suspicious for combination of metastatic disease and splenic cysts. 4. 3.2 cm left adrenal mass suspicious for metastatic disease. 5. Question sigmoid colon and possibly proximal descending colon mass. Rectosigmoid colon mass also not excluded. Correlation with colonoscopy results recommended. 6. Prominent number of colonic diverticula. Third spacing of fluid makes it difficult to evaluate for the possibility of diverticulitis. Radiopaque 1.5 cm structure within the right colon may be related to ingested foreign body. 7. Low-density fatty appearing structures in the external iliac region/pelvic sidewall bilaterally with adjacent low-density iliac lymph nodes and retroperitoneal lymph nodes raises possibility of low-density metastatic adenopathy. Prominent size portacaval lymph node which short axis dimension of 1.3 cm. PET-CT could be obtained for further delineation if clinically desired. 8. Gas within the urinary bladder may be related to recent manipulation. Clinical correlation recommended. 9. Left adnexal 2.4 cm cyst. This can be assessed with pelvic sonogram. These results will be called to the ordering clinician or representative by the Radiologist Assistant, and communication documented in the PACS or zVision Dashboard. Electronically Signed   By: Genia Del M.D.   On: 11/01/2018 13:13   Dg Chest Port 1 View  Result Date: 11/02/2018 CLINICAL DATA:  Colon cancer diagnosed 3  days ago by colonoscopy, drank contrast for Alice Burnside CT exam and then had Blyss Lugar bloody bowel movement EXAM: PORTABLE CHEST 1 VIEW COMPARISON:  Portable exam 1153 hours compared to 05/10/2018 FINDINGS: Enlargement of cardiac silhouette. Atherosclerotic calcification aorta. Mediastinal contours and pulmonary  vascularity normal. Lungs clear. No infiltrate, pleural effusion, or pneumothorax. Bones demineralized. IMPRESSION: Enlargement of cardiac silhouette. No acute abnormalities. Electronically Signed   By: Lavonia Dana M.D.   On: 11/02/2018 13:01    EKG: Independently reviewed. Atrial fibrillation  Assessment/Plan Active Problems:   GI bleed  Acute Blood Loss Anemia  GI Bleed: Pt on eliquis for afib.  Recently had colonoscopy and polypectomies 8 days ago.   S/p 1 unit pRBC S/p kcentra by ED Hold eliquis  GI c/s, appreciate recs - suspects unlikely she'll need colonoscopy.  Follow closely.   Will need to discuss with GI recommendations for eliquis long term  Atrial Fibrillation: on dilt.  Holding eliquis.  Chadsvasc at least 4. Continue to hold eliquis, would discuss with GI recommendations long term in setting of GI bleed above  HTN: hold HCTZ/losartan, continue dilt  T2DM: hold levemir, continue SSI  Elevated INR: 2.2, likely related to eliquis. Continue to follow in AM  Metastatic Colon Cancer: following up with Dr. Krista Blue.  Planning for liver biopsy.   Follow up outpatient  HLD: continue statin (note this was changed to lipitor due to potential interactions between dilt and simvastatin - would change at discharge as well)  Urinary Retention: continue oxybutinin  Depression: continue zoloft  DVT prophylaxis: SCD  Code Status: full  Family Communication: son at bedside  Disposition Plan: observation for 24 hours to ensure bleeding resolves and H/H stable  Consults called: GI  Admission status: obs    Fayrene Helper MD [facility] Pager [phone]  If 7PM-7AM, please contact night-coverage [email or web address] Password [facility]  11/02/2018, 8:04 PM

## 2018-11-02 NOTE — Consult Note (Signed)
Referring Provider:  Dr. [name] Primary Care Physician:  [name], MD Primary Gastroenterologist:  Dr. [name]  Reason for Consultation: Recurrent hematochezia, post polypectomy, on Eliquis  Pleasant 72 year old female with recently diagnosed colon cancer, probably metastatic to the liver, comes in today because of a 24-hour history of recurrent hematochezia.  The patient underwent colonoscopy by Dr. [name] 8 days ago because of iron deficiency anemia, and it showed 2 neoplastic lesions in the rectum and left colon, at least 1 of them cancerous on biopsies.  In addition, the patient had approximately 6 polyps removed by hot snare technique, the largest 20 mm in diameter.  These were scattered in both the proximal and distal portion of the colon.  The patient is maintained on Eliquis because of atrial fibrillation, and restarted it 2 days post procedure.  She then did fine for an additional 5 days until yesterday, when she took laxatives because she had not had a bowel movement.  She had an explosive bowel movement that had blood with it, and since then, had 2 subsequent bloody bowel movements, most recently at 6:00 this morning.  She has therefore gone almost 12 hours without any further bleeding.  In the emergency room, her hemoglobin was 7.0, as compared to a baseline in the 8-9 range (it was 8.7 when checked 3 days earlier).  A reversing agent was administered for her Eliquis, and she was transfused 1 unit of packed cells.  At this time, the patient feels fine.  She never had abdominal pain or presyncopal symptoms.     Past Medical History:  Diagnosis Date  . Arthritis   . Cancer ([place])   . Diabetes mellitus without complication ([place])   . Hypertension     History reviewed. No pertinent surgical history.  Prior to Admission medications   Medication Sig Start Date End Date Taking? Authorizing Provider  apixaban (ELIQUIS) 5 MG TABS tablet TAKE 1 TABLET (5 MG TOTAL) BY MOUTH 2  (TWO) TIMES DAILY. 09/19/18  Yes [name], NP  beta carotene w/minerals (OCUVITE) tablet Take 1 tablet by mouth daily at 12 noon.   Yes [provider]  cholecalciferol (VITAMIN D) 1000 units tablet Take 1,000 Units by mouth at bedtime.   Yes [provider]  diltiazem (CARDIZEM CD) 120 MG 24 hr capsule Take 1 capsule (120 mg total) by mouth 2 (two) times daily. 11/22/17  Yes [name], NP  diphenoxylate-atropine (LOMOTIL) 2.5-0.025 MG tablet Take 1 to 2 tablets 4 times daily as needed 09/18/18  Yes Alla Feeling, NP  ferrous sulfate 325 (65 FE) MG EC tablet Take 325 mg by mouth daily with breakfast.    Yes [provider]  HYDROcodone-acetaminophen (NORCO) 7.5-325 MG tablet Take 1 tablet by mouth 2 (two) times daily as needed (for pain.).    Yes [provider]  Insulin Detemir (LEVEMIR FLEXTOUCH) 100 UNIT/ML Pen Inject 10 Units into the skin daily.   Yes [provider]  liraglutide (VICTOZA) 18 MG/3ML SOPN Inject 1.8 mg into the skin every morning.   Yes [provider]  losartan-hydrochlorothiazide (HYZAAR) 100-12.5 MG tablet Take 1 tablet by mouth daily.    Yes [provider]  Multiple Vitamin (MULTIVITAMIN WITH MINERALS) TABS tablet Take 2 tablets by mouth daily at 12 noon.    Yes [provider]  oxybutynin (DITROPAN) 5 MG tablet Take 20 mg by mouth daily.   Yes [provider]  oxybutynin (OXYTROL) 3.9 MG/24HR Place 1 patch  onto the skin 2 (two) times a week.   Yes [provider]  sertraline (ZOLOFT) 100 MG tablet Take 100 mg by mouth daily at 12 noon.    Yes [provider]  simethicone (MYLICON) 80 MG chewable tablet Chew 80-160 mg by mouth 2 (two) times daily as needed for flatulence.  02/23/15  Yes [provider]  simvastatin (ZOCOR) 20 MG tablet Take 20 mg by mouth daily at 10 pm.  02/28/15  Yes [provider]    Current Facility-Administered Medications   Medication Dose Route Frequency Provider Last Rate Last Dose  . 0.9 %  sodium chloride infusion  10 mL/hr Intravenous Once [name], MD       Current Outpatient Medications  Medication Sig Dispense Refill  . apixaban (ELIQUIS) 5 MG TABS tablet TAKE 1 TABLET (5 MG TOTAL) BY MOUTH 2 (TWO) TIMES DAILY. 180 tablet 2  . beta carotene w/minerals (OCUVITE) tablet Take 1 tablet by mouth daily at 12 noon.    . cholecalciferol (VITAMIN D) 1000 units tablet Take 1,000 Units by mouth at bedtime.    Marland Kitchen diltiazem (CARDIZEM CD) 120 MG 24 hr capsule Take 1 capsule (120 mg total) by mouth 2 (two) times daily. 180 capsule 3  . diphenoxylate-atropine (LOMOTIL) 2.5-0.025 MG tablet Take 1 to 2 tablets 4 times daily as needed 40 tablet 3  . ferrous sulfate 325 (65 FE) MG EC tablet Take 325 mg by mouth daily with breakfast.     . HYDROcodone-acetaminophen (NORCO) 7.5-325 MG tablet Take 1 tablet by mouth 2 (two) times daily as needed (for pain.).     . Insulin Detemir (LEVEMIR FLEXTOUCH) 100 UNIT/ML Pen Inject 10 Units into the skin daily.    Marland Kitchen liraglutide (VICTOZA) 18 MG/3ML SOPN Inject 1.8 mg into the skin every morning.    Marland Kitchen losartan-hydrochlorothiazide (HYZAAR) 100-12.5 MG tablet Take 1 tablet by mouth daily.     . Multiple Vitamin (MULTIVITAMIN WITH MINERALS) TABS tablet Take 2 tablets by mouth daily at 12 noon.     Marland Kitchen oxybutynin (DITROPAN) 5 MG tablet Take 20 mg by mouth daily.    Marland Kitchen oxybutynin (OXYTROL) 3.9 MG/24HR Place 1 patch onto the skin 2 (two) times a week.    . sertraline (ZOLOFT) 100 MG tablet Take 100 mg by mouth daily at 12 noon.     . simethicone (MYLICON) 80 MG chewable tablet Chew 80-160 mg by mouth 2 (two) times daily as needed for flatulence.     . simvastatin (ZOCOR) 20 MG tablet Take 20 mg by mouth daily at 10 pm.       Allergies as of 11/02/2018 - Review Complete 11/02/2018  Allergen Reaction Noted  . Metformin and related Nausea And Vomiting 03/27/2016  . Prednisone Nausea And  Vomiting 03/27/2016    No family history on file.  Social History   Socioeconomic History  . Marital status: Divorced    Spouse name: Not on file  . Number of children: 2  . Years of education: Not on file  . Highest education level: Not on file  Occupational History  . Not on file  Social Needs  . Financial resource strain: Not on file  . Food insecurity:    Worry: Not on file    Inability: Not on file  . Transportation needs:    Medical: Not on file    Non-medical: Not on file  Tobacco Use  . Smoking status: Never Smoker  . Smokeless tobacco: Never Used  Substance and Sexual Activity  . Alcohol use: No  . Drug use: No  . Sexual activity: Not on file  Lifestyle  . Physical activity:    Days per week: Not on file    Minutes per session: Not on file  . Stress: Not on file  Relationships  . Social connections:    Talks on phone: Not on file    Gets together: Not on file    Attends religious service: Not on file    Active member of club or organization: Not on file    Attends meetings of clubs or organizations: Not on file    Relationship status: Not on file  . Intimate partner violence:    Fear of current or ex partner: Not on file    Emotionally abused: Not on file    Physically abused: Not on file    Forced sexual activity: Not on file  Other Topics Concern  . Not on file  Social History Narrative  . Not on file    Review of Systems: See HPI  Physical Exam: Vital signs in last 24 hours: Temp:  [98.2 F (36.8 C)-99 F (37.2 C)] 98.2 F (36.8 C) (12/21 1605) Pulse Rate:  [72-102] 81 (12/21 1606) Resp:  [16-18] 16 (12/21 1605) BP: (115-132)/(62-80) 128/68 (12/21 1606) SpO2:  [93 %-99 %] 96 % (12/21 1606) Weight:  [77.1 kg] 77.1 kg (12/21 1210)   This is a very pleasant, well-groomed female sitting up in a chair next to the ER stretcher, in absolutely no distress.  Vital signs are unremarkable.  The skin is warm and dry.  She is alert, coherent, and  appropriate, without any focal neurologic deficits.  Chest clear, heart has irregular rhythm consistent with known atrial fibrillation, rate perhaps 100.  Abdomen somewhat adipose.  She has chronic lower extremity edema which is nonpitting.  No overt pallor despite her low hemoglobin.  Intake/Output from previous day: No intake/output data recorded. Intake/Output this shift: Total I/O In: 1115 [I.V.:500; Blood:615] Out: -   Lab Results: Recent Labs    11/02/18 1206  WBC 8.3  HGB 7.0*  HCT 25.9*  PLT 209   BMET Recent Labs    11/02/18 1206  NA 142  K 3.9  CL 110  CO2 23  GLUCOSE 118*  BUN 34*  CREATININE 1.08*  CALCIUM 8.0*   LFT Recent Labs    11/02/18 1206  PROT 4.9*  ALBUMIN 3.1*  AST 11*  ALT 12  ALKPHOS 34*  BILITOT 0.7   PT/INR Recent Labs    11/02/18 1209  LABPROT 24.1*  INR 2.20    Studies/Results: Ct Chest W Contrast  Result Date: 11/01/2018 CLINICAL DATA:  73 year old female with colonic mass is seen on colonoscopy 1 week ago. Anemia in GI bleed for 6 months. No prior history of cancer. Initial encounter. EXAM: CT CHEST, ABDOMEN, AND PELVIS WITH CONTRAST TECHNIQUE: Multidetector CT imaging of the chest, abdomen and pelvis was performed following the standard protocol during bolus administration of intravenous contrast. CONTRAST:  149mL OMNIPAQUE IOHEXOL 300 MG/ML  SOLN COMPARISON:  05/10/2018 chest x-ray.  No comparison CT. FINDINGS: CT CHEST FINDINGS Cardiovascular: Cardiomegaly. Prominent epicardial fat. Coronary artery calcification. Calcified aortic and mitral valve. Atherosclerotic changes aorta and great vessels. Ascending thoracic aorta measures up to 3.2 cm. No central pulmonary embolus noted. Mediastinum/Nodes: Abnormal appearance of the thyroid gland with left lower lobe ill-defined mass spanning over 3 cm. Right paratracheal adenopathy (immediately superior to the azygos vein) which  short axis dimension of 1.2 cm. Aortic pulmonary window  top-normal size lymph node. Lungs/Pleura: Small number of calcified granulomas. Scattered areas of scarring/subsegmental atelectasis. Scattered lungs cyst. No focal mass suspicious for pulmonary metastatic disease. Lung base atelectasis. Trachea and mainstem bronchi are patent. Musculoskeletal: Degenerative changes thoracic spine. No osseous destructive lesion. CT ABDOMEN PELVIS FINDINGS Hepatobiliary: Numerous hepatic lesions suspicious for metastatic disease largest within the caudate lobe measuring up to 3.2 cm. Liver top-normal size. Gallbladder wall thickening. This may be related to increased right heart pressure or liver disease but could not exclude cholecystitis in the proper clinical setting. Slight haziness porta hepatis region. Pancreas: No worrisome pancreatic lesion. Spleen: Numerous splenic lesions which in the present clinical setting is suspicious for combination of metastatic disease and splenic cysts. Adrenals/Urinary Tract: No obstructing stone or hydronephrosis. Renal low-density structures too small to characterize possibly cyst. 3.2 cm left adrenal mass suspicious for metastatic disease. No right adrenal mass. Gas within the urinary bladder may be related to recent manipulation. No adjacent fistula identified. Stomach/Bowel: Question sigmoid colon and possibly proximal descending colon mass. Rectosigmoid colon mass also not excluded. Correlation with colonoscopy results recommended. Prominent number of colonic diverticula. Slight third spacing of fluid makes it difficult to evaluate for the possibility of diverticulitis. Radiopaque 1.5 cm structure within the right colon may be related to ingested foreign body. Under distended stomach without gross abnormality. Vascular/Lymphatic: Atherosclerotic changes aorta and aortic branch vessels without abdominal aortic aneurysm or large vessel occlusion. Low-density fatty appearing structures in the external iliac region/pelvic sidewall bilaterally  with adjacent low-density iliac lymph nodes and retroperitoneal lymph nodes raises possibility of low-density metastatic adenopathy. Prominent size portacaval lymph node which short axis dimension of 1.3 cm. Reproductive: Left adnexal 2.4 cm cyst. Other: Third spacing of fluid. No free air. Lax abdominal musculature without bowel containing hernia. Musculoskeletal: Degenerative changes lumbar spine most notable L4-5 and L5-S1 with 5 mm anterior slip L4 secondary to facet degenerative changes. Hip joint degenerative changes. No osseous destructive lesion. IMPRESSION: CT CHEST: 1. Right paratracheal adenopathy (immediately superior to the azygos vein) which short axis dimension of 1.2 cm. In the present clinical setting it is possible this is related to metastatic involvement. 2. Scattered pulmonary parenchymal changes none of which are highly suspicious for metastatic disease. 3. Abnormal appearance of the thyroid gland with left lower lobe ill-defined mass spanning over 3 cm. Thyroid ultrasound can be performed for further delineation. 4. Cardiomegaly. Coronary artery calcification. 5.  Aortic Atherosclerosis (ICD10-I70.0). CT ABDOMEN PELVIS: 1. Numerous hepatic lesions suspicious for metastatic disease largest within the caudate lobe measuring up to 3.2 cm. 2. Gallbladder wall thickening. This may be related to increased right heart pressure or liver disease but could not exclude cholecystitis in the proper clinical setting. 3. Numerous splenic lesions which in the present clinical setting is suspicious for combination of metastatic disease and splenic cysts. 4. 3.2 cm left adrenal mass suspicious for metastatic disease. 5. Question sigmoid colon and possibly proximal descending colon mass. Rectosigmoid colon mass also not excluded. Correlation with colonoscopy results recommended. 6. Prominent number of colonic diverticula. Third spacing of fluid makes it difficult to evaluate for the possibility of  diverticulitis. Radiopaque 1.5 cm structure within the right colon may be related to ingested foreign body. 7. Low-density fatty appearing structures in the external iliac region/pelvic sidewall bilaterally with adjacent low-density iliac lymph nodes and retroperitoneal lymph nodes raises possibility of low-density metastatic adenopathy. Prominent size portacaval lymph node which short axis dimension  of 1.3 cm. PET-CT could be obtained for further delineation if clinically desired. 8. Gas within the urinary bladder may be related to recent manipulation. Clinical correlation recommended. 9. Left adnexal 2.4 cm cyst. This can be assessed with pelvic sonogram. These results will be called to the ordering clinician or representative by the Radiologist Assistant, and communication documented in the PACS or zVision Dashboard. Electronically Signed   By: Genia Del M.D.   On: 11/01/2018 13:13   Ct Abdomen Pelvis W Contrast  Result Date: 11/01/2018 CLINICAL DATA:  72 year old female with colonic mass is seen on colonoscopy 1 week ago. Anemia in GI bleed for 6 months. No prior history of cancer. Initial encounter. EXAM: CT CHEST, ABDOMEN, AND PELVIS WITH CONTRAST TECHNIQUE: Multidetector CT imaging of the chest, abdomen and pelvis was performed following the standard protocol during bolus administration of intravenous contrast. CONTRAST:  155mL OMNIPAQUE IOHEXOL 300 MG/ML  SOLN COMPARISON:  05/10/2018 chest x-ray.  No comparison CT. FINDINGS: CT CHEST FINDINGS Cardiovascular: Cardiomegaly. Prominent epicardial fat. Coronary artery calcification. Calcified aortic and mitral valve. Atherosclerotic changes aorta and great vessels. Ascending thoracic aorta measures up to 3.2 cm. No central pulmonary embolus noted. Mediastinum/Nodes: Abnormal appearance of the thyroid gland with left lower lobe ill-defined mass spanning over 3 cm. Right paratracheal adenopathy (immediately superior to the azygos vein) which short axis  dimension of 1.2 cm. Aortic pulmonary window top-normal size lymph node. Lungs/Pleura: Small number of calcified granulomas. Scattered areas of scarring/subsegmental atelectasis. Scattered lungs cyst. No focal mass suspicious for pulmonary metastatic disease. Lung base atelectasis. Trachea and mainstem bronchi are patent. Musculoskeletal: Degenerative changes thoracic spine. No osseous destructive lesion. CT ABDOMEN PELVIS FINDINGS Hepatobiliary: Numerous hepatic lesions suspicious for metastatic disease largest within the caudate lobe measuring up to 3.2 cm. Liver top-normal size. Gallbladder wall thickening. This may be related to increased right heart pressure or liver disease but could not exclude cholecystitis in the proper clinical setting. Slight haziness porta hepatis region. Pancreas: No worrisome pancreatic lesion. Spleen: Numerous splenic lesions which in the present clinical setting is suspicious for combination of metastatic disease and splenic cysts. Adrenals/Urinary Tract: No obstructing stone or hydronephrosis. Renal low-density structures too small to characterize possibly cyst. 3.2 cm left adrenal mass suspicious for metastatic disease. No right adrenal mass. Gas within the urinary bladder may be related to recent manipulation. No adjacent fistula identified. Stomach/Bowel: Question sigmoid colon and possibly proximal descending colon mass. Rectosigmoid colon mass also not excluded. Correlation with colonoscopy results recommended. Prominent number of colonic diverticula. Slight third spacing of fluid makes it difficult to evaluate for the possibility of diverticulitis. Radiopaque 1.5 cm structure within the right colon may be related to ingested foreign body. Under distended stomach without gross abnormality. Vascular/Lymphatic: Atherosclerotic changes aorta and aortic branch vessels without abdominal aortic aneurysm or large vessel occlusion. Low-density fatty appearing structures in the external  iliac region/pelvic sidewall bilaterally with adjacent low-density iliac lymph nodes and retroperitoneal lymph nodes raises possibility of low-density metastatic adenopathy. Prominent size portacaval lymph node which short axis dimension of 1.3 cm. Reproductive: Left adnexal 2.4 cm cyst. Other: Third spacing of fluid. No free air. Lax abdominal musculature without bowel containing hernia. Musculoskeletal: Degenerative changes lumbar spine most notable L4-5 and L5-S1 with 5 mm anterior slip L4 secondary to facet degenerative changes. Hip joint degenerative changes. No osseous destructive lesion. IMPRESSION: CT CHEST: 1. Right paratracheal adenopathy (immediately superior to the azygos vein) which short axis dimension of 1.2 cm. In the present clinical  setting it is possible this is related to metastatic involvement. 2. Scattered pulmonary parenchymal changes none of which are highly suspicious for metastatic disease. 3. Abnormal appearance of the thyroid gland with left lower lobe ill-defined mass spanning over 3 cm. Thyroid ultrasound can be performed for further delineation. 4. Cardiomegaly. Coronary artery calcification. 5.  Aortic Atherosclerosis (ICD10-I70.0). CT ABDOMEN PELVIS: 1. Numerous hepatic lesions suspicious for metastatic disease largest within the caudate lobe measuring up to 3.2 cm. 2. Gallbladder wall thickening. This may be related to increased right heart pressure or liver disease but could not exclude cholecystitis in the proper clinical setting. 3. Numerous splenic lesions which in the present clinical setting is suspicious for combination of metastatic disease and splenic cysts. 4. 3.2 cm left adrenal mass suspicious for metastatic disease. 5. Question sigmoid colon and possibly proximal descending colon mass. Rectosigmoid colon mass also not excluded. Correlation with colonoscopy results recommended. 6. Prominent number of colonic diverticula. Third spacing of fluid makes it difficult to  evaluate for the possibility of diverticulitis. Radiopaque 1.5 cm structure within the right colon may be related to ingested foreign body. 7. Low-density fatty appearing structures in the external iliac region/pelvic sidewall bilaterally with adjacent low-density iliac lymph nodes and retroperitoneal lymph nodes raises possibility of low-density metastatic adenopathy. Prominent size portacaval lymph node which short axis dimension of 1.3 cm. PET-CT could be obtained for further delineation if clinically desired. 8. Gas within the urinary bladder may be related to recent manipulation. Clinical correlation recommended. 9. Left adnexal 2.4 cm cyst. This can be assessed with pelvic sonogram. These results will be called to the ordering clinician or representative by the Radiologist Assistant, and communication documented in the PACS or zVision Dashboard. Electronically Signed   By: Genia Del M.D.   On: 11/01/2018 13:13   Dg Chest Port 1 View  Result Date: 11/02/2018 CLINICAL DATA:  Colon cancer diagnosed 3 days ago by colonoscopy, drank contrast for a CT exam and then had a bloody bowel movement EXAM: PORTABLE CHEST 1 VIEW COMPARISON:  Portable exam 1153 hours compared to 05/10/2018 FINDINGS: Enlargement of cardiac silhouette. Atherosclerotic calcification aorta. Mediastinal contours and pulmonary vascularity normal. Lungs clear. No infiltrate, pleural effusion, or pneumothorax. Bones demineralized. IMPRESSION: Enlargement of cardiac silhouette. No acute abnormalities. Electronically Signed   By: Lavonia Dana M.D.   On: 11/02/2018 13:01    Impression: 1.  Recurrent hematochezia, now quiescent, without hemodynamic instability 2.  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  Status post multiple polypectomies as described above, performed 8 days ago 4.  Newly diagnosed colon cancer, with evidence of metastatic disease in the liver on CT scanning  Discussion: The fact that the bleeding  occurred only following stimulation with laxatives makes me wonder whether or not this was a post polypectomy hemorrhage, which would otherwise be seemingly the most likely explanation for the bleeding.  Plan: Fortunately, it appears that the patient's bleeding has stopped.  In view of that, I feel we can follow a course of observation rather than intervention.  I think it is unlikely the patient will need a colonoscopy, so I would allow the patient a soft diet (have discussed with attending hospitalist physician) and monitor for recurrent hematochezia and/or further drop in hemoglobin, neither of which seems likely.  (In my experience, post polypectomy hemorrhage, once it stops, normally stays stopped.  This should especially be the case since the patient is no longer anticoagulated, following reversal of her Eliquis in the emergency room.)  I will order MiraLAX as sort of a gentle way to clean out any residual blood within the colon.  If the patient develops signs of recurrent hemorrhage, I would favor a more aggressive prep with colonoscopy to look for a focal site of bleeding, with the hope that it could be clipped or injected to achieve hemostasis.   LOS: 0 days   Cassandra Allen  11/02/2018, 4:40 PM   Pager [phone] If no answer or after 5 PM call [phone]

## 2018-11-02 NOTE — ED Triage Notes (Signed)
Pt had colonoscopy 3 days ago, was found to have colon cancer. Pt felt constipated so took miralax and stool softener. Pt then drank contrast for a CT scan and had a bloody bowel movement. Pt has noticed blood in her stool since. Pt has hx of anemia. Pt had iron infusion last week. Pt denies pain.

## 2018-11-02 NOTE — ED Notes (Signed)
ED TO INPATIENT HANDOFF REPORT  Name/Age/Gender Cassandra Allen 72 y.o. female  Code Status Code Status History    Date Active Date Inactive Code Status Order ID Comments User Context   03/26/2015 0002 03/27/2015 1625 Full Code [id number]  Allyne Gee, MD ED      Home/SNF/Other Home  Chief Complaint blood loss through colonoscopy  Level of Care/Admitting Diagnosis ED Disposition    ED Disposition Condition [facility] Area: [facility] [[id number]]  Level of Care: Telemetry [5]  Admit to tele based on following criteria: Other see comments  Comments: GI bleed  Diagnosis: GI bleed [[id number]]  Admitting Physician: Elodia Florence [id number]  Attending Physician: Cephus Slater, A CALDWELL [phone]  PT Class (Do Not Modify): Observation [104]  PT Acc Code (Do Not Modify): Observation [10022]       Medical History Past Medical History:  Diagnosis Date  . Arthritis   . Cancer ([place])   . Diabetes mellitus without complication ([place])   . Hypertension     Allergies Allergies  Allergen Reactions  . Metformin And Related Nausea And Vomiting  . Prednisone Nausea And Vomiting    IV Location/Drains/Wounds Patient Lines/Drains/Airways Status   Active Line/Drains/Airways    Name:   Placement date:   Placement time:   Site:   Days:   Peripheral IV 11/02/18 Left;Anterior Forearm   11/02/18    1200    Forearm   less than 1          Labs/Imaging Results for orders placed or performed during the hospital encounter of 11/02/18 (from the past 48 hour(s))  Comprehensive metabolic panel     Status: Abnormal   Collection Time: 11/02/18 12:06 PM  Result Value Ref Range   Sodium 142 135 - 145 mmol/L   Potassium 3.9 3.5 - 5.1 mmol/L   Chloride 110 98 - 111 mmol/L   CO2 23 22 - 32 mmol/L   Glucose, Bld 118 (H) 70 - 99 mg/dL   BUN 34 (H) 8 - 23 mg/dL   Creatinine, Ser 1.08 (H) 0.44 - 1.00 mg/dL   Calcium 8.0 (L) 8.9 - 10.3 mg/dL   Total  Protein 4.9 (L) 6.5 - 8.1 g/dL   Albumin 3.1 (L) 3.5 - 5.0 g/dL   AST 11 (L) 15 - 41 U/L   ALT 12 0 - 44 U/L   Alkaline Phosphatase 34 (L) 38 - 126 U/L   Total Bilirubin 0.7 0.3 - 1.2 mg/dL   GFR calc non Af Amer 52 (L) >60 mL/min   GFR calc Af Amer 60 (L) >60 mL/min   Anion gap 9 5 - 15    Comment: Performed at [facility], [place] [address] [place], [place] [address]  CBC     Status: Abnormal   Collection Time: 11/02/18 12:06 PM  Result Value Ref Range   WBC 8.3 4.0 - 10.5 K/uL   RBC 2.40 (L) 3.87 - 5.11 MIL/uL   Hemoglobin 7.0 (L) 12.0 - 15.0 g/dL   HCT 25.9 (L) 36.0 - 46.0 %   MCV 107.9 (H) 80.0 - 100.0 fL   MCH 29.2 26.0 - 34.0 pg   MCHC 27.0 (L) 30.0 - 36.0 g/dL   RDW 15.0 11.5 - 15.5 %   Platelets 209 150 - 400 K/uL   nRBC 0.0 0.0 - 0.2 %    Comment: Performed at [facility], [place] [address] [place], [place] [address]  Type and screen [place]     Status: None (Preliminary result)   Collection Time: 11/02/18 12:09 PM  Result Value Ref Range   ABO/RH(D) A POS    Antibody Screen NEG    Sample Expiration 11/05/2018    Unit Number [id number]    Blood Component Type RED CELLS,LR    Unit division 00    Status of Unit ISSUED    Transfusion Status OK TO TRANSFUSE    Crossmatch Result      Compatible Performed at [facility], [place] [address] [place], [place] [address]   Protime-INR     Status: Abnormal   Collection Time: 11/02/18 12:09 PM  Result Value Ref Range   Prothrombin Time 24.1 (H) 11.4 - 15.2 seconds   INR 2.20     Comment: Performed at [facility], [place] [address], [place], [place] [address]  APTT     Status: Abnormal   Collection Time: 11/02/18 12:09 PM  Result Value Ref Range   aPTT 37 (H) 24 - 36 seconds    Comment:        IF BASELINE aPTT IS ELEVATED, SUGGEST PATIENT RISK ASSESSMENT BE USED TO DETERMINE APPROPRIATE ANTICOAGULANT THERAPY. Performed at  [facility], [place] [address] [place], [place] [address]   ABO/Rh     Status: None (Preliminary result)   Collection Time: 11/02/18 12:09 PM  Result Value Ref Range   ABO/RH(D)      A POS Performed at [facility], [place] [address] [place], [place] [address]   Prepare RBC     Status: None   Collection Time: 11/02/18  1:03 PM  Result Value Ref Range   Order Confirmation      ORDER PROCESSED BY BLOOD BANK Performed at [place] [address], [place], [place] [address]    Ct Chest W Contrast  Result Date: 11/01/2018 CLINICAL DATA:  72 year old female with colonic mass is seen on colonoscopy 1 week ago. Anemia in GI bleed for 6 months. No prior history of cancer. Initial encounter. EXAM: CT CHEST, ABDOMEN, AND PELVIS WITH CONTRAST TECHNIQUE: Multidetector CT imaging of the chest, abdomen and pelvis was performed following the standard protocol during bolus administration of intravenous contrast. CONTRAST:  163mL OMNIPAQUE IOHEXOL 300 MG/ML  SOLN COMPARISON:  05/10/2018 chest x-ray.  No comparison CT. FINDINGS: CT CHEST FINDINGS Cardiovascular: Cardiomegaly. Prominent epicardial fat. Coronary artery calcification. Calcified aortic and mitral valve. Atherosclerotic changes aorta and great vessels. Ascending thoracic aorta measures up to 3.2 cm. No central pulmonary embolus noted. Mediastinum/Nodes: Abnormal appearance of the thyroid gland with left lower lobe ill-defined mass spanning over 3 cm. Right paratracheal adenopathy (immediately superior to the azygos vein) which short axis dimension of 1.2 cm. Aortic pulmonary window top-normal size lymph node. Lungs/Pleura: Small number of calcified granulomas. Scattered areas of scarring/subsegmental atelectasis. Scattered lungs cyst. No focal mass suspicious for pulmonary metastatic disease. Lung base atelectasis. Trachea and mainstem bronchi are patent. Musculoskeletal: Degenerative changes  thoracic spine. No osseous destructive lesion. CT ABDOMEN PELVIS FINDINGS Hepatobiliary: Numerous hepatic lesions suspicious for metastatic disease largest within the caudate lobe measuring up to 3.2 cm. Liver top-normal size. Gallbladder wall thickening. This may be related to increased right heart pressure or liver disease but could not exclude cholecystitis in the proper clinical setting. Slight haziness porta hepatis region. Pancreas: No worrisome pancreatic lesion. Spleen: Numerous splenic lesions which in the present clinical setting is suspicious for  combination of metastatic disease and splenic cysts. Adrenals/Urinary Tract: No obstructing stone or hydronephrosis. Renal low-density structures too small to characterize possibly cyst. 3.2 cm left adrenal mass suspicious for metastatic disease. No right adrenal mass. Gas within the urinary bladder may be related to recent manipulation. No adjacent fistula identified. Stomach/Bowel: Question sigmoid colon and possibly proximal descending colon mass. Rectosigmoid colon mass also not excluded. Correlation with colonoscopy results recommended. Prominent number of colonic diverticula. Slight third spacing of fluid makes it difficult to evaluate for the possibility of diverticulitis. Radiopaque 1.5 cm structure within the right colon may be related to ingested foreign body. Under distended stomach without gross abnormality. Vascular/Lymphatic: Atherosclerotic changes aorta and aortic branch vessels without abdominal aortic aneurysm or large vessel occlusion. Low-density fatty appearing structures in the external iliac region/pelvic sidewall bilaterally with adjacent low-density iliac lymph nodes and retroperitoneal lymph nodes raises possibility of low-density metastatic adenopathy. Prominent size portacaval lymph node which short axis dimension of 1.3 cm. Reproductive: Left adnexal 2.4 cm cyst. Other: Third spacing of fluid. No free air. Lax abdominal musculature  without bowel containing hernia. Musculoskeletal: Degenerative changes lumbar spine most notable L4-5 and L5-S1 with 5 mm anterior slip L4 secondary to facet degenerative changes. Hip joint degenerative changes. No osseous destructive lesion. IMPRESSION: CT CHEST: 1. Right paratracheal adenopathy (immediately superior to the azygos vein) which short axis dimension of 1.2 cm. In the present clinical setting it is possible this is related to metastatic involvement. 2. Scattered pulmonary parenchymal changes none of which are highly suspicious for metastatic disease. 3. Abnormal appearance of the thyroid gland with left lower lobe ill-defined mass spanning over 3 cm. Thyroid ultrasound can be performed for further delineation. 4. Cardiomegaly. Coronary artery calcification. 5.  Aortic Atherosclerosis (ICD10-I70.0). CT ABDOMEN PELVIS: 1. Numerous hepatic lesions suspicious for metastatic disease largest within the caudate lobe measuring up to 3.2 cm. 2. Gallbladder wall thickening. This may be related to increased right heart pressure or liver disease but could not exclude cholecystitis in the proper clinical setting. 3. Numerous splenic lesions which in the present clinical setting is suspicious for combination of metastatic disease and splenic cysts. 4. 3.2 cm left adrenal mass suspicious for metastatic disease. 5. Question sigmoid colon and possibly proximal descending colon mass. Rectosigmoid colon mass also not excluded. Correlation with colonoscopy results recommended. 6. Prominent number of colonic diverticula. Third spacing of fluid makes it difficult to evaluate for the possibility of diverticulitis. Radiopaque 1.5 cm structure within the right colon may be related to ingested foreign body. 7. Low-density fatty appearing structures in the external iliac region/pelvic sidewall bilaterally with adjacent low-density iliac lymph nodes and retroperitoneal lymph nodes raises possibility of low-density metastatic  adenopathy. Prominent size portacaval lymph node which short axis dimension of 1.3 cm. PET-CT could be obtained for further delineation if clinically desired. 8. Gas within the urinary bladder may be related to recent manipulation. Clinical correlation recommended. 9. Left adnexal 2.4 cm cyst. This can be assessed with pelvic sonogram. These results will be called to the ordering clinician or representative by the Radiologist Assistant, and communication documented in the PACS or zVision Dashboard. Electronically Signed   By: Genia Del M.D.   On: 11/01/2018 13:13   Ct Abdomen Pelvis W Contrast  Result Date: 11/01/2018 CLINICAL DATA:  72 year old female with colonic mass is seen on colonoscopy 1 week ago. Anemia in GI bleed for 6 months. No prior history of cancer. Initial encounter. EXAM: CT CHEST, ABDOMEN, AND PELVIS WITH CONTRAST  TECHNIQUE: Multidetector CT imaging of the chest, abdomen and pelvis was performed following the standard protocol during bolus administration of intravenous contrast. CONTRAST:  167mL OMNIPAQUE IOHEXOL 300 MG/ML  SOLN COMPARISON:  05/10/2018 chest x-ray.  No comparison CT. FINDINGS: CT CHEST FINDINGS Cardiovascular: Cardiomegaly. Prominent epicardial fat. Coronary artery calcification. Calcified aortic and mitral valve. Atherosclerotic changes aorta and great vessels. Ascending thoracic aorta measures up to 3.2 cm. No central pulmonary embolus noted. Mediastinum/Nodes: Abnormal appearance of the thyroid gland with left lower lobe ill-defined mass spanning over 3 cm. Right paratracheal adenopathy (immediately superior to the azygos vein) which short axis dimension of 1.2 cm. Aortic pulmonary window top-normal size lymph node. Lungs/Pleura: Small number of calcified granulomas. Scattered areas of scarring/subsegmental atelectasis. Scattered lungs cyst. No focal mass suspicious for pulmonary metastatic disease. Lung base atelectasis. Trachea and mainstem bronchi are patent.  Musculoskeletal: Degenerative changes thoracic spine. No osseous destructive lesion. CT ABDOMEN PELVIS FINDINGS Hepatobiliary: Numerous hepatic lesions suspicious for metastatic disease largest within the caudate lobe measuring up to 3.2 cm. Liver top-normal size. Gallbladder wall thickening. This may be related to increased right heart pressure or liver disease but could not exclude cholecystitis in the proper clinical setting. Slight haziness porta hepatis region. Pancreas: No worrisome pancreatic lesion. Spleen: Numerous splenic lesions which in the present clinical setting is suspicious for combination of metastatic disease and splenic cysts. Adrenals/Urinary Tract: No obstructing stone or hydronephrosis. Renal low-density structures too small to characterize possibly cyst. 3.2 cm left adrenal mass suspicious for metastatic disease. No right adrenal mass. Gas within the urinary bladder may be related to recent manipulation. No adjacent fistula identified. Stomach/Bowel: Question sigmoid colon and possibly proximal descending colon mass. Rectosigmoid colon mass also not excluded. Correlation with colonoscopy results recommended. Prominent number of colonic diverticula. Slight third spacing of fluid makes it difficult to evaluate for the possibility of diverticulitis. Radiopaque 1.5 cm structure within the right colon may be related to ingested foreign body. Under distended stomach without gross abnormality. Vascular/Lymphatic: Atherosclerotic changes aorta and aortic branch vessels without abdominal aortic aneurysm or large vessel occlusion. Low-density fatty appearing structures in the external iliac region/pelvic sidewall bilaterally with adjacent low-density iliac lymph nodes and retroperitoneal lymph nodes raises possibility of low-density metastatic adenopathy. Prominent size portacaval lymph node which short axis dimension of 1.3 cm. Reproductive: Left adnexal 2.4 cm cyst. Other: Third spacing of fluid. No  free air. Lax abdominal musculature without bowel containing hernia. Musculoskeletal: Degenerative changes lumbar spine most notable L4-5 and L5-S1 with 5 mm anterior slip L4 secondary to facet degenerative changes. Hip joint degenerative changes. No osseous destructive lesion. IMPRESSION: CT CHEST: 1. Right paratracheal adenopathy (immediately superior to the azygos vein) which short axis dimension of 1.2 cm. In the present clinical setting it is possible this is related to metastatic involvement. 2. Scattered pulmonary parenchymal changes none of which are highly suspicious for metastatic disease. 3. Abnormal appearance of the thyroid gland with left lower lobe ill-defined mass spanning over 3 cm. Thyroid ultrasound can be performed for further delineation. 4. Cardiomegaly. Coronary artery calcification. 5.  Aortic Atherosclerosis (ICD10-I70.0). CT ABDOMEN PELVIS: 1. Numerous hepatic lesions suspicious for metastatic disease largest within the caudate lobe measuring up to 3.2 cm. 2. Gallbladder wall thickening. This may be related to increased right heart pressure or liver disease but could not exclude cholecystitis in the proper clinical setting. 3. Numerous splenic lesions which in the present clinical setting is suspicious for combination of metastatic disease and splenic cysts. 4.  3.2 cm left adrenal mass suspicious for metastatic disease. 5. Question sigmoid colon and possibly proximal descending colon mass. Rectosigmoid colon mass also not excluded. Correlation with colonoscopy results recommended. 6. Prominent number of colonic diverticula. Third spacing of fluid makes it difficult to evaluate for the possibility of diverticulitis. Radiopaque 1.5 cm structure within the right colon may be related to ingested foreign body. 7. Low-density fatty appearing structures in the external iliac region/pelvic sidewall bilaterally with adjacent low-density iliac lymph nodes and retroperitoneal lymph nodes raises  possibility of low-density metastatic adenopathy. Prominent size portacaval lymph node which short axis dimension of 1.3 cm. PET-CT could be obtained for further delineation if clinically desired. 8. Gas within the urinary bladder may be related to recent manipulation. Clinical correlation recommended. 9. Left adnexal 2.4 cm cyst. This can be assessed with pelvic sonogram. These results will be called to the ordering clinician or representative by the Radiologist Assistant, and communication documented in the PACS or zVision Dashboard. Electronically Signed   By: Genia Del M.D.   On: 11/01/2018 13:13   Dg Chest Port 1 View  Result Date: 11/02/2018 CLINICAL DATA:  Colon cancer diagnosed 3 days ago by colonoscopy, drank contrast for a CT exam and then had a bloody bowel movement EXAM: PORTABLE CHEST 1 VIEW COMPARISON:  Portable exam 1153 hours compared to 05/10/2018 FINDINGS: Enlargement of cardiac silhouette. Atherosclerotic calcification aorta. Mediastinal contours and pulmonary vascularity normal. Lungs clear. No infiltrate, pleural effusion, or pneumothorax. Bones demineralized. IMPRESSION: Enlargement of cardiac silhouette. No acute abnormalities. Electronically Signed   By: Lavonia Dana M.D.   On: 11/02/2018 13:01   None  Pending Labs [employer] (From admission, onward)    Start     Ordered   Signed and Held  Comprehensive metabolic panel  Tomorrow morning,   R     Signed and Held   Signed and Held  CBC  Tomorrow morning,   R     Signed and Held          Vitals/Pain Today's Vitals   11/02/18 1210 11/02/18 1411 11/02/18 1435 11/02/18 1605  BP:  124/72 132/80 128/68  Pulse:   (!) 102 (!) 101  Resp:   18 16  Temp:  98.2 F (36.8 C) 98.3 F (36.8 C) 98.2 F (36.8 C)  TempSrc:   Oral Oral  SpO2:   93%   Weight: 77.1 kg     Height: 5\' 3"  (1.6 m)     PainSc:        Isolation Precautions No active isolations  Medications Medications  0.9 %  sodium chloride infusion (has  no administration in time range)  prothrombin complex conc human (KCENTRA) IVPB 3,813 Units (0 Units Intravenous Stopped 11/02/18 1352)    Mobility walks

## 2018-11-02 NOTE — ED Notes (Signed)
She is not positive about being admitted to hospital. She tells Dr. Jeanell Sparrow "I've been through this before and I've been fine". Dr. Jeanell Sparrow patiently explains that bleeding soon after having undergone a colonoscopy with polypectomy is actually dangerous. Pt. Agrees with plan to admit.

## 2018-11-02 NOTE — ED Notes (Signed)
She continues to feel well and tells me "I don't think I'll be staying". She is also aware that the gastroenterologist (Dr. Cristina Gong) is to see her soon. Her son remains at her bedside.

## 2018-11-03 DIAGNOSIS — D62 Acute posthemorrhagic anemia: Secondary | ICD-10-CM | POA: Diagnosis not present

## 2018-11-03 DIAGNOSIS — C189 Malignant neoplasm of colon, unspecified: Secondary | ICD-10-CM

## 2018-11-03 DIAGNOSIS — K922 Gastrointestinal hemorrhage, unspecified: Secondary | ICD-10-CM

## 2018-11-03 DIAGNOSIS — E119 Type 2 diabetes mellitus without complications: Secondary | ICD-10-CM | POA: Diagnosis not present

## 2018-11-03 DIAGNOSIS — Z794 Long term (current) use of insulin: Secondary | ICD-10-CM | POA: Diagnosis not present

## 2018-11-03 DIAGNOSIS — I10 Essential (primary) hypertension: Secondary | ICD-10-CM | POA: Diagnosis not present

## 2018-11-03 DIAGNOSIS — K921 Melena: Secondary | ICD-10-CM | POA: Diagnosis not present

## 2018-11-03 LAB — CBC
HCT: 26.2 % — ABNORMAL LOW (ref 36.0–46.0)
Hemoglobin: 7.7 g/dL — ABNORMAL LOW (ref 12.0–15.0)
MCH: 29.4 pg (ref 26.0–34.0)
MCHC: 29.4 g/dL — ABNORMAL LOW (ref 30.0–36.0)
MCV: 100 fL (ref 80.0–100.0)
Platelets: 197 10*3/uL (ref 150–400)
RBC: 2.62 MIL/uL — ABNORMAL LOW (ref 3.87–5.11)
RDW: 16.1 % — ABNORMAL HIGH (ref 11.5–15.5)
WBC: 6.1 10*3/uL (ref 4.0–10.5)
nRBC: 0 % (ref 0.0–0.2)

## 2018-11-03 LAB — COMPREHENSIVE METABOLIC PANEL
ALT: 11 U/L (ref 0–44)
AST: 8 U/L — ABNORMAL LOW (ref 15–41)
Albumin: 3 g/dL — ABNORMAL LOW (ref 3.5–5.0)
Alkaline Phosphatase: 33 U/L — ABNORMAL LOW (ref 38–126)
Anion gap: 9 (ref 5–15)
BUN: 28 mg/dL — AB (ref 8–23)
CO2: 26 mmol/L (ref 22–32)
Calcium: 8 mg/dL — ABNORMAL LOW (ref 8.9–10.3)
Chloride: 108 mmol/L (ref 98–111)
Creatinine, Ser: 0.84 mg/dL (ref 0.44–1.00)
GFR calc Af Amer: >60 mL/min (ref >60)
Glucose, Bld: 138 mg/dL — ABNORMAL HIGH (ref 70–99)
Potassium: 4 mmol/L (ref 3.5–5.1)
Sodium: 143 mmol/L (ref 135–145)
Total Bilirubin: 0.6 mg/dL (ref 0.3–1.2)
Total Protein: 4.8 g/dL — ABNORMAL LOW (ref 6.5–8.1)

## 2018-11-03 LAB — GLUCOSE, CAPILLARY
Glucose-Capillary: 123 mg/dL — ABNORMAL HIGH (ref 70–99)
Glucose-Capillary: 230 mg/dL — ABNORMAL HIGH (ref 70–99)

## 2018-11-03 LAB — TYPE AND SCREEN
ABO/RH(D): A POS
Antibody Screen: NEGATIVE
Unit division: 0

## 2018-11-03 LAB — BPAM RBC
Blood Product Expiration Date: 202001112359
ISSUE DATE / TIME: 201912211400
Unit Type and Rh: 6200

## 2018-11-03 LAB — PROTIME-INR
INR: 1.27
Prothrombin Time: 15.8 seconds — ABNORMAL HIGH (ref 11.4–15.2)

## 2018-11-03 MED ORDER — ATORVASTATIN CALCIUM 10 MG PO TABS: 10.0000 mg | ORAL_TABLET | Freq: Every day | ORAL | 0 refills | Status: DC

## 2018-11-03 NOTE — Discharge Summary (Signed)
Physician Discharge Summary  Cassandra Allen [facility] IZT:[id number] DOB: 02-05-1946 DOA: 11/02/2018  PCP: [name], MD  Admit date: 11/02/2018 Discharge date: 11/03/2018  Admitted From: Home  Disposition:  Home   Recommendations for Outpatient Follow-up:  1. Follow up with PCP in 1 weeks 2. Please obtain BMP/CBC in one week your next doctors visit.  3. Resume Eliquis in 2 days, 11/05/18 evening. If no further bleeding    Discharge Condition: Stable CODE STATUS: Full code Diet recommendation: Diabetic  Brief/Interim Summary: 72 year old female with history of metastatic colon cancer, diabetes mellitus type 2, atrial fibrillation on Eliquis, essential hypertension Came to the hospital with complaints of bright red blood per rectum. Underwent C scope with dr [name] 8 days prior to hospitalization requiring Polypectomy. Eliquis resumed 2 days post Procedure.  Upon admission her Hb was 7.0 for which she was transufsed 1 unit PRBC and responsed well. Following day felt better and wanted to go home.  Patient stable for discharge today once cleared by GI.   Discharge Diagnoses:  Active Problems:   GI bleed   Rectal bleed  Acute blood loss anemia secondary to post polypectomy -Status post 1 unit PRBC transfusion.  Hemoglobin responded well and increased to 7.7.  No further bleeding noted at this point.  Patient seen by gastroenterology and recommended holding Eliquis for 2 more days before restarting it if no further symptoms at home.  History of atrial fibrillation -CHADsVASC >4, continue home diltiazem, continue to hold Eliquis for 2 more days prior to restarting it.  Follow-up outpatient with cardiology  Essential hypertension -Resume home meds  Diabetes mellitus type 2 -Resume home insulin  Metastatic colon cancer -Plans for liver biopsy outpatient.  Plans to hold Eliquis prior to biopsy which can be arranged by her oncologist and IR  Hyperlipidemia -Continue  statin  Urinary retention -Continue oxybutynin  Depression -Continue Zoloft  Patient on SCDs while in the hospital Discharged today in stable condition.  Spoke with Dr. Cristina Gong from gastroenterology this morning.  Will see the patient prior to discharge    Discharge Instructions   Allergies as of 11/03/2018      Reactions   Metformin And Related Nausea And Vomiting   Prednisone Nausea And Vomiting      Medication List    STOP taking these medications   simvastatin 20 MG tablet Commonly known as:  ZOCOR     TAKE these medications   apixaban 5 MG Tabs tablet Commonly known as:  ELIQUIS TAKE 1 TABLET (5 MG TOTAL) BY MOUTH 2 (TWO) TIMES DAILY.   atorvastatin 10 MG tablet Commonly known as:  LIPITOR Take 1 tablet (10 mg total) by mouth daily at 6 PM.   beta carotene w/minerals tablet Take 1 tablet by mouth daily at 12 noon.   cholecalciferol 1000 units tablet Commonly known as:  VITAMIN D Take 1,000 Units by mouth at bedtime.   diltiazem 120 MG 24 hr capsule Commonly known as:  CARDIZEM CD Take 1 capsule (120 mg total) by mouth 2 (two) times daily.   diphenoxylate-atropine 2.5-0.025 MG tablet Commonly known as:  LOMOTIL Take 1 to 2 tablets 4 times daily as needed   ferrous sulfate 325 (65 FE) MG EC tablet Take 325 mg by mouth daily with breakfast.   HYDROcodone-acetaminophen 7.5-325 MG tablet Commonly known as:  NORCO Take 1 tablet by mouth 2 (two) times daily as needed (for pain.).   LEVEMIR FLEXTOUCH 100 UNIT/ML Pen Generic drug:  Insulin Detemir Inject 10 Units  into the skin daily.   liraglutide 18 MG/3ML Sopn Commonly known as:  VICTOZA Inject 1.8 mg into the skin every morning.   losartan-hydrochlorothiazide 100-12.5 MG tablet Commonly known as:  HYZAAR Take 1 tablet by mouth daily.   multivitamin with minerals Tabs tablet Take 2 tablets by mouth daily at 12 noon.   oxybutynin 3.9 MG/24HR Commonly known as:  OXYTROL Place 1 patch onto the  skin 2 (two) times a week.   oxybutynin 5 MG tablet Commonly known as:  DITROPAN Take 20 mg by mouth daily.   sertraline 100 MG tablet Commonly known as:  ZOLOFT Take 100 mg by mouth daily at 12 noon.   simethicone 80 MG chewable tablet Commonly known as:  MYLICON Chew 76-195 mg by mouth 2 (two) times daily as needed for flatulence.       Allergies  Allergen Reactions  . Metformin And Related Nausea And Vomiting  . Prednisone Nausea And Vomiting    You were cared for by a hospitalist during your hospital stay. If you have any questions about your discharge medications or the care you received while you were in the hospital after you are discharged, you can call the unit and asked to speak with the hospitalist on call if the hospitalist that took care of you is not available. Once you are discharged, your primary care physician will handle any further medical issues. Please note that no refills for any discharge medications will be authorized once you are discharged, as it is imperative that you return to your primary care physician (or establish a relationship with a primary care physician if you do not have one) for your aftercare needs so that they can reassess your need for medications and monitor your lab values.  Consultations: Gastroenterology   Procedures/Studies: Ct Chest W Contrast  Result Date: 11/01/2018 CLINICAL DATA:  72 year old female with colonic mass is seen on colonoscopy 1 week ago. Anemia in GI bleed for 6 months. No prior history of cancer. Initial encounter. EXAM: CT CHEST, ABDOMEN, AND PELVIS WITH CONTRAST TECHNIQUE: Multidetector CT imaging of the chest, abdomen and pelvis was performed following the standard protocol during bolus administration of intravenous contrast. CONTRAST:  151mL OMNIPAQUE IOHEXOL 300 MG/ML  SOLN COMPARISON:  05/10/2018 chest x-ray.  No comparison CT. FINDINGS: CT CHEST FINDINGS Cardiovascular: Cardiomegaly. Prominent epicardial fat.  Coronary artery calcification. Calcified aortic and mitral valve. Atherosclerotic changes aorta and great vessels. Ascending thoracic aorta measures up to 3.2 cm. No central pulmonary embolus noted. Mediastinum/Nodes: Abnormal appearance of the thyroid gland with left lower lobe ill-defined mass spanning over 3 cm. Right paratracheal adenopathy (immediately superior to the azygos vein) which short axis dimension of 1.2 cm. Aortic pulmonary window top-normal size lymph node. Lungs/Pleura: Small number of calcified granulomas. Scattered areas of scarring/subsegmental atelectasis. Scattered lungs cyst. No focal mass suspicious for pulmonary metastatic disease. Lung base atelectasis. Trachea and mainstem bronchi are patent. Musculoskeletal: Degenerative changes thoracic spine. No osseous destructive lesion. CT ABDOMEN PELVIS FINDINGS Hepatobiliary: Numerous hepatic lesions suspicious for metastatic disease largest within the caudate lobe measuring up to 3.2 cm. Liver top-normal size. Gallbladder wall thickening. This may be related to increased right heart pressure or liver disease but could not exclude cholecystitis in the proper clinical setting. Slight haziness porta hepatis region. Pancreas: No worrisome pancreatic lesion. Spleen: Numerous splenic lesions which in the present clinical setting is suspicious for combination of metastatic disease and splenic cysts. Adrenals/Urinary Tract: No obstructing stone or hydronephrosis. Renal low-density structures  too small to characterize possibly cyst. 3.2 cm left adrenal mass suspicious for metastatic disease. No right adrenal mass. Gas within the urinary bladder may be related to recent manipulation. No adjacent fistula identified. Stomach/Bowel: Question sigmoid colon and possibly proximal descending colon mass. Rectosigmoid colon mass also not excluded. Correlation with colonoscopy results recommended. Prominent number of colonic diverticula. Slight third spacing of  fluid makes it difficult to evaluate for the possibility of diverticulitis. Radiopaque 1.5 cm structure within the right colon may be related to ingested foreign body. Under distended stomach without gross abnormality. Vascular/Lymphatic: Atherosclerotic changes aorta and aortic branch vessels without abdominal aortic aneurysm or large vessel occlusion. Low-density fatty appearing structures in the external iliac region/pelvic sidewall bilaterally with adjacent low-density iliac lymph nodes and retroperitoneal lymph nodes raises possibility of low-density metastatic adenopathy. Prominent size portacaval lymph node which short axis dimension of 1.3 cm. Reproductive: Left adnexal 2.4 cm cyst. Other: Third spacing of fluid. No free air. Lax abdominal musculature without bowel containing hernia. Musculoskeletal: Degenerative changes lumbar spine most notable L4-5 and L5-S1 with 5 mm anterior slip L4 secondary to facet degenerative changes. Hip joint degenerative changes. No osseous destructive lesion. IMPRESSION: CT CHEST: 1. Right paratracheal adenopathy (immediately superior to the azygos vein) which short axis dimension of 1.2 cm. In the present clinical setting it is possible this is related to metastatic involvement. 2. Scattered pulmonary parenchymal changes none of which are highly suspicious for metastatic disease. 3. Abnormal appearance of the thyroid gland with left lower lobe ill-defined mass spanning over 3 cm. Thyroid ultrasound can be performed for further delineation. 4. Cardiomegaly. Coronary artery calcification. 5.  Aortic Atherosclerosis (ICD10-I70.0). CT ABDOMEN PELVIS: 1. Numerous hepatic lesions suspicious for metastatic disease largest within the caudate lobe measuring up to 3.2 cm. 2. Gallbladder wall thickening. This may be related to increased right heart pressure or liver disease but could not exclude cholecystitis in the proper clinical setting. 3. Numerous splenic lesions which in the  present clinical setting is suspicious for combination of metastatic disease and splenic cysts. 4. 3.2 cm left adrenal mass suspicious for metastatic disease. 5. Question sigmoid colon and possibly proximal descending colon mass. Rectosigmoid colon mass also not excluded. Correlation with colonoscopy results recommended. 6. Prominent number of colonic diverticula. Third spacing of fluid makes it difficult to evaluate for the possibility of diverticulitis. Radiopaque 1.5 cm structure within the right colon may be related to ingested foreign body. 7. Low-density fatty appearing structures in the external iliac region/pelvic sidewall bilaterally with adjacent low-density iliac lymph nodes and retroperitoneal lymph nodes raises possibility of low-density metastatic adenopathy. Prominent size portacaval lymph node which short axis dimension of 1.3 cm. PET-CT could be obtained for further delineation if clinically desired. 8. Gas within the urinary bladder may be related to recent manipulation. Clinical correlation recommended. 9. Left adnexal 2.4 cm cyst. This can be assessed with pelvic sonogram. These results will be called to the ordering clinician or representative by the Radiologist Assistant, and communication documented in the PACS or zVision Dashboard. Electronically Signed   By: Genia Del M.D.   On: 11/01/2018 13:13   Ct Abdomen Pelvis W Contrast  Result Date: 11/01/2018 CLINICAL DATA:  72 year old female with colonic mass is seen on colonoscopy 1 week ago. Anemia in GI bleed for 6 months. No prior history of cancer. Initial encounter. EXAM: CT CHEST, ABDOMEN, AND PELVIS WITH CONTRAST TECHNIQUE: Multidetector CT imaging of the chest, abdomen and pelvis was performed following the standard protocol during  bolus administration of intravenous contrast. CONTRAST:  115mL OMNIPAQUE IOHEXOL 300 MG/ML  SOLN COMPARISON:  05/10/2018 chest x-ray.  No comparison CT. FINDINGS: CT CHEST FINDINGS Cardiovascular:  Cardiomegaly. Prominent epicardial fat. Coronary artery calcification. Calcified aortic and mitral valve. Atherosclerotic changes aorta and great vessels. Ascending thoracic aorta measures up to 3.2 cm. No central pulmonary embolus noted. Mediastinum/Nodes: Abnormal appearance of the thyroid gland with left lower lobe ill-defined mass spanning over 3 cm. Right paratracheal adenopathy (immediately superior to the azygos vein) which short axis dimension of 1.2 cm. Aortic pulmonary window top-normal size lymph node. Lungs/Pleura: Small number of calcified granulomas. Scattered areas of scarring/subsegmental atelectasis. Scattered lungs cyst. No focal mass suspicious for pulmonary metastatic disease. Lung base atelectasis. Trachea and mainstem bronchi are patent. Musculoskeletal: Degenerative changes thoracic spine. No osseous destructive lesion. CT ABDOMEN PELVIS FINDINGS Hepatobiliary: Numerous hepatic lesions suspicious for metastatic disease largest within the caudate lobe measuring up to 3.2 cm. Liver top-normal size. Gallbladder wall thickening. This may be related to increased right heart pressure or liver disease but could not exclude cholecystitis in the proper clinical setting. Slight haziness porta hepatis region. Pancreas: No worrisome pancreatic lesion. Spleen: Numerous splenic lesions which in the present clinical setting is suspicious for combination of metastatic disease and splenic cysts. Adrenals/Urinary Tract: No obstructing stone or hydronephrosis. Renal low-density structures too small to characterize possibly cyst. 3.2 cm left adrenal mass suspicious for metastatic disease. No right adrenal mass. Gas within the urinary bladder may be related to recent manipulation. No adjacent fistula identified. Stomach/Bowel: Question sigmoid colon and possibly proximal descending colon mass. Rectosigmoid colon mass also not excluded. Correlation with colonoscopy results recommended. Prominent number of colonic  diverticula. Slight third spacing of fluid makes it difficult to evaluate for the possibility of diverticulitis. Radiopaque 1.5 cm structure within the right colon may be related to ingested foreign body. Under distended stomach without gross abnormality. Vascular/Lymphatic: Atherosclerotic changes aorta and aortic branch vessels without abdominal aortic aneurysm or large vessel occlusion. Low-density fatty appearing structures in the external iliac region/pelvic sidewall bilaterally with adjacent low-density iliac lymph nodes and retroperitoneal lymph nodes raises possibility of low-density metastatic adenopathy. Prominent size portacaval lymph node which short axis dimension of 1.3 cm. Reproductive: Left adnexal 2.4 cm cyst. Other: Third spacing of fluid. No free air. Lax abdominal musculature without bowel containing hernia. Musculoskeletal: Degenerative changes lumbar spine most notable L4-5 and L5-S1 with 5 mm anterior slip L4 secondary to facet degenerative changes. Hip joint degenerative changes. No osseous destructive lesion. IMPRESSION: CT CHEST: 1. Right paratracheal adenopathy (immediately superior to the azygos vein) which short axis dimension of 1.2 cm. In the present clinical setting it is possible this is related to metastatic involvement. 2. Scattered pulmonary parenchymal changes none of which are highly suspicious for metastatic disease. 3. Abnormal appearance of the thyroid gland with left lower lobe ill-defined mass spanning over 3 cm. Thyroid ultrasound can be performed for further delineation. 4. Cardiomegaly. Coronary artery calcification. 5.  Aortic Atherosclerosis (ICD10-I70.0). CT ABDOMEN PELVIS: 1. Numerous hepatic lesions suspicious for metastatic disease largest within the caudate lobe measuring up to 3.2 cm. 2. Gallbladder wall thickening. This may be related to increased right heart pressure or liver disease but could not exclude cholecystitis in the proper clinical setting. 3.  Numerous splenic lesions which in the present clinical setting is suspicious for combination of metastatic disease and splenic cysts. 4. 3.2 cm left adrenal mass suspicious for metastatic disease. 5. Question sigmoid colon and possibly proximal  descending colon mass. Rectosigmoid colon mass also not excluded. Correlation with colonoscopy results recommended. 6. Prominent number of colonic diverticula. Third spacing of fluid makes it difficult to evaluate for the possibility of diverticulitis. Radiopaque 1.5 cm structure within the right colon may be related to ingested foreign body. 7. Low-density fatty appearing structures in the external iliac region/pelvic sidewall bilaterally with adjacent low-density iliac lymph nodes and retroperitoneal lymph nodes raises possibility of low-density metastatic adenopathy. Prominent size portacaval lymph node which short axis dimension of 1.3 cm. PET-CT could be obtained for further delineation if clinically desired. 8. Gas within the urinary bladder may be related to recent manipulation. Clinical correlation recommended. 9. Left adnexal 2.4 cm cyst. This can be assessed with pelvic sonogram. These results will be called to the ordering clinician or representative by the Radiologist Assistant, and communication documented in the PACS or zVision Dashboard. Electronically Signed   By: Genia Del M.D.   On: 11/01/2018 13:13   Dg Chest Port 1 View  Result Date: 11/02/2018 CLINICAL DATA:  Colon cancer diagnosed 3 days ago by colonoscopy, drank contrast for a CT exam and then had a bloody bowel movement EXAM: PORTABLE CHEST 1 VIEW COMPARISON:  Portable exam 1153 hours compared to 05/10/2018 FINDINGS: Enlargement of cardiac silhouette. Atherosclerotic calcification aorta. Mediastinal contours and pulmonary vascularity normal. Lungs clear. No infiltrate, pleural effusion, or pneumothorax. Bones demineralized. IMPRESSION: Enlargement of cardiac silhouette. No acute abnormalities.  Electronically Signed   By: Lavonia Dana M.D.   On: 11/02/2018 13:01      Subjective: Feels better, no complaints.  No further bleeding overnight.  She wishes to go home  General = no fevers, chills, dizziness, malaise, fatigue HEENT/EYES = negative for pain, redness, loss of vision, double vision, blurred vision, loss of hearing, sore throat, hoarseness, dysphagia Cardiovascular= negative for chest pain, palpitation, murmurs, lower extremity swelling Respiratory/lungs= negative for shortness of breath, cough, hemoptysis, wheezing, mucus production Gastrointestinal= negative for nausea, vomiting,, abdominal pain, melena, hematemesis Genitourinary= negative for Dysuria, Hematuria, Change in Urinary Frequency MSK = Negative for arthralgia, myalgias, Back Pain, Joint swelling  Neurology= Negative for headache, seizures, numbness, tingling  Psychiatry= Negative for anxiety, depression, suicidal and homocidal ideation Allergy/Immunology= Medication/Food allergy as listed  Skin= Negative for Rash, lesions, ulcers, itching   Discharge Exam: Vitals:   11/02/18 2042 11/03/18 0548  BP: (!) 110/50 134/65  Pulse: 85 73  Resp: 17 17  Temp: 98.2 F (36.8 C) 98 F (36.7 C)  SpO2: 100% 100%   Vitals:   11/02/18 1703 11/02/18 1704 11/02/18 2042 11/03/18 0548  BP: 125/66 125/66 (!) 110/50 134/65  Pulse: 71 80 85 73  Resp: 14  17 17   Temp: 98.3 F (36.8 C) 98.8 F (37.1 C) 98.2 F (36.8 C) 98 F (36.7 C)  TempSrc: Oral Oral Oral Oral  SpO2: 100% 100% 100% 100%  Weight: 77.1 kg     Height: 5\' 3"  (1.6 m)       General: Pt is alert, awake, not in acute distress Cardiovascular: RRR, S1/S2 +, no rubs, no gallops Respiratory: CTA bilaterally, no wheezing, no rhonchi Abdominal: Soft, NT, ND, bowel sounds + Extremities: no edema, no cyanosis    The results of significant diagnostics from this hospitalization (including imaging, microbiology, ancillary and laboratory) are listed below for  reference.     Microbiology: No results found for this or any previous visit (from the past 240 hour(s)).   Labs: BNP (last 3 results) No results for input(s): BNP  in the last 8760 hours. Basic Metabolic Panel: Recent Labs  Lab 10/30/18 1630 11/02/18 1206 11/03/18 0541  NA 143 142 143  K 4.2 3.9 4.0  CL 109 110 108  CO2 25 23 26   GLUCOSE 209* 118* 138*  BUN 20 34* 28*  CREATININE 1.07* 1.08* 0.84  CALCIUM 8.5* 8.0* 8.0*   Liver Function Tests: Recent Labs  Lab 10/30/18 1630 11/02/18 1206 11/03/18 0541  AST 7* 11* 8*  ALT 9 12 11   ALKPHOS 51 34* 33*  BILITOT 0.3 0.7 0.6  PROT 5.6* 4.9* 4.8*  ALBUMIN 3.2* 3.1* 3.0*   No results for input(s): LIPASE, AMYLASE in the last 168 hours. No results for input(s): AMMONIA in the last 168 hours. CBC: Recent Labs  Lab 10/30/18 1630 11/02/18 1206 11/03/18 0541  WBC 8.2 8.3 6.1  NEUTROABS 7.6  --   --   HGB 8.7* 7.0* 7.7*  HCT 29.7* 25.9* 26.2*  MCV 101.4* 107.9* 100.0  PLT 210 209 197   Cardiac Enzymes: No results for input(s): CKTOTAL, CKMB, CKMBINDEX, TROPONINI in the last 168 hours. BNP: Invalid input(s): POCBNP CBG: Recent Labs  Lab 11/02/18 1709 11/02/18 2044 11/03/18 0803  GLUCAP 109* 236* 123*   D-Dimer No results for input(s): DDIMER in the last 72 hours. Hgb A1c No results for input(s): HGBA1C in the last 72 hours. Lipid Profile No results for input(s): CHOL, HDL, LDLCALC, TRIG, CHOLHDL, LDLDIRECT in the last 72 hours. Thyroid function studies No results for input(s): TSH, T4TOTAL, T3FREE, THYROIDAB in the last 72 hours.  Invalid input(s): FREET3 Anemia work up No results for input(s): VITAMINB12, FOLATE, FERRITIN, TIBC, IRON, RETICCTPCT in the last 72 hours. Urinalysis    Component Value Date/Time   COLORURINE YELLOW 03/25/2015 2317   APPEARANCEUR CLOUDY (A) 03/25/2015 2317   LABSPEC 1.014 03/25/2015 2317   PHURINE 5.0 03/25/2015 2317   GLUCOSEU NEGATIVE 03/25/2015 2317   HGBUR NEGATIVE  03/25/2015 2317   BILIRUBINUR NEGATIVE 03/25/2015 2317   KETONESUR NEGATIVE 03/25/2015 2317   PROTEINUR NEGATIVE 03/25/2015 2317   UROBILINOGEN 0.2 03/25/2015 2317   NITRITE POSITIVE (A) 03/25/2015 2317   LEUKOCYTESUR MODERATE (A) 03/25/2015 2317   Sepsis Labs Invalid input(s): PROCALCITONIN,  WBC,  LACTICIDVEN Microbiology No results found for this or any previous visit (from the past 240 hour(s)).   Time coordinating discharge:  I have spent 35 minutes face to face with the patient and on the ward discussing the patients care, assessment, plan and disposition with other care givers. >50% of the time was devoted counseling the patient about the risks and benefits of treatment/Discharge disposition and coordinating care.   SIGNED:   Damita Lack, MD  [facility] 11/03/2018, 10:22 AM Pager   If 7PM-7AM, please contact night-coverage [email or web address] Password [email or web address]

## 2018-11-03 NOTE — Progress Notes (Signed)
Case discussed with Dr. Reesa Chew.  The patient did have a bowel movement today that was a mixture of old blood and stool, apparently no fresh blood.  Her hemoglobin came up appropriately following transfusion, to 7.7.    She is tolerating solid food, feels well and ready to go home, and I agree with plans for discharge.  I have asked the patient to follow-up by telephone with Dr. [name] tomorrow, to report on her progress, and to determine whether her previously scheduled office visit for 5 days from now is still necessary.  I discussed with the patient the considerations involved regarding when to resume her anticoagulation with Eliquis.  The sooner we start it, the less the chance of thromboembolic complications such as stroke, although thankfully that risk is very low in the short-term.  Conversely, the sooner we restart anticoagulation, the greater the risk of recurrent bleeding from the lower GI tract.  Overall, I think the most likely reason for this patient's GI bleed was a post polypectomy hemorrhage, although as mentioned in my note yesterday, there is some uncertainty about that, given the particular circumstances of the way the bleeding presented.  In any event, after some discussion, Dr. Reesa Chew and I thought that a happy medium would be to restart the Eliquis on Tuesday, 2 days from now.  That should give roughly 4 or 5 days of additional time for reepithelialization of any of the patient's polypectomy eschar sites from the time she initially bled, and by then, she should be pretty much "out of the woods" with respect to risk of further bleeding from those sites.  We could wait an additional several days to reduce that small risk even further, but doing so would be at a slightly increased risk of stroke.  The patient seems very agreeable with this course of action.  Cleotis Nipper, M.D. Pager [phone] If no answer or after 5 PM call [phone]

## 2018-11-04 ENCOUNTER — Telehealth: Payer: Self-pay

## 2018-11-04 ENCOUNTER — Other Ambulatory Visit: Payer: Self-pay

## 2018-11-04 ENCOUNTER — Telehealth: Payer: Self-pay | Admitting: Hematology

## 2018-11-04 ENCOUNTER — Encounter: Payer: Self-pay | Admitting: Hematology

## 2018-11-04 NOTE — Telephone Encounter (Signed)
R/s appt per 12/27 to 12/28 patient is aware of appt date and time .

## 2018-11-04 NOTE — Telephone Encounter (Signed)
Patient calls following up on when her liver biopsy is.  Also needs a note from Dr. Burr Medico to [facility] extending her leave of absence.  Has an infusion scheduled on 1/3 but has an appointment with Dr. Marcello Moores.  She wants to know if she should keep her appointment with Dr. Marcello Moores.

## 2018-11-04 NOTE — Progress Notes (Signed)
FMLA completed and successfully faxed to [employer] at [phone]. Copy given to patient in office.

## 2018-11-04 NOTE — Telephone Encounter (Signed)
Called patient back and answered her questions.

## 2018-11-07 ENCOUNTER — Other Ambulatory Visit: Payer: Self-pay | Admitting: Radiology

## 2018-11-07 ENCOUNTER — Other Ambulatory Visit: Payer: Self-pay | Admitting: Hematology

## 2018-11-08 ENCOUNTER — Other Ambulatory Visit: Payer: Self-pay

## 2018-11-08 ENCOUNTER — Inpatient Hospital Stay: Payer: Medicare HMO

## 2018-11-08 ENCOUNTER — Ambulatory Visit: Payer: Self-pay | Admitting: Hematology

## 2018-11-08 ENCOUNTER — Ambulatory Visit: Payer: Self-pay

## 2018-11-08 DIAGNOSIS — Z794 Long term (current) use of insulin: Secondary | ICD-10-CM | POA: Diagnosis not present

## 2018-11-08 DIAGNOSIS — D638 Anemia in other chronic diseases classified elsewhere: Secondary | ICD-10-CM | POA: Diagnosis not present

## 2018-11-08 DIAGNOSIS — Z79899 Other long term (current) drug therapy: Secondary | ICD-10-CM | POA: Diagnosis not present

## 2018-11-08 DIAGNOSIS — E119 Type 2 diabetes mellitus without complications: Secondary | ICD-10-CM | POA: Diagnosis not present

## 2018-11-08 DIAGNOSIS — C186 Malignant neoplasm of descending colon: Secondary | ICD-10-CM | POA: Diagnosis not present

## 2018-11-08 DIAGNOSIS — E669 Obesity, unspecified: Secondary | ICD-10-CM | POA: Diagnosis not present

## 2018-11-08 DIAGNOSIS — Z7901 Long term (current) use of anticoagulants: Secondary | ICD-10-CM | POA: Diagnosis not present

## 2018-11-08 DIAGNOSIS — C183 Malignant neoplasm of hepatic flexure: Secondary | ICD-10-CM | POA: Diagnosis not present

## 2018-11-08 DIAGNOSIS — I10 Essential (primary) hypertension: Secondary | ICD-10-CM | POA: Diagnosis not present

## 2018-11-08 DIAGNOSIS — D500 Iron deficiency anemia secondary to blood loss (chronic): Secondary | ICD-10-CM | POA: Diagnosis not present

## 2018-11-08 DIAGNOSIS — D509 Iron deficiency anemia, unspecified: Secondary | ICD-10-CM

## 2018-11-08 LAB — CBC WITH DIFFERENTIAL (CANCER CENTER ONLY)
Abs Immature Granulocytes: 0.03 10*3/uL (ref 0.00–0.07)
Basophils Absolute: 0 10*3/uL (ref 0.0–0.1)
Basophils Relative: 1 %
Eosinophils Absolute: 0.1 10*3/uL (ref 0.0–0.5)
Eosinophils Relative: 2 %
HCT: 30.1 % — ABNORMAL LOW (ref 36.0–46.0)
Hemoglobin: 8.7 g/dL — ABNORMAL LOW (ref 12.0–15.0)
Immature Granulocytes: 1 %
Lymphocytes Relative: 10 %
Lymphs Abs: 0.7 10*3/uL (ref 0.7–4.0)
MCH: 29.8 pg (ref 26.0–34.0)
MCHC: 28.9 g/dL — ABNORMAL LOW (ref 30.0–36.0)
MCV: 103.1 fL — ABNORMAL HIGH (ref 80.0–100.0)
MONOS PCT: 10 %
Monocytes Absolute: 0.7 10*3/uL (ref 0.1–1.0)
Neutro Abs: 5.1 10*3/uL (ref 1.7–7.7)
Neutrophils Relative %: 76 %
Platelet Count: 219 10*3/uL (ref 150–400)
RBC: 2.92 MIL/uL — ABNORMAL LOW (ref 3.87–5.11)
RDW: 14.8 % (ref 11.5–15.5)
WBC Count: 6.6 10*3/uL (ref 4.0–10.5)
nRBC: 0 % (ref 0.0–0.2)

## 2018-11-08 LAB — CMP (CANCER CENTER ONLY)
ALBUMIN: 3.3 g/dL — AB (ref 3.5–5.0)
ALT: 10 U/L (ref 0–44)
AST: 9 U/L — ABNORMAL LOW (ref 15–41)
Alkaline Phosphatase: 43 U/L (ref 38–126)
Anion gap: 6 (ref 5–15)
BUN: 20 mg/dL (ref 8–23)
CHLORIDE: 108 mmol/L (ref 98–111)
CO2: 30 mmol/L (ref 22–32)
Calcium: 8.9 mg/dL (ref 8.9–10.3)
Creatinine: 0.91 mg/dL (ref 0.44–1.00)
GFR, Est AFR Am: >60 mL/min (ref >60)
GFR, Estimated: >60 mL/min (ref >60)
GLUCOSE: 97 mg/dL (ref 70–99)
Potassium: 4.7 mmol/L (ref 3.5–5.1)
SODIUM: 144 mmol/L (ref 135–145)
Total Bilirubin: 0.4 mg/dL (ref 0.3–1.2)
Total Protein: 5.5 g/dL — ABNORMAL LOW (ref 6.5–8.1)

## 2018-11-08 LAB — IRON AND TIBC
Iron: 43 ug/dL (ref 41–142)
Saturation Ratios: 16 % — ABNORMAL LOW (ref 21–57)
TIBC: 266 ug/dL (ref 236–444)
UIBC: 224 ug/dL (ref 120–384)

## 2018-11-08 LAB — FERRITIN: FERRITIN: 134 ng/mL (ref 11–307)

## 2018-11-09 ENCOUNTER — Inpatient Hospital Stay: Payer: Medicare HMO

## 2018-11-09 VITALS — BP 125/75 | HR 78 | Temp 98.9°F | Resp 16

## 2018-11-09 DIAGNOSIS — E119 Type 2 diabetes mellitus without complications: Secondary | ICD-10-CM | POA: Diagnosis not present

## 2018-11-09 DIAGNOSIS — C183 Malignant neoplasm of hepatic flexure: Secondary | ICD-10-CM | POA: Diagnosis not present

## 2018-11-09 DIAGNOSIS — D509 Iron deficiency anemia, unspecified: Secondary | ICD-10-CM

## 2018-11-09 DIAGNOSIS — D500 Iron deficiency anemia secondary to blood loss (chronic): Secondary | ICD-10-CM | POA: Diagnosis not present

## 2018-11-09 DIAGNOSIS — Z79899 Other long term (current) drug therapy: Secondary | ICD-10-CM | POA: Diagnosis not present

## 2018-11-09 DIAGNOSIS — E669 Obesity, unspecified: Secondary | ICD-10-CM | POA: Diagnosis not present

## 2018-11-09 DIAGNOSIS — I10 Essential (primary) hypertension: Secondary | ICD-10-CM | POA: Diagnosis not present

## 2018-11-09 DIAGNOSIS — D638 Anemia in other chronic diseases classified elsewhere: Secondary | ICD-10-CM | POA: Diagnosis not present

## 2018-11-09 DIAGNOSIS — Z7901 Long term (current) use of anticoagulants: Secondary | ICD-10-CM | POA: Diagnosis not present

## 2018-11-09 DIAGNOSIS — Z794 Long term (current) use of insulin: Secondary | ICD-10-CM | POA: Diagnosis not present

## 2018-11-09 MED ORDER — FAMOTIDINE IN NACL 20-0.9 MG/50ML-% IV SOLN
20.0000 mg | Freq: Once | INTRAVENOUS | Status: AC
  Administered 2018-11-09: 20 mg via INTRAVENOUS

## 2018-11-09 MED ORDER — SODIUM CHLORIDE 0.9 % IV SOLN
200.0000 mg | Freq: Once | INTRAVENOUS | Status: AC
  Administered 2018-11-09: 200 mg via INTRAVENOUS
  Filled 2018-11-09: qty 10

## 2018-11-09 MED ORDER — ACETAMINOPHEN 325 MG PO TABS
650.0000 mg | ORAL_TABLET | Freq: Once | ORAL | Status: AC
  Administered 2018-11-09: 650 mg via ORAL

## 2018-11-09 MED ORDER — DIPHENHYDRAMINE HCL 25 MG PO CAPS
50.0000 mg | ORAL_CAPSULE | Freq: Once | ORAL | Status: AC
  Administered 2018-11-09: 50 mg via ORAL

## 2018-11-09 MED ORDER — METHYLPREDNISOLONE SODIUM SUCC 125 MG IJ SOLR
125.0000 mg | Freq: Once | INTRAMUSCULAR | Status: AC
Start: 2018-11-09 — End: 2018-11-09
  Administered 2018-11-09: 125 mg via INTRAVENOUS

## 2018-11-09 MED ORDER — SODIUM CHLORIDE 0.9 % IV SOLN: 200.0000 mg | Freq: Once | INTRAVENOUS | Status: DC

## 2018-11-09 MED ORDER — DIPHENHYDRAMINE HCL 25 MG PO CAPS
ORAL_CAPSULE | ORAL | Status: AC
  Filled 2018-11-09: qty 2

## 2018-11-09 MED ORDER — ACETAMINOPHEN 325 MG PO TABS
ORAL_TABLET | ORAL | Status: AC
  Filled 2018-11-09: qty 2

## 2018-11-09 MED ORDER — METHYLPREDNISOLONE SODIUM SUCC 125 MG IJ SOLR
INTRAMUSCULAR | Status: AC
  Filled 2018-11-09: qty 2

## 2018-11-09 MED ORDER — FAMOTIDINE IN NACL 20-0.9 MG/50ML-% IV SOLN
INTRAVENOUS | Status: AC
  Filled 2018-11-09: qty 50

## 2018-11-09 NOTE — Patient Instructions (Signed)
Iron Sucrose injection What is this medicine? IRON SUCROSE (AHY ern SOO krohs) is an iron complex. Iron is used to make healthy red blood cells, which carry oxygen and nutrients throughout the body. This medicine is used to treat iron deficiency anemia in people with chronic kidney disease. This medicine may be used for other purposes; ask your health care provider or pharmacist if you have questions. COMMON BRAND NAME(S): Venofer What should I tell my health care provider before I take this medicine? They need to know if you have any of these conditions: -anemia not caused by low iron levels -heart disease -high levels of iron in the blood -kidney disease -liver disease -an unusual or allergic reaction to iron, other medicines, foods, dyes, or preservatives -pregnant or trying to get pregnant -breast-feeding How should I use this medicine? This medicine is for infusion into a vein. It is given by a health care professional in a hospital or clinic setting. Talk to your pediatrician regarding the use of this medicine in children. While this drug may be prescribed for children as young as 2 years for selected conditions, precautions do apply. Overdosage: If you think you have taken too much of this medicine contact a poison control center or emergency room at once. NOTE: This medicine is only for you. Do not share this medicine with others. What if I miss a dose? It is important not to miss your dose. Call your doctor or health care professional if you are unable to keep an appointment. What may interact with this medicine? Do not take this medicine with any of the following medications: -deferoxamine -dimercaprol -other iron products This medicine may also interact with the following medications: -chloramphenicol -deferasirox This list may not describe all possible interactions. Give your health care provider a list of all the medicines, herbs, non-prescription drugs, or dietary  supplements you use. Also tell them if you smoke, drink alcohol, or use illegal drugs. Some items may interact with your medicine. What should I watch for while using this medicine? Visit your doctor or healthcare professional regularly. Tell your doctor or healthcare professional if your symptoms do not start to get better or if they get worse. You may need blood work done while you are taking this medicine. You may need to follow a special diet. Talk to your doctor. Foods that contain iron include: whole grains/cereals, dried fruits, beans, or peas, leafy green vegetables, and organ meats (liver, kidney). What side effects may I notice from receiving this medicine? Side effects that you should report to your doctor or health care professional as soon as possible: -allergic reactions like skin rash, itching or hives, swelling of the face, lips, or tongue -breathing problems -changes in blood pressure -cough -fast, irregular heartbeat -feeling faint or lightheaded, falls -fever or chills -flushing, sweating, or hot feelings -joint or muscle aches/pains -seizures -swelling of the ankles or feet -unusually weak or tired Side effects that usually do not require medical attention (report to your doctor or health care professional if they continue or are bothersome): -diarrhea -feeling achy -headache -irritation at site where injected -nausea, vomiting -stomach upset -tiredness This list may not describe all possible side effects. Call your doctor for medical advice about side effects. You may report side effects to FDA at 1-800-FDA-1088. Where should I keep my medicine? This drug is given in a hospital or clinic and will not be stored at home. NOTE: This sheet is a summary. It may not cover all possible information. If   you have questions about this medicine, talk to your doctor, pharmacist, or health care provider.  2019 Elsevier/Gold Standard (2011-08-10 17:14:35)  

## 2018-11-11 ENCOUNTER — Ambulatory Visit (HOSPITAL_COMMUNITY)
Admission: RE | Admit: 2018-11-11 | Discharge: 2018-11-11 | Disposition: A | Discharge status: Home / Self Care | Payer: Medicare HMO | Source: Ambulatory Visit | Attending: Hematology | Admitting: Hematology

## 2018-11-11 ENCOUNTER — Encounter (HOSPITAL_COMMUNITY): Payer: Self-pay

## 2018-11-11 DIAGNOSIS — I10 Essential (primary) hypertension: Secondary | ICD-10-CM | POA: Diagnosis not present

## 2018-11-11 DIAGNOSIS — E119 Type 2 diabetes mellitus without complications: Secondary | ICD-10-CM | POA: Insufficient documentation

## 2018-11-11 DIAGNOSIS — D509 Iron deficiency anemia, unspecified: Secondary | ICD-10-CM | POA: Diagnosis not present

## 2018-11-11 DIAGNOSIS — C186 Malignant neoplasm of descending colon: Secondary | ICD-10-CM

## 2018-11-11 LAB — PROTIME-INR
INR: 1.28
Prothrombin Time: 15.8 seconds — ABNORMAL HIGH (ref 11.4–15.2)

## 2018-11-11 LAB — COMPREHENSIVE METABOLIC PANEL
ALT: 13 U/L (ref 0–44)
AST: 28 U/L (ref 15–41)
Albumin: 4.2 g/dL (ref 3.5–5.0)
Alkaline Phosphatase: 42 U/L (ref 38–126)
Anion gap: 10 (ref 5–15)
BUN: 30 mg/dL — ABNORMAL HIGH (ref 8–23)
CO2: 26 mmol/L (ref 22–32)
Calcium: 8.6 mg/dL — ABNORMAL LOW (ref 8.9–10.3)
Chloride: 106 mmol/L (ref 98–111)
Creatinine, Ser: 0.93 mg/dL (ref 0.44–1.00)
GFR calc Af Amer: >60 mL/min (ref >60)
Glucose, Bld: 127 mg/dL — ABNORMAL HIGH (ref 70–99)
Potassium: 4.6 mmol/L (ref 3.5–5.1)
Sodium: 142 mmol/L (ref 135–145)
Total Bilirubin: 1.3 mg/dL — ABNORMAL HIGH (ref 0.3–1.2)
Total Protein: 6.8 g/dL (ref 6.5–8.1)

## 2018-11-11 LAB — GLUCOSE, CAPILLARY: Glucose-Capillary: 120 mg/dL — ABNORMAL HIGH (ref 70–99)

## 2018-11-11 LAB — CBC WITH DIFFERENTIAL/PLATELET
Abs Immature Granulocytes: 0.09 10*3/uL — ABNORMAL HIGH (ref 0.00–0.07)
Basophils Absolute: 0.1 10*3/uL (ref 0.0–0.1)
Basophils Relative: 0 %
Eosinophils Absolute: 0.1 10*3/uL (ref 0.0–0.5)
Eosinophils Relative: 1 %
HCT: 34.1 % — ABNORMAL LOW (ref 36.0–46.0)
Hemoglobin: 9.7 g/dL — ABNORMAL LOW (ref 12.0–15.0)
Immature Granulocytes: 1 %
Lymphocytes Relative: 7 %
Lymphs Abs: 0.9 10*3/uL (ref 0.7–4.0)
MCH: 29.7 pg (ref 26.0–34.0)
MCHC: 28.4 g/dL — ABNORMAL LOW (ref 30.0–36.0)
MCV: 104.3 fL — ABNORMAL HIGH (ref 80.0–100.0)
MONO ABS: 1.2 10*3/uL — AB (ref 0.1–1.0)
MONOS PCT: 9 %
Neutro Abs: 10.8 10*3/uL — ABNORMAL HIGH (ref 1.7–7.7)
Neutrophils Relative %: 82 %
Platelets: 262 10*3/uL (ref 150–400)
RBC: 3.27 MIL/uL — ABNORMAL LOW (ref 3.87–5.11)
RDW: 15.2 % (ref 11.5–15.5)
WBC: 13.1 10*3/uL — ABNORMAL HIGH (ref 4.0–10.5)
nRBC: 0 % (ref 0.0–0.2)

## 2018-11-11 MED ORDER — SODIUM CHLORIDE 0.9 % IV SOLN: INTRAVENOUS | Status: DC

## 2018-11-11 NOTE — Discharge Instructions (Signed)
Moderate Conscious Sedation, Adult, Care After  These instructions provide you with information about caring for yourself after your procedure. Your health care provider may also give you more specific instructions. Your treatment has been planned according to current medical practices, but problems sometimes occur. Call your health care provider if you have any problems or questions after your procedure.  What can I expect after the procedure?  After your procedure, it is common:  · To feel sleepy for several hours.  · To feel clumsy and have poor balance for several hours.  · To have poor judgment for several hours.  · To vomit if you eat too soon.  Follow these instructions at home:  For at least 24 hours after the procedure:    · Do not:  ? Participate in activities where you could fall or become injured.  ? Drive.  ? Use heavy machinery.  ? Drink alcohol.  ? Take sleeping pills or medicines that cause drowsiness.  ? Make important decisions or sign legal documents.  ? Take care of children on your own.  · Rest.  Eating and drinking  · Follow the diet recommended by your health care provider.  · If you vomit:  ? Drink water, juice, or soup when you can drink without vomiting.  ? Make sure you have little or no nausea before eating solid foods.  General instructions  · Have a responsible adult stay with you until you are awake and alert.  · Take over-the-counter and prescription medicines only as told by your health care provider.  · If you smoke, do not smoke without supervision.  · Keep all follow-up visits as told by your health care provider. This is important.  Contact a health care provider if:  · You keep feeling nauseous or you keep vomiting.  · You feel light-headed.  · You develop a rash.  · You have a fever.  Get help right away if:  · You have trouble breathing.  This information is not intended to replace advice given to you by your health care provider. Make sure you discuss any questions you have  with your health care provider.  Document Released: 08/20/2013 Document Revised: 04/03/2016 Document Reviewed: 02/19/2016  Elsevier Interactive Patient Education © 2019 [employer]  Liver Biopsy, Care After  These instructions give you information on caring for yourself after your procedure. Your doctor may also give you more specific instructions. Call your doctor if you have any problems or questions after your procedure.  What can I expect after the procedure?  After the procedure, it is common to have:  · Pain and soreness where the biopsy was done.  · Bruising around the area where the biopsy was done.  · Sleepiness and be tired for a few days.  Follow these instructions at home:  Medicines  · Take over-the-counter and prescription medicines only as told by your doctor.  · If you were prescribed an antibiotic medicine, take it as told by your doctor. Do not stop taking the antibiotic even if you start to feel better.  · Do not take medicines such as aspirin and ibuprofen. These medicines can thin your blood. Do not take these medicines unless your doctor tells you to take them.  · If you are taking prescription pain medicine, take actions to prevent or treat constipation. Your doctor may recommend that you:  ? Drink enough fluid to keep your pee (urine) clear or pale yellow.  ? Take over-the-counter or   prescription medicines.  ? Eat foods that are high in fiber, such as fresh fruits and vegetables, whole grains, and beans.  ? Limit foods that are high in fat and processed sugars, such as fried and sweet foods.  Caring for your cut  · Follow instructions from your doctor about how to take care of your cuts from surgery (incisions). Make sure you:  ? Wash your hands with soap and water before you change your bandage (dressing). If you cannot use soap and water, use hand sanitizer.  ? Change your bandage as told by your doctor.  ? Leave stitches (sutures), skin glue, or skin tape (adhesive) strips in place. They  may need to stay in place for 2 weeks or longer. If tape strips get loose and curl up, you may trim the loose edges. Do not remove tape strips completely unless your doctor says it is okay.  · Check your cuts every day for signs of infection. Check for:  ? Redness, swelling, or more pain.  ? Fluid or blood.  ? Pus or a bad smell.  ? Warmth.  · Do not take baths, swim, or use a hot tub until your doctor says it is okay to do so.  Activity    · Rest at home for 1-2 days or as told by your doctor.  ? Avoid sitting for a long time without moving. Get up to take short walks every 1-2 hours.  · Return to your normal activities as told by your doctor. Ask what activities are safe for you.  · Do not do these things in the first 24 hours:  ? Drive.  ? Use machinery.  ? Take a bath or shower.  · Do not lift more than 10 pounds (4.5 kg) or play contact sports for the first 2 weeks.  General instructions    · Do not drink alcohol in the first week after the procedure.  · Have someone stay with you for at least 24 hours after the procedure.  · Get your test results. Ask your doctor or the department that is doing the test:  ? When will my results be ready?  ? How will I get my results?  ? What are my treatment options?  ? What other tests do I need?  ? What are my next steps?  · Keep all follow-up visits as told by your doctor. This is important.  Contact a doctor if:  · A cut bleeds and leaves more than just a small spot of blood.  · A cut is red, puffs up (swells), or hurts more than before.  · Fluid or something else comes from a cut.  · A cut smells bad.  · You have a fever or chills.  Get help right away if:  · You have swelling, bloating, or pain in your belly (abdomen).  · You get dizzy or faint.  · You have a rash.  · You feel sick to your stomach (nauseous) or throw up (vomit).  · You have trouble breathing, feel short of breath, or feel faint.  · Your chest hurts.  · You have problems talking or seeing.  · You have  trouble with your balance or moving your arms or legs.  Summary  · After the procedure, it is common to have pain, soreness, bruising, and tiredness.  · Your doctor will tell you how to take care of yourself at home. Change your bandage, take your medicines, and limit your activities   as told by your doctor.  · Call your doctor if you have symptoms of infection. Get help right away if your belly swells, your cut bleeds a lot, or you have trouble talking or breathing.  This information is not intended to replace advice given to you by your health care provider. Make sure you discuss any questions you have with your health care provider.  Document Released: 08/08/2008 Document Revised: 11/09/2017 Document Reviewed: 11/09/2017  Elsevier Interactive Patient Education © 2019 [employer] Inc.

## 2018-11-11 NOTE — Progress Notes (Signed)
Patient ID: Cassandra Allen, female   DOB: 04-10-1946, 72 y.o.   MRN: [id number] Patient presented to radiology department today for image guided liver lesion biopsy, however arrangements have not been made for appropriate transport and care at home for patient post procedure, therefore will reschedule case for 11/20/2018.  Patient given preprocedure instructions.

## 2018-11-15 ENCOUNTER — Ambulatory Visit: Payer: Self-pay

## 2018-11-15 ENCOUNTER — Other Ambulatory Visit (HOSPITAL_COMMUNITY): Payer: Self-pay | Admitting: *Deleted

## 2018-11-15 DIAGNOSIS — C186 Malignant neoplasm of descending colon: Secondary | ICD-10-CM | POA: Diagnosis not present

## 2018-11-15 MED ORDER — DILTIAZEM HCL ER COATED BEADS 120 MG PO CP24: 120.0000 mg | ORAL_CAPSULE | Freq: Two times a day (BID) | ORAL | 3 refills | Status: DC

## 2018-11-16 ENCOUNTER — Inpatient Hospital Stay: Payer: Medicare HMO | Attending: Hematology and Oncology

## 2018-11-16 ENCOUNTER — Ambulatory Visit: Payer: Self-pay

## 2018-11-16 VITALS — BP 120/53 | HR 79 | Temp 98.9°F | Resp 18

## 2018-11-16 DIAGNOSIS — E669 Obesity, unspecified: Secondary | ICD-10-CM | POA: Insufficient documentation

## 2018-11-16 DIAGNOSIS — Z794 Long term (current) use of insulin: Secondary | ICD-10-CM | POA: Diagnosis not present

## 2018-11-16 DIAGNOSIS — D500 Iron deficiency anemia secondary to blood loss (chronic): Secondary | ICD-10-CM | POA: Diagnosis not present

## 2018-11-16 DIAGNOSIS — C787 Secondary malignant neoplasm of liver and intrahepatic bile duct: Secondary | ICD-10-CM | POA: Insufficient documentation

## 2018-11-16 DIAGNOSIS — E119 Type 2 diabetes mellitus without complications: Secondary | ICD-10-CM | POA: Insufficient documentation

## 2018-11-16 DIAGNOSIS — D509 Iron deficiency anemia, unspecified: Secondary | ICD-10-CM

## 2018-11-16 DIAGNOSIS — R630 Anorexia: Secondary | ICD-10-CM | POA: Diagnosis not present

## 2018-11-16 DIAGNOSIS — Z79899 Other long term (current) drug therapy: Secondary | ICD-10-CM | POA: Diagnosis not present

## 2018-11-16 DIAGNOSIS — R5383 Other fatigue: Secondary | ICD-10-CM | POA: Insufficient documentation

## 2018-11-16 DIAGNOSIS — I10 Essential (primary) hypertension: Secondary | ICD-10-CM | POA: Insufficient documentation

## 2018-11-16 DIAGNOSIS — Z7901 Long term (current) use of anticoagulants: Secondary | ICD-10-CM | POA: Insufficient documentation

## 2018-11-16 DIAGNOSIS — Z5111 Encounter for antineoplastic chemotherapy: Secondary | ICD-10-CM | POA: Diagnosis present

## 2018-11-16 DIAGNOSIS — Z452 Encounter for adjustment and management of vascular access device: Secondary | ICD-10-CM | POA: Insufficient documentation

## 2018-11-16 DIAGNOSIS — D638 Anemia in other chronic diseases classified elsewhere: Secondary | ICD-10-CM | POA: Insufficient documentation

## 2018-11-16 DIAGNOSIS — C183 Malignant neoplasm of hepatic flexure: Secondary | ICD-10-CM | POA: Insufficient documentation

## 2018-11-16 DIAGNOSIS — I4891 Unspecified atrial fibrillation: Secondary | ICD-10-CM | POA: Diagnosis not present

## 2018-11-16 MED ORDER — DIPHENHYDRAMINE HCL 25 MG PO CAPS
50.0000 mg | ORAL_CAPSULE | Freq: Once | ORAL | Status: AC
  Administered 2018-11-16: 50 mg via ORAL

## 2018-11-16 MED ORDER — FAMOTIDINE IN NACL 20-0.9 MG/50ML-% IV SOLN
INTRAVENOUS | Status: AC
  Filled 2018-11-16: qty 50

## 2018-11-16 MED ORDER — DIPHENHYDRAMINE HCL 25 MG PO CAPS
ORAL_CAPSULE | ORAL | Status: AC
  Filled 2018-11-16: qty 2

## 2018-11-16 MED ORDER — IRON SUCROSE 20 MG/ML IV SOLN
200.0000 mg | Freq: Once | INTRAVENOUS | Status: AC
Start: 2018-11-16 — End: 2018-11-16
  Administered 2018-11-16: 200 mg via INTRAVENOUS
  Filled 2018-11-16: qty 10

## 2018-11-16 MED ORDER — FAMOTIDINE IN NACL 20-0.9 MG/50ML-% IV SOLN
20.0000 mg | Freq: Once | INTRAVENOUS | Status: AC
  Administered 2018-11-16: 20 mg via INTRAVENOUS

## 2018-11-16 MED ORDER — ACETAMINOPHEN 325 MG PO TABS
ORAL_TABLET | ORAL | Status: AC
  Filled 2018-11-16: qty 2

## 2018-11-16 MED ORDER — ACETAMINOPHEN 325 MG PO TABS
650.0000 mg | ORAL_TABLET | Freq: Once | ORAL | Status: AC
  Administered 2018-11-16: 650 mg via ORAL

## 2018-11-16 MED ORDER — METHYLPREDNISOLONE SODIUM SUCC 125 MG IJ SOLR
INTRAMUSCULAR | Status: AC
  Filled 2018-11-16: qty 2

## 2018-11-16 MED ORDER — METHYLPREDNISOLONE SODIUM SUCC 125 MG IJ SOLR
125.0000 mg | Freq: Once | INTRAMUSCULAR | Status: AC
  Administered 2018-11-16: 125 mg via INTRAVENOUS

## 2018-11-16 MED ORDER — SODIUM CHLORIDE 0.9 % IV SOLN
Freq: Once | INTRAVENOUS | Status: AC
  Administered 2018-11-16: 10:00:00 via INTRAVENOUS
  Filled 2018-11-16: qty 250

## 2018-11-16 NOTE — Patient Instructions (Signed)
Iron Sucrose injection What is this medicine? IRON SUCROSE (AHY ern SOO krohs) is an iron complex. Iron is used to make healthy red blood cells, which carry oxygen and nutrients throughout the body. This medicine is used to treat iron deficiency anemia in people with chronic kidney disease. This medicine may be used for other purposes; ask your health care provider or pharmacist if you have questions. COMMON BRAND NAME(S): Venofer What should I tell my health care provider before I take this medicine? They need to know if you have any of these conditions: -anemia not caused by low iron levels -heart disease -high levels of iron in the blood -kidney disease -liver disease -an unusual or allergic reaction to iron, other medicines, foods, dyes, or preservatives -pregnant or trying to get pregnant -breast-feeding How should I use this medicine? This medicine is for infusion into a vein. It is given by a health care professional in a hospital or clinic setting. Talk to your pediatrician regarding the use of this medicine in children. While this drug may be prescribed for children as young as 2 years for selected conditions, precautions do apply. Overdosage: If you think you have taken too much of this medicine contact a poison control center or emergency room at once. NOTE: This medicine is only for you. Do not share this medicine with others. What if I miss a dose? It is important not to miss your dose. Call your doctor or health care professional if you are unable to keep an appointment. What may interact with this medicine? Do not take this medicine with any of the following medications: -deferoxamine -dimercaprol -other iron products This medicine may also interact with the following medications: -chloramphenicol -deferasirox This list may not describe all possible interactions. Give your health care provider a list of all the medicines, herbs, non-prescription drugs, or dietary  supplements you use. Also tell them if you smoke, drink alcohol, or use illegal drugs. Some items may interact with your medicine. What should I watch for while using this medicine? Visit your doctor or healthcare professional regularly. Tell your doctor or healthcare professional if your symptoms do not start to get better or if they get worse. You may need blood work done while you are taking this medicine. You may need to follow a special diet. Talk to your doctor. Foods that contain iron include: whole grains/cereals, dried fruits, beans, or peas, leafy green vegetables, and organ meats (liver, kidney). What side effects may I notice from receiving this medicine? Side effects that you should report to your doctor or health care professional as soon as possible: -allergic reactions like skin rash, itching or hives, swelling of the face, lips, or tongue -breathing problems -changes in blood pressure -cough -fast, irregular heartbeat -feeling faint or lightheaded, falls -fever or chills -flushing, sweating, or hot feelings -joint or muscle aches/pains -seizures -swelling of the ankles or feet -unusually weak or tired Side effects that usually do not require medical attention (report to your doctor or health care professional if they continue or are bothersome): -diarrhea -feeling achy -headache -irritation at site where injected -nausea, vomiting -stomach upset -tiredness This list may not describe all possible side effects. Call your doctor for medical advice about side effects. You may report side effects to FDA at 1-800-FDA-1088. Where should I keep my medicine? This drug is given in a hospital or clinic and will not be stored at home. NOTE: This sheet is a summary. It may not cover all possible information. If   you have questions about this medicine, talk to your doctor, pharmacist, or health care provider.  2019 Elsevier/Gold Standard (2011-08-10 17:14:35)  

## 2018-11-18 ENCOUNTER — Other Ambulatory Visit: Payer: Self-pay | Admitting: Radiology

## 2018-11-18 ENCOUNTER — Other Ambulatory Visit: Payer: Self-pay

## 2018-11-18 NOTE — Progress Notes (Unsigned)
p 

## 2018-11-18 NOTE — Progress Notes (Signed)
FMLA successfully faxed to [employer] at [phone]. Mailed copy to patient address on file.

## 2018-11-19 ENCOUNTER — Other Ambulatory Visit: Payer: Self-pay | Admitting: Hematology

## 2018-11-19 ENCOUNTER — Other Ambulatory Visit: Payer: Self-pay | Admitting: Radiology

## 2018-11-19 DIAGNOSIS — G894 Chronic pain syndrome: Secondary | ICD-10-CM | POA: Diagnosis not present

## 2018-11-19 DIAGNOSIS — N183 Chronic kidney disease, stage 3 (moderate): Secondary | ICD-10-CM | POA: Diagnosis not present

## 2018-11-19 DIAGNOSIS — E1121 Type 2 diabetes mellitus with diabetic nephropathy: Secondary | ICD-10-CM | POA: Diagnosis not present

## 2018-11-19 DIAGNOSIS — E079 Disorder of thyroid, unspecified: Secondary | ICD-10-CM | POA: Diagnosis not present

## 2018-11-19 DIAGNOSIS — C186 Malignant neoplasm of descending colon: Secondary | ICD-10-CM

## 2018-11-20 ENCOUNTER — Ambulatory Visit (HOSPITAL_COMMUNITY)
Admission: RE | Admit: 2018-11-20 | Discharge: 2018-11-20 | Disposition: A | Discharge status: Home / Self Care | Payer: Medicare HMO | Source: Ambulatory Visit | Attending: Hematology | Admitting: Hematology

## 2018-11-20 ENCOUNTER — Encounter (HOSPITAL_COMMUNITY): Payer: Self-pay

## 2018-11-20 ENCOUNTER — Other Ambulatory Visit: Payer: Self-pay | Admitting: Hematology

## 2018-11-20 DIAGNOSIS — Z5111 Encounter for antineoplastic chemotherapy: Secondary | ICD-10-CM | POA: Diagnosis not present

## 2018-11-20 DIAGNOSIS — Z794 Long term (current) use of insulin: Secondary | ICD-10-CM | POA: Insufficient documentation

## 2018-11-20 DIAGNOSIS — E119 Type 2 diabetes mellitus without complications: Secondary | ICD-10-CM | POA: Insufficient documentation

## 2018-11-20 DIAGNOSIS — C186 Malignant neoplasm of descending colon: Secondary | ICD-10-CM | POA: Diagnosis not present

## 2018-11-20 DIAGNOSIS — I10 Essential (primary) hypertension: Secondary | ICD-10-CM | POA: Diagnosis not present

## 2018-11-20 DIAGNOSIS — K769 Liver disease, unspecified: Secondary | ICD-10-CM | POA: Diagnosis present

## 2018-11-20 DIAGNOSIS — Z7901 Long term (current) use of anticoagulants: Secondary | ICD-10-CM | POA: Diagnosis not present

## 2018-11-20 DIAGNOSIS — Z79899 Other long term (current) drug therapy: Secondary | ICD-10-CM | POA: Diagnosis not present

## 2018-11-20 DIAGNOSIS — D134 Benign neoplasm of liver: Secondary | ICD-10-CM | POA: Diagnosis not present

## 2018-11-20 DIAGNOSIS — K7689 Other specified diseases of liver: Secondary | ICD-10-CM | POA: Diagnosis not present

## 2018-11-20 DIAGNOSIS — R160 Hepatomegaly, not elsewhere classified: Secondary | ICD-10-CM | POA: Insufficient documentation

## 2018-11-20 DIAGNOSIS — C189 Malignant neoplasm of colon, unspecified: Secondary | ICD-10-CM | POA: Diagnosis not present

## 2018-11-20 DIAGNOSIS — K759 Inflammatory liver disease, unspecified: Secondary | ICD-10-CM | POA: Diagnosis not present

## 2018-11-20 HISTORY — PX: IR IMAGING GUIDED PORT INSERTION: IMG5740

## 2018-11-20 LAB — COMPREHENSIVE METABOLIC PANEL
ALBUMIN: 4.2 g/dL (ref 3.5–5.0)
ALT: 11 U/L (ref 0–44)
AST: 13 U/L — ABNORMAL LOW (ref 15–41)
Alkaline Phosphatase: 45 U/L (ref 38–126)
Anion gap: 9 (ref 5–15)
BUN: 31 mg/dL — ABNORMAL HIGH (ref 8–23)
CALCIUM: 8.7 mg/dL — AB (ref 8.9–10.3)
CO2: 26 mmol/L (ref 22–32)
Chloride: 107 mmol/L (ref 98–111)
Creatinine, Ser: 0.95 mg/dL (ref 0.44–1.00)
GFR calc Af Amer: >60 mL/min (ref >60)
GFR calc non Af Amer: 60 mL/min — ABNORMAL LOW (ref >60)
GLUCOSE: 137 mg/dL — AB (ref 70–99)
Potassium: 4.2 mmol/L (ref 3.5–5.1)
Sodium: 142 mmol/L (ref 135–145)
Total Bilirubin: 0.7 mg/dL (ref 0.3–1.2)
Total Protein: 6.9 g/dL (ref 6.5–8.1)

## 2018-11-20 LAB — CBC WITH DIFFERENTIAL/PLATELET
Abs Immature Granulocytes: 0.05 10*3/uL (ref 0.00–0.07)
Basophils Absolute: 0.1 10*3/uL (ref 0.0–0.1)
Basophils Relative: 1 %
Eosinophils Absolute: 0.1 10*3/uL (ref 0.0–0.5)
Eosinophils Relative: 2 %
HCT: 36.1 % (ref 36.0–46.0)
Hemoglobin: 10.6 g/dL — ABNORMAL LOW (ref 12.0–15.0)
Immature Granulocytes: 1 %
Lymphocytes Relative: 8 %
Lymphs Abs: 0.7 10*3/uL (ref 0.7–4.0)
MCH: 29.9 pg (ref 26.0–34.0)
MCHC: 29.4 g/dL — ABNORMAL LOW (ref 30.0–36.0)
MCV: 102 fL — ABNORMAL HIGH (ref 80.0–100.0)
Monocytes Absolute: 0.8 10*3/uL (ref 0.1–1.0)
Monocytes Relative: 9 %
NEUTROS ABS: 6.9 10*3/uL (ref 1.7–7.7)
Neutrophils Relative %: 79 %
Platelets: 285 10*3/uL (ref 150–400)
RBC: 3.54 MIL/uL — ABNORMAL LOW (ref 3.87–5.11)
RDW: 14.5 % (ref 11.5–15.5)
WBC: 8.6 10*3/uL (ref 4.0–10.5)
nRBC: 0 % (ref 0.0–0.2)

## 2018-11-20 LAB — GLUCOSE, CAPILLARY: Glucose-Capillary: 126 mg/dL — ABNORMAL HIGH (ref 70–99)

## 2018-11-20 LAB — PROTIME-INR
INR: 1.19
Prothrombin Time: 15 seconds (ref 11.4–15.2)

## 2018-11-20 MED ORDER — CEFAZOLIN SODIUM-DEXTROSE 2-4 GM/100ML-% IV SOLN
INTRAVENOUS | Status: AC
  Administered 2018-11-20: 2 g via INTRAVENOUS
  Filled 2018-11-20: qty 100

## 2018-11-20 MED ORDER — HEPARIN SOD (PORK) LOCK FLUSH 100 UNIT/ML IV SOLN
INTRAVENOUS | Status: AC
  Filled 2018-11-20: qty 5

## 2018-11-20 MED ORDER — HEPARIN SOD (PORK) LOCK FLUSH 100 UNIT/ML IV SOLN
INTRAVENOUS | Status: AC | PRN
  Administered 2018-11-20: 500 [IU]

## 2018-11-20 MED ORDER — FENTANYL CITRATE (PF) 100 MCG/2ML IJ SOLN
INTRAMUSCULAR | Status: AC | PRN
  Administered 2018-11-20 (×2): 25 ug via INTRAVENOUS

## 2018-11-20 MED ORDER — LIDOCAINE HCL (PF) 1 % IJ SOLN
INTRAMUSCULAR | Status: AC | PRN
  Administered 2018-11-20 (×2): 10 mL via SUBCUTANEOUS

## 2018-11-20 MED ORDER — MIDAZOLAM HCL 2 MG/2ML IJ SOLN
INTRAMUSCULAR | Status: AC
  Filled 2018-11-20: qty 4

## 2018-11-20 MED ORDER — FENTANYL CITRATE (PF) 100 MCG/2ML IJ SOLN
INTRAMUSCULAR | Status: AC | PRN
  Administered 2018-11-20 (×2): 50 ug via INTRAVENOUS

## 2018-11-20 MED ORDER — SODIUM CHLORIDE 0.9 % IV SOLN
INTRAVENOUS | Status: DC
  Administered 2018-11-20: 12:00:00 via INTRAVENOUS

## 2018-11-20 MED ORDER — MIDAZOLAM HCL 2 MG/2ML IJ SOLN
INTRAMUSCULAR | Status: AC | PRN
  Administered 2018-11-20 (×2): 1 mg via INTRAVENOUS

## 2018-11-20 MED ORDER — CEFAZOLIN SODIUM-DEXTROSE 2-4 GM/100ML-% IV SOLN
2.0000 g | INTRAVENOUS | Status: AC
  Administered 2018-11-20: 2 g via INTRAVENOUS

## 2018-11-20 MED ORDER — GELATIN ABSORBABLE 12-7 MM EX MISC
CUTANEOUS | Status: AC
  Filled 2018-11-20: qty 1

## 2018-11-20 MED ORDER — LIDOCAINE HCL 1 % IJ SOLN
INTRAMUSCULAR | Status: AC
  Filled 2018-11-20: qty 20

## 2018-11-20 MED ORDER — FENTANYL CITRATE (PF) 100 MCG/2ML IJ SOLN
INTRAMUSCULAR | Status: AC
  Filled 2018-11-20: qty 4

## 2018-11-20 MED ORDER — LIDOCAINE-EPINEPHRINE (PF) 2 %-1:200000 IJ SOLN
INTRAMUSCULAR | Status: AC
  Filled 2018-11-20: qty 20

## 2018-11-20 NOTE — Procedures (Signed)
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

## 2018-11-20 NOTE — Discharge Instructions (Signed)
Moderate Conscious Sedation, Adult, Care After °These instructions provide you with information about caring for yourself after your procedure. Your health care provider may also give you more specific instructions. Your treatment has been planned according to current medical practices, but problems sometimes occur. Call your health care provider if you have any problems or questions after your procedure. °What can I expect after the procedure? °After your procedure, it is common: °· To feel sleepy for several hours. °· To feel clumsy and have poor balance for several hours. °· To have poor judgment for several hours. °· To vomit if you eat too soon. °Follow these instructions at home: °For at least 24 hours after the procedure: ° °· Do not: °? Participate in activities where you could fall or become injured. °? Drive. °? Use heavy machinery. °? Drink alcohol. °? Take sleeping pills or medicines that cause drowsiness. °? Make important decisions or sign legal documents. °? Take care of children on your own. °· Rest. °Eating and drinking °· Follow the diet recommended by your health care provider. °· If you vomit: °? Drink water, juice, or soup when you can drink without vomiting. °? Make sure you have little or no nausea before eating solid foods. °General instructions °· Have a responsible adult stay with you until you are awake and alert. °· Take over-the-counter and prescription medicines only as told by your health care provider. °· If you smoke, do not smoke without supervision. °· Keep all follow-up visits as told by your health care provider. This is important. °Contact a health care provider if: °· You keep feeling nauseous or you keep vomiting. °· You feel light-headed. °· You develop a rash. °· You have a fever. °Get help right away if: °· You have trouble breathing. °This information is not intended to replace advice given to you by your health care provider. Make sure you discuss any questions you have  with your health care provider. °Document Released: 08/20/2013 Document Revised: 04/03/2016 Document Reviewed: 02/19/2016 °Elsevier Interactive Patient Education © 2019 [employer] ° ° °Implanted Port Insertion, Care After °This sheet gives you information about how to care for yourself after your procedure. Your health care provider may also give you more specific instructions. If you have problems or questions, contact your health care provider. °What can I expect after the procedure? °After the procedure, it is common to have: °· Discomfort at the port insertion site. °· Bruising on the skin over the port. This should improve over 3-4 days. °Follow these instructions at home: °Port care °· After your port is placed, you will get a manufacturer's information card. The card has information about your port. Keep this card with you at all times. °· Take care of the port as told by your health care provider. Ask your health care provider if you or a family member can get training for taking care of the port at home. A home health care nurse may also take care of the port. °· Make sure to remember what type of port you have. °Incision care ° °  ° °· Follow instructions from your health care provider about how to take care of your port insertion site. Make sure you: °? Wash your hands with soap and water before and after you change your bandage (dressing). If soap and water are not available, use hand sanitizer. °? Change your dressing as told by your health care provider.  You may remove your dressing tomorrow. °? Leave stitches (sutures), skin   glue, or adhesive strips in place. These skin closures may need to stay in place for 2 weeks or longer. If adhesive strip edges start to loosen and curl up, you may trim the loose edges. Do not remove adhesive strips completely unless your health care provider tells you to do that.  Do not use EMLA cream for 2 weeks after port placement as this cream will remove surgical glue  on your incision.  Check your port insertion site every day for signs of infection. Check for: ? Redness, swelling, or pain. ? Fluid or blood. ? Warmth. ? Pus or a bad smell. Activity  Return to your normal activities as told by your health care provider. Ask your health care provider what activities are safe for you.  Do not lift anything that is heavier than 10 lb (4.5 kg), or the limit that you are told, until your health care provider says that it is safe. General instructions  Take over-the-counter and prescription medicines only as told by your health care provider.  Do not take baths, swim, or use a hot tub until your health care provider approves. Ask your health care provider if you may take showers. You may only be allowed to take sponge baths.  You may shower tomorrow.  Do not drive for 24 hours if you were given a sedative during your procedure.  Wear a medical alert bracelet in case of an emergency. This will tell any health care providers that you have a port.  Keep all follow-up visits as told by your health care provider. This is important. Contact a health care provider if:  You cannot flush your port with saline as directed, or you cannot draw blood from the port.  You have a fever or chills.  You have redness, swelling, or pain around your port insertion site.  You have fluid or blood coming from your port insertion site.  Your port insertion site feels warm to the touch.  You have pus or a bad smell coming from the port insertion site. Get help right away if:  You have chest pain or shortness of breath.  You have bleeding from your port that you cannot control. Summary  Take care of the port as told by your health care provider. Keep the manufacturer's information card with you at all times.  Change your dressing as told by your health care provider.  Contact a health care provider if you have a fever or chills or if you have redness, swelling, or  pain around your port insertion site.  Keep all follow-up visits as told by your health care provider. This information is not intended to replace advice given to you by your health care provider. Make sure you discuss any questions you have with your health care provider. Document Released: 08/20/2013 Document Revised: 05/28/2018 Document Reviewed: 05/28/2018 Elsevier Interactive Patient Education  2019 [employer].   Liver Biopsy, Care After These instructions give you information on caring for yourself after your procedure. Your doctor may also give you more specific instructions. Call your doctor if you have any problems or questions after your procedure. What can I expect after the procedure? After the procedure, it is common to have:  Pain and soreness where the biopsy was done.  Bruising around the area where the biopsy was done.  Sleepiness and be tired for a few days. Follow these instructions at home: Medicines  Take over-the-counter and prescription medicines only as told by your doctor.  If you  were prescribed an antibiotic medicine, take it as told by your doctor. Do not stop taking the antibiotic even if you start to feel better.  Do not take medicines such as aspirin and ibuprofen. These medicines can thin your blood. Do not take these medicines unless your doctor tells you to take them.  If you are taking prescription pain medicine, take actions to prevent or treat constipation. Your doctor may recommend that you: ? Drink enough fluid to keep your pee (urine) clear or pale yellow. ? Take over-the-counter or prescription medicines. ? Eat foods that are high in fiber, such as fresh fruits and vegetables, whole grains, and beans. ? Limit foods that are high in fat and processed sugars, such as fried and sweet foods. Caring for your cut  Follow instructions from your doctor about how to take care of your cuts from surgery (incisions). Make sure you: ? Wash your hands  with soap and water before you change your bandage (dressing). If you cannot use soap and water, use hand sanitizer. ? Change your bandage as told by your doctor. ? Leave stitches (sutures), skin glue, or skin tape (adhesive) strips in place. They may need to stay in place for 2 weeks or longer. If tape strips get loose and curl up, you may trim the loose edges. Do not remove tape strips completely unless your doctor says it is okay.  Check your cuts every day for signs of infection. Check for: ? Redness, swelling, or more pain. ? Fluid or blood. ? Pus or a bad smell. ? Warmth.  Do not take baths, swim, or use a hot tub until your doctor says it is okay to do so. Activity   Rest at home for 1-2 days or as told by your doctor. ? Avoid sitting for a long time without moving. Get up to take short walks every 1-2 hours.  Return to your normal activities as told by your doctor. Ask what activities are safe for you.  Do not do these things in the first 24 hours: ? Drive. ? Use machinery. ? Take a bath or shower.  Do not lift more than 10 pounds (4.5 kg) or play contact sports for the first 2 weeks. General instructions   Do not drink alcohol in the first week after the procedure.  Have someone stay with you for at least 24 hours after the procedure.  Get your test results. Ask your doctor or the department that is doing the test: ? When will my results be ready? ? How will I get my results? ? What are my treatment options? ? What other tests do I need? ? What are my next steps?  Keep all follow-up visits as told by your doctor. This is important. Contact a doctor if:  A cut bleeds and leaves more than just a small spot of blood.  A cut is red, puffs up (swells), or hurts more than before.  Fluid or something else comes from a cut.  A cut smells bad.  You have a fever or chills. Get help right away if:  You have swelling, bloating, or pain in your belly (abdomen).  You  get dizzy or faint.  You have a rash.  You feel sick to your stomach (nauseous) or throw up (vomit).  You have trouble breathing, feel short of breath, or feel faint.  Your chest hurts.  You have problems talking or seeing.  You have trouble with your balance or moving your arms or  legs. Summary  After the procedure, it is common to have pain, soreness, bruising, and tiredness.  Your doctor will tell you how to take care of yourself at home. Change your bandage, take your medicines, and limit your activities as told by your doctor.  Call your doctor if you have symptoms of infection. Get help right away if your belly swells, your cut bleeds a lot, or you have trouble talking or breathing. This information is not intended to replace advice given to you by your health care provider. Make sure you discuss any questions you have with your health care provider. Document Released: 08/08/2008 Document Revised: 11/09/2017 Document Reviewed: 11/09/2017 Elsevier Interactive Patient Education  2019 [employer].

## 2018-11-20 NOTE — Consult Note (Signed)
Chief Complaint: Patient was seen in consultation today for image guided liver lesion biopsy as well as Port-A-Cath placement  Referring Physician(s): Feng,Yan  Supervising Physician: Daryll Brod  Patient Status: [facility] - Out-pt  History of Present Illness: Cassandra Allen is a 73 y.o. female with history of recently diagnosed colon carcinoma who presents today for image guided liver lesion biopsy and Port-A-Cath placement.  Recent imaging has revealed right paratracheal/ pelvic/retroperitoneal adenopathy, scattered pulmonary parenchymal changes suspicious for metastatic disease, abnormal appearance of the thyroid gland with left lower lobe ill-defined mass, numerous liver/splenic lesions, and left adrenal mass.  Past Medical History:  Diagnosis Date  . Arthritis   . Cancer ([place])   . Diabetes mellitus without complication ([place])   . Hypertension     No past surgical history on file.  Allergies: Metformin and related and Prednisone  Medications: Prior to Admission medications   Medication Sig Start Date End Date Taking? Authorizing Provider  apixaban (ELIQUIS) 5 MG TABS tablet TAKE 1 TABLET (5 MG TOTAL) BY MOUTH 2 (TWO) TIMES DAILY. 09/19/18   [name], NP  atorvastatin (LIPITOR) 10 MG tablet Take 1 tablet (10 mg total) by mouth daily at 6 PM. 11/03/18   Amin, Jeanella Flattery, MD  beta carotene w/minerals (OCUVITE) tablet Take 1 tablet by mouth daily at 12 noon.    [provider]  cholecalciferol (VITAMIN D) 1000 units tablet Take 1,000 Units by mouth at bedtime.    [provider]  diltiazem (CARDIZEM CD) 120 MG 24 hr capsule Take 1 capsule (120 mg total) by mouth 2 (two) times daily. 11/15/18   [name], NP  diphenoxylate-atropine (LOMOTIL) 2.5-0.025 MG tablet Take 1 to 2 tablets 4 times daily as needed 09/18/18   Alla Feeling, NP  ferrous sulfate 325 (65 FE) MG EC tablet Take 325 mg by mouth daily with breakfast.     [provider]  HYDROcodone-acetaminophen (NORCO) 7.5-325 MG tablet Take 1 tablet by mouth 2 (two) times daily as needed (for pain.).     [provider]  Insulin Detemir (LEVEMIR FLEXTOUCH) 100 UNIT/ML Pen Inject 10 Units into the skin daily.    [provider]  liraglutide (VICTOZA) 18 MG/3ML SOPN Inject 1.8 mg into the skin every morning.    [provider]  losartan-hydrochlorothiazide (HYZAAR) 100-12.5 MG tablet Take 1 tablet by mouth daily.     [provider]  Multiple Vitamin (MULTIVITAMIN WITH MINERALS) TABS tablet Take 2 tablets by mouth daily at 12 noon.     [provider]  oxybutynin (DITROPAN) 5 MG tablet Take 20 mg by mouth daily.    [provider]  oxybutynin (OXYTROL) 3.9 MG/24HR Place 1 patch onto the skin 2 (two) times a week.    [provider]  sertraline (ZOLOFT) 100 MG tablet Take 100 mg by mouth daily at 12 noon.     [provider]  simethicone (MYLICON) 80 MG chewable tablet Chew 80-160 mg by mouth 2 (two) times daily as needed for flatulence.  02/23/15   [provider]  simvastatin (ZOCOR) 20 MG tablet Take 20 mg by mouth daily.    [provider]     No family history on file.  Social History   Socioeconomic History  . Marital status: Divorced    Spouse name: Not on file  . Number of children: 2  . Years of education: Not on file  . Highest education level: Not on file  Occupational History  . Not on file  Social Needs  . Financial resource strain: Not on file  . Food insecurity:    Worry: Not on file    Inability: Not on file  . Transportation needs:    Medical: Not on file    Non-medical: Not on file  Tobacco Use  . Smoking status: Never Smoker  . Smokeless tobacco: Never Used  Substance and Sexual Activity  . Alcohol use: No  . Drug use: No  . Sexual activity: Not on file  Lifestyle  . Physical activity:    Days per week: Not on file    Minutes per session: Not on  file  . Stress: Not on file  Relationships  . Social connections:    Talks on phone: Not on file    Gets together: Not on file    Attends religious service: Not on file    Active member of club or organization: Not on file    Attends meetings of clubs or organizations: Not on file    Relationship status: Not on file  Other Topics Concern  . Not on file  Social History Narrative  . Not on file      Review of Systems currently denies fever, headache, chest pain, dyspnea, cough, abdominal/back pain, nausea, vomiting or bleeding.  Vital Signs: Blood pressure 143/65, heart rate 79, temp 98.6, respirations 16, O2 sat 98% room air   Physical Exam awake, alert.  Chest clear to auscultation bilaterally anteriorly.  Heart with normal rate, irregular rhythm.  Abdomen soft, positive bowel sounds, nontender.  Some trace pretibial edema along with chronic erythema .  Imaging: Ct Chest W Contrast  Result Date: 11/01/2018 CLINICAL DATA:  73 year old female with colonic mass is seen on colonoscopy 1 week ago. Anemia in GI bleed for 6 months. No prior history of cancer. Initial encounter. EXAM: CT CHEST, ABDOMEN, AND PELVIS WITH CONTRAST TECHNIQUE: Multidetector CT imaging of the chest, abdomen and pelvis was performed following the standard protocol during bolus administration of intravenous contrast. CONTRAST:  144mL OMNIPAQUE IOHEXOL 300 MG/ML  SOLN COMPARISON:  05/10/2018 chest x-ray.  No comparison CT. FINDINGS: CT CHEST FINDINGS Cardiovascular: Cardiomegaly. Prominent epicardial fat. Coronary artery calcification. Calcified aortic and mitral valve. Atherosclerotic changes aorta and great vessels. Ascending thoracic aorta measures up to 3.2 cm. No central pulmonary embolus noted. Mediastinum/Nodes: Abnormal appearance of the thyroid gland with left lower lobe ill-defined mass spanning over 3 cm. Right paratracheal adenopathy (immediately superior to the azygos vein) which short axis dimension of 1.2  cm. Aortic pulmonary window top-normal size lymph node. Lungs/Pleura: Small number of calcified granulomas. Scattered areas of scarring/subsegmental atelectasis. Scattered lungs cyst. No focal mass suspicious for pulmonary metastatic disease. Lung base atelectasis. Trachea and mainstem bronchi are patent. Musculoskeletal: Degenerative changes thoracic spine. No osseous destructive lesion. CT ABDOMEN PELVIS FINDINGS Hepatobiliary: Numerous hepatic lesions suspicious for metastatic disease largest within the caudate lobe measuring up to 3.2 cm. Liver top-normal size. Gallbladder wall thickening. This may be related to increased right heart pressure or liver disease but could not exclude cholecystitis in the proper clinical setting. Slight haziness porta hepatis region. Pancreas: No worrisome pancreatic lesion. Spleen: Numerous splenic lesions which in the present clinical setting is suspicious for combination of metastatic disease and splenic cysts. Adrenals/Urinary Tract: No obstructing stone or hydronephrosis. Renal low-density structures too small to characterize possibly cyst. 3.2 cm left adrenal mass suspicious for metastatic disease. No right adrenal mass. Gas within the urinary bladder may  be related to recent manipulation. No adjacent fistula identified. Stomach/Bowel: Question sigmoid colon and possibly proximal descending colon mass. Rectosigmoid colon mass also not excluded. Correlation with colonoscopy results recommended. Prominent number of colonic diverticula. Slight third spacing of fluid makes it difficult to evaluate for the possibility of diverticulitis. Radiopaque 1.5 cm structure within the right colon may be related to ingested foreign body. Under distended stomach without gross abnormality. Vascular/Lymphatic: Atherosclerotic changes aorta and aortic branch vessels without abdominal aortic aneurysm or large vessel occlusion. Low-density fatty appearing structures in the external iliac  region/pelvic sidewall bilaterally with adjacent low-density iliac lymph nodes and retroperitoneal lymph nodes raises possibility of low-density metastatic adenopathy. Prominent size portacaval lymph node which short axis dimension of 1.3 cm. Reproductive: Left adnexal 2.4 cm cyst. Other: Third spacing of fluid. No free air. Lax abdominal musculature without bowel containing hernia. Musculoskeletal: Degenerative changes lumbar spine most notable L4-5 and L5-S1 with 5 mm anterior slip L4 secondary to facet degenerative changes. Hip joint degenerative changes. No osseous destructive lesion. IMPRESSION: CT CHEST: 1. Right paratracheal adenopathy (immediately superior to the azygos vein) which short axis dimension of 1.2 cm. In the present clinical setting it is possible this is related to metastatic involvement. 2. Scattered pulmonary parenchymal changes none of which are highly suspicious for metastatic disease. 3. Abnormal appearance of the thyroid gland with left lower lobe ill-defined mass spanning over 3 cm. Thyroid ultrasound can be performed for further delineation. 4. Cardiomegaly. Coronary artery calcification. 5.  Aortic Atherosclerosis (ICD10-I70.0). CT ABDOMEN PELVIS: 1. Numerous hepatic lesions suspicious for metastatic disease largest within the caudate lobe measuring up to 3.2 cm. 2. Gallbladder wall thickening. This may be related to increased right heart pressure or liver disease but could not exclude cholecystitis in the proper clinical setting. 3. Numerous splenic lesions which in the present clinical setting is suspicious for combination of metastatic disease and splenic cysts. 4. 3.2 cm left adrenal mass suspicious for metastatic disease. 5. Question sigmoid colon and possibly proximal descending colon mass. Rectosigmoid colon mass also not excluded. Correlation with colonoscopy results recommended. 6. Prominent number of colonic diverticula. Third spacing of fluid makes it difficult to evaluate  for the possibility of diverticulitis. Radiopaque 1.5 cm structure within the right colon may be related to ingested foreign body. 7. Low-density fatty appearing structures in the external iliac region/pelvic sidewall bilaterally with adjacent low-density iliac lymph nodes and retroperitoneal lymph nodes raises possibility of low-density metastatic adenopathy. Prominent size portacaval lymph node which short axis dimension of 1.3 cm. PET-CT could be obtained for further delineation if clinically desired. 8. Gas within the urinary bladder may be related to recent manipulation. Clinical correlation recommended. 9. Left adnexal 2.4 cm cyst. This can be assessed with pelvic sonogram. These results will be called to the ordering clinician or representative by the Radiologist Assistant, and communication documented in the PACS or zVision Dashboard. Electronically Signed   By: Genia Del M.D.   On: 11/01/2018 13:13   Ct Abdomen Pelvis W Contrast  Result Date: 11/01/2018 CLINICAL DATA:  73 year old female with colonic mass is seen on colonoscopy 1 week ago. Anemia in GI bleed for 6 months. No prior history of cancer. Initial encounter. EXAM: CT CHEST, ABDOMEN, AND PELVIS WITH CONTRAST TECHNIQUE: Multidetector CT imaging of the chest, abdomen and pelvis was performed following the standard protocol during bolus administration of intravenous contrast. CONTRAST:  143mL OMNIPAQUE IOHEXOL 300 MG/ML  SOLN COMPARISON:  05/10/2018 chest x-ray.  No comparison CT. FINDINGS: CT  CHEST FINDINGS Cardiovascular: Cardiomegaly. Prominent epicardial fat. Coronary artery calcification. Calcified aortic and mitral valve. Atherosclerotic changes aorta and great vessels. Ascending thoracic aorta measures up to 3.2 cm. No central pulmonary embolus noted. Mediastinum/Nodes: Abnormal appearance of the thyroid gland with left lower lobe ill-defined mass spanning over 3 cm. Right paratracheal adenopathy (immediately superior to the azygos  vein) which short axis dimension of 1.2 cm. Aortic pulmonary window top-normal size lymph node. Lungs/Pleura: Small number of calcified granulomas. Scattered areas of scarring/subsegmental atelectasis. Scattered lungs cyst. No focal mass suspicious for pulmonary metastatic disease. Lung base atelectasis. Trachea and mainstem bronchi are patent. Musculoskeletal: Degenerative changes thoracic spine. No osseous destructive lesion. CT ABDOMEN PELVIS FINDINGS Hepatobiliary: Numerous hepatic lesions suspicious for metastatic disease largest within the caudate lobe measuring up to 3.2 cm. Liver top-normal size. Gallbladder wall thickening. This may be related to increased right heart pressure or liver disease but could not exclude cholecystitis in the proper clinical setting. Slight haziness porta hepatis region. Pancreas: No worrisome pancreatic lesion. Spleen: Numerous splenic lesions which in the present clinical setting is suspicious for combination of metastatic disease and splenic cysts. Adrenals/Urinary Tract: No obstructing stone or hydronephrosis. Renal low-density structures too small to characterize possibly cyst. 3.2 cm left adrenal mass suspicious for metastatic disease. No right adrenal mass. Gas within the urinary bladder may be related to recent manipulation. No adjacent fistula identified. Stomach/Bowel: Question sigmoid colon and possibly proximal descending colon mass. Rectosigmoid colon mass also not excluded. Correlation with colonoscopy results recommended. Prominent number of colonic diverticula. Slight third spacing of fluid makes it difficult to evaluate for the possibility of diverticulitis. Radiopaque 1.5 cm structure within the right colon may be related to ingested foreign body. Under distended stomach without gross abnormality. Vascular/Lymphatic: Atherosclerotic changes aorta and aortic branch vessels without abdominal aortic aneurysm or large vessel occlusion. Low-density fatty appearing  structures in the external iliac region/pelvic sidewall bilaterally with adjacent low-density iliac lymph nodes and retroperitoneal lymph nodes raises possibility of low-density metastatic adenopathy. Prominent size portacaval lymph node which short axis dimension of 1.3 cm. Reproductive: Left adnexal 2.4 cm cyst. Other: Third spacing of fluid. No free air. Lax abdominal musculature without bowel containing hernia. Musculoskeletal: Degenerative changes lumbar spine most notable L4-5 and L5-S1 with 5 mm anterior slip L4 secondary to facet degenerative changes. Hip joint degenerative changes. No osseous destructive lesion. IMPRESSION: CT CHEST: 1. Right paratracheal adenopathy (immediately superior to the azygos vein) which short axis dimension of 1.2 cm. In the present clinical setting it is possible this is related to metastatic involvement. 2. Scattered pulmonary parenchymal changes none of which are highly suspicious for metastatic disease. 3. Abnormal appearance of the thyroid gland with left lower lobe ill-defined mass spanning over 3 cm. Thyroid ultrasound can be performed for further delineation. 4. Cardiomegaly. Coronary artery calcification. 5.  Aortic Atherosclerosis (ICD10-I70.0). CT ABDOMEN PELVIS: 1. Numerous hepatic lesions suspicious for metastatic disease largest within the caudate lobe measuring up to 3.2 cm. 2. Gallbladder wall thickening. This may be related to increased right heart pressure or liver disease but could not exclude cholecystitis in the proper clinical setting. 3. Numerous splenic lesions which in the present clinical setting is suspicious for combination of metastatic disease and splenic cysts. 4. 3.2 cm left adrenal mass suspicious for metastatic disease. 5. Question sigmoid colon and possibly proximal descending colon mass. Rectosigmoid colon mass also not excluded. Correlation with colonoscopy results recommended. 6. Prominent number of colonic diverticula. Third spacing of fluid  makes  it difficult to evaluate for the possibility of diverticulitis. Radiopaque 1.5 cm structure within the right colon may be related to ingested foreign body. 7. Low-density fatty appearing structures in the external iliac region/pelvic sidewall bilaterally with adjacent low-density iliac lymph nodes and retroperitoneal lymph nodes raises possibility of low-density metastatic adenopathy. Prominent size portacaval lymph node which short axis dimension of 1.3 cm. PET-CT could be obtained for further delineation if clinically desired. 8. Gas within the urinary bladder may be related to recent manipulation. Clinical correlation recommended. 9. Left adnexal 2.4 cm cyst. This can be assessed with pelvic sonogram. These results will be called to the ordering clinician or representative by the Radiologist Assistant, and communication documented in the PACS or zVision Dashboard. Electronically Signed   By: Genia Del M.D.   On: 11/01/2018 13:13   Dg Chest Port 1 View  Result Date: 11/02/2018 CLINICAL DATA:  Colon cancer diagnosed 3 days ago by colonoscopy, drank contrast for a CT exam and then had a bloody bowel movement EXAM: PORTABLE CHEST 1 VIEW COMPARISON:  Portable exam 1153 hours compared to 05/10/2018 FINDINGS: Enlargement of cardiac silhouette. Atherosclerotic calcification aorta. Mediastinal contours and pulmonary vascularity normal. Lungs clear. No infiltrate, pleural effusion, or pneumothorax. Bones demineralized. IMPRESSION: Enlargement of cardiac silhouette. No acute abnormalities. Electronically Signed   By: Lavonia Dana M.D.   On: 11/02/2018 13:01    Labs:  CBC: Recent Labs    11/03/18 0541 11/08/18 1415 11/11/18 1153 11/20/18 1130  WBC 6.1 6.6 13.1* 8.6  HGB 7.7* 8.7* 9.7* 10.6*  HCT 26.2* 30.1* 34.1* 36.1  PLT 197 219 262 285    COAGS: Recent Labs    11/02/18 1209 11/03/18 0541 11/11/18 1153 11/20/18 1130  INR 2.20 1.27 1.28 1.19  APTT 37*  --   --   --     BMP: Recent  Labs    11/03/18 0541 11/08/18 1415 11/11/18 1153 11/20/18 1130  NA 143 144 142 142  K 4.0 4.7 4.6 4.2  CL 108 108 106 107  CO2 26 30 26 26   GLUCOSE 138* 97 127* 137*  BUN 28* 20 30* 31*  CALCIUM 8.0* 8.9 8.6* 8.7*  CREATININE 0.84 0.91 0.93 0.95  GFRNONAA >60 >60 >60 60*  GFRAA >60 >60 >60 >60    LIVER FUNCTION TESTS: Recent Labs    11/03/18 0541 11/08/18 1415 11/11/18 1153 11/20/18 1130  BILITOT 0.6 0.4 1.3* 0.7  AST 8* 9* 28 13*  ALT 11 10 13 11   ALKPHOS 33* 43 42 45  PROT 4.8* 5.5* 6.8 6.9  ALBUMIN 3.0* 3.3* 4.2 4.2    TUMOR MARKERS: No results for input(s): AFPTM, CEA, CA199, CHROMGRNA in the last 8760 hours.  Assessment and Plan: 73 y.o. female with history of recently diagnosed colon carcinoma who presents today for image guided liver lesion biopsy and Port-A-Cath placement.  Recent imaging has revealed right paratracheal/ pelvic/retroperitoneal adenopathy, scattered pulmonary parenchymal changes suspicious for metastatic disease, abnormal appearance of the thyroid gland with left lower lobe ill-defined mass, numerous liver/splenic lesions, and left adrenal mass.  Details/risks of both procedures, including but not limited to, internal bleeding, infection, injury to adjacent structures discussed with patient with her understanding and consent.    Thank you for this interesting consult.  I greatly enjoyed meeting Cassandra Allen [facility] and look forward to participating in their care.  A copy of this report was sent to the requesting provider on this date.  Electronically Signed: D. Rowe Robert, PA-C 11/20/2018, 12:35  PM   I spent a total of 30 minutes   in face to face in clinical consultation, greater than 50% of which was counseling/coordinating care for image guided liver lesion biopsy and Port-A-Cath placement

## 2018-11-20 NOTE — Procedures (Signed)
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

## 2018-11-22 ENCOUNTER — Other Ambulatory Visit: Payer: Self-pay

## 2018-11-22 ENCOUNTER — Inpatient Hospital Stay (HOSPITAL_BASED_OUTPATIENT_CLINIC_OR_DEPARTMENT_OTHER): Payer: Medicare HMO | Admitting: Hematology

## 2018-11-22 ENCOUNTER — Inpatient Hospital Stay: Payer: Medicare HMO

## 2018-11-22 ENCOUNTER — Encounter: Payer: Self-pay | Admitting: Hematology

## 2018-11-22 VITALS — BP 134/66 | HR 83 | Temp 98.6°F | Resp 17 | Ht 63.0 in | Wt 180.0 lb

## 2018-11-22 DIAGNOSIS — C787 Secondary malignant neoplasm of liver and intrahepatic bile duct: Secondary | ICD-10-CM | POA: Diagnosis not present

## 2018-11-22 DIAGNOSIS — D500 Iron deficiency anemia secondary to blood loss (chronic): Secondary | ICD-10-CM | POA: Diagnosis not present

## 2018-11-22 DIAGNOSIS — D509 Iron deficiency anemia, unspecified: Secondary | ICD-10-CM

## 2018-11-22 DIAGNOSIS — D638 Anemia in other chronic diseases classified elsewhere: Secondary | ICD-10-CM | POA: Diagnosis not present

## 2018-11-22 DIAGNOSIS — I4891 Unspecified atrial fibrillation: Secondary | ICD-10-CM | POA: Diagnosis not present

## 2018-11-22 DIAGNOSIS — C183 Malignant neoplasm of hepatic flexure: Secondary | ICD-10-CM

## 2018-11-22 DIAGNOSIS — E119 Type 2 diabetes mellitus without complications: Secondary | ICD-10-CM | POA: Diagnosis not present

## 2018-11-22 DIAGNOSIS — Z7189 Other specified counseling: Secondary | ICD-10-CM

## 2018-11-22 DIAGNOSIS — C186 Malignant neoplasm of descending colon: Secondary | ICD-10-CM

## 2018-11-22 DIAGNOSIS — R630 Anorexia: Secondary | ICD-10-CM | POA: Diagnosis not present

## 2018-11-22 DIAGNOSIS — I10 Essential (primary) hypertension: Secondary | ICD-10-CM | POA: Diagnosis not present

## 2018-11-22 DIAGNOSIS — R5383 Other fatigue: Secondary | ICD-10-CM | POA: Diagnosis not present

## 2018-11-22 LAB — IRON AND TIBC
Iron: 58 ug/dL (ref 41–142)
Saturation Ratios: 20 % — ABNORMAL LOW (ref 21–57)
TIBC: 284 ug/dL (ref 236–444)
UIBC: 226 ug/dL (ref 120–384)

## 2018-11-22 LAB — CBC WITH DIFFERENTIAL (CANCER CENTER ONLY)
Abs Immature Granulocytes: 0.03 10*3/uL (ref 0.00–0.07)
Basophils Absolute: 0 10*3/uL (ref 0.0–0.1)
Basophils Relative: 0 %
Eosinophils Absolute: 0.1 10*3/uL (ref 0.0–0.5)
Eosinophils Relative: 2 %
HCT: 32.5 % — ABNORMAL LOW (ref 36.0–46.0)
Hemoglobin: 9.7 g/dL — ABNORMAL LOW (ref 12.0–15.0)
IMMATURE GRANULOCYTES: 0 %
LYMPHS PCT: 7 %
Lymphs Abs: 0.6 10*3/uL — ABNORMAL LOW (ref 0.7–4.0)
MCH: 29.8 pg (ref 26.0–34.0)
MCHC: 29.8 g/dL — ABNORMAL LOW (ref 30.0–36.0)
MCV: 99.7 fL (ref 80.0–100.0)
Monocytes Absolute: 0.5 10*3/uL (ref 0.1–1.0)
Monocytes Relative: 7 %
Neutro Abs: 6.9 10*3/uL (ref 1.7–7.7)
Neutrophils Relative %: 84 %
Platelet Count: 245 10*3/uL (ref 150–400)
RBC: 3.26 MIL/uL — ABNORMAL LOW (ref 3.87–5.11)
RDW: 14.2 % (ref 11.5–15.5)
WBC Count: 8.2 10*3/uL (ref 4.0–10.5)
nRBC: 0 % (ref 0.0–0.2)

## 2018-11-22 LAB — FERRITIN: Ferritin: 161 ng/mL (ref 11–307)

## 2018-11-22 MED ORDER — FAMOTIDINE IN NACL 20-0.9 MG/50ML-% IV SOLN
20.0000 mg | Freq: Once | INTRAVENOUS | Status: AC
  Administered 2018-11-22: 20 mg via INTRAVENOUS

## 2018-11-22 MED ORDER — LIDOCAINE-PRILOCAINE 2.5-2.5 % EX CREA: TOPICAL_CREAM | CUTANEOUS | 3 refills | Status: DC

## 2018-11-22 MED ORDER — DIPHENHYDRAMINE HCL 25 MG PO CAPS
ORAL_CAPSULE | ORAL | Status: AC
  Filled 2018-11-22: qty 2

## 2018-11-22 MED ORDER — ACETAMINOPHEN 325 MG PO TABS
650.0000 mg | ORAL_TABLET | Freq: Once | ORAL | Status: AC
  Administered 2018-11-22: 650 mg via ORAL

## 2018-11-22 MED ORDER — HEPARIN SOD (PORK) LOCK FLUSH 100 UNIT/ML IV SOLN
500.0000 [IU] | INTRAVENOUS | Status: AC | PRN
  Administered 2018-11-22: 500 [IU]
  Filled 2018-11-22: qty 5

## 2018-11-22 MED ORDER — SODIUM CHLORIDE 0.9 % IV SOLN
200.0000 mg | Freq: Once | INTRAVENOUS | Status: AC
  Administered 2018-11-22: 200 mg via INTRAVENOUS
  Filled 2018-11-22: qty 10

## 2018-11-22 MED ORDER — SODIUM CHLORIDE 0.9% FLUSH
10.0000 mL | INTRAVENOUS | Status: AC | PRN
  Administered 2018-11-22: 10 mL
  Filled 2018-11-22: qty 10

## 2018-11-22 MED ORDER — FAMOTIDINE IN NACL 20-0.9 MG/50ML-% IV SOLN
INTRAVENOUS | Status: AC
  Filled 2018-11-22: qty 50

## 2018-11-22 MED ORDER — DIPHENHYDRAMINE HCL 25 MG PO CAPS
50.0000 mg | ORAL_CAPSULE | Freq: Once | ORAL | Status: AC
  Administered 2018-11-22: 50 mg via ORAL

## 2018-11-22 MED ORDER — METHYLPREDNISOLONE SODIUM SUCC 125 MG IJ SOLR
INTRAMUSCULAR | Status: AC
  Filled 2018-11-22: qty 2

## 2018-11-22 MED ORDER — METHYLPREDNISOLONE SODIUM SUCC 125 MG IJ SOLR
125.0000 mg | Freq: Once | INTRAMUSCULAR | Status: AC
  Administered 2018-11-22: 125 mg via INTRAVENOUS

## 2018-11-22 MED ORDER — ACETAMINOPHEN 325 MG PO TABS
ORAL_TABLET | ORAL | Status: AC
  Filled 2018-11-22: qty 2

## 2018-11-22 MED ORDER — SODIUM CHLORIDE 0.9 % IV SOLN
INTRAVENOUS | Status: DC
  Administered 2018-11-22: 13:00:00 via INTRAVENOUS
  Filled 2018-11-22: qty 250

## 2018-11-22 MED ORDER — PROCHLORPERAZINE MALEATE 10 MG PO TABS: 10.0000 mg | ORAL_TABLET | Freq: Four times a day (QID) | ORAL | 1 refills | Status: DC | PRN

## 2018-11-22 MED ORDER — ONDANSETRON HCL 8 MG PO TABS: 8.0000 mg | ORAL_TABLET | Freq: Two times a day (BID) | ORAL | 1 refills | Status: DC | PRN

## 2018-11-22 NOTE — Progress Notes (Signed)
START ON PATHWAY REGIMEN - Colorectal     A cycle is every 14 days:     Oxaliplatin      Leucovorin      5-Fluorouracil      5-Fluorouracil      Bevacizumab-xxxx   **Always confirm dose/schedule in your pharmacy ordering system**  Patient Characteristics: Distant Metastases, First Line, Potentially Resectable, KRAS Mutation Positive/Unknown, BRAF Wild-Type/Unknown, Bevacizumab Eligible Therapeutic Status: Distant Metastases BRAF Mutation Status: Awaiting Test Results KRAS/NRAS Mutation Status: Awaiting Test Results Line of Therapy: First Line Bevacizumab Eligibility: Eligible Intent of Therapy: Non-Curative / Palliative Intent, Discussed with Patient

## 2018-11-22 NOTE — Progress Notes (Signed)
[name]   [phone] [phone]   Clinic Follow up Note   Patient Care Team: [name], MD as PCP - General (Family Medicine)  Date of Service:  11/22/2018  CHIEF COMPLAINT: F/u on IDA and newly diagnosed colon cancer  SUMMARY OF ONCOLOGIC HISTORY:   Cancer of left colon ([place])   10/25/2018 Procedure    10/25/2018 Colonoscopy Impression: -Two 4 to 6 mm sessile polyps were found in the sigmoid colon. Resected and retrieved. -A 6 mm sessile polyp was found in the descending colon. Resected and retrieved. -Three 5 to 15 mm sessile polyps were found in the transverse colon. Resection and retrieval were complete. -A 12 mm sessile polyp was found in the cecum. Resection and retrieval were complete.  -A 20 mm sessile polyp was found in the ascending colon. Resection and retrieval were complete. . -A malignancy partially obstructing large mass was found at 55 cm proximal to the anus. Biopsied and tattooed. -Malignant partially obstructing tumor 20 cm proximal to the anus. Biopsied and tattooed. -Diverticulosis in the sigmoid colon, in the descending colon and in the transverse colon.     10/25/2018 Imaging    10/25/2018 Endoscopy Impression: -Normal esophagus -Z-line regular 35 cm from the incisors. -Non-bleeding erosive gastropathy -Erythematous mucosa in the antrum. Biopsied. -Normal examined duodenum. Biopsied.    10/30/2018 Initial Diagnosis    Cancer of left colon ([place])    10/30/2018 Initial Biopsy    Final Diagnosis: 10/30/18 1. Small intestine-Duedenum, Biospy:   Benign 2.Stomach-Antrum, Biospy:   Chronic inactive Gastritis 3. Large intestine-Sigmoid Colon, Polyp:   Tubular adenomas (2) 4. Large interstine-Descending Colon, Polyp:   Tubular adenoma with high grade dysplasia, suspicious for invasion.  5. Large intestine-transverse colon, Polyp:   Tubulovilous Adenomas (3).  6. Large Intestine-Cecum, Polp:   Tubular  adenoma  7. Large Intestine-Ascending Colon, Polyp:  Tubulovilous Adenoma 8. Large intestine, Biopsy, Mass 55cm:  Invasive moderately differentiated adenocarcinoma.  9. Large intestine, biopsy, Mass 20cm:   Tubulovilous Adenoma    11/01/2018 Imaging    CT CAP W contrast 11/01/18  IMPRESSION: CT CHEST: 1. Right paratracheal adenopathy (immediately superior to the azygos vein) which short axis dimension of 1.2 cm. In the present clinical setting it is possible this is related to metastatic involvement. 2. Scattered pulmonary parenchymal changes none of which are highly suspicious for metastatic disease. 3. Abnormal appearance of the thyroid gland with left lower lobe ill-defined mass spanning over 3 cm. Thyroid ultrasound can be performed for further delineation. 4. Cardiomegaly. Coronary artery calcification. 5.  Aortic Atherosclerosis (ICD10-I70.0).  CT ABDOMEN PELVIS: 1. Numerous hepatic lesions suspicious for metastatic disease largest within the caudate lobe measuring up to 3.2 cm. 2. Gallbladder wall thickening. This may be related to increased right heart pressure or liver disease but could not exclude cholecystitis in the proper clinical setting. 3. Numerous splenic lesions which in the present clinical setting is suspicious for combination of metastatic disease and splenic cysts. 4. 3.2 cm left adrenal mass suspicious for metastatic disease. 5. Question sigmoid colon and possibly proximal descending colon mass. Rectosigmoid colon mass also not excluded. Correlation with colonoscopy results recommended. 6. Prominent number of colonic diverticula. Third spacing of fluid makes it difficult to evaluate for the possibility of diverticulitis. Radiopaque 1.5 cm structure within the right colon may be related to ingested foreign body. 7. Low-density fatty appearing structures in the external iliac region/pelvic sidewall bilaterally with adjacent low-density iliac lymph nodes  and retroperitoneal lymph  nodes raises possibility of low-density metastatic adenopathy. Prominent size portacaval lymph node which short axis dimension of 1.3 cm. PET-CT could be obtained for further delineation if clinically desired. 8. Gas within the urinary bladder may be related to recent manipulation. Clinical correlation recommended. 9. Left adnexal 2.4 cm cyst. This can be assessed with pelvic sonogram.     Chemotherapy    PENDING FOLFOX every 2 weeks starting 11/28/18     CURRENT THERAPY:  -IV Iron Sucrose as needed.  -PENDING FOLFOX every 2 weeks starting in 2 weeks   INTERVAL HISTORY:  Cassandra Allen is here for a follow up post hospitalization for rectal bleeding. She presents to the clinic today by herself. She notes her liver biopsy went well.   She is clinically stable.  She has mild to moderate fatigue, able to tolerate routine activities.  She has been off work, currently on short-term disability.  She denies significant pain, no melena or much easier.  She denies cough, chest discomfort or other new symptoms.  She notes her son would be her main transportation if she cannot drive herself. She plans to move in with her son after he moves into new home.     REVIEW OF SYSTEMS:   Constitutional: Denies fevers, chills or abnormal weight loss Eyes: Denies blurriness of vision Ears, nose, mouth, throat, and face: Denies mucositis or sore throat Respiratory: Denies cough, dyspnea or wheezes Cardiovascular: Denies palpitation, chest discomfort or lower extremity swelling Gastrointestinal:  Denies nausea, heartburn or change in bowel habits Skin: Denies abnormal skin rashes Lymphatics: Denies new lymphadenopathy or easy bruising Neurological:Denies numbness, tingling or new weaknesses Behavioral/Psych: Mood is stable, no new changes  All other systems were reviewed with the patient and are negative.  MEDICAL HISTORY:  Past Medical History:  Diagnosis Date  .  Arthritis   . Cancer ([place])   . Diabetes mellitus without complication ([place])   . Hypertension     SURGICAL HISTORY: Past Surgical History:  Procedure Laterality Date  . IR IMAGING GUIDED PORT INSERTION  11/20/2018    I have reviewed the social history and family history with the patient and they are unchanged from previous note.  ALLERGIES:  is allergic to metformin and related and prednisone.  MEDICATIONS:  Current Outpatient Medications  Medication Sig Dispense Refill  . apixaban (ELIQUIS) 5 MG TABS tablet TAKE 1 TABLET (5 MG TOTAL) BY MOUTH 2 (TWO) TIMES DAILY. 180 tablet 2  . atorvastatin (LIPITOR) 10 MG tablet Take 1 tablet (10 mg total) by mouth daily at 6 PM. 30 tablet 0  . beta carotene w/minerals (OCUVITE) tablet Take 1 tablet by mouth daily at 12 noon.    . cholecalciferol (VITAMIN D) 1000 units tablet Take 1,000 Units by mouth at bedtime.    Marland Kitchen diltiazem (CARDIZEM CD) 120 MG 24 hr capsule Take 1 capsule (120 mg total) by mouth 2 (two) times daily. 180 capsule 3  . diphenoxylate-atropine (LOMOTIL) 2.5-0.025 MG tablet Take 1 to 2 tablets 4 times daily as needed 40 tablet 3  . ferrous sulfate 325 (65 FE) MG EC tablet Take 325 mg by mouth daily with breakfast.     . HYDROcodone-acetaminophen (NORCO) 7.5-325 MG tablet Take 1 tablet by mouth 2 (two) times daily as needed (for pain.).     . Insulin Detemir (LEVEMIR FLEXTOUCH) 100 UNIT/ML Pen Inject 10 Units into the skin daily.    Marland Kitchen liraglutide (VICTOZA) 18 MG/3ML SOPN Inject 1.8 mg into the skin every morning.    Marland Kitchen  losartan-hydrochlorothiazide (HYZAAR) 100-12.5 MG tablet Take 1 tablet by mouth daily.     . Multiple Vitamin (MULTIVITAMIN WITH MINERALS) TABS tablet Take 2 tablets by mouth daily at 12 noon.     Marland Kitchen oxybutynin (DITROPAN) 5 MG tablet Take 20 mg by mouth daily.    Marland Kitchen oxybutynin (OXYTROL) 3.9 MG/24HR Place 1 patch onto the skin 2 (two) times a week.    . sertraline (ZOLOFT) 100 MG tablet Take 100 mg by mouth daily at 12 noon.      . simethicone (MYLICON) 80 MG chewable tablet Chew 80-160 mg by mouth 2 (two) times daily as needed for flatulence.     . simvastatin (ZOCOR) 20 MG tablet Take 20 mg by mouth daily.     No current facility-administered medications for this visit.    Facility-Administered Medications Ordered in Other Visits  Medication Dose Route Frequency Provider Last Rate Last Dose  . 0.9 %  sodium chloride infusion   Intravenous Continuous Truitt Merle, MD   Stopped at 11/22/18 1528    PHYSICAL EXAMINATION: ECOG PERFORMANCE STATUS: 1 - Symptomatic but completely ambulatory  Vitals:   11/22/18 1200  BP: 134/66  Pulse: 83  Resp: 17  Temp: 98.6 F (37 C)  SpO2: 99%   Filed Weights   11/22/18 1200  Weight: 180 lb (81.6 kg)    GENERAL:alert, no distress and comfortable SKIN: skin color, texture, turgor are normal, no rashes or significant lesions (+) PAC in upper right chest with surrounding skin erythema, clean.  EYES: normal, Conjunctiva are pink and non-injected, sclera clear OROPHARYNX:no exudate, no erythema and lips, buccal mucosa, and tongue normal  NECK: supple, thyroid normal size, non-tender, without nodularity LYMPH:  no palpable lymphadenopathy in the cervical, axillary or inguinal LUNGS: clear to auscultation and percussion with normal breathing effort HEART: regular rate & rhythm and no murmurs and no lower extremity edema ABDOMEN:abdomen soft, non-tender and normal bowel sounds Musculoskeletal:no cyanosis of digits and no clubbing  NEURO: alert & oriented x 3 with fluent speech, no focal motor/sensory deficits  LABORATORY DATA:  I have reviewed the data as listed CBC Latest Ref Rng & Units 11/22/2018 11/20/2018 11/11/2018  WBC 4.0 - 10.5 K/uL 8.2 8.6 13.1(H)  Hemoglobin 12.0 - 15.0 g/dL 9.7(L) 10.6(L) 9.7(L)  Hematocrit 36.0 - 46.0 % 32.5(L) 36.1 34.1(L)  Platelets 150 - 400 K/uL 245 285 262     CMP Latest Ref Rng & Units 11/20/2018 11/11/2018 11/08/2018  Glucose 70 - 99  mg/dL 137(H) 127(H) 97  BUN 8 - 23 mg/dL 31(H) 30(H) 20  Creatinine 0.44 - 1.00 mg/dL 0.95 0.93 0.91  Sodium 135 - 145 mmol/L 142 142 144  Potassium 3.5 - 5.1 mmol/L 4.2 4.6 4.7  Chloride 98 - 111 mmol/L 107 106 108  CO2 22 - 32 mmol/L _0 Calcium 8.9 - 10.3 mg/dL 8.7(L) 8.6(L) 8.9  Total Protein 6.5 - 8.1 g/dL 6.9 6.8 5.5(L)  Total Bilirubin 0.3 - 1.2 mg/dL 0.7 1.3(H) 0.4  Alkaline Phos 38 - 126 U/L 45 42 43  AST 15 - 41 U/L 13(L) 28 9(L)  ALT 0 - 44 U/L _1 RADIOGRAPHIC STUDIES: I have personally reviewed the radiological images as listed and agreed with the findings in the report. No results found.   ASSESSMENT & PLAN:  Cassandra Allen is a 73 y.o. female with   1. 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  She presented with iron deficient anemia -She CT CAP from 11/01/18 showed several hepatic lesions suspicious for metastasis. Her subsequent liver biopsy from 11/20/18 was negative for malignancy.  I spoke with Dr. Annamaria Boots who did a biopsy, her liver lesion was not very clear on the ultrasound, and was in a difficult spot for biopsy.  He recommend abdominal MRI for further evaluation.  I discussed with patient, patient is now willing to go through second liver biopsy, she is willing to to MRI if her co-pay is acceptable. -She shared concern with another copay for biopsy. I will refer her to financial aid office for assistance.  -She has seen by colorectal surgeon Dr. Marcello Moores, due to her metastatic disease, no obstruction, or uncontrollable bleeding, will hold her colon surgery for now. -I discussed treatment options for metastatic colon cancer includes chemo such as FOLFOX or FOLFIRI every 2 weeks, immunotherapy if she has MSI disease, or targeted therapy such as EGFR inhibitor if her tumor is wild type for KRAS/NRAS/BRAF -I will request [facility] genomic testing  on her initial colon biopsy.   -I recommend her to start first line chemo FOLFOX   --Chemotherapy consent: Side effects including but does not limited to, fatigue, nausea, vomiting, diarrhea, hair loss, neuropathy, fluid retention, renal and kidney dysfunction, neutropenic fever, needed for blood transfusion, bleeding, were discussed with patient in great detail. She agrees to proceed. Plan to start in 2 weeks, due to her chemo pre-auth requirement  -PAC placed on 11/20/18, I changed dressing today. I will prescribe her EMLA cream today along with antiemetics.  -Proceed with chemo education class next week  -f/u in 2 weeks   2. Severe anemia, iron deficientanemia and anemia of chronic disease -Currently on IV iron sucrose 224m as needed. Previously had a severe reaction to Feraheme. BM biopsy was negative for MDS or malignancy. -Hg at 9.7 today, iron panel is still pending. (11/22/2018). Will proceed with IV iron today   3. HTN, AF  -Continue medications and f/u with PCP -She is on Eliquis  -Will monitor closely while on chemotherapy. I encouraged her to monitor at home as well.    4. DM, obesity  -Currently on insulin and Lirraglutide.  -f/u with PCP and monitor BG at home -Will monitor closely while on chemotherapy. I encouraged her to monitor at home as well.   5. Social Support  -If she cannot drive herself, her only transportation is her son who works often.  -If she needs help I will refer her SW to aid with transpiration as needed.  -She plans to move in with her son in the near future.  -She is not currently and is out on short term disability.  -I will contact son to discuss her diagnosis and treatment plan.   6. Goal of care discussion  -We again discussed the incurable nature of her cancer, and the overall poor prognosis, especially if she does not have good response to chemotherapy or progress on chemo -The patient understands the goal of care is palliative. -she is  full code now    Plan  -I prescribed EMLA cream and antiemetics  -Chemo class next week -Lab, flush f/u and chemo FOLFOX every 2 weeksX3 starting in 2 weeks  -Liver MRI in 1-2 weeks    No problem-specific Assessment & Plan notes found for this encounter.   No orders of the defined types were placed in this encounter.  All questions were answered.  The patient knows to call the clinic with any problems, questions or concerns. No barriers to learning was detected. I spent 30 minutes counseling the patient face to face. The total time spent in the appointment was 40 minutes and more than 50% was on counseling and review of test results     Truitt Merle, MD 11/22/2018   I, Joslyn Devon, am acting as scribe for Truitt Merle, MD.   I have reviewed the above documentation for accuracy and completeness, and I agree with the above.

## 2018-11-22 NOTE — Patient Instructions (Signed)
Iron Sucrose injection What is this medicine? IRON SUCROSE (AHY ern SOO krohs) is an iron complex. Iron is used to make healthy red blood cells, which carry oxygen and nutrients throughout the body. This medicine is used to treat iron deficiency anemia in people with chronic kidney disease. This medicine may be used for other purposes; ask your health care provider or pharmacist if you have questions. COMMON BRAND NAME(S): Venofer What should I tell my health care provider before I take this medicine? They need to know if you have any of these conditions: -anemia not caused by low iron levels -heart disease -high levels of iron in the blood -kidney disease -liver disease -an unusual or allergic reaction to iron, other medicines, foods, dyes, or preservatives -pregnant or trying to get pregnant -breast-feeding How should I use this medicine? This medicine is for infusion into a vein. It is given by a health care professional in a hospital or clinic setting. Talk to your pediatrician regarding the use of this medicine in children. While this drug may be prescribed for children as young as 2 years for selected conditions, precautions do apply. Overdosage: If you think you have taken too much of this medicine contact a poison control center or emergency room at once. NOTE: This medicine is only for you. Do not share this medicine with others. What if I miss a dose? It is important not to miss your dose. Call your doctor or health care professional if you are unable to keep an appointment. What may interact with this medicine? Do not take this medicine with any of the following medications: -deferoxamine -dimercaprol -other iron products This medicine may also interact with the following medications: -chloramphenicol -deferasirox This list may not describe all possible interactions. Give your health care provider a list of all the medicines, herbs, non-prescription drugs, or dietary  supplements you use. Also tell them if you smoke, drink alcohol, or use illegal drugs. Some items may interact with your medicine. What should I watch for while using this medicine? Visit your doctor or healthcare professional regularly. Tell your doctor or healthcare professional if your symptoms do not start to get better or if they get worse. You may need blood work done while you are taking this medicine. You may need to follow a special diet. Talk to your doctor. Foods that contain iron include: whole grains/cereals, dried fruits, beans, or peas, leafy green vegetables, and organ meats (liver, kidney). What side effects may I notice from receiving this medicine? Side effects that you should report to your doctor or health care professional as soon as possible: -allergic reactions like skin rash, itching or hives, swelling of the face, lips, or tongue -breathing problems -changes in blood pressure -cough -fast, irregular heartbeat -feeling faint or lightheaded, falls -fever or chills -flushing, sweating, or hot feelings -joint or muscle aches/pains -seizures -swelling of the ankles or feet -unusually weak or tired Side effects that usually do not require medical attention (report to your doctor or health care professional if they continue or are bothersome): -diarrhea -feeling achy -headache -irritation at site where injected -nausea, vomiting -stomach upset -tiredness This list may not describe all possible side effects. Call your doctor for medical advice about side effects. You may report side effects to FDA at 1-800-FDA-1088. Where should I keep my medicine? This drug is given in a hospital or clinic and will not be stored at home. NOTE: This sheet is a summary. It may not cover all possible information. If   you have questions about this medicine, talk to your doctor, pharmacist, or health care provider.  2019 Elsevier/Gold Standard (2011-08-10 17:14:35)  

## 2018-11-25 ENCOUNTER — Other Ambulatory Visit: Payer: Self-pay | Admitting: Hematology

## 2018-11-25 ENCOUNTER — Encounter: Payer: Self-pay | Admitting: Hematology

## 2018-11-25 DIAGNOSIS — C186 Malignant neoplasm of descending colon: Secondary | ICD-10-CM

## 2018-11-25 NOTE — Progress Notes (Signed)
Calledpt to introduce myself as her Arboriculturist and to discuss copay assistance. Pt gave me consent to apply in her behalf so I applied to the[employer] and she was approvedfor $2,700 from 08/27/18 to 11/25/19.  I informed her of the [place] and went over what it covers.  She would like to apply so she will bring proof of income on 11/29/18.  If approved I will give her an expense sheet and my card for any questions or concerns she may have in the future.

## 2018-11-26 ENCOUNTER — Other Ambulatory Visit: Payer: Self-pay | Admitting: Hematology

## 2018-11-26 DIAGNOSIS — K769 Liver disease, unspecified: Secondary | ICD-10-CM

## 2018-11-27 ENCOUNTER — Telehealth: Payer: Self-pay | Admitting: Hematology

## 2018-11-27 ENCOUNTER — Other Ambulatory Visit: Payer: Self-pay

## 2018-11-27 ENCOUNTER — Telehealth: Payer: Self-pay

## 2018-11-27 DIAGNOSIS — C186 Malignant neoplasm of descending colon: Secondary | ICD-10-CM

## 2018-11-27 MED ORDER — LIDOCAINE-PRILOCAINE 2.5-2.5 % EX CREA: 1.0000 "application " | TOPICAL_CREAM | CUTANEOUS | 2 refills | Status: DC | PRN

## 2018-11-27 NOTE — Telephone Encounter (Signed)
Called to confirm patient was aware of apt for this week and next week - pt is aware .

## 2018-11-27 NOTE — Progress Notes (Signed)
[place]   [phone] [phone]   Clinic Follow up Note   Patient Care Team: [name], MD as PCP - General (Family Medicine)  Date of Service:  11/29/2018  CHIEF COMPLAINT: F/u on IDAand newly diagnosed colon cancer  SUMMARY OF ONCOLOGIC HISTORY:   Cancer of left colon ([place])   10/25/2018 Procedure    10/25/2018 Colonoscopy Impression: -Two 4 to 6 mm sessile polyps were found in the sigmoid colon. Resected and retrieved. -A 6 mm sessile polyp was found in the descending colon. Resected and retrieved. -Three 5 to 15 mm sessile polyps were found in the transverse colon. Resection and retrieval were complete. -A 12 mm sessile polyp was found in the cecum. Resection and retrieval were complete.  -A 20 mm sessile polyp was found in the ascending colon. Resection and retrieval were complete. . -A malignancy partially obstructing large mass was found at 55 cm proximal to the anus. Biopsied and tattooed. -Malignant partially obstructing tumor 20 cm proximal to the anus. Biopsied and tattooed. -Diverticulosis in the sigmoid colon, in the descending colon and in the transverse colon.     10/25/2018 Imaging    10/25/2018 Endoscopy Impression: -Normal esophagus -Z-line regular 35 cm from the incisors. -Non-bleeding erosive gastropathy -Erythematous mucosa in the antrum. Biopsied. -Normal examined duodenum. Biopsied.    10/25/2018 Cancer Staging    Staging form: Colon and Rectum, AJCC 8th Edition - Clinical stage from 10/25/2018: Stage Unknown (cTX, cN0, cM1) - Signed by Truitt Merle, MD on 11/22/2018    10/30/2018 Initial Diagnosis    Cancer of left colon ([place])    10/30/2018 Initial Biopsy    Final Diagnosis: 10/30/18 1. Small intestine-Duedenum, Biospy:   Benign 2.Stomach-Antrum, Biospy:   Chronic inactive Gastritis 3. Large intestine-Sigmoid Colon, Polyp:   Tubular adenomas (2) 4. Large interstine-Descending Colon, Polyp:   Tubular  adenoma with high grade dysplasia, suspicious for invasion.  5. Large intestine-transverse colon, Polyp:   Tubulovilous Adenomas (3).  6. Large Intestine-Cecum, Polp:   Tubular adenoma  7. Large Intestine-Ascending Colon, Polyp:  Tubulovilous Adenoma 8. Large intestine, Biopsy, Mass 55cm:  Invasive moderately differentiated adenocarcinoma.  9. Large intestine, biopsy, Mass 20cm:   Tubulovilous Adenoma    11/01/2018 Imaging    CT CAP W contrast 11/01/18  IMPRESSION: CT CHEST: 1. Right paratracheal adenopathy (immediately superior to the azygos vein) which short axis dimension of 1.2 cm. In the present clinical setting it is possible this is related to metastatic involvement. 2. Scattered pulmonary parenchymal changes none of which are highly suspicious for metastatic disease. 3. Abnormal appearance of the thyroid gland with left lower lobe ill-defined mass spanning over 3 cm. Thyroid ultrasound can be performed for further delineation. 4. Cardiomegaly. Coronary artery calcification. 5.  Aortic Atherosclerosis (ICD10-I70.0).  CT ABDOMEN PELVIS: 1. Numerous hepatic lesions suspicious for metastatic disease largest within the caudate lobe measuring up to 3.2 cm. 2. Gallbladder wall thickening. This may be related to increased right heart pressure or liver disease but could not exclude cholecystitis in the proper clinical setting. 3. Numerous splenic lesions which in the present clinical setting is suspicious for combination of metastatic disease and splenic cysts. 4. 3.2 cm left adrenal mass suspicious for metastatic disease. 5. Question sigmoid colon and possibly proximal descending colon mass. Rectosigmoid colon mass also not excluded. Correlation with colonoscopy results recommended. 6. Prominent number of colonic diverticula. Third spacing of fluid makes it difficult to evaluate for the possibility of diverticulitis. Radiopaque 1.5 cm  structure within the right colon may be  related to ingested foreign body. 7. Low-density fatty appearing structures in the external iliac region/pelvic sidewall bilaterally with adjacent low-density iliac lymph nodes and retroperitoneal lymph nodes raises possibility of low-density metastatic adenopathy. Prominent size portacaval lymph node which short axis dimension of 1.3 cm. PET-CT could be obtained for further delineation if clinically desired. 8. Gas within the urinary bladder may be related to recent manipulation. Clinical correlation recommended. 9. Left adnexal 2.4 cm cyst. This can be assessed with pelvic sonogram.     Chemotherapy    PENDING FOLFOX every 2 weeks starting 11/29/18      CURRENT THERAPY:  -IVIronSucrose as needed.  -FOLFOX every 2 weeks starting on 11/24/18   INTERVAL HISTORY:  [name] is here for a follow up and first cycle FOLFOX. She presents to the clinic today with her son [name]. She notes at a store she fall with her cane on an incline. She is interested in seeing  SW for more grant money, but not available to see them today.      REVIEW OF SYSTEMS:   Constitutional: Denies fevers, chills or abnormal weight loss Eyes: Denies blurriness of vision Ears, nose, mouth, throat, and face: Denies mucositis or sore throat Respiratory: Denies cough, dyspnea or wheezes Cardiovascular: Denies palpitation, chest discomfort or lower extremity swelling Gastrointestinal:  Denies nausea, heartburn or change in bowel habits Skin: Denies abnormal skin rashes Lymphatics: Denies new lymphadenopathy or easy bruising Neurological:Denies numbness, tingling or new weaknesses Behavioral/Psych: Mood is stable, no new changes  All other systems were reviewed with the patient and are negative.  MEDICAL HISTORY:  Past Medical History:  Diagnosis Date  . Arthritis   . Cancer ([place])   . Diabetes mellitus without complication ([place])   . Hypertension     SURGICAL HISTORY: Past Surgical History:    Procedure Laterality Date  . IR IMAGING GUIDED PORT INSERTION  11/20/2018    I have reviewed the social history and family history with the patient and they are unchanged from previous note.  ALLERGIES:  is allergic to metformin and related and prednisone.  MEDICATIONS:  Current Outpatient Medications  Medication Sig Dispense Refill  . apixaban (ELIQUIS) 5 MG TABS tablet TAKE 1 TABLET (5 MG TOTAL) BY MOUTH 2 (TWO) TIMES DAILY. 180 tablet 2  . atorvastatin (LIPITOR) 10 MG tablet Take 1 tablet (10 mg total) by mouth daily at 6 PM. 30 tablet 0  . beta carotene w/minerals (OCUVITE) tablet Take 1 tablet by mouth daily at 12 noon.    . cholecalciferol (VITAMIN D) 1000 units tablet Take 1,000 Units by mouth at bedtime.    Marland Kitchen diltiazem (CARDIZEM CD) 120 MG 24 hr capsule Take 1 capsule (120 mg total) by mouth 2 (two) times daily. 180 capsule 3  . diphenoxylate-atropine (LOMOTIL) 2.5-0.025 MG tablet Take 1 to 2 tablets 4 times daily as needed 40 tablet 3  . ferrous sulfate 325 (65 FE) MG EC tablet Take 325 mg by mouth daily with breakfast.     . HYDROcodone-acetaminophen (NORCO) 7.5-325 MG tablet Take 1 tablet by mouth 2 (two) times daily as needed (for pain.).     . Insulin Detemir (LEVEMIR FLEXTOUCH) 100 UNIT/ML Pen Inject 10 Units into the skin daily.    Marland Kitchen lidocaine-prilocaine (EMLA) cream Apply to affected area once 30 g 3  . lidocaine-prilocaine (EMLA) cream Apply 1 application topically as needed. 30 g 2  . liraglutide (VICTOZA) 18 MG/3ML SOPN Inject  1.8 mg into the skin every morning.    Marland Kitchen losartan-hydrochlorothiazide (HYZAAR) 100-12.5 MG tablet Take 1 tablet by mouth daily.     . Multiple Vitamin (MULTIVITAMIN WITH MINERALS) TABS tablet Take 2 tablets by mouth daily at 12 noon.     . ondansetron (ZOFRAN) 8 MG tablet Take 1 tablet (8 mg total) by mouth 2 (two) times daily as needed for refractory nausea / vomiting. Start on day 3 after chemotherapy. 30 tablet 1  . oxybutynin (DITROPAN) 5 MG  tablet Take 20 mg by mouth daily.    Marland Kitchen oxybutynin (OXYTROL) 3.9 MG/24HR Place 1 patch onto the skin 2 (two) times a week.    . prochlorperazine (COMPAZINE) 10 MG tablet Take 1 tablet (10 mg total) by mouth every 6 (six) hours as needed (Nausea or vomiting). 30 tablet 1  . sertraline (ZOLOFT) 100 MG tablet Take 100 mg by mouth daily at 12 noon.     . simethicone (MYLICON) 80 MG chewable tablet Chew 80-160 mg by mouth 2 (two) times daily as needed for flatulence.     . simvastatin (ZOCOR) 20 MG tablet Take 20 mg by mouth daily.     No current facility-administered medications for this visit.     PHYSICAL EXAMINATION: ECOG PERFORMANCE STATUS: 2 - Symptomatic, <50% confined to bed  Vitals:   11/29/18 0928  BP: 130/76  Pulse: 82  Resp: 17  Temp: 99.1 F (37.3 C)  SpO2: 99%   Filed Weights   11/29/18 0928  Weight: 184 lb 4.8 oz (83.6 kg)    GENERAL:alert, no distress and comfortable SKIN: skin color, texture, turgor are normal, no rashes or significant lesions EYES: normal, Conjunctiva are pink and non-injected, sclera clear OROPHARYNX:no exudate, no erythema and lips, buccal mucosa, and tongue normal  NECK: supple, thyroid normal size, non-tender, without nodularity LYMPH:  no palpable lymphadenopathy in the cervical, axillary or inguinal LUNGS: clear to auscultation and percussion with normal breathing effort HEART: regular rate & rhythm and no murmurs and no lower extremity edema ABDOMEN:abdomen soft, non-tender and normal bowel sounds Musculoskeletal:no cyanosis of digits and no clubbing  NEURO: alert & oriented x 3 with fluent speech, no focal motor/sensory deficits  LABORATORY DATA:  I have reviewed the data as listed CBC Latest Ref Rng & Units 11/22/2018 11/20/2018 11/11/2018  WBC 4.0 - 10.5 K/uL 8.2 8.6 13.1(H)  Hemoglobin 12.0 - 15.0 g/dL 9.7(L) 10.6(L) 9.7(L)  Hematocrit 36.0 - 46.0 % 32.5(L) 36.1 34.1(L)  Platelets 150 - 400 K/uL 245 285 262     CMP Latest Ref Rng &  Units 11/20/2018 11/11/2018 11/08/2018  Glucose 70 - 99 mg/dL 137(H) 127(H) 97  BUN 8 - 23 mg/dL 31(H) 30(H) 20  Creatinine 0.44 - 1.00 mg/dL 0.95 0.93 0.91  Sodium 135 - 145 mmol/L 142 142 144  Potassium 3.5 - 5.1 mmol/L 4.2 4.6 4.7  Chloride 98 - 111 mmol/L 107 106 108  CO2 22 - 32 mmol/L 26 26 30   Calcium 8.9 - 10.3 mg/dL 8.7(L) 8.6(L) 8.9  Total Protein 6.5 - 8.1 g/dL 6.9 6.8 5.5(L)  Total Bilirubin 0.3 - 1.2 mg/dL 0.7 1.3(H) 0.4  Alkaline Phos 38 - 126 U/L 45 42 43  AST 15 - 41 U/L 13(L) 28 9(L)  ALT 0 - 44 U/L 11 13 10       RADIOGRAPHIC STUDIES: I have personally reviewed the radiological images as listed and agreed with the findings in the report. No results found.   ASSESSMENT & PLAN:  Cassandra Allen is a 72 y.o. 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  She presented with iron deficient anemia -She CT CAP from 11/01/18 showed several hepatic lesions suspicious for metastasis. Her subsequent liver biopsy from 11/20/18 was negative for malignancy. I spoke with Dr. Annamaria Boots who did the biopsy, her liver lesion was not very clear on the ultrasound, and was in a difficult spot for biopsy.  -She has MRI set for 12/01/18 for further evaluation. We again had a lengthy discussion about 2nd biopsy if her MRI is concerning for metastasis.  -Will proceed with chemo FOLFOX in the meantime, given the high clinical suspicion of metastatic disease.  -Her FO results are still pending but will likely add biological agent Avastin or Panitumumab, depends on the KRAS/NRAS/BRAF mutation status  -F/u on 1/22 before first cycle chemo   2. Severe anemia, iron deficientanemia and anemia of chronic disease -Currently on IV iron sucrose 262m as needed. Previously had a severe reaction to Feraheme. BM biopsy was negative for MDS or malignancy. -Hg at 9.7 on latest labs  (11/22/18) -continue iv iron sucrose every 1-2 weeks, if iron level low    3. HTN, AF -Continue medications and f/u with PCP -She ison Eliquis -Will monitor closely while on chemotherapy. I encouraged her to monitor at home as well.    4. DM, obesity -Currently on insulin and Lirraglutide.  -f/u with PCP and monitor BG at home -Will monitor closely while on chemotherapy. I encouraged her to monitor at home as well.   5. Social Support  -If she cannot drive herself, her only transportation is her son who works often.  -If she needs help I will refer her SW to aid with transportation as needed.  -She plans to move in with her son in the near future.  -She is not currently working and is out on short term disability.    6. Goal of care discussion  -We again discussed the incurable nature of her cancer, and the overall poor prognosis, especially if she does not have good response to chemotherapy or progress on chemo -The patient understands the goal of care is palliative. -she is full code now    Plan -she will proceed with abdomen MRI this weekend  -lab, f/u and first cycle chemo FOLFOX next week    No problem-specific Assessment & Plan notes found for this encounter.   No orders of the defined types were placed in this encounter.  All questions were answered. The patient knows to call the clinic with any problems, questions or concerns. No barriers to learning was detected. I spent 25 minutes counseling the patient face to face. The total time spent in the appointment was 30 minutes and more than 50% was on counseling and review of test results     YTruitt Merle MD 11/29/2018   I, AJoslyn Devon am acting as scribe for YTruitt Merle MD.   I have reviewed the above documentation for accuracy and completeness, and I agree with the above.

## 2018-11-27 NOTE — Telephone Encounter (Signed)
Patient requested appointment so her son [name] can meet with Dr. Burr Medico and her to discuss the plan of care, scheduled for Friday 1/17 at 9:15.

## 2018-11-29 ENCOUNTER — Encounter: Payer: Self-pay | Admitting: Hematology

## 2018-11-29 ENCOUNTER — Inpatient Hospital Stay (HOSPITAL_BASED_OUTPATIENT_CLINIC_OR_DEPARTMENT_OTHER): Payer: Medicare HMO | Admitting: Hematology

## 2018-11-29 ENCOUNTER — Inpatient Hospital Stay: Payer: Medicare HMO

## 2018-11-29 VITALS — BP 130/76 | HR 82 | Temp 99.1°F | Resp 17 | Ht 63.0 in | Wt 184.3 lb

## 2018-11-29 VITALS — BP 104/74 | HR 95 | Resp 14

## 2018-11-29 DIAGNOSIS — E669 Obesity, unspecified: Secondary | ICD-10-CM

## 2018-11-29 DIAGNOSIS — C787 Secondary malignant neoplasm of liver and intrahepatic bile duct: Secondary | ICD-10-CM | POA: Diagnosis not present

## 2018-11-29 DIAGNOSIS — Z79899 Other long term (current) drug therapy: Secondary | ICD-10-CM

## 2018-11-29 DIAGNOSIS — D509 Iron deficiency anemia, unspecified: Secondary | ICD-10-CM

## 2018-11-29 DIAGNOSIS — R630 Anorexia: Secondary | ICD-10-CM | POA: Diagnosis not present

## 2018-11-29 DIAGNOSIS — I10 Essential (primary) hypertension: Secondary | ICD-10-CM | POA: Diagnosis not present

## 2018-11-29 DIAGNOSIS — C183 Malignant neoplasm of hepatic flexure: Secondary | ICD-10-CM | POA: Diagnosis not present

## 2018-11-29 DIAGNOSIS — D638 Anemia in other chronic diseases classified elsewhere: Secondary | ICD-10-CM

## 2018-11-29 DIAGNOSIS — E119 Type 2 diabetes mellitus without complications: Secondary | ICD-10-CM

## 2018-11-29 DIAGNOSIS — Z7901 Long term (current) use of anticoagulants: Secondary | ICD-10-CM

## 2018-11-29 DIAGNOSIS — Z794 Long term (current) use of insulin: Secondary | ICD-10-CM

## 2018-11-29 DIAGNOSIS — I4891 Unspecified atrial fibrillation: Secondary | ICD-10-CM | POA: Diagnosis not present

## 2018-11-29 DIAGNOSIS — D500 Iron deficiency anemia secondary to blood loss (chronic): Secondary | ICD-10-CM | POA: Diagnosis not present

## 2018-11-29 DIAGNOSIS — C186 Malignant neoplasm of descending colon: Secondary | ICD-10-CM

## 2018-11-29 DIAGNOSIS — R5383 Other fatigue: Secondary | ICD-10-CM | POA: Diagnosis not present

## 2018-11-29 DIAGNOSIS — D649 Anemia, unspecified: Secondary | ICD-10-CM

## 2018-11-29 MED ORDER — DIPHENHYDRAMINE HCL 25 MG PO CAPS
50.0000 mg | ORAL_CAPSULE | Freq: Once | ORAL | Status: AC
  Administered 2018-11-29: 50 mg via ORAL

## 2018-11-29 MED ORDER — DIPHENHYDRAMINE HCL 25 MG PO CAPS
ORAL_CAPSULE | ORAL | Status: AC
  Filled 2018-11-29: qty 2

## 2018-11-29 MED ORDER — FAMOTIDINE IN NACL 20-0.9 MG/50ML-% IV SOLN
20.0000 mg | Freq: Once | INTRAVENOUS | Status: AC
  Administered 2018-11-29: 20 mg via INTRAVENOUS

## 2018-11-29 MED ORDER — SODIUM CHLORIDE 0.9 % IV SOLN: 200.0000 mg | Freq: Once | INTRAVENOUS | Status: DC

## 2018-11-29 MED ORDER — ACETAMINOPHEN 325 MG PO TABS
650.0000 mg | ORAL_TABLET | Freq: Once | ORAL | Status: AC
  Administered 2018-11-29: 650 mg via ORAL

## 2018-11-29 MED ORDER — METHYLPREDNISOLONE SODIUM SUCC 125 MG IJ SOLR
125.0000 mg | Freq: Once | INTRAMUSCULAR | Status: AC
  Administered 2018-11-29: 125 mg via INTRAVENOUS

## 2018-11-29 MED ORDER — FAMOTIDINE IN NACL 20-0.9 MG/50ML-% IV SOLN
INTRAVENOUS | Status: AC
  Filled 2018-11-29: qty 50

## 2018-11-29 MED ORDER — METHYLPREDNISOLONE SODIUM SUCC 125 MG IJ SOLR
INTRAMUSCULAR | Status: AC
  Filled 2018-11-29: qty 2

## 2018-11-29 MED ORDER — ACETAMINOPHEN 325 MG PO TABS
ORAL_TABLET | ORAL | Status: AC
  Filled 2018-11-29: qty 2

## 2018-11-29 MED ORDER — SODIUM CHLORIDE 0.9 % IV SOLN
200.0000 mg | Freq: Once | INTRAVENOUS | Status: AC
  Administered 2018-11-29: 200 mg via INTRAVENOUS
  Filled 2018-11-29: qty 10

## 2018-11-29 NOTE — Progress Notes (Signed)
Pt is approved for the $700 [facility] grant.  °

## 2018-11-29 NOTE — Patient Instructions (Signed)
Iron Sucrose injection What is this medicine? IRON SUCROSE (AHY ern SOO krohs) is an iron complex. Iron is used to make healthy red blood cells, which carry oxygen and nutrients throughout the body. This medicine is used to treat iron deficiency anemia in people with chronic kidney disease. This medicine may be used for other purposes; ask your health care provider or pharmacist if you have questions. COMMON BRAND NAME(S): Venofer What should I tell my health care provider before I take this medicine? They need to know if you have any of these conditions: -anemia not caused by low iron levels -heart disease -high levels of iron in the blood -kidney disease -liver disease -an unusual or allergic reaction to iron, other medicines, foods, dyes, or preservatives -pregnant or trying to get pregnant -breast-feeding How should I use this medicine? This medicine is for infusion into a vein. It is given by a health care professional in a hospital or clinic setting. Talk to your pediatrician regarding the use of this medicine in children. While this drug may be prescribed for children as young as 2 years for selected conditions, precautions do apply. Overdosage: If you think you have taken too much of this medicine contact a poison control center or emergency room at once. NOTE: This medicine is only for you. Do not share this medicine with others. What if I miss a dose? It is important not to miss your dose. Call your doctor or health care professional if you are unable to keep an appointment. What may interact with this medicine? Do not take this medicine with any of the following medications: -deferoxamine -dimercaprol -other iron products This medicine may also interact with the following medications: -chloramphenicol -deferasirox This list may not describe all possible interactions. Give your health care provider a list of all the medicines, herbs, non-prescription drugs, or dietary  supplements you use. Also tell them if you smoke, drink alcohol, or use illegal drugs. Some items may interact with your medicine. What should I watch for while using this medicine? Visit your doctor or healthcare professional regularly. Tell your doctor or healthcare professional if your symptoms do not start to get better or if they get worse. You may need blood work done while you are taking this medicine. You may need to follow a special diet. Talk to your doctor. Foods that contain iron include: whole grains/cereals, dried fruits, beans, or peas, leafy green vegetables, and organ meats (liver, kidney). What side effects may I notice from receiving this medicine? Side effects that you should report to your doctor or health care professional as soon as possible: -allergic reactions like skin rash, itching or hives, swelling of the face, lips, or tongue -breathing problems -changes in blood pressure -cough -fast, irregular heartbeat -feeling faint or lightheaded, falls -fever or chills -flushing, sweating, or hot feelings -joint or muscle aches/pains -seizures -swelling of the ankles or feet -unusually weak or tired Side effects that usually do not require medical attention (report to your doctor or health care professional if they continue or are bothersome): -diarrhea -feeling achy -headache -irritation at site where injected -nausea, vomiting -stomach upset -tiredness This list may not describe all possible side effects. Call your doctor for medical advice about side effects. You may report side effects to FDA at 1-800-FDA-1088. Where should I keep my medicine? This drug is given in a hospital or clinic and will not be stored at home. NOTE: This sheet is a summary. It may not cover all possible information. If   you have questions about this medicine, talk to your doctor, pharmacist, or health care provider.  2019 Elsevier/Gold Standard (2011-08-10 17:14:35)  

## 2018-11-30 ENCOUNTER — Ambulatory Visit (HOSPITAL_COMMUNITY): Payer: Medicare HMO

## 2018-12-01 ENCOUNTER — Inpatient Hospital Stay: Admission: RE | Admit: 2018-12-01 | Payer: Self-pay | Source: Ambulatory Visit

## 2018-12-02 ENCOUNTER — Telehealth: Payer: Self-pay | Admitting: Hematology

## 2018-12-02 NOTE — Telephone Encounter (Signed)
No los per 01/17.

## 2018-12-04 ENCOUNTER — Inpatient Hospital Stay: Payer: Medicare HMO

## 2018-12-04 ENCOUNTER — Ambulatory Visit (HOSPITAL_COMMUNITY): Payer: Medicare HMO

## 2018-12-04 ENCOUNTER — Inpatient Hospital Stay: Payer: Medicare HMO | Admitting: Hematology

## 2018-12-04 ENCOUNTER — Other Ambulatory Visit: Payer: Self-pay

## 2018-12-04 ENCOUNTER — Other Ambulatory Visit: Payer: Self-pay | Admitting: Hematology

## 2018-12-04 VITALS — BP 143/58 | HR 90 | Temp 97.7°F | Resp 18 | Wt 180.0 lb

## 2018-12-04 DIAGNOSIS — C787 Secondary malignant neoplasm of liver and intrahepatic bile duct: Secondary | ICD-10-CM | POA: Diagnosis not present

## 2018-12-04 DIAGNOSIS — D509 Iron deficiency anemia, unspecified: Secondary | ICD-10-CM

## 2018-12-04 DIAGNOSIS — E119 Type 2 diabetes mellitus without complications: Secondary | ICD-10-CM | POA: Diagnosis not present

## 2018-12-04 DIAGNOSIS — I10 Essential (primary) hypertension: Secondary | ICD-10-CM | POA: Diagnosis not present

## 2018-12-04 DIAGNOSIS — D638 Anemia in other chronic diseases classified elsewhere: Secondary | ICD-10-CM | POA: Diagnosis not present

## 2018-12-04 DIAGNOSIS — Z95828 Presence of other vascular implants and grafts: Secondary | ICD-10-CM

## 2018-12-04 DIAGNOSIS — R5383 Other fatigue: Secondary | ICD-10-CM | POA: Diagnosis not present

## 2018-12-04 DIAGNOSIS — R630 Anorexia: Secondary | ICD-10-CM | POA: Diagnosis not present

## 2018-12-04 DIAGNOSIS — I4891 Unspecified atrial fibrillation: Secondary | ICD-10-CM | POA: Diagnosis not present

## 2018-12-04 DIAGNOSIS — C186 Malignant neoplasm of descending colon: Secondary | ICD-10-CM | POA: Diagnosis not present

## 2018-12-04 DIAGNOSIS — C183 Malignant neoplasm of hepatic flexure: Secondary | ICD-10-CM | POA: Diagnosis not present

## 2018-12-04 DIAGNOSIS — D500 Iron deficiency anemia secondary to blood loss (chronic): Secondary | ICD-10-CM | POA: Diagnosis not present

## 2018-12-04 LAB — CMP (CANCER CENTER ONLY)
ALT: 8 U/L (ref 0–44)
AST: 8 U/L — AB (ref 15–41)
Albumin: 3.1 g/dL — ABNORMAL LOW (ref 3.5–5.0)
Alkaline Phosphatase: 42 U/L (ref 38–126)
Anion gap: 8 (ref 5–15)
BUN: 31 mg/dL — ABNORMAL HIGH (ref 8–23)
CHLORIDE: 113 mmol/L — AB (ref 98–111)
CO2: 23 mmol/L (ref 22–32)
Calcium: 8 mg/dL — ABNORMAL LOW (ref 8.9–10.3)
Creatinine: 1.01 mg/dL — ABNORMAL HIGH (ref 0.44–1.00)
GFR, EST NON AFRICAN AMERICAN: 56 mL/min — AB (ref >60)
GFR, Est AFR Am: >60 mL/min (ref >60)
Glucose, Bld: 182 mg/dL — ABNORMAL HIGH (ref 70–99)
Potassium: 4.1 mmol/L (ref 3.5–5.1)
Sodium: 144 mmol/L (ref 135–145)
Total Bilirubin: 0.3 mg/dL (ref 0.3–1.2)
Total Protein: 5.4 g/dL — ABNORMAL LOW (ref 6.5–8.1)

## 2018-12-04 LAB — CBC WITH DIFFERENTIAL (CANCER CENTER ONLY)
Abs Immature Granulocytes: 0.06 10*3/uL (ref 0.00–0.07)
Basophils Absolute: 0 10*3/uL (ref 0.0–0.1)
Basophils Relative: 0 %
Eosinophils Absolute: 0.1 10*3/uL (ref 0.0–0.5)
Eosinophils Relative: 1 %
HCT: 25 % — ABNORMAL LOW (ref 36.0–46.0)
HEMOGLOBIN: 7.5 g/dL — AB (ref 12.0–15.0)
Immature Granulocytes: 1 %
Lymphocytes Relative: 9 %
Lymphs Abs: 0.7 10*3/uL (ref 0.7–4.0)
MCH: 30.1 pg (ref 26.0–34.0)
MCHC: 30 g/dL (ref 30.0–36.0)
MCV: 100.4 fL — ABNORMAL HIGH (ref 80.0–100.0)
Monocytes Absolute: 0.8 10*3/uL (ref 0.1–1.0)
Monocytes Relative: 10 %
Neutro Abs: 6.2 10*3/uL (ref 1.7–7.7)
Neutrophils Relative %: 79 %
Platelet Count: 189 10*3/uL (ref 150–400)
RBC: 2.49 MIL/uL — AB (ref 3.87–5.11)
RDW: 15 % (ref 11.5–15.5)
WBC Count: 7.8 10*3/uL (ref 4.0–10.5)
nRBC: 0.3 % — ABNORMAL HIGH (ref 0.0–0.2)

## 2018-12-04 LAB — IRON AND TIBC
Iron: 64 ug/dL (ref 41–142)
Saturation Ratios: 25 % (ref 21–57)
TIBC: 259 ug/dL (ref 236–444)
UIBC: 195 ug/dL (ref 120–384)

## 2018-12-04 LAB — FERRITIN: Ferritin: 160 ng/mL (ref 11–307)

## 2018-12-04 LAB — PREPARE RBC (CROSSMATCH)

## 2018-12-04 MED ORDER — PALONOSETRON HCL INJECTION 0.25 MG/5ML
INTRAVENOUS | Status: AC
  Filled 2018-12-04: qty 5

## 2018-12-04 MED ORDER — DEXAMETHASONE SODIUM PHOSPHATE 10 MG/ML IJ SOLN
INTRAMUSCULAR | Status: AC
  Filled 2018-12-04: qty 1

## 2018-12-04 MED ORDER — DEXTROSE 5 % IV SOLN
Freq: Once | INTRAVENOUS | Status: AC
  Administered 2018-12-04: 13:00:00 via INTRAVENOUS
  Filled 2018-12-04: qty 250

## 2018-12-04 MED ORDER — LEUCOVORIN CALCIUM INJECTION 350 MG
400.0000 mg/m2 | Freq: Once | INTRAVENOUS | Status: AC
  Administered 2018-12-04: 760 mg via INTRAVENOUS
  Filled 2018-12-04: qty 38

## 2018-12-04 MED ORDER — PALONOSETRON HCL INJECTION 0.25 MG/5ML
0.2500 mg | Freq: Once | INTRAVENOUS | Status: AC
  Administered 2018-12-04: 0.25 mg via INTRAVENOUS

## 2018-12-04 MED ORDER — SODIUM CHLORIDE 0.9% FLUSH
10.0000 mL | Freq: Once | INTRAVENOUS | Status: AC
  Administered 2018-12-04: 10 mL
  Filled 2018-12-04: qty 10

## 2018-12-04 MED ORDER — DEXAMETHASONE SODIUM PHOSPHATE 10 MG/ML IJ SOLN
10.0000 mg | Freq: Once | INTRAMUSCULAR | Status: AC
  Administered 2018-12-04: 10 mg via INTRAVENOUS

## 2018-12-04 MED ORDER — SODIUM CHLORIDE 0.9 % IV SOLN
2400.0000 mg/m2 | INTRAVENOUS | Status: DC
  Administered 2018-12-04: 4550 mg via INTRAVENOUS
  Filled 2018-12-04: qty 91

## 2018-12-04 MED ORDER — OXALIPLATIN CHEMO INJECTION 100 MG/20ML
79.0000 mg/m2 | Freq: Once | INTRAVENOUS | Status: AC
  Administered 2018-12-04: 150 mg via INTRAVENOUS
  Filled 2018-12-04: qty 10

## 2018-12-04 NOTE — Progress Notes (Signed)
Ordered 2 units of blood, released T&S, prepare, sent scheduling message to add to 1/23 at 8:30

## 2018-12-04 NOTE — Progress Notes (Signed)
Per Dr. Burr Medico, Okay to treat patient with current labs. She also states she would be stopping by to see patient back in infusion.

## 2018-12-04 NOTE — Patient Instructions (Signed)
[facility] Discharge Instructions for Patients Receiving Chemotherapy  Today you received the following chemotherapy agents Oxaliplatin (ELOXATIN), Leucovorin & Flourouracil (ADRUCIL).  To help prevent nausea and vomiting after your treatment, we encourage you to take your nausea medication as prescribed. DO NOT TAKE ZOFRAN FOR THE NEXT 3 DAYS, TAKE COMPAZINE AS PRESCRIBED.  If you develop nausea and vomiting that is not controlled by your nausea medication, call the clinic.   BELOW ARE SYMPTOMS THAT SHOULD BE REPORTED IMMEDIATELY:  *FEVER GREATER THAN 100.5 F  *CHILLS WITH OR WITHOUT FEVER  NAUSEA AND VOMITING THAT IS NOT CONTROLLED WITH YOUR NAUSEA MEDICATION  *UNUSUAL SHORTNESS OF BREATH  *UNUSUAL BRUISING OR BLEEDING  TENDERNESS IN MOUTH AND THROAT WITH OR WITHOUT PRESENCE OF ULCERS  *URINARY PROBLEMS  *BOWEL PROBLEMS  UNUSUAL RASH Items with * indicate a potential emergency and should be followed up as soon as possible.  Feel free to call the clinic should you have any questions or concerns. The clinic phone number is [phone].  Please show the [facility] at check-in to the Emergency Department and triage nurse.   Oxaliplatin Injection What is this medicine? OXALIPLATIN (ox AL i PLA tin) is a chemotherapy drug. It targets fast dividing cells, like cancer cells, and causes these cells to die. This medicine is used to treat cancers of the colon and rectum, and many other cancers. This medicine may be used for other purposes; ask your health care provider or pharmacist if you have questions. COMMON BRAND NAME(S): Eloxatin What should I tell my health care provider before I take this medicine? They need to know if you have any of these conditions: -kidney disease -an unusual or allergic reaction to oxaliplatin, other chemotherapy, other medicines, foods, dyes, or preservatives -pregnant or trying to get pregnant -breast-feeding How should I  use this medicine? This drug is given as an infusion into a vein. It is administered in a hospital or clinic by a specially trained health care professional. Talk to your pediatrician regarding the use of this medicine in children. Special care may be needed. Overdosage: If you think you have taken too much of this medicine contact a poison control center or emergency room at once. NOTE: This medicine is only for you. Do not share this medicine with others. What if I miss a dose? It is important not to miss a dose. Call your doctor or health care professional if you are unable to keep an appointment. What may interact with this medicine? -medicines to increase blood counts like filgrastim, pegfilgrastim, sargramostim -probenecid -some antibiotics like amikacin, gentamicin, neomycin, polymyxin B, streptomycin, tobramycin -zalcitabine Talk to your doctor or health care professional before taking any of these medicines: -acetaminophen -aspirin -ibuprofen -ketoprofen -naproxen This list may not describe all possible interactions. Give your health care provider a list of all the medicines, herbs, non-prescription drugs, or dietary supplements you use. Also tell them if you smoke, drink alcohol, or use illegal drugs. Some items may interact with your medicine. What should I watch for while using this medicine? Your condition will be monitored carefully while you are receiving this medicine. You will need important blood work done while you are taking this medicine. This medicine can make you more sensitive to cold. Do not drink cold drinks or use ice. Cover exposed skin before coming in contact with cold temperatures or cold objects. When out in cold weather wear warm clothing and cover your mouth and nose to warm the air  that goes into your lungs. Tell your doctor if you get sensitive to the cold. This drug may make you feel generally unwell. This is not uncommon, as chemotherapy can affect healthy  cells as well as cancer cells. Report any side effects. Continue your course of treatment even though you feel ill unless your doctor tells you to stop. In some cases, you may be given additional medicines to help with side effects. Follow all directions for their use. Call your doctor or health care professional for advice if you get a fever, chills or sore throat, or other symptoms of a cold or flu. Do not treat yourself. This drug decreases your body's ability to fight infections. Try to avoid being around people who are sick. This medicine may increase your risk to bruise or bleed. Call your doctor or health care professional if you notice any unusual bleeding. Be careful brushing and flossing your teeth or using a toothpick because you may get an infection or bleed more easily. If you have any dental work done, tell your dentist you are receiving this medicine. Avoid taking products that contain aspirin, acetaminophen, ibuprofen, naproxen, or ketoprofen unless instructed by your doctor. These medicines may hide a fever. Do not become pregnant while taking this medicine. Women should inform their doctor if they wish to become pregnant or think they might be pregnant. There is a potential for serious side effects to an unborn child. Talk to your health care professional or pharmacist for more information. Do not breast-feed an infant while taking this medicine. Call your doctor or health care professional if you get diarrhea. Do not treat yourself. What side effects may I notice from receiving this medicine? Side effects that you should report to your doctor or health care professional as soon as possible: -allergic reactions like skin rash, itching or hives, swelling of the face, lips, or tongue -low blood counts - This drug may decrease the number of white blood cells, red blood cells and platelets. You may be at increased risk for infections and bleeding. -signs of infection - fever or chills,  cough, sore throat, pain or difficulty passing urine -signs of decreased platelets or bleeding - bruising, pinpoint red spots on the skin, black, tarry stools, nosebleeds -signs of decreased red blood cells - unusually weak or tired, fainting spells, lightheadedness -breathing problems -chest pain, pressure -cough -diarrhea -jaw tightness -mouth sores -nausea and vomiting -pain, swelling, redness or irritation at the injection site -pain, tingling, numbness in the hands or feet -problems with balance, talking, walking -redness, blistering, peeling or loosening of the skin, including inside the mouth -trouble passing urine or change in the amount of urine Side effects that usually do not require medical attention (report to your doctor or health care professional if they continue or are bothersome): -changes in vision -constipation -hair loss -loss of appetite -metallic taste in the mouth or changes in taste -stomach pain This list may not describe all possible side effects. Call your doctor for medical advice about side effects. You may report side effects to FDA at 1-800-FDA-1088. Where should I keep my medicine? This drug is given in a hospital or clinic and will not be stored at home. NOTE: This sheet is a summary. It may not cover all possible information. If you have questions about this medicine, talk to your doctor, pharmacist, or health care provider.  2019 Elsevier/Gold Standard (2008-05-26 17:22:47)   Leucovorin injection What is this medicine? LEUCOVORIN (loo koe VOR in) is used  to prevent or treat the harmful effects of some medicines. This medicine is used to treat anemia caused by a low amount of folic acid in the body. It is also used with 5-fluorouracil (5-FU) to treat colon cancer. This medicine may be used for other purposes; ask your health care provider or pharmacist if you have questions. What should I tell my health care provider before I take this  medicine? They need to know if you have any of these conditions: -anemia from low levels of vitamin B-12 in the blood -an unusual or allergic reaction to leucovorin, folic acid, other medicines, foods, dyes, or preservatives -pregnant or trying to get pregnant -breast-feeding How should I use this medicine? This medicine is for injection into a muscle or into a vein. It is given by a health care professional in a hospital or clinic setting. Talk to your pediatrician regarding the use of this medicine in children. Special care may be needed. Overdosage: If you think you have taken too much of this medicine contact a poison control center or emergency room at once. NOTE: This medicine is only for you. Do not share this medicine with others. What if I miss a dose? This does not apply. What may interact with this medicine? -capecitabine -fluorouracil -phenobarbital -phenytoin -primidone -trimethoprim-sulfamethoxazole This list may not describe all possible interactions. Give your health care provider a list of all the medicines, herbs, non-prescription drugs, or dietary supplements you use. Also tell them if you smoke, drink alcohol, or use illegal drugs. Some items may interact with your medicine. What should I watch for while using this medicine? Your condition will be monitored carefully while you are receiving this medicine. This medicine may increase the side effects of 5-fluorouracil, 5-FU. Tell your doctor or health care professional if you have diarrhea or mouth sores that do not get better or that get worse. What side effects may I notice from receiving this medicine? Side effects that you should report to your doctor or health care professional as soon as possible: -allergic reactions like skin rash, itching or hives, swelling of the face, lips, or tongue -breathing problems -fever, infection -mouth sores -unusual bleeding or bruising -unusually weak or tired Side effects that  usually do not require medical attention (report to your doctor or health care professional if they continue or are bothersome): -constipation or diarrhea -loss of appetite -nausea, vomiting This list may not describe all possible side effects. Call your doctor for medical advice about side effects. You may report side effects to FDA at 1-800-FDA-1088. Where should I keep my medicine? This drug is given in a hospital or clinic and will not be stored at home. NOTE: This sheet is a summary. It may not cover all possible information. If you have questions about this medicine, talk to your doctor, pharmacist, or health care provider.  2019 Elsevier/Gold Standard (2008-05-05 16:50:29)   Fluorouracil, 5-FU injection What is this medicine? FLUOROURACIL, 5-FU (flure oh YOOR a sil) is a chemotherapy drug. It slows the growth of cancer cells. This medicine is used to treat many types of cancer like breast cancer, colon or rectal cancer, pancreatic cancer, and stomach cancer. This medicine may be used for other purposes; ask your health care provider or pharmacist if you have questions. COMMON BRAND NAME(S): Adrucil What should I tell my health care provider before I take this medicine? They need to know if you have any of these conditions: -blood disorders -dihydropyrimidine dehydrogenase (DPD) deficiency -infection (especially a virus  infection such as chickenpox, cold sores, or herpes) -kidney disease -liver disease -malnourished, poor nutrition -recent or ongoing radiation therapy -an unusual or allergic reaction to fluorouracil, other chemotherapy, other medicines, foods, dyes, or preservatives -pregnant or trying to get pregnant -breast-feeding How should I use this medicine? This drug is given as an infusion or injection into a vein. It is administered in a hospital or clinic by a specially trained health care professional. Talk to your pediatrician regarding the use of this medicine in  children. Special care may be needed. Overdosage: If you think you have taken too much of this medicine contact a poison control center or emergency room at once. NOTE: This medicine is only for you. Do not share this medicine with others. What if I miss a dose? It is important not to miss your dose. Call your doctor or health care professional if you are unable to keep an appointment. What may interact with this medicine? -allopurinol -cimetidine -dapsone -digoxin -hydroxyurea -leucovorin -levamisole -medicines for seizures like ethotoin, fosphenytoin, phenytoin -medicines to increase blood counts like filgrastim, pegfilgrastim, sargramostim -medicines that treat or prevent blood clots like warfarin, enoxaparin, and dalteparin -methotrexate -metronidazole -pyrimethamine -some other chemotherapy drugs like busulfan, cisplatin, estramustine, vinblastine -trimethoprim -trimetrexate -vaccines Talk to your doctor or health care professional before taking any of these medicines: -acetaminophen -aspirin -ibuprofen -ketoprofen -naproxen This list may not describe all possible interactions. Give your health care provider a list of all the medicines, herbs, non-prescription drugs, or dietary supplements you use. Also tell them if you smoke, drink alcohol, or use illegal drugs. Some items may interact with your medicine. What should I watch for while using this medicine? Visit your doctor for checks on your progress. This drug may make you feel generally unwell. This is not uncommon, as chemotherapy can affect healthy cells as well as cancer cells. Report any side effects. Continue your course of treatment even though you feel ill unless your doctor tells you to stop. In some cases, you may be given additional medicines to help with side effects. Follow all directions for their use. Call your doctor or health care professional for advice if you get a fever, chills or sore throat, or other  symptoms of a cold or flu. Do not treat yourself. This drug decreases your body's ability to fight infections. Try to avoid being around people who are sick. This medicine may increase your risk to bruise or bleed. Call your doctor or health care professional if you notice any unusual bleeding. Be careful brushing and flossing your teeth or using a toothpick because you may get an infection or bleed more easily. If you have any dental work done, tell your dentist you are receiving this medicine. Avoid taking products that contain aspirin, acetaminophen, ibuprofen, naproxen, or ketoprofen unless instructed by your doctor. These medicines may hide a fever. Do not become pregnant while taking this medicine. Women should inform their doctor if they wish to become pregnant or think they might be pregnant. There is a potential for serious side effects to an unborn child. Talk to your health care professional or pharmacist for more information. Do not breast-feed an infant while taking this medicine. Men should inform their doctor if they wish to father a child. This medicine may lower sperm counts. Do not treat diarrhea with over the counter products. Contact your doctor if you have diarrhea that lasts more than 2 days or if it is severe and watery. This medicine can make you more  sensitive to the sun. Keep out of the sun. If you cannot avoid being in the sun, wear protective clothing and use sunscreen. Do not use sun lamps or tanning beds/booths. What side effects may I notice from receiving this medicine? Side effects that you should report to your doctor or health care professional as soon as possible: -allergic reactions like skin rash, itching or hives, swelling of the face, lips, or tongue -low blood counts - this medicine may decrease the number of white blood cells, red blood cells and platelets. You may be at increased risk for infections and bleeding. -signs of infection - fever or chills, cough,  sore throat, pain or difficulty passing urine -signs of decreased platelets or bleeding - bruising, pinpoint red spots on the skin, black, tarry stools, blood in the urine -signs of decreased red blood cells - unusually weak or tired, fainting spells, lightheadedness -breathing problems -changes in vision -chest pain -mouth sores -nausea and vomiting -pain, swelling, redness at site where injected -pain, tingling, numbness in the hands or feet -redness, swelling, or sores on hands or feet -stomach pain -unusual bleeding Side effects that usually do not require medical attention (report to your doctor or health care professional if they continue or are bothersome): -changes in finger or toe nails -diarrhea -dry or itchy skin -hair loss -headache -loss of appetite -sensitivity of eyes to the light -stomach upset -unusually teary eyes This list may not describe all possible side effects. Call your doctor for medical advice about side effects. You may report side effects to FDA at 1-800-FDA-1088. Where should I keep my medicine? This drug is given in a hospital or clinic and will not be stored at home. NOTE: This sheet is a summary. It may not cover all possible information. If you have questions about this medicine, talk to your doctor, pharmacist, or health care provider.  2019 Elsevier/Gold Standard (2008-03-04 13:53:16)

## 2018-12-05 ENCOUNTER — Ambulatory Visit (HOSPITAL_COMMUNITY): Payer: Medicare HMO

## 2018-12-05 ENCOUNTER — Inpatient Hospital Stay: Payer: Medicare HMO

## 2018-12-05 DIAGNOSIS — R5383 Other fatigue: Secondary | ICD-10-CM | POA: Diagnosis not present

## 2018-12-05 DIAGNOSIS — E119 Type 2 diabetes mellitus without complications: Secondary | ICD-10-CM | POA: Diagnosis not present

## 2018-12-05 DIAGNOSIS — D509 Iron deficiency anemia, unspecified: Secondary | ICD-10-CM

## 2018-12-05 DIAGNOSIS — R630 Anorexia: Secondary | ICD-10-CM | POA: Diagnosis not present

## 2018-12-05 DIAGNOSIS — D638 Anemia in other chronic diseases classified elsewhere: Secondary | ICD-10-CM | POA: Diagnosis not present

## 2018-12-05 DIAGNOSIS — I4891 Unspecified atrial fibrillation: Secondary | ICD-10-CM | POA: Diagnosis not present

## 2018-12-05 DIAGNOSIS — C787 Secondary malignant neoplasm of liver and intrahepatic bile duct: Secondary | ICD-10-CM | POA: Diagnosis not present

## 2018-12-05 DIAGNOSIS — D500 Iron deficiency anemia secondary to blood loss (chronic): Secondary | ICD-10-CM | POA: Diagnosis not present

## 2018-12-05 DIAGNOSIS — C183 Malignant neoplasm of hepatic flexure: Secondary | ICD-10-CM | POA: Diagnosis not present

## 2018-12-05 DIAGNOSIS — I10 Essential (primary) hypertension: Secondary | ICD-10-CM | POA: Diagnosis not present

## 2018-12-05 MED ORDER — DIPHENHYDRAMINE HCL 25 MG PO CAPS
25.0000 mg | ORAL_CAPSULE | Freq: Once | ORAL | Status: AC
  Administered 2018-12-05: 25 mg via ORAL

## 2018-12-05 MED ORDER — ACETAMINOPHEN 325 MG PO TABS
650.0000 mg | ORAL_TABLET | Freq: Once | ORAL | Status: AC
  Administered 2018-12-05: 650 mg via ORAL

## 2018-12-05 MED ORDER — ACETAMINOPHEN 325 MG PO TABS
ORAL_TABLET | ORAL | Status: AC
  Filled 2018-12-05: qty 2

## 2018-12-05 MED ORDER — DIPHENHYDRAMINE HCL 25 MG PO CAPS
ORAL_CAPSULE | ORAL | Status: AC
  Filled 2018-12-05: qty 1

## 2018-12-05 MED ORDER — SODIUM CHLORIDE 0.9% IV SOLUTION
250.0000 mL | Freq: Once | INTRAVENOUS | Status: AC
  Administered 2018-12-05: 250 mL via INTRAVENOUS
  Filled 2018-12-05: qty 250

## 2018-12-05 NOTE — Patient Instructions (Signed)
Blood Transfusion, Adult, Care After This sheet gives you information about how to care for yourself after your procedure. Your doctor may also give you more specific instructions. If you have problems or questions, contact your doctor. Follow these instructions at home:   Take over-the-counter and prescription medicines only as told by your doctor.  Go back to your normal activities as told by your doctor.  Follow instructions from your doctor about how to take care of the area where an IV tube was put into your vein (insertion site). Make sure you: ? Wash your hands with soap and water before you change your bandage (dressing). If there is no soap and water, use hand sanitizer. ? Change your bandage as told by your doctor.  Check your IV insertion site every day for signs of infection. Check for: ? More redness, swelling, or pain. ? More fluid or blood. ? Warmth. ? Pus or a bad smell. Contact a doctor if:  You have more redness, swelling, or pain around the IV insertion site.  You have more fluid or blood coming from the IV insertion site.  Your IV insertion site feels warm to the touch.  You have pus or a bad smell coming from the IV insertion site.  Your pee (urine) turns pink, red, or brown.  You feel weak after doing your normal activities. Get help right away if:  You have signs of a serious allergic or body defense (immune) system reaction, including: ? Itchiness. ? Hives. ? Trouble breathing. ? Anxiety. ? Pain in your chest or lower back. ? Fever, flushing, and chills. ? Fast pulse. ? Rash. ? Watery poop (diarrhea). ? Throwing up (vomiting). ? Dark pee. ? Serious headache. ? Dizziness. ? Stiff neck. ? Yellow color in your face or the white parts of your eyes (jaundice). Summary  After a blood transfusion, return to your normal activities as told by your doctor.  Every day, check for signs of infection where the IV tube was put into your vein.  Some  signs of infection are warm skin, more redness and pain, more fluid or blood, and pus or a bad smell where the needle went in.  Contact your doctor if you feel weak or have any unusual symptoms. This information is not intended to replace advice given to you by your health care provider. Make sure you discuss any questions you have with your health care provider. Document Released: 11/20/2014 Document Revised: 06/23/2016 Document Reviewed: 06/23/2016 Elsevier Interactive Patient Education  2019 [employer] Inc.  

## 2018-12-06 ENCOUNTER — Telehealth: Payer: Self-pay

## 2018-12-06 ENCOUNTER — Ambulatory Visit: Payer: Self-pay

## 2018-12-06 ENCOUNTER — Other Ambulatory Visit: Payer: Self-pay

## 2018-12-06 ENCOUNTER — Other Ambulatory Visit: Payer: Self-pay | Admitting: Hematology

## 2018-12-06 ENCOUNTER — Inpatient Hospital Stay: Payer: Medicare HMO

## 2018-12-06 VITALS — BP 137/78 | HR 94 | Temp 98.7°F | Resp 18

## 2018-12-06 DIAGNOSIS — D500 Iron deficiency anemia secondary to blood loss (chronic): Secondary | ICD-10-CM | POA: Diagnosis not present

## 2018-12-06 DIAGNOSIS — E119 Type 2 diabetes mellitus without complications: Secondary | ICD-10-CM | POA: Diagnosis not present

## 2018-12-06 DIAGNOSIS — D638 Anemia in other chronic diseases classified elsewhere: Secondary | ICD-10-CM | POA: Diagnosis not present

## 2018-12-06 DIAGNOSIS — I10 Essential (primary) hypertension: Secondary | ICD-10-CM | POA: Diagnosis not present

## 2018-12-06 DIAGNOSIS — C787 Secondary malignant neoplasm of liver and intrahepatic bile duct: Secondary | ICD-10-CM | POA: Diagnosis not present

## 2018-12-06 DIAGNOSIS — R630 Anorexia: Secondary | ICD-10-CM | POA: Diagnosis not present

## 2018-12-06 DIAGNOSIS — C183 Malignant neoplasm of hepatic flexure: Secondary | ICD-10-CM | POA: Diagnosis not present

## 2018-12-06 DIAGNOSIS — R5383 Other fatigue: Secondary | ICD-10-CM | POA: Diagnosis not present

## 2018-12-06 DIAGNOSIS — I4891 Unspecified atrial fibrillation: Secondary | ICD-10-CM | POA: Diagnosis not present

## 2018-12-06 DIAGNOSIS — Z95828 Presence of other vascular implants and grafts: Secondary | ICD-10-CM

## 2018-12-06 LAB — TYPE AND SCREEN
ABO/RH(D): A POS
Antibody Screen: NEGATIVE
Unit division: 0
Unit division: 0

## 2018-12-06 LAB — BPAM RBC
BLOOD PRODUCT EXPIRATION DATE: 202002122359
Blood Product Expiration Date: 202002122359
ISSUE DATE / TIME: 202001230837
ISSUE DATE / TIME: 202001230837
Unit Type and Rh: 6200
Unit Type and Rh: 6200

## 2018-12-06 MED ORDER — SODIUM CHLORIDE 0.9% FLUSH
10.0000 mL | INTRAVENOUS | Status: DC | PRN
  Administered 2018-12-06: 10 mL via INTRAVENOUS
  Filled 2018-12-06: qty 10

## 2018-12-06 MED ORDER — HEPARIN SOD (PORK) LOCK FLUSH 100 UNIT/ML IV SOLN
500.0000 [IU] | Freq: Once | INTRAVENOUS | Status: AC
  Administered 2018-12-06: 500 [IU] via INTRAVENOUS
  Filled 2018-12-06: qty 5

## 2018-12-06 NOTE — Telephone Encounter (Signed)
-----   Message from Georgianne Fick, RN sent at 12/04/2018  5:28 PM EST ----- Regarding: Dr. Burr Medico First Time FOLFOX Patient received first time Oxaliplatin, Leucovorin and Flourouracil and tolerated these well.

## 2018-12-06 NOTE — Telephone Encounter (Signed)
Spoke with patient in Infusion yesterday was doing well post chemotherapy.  Receiving blood when I saw her.

## 2018-12-07 ENCOUNTER — Ambulatory Visit
Admission: RE | Admit: 2018-12-07 | Discharge: 2018-12-07 | Disposition: A | Discharge status: Home / Self Care | Payer: Medicare HMO | Source: Ambulatory Visit | Attending: Hematology | Admitting: Hematology

## 2018-12-07 DIAGNOSIS — C186 Malignant neoplasm of descending colon: Secondary | ICD-10-CM

## 2018-12-07 DIAGNOSIS — C189 Malignant neoplasm of colon, unspecified: Secondary | ICD-10-CM | POA: Diagnosis not present

## 2018-12-07 MED ORDER — GADOBENATE DIMEGLUMINE 529 MG/ML IV SOLN
17.0000 mL | Freq: Once | INTRAVENOUS | Status: AC | PRN
  Administered 2018-12-07: 17 mL via INTRAVENOUS

## 2018-12-09 ENCOUNTER — Telehealth: Payer: Self-pay

## 2018-12-09 ENCOUNTER — Encounter: Payer: Self-pay | Admitting: Hematology

## 2018-12-09 NOTE — Telephone Encounter (Signed)
Per patient's request faxed clearance to Dr. Francia Greaves office (dentist) to have crown and inlays recemented.  Also faxed a letter to [employer] and [employer] stating patient will remain on medical leave and will be reassessed in 3 months.

## 2018-12-11 NOTE — Progress Notes (Signed)
[place]   [phone] [phone]   Clinic Follow up Note   Patient Care Team: [name], MD as PCP - General (Family Medicine)  Date of Service:  12/13/2018  CHIEF COMPLAINT: F/u of newly diagnosed colon cancer and IDA  SUMMARY OF ONCOLOGIC HISTORY:   Cancer of left colon ([place])   10/25/2018 Procedure    10/25/2018 Colonoscopy Impression: -Two 4 to 6 mm sessile polyps were found in the sigmoid colon. Resected and retrieved. -A 6 mm sessile polyp was found in the descending colon. Resected and retrieved. -Three 5 to 15 mm sessile polyps were found in the transverse colon. Resection and retrieval were complete. -A 12 mm sessile polyp was found in the cecum. Resection and retrieval were complete.  -A 20 mm sessile polyp was found in the ascending colon. Resection and retrieval were complete. . -A malignancy partially obstructing large mass was found at 55 cm proximal to the anus. Biopsied and tattooed. -Malignant partially obstructing tumor 20 cm proximal to the anus. Biopsied and tattooed. -Diverticulosis in the sigmoid colon, in the descending colon and in the transverse colon.     10/25/2018 Imaging    10/25/2018 Endoscopy Impression: -Normal esophagus -Z-line regular 35 cm from the incisors. -Non-bleeding erosive gastropathy -Erythematous mucosa in the antrum. Biopsied. -Normal examined duodenum. Biopsied.    10/25/2018 Cancer Staging    Staging form: Colon and Rectum, AJCC 8th Edition - Clinical stage from 10/25/2018: Stage Unknown (cTX, cN0, cM1) - Signed by Truitt Merle, MD on 11/22/2018    10/30/2018 Initial Diagnosis    Cancer of left colon ([place])    10/30/2018 Initial Biopsy    Final Diagnosis: 10/30/18 1. Small intestine-Duedenum, Biospy:   Benign 2.Stomach-Antrum, Biospy:   Chronic inactive Gastritis 3. Large intestine-Sigmoid Colon, Polyp:   Tubular adenomas (2) 4. Large interstine-Descending Colon, Polyp:   Tubular  adenoma with high grade dysplasia, suspicious for invasion.  5. Large intestine-transverse colon, Polyp:   Tubulovilous Adenomas (3).  6. Large Intestine-Cecum, Polp:   Tubular adenoma  7. Large Intestine-Ascending Colon, Polyp:  Tubulovilous Adenoma 8. Large intestine, Biopsy, Mass 55cm:  Invasive moderately differentiated adenocarcinoma.  9. Large intestine, biopsy, Mass 20cm:   Tubulovilous Adenoma    11/01/2018 Imaging    CT CAP W contrast 11/01/18  IMPRESSION: CT CHEST: 1. Right paratracheal adenopathy (immediately superior to the azygos vein) which short axis dimension of 1.2 cm. In the present clinical setting it is possible this is related to metastatic involvement. 2. Scattered pulmonary parenchymal changes none of which are highly suspicious for metastatic disease. 3. Abnormal appearance of the thyroid gland with left lower lobe ill-defined mass spanning over 3 cm. Thyroid ultrasound can be performed for further delineation. 4. Cardiomegaly. Coronary artery calcification. 5.  Aortic Atherosclerosis (ICD10-I70.0).  CT ABDOMEN PELVIS: 1. Numerous hepatic lesions suspicious for metastatic disease largest within the caudate lobe measuring up to 3.2 cm. 2. Gallbladder wall thickening. This may be related to increased right heart pressure or liver disease but could not exclude cholecystitis in the proper clinical setting. 3. Numerous splenic lesions which in the present clinical setting is suspicious for combination of metastatic disease and splenic cysts. 4. 3.2 cm left adrenal mass suspicious for metastatic disease. 5. Question sigmoid colon and possibly proximal descending colon mass. Rectosigmoid colon mass also not excluded. Correlation with colonoscopy results recommended. 6. Prominent number of colonic diverticula. Third spacing of fluid makes it difficult to evaluate for the possibility of diverticulitis. Radiopaque 1.5  cm structure within the right colon may be  related to ingested foreign body. 7. Low-density fatty appearing structures in the external iliac region/pelvic sidewall bilaterally with adjacent low-density iliac lymph nodes and retroperitoneal lymph nodes raises possibility of low-density metastatic adenopathy. Prominent size portacaval lymph node which short axis dimension of 1.3 cm. PET-CT could be obtained for further delineation if clinically desired. 8. Gas within the urinary bladder may be related to recent manipulation. Clinical correlation recommended. 9. Left adnexal 2.4 cm cyst. This can be assessed with pelvic sonogram.    12/04/2018 -  Chemotherapy    FOLFOX every 2 weeks starting 12/04/18    12/07/2018 Imaging    MRI Abdomen 12/07/18  IMPRESSION: 1. The overall improvement with resolution of the number of the hepatic metastatic lesions. The dominant lesion in the caudate lobe is reduced in size from previous 4.0 by 3.3 cm to current 3.8 by 2.7 cm. 2. The splenic lesions are similar to prior and nonspecific. 3. 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 There is a cystic component laterally which is probably incidental rather than from a collision lesion. 4. Hepatic hemochromatosis. 5. 7 mm gallstone in the common bile duct compatible with choledocholithiasis. There also multiple gallstones in the gallbladder. 6.  Aortic Atherosclerosis (ICD10-I70.0). 7. Bosniak category 2 cyst in the right kidney upper pole.      CURRENT THERAPY:  -IVIronSucrose as needed. -FOLFOX every 2 weeks startingon 12/04/18  INTERVAL HISTORY:  Cassandra Allen is here for a follow up after cycle 1 chemo. She presents to the clinic today by herself. She notes she tolerating first cycle well. She denies black stool since she does not take IV iron frequency.  She notes only being slightly fatigued. She does note not eating much after starting chemo so she has lost a little weight. She has started taking nutritional  supplements. She plans to move to [place] soon to move in with her son.  She has a left tooth that needs to be extracted as the crown is no longer there. This is not bothering her currently but would like to get this removed at some point.  I reviewed her medication list with her. She requested another port bracelet.    REVIEW OF SYSTEMS:   Constitutional: Denies fevers, chills (+) Lower appetite, slight weight loss (+) Mild fatigue  Eyes: Denies blurriness of vision Ears, nose, mouth, throat, and face: Denies mucositis or sore throat Respiratory: Denies cough, dyspnea or wheezes Cardiovascular: Denies palpitation, chest discomfort or lower extremity swelling Gastrointestinal:  Denies nausea, heartburn or change in bowel habits Skin: Denies abnormal skin rashes Lymphatics: Denies new lymphadenopathy or easy bruising Neurological:Denies numbness, tingling or new weaknesses Behavioral/Psych: Mood is stable, no new changes  All other systems were reviewed with the patient and are negative.  MEDICAL HISTORY:  Past Medical History:  Diagnosis Date  . Arthritis   . Cancer ([place])   . Diabetes mellitus without complication ([place])   . Hypertension     SURGICAL HISTORY: Past Surgical History:  Procedure Laterality Date  . IR IMAGING GUIDED PORT INSERTION  11/20/2018    I have reviewed the social history and family history with the patient and they are unchanged from previous note.  ALLERGIES:  is allergic to metformin and related and prednisone.  MEDICATIONS:  Current Outpatient Medications  Medication Sig Dispense Refill  . apixaban (ELIQUIS) 5 MG TABS tablet TAKE 1 TABLET (5 MG TOTAL) BY MOUTH 2 (TWO) TIMES DAILY. [place]  tablet 2  . beta carotene w/minerals (OCUVITE) tablet Take 1 tablet by mouth daily at 12 noon.    . cholecalciferol (VITAMIN D) 1000 units tablet Take 1,000 Units by mouth at bedtime.    Marland Kitchen diltiazem (CARDIZEM CD) 120 MG 24 hr capsule Take 1 capsule (120 mg total) by  mouth 2 (two) times daily. 180 capsule 3  . diphenoxylate-atropine (LOMOTIL) 2.5-0.025 MG tablet Take 1 to 2 tablets 4 times daily as needed 40 tablet 3  . ferrous sulfate 325 (65 FE) MG EC tablet Take 325 mg by mouth daily with breakfast.     . HYDROcodone-acetaminophen (NORCO) 7.5-325 MG tablet Take 1 tablet by mouth 2 (two) times daily as needed (for pain.).     . Insulin Detemir (LEVEMIR FLEXTOUCH) 100 UNIT/ML Pen Inject 10 Units into the skin daily.    Marland Kitchen lidocaine-prilocaine (EMLA) cream Apply to affected area once 30 g 3  . lidocaine-prilocaine (EMLA) cream Apply 1 application topically as needed. 30 g 2  . liraglutide (VICTOZA) 18 MG/3ML SOPN Inject 1.8 mg into the skin every morning.    Marland Kitchen losartan-hydrochlorothiazide (HYZAAR) 100-12.5 MG tablet Take 1 tablet by mouth daily.     . Multiple Vitamin (MULTIVITAMIN WITH MINERALS) TABS tablet Take 2 tablets by mouth daily at 12 noon.     . ondansetron (ZOFRAN) 8 MG tablet Take 1 tablet (8 mg total) by mouth 2 (two) times daily as needed for refractory nausea / vomiting. Start on day 3 after chemotherapy. 30 tablet 1  . oxybutynin (DITROPAN) 5 MG tablet Take 20 mg by mouth daily.    Marland Kitchen oxybutynin (OXYTROL) 3.9 MG/24HR Place 1 patch onto the skin 2 (two) times a week.    . prochlorperazine (COMPAZINE) 10 MG tablet Take 1 tablet (10 mg total) by mouth every 6 (six) hours as needed (Nausea or vomiting). 30 tablet 1  . sertraline (ZOLOFT) 100 MG tablet Take 100 mg by mouth daily at 12 noon.     . simethicone (MYLICON) 80 MG chewable tablet Chew 80-160 mg by mouth 2 (two) times daily as needed for flatulence.     . simvastatin (ZOCOR) 20 MG tablet Take 20 mg by mouth daily.     No current facility-administered medications for this visit.     PHYSICAL EXAMINATION: ECOG PERFORMANCE STATUS: 1 - Symptomatic but completely ambulatory  Vitals:   12/13/18 1154  BP: 124/65  Pulse: 77  Resp: 18  Temp: 98.3 F (36.8 C)  SpO2: 100%   Filed Weights     12/13/18 1154  Weight: 177 lb 6.4 oz (80.5 kg)    GENERAL:alert, no distress and comfortable SKIN: skin color, texture, turgor are normal, no rashes or significant lesions EYES: normal, Conjunctiva are pink and non-injected, sclera clear OROPHARYNX:no exudate, no erythema and lips, buccal mucosa, and tongue normal  NECK: supple, thyroid normal size, non-tender, without nodularity LYMPH:  no palpable lymphadenopathy in the cervical, axillary or inguinal LUNGS: clear to auscultation and percussion with normal breathing effort HEART: regular rate & rhythm and no murmurs and no lower extremity edema ABDOMEN:abdomen soft, non-tender and normal bowel sounds Musculoskeletal:no cyanosis of digits and no clubbing  NEURO: alert & oriented x 3 with fluent speech, no focal motor/sensory deficits  LABORATORY DATA:  I have reviewed the data as listed CBC Latest Ref Rng & Units 12/13/2018 12/04/2018 11/22/2018  WBC 4.0 - 10.5 K/uL 8.5 7.8 8.2  Hemoglobin 12.0 - 15.0 g/dL 9.2(L) 7.5(L) 9.7(L)  Hematocrit 36.0 - 46.0 %  29.1(L) 25.0(L) 32.5(L)  Platelets 150 - 400 K/uL 193 189 245     CMP Latest Ref Rng & Units 12/04/2018 11/20/2018 11/11/2018  Glucose 70 - 99 mg/dL 182(H) 137(H) 127(H)  BUN 8 - 23 mg/dL 31(H) 31(H) 30(H)  Creatinine 0.44 - 1.00 mg/dL 1.01(H) 0.95 0.93  Sodium 135 - 145 mmol/L 144 142 142  Potassium 3.5 - 5.1 mmol/L 4.1 4.2 4.6  Chloride 98 - 111 mmol/L 113(H) 107 106  CO2 22 - 32 mmol/L 23 26 26   Calcium 8.9 - 10.3 mg/dL 8.0(L) 8.7(L) 8.6(L)  Total Protein 6.5 - 8.1 g/dL 5.4(L) 6.9 6.8  Total Bilirubin 0.3 - 1.2 mg/dL 0.3 0.7 1.3(H)  Alkaline Phos 38 - 126 U/L 42 45 42  AST 15 - 41 U/L 8(L) 13(L) 28  ALT 0 - 44 U/L 8 11 13       RADIOGRAPHIC STUDIES: I have personally reviewed the radiological images as listed and agreed with the findings in the report. No results found.   ASSESSMENT & PLAN:  Cassandra Allen is a 73 y.o. female with   1.Left colon  Cancer,G2,TxNxcM1 -She was recently diagnosed withInvasive moderately differentiated adenocarcinomaof left colon in 10/2018.She presented with iron deficient anemia -She CT CAP from 11/01/18 showed several hepatic lesions suspicious for metastasis. Her subsequent liver biopsy from 11/20/18 was negativefor malignancy.I spoke with Dr. Jacelyn Grip did the biopsy,her liver lesion was not very clear on the ultrasound, and was in a difficult spot for biopsy.  -We discussed her MRI from 12/07/18 which shows multiple lesions in the liver which are suspicious for cancer. There is also iron deposited in her liver which interfered with the signal of the scan.   -Based on MRI, the liver lesions are most likely metastasis, also benign lesion are not ruled out. I recommend PET for further evaluation. This would be important to know how many metastatic lesions in the liver, to the regimen if she is a candidate for liver resection or ablation for curative purposes, down the road.  We discussed that some of the metastatic colon cancer can be cured with chemo, surgery, and targeted therapy. -If liver lesions on PET are hypermetabolic, I do not plan to repeat biopsy. -She started FOLFOX on 12/04/18. She tolerated first cycle well with mild fatigue and lower appetite. I encouraged her to continue with nutritional supplement.  -F/u next week for cycle 2.   2. Severe anemia, iron deficientanemia and anemia of chronic disease -Currently on IV iron sucrose 200mg as needed. Previously had a severe reaction to Feraheme. BM biopsy was negative for MDS or malignancy. -Hg at 9.2 today (12/13/18), no blood transfusion or IV iron today.  -Her 11/2018 MRI shows iron deposits in her liver, will give IV iron more sparingly to prevent iron overload.    3. HTN, AF -Continue medications and f/u with PCP. BO well controlled  -She ison Eliquis -Will monitor closely while on chemotherapy. I encouraged her to monitor at home as  well.   4. DM, obesity -Currently on insulin and Lirraglutide. -f/u with PCP and monitor BG at home -Will monitor closely while on chemotherapy.  5. Social Support  -If she cannot drive herself, her only transportation is her son who works often.  -If she needs help I will refer her SW to aid with transportation as needed.  -She plans to move in with her son in the near future.  -She is not currently working and is out on short term disability.  6.Goal of care discussion  -The patient understands the goal of care is palliative if she does have metastatic cancer. -she is full code now   Plan -lab, flush, f/u and FOLFOX in 1 week  -PET scan in 1-2 weeks    No problem-specific Assessment & Plan notes found for this encounter.   Orders Placed This Encounter  Procedures  . NM PET Image Initial (PI) Skull Base To Thigh    Liver lesions on CT and MRI, biopsy negative for cancer    Standing Status:   Future    Standing Expiration Date:   12/13/2019    Order Specific Question:   If indicated for the ordered procedure, I authorize the administration of a radiopharmaceutical per Radiology protocol    Answer:   Yes    Order Specific Question:   Preferred imaging location?    Answer:   [facility]    Order Specific Question:   Radiology Contrast Protocol - do NOT remove file path    Answer:   [email or web address]   All questions were answered. The patient knows to call the clinic with any problems, questions or concerns. No barriers to learning was detected. I spent 20 minutes counseling the patient face to face. The total time spent in the appointment was 25 minutes and more than 50% was on counseling and review of test results     Truitt Merle, MD 12/13/2018   I, Joslyn Devon, am acting as scribe for Truitt Merle, MD.   I have reviewed the above documentation for accuracy and completeness, and I agree with the above.

## 2018-12-13 ENCOUNTER — Inpatient Hospital Stay: Payer: Medicare HMO

## 2018-12-13 ENCOUNTER — Encounter: Payer: Self-pay | Admitting: Hematology

## 2018-12-13 ENCOUNTER — Inpatient Hospital Stay (HOSPITAL_BASED_OUTPATIENT_CLINIC_OR_DEPARTMENT_OTHER): Payer: Medicare HMO | Admitting: Hematology

## 2018-12-13 ENCOUNTER — Other Ambulatory Visit: Payer: Self-pay

## 2018-12-13 VITALS — BP 124/65 | HR 77 | Temp 98.3°F | Resp 18 | Ht 63.0 in | Wt 177.4 lb

## 2018-12-13 DIAGNOSIS — I4891 Unspecified atrial fibrillation: Secondary | ICD-10-CM | POA: Diagnosis not present

## 2018-12-13 DIAGNOSIS — R630 Anorexia: Secondary | ICD-10-CM | POA: Diagnosis not present

## 2018-12-13 DIAGNOSIS — D638 Anemia in other chronic diseases classified elsewhere: Secondary | ICD-10-CM

## 2018-12-13 DIAGNOSIS — C186 Malignant neoplasm of descending colon: Secondary | ICD-10-CM

## 2018-12-13 DIAGNOSIS — C183 Malignant neoplasm of hepatic flexure: Secondary | ICD-10-CM | POA: Diagnosis not present

## 2018-12-13 DIAGNOSIS — R5383 Other fatigue: Secondary | ICD-10-CM

## 2018-12-13 DIAGNOSIS — E119 Type 2 diabetes mellitus without complications: Secondary | ICD-10-CM

## 2018-12-13 DIAGNOSIS — I10 Essential (primary) hypertension: Secondary | ICD-10-CM | POA: Diagnosis not present

## 2018-12-13 DIAGNOSIS — Z7901 Long term (current) use of anticoagulants: Secondary | ICD-10-CM

## 2018-12-13 DIAGNOSIS — D509 Iron deficiency anemia, unspecified: Secondary | ICD-10-CM

## 2018-12-13 DIAGNOSIS — C787 Secondary malignant neoplasm of liver and intrahepatic bile duct: Secondary | ICD-10-CM

## 2018-12-13 DIAGNOSIS — Z79899 Other long term (current) drug therapy: Secondary | ICD-10-CM

## 2018-12-13 DIAGNOSIS — D500 Iron deficiency anemia secondary to blood loss (chronic): Secondary | ICD-10-CM

## 2018-12-13 DIAGNOSIS — Z794 Long term (current) use of insulin: Secondary | ICD-10-CM

## 2018-12-13 LAB — CBC WITH DIFFERENTIAL (CANCER CENTER ONLY)
Abs Immature Granulocytes: 0.05 10*3/uL (ref 0.00–0.07)
Basophils Absolute: 0 10*3/uL (ref 0.0–0.1)
Basophils Relative: 0 %
Eosinophils Absolute: 0.1 10*3/uL (ref 0.0–0.5)
Eosinophils Relative: 1 %
HCT: 29.1 % — ABNORMAL LOW (ref 36.0–46.0)
Hemoglobin: 9.2 g/dL — ABNORMAL LOW (ref 12.0–15.0)
Immature Granulocytes: 1 %
LYMPHS ABS: 0.8 10*3/uL (ref 0.7–4.0)
Lymphocytes Relative: 9 %
MCH: 30.7 pg (ref 26.0–34.0)
MCHC: 31.6 g/dL (ref 30.0–36.0)
MCV: 97 fL (ref 80.0–100.0)
Monocytes Absolute: 0.6 10*3/uL (ref 0.1–1.0)
Monocytes Relative: 7 %
Neutro Abs: 6.9 10*3/uL (ref 1.7–7.7)
Neutrophils Relative %: 82 %
Platelet Count: 193 10*3/uL (ref 150–400)
RBC: 3 MIL/uL — ABNORMAL LOW (ref 3.87–5.11)
RDW: 15.2 % (ref 11.5–15.5)
WBC Count: 8.5 10*3/uL (ref 4.0–10.5)
nRBC: 0 % (ref 0.0–0.2)

## 2018-12-13 LAB — SAMPLE TO BLOOD BANK

## 2018-12-13 MED ORDER — HEPARIN SOD (PORK) LOCK FLUSH 100 UNIT/ML IV SOLN
500.0000 [IU] | Freq: Once | INTRAVENOUS | Status: AC
  Administered 2018-12-13: 500 [IU] via INTRAVENOUS
  Filled 2018-12-13: qty 5

## 2018-12-13 NOTE — Patient Instructions (Signed)

## 2018-12-16 ENCOUNTER — Telehealth: Payer: Self-pay | Admitting: Hematology

## 2018-12-16 NOTE — Telephone Encounter (Signed)
Called central radiology so they can call the patient and get her PET scan scheduled per 01/31 [facility] radiology scheduler stated they will give her a call and schedule that appt.

## 2018-12-16 NOTE — Progress Notes (Signed)
[name]   [phone] [phone]   Clinic Follow up Note   Patient Care Team: [name], MD as PCP - General (Family Medicine)  Date of Service:  12/18/2018  CHIEF COMPLAINT: F/u of newly diagnosed colon cancer and IDA  SUMMARY OF ONCOLOGIC HISTORY:   Cancer of left colon ([place])   10/25/2018 Procedure    10/25/2018 Colonoscopy Impression: -Two 4 to 6 mm sessile polyps were found in the sigmoid colon. Resected and retrieved. -A 6 mm sessile polyp was found in the descending colon. Resected and retrieved. -Three 5 to 15 mm sessile polyps were found in the transverse colon. Resection and retrieval were complete. -A 12 mm sessile polyp was found in the cecum. Resection and retrieval were complete.  -A 20 mm sessile polyp was found in the ascending colon. Resection and retrieval were complete. . -A malignancy partially obstructing large mass was found at 55 cm proximal to the anus. Biopsied and tattooed. -Malignant partially obstructing tumor 20 cm proximal to the anus. Biopsied and tattooed. -Diverticulosis in the sigmoid colon, in the descending colon and in the transverse colon.     10/25/2018 Imaging    10/25/2018 Endoscopy Impression: -Normal esophagus -Z-line regular 35 cm from the incisors. -Non-bleeding erosive gastropathy -Erythematous mucosa in the antrum. Biopsied. -Normal examined duodenum. Biopsied.    10/25/2018 Cancer Staging    Staging form: Colon and Rectum, AJCC 8th Edition - Clinical stage from 10/25/2018: Stage Unknown (cTX, cN0, cM1) - Signed by Truitt Merle, MD on 11/22/2018    10/30/2018 Initial Diagnosis    Cancer of left colon ([place])    10/30/2018 Initial Biopsy    Final Diagnosis: 10/30/18 1. Small intestine-Duedenum, Biospy:   Benign 2.Stomach-Antrum, Biospy:   Chronic inactive Gastritis 3. Large intestine-Sigmoid Colon, Polyp:   Tubular adenomas (2) 4. Large interstine-Descending Colon, Polyp:   Tubular  adenoma with high grade dysplasia, suspicious for invasion.  5. Large intestine-transverse colon, Polyp:   Tubulovilous Adenomas (3).  6. Large Intestine-Cecum, Polp:   Tubular adenoma  7. Large Intestine-Ascending Colon, Polyp:  Tubulovilous Adenoma 8. Large intestine, Biopsy, Mass 55cm:  Invasive moderately differentiated adenocarcinoma.  9. Large intestine, biopsy, Mass 20cm:   Tubulovilous Adenoma    11/01/2018 Imaging    CT CAP W contrast 11/01/18  IMPRESSION: CT CHEST: 1. Right paratracheal adenopathy (immediately superior to the azygos vein) which short axis dimension of 1.2 cm. In the present clinical setting it is possible this is related to metastatic involvement. 2. Scattered pulmonary parenchymal changes none of which are highly suspicious for metastatic disease. 3. Abnormal appearance of the thyroid gland with left lower lobe ill-defined mass spanning over 3 cm. Thyroid ultrasound can be performed for further delineation. 4. Cardiomegaly. Coronary artery calcification. 5.  Aortic Atherosclerosis (ICD10-I70.0).  CT ABDOMEN PELVIS: 1. Numerous hepatic lesions suspicious for metastatic disease largest within the caudate lobe measuring up to 3.2 cm. 2. Gallbladder wall thickening. This may be related to increased right heart pressure or liver disease but could not exclude cholecystitis in the proper clinical setting. 3. Numerous splenic lesions which in the present clinical setting is suspicious for combination of metastatic disease and splenic cysts. 4. 3.2 cm left adrenal mass suspicious for metastatic disease. 5. Question sigmoid colon and possibly proximal descending colon mass. Rectosigmoid colon mass also not excluded. Correlation with colonoscopy results recommended. 6. Prominent number of colonic diverticula. Third spacing of fluid makes it difficult to evaluate for the possibility of diverticulitis. Radiopaque 1.5  cm structure within the right colon may be  related to ingested foreign body. 7. Low-density fatty appearing structures in the external iliac region/pelvic sidewall bilaterally with adjacent low-density iliac lymph nodes and retroperitoneal lymph nodes raises possibility of low-density metastatic adenopathy. Prominent size portacaval lymph node which short axis dimension of 1.3 cm. PET-CT could be obtained for further delineation if clinically desired. 8. Gas within the urinary bladder may be related to recent manipulation. Clinical correlation recommended. 9. Left adnexal 2.4 cm cyst. This can be assessed with pelvic sonogram.    12/04/2018 -  Chemotherapy    FOLFOX every 2 weeks starting 12/04/18    12/07/2018 Imaging    MRI Abdomen 12/07/18  IMPRESSION: 1. The overall improvement with resolution of the number of the hepatic metastatic lesions. The dominant lesion in the caudate lobe is reduced in size from previous 4.0 by 3.3 cm to current 3.8 by 2.7 cm. 2. The splenic lesions are similar to prior and nonspecific. 3. 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 There is a cystic component laterally which is probably incidental rather than from a collision lesion. 4. Hepatic hemochromatosis. 5. 7 mm gallstone in the common bile duct compatible with choledocholithiasis. There also multiple gallstones in the gallbladder. 6.  Aortic Atherosclerosis (ICD10-I70.0). 7. Bosniak category 2 cyst in the right kidney upper pole.      CURRENT THERAPY:  -IVIronSucrose as needed. -FOLFOX every 2 weeks startingon 12/04/18  INTERVAL HISTORY:  Cassandra Allen is here for a follow up and treatment. She presents to the clinic today by herself. She notes her stool is normal colored and no signs of blood. She denies abdominal pain and notes she is ready for cycle 2 treatment today.     REVIEW OF SYSTEMS:   Constitutional: Denies fevers, chills or abnormal weight loss Eyes: Denies blurriness of vision Ears, nose,  mouth, throat, and face: Denies mucositis or sore throat Respiratory: Denies cough, dyspnea or wheezes Cardiovascular: Denies palpitation, chest discomfort or lower extremity swelling Gastrointestinal:  Denies nausea, heartburn or change in bowel habits Skin: Denies abnormal skin rashes Lymphatics: Denies new lymphadenopathy or easy bruising Neurological:Denies numbness, tingling or new weaknesses Behavioral/Psych: Mood is stable, no new changes  All other systems were reviewed with the patient and are negative.  MEDICAL HISTORY:  Past Medical History:  Diagnosis Date  . Arthritis   . Cancer ([place])   . Diabetes mellitus without complication ([place])   . Hypertension     SURGICAL HISTORY: Past Surgical History:  Procedure Laterality Date  . IR IMAGING GUIDED PORT INSERTION  11/20/2018    I have reviewed the social history and family history with the patient and they are unchanged from previous note.  ALLERGIES:  is allergic to metformin and related and prednisone.  MEDICATIONS:  Current Outpatient Medications  Medication Sig Dispense Refill  . apixaban (ELIQUIS) 5 MG TABS tablet TAKE 1 TABLET (5 MG TOTAL) BY MOUTH 2 (TWO) TIMES DAILY. 180 tablet 2  . beta carotene w/minerals (OCUVITE) tablet Take 1 tablet by mouth daily at 12 noon.    . cholecalciferol (VITAMIN D) 1000 units tablet Take 1,000 Units by mouth at bedtime.    Marland Kitchen diltiazem (CARDIZEM CD) 120 MG 24 hr capsule Take 1 capsule (120 mg total) by mouth 2 (two) times daily. 180 capsule 3  . diphenoxylate-atropine (LOMOTIL) 2.5-0.025 MG tablet Take 1 to 2 tablets 4 times daily as needed 40 tablet 3  . ferrous sulfate 325 (65 FE)  MG EC tablet Take 325 mg by mouth daily with breakfast.     . HYDROcodone-acetaminophen (NORCO) 7.5-325 MG tablet Take 1 tablet by mouth 2 (two) times daily as needed (for pain.).     . Insulin Detemir (LEVEMIR FLEXTOUCH) 100 UNIT/ML Pen Inject 10 Units into the skin daily.    Marland Kitchen lidocaine-prilocaine (EMLA)  cream Apply to affected area once 30 g 3  . lidocaine-prilocaine (EMLA) cream Apply 1 application topically as needed. 30 g 2  . liraglutide (VICTOZA) 18 MG/3ML SOPN Inject 1.8 mg into the skin every morning.    Marland Kitchen losartan-hydrochlorothiazide (HYZAAR) 100-12.5 MG tablet Take 1 tablet by mouth daily.     . Multiple Vitamin (MULTIVITAMIN WITH MINERALS) TABS tablet Take 2 tablets by mouth daily at 12 noon.     . ondansetron (ZOFRAN) 8 MG tablet Take 1 tablet (8 mg total) by mouth 2 (two) times daily as needed for refractory nausea / vomiting. Start on day 3 after chemotherapy. 30 tablet 1  . oxybutynin (DITROPAN) 5 MG tablet Take 20 mg by mouth daily.    Marland Kitchen oxybutynin (OXYTROL) 3.9 MG/24HR Place 1 patch onto the skin 2 (two) times a week.    . prochlorperazine (COMPAZINE) 10 MG tablet Take 1 tablet (10 mg total) by mouth every 6 (six) hours as needed (Nausea or vomiting). 30 tablet 1  . sertraline (ZOLOFT) 100 MG tablet Take 100 mg by mouth daily at 12 noon.     . simethicone (MYLICON) 80 MG chewable tablet Chew 80-160 mg by mouth 2 (two) times daily as needed for flatulence.     . simvastatin (ZOCOR) 20 MG tablet Take 20 mg by mouth daily.     No current facility-administered medications for this visit.     PHYSICAL EXAMINATION: ECOG PERFORMANCE STATUS: 1 - Symptomatic but completely ambulatory  Vitals:   12/18/18 1113  BP: 109/65  Pulse: 91  Resp: 18  Temp: 98.3 F (36.8 C)  SpO2: 95%   Filed Weights   12/18/18 1113  Weight: 180 lb 8 oz (81.9 kg)    GENERAL:alert, no distress and comfortable SKIN: skin color, texture, turgor are normal, no rashes or significant lesions EYES: normal, Conjunctiva are pink and non-injected, sclera clear OROPHARYNX:no exudate, no erythema and lips, buccal mucosa, and tongue normal  NECK: supple, thyroid normal size, non-tender, without nodularity LYMPH:  no palpable lymphadenopathy in the cervical, axillary or inguinal LUNGS: clear to auscultation  and percussion with normal breathing effort HEART: regular rate & rhythm and no murmurs and no lower extremity edema ABDOMEN:abdomen soft, non-tender and normal bowel sounds Musculoskeletal:no cyanosis of digits and no clubbing  NEURO: alert & oriented x 3 with fluent speech, no focal motor/sensory deficits  LABORATORY DATA:  I have reviewed the data as listed CBC Latest Ref Rng & Units 12/18/2018 12/13/2018 12/04/2018  WBC 4.0 - 10.5 K/uL 6.3 8.5 7.8  Hemoglobin 12.0 - 15.0 g/dL 8.8(L) 9.2(L) 7.5(L)  Hematocrit 36.0 - 46.0 % 28.7(L) 29.1(L) 25.0(L)  Platelets 150 - 400 K/uL 142(L) 193 189     CMP Latest Ref Rng & Units 12/18/2018 12/04/2018 11/20/2018  Glucose 70 - 99 mg/dL 165(H) 182(H) 137(H)  BUN 8 - 23 mg/dL 37(H) 31(H) 31(H)  Creatinine 0.44 - 1.00 mg/dL 1.04(H) 1.01(H) 0.95  Sodium 135 - 145 mmol/L 141 144 142  Potassium 3.5 - 5.1 mmol/L 4.4 4.1 4.2  Chloride 98 - 111 mmol/L 107 113(H) 107  CO2 22 - 32 mmol/L 28 23 26  Calcium 8.9 - 10.3 mg/dL 8.6(L) 8.0(L) 8.7(L)  Total Protein 6.5 - 8.1 g/dL 5.8(L) 5.4(L) 6.9  Total Bilirubin 0.3 - 1.2 mg/dL 0.5 0.3 0.7  Alkaline Phos 38 - 126 U/L 147(H) 42 45  AST 15 - 41 U/L 60(H) 8(L) 13(L)  ALT 0 - 44 U/L 117(H) 8 11      RADIOGRAPHIC STUDIES: I have personally reviewed the radiological images as listed and agreed with the findings in the report. No results found.   ASSESSMENT & PLAN:  MORAIMA BURD is a 73 y.o. female with   1.Left colon Cancer,G2,TxNxcM1 -She was recently diagnosed withInvasive moderately differentiated adenocarcinomaof left colon in 10/2018.She presented with iron deficient anemia -She CT CAP from 11/01/18 showed several hepatic lesions suspicious for metastasis. Her subsequent liver biopsy from 11/20/18 was negativefor malignancy.I spoke with Dr. Jacelyn Grip didthebiopsy,her liver lesion was not very clear on the ultrasound, and was in a difficult spot for biopsy. -She started FOLFOX on 12/04/18. She  tolerated first cycle well with mild fatigue and lower appetite.  -Her 11/2018 MRI showed suspicious lesions in liver, but for better diagnostics will have PET scan on 12/27/18. If liver lesions on PET are hypermetabolic, I do not plan to repeat biopsy.  She is scheduled for PET scan next week. -Labs reviewed, Hg at 8.8, Glucose at 165, BUN, 27, cr at 1.04, Ca at 8.6, mildly elevated LFTs. Overall adequate to proceed with cycle 2 FOLFOX today.  -her FO is still pending, will determine if Avastin or EGFR inhibitor will be added based on the result  -F/u in 2 weeks before cycle 3   2. Severe anemia, iron deficientanemia and anemia of chronic disease -Currently on IV iron sucrose 224mas needed. Previously had a severe reaction to Feraheme. BM biopsy was negative for MDS or malignancy. -Her 11/2018 MRI shows iron deposits in her liver, will give IV iron more sparingly to prevent iron overload.  -Hg at 8.8 today (12/18/18), no blood transfusion today. Iron panel is still pending.    3. HTN, AF -Continue medications and f/u with PCP. BO well controlled  -She ison Eliquis -Will monitor closely while on chemotherapy. I encouraged her to monitor at home as well. -HTN has remained well controled.    4. DM, obesity -Currently on insulin and Lirraglutide. -f/u with PCP and monitor BG at home -Will monitor closely while on chemotherapy.  5. Social Support  -If she cannot drive herself, her only transportation is her son who works often.  -If she needs help I will refer her SW to aid with transportationas needed.  -She plans to move in with her son in the near future.  -She is not currentlyworkingand is out on short term disability.    6.Goal of care discussion  -The patient understands the goal of care is palliative if she does have metastatic cancer. -she is full code now   Plan -Labs reviewed and adequate to proceed with cycle 2 FOLFOX today  -Lab, flush, f/u and  chemo FOLFOX in 2 weeks  -PET scan on 12/27/18 -pending FO result, and determine about adding biological agent    No problem-specific Assessment & Plan notes found for this encounter.   No orders of the defined types were placed in this encounter.  All questions were answered. The patient knows to call the clinic with any problems, questions or concerns. No barriers to learning was detected. I spent 15 minutes counseling the patient face to face. The total time spent in  the appointment was 20 minutes and more than 50% was on counseling and review of test results     Truitt Merle, MD 12/18/2018   I, Joslyn Devon, am acting as scribe for Truitt Merle, MD.   I have reviewed the above documentation for accuracy and completeness, and I agree with the above.

## 2018-12-18 ENCOUNTER — Telehealth: Payer: Self-pay

## 2018-12-18 ENCOUNTER — Inpatient Hospital Stay: Payer: Medicare HMO | Attending: Hematology and Oncology

## 2018-12-18 ENCOUNTER — Inpatient Hospital Stay: Payer: Medicare HMO

## 2018-12-18 ENCOUNTER — Other Ambulatory Visit: Payer: Self-pay

## 2018-12-18 ENCOUNTER — Inpatient Hospital Stay (HOSPITAL_BASED_OUTPATIENT_CLINIC_OR_DEPARTMENT_OTHER): Payer: Medicare HMO | Admitting: Hematology

## 2018-12-18 VITALS — BP 109/65 | HR 91 | Temp 98.3°F | Resp 18 | Ht 63.0 in | Wt 180.5 lb

## 2018-12-18 DIAGNOSIS — E119 Type 2 diabetes mellitus without complications: Secondary | ICD-10-CM | POA: Insufficient documentation

## 2018-12-18 DIAGNOSIS — Z7901 Long term (current) use of anticoagulants: Secondary | ICD-10-CM

## 2018-12-18 DIAGNOSIS — C186 Malignant neoplasm of descending colon: Secondary | ICD-10-CM | POA: Diagnosis not present

## 2018-12-18 DIAGNOSIS — Z79899 Other long term (current) drug therapy: Secondary | ICD-10-CM

## 2018-12-18 DIAGNOSIS — D509 Iron deficiency anemia, unspecified: Secondary | ICD-10-CM

## 2018-12-18 DIAGNOSIS — I10 Essential (primary) hypertension: Secondary | ICD-10-CM

## 2018-12-18 DIAGNOSIS — E669 Obesity, unspecified: Secondary | ICD-10-CM

## 2018-12-18 DIAGNOSIS — C787 Secondary malignant neoplasm of liver and intrahepatic bile duct: Secondary | ICD-10-CM | POA: Insufficient documentation

## 2018-12-18 DIAGNOSIS — D638 Anemia in other chronic diseases classified elsewhere: Secondary | ICD-10-CM | POA: Diagnosis not present

## 2018-12-18 DIAGNOSIS — D500 Iron deficiency anemia secondary to blood loss (chronic): Secondary | ICD-10-CM

## 2018-12-18 DIAGNOSIS — R7989 Other specified abnormal findings of blood chemistry: Secondary | ICD-10-CM | POA: Diagnosis not present

## 2018-12-18 DIAGNOSIS — Z794 Long term (current) use of insulin: Secondary | ICD-10-CM | POA: Diagnosis not present

## 2018-12-18 DIAGNOSIS — Z95828 Presence of other vascular implants and grafts: Secondary | ICD-10-CM

## 2018-12-18 DIAGNOSIS — Z452 Encounter for adjustment and management of vascular access device: Secondary | ICD-10-CM | POA: Insufficient documentation

## 2018-12-18 DIAGNOSIS — Z5111 Encounter for antineoplastic chemotherapy: Secondary | ICD-10-CM | POA: Diagnosis not present

## 2018-12-18 LAB — CMP (CANCER CENTER ONLY)
ALK PHOS: 147 U/L — AB (ref 38–126)
ALT: 117 U/L — AB (ref 0–44)
AST: 60 U/L — ABNORMAL HIGH (ref 15–41)
Albumin: 3.2 g/dL — ABNORMAL LOW (ref 3.5–5.0)
Anion gap: 6 (ref 5–15)
BUN: 37 mg/dL — AB (ref 8–23)
CO2: 28 mmol/L (ref 22–32)
Calcium: 8.6 mg/dL — ABNORMAL LOW (ref 8.9–10.3)
Chloride: 107 mmol/L (ref 98–111)
Creatinine: 1.04 mg/dL — ABNORMAL HIGH (ref 0.44–1.00)
GFR, Est AFR Am: >60 mL/min (ref >60)
GFR, Estimated: 54 mL/min — ABNORMAL LOW (ref >60)
GLUCOSE: 165 mg/dL — AB (ref 70–99)
Potassium: 4.4 mmol/L (ref 3.5–5.1)
Sodium: 141 mmol/L (ref 135–145)
Total Bilirubin: 0.5 mg/dL (ref 0.3–1.2)
Total Protein: 5.8 g/dL — ABNORMAL LOW (ref 6.5–8.1)

## 2018-12-18 LAB — CBC WITH DIFFERENTIAL (CANCER CENTER ONLY)
Abs Immature Granulocytes: 0.02 10*3/uL (ref 0.00–0.07)
BASOS ABS: 0 10*3/uL (ref 0.0–0.1)
Basophils Relative: 0 %
Eosinophils Absolute: 0.1 10*3/uL (ref 0.0–0.5)
Eosinophils Relative: 2 %
HCT: 28.7 % — ABNORMAL LOW (ref 36.0–46.0)
Hemoglobin: 8.8 g/dL — ABNORMAL LOW (ref 12.0–15.0)
Immature Granulocytes: 0 %
Lymphocytes Relative: 8 %
Lymphs Abs: 0.5 10*3/uL — ABNORMAL LOW (ref 0.7–4.0)
MCH: 29.8 pg (ref 26.0–34.0)
MCHC: 30.7 g/dL (ref 30.0–36.0)
MCV: 97.3 fL (ref 80.0–100.0)
Monocytes Absolute: 0.8 10*3/uL (ref 0.1–1.0)
Monocytes Relative: 13 %
Neutro Abs: 4.8 10*3/uL (ref 1.7–7.7)
Neutrophils Relative %: 77 %
PLATELETS: 142 10*3/uL — AB (ref 150–400)
RBC: 2.95 MIL/uL — ABNORMAL LOW (ref 3.87–5.11)
RDW: 16.2 % — ABNORMAL HIGH (ref 11.5–15.5)
WBC Count: 6.3 10*3/uL (ref 4.0–10.5)
nRBC: 0 % (ref 0.0–0.2)

## 2018-12-18 LAB — FERRITIN: Ferritin: 629 ng/mL — ABNORMAL HIGH (ref 11–307)

## 2018-12-18 LAB — IRON AND TIBC
Iron: 26 ug/dL — ABNORMAL LOW (ref 41–142)
Saturation Ratios: 11 % — ABNORMAL LOW (ref 21–57)
TIBC: 236 ug/dL (ref 236–444)
UIBC: 210 ug/dL (ref 120–384)

## 2018-12-18 LAB — TOTAL PROTEIN, URINE DIPSTICK: PROTEIN: NEGATIVE mg/dL

## 2018-12-18 MED ORDER — DEXTROSE 5 % IV SOLN
Freq: Once | INTRAVENOUS | Status: AC
  Administered 2018-12-18: 14:00:00 via INTRAVENOUS
  Filled 2018-12-18: qty 250

## 2018-12-18 MED ORDER — LEUCOVORIN CALCIUM INJECTION 350 MG
400.0000 mg/m2 | Freq: Once | INTRAVENOUS | Status: AC
  Administered 2018-12-18: 760 mg via INTRAVENOUS
  Filled 2018-12-18: qty 38

## 2018-12-18 MED ORDER — OXALIPLATIN CHEMO INJECTION 100 MG/20ML
80.0000 mg/m2 | Freq: Once | INTRAVENOUS | Status: AC
  Administered 2018-12-18: 150 mg via INTRAVENOUS
  Filled 2018-12-18: qty 20

## 2018-12-18 MED ORDER — DEXAMETHASONE SODIUM PHOSPHATE 10 MG/ML IJ SOLN
10.0000 mg | Freq: Once | INTRAMUSCULAR | Status: AC
  Administered 2018-12-18: 10 mg via INTRAVENOUS

## 2018-12-18 MED ORDER — DEXAMETHASONE SODIUM PHOSPHATE 10 MG/ML IJ SOLN
INTRAMUSCULAR | Status: AC
  Filled 2018-12-18: qty 1

## 2018-12-18 MED ORDER — SODIUM CHLORIDE 0.9 % IV SOLN
INTRAVENOUS | Status: DC
  Administered 2018-12-18: 13:00:00 via INTRAVENOUS
  Filled 2018-12-18: qty 250

## 2018-12-18 MED ORDER — SODIUM CHLORIDE 0.9 % IV SOLN
2400.0000 mg/m2 | INTRAVENOUS | Status: DC
  Administered 2018-12-18: 4550 mg via INTRAVENOUS
  Filled 2018-12-18: qty 91

## 2018-12-18 MED ORDER — SODIUM CHLORIDE 0.9% FLUSH
10.0000 mL | INTRAVENOUS | Status: DC | PRN
  Filled 2018-12-18: qty 10

## 2018-12-18 MED ORDER — SODIUM CHLORIDE 0.9% FLUSH
10.0000 mL | Freq: Once | INTRAVENOUS | Status: AC
  Administered 2018-12-18: 10 mL
  Filled 2018-12-18: qty 10

## 2018-12-18 MED ORDER — PALONOSETRON HCL INJECTION 0.25 MG/5ML
INTRAVENOUS | Status: AC
  Filled 2018-12-18: qty 5

## 2018-12-18 MED ORDER — DEXTROSE 5 % IV SOLN
Freq: Once | INTRAVENOUS | Status: DC
  Filled 2018-12-18: qty 250

## 2018-12-18 MED ORDER — SODIUM CHLORIDE 0.9 % IV SOLN: 200.0000 mg | Freq: Once | INTRAVENOUS | Status: DC

## 2018-12-18 MED ORDER — SODIUM CHLORIDE 0.9 % IV SOLN: 5.0000 mg/kg | Freq: Once | INTRAVENOUS | Status: DC

## 2018-12-18 MED ORDER — PALONOSETRON HCL INJECTION 0.25 MG/5ML
0.2500 mg | Freq: Once | INTRAVENOUS | Status: AC
  Administered 2018-12-18: 0.25 mg via INTRAVENOUS

## 2018-12-18 MED ORDER — HEPARIN SOD (PORK) LOCK FLUSH 100 UNIT/ML IV SOLN
500.0000 [IU] | Freq: Once | INTRAVENOUS | Status: DC | PRN
  Filled 2018-12-18: qty 5

## 2018-12-18 NOTE — Patient Instructions (Signed)
Price Discharge Instructions for Patients Receiving Chemotherapy  Today you received the following chemotherapy agents Oxaliplatin (ELOXATIN), Leucovorin & Flourouracil (ADRUCIL).  To help prevent nausea and vomiting after your treatment, we encourage you to take your nausea medication as prescribed. DO NOT TAKE ZOFRAN FOR THE NEXT 3 DAYS, TAKE COMPAZINE AS PRESCRIBED.  If you develop nausea and vomiting that is not controlled by your nausea medication, call the clinic.   BELOW ARE SYMPTOMS THAT SHOULD BE REPORTED IMMEDIATELY:  *FEVER GREATER THAN 100.5 F  *CHILLS WITH OR WITHOUT FEVER  NAUSEA AND VOMITING THAT IS NOT CONTROLLED WITH YOUR NAUSEA MEDICATION  *UNUSUAL SHORTNESS OF BREATH  *UNUSUAL BRUISING OR BLEEDING  TENDERNESS IN MOUTH AND THROAT WITH OR WITHOUT PRESENCE OF ULCERS  *URINARY PROBLEMS  *BOWEL PROBLEMS  UNUSUAL RASH Items with * indicate a potential emergency and should be followed up as soon as possible.  Feel free to call the clinic should you have any questions or concerns. The clinic phone number is [phone].  Please show the [place] at check-in to the Emergency Department and triage nurse.   Oxaliplatin Injection What is this medicine? OXALIPLATIN (ox AL i PLA tin) is a chemotherapy drug. It targets fast dividing cells, like cancer cells, and causes these cells to die. This medicine is used to treat cancers of the colon and rectum, and many other cancers. This medicine may be used for other purposes; ask your health care provider or pharmacist if you have questions. COMMON BRAND NAME(S): Eloxatin What should I tell my health care provider before I take this medicine? They need to know if you have any of these conditions: -kidney disease -an unusual or allergic reaction to oxaliplatin, other chemotherapy, other medicines, foods, dyes, or preservatives -pregnant or trying to get pregnant -breast-feeding How should I  use this medicine? This drug is given as an infusion into a vein. It is administered in a hospital or clinic by a specially trained health care professional. Talk to your pediatrician regarding the use of this medicine in children. Special care may be needed. Overdosage: If you think you have taken too much of this medicine contact a poison control center or emergency room at once. NOTE: This medicine is only for you. Do not share this medicine with others. What if I miss a dose? It is important not to miss a dose. Call your doctor or health care professional if you are unable to keep an appointment. What may interact with this medicine? -medicines to increase blood counts like filgrastim, pegfilgrastim, sargramostim -probenecid -some antibiotics like amikacin, gentamicin, neomycin, polymyxin B, streptomycin, tobramycin -zalcitabine Talk to your doctor or health care professional before taking any of these medicines: -acetaminophen -aspirin -ibuprofen -ketoprofen -naproxen This list may not describe all possible interactions. Give your health care provider a list of all the medicines, herbs, non-prescription drugs, or dietary supplements you use. Also tell them if you smoke, drink alcohol, or use illegal drugs. Some items may interact with your medicine. What should I watch for while using this medicine? Your condition will be monitored carefully while you are receiving this medicine. You will need important blood work done while you are taking this medicine. This medicine can make you more sensitive to cold. Do not drink cold drinks or use ice. Cover exposed skin before coming in contact with cold temperatures or cold objects. When out in cold weather wear warm clothing and cover your mouth and nose to warm the air  that goes into your lungs. Tell your doctor if you get sensitive to the cold. This drug may make you feel generally unwell. This is not uncommon, as chemotherapy can affect healthy  cells as well as cancer cells. Report any side effects. Continue your course of treatment even though you feel ill unless your doctor tells you to stop. In some cases, you may be given additional medicines to help with side effects. Follow all directions for their use. Call your doctor or health care professional for advice if you get a fever, chills or sore throat, or other symptoms of a cold or flu. Do not treat yourself. This drug decreases your body's ability to fight infections. Try to avoid being around people who are sick. This medicine may increase your risk to bruise or bleed. Call your doctor or health care professional if you notice any unusual bleeding. Be careful brushing and flossing your teeth or using a toothpick because you may get an infection or bleed more easily. If you have any dental work done, tell your dentist you are receiving this medicine. Avoid taking products that contain aspirin, acetaminophen, ibuprofen, naproxen, or ketoprofen unless instructed by your doctor. These medicines may hide a fever. Do not become pregnant while taking this medicine. Women should inform their doctor if they wish to become pregnant or think they might be pregnant. There is a potential for serious side effects to an unborn child. Talk to your health care professional or pharmacist for more information. Do not breast-feed an infant while taking this medicine. Call your doctor or health care professional if you get diarrhea. Do not treat yourself. What side effects may I notice from receiving this medicine? Side effects that you should report to your doctor or health care professional as soon as possible: -allergic reactions like skin rash, itching or hives, swelling of the face, lips, or tongue -low blood counts - This drug may decrease the number of white blood cells, red blood cells and platelets. You may be at increased risk for infections and bleeding. -signs of infection - fever or chills,  cough, sore throat, pain or difficulty passing urine -signs of decreased platelets or bleeding - bruising, pinpoint red spots on the skin, black, tarry stools, nosebleeds -signs of decreased red blood cells - unusually weak or tired, fainting spells, lightheadedness -breathing problems -chest pain, pressure -cough -diarrhea -jaw tightness -mouth sores -nausea and vomiting -pain, swelling, redness or irritation at the injection site -pain, tingling, numbness in the hands or feet -problems with balance, talking, walking -redness, blistering, peeling or loosening of the skin, including inside the mouth -trouble passing urine or change in the amount of urine Side effects that usually do not require medical attention (report to your doctor or health care professional if they continue or are bothersome): -changes in vision -constipation -hair loss -loss of appetite -metallic taste in the mouth or changes in taste -stomach pain This list may not describe all possible side effects. Call your doctor for medical advice about side effects. You may report side effects to FDA at 1-800-FDA-1088. Where should I keep my medicine? This drug is given in a hospital or clinic and will not be stored at home. NOTE: This sheet is a summary. It may not cover all possible information. If you have questions about this medicine, talk to your doctor, pharmacist, or health care provider.  2019 Else[employer] Standard (2008-05-26 17:22:47)   Leucovorin injection What is this medicine? LEUCOVORIN (loo koe VOR in) is used  to prevent or treat the harmful effects of some medicines. This medicine is used to treat anemia caused by a low amount of folic acid in the body. It is also used with 5-fluorouracil (5-FU) to treat colon cancer. This medicine may be used for other purposes; ask your health care provider or pharmacist if you have questions. What should I tell my health care provider before I take this  medicine? They need to know if you have any of these conditions: -anemia from low levels of vitamin B-12 in the blood -an unusual or allergic reaction to leucovorin, folic acid, other medicines, foods, dyes, or preservatives -pregnant or trying to get pregnant -breast-feeding How should I use this medicine? This medicine is for injection into a muscle or into a vein. It is given by a health care professional in a hospital or clinic setting. Talk to your pediatrician regarding the use of this medicine in children. Special care may be needed. Overdosage: If you think you have taken too much of this medicine contact a poison control center or emergency room at once. NOTE: This medicine is only for you. Do not share this medicine with others. What if I miss a dose? This does not apply. What may interact with this medicine? -capecitabine -fluorouracil -phenobarbital -phenytoin -primidone -trimethoprim-sulfamethoxazole This list may not describe all possible interactions. Give your health care provider a list of all the medicines, herbs, non-prescription drugs, or dietary supplements you use. Also tell them if you smoke, drink alcohol, or use illegal drugs. Some items may interact with your medicine. What should I watch for while using this medicine? Your condition will be monitored carefully while you are receiving this medicine. This medicine may increase the side effects of 5-fluorouracil, 5-FU. Tell your doctor or health care professional if you have diarrhea or mouth sores that do not get better or that get worse. What side effects may I notice from receiving this medicine? Side effects that you should report to your doctor or health care professional as soon as possible: -allergic reactions like skin rash, itching or hives, swelling of the face, lips, or tongue -breathing problems -fever, infection -mouth sores -unusual bleeding or bruising -unusually weak or tired Side effects that  usually do not require medical attention (report to your doctor or health care professional if they continue or are bothersome): -constipation or diarrhea -loss of appetite -nausea, vomiting This list may not describe all possible side effects. Call your doctor for medical advice about side effects. You may report side effects to FDA at 1-800-FDA-1088. Where should I keep my medicine? This drug is given in a hospital or clinic and will not be stored at home. NOTE: This sheet is a summary. It may not cover all possible information. If you have questions about this medicine, talk to your doctor, pharmacist, or health care provider.  2019 Else[employer] Standard (2008-05-05 16:50:29)   Fluorouracil, 5-FU injection What is this medicine? FLUOROURACIL, 5-FU (flure oh YOOR a sil) is a chemotherapy drug. It slows the growth of cancer cells. This medicine is used to treat many types of cancer like breast cancer, colon or rectal cancer, pancreatic cancer, and stomach cancer. This medicine may be used for other purposes; ask your health care provider or pharmacist if you have questions. COMMON BRAND NAME(S): Adrucil What should I tell my health care provider before I take this medicine? They need to know if you have any of these conditions: -blood disorders -dihydropyrimidine dehydrogenase (DPD) deficiency -infection (especially a virus  infection such as chickenpox, cold sores, or herpes) -kidney disease -liver disease -malnourished, poor nutrition -recent or ongoing radiation therapy -an unusual or allergic reaction to fluorouracil, other chemotherapy, other medicines, foods, dyes, or preservatives -pregnant or trying to get pregnant -breast-feeding How should I use this medicine? This drug is given as an infusion or injection into a vein. It is administered in a hospital or clinic by a specially trained health care professional. Talk to your pediatrician regarding the use of this medicine in  children. Special care may be needed. Overdosage: If you think you have taken too much of this medicine contact a poison control center or emergency room at once. NOTE: This medicine is only for you. Do not share this medicine with others. What if I miss a dose? It is important not to miss your dose. Call your doctor or health care professional if you are unable to keep an appointment. What may interact with this medicine? -allopurinol -cimetidine -dapsone -digoxin -hydroxyurea -leucovorin -levamisole -medicines for seizures like ethotoin, fosphenytoin, phenytoin -medicines to increase blood counts like filgrastim, pegfilgrastim, sargramostim -medicines that treat or prevent blood clots like warfarin, enoxaparin, and dalteparin -methotrexate -metronidazole -pyrimethamine -some other chemotherapy drugs like busulfan, cisplatin, estramustine, vinblastine -trimethoprim -trimetrexate -vaccines Talk to your doctor or health care professional before taking any of these medicines: -acetaminophen -aspirin -ibuprofen -ketoprofen -naproxen This list may not describe all possible interactions. Give your health care provider a list of all the medicines, herbs, non-prescription drugs, or dietary supplements you use. Also tell them if you smoke, drink alcohol, or use illegal drugs. Some items may interact with your medicine. What should I watch for while using this medicine? Visit your doctor for checks on your progress. This drug may make you feel generally unwell. This is not uncommon, as chemotherapy can affect healthy cells as well as cancer cells. Report any side effects. Continue your course of treatment even though you feel ill unless your doctor tells you to stop. In some cases, you may be given additional medicines to help with side effects. Follow all directions for their use. Call your doctor or health care professional for advice if you get a fever, chills or sore throat, or other  symptoms of a cold or flu. Do not treat yourself. This drug decreases your body's ability to fight infections. Try to avoid being around people who are sick. This medicine may increase your risk to bruise or bleed. Call your doctor or health care professional if you notice any unusual bleeding. Be careful brushing and flossing your teeth or using a toothpick because you may get an infection or bleed more easily. If you have any dental work done, tell your dentist you are receiving this medicine. Avoid taking products that contain aspirin, acetaminophen, ibuprofen, naproxen, or ketoprofen unless instructed by your doctor. These medicines may hide a fever. Do not become pregnant while taking this medicine. Women should inform their doctor if they wish to become pregnant or think they might be pregnant. There is a potential for serious side effects to an unborn child. Talk to your health care professional or pharmacist for more information. Do not breast-feed an infant while taking this medicine. Men should inform their doctor if they wish to father a child. This medicine may lower sperm counts. Do not treat diarrhea with over the counter products. Contact your doctor if you have diarrhea that lasts more than 2 days or if it is severe and watery. This medicine can make you more  sensitive to the sun. Keep out of the sun. If you cannot avoid being in the sun, wear protective clothing and use sunscreen. Do not use sun lamps or tanning beds/booths. What side effects may I notice from receiving this medicine? Side effects that you should report to your doctor or health care professional as soon as possible: -allergic reactions like skin rash, itching or hives, swelling of the face, lips, or tongue -low blood counts - this medicine may decrease the number of white blood cells, red blood cells and platelets. You may be at increased risk for infections and bleeding. -signs of infection - fever or chills, cough,  sore throat, pain or difficulty passing urine -signs of decreased platelets or bleeding - bruising, pinpoint red spots on the skin, black, tarry stools, blood in the urine -signs of decreased red blood cells - unusually weak or tired, fainting spells, lightheadedness -breathing problems -changes in vision -chest pain -mouth sores -nausea and vomiting -pain, swelling, redness at site where injected -pain, tingling, numbness in the hands or feet -redness, swelling, or sores on hands or feet -stomach pain -unusual bleeding Side effects that usually do not require medical attention (report to your doctor or health care professional if they continue or are bothersome): -changes in finger or toe nails -diarrhea -dry or itchy skin -hair loss -headache -loss of appetite -sensitivity of eyes to the light -stomach upset -unusually teary eyes This list may not describe all possible side effects. Call your doctor for medical advice about side effects. You may report side effects to FDA at 1-800-FDA-1088. Where should I keep my medicine? This drug is given in a hospital or clinic and will not be stored at home. NOTE: This sheet is a summary. It may not cover all possible information. If you have questions about this medicine, talk to your doctor, pharmacist, or health care provider.  2019 [employer] Standard (2008-03-04 13:53:16)

## 2018-12-18 NOTE — Telephone Encounter (Signed)
Printed avs and calender of upcoming appointment. Per 2/5 los 

## 2018-12-18 NOTE — Progress Notes (Signed)
12/18/18  Received order to hold Avastin today due to bleeding per [name].  Informed nursing.  V.O. Dr Feng/Paizley Ouida Sills RN/Acelynn Dejonge Ronnald Ramp PharmD

## 2018-12-18 NOTE — Progress Notes (Signed)
Per Dr. Burr Medico patient is OK to treat with current labs

## 2018-12-19 ENCOUNTER — Encounter: Payer: Self-pay | Admitting: Hematology

## 2018-12-20 ENCOUNTER — Inpatient Hospital Stay: Payer: Medicare HMO

## 2018-12-20 VITALS — BP 118/62 | HR 88 | Temp 98.3°F | Resp 17

## 2018-12-20 DIAGNOSIS — E669 Obesity, unspecified: Secondary | ICD-10-CM | POA: Diagnosis not present

## 2018-12-20 DIAGNOSIS — D638 Anemia in other chronic diseases classified elsewhere: Secondary | ICD-10-CM | POA: Diagnosis not present

## 2018-12-20 DIAGNOSIS — C186 Malignant neoplasm of descending colon: Secondary | ICD-10-CM | POA: Diagnosis not present

## 2018-12-20 DIAGNOSIS — R7989 Other specified abnormal findings of blood chemistry: Secondary | ICD-10-CM | POA: Diagnosis not present

## 2018-12-20 DIAGNOSIS — Z452 Encounter for adjustment and management of vascular access device: Secondary | ICD-10-CM | POA: Diagnosis not present

## 2018-12-20 DIAGNOSIS — D500 Iron deficiency anemia secondary to blood loss (chronic): Secondary | ICD-10-CM | POA: Diagnosis not present

## 2018-12-20 DIAGNOSIS — C787 Secondary malignant neoplasm of liver and intrahepatic bile duct: Secondary | ICD-10-CM | POA: Diagnosis not present

## 2018-12-20 DIAGNOSIS — I10 Essential (primary) hypertension: Secondary | ICD-10-CM | POA: Diagnosis not present

## 2018-12-20 DIAGNOSIS — Z5111 Encounter for antineoplastic chemotherapy: Secondary | ICD-10-CM | POA: Diagnosis not present

## 2018-12-20 MED ORDER — HEPARIN SOD (PORK) LOCK FLUSH 100 UNIT/ML IV SOLN
500.0000 [IU] | Freq: Once | INTRAVENOUS | Status: AC | PRN
  Administered 2018-12-20: 500 [IU]
  Filled 2018-12-20: qty 5

## 2018-12-20 MED ORDER — SODIUM CHLORIDE 0.9% FLUSH
10.0000 mL | INTRAVENOUS | Status: DC | PRN
  Administered 2018-12-20: 10 mL
  Filled 2018-12-20: qty 10

## 2018-12-21 ENCOUNTER — Observation Stay (HOSPITAL_COMMUNITY)
Admission: EM | Admit: 2018-12-21 | Discharge: 2018-12-23 | Disposition: A | Discharge status: Home / Self Care | Payer: Medicare HMO | Attending: Internal Medicine | Admitting: Internal Medicine

## 2018-12-21 ENCOUNTER — Encounter (HOSPITAL_COMMUNITY): Payer: Self-pay | Admitting: Oncology

## 2018-12-21 ENCOUNTER — Other Ambulatory Visit: Payer: Self-pay

## 2018-12-21 ENCOUNTER — Emergency Department (HOSPITAL_COMMUNITY): Payer: Medicare HMO

## 2018-12-21 DIAGNOSIS — I482 Chronic atrial fibrillation, unspecified: Secondary | ICD-10-CM | POA: Diagnosis present

## 2018-12-21 DIAGNOSIS — D509 Iron deficiency anemia, unspecified: Secondary | ICD-10-CM | POA: Insufficient documentation

## 2018-12-21 DIAGNOSIS — Z8249 Family history of ischemic heart disease and other diseases of the circulatory system: Secondary | ICD-10-CM | POA: Diagnosis not present

## 2018-12-21 DIAGNOSIS — R0789 Other chest pain: Secondary | ICD-10-CM | POA: Diagnosis not present

## 2018-12-21 DIAGNOSIS — I080 Rheumatic disorders of both mitral and aortic valves: Secondary | ICD-10-CM | POA: Insufficient documentation

## 2018-12-21 DIAGNOSIS — N179 Acute kidney failure, unspecified: Secondary | ICD-10-CM | POA: Diagnosis not present

## 2018-12-21 DIAGNOSIS — E785 Hyperlipidemia, unspecified: Secondary | ICD-10-CM | POA: Insufficient documentation

## 2018-12-21 DIAGNOSIS — I10 Essential (primary) hypertension: Secondary | ICD-10-CM | POA: Diagnosis present

## 2018-12-21 DIAGNOSIS — I5033 Acute on chronic diastolic (congestive) heart failure: Secondary | ICD-10-CM | POA: Diagnosis not present

## 2018-12-21 DIAGNOSIS — R079 Chest pain, unspecified: Secondary | ICD-10-CM | POA: Diagnosis not present

## 2018-12-21 DIAGNOSIS — E1122 Type 2 diabetes mellitus with diabetic chronic kidney disease: Secondary | ICD-10-CM | POA: Insufficient documentation

## 2018-12-21 DIAGNOSIS — I2510 Atherosclerotic heart disease of native coronary artery without angina pectoris: Secondary | ICD-10-CM | POA: Diagnosis not present

## 2018-12-21 DIAGNOSIS — R9431 Abnormal electrocardiogram [ECG] [EKG]: Secondary | ICD-10-CM | POA: Insufficient documentation

## 2018-12-21 DIAGNOSIS — C186 Malignant neoplasm of descending colon: Secondary | ICD-10-CM | POA: Insufficient documentation

## 2018-12-21 DIAGNOSIS — Z79899 Other long term (current) drug therapy: Secondary | ICD-10-CM | POA: Diagnosis not present

## 2018-12-21 DIAGNOSIS — Z794 Long term (current) use of insulin: Secondary | ICD-10-CM | POA: Diagnosis not present

## 2018-12-21 DIAGNOSIS — N183 Chronic kidney disease, stage 3 (moderate): Secondary | ICD-10-CM | POA: Insufficient documentation

## 2018-12-21 DIAGNOSIS — Z888 Allergy status to other drugs, medicaments and biological substances status: Secondary | ICD-10-CM | POA: Diagnosis not present

## 2018-12-21 DIAGNOSIS — R072 Precordial pain: Secondary | ICD-10-CM | POA: Diagnosis not present

## 2018-12-21 DIAGNOSIS — C185 Malignant neoplasm of splenic flexure: Secondary | ICD-10-CM | POA: Diagnosis present

## 2018-12-21 DIAGNOSIS — I4821 Permanent atrial fibrillation: Secondary | ICD-10-CM | POA: Diagnosis not present

## 2018-12-21 DIAGNOSIS — R262 Difficulty in walking, not elsewhere classified: Secondary | ICD-10-CM | POA: Diagnosis not present

## 2018-12-21 DIAGNOSIS — C189 Malignant neoplasm of colon, unspecified: Secondary | ICD-10-CM

## 2018-12-21 DIAGNOSIS — R000 Tachycardia, unspecified: Secondary | ICD-10-CM | POA: Diagnosis not present

## 2018-12-21 DIAGNOSIS — Z7901 Long term (current) use of anticoagulants: Secondary | ICD-10-CM | POA: Insufficient documentation

## 2018-12-21 DIAGNOSIS — E119 Type 2 diabetes mellitus without complications: Secondary | ICD-10-CM

## 2018-12-21 DIAGNOSIS — D631 Anemia in chronic kidney disease: Secondary | ICD-10-CM | POA: Diagnosis not present

## 2018-12-21 DIAGNOSIS — I130 Hypertensive heart and chronic kidney disease with heart failure and stage 1 through stage 4 chronic kidney disease, or unspecified chronic kidney disease: Secondary | ICD-10-CM | POA: Diagnosis not present

## 2018-12-21 DIAGNOSIS — M25562 Pain in left knee: Secondary | ICD-10-CM | POA: Insufficient documentation

## 2018-12-21 DIAGNOSIS — D649 Anemia, unspecified: Secondary | ICD-10-CM | POA: Diagnosis present

## 2018-12-21 DIAGNOSIS — IMO0001 Reserved for inherently not codable concepts without codable children: Secondary | ICD-10-CM

## 2018-12-21 DIAGNOSIS — F339 Major depressive disorder, recurrent, unspecified: Secondary | ICD-10-CM | POA: Diagnosis present

## 2018-12-21 DIAGNOSIS — R0602 Shortness of breath: Secondary | ICD-10-CM | POA: Diagnosis not present

## 2018-12-21 DIAGNOSIS — F329 Major depressive disorder, single episode, unspecified: Secondary | ICD-10-CM | POA: Diagnosis not present

## 2018-12-21 LAB — CBC
HCT: 30.7 % — ABNORMAL LOW (ref 36.0–46.0)
Hemoglobin: 9.1 g/dL — ABNORMAL LOW (ref 12.0–15.0)
MCH: 28.8 pg (ref 26.0–34.0)
MCHC: 29.6 g/dL — ABNORMAL LOW (ref 30.0–36.0)
MCV: 97.2 fL (ref 80.0–100.0)
Platelets: 202 10*3/uL (ref 150–400)
RBC: 3.16 MIL/uL — ABNORMAL LOW (ref 3.87–5.11)
RDW: 15.2 % (ref 11.5–15.5)
WBC: 2.6 10*3/uL — ABNORMAL LOW (ref 4.0–10.5)
nRBC: 0 % (ref 0.0–0.2)

## 2018-12-21 LAB — COMPREHENSIVE METABOLIC PANEL
ALK PHOS: 84 U/L (ref 38–126)
ALT: 38 U/L (ref 0–44)
AST: 14 U/L — ABNORMAL LOW (ref 15–41)
Albumin: 3 g/dL — ABNORMAL LOW (ref 3.5–5.0)
Anion gap: 13 (ref 5–15)
BUN: 43 mg/dL — ABNORMAL HIGH (ref 8–23)
CALCIUM: 8.7 mg/dL — AB (ref 8.9–10.3)
CO2: 25 mmol/L (ref 22–32)
Chloride: 102 mmol/L (ref 98–111)
Creatinine, Ser: 1.47 mg/dL — ABNORMAL HIGH (ref 0.44–1.00)
GFR calc Af Amer: 41 mL/min — ABNORMAL LOW (ref >60)
GFR calc non Af Amer: 35 mL/min — ABNORMAL LOW (ref >60)
Glucose, Bld: 149 mg/dL — ABNORMAL HIGH (ref 70–99)
Potassium: 4.1 mmol/L (ref 3.5–5.1)
Sodium: 140 mmol/L (ref 135–145)
Total Bilirubin: 0.6 mg/dL (ref 0.3–1.2)
Total Protein: 5.5 g/dL — ABNORMAL LOW (ref 6.5–8.1)

## 2018-12-21 LAB — I-STAT TROPONIN, ED: Troponin i, poc: 0.02 ng/mL (ref 0.00–0.08)

## 2018-12-21 MED ORDER — SODIUM CHLORIDE 0.9 % IV BOLUS
500.0000 mL | Freq: Once | INTRAVENOUS | Status: AC
  Administered 2018-12-21: 500 mL via INTRAVENOUS

## 2018-12-21 MED ORDER — SODIUM CHLORIDE 0.9% FLUSH: 3.0000 mL | Freq: Once | INTRAVENOUS | Status: DC

## 2018-12-21 NOTE — ED Notes (Signed)
Patient transported to X-ray 

## 2018-12-21 NOTE — ED Provider Notes (Addendum)
[name] EMERGENCY DEPARTMENT Provider Note   CSN: [id number] Arrival date & time: 12/21/18  2033     History   Chief Complaint Chief Complaint  Patient presents with  . Chest Pain    HPI Cassandra Allen is a 73 y.o. female.  Patient with hx afib, colon ca, presents c/o mid chest pain for past day. Symptoms acute onset, moderate, persistent, non radiating. Had a couple episodes last night at rest, lasted a couple minutes. Symptoms recurred tonight, this time radiating to right shoulder. No back/flank pain. No abd pain. No pleuritic pain. Denies cough or uri symptoms. No fever or chills. No heartburn. No leg pain or swelling. No hx dvt or pe. No associated nv, diaphoresis or sob.   The history is provided by the patient and the EMS personnel.  Chest Pain  Associated symptoms: no abdominal pain, no back pain, no fever, no headache, no shortness of breath and no vomiting     Past Medical History:  Diagnosis Date  . Arthritis   . Cancer ([place])   . Diabetes mellitus without complication ([place])   . Hypertension     Patient Active Problem List   Diagnosis Date Noted  . Port-A-Cath in place 12/04/2018  . Goals of care, counseling/discussion 11/22/2018  . GI bleed 11/02/2018  . Rectal bleed   . Cancer of left colon ([place]) 10/30/2018  . Anemia 07/06/2018  . Iron deficiency anemia 04/04/2018  . 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 Pain in right ankle and joints of right foot 01/10/2018  . Acute renal failure ([place]) 03/25/2015  . Essential (primary) hypertension 03/25/2015  . Diabetes mellitus without complication ([place]) 30/16/0109  . AKI (acute kidney injury) ([place]) 03/25/2015    Past Surgical History:  Procedure Laterality Date  . IR IMAGING GUIDED PORT INSERTION  11/20/2018     OB History   No obstetric history on file.      Home Medications    Prior to Admission medications     Medication Sig Start Date End Date Taking? Authorizing Provider  apixaban (ELIQUIS) 5 MG TABS tablet TAKE 1 TABLET (5 MG TOTAL) BY MOUTH 2 (TWO) TIMES DAILY. 09/19/18   [name], NP  beta carotene w/minerals (OCUVITE) tablet Take 1 tablet by mouth daily at 12 noon.    [provider]  cholecalciferol (VITAMIN D) 1000 units tablet Take 1,000 Units by mouth at bedtime.    [provider]  diltiazem (CARDIZEM CD) 120 MG 24 hr capsule Take 1 capsule (120 mg total) by mouth 2 (two) times daily. 11/15/18   [name], NP  diphenoxylate-atropine (LOMOTIL) 2.5-0.025 MG tablet Take 1 to 2 tablets 4 times daily as needed 09/18/18   Alla Feeling, NP  ferrous sulfate 325 (65 FE) MG EC tablet Take 325 mg by mouth daily with breakfast.     [provider]  HYDROcodone-acetaminophen (NORCO) 7.5-325 MG tablet Take 1 tablet by mouth 2 (two) times daily as needed (for pain.).     [provider]  Insulin Detemir (LEVEMIR FLEXTOUCH) 100 UNIT/ML Pen Inject 10 Units into the skin daily.    [provider]  lidocaine-prilocaine (EMLA) cream Apply to affected area once 11/22/18   Truitt Merle, MD  lidocaine-prilocaine (EMLA) cream Apply 1 application topically as needed. 11/27/18   Truitt Merle, MD  liraglutide (VICTOZA) 18 MG/3ML SOPN Inject 1.8 mg into the skin every morning.    [provider]  losartan-hydrochlorothiazide (HYZAAR) 100-12.5 MG tablet Take 1 tablet by mouth daily.     [provider]  Multiple Vitamin (MULTIVITAMIN WITH MINERALS) TABS tablet Take 2 tablets by mouth daily at 12 noon.     [provider]  ondansetron (ZOFRAN) 8 MG tablet Take 1 tablet (8 mg total) by mouth 2 (two) times daily as needed for refractory nausea / vomiting. Start on day 3 after chemotherapy. 11/22/18   Truitt Merle, MD  oxybutynin (DITROPAN) 5 MG tablet Take 20 mg by mouth daily.    [provider]  oxybutynin (OXYTROL) 3.9 MG/24HR Place 1 patch  onto the skin 2 (two) times a week.    [provider]  prochlorperazine (COMPAZINE) 10 MG tablet Take 1 tablet (10 mg total) by mouth every 6 (six) hours as needed (Nausea or vomiting). 11/22/18   Truitt Merle, MD  sertraline (ZOLOFT) 100 MG tablet Take 100 mg by mouth daily at 12 noon.     [provider]  simethicone (MYLICON) 80 MG chewable tablet Chew 80-160 mg by mouth 2 (two) times daily as needed for flatulence.  02/23/15   [provider]  simvastatin (ZOCOR) 20 MG tablet Take 20 mg by mouth daily.    [provider]    Family History No family history on file.  Social History Social History   Tobacco Use  . Smoking status: Never Smoker  . Smokeless tobacco: Never Used  Substance Use Topics  . Alcohol use: No  . Drug use: No     Allergies   Metformin and related and Prednisone   Review of Systems Review of Systems  Constitutional: Negative for fever.  HENT: Negative for sore throat.   Eyes: Negative for redness.  Respiratory: Negative for shortness of breath.   Cardiovascular: Positive for chest pain. Negative for leg swelling.  Gastrointestinal: Negative for abdominal pain and vomiting.  Genitourinary: Negative for flank pain.  Musculoskeletal: Negative for back pain and neck pain.  Skin: Negative for rash.  Neurological: Negative for headaches.  Hematological: Does not bruise/bleed easily.  Psychiatric/Behavioral: Negative for confusion.     Physical Exam Updated Vital Signs BP 116/74 (BP Location: Right Arm)   Pulse (!) 112   Temp 99 F (37.2 C) (Oral)   Resp 17   Ht 1.6 m (5\' 3" )   Wt 81 kg   SpO2 93%   BMI 31.63 kg/m   Physical Exam Vitals signs and nursing note reviewed.  Constitutional:      Appearance: Normal appearance. She is well-developed.  HENT:     Head: Atraumatic.     Nose: Nose normal.     Mouth/Throat:     Mouth: Mucous membranes are moist.  Eyes:     General: No scleral icterus.     Conjunctiva/sclera: Conjunctivae normal.     Pupils: Pupils are equal, round, and reactive to light.  Neck:     Musculoskeletal: Normal range of motion and neck supple. No neck rigidity or muscular tenderness.     Trachea: No tracheal deviation.  Cardiovascular:     Rate and Rhythm: Normal rate and regular rhythm.     Pulses: Normal pulses.     Heart sounds: Normal heart sounds. No murmur. No friction rub. No gallop.   Pulmonary:     Effort: Pulmonary effort is normal. No respiratory distress.     Breath sounds: Normal breath sounds.     Comments: Port right chest without sign of infection.  Abdominal:     General: Bowel sounds are normal. There is no distension.     Palpations: Abdomen is soft.     Tenderness: There is no abdominal tenderness. There is no guarding.  Genitourinary:    Comments: No cva tenderness.  Musculoskeletal:        General: No swelling or tenderness.  Skin:    General: Skin is warm and dry.     Findings: No rash.  Neurological:     Mental Status: She is alert.     Comments: Alert, speech normal.   Psychiatric:        Mood and Affect: Mood normal.      ED Treatments / Results  Labs (all labs ordered are listed, but only abnormal results are displayed) Results for orders placed or performed during the hospital encounter of 12/21/18  CBC  Result Value Ref Range   WBC 2.6 (L) 4.0 - 10.5 K/uL   RBC 3.16 (L) 3.87 - 5.11 MIL/uL   Hemoglobin 9.1 (L) 12.0 - 15.0 g/dL   HCT 30.7 (L) 36.0 - 46.0 %   MCV 97.2 80.0 - 100.0 fL   MCH 28.8 26.0 - 34.0 pg   MCHC 29.6 (L) 30.0 - 36.0 g/dL   RDW 15.2 11.5 - 15.5 %   Platelets 202 150 - 400 K/uL   nRBC 0.0 0.0 - 0.2 %  Comprehensive metabolic panel  Result Value Ref Range   Sodium 140 135 - 145 mmol/L   Potassium 4.1 3.5 - 5.1 mmol/L   Chloride 102 98 - 111 mmol/L   CO2 25 22 - 32 mmol/L   Glucose, Bld 149 (H) 70 - 99 mg/dL   BUN 43 (H) 8 - 23 mg/dL   Creatinine, Ser 1.47 (H) 0.44 - 1.00 mg/dL   Calcium  8.7 (L) 8.9 - 10.3 mg/dL   Total Protein 5.5 (L) 6.5 - 8.1 g/dL   Albumin 3.0 (L) 3.5 - 5.0 g/dL   AST 14 (L) 15 - 41 U/L   ALT 38 0 - 44 U/L   Alkaline Phosphatase 84 38 - 126 U/L   Total Bilirubin 0.6 0.3 - 1.2 mg/dL   GFR calc non Af Amer 35 (L) >60 mL/min   GFR calc Af Amer 41 (L) >60 mL/min   Anion gap 13 5 - 15  I-stat troponin, ED  Result Value Ref Range   Troponin i, poc 0.02 0.00 - 0.08 ng/mL   Comment 3           Dg Chest 2 View  Result Date: 12/21/2018 CLINICAL DATA:  Midline chest pain.  Shortness of breath EXAM: CHEST - 2 VIEW COMPARISON:  Chest radiograph 11/02/2018 FINDINGS: Right anterior chest wall Port-A-Cath is present with tip projecting over the superior vena cava. Monitoring leads overlie the patient. Cardiomegaly. Pulmonary vascular redistribution. Mild interstitial opacities. No pleural effusion. Thoracic spine degenerative changes. IMPRESSION: Cardiomegaly. Pulmonary vascular redistribution and mild interstitial edema. Electronically Signed   By: Lovey Newcomer M.D.   On: 12/21/2018 21:11   Mr Abdomen W Wo Contrast  Result Date: 12/07/2018 CLINICAL DATA:  Colon cancer with liver lesions and a left adrenal abnormality, for further workup. EXAM: MRI ABDOMEN WITHOUT AND WITH CONTRAST TECHNIQUE: Multiplanar multisequence MR imaging of the abdomen was performed both before and after the administration of intravenous contrast. CONTRAST:  31mL MULTIHANCE GADOBENATE DIMEGLUMINE 529 MG/ML IV SOLN COMPARISON:  CT abdomen 11/01/2018 FINDINGS: Lower chest: Cardiomegaly. Hepatobiliary: Multiple small gallstones are present in the gallbladder.  There is choledocholithiasis with a 7 mm in long axis stone in the distal CBD on image 24/2. Considerably reduced in phase signal in the liver with associated low T2 signal in the liver favoring hemochromatosis. Although on the prior exam the scattered hypodense liver lesions were not well-defined, on today's exam most of these lesions are  essentially occult. The exception is the caudate lobe lesion which currently measures 3.8 by 2.7 cm, previously 4.0 by 3.3 cm by CT. There also some vague areas of faint enhancement in the lateral segment left hepatic lobe fall which could reflect residua of prior metastatic lesions. A 7 mm focus of enhancement in the right hepatic lobe on image 32/19 likewise could reflect a prior metastatic lesion but was previously 1.4 cm on the prior CT. The overall pattern is one of significant improvement. Pancreas: 5 mm nonenhancing T2 hyperintense lesion in the pancreatic head on image 23/7, likely clinically inconsequential given the context of the patient's history. Otherwise unremarkable. Spleen: In addition to a small inferior splenic cyst, there are several small foci of enhancement in the spleen including a stable peripheral 1.1 by 0.8 cm lesion on image 30/18 which may right may represent metastatic disease, but which are technically nonspecific. Adrenals/Urinary Tract: There is considerable signal dropout of the vast majority of the left adrenal mass on out of phase images. The mass measures 3.5 by 2.8 cm. A small cystic lateral portion of the mass does not dropout in signal. Still, the overall appearance favors adenoma over a collision lesion with an adenoma. Chronic appearing bilateral perirenal stranding. No hydronephrosis. A 1.2 cm lesion with high precontrast T1 signal characteristics in the right kidney upper pole is compatible with a Bosniak category 2 cyst. Stomach/Bowel: Unremarkable Vascular/Lymphatic: Aortoiliac atherosclerotic vascular disease. No pathologic adenopathy identified. Other:  No supplemental non-categorized findings. Musculoskeletal: Lumbar spondylosis and degenerative disc disease. IMPRESSION: 1. The overall improvement with resolution of the number of the hepatic metastatic lesions. The dominant lesion in the caudate lobe is reduced in size from previous 4.0 by 3.3 cm to current 3.8 by 2.7  cm. 2. The splenic lesions are similar to prior and nonspecific. 3. 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 There is a cystic component laterally which is probably incidental rather than from a collision lesion. 4. Hepatic hemochromatosis. 5. 7 mm gallstone in the common bile duct compatible with choledocholithiasis. There also multiple gallstones in the gallbladder. 6.  Aortic Atherosclerosis (ICD10-I70.0). 7. Bosniak category 2 cyst in the right kidney upper pole. Electronically Signed   By: Van Clines M.D.   On: 12/07/2018 12:35    EKG EKG Interpretation  Date/Time:  Saturday December 21 2018 20:44:16 EST Ventricular Rate:  115 PR Interval:    QRS Duration: 87 QT Interval:  344 QTC Calculation: 476 R Axis:   8 Text Interpretation:  Atrial fibrillation Non-specific ST-t changes Confirmed by Lajean Saver [phone]) on 12/21/2018 8:49:05 PM   Radiology Dg Chest 2 View  Result Date: 12/21/2018 CLINICAL DATA:  Midline chest pain.  Shortness of breath EXAM: CHEST - 2 VIEW COMPARISON:  Chest radiograph 11/02/2018 FINDINGS: Right anterior chest wall Port-A-Cath is present with tip projecting over the superior vena cava. Monitoring leads overlie the patient. Cardiomegaly. Pulmonary vascular redistribution. Mild interstitial opacities. No pleural effusion. Thoracic spine degenerative changes. IMPRESSION: Cardiomegaly. Pulmonary vascular redistribution and mild interstitial edema. Electronically Signed   By: Lovey Newcomer M.D.   On: 12/21/2018 21:11    Procedures Procedures (including critical  care time)  Medications Ordered in ED Medications  sodium chloride flush (NS) 0.9 % injection 3 mL (has no administration in time range)     Initial Impression / Assessment and Plan / ED Course  I have reviewed the triage vital signs and the nursing notes.  Pertinent labs & imaging results that were available during my care of the patient were reviewed by me and considered in my  medical decision making (see chart for details).  Iv ns. Labs. Ecg. Cxr.   Reviewed nursing notes and prior charts for additional history. Patient with widely metastatic colon ca.   Labs reviewed - initial trop normal. Mild aki on labs, pt w recent chemo, ?poor po intake. Iv ns bolus.   cxr reviewed - no pna.  Will plan to get delta trop.  Pt with chronic afib, states compliant w meds incl eliquis therapy - currently hr 90, rr 16, pulse ox 98%.  Given new chest pain, recent chemo w aki  - will plan for admission.   Hospitalists consulted for admission.           Final Clinical Impressions(s) / ED Diagnoses   Final diagnoses:  None    ED Discharge Orders    None       Lajean Saver, MD 12/21/18 Darci Needle    Lajean Saver, MD 12/21/18 [phone]

## 2018-12-21 NOTE — ED Notes (Signed)
Pt request blood draw from port 

## 2018-12-21 NOTE — ED Triage Notes (Signed)
Pt bib [facility] from home d/t substernal CP w/ radiation to right arm and shob.  Pt given 324 asa and nitro x 1 w/ relief of pain and shob.  Pt reported to EMS that she had these sx last night as well that subsided.  Pt currently denies pain. Pt currently undergoing chemo tx for colon CA.

## 2018-12-22 ENCOUNTER — Other Ambulatory Visit (HOSPITAL_COMMUNITY): Payer: Self-pay

## 2018-12-22 ENCOUNTER — Other Ambulatory Visit: Payer: Self-pay

## 2018-12-22 ENCOUNTER — Encounter (HOSPITAL_COMMUNITY): Payer: Self-pay | Admitting: Internal Medicine

## 2018-12-22 DIAGNOSIS — I2510 Atherosclerotic heart disease of native coronary artery without angina pectoris: Secondary | ICD-10-CM | POA: Diagnosis not present

## 2018-12-22 DIAGNOSIS — R0789 Other chest pain: Secondary | ICD-10-CM | POA: Diagnosis not present

## 2018-12-22 DIAGNOSIS — I10 Essential (primary) hypertension: Secondary | ICD-10-CM | POA: Diagnosis not present

## 2018-12-22 DIAGNOSIS — E785 Hyperlipidemia, unspecified: Secondary | ICD-10-CM | POA: Diagnosis not present

## 2018-12-22 DIAGNOSIS — E119 Type 2 diabetes mellitus without complications: Secondary | ICD-10-CM

## 2018-12-22 DIAGNOSIS — I482 Chronic atrial fibrillation, unspecified: Secondary | ICD-10-CM

## 2018-12-22 DIAGNOSIS — N183 Chronic kidney disease, stage 3 (moderate): Secondary | ICD-10-CM | POA: Diagnosis not present

## 2018-12-22 DIAGNOSIS — I130 Hypertensive heart and chronic kidney disease with heart failure and stage 1 through stage 4 chronic kidney disease, or unspecified chronic kidney disease: Secondary | ICD-10-CM | POA: Diagnosis not present

## 2018-12-22 DIAGNOSIS — N179 Acute kidney failure, unspecified: Secondary | ICD-10-CM

## 2018-12-22 DIAGNOSIS — I5031 Acute diastolic (congestive) heart failure: Secondary | ICD-10-CM

## 2018-12-22 DIAGNOSIS — I5033 Acute on chronic diastolic (congestive) heart failure: Secondary | ICD-10-CM | POA: Diagnosis not present

## 2018-12-22 DIAGNOSIS — R079 Chest pain, unspecified: Secondary | ICD-10-CM | POA: Diagnosis present

## 2018-12-22 DIAGNOSIS — N182 Chronic kidney disease, stage 2 (mild): Secondary | ICD-10-CM | POA: Diagnosis not present

## 2018-12-22 DIAGNOSIS — F339 Major depressive disorder, recurrent, unspecified: Secondary | ICD-10-CM | POA: Diagnosis present

## 2018-12-22 DIAGNOSIS — C186 Malignant neoplasm of descending colon: Secondary | ICD-10-CM

## 2018-12-22 DIAGNOSIS — D631 Anemia in chronic kidney disease: Secondary | ICD-10-CM | POA: Diagnosis not present

## 2018-12-22 DIAGNOSIS — E1122 Type 2 diabetes mellitus with diabetic chronic kidney disease: Secondary | ICD-10-CM | POA: Diagnosis not present

## 2018-12-22 DIAGNOSIS — D509 Iron deficiency anemia, unspecified: Secondary | ICD-10-CM | POA: Diagnosis not present

## 2018-12-22 HISTORY — DX: Chronic atrial fibrillation, unspecified: I48.20

## 2018-12-22 HISTORY — DX: Acute diastolic (congestive) heart failure: I50.31

## 2018-12-22 LAB — RENAL FUNCTION PANEL
Albumin: 3.1 g/dL — ABNORMAL LOW (ref 3.5–5.0)
Anion gap: 9 (ref 5–15)
BUN: 35 mg/dL — ABNORMAL HIGH (ref 8–23)
CO2: 25 mmol/L (ref 22–32)
Calcium: 8.8 mg/dL — ABNORMAL LOW (ref 8.9–10.3)
Chloride: 107 mmol/L (ref 98–111)
Creatinine, Ser: 1.19 mg/dL — ABNORMAL HIGH (ref 0.44–1.00)
GFR calc Af Amer: 53 mL/min — ABNORMAL LOW (ref >60)
GFR calc non Af Amer: 46 mL/min — ABNORMAL LOW (ref >60)
Glucose, Bld: 114 mg/dL — ABNORMAL HIGH (ref 70–99)
Phosphorus: 3.6 mg/dL (ref 2.5–4.6)
Potassium: 4.5 mmol/L (ref 3.5–5.1)
Sodium: 141 mmol/L (ref 135–145)

## 2018-12-22 LAB — GLUCOSE, CAPILLARY
GLUCOSE-CAPILLARY: 186 mg/dL — AB (ref 70–99)
Glucose-Capillary: 123 mg/dL — ABNORMAL HIGH (ref 70–99)
Glucose-Capillary: 106 mg/dL — ABNORMAL HIGH (ref 70–99)
Glucose-Capillary: 143 mg/dL — ABNORMAL HIGH (ref 70–99)

## 2018-12-22 LAB — TROPONIN I
Troponin I: 0.04 ng/mL (ref <0.03)
Troponin I: <0.03 ng/mL (ref <0.03)
Troponin I: <0.03 ng/mL (ref <0.03)
Troponin I: <0.03 ng/mL (ref <0.03)

## 2018-12-22 LAB — MAGNESIUM: Magnesium: 2 mg/dL (ref 1.7–2.4)

## 2018-12-22 LAB — RAPID URINE DRUG SCREEN, HOSP PERFORMED
Amphetamines: NOT DETECTED
Barbiturates: NOT DETECTED
Benzodiazepines: NOT DETECTED
Cocaine: NOT DETECTED
OPIATES: POSITIVE — AB
Tetrahydrocannabinol: NOT DETECTED

## 2018-12-22 LAB — APTT: aPTT: 29 seconds (ref 24–36)

## 2018-12-22 LAB — PROTIME-INR
INR: 1.3
Prothrombin Time: 16.1 seconds — ABNORMAL HIGH (ref 11.4–15.2)

## 2018-12-22 LAB — I-STAT TROPONIN, ED: Troponin i, poc: 0.05 ng/mL (ref 0.00–0.08)

## 2018-12-22 LAB — BRAIN NATRIURETIC PEPTIDE: B Natriuretic Peptide: 388.1 pg/mL — ABNORMAL HIGH (ref 0.0–100.0)

## 2018-12-22 MED ORDER — FUROSEMIDE 10 MG/ML IJ SOLN
20.0000 mg | Freq: Once | INTRAMUSCULAR | Status: AC
  Administered 2018-12-22: 20 mg via INTRAVENOUS
  Filled 2018-12-22: qty 2

## 2018-12-22 MED ORDER — DICLOFENAC SODIUM 1 % TD GEL
2.0000 g | Freq: Four times a day (QID) | TRANSDERMAL | Status: DC | PRN
  Administered 2018-12-22 – 2018-12-23 (×2): 2 g via TOPICAL
  Filled 2018-12-22: qty 100

## 2018-12-22 MED ORDER — ALUM & MAG HYDROXIDE-SIMETH 200-200-20 MG/5ML PO SUSP
15.0000 mL | Freq: Once | ORAL | Status: DC
  Filled 2018-12-22: qty 30

## 2018-12-22 MED ORDER — ACETAMINOPHEN 325 MG PO TABS
650.0000 mg | ORAL_TABLET | ORAL | Status: DC | PRN
  Administered 2018-12-23: 650 mg via ORAL
  Filled 2018-12-22: qty 2

## 2018-12-22 MED ORDER — DILTIAZEM HCL ER COATED BEADS 120 MG PO CP24
120.0000 mg | ORAL_CAPSULE | Freq: Two times a day (BID) | ORAL | Status: DC
  Administered 2018-12-22 – 2018-12-23 (×4): 120 mg via ORAL
  Filled 2018-12-22 (×5): qty 1

## 2018-12-22 MED ORDER — DIPHENOXYLATE-ATROPINE 2.5-0.025 MG PO TABS: 1.0000 | ORAL_TABLET | Freq: Four times a day (QID) | ORAL | Status: DC | PRN

## 2018-12-22 MED ORDER — SIMVASTATIN 20 MG PO TABS
20.0000 mg | ORAL_TABLET | Freq: Every day | ORAL | Status: DC
  Administered 2018-12-22 (×2): 20 mg via ORAL
  Filled 2018-12-22 (×2): qty 1

## 2018-12-22 MED ORDER — HYDROCODONE-ACETAMINOPHEN 7.5-325 MG PO TABS
1.0000 | ORAL_TABLET | Freq: Two times a day (BID) | ORAL | Status: DC | PRN
  Administered 2018-12-22 – 2018-12-23 (×2): 1 via ORAL
  Filled 2018-12-22 (×2): qty 1

## 2018-12-22 MED ORDER — PROSIGHT PO TABS
1.0000 | ORAL_TABLET | Freq: Every day | ORAL | Status: DC
  Administered 2018-12-22 – 2018-12-23 (×2): 1 via ORAL
  Filled 2018-12-22 (×2): qty 1

## 2018-12-22 MED ORDER — ONDANSETRON HCL 4 MG/2ML IJ SOLN: 4.0000 mg | Freq: Four times a day (QID) | INTRAMUSCULAR | Status: DC | PRN

## 2018-12-22 MED ORDER — SERTRALINE HCL 100 MG PO TABS
100.0000 mg | ORAL_TABLET | Freq: Every day | ORAL | Status: DC
  Administered 2018-12-22 – 2018-12-23 (×2): 100 mg via ORAL
  Filled 2018-12-22 (×2): qty 1

## 2018-12-22 MED ORDER — INSULIN ASPART 100 UNIT/ML ~~LOC~~ SOLN
0.0000 [IU] | Freq: Three times a day (TID) | SUBCUTANEOUS | Status: DC
  Administered 2018-12-22: 1 [IU] via SUBCUTANEOUS
  Administered 2018-12-22: 2 [IU] via SUBCUTANEOUS
  Administered 2018-12-23: 1 [IU] via SUBCUTANEOUS
  Administered 2018-12-23: 0 [IU] via SUBCUTANEOUS

## 2018-12-22 MED ORDER — OXYBUTYNIN CHLORIDE 5 MG PO TABS
20.0000 mg | ORAL_TABLET | Freq: Every day | ORAL | Status: DC
  Administered 2018-12-22 – 2018-12-23 (×2): 20 mg via ORAL
  Filled 2018-12-22 (×3): qty 4

## 2018-12-22 MED ORDER — ADULT MULTIVITAMIN W/MINERALS CH
2.0000 | ORAL_TABLET | Freq: Every day | ORAL | Status: DC
  Administered 2018-12-22: 2 via ORAL
  Filled 2018-12-22 (×2): qty 2

## 2018-12-22 MED ORDER — VITAMIN D 25 MCG (1000 UNIT) PO TABS
1000.0000 [IU] | ORAL_TABLET | Freq: Every day | ORAL | Status: DC
  Administered 2018-12-22 (×2): 1000 [IU] via ORAL
  Filled 2018-12-22 (×2): qty 1

## 2018-12-22 MED ORDER — ASPIRIN EC 81 MG PO TBEC
81.0000 mg | DELAYED_RELEASE_TABLET | Freq: Every day | ORAL | Status: DC
  Administered 2018-12-22 – 2018-12-23 (×2): 81 mg via ORAL
  Filled 2018-12-22 (×2): qty 1

## 2018-12-22 MED ORDER — SODIUM CHLORIDE 0.9% FLUSH: 10.0000 mL | INTRAVENOUS | Status: DC | PRN

## 2018-12-22 MED ORDER — INSULIN DETEMIR 100 UNIT/ML ~~LOC~~ SOLN
7.0000 [IU] | Freq: Every day | SUBCUTANEOUS | Status: DC
  Administered 2018-12-22 – 2018-12-23 (×2): 7 [IU] via SUBCUTANEOUS
  Filled 2018-12-22 (×2): qty 0.07

## 2018-12-22 MED ORDER — APIXABAN 5 MG PO TABS
5.0000 mg | ORAL_TABLET | Freq: Two times a day (BID) | ORAL | Status: DC
  Administered 2018-12-22 – 2018-12-23 (×4): 5 mg via ORAL
  Filled 2018-12-22 (×5): qty 1

## 2018-12-22 MED ORDER — MORPHINE SULFATE (PF) 2 MG/ML IV SOLN
2.0000 mg | INTRAVENOUS | Status: DC | PRN
  Administered 2018-12-22: 2 mg via INTRAVENOUS
  Filled 2018-12-22: qty 1

## 2018-12-22 MED ORDER — NITROGLYCERIN 0.4 MG SL SUBL: 0.4000 mg | SUBLINGUAL_TABLET | SUBLINGUAL | Status: DC | PRN

## 2018-12-22 MED ORDER — HYDRALAZINE HCL 20 MG/ML IJ SOLN: 5.0000 mg | INTRAMUSCULAR | Status: DC | PRN

## 2018-12-22 MED ORDER — SODIUM CHLORIDE 0.9 % IV SOLN
INTRAVENOUS | Status: DC
  Administered 2018-12-22 (×2): via INTRAVENOUS

## 2018-12-22 NOTE — H&P (Signed)
History and Physical    Cassandra Allen GDJ:[id number] DOB: [birth year] DOA: 12/21/2018  Referring MD/NP/PA:   PCP: [name], MD   Patient coming from:  The patient is coming from home.  At baseline, pt is independent for most of ADL.        Chief Complaint: Chest pain  HPI: Cassandra Allen is a 73 y.o. female with medical history significant of hypertension, hyperlipidemia, diabetes mellitus, metastasized left colon cancer on chemotherapy, GI bleeding, anemia, atrial fibrillation on Eliquis, CKD-2, who presents with chest pain.  Patient states that she had 3 episodes of chest pain last night, each time lasted shortly for about 20 minutes and then resolved spontaneously.  This afternoon at about 7:30, PM she had another episode of chest pain, which lasted for about 25 minutes, then resolved spontaneously.  The chest pain was located in the substernal area, 5 out of 10 in severity, pounding feeling, radiating to the right arm.  It was associated with mild shortness of breath, which has also resolved currently.  No cough, fever or chills.  Denies any nausea, vomiting, diarrhea, abdominal pain, symptoms of UTI or unilateral weakness.  Of note, patient is taking Eliquis for atrial fibrillation, she state that did not miss any dose.  Last dose was this morning.  ED Course: pt was found to have negative troponin, WBC 2.6, worsening renal function, temperature 99, tachycardia, tachypnea, oxygen saturation 93 to 95% on room air.  Chest x-ray with cardiomegaly and vascular congestion.  Patient is placed on telemetry bed for observation.  Review of Systems:   General: no fevers, chills, no body weight gain, has fatigue HEENT: no blurry vision, hearing changes or sore throat Respiratory: had dyspnea, no coughing, wheezing CV: has chest pain, no palpitations GI: no nausea, vomiting, abdominal pain, diarrhea, constipation GU: no dysuria, burning on urination, increased urinary frequency,  hematuria  Ext: no leg edema Neuro: no unilateral weakness, numbness, or tingling, no vision change or hearing loss Skin: no rash, no skin tear. MSK: No muscle spasm, no deformity, no limitation of range of movement in spin Heme: No easy bruising.  Travel history: No recent long distant travel.  Allergy:  Allergies  Allergen Reactions  . Metformin And Related Nausea And Vomiting  . Prednisone Nausea And Vomiting    Past Medical History:  Diagnosis Date  . Arthritis   . Cancer ([place])   . Diabetes mellitus without complication ([place])   . Hypertension     Past Surgical History:  Procedure Laterality Date  . IR IMAGING GUIDED PORT INSERTION  11/20/2018    Social History:  reports that she has never smoked. She has never used smokeless tobacco. She reports that she does not drink alcohol or use drugs.  Family History:  Family History  Problem Relation Age of Onset  . Heart attack Mother   . Heart attack Father      Prior to Admission medications   Medication Sig Start Date End Date Taking? Authorizing Provider  apixaban (ELIQUIS) 5 MG TABS tablet TAKE 1 TABLET (5 MG TOTAL) BY MOUTH 2 (TWO) TIMES DAILY. Patient taking differently: Take 5 mg by mouth 2 (two) times daily.  09/19/18  Yes [name], NP  beta carotene w/minerals (OCUVITE) tablet Take 1 tablet by mouth daily.    Yes [provider]  cholecalciferol (VITAMIN D) 1000 units tablet Take 1,000 Units by mouth at bedtime.   Yes [provider]  diltiazem (CARDIZEM CD) 120 MG 24  hr capsule Take 1 capsule (120 mg total) by mouth 2 (two) times daily. 11/15/18  Yes [name], NP  diphenoxylate-atropine (LOMOTIL) 2.5-0.025 MG tablet Take 1 to 2 tablets 4 times daily as needed Patient taking differently: Take 1-2 tablets by mouth 4 (four) times daily as needed for diarrhea or loose stools.  09/18/18  Yes Alla Feeling, NP  HYDROcodone-acetaminophen (NORCO) 7.5-325 MG tablet Take 1 tablet by mouth 2  (two) times daily as needed (pain).    Yes [provider]  Insulin Detemir (LEVEMIR FLEXTOUCH) 100 UNIT/ML Pen Inject 10 Units into the skin daily before breakfast.    Yes [provider]  liraglutide (VICTOZA) 18 MG/3ML SOPN Inject 1.8 mg into the skin daily before breakfast.    Yes [provider]  losartan-hydrochlorothiazide (HYZAAR) 100-12.5 MG tablet Take 1 tablet by mouth daily.    Yes [provider]  Multiple Vitamin (MULTIVITAMIN WITH MINERALS) TABS tablet Take 2 tablets by mouth daily at 12 noon.    Yes [provider]  ondansetron (ZOFRAN) 8 MG tablet Take 1 tablet (8 mg total) by mouth 2 (two) times daily as needed for refractory nausea / vomiting. Start on day 3 after chemotherapy. Patient taking differently: Take 8 mg by mouth See admin instructions. Start on day 3 after chemotherapy: take one tablet (8 mg) by mouth twice daily for a couple days. 11/22/18  Yes Truitt Merle, MD  oxybutynin (DITROPAN) 5 MG tablet Take 20 mg by mouth daily.   Yes [provider]  oxybutynin (OXYTROL) 3.9 MG/24HR Place 3.9 mg onto the skin once a week.    Yes [provider]  prochlorperazine (COMPAZINE) 10 MG tablet Take 1 tablet (10 mg total) by mouth every 6 (six) hours as needed (Nausea or vomiting). 11/22/18  Yes Truitt Merle, MD  sertraline (ZOLOFT) 100 MG tablet Take 100 mg by mouth daily.    Yes [provider]  simvastatin (ZOCOR) 20 MG tablet Take 20 mg by mouth at bedtime.    Yes [provider]  lidocaine-prilocaine (EMLA) cream Apply to affected area once Patient not taking: Reported on 12/21/2018 11/22/18   Truitt Merle, MD  lidocaine-prilocaine (EMLA) cream Apply 1 application topically as needed. Patient not taking: Reported on 12/21/2018 11/27/18   Truitt Merle, MD    Physical Exam: Vitals:   12/21/18 2119 12/21/18 2230 12/21/18 2330 12/22/18 0000  BP: 106/70 132/85 109/64 122/69  Pulse: 90 100 94 99  Resp: (!) 24 16 15  18   Temp:      TempSrc:      SpO2: 93% 93% 93% 93%  Weight:      Height:       General: Not in acute distress HEENT:       Eyes: PERRL, EOMI, no scleral icterus.       ENT: No discharge from the ears and nose, no pharynx injection, no tonsillar enlargement.        Neck: No JVD, no bruit, no mass felt. Heme: No neck lymph node enlargement. Cardiac: S1/S2, RRR, No murmurs, No gallops or rubs. Respiratory: No rales, wheezing, rhonchi or rubs. GI: Soft, nondistended, nontender, no rebound pain, no organomegaly, BS present. GU: No hematuria Ext: No pitting leg edema bilaterally. 2+DP/PT pulse bilaterally. Musculoskeletal: No joint deformities, No joint redness or warmth, no limitation of ROM in spin. Skin: No rashes.  Neuro: Alert, oriented X3, cranial nerves II-XII grossly intact, moves all extremities normally.  Psych: Patient is not psychotic,  no suicidal or hemocidal ideation.  Labs on Admission: I have personally reviewed following labs and imaging studies  CBC: Recent Labs  Lab 12/18/18 1029 12/21/18 2055  WBC 6.3 2.6*  NEUTROABS 4.8  --   HGB 8.8* 9.1*  HCT 28.7* 30.7*  MCV 97.3 97.2  PLT 142* 235   Basic Metabolic Panel: Recent Labs  Lab 12/18/18 1029 12/21/18 2055  NA 141 140  K 4.4 4.1  CL 107 102  CO2 28 25  GLUCOSE 165* 149*  BUN 37* 43*  CREATININE 1.04* 1.47*  CALCIUM 8.6* 8.7*   GFR: Estimated Creatinine Clearance: 34.8 mL/min (A) (by C-G formula based on SCr of 1.47 mg/dL (H)). Liver Function Tests: Recent Labs  Lab 12/18/18 1029 12/21/18 2055  AST 60* 14*  ALT 117* 38  ALKPHOS 147* 84  BILITOT 0.5 0.6  PROT 5.8* 5.5*  ALBUMIN 3.2* 3.0*   No results for input(s): LIPASE, AMYLASE in the last 168 hours. No results for input(s): AMMONIA in the last 168 hours. Coagulation Profile: No results for input(s): INR, PROTIME in the last 168 hours. Cardiac Enzymes: No results for input(s): CKTOTAL, CKMB, CKMBINDEX, TROPONINI in the last 168  hours. BNP (last 3 results) No results for input(s): PROBNP in the last 8760 hours. HbA1C: No results for input(s): HGBA1C in the last 72 hours. CBG: No results for input(s): GLUCAP in the last 168 hours. Lipid Profile: No results for input(s): CHOL, HDL, LDLCALC, TRIG, CHOLHDL, LDLDIRECT in the last 72 hours. Thyroid Function Tests: No results for input(s): TSH, T4TOTAL, FREET4, T3FREE, THYROIDAB in the last 72 hours. Anemia Panel: No results for input(s): VITAMINB12, FOLATE, FERRITIN, TIBC, IRON, RETICCTPCT in the last 72 hours. Urine analysis:    Component Value Date/Time   COLORURINE YELLOW 03/25/2015 2317   APPEARANCEUR CLOUDY (A) 03/25/2015 2317   LABSPEC 1.014 03/25/2015 2317   PHURINE 5.0 03/25/2015 2317   GLUCOSEU NEGATIVE 03/25/2015 2317   HGBUR NEGATIVE 03/25/2015 2317   BILIRUBINUR NEGATIVE 03/25/2015 2317   KETONESUR NEGATIVE 03/25/2015 2317   PROTEINUR NEGATIVE 12/18/2018 1353   UROBILINOGEN 0.2 03/25/2015 2317   NITRITE POSITIVE (A) 03/25/2015 2317   LEUKOCYTESUR MODERATE (A) 03/25/2015 2317   Sepsis Labs: @LABRCNTIP (procalcitonin:4,lacticidven:4) )No results found for this or any previous visit (from the past 240 hour(s)).   Radiological Exams on Admission: Dg Chest 2 View  Result Date: 12/21/2018 CLINICAL DATA:  Midline chest pain.  Shortness of breath EXAM: CHEST - 2 VIEW COMPARISON:  Chest radiograph 11/02/2018 FINDINGS: Right anterior chest wall Port-A-Cath is present with tip projecting over the superior vena cava. Monitoring leads overlie the patient. Cardiomegaly. Pulmonary vascular redistribution. Mild interstitial opacities. No pleural effusion. Thoracic spine degenerative changes. IMPRESSION: Cardiomegaly. Pulmonary vascular redistribution and mild interstitial edema. Electronically Signed   By: Lovey Newcomer M.D.   On: 12/21/2018 21:11     EKG: Independently reviewed.  Atrial fibrillation, QTc 476, mild ST depression in V4-V6, Q waves in lead  III.  Assessment/Plan Principal Problem:   Chest pain Active Problems:   Essential (primary) hypertension   Diabetes mellitus without complication (HCC)   Anemia   Cancer of left colon (HCC)   HLD (hyperlipidemia)   Acute renal failure superimposed on stage 2 chronic kidney disease (HCC)   Atrial fibrillation, chronic   Depression   Chest pain: Patient does not have cough, fever chills, unlikely to have pneumonia.  Potential differential diagnosis include PE given active colon cancer and CAD.  Patient is on Eliquis for atrial  fibrillation, did not miss any dose, therefore I have low suspicions for PE.  Initial troponin negative.  EKG showed mild ST depression in V4-V6.  - will place on Tele bed for obs - cycle CE q6 x3 and repeat EKG in the am  - prn Nitroglycerin, Morphine, and aspirin 81 mg dialy, zocor - Risk factor stratification: will check FLP and A1C, UDS - did not order 2d echo--> please reevaluate pt in AM to decide if pt needs 2D echo. - card consult is requested via Epic  HTN:  -Continue home medications: -hold Hyzaar due to worsening renal function -Continue Cardizem -IV hydralazine prn  Diabetes mellitus without complication ([place]): Last A1c 56 on 08/09/18, well controled. Patient is taking metformin at home -will decrease Lantus dose from   -SSI  Anemia: Hgb stable, 8.8 on 12/18/18-->9.1 and WBC 2.6, likely secondary to chemotherapy -f/u CBC  Cancer of left colon [facility]): Patient is followed up with Dr. Burr Medico, currently on chemotherapy, last dose was on Wednesday -f/u with Dr. Burr Medico  HLD (hyperlipidemia): -Zocor  AoCKD-III: Baseline Cre is ~1.0, pt's Cre is 1.47, BUN 43 on admission. Likely due to prerenal secondary to dehydration and continuation of ARB, diuretics - IVF: Normal saline at 75 cc/h; patient received 500 cc normal saline bolus in ED - Follow up renal function by BMP - Hold Hyzaar  Atrial Fibrillation: CHA2DS2-VASc Score is 4, needs oral  anticoagulation. Patient is on Eliquis at home.  Heart rate is 90-110s in ED. -Continue Eliquis and Cardizem  Depression: -Zoloft    DVT ppx: on eliquis Code Status: Full code Family Communication:    Yes, patient's son   at bed side Disposition Plan:  Anticipate discharge back to previous home environment Consults called:  none Admission status: Obs / tele   Date of Service 12/22/2018    [facility]   If 7PM-7AM, please contact night-coverage [email or web address] Password [email or web address] 12/22/2018, 12:44 AM

## 2018-12-22 NOTE — Progress Notes (Signed)
Patient was admitted earlier today.  First troponin was 0.04, and repeat troponin was 0.03.  Patient reported pressure like chest pain, rated as 7 out of 10 at its peak, associated shortness of breath and radiated to the right upper extremity.  Revealed atrial fibrillation with Q waves in 3 and aVF.  Cannot rule out poor R wave progression.  Patient is currently chest pain-free.  Awaiting for cardiology input.  As per H&P done earlier today "Cassandra Allen is a 73 y.o. female with medical history significant of hypertension, hyperlipidemia, diabetes mellitus, metastasized left colon cancer on chemotherapy, GI bleeding, anemia, atrial fibrillation on Eliquis, CKD-2, who presents with chest pain.  Patient states that she had 3 episodes of chest pain last night, each time lasted shortly for about 20 minutes and then resolved spontaneously.  This afternoon at about 7:30, PM she had another episode of chest pain, which lasted for about 25 minutes, then resolved spontaneously.  The chest pain was located in the substernal area, 5 out of 10 in severity, pounding feeling, radiating to the right arm.  It was associated with mild shortness of breath, which has also resolved currently.  No cough, fever or chills.  Denies any nausea, vomiting, diarrhea, abdominal pain, symptoms of UTI or unilateral weakness.  Of note, patient is taking Eliquis for atrial fibrillation, she state that did not miss any dose.  Last dose was this morning.  ED Course: pt was found to have negative troponin, WBC 2.6, worsening renal function, temperature 99, tachycardia, tachypnea, oxygen saturation 93 to 95% on room air.  Chest x-ray with cardiomegaly and vascular congestion.  Patient is placed on telemetry bed for observation".  Further management will depend on hospital course.

## 2018-12-22 NOTE — Consult Note (Addendum)
Cardiology Consultation:   Patient ID: Cassandra Allen MRN: [id number]; DOB: [birth year]  Admit date: 12/21/2018 Date of Consult: 12/22/2018  Primary Care Provider: [name], MD Primary Cardiologist: Thompson Grayer, MD  Primary Electrophysiologist:  Thompson Grayer, MD   Patient Profile:   Cassandra Allen is a 73 y.o. female with metastatic colon cancer on chemotherapy, h/o permanent atrial fibrillation followed in the [facility], chronic anticoagulation w/ Xarelto, anemia, CKD, HTN and DM who is being seen today for the evaluation of chest pain and dyspnea at the request of Dr. Marthenia Rolling, Internal Medicine.   History of Present Illness:   Cassandra Allen is a 73 y.o. female with metastatic colon cancer on chemotherapy, h/o permanent atrial fibrillation, followed in the [facility], chronic anticoagulation w/ Xarelto, anemia, CKD, HTN and DM who is being seen today for the evaluation of chest pain dyspnea at the request of Dr. Marthenia Rolling, Internal Medicine.   Her colon cancer was recently diagnosed in Dec 2019. She had put off having a colonoscopy for years. She is currently getting chemo. No surgery yet. Plan is for PET scan in a few weeks.   She presented to the [facility] ED on 12/21/18 w/ CC of chest pain. Symptoms started 2 days ago, off and on. Substernal chest pressure. Yesterday, while standing at her kitchen sink, she had a severe episode radiating to her right arm. She was short of breath. She called EMS. When they arrived, they started supplemental O2 and ASA and pain and dyspnea resolved. She was transported to the ED.   Hgb 9.1 on admit c/w baseline. She has an AKI. SCr is 1.47. baseline ~1.0. initial troponin mildly abnormal at 0.04 but repeat 2nd troponin negative. BP has been normotensive. Admit EKG showed Afib HR 115 bpm. CXR showed cardiomegaly. pulmonary vascular redistribution and mild interstitial edema. No BNP ordered. She has crackles on exam and 1+ bilateral LEE. Has not  received lasix. She is CP free. No recurrence since yesterday.   Past Medical History:  Diagnosis Date  . Arthritis   . Cancer ([place])   . Diabetes mellitus without complication ([place])   . Hypertension     Past Surgical History:  Procedure Laterality Date  . IR IMAGING GUIDED PORT INSERTION  11/20/2018     Home Medications:  Prior to Admission medications   Medication Sig Start Date End Date Taking? Authorizing Provider  apixaban (ELIQUIS) 5 MG TABS tablet TAKE 1 TABLET (5 MG TOTAL) BY MOUTH 2 (TWO) TIMES DAILY. Patient taking differently: Take 5 mg by mouth 2 (two) times daily.  09/19/18  Yes [name], NP  beta carotene w/minerals (OCUVITE) tablet Take 1 tablet by mouth daily.    Yes [provider]  cholecalciferol (VITAMIN D) 1000 units tablet Take 1,000 Units by mouth at bedtime.   Yes [provider]  diltiazem (CARDIZEM CD) 120 MG 24 hr capsule Take 1 capsule (120 mg total) by mouth 2 (two) times daily. 11/15/18  Yes [name], NP  diphenoxylate-atropine (LOMOTIL) 2.5-0.025 MG tablet Take 1 to 2 tablets 4 times daily as needed Patient taking differently: Take 1-2 tablets by mouth 4 (four) times daily as needed for diarrhea or loose stools.  09/18/18  Yes Alla Feeling, NP  HYDROcodone-acetaminophen (NORCO) 7.5-325 MG tablet Take 1 tablet by mouth 2 (two) times daily as needed (pain).    Yes [provider]  Insulin Detemir (LEVEMIR FLEXTOUCH) 100 UNIT/ML Pen Inject 10 Units into the skin daily  before breakfast.    Yes [provider]  liraglutide (VICTOZA) 18 MG/3ML SOPN Inject 1.8 mg into the skin daily before breakfast.    Yes [provider]  losartan-hydrochlorothiazide (HYZAAR) 100-12.5 MG tablet Take 1 tablet by mouth daily.    Yes [provider]  Multiple Vitamin (MULTIVITAMIN WITH MINERALS) TABS tablet Take 2 tablets by mouth daily at 12 noon.    Yes [provider]  ondansetron (ZOFRAN) 8 MG tablet  Take 1 tablet (8 mg total) by mouth 2 (two) times daily as needed for refractory nausea / vomiting. Start on day 3 after chemotherapy. Patient taking differently: Take 8 mg by mouth See admin instructions. Start on day 3 after chemotherapy: take one tablet (8 mg) by mouth twice daily for a couple days. 11/22/18  Yes Truitt Merle, MD  oxybutynin (DITROPAN) 5 MG tablet Take 20 mg by mouth daily.   Yes [provider]  oxybutynin (OXYTROL) 3.9 MG/24HR Place 3.9 mg onto the skin once a week.    Yes [provider]  prochlorperazine (COMPAZINE) 10 MG tablet Take 1 tablet (10 mg total) by mouth every 6 (six) hours as needed (Nausea or vomiting). 11/22/18  Yes Truitt Merle, MD  sertraline (ZOLOFT) 100 MG tablet Take 100 mg by mouth daily.    Yes [provider]  simvastatin (ZOCOR) 20 MG tablet Take 20 mg by mouth at bedtime.    Yes [provider]  lidocaine-prilocaine (EMLA) cream Apply to affected area once Patient not taking: Reported on 12/21/2018 11/22/18   Truitt Merle, MD  lidocaine-prilocaine (EMLA) cream Apply 1 application topically as needed. Patient not taking: Reported on 12/21/2018 11/27/18   Truitt Merle, MD    Inpatient Medications: Scheduled Meds: . apixaban  5 mg Oral BID  . aspirin EC  81 mg Oral Daily  . cholecalciferol  1,000 Units Oral QHS  . diltiazem  120 mg Oral BID  . insulin aspart  0-9 Units Subcutaneous TID WC  . insulin detemir  7 Units Subcutaneous QAC breakfast  . multivitamin  1 tablet Oral Daily  . multivitamin with minerals  2 tablet Oral Q1200  . oxybutynin  20 mg Oral Daily  . sertraline  100 mg Oral Daily  . simvastatin  20 mg Oral QHS  . sodium chloride flush  3 mL Intravenous Once   Continuous Infusions: . sodium chloride 75 mL/hr at 12/22/18 0530   PRN Meds: acetaminophen, diphenoxylate-atropine, hydrALAZINE, HYDROcodone-acetaminophen, morphine injection, nitroGLYCERIN, ondansetron (ZOFRAN) IV, sodium chloride flush  Allergies:      Allergies  Allergen Reactions  . Metformin And Related Nausea And Vomiting  . Prednisone Nausea And Vomiting    Social History:   Social History   Socioeconomic History  . Marital status: Divorced    Spouse name: Not on file  . Number of children: 2  . Years of education: Not on file  . Highest education level: Not on file  Occupational History  . Not on file  Social Needs  . Financial resource strain: Not on file  . Food insecurity:    Worry: Not on file    Inability: Not on file  . Transportation needs:    Medical: Not on file    Non-medical: Not on file  Tobacco Use  . Smoking status: Never Smoker  . Smokeless tobacco: Never Used  Substance and Sexual Activity  . Alcohol use: No  . Drug use: No  . Sexual activity: Not on file  Lifestyle  .  Physical activity:    Days per week: Not on file    Minutes per session: Not on file  . Stress: Not on file  Relationships  . Social connections:    Talks on phone: Not on file    Gets together: Not on file    Attends religious service: Not on file    Active member of club or organization: Not on file    Attends meetings of clubs or organizations: Not on file    Relationship status: Not on file  . Intimate partner violence:    Fear of current or ex partner: Not on file    Emotionally abused: Not on file    Physically abused: Not on file    Forced sexual activity: Not on file  Other Topics Concern  . Not on file  Social History Narrative  . Not on file    Family History:   Family History  Problem Relation Age of Onset  . Heart attack Mother   . Heart attack Father      ROS:  Please see the history of present illness.   All other ROS reviewed and negative.     Physical Exam/Data:   Vitals:   12/22/18 0030 12/22/18 0130 12/22/18 0241 12/22/18 0921  BP: 127/71 (!) 131/59 139/67 136/68  Pulse: (!) 46 (!) 163 (!) 104   Resp: 19 18 20    Temp:   99.2 F (37.3 C)   TempSrc:   Oral   SpO2: 95% 94% 99%    Weight:   81.1 kg   Height:   5\' 3"  (1.6 m)     Intake/Output Summary (Last 24 hours) at 12/22/2018 1215 Last data filed at 12/22/2018 0530 Gross per 24 hour  Intake 730.09 ml  Output 600 ml  Net 130.09 ml   Last 3 Weights 12/22/2018 12/21/2018 12/18/2018  Weight (lbs) 178 lb 14.4 oz 178 lb 9.2 oz 180 lb 8 oz  Weight (kg) 81.149 kg 81 kg 81.874 kg     Body mass index is 31.69 kg/m.  General:  Elderly WF in no acute distress HEENT: normal Lymph: no adenopathy Neck: no JVD Endocrine:  No thryomegaly Vascular: No carotid bruits; FA pulses 2+ bilaterally without bruits  Cardiac:  irregularly irregular, regular rate Lungs:  Bibasilar crackles Abd: soft, nontender, no hepatomegaly  Ext: 1+ bilateral LEE Musculoskeletal:  No deformities, BUE and BLE strength normal and equal Skin: warm and dry  Neuro:  CNs 2-12 intact, no focal abnormalities noted Psych:  Normal affect   EKG:  The EKG was personally reviewed and demonstrates:  afib 115 bpm  Telemetry:  Telemetry was personally reviewed and demonstrates:  Atrial fibrillation   Relevant CV Studies: 2D echo 2017 Study Conclusions  - Left ventricle: The cavity size was normal. There was mild focal   basal hypertrophy of the septum. Systolic function was vigorous.   The estimated ejection fraction was in the range of 65% to 70%.   Wall motion was normal; there were no regional wall motion   abnormalities. Doppler parameters are consistent with abnormal   left ventricular relaxation (grade 1 diastolic dysfunction).   There was no evidence of elevated ventricular filling pressure by   Doppler parameters. - Aortic valve: There was no regurgitation. - Mitral valve: There was trivial regurgitation. - Left atrium: The atrium was mildly dilated. - Right ventricle: Systolic function was normal. - Right atrium: The atrium was normal in size. - Tricuspid valve: There was mild regurgitation. -  Pulmonic valve: Structurally normal valve. -  Pulmonary arteries: Systolic pressure was within the normal   range. - Inferior vena cava: The vessel was normal in size. - Pericardium, extracardiac: There was no pericardial effusion.   Laboratory Data:  Chemistry Recent Labs  Lab 12/18/18 1029 12/21/18 2055  NA 141 140  K 4.4 4.1  CL 107 102  CO2 28 25  GLUCOSE 165* 149*  BUN 37* 43*  CREATININE 1.04* 1.47*  CALCIUM 8.6* 8.7*  GFRNONAA 54* 35*  GFRAA >60 41*  ANIONGAP 6 13    Recent Labs  Lab 12/18/18 1029 12/21/18 2055  PROT 5.8* 5.5*  ALBUMIN 3.2* 3.0*  AST 60* 14*  ALT 117* 38  ALKPHOS 147* 84  BILITOT 0.5 0.6   Hematology Recent Labs  Lab 12/18/18 1029 12/21/18 2055  WBC 6.3 2.6*  RBC 2.95* 3.16*  HGB 8.8* 9.1*  HCT 28.7* 30.7*  MCV 97.3 97.2  MCH 29.8 28.8  MCHC 30.7 29.6*  RDW 16.2* 15.2  PLT 142* 202   Cardiac Enzymes Recent Labs  Lab 12/22/18 0113 12/22/18 0639  TROPONINI 0.04* <0.03    Recent Labs  Lab 12/21/18 2056 12/22/18 0115  TROPIPOC 0.02 0.05    BNPNo results for input(s): BNP, PROBNP in the last 168 hours.  DDimer No results for input(s): DDIMER in the last 168 hours.  Radiology/Studies:  Dg Chest 2 View  Result Date: 12/21/2018 CLINICAL DATA:  Midline chest pain.  Shortness of breath EXAM: CHEST - 2 VIEW COMPARISON:  Chest radiograph 11/02/2018 FINDINGS: Right anterior chest wall Port-A-Cath is present with tip projecting over the superior vena cava. Monitoring leads overlie the patient. Cardiomegaly. Pulmonary vascular redistribution. Mild interstitial opacities. No pleural effusion. Thoracic spine degenerative changes. IMPRESSION: Cardiomegaly. Pulmonary vascular redistribution and mild interstitial edema. Electronically Signed   By: Lovey Newcomer M.D.   On: 12/21/2018 21:11    Assessment and Plan:   Cassandra Allen is a 73 y.o. female with metastatic colon cancer on chemotherapy, h/o permanent atrial fibrillation, followed in the [facility]ib Clinic, chronic anticoagulation  w/ Xarelto, anemia, CKD, HTN and DM who is being seen today for the evaluation of chest pain dyspnea at the request of Dr. Marthenia Rolling, Internal Medicine.  1. Acute Diastolic CHF: prior echo in 2017 showed normal LVEF and G1DD. Came in w/ CP and dyspnea. Symptoms improved w/ supplemental O2 and ASA. CXR shows cardiomegaly and pulmonary vascular redistribution and mild interstitial edema. Crackles noted on exam and she has 1+ bilateral LEE. Will check a BNP today and will give a dose of IV lasix. She is lasix naive. Will give 20 IV x 1. Reassess in the AM. Strict I/Os and daily weights.  Repeat echo. F/u BMP in the AM to reassess renal function.   2. Chest Pain: may be related to acute diastolic CHF. She denies any recurrence since yesterday afternoon. Initial troponin in the ED was 0.04 however repeat troponin negative. Not c/w ACS. Likely demand ischemia from CHF. Admit EKG showed chronic afib, rate 115 bpm. Recommend diuretics for acute diastolic CHF. Will update echo. Further recs TBD based on results.   3. Metastatic Colon [place]: diagnosed 10/2018. On chemo.   4. AKI: SCr 1.4. baseline 1.0. needs diuretics for acute CHF. W/u BMP in the AM.   For questions or updates, please contact [place] Please consult [email or web address] for contact info under     Signed, Lyda Jester, PA-C  12/22/2018 12:15 PM ---------------------------------------------------------------------------------------------   History and all data  above reviewed.  Patient examined.  I agree with the findings as above.  Cassandra Allen is a pleasant 73 yo female receiving chemotherapy for metastatic colon cancer (FOLFOX), who presents with likely acute diastolic HF. She had acute chest pain but has ruled out for MI. CXR and clinical exam suggest mild volume overload.   Constitutional: No acute distress Eyes: pupils equally round and reactive to light, sclera non-icteric, normal conjunctiva and lids ENMT: normal  dentition, moist mucous membranes Cardiovascular: regular rhythm, normal rate, no murmurs. S1 and S2 normal. Radial pulses normal bilaterally. jugular venous distention to lower 1/3 of neck.  Respiratory: bilateral crackles to the mid lung GI : normal bowel sounds, soft and nontender. No distention.   MSK: extremities warm, well perfused. 1+ bilateral edema.  NEURO: grossly nonfocal exam, moves all extremities. PSYCH: alert and oriented x 3, normal mood and affect.   All available labs, radiology testing, previous records reviewed. 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  Mildly elevated troponin which is flat, and could be secondary to heart failure.  We will provide Lasix 20 mg IV as she is Lasix nave and monitor for response.  BNP is pending.  Could consider outpatient stress test for evaluation for ischemia, It can be performed at close outpatient follow-up.  In the setting of new heart failure symptoms and recent initiation of chemotherapy, we will update her echocardiogram to ensure that there is no component of systolic dysfunction to address with optimal medical therapy or ischemia investigation.   Length of Stay:  LOS: 0 days   Elouise Munroe, MD [facility] 1:38 PM  12/22/2018

## 2018-12-22 NOTE — Progress Notes (Signed)
IV team consulted for port-a-cath access per pt request. HS meds given upon arrival to unit. Pt refuses to receive IVF for hydration and/or IV Morphine for pain until port-a-cath is accessed, although she also has a peripheral IV. Will continue to monitor.

## 2018-12-22 NOTE — Evaluation (Signed)
Physical Therapy Evaluation Patient Details Name: Cassandra Allen MRN: [id number] DOB: Sep 25, 1946 Today's Date: 12/22/2018   History of Present Illness  73 y.o. female with medical history significant of hypertension, hyperlipidemia, diabetes mellitus, metastasized left colon cancer on chemotherapy, GI bleeding, anemia, atrial fibrillation on Eliquis, CKD-2, who presents with chest pain.     Clinical Impression  Pt admitted with above diagnosis. Pt currently with functional limitations due to the deficits listed below (see PT Problem List). PTA pt lived at home alone, modified independent with mobility using cane vs rollator. On eval, she required supervision/min guard assist transfers and min guard assist ambulation 25 feet with RW. Gait distance limited by L knee pain. Ice placed on L knee at end of session. Pt will benefit from skilled PT to increase their independence and safety with mobility to allow discharge to the venue listed below.       Follow Up Recommendations Home health PT;Supervision - Intermittent    Equipment Recommendations  None recommended by PT    Recommendations for Other Services       Precautions / Restrictions Precautions Precautions: Fall Precaution Comments: Pt reports multi falls at home.      Mobility  Bed Mobility Overal bed mobility: Modified Independent                Transfers Overall transfer level: Needs assistance Equipment used: Rolling walker (2 wheeled) Transfers: Sit to/from [facility] Sit to Stand: Supervision Stand pivot transfers: Min guard       General transfer comment: assist for safety. Pt demo safe technique.  Ambulation/Gait Ambulation/Gait assistance: Min guard Gait Distance (Feet): 25 Feet Assistive device: Rolling walker (2 wheeled) Gait Pattern/deviations: Step-through pattern;Decreased stride length;Antalgic Gait velocity: decreased Gait velocity interpretation: <1.31 ft/sec, indicative  of household ambulator General Gait Details: distance limited by L knee pain.  Stairs            Wheelchair Mobility    Modified Rankin (Stroke Patients Only)       Balance Overall balance assessment: Needs assistance Sitting-balance support: No upper extremity supported;Feet supported Sitting balance-Leahy Scale: Good     Standing balance support: Bilateral upper extremity supported;During functional activity Standing balance-Leahy Scale: Poor Standing balance comment: reliant on RW                             Pertinent Vitals/Pain Pain Assessment: 0-10 Pain Score: 7  Pain Location: L knee Pain Descriptors / Indicators: Aching;Sore Pain Intervention(s): Limited activity within patient's tolerance;Repositioned;Ice applied    Home Living Family/patient expects to be discharged to:: Private residence Living Arrangements: Alone Available Help at Discharge: Family;Available PRN/intermittently Type of Home: Apartment Home Access: Level entry     Home Layout: One level Home Equipment: Cane - single point;Walker - 4 wheels      Prior Function Level of Independence: Independent with assistive device(s)         Comments: cane in apt, rollator in community. Drives. Does her own grocery shopping.     Hand Dominance        Extremity/Trunk Assessment   Upper Extremity Assessment Upper Extremity Assessment: Overall WFL for tasks assessed    Lower Extremity Assessment Lower Extremity Assessment: Overall WFL for tasks assessed    Cervical / Trunk Assessment Cervical / Trunk Assessment: Normal  Communication   Communication: No difficulties  Cognition Arousal/Alertness: Awake/alert Behavior During Therapy: WFL for tasks assessed/performed Overall Cognitive Status: Within Functional  Limits for tasks assessed                                        General Comments      Exercises     Assessment/Plan    PT Assessment  Patient needs continued PT services  PT Problem List Decreased balance;Pain;Decreased mobility;Decreased activity tolerance       PT Treatment Interventions Functional mobility training;Balance training;Patient/family education;Gait training;Therapeutic activities;Therapeutic exercise    PT Goals (Current goals can be found in the Care Plan section)  Acute Rehab PT Goals Patient Stated Goal: decrease L knee pain PT Goal Formulation: With patient Time For Goal Achievement: 01/05/19 Potential to Achieve Goals: Good    Frequency Min 3X/week   Barriers to discharge        Co-evaluation               AM-PAC PT "6 Clicks" Mobility  Outcome Measure Help needed turning from your back to your side while in a flat bed without using bedrails?: None Help needed moving from lying on your back to sitting on the side of a flat bed without using bedrails?: None Help needed moving to and from a bed to a chair (including a wheelchair)?: A Little Help needed standing up from a chair using your arms (e.g., wheelchair or bedside chair)?: A Little Help needed to walk in hospital room?: A Little Help needed climbing 3-5 steps with a railing? : A Lot 6 Click Score: 19    End of Session Equipment Utilized During Treatment: Gait belt Activity Tolerance: Patient limited by pain Patient left: in chair;with call bell/phone within reach Nurse Communication: Mobility status PT Visit Diagnosis: Difficulty in walking, not elsewhere classified (R26.2);Pain Pain - Right/Left: Left Pain - part of body: Knee    Time: 7035-0093 PT Time Calculation (min) (ACUTE ONLY): 19 min   Charges:   PT Evaluation $PT Eval Low Complexity: 1 Low          Lorrin Goodell, PT  Office # [phone] Pager [phone]   Lorriane Shire 12/22/2018, 9:41 AM

## 2018-12-23 ENCOUNTER — Observation Stay (HOSPITAL_BASED_OUTPATIENT_CLINIC_OR_DEPARTMENT_OTHER): Payer: Medicare HMO

## 2018-12-23 ENCOUNTER — Other Ambulatory Visit: Payer: Self-pay

## 2018-12-23 DIAGNOSIS — I361 Nonrheumatic tricuspid (valve) insufficiency: Secondary | ICD-10-CM | POA: Diagnosis not present

## 2018-12-23 DIAGNOSIS — R1084 Generalized abdominal pain: Secondary | ICD-10-CM | POA: Diagnosis not present

## 2018-12-23 DIAGNOSIS — R5381 Other malaise: Secondary | ICD-10-CM | POA: Diagnosis not present

## 2018-12-23 DIAGNOSIS — Z483 Aftercare following surgery for neoplasm: Secondary | ICD-10-CM | POA: Diagnosis not present

## 2018-12-23 DIAGNOSIS — Z7901 Long term (current) use of anticoagulants: Secondary | ICD-10-CM | POA: Diagnosis not present

## 2018-12-23 DIAGNOSIS — E860 Dehydration: Secondary | ICD-10-CM | POA: Diagnosis not present

## 2018-12-23 DIAGNOSIS — E785 Hyperlipidemia, unspecified: Secondary | ICD-10-CM | POA: Diagnosis not present

## 2018-12-23 DIAGNOSIS — R000 Tachycardia, unspecified: Secondary | ICD-10-CM | POA: Diagnosis not present

## 2018-12-23 DIAGNOSIS — K567 Ileus, unspecified: Secondary | ICD-10-CM | POA: Diagnosis not present

## 2018-12-23 DIAGNOSIS — R079 Chest pain, unspecified: Secondary | ICD-10-CM | POA: Diagnosis not present

## 2018-12-23 DIAGNOSIS — D638 Anemia in other chronic diseases classified elsewhere: Secondary | ICD-10-CM

## 2018-12-23 DIAGNOSIS — E46 Unspecified protein-calorie malnutrition: Secondary | ICD-10-CM | POA: Diagnosis not present

## 2018-12-23 DIAGNOSIS — M6281 Muscle weakness (generalized): Secondary | ICD-10-CM | POA: Diagnosis not present

## 2018-12-23 DIAGNOSIS — R41841 Cognitive communication deficit: Secondary | ICD-10-CM | POA: Diagnosis not present

## 2018-12-23 DIAGNOSIS — N183 Chronic kidney disease, stage 3 (moderate): Secondary | ICD-10-CM | POA: Diagnosis present

## 2018-12-23 DIAGNOSIS — Z86010 Personal history of colonic polyps: Secondary | ICD-10-CM | POA: Diagnosis not present

## 2018-12-23 DIAGNOSIS — K559 Vascular disorder of intestine, unspecified: Secondary | ICD-10-CM | POA: Diagnosis not present

## 2018-12-23 DIAGNOSIS — E279 Disorder of adrenal gland, unspecified: Secondary | ICD-10-CM | POA: Diagnosis not present

## 2018-12-23 DIAGNOSIS — C187 Malignant neoplasm of sigmoid colon: Secondary | ICD-10-CM | POA: Diagnosis present

## 2018-12-23 DIAGNOSIS — I5033 Acute on chronic diastolic (congestive) heart failure: Secondary | ICD-10-CM | POA: Diagnosis not present

## 2018-12-23 DIAGNOSIS — K8020 Calculus of gallbladder without cholecystitis without obstruction: Secondary | ICD-10-CM | POA: Diagnosis not present

## 2018-12-23 DIAGNOSIS — Z9049 Acquired absence of other specified parts of digestive tract: Secondary | ICD-10-CM | POA: Diagnosis not present

## 2018-12-23 DIAGNOSIS — K2950 Unspecified chronic gastritis without bleeding: Secondary | ICD-10-CM | POA: Diagnosis present

## 2018-12-23 DIAGNOSIS — E870 Hyperosmolality and hypernatremia: Secondary | ICD-10-CM | POA: Diagnosis not present

## 2018-12-23 DIAGNOSIS — D500 Iron deficiency anemia secondary to blood loss (chronic): Secondary | ICD-10-CM | POA: Diagnosis not present

## 2018-12-23 DIAGNOSIS — C186 Malignant neoplasm of descending colon: Secondary | ICD-10-CM | POA: Diagnosis not present

## 2018-12-23 DIAGNOSIS — I959 Hypotension, unspecified: Secondary | ICD-10-CM | POA: Diagnosis not present

## 2018-12-23 DIAGNOSIS — E11649 Type 2 diabetes mellitus with hypoglycemia without coma: Secondary | ICD-10-CM | POA: Diagnosis not present

## 2018-12-23 DIAGNOSIS — R0989 Other specified symptoms and signs involving the circulatory and respiratory systems: Secondary | ICD-10-CM | POA: Diagnosis not present

## 2018-12-23 DIAGNOSIS — I9581 Postprocedural hypotension: Secondary | ICD-10-CM | POA: Diagnosis not present

## 2018-12-23 DIAGNOSIS — N179 Acute kidney failure, unspecified: Secondary | ICD-10-CM | POA: Diagnosis not present

## 2018-12-23 DIAGNOSIS — I4891 Unspecified atrial fibrillation: Secondary | ICD-10-CM | POA: Diagnosis not present

## 2018-12-23 DIAGNOSIS — D509 Iron deficiency anemia, unspecified: Secondary | ICD-10-CM | POA: Diagnosis not present

## 2018-12-23 DIAGNOSIS — I5031 Acute diastolic (congestive) heart failure: Secondary | ICD-10-CM | POA: Diagnosis not present

## 2018-12-23 DIAGNOSIS — D631 Anemia in chronic kidney disease: Secondary | ICD-10-CM | POA: Diagnosis present

## 2018-12-23 DIAGNOSIS — E876 Hypokalemia: Secondary | ICD-10-CM | POA: Diagnosis not present

## 2018-12-23 DIAGNOSIS — K5669 Other partial intestinal obstruction: Secondary | ICD-10-CM | POA: Diagnosis not present

## 2018-12-23 DIAGNOSIS — C787 Secondary malignant neoplasm of liver and intrahepatic bile duct: Secondary | ICD-10-CM | POA: Diagnosis present

## 2018-12-23 DIAGNOSIS — C188 Malignant neoplasm of overlapping sites of colon: Secondary | ICD-10-CM | POA: Diagnosis not present

## 2018-12-23 DIAGNOSIS — C189 Malignant neoplasm of colon, unspecified: Secondary | ICD-10-CM | POA: Diagnosis not present

## 2018-12-23 DIAGNOSIS — I130 Hypertensive heart and chronic kidney disease with heart failure and stage 1 through stage 4 chronic kidney disease, or unspecified chronic kidney disease: Secondary | ICD-10-CM | POA: Diagnosis not present

## 2018-12-23 DIAGNOSIS — C799 Secondary malignant neoplasm of unspecified site: Secondary | ICD-10-CM | POA: Diagnosis not present

## 2018-12-23 DIAGNOSIS — Z4682 Encounter for fitting and adjustment of non-vascular catheter: Secondary | ICD-10-CM | POA: Diagnosis not present

## 2018-12-23 DIAGNOSIS — K659 Peritonitis, unspecified: Secondary | ICD-10-CM | POA: Diagnosis not present

## 2018-12-23 DIAGNOSIS — I110 Hypertensive heart disease with heart failure: Secondary | ICD-10-CM | POA: Diagnosis not present

## 2018-12-23 DIAGNOSIS — N189 Chronic kidney disease, unspecified: Secondary | ICD-10-CM | POA: Diagnosis not present

## 2018-12-23 DIAGNOSIS — Z433 Encounter for attention to colostomy: Secondary | ICD-10-CM | POA: Diagnosis not present

## 2018-12-23 DIAGNOSIS — R0902 Hypoxemia: Secondary | ICD-10-CM | POA: Diagnosis not present

## 2018-12-23 DIAGNOSIS — K56699 Other intestinal obstruction unspecified as to partial versus complete obstruction: Secondary | ICD-10-CM | POA: Diagnosis not present

## 2018-12-23 DIAGNOSIS — K8070 Calculus of gallbladder and bile duct without cholecystitis without obstruction: Secondary | ICD-10-CM | POA: Diagnosis present

## 2018-12-23 DIAGNOSIS — C185 Malignant neoplasm of splenic flexure: Secondary | ICD-10-CM | POA: Diagnosis present

## 2018-12-23 DIAGNOSIS — I482 Chronic atrial fibrillation, unspecified: Secondary | ICD-10-CM | POA: Diagnosis present

## 2018-12-23 DIAGNOSIS — D696 Thrombocytopenia, unspecified: Secondary | ICD-10-CM | POA: Diagnosis not present

## 2018-12-23 DIAGNOSIS — K56609 Unspecified intestinal obstruction, unspecified as to partial versus complete obstruction: Secondary | ICD-10-CM | POA: Diagnosis not present

## 2018-12-23 DIAGNOSIS — I10 Essential (primary) hypertension: Secondary | ICD-10-CM | POA: Diagnosis not present

## 2018-12-23 DIAGNOSIS — R262 Difficulty in walking, not elsewhere classified: Secondary | ICD-10-CM | POA: Diagnosis not present

## 2018-12-23 DIAGNOSIS — D62 Acute posthemorrhagic anemia: Secondary | ICD-10-CM | POA: Diagnosis not present

## 2018-12-23 DIAGNOSIS — N390 Urinary tract infection, site not specified: Secondary | ICD-10-CM | POA: Diagnosis not present

## 2018-12-23 DIAGNOSIS — E1165 Type 2 diabetes mellitus with hyperglycemia: Secondary | ICD-10-CM | POA: Diagnosis not present

## 2018-12-23 DIAGNOSIS — E119 Type 2 diabetes mellitus without complications: Secondary | ICD-10-CM | POA: Diagnosis not present

## 2018-12-23 DIAGNOSIS — E1122 Type 2 diabetes mellitus with diabetic chronic kidney disease: Secondary | ICD-10-CM | POA: Diagnosis present

## 2018-12-23 DIAGNOSIS — I5030 Unspecified diastolic (congestive) heart failure: Secondary | ICD-10-CM | POA: Diagnosis not present

## 2018-12-23 DIAGNOSIS — R1031 Right lower quadrant pain: Secondary | ICD-10-CM | POA: Diagnosis present

## 2018-12-23 DIAGNOSIS — Z794 Long term (current) use of insulin: Secondary | ICD-10-CM | POA: Diagnosis not present

## 2018-12-23 LAB — BASIC METABOLIC PANEL
Anion gap: 8 (ref 5–15)
BUN: 38 mg/dL — ABNORMAL HIGH (ref 8–23)
CO2: 25 mmol/L (ref 22–32)
Calcium: 8.1 mg/dL — ABNORMAL LOW (ref 8.9–10.3)
Chloride: 108 mmol/L (ref 98–111)
Creatinine, Ser: 1.34 mg/dL — ABNORMAL HIGH (ref 0.44–1.00)
GFR, EST AFRICAN AMERICAN: 46 mL/min — AB (ref >60)
GFR, EST NON AFRICAN AMERICAN: 39 mL/min — AB (ref >60)
Glucose, Bld: 126 mg/dL — ABNORMAL HIGH (ref 70–99)
Potassium: 4.6 mmol/L (ref 3.5–5.1)
Sodium: 141 mmol/L (ref 135–145)

## 2018-12-23 LAB — GLUCOSE, CAPILLARY
GLUCOSE-CAPILLARY: 191 mg/dL — AB (ref 70–99)
Glucose-Capillary: 133 mg/dL — ABNORMAL HIGH (ref 70–99)

## 2018-12-23 LAB — ECHOCARDIOGRAM COMPLETE
Height: 63 in
Weight: 2819.2 oz

## 2018-12-23 MED ORDER — HEPARIN SOD (PORK) LOCK FLUSH 100 UNIT/ML IV SOLN
500.0000 [IU] | INTRAVENOUS | Status: AC | PRN
  Administered 2018-12-23: 500 [IU]

## 2018-12-23 MED ORDER — INSULIN DETEMIR 100 UNIT/ML ~~LOC~~ SOLN: 7.0000 [IU] | Freq: Every day | SUBCUTANEOUS | 11 refills | Status: DC

## 2018-12-23 MED ORDER — SIMETHICONE 80 MG PO CHEW
80.0000 mg | CHEWABLE_TABLET | Freq: Four times a day (QID) | ORAL | Status: DC | PRN
  Administered 2018-12-23: 80 mg via ORAL
  Filled 2018-12-23: qty 1

## 2018-12-23 MED ORDER — FUROSEMIDE 20 MG PO TABS: ORAL_TABLET | ORAL | 11 refills | Status: DC

## 2018-12-23 NOTE — Consult Note (Signed)
   [facility] Inpatient Consult   12/23/2018  Cassandra Allen [facility] 11/10/1946 [id number]   Patient is list as "active"  with [facility] for medication management services.  Patient had been outreached by Day team in the distant past.   Met with the patient at the bedside for ongoing [facility] needs. Patient is showing as extreme high risk scores for unplanned readmissions.  Patient states she is in pain with her stomach.  She is currently to return home.  She denies any needs for medication, transportation, or community resources.  Patient did accept a brochure and 24 hour nurse advise line magnet and was encouraged to call.  Patient confirms her [facility] and her primary care provider is Dr. [name].   She is actively see her oncologist for cancer of the colon. She declines any post hospital follow up needs with [facility].  Will have patient status from active to inactive with [facility].   Of note, [facility] Care Management services does not replace or interfere with any services that are needed or arranged by inpatient case management or social work.  For additional questions or referrals please contact:  Natividad Brood, RN BSN [facility] Liaison  [phone] business mobile phone Toll free office [phone]

## 2018-12-23 NOTE — Progress Notes (Addendum)
Progress Note  Patient Name: Cassandra Allen [facility] Date of Encounter: 12/23/2018  Primary Cardiologist: Thompson Grayer, MD/Donna Kayleen Memos in Afib clinic - new to general cardiology Dr. Margaretann Loveless  Subjective   Says she needs to leave AMA due to ride. States she feels completely back to baseline from cardiac standpoint without further CP or dyspnea. No longer on telemetry, currently dressed. 2D echo result still pending.  Inpatient Medications    Scheduled Meds: . alum & mag hydroxide-simeth  15 mL Oral Once  . apixaban  5 mg Oral BID  . cholecalciferol  1,000 Units Oral QHS  . diltiazem  120 mg Oral BID  . insulin aspart  0-9 Units Subcutaneous TID WC  . insulin detemir  7 Units Subcutaneous QAC breakfast  . multivitamin  1 tablet Oral Daily  . multivitamin with minerals  2 tablet Oral Q1200  . oxybutynin  20 mg Oral Daily  . sertraline  100 mg Oral Daily  . simvastatin  20 mg Oral QHS  . sodium chloride flush  3 mL Intravenous Once   Continuous Infusions: . sodium chloride 75 mL/hr at 12/22/18 1809   PRN Meds: acetaminophen, diclofenac sodium, diphenoxylate-atropine, hydrALAZINE, HYDROcodone-acetaminophen, morphine injection, nitroGLYCERIN, ondansetron (ZOFRAN) IV, simethicone, sodium chloride flush   Vital Signs    Vitals:   12/22/18 2051 12/23/18 0017 12/23/18 0506 12/23/18 1210  BP: (!) 102/56 97/63 118/69 130/80  Pulse: 68 84 69 (!) 117  Resp: 16 18 16 19   Temp: 98.7 F (37.1 C) (!) 100.4 F (38 C) 98.5 F (36.9 C) 98.9 F (37.2 C)  TempSrc: Oral Oral Oral Oral  SpO2: 97% 98% 97% 99%  Weight:   79.9 kg   Height:        Intake/Output Summary (Last 24 hours) at 12/23/2018 1327 Last data filed at 12/23/2018 0500 Gross per 24 hour  Intake 1995.17 ml  Output 1700 ml  Net 295.17 ml   Last 3 Weights 12/23/2018 12/22/2018 12/21/2018  Weight (lbs) 176 lb 3.2 oz 178 lb 14.4 oz 178 lb 9.2 oz  Weight (kg) 79.924 kg 81.149 kg 81 kg     Telemetry    Unavailable -  Personally Reviewed  Physical Exam   GEN: No acute distress, obese.  HEENT: Normocephalic, atraumatic, sclera non-icteric. Neck: No JVD or bruits. Cardiac: Irregularly irregular, rate sounds around 90bpm, no murmurs, rubs, or gallops.  Radials/DP/PT 1+ and equal bilaterally.  Respiratory: Clear to auscultation bilaterally. Breathing is unlabored. GI: Soft, nontender, non-distended, BS +x 4. MS: no deformity. Extremities: No clubbing or cyanosis. No edema. Distal pedal pulses are 2+ and equal bilaterally. Neuro:  AAOx3. Follows commands. Psych:  Responds to questions appropriately with a normal affect.  Labs    Chemistry Recent Labs  Lab 12/18/18 1029 12/21/18 2055 12/22/18 1322 12/23/18 0423  NA 141 140 141 141  K 4.4 4.1 4.5 4.6  CL 107 102 107 108  CO2 28 25 25 25   GLUCOSE 165* 149* 114* 126*  BUN 37* 43* 35* 38*  CREATININE 1.04* 1.47* 1.19* 1.34*  CALCIUM 8.6* 8.7* 8.8* 8.1*  PROT 5.8* 5.5*  --   --   ALBUMIN 3.2* 3.0* 3.1*  --   AST 60* 14*  --   --   ALT 117* 38  --   --   ALKPHOS 147* 84  --   --   BILITOT 0.5 0.6  --   --   GFRNONAA 54* 35* 46* 39*  GFRAA >60 41* 53* 46*  ANIONGAP 6 13 9 8      Hematology Recent Labs  Lab 12/18/18 1029 12/21/18 2055  WBC 6.3 2.6*  RBC 2.95* 3.16*  HGB 8.8* 9.1*  HCT 28.7* 30.7*  MCV 97.3 97.2  MCH 29.8 28.8  MCHC 30.7 29.6*  RDW 16.2* 15.2  PLT 142* 202    Cardiac Enzymes Recent Labs  Lab 12/22/18 0113 12/22/18 0639 12/22/18 1322 12/22/18 1949  TROPONINI 0.04* <0.03 <0.03 <0.03    Recent Labs  Lab 12/21/18 2056 12/22/18 0115  TROPIPOC 0.02 0.05     BNP Recent Labs  Lab 12/22/18 1322  BNP 388.1*     DDimer No results for input(s): DDIMER in the last 168 hours.   Radiology    Dg Chest 2 View  Result Date: 12/21/2018 CLINICAL DATA:  Midline chest pain.  Shortness of breath EXAM: CHEST - 2 VIEW COMPARISON:  Chest radiograph 11/02/2018 FINDINGS: Right anterior chest wall Port-A-Cath is present  with tip projecting over the superior vena cava. Monitoring leads overlie the patient. Cardiomegaly. Pulmonary vascular redistribution. Mild interstitial opacities. No pleural effusion. Thoracic spine degenerative changes. IMPRESSION: Cardiomegaly. Pulmonary vascular redistribution and mild interstitial edema. Electronically Signed   By: Lovey Newcomer M.D.   On: 12/21/2018 21:11   Patient Profile     73 y.o. female with recently diagnosed metastatic colon CA (CT suspicious for numerous locations, pending PET), permanent AF on Xarelto, CKD III, HTN, DM admitted with chest discomfort and SOB. Troponin was mildly abnormal 0.04 with subsequent value negative. CXR c/w CHF, BNP mildly elevated.  Assessment & Plan    1. Chest discomfort/dyspnea felt c/w acute diastolic CHF - patient received 1 dose of IV Lasix (20mg ) with resolution of symptoms, weight down 2lb. Currently feeling much better, unwilling to wait for echo result.  We can f/u in OP setting. Metastatic CA diagnosis with ongoing decision-making will also impact the aggressiveness with which we approach any abnormalities on echocardiogram. Consideration could be given to sending home with PRN Lasix. Reviewed 2g sodium restriction, 2L fluid restriction, daily weights with patient.  2. Permanent AF - no telemetry available for review. HR elevated on last vitals check in EMR but currently sounds around 90bpm. No palpitations. Continue anticoagulation as tolerated; anemia is followed by onc most recently.  3. HTN - controlled on present regimen. Hyzaar on hold due to softer BPs.  F/u appt scheduled with Dr. Delphina Cahill care team APP, Arnold Long, on 2/18. Added to AVS.  For questions or updates, please contact [place] Please consult [email or web address] for contact info under Cardiology/STEMI.  Signed, Charlie Pitter, PA-C 12/23/2018, 1:27 PM

## 2018-12-23 NOTE — Discharge Summary (Signed)
Physician Discharge Summary  Patient ID: Cassandra Allen MRN: [id number] DOB/AGE: 1945-11-25 73 y.o.  Admit date: 12/21/2018 Discharge date: 12/23/2018  Admission Diagnoses:  Discharge Diagnoses:  Principal Problem:   Chest pain Active Problems:   Essential (primary) hypertension   Diabetes mellitus without complication ([place])   Anemia   Cancer of left colon (HCC)   HLD (hyperlipidemia)   Acute renal failure superimposed on stage 2 chronic kidney disease (HCC)   Atrial fibrillation, chronic   Depression   Discharged Condition: stable  Hospital Course: Patient is a 73 year old female, with past medical history significant for hypertension, hyperlipidemia, diabetes mellitus, colon cancer, GI bleed, anemia, atrial fibrillation on Eliquis, chronic kidney disease stage II.  Patient was admitted with chest pain.  Troponin remained negative.  EKG revealed atrial fibrillation, with Q waves in leads III and aVF.  On further questioning, patient endorsed symptoms of heart failure.  Patient was admitted for further assessment and management.  Cardiac enzymes were cycled, and they came back negative.  Cardiology team was consulted to assist with patient's care.  Patient was treated for acute on chronic likely diastolic dysfunction.  Patient has been adequately diuresed.  Patient is eager to be discharged back home.  Patient will follow with a primary care provider and cardiologist about discharge.  Chest pain:  Cardiac enzymes were cycled and they came back negative.   Cardiology team was consulted to assist with care. Chest pain has resolved. Patient will follow with PCP and cardiology on discharge.  Acute on chronic diastolic congestive heart failure: -Echocardiogram revealed EF of 60 to 65%.  Diastolic dysfunction could not be assessed due to atrial fibrillation. -Patient was initially treated with IV Lasix. -Patient will be discharged on Lasix 20 Mg p.o. daily for greater than 3 pounds  weight gain in 1 day or 5 pounds in a week. -Follow-up with cardiology on discharge.  HTN:  -Continue home medications: -Hyzaar is on hold due to worsening renal function -Continue Cardizem -IV hydralazine prn  Diabetes mellitus without complication ([place]):  Last A1c 56 on 08/09/18, well controled. -Continue to optimize.  Anemia:  -Likely multifactorial.   -Continue to monitor.    Cancer of left colon [facility]): Patient is followed up with Dr. Burr Medico, currently on chemotherapy, last dose was on Wednesday -f/u with Dr. Burr Medico  HLD (hyperlipidemia): -Zocor  AoCKD-III versus stable CKD 3: -Continue to monitor.  Atrial Fibrillation:  CHA2DS2-VASc Score is 4 Continue anticoagulation (Eliquis). Rate control with Cardizem.  Depression: -Zoloft  Consults: cardiology  Significant Diagnostic Studies:   Discharge Exam: Blood pressure 130/80, pulse (!) 117, temperature 98.9 F (37.2 C), temperature source Oral, resp. rate 19, height 5\' 3"  (1.6 m), weight 79.9 kg, SpO2 99 %.   Disposition: Discharge disposition: 01-Home or Self Care     Discharge Instructions    Diet - low sodium heart healthy   Complete by:  As directed    Increase activity slowly   Complete by:  As directed      Allergies as of 12/23/2018      Reactions   Metformin And Related Nausea And Vomiting   Prednisone Nausea And Vomiting      Medication List    STOP taking these medications   beta carotene w/minerals tablet   lidocaine-prilocaine cream Commonly known as:  EMLA   losartan-hydrochlorothiazide 100-12.5 MG tablet Commonly known as:  HYZAAR   ondansetron 8 MG tablet Commonly known as:  ZOFRAN   oxybutynin 3.9 MG/24HR Commonly known as:  OXYTROL   prochlorperazine 10 MG tablet Commonly known as:  COMPAZINE     TAKE these medications   apixaban 5 MG Tabs tablet Commonly known as:  ELIQUIS TAKE 1 TABLET (5 MG TOTAL) BY MOUTH 2 (TWO) TIMES DAILY. What changed:    how much to  take  how to take this  when to take this  additional instructions   cholecalciferol 1000 units tablet Commonly known as:  VITAMIN D Take 1,000 Units by mouth at bedtime.   diltiazem 120 MG 24 hr capsule Commonly known as:  CARDIZEM CD Take 1 capsule (120 mg total) by mouth 2 (two) times daily.   diphenoxylate-atropine 2.5-0.025 MG tablet Commonly known as:  LOMOTIL Take 1 to 2 tablets 4 times daily as needed What changed:    how much to take  how to take this  when to take this  reasons to take this  additional instructions   furosemide 20 MG tablet Commonly known as:  LASIX Lasix 20 mg po daily PRN for weight of greater than 3 lbs in a day or 5 lbs in a week   HYDROcodone-acetaminophen 7.5-325 MG tablet Commonly known as:  NORCO Take 1 tablet by mouth 2 (two) times daily as needed (pain).   insulin detemir 100 UNIT/ML injection Commonly known as:  LEVEMIR Inject 0.07 mLs (7 Units total) into the skin daily before breakfast. Start taking on:  December 24, 2018 What changed:  how much to take   liraglutide 18 MG/3ML Sopn Commonly known as:  VICTOZA Inject 1.8 mg into the skin daily before breakfast.   multivitamin with minerals Tabs tablet Take 2 tablets by mouth daily at 12 noon.   oxybutynin 5 MG tablet Commonly known as:  DITROPAN Take 20 mg by mouth daily.   sertraline 100 MG tablet Commonly known as:  ZOLOFT Take 100 mg by mouth daily.   simvastatin 20 MG tablet Commonly known as:  ZOCOR Take 20 mg by mouth at bedtime.      Follow-up Information    Lendon Colonel, NP Follow up.   Specialties:  Nurse Practitioner, Radiology, Cardiology Why:  [facility] - 12/31/18 at [place] is one of the NPs that works closely with our cardiology team. Please arrive 15 minutes early to check in. Contact information: [address] [place] [address] [phone]            Signed: Bonnell Public 12/23/2018, 2:27 PM

## 2018-12-23 NOTE — Progress Notes (Signed)
Pt was anxious about her discharge since her daughter was able to come get her but has a tight window of 12N-2p to pick her up. Pt was instructed by RN that we could have a ride arranged for her if her discharge falls past that time. She continues to state she wants to leave. ECHO was completed, Cardiology stated they had not rounded on the patient and still needed to read the ECHO. Pt continues to state she needs to go. Primary MD notified of possible AMA and states he will place d/c order. IV team consult placed Port deaccess.

## 2018-12-23 NOTE — Progress Notes (Signed)
  Echocardiogram 2D Echocardiogram has been performed.  Cassandra Allen 12/23/2018, 10:02 AM

## 2018-12-25 ENCOUNTER — Encounter (HOSPITAL_COMMUNITY): Payer: Self-pay

## 2018-12-25 ENCOUNTER — Inpatient Hospital Stay (HOSPITAL_COMMUNITY)
Admission: EM | Admit: 2018-12-25 | Discharge: 2019-01-10 | DRG: 329 | Disposition: A | Discharge status: Skilled Nursing Facility | Payer: Medicare HMO | Attending: Internal Medicine | Admitting: Internal Medicine

## 2018-12-25 ENCOUNTER — Other Ambulatory Visit: Payer: Self-pay

## 2018-12-25 ENCOUNTER — Telehealth: Payer: Self-pay | Admitting: Hematology

## 2018-12-25 DIAGNOSIS — K567 Ileus, unspecified: Secondary | ICD-10-CM | POA: Diagnosis not present

## 2018-12-25 DIAGNOSIS — E119 Type 2 diabetes mellitus without complications: Secondary | ICD-10-CM

## 2018-12-25 DIAGNOSIS — I9581 Postprocedural hypotension: Secondary | ICD-10-CM | POA: Diagnosis not present

## 2018-12-25 DIAGNOSIS — Z79899 Other long term (current) drug therapy: Secondary | ICD-10-CM

## 2018-12-25 DIAGNOSIS — Z9049 Acquired absence of other specified parts of digestive tract: Secondary | ICD-10-CM

## 2018-12-25 DIAGNOSIS — Z781 Physical restraint status: Secondary | ICD-10-CM

## 2018-12-25 DIAGNOSIS — K08409 Partial loss of teeth, unspecified cause, unspecified class: Secondary | ICD-10-CM | POA: Diagnosis not present

## 2018-12-25 DIAGNOSIS — K559 Vascular disorder of intestine, unspecified: Secondary | ICD-10-CM | POA: Diagnosis present

## 2018-12-25 DIAGNOSIS — I5031 Acute diastolic (congestive) heart failure: Secondary | ICD-10-CM | POA: Diagnosis present

## 2018-12-25 DIAGNOSIS — Z794 Long term (current) use of insulin: Secondary | ICD-10-CM

## 2018-12-25 DIAGNOSIS — Z8249 Family history of ischemic heart disease and other diseases of the circulatory system: Secondary | ICD-10-CM

## 2018-12-25 DIAGNOSIS — E46 Unspecified protein-calorie malnutrition: Secondary | ICD-10-CM | POA: Diagnosis present

## 2018-12-25 DIAGNOSIS — K8050 Calculus of bile duct without cholangitis or cholecystitis without obstruction: Secondary | ICD-10-CM

## 2018-12-25 DIAGNOSIS — N183 Chronic kidney disease, stage 3 unspecified: Secondary | ICD-10-CM

## 2018-12-25 DIAGNOSIS — E1165 Type 2 diabetes mellitus with hyperglycemia: Secondary | ICD-10-CM | POA: Diagnosis not present

## 2018-12-25 DIAGNOSIS — I130 Hypertensive heart and chronic kidney disease with heart failure and stage 1 through stage 4 chronic kidney disease, or unspecified chronic kidney disease: Secondary | ICD-10-CM | POA: Diagnosis present

## 2018-12-25 DIAGNOSIS — N179 Acute kidney failure, unspecified: Secondary | ICD-10-CM | POA: Diagnosis present

## 2018-12-25 DIAGNOSIS — D509 Iron deficiency anemia, unspecified: Secondary | ICD-10-CM | POA: Diagnosis present

## 2018-12-25 DIAGNOSIS — Z4659 Encounter for fitting and adjustment of other gastrointestinal appliance and device: Secondary | ICD-10-CM

## 2018-12-25 DIAGNOSIS — G8918 Other acute postprocedural pain: Secondary | ICD-10-CM | POA: Diagnosis not present

## 2018-12-25 DIAGNOSIS — D3502 Benign neoplasm of left adrenal gland: Secondary | ICD-10-CM | POA: Diagnosis present

## 2018-12-25 DIAGNOSIS — C185 Malignant neoplasm of splenic flexure: Secondary | ICD-10-CM | POA: Diagnosis present

## 2018-12-25 DIAGNOSIS — Z95828 Presence of other vascular implants and grafts: Secondary | ICD-10-CM

## 2018-12-25 DIAGNOSIS — E870 Hyperosmolality and hypernatremia: Secondary | ICD-10-CM | POA: Diagnosis not present

## 2018-12-25 DIAGNOSIS — K2950 Unspecified chronic gastritis without bleeding: Secondary | ICD-10-CM | POA: Diagnosis present

## 2018-12-25 DIAGNOSIS — E11649 Type 2 diabetes mellitus with hypoglycemia without coma: Secondary | ICD-10-CM | POA: Diagnosis not present

## 2018-12-25 DIAGNOSIS — C189 Malignant neoplasm of colon, unspecified: Secondary | ICD-10-CM | POA: Diagnosis present

## 2018-12-25 DIAGNOSIS — I87321 Chronic venous hypertension (idiopathic) with inflammation of right lower extremity: Secondary | ICD-10-CM | POA: Diagnosis present

## 2018-12-25 DIAGNOSIS — Z7901 Long term (current) use of anticoagulants: Secondary | ICD-10-CM

## 2018-12-25 DIAGNOSIS — C186 Malignant neoplasm of descending colon: Secondary | ICD-10-CM | POA: Diagnosis present

## 2018-12-25 DIAGNOSIS — Z79891 Long term (current) use of opiate analgesic: Secondary | ICD-10-CM

## 2018-12-25 DIAGNOSIS — D696 Thrombocytopenia, unspecified: Secondary | ICD-10-CM | POA: Diagnosis not present

## 2018-12-25 DIAGNOSIS — F329 Major depressive disorder, single episode, unspecified: Secondary | ICD-10-CM | POA: Diagnosis present

## 2018-12-25 DIAGNOSIS — D631 Anemia in chronic kidney disease: Secondary | ICD-10-CM | POA: Diagnosis present

## 2018-12-25 DIAGNOSIS — I5033 Acute on chronic diastolic (congestive) heart failure: Secondary | ICD-10-CM | POA: Diagnosis present

## 2018-12-25 DIAGNOSIS — Z86010 Personal history of colonic polyps: Secondary | ICD-10-CM

## 2018-12-25 DIAGNOSIS — K8070 Calculus of gallbladder and bile duct without cholecystitis without obstruction: Secondary | ICD-10-CM | POA: Diagnosis present

## 2018-12-25 DIAGNOSIS — R062 Wheezing: Secondary | ICD-10-CM

## 2018-12-25 DIAGNOSIS — Z888 Allergy status to other drugs, medicaments and biological substances status: Secondary | ICD-10-CM

## 2018-12-25 DIAGNOSIS — E669 Obesity, unspecified: Secondary | ICD-10-CM | POA: Diagnosis present

## 2018-12-25 DIAGNOSIS — I482 Chronic atrial fibrillation, unspecified: Secondary | ICD-10-CM | POA: Diagnosis present

## 2018-12-25 DIAGNOSIS — D62 Acute posthemorrhagic anemia: Secondary | ICD-10-CM | POA: Diagnosis not present

## 2018-12-25 DIAGNOSIS — Z6830 Body mass index (BMI) 30.0-30.9, adult: Secondary | ICD-10-CM

## 2018-12-25 DIAGNOSIS — R0902 Hypoxemia: Secondary | ICD-10-CM | POA: Diagnosis not present

## 2018-12-25 DIAGNOSIS — I10 Essential (primary) hypertension: Secondary | ICD-10-CM | POA: Diagnosis present

## 2018-12-25 DIAGNOSIS — K56609 Unspecified intestinal obstruction, unspecified as to partial versus complete obstruction: Secondary | ICD-10-CM

## 2018-12-25 DIAGNOSIS — D649 Anemia, unspecified: Secondary | ICD-10-CM

## 2018-12-25 DIAGNOSIS — C187 Malignant neoplasm of sigmoid colon: Principal | ICD-10-CM | POA: Diagnosis present

## 2018-12-25 DIAGNOSIS — E860 Dehydration: Secondary | ICD-10-CM | POA: Diagnosis present

## 2018-12-25 DIAGNOSIS — C787 Secondary malignant neoplasm of liver and intrahepatic bile duct: Secondary | ICD-10-CM | POA: Diagnosis present

## 2018-12-25 DIAGNOSIS — K659 Peritonitis, unspecified: Secondary | ICD-10-CM | POA: Diagnosis not present

## 2018-12-25 DIAGNOSIS — IMO0001 Reserved for inherently not codable concepts without codable children: Secondary | ICD-10-CM

## 2018-12-25 DIAGNOSIS — N390 Urinary tract infection, site not specified: Secondary | ICD-10-CM | POA: Diagnosis not present

## 2018-12-25 DIAGNOSIS — D500 Iron deficiency anemia secondary to blood loss (chronic): Secondary | ICD-10-CM

## 2018-12-25 DIAGNOSIS — E876 Hypokalemia: Secondary | ICD-10-CM | POA: Diagnosis not present

## 2018-12-25 DIAGNOSIS — B952 Enterococcus as the cause of diseases classified elsewhere: Secondary | ICD-10-CM | POA: Diagnosis not present

## 2018-12-25 DIAGNOSIS — E1122 Type 2 diabetes mellitus with diabetic chronic kidney disease: Secondary | ICD-10-CM | POA: Diagnosis present

## 2018-12-25 HISTORY — DX: Obesity, unspecified: E66.9

## 2018-12-25 HISTORY — DX: Chronic atrial fibrillation, unspecified: I48.20

## 2018-12-25 HISTORY — DX: Malignant neoplasm of sigmoid colon: C18.7

## 2018-12-25 HISTORY — DX: Malignant neoplasm of descending colon: C18.6

## 2018-12-25 HISTORY — DX: Acute diastolic (congestive) heart failure: I50.31

## 2018-12-25 LAB — CBC WITH DIFFERENTIAL/PLATELET
Abs Immature Granulocytes: 0.08 10*3/uL — ABNORMAL HIGH (ref 0.00–0.07)
Basophils Absolute: 0 10*3/uL (ref 0.0–0.1)
Basophils Relative: 0 %
EOS ABS: 0 10*3/uL (ref 0.0–0.5)
EOS PCT: 0 %
HCT: 33.2 % — ABNORMAL LOW (ref 36.0–46.0)
Hemoglobin: 10.4 g/dL — ABNORMAL LOW (ref 12.0–15.0)
Immature Granulocytes: 1 %
Lymphocytes Relative: 3 %
Lymphs Abs: 0.4 10*3/uL — ABNORMAL LOW (ref 0.7–4.0)
MCH: 30 pg (ref 26.0–34.0)
MCHC: 31.3 g/dL (ref 30.0–36.0)
MCV: 95.7 fL (ref 80.0–100.0)
Monocytes Absolute: 1 10*3/uL (ref 0.1–1.0)
Monocytes Relative: 9 %
Neutro Abs: 10.3 10*3/uL — ABNORMAL HIGH (ref 1.7–7.7)
Neutrophils Relative %: 87 %
Platelets: 309 10*3/uL (ref 150–400)
RBC: 3.47 MIL/uL — ABNORMAL LOW (ref 3.87–5.11)
RDW: 15.9 % — AB (ref 11.5–15.5)
WBC Morphology: INCREASED
WBC: 11.8 10*3/uL — ABNORMAL HIGH (ref 4.0–10.5)
nRBC: 0 % (ref 0.0–0.2)

## 2018-12-25 LAB — LIPASE, BLOOD: Lipase: 24 U/L (ref 11–51)

## 2018-12-25 MED ORDER — SODIUM CHLORIDE 0.9 % IV BOLUS
1000.0000 mL | Freq: Once | INTRAVENOUS | Status: AC
  Administered 2018-12-25: 1000 mL via INTRAVENOUS

## 2018-12-25 MED ORDER — ONDANSETRON HCL 4 MG/2ML IJ SOLN
4.0000 mg | Freq: Once | INTRAMUSCULAR | Status: AC
  Administered 2018-12-25: 4 mg via INTRAVENOUS
  Filled 2018-12-25: qty 2

## 2018-12-25 MED ORDER — MORPHINE SULFATE (PF) 4 MG/ML IV SOLN
4.0000 mg | Freq: Once | INTRAVENOUS | Status: AC
  Administered 2018-12-25: 4 mg via INTRAVENOUS
  Filled 2018-12-25: qty 1

## 2018-12-25 NOTE — ED Provider Notes (Signed)
[facility] DEPT Provider Note   CSN: [id number] Arrival date & time: 12/25/18  2138     History   Chief Complaint No chief complaint on file.   HPI Cassandra Allen is a 73 y.o. female.  HPI   Abdominal pain since Friday, lower abdomen, cramping and sharp. Had loose stool this AM x1, and since then no BM.  Feels bloated, distended, not passing flatus.  Nausea, very small amount of vomiting. Not able to eat.  No fevers, always feels hot.  No urinary symptoms. No hx of abdominal surgery.  Chemo last 1 week ago.   Past Medical History:  Diagnosis Date  . Arthritis   . Cancer ([place])   . Diabetes mellitus without complication ([place])   . Hypertension     Patient Active Problem List   Diagnosis Date Noted  . HLD (hyperlipidemia) 12/22/2018  . Acute renal failure superimposed on stage 2 chronic kidney disease ([place]) 12/22/2018  . Atrial fibrillation, chronic 12/22/2018  . Depression 12/22/2018  . Chest pain 12/22/2018  . Port-A-Cath in place 12/04/2018  . Goals of care, counseling/discussion 11/22/2018  . GI bleed 11/02/2018  . Rectal bleed   . Cancer of left colon ([place]) 10/30/2018  . Anemia 07/06/2018  . Iron deficiency anemia 04/04/2018  . 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 Pain in right ankle and joints of right foot 01/10/2018  . Acute renal failure ([place]) 03/25/2015  . Essential (primary) hypertension 03/25/2015  . Diabetes mellitus without complication ([place]) 37/02/8888  . AKI (acute kidney injury) ([place]) 03/25/2015    Past Surgical History:  Procedure Laterality Date  . IR IMAGING GUIDED PORT INSERTION  11/20/2018     OB History   No obstetric history on file.      Home Medications    Prior to Admission medications   Medication Sig Start Date End Date Taking? Authorizing Provider  apixaban (ELIQUIS) 5 MG TABS tablet TAKE 1 TABLET (5 MG TOTAL) BY MOUTH 2 (TWO) TIMES  DAILY. Patient taking differently: Take 5 mg by mouth 2 (two) times daily.  09/19/18  Yes [name], NP  diltiazem (CARDIZEM CD) 120 MG 24 hr capsule Take 1 capsule (120 mg total) by mouth 2 (two) times daily. 11/15/18  Yes [name], NP  furosemide (LASIX) 20 MG tablet Lasix 20 mg po daily PRN for weight of greater than 3 lbs in a day or 5 lbs in a week 12/23/18  Yes Bonnell Public, MD  HYDROcodone-acetaminophen (NORCO) 7.5-325 MG tablet Take 1 tablet by mouth 2 (two) times daily as needed (pain).    Yes [provider]  insulin detemir (LEVEMIR) 100 UNIT/ML injection Inject 0.07 mLs (7 Units total) into the skin daily before breakfast. 12/24/18  Yes Ogbata, Babs Bertin, MD  liraglutide (VICTOZA) 18 MG/3ML SOPN Inject 1.8 mg into the skin daily before breakfast.    Yes [provider]  Multiple Vitamin (MULTIVITAMIN WITH MINERALS) TABS tablet Take 2 tablets by mouth daily at 12 noon.    Yes [provider]  oxybutynin (DITROPAN) 5 MG tablet Take 20 mg by mouth daily.   Yes [provider]  oxybutynin (DITROPAN-XL) 10 MG 24 hr tablet Take 10 mg by mouth daily. 12/16/18  Yes [provider]  sertraline (ZOLOFT) 100 MG tablet Take 100 mg by mouth daily.    Yes [provider]  simvastatin (ZOCOR) 20 MG tablet Take 20 mg by  mouth at bedtime.    Yes [provider]  diphenoxylate-atropine (LOMOTIL) 2.5-0.025 MG tablet Take 1 to 2 tablets 4 times daily as needed Patient not taking: Reported on 12/25/2018 09/18/18   Alla Feeling, NP    Family History Family History  Problem Relation Age of Onset  . Heart attack Mother   . Heart attack Father     Social History Social History   Tobacco Use  . Smoking status: Never Smoker  . Smokeless tobacco: Never Used  Substance Use Topics  . Alcohol use: No  . Drug use: No     Allergies   Metformin and related and Prednisone   Review of Systems Review of Systems    Constitutional: Negative for fever.  HENT: Negative for sore throat.   Eyes: Negative for visual disturbance.  Respiratory: Negative for cough and shortness of breath.   Cardiovascular: Negative for chest pain ("not this week").  Gastrointestinal: Positive for abdominal pain, constipation, diarrhea, nausea and vomiting.  Genitourinary: Negative for difficulty urinating and dysuria.  Musculoskeletal: Negative for back pain and neck pain.  Skin: Negative for rash.  Neurological: Negative for syncope and headaches.     Physical Exam Updated Vital Signs BP 106/68 (BP Location: Right Arm)   Pulse 97   Temp 98.1 F (36.7 C) (Oral)   Resp 19   Ht 5\' 3"  (1.6 m)   Wt 79.4 kg   SpO2 96%   BMI 31.00 kg/m   Physical Exam Vitals signs and nursing note reviewed.  Constitutional:      General: She is not in acute distress.    Appearance: She is well-developed. She is ill-appearing. She is not diaphoretic.  HENT:     Head: Normocephalic and atraumatic.  Eyes:     Conjunctiva/sclera: Conjunctivae normal.  Neck:     Musculoskeletal: Normal range of motion.  Cardiovascular:     Rate and Rhythm: Tachycardia present. Rhythm irregular.     Heart sounds: Normal heart sounds. No murmur. No friction rub. No gallop.   Pulmonary:     Effort: Pulmonary effort is normal. No respiratory distress.     Breath sounds: Normal breath sounds. No wheezing or rales.  Abdominal:     General: There is distension.     Palpations: Abdomen is soft.     Tenderness: There is abdominal tenderness. There is no guarding.  Musculoskeletal:        General: No tenderness.     Right lower leg: Edema present.     Left lower leg: Edema present.  Skin:    General: Skin is warm and dry.     Findings: No erythema or rash.  Neurological:     Mental Status: She is alert and oriented to person, place, and time.      ED Treatments / Results  Labs (all labs ordered are listed, but only abnormal results are  displayed) Labs Reviewed  CBC WITH DIFFERENTIAL/PLATELET - Abnormal; Notable for the following components:      Result Value   WBC 11.8 (*)    RBC 3.47 (*)    Hemoglobin 10.4 (*)    HCT 33.2 (*)    RDW 15.9 (*)    Neutro Abs 10.3 (*)    Lymphs Abs 0.4 (*)    Abs Immature Granulocytes 0.08 (*)    All other components within normal limits  COMPREHENSIVE METABOLIC PANEL - Abnormal; Notable for the following components:   Sodium 134 (*)    CO2 21 (*)  Glucose, Bld 271 (*)    BUN 112 (*)    Creatinine, Ser 2.32 (*)    Calcium 7.1 (*)    Total Protein 5.3 (*)    Albumin 2.7 (*)    GFR calc non Af Amer 20 (*)    GFR calc Af Amer 24 (*)    All other components within normal limits  LIPASE, BLOOD  URINALYSIS, ROUTINE W REFLEX MICROSCOPIC    EKG None  Radiology No results found.  Procedures Procedures (including critical care time)  Medications Ordered in ED Medications  iohexol (OMNIPAQUE) 300 MG/ML solution 30 mL (has no administration in time range)  sodium chloride 0.9 % bolus 1,000 mL (has no administration in time range)  morphine 4 MG/ML injection 4 mg (4 mg Intravenous Given 12/25/18 2258)  sodium chloride 0.9 % bolus 1,000 mL (0 mLs Intravenous Stopped 12/25/18 2358)  ondansetron (ZOFRAN) injection 4 mg (4 mg Intravenous Given 12/25/18 2258)     Initial Impression / Assessment and Plan / ED Course  I have reviewed the triage vital signs and the nursing notes.  Pertinent labs & imaging results that were available during my care of the patient were reviewed by me and considered in my medical decision making (see chart for details).     73 year old female with history of left colon cancer diagnosed December 2019, with hepatic lesion suspicious for metastases awaiting PET which is scheduled 2/14 on FOLFOX last chemo 1 week ago, anemia, hypertension and atrial fibrillation on eliquis, DM, who presents with concern for abdominal pain, distention, and nausea.  She is in  atrial fibrillation, appears dry at this time, given IV fluids and medicine for pain and nausea.  Concern for bowel obstruction on exam, and consider other cancer etiology of symptoms. Labs significant for AKI from 2 days ago, suspect possible dehydration.  CT pending at time of transfer of care to Dr. Florina Ou.  Final Clinical Impressions(s) / ED Diagnoses   Final diagnoses:  Generalized abdominal pain  Non-intractable vomiting with nausea, unspecified vomiting type    ED Discharge Orders    None       Gareth Morgan, MD 12/26/18 0028

## 2018-12-25 NOTE — ED Triage Notes (Addendum)
Per EMS, patient coming from home with complaints of abdominal pain, that is worse in the RLQ. Patient is also complaining of N/V for the past two days. Last BM was today and she reports that it was loose, but patient states she has been taking a stool softener since Friday.   Patient has a history of colon cancer and her last treatment was last Wednesday.

## 2018-12-25 NOTE — Telephone Encounter (Signed)
Pt called to complain of epigastric pain, weakness, and poor oral intake.  She was recently hospitalized for chest pain and diastolic heart failure.  She states her pain is persistent, 4/10, she has tried MiraLAX and other laxatives and did have a bowel movement today.  However her pain did not improve.  She is also feeling very fatigued, not able to do much at home, not eating much.  She denies any fever, chills, or nausea.  I offered prescription of PPI to her pharmacy, but she is not able to pick up today.  I recommend her to come in tomorrow morning to see [place], however she is not able to drive, her son is at work, will come back at midnight.  I suggested her to discuss with her son in the morning and bring her in tomorrow morning.  She will call us in the morning.  Truitt Merle  12/25/2018

## 2018-12-25 NOTE — ED Notes (Signed)
Bed: TY03 Expected date:  Expected time:  Means of arrival:  Comments: 73 y/o F vomiting

## 2018-12-26 ENCOUNTER — Emergency Department (HOSPITAL_COMMUNITY): Payer: Medicare HMO

## 2018-12-26 ENCOUNTER — Inpatient Hospital Stay (HOSPITAL_COMMUNITY): Payer: Medicare HMO

## 2018-12-26 DIAGNOSIS — C185 Malignant neoplasm of splenic flexure: Secondary | ICD-10-CM | POA: Diagnosis present

## 2018-12-26 DIAGNOSIS — R1031 Right lower quadrant pain: Secondary | ICD-10-CM | POA: Diagnosis present

## 2018-12-26 DIAGNOSIS — C799 Secondary malignant neoplasm of unspecified site: Secondary | ICD-10-CM | POA: Diagnosis not present

## 2018-12-26 DIAGNOSIS — I482 Chronic atrial fibrillation, unspecified: Secondary | ICD-10-CM | POA: Diagnosis present

## 2018-12-26 DIAGNOSIS — C787 Secondary malignant neoplasm of liver and intrahepatic bile duct: Secondary | ICD-10-CM | POA: Diagnosis present

## 2018-12-26 DIAGNOSIS — E870 Hyperosmolality and hypernatremia: Secondary | ICD-10-CM | POA: Diagnosis not present

## 2018-12-26 DIAGNOSIS — I9581 Postprocedural hypotension: Secondary | ICD-10-CM | POA: Diagnosis not present

## 2018-12-26 DIAGNOSIS — I5031 Acute diastolic (congestive) heart failure: Secondary | ICD-10-CM | POA: Diagnosis not present

## 2018-12-26 DIAGNOSIS — K8070 Calculus of gallbladder and bile duct without cholecystitis without obstruction: Secondary | ICD-10-CM | POA: Diagnosis present

## 2018-12-26 DIAGNOSIS — K56609 Unspecified intestinal obstruction, unspecified as to partial versus complete obstruction: Secondary | ICD-10-CM | POA: Diagnosis not present

## 2018-12-26 DIAGNOSIS — C186 Malignant neoplasm of descending colon: Secondary | ICD-10-CM | POA: Diagnosis present

## 2018-12-26 DIAGNOSIS — D500 Iron deficiency anemia secondary to blood loss (chronic): Secondary | ICD-10-CM | POA: Diagnosis not present

## 2018-12-26 DIAGNOSIS — K659 Peritonitis, unspecified: Secondary | ICD-10-CM | POA: Diagnosis not present

## 2018-12-26 DIAGNOSIS — C187 Malignant neoplasm of sigmoid colon: Secondary | ICD-10-CM | POA: Diagnosis present

## 2018-12-26 DIAGNOSIS — Z7901 Long term (current) use of anticoagulants: Secondary | ICD-10-CM

## 2018-12-26 DIAGNOSIS — K2950 Unspecified chronic gastritis without bleeding: Secondary | ICD-10-CM | POA: Diagnosis present

## 2018-12-26 DIAGNOSIS — N183 Chronic kidney disease, stage 3 (moderate): Secondary | ICD-10-CM | POA: Diagnosis present

## 2018-12-26 DIAGNOSIS — I130 Hypertensive heart and chronic kidney disease with heart failure and stage 1 through stage 4 chronic kidney disease, or unspecified chronic kidney disease: Secondary | ICD-10-CM | POA: Diagnosis present

## 2018-12-26 DIAGNOSIS — I959 Hypotension, unspecified: Secondary | ICD-10-CM | POA: Diagnosis not present

## 2018-12-26 DIAGNOSIS — I4891 Unspecified atrial fibrillation: Secondary | ICD-10-CM | POA: Diagnosis not present

## 2018-12-26 DIAGNOSIS — N390 Urinary tract infection, site not specified: Secondary | ICD-10-CM | POA: Diagnosis not present

## 2018-12-26 DIAGNOSIS — K559 Vascular disorder of intestine, unspecified: Secondary | ICD-10-CM | POA: Diagnosis present

## 2018-12-26 DIAGNOSIS — N179 Acute kidney failure, unspecified: Secondary | ICD-10-CM | POA: Diagnosis present

## 2018-12-26 DIAGNOSIS — D696 Thrombocytopenia, unspecified: Secondary | ICD-10-CM | POA: Diagnosis not present

## 2018-12-26 DIAGNOSIS — E11649 Type 2 diabetes mellitus with hypoglycemia without coma: Secondary | ICD-10-CM | POA: Diagnosis not present

## 2018-12-26 DIAGNOSIS — D62 Acute posthemorrhagic anemia: Secondary | ICD-10-CM | POA: Diagnosis not present

## 2018-12-26 DIAGNOSIS — D631 Anemia in chronic kidney disease: Secondary | ICD-10-CM | POA: Diagnosis present

## 2018-12-26 DIAGNOSIS — K567 Ileus, unspecified: Secondary | ICD-10-CM | POA: Diagnosis not present

## 2018-12-26 DIAGNOSIS — C189 Malignant neoplasm of colon, unspecified: Secondary | ICD-10-CM | POA: Diagnosis not present

## 2018-12-26 DIAGNOSIS — E46 Unspecified protein-calorie malnutrition: Secondary | ICD-10-CM | POA: Diagnosis present

## 2018-12-26 DIAGNOSIS — I5033 Acute on chronic diastolic (congestive) heart failure: Secondary | ICD-10-CM | POA: Diagnosis present

## 2018-12-26 DIAGNOSIS — E1122 Type 2 diabetes mellitus with diabetic chronic kidney disease: Secondary | ICD-10-CM | POA: Diagnosis present

## 2018-12-26 LAB — COMPREHENSIVE METABOLIC PANEL
ALT: 21 U/L (ref 0–44)
AST: 24 U/L (ref 15–41)
Albumin: 2.7 g/dL — ABNORMAL LOW (ref 3.5–5.0)
Alkaline Phosphatase: 73 U/L (ref 38–126)
Anion gap: 15 (ref 5–15)
BUN: 112 mg/dL — ABNORMAL HIGH (ref 8–23)
CO2: 21 mmol/L — ABNORMAL LOW (ref 22–32)
Calcium: 7.1 mg/dL — ABNORMAL LOW (ref 8.9–10.3)
Chloride: 98 mmol/L (ref 98–111)
Creatinine, Ser: 2.32 mg/dL — ABNORMAL HIGH (ref 0.44–1.00)
GFR calc non Af Amer: 20 mL/min — ABNORMAL LOW (ref >60)
GFR, EST AFRICAN AMERICAN: 24 mL/min — AB (ref >60)
Glucose, Bld: 271 mg/dL — ABNORMAL HIGH (ref 70–99)
Potassium: 4.3 mmol/L (ref 3.5–5.1)
Sodium: 134 mmol/L — ABNORMAL LOW (ref 135–145)
Total Bilirubin: 0.6 mg/dL (ref 0.3–1.2)
Total Protein: 5.3 g/dL — ABNORMAL LOW (ref 6.5–8.1)

## 2018-12-26 LAB — SURGICAL PCR SCREEN
MRSA, PCR: NEGATIVE
Staphylococcus aureus: NEGATIVE

## 2018-12-26 LAB — GLUCOSE, CAPILLARY
Glucose-Capillary: 157 mg/dL — ABNORMAL HIGH (ref 70–99)
Glucose-Capillary: 137 mg/dL — ABNORMAL HIGH (ref 70–99)
Glucose-Capillary: 115 mg/dL — ABNORMAL HIGH (ref 70–99)
Glucose-Capillary: 122 mg/dL — ABNORMAL HIGH (ref 70–99)

## 2018-12-26 LAB — BASIC METABOLIC PANEL
Anion gap: 12 (ref 5–15)
BUN: 119 mg/dL — ABNORMAL HIGH (ref 8–23)
CO2: 22 mmol/L (ref 22–32)
Calcium: 6.6 mg/dL — ABNORMAL LOW (ref 8.9–10.3)
Chloride: 103 mmol/L (ref 98–111)
Creatinine, Ser: 2.26 mg/dL — ABNORMAL HIGH (ref 0.44–1.00)
GFR calc Af Amer: 24 mL/min — ABNORMAL LOW (ref >60)
GFR, EST NON AFRICAN AMERICAN: 21 mL/min — AB (ref >60)
Glucose, Bld: 181 mg/dL — ABNORMAL HIGH (ref 70–99)
Potassium: 4.1 mmol/L (ref 3.5–5.1)
Sodium: 137 mmol/L (ref 135–145)

## 2018-12-26 LAB — CBC
HCT: 32.6 % — ABNORMAL LOW (ref 36.0–46.0)
Hemoglobin: 10.1 g/dL — ABNORMAL LOW (ref 12.0–15.0)
MCH: 30 pg (ref 26.0–34.0)
MCHC: 31 g/dL (ref 30.0–36.0)
MCV: 96.7 fL (ref 80.0–100.0)
Platelets: 308 10*3/uL (ref 150–400)
RBC: 3.37 MIL/uL — ABNORMAL LOW (ref 3.87–5.11)
RDW: 15.9 % — ABNORMAL HIGH (ref 11.5–15.5)
WBC: 13.2 10*3/uL — ABNORMAL HIGH (ref 4.0–10.5)
nRBC: 0 % (ref 0.0–0.2)

## 2018-12-26 LAB — CBG MONITORING, ED: GLUCOSE-CAPILLARY: 170 mg/dL — AB (ref 70–99)

## 2018-12-26 MED ORDER — SODIUM CHLORIDE 0.9 % IV SOLN
2.0000 g | INTRAVENOUS | Status: AC
  Filled 2018-12-26: qty 2

## 2018-12-26 MED ORDER — SODIUM CHLORIDE 0.9 % IV BOLUS
1000.0000 mL | Freq: Once | INTRAVENOUS | Status: AC
  Administered 2018-12-26: 1000 mL via INTRAVENOUS

## 2018-12-26 MED ORDER — MORPHINE SULFATE (PF) 2 MG/ML IV SOLN
2.0000 mg | INTRAVENOUS | Status: DC | PRN
  Administered 2018-12-26: 4 mg via INTRAVENOUS
  Administered 2018-12-26: 2 mg via INTRAVENOUS
  Administered 2018-12-26 (×3): 4 mg via INTRAVENOUS
  Filled 2018-12-26 (×5): qty 2

## 2018-12-26 MED ORDER — MORPHINE SULFATE (PF) 2 MG/ML IV SOLN
2.0000 mg | INTRAVENOUS | Status: DC | PRN
  Administered 2018-12-26: 2 mg via INTRAVENOUS
  Filled 2018-12-26: qty 1

## 2018-12-26 MED ORDER — ACETAMINOPHEN 10 MG/ML IV SOLN
1000.0000 mg | Freq: Four times a day (QID) | INTRAVENOUS | Status: AC
  Administered 2018-12-26 – 2018-12-27 (×3): 1000 mg via INTRAVENOUS
  Filled 2018-12-26 (×5): qty 100

## 2018-12-26 MED ORDER — IOHEXOL 300 MG/ML  SOLN: 30.0000 mL | Freq: Once | INTRAMUSCULAR | Status: DC | PRN

## 2018-12-26 MED ORDER — ONDANSETRON HCL 4 MG/2ML IJ SOLN
4.0000 mg | Freq: Four times a day (QID) | INTRAMUSCULAR | Status: DC | PRN
  Administered 2018-12-26: 4 mg via INTRAVENOUS
  Filled 2018-12-26: qty 2

## 2018-12-26 MED ORDER — MORPHINE SULFATE (PF) 4 MG/ML IV SOLN
4.0000 mg | Freq: Once | INTRAVENOUS | Status: AC
  Administered 2018-12-26: 4 mg via INTRAVENOUS
  Filled 2018-12-26: qty 1

## 2018-12-26 MED ORDER — INSULIN ASPART 100 UNIT/ML ~~LOC~~ SOLN
0.0000 [IU] | SUBCUTANEOUS | Status: DC
  Administered 2018-12-26: 1 [IU] via SUBCUTANEOUS
  Administered 2018-12-26 (×2): 2 [IU] via SUBCUTANEOUS
  Filled 2018-12-26: qty 1

## 2018-12-26 MED ORDER — ONDANSETRON HCL 4 MG PO TABS: 4.0000 mg | ORAL_TABLET | Freq: Four times a day (QID) | ORAL | Status: DC | PRN

## 2018-12-26 MED ORDER — SODIUM CHLORIDE 0.9 % IV SOLN
INTRAVENOUS | Status: DC
  Administered 2018-12-26 – 2018-12-27 (×4): via INTRAVENOUS

## 2018-12-26 NOTE — ED Notes (Signed)
ED TO INPATIENT HANDOFF REPORT  Name/Age/Gender Cassandra Allen 73 y.o. female  Code Status    Code Status Orders  (From admission, onward)         Start     Ordered   12/26/18 0419  Full code  Continuous     12/26/18 0419        Code Status History    Date Active Date Inactive Code Status Order ID Comments User Context   12/22/2018 0024 12/23/2018 1740 Full Code [id number]  Ivor Costa, MD ED   11/02/2018 1701 11/03/2018 1654 Full Code [id number]  Elodia Florence., MD Inpatient   03/26/2015 0002 03/27/2015 1625 Full Code [id number]  Allyne Gee, MD ED      Home/SNF/Other Home  Chief Complaint Abdominal pain   Level of Care/Admitting Diagnosis ED Disposition    ED Disposition Condition [facility] Area: [facility] [[id number]]  Level of Care: Med-Surg [16]  Diagnosis: SBO (small bowel obstruction) [facility]) [[id number]]  Admitting Physician: Phillips Grout [[id number]]  Attending Physician: Derrill Kay A [[id number]]  Estimated length of stay: 3 - 4 days  Certification:: I certify this patient will need inpatient services for at least 2 midnights  PT Class (Do Not Modify): Inpatient [101]  PT Acc Code (Do Not Modify): Private [1]       Medical History Past Medical History:  Diagnosis Date  . Arthritis   . Cancer ([place])   . Diabetes mellitus without complication ([place])   . Hypertension     Allergies Allergies  Allergen Reactions  . Metformin And Related Nausea And Vomiting  . Prednisone Nausea And Vomiting    IV Location/Drains/Wounds Patient Lines/Drains/Airways Status   Active Line/Drains/Airways    Name:   Placement date:   Placement time:   Site:   Days:   Peripheral IV 12/25/18 Left Forearm   12/25/18    2151    Forearm   1   NG/OG Tube Nasogastric 14 Fr. Right nare Xray;Aucultation Measured external length of tube   12/26/18    0400    Right nare   less than 1          Labs/Imaging Results for orders placed or  performed during the hospital encounter of 12/25/18 (from the past 48 hour(s))  CBC with Differential     Status: Abnormal   Collection Time: 12/25/18 10:54 PM  Result Value Ref Range   WBC 11.8 (H) 4.0 - 10.5 K/uL    Comment: WHITE COUNT CONFIRMED ON SMEAR   RBC 3.47 (L) 3.87 - 5.11 MIL/uL   Hemoglobin 10.4 (L) 12.0 - 15.0 g/dL   HCT 33.2 (L) 36.0 - 46.0 %   MCV 95.7 80.0 - 100.0 fL   MCH 30.0 26.0 - 34.0 pg   MCHC 31.3 30.0 - 36.0 g/dL   RDW 15.9 (H) 11.5 - 15.5 %   Platelets 309 150 - 400 K/uL   nRBC 0.0 0.0 - 0.2 %   Neutrophils Relative % 87 %   Neutro Abs 10.3 (H) 1.7 - 7.7 K/uL   Lymphocytes Relative 3 %   Lymphs Abs 0.4 (L) 0.7 - 4.0 K/uL   Monocytes Relative 9 %   Monocytes Absolute 1.0 0.1 - 1.0 K/uL   Eosinophils Relative 0 %   Eosinophils Absolute 0.0 0.0 - 0.5 K/uL   Basophils Relative 0 %   Basophils Absolute 0.0 0.0 - 0.1 K/uL   WBC Morphology INCREASED BANDS (>20%  BANDS)     Comment: MILD LEFT SHIFT (1-5% METAS, OCC MYELO, OCC BANDS)   Immature Granulocytes 1 %   Abs Immature Granulocytes 0.08 (H) 0.00 - 0.07 K/uL    Comment: Performed at [facility], [place] [address] [place], [place] [address]  Comprehensive metabolic panel     Status: Abnormal   Collection Time: 12/25/18 10:54 PM  Result Value Ref Range   Sodium 134 (L) 135 - 145 mmol/L   Potassium 4.3 3.5 - 5.1 mmol/L   Chloride 98 98 - 111 mmol/L   CO2 21 (L) 22 - 32 mmol/L   Glucose, Bld 271 (H) 70 - 99 mg/dL   BUN 112 (H) 8 - 23 mg/dL    Comment: RESULTS CONFIRMED BY MANUAL DILUTION   Creatinine, Ser 2.32 (H) 0.44 - 1.00 mg/dL   Calcium 7.1 (L) 8.9 - 10.3 mg/dL   Total Protein 5.3 (L) 6.5 - 8.1 g/dL   Albumin 2.7 (L) 3.5 - 5.0 g/dL   AST 24 15 - 41 U/L   ALT 21 0 - 44 U/L   Alkaline Phosphatase 73 38 - 126 U/L   Total Bilirubin 0.6 0.3 - 1.2 mg/dL   GFR calc non Af Amer 20 (L) >60 mL/min   GFR calc Af Amer 24 (L) >60 mL/min   Anion gap 15 5 - 15    Comment: Performed at  [facility], [place] [address] [place], [place] [address]  Lipase, blood     Status: None   Collection Time: 12/25/18 10:54 PM  Result Value Ref Range   Lipase 24 11 - 51 U/L    Comment: Performed at [facility], [place] [address], [place], [place] [address]  CBG monitoring, ED     Status: Abnormal   Collection Time: 12/26/18  5:13 AM  Result Value Ref Range   Glucose-Capillary 170 (H) 70 - 99 mg/dL  Basic metabolic panel     Status: Abnormal   Collection Time: 12/26/18  7:02 AM  Result Value Ref Range   Sodium 137 135 - 145 mmol/L   Potassium 4.1 3.5 - 5.1 mmol/L   Chloride 103 98 - 111 mmol/L   CO2 22 22 - 32 mmol/L   Glucose, Bld 181 (H) 70 - 99 mg/dL   BUN 119 (H) 8 - 23 mg/dL    Comment: RESULTS CONFIRMED BY MANUAL DILUTION   Creatinine, Ser 2.26 (H) 0.44 - 1.00 mg/dL   Calcium 6.6 (L) 8.9 - 10.3 mg/dL   GFR calc non Af Amer 21 (L) >60 mL/min   GFR calc Af Amer 24 (L) >60 mL/min   Anion gap 12 5 - 15    Comment: Performed at [facility], [place] [address], [place], [place] [address]  CBC     Status: Abnormal   Collection Time: 12/26/18  7:02 AM  Result Value Ref Range   WBC 13.2 (H) 4.0 - 10.5 K/uL   RBC 3.37 (L) 3.87 - 5.11 MIL/uL   Hemoglobin 10.1 (L) 12.0 - 15.0 g/dL   HCT 32.6 (L) 36.0 - 46.0 %   MCV 96.7 80.0 - 100.0 fL   MCH 30.0 26.0 - 34.0 pg   MCHC 31.0 30.0 - 36.0 g/dL   RDW 15.9 (H) 11.5 - 15.5 %   Platelets 308 150 - 400 K/uL   nRBC 0.0 0.0 - 0.2 %    Comment: Performed at [facility], [place] [address] [place], [place]  [address]   Ct Abdomen Pelvis Wo Contrast  Result Date: 12/26/2018 CLINICAL DATA:  Abdominal pain, known history of colon carcinoma with liver metastatic disease EXAM: CT ABDOMEN AND PELVIS WITHOUT CONTRAST TECHNIQUE: Multidetector CT imaging of the abdomen and pelvis was performed following the standard protocol without IV contrast. COMPARISON:  MRI from 12/07/2018 and CT  from 11/01/2018 FINDINGS: Lower chest: No acute abnormality. Hepatobiliary: Somewhat limited due to lack of IV contrast. Hypodense lesion is again seen in the caudate lobe consistent with the known metastatic disease. Gallbladder is well distended with multiple gallstones within. No biliary ductal dilatation is seen. Density is noted within the distal common bile duct consistent with common bile duct stone similar to that seen on prior MRI. Pancreas: Unremarkable. No pancreatic ductal dilatation or surrounding inflammatory changes. Spleen: Normal in size without focal abnormality. Adrenals/Urinary Tract: Right adrenal gland is within normal limits. Stable left adrenal lesion is seen shown to represent adenoma on prior MRI. The kidneys are within normal limits bilaterally. No renal calculi or obstructive changes are seen. The bladder is partially decompressed. Stomach/Bowel: Scattered diverticular change of the colon is noted. The transverse and ascending colon are dilated and fluid-filled. There is a caliber change identified at the mid descending colon. This would correspond with the known colonic mass seen on prior colonoscopy. Additionally there is a mass lesion within lumen of the sigmoid colon similar to that seen on prior CT examination and colonoscopy. Small-bowel is significantly distended with fluid and air consistent with the proximal descending colon obstructing lesion. Dilatation extends from the fourth portion of the duodenum to the terminal ileum. Vascular/Lymphatic: Aortic atherosclerosis. No enlarged abdominal or pelvic lymph nodes. Reproductive: Uterus and bilateral adnexa are unremarkable. Other: No abdominal wall hernia or abnormality. No abdominopelvic ascites. Musculoskeletal: Degenerative changes of lumbar spine. IMPRESSION: Changes consistent with an obstructing lesion in the proximal descending colon consistent with the known history of colon carcinoma. The proximal colon as well as the  entire small bowel is dilated secondary to the obstructive change. Second intraluminal mass within the sigmoid colon consistent with prior colonoscopy findings. Stable left adrenal lesion shown to represent adenoma on recent MRI. Hepatic metastatic disease similar to the prior MRI. Cholelithiasis without complicating factors. Electronically Signed   By: Inez Catalina M.D.   On: 12/26/2018 01:43   Dg Abdomen 1 View  Result Date: 12/26/2018 CLINICAL DATA:  Nasogastric tube placement EXAM: ABDOMEN - 1 VIEW COMPARISON:  Abdominal CT earlier today FINDINGS: Nasogastric tube tip at the distal stomach. Diffuse gaseous bowel distention in this patient with left colonic obstruction by CT. IMPRESSION: 1. Nasogastric tube with tip at the distal stomach. 2. Known descending colonic obstruction. Electronically Signed   By: Monte Fantasia M.D.   On: 12/26/2018 04:18   None  Pending Labs Unresulted Labs (From admission, onward)   None      Vitals/Pain Today's Vitals   12/26/18 0404 12/26/18 0500 12/26/18 0700 12/26/18 0730  BP:  (!) 121/59 (!) 119/52 (!) 118/56  Pulse:  97 60 98  Resp:  18 17 17   Temp:      TempSrc:      SpO2:  91% 93% 92%  Weight:      Height:      PainSc: 3        Isolation Precautions No active isolations  Medications Medications  insulin aspart (novoLOG) injection 0-9 Units (2 Units Subcutaneous Given 12/26/18 0524)  0.9 %  sodium chloride infusion ( Intravenous New Bag/Given  12/26/18 0526)  ondansetron (ZOFRAN) tablet 4 mg (has no administration in time range)    Or  ondansetron (ZOFRAN) injection 4 mg (has no administration in time range)  morphine 2 MG/ML injection 2 mg (2 mg Intravenous Given 12/26/18 0701)  morphine 4 MG/ML injection 4 mg (4 mg Intravenous Given 12/25/18 2258)  sodium chloride 0.9 % bolus 1,000 mL (0 mLs Intravenous Stopped 12/25/18 2358)  ondansetron (ZOFRAN) injection 4 mg (4 mg Intravenous Given 12/25/18 2258)  sodium chloride 0.9 % bolus 1,000 mL (0  mLs Intravenous Stopped 12/26/18 0205)  morphine 4 MG/ML injection 4 mg (4 mg Intravenous Given 12/26/18 0321)    Mobility walks with person assist

## 2018-12-26 NOTE — Progress Notes (Signed)
Seen and examined and evaluated this patient independently on my partner Dr. Shanon Brow  74 year old Caucasian female Newly diagnosed colon cancer 10/25/2018 after colonoscopy with metastases started on FOLFOX every 2 weekly 12/04/2018 Iron deficiency anemia on iron sucrose Diabetes mellitus type 2 HTN CKD Atrial fibrillation diagnosed 04/2018 [name] score about 3 on anticoagulation  Presented to emergency room with mention to complaints and probable new obstructing mass in addition to AKI Baseline creatinine went from 1.3-2.39  General surgery consulted  BP (!) 118/56   Pulse 98   Temp 98.1 F (36.7 C) (Oral)   Resp 17   Ht 5\' 3"  (1.6 m)   Wt 79.4 kg   SpO2 92%   BMI 31.00 kg/m  On exam Obese Caucasian female in some distress slightly sleepy S1-S2 no murmur Abdomen is distended obese she has an NG tube in place No lower extremity edema has a tattoo on the left foot  Assessment/plan Colon cancer with metastases-will need definitive palliative surgery hemicolectomy etc. as per general surgery who have agreed to assume care of her subsequent to surgery I will see her briefly in the morning AKI continue fluid resuscitation and monitor We will let Dr. Burr Medico know patient is admitted

## 2018-12-26 NOTE — Consult Note (Signed)
Reason for Consult:abd distension Referring Physician: Dr. Francella Solian is an 73 y.o. female.  HPI: The patient is a 73 year old white female who presents with significant abdominal distention that started earlier yesterday.  She apparently has known colon cancer with a smaller cancer in her sigmoid colon and a larger near obstructing colon cancer at the splenic flexure.  She has been receiving chemotherapy for this.  Her last dose was 1 week ago.  Since yesterday she has been more distended with some nausea and vomiting.  She states that she did have a small bowel movement yesterday.  She came to the emergency department where a CT scan shows an obstructing cancer in the splenic flexure of the colon with metastatic disease to the liver.  She is also on Eliquis for atrial fibrillation and as best she can remember she took her medicines yesterday  Past Medical History:  Diagnosis Date  . Arthritis   . Cancer ([place])   . Diabetes mellitus without complication ([place])   . Hypertension     Past Surgical History:  Procedure Laterality Date  . IR IMAGING GUIDED PORT INSERTION  11/20/2018    Family History  Problem Relation Age of Onset  . Heart attack Mother   . Heart attack Father     Social History:  reports that she has never smoked. She has never used smokeless tobacco. She reports that she does not drink alcohol or use drugs.  Allergies:  Allergies  Allergen Reactions  . Metformin And Related Nausea And Vomiting  . Prednisone Nausea And Vomiting    Medications: I have reviewed the patient's current medications.  Results for orders placed or performed during the hospital encounter of 12/25/18 (from the past 48 hour(s))  CBC with Differential     Status: Abnormal   Collection Time: 12/25/18 10:54 PM  Result Value Ref Range   WBC 11.8 (H) 4.0 - 10.5 K/uL    Comment: WHITE COUNT CONFIRMED ON SMEAR   RBC 3.47 (L) 3.87 - 5.11 MIL/uL   Hemoglobin 10.4 (L) 12.0 - 15.0 g/dL    HCT 33.2 (L) 36.0 - 46.0 %   MCV 95.7 80.0 - 100.0 fL   MCH 30.0 26.0 - 34.0 pg   MCHC 31.3 30.0 - 36.0 g/dL   RDW 15.9 (H) 11.5 - 15.5 %   Platelets 309 150 - 400 K/uL   nRBC 0.0 0.0 - 0.2 %   Neutrophils Relative % 87 %   Neutro Abs 10.3 (H) 1.7 - 7.7 K/uL   Lymphocytes Relative 3 %   Lymphs Abs 0.4 (L) 0.7 - 4.0 K/uL   Monocytes Relative 9 %   Monocytes Absolute 1.0 0.1 - 1.0 K/uL   Eosinophils Relative 0 %   Eosinophils Absolute 0.0 0.0 - 0.5 K/uL   Basophils Relative 0 %   Basophils Absolute 0.0 0.0 - 0.1 K/uL   WBC Morphology INCREASED BANDS (>20% BANDS)     Comment: MILD LEFT SHIFT (1-5% METAS, OCC MYELO, OCC BANDS)   Immature Granulocytes 1 %   Abs Immature Granulocytes 0.08 (H) 0.00 - 0.07 K/uL    Comment: Performed at [facility], [place] [address] [place], [place] [address]  Comprehensive metabolic panel     Status: Abnormal   Collection Time: 12/25/18 10:54 PM  Result Value Ref Range   Sodium 134 (L) 135 - 145 mmol/L   Potassium 4.3 3.5 - 5.1 mmol/L   Chloride 98 98 - 111 mmol/L  CO2 21 (L) 22 - 32 mmol/L   Glucose, Bld 271 (H) 70 - 99 mg/dL   BUN 112 (H) 8 - 23 mg/dL    Comment: RESULTS CONFIRMED BY MANUAL DILUTION   Creatinine, Ser 2.32 (H) 0.44 - 1.00 mg/dL   Calcium 7.1 (L) 8.9 - 10.3 mg/dL   Total Protein 5.3 (L) 6.5 - 8.1 g/dL   Albumin 2.7 (L) 3.5 - 5.0 g/dL   AST 24 15 - 41 U/L   ALT 21 0 - 44 U/L   Alkaline Phosphatase 73 38 - 126 U/L   Total Bilirubin 0.6 0.3 - 1.2 mg/dL   GFR calc non Af Amer 20 (L) >60 mL/min   GFR calc Af Amer 24 (L) >60 mL/min   Anion gap 15 5 - 15    Comment: Performed at [facility], [place] [address], [place], [place] [address]  Lipase, blood     Status: None   Collection Time: 12/25/18 10:54 PM  Result Value Ref Range   Lipase 24 11 - 51 U/L    Comment: Performed at [facility], [place] [address] [place], [place] [address]    Ct Abdomen Pelvis Wo  Contrast  Result Date: 12/26/2018 CLINICAL DATA:  Abdominal pain, known history of colon carcinoma with liver metastatic disease EXAM: CT ABDOMEN AND PELVIS WITHOUT CONTRAST TECHNIQUE: Multidetector CT imaging of the abdomen and pelvis was performed following the standard protocol without IV contrast. COMPARISON:  MRI from 12/07/2018 and CT from 11/01/2018 FINDINGS: Lower chest: No acute abnormality. Hepatobiliary: Somewhat limited due to lack of IV contrast. Hypodense lesion is again seen in the caudate lobe consistent with the known metastatic disease. Gallbladder is well distended with multiple gallstones within. No biliary ductal dilatation is seen. Density is noted within the distal common bile duct consistent with common bile duct stone similar to that seen on prior MRI. Pancreas: Unremarkable. No pancreatic ductal dilatation or surrounding inflammatory changes. Spleen: Normal in size without focal abnormality. Adrenals/Urinary Tract: Right adrenal gland is within normal limits. Stable left adrenal lesion is seen shown to represent adenoma on prior MRI. The kidneys are within normal limits bilaterally. No renal calculi or obstructive changes are seen. The bladder is partially decompressed. Stomach/Bowel: Scattered diverticular change of the colon is noted. The transverse and ascending colon are dilated and fluid-filled. There is a caliber change identified at the mid descending colon. This would correspond with the known colonic mass seen on prior colonoscopy. Additionally there is a mass lesion within lumen of the sigmoid colon similar to that seen on prior CT examination and colonoscopy. Small-bowel is significantly distended with fluid and air consistent with the proximal descending colon obstructing lesion. Dilatation extends from the fourth portion of the duodenum to the terminal ileum. Vascular/Lymphatic: Aortic atherosclerosis. No enlarged abdominal or pelvic lymph nodes. Reproductive: Uterus and  bilateral adnexa are unremarkable. Other: No abdominal wall hernia or abnormality. No abdominopelvic ascites. Musculoskeletal: Degenerative changes of lumbar spine. IMPRESSION: Changes consistent with an obstructing lesion in the proximal descending colon consistent with the known history of colon carcinoma. The proximal colon as well as the entire small bowel is dilated secondary to the obstructive change. Second intraluminal mass within the sigmoid colon consistent with prior colonoscopy findings. Stable left adrenal lesion shown to represent adenoma on recent MRI. Hepatic metastatic disease similar to the prior MRI. Cholelithiasis without complicating factors. Electronically Signed   By: Inez Catalina M.D.   On: 12/26/2018 01:43  Review of Systems  Constitutional: Negative.   HENT: Negative.   Eyes: Negative.   Respiratory: Positive for shortness of breath.   Cardiovascular: Negative.   Gastrointestinal: Positive for abdominal pain, constipation, nausea and vomiting.  Genitourinary: Negative.   Musculoskeletal: Negative.   Skin: Negative.   Neurological: Negative.   Endo/Heme/Allergies: Bruises/bleeds easily.  Psychiatric/Behavioral: Negative.    Blood pressure 112/63, pulse (!) 115, temperature 98.1 F (36.7 C), temperature source Oral, resp. rate 17, height 5\' 3"  (1.6 m), weight 79.4 kg, SpO2 93 %. Physical Exam  Constitutional: She is oriented to person, place, and time. She appears well-developed and well-nourished.  Appears uncomfortable  HENT:  Head: Normocephalic and atraumatic.  Mouth/Throat: No oropharyngeal exudate.  Eyes: Pupils are equal, round, and reactive to light. Conjunctivae and EOM are normal.  Neck: Normal range of motion. Neck supple.  Cardiovascular: Intact distal pulses.  Irregular rate and rhythm  Respiratory: Effort normal and breath sounds normal. No stridor.  Slight increased work of breathing  GI:  Very distended with moderate tenderness   Musculoskeletal: Normal range of motion.        General: Edema present. No tenderness.  Neurological: She is alert and oriented to person, place, and time. Coordination normal.  Skin: Skin is warm and dry. No rash noted.  Psychiatric: She has a normal mood and affect. Her behavior is normal. Thought content normal.  Poor historian    Assessment/Plan: The patient appears to have an obstructing colon cancer near the splenic flexure as well as a second colon cancer in the sigmoid colon.  She also has evidence of metastatic disease in the liver.  Given this finding and the fact that she is on blood thinners for atrial fibrillation I think she warrants a medical admission to the hospital.  She will need IV hydration and resuscitation given her acute renal dysfunction likely secondary to dehydration.  She will also need to be seen by oncology to give their opinion on timing of the surgery given the recent dosing of chemotherapy.  In the meantime she will need an NG tube to help with decompression.  Given all of her acute problems if she is able to have surgery she will likely need an extended left colectomy with an end colostomy.  We will follow closely with you to help with timing of the surgery.  Autumn Messing III 12/26/2018, 3:30 AM

## 2018-12-26 NOTE — Consult Note (Addendum)
[facility] [place] Nurse requested for preoperative stoma site marking  Discussed surgical procedure and stoma creation with patient. Explained role of the [place] nurse team.  Provided the patient with educational booklet and provided samples of pouching options.  Pt did not ask any questions and no family members were present.  Pt's abd is extremely distended and tight and she is nauseated and in pain.  Unable to assess abd in a sitting or standing position, but the distention is masking any possible creases which might have previously been present, so I marked higher on the abd to attempt to avoid this situation. Mark placed in the patient's visual field, within the rectus muscle.   Marked for colostomy in the LLQ  __7__ cm to the left of the umbilicus and __0__RV above the umbilicus.  Marked for ileostomy in the RLQ  __7__cm to the right of the umbilicus and  __5 cm above the umbilicus.  Patient's abdomen cleansed with CHG wipes at site markings, allowed to air dry prior to marking. Did not cover the mark, since surgery is planned to occur soon. [place] Nurse team will follow up with patient after surgery for continued ostomy care and teaching.  Julien Girt MSN, RN, [place], [place], [place]

## 2018-12-26 NOTE — ED Notes (Signed)
Patient requesting pain medication. EDP made aware of request.

## 2018-12-26 NOTE — Progress Notes (Signed)
Pt bp 90/45 , md notified

## 2018-12-26 NOTE — Progress Notes (Signed)
Oncology brief note  Pt is well-known to me, under my care for her metastatic colon cancer, on palliative chemotherapy FOLFOX, s/p 2 cycles with last cycle on 2/5. She previously saw Dr. Marcello Moores also. She was admitted last night for bowel obstruction secondary to colon mass. I was not able to see her today, but will see her tomorrow morning before my clinic. I have reviewed her scan, lab and notes. I think she needs urgent left hemicolectomy, her Eliquis has been held. Of note, she has never received Avastin.  If possible, I hope Dr. Marlou Starks can explore her liver also during surgery, to see any obvious evidence of liver metastasis, which were highly suspicious on CT and MRI but previous biopsy was negative.    Truitt Merle  12/26/2018

## 2018-12-26 NOTE — Progress Notes (Signed)
CC: Abdominal pain  Subjective: Patient just got transferred to her room.  NG is in place although is not draining much.  Is a very small tube.  The drainage that is there is dark brown, almost feculent in appearance.  She is extremely tender and essentially miserable.  Objective: Vital signs in last 24 hours: Temp:  [97.6 F (36.4 C)-98.1 F (36.7 C)] 97.6 F (36.4 C) (02/13 0823) Pulse Rate:  [60-115] 89 (02/13 0823) Resp:  [15-20] 15 (02/13 0823) BP: (101-121)/(49-68) 120/54 (02/13 0823) SpO2:  [91 %-96 %] 95 % (02/13 0823) Weight:  [79.4 kg] 79.4 kg (02/12 2154)    Intake/Output from previous day: 02/12 0701 - 02/13 0700 In: 2000 [IV Piggyback:2000] Out: -  Intake/Output this shift: Total I/O In: -  Out: 1100 [Emesis/NG output:1100]  General appearance: alert, cooperative and She is extremely uncomfortable and her abdomen is extremely tender. Resp: clear to auscultation bilaterally Cardio: Irregular GI: She is extremely distended and tender.  Just laying the stethoscope on her abdomen to listen makes her flinch.  No bowel sounds. Extremities: Trace edema both lower extremities, she says this is chronic.  Lab Results:  Recent Labs    12/25/18 2254 12/26/18 0702  WBC 11.8* 13.2*  HGB 10.4* 10.1*  HCT 33.2* 32.6*  PLT 309 308    BMET Recent Labs    12/25/18 2254 12/26/18 0702  NA 134* 137  K 4.3 4.1  CL 98 103  CO2 21* 22  GLUCOSE 271* 181*  BUN 112* 119*  CREATININE 2.32* 2.26*  CALCIUM 7.1* 6.6*   PT/INR No results for input(s): LABPROT, INR in the last 72 hours.  Recent Labs  Lab 12/21/18 2055 12/22/18 1322 12/25/18 2254  AST 14*  --  24  ALT 38  --  21  ALKPHOS 84  --  73  BILITOT 0.6  --  0.6  PROT 5.5*  --  5.3*  ALBUMIN 3.0* 3.1* 2.7*     Lipase     Component Value Date/Time   LIPASE 24 12/25/2018 2254     Medications: . insulin aspart  0-9 Units Subcutaneous Q4H   . sodium chloride 100 mL/hr at 12/26/18 0526  .  acetaminophen     10/30/2018 Initial Biopsy     Final Diagnosis: 10/30/18 1. Small intestine-Duedenum, Biospy:              Benign 2.Stomach-Antrum, Biospy:              Chronic inactive Gastritis 3. Large intestine-Sigmoid Colon, Polyp:              Tubular adenomas (2) 4. Large interstine-Descending Colon, Polyp:              Tubular adenoma with high grade dysplasia, suspicious for invasion.  5. Large intestine-transverse colon, Polyp:              Tubulovilous Adenomas (3).  6. Large Intestine-Cecum, Polp:              Tubular adenoma  7. Large Intestine-Ascending Colon, Polyp:             Tubulovilous Adenoma 8. Large intestine, Biopsy, Mass 55cm:             Invasive moderately differentiated adenocarcinoma.  9. Large intestine, biopsy, Mass 20cm:              Tubulovilous Adenoma     Assessment/Plan Recent admission for chest pain Atrial fibrillation on Eliquis -  last dose 12/25/2018 Hypertension Acute on chronic kidney disease stage III Type 2 diabetes Hx depression Anemia  Colon obstruction/ in   moderately differentiated adenocarcinomaof left colon with obstructing mass near the splenic flexure as well as a second colon cancer in the sigmoid colon - currently on chemotherapy - last dose 12/18/18? Liver metastasis   FEN: N.p.o./IV fluids ID:  None DVT: Eliquis - last dose 12/25/18  Plan: Contacting Dr. Burr Medico and get her input,   patient is extremely tender and will need resection.  With her current distention and discomfort there is no way order to be able to prep her.  She will most likely require a Hartman's procedure.   Dr. Hassell Done has seen the patient and we will follow closely.  If she were not on Eliquis we would most likely take her to the OR today.  Possible surgery tomorrow after we get Dr. Ernestina Penna input.        LOS: 0 days    Irvin Lizama 12/26/2018 [phone]

## 2018-12-26 NOTE — ED Provider Notes (Signed)
Nursing notes and vitals signs, including pulse oximetry, reviewed.  Summary of this visit's results, reviewed by myself:  EKG:  EKG Interpretation  Date/Time:    Ventricular Rate:    PR Interval:    QRS Duration:   QT Interval:    QTC Calculation:   R Axis:     Text Interpretation:         Labs:  Results for orders placed or performed during the hospital encounter of 12/25/18 (from the past 24 hour(s))  CBC with Differential     Status: Abnormal   Collection Time: 12/25/18 10:54 PM  Result Value Ref Range   WBC 11.8 (H) 4.0 - 10.5 K/uL   RBC 3.47 (L) 3.87 - 5.11 MIL/uL   Hemoglobin 10.4 (L) 12.0 - 15.0 g/dL   HCT 33.2 (L) 36.0 - 46.0 %   MCV 95.7 80.0 - 100.0 fL   MCH 30.0 26.0 - 34.0 pg   MCHC 31.3 30.0 - 36.0 g/dL   RDW 15.9 (H) 11.5 - 15.5 %   Platelets 309 150 - 400 K/uL   nRBC 0.0 0.0 - 0.2 %   Neutrophils Relative % 87 %   Neutro Abs 10.3 (H) 1.7 - 7.7 K/uL   Lymphocytes Relative 3 %   Lymphs Abs 0.4 (L) 0.7 - 4.0 K/uL   Monocytes Relative 9 %   Monocytes Absolute 1.0 0.1 - 1.0 K/uL   Eosinophils Relative 0 %   Eosinophils Absolute 0.0 0.0 - 0.5 K/uL   Basophils Relative 0 %   Basophils Absolute 0.0 0.0 - 0.1 K/uL   WBC Morphology INCREASED BANDS (>20% BANDS)    Immature Granulocytes 1 %   Abs Immature Granulocytes 0.08 (H) 0.00 - 0.07 K/uL  Comprehensive metabolic panel     Status: Abnormal   Collection Time: 12/25/18 10:54 PM  Result Value Ref Range   Sodium 134 (L) 135 - 145 mmol/L   Potassium 4.3 3.5 - 5.1 mmol/L   Chloride 98 98 - 111 mmol/L   CO2 21 (L) 22 - 32 mmol/L   Glucose, Bld 271 (H) 70 - 99 mg/dL   BUN 112 (H) 8 - 23 mg/dL   Creatinine, Ser 2.32 (H) 0.44 - 1.00 mg/dL   Calcium 7.1 (L) 8.9 - 10.3 mg/dL   Total Protein 5.3 (L) 6.5 - 8.1 g/dL   Albumin 2.7 (L) 3.5 - 5.0 g/dL   AST 24 15 - 41 U/L   ALT 21 0 - 44 U/L   Alkaline Phosphatase 73 38 - 126 U/L   Total Bilirubin 0.6 0.3 - 1.2 mg/dL   GFR calc non Af Amer 20 (L) >60 mL/min    GFR calc Af Amer 24 (L) >60 mL/min   Anion gap 15 5 - 15  Lipase, blood     Status: None   Collection Time: 12/25/18 10:54 PM  Result Value Ref Range   Lipase 24 11 - 51 U/L    Imaging Studies: Ct Abdomen Pelvis Wo Contrast  Result Date: 12/26/2018 CLINICAL DATA:  Abdominal pain, known history of colon carcinoma with liver metastatic disease EXAM: CT ABDOMEN AND PELVIS WITHOUT CONTRAST TECHNIQUE: Multidetector CT imaging of the abdomen and pelvis was performed following the standard protocol without IV contrast. COMPARISON:  MRI from 12/07/2018 and CT from 11/01/2018 FINDINGS: Lower chest: No acute abnormality. Hepatobiliary: Somewhat limited due to lack of IV contrast. Hypodense lesion is again seen in the caudate lobe consistent with the known metastatic disease. Gallbladder is well distended with  multiple gallstones within. No biliary ductal dilatation is seen. Density is noted within the distal common bile duct consistent with common bile duct stone similar to that seen on prior MRI. Pancreas: Unremarkable. No pancreatic ductal dilatation or surrounding inflammatory changes. Spleen: Normal in size without focal abnormality. Adrenals/Urinary Tract: Right adrenal gland is within normal limits. Stable left adrenal lesion is seen shown to represent adenoma on prior MRI. The kidneys are within normal limits bilaterally. No renal calculi or obstructive changes are seen. The bladder is partially decompressed. Stomach/Bowel: Scattered diverticular change of the colon is noted. The transverse and ascending colon are dilated and fluid-filled. There is a caliber change identified at the mid descending colon. This would correspond with the known colonic mass seen on prior colonoscopy. Additionally there is a mass lesion within lumen of the sigmoid colon similar to that seen on prior CT examination and colonoscopy. Small-bowel is significantly distended with fluid and air consistent with the proximal descending  colon obstructing lesion. Dilatation extends from the fourth portion of the duodenum to the terminal ileum. Vascular/Lymphatic: Aortic atherosclerosis. No enlarged abdominal or pelvic lymph nodes. Reproductive: Uterus and bilateral adnexa are unremarkable. Other: No abdominal wall hernia or abnormality. No abdominopelvic ascites. Musculoskeletal: Degenerative changes of lumbar spine. IMPRESSION: Changes consistent with an obstructing lesion in the proximal descending colon consistent with the known history of colon carcinoma. The proximal colon as well as the entire small bowel is dilated secondary to the obstructive change. Second intraluminal mass within the sigmoid colon consistent with prior colonoscopy findings. Stable left adrenal lesion shown to represent adenoma on recent MRI. Hepatic metastatic disease similar to the prior MRI. Cholelithiasis without complicating factors. Electronically Signed   By: Inez Catalina M.D.   On: 12/26/2018 01:43   2:18 AM Abdomen distended, tense.  Patient advised of CT findings consistent with colonic obstruction.  Dr. Marlou Starks of [facility] to see patient in ED.  3:53 AM Dr. Marlou Starks request admission by hospitalist service.  NG tube ordered.     Xylan Sheils, Jenny Reichmann, MD 12/26/18 [phone]

## 2018-12-26 NOTE — ED Notes (Signed)
Patient unable to drink Omnipaque, stating "when I drink it, it makes my stomach hurt."

## 2018-12-26 NOTE — H&P (Signed)
History and Physical    Cassandra Allen WER:[id number] DOB: 1946/03/06 DOA: 12/25/2018  PCP: [name], MD  Patient coming from: Home  Chief Complaint: Abdominal pain  HPI: Cassandra Allen is a 73 y.o. female with medical history significant of chronic A. fib on Eliquis, diabetes, advanced colon cancer diagnosed the end of last year who is undergoing chemotherapy, hypertension comes in with worsening right lower quadrant abdominal pain with nausea and vomiting and bloating for the last 5 days.  Patient denies any fevers.  She has been passing some stool but not since yesterday.  Patient found to have a bowel obstruction from the colon mass and referred for admission for such.  She is also found to have acute kidney injury with a creatinine bump up to 2.3 baseline is 1.1.  Dr. Marlou Starks with general surgery has seen the patient and is requesting medical admission in order to optimize her medically for a likely resection in the next couple of days.  Patient  just received NG tube placement.  She is requesting a call her son and update him.  Review of Systems: As per HPI otherwise 10 point review of systems negative.   Past Medical History:  Diagnosis Date  . Arthritis   . Cancer ([place])   . Diabetes mellitus without complication ([place])   . Hypertension     Past Surgical History:  Procedure Laterality Date  . IR IMAGING GUIDED PORT INSERTION  11/20/2018     reports that she has never smoked. She has never used smokeless tobacco. She reports that she does not drink alcohol or use drugs.  Allergies  Allergen Reactions  . Metformin And Related Nausea And Vomiting  . Prednisone Nausea And Vomiting    Family History  Problem Relation Age of Onset  . Heart attack Mother   . Heart attack Father     Prior to Admission medications   Medication Sig Start Date End Date Taking? Authorizing Provider  apixaban (ELIQUIS) 5 MG TABS tablet TAKE 1 TABLET (5 MG TOTAL) BY MOUTH 2 (TWO)  TIMES DAILY. Patient taking differently: Take 5 mg by mouth 2 (two) times daily.  09/19/18  Yes [name], NP  diltiazem (CARDIZEM CD) 120 MG 24 hr capsule Take 1 capsule (120 mg total) by mouth 2 (two) times daily. 11/15/18  Yes [name], NP  furosemide (LASIX) 20 MG tablet Lasix 20 mg po daily PRN for weight of greater than 3 lbs in a day or 5 lbs in a week 12/23/18  Yes Bonnell Public, MD  HYDROcodone-acetaminophen (NORCO) 7.5-325 MG tablet Take 1 tablet by mouth 2 (two) times daily as needed (pain).    Yes [provider]  insulin detemir (LEVEMIR) 100 UNIT/ML injection Inject 0.07 mLs (7 Units total) into the skin daily before breakfast. 12/24/18  Yes Ogbata, Babs Bertin, MD  liraglutide (VICTOZA) 18 MG/3ML SOPN Inject 1.8 mg into the skin daily before breakfast.    Yes [provider]  Multiple Vitamin (MULTIVITAMIN WITH MINERALS) TABS tablet Take 2 tablets by mouth daily at 12 noon.    Yes [provider]  oxybutynin (DITROPAN) 5 MG tablet Take 20 mg by mouth daily.   Yes [provider]  oxybutynin (DITROPAN-XL) 10 MG 24 hr tablet Take 10 mg by mouth daily. 12/16/18  Yes [provider]  sertraline (ZOLOFT) 100 MG tablet Take 100 mg by mouth daily.    Yes [provider]  simvastatin (ZOCOR) 20 MG tablet  Take 20 mg by mouth at bedtime.    Yes [provider]    Physical Exam: Vitals:   12/25/18 2330 12/26/18 0100 12/26/18 0200 12/26/18 0330  BP: 106/68 (!) 105/54 112/63 117/67  Pulse: 97 72 (!) 115 65  Resp: 19 18 17 17   Temp:      TempSrc:      SpO2: 96% 95% 93% 94%  Weight:      Height:          Constitutional: NAD, calm, comfortable Vitals:   12/25/18 2330 12/26/18 0100 12/26/18 0200 12/26/18 0330  BP: 106/68 (!) 105/54 112/63 117/67  Pulse: 97 72 (!) 115 65  Resp: 19 18 17 17   Temp:      TempSrc:      SpO2: 96% 95% 93% 94%  Weight:      Height:       Eyes: PERRL, lids and conjunctivae  normal ENMT: Mucous membranes are moist. Posterior pharynx clear of any exudate or lesions.Normal dentition.  Neck: normal, supple, no masses, no thyromegaly Respiratory: clear to auscultation bilaterally, no wheezing, no crackles. Normal respiratory effort. No accessory muscle use.  Cardiovascular: Regular rate and rhythm, no murmurs / rubs / gallops. No extremity edema. 2+ pedal pulses. No carotid bruits.  Abdomen: Diffuse tenderness, no masses palpated. No hepatosplenomegaly. Bowel sounds decreased with significant distention Musculoskeletal: no clubbing / cyanosis. No joint deformity upper and lower extremities. Good ROM, no contractures. Normal muscle tone.  Skin: no rashes, lesions, ulcers. No induration Neurologic: CN 2-12 grossly intact. Sensation intact, DTR normal. Strength 5/5 in all 4.  Psychiatric: Normal judgment and insight. Alert and oriented x 3. Normal mood.    Labs on Admission: I have personally reviewed following labs and imaging studies  CBC: Recent Labs  Lab 12/21/18 2055 12/25/18 2254  WBC 2.6* 11.8*  NEUTROABS  --  10.3*  HGB 9.1* 10.4*  HCT 30.7* 33.2*  MCV 97.2 95.7  PLT 202 419   Basic Metabolic Panel: Recent Labs  Lab 12/21/18 2055 12/22/18 1322 12/23/18 0423 12/25/18 2254  NA 140 141 141 134*  K 4.1 4.5 4.6 4.3  CL 102 107 108 98  CO2 25 25 25  21*  GLUCOSE 149* 114* 126* 271*  BUN 43* 35* 38* 112*  CREATININE 1.47* 1.19* 1.34* 2.32*  CALCIUM 8.7* 8.8* 8.1* 7.1*  MG  --  2.0  --   --   PHOS  --  3.6  --   --    GFR: Estimated Creatinine Clearance: 21.9 mL/min (A) (by C-G formula based on SCr of 2.32 mg/dL (H)). Liver Function Tests: Recent Labs  Lab 12/21/18 2055 12/22/18 1322 12/25/18 2254  AST 14*  --  24  ALT 38  --  21  ALKPHOS 84  --  73  BILITOT 0.6  --  0.6  PROT 5.5*  --  5.3*  ALBUMIN 3.0* 3.1* 2.7*   Recent Labs  Lab 12/25/18 2254  LIPASE 24   No results for input(s): AMMONIA in the last 168 hours. Coagulation  Profile: Recent Labs  Lab 12/22/18 0113  INR 1.30   Cardiac Enzymes: Recent Labs  Lab 12/22/18 0113 12/22/18 0639 12/22/18 1322 12/22/18 1949  TROPONINI 0.04* <0.03 <0.03 <0.03   BNP (last 3 results) No results for input(s): PROBNP in the last 8760 hours. HbA1C: No results for input(s): HGBA1C in the last 72 hours. CBG: Recent Labs  Lab 12/22/18 1231 12/22/18 1716 12/22/18 2054 12/23/18 0803 12/23/18 1121  GLUCAP  106* 186* 143* 133* 191*   Lipid Profile: No results for input(s): CHOL, HDL, LDLCALC, TRIG, CHOLHDL, LDLDIRECT in the last 72 hours. Thyroid Function Tests: No results for input(s): TSH, T4TOTAL, FREET4, T3FREE, THYROIDAB in the last 72 hours. Anemia Panel: No results for input(s): VITAMINB12, FOLATE, FERRITIN, TIBC, IRON, RETICCTPCT in the last 72 hours. Urine analysis:    Component Value Date/Time   COLORURINE YELLOW 03/25/2015 2317   APPEARANCEUR CLOUDY (A) 03/25/2015 2317   LABSPEC 1.014 03/25/2015 2317   PHURINE 5.0 03/25/2015 2317   GLUCOSEU NEGATIVE 03/25/2015 2317   HGBUR NEGATIVE 03/25/2015 2317   BILIRUBINUR NEGATIVE 03/25/2015 2317   KETONESUR NEGATIVE 03/25/2015 2317   PROTEINUR NEGATIVE 12/18/2018 1353   UROBILINOGEN 0.2 03/25/2015 2317   NITRITE POSITIVE (A) 03/25/2015 2317   LEUKOCYTESUR MODERATE (A) 03/25/2015 2317   Sepsis Labs: !!!!!!!!!!!!!!!!!!!!!!!!!!!!!!!!!!!!!!!!!!!! @LABRCNTIP (procalcitonin:4,lacticidven:4) )No results found for this or any previous visit (from the past 240 hour(s)).   Radiological Exams on Admission: Ct Abdomen Pelvis Wo Contrast  Result Date: 12/26/2018 CLINICAL DATA:  Abdominal pain, known history of colon carcinoma with liver metastatic disease EXAM: CT ABDOMEN AND PELVIS WITHOUT CONTRAST TECHNIQUE: Multidetector CT imaging of the abdomen and pelvis was performed following the standard protocol without IV contrast. COMPARISON:  MRI from 12/07/2018 and CT from 11/01/2018 FINDINGS: Lower chest: No acute  abnormality. Hepatobiliary: Somewhat limited due to lack of IV contrast. Hypodense lesion is again seen in the caudate lobe consistent with the known metastatic disease. Gallbladder is well distended with multiple gallstones within. No biliary ductal dilatation is seen. Density is noted within the distal common bile duct consistent with common bile duct stone similar to that seen on prior MRI. Pancreas: Unremarkable. No pancreatic ductal dilatation or surrounding inflammatory changes. Spleen: Normal in size without focal abnormality. Adrenals/Urinary Tract: Right adrenal gland is within normal limits. Stable left adrenal lesion is seen shown to represent adenoma on prior MRI. The kidneys are within normal limits bilaterally. No renal calculi or obstructive changes are seen. The bladder is partially decompressed. Stomach/Bowel: Scattered diverticular change of the colon is noted. The transverse and ascending colon are dilated and fluid-filled. There is a caliber change identified at the mid descending colon. This would correspond with the known colonic mass seen on prior colonoscopy. Additionally there is a mass lesion within lumen of the sigmoid colon similar to that seen on prior CT examination and colonoscopy. Small-bowel is significantly distended with fluid and air consistent with the proximal descending colon obstructing lesion. Dilatation extends from the fourth portion of the duodenum to the terminal ileum. Vascular/Lymphatic: Aortic atherosclerosis. No enlarged abdominal or pelvic lymph nodes. Reproductive: Uterus and bilateral adnexa are unremarkable. Other: No abdominal wall hernia or abnormality. No abdominopelvic ascites. Musculoskeletal: Degenerative changes of lumbar spine. IMPRESSION: Changes consistent with an obstructing lesion in the proximal descending colon consistent with the known history of colon carcinoma. The proximal colon as well as the entire small bowel is dilated secondary to the  obstructive change. Second intraluminal mass within the sigmoid colon consistent with prior colonoscopy findings. Stable left adrenal lesion shown to represent adenoma on recent MRI. Hepatic metastatic disease similar to the prior MRI. Cholelithiasis without complicating factors. Electronically Signed   By: Inez Catalina M.D.   On: 12/26/2018 01:43   Old chart reviewed Case discussed with Dr. Florina Ou in the ED  Assessment/Plan 73 year old female with metastatic colon cancer comes in with bowel obstruction from colon mass Principal Problem:   SBO ( bowel obstruction) (HCC)-conservative  treatment with decompression with NG tube at this time patient will likely need surgery.  Will optimize her medically provide some IV fluids make sure electrolytes are normal prior to surgical intervention.  Dr. Marlou Starks with general surgery following.  N.p.o. and hold Eliquis.  Active Problems:   Acute renal failure superimposed on stage 2 chronic kidney disease (HCC)-patient has been given 2 L of IV fluids we will continue IV fluids overnight.  Repeat BMP in the morning.  Secondary to dehydration.    Essential (primary) hypertension-hold blood pressure medicines at this time.    Diabetes mellitus without complication (HCC)-hold home medications including Lantus and place on sliding scale insulin    Cancer of left colon (HCC)-as above    Atrial fibrillation, chronic-holding Eliquis at this time    Anticoagulant long-term use-again holding Eliquis for near future surgery    DVT prophylaxis: SCDs Code Status: Full Family Communication: Called and updated son [name] on the phone Disposition Plan: Days Consults called: General surgery and will need to call her oncologist in the morning Admission status: Admission   Yenty Bloch A MD [facility]  If 7PM-7AM, please contact night-coverage [email or web address] Password [facility]  12/26/2018, 4:04 AM

## 2018-12-27 ENCOUNTER — Inpatient Hospital Stay (HOSPITAL_COMMUNITY): Payer: Medicare HMO | Admitting: Anesthesiology

## 2018-12-27 ENCOUNTER — Encounter (HOSPITAL_COMMUNITY): Payer: Medicare HMO

## 2018-12-27 ENCOUNTER — Other Ambulatory Visit: Payer: Self-pay

## 2018-12-27 ENCOUNTER — Encounter (HOSPITAL_COMMUNITY)
Admission: EM | Disposition: A | Discharge status: Skilled Nursing Facility | Payer: Self-pay | Source: Home / Self Care | Attending: Student

## 2018-12-27 ENCOUNTER — Encounter (HOSPITAL_COMMUNITY): Payer: Self-pay | Admitting: Surgery

## 2018-12-27 DIAGNOSIS — Z7901 Long term (current) use of anticoagulants: Secondary | ICD-10-CM

## 2018-12-27 DIAGNOSIS — C187 Malignant neoplasm of sigmoid colon: Secondary | ICD-10-CM

## 2018-12-27 DIAGNOSIS — D509 Iron deficiency anemia, unspecified: Secondary | ICD-10-CM

## 2018-12-27 DIAGNOSIS — N183 Chronic kidney disease, stage 3 unspecified: Secondary | ICD-10-CM

## 2018-12-27 DIAGNOSIS — C189 Malignant neoplasm of colon, unspecified: Secondary | ICD-10-CM | POA: Diagnosis present

## 2018-12-27 DIAGNOSIS — N179 Acute kidney failure, unspecified: Secondary | ICD-10-CM

## 2018-12-27 DIAGNOSIS — Z9049 Acquired absence of other specified parts of digestive tract: Secondary | ICD-10-CM

## 2018-12-27 DIAGNOSIS — K8020 Calculus of gallbladder without cholecystitis without obstruction: Secondary | ICD-10-CM | POA: Insufficient documentation

## 2018-12-27 DIAGNOSIS — C787 Secondary malignant neoplasm of liver and intrahepatic bile duct: Secondary | ICD-10-CM

## 2018-12-27 DIAGNOSIS — Z86010 Personal history of colonic polyps: Secondary | ICD-10-CM

## 2018-12-27 DIAGNOSIS — E860 Dehydration: Secondary | ICD-10-CM

## 2018-12-27 DIAGNOSIS — I5030 Unspecified diastolic (congestive) heart failure: Secondary | ICD-10-CM

## 2018-12-27 DIAGNOSIS — N189 Chronic kidney disease, unspecified: Secondary | ICD-10-CM

## 2018-12-27 DIAGNOSIS — K8050 Calculus of bile duct without cholangitis or cholecystitis without obstruction: Secondary | ICD-10-CM

## 2018-12-27 DIAGNOSIS — E669 Obesity, unspecified: Secondary | ICD-10-CM

## 2018-12-27 HISTORY — PX: LAPAROTOMY: SHX154

## 2018-12-27 HISTORY — DX: Obesity, unspecified: E66.9

## 2018-12-27 HISTORY — DX: Malignant neoplasm of sigmoid colon: C18.7

## 2018-12-27 LAB — GLUCOSE, CAPILLARY
GLUCOSE-CAPILLARY: 17 mg/dL — AB (ref 70–99)
Glucose-Capillary: 102 mg/dL — ABNORMAL HIGH (ref 70–99)
Glucose-Capillary: 113 mg/dL — ABNORMAL HIGH (ref 70–99)
Glucose-Capillary: 123 mg/dL — ABNORMAL HIGH (ref 70–99)
Glucose-Capillary: 142 mg/dL — ABNORMAL HIGH (ref 70–99)
Glucose-Capillary: 87 mg/dL (ref 70–99)
Glucose-Capillary: 98 mg/dL (ref 70–99)

## 2018-12-27 LAB — BASIC METABOLIC PANEL
ANION GAP: 18 — AB (ref 5–15)
BUN: 129 mg/dL — ABNORMAL HIGH (ref 8–23)
CO2: 21 mmol/L — ABNORMAL LOW (ref 22–32)
Calcium: 6.2 mg/dL — CL (ref 8.9–10.3)
Chloride: 100 mmol/L (ref 98–111)
Creatinine, Ser: 3.08 mg/dL — ABNORMAL HIGH (ref 0.44–1.00)
GFR calc Af Amer: 17 mL/min — ABNORMAL LOW (ref >60)
GFR, EST NON AFRICAN AMERICAN: 14 mL/min — AB (ref >60)
GLUCOSE: 104 mg/dL — AB (ref 70–99)
Potassium: 3.9 mmol/L (ref 3.5–5.1)
Sodium: 139 mmol/L (ref 135–145)

## 2018-12-27 LAB — CBC
HCT: 25.4 % — ABNORMAL LOW (ref 36.0–46.0)
HEMATOCRIT: 31.2 % — AB (ref 36.0–46.0)
HEMOGLOBIN: 9.4 g/dL — AB (ref 12.0–15.0)
Hemoglobin: 7.6 g/dL — ABNORMAL LOW (ref 12.0–15.0)
MCH: 29 pg (ref 26.0–34.0)
MCH: 29.6 pg (ref 26.0–34.0)
MCHC: 30.1 g/dL (ref 30.0–36.0)
MCHC: 29.9 g/dL — ABNORMAL LOW (ref 30.0–36.0)
MCV: 96.3 fL (ref 80.0–100.0)
MCV: 98.8 fL (ref 80.0–100.0)
PLATELETS: 284 10*3/uL (ref 150–400)
PLATELETS: 261 10*3/uL (ref 150–400)
RBC: 3.24 MIL/uL — AB (ref 3.87–5.11)
RBC: 2.57 MIL/uL — ABNORMAL LOW (ref 3.87–5.11)
RDW: 15.9 % — AB (ref 11.5–15.5)
RDW: 16 % — AB (ref 11.5–15.5)
WBC: 6.9 10*3/uL (ref 4.0–10.5)
WBC: 2.8 10*3/uL — AB (ref 4.0–10.5)
nRBC: 0 % (ref 0.0–0.2)
nRBC: 0.7 % — ABNORMAL HIGH (ref 0.0–0.2)

## 2018-12-27 LAB — PROTIME-INR
INR: 2.14
Prothrombin Time: 23.7 seconds — ABNORMAL HIGH (ref 11.4–15.2)

## 2018-12-27 LAB — GLUCOSE, RANDOM: Glucose, Bld: 134 mg/dL — ABNORMAL HIGH (ref 70–99)

## 2018-12-27 LAB — PREPARE RBC (CROSSMATCH)

## 2018-12-27 LAB — APTT: aPTT: 34 seconds (ref 24–36)

## 2018-12-27 LAB — BRAIN NATRIURETIC PEPTIDE: B Natriuretic Peptide: 114.3 pg/mL — ABNORMAL HIGH (ref 0.0–100.0)

## 2018-12-27 LAB — TROPONIN I: Troponin I: <0.03 ng/mL (ref <0.03)

## 2018-12-27 LAB — PREALBUMIN: Prealbumin: 10 mg/dL — ABNORMAL LOW (ref 18–38)

## 2018-12-27 SURGERY — LAPAROTOMY, EXPLORATORY
Anesthesia: General | Site: Abdomen

## 2018-12-27 MED ORDER — ORAL CARE MOUTH RINSE
15.0000 mL | Freq: Two times a day (BID) | OROMUCOSAL | Status: DC
  Administered 2018-12-28 – 2019-01-10 (×13): 15 mL via OROMUCOSAL

## 2018-12-27 MED ORDER — MENTHOL 3 MG MT LOZG
1.0000 | LOZENGE | OROMUCOSAL | Status: DC | PRN
  Filled 2018-12-27: qty 9

## 2018-12-27 MED ORDER — ETOMIDATE 2 MG/ML IV SOLN
INTRAVENOUS | Status: DC | PRN
  Administered 2018-12-27: 10 mg via INTRAVENOUS

## 2018-12-27 MED ORDER — SODIUM CHLORIDE 0.9 % IV SOLN
2.0000 g | Freq: Two times a day (BID) | INTRAVENOUS | Status: DC
  Administered 2018-12-27 – 2018-12-28 (×2): 2 g via INTRAVENOUS
  Filled 2018-12-27 (×3): qty 2

## 2018-12-27 MED ORDER — PHENYLEPHRINE HCL-NACL 10-0.9 MG/250ML-% IV SOLN
25.0000 ug/min | INTRAVENOUS | Status: DC
  Administered 2018-12-27: 80 ug/min via INTRAVENOUS
  Administered 2018-12-27: 50 ug/min via INTRAVENOUS
  Administered 2018-12-27: 25 ug/min via INTRAVENOUS
  Administered 2018-12-28: 50 ug/min via INTRAVENOUS
  Administered 2018-12-28: 80 ug/min via INTRAVENOUS
  Administered 2018-12-28: 100 ug/min via INTRAVENOUS
  Administered 2018-12-28: 90 ug/min via INTRAVENOUS
  Administered 2018-12-28 (×5): 100 ug/min via INTRAVENOUS
  Filled 2018-12-27 (×5): qty 250
  Filled 2018-12-27: qty 500
  Filled 2018-12-27 (×5): qty 250

## 2018-12-27 MED ORDER — LACTATED RINGERS IV BOLUS: 1000.0000 mL | Freq: Once | INTRAVENOUS | Status: DC

## 2018-12-27 MED ORDER — SODIUM CHLORIDE 0.9% FLUSH
3.0000 mL | INTRAVENOUS | Status: AC | PRN
  Administered 2019-01-03: 3 mL

## 2018-12-27 MED ORDER — CISATRACURIUM BESYLATE 20 MG/10ML IV SOLN
INTRAVENOUS | Status: AC
  Filled 2018-12-27: qty 10

## 2018-12-27 MED ORDER — FENTANYL CITRATE (PF) 100 MCG/2ML IJ SOLN
25.0000 ug | INTRAMUSCULAR | Status: DC | PRN
  Administered 2018-12-27 (×2): 50 ug via INTRAVENOUS

## 2018-12-27 MED ORDER — SODIUM CHLORIDE 0.9% IV SOLUTION: 250.0000 mL | Freq: Once | INTRAVENOUS | Status: DC

## 2018-12-27 MED ORDER — SUCCINYLCHOLINE CHLORIDE 200 MG/10ML IV SOSY
PREFILLED_SYRINGE | INTRAVENOUS | Status: AC
  Filled 2018-12-27: qty 10

## 2018-12-27 MED ORDER — GLYCOPYRROLATE 0.2 MG/ML IJ SOLN
INTRAMUSCULAR | Status: DC | PRN
  Administered 2018-12-27: 0.4 mg via INTRAVENOUS

## 2018-12-27 MED ORDER — DEXTROSE-NACL 5-0.9 % IV SOLN
INTRAVENOUS | Status: DC
  Administered 2018-12-27 – 2018-12-28 (×4): via INTRAVENOUS

## 2018-12-27 MED ORDER — NEOSTIGMINE METHYLSULFATE 10 MG/10ML IV SOLN
INTRAVENOUS | Status: DC | PRN
  Administered 2018-12-27: 3 mg via INTRAVENOUS

## 2018-12-27 MED ORDER — PROPOFOL 10 MG/ML IV BOLUS
INTRAVENOUS | Status: AC
  Filled 2018-12-27: qty 20

## 2018-12-27 MED ORDER — SODIUM CHLORIDE 0.9 % IV BOLUS
1000.0000 mL | Freq: Once | INTRAVENOUS | Status: AC
  Administered 2018-12-27: 1000 mL via INTRAVENOUS

## 2018-12-27 MED ORDER — GUAIFENESIN-DM 100-10 MG/5ML PO SYRP: 10.0000 mL | ORAL_SOLUTION | ORAL | Status: DC | PRN

## 2018-12-27 MED ORDER — ACETAMINOPHEN 325 MG PO TABS: 650.0000 mg | ORAL_TABLET | Freq: Once | ORAL | Status: DC

## 2018-12-27 MED ORDER — HYDROMORPHONE HCL 1 MG/ML IJ SOLN
0.2500 mg | INTRAMUSCULAR | Status: DC | PRN
  Administered 2018-12-27 (×2): 0.5 mg via INTRAVENOUS

## 2018-12-27 MED ORDER — MAGIC MOUTHWASH: 15.0000 mL | Freq: Four times a day (QID) | ORAL | Status: DC | PRN

## 2018-12-27 MED ORDER — PROCHLORPERAZINE EDISYLATE 10 MG/2ML IJ SOLN: 5.0000 mg | INTRAMUSCULAR | Status: DC | PRN

## 2018-12-27 MED ORDER — SIMETHICONE 40 MG/0.6ML PO SUSP
40.0000 mg | Freq: Four times a day (QID) | ORAL | Status: DC | PRN
  Filled 2018-12-27: qty 0.6

## 2018-12-27 MED ORDER — SODIUM CHLORIDE 0.9 % IV SOLN
INTRAVENOUS | Status: AC
  Filled 2018-12-27: qty 2

## 2018-12-27 MED ORDER — ONDANSETRON HCL 4 MG/2ML IJ SOLN
INTRAMUSCULAR | Status: AC
  Filled 2018-12-27: qty 2

## 2018-12-27 MED ORDER — SUCCINYLCHOLINE CHLORIDE 20 MG/ML IJ SOLN
INTRAMUSCULAR | Status: DC | PRN
  Administered 2018-12-27: 100 mg via INTRAVENOUS

## 2018-12-27 MED ORDER — FENTANYL CITRATE (PF) 100 MCG/2ML IJ SOLN
INTRAMUSCULAR | Status: DC | PRN
  Administered 2018-12-27 (×5): 50 ug via INTRAVENOUS

## 2018-12-27 MED ORDER — FENTANYL CITRATE (PF) 100 MCG/2ML IJ SOLN
INTRAMUSCULAR | Status: AC
  Administered 2018-12-27: 50 ug via INTRAVENOUS
  Filled 2018-12-27: qty 2

## 2018-12-27 MED ORDER — ONDANSETRON HCL 4 MG/2ML IJ SOLN: 4.0000 mg | Freq: Four times a day (QID) | INTRAMUSCULAR | Status: DC | PRN

## 2018-12-27 MED ORDER — PHENOL 1.4 % MT LIQD
1.0000 | OROMUCOSAL | Status: DC | PRN
  Filled 2018-12-27: qty 177

## 2018-12-27 MED ORDER — SODIUM CHLORIDE 0.9% FLUSH: 10.0000 mL | INTRAVENOUS | Status: DC | PRN

## 2018-12-27 MED ORDER — DIPHENHYDRAMINE HCL 25 MG PO CAPS: 25.0000 mg | ORAL_CAPSULE | Freq: Once | ORAL | Status: DC

## 2018-12-27 MED ORDER — DIPHENHYDRAMINE HCL 50 MG/ML IJ SOLN
25.0000 mg | Freq: Once | INTRAMUSCULAR | Status: AC
  Administered 2018-12-27: 25 mg via INTRAVENOUS
  Filled 2018-12-27: qty 1

## 2018-12-27 MED ORDER — 0.9 % SODIUM CHLORIDE (POUR BTL) OPTIME
TOPICAL | Status: DC | PRN
  Administered 2018-12-27: 2000 mL

## 2018-12-27 MED ORDER — FENTANYL CITRATE (PF) 250 MCG/5ML IJ SOLN
INTRAMUSCULAR | Status: AC
  Filled 2018-12-27: qty 5

## 2018-12-27 MED ORDER — METHOCARBAMOL 1000 MG/10ML IJ SOLN
1000.0000 mg | Freq: Four times a day (QID) | INTRAVENOUS | Status: DC | PRN
  Administered 2018-12-28 (×2): 1000 mg via INTRAVENOUS
  Filled 2018-12-27 (×3): qty 10

## 2018-12-27 MED ORDER — SODIUM CHLORIDE 0.9 % IV SOLN
INTRAVENOUS | Status: DC | PRN
  Administered 2018-12-27: 2 g via INTRAVENOUS

## 2018-12-27 MED ORDER — CISATRACURIUM BESYLATE (PF) 10 MG/5ML IV SOLN
INTRAVENOUS | Status: DC | PRN
  Administered 2018-12-27: 4 mg via INTRAVENOUS
  Administered 2018-12-27: 2 mg via INTRAVENOUS
  Administered 2018-12-27: 8 mg via INTRAVENOUS

## 2018-12-27 MED ORDER — SODIUM CHLORIDE 0.9% IV SOLUTION
Freq: Once | INTRAVENOUS | Status: AC
  Administered 2018-12-28: 12:00:00 via INTRAVENOUS

## 2018-12-27 MED ORDER — ACETAMINOPHEN 650 MG RE SUPP: 650.0000 mg | Freq: Four times a day (QID) | RECTAL | Status: DC | PRN

## 2018-12-27 MED ORDER — LIP MEDEX EX OINT
1.0000 "application " | TOPICAL_OINTMENT | Freq: Two times a day (BID) | CUTANEOUS | Status: DC
  Administered 2018-12-27 – 2019-01-10 (×28): 1 via TOPICAL
  Filled 2018-12-27 (×5): qty 7

## 2018-12-27 MED ORDER — HYDROMORPHONE HCL 1 MG/ML IJ SOLN
INTRAMUSCULAR | Status: AC
  Administered 2018-12-27: 0.5 mg via INTRAVENOUS
  Filled 2018-12-27: qty 1

## 2018-12-27 MED ORDER — SODIUM CHLORIDE 0.9 % IV BOLUS: 1000.0000 mL | Freq: Once | INTRAVENOUS | Status: DC

## 2018-12-27 MED ORDER — LACTATED RINGERS IV SOLN
INTRAVENOUS | Status: DC | PRN
  Administered 2018-12-27: 11:00:00 via INTRAVENOUS

## 2018-12-27 MED ORDER — INSULIN ASPART 100 UNIT/ML ~~LOC~~ SOLN: 0.0000 [IU] | SUBCUTANEOUS | Status: DC

## 2018-12-27 MED ORDER — DIPHENHYDRAMINE HCL 50 MG/ML IJ SOLN: 12.5000 mg | Freq: Four times a day (QID) | INTRAMUSCULAR | Status: DC | PRN

## 2018-12-27 MED ORDER — HEPARIN SOD (PORK) LOCK FLUSH 100 UNIT/ML IV SOLN
250.0000 [IU] | INTRAVENOUS | Status: DC | PRN
  Filled 2018-12-27: qty 2.5

## 2018-12-27 MED ORDER — ONDANSETRON HCL 4 MG/2ML IJ SOLN
INTRAMUSCULAR | Status: DC | PRN
  Administered 2018-12-27: 4 mg via INTRAVENOUS

## 2018-12-27 MED ORDER — PHENYLEPHRINE HCL 10 MG/ML IJ SOLN
INTRAMUSCULAR | Status: DC | PRN
  Administered 2018-12-27: 120 ug via INTRAVENOUS
  Administered 2018-12-27 (×3): 80 ug via INTRAVENOUS

## 2018-12-27 MED ORDER — CHLORHEXIDINE GLUCONATE CLOTH 2 % EX PADS
6.0000 | MEDICATED_PAD | Freq: Every day | CUTANEOUS | Status: DC
  Administered 2018-12-28 – 2019-01-04 (×6): 6 via TOPICAL

## 2018-12-27 MED ORDER — ETOMIDATE 2 MG/ML IV SOLN
INTRAVENOUS | Status: AC
  Filled 2018-12-27: qty 10

## 2018-12-27 MED ORDER — SODIUM CHLORIDE 0.9 % IV SOLN
INTRAVENOUS | Status: DC | PRN
  Administered 2018-12-27: 25 ug/min via INTRAVENOUS

## 2018-12-27 MED ORDER — FENTANYL CITRATE (PF) 100 MCG/2ML IJ SOLN
25.0000 ug | INTRAMUSCULAR | Status: DC | PRN
  Administered 2018-12-27 – 2018-12-29 (×15): 50 ug via INTRAVENOUS
  Filled 2018-12-27 (×15): qty 2

## 2018-12-27 MED ORDER — SODIUM CHLORIDE 0.9% FLUSH
10.0000 mL | INTRAVENOUS | Status: AC | PRN
  Administered 2019-01-03: 10 mL

## 2018-12-27 MED ORDER — LACTATED RINGERS IV BOLUS
1000.0000 mL | Freq: Three times a day (TID) | INTRAVENOUS | Status: DC | PRN
  Administered 2018-12-27: 1000 mL via INTRAVENOUS

## 2018-12-27 MED ORDER — CHLORHEXIDINE GLUCONATE 0.12 % MT SOLN
15.0000 mL | Freq: Two times a day (BID) | OROMUCOSAL | Status: DC
  Administered 2018-12-27 – 2019-01-10 (×23): 15 mL via OROMUCOSAL
  Filled 2018-12-27 (×21): qty 15

## 2018-12-27 MED ORDER — ACETAMINOPHEN 10 MG/ML IV SOLN
1000.0000 mg | Freq: Four times a day (QID) | INTRAVENOUS | Status: AC
  Administered 2018-12-27 – 2018-12-28 (×4): 1000 mg via INTRAVENOUS
  Filled 2018-12-27 (×4): qty 100

## 2018-12-27 MED ORDER — BISACODYL 10 MG RE SUPP: 10.0000 mg | Freq: Two times a day (BID) | RECTAL | Status: DC | PRN

## 2018-12-27 MED ORDER — SODIUM CHLORIDE 0.9 % IV SOLN
8.0000 mg | Freq: Four times a day (QID) | INTRAVENOUS | Status: DC | PRN
  Filled 2018-12-27: qty 4

## 2018-12-27 MED ORDER — ALUM & MAG HYDROXIDE-SIMETH 200-200-20 MG/5ML PO SUSP: 30.0000 mL | Freq: Four times a day (QID) | ORAL | Status: DC | PRN

## 2018-12-27 MED ORDER — CALCIUM GLUCONATE-NACL 1-0.675 GM/50ML-% IV SOLN
1.0000 g | Freq: Once | INTRAVENOUS | Status: AC
  Administered 2018-12-27: 1 g via INTRAVENOUS
  Filled 2018-12-27 (×2): qty 50

## 2018-12-27 MED ORDER — DILTIAZEM HCL 100 MG IV SOLR
5.0000 mg/h | INTRAVENOUS | Status: DC
  Administered 2018-12-27: 5 mg/h via INTRAVENOUS
  Administered 2018-12-27: 15 mg/h via INTRAVENOUS
  Filled 2018-12-27 (×4): qty 100

## 2018-12-27 MED ORDER — SODIUM CHLORIDE 0.9% FLUSH
10.0000 mL | Freq: Two times a day (BID) | INTRAVENOUS | Status: DC
  Administered 2018-12-27 – 2019-01-10 (×19): 10 mL

## 2018-12-27 MED ORDER — INSULIN ASPART 100 UNIT/ML ~~LOC~~ SOLN
0.0000 [IU] | SUBCUTANEOUS | Status: DC
  Administered 2018-12-27 – 2018-12-28 (×4): 2 [IU] via SUBCUTANEOUS
  Administered 2018-12-28: 3 [IU] via SUBCUTANEOUS
  Administered 2018-12-28 – 2018-12-30 (×3): 2 [IU] via SUBCUTANEOUS
  Administered 2018-12-31: 5 [IU] via SUBCUTANEOUS
  Administered 2018-12-31: 3 [IU] via SUBCUTANEOUS
  Administered 2018-12-31: 8 [IU] via SUBCUTANEOUS

## 2018-12-27 MED ORDER — HEPARIN SOD (PORK) LOCK FLUSH 100 UNIT/ML IV SOLN
500.0000 [IU] | Freq: Every day | INTRAVENOUS | Status: DC | PRN
  Filled 2018-12-27: qty 5

## 2018-12-27 MED ORDER — FUROSEMIDE 10 MG/ML IJ SOLN
20.0000 mg | Freq: Once | INTRAMUSCULAR | Status: AC
  Administered 2018-12-27: 20 mg via INTRAVENOUS
  Filled 2018-12-27: qty 2

## 2018-12-27 MED ORDER — PHENYLEPHRINE HCL 10 MG/ML IJ SOLN
INTRAMUSCULAR | Status: AC
  Filled 2018-12-27: qty 1

## 2018-12-27 MED ORDER — SODIUM CHLORIDE 0.9 % IV SOLN
250.0000 mL | INTRAVENOUS | Status: DC
  Administered 2018-12-27 – 2018-12-30 (×2): 250 mL via INTRAVENOUS

## 2018-12-27 MED ORDER — SODIUM CHLORIDE 0.9% IV SOLUTION: Freq: Once | INTRAVENOUS | Status: DC

## 2018-12-27 MED ORDER — LIDOCAINE 2% (20 MG/ML) 5 ML SYRINGE
INTRAMUSCULAR | Status: AC
  Filled 2018-12-27: qty 5

## 2018-12-27 SURGICAL SUPPLY — 53 items
ADH SKN CLS APL DERMABOND .7 (GAUZE/BANDAGES/DRESSINGS) ×2
BLADE EXTENDED COATED 6.5IN (ELECTRODE) IMPLANT
BLADE HEX COATED 2.75 (ELECTRODE) ×3 IMPLANT
COVER MAYO STAND STRL (DRAPES) ×3 IMPLANT
COVER SURGICAL LIGHT HANDLE (MISCELLANEOUS) ×3 IMPLANT
COVER WAND RF STERILE (DRAPES) ×2 IMPLANT
DERMABOND ADVANCED (GAUZE/BANDAGES/DRESSINGS) ×4
DERMABOND ADVANCED .7 DNX12 (GAUZE/BANDAGES/DRESSINGS) IMPLANT
DRAIN CHANNEL 19F RND (DRAIN) IMPLANT
DRAPE LAPAROSCOPIC ABDOMINAL (DRAPES) ×3 IMPLANT
DRAPE WARM FLUID 44X44 (DRAPE) ×3 IMPLANT
ELECT PENCIL ROCKER SW 15FT (MISCELLANEOUS) ×2 IMPLANT
ELECT REM PT RETURN 15FT ADLT (MISCELLANEOUS) ×3 IMPLANT
EVACUATOR SILICONE 100CC (DRAIN) IMPLANT
GAUZE SPONGE 4X4 12PLY STRL (GAUZE/BANDAGES/DRESSINGS) ×3 IMPLANT
GLOVE BIO SURGEON STRL SZ 6.5 (GLOVE) ×1 IMPLANT
GLOVE BIO SURGEONS STRL SZ 6.5 (GLOVE) ×1
GLOVE BIOGEL M 8.0 STRL (GLOVE) ×3 IMPLANT
GLOVE BIOGEL PI IND STRL 6.5 (GLOVE) IMPLANT
GLOVE BIOGEL PI IND STRL 7.0 (GLOVE) IMPLANT
GLOVE BIOGEL PI INDICATOR 6.5 (GLOVE) ×4
GLOVE BIOGEL PI INDICATOR 7.0 (GLOVE) ×10
GLOVE INDICATOR 6.5 STRL GRN (GLOVE) ×2 IMPLANT
GLOVE SURG SS PI 7.0 STRL IVOR (GLOVE) ×2 IMPLANT
GOWN STRL REUS W/ TWL LRG LVL3 (GOWN DISPOSABLE) IMPLANT
GOWN STRL REUS W/TWL LRG LVL3 (GOWN DISPOSABLE) ×15
GOWN STRL REUS W/TWL XL LVL3 (GOWN DISPOSABLE) ×7 IMPLANT
HANDLE STAPLE EGIA 4 XL (STAPLE) ×2 IMPLANT
HANDLE SUCTION POOLE (INSTRUMENTS) IMPLANT
LIGASURE IMPACT 36 18CM CVD LR (INSTRUMENTS) ×2 IMPLANT
PACK COLON (CUSTOM PROCEDURE TRAY) ×2 IMPLANT
RELOAD EGIA 60 MED/THCK PURPLE (STAPLE) ×6 IMPLANT
RELOAD STAPLE 60 MED/THCK ART (STAPLE) IMPLANT
RETAINER VISCERA MED (MISCELLANEOUS) ×2 IMPLANT
SPONGE LAP 18X18 RF (DISPOSABLE) ×6 IMPLANT
STAPLER VISISTAT 35W (STAPLE) ×5 IMPLANT
SUCTION POOLE HANDLE (INSTRUMENTS) ×6
SUT CHROMIC 3 0 SH 27 (SUTURE) ×4 IMPLANT
SUT NOVA NAB DX-16 0-1 5-0 T12 (SUTURE) ×10 IMPLANT
SUT PDS AB 1 CTX 36 (SUTURE) IMPLANT
SUT SILK 2 0 (SUTURE) ×3
SUT SILK 2 0 SH CR/8 (SUTURE) ×1 IMPLANT
SUT SILK 2-0 18XBRD TIE 12 (SUTURE) ×1 IMPLANT
SUT SILK 3 0 (SUTURE) ×3
SUT SILK 3 0 SH CR/8 (SUTURE) ×3 IMPLANT
SUT SILK 3-0 18XBRD TIE 12 (SUTURE) ×1 IMPLANT
SUT VIC AB 3-0 SH 18 (SUTURE) IMPLANT
SUT VICRYL 2 0 18  UND BR (SUTURE)
SUT VICRYL 2 0 18 UND BR (SUTURE) IMPLANT
TOWEL OR 17X26 10 PK STRL BLUE (TOWEL DISPOSABLE) ×3 IMPLANT
TOWEL OR NON WOVEN STRL DISP B (DISPOSABLE) ×3 IMPLANT
TUBING CONNECTING 10 (TUBING) ×2 IMPLANT
TUBING CONNECTING 10' (TUBING) ×2

## 2018-12-27 NOTE — Progress Notes (Signed)
Pt bp 80/48, ng tube output 1050 cc since 1900, provider notified. np ordered 1L NS bolus, rn admin as ordered

## 2018-12-27 NOTE — Progress Notes (Signed)
CRITICAL VALUE ALERT  Critical Value: Ca 6.2  Date & Time Notied:  12/27/2018 0345  Provider Notified: E-Link MD  Orders Received/Actions taken: Ionized Calcium

## 2018-12-27 NOTE — Transfer of Care (Signed)
Immediate Anesthesia Transfer of Care Note  Patient: Cassandra Allen  Procedure(s) Performed: LEFT SIGMOID COLECTOMY WITH HARTMANN POUCH AND END COLOSTOMY (N/A Abdomen)  Patient Location: PACU  Anesthesia Type:General  Level of Consciousness: awake, alert  and oriented  Airway & Oxygen Therapy: Patient Spontanous Breathing and Patient connected to face mask oxygen  Post-op Assessment: Report given to RN and Post -op Vital signs reviewed and stable  Post vital signs: Reviewed and stable  Last Vitals:  Vitals Value Taken Time  BP    Temp    Pulse    Resp    SpO2      Last Pain:  Vitals:   12/27/18 1036  TempSrc:   PainSc: 0-No pain      Patients Stated Pain Goal: 2 (37/36/68 1594)  Complications: No apparent anesthesia complications

## 2018-12-27 NOTE — H&P (View-Only) (Signed)
[facility] Progress Note     Subjective: CC: abdominal pain Patient denies pain at this moment but reports pain was slightly worsened overnight. Has had high output from NGT and worsening kidney function. Went into AFib although this has improved some with cardizem gtt. Discussed options moving forward with patient of surgery vs. Palliative care.   Seen with Dr. Hassell Done today. Son at bedside with patient.   Objective: Vital signs in last 24 hours: Temp:  [97.6 F (36.4 C)-98.1 F (36.7 C)] 97.6 F (36.4 C) (02/14 0316) Pulse Rate:  [66-138] 122 (02/14 0611) Resp:  [11-16] 13 (02/14 0611) BP: (80-147)/(38-120) 116/53 (02/14 0611) SpO2:  [88 %-98 %] 98 % (02/14 0611) Weight:  [79.2 kg] 79.2 kg (02/14 0500) Last BM Date: 12/24/18  Intake/Output from previous day: 02/13 0701 - 02/14 0700 In: 1753.6 [I.V.:498.8; IV Piggyback:1254.8] Out: 4900 [Urine:400; Emesis/NG output:4500] Intake/Output this shift: No intake/output data recorded.  PE: Gen:  Alert, NAD, pleasant Card:  Irregularly irregular Pulm:  Normal effort, clear to auscultation bilaterally Abd: Soft, somewhat generalized TTP, distended, bowel sounds hypoactive Skin: warm and dry, no rashes  Psych: A&Ox3   Lab Results:  Recent Labs    12/26/18 0702 12/27/18 0325  WBC 13.2* 6.9  HGB 10.1* 9.4*  HCT 32.6* 31.2*  PLT 308 284   BMET Recent Labs    12/26/18 0702 12/27/18 0325  NA 137 139  K 4.1 3.9  CL 103 100  CO2 22 21*  GLUCOSE 181* 104*  BUN 119* 129*  CREATININE 2.26* 3.08*  CALCIUM 6.6* 6.2*   PT/INR Recent Labs    12/27/18 0325  LABPROT 23.7*  INR 2.14   CMP     Component Value Date/Time   NA 139 12/27/2018 0325   K 3.9 12/27/2018 0325   CL 100 12/27/2018 0325   CO2 21 (L) 12/27/2018 0325   GLUCOSE 104 (H) 12/27/2018 0325   BUN 129 (H) 12/27/2018 0325   CREATININE 3.08 (H) 12/27/2018 0325   CREATININE 1.04 (H) 12/18/2018 1029   CALCIUM 6.2 (LL) 12/27/2018 0325   PROT  5.3 (L) 12/25/2018 2254   ALBUMIN 2.7 (L) 12/25/2018 2254   AST 24 12/25/2018 2254   AST 60 (H) 12/18/2018 1029   ALT 21 12/25/2018 2254   ALT 117 (H) 12/18/2018 1029   ALKPHOS 73 12/25/2018 2254   BILITOT 0.6 12/25/2018 2254   BILITOT 0.5 12/18/2018 1029   GFRNONAA 14 (L) 12/27/2018 0325   GFRNONAA 54 (L) 12/18/2018 1029   GFRAA 17 (L) 12/27/2018 0325   GFRAA >60 12/18/2018 1029   Lipase     Component Value Date/Time   LIPASE 24 12/25/2018 2254       Studies/Results: Ct Abdomen Pelvis Wo Contrast  Result Date: 12/26/2018 CLINICAL DATA:  Abdominal pain, known history of colon carcinoma with liver metastatic disease EXAM: CT ABDOMEN AND PELVIS WITHOUT CONTRAST TECHNIQUE: Multidetector CT imaging of the abdomen and pelvis was performed following the standard protocol without IV contrast. COMPARISON:  MRI from 12/07/2018 and CT from 11/01/2018 FINDINGS: Lower chest: No acute abnormality. Hepatobiliary: Somewhat limited due to lack of IV contrast. Hypodense lesion is again seen in the caudate lobe consistent with the known metastatic disease. Gallbladder is well distended with multiple gallstones within. No biliary ductal dilatation is seen. Density is noted within the distal common bile duct consistent with common bile duct stone similar to that seen on prior MRI. Pancreas: Unremarkable. No pancreatic ductal dilatation or surrounding inflammatory changes. Spleen:  Normal in size without focal abnormality. Adrenals/Urinary Tract: Right adrenal gland is within normal limits. Stable left adrenal lesion is seen shown to represent adenoma on prior MRI. The kidneys are within normal limits bilaterally. No renal calculi or obstructive changes are seen. The bladder is partially decompressed. Stomach/Bowel: Scattered diverticular change of the colon is noted. The transverse and ascending colon are dilated and fluid-filled. There is a caliber change identified at the mid descending colon. This would  correspond with the known colonic mass seen on prior colonoscopy. Additionally there is a mass lesion within lumen of the sigmoid colon similar to that seen on prior CT examination and colonoscopy. Small-bowel is significantly distended with fluid and air consistent with the proximal descending colon obstructing lesion. Dilatation extends from the fourth portion of the duodenum to the terminal ileum. Vascular/Lymphatic: Aortic atherosclerosis. No enlarged abdominal or pelvic lymph nodes. Reproductive: Uterus and bilateral adnexa are unremarkable. Other: No abdominal wall hernia or abnormality. No abdominopelvic ascites. Musculoskeletal: Degenerative changes of lumbar spine. IMPRESSION: Changes consistent with an obstructing lesion in the proximal descending colon consistent with the known history of colon carcinoma. The proximal colon as well as the entire small bowel is dilated secondary to the obstructive change. Second intraluminal mass within the sigmoid colon consistent with prior colonoscopy findings. Stable left adrenal lesion shown to represent adenoma on recent MRI. Hepatic metastatic disease similar to the prior MRI. Cholelithiasis without complicating factors. Electronically Signed   By: Inez Catalina M.D.   On: 12/26/2018 01:43   Dg Abdomen 1 View  Result Date: 12/26/2018 CLINICAL DATA:  Nasogastric tube placement EXAM: ABDOMEN - 1 VIEW COMPARISON:  Abdominal CT earlier today FINDINGS: Nasogastric tube tip at the distal stomach. Diffuse gaseous bowel distention in this patient with left colonic obstruction by CT. IMPRESSION: 1. Nasogastric tube with tip at the distal stomach. 2. Known descending colonic obstruction. Electronically Signed   By: Monte Fantasia M.D.   On: 12/26/2018 04:18    Anti-infectives: Anti-infectives (From admission, onward)   Start     Dose/Rate Route Frequency Ordered Stop   12/26/18 1230  cefoTEtan (CEFOTAN) 2 g in sodium chloride 0.9 % 100 mL IVPB     2 g 200 mL/hr  over 30 Minutes Intravenous On call to O.R. 12/26/18 1224 12/27/18 0559       Assessment/Plan Recent admission for chest pain Atrial fibrillation on Eliquis -last dose 12/25/2018 Hypertension Acute on chronic kidney disease stage III Type 2 diabetes Hx depression Anemia  Colon obstruction - moderately differentiated adenocarcinomaof left colon with obstructing mass near the splenic flexure as well as a second colon cancer in the sigmoid colon - currently on chemotherapy - last dose 12/18/18? Liver metastasis   FEN: N.p.o./IV fluids ID:  cefotetan pre-op DVT: Eliquis - last dose 12/25/18  Plan: Patient acutely worsened and went into AFib with worsening renal function overnight. High NGT output overnight. Patient transferred to ICU. Discussed options of surgery vs palliative care with patient this AM and patient has elected to proceed with surgery. Patient will need continued resuscitation post-operatively and we appreciate CCM involvement.   LOS: 1 day    Brigid Re , [facility] 12/27/2018, 8:09 AM Pager: [phone] Consults: [phone]

## 2018-12-27 NOTE — Op Note (Signed)
Cassandra Allen  November 02, 1946 27 December 2018    PCP:  [name], MD   Surgeon: Kaylyn Lim, MD, FACS  Asst:  Brigid Re, PA  Anes:  general  Preop Dx: Obstructing cancer of the left and sigmoid colon with near total obstruction in the descending colon Postop Dx: same  Procedure: Laparotomy with mobilization of the splenic flexure and resection of the  sigmoid, descending and distal transverse colon with [place] pouch and end colostomy in the right upper quadrant Location Surgery: [facility] 4 Complications: None noted  EBL:   60 cc  Drains: none  Description of Procedure:  The patient was taken to OR 4 on Friday, Valentine's Day .  After anesthesia was administered and the patient was prepped  with [employer] and a timeout was performed.  The patient had been marked on the right and left sides for potential ostomies.  A midline incision was utilized to enter the abdomen without incident going from above and below the umbilicus.  The small bowel was markedly dilated as was the entire colon over to the descending colon.  Patient had evidence of tattoo ink in the mesentery and in the omentum.  We palpated a solid nodule up in the left lateral segment because of her size and the degree of distention that was present we are unable to really get adequate visualization of the caudate lobe or biopsy any liver lesions.  We began in the sigmoid colon and found a marked lesion and could feel a mass and went below that and dissected through the mesentery and applied Covidien purple load Tri-Star stapler.  The LigaSure was then used to transect the mesentery staying fairly close to the bowel.  The dye that is been used to tattoo the sigmoid lesion did sort of distort the anatomy near the ovary.  We did not get as deep as the ureter.  We incised the peritoneum along the line of [place] and carried this up and along the descending colon and found the apple core type lesion that was totally  obstructing proximally causing ischemic changes in the splenic flexure and transverse colon.  We then continued our mobilization toward the splenic flexure.  At that point we went over to the mid transverse colon and took the omentum off the transverse colon at that point and then began dissecting up toward the splenic flexure and manually completely dissected this free without entering it.  Bleeding was controlled.  The apple core lesion was totally obstructing the colon pretty much.  Once it was mobilized to be brought into the midline incision.  The splenic flexure was markedly dilated and appeared ischemic.  There is also a fetid odor noted.  We went back into the mid transverse colon and divided the bowel again with a purple load.  The completion of the risks of the resection was performed with the LigaSure and the specimen was passed off the table.  We then did further mobilization of the proximal transverse colon and there were able to get that out through the previously marked ostomy site in the right side.  This area skin was grasped with a Coker clamp elevated and and cut with a blade revealing circular ostomy site.  The fascia was scored dilated manually.  The grasped the end of the colon and brought it out.  We surveyed the rest of the abdomen and irrigated.  We placed sterile towels over the wound and then isolating the right side we used a pool sucker  tip to inserted through a portion of [place] cut and the colon in the ostomy and decompressed the right colon and some of the gas to facilitate with closure.  This was then reclamped and I changed my gown and gloves before reentering the operative field.  We kept this area walled off while we then proceeded to close.  The peritoneum was closed in the lower third of the incision with a 2-0 Vicryl.  The fascia was closed with interrupted #1 Novafil's with simple sutures then with figure-of-eight's.  Following irrigation again the skin was closed with a  stapler and the Dermabond was placed on this wound to protect it from the subsequent maturation of the ostomy.  The right upper quadrant ostomy was then matured with running locking sutures of 3-0 chromic from either end and tying together.  The patient remained hemodynamically stable prior was taken to the recovery room prior to transfer back to the intensive care unit  The patient tolerated the procedure well and was taken to the PACU in stable condition.     Matt B. Hassell Done, [place], [facility], [place]

## 2018-12-27 NOTE — Anesthesia Procedure Notes (Signed)
Procedure Name: Intubation Date/Time: 12/27/2018 11:00 AM Performed by: Glory Buff, CRNA Pre-anesthesia Checklist: Patient identified, Emergency Drugs available, Suction available and Patient being monitored Patient Re-evaluated:Patient Re-evaluated prior to induction Oxygen Delivery Method: Circle system utilized Preoxygenation: Pre-oxygenation with 100% oxygen Induction Type: IV induction, Cricoid Pressure applied and Rapid sequence Ventilation: Mask ventilation without difficulty Laryngoscope Size: Miller and 3 Grade View: Grade I Tube type: Subglottic suction tube Tube size: 7.5 mm Number of attempts: 1 Airway Equipment and Method: Stylet and Oral airway Placement Confirmation: ETT inserted through vocal cords under direct vision,  positive ETCO2 and breath sounds checked- equal and bilateral Secured at: 20 cm Tube secured with: Tape Dental Injury: Teeth and Oropharynx as per pre-operative assessment

## 2018-12-27 NOTE — Progress Notes (Signed)
[facility] Progress Note     Subjective: CC: abdominal pain Patient denies pain at this moment but reports pain was slightly worsened overnight. Has had high output from NGT and worsening kidney function. Went into AFib although this has improved some with cardizem gtt. Discussed options moving forward with patient of surgery vs. Palliative care.   Seen with Dr. Hassell Done today. Son at bedside with patient.   Objective: Vital signs in last 24 hours: Temp:  [97.6 F (36.4 C)-98.1 F (36.7 C)] 97.6 F (36.4 C) (02/14 0316) Pulse Rate:  [66-138] 122 (02/14 0611) Resp:  [11-16] 13 (02/14 0611) BP: (80-147)/(38-120) 116/53 (02/14 0611) SpO2:  [88 %-98 %] 98 % (02/14 0611) Weight:  [79.2 kg] 79.2 kg (02/14 0500) Last BM Date: 12/24/18  Intake/Output from previous day: 02/13 0701 - 02/14 0700 In: 1753.6 [I.V.:498.8; IV Piggyback:1254.8] Out: 4900 [Urine:400; Emesis/NG output:4500] Intake/Output this shift: No intake/output data recorded.  PE: Gen:  Alert, NAD, pleasant Card:  Irregularly irregular Pulm:  Normal effort, clear to auscultation bilaterally Abd: Soft, somewhat generalized TTP, distended, bowel sounds hypoactive Skin: warm and dry, no rashes  Psych: A&Ox3   Lab Results:  Recent Labs    12/26/18 0702 12/27/18 0325  WBC 13.2* 6.9  HGB 10.1* 9.4*  HCT 32.6* 31.2*  PLT 308 284   BMET Recent Labs    12/26/18 0702 12/27/18 0325  NA 137 139  K 4.1 3.9  CL 103 100  CO2 22 21*  GLUCOSE 181* 104*  BUN 119* 129*  CREATININE 2.26* 3.08*  CALCIUM 6.6* 6.2*   PT/INR Recent Labs    12/27/18 0325  LABPROT 23.7*  INR 2.14   CMP     Component Value Date/Time   NA 139 12/27/2018 0325   K 3.9 12/27/2018 0325   CL 100 12/27/2018 0325   CO2 21 (L) 12/27/2018 0325   GLUCOSE 104 (H) 12/27/2018 0325   BUN 129 (H) 12/27/2018 0325   CREATININE 3.08 (H) 12/27/2018 0325   CREATININE 1.04 (H) 12/18/2018 1029   CALCIUM 6.2 (LL) 12/27/2018 0325   PROT  5.3 (L) 12/25/2018 2254   ALBUMIN 2.7 (L) 12/25/2018 2254   AST 24 12/25/2018 2254   AST 60 (H) 12/18/2018 1029   ALT 21 12/25/2018 2254   ALT 117 (H) 12/18/2018 1029   ALKPHOS 73 12/25/2018 2254   BILITOT 0.6 12/25/2018 2254   BILITOT 0.5 12/18/2018 1029   GFRNONAA 14 (L) 12/27/2018 0325   GFRNONAA 54 (L) 12/18/2018 1029   GFRAA 17 (L) 12/27/2018 0325   GFRAA >60 12/18/2018 1029   Lipase     Component Value Date/Time   LIPASE 24 12/25/2018 2254       Studies/Results: Ct Abdomen Pelvis Wo Contrast  Result Date: 12/26/2018 CLINICAL DATA:  Abdominal pain, known history of colon carcinoma with liver metastatic disease EXAM: CT ABDOMEN AND PELVIS WITHOUT CONTRAST TECHNIQUE: Multidetector CT imaging of the abdomen and pelvis was performed following the standard protocol without IV contrast. COMPARISON:  MRI from 12/07/2018 and CT from 11/01/2018 FINDINGS: Lower chest: No acute abnormality. Hepatobiliary: Somewhat limited due to lack of IV contrast. Hypodense lesion is again seen in the caudate lobe consistent with the known metastatic disease. Gallbladder is well distended with multiple gallstones within. No biliary ductal dilatation is seen. Density is noted within the distal common bile duct consistent with common bile duct stone similar to that seen on prior MRI. Pancreas: Unremarkable. No pancreatic ductal dilatation or surrounding inflammatory changes. Spleen:  Normal in size without focal abnormality. Adrenals/Urinary Tract: Right adrenal gland is within normal limits. Stable left adrenal lesion is seen shown to represent adenoma on prior MRI. The kidneys are within normal limits bilaterally. No renal calculi or obstructive changes are seen. The bladder is partially decompressed. Stomach/Bowel: Scattered diverticular change of the colon is noted. The transverse and ascending colon are dilated and fluid-filled. There is a caliber change identified at the mid descending colon. This would  correspond with the known colonic mass seen on prior colonoscopy. Additionally there is a mass lesion within lumen of the sigmoid colon similar to that seen on prior CT examination and colonoscopy. Small-bowel is significantly distended with fluid and air consistent with the proximal descending colon obstructing lesion. Dilatation extends from the fourth portion of the duodenum to the terminal ileum. Vascular/Lymphatic: Aortic atherosclerosis. No enlarged abdominal or pelvic lymph nodes. Reproductive: Uterus and bilateral adnexa are unremarkable. Other: No abdominal wall hernia or abnormality. No abdominopelvic ascites. Musculoskeletal: Degenerative changes of lumbar spine. IMPRESSION: Changes consistent with an obstructing lesion in the proximal descending colon consistent with the known history of colon carcinoma. The proximal colon as well as the entire small bowel is dilated secondary to the obstructive change. Second intraluminal mass within the sigmoid colon consistent with prior colonoscopy findings. Stable left adrenal lesion shown to represent adenoma on recent MRI. Hepatic metastatic disease similar to the prior MRI. Cholelithiasis without complicating factors. Electronically Signed   By: Inez Catalina M.D.   On: 12/26/2018 01:43   Dg Abdomen 1 View  Result Date: 12/26/2018 CLINICAL DATA:  Nasogastric tube placement EXAM: ABDOMEN - 1 VIEW COMPARISON:  Abdominal CT earlier today FINDINGS: Nasogastric tube tip at the distal stomach. Diffuse gaseous bowel distention in this patient with left colonic obstruction by CT. IMPRESSION: 1. Nasogastric tube with tip at the distal stomach. 2. Known descending colonic obstruction. Electronically Signed   By: Monte Fantasia M.D.   On: 12/26/2018 04:18    Anti-infectives: Anti-infectives (From admission, onward)   Start     Dose/Rate Route Frequency Ordered Stop   12/26/18 1230  cefoTEtan (CEFOTAN) 2 g in sodium chloride 0.9 % 100 mL IVPB     2 g 200 mL/hr  over 30 Minutes Intravenous On call to O.R. 12/26/18 1224 12/27/18 0559       Assessment/Plan Recent admission for chest pain Atrial fibrillation on Eliquis -last dose 12/25/2018 Hypertension Acute on chronic kidney disease stage III Type 2 diabetes Hx depression Anemia  Colon obstruction - moderately differentiated adenocarcinomaof left colon with obstructing mass near the splenic flexure as well as a second colon cancer in the sigmoid colon - currently on chemotherapy - last dose 12/18/18? Liver metastasis   FEN: N.p.o./IV fluids ID:  cefotetan pre-op DVT: Eliquis - last dose 12/25/18  Plan: Patient acutely worsened and went into AFib with worsening renal function overnight. High NGT output overnight. Patient transferred to ICU. Discussed options of surgery vs palliative care with patient this AM and patient has elected to proceed with surgery. Patient will need continued resuscitation post-operatively and we appreciate CCM involvement.   LOS: 1 day    Brigid Re , [facility] 12/27/2018, 8:09 AM Pager: [phone] Consults: [phone]

## 2018-12-27 NOTE — Progress Notes (Signed)
[facility] PROGRESS NOTE  Cassandra Allen TGG:[id number] DOB: Mar 22, 1946 DOA: 12/25/2018 PCP: [name], MD   Narrative:  73 year old Caucasian female Newly diagnosed colon cancer 10/25/2018 after colonoscopy with metastases started on FOLFOX every 2 weekly 12/04/2018 Iron deficiency anemia on iron sucrose Diabetes mellitus type 2 HTN CKD Atrial fibrillation diagnosed 04/2018 [name] score about 3 on anticoagulation  Presented to emergency room with mention to complaints and probable new obstructing mass in addition to AKI Baseline creatinine went from 1.3-2.39  General surgery consulted  Patient developed A. fib with RVR overnight  2/13 and was transferred to stepdown unit   A & Plan A. fib with RVR chads score >3 Controlled with Cardizem and titrate up to 15/h-if not controlled will consider addition of IV digoxin for temporary management of the same and ensure that magnesium and potassium both corrected we will recheck in a.m.-needs stepdown Resume anticoagulation as per general surgery when they feel safe to do so Obstructive colonic mass in the setting of newly diagnosed colon cancer 10/25/2018 Just back from surgery after my reevaluation at 1530 Pain management etc. as per general surgery He will be weaned off of fluids/pressors that were perioperative otherwise I will consult critical care Iron deficiency with acute blood loss anemia superimposed upon the same hemoglobin at baseline is in the 10 range-his hematocrit has dropped postsurgery to 7.6 Will transfuse 2 units of PRBC at this time Diabetes mellitus type 2 Check sugars every 4 hourly will change fluids to D5 normal saline 150 cc/h for 18 hours HTN Elevated postoperatively secondary to pain Blood pressure to be controlled with Cardizem drip temporarily until can resume other meds Acute superimposed on CKD stage II Baseline creatinine 1.3 on admission about 2.2-we will give IV saline we will monitor  and repeat labs a.m. expect will trend downwards postoperatively Morbid obesity BMI 30    DVT heparin at this time code Status: Full communication: None disposition Plan: Inpatient   Cassandra Molnar, MD  [facility] Via amion app OR [email or web address] 7PM-7AM contact night coverage as above 12/27/2018, 8:09 AM  LOS: 1 day   Consultants:  General surgery  Cardiology  Procedures:  Status post resection laparotomy Hartmann pouch placement 12/27/2018  Antimicrobials:  Peri-operative at this time  Interval history/Subjective: Just back from surgery seen earlier this morning prior to surgery and doing fair Seem to need Neo-Synephrine in PACU and sats are slightly low in the 90s NG tube in place Heart rates about 1 10-1 20  Objective:  Vitals:  Vitals:   12/27/18 0600 12/27/18 0611  BP: (!) 94/38 (!) 116/53  Pulse: (!) 107 (!) 122  Resp: 12 13  Temp:    SpO2: 97% 98%    Exam:  Awake coherent but seems to be in pain EOMI NCAT Abdomen obese distended pouch noted postop changes noted No lower extremity edema S1-S2 tachycardic sinus tach alternating with A. fib Neurologically intact moving all 4 limbs but this is somewhat incoherent because of pain/anesthesia   I have personally reviewed the following:  DATA   Labs:  BUN/creatinine 2.3-->129/3.0  Hemoglobin down from 10.1-7.6  None today  Imaging studies:  none   Scheduled Meds: . Chlorhexidine Gluconate Cloth  6 each Topical Daily  . insulin aspart  0-15 Units Subcutaneous Q4H  . lip balm  1 application Topical BID  . sodium chloride flush  10-40 mL Intracatheter Q12H   Continuous Infusions: . sodium chloride 150 mL/hr at 12/27/18 0420  . acetaminophen Stopped (12/27/18 0622)  .  diltiazem (CARDIZEM) infusion 10 mg/hr (12/27/18 0500)  . lactated ringers    . lactated ringers    . methocarbamol (ROBAXIN) IV    . ondansetron (ZOFRAN) IV    . sodium chloride      Principal Problem:   Colonic  obstruction due to metastatic colon cancer Active Problems:   Essential (primary) hypertension   Diabetes mellitus without complication (HCC)   AKI (acute kidney injury) ([place])   Iron deficiency anemia   Cancer of splenic flexure of colon   Atrial fibrillation, chronic   Anticoagulant long-term use   Cancer of sigmoid colon    History of adenomatous polyp of colon   Acute diastolic heart failure (HCC)   Liver metastases from liver cancer   CKD (chronic kidney disease) stage 3, GFR 30-59 ml/min (HCC)   Obesity (BMI 30-39.9)   Choledocholithiasis by MRCP   LOS: 1 day

## 2018-12-27 NOTE — Consult Note (Signed)
[place] Nurse ostomy follow up Patient currently in PACU.  I understand from the operative note the patient will be returning to the ICU for care.  The ICU has ostomy supplies in the clean utility room.  [place] nurse will f/u with care and teaching on Monday of next week.  Val Riles, RN, MSN, CWOCN, CNS-BC, pager [phone]

## 2018-12-27 NOTE — Progress Notes (Addendum)
Note: Portions of this report may have been transcribed using voice recognition software. A sincere effort was made to ensure accuracy; however, inadvertent computerized transcription errors may be present.   Any transcriptional errors that result from this process are unintentional.        Cassandra Allen [facility]  1946-08-16 [id number]  Patient Care Team: [name], MD as PCP - General (Family Medicine) Thompson Grayer, MD as PCP - Cardiology (Cardiology) Thompson Grayer, MD as PCP - Electrophysiology (Cardiology) Ronnette Juniper, MD as Consulting Physician (Gastroenterology) Truitt Merle, MD as Consulting Physician (Medical Oncology) Newt Minion, MD as Consulting Physician (Orthopedic Surgery)  Called by the nurse practitioner of primary service over concerns of patient decline.  The plan by Dr. Hassell Done with our group was to operate on the patient this coming morning, allowing for anticoagulation to better wear off, get her renal failure to be corrected, allow her digestive tract to be better decompressed with a nasogastric tube.  Apparently nasogastric tube output gone from minimal to high output.  Blood pressure dropping.  Volume being given.  Atrial fibrillation now with rapid rate.  UOP low.  Elevated MEWS score.  Patient being transferred to the intensive care unit.  I strongly recommended critical care consultation and reach to supervising MD as well.  Consider cardiology consultation if critical care agrees.  Patient is hemodynamically unstable and will have 100% mortality with emergency surgery at this time.  Try to aggressively resuscitate and optimize.  See if surgery can still happen later this morning if more stable, although her further decompensation and recent hospitalization for heart failure are poor prognostic factors.  Ideally needs rate control but hard to say if this is worsening atrial fibrillation or dehydration.  Agree with fluid resuscitation.  I's and O's seem to be  negative.  Only 200 mL of urine output in the last shift is very concerning for progressing into oliguric renal failure, a poor sign in a septuagenarian.  History of symptomatic anemia and bleeding.  Double check hemoglobin.  Fully anticoagulated on Eliquis.  I think last dose was yesterday, hence the delay on surgery until 48 hours to minimize bleeding risks in a patient already anemic.  Type and screen ordered.  Concern for common bile duct stone seen on CT scan a few months ago confirmed on MRCP.  Looks like it is still there on on this admission's CAT scan but there is no biliary dilatation.  Would defer to gastroenterology to see if ERCP would be warranted, but it does not seem like cholangitis is an etiology for her decline given the normal LFTs and no biliary dilatation.  Patient had just been in the hospital with diastolic heart failure last weekend & seen by cardiology.  I do not know if echocardiogram needs to be rechecked, but I ordered BNP & troponin.  Defer to medicine & CCM.   Unfortunately, not a good prognostic sign in a patient with metastatic colon cancer progressing on chemotherapy (last dose 1 week ago) to complete obstruction with acute renal failure on top of chronic kidney disease.  Seems to be going into multisystem organ failure.  Hopefully things can be turned around, and if her blood pressure can recover, then our group can try and perform surgery later this morning when she is better resuscitated.  Even then, her operative risks are extremely high.  Very difficult situation.  Without surgery in the setting of complete obstruction she will either become ischemic, perforated, dehydrated; and, ultimately, die.  If her atrial fibrillation cannot be controlled, her blood pressure cannot be recovered, and her renal failure continues to worsen into an oliguric state; surgery will be futile & a more serious discussion needs to made with medical oncology and family about goals of care &  hospice/palliation.   BP (!) 80/50 (BP Location: Left Arm)   Pulse 74   Temp 98.1 F (36.7 C) (Oral)   Resp 13   Ht 5\' 3"  (1.6 m)   Wt 79.4 kg   SpO2 (!) 88%   BMI 31.00 kg/m    Patient Active Problem List   Diagnosis Date Noted  . Cancer of sigmoid colon  12/27/2018    Priority: High  . Colonic obstruction due to metastatic colon cancer 12/26/2018    Priority: High  . Acute renal failure superimposed on stage 2 chronic kidney disease ([place]) 12/22/2018    Priority: High  . Cancer of splenic flexure of colon 10/30/2018    Priority: High  . Liver metastases from liver cancer 12/27/2018    Priority: Medium  . Atrial fibrillation, chronic 12/22/2018    Priority: Medium  . History of adenomatous polyp of colon 12/27/2018  . CKD (chronic kidney disease) stage 3, GFR 30-59 ml/min (HCC) 12/27/2018  . Obesity (BMI 30-39.9) 12/27/2018  . Anticoagulant long-term use 12/26/2018  . HLD (hyperlipidemia) 12/22/2018  . Depression 12/22/2018  . Chest pain 12/22/2018  . Acute diastolic heart failure ([place]) 12/22/2018  . Port-A-Cath in place 12/04/2018  . Goals of care, counseling/discussion 11/22/2018  . GI bleed 11/02/2018  . Rectal bleed   . Iron deficiency anemia 04/04/2018  . 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 Pain in right ankle and joints of right foot 01/10/2018  . Essential (primary) hypertension 03/25/2015  . Diabetes mellitus without complication ([place]) 31/49/7026  . AKI (acute kidney injury) ([place]) 03/25/2015    Past Medical History:  Diagnosis Date  . Acute diastolic heart failure ([place]) 12/22/2018  . Arthritis   . Atrial fibrillation, chronic 12/22/2018  . Cancer of left colon ([place]) 10/30/2018  . Cancer of sigmoid colon  12/27/2018  . Diabetes mellitus without complication ([place])   . Hypertension   . Obesity (BMI 30-39.9) 12/27/2018    Past Surgical History:  Procedure Laterality Date  . COLONOSCOPY  10/2018    Dr [name].  Large cancer at splenic flexure,  Bulky sigmoid colon mass, Numerous polyps  . IR IMAGING GUIDED PORT INSERTION  11/20/2018    Social History   Socioeconomic History  . Marital status: Divorced    Spouse name: Not on file  . Number of children: 2  . Years of education: Not on file  . Highest education level: Not on file  Occupational History  . Not on file  Social Needs  . Financial resource strain: Not on file  . Food insecurity:    Worry: Not on file    Inability: Not on file  . Transportation needs:    Medical: Not on file    Non-medical: Not on file  Tobacco Use  . Smoking status: Never Smoker  . Smokeless tobacco: Never Used  Substance and Sexual Activity  . Alcohol use: No  . Drug use: No  . Sexual activity: Not on file  Lifestyle  . Physical activity:    Days per week: Not on file    Minutes per session: Not on file  . Stress: Not on file  Relationships  . Social connections:  Talks on phone: Not on file    Gets together: Not on file    Attends religious service: Not on file    Active member of club or organization: Not on file    Attends meetings of clubs or organizations: Not on file    Relationship status: Not on file  . Intimate partner violence:    Fear of current or ex partner: Not on file    Emotionally abused: Not on file    Physically abused: Not on file    Forced sexual activity: Not on file  Other Topics Concern  . Not on file  Social History Narrative  . Not on file    Family History  Problem Relation Age of Onset  . Heart attack Mother   . Heart attack Father     Current Facility-Administered Medications  Medication Dose Route Frequency Provider Last Rate Last Dose  . 0.9 %  sodium chloride infusion   Intravenous Continuous Lovey Newcomer T, NP 100 mL/hr at 12/27/18 0121    . acetaminophen (OFIRMEV) IV 1,000 mg  1,000 mg Intravenous Q6H Earnstine Regal, PA-C   Stopped at 12/27/18 0114  . acetaminophen (TYLENOL) suppository  650 mg  650 mg Rectal Q6H PRN Michael Boston, MD      . alum & mag hydroxide-simeth (MAALOX/MYLANTA) 200-200-20 MG/5ML suspension 30 mL  30 mL Oral Q6H PRN Michael Boston, MD      . bisacodyl (DULCOLAX) suppository 10 mg  10 mg Rectal Q12H PRN Michael Boston, MD      . cefoTEtan (CEFOTAN) 2 g in sodium chloride 0.9 % 100 mL IVPB  2 g Intravenous On Call to OR Earnstine Regal, PA-C      . diltiazem (CARDIZEM) 100 mg in dextrose 5 % 100 mL (1 mg/mL) infusion  5-15 mg/hr Intravenous Titrated Lovey Newcomer T, NP 5 mL/hr at 12/27/18 0407 5 mg/hr at 12/27/18 0407  . diphenhydrAMINE (BENADRYL) injection 12.5-25 mg  12.5-25 mg Intravenous Q6H PRN Michael Boston, MD      . fentaNYL (SUBLIMAZE) injection 25-50 mcg  25-50 mcg Intravenous Q1H PRN Michael Boston, MD      . guaiFENesin-dextromethorphan (ROBITUSSIN DM) 100-10 MG/5ML syrup 10 mL  10 mL Oral Q4H PRN Michael Boston, MD      . insulin aspart (novoLOG) injection 0-15 Units  0-15 Units Subcutaneous Q4H Michael Boston, MD      . lactated ringers bolus 1,000 mL  1,000 mL Intravenous Q8H PRN Timtohy Broski, Remo Lipps, MD      . lactated ringers bolus 1,000 mL  1,000 mL Intravenous Once Michael Boston, MD      . lip balm (CARMEX) ointment 1 application  1 application Topical BID Michael Boston, MD      . magic mouthwash  15 mL Oral QID PRN Michael Boston, MD      . menthol-cetylpyridinium (CEPACOL) lozenge 3 mg  1 lozenge Oral PRN Michael Boston, MD      . methocarbamol (ROBAXIN) 1,000 mg in dextrose 5 % 50 mL IVPB  1,000 mg Intravenous Q6H PRN Michael Boston, MD      . ondansetron (ZOFRAN) injection 4 mg  4 mg Intravenous Q6H PRN Michael Boston, MD       Or  . ondansetron (ZOFRAN) 8 mg in sodium chloride 0.9 % 50 mL IVPB  8 mg Intravenous Q6H PRN Michael Boston, MD      . phenol (CHLORASEPTIC) mouth spray 1-2 spray  1-2 spray Mouth/Throat PRN Michael Boston, MD      .  prochlorperazine (COMPAZINE) injection 5-10 mg  5-10 mg Intravenous Q4H PRN Michael Boston, MD      .  simethicone (MYLICON) 40 IE/3.3IR suspension 40 mg  40 mg Oral QID PRN Michael Boston, MD         Allergies  Allergen Reactions  . Metformin And Related Nausea And Vomiting  . Prednisone Nausea And Vomiting    BP (!) 80/50 (BP Location: Left Arm)   Pulse 74   Temp 98.1 F (36.7 C) (Oral)   Resp 13   Ht 5\' 3"  (1.6 m)   Wt 79.4 kg   SpO2 (!) 88%   BMI 31.00 kg/m   Ct Abdomen Pelvis Wo Contrast  Result Date: 12/26/2018 CLINICAL DATA:  Abdominal pain, known history of colon carcinoma with liver metastatic disease EXAM: CT ABDOMEN AND PELVIS WITHOUT CONTRAST TECHNIQUE: Multidetector CT imaging of the abdomen and pelvis was performed following the standard protocol without IV contrast. COMPARISON:  MRI from 12/07/2018 and CT from 11/01/2018 FINDINGS: Lower chest: No acute abnormality. Hepatobiliary: Somewhat limited due to lack of IV contrast. Hypodense lesion is again seen in the caudate lobe consistent with the known metastatic disease. Gallbladder is well distended with multiple gallstones within. No biliary ductal dilatation is seen. Density is noted within the distal common bile duct consistent with common bile duct stone similar to that seen on prior MRI. Pancreas: Unremarkable. No pancreatic ductal dilatation or surrounding inflammatory changes. Spleen: Normal in size without focal abnormality. Adrenals/Urinary Tract: Right adrenal gland is within normal limits. Stable left adrenal lesion is seen shown to represent adenoma on prior MRI. The kidneys are within normal limits bilaterally. No renal calculi or obstructive changes are seen. The bladder is partially decompressed. Stomach/Bowel: Scattered diverticular change of the colon is noted. The transverse and ascending colon are dilated and fluid-filled. There is a caliber change identified at the mid descending colon. This would correspond with the known colonic mass seen on prior colonoscopy. Additionally there is a mass lesion within lumen of  the sigmoid colon similar to that seen on prior CT examination and colonoscopy. Small-bowel is significantly distended with fluid and air consistent with the proximal descending colon obstructing lesion. Dilatation extends from the fourth portion of the duodenum to the terminal ileum. Vascular/Lymphatic: Aortic atherosclerosis. No enlarged abdominal or pelvic lymph nodes. Reproductive: Uterus and bilateral adnexa are unremarkable. Other: No abdominal wall hernia or abnormality. No abdominopelvic ascites. Musculoskeletal: Degenerative changes of lumbar spine. IMPRESSION: Changes consistent with an obstructing lesion in the proximal descending colon consistent with the known history of colon carcinoma. The proximal colon as well as the entire small bowel is dilated secondary to the obstructive change. Second intraluminal mass within the sigmoid colon consistent with prior colonoscopy findings. Stable left adrenal lesion shown to represent adenoma on recent MRI. Hepatic metastatic disease similar to the prior MRI. Cholelithiasis without complicating factors. Electronically Signed   By: Inez Catalina M.D.   On: 12/26/2018 01:43   Dg Chest 2 View  Result Date: 12/21/2018 CLINICAL DATA:  Midline chest pain.  Shortness of breath EXAM: CHEST - 2 VIEW COMPARISON:  Chest radiograph 11/02/2018 FINDINGS: Right anterior chest wall Port-A-Cath is present with tip projecting over the superior vena cava. Monitoring leads overlie the patient. Cardiomegaly. Pulmonary vascular redistribution. Mild interstitial opacities. No pleural effusion. Thoracic spine degenerative changes. IMPRESSION: Cardiomegaly. Pulmonary vascular redistribution and mild interstitial edema. Electronically Signed   By: Lovey Newcomer M.D.   On: 12/21/2018 21:11   Dg Abdomen 1  View  Result Date: 12/26/2018 CLINICAL DATA:  Nasogastric tube placement EXAM: ABDOMEN - 1 VIEW COMPARISON:  Abdominal CT earlier today FINDINGS: Nasogastric tube tip at the distal  stomach. Diffuse gaseous bowel distention in this patient with left colonic obstruction by CT. IMPRESSION: 1. Nasogastric tube with tip at the distal stomach. 2. Known descending colonic obstruction. Electronically Signed   By: Monte Fantasia M.D.   On: 12/26/2018 04:18   Mr Abdomen W Wo Contrast  Result Date: 12/07/2018 CLINICAL DATA:  Colon cancer with liver lesions and a left adrenal abnormality, for further workup. EXAM: MRI ABDOMEN WITHOUT AND WITH CONTRAST TECHNIQUE: Multiplanar multisequence MR imaging of the abdomen was performed both before and after the administration of intravenous contrast. CONTRAST:  38mL MULTIHANCE GADOBENATE DIMEGLUMINE 529 MG/ML IV SOLN COMPARISON:  CT abdomen 11/01/2018 FINDINGS: Lower chest: Cardiomegaly. Hepatobiliary: Multiple small gallstones are present in the gallbladder. There is choledocholithiasis with a 7 mm in long axis stone in the distal CBD on image 24/2. Considerably reduced in phase signal in the liver with associated low T2 signal in the liver favoring hemochromatosis. Although on the prior exam the scattered hypodense liver lesions were not well-defined, on today's exam most of these lesions are essentially occult. The exception is the caudate lobe lesion which currently measures 3.8 by 2.7 cm, previously 4.0 by 3.3 cm by CT. There also some vague areas of faint enhancement in the lateral segment left hepatic lobe fall which could reflect residua of prior metastatic lesions. A 7 mm focus of enhancement in the right hepatic lobe on image 32/19 likewise could reflect a prior metastatic lesion but was previously 1.4 cm on the prior CT. The overall pattern is one of significant improvement. Pancreas: 5 mm nonenhancing T2 hyperintense lesion in the pancreatic head on image 23/7, likely clinically inconsequential given the context of the patient's history. Otherwise unremarkable. Spleen: In addition to a small inferior splenic cyst, there are several small foci of  enhancement in the spleen including a stable peripheral 1.1 by 0.8 cm lesion on image 30/18 which may right may represent metastatic disease, but which are technically nonspecific. Adrenals/Urinary Tract: There is considerable signal dropout of the vast majority of the left adrenal mass on out of phase images. The mass measures 3.5 by 2.8 cm. A small cystic lateral portion of the mass does not dropout in signal. Still, the overall appearance favors adenoma over a collision lesion with an adenoma. Chronic appearing bilateral perirenal stranding. No hydronephrosis. A 1.2 cm lesion with high precontrast T1 signal characteristics in the right kidney upper pole is compatible with a Bosniak category 2 cyst. Stomach/Bowel: Unremarkable Vascular/Lymphatic: Aortoiliac atherosclerotic vascular disease. No pathologic adenopathy identified. Other:  No supplemental non-categorized findings. Musculoskeletal: Lumbar spondylosis and degenerative disc disease. IMPRESSION: 1. The overall improvement with resolution of the number of the hepatic metastatic lesions. The dominant lesion in the caudate lobe is reduced in size from previous 4.0 by 3.3 cm to current 3.8 by 2.7 cm. 2. The splenic lesions are similar to prior and nonspecific. 3. 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 There is a cystic component laterally which is probably incidental rather than from a collision lesion. 4. Hepatic hemochromatosis. 5. 7 mm gallstone in the common bile duct compatible with choledocholithiasis. There also multiple gallstones in the gallbladder. 6.  Aortic Atherosclerosis (ICD10-I70.0). 7. Bosniak category 2 cyst in the right kidney upper pole. Electronically Signed   By: Van Clines M.D.   On: 12/07/2018  12:35   

## 2018-12-27 NOTE — Progress Notes (Signed)
Pt received from OR with Neo 61mcg/hr in rR arm with LR.  L arm had CARDIZEM at 53mch/hr with NSS at 150cc/HR.

## 2018-12-27 NOTE — Progress Notes (Addendum)
Rapid response called at 0220. Upon arrival to bedside at 0225, NP had already placed orders for patient to be transferred to stepdown unit. 12 lead EKG completed at bedside and then patient transported to stepdown unit accompanied by rapid response RN and primary RN. Results of EKG given to NP at bedside.

## 2018-12-27 NOTE — Progress Notes (Signed)
Cassandra Allen   DOB:[birth year]   IE#:[id number]   ZYS#:[id number]  Oncology follow-up note  Subjective: Cassandra Allen is well-known to me, has been under my care for her metastatic colon cancer.  She was admitted for bowel obstruction secondary to the colon mass.  She was transferred to stepdown unit last night for rapid A. fib with hypotension.  She responded to IV fluids, vital signs stable this morning, on Cardizem drip, not on pressor.  Her abdominal pain has improved some today, she is on NG suction with 1.2 L removed last night.   Objective:  Vitals:   12/27/18 0600 12/27/18 0611  BP: (!) 94/38 (!) 116/53  Pulse: (!) 107 (!) 122  Resp: 12 13  Temp:    SpO2: 97% 98%    Body mass index is 30.93 kg/m.  Intake/Output Summary (Last 24 hours) at 12/27/2018 0802 Last data filed at 12/27/2018 0500 Gross per 24 hour  Intake 1753.55 ml  Output 3800 ml  Net -2046.45 ml     Sclerae unicteric  Oropharynx clear  No peripheral adenopathy  Lungs clear -- no rales or rhonchi  Heart regular rate and rhythm  Abdomen distended, diffuse tenderness  MSK no focal spinal tenderness, no peripheral edema  Neuro nonfocal    CBG (last 3)  Recent Labs    12/26/18 2050 12/27/18 0023 12/27/18 0326  GLUCAP 122* 102* 98     Labs:  Lab Results  Component Value Date   WBC 6.9 12/27/2018   HGB 9.4 (L) 12/27/2018   HCT 31.2 (L) 12/27/2018   MCV 96.3 12/27/2018   PLT 284 12/27/2018   NEUTROABS 10.3 (H) 12/25/2018   CMP Latest Ref Rng & Units 12/27/2018 12/26/2018 12/25/2018  Glucose 70 - 99 mg/dL 104(H) 181(H) 271(H)  BUN 8 - 23 mg/dL 129(H) 119(H) 112(H)  Creatinine 0.44 - 1.00 mg/dL 3.08(H) 2.26(H) 2.32(H)  Sodium 135 - 145 mmol/L 139 137 134(L)  Potassium 3.5 - 5.1 mmol/L 3.9 4.1 4.3  Chloride 98 - 111 mmol/L 100 103 98  CO2 22 - 32 mmol/L 21(L) 22 21(L)  Calcium 8.9 - 10.3 mg/dL 6.2(LL) 6.6(L) 7.1(L)  Total Protein 6.5 - 8.1 g/dL - - 5.3(L)  Total Bilirubin 0.3 - 1.2 mg/dL - -  0.6  Alkaline Phos 38 - 126 U/L - - 73  AST 15 - 41 U/L - - 24  ALT 0 - 44 U/L - - 21     Urine Studies No results for input(s): UHGB, CRYS in the last 72 hours.  Invalid input(s): UACOL, UAPR, USPG, UPH, UTP, UGL, UKET, UBIL, UNIT, UROB, [place], UEPI, UWBC, [place], [place], [place], [place], [place]  Basic Metabolic Panel: Recent Labs  Lab 12/22/18 1322 12/23/18 0423 12/25/18 2254 12/26/18 0702 12/27/18 0325  NA 141 141 134* 137 139  K 4.5 4.6 4.3 4.1 3.9  CL 107 108 98 103 100  CO2 25 25 21* 22 21*  GLUCOSE 114* 126* 271* 181* 104*  BUN 35* 38* 112* 119* 129*  CREATININE 1.19* 1.34* 2.32* 2.26* 3.08*  CALCIUM 8.8* 8.1* 7.1* 6.6* 6.2*  MG 2.0  --   --   --   --   PHOS 3.6  --   --   --   --    GFR Estimated Creatinine Clearance: 16.4 mL/min (A) (by C-G formula based on SCr of 3.08 mg/dL (H)). Liver Function Tests: Recent Labs  Lab 12/21/18 2055 12/22/18 1322 12/25/18 2254  AST 14*  --  24  ALT 38  --  21  ALKPHOS 84  --  73  BILITOT 0.6  --  0.6  PROT 5.5*  --  5.3*  ALBUMIN 3.0* 3.1* 2.7*   Recent Labs  Lab 12/25/18 2254  LIPASE 24   No results for input(s): AMMONIA in the last 168 hours. Coagulation profile Recent Labs  Lab 12/22/18 0113 12/27/18 0325  INR 1.30 2.14    CBC: Recent Labs  Lab 12/21/18 2055 12/25/18 2254 12/26/18 0702 12/27/18 0325  WBC 2.6* 11.8* 13.2* 6.9  NEUTROABS  --  10.3*  --   --   HGB 9.1* 10.4* 10.1* 9.4*  HCT 30.7* 33.2* 32.6* 31.2*  MCV 97.2 95.7 96.7 96.3  PLT 202 309 308 284   Cardiac Enzymes: Recent Labs  Lab 12/22/18 0113 12/22/18 0639 12/22/18 1322 12/22/18 1949 12/27/18 0429  TROPONINI 0.04* <0.03 <0.03 <0.03 <0.03   BNP: Invalid input(s): POCBNP CBG: Recent Labs  Lab 12/26/18 1146 12/26/18 1722 12/26/18 2050 12/27/18 0023 12/27/18 0326  GLUCAP 137* 115* 122* 102* 98   D-Dimer No results for input(s): DDIMER in the last 72 hours. Hgb A1c No results for input(s): HGBA1C in the last 72 hours. Lipid  Profile No results for input(s): CHOL, HDL, LDLCALC, TRIG, CHOLHDL, LDLDIRECT in the last 72 hours. Thyroid function studies No results for input(s): TSH, T4TOTAL, T3FREE, THYROIDAB in the last 72 hours.  Invalid input(s): FREET3 Anemia work up No results for input(s): VITAMINB12, FOLATE, FERRITIN, TIBC, IRON, RETICCTPCT in the last 72 hours. Microbiology Recent Results (from the past 240 hour(s))  Surgical pcr screen     Status: None   Collection Time: 12/26/18  8:53 PM  Result Value Ref Range Status   MRSA, PCR NEGATIVE NEGATIVE Final   Staphylococcus aureus NEGATIVE NEGATIVE Final    Comment: (NOTE) The Xpert SA Assay (FDA approved for NASAL specimens in patients 70 years of age and older), is one component of a comprehensive surveillance program. It is not intended to diagnose infection nor to guide or monitor treatment. Performed at [facility], [place] [address], [place], [place] [address]       Studies:  Ct Abdomen Pelvis Wo Contrast  Result Date: 12/26/2018 CLINICAL DATA:  Abdominal pain, known history of colon carcinoma with liver metastatic disease EXAM: CT ABDOMEN AND PELVIS WITHOUT CONTRAST TECHNIQUE: Multidetector CT imaging of the abdomen and pelvis was performed following the standard protocol without IV contrast. COMPARISON:  MRI from 12/07/2018 and CT from 11/01/2018 FINDINGS: Lower chest: No acute abnormality. Hepatobiliary: Somewhat limited due to lack of IV contrast. Hypodense lesion is again seen in the caudate lobe consistent with the known metastatic disease. Gallbladder is well distended with multiple gallstones within. No biliary ductal dilatation is seen. Density is noted within the distal common bile duct consistent with common bile duct stone similar to that seen on prior MRI. Pancreas: Unremarkable. No pancreatic ductal dilatation or surrounding inflammatory changes. Spleen: Normal in size without focal abnormality. Adrenals/Urinary Tract:  Right adrenal gland is within normal limits. Stable left adrenal lesion is seen shown to represent adenoma on prior MRI. The kidneys are within normal limits bilaterally. No renal calculi or obstructive changes are seen. The bladder is partially decompressed. Stomach/Bowel: Scattered diverticular change of the colon is noted. The transverse and ascending colon are dilated and fluid-filled. There is a caliber change identified at the mid descending colon. This would correspond with the known colonic mass seen on prior colonoscopy. Additionally there is a mass lesion within lumen of the  sigmoid colon similar to that seen on prior CT examination and colonoscopy. Small-bowel is significantly distended with fluid and air consistent with the proximal descending colon obstructing lesion. Dilatation extends from the fourth portion of the duodenum to the terminal ileum. Vascular/Lymphatic: Aortic atherosclerosis. No enlarged abdominal or pelvic lymph nodes. Reproductive: Uterus and bilateral adnexa are unremarkable. Other: No abdominal wall hernia or abnormality. No abdominopelvic ascites. Musculoskeletal: Degenerative changes of lumbar spine. IMPRESSION: Changes consistent with an obstructing lesion in the proximal descending colon consistent with the known history of colon carcinoma. The proximal colon as well as the entire small bowel is dilated secondary to the obstructive change. Second intraluminal mass within the sigmoid colon consistent with prior colonoscopy findings. Stable left adrenal lesion shown to represent adenoma on recent MRI. Hepatic metastatic disease similar to the prior MRI. Cholelithiasis without complicating factors. Electronically Signed   By: Inez Catalina M.D.   On: 12/26/2018 01:43   Dg Abdomen 1 View  Result Date: 12/26/2018 CLINICAL DATA:  Nasogastric tube placement EXAM: ABDOMEN - 1 VIEW COMPARISON:  Abdominal CT earlier today FINDINGS: Nasogastric tube tip at the distal stomach. Diffuse  gaseous bowel distention in this patient with left colonic obstruction by CT. IMPRESSION: 1. Nasogastric tube with tip at the distal stomach. 2. Known descending colonic obstruction. Electronically Signed   By: Monte Fantasia M.D.   On: 12/26/2018 04:18    Assessment: 74 y.o. with metastatic colon cancer, iron deficient anemia, admitted for bowel obstruction secondary to colon mass  1.  Bowel obstruction secondary to colon cancer 2.  Atrial fibrillation with rapid ventricular response, rate controlled now  3.  Hypotension secondary to dehydration and A. fib, resolved now 4. Acute on chronic renal failure, worsening 5.  Anemia secondary to GI bleeding from the tumor, and chemotherapy 6.  Metastatic left colon cancer, status post chemotherapy FOLFOX for 2 cycles, last dose 12/18/2018 7.  History of diastolic heart failure, recent hospitalization for CHF exacerbation  8.  She was on Eliquis for atrial fibrillation, last dose 2/12 evening    Plan:  -I discussed with patient, her son, and Dr. Hassell Done this morning.  Due to the complete bowel obstruction, and worsening renal failure, decompensation last night, she is at extremely high risk of bowel ischemia, perforation, sepsis, and multiorgan failure, and ultimate death if no surgery.  However, her surgical risk is also extremely high, due to the comorbidities, and immune compromised due to chemotherapy. Pt is not ready for hospice and dying, and agrees with surgery.  From a hematological and oncology standpoint, I do not see any contraindications for surgery. Dr. Hassell Done will likely take her to OR later this morning or this afternoon.  Her last dose Eliquis has been 36-48 hours, risk of bleeding is acceptable, we feels the antagonist is not necessary at this point  -I will f/u    Truitt Merle, MD 12/27/2018  8:02 AM

## 2018-12-27 NOTE — Interval H&P Note (Signed)
History and Physical Interval Note:  12/27/2018 10:19 AM  Cassandra Allen  has presented today for surgery, with the diagnosis of colon obstruction  The various methods of treatment have been discussed with the patient and family. After consideration of risks, benefits and other options for treatment, the patient has consented to  Procedure(s): EXPLORATORY LAPAROTOMY (N/A) as a surgical intervention .  The patient's history has been reviewed, patient examined, no change in status, stable for surgery.  I have reviewed the patient's chart and labs.  Questions were answered to the patient's satisfaction.     [name]

## 2018-12-27 NOTE — Progress Notes (Addendum)
eLink Physician-Brief Progress Note Patient Name: Cassandra Allen DOB: 06-11-1946 MRN: [id number]   Date of Service  12/27/2018  HPI/Events of Note  73 yr old female with metastatic colon cancer with experimental FOLFOX chemo on 5 th, now colon obstruction from mass. NF 2 lit drained. Went into A fib RVR with low BP, did not respond to 1 Lit from progressive unit. Discussed with NP.  Bed side RN via a Camera.  Able to protect airways. Urine output 200. HR 120 to 135. EKG seen. EF normal. MAP < 65. No diaphoresis. eliquis on hold for surgery for AM, once stable.   eICU Interventions  1 Lit LR bolus Cardizem gtt for now. If hypotension to consider amiodarone.  Continue abx.  Follow labs.       Intervention Category Major Interventions: Hypotension - evaluation and management;Arrhythmia - evaluation and management Minor Interventions: Communication with other healthcare providers and/or family  Elmer Sow 12/27/2018, 3:49 AM

## 2018-12-27 NOTE — Progress Notes (Addendum)
Called multiple times throughout the night regarding pt declining condition. According to bedside RN pt has put out a total of approximately 2L of gastric secretions from her NG tube. She has become tachycardiac, hypotensive and hypoxic. On assessment, pt appears lethargic. She is responsive to verbal stimuli with one to two word answers. Her sats are high 80's and she complains of severe abdominal pain. Abdomen is severely distended and tender to touch. Will transfer pt to ICU/SDU for further monitoring  Colon cancer with metastases - Increased IVF to 130ml/hr. 2L fluid bolus given -Surgery previous consulted during the day for urgent hemicolectomy. Surgery consulted again who states they will not take her to surgery until pt is stable.  -Moved to ICU/SDU. PCCM consulted  Afib RVR - Amiodarone suggested by PCCM for management of Afib if BP allows. Currently unable to place on drip SBP in the 80's.  Lovey Newcomer, NP  [facility] [phone]-7a [phone]  CRITICAL CARE Performed by: Neila Gear   Total critical care time: 45 minutes  Critical care time was exclusive of separately billable procedures and treating other patients.  Critical care was necessary to treat or prevent imminent or life-threatening deterioration.  Critical care was time spent personally by me on the following activities: development of treatment plan with patient and/or surrogate as well as nursing, discussions with consultants, evaluation of patient's response to treatment, examination of patient, obtaining history from patient or surrogate, ordering and performing treatments and interventions, ordering and review of laboratory studies, ordering and review of radiographic studies, pulse oximetry and re-evaluation of patient's condition.

## 2018-12-27 NOTE — Anesthesia Preprocedure Evaluation (Addendum)
Anesthesia Evaluation  Patient identified by MRN, date of birth, ID band Patient awake    Reviewed: Allergy & Precautions, NPO status , Patient's Chart, lab work & pertinent test results  Airway Mallampati: II  TM Distance: >3 FB Neck ROM: Full    Dental  (+) Dental Advisory Given   Pulmonary neg pulmonary ROS,    breath sounds clear to auscultation       Cardiovascular hypertension, Pt. on medications + dysrhythmias (on diltiazem gtt) Atrial Fibrillation  Rhythm:Irregular Rate:Tachycardia     Neuro/Psych negative neurological ROS     GI/Hepatic Neg liver ROS, Obstructing colon mass   Endo/Other  diabetes, Type 2  Renal/GU ARF and CRFRenal disease     Musculoskeletal   Abdominal   Peds  Hematology  (+) anemia ,   Anesthesia Other Findings   Reproductive/Obstetrics                            Lab Results  Component Value Date   WBC 6.9 12/27/2018   HGB 9.4 (L) 12/27/2018   HCT 31.2 (L) 12/27/2018   MCV 96.3 12/27/2018   PLT 284 12/27/2018   Lab Results  Component Value Date   CREATININE 3.08 (H) 12/27/2018   BUN 129 (H) 12/27/2018   NA 139 12/27/2018   K 3.9 12/27/2018   CL 100 12/27/2018   CO2 21 (L) 12/27/2018    Anesthesia Physical Anesthesia Plan  ASA: IV  Anesthesia Plan: General   Post-op Pain Management:    Induction: Intravenous, Rapid sequence and Cricoid pressure planned  PONV Risk Score and Plan: 3 and Dexamethasone, Ondansetron and Treatment may vary due to age or medical condition  Airway Management Planned: Oral ETT  Additional Equipment:   Intra-op Plan:   Post-operative Plan: Extubation in OR and Possible Post-op intubation/ventilation  Informed Consent: I have reviewed the patients History and Physical, chart, labs and discussed the procedure including the risks, benefits and alternatives for the proposed anesthesia with the patient or authorized  representative who has indicated his/her understanding and acceptance.     Dental advisory given  Plan Discussed with: CRNA  Anesthesia Plan Comments:         Anesthesia Quick Evaluation

## 2018-12-28 DIAGNOSIS — I4891 Unspecified atrial fibrillation: Secondary | ICD-10-CM

## 2018-12-28 DIAGNOSIS — I959 Hypotension, unspecified: Secondary | ICD-10-CM

## 2018-12-28 LAB — CALCIUM, IONIZED: Calcium, Ionized, Serum: 3.4 mg/dL — ABNORMAL LOW (ref 4.5–5.6)

## 2018-12-28 LAB — COMPREHENSIVE METABOLIC PANEL
ALT: 25 U/L (ref 0–44)
AST: 38 U/L (ref 15–41)
Albumin: 1.9 g/dL — ABNORMAL LOW (ref 3.5–5.0)
Alkaline Phosphatase: 62 U/L (ref 38–126)
Anion gap: 14 (ref 5–15)
BUN: 114 mg/dL — AB (ref 8–23)
CO2: 20 mmol/L — ABNORMAL LOW (ref 22–32)
Calcium: 5.9 mg/dL — CL (ref 8.9–10.3)
Chloride: 108 mmol/L (ref 98–111)
Creatinine, Ser: 2.65 mg/dL — ABNORMAL HIGH (ref 0.44–1.00)
GFR calc Af Amer: 20 mL/min — ABNORMAL LOW (ref >60)
GFR calc non Af Amer: 17 mL/min — ABNORMAL LOW (ref >60)
Glucose, Bld: 156 mg/dL — ABNORMAL HIGH (ref 70–99)
Potassium: 3.3 mmol/L — ABNORMAL LOW (ref 3.5–5.1)
SODIUM: 142 mmol/L (ref 135–145)
Total Bilirubin: 0.4 mg/dL (ref 0.3–1.2)
Total Protein: 4.5 g/dL — ABNORMAL LOW (ref 6.5–8.1)

## 2018-12-28 LAB — CBC WITH DIFFERENTIAL/PLATELET
Abs Immature Granulocytes: 0.36 10*3/uL — ABNORMAL HIGH (ref 0.00–0.07)
Basophils Absolute: 0 10*3/uL (ref 0.0–0.1)
Basophils Relative: 1 %
Eosinophils Absolute: 0 10*3/uL (ref 0.0–0.5)
Eosinophils Relative: 0 %
HCT: 33.7 % — ABNORMAL LOW (ref 36.0–46.0)
Hemoglobin: 10.3 g/dL — ABNORMAL LOW (ref 12.0–15.0)
Immature Granulocytes: 5 %
LYMPHS ABS: 0.3 10*3/uL — AB (ref 0.7–4.0)
Lymphocytes Relative: 4 %
MCH: 29.3 pg (ref 26.0–34.0)
MCHC: 30.6 g/dL (ref 30.0–36.0)
MCV: 96 fL (ref 80.0–100.0)
Monocytes Absolute: 2.1 10*3/uL — ABNORMAL HIGH (ref 0.1–1.0)
Monocytes Relative: 30 %
NEUTROS ABS: 4.3 10*3/uL (ref 1.7–7.7)
Neutrophils Relative %: 60 %
Platelets: 250 10*3/uL (ref 150–400)
RBC: 3.51 MIL/uL — ABNORMAL LOW (ref 3.87–5.11)
RDW: 16.3 % — ABNORMAL HIGH (ref 11.5–15.5)
WBC: 7.1 10*3/uL (ref 4.0–10.5)
nRBC: 0.3 % — ABNORMAL HIGH (ref 0.0–0.2)

## 2018-12-28 LAB — GLUCOSE, CAPILLARY
GLUCOSE-CAPILLARY: 145 mg/dL — AB (ref 70–99)
Glucose-Capillary: 129 mg/dL — ABNORMAL HIGH (ref 70–99)
Glucose-Capillary: 93 mg/dL (ref 70–99)
Glucose-Capillary: 89 mg/dL (ref 70–99)
Glucose-Capillary: 90 mg/dL (ref 70–99)

## 2018-12-28 LAB — PROTIME-INR
INR: 1.95
Prothrombin Time: 22 seconds — ABNORMAL HIGH (ref 11.4–15.2)

## 2018-12-28 LAB — MAGNESIUM: MAGNESIUM: 3.6 mg/dL — AB (ref 1.7–2.4)

## 2018-12-28 LAB — PHOSPHORUS: Phosphorus: 7.4 mg/dL — ABNORMAL HIGH (ref 2.5–4.6)

## 2018-12-28 MED ORDER — SODIUM CHLORIDE 0.9 % IV SOLN
2.0000 g | INTRAVENOUS | Status: DC
  Filled 2018-12-28: qty 2

## 2018-12-28 MED ORDER — HEPARIN SODIUM (PORCINE) 5000 UNIT/ML IJ SOLN
5000.0000 [IU] | Freq: Three times a day (TID) | INTRAMUSCULAR | Status: DC
  Administered 2018-12-28 – 2018-12-29 (×3): 5000 [IU] via SUBCUTANEOUS
  Filled 2018-12-28 (×3): qty 1

## 2018-12-28 MED ORDER — DIGOXIN 0.25 MG/ML IJ SOLN
0.1250 mg | INTRAMUSCULAR | Status: DC
  Administered 2018-12-29: 0.125 mg via INTRAVENOUS
  Filled 2018-12-28: qty 2

## 2018-12-28 MED ORDER — POTASSIUM CHLORIDE 10 MEQ/50ML IV SOLN
10.0000 meq | INTRAVENOUS | Status: AC
  Administered 2018-12-28 (×3): 10 meq via INTRAVENOUS
  Filled 2018-12-28 (×3): qty 50

## 2018-12-28 MED ORDER — LACTATED RINGERS IV SOLN
INTRAVENOUS | Status: DC
  Administered 2018-12-28 – 2018-12-29 (×3): via INTRAVENOUS

## 2018-12-28 MED ORDER — LACTATED RINGERS IV SOLN: INTRAVENOUS | Status: DC

## 2018-12-28 MED ORDER — DIGOXIN 0.25 MG/ML IJ SOLN
0.2500 mg | Freq: Four times a day (QID) | INTRAMUSCULAR | Status: AC
  Administered 2018-12-28 (×2): 0.25 mg via INTRAVENOUS
  Filled 2018-12-28 (×2): qty 2

## 2018-12-28 MED ORDER — CALCIUM GLUCONATE-NACL 1-0.675 GM/50ML-% IV SOLN
1.0000 g | Freq: Once | INTRAVENOUS | Status: AC
  Administered 2018-12-28: 1000 mg via INTRAVENOUS
  Filled 2018-12-28: qty 50

## 2018-12-28 MED ORDER — DIGOXIN 0.25 MG/ML IJ SOLN
0.5000 mg | Freq: Once | INTRAMUSCULAR | Status: AC
  Administered 2018-12-28: 0.5 mg via INTRAVENOUS
  Filled 2018-12-28: qty 2

## 2018-12-28 MED ORDER — LACTATED RINGERS IV BOLUS
1000.0000 mL | Freq: Once | INTRAVENOUS | Status: AC
  Administered 2018-12-28: 1000 mL via INTRAVENOUS

## 2018-12-28 NOTE — Progress Notes (Signed)
CRITICAL VALUE ALERT  Critical Value:  Ca 5.9  Date & Time Notied:  12/28/18. 0510  Provider Notified: BLount NP   Orders Received/Actions taken: New orders

## 2018-12-28 NOTE — Progress Notes (Signed)
[facility] PROGRESS NOTE  Cassandra Allen AJO:[id number] DOB: [birth year] DOA: 12/25/2018 PCP: [name], MD   Narrative:  73 year old Caucasian female Newly diagnosed colon cancer 10/25/2018 after colonoscopy with metastases started on FOLFOX every 2 weekly 12/04/2018 Iron deficiency anemia on iron sucrose Diabetes mellitus type 2 HTN CKD Atrial fibrillation diagnosed 04/2018 [name] score about 3 on anticoagulation  Presented to emergency room with mention to complaints and probable new obstructing mass in addition to AKI Baseline creatinine went from 1.3-2.39  General surgery consulted  Patient developed A. fib with RVR overnight  2/13 and was transferred to stepdown unit   A & Plan A. fib with RVR chads score >3 weaning off of Cardizem-Load with Dig per CCM Resume anticoagulation as per general surgery when they feel safe to do so Peri-op Hypotension and anemia  D/c cardizem--bolusing fluids--hemodynamic management  deferred to CCM--on Neosynpehrine and hopeful to wean to off today Obstructive colonic mass in the setting of newly diagnosed colon cancer 10/25/2018 Await Pathology report--will need Oncology follow-up close to d/c TPN started per CCM on 2/15 further management deferred to Gen surgery continue Cefotetan as per Gen surgery Iron deficiency with acute blood loss anemia superimposed upon the same hemoglobin at baseline is in the 10 range-his hematocrit has dropped postsurgery to 7.6 rec'd 2 units of PRBC 2/14 Diabetes mellitus type 2 Check sugars every 4 hourly will change fluids to D5 normal saline 150 cc/h for 18 hours HTN with peri-op hypotension On digoxin now for HR--see above Acute superimposed on CKD stage II Baseline creatinine 1.3 on admission about 2.2-cont saline Rpt am labs and anticipate recovery over next several days Morbid obesity BMI 30    DVT heparin at this time code Status: Full communication: d/w son in detail 2/14--called  but no answer on phone today disposition Plan: Inpatient   Kade Demicco, MD  [facility] Via amion app [email or web address] 7PM-7AM contact night coverage as above 12/28/2018, 11:25 AM  LOS: 2 days   Consultants:  General surgery  Cardiology  Procedures:  Status post resection laparotomy Hartmann pouch placement 12/27/2018  Antimicrobials:  Peri-operative at this time  Interval history/Subjective:  Awake alert coherent Feels "terrible"  No cp abd pain is present and managed to some degree by IV pain meds - flatus - stool  Objective:  Vitals:  Vitals:   12/28/18 0800 12/28/18 1120  BP:    Pulse:  (!) 105  Resp:  (!) 29  Temp: 98.7 F (37.1 C)   SpO2:  95%    Exam:  Coherent, foll commands, NGT still in place seems to be in pain EOMI NCAT Abdomen obese distended pouch noted postop changes noted No lower extremity edema S1-S2 tachycardic afib noted Neurologically intact moving all 4 limbs but this is somewhat incoherent because of pain/anesthesia   I have personally reviewed the following:  DATA   Labs:  BUN/creatinine 2.3-->129/3.0-->114/2.65  Hemoglobin down from 10.1-7.6--->2 u prbc 2/14-->10.3  None today  Imaging studies:  none   Scheduled Meds: . sodium chloride  250 mL Intravenous Once  . sodium chloride   Intravenous Once  . acetaminophen  650 mg Oral Once  . chlorhexidine  15 mL Mouth Rinse BID  . Chlorhexidine Gluconate Cloth  6 each Topical Daily  . digoxin  0.25 mg Intravenous Q6H   Followed by  . [START ON 12/29/2018] digoxin  0.125 mg Intravenous Q48H  . diphenhydrAMINE  25 mg Oral Once  . insulin aspart  0-15 Units Subcutaneous Q4H  .  lip balm  1 application Topical BID  . mouth rinse  15 mL Mouth Rinse q12n4p  . sodium chloride flush  10-40 mL Intracatheter Q12H   Continuous Infusions: . sodium chloride Stopped (12/27/18 2232)  . acetaminophen Stopped (12/28/18 0529)  . cefoTEtan (CEFOTAN) IV Stopped (12/28/18 1119)   . lactated ringers 1,000 mL (12/28/18 1055)  . lactated ringers     Followed by  . [START ON 12/29/2018] lactated ringers    . methocarbamol (ROBAXIN) IV 1,000 mg (12/28/18 1119)  . ondansetron (ZOFRAN) IV    . phenylephrine (NEO-SYNEPHRINE) Adult infusion 100 mcg/min (12/28/18 1009)    Principal Problem:   Colonic obstruction due to metastatic colon cancer Active Problems:   Essential (primary) hypertension   Diabetes mellitus without complication (HCC)   AKI (acute kidney injury) ([place])   Iron deficiency anemia   Cancer of splenic flexure of colon   Atrial fibrillation, chronic   Anticoagulant long-term use   Cancer of sigmoid colon    History of adenomatous polyp of colon   Acute diastolic heart failure (HCC)   Liver metastases from liver cancer   CKD (chronic kidney disease) stage 3, GFR 30-59 ml/min (HCC)   Obesity (BMI 30-39.9)   Choledocholithiasis by MRCP   Status post partial colectomy Feb 2020   LOS: 2 days

## 2018-12-28 NOTE — Consult Note (Signed)
NAME:  Cassandra Allen, MRN:  [id number], DOB:  Sep 06, 1946, LOS: 2 ADMISSION DATE:  12/25/2018, CONSULTATION DATE:  12/28/2018 REFERRING MD:  Verlon Au - TR[facility], CHIEF COMPLAINT:  Hypotension.   HPI/course in hospital  73 year old woman who underwent hemicolectomy for obstructing sigmoid cancer.   We were asked to see for persistent post-operative hypotension and uncontrolled Afib now POD1.  Intraoperative findings of ischemia of sigmoid colon, associated fetid smell, but no frank peritoneal soiling.   Admitted to ICU on diltiazem IV and phenylephrine 2/14.  Past Medical History   Past Medical History:  Diagnosis Date  . Acute diastolic heart failure ([place]) 12/22/2018  . Arthritis   . Atrial fibrillation, chronic on Eliquis and diltiazem 12/22/2018  . Cancer of left colon ([place]) 10/30/2018  . Cancer of sigmoid colon  12/27/2018  . Diabetes mellitus without complication ([place])   . Hypertension   . Obesity (BMI 30-39.9) 12/27/2018     Past Surgical History:  Procedure Laterality Date  . COLONOSCOPY  10/2018   Dr [name].  Large cancer at splenic flexure,  Bulky sigmoid colon mass, Numerous polyps  . IR IMAGING GUIDED PORT INSERTION  11/20/2018     Review of Systems:   Review of Systems  Unable to perform ROS: Critical illness    Social History   reports that she has never smoked. She has never used smokeless tobacco. She reports that she does not drink alcohol or use drugs.   Family History   Her family history includes Heart attack in her father and mother.   Allergies Allergies  Allergen Reactions  . Metformin And Related Nausea And Vomiting  . Prednisone Nausea And Vomiting     Home Medications  Prior to Admission medications   Medication Sig Start Date End Date Taking? Authorizing Provider  apixaban (ELIQUIS) 5 MG TABS tablet TAKE 1 TABLET (5 MG TOTAL) BY MOUTH 2 (TWO) TIMES DAILY. Patient taking differently: Take 5 mg by mouth 2 (two) times daily.  09/19/18  Yes [name], NP  diltiazem (CARDIZEM CD) 120 MG 24 hr capsule Take 1 capsule (120 mg total) by mouth 2 (two) times daily. 11/15/18  Yes [name], NP  furosemide (LASIX) 20 MG tablet Lasix 20 mg po daily PRN for weight of greater than 3 lbs in a day or 5 lbs in a week 12/23/18  Yes Bonnell Public, MD  HYDROcodone-acetaminophen (NORCO) 7.5-325 MG tablet Take 1 tablet by mouth 2 (two) times daily as needed (pain).    Yes [provider]  insulin detemir (LEVEMIR) 100 UNIT/ML injection Inject 0.07 mLs (7 Units total) into the skin daily before breakfast. 12/24/18  Yes Ogbata, Babs Bertin, MD  liraglutide (VICTOZA) 18 MG/3ML SOPN Inject 1.8 mg into the skin daily before breakfast.    Yes [provider]  Multiple Vitamin (MULTIVITAMIN WITH MINERALS) TABS tablet Take 2 tablets by mouth daily at 12 noon.    Yes [provider]  oxybutynin (DITROPAN) 5 MG tablet Take 20 mg by mouth daily.   Yes [provider]  oxybutynin (DITROPAN-XL) 10 MG 24 hr tablet Take 10 mg by mouth daily. 12/16/18  Yes [provider]  sertraline (ZOLOFT) 100 MG tablet Take 100 mg by mouth daily.    Yes [provider]  simvastatin (ZOCOR) 20 MG tablet Take 20 mg by mouth at bedtime.    Yes [provider]     Interim history/subjective:  Feels generally unwell. Generalized abdominal  pain. She reports minimal oral intake for 5 days already  Objective   Blood pressure (!) 109/58, pulse (!) 110, temperature 98.7 F (37.1 C), temperature source Oral, resp. rate 15, height 5\' 3"  (1.6 m), weight 82.3 kg, SpO2 100 %.        Intake/Output Summary (Last 24 hours) at 12/28/2018 0956 Last data filed at 12/28/2018 0645 Gross per 24 hour  Intake 7612.14 ml  Output 3250 ml  Net 4362.14 ml   Filed Weights   12/25/18 2154 12/27/18 0500 12/28/18 0500  Weight: 79.4 kg 79.2 kg 82.3 kg    Examination: Physical Exam  Constitutional: She is oriented to person, place,  and time. She has a sickly appearance.  Appears in pain  HENT:  Dry mucus membranes.  Neck: No JVD (Flat JVP) present.  Cardiovascular: Normal heart sounds. An irregularly irregular rhythm present. Tachycardia present.  Respiratory: Breath sounds normal. No respiratory distress.  GI: She exhibits distension. Bowel sounds are decreased. There is generalized abdominal tenderness.  Minimal ostomy output.  Neurological: She is alert and oriented to person, place, and time. GCS eye subscore is 4. GCS verbal subscore is 5. GCS motor subscore is 6.  Mild tremor  Skin: Skin is warm and dry.  Clean incision.     Ancillary tests (personally reviewed)  CBC: Recent Labs  Lab 12/25/18 2254 12/26/18 0702 12/27/18 0325 12/27/18 1436 12/28/18 0323  WBC 11.8* 13.2* 6.9 2.8* 7.1  NEUTROABS 10.3*  --   --   --  4.3  HGB 10.4* 10.1* 9.4* 7.6* 10.3*  HCT 33.2* 32.6* 31.2* 25.4* 33.7*  MCV 95.7 96.7 96.3 98.8 96.0  PLT 309 308 284 261 001    Basic Metabolic Panel: Recent Labs  Lab 12/22/18 1322 12/23/18 0423 12/25/18 2254 12/26/18 0702 12/27/18 0325 12/27/18 1625 12/28/18 0323  NA 141 141 134* 137 139  --  142  K 4.5 4.6 4.3 4.1 3.9  --  3.3*  CL 107 108 98 103 100  --  108  CO2 25 25 21* 22 21*  --  20*  GLUCOSE 114* 126* 271* 181* 104* 134* 156*  BUN 35* 38* 112* 119* 129*  --  114*  CREATININE 1.19* 1.34* 2.32* 2.26* 3.08*  --  2.65*  CALCIUM 8.8* 8.1* 7.1* 6.6* 6.2*  --  5.9*  MG 2.0  --   --   --   --   --  3.6*  PHOS 3.6  --   --   --   --   --   --    GFR: Estimated Creatinine Clearance: 19.5 mL/min (A) (by C-G formula based on SCr of 2.65 mg/dL (H)). Recent Labs  Lab 12/26/18 0702 12/27/18 0325 12/27/18 1436 12/28/18 0323  WBC 13.2* 6.9 2.8* 7.1    Liver Function Tests: Recent Labs  Lab 12/21/18 2055 12/22/18 1322 12/25/18 2254 12/28/18 0323  AST 14*  --  24 38  ALT 38  --  21 25  ALKPHOS 84  --  73 62  BILITOT 0.6  --  0.6 0.4  PROT 5.5*  --  5.3* 4.5*    ALBUMIN 3.0* 3.1* 2.7* 1.9*   Recent Labs  Lab 12/25/18 2254  LIPASE 24   No results for input(s): AMMONIA in the last 168 hours.  ABG No results found for: PHART, PCO2ART, PO2ART, HCO3, TCO2, ACIDBASEDEF, O2SAT   Coagulation Profile: Recent Labs  Lab 12/22/18 0113 12/27/18 0325 12/28/18 0323  INR 1.30 2.14 1.95  Cardiac Enzymes: Recent Labs  Lab 12/22/18 0113 12/22/18 0639 12/22/18 1322 12/22/18 1949 12/27/18 0429  TROPONINI 0.04* <0.03 <0.03 <0.03 <0.03    HbA1C: Hgb A1c MFr Bld  Date/Time Value Ref Range Status  08/09/2018 10:03 AM 5.6 4.8 - 5.6 % Final    Comment:    (NOTE) Pre diabetes:          5.7%-6.4% Diabetes:              >6.4% Glycemic control for   <7.0% adults with diabetes   03/26/2015 01:00 AM 7.0 (H) 4.8 - 5.6 % Final    Comment:    (NOTE)         Pre-diabetes: 5.7 - 6.4         Diabetes: >6.4         Glycemic control for adults with diabetes: <7.0     CBG: Recent Labs  Lab 12/27/18 1611 12/27/18 1937 12/27/18 2323 12/28/18 0340 12/28/18 0750  GLUCAP 17* 142* 123* 129* 145*    Assessment & Plan:  Critically ill due to hypotension requiring titration of phenylephrine to keep MAP>65 Critically ill due to Afib with poor rate rate control requiring titration of diltiazem to prevent HF decompensation. Status hemicolectomy with ongoing NG output and evidence of possible bowel ischemia at time of operation - suggests that she is likely to have delayed bowel function and ongoing third space losses. Acute kidney injury  PLAN  IV fluid resuscitation to replace NGT losses and estimated 3rd space losses. Convert diltiazem to digoxin for rate control. Wean phenylephrine to off as tolerated.  Initiate early TPN as patient is at high nutritional risk.  Best practice:  Diet: TPN Pain/Anxiety/Delirium protocol (if indicated): fentanyl prn VAP protocol (if indicated): N/A DVT prophylaxis: UFH tid GI prophylaxis: N/A  Glucose  control: Phase 1 protocol Mobility: Up to chair with assistance. Code Status: Full Family Communication: None present Disposition: ICU level of care.   Critical care time: 30 min, including chart review, examination of the patient, multidisciplinary rounding, assessment of volume status and modification of vasopressor and fluid therapy.    Kipp Brood, MD [facility] ICU Physician [place]  Pager: [phone] Mobile: [phone] After hours: [phone].  12/28/2018, 9:56 AM

## 2018-12-28 NOTE — Progress Notes (Signed)
[facility] Progress Note  1 Day Post-Op  Subjective: CC: no complaint  She is resting comfortably this morning, denies pain currently. Hypoglycemic event overnight. She is on a dilt drip and neo at 100...remains hypotensive   Objective: Vital signs in last 24 hours: Temp:  [97.3 F (36.3 C)-98.6 F (37 C)] 98.3 F (36.8 C) (02/15 0354) Pulse Rate:  [58-142] 103 (02/15 0515) Resp:  [11-26] 18 (02/15 0515) BP: (82-141)/(24-119) 111/48 (02/15 0515) SpO2:  [84 %-100 %] 100 % (02/15 0515) Weight:  [82.3 kg] 82.3 kg (02/15 0500) Last BM Date: 12/24/18  Intake/Output from previous day: 02/14 0701 - 02/15 0700 In: 8519.5 [P.O.:40; I.V.:5667.7; Blood:961.7; NG/GT:400; IV Piggyback:1450.1] Out: 3450 [Urine:1850; Emesis/NG output:700; Blood:100] Intake/Output this shift: Total I/O In: 4304.2 [P.O.:40; I.V.:3345.4; Blood:568.7; NG/GT:50; IV Piggyback:300.1] Out: 4268 [Urine:1250; Emesis/NG output:400]  PE: Gen:  Alert, NAD, seems a little confused but is oriented x 3 Card:  Irregularly irregular, tachycardic Pulm:  Unlabored respirations Abd: Soft, somewhat generalized TTP, distended. Midline laparotomy is c/d/i without cellulitis. RLQ colostomy is dark and partially retracted, no output Skin: warm and dry, no rashes    Lab Results:  Recent Labs    12/27/18 1436 12/28/18 0323  WBC 2.8* 7.1  HGB 7.6* 10.3*  HCT 25.4* 33.7*  PLT 261 250   BMET Recent Labs    12/27/18 0325 12/27/18 1625 12/28/18 0323  NA 139  --  142  K 3.9  --  3.3*  CL 100  --  108  CO2 21*  --  20*  GLUCOSE 104* 134* 156*  BUN 129*  --  PENDING  CREATININE 3.08*  --  2.65*  CALCIUM 6.2*  --  5.9*   PT/INR Recent Labs    12/27/18 0325 12/28/18 0323  LABPROT 23.7* 22.0*  INR 2.14 1.95   CMP     Component Value Date/Time   NA 142 12/28/2018 0323   K 3.3 (L) 12/28/2018 0323   CL 108 12/28/2018 0323   CO2 20 (L) 12/28/2018 0323   GLUCOSE 156 (H) 12/28/2018 0323   BUN  PENDING 12/28/2018 0323   CREATININE 2.65 (H) 12/28/2018 0323   CREATININE 1.04 (H) 12/18/2018 1029   CALCIUM 5.9 (LL) 12/28/2018 0323   PROT 4.5 (L) 12/28/2018 0323   ALBUMIN 1.9 (L) 12/28/2018 0323   AST 38 12/28/2018 0323   AST 60 (H) 12/18/2018 1029   ALT 25 12/28/2018 0323   ALT 117 (H) 12/18/2018 1029   ALKPHOS 62 12/28/2018 0323   BILITOT 0.4 12/28/2018 0323   BILITOT 0.5 12/18/2018 1029   GFRNONAA 17 (L) 12/28/2018 0323   GFRNONAA 54 (L) 12/18/2018 1029   GFRAA 20 (L) 12/28/2018 0323   GFRAA >60 12/18/2018 1029   Lipase     Component Value Date/Time   LIPASE 24 12/25/2018 2254       Studies/Results: No results found.  Anti-infectives: Anti-infectives (From admission, onward)   Start     Dose/Rate Route Frequency Ordered Stop   12/27/18 2200  cefoTEtan (CEFOTAN) 2 g in sodium chloride 0.9 % 100 mL IVPB     2 g 200 mL/hr over 30 Minutes Intravenous Every 12 hours 12/27/18 1449     12/27/18 1039  sodium chloride 0.9 % with cefoTEtan (CEFOTAN) ADS Med    Note to Pharmacy:  Chrystine Oiler: cabinet override      12/27/18 1039 12/27/18 1130   12/26/18 1230  cefoTEtan (CEFOTAN) 2 g in sodium chloride 0.9 % 100 mL IVPB  2 g 200 mL/hr over 30 Minutes Intravenous On call to O.R. 12/26/18 1224 12/27/18 0559       Assessment/Plan Recent admission for chest pain Atrial fibrillation on Eliquis -last dose 12/25/2018- on dilt drip, rate poorly controlled and hypotensive Hypertension- not an issue currently she is hypotensive Acute on chronic kidney disease stage III- creatinine downtrending, UOP adequate Type 2 diabetes- SSI Hx depression Anemia- appropriate response to transfusion yesterday Hypokalemia- replace IV, gently given AKI Malnutrition- prealbumin 10 preop  Obstructing cancer of the left and sigmoid colon with near total obstruction - moderately differentiated adenocarcinomaof left colon with obstructing mass near the splenic flexure as well as a  second colon cancer in the sigmoid colon - currently on chemotherapy - last dose 12/18/18? -s/p Laparotomy with mobilization of the splenic flexure and resection of the  sigmoid, descending and distal transverse colon with [place] pouch and end colostomy in the right upper quadrant, 12/27/18 Dr. Hassell Done Liver metastasis   FEN: N.p.o./IV fluids ID:  cefotetan pre-op DVT: Eliquis - last dose 12/25/18  Plan: Continue NPO, IVF, await return of bowel function. She is high risk for ileus given significant obstruction preop. Consider TPN. Given hypotension requiring fairly high dose of pressor and poor rate control on dilt, consider alternate rate control therapy ie amio or dig- may benefit from cardiology consult- will defer to critical care/medical team managing this. From surgery standpoint, OK to resume heparin drip today without bolus.     LOS: 2 days    Clovis Riley , [facility] 12/28/2018, 6:05 AM

## 2018-12-29 LAB — CBC WITH DIFFERENTIAL/PLATELET
Abs Immature Granulocytes: 0.24 10*3/uL — ABNORMAL HIGH (ref 0.00–0.07)
Basophils Absolute: 0 10*3/uL (ref 0.0–0.1)
Basophils Relative: 0 %
Eosinophils Absolute: 0 10*3/uL (ref 0.0–0.5)
Eosinophils Relative: 0 %
HEMATOCRIT: 31.1 % — AB (ref 36.0–46.0)
Hemoglobin: 9.5 g/dL — ABNORMAL LOW (ref 12.0–15.0)
Immature Granulocytes: 5 %
Lymphocytes Relative: 4 %
Lymphs Abs: 0.2 10*3/uL — ABNORMAL LOW (ref 0.7–4.0)
MCH: 29.3 pg (ref 26.0–34.0)
MCHC: 30.5 g/dL (ref 30.0–36.0)
MCV: 96 fL (ref 80.0–100.0)
MONO ABS: 1.6 10*3/uL — AB (ref 0.1–1.0)
MONOS PCT: 29 %
Neutro Abs: 3.3 10*3/uL (ref 1.7–7.7)
Neutrophils Relative %: 62 %
Platelets: 141 10*3/uL — ABNORMAL LOW (ref 150–400)
RBC: 3.24 MIL/uL — ABNORMAL LOW (ref 3.87–5.11)
RDW: 16.3 % — ABNORMAL HIGH (ref 11.5–15.5)
WBC: 5.3 10*3/uL (ref 4.0–10.5)
nRBC: 0.4 % — ABNORMAL HIGH (ref 0.0–0.2)

## 2018-12-29 LAB — GLUCOSE, CAPILLARY
GLUCOSE-CAPILLARY: 95 mg/dL (ref 70–99)
Glucose-Capillary: 107 mg/dL — ABNORMAL HIGH (ref 70–99)
Glucose-Capillary: 104 mg/dL — ABNORMAL HIGH (ref 70–99)
Glucose-Capillary: 104 mg/dL — ABNORMAL HIGH (ref 70–99)
Glucose-Capillary: 92 mg/dL (ref 70–99)

## 2018-12-29 LAB — BASIC METABOLIC PANEL
Anion gap: 10 (ref 5–15)
BUN: 66 mg/dL — ABNORMAL HIGH (ref 8–23)
CO2: 26 mmol/L (ref 22–32)
Calcium: 6.8 mg/dL — ABNORMAL LOW (ref 8.9–10.3)
Chloride: 111 mmol/L (ref 98–111)
Creatinine, Ser: 1.32 mg/dL — ABNORMAL HIGH (ref 0.44–1.00)
GFR calc non Af Amer: 40 mL/min — ABNORMAL LOW (ref >60)
GFR, EST AFRICAN AMERICAN: 47 mL/min — AB (ref >60)
Glucose, Bld: 114 mg/dL — ABNORMAL HIGH (ref 70–99)
Potassium: 3.2 mmol/L — ABNORMAL LOW (ref 3.5–5.1)
SODIUM: 147 mmol/L — AB (ref 135–145)

## 2018-12-29 LAB — DIGOXIN LEVEL: Digoxin Level: 1.3 ng/mL (ref 0.8–2.0)

## 2018-12-29 LAB — MAGNESIUM: Magnesium: 2.8 mg/dL — ABNORMAL HIGH (ref 1.7–2.4)

## 2018-12-29 MED ORDER — KCL-LACTATED RINGERS 20 MEQ/L IV SOLN: INTRAVENOUS | Status: DC

## 2018-12-29 MED ORDER — POTASSIUM CHLORIDE 2 MEQ/ML IV SOLN
INTRAVENOUS | Status: DC
  Administered 2018-12-29 – 2018-12-30 (×2): via INTRAVENOUS
  Filled 2018-12-29 (×3): qty 1000

## 2018-12-29 MED ORDER — DIPHENHYDRAMINE HCL 50 MG/ML IJ SOLN: 12.5000 mg | Freq: Four times a day (QID) | INTRAMUSCULAR | Status: DC | PRN

## 2018-12-29 MED ORDER — ONDANSETRON HCL 4 MG/2ML IJ SOLN
4.0000 mg | Freq: Four times a day (QID) | INTRAMUSCULAR | Status: DC | PRN
  Administered 2019-01-08 – 2019-01-10 (×4): 4 mg via INTRAVENOUS
  Filled 2018-12-29 (×4): qty 2

## 2018-12-29 MED ORDER — NALOXONE HCL 0.4 MG/ML IJ SOLN: 0.4000 mg | INTRAMUSCULAR | Status: DC | PRN

## 2018-12-29 MED ORDER — FENTANYL 40 MCG/ML IV SOLN
INTRAVENOUS | Status: DC
  Administered 2018-12-29: 30 ug via INTRAVENOUS
  Administered 2018-12-29: 11:00:00 via INTRAVENOUS
  Administered 2018-12-30: 40 ug via INTRAVENOUS
  Administered 2018-12-30: 80 ug via INTRAVENOUS
  Administered 2018-12-30: 150 ug via INTRAVENOUS
  Administered 2018-12-30: 50 ug via INTRAVENOUS
  Administered 2018-12-30: 23:00:00 via INTRAVENOUS
  Administered 2018-12-30: 110 ug via INTRAVENOUS
  Administered 2018-12-30 (×2): 150 ug via INTRAVENOUS
  Administered 2018-12-31: 100 ug via INTRAVENOUS
  Administered 2018-12-31: 60 ug via INTRAVENOUS
  Filled 2018-12-29 (×2): qty 25

## 2018-12-29 MED ORDER — METHOCARBAMOL 1000 MG/10ML IJ SOLN
1000.0000 mg | Freq: Four times a day (QID) | INTRAVENOUS | Status: DC | PRN
  Filled 2018-12-29: qty 10

## 2018-12-29 MED ORDER — HYDROMORPHONE HCL 1 MG/ML IJ SOLN
0.5000 mg | INTRAMUSCULAR | Status: DC | PRN
  Administered 2018-12-31 (×2): 0.5 mg via INTRAVENOUS
  Filled 2018-12-29 (×2): qty 1

## 2018-12-29 MED ORDER — DIPHENHYDRAMINE HCL 12.5 MG/5ML PO ELIX: 12.5000 mg | ORAL_SOLUTION | Freq: Four times a day (QID) | ORAL | Status: DC | PRN

## 2018-12-29 MED ORDER — POTASSIUM CHLORIDE 10 MEQ/50ML IV SOLN
10.0000 meq | INTRAVENOUS | Status: AC
  Administered 2018-12-29 (×3): 10 meq via INTRAVENOUS
  Filled 2018-12-29 (×3): qty 50

## 2018-12-29 MED ORDER — ONDANSETRON HCL 4 MG/2ML IJ SOLN: 4.0000 mg | Freq: Four times a day (QID) | INTRAMUSCULAR | Status: DC | PRN

## 2018-12-29 MED ORDER — POTASSIUM CHLORIDE 2 MEQ/ML IV SOLN
INTRAVENOUS | Status: DC
  Filled 2018-12-29: qty 1000

## 2018-12-29 MED ORDER — SODIUM CHLORIDE 0.9% FLUSH: 9.0000 mL | INTRAVENOUS | Status: DC | PRN

## 2018-12-29 MED ORDER — SODIUM CHLORIDE 0.9 % IV SOLN
2.0000 g | Freq: Two times a day (BID) | INTRAVENOUS | Status: DC
  Administered 2018-12-29 (×2): 2 g via INTRAVENOUS
  Filled 2018-12-29 (×3): qty 2

## 2018-12-29 MED ORDER — ACETAMINOPHEN 10 MG/ML IV SOLN
1000.0000 mg | Freq: Three times a day (TID) | INTRAVENOUS | Status: AC
  Administered 2018-12-29 – 2018-12-30 (×3): 1000 mg via INTRAVENOUS
  Filled 2018-12-29 (×3): qty 100

## 2018-12-29 MED ORDER — HEPARIN (PORCINE) 25000 UT/250ML-% IV SOLN
850.0000 [IU]/h | INTRAVENOUS | Status: DC
  Administered 2018-12-29: 1000 [IU]/h via INTRAVENOUS
  Administered 2018-12-30 – 2019-01-01 (×3): 850 [IU]/h via INTRAVENOUS
  Filled 2018-12-29 (×4): qty 250

## 2018-12-29 NOTE — Progress Notes (Signed)
PHARMACY NOTE:  ANTIMICROBIAL RENAL DOSAGE ADJUSTMENT  Current antimicrobial regimen includes a mismatch between antimicrobial dosage and estimated renal function.  As per policy approved by the Pharmacy & Therapeutics and Medical Executive Committees, the antimicrobial dosage will be adjusted accordingly.  Current antimicrobial dosage:  Cefotetan 2g IV q24h   Renal Function:  Estimated Creatinine Clearance: 39.2 mL/min (A) (by C-G formula based on SCr of 1.32 mg/dL (H)). []      On intermittent HD, scheduled: []      On CRRT    Antimicrobial dosage has been changed to: Cefotetan 2g IV q12h    Thank you for allowing pharmacy to be a part of this patient's care.  Luiz Ochoa, [facility] 12/29/2018 9:42 AM    '

## 2018-12-29 NOTE — Progress Notes (Signed)
ANTICOAGULATION CONSULT NOTE - Follow Up Consult  Pharmacy Consult for heparin- No bolus Indication: atrial fibrillation  Allergies  Allergen Reactions  . Metformin And Related Nausea And Vomiting  . Prednisone Nausea And Vomiting    Patient Measurements: Height: 5\' 3"  (160 cm) Weight: 190 lb 14.7 oz (86.6 kg) IBW/kg (Calculated) : 52.4 Heparin Dosing Weight: 69.7 kg  Vital Signs: Temp: 97.8 F (36.6 C) (02/16 1200) Temp Source: Oral (02/16 1200) BP: 150/47 (02/16 0600) Pulse Rate: 104 (02/16 0600)  Labs: Recent Labs    12/27/18 0325 12/27/18 0429 12/27/18 1436 12/28/18 0323 12/29/18 0400  HGB 9.4*  --  7.6* 10.3* 9.5*  HCT 31.2*  --  25.4* 33.7* 31.1*  PLT 284  --  261 250 141*  APTT 34  --   --   --   --   LABPROT 23.7*  --   --  22.0*  --   INR 2.14  --   --  1.95  --   CREATININE 3.08*  --   --  2.65* 1.32*  TROPONINI  --  <0.03  --   --   --     Estimated Creatinine Clearance: 40.2 mL/min (A) (by C-G formula based on SCr of 1.32 mg/dL (H)).   Medications:  Medications Prior to Admission  Medication Sig Dispense Refill Last Dose  . apixaban (ELIQUIS) 5 MG TABS tablet TAKE 1 TABLET (5 MG TOTAL) BY MOUTH 2 (TWO) TIMES DAILY. (Patient taking differently: Take 5 mg by mouth 2 (two) times daily. ) 180 tablet 2 12/25/2018 at 800pm  . diltiazem (CARDIZEM CD) 120 MG 24 hr capsule Take 1 capsule (120 mg total) by mouth 2 (two) times daily. 180 capsule 3 12/25/2018 at Unknown time  . furosemide (LASIX) 20 MG tablet Lasix 20 mg po daily PRN for weight of greater than 3 lbs in a day or 5 lbs in a week 30 tablet 11 Past Week at Unknown time  . HYDROcodone-acetaminophen (NORCO) 7.5-325 MG tablet Take 1 tablet by mouth 2 (two) times daily as needed (pain).    12/25/2018 at Unknown time  . insulin detemir (LEVEMIR) 100 UNIT/ML injection Inject 0.07 mLs (7 Units total) into the skin daily before breakfast. 10 mL 11 12/25/2018 at Unknown time  . liraglutide (VICTOZA) 18 MG/3ML SOPN  Inject 1.8 mg into the skin daily before breakfast.    12/25/2018 at Unknown time  . Multiple Vitamin (MULTIVITAMIN WITH MINERALS) TABS tablet Take 2 tablets by mouth daily at 12 noon.    Past Week at Unknown time  . oxybutynin (DITROPAN) 5 MG tablet Take 20 mg by mouth daily.   Past Week at Unknown time  . oxybutynin (DITROPAN-XL) 10 MG 24 hr tablet Take 10 mg by mouth daily.     . sertraline (ZOLOFT) 100 MG tablet Take 100 mg by mouth daily.    Past Week at Unknown time  . simvastatin (ZOCOR) 20 MG tablet Take 20 mg by mouth at bedtime.    12/25/2018 at Unknown time    Assessment: 73 yo F on apixaban 5 mg po BID PTA for afib - last dose 2/13 at 2000.  S/p OR 2/14 SBR.  Pharmacy consulted to dose heparin for afib with no bolus today.  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  Hg 9.5, PLTC 141.  S/p 2 U PRBC 2/14 and 2 U FFP 2/14.   Goal of Therapy:  Heparin level 0.3-0.7 units/ml Monitor platelets  by anticoagulation protocol: Yes   Plan:  No bolus per MD order, start Heparin drip at 1000 units/hr and check 8 hr HL Daily heparin level and CBC while on heparin drip  Eudelia Bunch, Pharm.D [phone] 12/29/2018 1:54 PM

## 2018-12-29 NOTE — Progress Notes (Addendum)
[facility] Progress Note  2 Days Post-Op  Subjective: CC: pain, wants ice  She complains of pain and is more confused this morning  Objective: Vital signs in last 24 hours: Temp:  [97.3 F (36.3 C)-99.1 F (37.3 C)] 99.1 F (37.3 C) (02/16 0330) Pulse Rate:  [88-123] 104 (02/16 0600) Resp:  [10-32] 14 (02/16 0600) BP: (88-159)/(32-88) 150/47 (02/16 0600) SpO2:  [87 %-100 %] 93 % (02/16 0600) Weight:  [82.7 kg] 82.7 kg (02/16 0325) Last BM Date: 12/24/18  Intake/Output from previous day: 02/15 0701 - 02/16 0700 In: 4599.9 [P.O.:160; I.V.:3861.4; NG/GT:30; IV Piggyback:228.6] Out: 5630 [Urine:3775; Emesis/NG output:1850; Stool:5] Intake/Output this shift: No intake/output data recorded.  PE: Gen:  Confused, repeating the words "pain" and "ice"; notes pain in back and abdomen Card:  Irregularly irregular, 90s Pulm:  Unlabored respirations, sats mid-90s on nasal canula Abd: Soft, somewhat generalized TTP, distended- same as yesterday. Midline laparotomy is c/d/i without cellulitis. RLQ colostomy is dark and partially retracted, no output Skin: warm and dry, no rashes  NG tube suction cannister is both uncapped and the suction turned down to 0- evacuated about 31mL bilious stomach contents when corrected   Lab Results:  Recent Labs    12/28/18 0323 12/29/18 0400  WBC 7.1 5.3  HGB 10.3* 9.5*  HCT 33.7* 31.1*  PLT 250 141*   BMET Recent Labs    12/28/18 0323 12/29/18 0400  NA 142 147*  K 3.3* 3.2*  CL 108 111  CO2 20* 26  GLUCOSE 156* 114*  BUN 114* 66*  CREATININE 2.65* 1.32*  CALCIUM 5.9* 6.8*   PT/INR Recent Labs    12/27/18 0325 12/28/18 0323  LABPROT 23.7* 22.0*  INR 2.14 1.95   CMP     Component Value Date/Time   NA 147 (H) 12/29/2018 0400   K 3.2 (L) 12/29/2018 0400   CL 111 12/29/2018 0400   CO2 26 12/29/2018 0400   GLUCOSE 114 (H) 12/29/2018 0400   BUN 66 (H) 12/29/2018 0400   CREATININE 1.32 (H) 12/29/2018 0400   CREATININE 1.04 (H) 12/18/2018 1029   CALCIUM 6.8 (L) 12/29/2018 0400   PROT 4.5 (L) 12/28/2018 0323   ALBUMIN 1.9 (L) 12/28/2018 0323   AST 38 12/28/2018 0323   AST 60 (H) 12/18/2018 1029   ALT 25 12/28/2018 0323   ALT 117 (H) 12/18/2018 1029   ALKPHOS 62 12/28/2018 0323   BILITOT 0.4 12/28/2018 0323   BILITOT 0.5 12/18/2018 1029   GFRNONAA 40 (L) 12/29/2018 0400   GFRNONAA 54 (L) 12/18/2018 1029   GFRAA 47 (L) 12/29/2018 0400   GFRAA >60 12/18/2018 1029   Lipase     Component Value Date/Time   LIPASE 24 12/25/2018 2254       Studies/Results: No results found.  Anti-infectives: Anti-infectives (From admission, onward)   Start     Dose/Rate Route Frequency Ordered Stop   12/29/18 1000  cefoTEtan (CEFOTAN) 2 g in sodium chloride 0.9 % 100 mL IVPB     2 g 200 mL/hr over 30 Minutes Intravenous Every 24 hours 12/28/18 1654     12/27/18 2200  cefoTEtan (CEFOTAN) 2 g in sodium chloride 0.9 % 100 mL IVPB  Status:  Discontinued     2 g 200 mL/hr over 30 Minutes Intravenous Every 12 hours 12/27/18 1449 12/28/18 1654   12/27/18 1039  sodium chloride 0.9 % with cefoTEtan (CEFOTAN) ADS Med    Note to Pharmacy:  Chrystine Oiler: cabinet override  12/27/18 1039 12/27/18 1130   12/26/18 1230  cefoTEtan (CEFOTAN) 2 g in sodium chloride 0.9 % 100 mL IVPB     2 g 200 mL/hr over 30 Minutes Intravenous On call to O.R. 12/26/18 1224 12/27/18 0559       Assessment/Plan Recent admission for chest pain Atrial fibrillation on Eliquis -last dose 12/25/2018- rate controlled and BP better today now on digoxin Hypertension Acute on chronic kidney disease stage III- creatinine/BUN downtrending, UOP adequate Type 2 diabetes- SSI Hx depression Anemia- appropriate response to transfusion, hgb 9.5 from 10.3 today Platelets dropped from 250 to 140 Hypokalemia 3.2- replace IV, gently given AKI and on digoxin now Malnutrition- prealbumin 10 preop  Obstructing cancer of the left and  sigmoid colon with near total obstruction - moderately differentiated adenocarcinomaof left colon with obstructing mass near the splenic flexure as well as a second colon cancer in the sigmoid colon - currently on chemotherapy - last dose 12/18/18? -s/p Laparotomy with mobilization of the splenic flexure and resection of the  sigmoid, descending and distal transverse colon with [place] pouch and end colostomy in the right upper quadrant, 12/27/18 Dr. Hassell Done Liver metastasis   FEN: N.p.o./IV fluids ID:  cefotetan  DVT: SQH. SCDs. Previously on Eliquis - last dose 12/25/18  Plan:  -Continue NG decompression, NPO, IVF, await return of bowel function. Expected post-op ileus. . -Multimodal pain control- iv tylenol for another 24h, robaxin, dilaudid -Mobilize as much as possible. PT consulted. -Start TPN (order placed).   -Anticoagulation to be directed/ initiated per primary team. OK from surgery standpoint to start heparin drip without bolus. Would note that her platelets have dropped quite a bit today which may be dilutional given over-all drop in labs, vs consumptive vs chemo related given timing vs HIT- will check pf4    LOS: 3 days    Cassandra Allen , [facility] 12/29/2018, 7:36 AM

## 2018-12-29 NOTE — Progress Notes (Signed)
Brief Pharmacy Note:  Per Dr. Lynetta Mare, delay start of TPN until tomorrow, 12/30/2018. Have ordered TPN labs for AM and dietician consult for guidance on nutritional goals.    Lindell Spar, PharmD, BCPS Pager: [phone] 12/29/2018 9:49 AM

## 2018-12-29 NOTE — Consult Note (Signed)
NAME:  Cassandra Allen, MRN:  [id number], DOB:  05-06-1946, LOS: 3 ADMISSION DATE:  12/25/2018, CONSULTATION DATE:  12/28/2018 REFERRING MD:  Verlon Au - [facility], CHIEF COMPLAINT:  Hypotension.   HPI/course in hospital  73 year old woman who underwent hemicolectomy for obstructing sigmoid cancer.   We were asked to see for persistent post-operative hypotension and uncontrolled Afib now POD1.  Intraoperative findings of ischemia of sigmoid colon, associated fetid smell, but no frank peritoneal soiling.   Admitted to ICU on diltiazem IV and phenylephrine 2/14.  Past Medical History   Past Medical History:  Diagnosis Date  . Acute diastolic heart failure ([place]) 12/22/2018  . Arthritis   . Atrial fibrillation, chronic on Eliquis and diltiazem 12/22/2018  . Cancer of left colon ([place]) 10/30/2018  . Cancer of sigmoid colon  12/27/2018  . Diabetes mellitus without complication ([place])   . Hypertension   . Obesity (BMI 30-39.9) 12/27/2018     Past Surgical History:  Procedure Laterality Date  . COLONOSCOPY  10/2018   Dr [name].  Large cancer at splenic flexure,  Bulky sigmoid colon mass, Numerous polyps  . IR IMAGING GUIDED PORT INSERTION  11/20/2018     Interim history/subjective:  Feels generally unwell. Generalized abdominal pain. She reports minimal oral intake for 5 days prior to surgery. Denies dyspnea.  Objective   Blood pressure (!) 150/47, pulse (!) 104, temperature 98.4 F (36.9 C), temperature source Oral, resp. rate 14, height 5\' 3"  (1.6 m), weight 82.7 kg, SpO2 93 %.        Intake/Output Summary (Last 24 hours) at 12/29/2018 0835 Last data filed at 12/29/2018 0650 Gross per 24 hour  Intake 4284.39 ml  Output 5630 ml  Net -1345.61 ml   Filed Weights   12/27/18 0500 12/28/18 0500 12/29/18 0325  Weight: 79.2 kg 82.3 kg 82.7 kg    Examination: Physical Exam  Constitutional: She is oriented to person, place, and time. She has a sickly appearance.  Appears in pain  HENT:    Dry mucus membranes.  Neck: No JVD (Flat JVP) present.  Cardiovascular: Normal heart sounds. An irregularly irregular rhythm present. Tachycardia present.  Respiratory: Breath sounds normal. No respiratory distress.  GI: She exhibits distension. Bowel sounds are decreased. There is generalized abdominal tenderness.  Minimal ostomy output.  Musculoskeletal:        General: Edema present.  Neurological: She is alert and oriented to person, place, and time. GCS eye subscore is 4. GCS verbal subscore is 5. GCS motor subscore is 6.  Mild tremor  Skin: Skin is warm and dry.  Clean incision.     Ancillary tests (personally reviewed)  CBC: Recent Labs  Lab 12/25/18 2254 12/26/18 0702 12/27/18 0325 12/27/18 1436 12/28/18 0323 12/29/18 0400  WBC 11.8* 13.2* 6.9 2.8* 7.1 5.3  NEUTROABS 10.3*  --   --   --  4.3 3.3  HGB 10.4* 10.1* 9.4* 7.6* 10.3* 9.5*  HCT 33.2* 32.6* 31.2* 25.4* 33.7* 31.1*  MCV 95.7 96.7 96.3 98.8 96.0 96.0  PLT 309 308 284 261 250 141*    Basic Metabolic Panel: Recent Labs  Lab 12/22/18 1322  12/25/18 2254 12/26/18 0702 12/27/18 0325 12/27/18 1625 12/28/18 0323 12/28/18 0400 12/29/18 0400  NA 141   < > 134* 137 139  --  142  --  147*  K 4.5   < > 4.3 4.1 3.9  --  3.3*  --  3.2*  CL 107   < > 98 103  100  --  108  --  111  CO2 25   < > 21* 22 21*  --  20*  --  26  GLUCOSE 114*   < > 271* 181* 104* 134* 156*  --  114*  BUN 35*   < > 112* 119* 129*  --  114*  --  66*  CREATININE 1.19*   < > 2.32* 2.26* 3.08*  --  2.65*  --  1.32*  CALCIUM 8.8*   < > 7.1* 6.6* 6.2*  --  5.9*  --  6.8*  MG 2.0  --   --   --   --   --  3.6*  --  2.8*  PHOS 3.6  --   --   --   --   --   --  7.4*  --    < > = values in this interval not displayed.   GFR: Estimated Creatinine Clearance: 39.2 mL/min (A) (by C-G formula based on SCr of 1.32 mg/dL (H)). Recent Labs  Lab 12/27/18 0325 12/27/18 1436 12/28/18 0323 12/29/18 0400  WBC 6.9 2.8* 7.1 5.3    Liver Function  Tests: Recent Labs  Lab 12/22/18 1322 12/25/18 2254 12/28/18 0323  AST  --  24 38  ALT  --  21 25  ALKPHOS  --  73 62  BILITOT  --  0.6 0.4  PROT  --  5.3* 4.5*  ALBUMIN 3.1* 2.7* 1.9*   Recent Labs  Lab 12/25/18 2254  LIPASE 24   No results for input(s): AMMONIA in the last 168 hours.  ABG No results found for: PHART, PCO2ART, PO2ART, HCO3, TCO2, ACIDBASEDEF, O2SAT   Coagulation Profile: Recent Labs  Lab 12/27/18 0325 12/28/18 0323  INR 2.14 1.95    Cardiac Enzymes: Recent Labs  Lab 12/22/18 1322 12/22/18 1949 12/27/18 0429  TROPONINI <0.03 <0.03 <0.03    HbA1C: Hgb A1c MFr Bld  Date/Time Value Ref Range Status  08/09/2018 10:03 AM 5.6 4.8 - 5.6 % Final    Comment:    (NOTE) Pre diabetes:          5.7%-6.4% Diabetes:              >6.4% Glycemic control for   <7.0% adults with diabetes   03/26/2015 01:00 AM 7.0 (H) 4.8 - 5.6 % Final    Comment:    (NOTE)         Pre-diabetes: 5.7 - 6.4         Diabetes: >6.4         Glycemic control for adults with diabetes: <7.0     CBG: Recent Labs  Lab 12/28/18 1529 12/28/18 1940 12/28/18 2323 12/29/18 0404 12/29/18 0738  GLUCAP 93 89 90 107* 104*    Assessment & Plan:  Was critically ill due to hypotension requiring titration of phenylephrine to keep MAP>65, now off phenyephrine  Now hypertensive, suggesting that vascular tone has improved and patient is beginning to mobilize third spaced fluids.  Afib with poor rate rate control now adequately rate controlled on digoxin. Status hemicolectomy with ongoing NG output and evidence of possible bowel ischemia at time of operation - suggests that she is likely to have delayed bowel function Acute kidney injury - improving Persistent hypokalemia from NG loss. Ongoing poorly controled perioperative pain.  PLAN  Continue NG suctioning Optimize pain control and start to mobilize to encourage return of bowel function.  Trial of PCA. Continue digoxin for  rate control. Assist diuresis  if inadequate spontaneous diuresis or respiratory distress Replace potassium and add to maintenance fluids, but reduce rate of maintenance fluids Initiate early TPN as patient is at high nutritional risk.  Best practice:  Diet: TPN Pain/Anxiety/Delirium protocol (if indicated): fentanyl prn VAP protocol (if indicated): I/S DVT prophylaxis: UFH tid GI prophylaxis: N/A  Glucose control: Phase 1 protocol Mobility: Up to chair with assistance. Code Status: Full Family Communication: None present Disposition: ICU level of care.   Critical care time: N/A   Kipp Brood, MD [facility] ICU Physician [place]  Pager: [phone] Mobile: [phone] After hours: [phone].  12/29/2018, 8:35 AM

## 2018-12-29 NOTE — Progress Notes (Signed)
[facility] PROGRESS NOTE  Cassandra Allen NAT:[id number] DOB: 1945/12/20 DOA: 12/25/2018 PCP: [name], MD   Narrative:  72 year old Caucasian female Newly diagnosed colon cancer 10/25/2018 after colonoscopy with metastases started on FOLFOX every 2 weekly 12/04/2018 Iron deficiency anemia on iron sucrose Diabetes mellitus type 2 HTN CKD Atrial fibrillation diagnosed 04/2018 [name] score about 3 on anticoagulation  Presented to emergency room with mention to complaints and probable new obstructing mass in addition to AKI Baseline creatinine went from 1.3-2.39  General surgery consulted  Patient developed A. fib with RVR overnight  2/13 and was transferred to stepdown unit   A & Plan A. fib with RVR chads score >3 On IV digoxin--loaded 2/15 Start Hep GTT until gut reliably working Get digoxin level in am--narrow therapeutic index drug Peri-op Hypotension and anemia  D/c cardizem 2/15  Pressures improved Cont LR with 20 K @ 75 cc/h Replacing Calcium as well Check am Mag given afib Obstructive colonic mass in the setting of newly diagnosed colon cancer 10/25/2018 Await Pathology report--will need Oncology follow-up close to d/c TPN started per CCM on 2/15 further management deferred to Gen surgery-currently on Fentanyl PCA and dilaudid continue Cefotetan as per Gen surgery Iron deficiency with acute blood loss anemia superimposed upon the same hemoglobin at baseline is in the 10 range-his hematocrit has dropped postsurgery to 7.6 transfused-see below Follow HIt Ab--feel more component acute blood loss anemia Diabetes mellitus type 2 Sugars 95-107 HTN with peri-op hypotension On digoxin now for HR--see above Acute superimposed on CKD stage II Baseline creatinine 1.3 on admission about 2.2-cont saline Significantly improved Cont IVF  Morbid obesity BMI 30    DVT heparin at this time code Status: Full communication: called son disposition Plan:  Inpatient   Justyce Baby, MD  [facility] Via [employer] app OR [email or web address] 7PM-7AM contact night coverage as above 12/29/2018, 1:06 PM  LOS: 3 days   Consultants:  General surgery  Cardiology  Procedures:  Status post resection laparotomy Hartmann pouch placement 12/27/2018  Antimicrobials:  Peri-operative at this time  Interval history/Subjective:  alert coherent no fever chills No other issues Pain reasonable  Objective:  Vitals:  Vitals:   12/29/18 1048 12/29/18 1200  BP:    Pulse:    Resp: 16   Temp:  97.8 F (36.6 C)  SpO2: 95%     Exam:  Coherent, foll commands, NGT still in place seems to be in pain EOMI NCAT Abdomen obese distended pouch noted postop changes noted No lower extremity edema S1-S2 tachycardic afib noted Neurologically intact moving all 4 limbs but this is somewhat incoherent because of pain/anesthesia   I have personally reviewed the following:  DATA   Labs:  BUN/creatinine 2.3-->129/3.0-->114/2.65-->66/1.32  Hemoglobin down from 10.1-7.6--->2 u prbc 2/14-->10.3-->9.5  PLT 250-->141  Currently +2.9 liters, NG out 1.8 liters  Imaging studies:  none   Scheduled Meds: . chlorhexidine  15 mL Mouth Rinse BID  . Chlorhexidine Gluconate Cloth  6 each Topical Daily  . digoxin  0.125 mg Intravenous Q48H  . fentaNYL   Intravenous Q4H  . heparin injection (subcutaneous)  5,000 Units Subcutaneous Q8H  . insulin aspart  0-15 Units Subcutaneous Q4H  . lip balm  1 application Topical BID  . mouth rinse  15 mL Mouth Rinse q12n4p  . sodium chloride flush  10-40 mL Intracatheter Q12H   Continuous Infusions: . sodium chloride Stopped (12/27/18 2232)  . acetaminophen 1,000 mg (12/29/18 1146)  . cefoTEtan (CEFOTAN) IV Stopped (12/29/18 1108)  .  lactated ringers with kcl 75 mL/hr at 12/29/18 1144  . methocarbamol (ROBAXIN) IV      Principal Problem:   Colonic obstruction due to metastatic colon cancer Active Problems:    Essential (primary) hypertension   Diabetes mellitus without complication (HCC)   AKI (acute kidney injury) ([place])   Iron deficiency anemia   Cancer of splenic flexure of colon   Atrial fibrillation, chronic   Anticoagulant long-term use   Cancer of sigmoid colon    History of adenomatous polyp of colon   Acute diastolic heart failure (HCC)   Liver metastases from liver cancer   CKD (chronic kidney disease) stage 3, GFR 30-59 ml/min (HCC)   Obesity (BMI 30-39.9)   Choledocholithiasis by MRCP   Status post partial colectomy Feb 2020   LOS: 3 days

## 2018-12-30 ENCOUNTER — Encounter (HOSPITAL_COMMUNITY): Payer: Self-pay | Admitting: Surgery

## 2018-12-30 LAB — BPAM RBC
Blood Product Expiration Date: 202003092359
Blood Product Expiration Date: 202003092359
ISSUE DATE / TIME: 202002141815
ISSUE DATE / TIME: 202002142025
Unit Type and Rh: 6200
Unit Type and Rh: 6200

## 2018-12-30 LAB — CBC WITH DIFFERENTIAL/PLATELET
Abs Immature Granulocytes: 0.35 10*3/uL — ABNORMAL HIGH (ref 0.00–0.07)
Basophils Absolute: 0 10*3/uL (ref 0.0–0.1)
Basophils Relative: 0 %
Eosinophils Absolute: 0 10*3/uL (ref 0.0–0.5)
Eosinophils Relative: 0 %
HEMATOCRIT: 30 % — AB (ref 36.0–46.0)
Hemoglobin: 9.3 g/dL — ABNORMAL LOW (ref 12.0–15.0)
Immature Granulocytes: 5 %
LYMPHS ABS: 0.3 10*3/uL — AB (ref 0.7–4.0)
Lymphocytes Relative: 5 %
MCH: 30 pg (ref 26.0–34.0)
MCHC: 31 g/dL (ref 30.0–36.0)
MCV: 96.8 fL (ref 80.0–100.0)
Monocytes Absolute: 1.4 10*3/uL — ABNORMAL HIGH (ref 0.1–1.0)
Monocytes Relative: 21 %
Neutro Abs: 4.6 10*3/uL (ref 1.7–7.7)
Neutrophils Relative %: 69 %
Platelets: 128 10*3/uL — ABNORMAL LOW (ref 150–400)
RBC: 3.1 MIL/uL — ABNORMAL LOW (ref 3.87–5.11)
RDW: 16.4 % — ABNORMAL HIGH (ref 11.5–15.5)
WBC: 6.8 10*3/uL (ref 4.0–10.5)
nRBC: 0.4 % — ABNORMAL HIGH (ref 0.0–0.2)

## 2018-12-30 LAB — PREPARE FRESH FROZEN PLASMA: Unit division: 0

## 2018-12-30 LAB — HEPARIN LEVEL (UNFRACTIONATED)
Heparin Unfractionated: 0.86 IU/mL — ABNORMAL HIGH (ref 0.30–0.70)
Heparin Unfractionated: 0.47 IU/mL (ref 0.30–0.70)
Heparin Unfractionated: 0.46 IU/mL (ref 0.30–0.70)

## 2018-12-30 LAB — TYPE AND SCREEN
ABO/RH(D): A POS
Antibody Screen: NEGATIVE
Unit division: 0
Unit division: 0

## 2018-12-30 LAB — BPAM FFP
BLOOD PRODUCT EXPIRATION DATE: 202002192359
Blood Product Expiration Date: 202002192359
ISSUE DATE / TIME: 202002141115
ISSUE DATE / TIME: 202002141202
Unit Type and Rh: 6200
Unit Type and Rh: 6200

## 2018-12-30 LAB — PREALBUMIN: Prealbumin: <5 mg/dL — ABNORMAL LOW (ref 18–38)

## 2018-12-30 LAB — COMPREHENSIVE METABOLIC PANEL
ALT: 23 U/L (ref 0–44)
AST: 21 U/L (ref 15–41)
Albumin: 1.5 g/dL — ABNORMAL LOW (ref 3.5–5.0)
Alkaline Phosphatase: 87 U/L (ref 38–126)
Anion gap: 12 (ref 5–15)
BILIRUBIN TOTAL: 0.9 mg/dL (ref 0.3–1.2)
BUN: 46 mg/dL — ABNORMAL HIGH (ref 8–23)
CO2: 31 mmol/L (ref 22–32)
Calcium: 7.4 mg/dL — ABNORMAL LOW (ref 8.9–10.3)
Chloride: 109 mmol/L (ref 98–111)
Creatinine, Ser: 1.1 mg/dL — ABNORMAL HIGH (ref 0.44–1.00)
GFR calc Af Amer: 58 mL/min — ABNORMAL LOW (ref >60)
GFR calc non Af Amer: 50 mL/min — ABNORMAL LOW (ref >60)
Glucose, Bld: 104 mg/dL — ABNORMAL HIGH (ref 70–99)
Potassium: 3.2 mmol/L — ABNORMAL LOW (ref 3.5–5.1)
Sodium: 152 mmol/L — ABNORMAL HIGH (ref 135–145)
TOTAL PROTEIN: 4.4 g/dL — AB (ref 6.5–8.1)

## 2018-12-30 LAB — HEPARIN INDUCED PLATELET AB (HIT ANTIBODY): Heparin Induced Plt Ab: 0.434 OD — ABNORMAL HIGH (ref 0.000–0.400)

## 2018-12-30 LAB — TRIGLYCERIDES: Triglycerides: 182 mg/dL — ABNORMAL HIGH (ref <150)

## 2018-12-30 LAB — GLUCOSE, CAPILLARY
GLUCOSE-CAPILLARY: 95 mg/dL (ref 70–99)
GLUCOSE-CAPILLARY: 90 mg/dL (ref 70–99)
Glucose-Capillary: 117 mg/dL — ABNORMAL HIGH (ref 70–99)
Glucose-Capillary: 127 mg/dL — ABNORMAL HIGH (ref 70–99)
Glucose-Capillary: 141 mg/dL — ABNORMAL HIGH (ref 70–99)
Glucose-Capillary: 172 mg/dL — ABNORMAL HIGH (ref 70–99)
Glucose-Capillary: 186 mg/dL — ABNORMAL HIGH (ref 70–99)
Glucose-Capillary: 92 mg/dL (ref 70–99)
Glucose-Capillary: 93 mg/dL (ref 70–99)

## 2018-12-30 LAB — APTT
aPTT: 111 seconds — ABNORMAL HIGH (ref 24–36)
aPTT: 97 seconds — ABNORMAL HIGH (ref 24–36)

## 2018-12-30 LAB — MAGNESIUM: Magnesium: 2.7 mg/dL — ABNORMAL HIGH (ref 1.7–2.4)

## 2018-12-30 LAB — PHOSPHORUS: Phosphorus: 3.3 mg/dL (ref 2.5–4.6)

## 2018-12-30 MED ORDER — DIGOXIN 0.25 MG/ML IJ SOLN
0.1250 mg | Freq: Every day | INTRAMUSCULAR | Status: DC
  Administered 2018-12-30 – 2019-01-01 (×3): 0.125 mg via INTRAVENOUS
  Filled 2018-12-30 (×3): qty 2

## 2018-12-30 MED ORDER — METOPROLOL TARTRATE 5 MG/5ML IV SOLN
2.5000 mg | INTRAVENOUS | Status: DC | PRN
  Administered 2018-12-30 – 2018-12-31 (×2): 2.5 mg via INTRAVENOUS
  Filled 2018-12-30 (×2): qty 5

## 2018-12-30 MED ORDER — PANTOPRAZOLE SODIUM 40 MG IV SOLR
40.0000 mg | INTRAVENOUS | Status: DC
  Administered 2018-12-30 – 2018-12-31 (×2): 40 mg via INTRAVENOUS
  Filled 2018-12-30 (×2): qty 40

## 2018-12-30 MED ORDER — TRAVASOL 10 % IV SOLN
INTRAVENOUS | Status: AC
  Administered 2018-12-30: 19:00:00 via INTRAVENOUS
  Filled 2018-12-30: qty 600

## 2018-12-30 MED ORDER — DEXTROSE 5 % IV SOLN: INTRAVENOUS | Status: DC

## 2018-12-30 MED ORDER — POTASSIUM CHLORIDE 2 MEQ/ML IV SOLN
INTRAVENOUS | Status: DC
  Filled 2018-12-30: qty 1000

## 2018-12-30 MED ORDER — POTASSIUM CHLORIDE 2 MEQ/ML IV SOLN
INTRAVENOUS | Status: DC
  Administered 2018-12-30: 11:00:00 via INTRAVENOUS
  Filled 2018-12-30: qty 1000

## 2018-12-30 MED ORDER — DIGOXIN 0.25 MG/ML IJ SOLN: 0.1250 mg | Freq: Every day | INTRAMUSCULAR | Status: DC

## 2018-12-30 NOTE — Progress Notes (Signed)
ANTICOAGULATION CONSULT NOTE - Follow Up Consult  Pharmacy Consult for heparin Indication: atrial fibrillation  Allergies  Allergen Reactions  . Metformin And Related Nausea And Vomiting  . Prednisone Nausea And Vomiting    Patient Measurements: Height: 5\' 3"  (160 cm) Weight: 180 lb 12.4 oz (82 kg) IBW/kg (Calculated) : 52.4 Heparin Dosing Weight: 69.7 kg  Vital Signs: Temp: 98.7 F (37.1 C) (02/17 1155) Temp Source: Axillary (02/17 1155) BP: 166/70 (02/17 1300) Pulse Rate: 110 (02/17 1300)  Labs: Recent Labs    12/28/18 0323 12/29/18 0400 12/29/18 2221 12/29/18 2230 12/30/18 0319 12/30/18 1100  HGB 10.3* 9.5*  --   --  9.3*  --   HCT 33.7* 31.1*  --   --  30.0*  --   PLT 250 141*  --   --  128*  --   APTT  --   --  111*  --   --  97*  LABPROT 22.0*  --   --   --   --   --   INR 1.95  --   --   --   --   --   HEPARINUNFRC  --   --   --  0.86*  --  0.47  CREATININE 2.65* 1.32*  --   --  1.10*  --     Estimated Creatinine Clearance: 46.9 mL/min (A) (by C-G formula based on SCr of 1.1 mg/dL (H)).   Medications:  Medications Prior to Admission  Medication Sig Dispense Refill Last Dose  . apixaban (ELIQUIS) 5 MG TABS tablet TAKE 1 TABLET (5 MG TOTAL) BY MOUTH 2 (TWO) TIMES DAILY. (Patient taking differently: Take 5 mg by mouth 2 (two) times daily. ) 180 tablet 2 12/25/2018 at 800pm  . diltiazem (CARDIZEM CD) 120 MG 24 hr capsule Take 1 capsule (120 mg total) by mouth 2 (two) times daily. 180 capsule 3 12/25/2018 at Unknown time  . furosemide (LASIX) 20 MG tablet Lasix 20 mg po daily PRN for weight of greater than 3 lbs in a day or 5 lbs in a week 30 tablet 11 Past Week at Unknown time  . HYDROcodone-acetaminophen (NORCO) 7.5-325 MG tablet Take 1 tablet by mouth 2 (two) times daily as needed (pain).    12/25/2018 at Unknown time  . insulin detemir (LEVEMIR) 100 UNIT/ML injection Inject 0.07 mLs (7 Units total) into the skin daily before breakfast. 10 mL 11 12/25/2018 at  Unknown time  . liraglutide (VICTOZA) 18 MG/3ML SOPN Inject 1.8 mg into the skin daily before breakfast.    12/25/2018 at Unknown time  . Multiple Vitamin (MULTIVITAMIN WITH MINERALS) TABS tablet Take 2 tablets by mouth daily at 12 noon.    Past Week at Unknown time  . oxybutynin (DITROPAN) 5 MG tablet Take 20 mg by mouth daily.   Past Week at Unknown time  . oxybutynin (DITROPAN-XL) 10 MG 24 hr tablet Take 10 mg by mouth daily.     . sertraline (ZOLOFT) 100 MG tablet Take 100 mg by mouth daily.    Past Week at Unknown time  . simvastatin (ZOCOR) 20 MG tablet Take 20 mg by mouth at bedtime.    12/25/2018 at Unknown time    Assessment: 73 yo F on apixaban 5 mg po BID PTA for afib - last dose 2/13 at 2000.  S/p OR 2/14 SBR.  Pharmacy consulted to dose heparin for afib with no bolus today.  MD aware of possible HIT but feels "more component acute blood  loss anemia".  Hg 9.5, PLTC 141.  S/p 2 U PRBC 2/14 and 2 U FFP 2/14.   Today, 12/30/18  Heparin level therapeutic on current IV heparin rate of 850 units/hr  APTT also therapeutic; therefore, HL and aPTT are correlating so we can now check heparin levels only for further IV heparin monitoring  No reported bleeding  Hgb stable, Plts 128 dropped from 141  HIT Ab still pending  Goal of Therapy:  Heparin level 0.3-0.7 units/ml Monitor platelets by anticoagulation protocol: Yes   Plan:  1) Continue IV heparin at current rate of 850 units/hr 2) Will recheck heparin level in 8 hours to confirm continued goal HL on current IV heparin rate 3) Daily HL and CBC   Adrian Saran, PharmD, BCPS Pager [phone] 12/30/2018 2:02 PM

## 2018-12-30 NOTE — Progress Notes (Signed)
[facility] PROGRESS NOTE  Cassandra Allen PPJ:[id number] DOB: 11-16-1945 DOA: 12/25/2018 PCP: [name], MD   Narrative:  73 year old Caucasian female Newly diagnosed colon cancer 10/25/2018 after colonoscopy with metastases started on FOLFOX every 2 weekly 12/04/2018 Iron deficiency anemia on iron sucrose Diabetes mellitus type 2 HTN CKD Atrial fibrillation diagnosed 04/2018 [name] score about 3 on anticoagulation  Presented to emergency room with mention to complaints and probable new obstructing mass in addition to AKI Baseline creatinine went from 1.3-2.39  General surgery consulted  Patient developed A. fib with RVR overnight  2/13 and was transferred to stepdown unit   A & Plan A. fib with RVR chads score >3 On IV digoxin--loaded 2/15 Prn Iv metoprolol added 2/17 2.5 q 4 prn Start Hep GTT until gut reliably working Might increase digoxin if trends persistently high Not ready to transition to PO meds yet Peri-op Hypotension and anemia  D/c cardizem 2/15 Pressures improved with fluid repletion--off pressors since 24 h Replacing Calcium as well Hypernatremia and Hypokalemia  Changed LR to D5 with K 40 Meq infusions Magnesium levels are conversely elevated Obstructive colonic mass in the setting of newly diagnosed colon cancer 10/25/2018 Await Pathology report--will need Oncology follow-up close to d/c TPN started per CCM on 2/15 further management deferred to Gen surgery-currently on Fentanyl PCA and dilaudid continue Cefotetan as per Gen surgery Iron deficiency with acute blood loss anemia superimposed upon the same Hemoglobin dropped postsurgery to 7.6 transfused-see below Follow HIt Ab--PLT reasonably stable at this time Diabetes mellitus type 2 Sugars 95-107 HTN with peri-op hypotension On digoxin now for HR--see above Acute superimposed on CKD stage II Baseline creatinine 1.3 on admission about 2.2 Cont IVF lower rate in next 24 hours Morbid  obesity BMI 30    DVT heparin at this time code Status:Full Communication: called son 2/16.  Disposition Plan: Inpatient, SDU at this time monitoring HR and Pain needs--foresee will be in SDU over next 3-4 days   Verlon Au, MD  [facility] Via [place] [email or web address] 7PM-7AM contact night coverage as above 12/30/2018, 11:18 AM  LOS: 4 days   Consultants:  General surgery  Cardiology  Procedures:  Status post resection laparotomy Hartmann pouch placement 12/27/2018  Antimicrobials:  Peri-operative at this time  Interval history/Subjective:  Difficulty getting up and moving around and mobilizing pain Still using Fentanyl PCA Some red tinged material out of Ostomy Mod output out of NGT Overall peri-op issues with hypotension and hypoxia have resolved  Objective:  Vitals:  Vitals:   12/30/18 0834 12/30/18 0900  BP:  (!) 130/53  Pulse:  (!) 101  Resp: 17 (!) 29  Temp:    SpO2: 95% 94%    Exam:  Awake coherent was max assist at the bedside with 2 people assisting rating pain as Terrible EOMI NCAT Abdomen obese distended pouch Post-op Ostomy bag has been changed No lower extremity edema S1-S2 tachycardic afib noted-mostly rate sin the 110-120 ranges Neurologically intact moving all 4 limbs but this is somewhat incoherent because of pain/anesthesia   I have personally reviewed the following:  DATA   Labs:  BUN/creatinine 2.3-->129/3.0-->114/2.65-->66/1.32-->46/1.1  Hemoglobin down from 10.1-7.6--->2 u prbc 2/14-->10.3-->9.5-->9.3  PLT 250-->141-->128  Currently +355cc liters, NG out 3.4 liters overnight  Imaging studies:  none   Scheduled Meds: . chlorhexidine  15 mL Mouth Rinse BID  . Chlorhexidine Gluconate Cloth  6 each Topical Daily  . digoxin  0.125 mg Intravenous Daily  . fentaNYL   Intravenous Q4H  .  insulin aspart  0-15 Units Subcutaneous Q4H  . lip balm  1 application Topical BID  . mouth rinse  15 mL Mouth Rinse q12n4p   . pantoprazole (PROTONIX) IV  40 mg Intravenous Q24H  . sodium chloride flush  10-40 mL Intracatheter Q12H   Continuous Infusions: . sodium chloride Stopped (12/27/18 2232)  . dextrose 5 % 1,000 mL with potassium chloride 40 mEq infusion 75 mL/hr at 12/30/18 1105  . heparin 850 Units/hr (12/30/18 0842)  . methocarbamol (ROBAXIN) IV      Principal Problem:   Colonic obstruction due to metastatic colon cancer Active Problems:   Essential (primary) hypertension   Diabetes mellitus without complication (HCC)   AKI (acute kidney injury) ([place])   Iron deficiency anemia   Cancer of splenic flexure of colon   Atrial fibrillation, chronic   Anticoagulant long-term use   Cancer of sigmoid colon    History of adenomatous polyp of colon   Acute diastolic heart failure (HCC)   Liver metastases from liver cancer   CKD (chronic kidney disease) stage 3, GFR 30-59 ml/min (HCC)   Obesity (BMI 30-39.9)   Choledocholithiasis by MRCP   Status post partial colectomy Feb 2020   LOS: 4 days

## 2018-12-30 NOTE — Progress Notes (Signed)
ANTICOAGULATION CONSULT NOTE - Follow Up Consult  Pharmacy Consult for Heparin Indication: atrial fibrillation  Allergies  Allergen Reactions  . Metformin And Related Nausea And Vomiting  . Prednisone Nausea And Vomiting    Patient Measurements: Height: 5\' 3"  (160 cm) Weight: 190 lb 14.7 oz (86.6 kg) IBW/kg (Calculated) : 52.4 Heparin Dosing Weight:   Vital Signs: Temp: 98.1 F (36.7 C) (02/17 0002) Temp Source: Axillary (02/17 0002) BP: 137/59 (02/17 0200) Pulse Rate: 109 (02/17 0200)  Labs: Recent Labs    12/27/18 0325 12/27/18 0429 12/27/18 1436 12/28/18 0323 12/29/18 0400 12/29/18 2221 12/29/18 2230  HGB 9.4*  --  7.6* 10.3* 9.5*  --   --   HCT 31.2*  --  25.4* 33.7* 31.1*  --   --   PLT 284  --  261 250 141*  --   --   APTT 34  --   --   --   --  111*  --   LABPROT 23.7*  --   --  22.0*  --   --   --   INR 2.14  --   --  1.95  --   --   --   HEPARINUNFRC  --   --   --   --   --   --  0.86*  CREATININE 3.08*  --   --  2.65* 1.32*  --   --   TROPONINI  --  <0.03  --   --   --   --   --     Estimated Creatinine Clearance: 40.2 mL/min (A) (by C-G formula based on SCr of 1.32 mg/dL (H)).   Medications:  Infusions:  . sodium chloride Stopped (12/27/18 2232)  . cefoTEtan (CEFOTAN) IV Stopped (12/29/18 2145)  . heparin 850 Units/hr (12/30/18 0244)  . lactated ringers with kcl 75 mL/hr at 12/30/18 0235  . methocarbamol (ROBAXIN) IV      Assessment: Patient with heparin level and PTT above goal.  PTT ordered with Heparin level until both correlate due to possible drug-lab interaction between oral anticoagulant (rivaroxaban, edoxaban, or apixaban) and anti-Xa level (aka heparin level)   Will still use PTT at this time, feel the heparin level higher than PTT.  No heparin issues per RN.  Prior RPh noted MD aware of PLT drop and to continue without bolus.   Goal of Therapy:  Heparin level 0.3-0.7 units/ml aPTT 66-102 seconds Monitor platelets by anticoagulation  protocol: Yes   Plan:  Decrease heparin to 850 units/hr Recheck PTT and Heparin level at [address] [place] 12/30/2018,2:45 AM

## 2018-12-30 NOTE — Progress Notes (Addendum)
[facility] Progress Note  3 Days Post-Op  Subjective: CC: ileus Patient reports some abdominal pain but states it's not too bad. Has not been out of bed yet. Denies nausea.   Objective: Vital signs in last 24 hours: Temp:  [97.5 F (36.4 C)-98.8 F (37.1 C)] 98.3 F (36.8 C) (02/17 0356) Pulse Rate:  [81-114] 109 (02/17 0600) Resp:  [12-26] 14 (02/17 0600) BP: (130-174)/(45-78) 130/49 (02/17 0600) SpO2:  [92 %-98 %] 93 % (02/17 0600) Weight:  [82 kg-86.6 kg] 82 kg (02/17 0430) Last BM Date: 12/24/18  Intake/Output from previous day: 02/16 0701 - 02/17 0700 In: 2768.2 [P.O.:720; I.V.:1565.7; IV Piggyback:482.5] Out: 5750 [Urine:2350; Emesis/NG VPXTGG:2694] Intake/Output this shift: No intake/output data recorded.  PE: Gen:  Alert, NAD, pleasant Card:  Irregularly irregular Pulm:  Normal effort, clear to auscultation bilaterally Abd: Soft, appropriately tender, non-distended, bowel sounds hypoactive, midline incision with honeycomb in place, colostomy with small amount soft stool like output Skin: warm and dry, no rashes  Psych: A&Ox3   Lab Results:  Recent Labs    12/29/18 0400 12/30/18 0319  WBC 5.3 6.8  HGB 9.5* 9.3*  HCT 31.1* 30.0*  PLT 141* 128*   BMET Recent Labs    12/29/18 0400 12/30/18 0319  NA 147* 152*  K 3.2* 3.2*  CL 111 109  CO2 26 31  GLUCOSE 114* 104*  BUN 66* 46*  CREATININE 1.32* 1.10*  CALCIUM 6.8* 7.4*   PT/INR Recent Labs    12/28/18 0323  LABPROT 22.0*  INR 1.95   CMP     Component Value Date/Time   NA 152 (H) 12/30/2018 0319   K 3.2 (L) 12/30/2018 0319   CL 109 12/30/2018 0319   CO2 31 12/30/2018 0319   GLUCOSE 104 (H) 12/30/2018 0319   BUN 46 (H) 12/30/2018 0319   CREATININE 1.10 (H) 12/30/2018 0319   CREATININE 1.04 (H) 12/18/2018 1029   CALCIUM 7.4 (L) 12/30/2018 0319   PROT 4.4 (L) 12/30/2018 0319   ALBUMIN 1.5 (L) 12/30/2018 0319   AST 21 12/30/2018 0319   AST 60 (H) 12/18/2018 1029   ALT 23  12/30/2018 0319   ALT 117 (H) 12/18/2018 1029   ALKPHOS 87 12/30/2018 0319   BILITOT 0.9 12/30/2018 0319   BILITOT 0.5 12/18/2018 1029   GFRNONAA 50 (L) 12/30/2018 0319   GFRNONAA 54 (L) 12/18/2018 1029   GFRAA 58 (L) 12/30/2018 0319   GFRAA >60 12/18/2018 1029   Lipase     Component Value Date/Time   LIPASE 24 12/25/2018 2254       Studies/Results: No results found.  Anti-infectives: Anti-infectives (From admission, onward)   Start     Dose/Rate Route Frequency Ordered Stop   12/29/18 1000  cefoTEtan (CEFOTAN) 2 g in sodium chloride 0.9 % 100 mL IVPB  Status:  Discontinued     2 g 200 mL/hr over 30 Minutes Intravenous Every 24 hours 12/28/18 1654 12/29/18 0945   12/29/18 1000  cefoTEtan (CEFOTAN) 2 g in sodium chloride 0.9 % 100 mL IVPB     2 g 200 mL/hr over 30 Minutes Intravenous Every 12 hours 12/29/18 0945     12/27/18 2200  cefoTEtan (CEFOTAN) 2 g in sodium chloride 0.9 % 100 mL IVPB  Status:  Discontinued     2 g 200 mL/hr over 30 Minutes Intravenous Every 12 hours 12/27/18 1449 12/28/18 1654   12/27/18 1039  sodium chloride 0.9 % with cefoTEtan (CEFOTAN) ADS Med    Note  to Pharmacy:  Chrystine Oiler: cabinet override      12/27/18 1039 12/27/18 1130   12/26/18 1230  cefoTEtan (CEFOTAN) 2 g in sodium chloride 0.9 % 100 mL IVPB     2 g 200 mL/hr over 30 Minutes Intravenous On call to O.R. 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 Hassell Done  -POD#3 - NGT output too high to clamp today, start protonix for some clot material seen in NGT  - moderately differentiated adenocarcinomaof left colon withobstructing mass near the splenic flexure as well as a second colon cancer in the sigmoid colon- currently onchemotherapy- last dose 12/18/18? - expected post-op ileus - TPN to start today, MOBILIZE! Liver metastasis - per oncology    FEN: NPO, IVF; TPN to start today ID: d/c cefotetan today DVT: heparin gtt  LOS: 4 days    Brigid Re , [facility] 12/30/2018, 7:09 AM Pager: [phone] Consults: [phone]

## 2018-12-30 NOTE — Progress Notes (Signed)
NAME:  Cassandra Allen, MRN:  [id number], DOB:  [birth year], LOS: 4 ADMISSION DATE:  12/25/2018, CONSULTATION DATE:  2/15 REFERRING MD:  Verlon Au - TRH, CHIEF COMPLAINT:  Hypotension, pressor needs    Brief History   73 year old woman with hx AFib on eliquis, CHF, newly dx metastatic colon ca 10/25/18 who underwent hemicolectomy for newly obstructing sigmoid cancer.   We were asked to see for persistent post-operative hypotension and uncontrolled Afib on 2/15  Intraoperative findings of ischemia of sigmoid colon, associated fetid smell, but no frank peritoneal soiling.   Admitted to ICU on diltiazem IV and phenylephrine 2/14.   Consults:  Cardiology   Procedures:  Ex lap with partial colectomy and transverse colostomy >> 2/14  Significant Diagnostic Tests:  CT abd pelvis 2/13>>> Changes consistent with an obstructing lesion in the proximal descending colon consistent with the known history of colon carcinoma. The proximal colon as well as the entire small bowel is dilated secondary to the obstructive change. Second intraluminal mass within the sigmoid colon consistent with prior colonoscopy findings. Stable left adrenal lesion shown to represent adenoma on recent MRI. Hepatic metastatic disease similar to the prior MRI. Cholelithiasis without complicating factors.  Micro Data:    Antimicrobials:     Interim history/subjective:  Improving overall.  Awake.  Off pressors.  Off diltiazem gtt.  C/o pain. Using PCA.   Objective   Blood pressure (!) 130/53, pulse (!) 101, temperature 98.3 F (36.8 C), temperature source Axillary, resp. rate (!) 29, height 5\' 3"  (1.6 m), weight 82 kg, SpO2 94 %.        Intake/Output Summary (Last 24 hours) at 12/30/2018 1001 Last data filed at 12/30/2018 5784 Gross per 24 hour  Intake 2806.32 ml  Output 5375 ml  Net -2568.68 ml   Filed Weights   12/29/18 0325 12/29/18 0950 12/30/18 0430  Weight: 82.7 kg 86.6 kg 82 kg     Examination: General: Chronically ill-appearing female, no acute distress HENT: Mucous membranes moist, no JVD, NG tube Lungs: Respirations are even and nonlabored on nasal cannula, diminished bases otherwise clear Cardiovascular: 1 S2 irregular, A. fib rate 110 Abdomen: Soft, dressing intact, ostomy bag clean Extremities: Warm and dry scant BLE edema Neuro: Wake, alert, appropriate, getting OOB with PT  Resolved Hospital Problem list     Assessment & Plan:  Hypotension-multifactorial in setting of A. fib with RVR, diltiazem drip and?  Mild peritonitis resolved.  Off phenylephrine. Plan- Monitor volume status closely with history CHF Letter off pressors ABX complete  History A. fib-currently rate controlled Plan- Continue IV digoxin Continue IV heparin drip Holding home Eliquis We will add PRN IV metoprolol  History diastolic CHF Plan- Monitor volume status closely, consider gentle diuresis  Obstructing colon CA s/p exploratory lap 2/14 Liver metastasis Plan- Postop care per surgery Continue PCA for pain control TPN to begin today for nutrition Mobilize as able  Thrombocytopenia Plan- Monitor closely on heparin drip Follow-up CBC  Type 2 diabetes Plan- Sliding scale insulin  Patient improving slowly, off diltiazem drip, off pressors. PCCM will sign off, please call back if needed   Labs   CBC: Recent Labs  Lab 12/25/18 2254  12/27/18 0325 12/27/18 1436 12/28/18 0323 12/29/18 0400 12/30/18 0319  WBC 11.8*   < > 6.9 2.8* 7.1 5.3 6.8  NEUTROABS 10.3*  --   --   --  4.3 3.3 4.6  HGB 10.4*   < > 9.4* 7.6* 10.3* 9.5* 9.3*  HCT  33.2*   < > 31.2* 25.4* 33.7* 31.1* 30.0*  MCV 95.7   < > 96.3 98.8 96.0 96.0 96.8  PLT 309   < > 284 261 250 141* 128*   < > = values in this interval not displayed.    Basic Metabolic Panel: Recent Labs  Lab 12/26/18 0702 12/27/18 0325 12/27/18 1625 12/28/18 0323 12/28/18 0400 12/29/18 0400 12/30/18 0319  NA 137  139  --  142  --  147* 152*  K 4.1 3.9  --  3.3*  --  3.2* 3.2*  CL 103 100  --  108  --  111 109  CO2 22 21*  --  20*  --  26 31  GLUCOSE 181* 104* 134* 156*  --  114* 104*  BUN 119* 129*  --  114*  --  66* 46*  CREATININE 2.26* 3.08*  --  2.65*  --  1.32* 1.10*  CALCIUM 6.6* 6.2*  --  5.9*  --  6.8* 7.4*  MG  --   --   --  3.6*  --  2.8* 2.7*  PHOS  --   --   --   --  7.4*  --  3.3   GFR: Estimated Creatinine Clearance: 46.9 mL/min (A) (by C-G formula based on SCr of 1.1 mg/dL (H)). Recent Labs  Lab 12/27/18 1436 12/28/18 0323 12/29/18 0400 12/30/18 0319  WBC 2.8* 7.1 5.3 6.8    Liver Function Tests: Recent Labs  Lab 12/25/18 2254 12/28/18 0323 12/30/18 0319  AST 24 38 21  ALT 21 25 23   ALKPHOS 73 62 87  BILITOT 0.6 0.4 0.9  PROT 5.3* 4.5* 4.4*  ALBUMIN 2.7* 1.9* 1.5*   Recent Labs  Lab 12/25/18 2254  LIPASE 24   No results for input(s): AMMONIA in the last 168 hours.  ABG No results found for: PHART, PCO2ART, PO2ART, HCO3, TCO2, ACIDBASEDEF, O2SAT   Coagulation Profile: Recent Labs  Lab 12/27/18 0325 12/28/18 0323  INR 2.14 1.95    Cardiac Enzymes: Recent Labs  Lab 12/27/18 0429  TROPONINI <0.03    HbA1C: Hgb A1c MFr Bld  Date/Time Value Ref Range Status  08/09/2018 10:03 AM 5.6 4.8 - 5.6 % Final    Comment:    (NOTE) Pre diabetes:          5.7%-6.4% Diabetes:              >6.4% Glycemic control for   <7.0% adults with diabetes   03/26/2015 01:00 AM 7.0 (H) 4.8 - 5.6 % Final    Comment:    (NOTE)         Pre-diabetes: 5.7 - 6.4         Diabetes: >6.4         Glycemic control for adults with diabetes: <7.0     CBG: Recent Labs  Lab 12/29/18 1551 12/29/18 1927 12/29/18 2356 12/30/18 0315 12/30/18 [place]* [phone] [place], NP 12/30/2018  10:01 AM Pager: [phone] or [phone]

## 2018-12-30 NOTE — Progress Notes (Signed)
[facility] Progress Note Patient Name: Cassandra Allen DOB: 1946-03-22 MRN: [id number]   Date of Service  12/30/2018  HPI/Events of Note  Nursing questions the need for D5 0.45 NaCl IV infusion. TPN started today at 50 mL/hour and last blood glucose = 141.   eICU Interventions  Will D/C D5 0.45 NaCl IV infusion.      Intervention Category Major Interventions: Other:  Lysle Dingwall 12/30/2018, 9:18 PM

## 2018-12-30 NOTE — Anesthesia Postprocedure Evaluation (Signed)
Anesthesia Post Note  Patient: Cassandra Allen  Procedure(s) Performed: LEFT SIGMOID COLECTOMY WITH HARTMANN POUCH AND END COLOSTOMY (N/A Abdomen)     Patient location during evaluation: PACU Anesthesia Type: General Level of consciousness: awake and alert Pain management: pain level controlled Vital Signs Assessment: post-procedure vital signs reviewed and stable Respiratory status: spontaneous breathing, nonlabored ventilation, respiratory function stable and patient connected to nasal cannula oxygen Cardiovascular status: blood pressure returned to baseline and stable Postop Assessment: no apparent nausea or vomiting Anesthetic complications: no    Last Vitals:  Vitals:   12/30/18 1155 12/30/18 1257  BP:    Pulse:    Resp:  17  Temp: 37.1 C   SpO2:  94%    Last Pain:  Vitals:   12/30/18 1257  TempSrc:   PainSc: 5    Pain Goal: Patients Stated Pain Goal: 1 (12/29/18 0800)                 Tiajuana Amass

## 2018-12-30 NOTE — Progress Notes (Signed)
5 mL fentanyl syringe wasted from PCA pump. Bubba Hales, RN witnessed.

## 2018-12-30 NOTE — Progress Notes (Addendum)
ANTICOAGULATION CONSULT NOTE - Follow Up Consult  Pharmacy Consult for heparin Indication: atrial fibrillation  Allergies  Allergen Reactions  . Metformin And Related Nausea And Vomiting  . Prednisone Nausea And Vomiting    Patient Measurements: Height: 5\' 3"  (160 cm) Weight: 180 lb 12.4 oz (82 kg) IBW/kg (Calculated) : 52.4 Heparin Dosing Weight: 69.7 kg  Vital Signs: Temp: 98.7 F (37.1 C) (02/17 1155) Temp Source: Axillary (02/17 1155) BP: 168/69 (02/17 1800) Pulse Rate: 117 (02/17 1800)  Labs: Recent Labs    12/28/18 0323 12/29/18 0400 12/29/18 2221 12/29/18 2230 12/30/18 0319 12/30/18 1100 12/30/18 1800  HGB 10.3* 9.5*  --   --  9.3*  --   --   HCT 33.7* 31.1*  --   --  30.0*  --   --   PLT 250 141*  --   --  128*  --   --   APTT  --   --  111*  --   --  97*  --   LABPROT 22.0*  --   --   --   --   --   --   INR 1.95  --   --   --   --   --   --   HEPARINUNFRC  --   --   --  0.86*  --  0.47 0.46  CREATININE 2.65* 1.32*  --   --  1.10*  --   --     Estimated Creatinine Clearance: 46.9 mL/min (A) (by C-G formula based on SCr of 1.1 mg/dL (H)).   Medications:  Medications Prior to Admission  Medication Sig Dispense Refill Last Dose  . apixaban (ELIQUIS) 5 MG TABS tablet TAKE 1 TABLET (5 MG TOTAL) BY MOUTH 2 (TWO) TIMES DAILY. (Patient taking differently: Take 5 mg by mouth 2 (two) times daily. ) 180 tablet 2 12/25/2018 at 800pm  . diltiazem (CARDIZEM CD) 120 MG 24 hr capsule Take 1 capsule (120 mg total) by mouth 2 (two) times daily. 180 capsule 3 12/25/2018 at Unknown time  . furosemide (LASIX) 20 MG tablet Lasix 20 mg po daily PRN for weight of greater than 3 lbs in a day or 5 lbs in a week 30 tablet 11 Past Week at Unknown time  . HYDROcodone-acetaminophen (NORCO) 7.5-325 MG tablet Take 1 tablet by mouth 2 (two) times daily as needed (pain).    12/25/2018 at Unknown time  . insulin detemir (LEVEMIR) 100 UNIT/ML injection Inject 0.07 mLs (7 Units total) into the  skin daily before breakfast. 10 mL 11 12/25/2018 at Unknown time  . liraglutide (VICTOZA) 18 MG/3ML SOPN Inject 1.8 mg into the skin daily before breakfast.    12/25/2018 at Unknown time  . Multiple Vitamin (MULTIVITAMIN WITH MINERALS) TABS tablet Take 2 tablets by mouth daily at 12 noon.    Past Week at Unknown time  . oxybutynin (DITROPAN) 5 MG tablet Take 20 mg by mouth daily.   Past Week at Unknown time  . oxybutynin (DITROPAN-XL) 10 MG 24 hr tablet Take 10 mg by mouth daily.     . sertraline (ZOLOFT) 100 MG tablet Take 100 mg by mouth daily.    Past Week at Unknown time  . simvastatin (ZOCOR) 20 MG tablet Take 20 mg by mouth at bedtime.    12/25/2018 at Unknown time    Assessment: 72 yoF on apixaban 5 mg po BID PTA for afib - last dose 2/13 at 2000. S/p OR 2/14 SBR. Pharmacy consulted  to dose heparin for afib with no bolus today. 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  Today, 12/30/18  Heparin level remains stable and therapeutic on current rate of 850 units/hr  No reported bleeding  Hgb stable, Plts 128 dropped from 141  HIT Ab returned at 0.434 (weakly elevated)  Discussed borderline result with E-Link MD; my best estimated 4T score is </= 3 (recent surgery, no thrombosis; early platelet fall but did have multiple exposures to heparin flushes at [facility]) - per [facility] HIT algorithm, for 4T score </= 5, disregard OD of < 0.6 and continue heparin; CCM in agreement as long as CBC closely followed  Goal of Therapy: Heparin level 0.3-0.7 units/ml Monitor platelets by anticoagulation protocol: Yes  Plan:  Continue heparin IV infusion at 850 units/hr  Daily CBC and heparin level - f/u Plt trajectory closely  Monitor for signs of bleeding or thrombosis   Reuel Boom, PharmD, BCPS [phone] 12/30/2018, 8:00 PM

## 2018-12-30 NOTE — Progress Notes (Signed)
Initial Nutrition Assessment  DOCUMENTATION CODES:   Obesity unspecified  INTERVENTION:   Monitor magnesium, potassium, and phosphorus daily for at least 3 days, MD to replete as needed, as pt is at risk for refeeding syndrome given poor PO intakes for >1 week.  TPN per Pharmacy  NUTRITION DIAGNOSIS:   Increased nutrient needs related to post-op healing, cancer and cancer related treatments as evidenced by estimated needs.  GOAL:   Patient will meet greater than or equal to 90% of their needs  MONITOR:   Labs, Weight trends, I & O's, Diet advancement(TPN)  REASON FOR ASSESSMENT:   Consult New TPN/TNA  ASSESSMENT:   73 year old woman with hx AFib on eliquis, CHF, newly dx metastatic colon ca 10/25/18 who underwent hemicolectomy for newly obstructing sigmoid cancer.  2/14: s/p Ex lap with partial colectomy and transverse colostomy   Patient in room with NGT in placed, current output observed ~500 ml. Pt states she feels "terrible". Pt states she has not eaten for ~ 1 week now. Prior to that she states she still wasn't eating that much. Denies any N/V at that time.   Pt unable to state UBW. Per weight records, pt lost 9 lb since 1/17 (5% wt loss x 1 month, significant for time frame).  Pt at risk of developing malnutrition. TPN to be initiated today.   Medications: D5 with KCl infusion, Lactated ringers infusion  Labs reviewed: Elevated Na, Mg Low K  Phos WNL GFR: 50  NUTRITION - FOCUSED PHYSICAL EXAM:  Nutrition focused physical exam shows no sign of depletion of muscle mass or body fat.  Diet Order:   Diet Order            Diet NPO time specified Except for: Ice Chips  Diet effective now              EDUCATION NEEDS:   Not appropriate for education at this time  Skin:  Skin Assessment: Reviewed RN Assessment  Last BM:  2/11  Height:   Ht Readings from Last 1 Encounters:  12/25/18 5\' 3"  (1.6 m)    Weight:   Wt Readings from Last 1 Encounters:   12/30/18 82 kg    Ideal Body Weight:  52.3 kg  BMI:  Body mass index is 32.02 kg/m.  Estimated Nutritional Needs:   Kcal:  1650-1850  Protein:  75-85g  Fluid:  1.8L/day  Clayton Bibles, MS, RD, LDN [place] Dietitian Pager: [phone] After Hours Pager: [phone]

## 2018-12-30 NOTE — Evaluation (Signed)
Physical Therapy Evaluation Patient Details Name: Cassandra Allen MRN: [id number] DOB: 1946-08-03 Today's Date: 12/30/2018   History of Present Illness  73 year old woman with hx AFib on eliquis, CHF, DM, and newly dx metastatic colon ca 10/25/18 and currently status post exploratory laparotomy with partial colectomy and transverse colostomy on 12/27/18     Clinical Impression  Pt admitted with above diagnosis. Pt currently with functional limitations due to the deficits listed below (see PT Problem List).  Pt will benefit from skilled PT to increase their independence and safety with mobility to allow discharge to the venue listed below.  Pt with decreased endurance and generalized weakness.  Pt has not been OOB in days.  Pt unable to accept weight through LEs so unable to assist with standing edge of bed.  Recommend nursing use lift equipment for OOB at this time.  Pt would benefit from SNF upon d/c.     Follow Up Recommendations SNF    Equipment Recommendations  None recommended by PT    Recommendations for Other Services       Precautions / Restrictions Precautions Precautions: Fall Precaution Comments: multiple lines/leads, colostomy, NG tube      Mobility  Bed Mobility Overal bed mobility: Needs Assistance Bed Mobility: Supine to Sit;Sit to Supine     Supine to sit: Max assist;+2 for physical assistance;HOB elevated Sit to supine: Total assist;+2 for physical assistance   General bed mobility comments: encouraged log roll technique however pt not performing, assist with LEs over EOB and trunk upright, total assist for return back to bed due to weakness/pain/fatigue  Transfers                 General transfer comment: attempted x3 however pt unable to bear weight on LEs, would benefit from lift equipment at this time  Ambulation/Gait                Stairs            Wheelchair Mobility    Modified Rankin (Stroke Patients Only)        Balance Overall balance assessment: Needs assistance Sitting-balance support: Single extremity supported;Feet supported Sitting balance-Leahy Scale: Poor                                       Pertinent Vitals/Pain Pain Assessment: Faces Faces Pain Scale: Hurts even more Pain Location: abdomen Pain Descriptors / Indicators: Aching;Sore;Grimacing;Guarding Pain Intervention(s): Monitored during session;Limited activity within patient's tolerance;PCA encouraged;Repositioned    Home Living Family/patient expects to be discharged to:: Private residence Living Arrangements: Alone Available Help at Discharge: Family;Available PRN/intermittently Type of Home: Apartment Home Access: Level entry     Home Layout: One level Home Equipment: Cane - single point;Walker - 4 wheels      Prior Function Level of Independence: Independent with assistive device(s)         Comments: cane in apt, rollator in community. Drives. Does her own grocery shopping. (from recent admission - pt flat and not forthcoming with information at time of evaluation)     Hand Dominance        Extremity/Trunk Assessment        Lower Extremity Assessment Lower Extremity Assessment: Generalized weakness       Communication   Communication: No difficulties  Cognition Arousal/Alertness: Awake/alert Behavior During Therapy: WFL for tasks assessed/performed Overall Cognitive Status: Within Functional Limits for  tasks assessed                                        General Comments      Exercises     Assessment/Plan    PT Assessment Patient needs continued PT services  PT Problem List Decreased balance;Pain;Decreased mobility;Decreased activity tolerance;Decreased strength;Decreased knowledge of use of DME       PT Treatment Interventions Functional mobility training;Balance training;Patient/family education;Gait training;Therapeutic activities;Therapeutic  exercise;Wheelchair mobility training    PT Goals (Current goals can be found in the Care Plan section)  Acute Rehab PT Goals PT Goal Formulation: With patient Time For Goal Achievement: 01/13/19 Potential to Achieve Goals: Good    Frequency Min 2X/week   Barriers to discharge        Co-evaluation               AM-PAC PT "6 Clicks" Mobility  Outcome Measure Help needed turning from your back to your side while in a flat bed without using bedrails?: A Little Help needed moving from lying on your back to sitting on the side of a flat bed without using bedrails?: A Lot Help needed moving to and from a bed to a chair (including a wheelchair)?: Total Help needed standing up from a chair using your arms (e.g., wheelchair or bedside chair)?: Total Help needed to walk in hospital room?: Total Help needed climbing 3-5 steps with a railing? : Total 6 Click Score: 9    End of Session Equipment Utilized During Treatment: Gait belt;Oxygen Activity Tolerance: Patient limited by fatigue;Patient limited by pain Patient left: in bed;with call bell/phone within reach;with bed alarm set Nurse Communication: Mobility status(RN present and assisted with session) PT Visit Diagnosis: Difficulty in walking, not elsewhere classified (R26.2);Pain;Muscle weakness (generalized) (M62.81)    Time: 5027-7412 PT Time Calculation (min) (ACUTE ONLY): 39 min   Charges:   PT Evaluation $PT Eval Moderate Complexity: Kaycee, PT, DPT Acute Rehabilitation Services Office: [phone] Pager: [phone]  Trena Platt 12/30/2018, 12:36 PM

## 2018-12-30 NOTE — Consult Note (Signed)
[facility] Nurse ostomy consult note Stoma type/location: RLQ end colostomy.  Flush, dark and dry this AM.  Recessed from 3 - 6 o'clock. Scant brown effluent in pouch.  Bedside RN is notified and will be calling surgical team.  Pouch that was removed was opaque, obscuring stomal view.  Stoma is noted as dark 12/29/18 by surgery team.  Stomal assessment/size: 1 3/8" dark, dry and recessed.  Added barrier ring and using 1 piece convex pouch, cut off center to accommodate midline abdominal incision.  Peristomal assessment: intact, recessed stoma is a challenge Treatment options for stomal/peristomal skin: barrier ring and convexity Output scant soft brown stool Ostomy pouching: 1pc.convex Education provided: Pouch change performed.  Patient with NG tube in place and minimally participative.  Informed that pouch changes would be twice weekly and emptying several times daily. She lives home alone  Enrolled patient in [place] program: No  Will assess new appliance. [place] team will follow.  Domenic Moras MSN, RN, FNP-BC [facility] Wound, Ostomy, Continence Nurse Pager [phone]

## 2018-12-30 NOTE — Progress Notes (Signed)
[place] NOTE   Pharmacy Consult for TPN Indication: prolonged ileus  Patient Measurements: Height: 5\' 3"  (160 cm) Weight: 180 lb 12.4 oz (82 kg) IBW/kg (Calculated) : 52.4 TPN AdjBW (KG): 59.1 Body mass index is 32.02 kg/m.  Insulin Requirements: none  Current Nutrition: NPO  IVF: D5W with 72mEq KCL at 75 ml/hr  Central access: PAC TPN start date: 2/17  ASSESSMENT                                                                                                          HPI: 73 yo female s/p laparotomy with mobilization of splenic flexure and resection of the sigmoid, descending and distal transverse colon with hartman pouch and end colostomy on 2/14 due to metastatic colon cancer with total near obstruction. Patient now with NG decompression and expected post op ileus to start TPN per pharmacy this PM  Significant events:   Today:   Glucose - CBGs stable prior to start of TPN  Electrolytes - Na 152 and rising, K low at 3.2, mag elevated at 2.7 - note that pt currently on D5W IVs and KCl orders for repletion have been placed by Md  Renal - SCr 1.1 improving  LFTs - WNL stable  TGs - 182 (2/16)  Prealbumin - to be ordered  NUTRITIONAL GOALS                                                                                             RD recs: pending  TPN at a goal rate of 35ml/hr:  50g/L protein - 90g to provide 360 kcal 15% Dextrose to provide 918 kcal  30% Lipid concentration to provide 540 kcal Total 1800Kcal/day.  PLAN                                                                                                                         At 1800 today:  Start TPN at rate of 50 ml/hr this PM  Will use standard electrolyte   Plan to advance as tolerated to the goal rate.  TPN to contain standard multivitamins and trace elements.  Reduce IVF from  75 to 25 ml/hr.  Continue moderate scale SSI at q4  TPN lab panels on  Mondays & Thursdays.  F/u daily.   Kara Mead 12/30/2018,10:37 AM

## 2018-12-31 ENCOUNTER — Inpatient Hospital Stay (HOSPITAL_COMMUNITY): Payer: Medicare HMO

## 2018-12-31 ENCOUNTER — Ambulatory Visit: Payer: Medicare HMO | Admitting: Adult Health

## 2018-12-31 DIAGNOSIS — K56609 Unspecified intestinal obstruction, unspecified as to partial versus complete obstruction: Secondary | ICD-10-CM

## 2018-12-31 LAB — COMPREHENSIVE METABOLIC PANEL
ALT: 20 U/L (ref 0–44)
AST: 20 U/L (ref 15–41)
Albumin: 1.5 g/dL — ABNORMAL LOW (ref 3.5–5.0)
Alkaline Phosphatase: 84 U/L (ref 38–126)
Anion gap: 8 (ref 5–15)
BUN: 36 mg/dL — AB (ref 8–23)
CO2: 35 mmol/L — ABNORMAL HIGH (ref 22–32)
Calcium: 7.7 mg/dL — ABNORMAL LOW (ref 8.9–10.3)
Chloride: 108 mmol/L (ref 98–111)
Creatinine, Ser: 0.85 mg/dL (ref 0.44–1.00)
GFR calc Af Amer: >60 mL/min (ref >60)
Glucose, Bld: 258 mg/dL — ABNORMAL HIGH (ref 70–99)
Potassium: 3.1 mmol/L — ABNORMAL LOW (ref 3.5–5.1)
Sodium: 151 mmol/L — ABNORMAL HIGH (ref 135–145)
Total Bilirubin: 0.6 mg/dL (ref 0.3–1.2)
Total Protein: 4.7 g/dL — ABNORMAL LOW (ref 6.5–8.1)

## 2018-12-31 LAB — GLUCOSE, CAPILLARY
GLUCOSE-CAPILLARY: 259 mg/dL — AB (ref 70–99)
Glucose-Capillary: 224 mg/dL — ABNORMAL HIGH (ref 70–99)
Glucose-Capillary: 269 mg/dL — ABNORMAL HIGH (ref 70–99)
Glucose-Capillary: 284 mg/dL — ABNORMAL HIGH (ref 70–99)
Glucose-Capillary: 231 mg/dL — ABNORMAL HIGH (ref 70–99)
Glucose-Capillary: 178 mg/dL — ABNORMAL HIGH (ref 70–99)

## 2018-12-31 LAB — CBC
HCT: 32.5 % — ABNORMAL LOW (ref 36.0–46.0)
Hemoglobin: 10.1 g/dL — ABNORMAL LOW (ref 12.0–15.0)
MCH: 30.2 pg (ref 26.0–34.0)
MCHC: 31.1 g/dL (ref 30.0–36.0)
MCV: 97.3 fL (ref 80.0–100.0)
Platelets: 158 10*3/uL (ref 150–400)
RBC: 3.34 MIL/uL — AB (ref 3.87–5.11)
RDW: 16.5 % — ABNORMAL HIGH (ref 11.5–15.5)
WBC: 9.2 10*3/uL (ref 4.0–10.5)
nRBC: 1 % — ABNORMAL HIGH (ref 0.0–0.2)

## 2018-12-31 LAB — PREALBUMIN: Prealbumin: 5.6 mg/dL — ABNORMAL LOW (ref 18–38)

## 2018-12-31 LAB — TRIGLYCERIDES: TRIGLYCERIDES: 202 mg/dL — AB (ref <150)

## 2018-12-31 LAB — HEPARIN LEVEL (UNFRACTIONATED): Heparin Unfractionated: 0.39 IU/mL (ref 0.30–0.70)

## 2018-12-31 LAB — PHOSPHORUS: Phosphorus: 2 mg/dL — ABNORMAL LOW (ref 2.5–4.6)

## 2018-12-31 LAB — MAGNESIUM: Magnesium: 2.4 mg/dL (ref 1.7–2.4)

## 2018-12-31 MED ORDER — FUROSEMIDE 10 MG/ML IJ SOLN
40.0000 mg | Freq: Once | INTRAMUSCULAR | Status: AC
  Administered 2018-12-31: 40 mg via INTRAVENOUS
  Filled 2018-12-31: qty 4

## 2018-12-31 MED ORDER — HYDRALAZINE HCL 20 MG/ML IJ SOLN
10.0000 mg | INTRAMUSCULAR | Status: DC | PRN
  Filled 2018-12-31: qty 1

## 2018-12-31 MED ORDER — DEXTROSE 5 % IV SOLN
INTRAVENOUS | Status: DC
  Administered 2018-12-31: 10:00:00 via INTRAVENOUS

## 2018-12-31 MED ORDER — HYDRALAZINE HCL 20 MG/ML IJ SOLN
10.0000 mg | INTRAMUSCULAR | Status: DC | PRN
  Administered 2018-12-31: 20 mg via INTRAVENOUS

## 2018-12-31 MED ORDER — POTASSIUM CHLORIDE 10 MEQ/100ML IV SOLN
10.0000 meq | INTRAVENOUS | Status: AC
  Administered 2018-12-31 (×3): 10 meq via INTRAVENOUS
  Filled 2018-12-31 (×3): qty 100

## 2018-12-31 MED ORDER — FENTANYL CITRATE (PF) 100 MCG/2ML IJ SOLN
25.0000 ug | INTRAMUSCULAR | Status: DC | PRN
  Administered 2018-12-31 – 2019-01-02 (×11): 50 ug via INTRAVENOUS
  Administered 2019-01-02 (×2): 25 ug via INTRAVENOUS
  Administered 2019-01-02: 50 ug via INTRAVENOUS
  Administered 2019-01-03: 25 ug via INTRAVENOUS
  Filled 2018-12-31 (×16): qty 2

## 2018-12-31 MED ORDER — TRAVASOL 10 % IV SOLN
INTRAVENOUS | Status: DC
  Filled 2018-12-31: qty 600

## 2018-12-31 MED ORDER — INSULIN ASPART 100 UNIT/ML ~~LOC~~ SOLN
6.0000 [IU] | SUBCUTANEOUS | Status: AC
  Administered 2018-12-31 (×3): 6 [IU] via SUBCUTANEOUS

## 2018-12-31 MED ORDER — POTASSIUM CHLORIDE 10 MEQ/50ML IV SOLN
10.0000 meq | INTRAVENOUS | Status: DC
  Administered 2018-12-31: 10 meq via INTRAVENOUS
  Filled 2018-12-31: qty 50

## 2018-12-31 MED ORDER — TRAVASOL 10 % IV SOLN
INTRAVENOUS | Status: AC
  Administered 2018-12-31: 18:00:00 via INTRAVENOUS
  Filled 2018-12-31: qty 600

## 2018-12-31 MED ORDER — INSULIN ASPART 100 UNIT/ML ~~LOC~~ SOLN
0.0000 [IU] | SUBCUTANEOUS | Status: DC
  Administered 2018-12-31: 11 [IU] via SUBCUTANEOUS
  Administered 2018-12-31: 7 [IU] via SUBCUTANEOUS
  Administered 2018-12-31: 11 [IU] via SUBCUTANEOUS
  Administered 2019-01-01 (×5): 4 [IU] via SUBCUTANEOUS
  Administered 2019-01-01 – 2019-01-02 (×3): 7 [IU] via SUBCUTANEOUS
  Administered 2019-01-02: 4 [IU] via SUBCUTANEOUS

## 2018-12-31 MED ORDER — POTASSIUM PHOSPHATES 15 MMOLE/5ML IV SOLN
15.0000 mmol | Freq: Once | INTRAVENOUS | Status: AC
  Administered 2018-12-31: 15 mmol via INTRAVENOUS
  Filled 2018-12-31: qty 5

## 2018-12-31 MED ORDER — DEXTROSE 5 % IV SOLN
INTRAVENOUS | Status: AC
  Administered 2018-12-31: 11:00:00 via INTRAVENOUS

## 2018-12-31 MED ORDER — METOPROLOL TARTRATE 5 MG/5ML IV SOLN
2.5000 mg | Freq: Four times a day (QID) | INTRAVENOUS | Status: DC
  Administered 2018-12-31 – 2019-01-01 (×4): 2.5 mg via INTRAVENOUS
  Filled 2018-12-31 (×4): qty 5

## 2018-12-31 MED ORDER — PANTOPRAZOLE SODIUM 40 MG IV SOLR
40.0000 mg | Freq: Two times a day (BID) | INTRAVENOUS | Status: DC
  Administered 2018-12-31 – 2019-01-01 (×3): 40 mg via INTRAVENOUS
  Filled 2018-12-31 (×3): qty 40

## 2018-12-31 MED ORDER — ACETAMINOPHEN 10 MG/ML IV SOLN
1000.0000 mg | Freq: Four times a day (QID) | INTRAVENOUS | Status: AC
  Administered 2018-12-31 – 2019-01-01 (×4): 1000 mg via INTRAVENOUS
  Filled 2018-12-31 (×4): qty 100

## 2018-12-31 NOTE — Progress Notes (Signed)
Cassandra Allen   DOB:10-20-1946   QZ#:[id number]   FHL#:[id number]  Oncology follow-up note  Subjective: Pt underwent left hemicolectomy by Dr. Hassell Done last Friday, she is recovering in the ICU.  Still feels very fatigued, drowsy, not able to sleep well, intermittent pain in the incision site is tolerable.  She is hemodynamically stable, and afebrile. She still has NG tube in.   Objective:  Vitals:   12/31/18 0646 12/31/18 0800  BP: (!) 176/77 (!) 179/66  Pulse: (!) 114 (!) 108  Resp: (!) 22 (!) 31  Temp:  98.3 F (36.8 C)  SpO2: (!) 86% 92%    Body mass index is 32.06 kg/m.  Intake/Output Summary (Last 24 hours) at 12/31/2018 1317 Last data filed at 12/31/2018 1000 Gross per 24 hour  Intake 1094.49 ml  Output 3005 ml  Net -1910.51 ml     Sclerae unicteric  Oropharynx clear  No peripheral adenopathy  Lungs clear -- no rales or rhonchi  Heart regular rate and rhythm  Abdomen soft, with diffuse tenderness, incision covered   MSK no focal spinal tenderness, no peripheral edema  Neuro nonfocal    CBG (last 3)  Recent Labs    12/30/18 2331 12/31/18 0407 12/31/18 0837  GLUCAP 186* 224* 259*     Labs:  Lab Results  Component Value Date   WBC 9.2 12/31/2018   HGB 10.1 (L) 12/31/2018   HCT 32.5 (L) 12/31/2018   MCV 97.3 12/31/2018   PLT 158 12/31/2018   NEUTROABS 4.6 12/30/2018   CMP Latest Ref Rng & Units 12/31/2018 12/30/2018 12/29/2018  Glucose 70 - 99 mg/dL 258(H) 104(H) 114(H)  BUN 8 - 23 mg/dL 36(H) 46(H) 66(H)  Creatinine 0.44 - 1.00 mg/dL 0.85 1.10(H) 1.32(H)  Sodium 135 - 145 mmol/L 151(H) 152(H) 147(H)  Potassium 3.5 - 5.1 mmol/L 3.1(L) 3.2(L) 3.2(L)  Chloride 98 - 111 mmol/L 108 109 111  CO2 22 - 32 mmol/L 35(H) 31 26  Calcium 8.9 - 10.3 mg/dL 7.7(L) 7.4(L) 6.8(L)  Total Protein 6.5 - 8.1 g/dL 4.7(L) 4.4(L) -  Total Bilirubin 0.3 - 1.2 mg/dL 0.6 0.9 -  Alkaline Phos 38 - 126 U/L 84 87 -  AST 15 - 41 U/L 20 21 -  ALT 0 - 44 U/L 20 23 -     Urine  Studies No results for input(s): UHGB, CRYS in the last 72 hours.  Invalid input(s): UACOL, UAPR, USPG, UPH, UTP, UGL, UKET, UBIL, UNIT, UROB, [place], UEPI, UWBC, [employer], [place], [place], [place], [place]  Basic Metabolic Panel: Recent Labs  Lab 12/27/18 0325 12/27/18 1625 12/28/18 0323 12/28/18 0400 12/29/18 0400 12/30/18 0319 12/31/18 0337  NA 139  --  142  --  147* 152* 151*  K 3.9  --  3.3*  --  3.2* 3.2* 3.1*  CL 100  --  108  --  111 109 108  CO2 21*  --  20*  --  26 31 35*  GLUCOSE 104* 134* 156*  --  114* 104* 258*  BUN 129*  --  114*  --  66* 46* 36*  CREATININE 3.08*  --  2.65*  --  1.32* 1.10* 0.85  CALCIUM 6.2*  --  5.9*  --  6.8* 7.4* 7.7*  MG  --   --  3.6*  --  2.8* 2.7* 2.4  PHOS  --   --   --  7.4*  --  3.3 2.0*   GFR Estimated Creatinine Clearance: 60.7 mL/min (by C-G formula based on  SCr of 0.85 mg/dL). Liver Function Tests: Recent Labs  Lab 12/25/18 2254 12/28/18 0323 12/30/18 0319 12/31/18 0337  AST 24 38 21 20  ALT 21 25 23 20   ALKPHOS 73 62 87 84  BILITOT 0.6 0.4 0.9 0.6  PROT 5.3* 4.5* 4.4* 4.7*  ALBUMIN 2.7* 1.9* 1.5* 1.5*   Recent Labs  Lab 12/25/18 2254  LIPASE 24   No results for input(s): AMMONIA in the last 168 hours. Coagulation profile Recent Labs  Lab 12/27/18 0325 12/28/18 0323  INR 2.14 1.95    CBC: Recent Labs  Lab 12/25/18 2254  12/27/18 1436 12/28/18 0323 12/29/18 0400 12/30/18 0319 12/31/18 0750  WBC 11.8*   < > 2.8* 7.1 5.3 6.8 9.2  NEUTROABS 10.3*  --   --  4.3 3.3 4.6  --   HGB 10.4*   < > 7.6* 10.3* 9.5* 9.3* 10.1*  HCT 33.2*   < > 25.4* 33.7* 31.1* 30.0* 32.5*  MCV 95.7   < > 98.8 96.0 96.0 96.8 97.3  PLT 309   < > 261 250 141* 128* 158   < > = values in this interval not displayed.   Cardiac Enzymes: Recent Labs  Lab 12/27/18 0429  TROPONINI <0.03   BNP: Invalid input(s): POCBNP CBG: Recent Labs  Lab 12/30/18 1600 12/30/18 1950 12/30/18 2331 12/31/18 0407 12/31/18 0837  GLUCAP 127* 141* 186* 224*  259*   D-Dimer No results for input(s): DDIMER in the last 72 hours. Hgb A1c No results for input(s): HGBA1C in the last 72 hours. Lipid Profile Recent Labs    12/30/18 0319 12/31/18 0337  TRIG 182* 202*   Thyroid function studies No results for input(s): TSH, T4TOTAL, T3FREE, THYROIDAB in the last 72 hours.  Invalid input(s): FREET3 Anemia work up No results for input(s): VITAMINB12, FOLATE, FERRITIN, TIBC, IRON, RETICCTPCT in the last 72 hours. Microbiology Recent Results (from the past 240 hour(s))  Surgical pcr screen     Status: None   Collection Time: 12/26/18  8:53 PM  Result Value Ref Range Status   MRSA, PCR NEGATIVE NEGATIVE Final   Staphylococcus aureus NEGATIVE NEGATIVE Final    Comment: (NOTE) The Xpert SA Assay (FDA approved for NASAL specimens in patients 61 years of age and older), is one component of a comprehensive surveillance program. It is not intended to diagnose infection nor to guide or monitor treatment. Performed at [facility], [place] [address], [place], [place] [address]       Studies:  Dg Abd 1 View  Result Date: 12/31/2018 CLINICAL DATA:  72 year old female enteric tube placement. Postoperative day 4 distal large bowel resection due to obstructing tumor. EXAM: ABDOMEN - 1 VIEW COMPARISON:  12/26/2018. FINDINGS: AP supine view at 0120 hours. Enteric tube tip is at the level of the gastric body. The side hole is at the level of the gastric fundus. Resolved gas-filled transverse colon, but continued gas-filled small bowel loops in the lower abdomen and pelvis which are at the upper limits of normal to mildly dilated. New lower abdominal skin staples. Negative lung bases. No acute osseous abnormality identified. IMPRESSION: 1. Enteric tube in the stomach, side hole the level of the fundus. 2. Improved bowel gas pattern since 12/26/2018. Borderline to mildly dilated small bowel loops probably reflect postoperative ileus.  Electronically Signed   By: Genevie Ann M.D.   On: 12/31/2018 02:38   Dg Chest Port 1 View  Result Date: 12/31/2018 CLINICAL DATA:  wheezing EXAM: PORTABLE CHEST  1 VIEW COMPARISON:  12/21/2018 FINDINGS: Patient has RIGHT-sided PowerPort, tip to the superior vena cava. Nasogastric tube has been placed, tip overlying the level of the proximal stomach. The heart is enlarged. There is pulmonary vascular congestion but no alveolar edema. No consolidations. IMPRESSION: Cardiomegaly and vascular congestion. Electronically Signed   By: Nolon Nations M.D.   On: 12/31/2018 13:01    Assessment: 73 y.o. with metastatic colon cancer, iron deficient anemia, admitted for bowel obstruction secondary to colon mass  1.  Bowel obstruction secondary to colon cancer, s/p left hemicolectomy  2.  Atrial fibrillation with rapid ventricular response, rate controlled now  3.  Hypotension secondary to dehydration and A. fib, resolved now 4. Acute on chronic renal failure, much improved  5.  Anemia secondary to GI bleeding from the tumor, and chemotherapy 6.  Metastatic left colon cancer, status post chemotherapy FOLFOX for 2 cycles, last dose 12/18/2018, status post a left hemicolectomy on December 27, 2018 7.  History of diastolic heart failure, recent hospitalization for CHF exacerbation  8.  She was on Eliquis for atrial fibrillation, last dose 2/12 evening, now on heparin drip 9.  Transit mild thrombocytopenia after surgery, platelet normalized today   Plan:  -Patient tolerated left hemicolectomy surgery well, is recovering. -I discussed surgical pathology findings, which showed a T3 colon cancer, with complete obstruction, 22 lymph nodes were negative, or surgical margins were negative.  She also had a large polyp/villous adenoma in the resected sigmoid colon -Dr. Hassell Done was not able to do liver biopsy -She had transient mild thrombocytopenia after surgery, heparin induced platelet antibody was slightly positive,  giving the timing, and spontaneous resolving platelet count, I do not think she has HIT, will continue monitoring cbc  -I encourage patient to focus on recovery, I do not plan to give adjuvant chemotherapy in the near future, until she recovers well.  We will monitor her liver lesions closely. -I will f/u as needed   Truitt Merle, MD 12/31/2018  1:17 PM

## 2018-12-31 NOTE — Progress Notes (Signed)
PCA Fentanyl d/c per order.20 cc wasted in sink by Billee Cashing. Earma Reading RN witnessed waste.

## 2018-12-31 NOTE — Progress Notes (Signed)
Inpatient Diabetes Program Recommendations  AACE/ADA: New Consensus Statement on Inpatient Glycemic Control (2015)  Target Ranges:  Prepandial:   less than 140 mg/dL      Peak postprandial:   less than 180 mg/dL (1-2 hours)      Critically ill patients:  140 - 180 mg/dL   Lab Results  Component Value Date   GLUCAP 259 (H) 12/31/2018   HGBA1C 5.6 08/09/2018    Review of Glycemic Control  Diabetes history: DM2 Outpatient Diabetes medications: Levemir 7 units QD, Victoza 1.8 mg QD Current orders for Inpatient glycemic control: Novolog 0-20 units Q4H.  FBS this am 258 mg/dL. Would benefit from addition of basal insulin. On Levemir at home.    Inpatient Diabetes Program Recommendations:     Add Levemir 6 units Q24H.  Will continue to follow.  Thank you. Lorenda Peck, RD, LDN, CDE Inpatient Diabetes Coordinator [phone]

## 2018-12-31 NOTE — Progress Notes (Signed)
[facility] Progress Note  4 Days Post-Op  Subjective: CC: pain Patient tearful and appears very uncomfortable. Reports abdominal pain. Has not been using PCA much.  Increased tachycardia and HTN overnight.   Objective: Vital signs in last 24 hours: Temp:  [98.2 F (36.8 C)-98.7 F (37.1 C)] 98.3 F (36.8 C) (02/18 0412) Pulse Rate:  [91-117] 114 (02/18 0646) Resp:  [15-30] 22 (02/18 0646) BP: (130-183)/(46-88) 176/77 (02/18 0646) SpO2:  [86 %-98 %] 86 % (02/18 0646) Weight:  [82 kg-82.1 kg] 82.1 kg (02/18 0412) Last BM Date: 12/30/18  Intake/Output from previous day: 02/17 0701 - 02/18 0700 In: 1274.8 [P.O.:320; I.V.:954.8] Out: 2860 [Urine:1175; Emesis/NG output:1680; Stool:5] Intake/Output this shift: No intake/output data recorded.  PE: Gen:  Alert, uncomfortable Card:  Irregularly irregular, tachycardic, HTN Pulm:  Increased effort, on 5L O2 via [place], shallow breaths Abd: Soft, diffusely TTP, non-distended, bowel sounds hypoactive, midline incision with staples present and some dried blood over dermabond, stoma dark and slightly retracted without stool or gas in ostomy bag Skin: warm and dry, no rashes    Lab Results:  Recent Labs    12/29/18 0400 12/30/18 0319  WBC 5.3 6.8  HGB 9.5* 9.3*  HCT 31.1* 30.0*  PLT 141* 128*   BMET Recent Labs    12/30/18 0319 12/31/18 0337  NA 152* 151*  K 3.2* 3.1*  CL 109 108  CO2 31 35*  GLUCOSE 104* 258*  BUN 46* 36*  CREATININE 1.10* 0.85  CALCIUM 7.4* 7.7*   PT/INR No results for input(s): LABPROT, INR in the last 72 hours. CMP     Component Value Date/Time   NA 151 (H) 12/31/2018 0337   K 3.1 (L) 12/31/2018 0337   CL 108 12/31/2018 0337   CO2 35 (H) 12/31/2018 0337   GLUCOSE 258 (H) 12/31/2018 0337   BUN 36 (H) 12/31/2018 0337   CREATININE 0.85 12/31/2018 0337   CREATININE 1.04 (H) 12/18/2018 1029   CALCIUM 7.7 (L) 12/31/2018 0337   PROT 4.7 (L) 12/31/2018 0337   ALBUMIN 1.5 (L) 12/31/2018  0337   AST 20 12/31/2018 0337   AST 60 (H) 12/18/2018 1029   ALT 20 12/31/2018 0337   ALT 117 (H) 12/18/2018 1029   ALKPHOS 84 12/31/2018 0337   BILITOT 0.6 12/31/2018 0337   BILITOT 0.5 12/18/2018 1029   GFRNONAA >60 12/31/2018 0337   GFRNONAA 54 (L) 12/18/2018 1029   GFRAA >60 12/31/2018 0337   GFRAA >60 12/18/2018 1029   Lipase     Component Value Date/Time   LIPASE 24 12/25/2018 2254       Studies/Results: Dg Abd 1 View  Result Date: 12/31/2018 CLINICAL DATA:  73 year old female enteric tube placement. Postoperative day 4 distal large bowel resection due to obstructing tumor. EXAM: ABDOMEN - 1 VIEW COMPARISON:  12/26/2018. FINDINGS: AP supine view at 0120 hours. Enteric tube tip is at the level of the gastric body. The side hole is at the level of the gastric fundus. Resolved gas-filled transverse colon, but continued gas-filled small bowel loops in the lower abdomen and pelvis which are at the upper limits of normal to mildly dilated. New lower abdominal skin staples. Negative lung bases. No acute osseous abnormality identified. IMPRESSION: 1. Enteric tube in the stomach, side hole the level of the fundus. 2. Improved bowel gas pattern since 12/26/2018. Borderline to mildly dilated small bowel loops probably reflect postoperative ileus. Electronically Signed   By: Genevie Ann M.D.   On: 12/31/2018 02:38  Anti-infectives: Anti-infectives (From admission, onward)   Start     Dose/Rate Route Frequency Ordered Stop   12/29/18 1000  cefoTEtan (CEFOTAN) 2 g in sodium chloride 0.9 % 100 mL IVPB  Status:  Discontinued     2 g 200 mL/hr over 30 Minutes Intravenous Every 24 hours 12/28/18 1654 12/29/18 0945   12/29/18 1000  cefoTEtan (CEFOTAN) 2 g in sodium chloride 0.9 % 100 mL IVPB  Status:  Discontinued     2 g 200 mL/hr over 30 Minutes Intravenous Every 12 hours 12/29/18 0945 12/30/18 0738   12/27/18 2200  cefoTEtan (CEFOTAN) 2 g in sodium chloride 0.9 % 100 mL IVPB  Status:   Discontinued     2 g 200 mL/hr over 30 Minutes Intravenous Every 12 hours 12/27/18 1449 12/28/18 1654   12/27/18 1039  sodium chloride 0.9 % with cefoTEtan (CEFOTAN) ADS Med    Note to Pharmacy:  Chrystine Oiler: cabinet override      12/27/18 1039 12/27/18 1130   12/26/18 1230  cefoTEtan (CEFOTAN) 2 g in sodium chloride 0.9 % 100 mL IVPB     2 g 200 mL/hr over 30 Minutes Intravenous On call to O.R. 12/26/18 1224 12/27/18 0559       Assessment/Plan Recent admission for chest pain Atrial fibrillation on Eliquis-last dose 12/25/2018-more tachycardic and hypertensive today  Hypertension Acute on chronic kidney disease stage III- creatinine/BUNdowntrending, UOP adequate Type 2 diabetes- SSI Hx depression Anemia- appropriate response to transfusion, hgb 9.3 2/17 Platelets dropped down to 128 Hypokalemia3.1- replace in TPN Malnutrition- prealbumin 5.6 today   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 Hassell Done  -POD#4 - NGT output too high to clamp today - surgical pathology consistent with adenocarcinoma in proximal descending colon mass, sigmoid mass consistent with large villous adenoma - expected post-op ileus - continue TPN, MOBILIZE! - d/c PCA and put on q1h prn pushes for better pain control - patient not remembering to use PCA Liver metastasis - per oncology   FEN: NPO, IVF; TPN  ID: cefotetan 2/13>2/17 [id number] gtt Foley: present Follow up: Dr. Hassell Done   LOS: 5 days    Brigid Re , [facility] 12/31/2018, 7:31 AM Pager: [phone] Consults: [phone]

## 2018-12-31 NOTE — Progress Notes (Signed)
ANTICOAGULATION CONSULT NOTE - Follow Up Consult  Pharmacy Consult for heparin Indication: atrial fibrillation  Allergies  Allergen Reactions  . Metformin And Related Nausea And Vomiting  . Prednisone Nausea And Vomiting    Patient Measurements: Height: 5\' 3"  (160 cm) Weight: 181 lb (82.1 kg) IBW/kg (Calculated) : 52.4 Heparin Dosing Weight: 69.7 kg  Vital Signs: Temp: 98.3 F (36.8 C) (02/18 0412) Temp Source: Axillary (02/18 0412) BP: 176/77 (02/18 0646) Pulse Rate: 114 (02/18 0646)  Labs: Recent Labs    12/29/18 0400 12/29/18 2221  12/30/18 0319 12/30/18 1100 12/30/18 1800 12/31/18 0337  HGB 9.5*  --   --  9.3*  --   --   --   HCT 31.1*  --   --  30.0*  --   --   --   PLT 141*  --   --  128*  --   --   --   APTT  --  111*  --   --  97*  --   --   HEPARINUNFRC  --   --    < >  --  0.47 0.46 0.39  CREATININE 1.32*  --   --  1.10*  --   --  0.85   < > = values in this interval not displayed.    Estimated Creatinine Clearance: 60.7 mL/min (by C-G formula based on SCr of 0.85 mg/dL).   Medications:  Medications Prior to Admission  Medication Sig Dispense Refill Last Dose  . apixaban (ELIQUIS) 5 MG TABS tablet TAKE 1 TABLET (5 MG TOTAL) BY MOUTH 2 (TWO) TIMES DAILY. (Patient taking differently: Take 5 mg by mouth 2 (two) times daily. ) 180 tablet 2 12/25/2018 at 800pm  . diltiazem (CARDIZEM CD) 120 MG 24 hr capsule Take 1 capsule (120 mg total) by mouth 2 (two) times daily. 180 capsule 3 12/25/2018 at Unknown time  . furosemide (LASIX) 20 MG tablet Lasix 20 mg po daily PRN for weight of greater than 3 lbs in a day or 5 lbs in a week 30 tablet 11 Past Week at Unknown time  . HYDROcodone-acetaminophen (NORCO) 7.5-325 MG tablet Take 1 tablet by mouth 2 (two) times daily as needed (pain).    12/25/2018 at Unknown time  . insulin detemir (LEVEMIR) 100 UNIT/ML injection Inject 0.07 mLs (7 Units total) into the skin daily before breakfast. 10 mL 11 12/25/2018 at Unknown time  .  liraglutide (VICTOZA) 18 MG/3ML SOPN Inject 1.8 mg into the skin daily before breakfast.    12/25/2018 at Unknown time  . Multiple Vitamin (MULTIVITAMIN WITH MINERALS) TABS tablet Take 2 tablets by mouth daily at 12 noon.    Past Week at Unknown time  . oxybutynin (DITROPAN) 5 MG tablet Take 20 mg by mouth daily.   Past Week at Unknown time  . oxybutynin (DITROPAN-XL) 10 MG 24 hr tablet Take 10 mg by mouth daily.     . sertraline (ZOLOFT) 100 MG tablet Take 100 mg by mouth daily.    Past Week at Unknown time  . simvastatin (ZOCOR) 20 MG tablet Take 20 mg by mouth at bedtime.    12/25/2018 at Unknown time    Assessment: 72 yoF on apixaban 5 mg po BID PTA for afib.  S/p OR 2/14 for resection and colostomy d/t obstructive colonic mass in the setting of newly diagnosed colon cancer.  Pharmacy consulted to dose heparin for afib with no bolus, until reliable GI function  Significant events: 2/13 20:00 last  Eliquis dose PTA 2/16  MD aware of possible HIT but feels "more component acute blood loss anemia". 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 (weakly elevated)  Discussed borderline result with E-Link MD; my best estimated 4T score is </= 3 (recent surgery, no thrombosis; early platelet fall but did have multiple exposures to heparin flushes at [facility]) - per [facility] HIT algorithm, for 4T score </= 5, disregard OD of < 0.6 and continue heparin; CCM in agreement as long as CBC closely followed  Today, 12/31/18  Heparin level 0.39 remains stable and therapeutic on current rate of 850 units/hr  CBC:  Hgb 10.1 is improved/stable, Plts increased to 158.    See notes above regarding HIT Ab 0.434 (weakly elevated)  No bleeding or complications noted  Goal of Therapy: Heparin level 0.3-0.7 units/ml Monitor platelets by anticoagulation protocol: Yes  Plan:  Continue heparin IV infusion at 850 units/hr  Daily CBC and heparin level - f/u Plt trajectory closely  Monitor for signs of bleeding or  thrombosis  Follow up long-term anticoagulation plans.   Gretta Arab PharmD, BCPS Pager [phone] 12/31/2018 8:01 AM

## 2018-12-31 NOTE — Progress Notes (Signed)
Dr Ninfa Linden notiifed patient pulled NG tube out. States NG tube can be left out. Also notiied  That patient had to be lifted into chair today due to extreme weakness.

## 2018-12-31 NOTE — Progress Notes (Signed)
Pt was found in bed with NG tube pulled out. When asked what happened, the response was "I did it on purpose. I'm ready to go home." Pt was educated about importance of the NG tube. RN reinserted tube in right nare-currently waiting for KUB.

## 2018-12-31 NOTE — Progress Notes (Addendum)
NAME:  Cassandra Allen, MRN:  [id number], DOB:  July 16, 1946, LOS: 5 ADMISSION DATE:  12/25/2018, CONSULTATION DATE:  2/15 REFERRING MD:  Verlon Au - TR[facility], CHIEF COMPLAINT:  Hypotension, pressor needs    Brief History   73 year old woman with hx AFib on eliquis, CHF, newly dx metastatic colon ca 10/25/18 who underwent hemicolectomy for newly obstructing sigmoid cancer.  We were asked to see for persistent post-operative hypotension and uncontrolled Afib on 2/15 Intraoperative findings of ischemia of sigmoid colon, associated fetid smell, but no frank peritoneal soiling.   Admitted to ICU on diltiazem IV and phenylephrine 2/14.  Consults:  Cardiology   Procedures:  Ex lap with partial colectomy and transverse colostomy >> 2/14  Significant Diagnostic Tests:  CT abd pelvis 2/13>>> Changes consistent with an obstructing lesion in the proximal descending colon consistent with the known history of colon carcinoma. The proximal colon as well as the entire small bowel is dilated secondary to the obstructive change. Second intraluminal mass within the sigmoid colon consistent with prior colonoscopy findings. Stable left adrenal lesion shown to represent adenoma on recent MRI. Hepatic metastatic disease similar to the prior MRI. Cholelithiasis without complicating factors.  Micro Data:    Antimicrobials:     Interim history/subjective:  Complaining of significant abdominal pain.  Refusing intermittent feeds.  Objective   Blood pressure (Abnormal) 179/66, pulse (Abnormal) 108, temperature 98.3 F (36.8 C), temperature source Axillary, resp. rate (Abnormal) 31, height 5\' 3"  (1.6 m), weight 82.1 kg, SpO2 92 %.        Intake/Output Summary (Last 24 hours) at 12/31/2018 0906 Last data filed at 12/31/2018 0831 Gross per 24 hour  Intake 1315.15 ml  Output 3435 ml  Net -2119.85 ml   Filed Weights   12/30/18 0430 12/30/18 1446 12/31/18 0412  Weight: 82 kg 82 kg 82.1 kg     Examination: General: This is a frail 73 year old female lying in bed she is in no acute distress however does have significant abdominal discomfort HEENT: Normocephalic atraumatic mucous membranes moist nasogastric tube is at low intermittent suction Pulmonary: Diminished bases no accessory exp wheeze but predom upper airway, pulling very limited volumes on incentive spirometry Cardiac: Irregular irregular with atrial fibrillation actually. Abdomen: Distended, hypoactive bowel sounds, pain to palpation.  Stoma is dusky Extremities: Warm and dry brisk capillary refill Neuro: Awake and oriented no focal deficits  Resolved Hospital Problem list   Hypotension-multifactorial in setting of A. fib with RVR, diltiazem drip and?  Mild peritonitis resolved.  Off phenylephrine. Thrombocytopenia (normalzied 2/18)  Assessment & Plan:   Obstructing colon CA s/p exploratory lap 2/14 Liver metastasis Plan Cont NPO w/ NGT drainage rx pain Mobilize General Post-op IS  TPN  Wound care per surg   History A. fib-currently rate controlled Plan Cont tele Cont IV heparin in place of DOAC Cont IV digoxin Cont IV lopressor,  scheduled  wheezing Plan cxr today Cont IS Wean oxygen Increase PPI (? Reflux related)  History diastolic CHF; now hypertensive Plan Cont tele  Cont diuresis (re-assess daily) BP control w/ PRN hydralazine When able to take POs can resume cardiazem   Fluid and electrolyte imbalance: hypernatremia , hypokalemia, hypophosphatemia  Plan Will d/w pharmacy re: free water replacement in TPN vs D5W Replace K & phos Repeat am chem  Anemia w/out evidence of bleeding Plan Trend CBC  Type 2 diabetes w/ hyperglycemia Plan ssi  Will adjust basal coverage and change mod to resistant scale  Critical care will ask  internal medicine to reassume care.  We will sign off.    Erick Colace ACNP-BC [place] Pager # [phone] OR # [phone] if no  answer

## 2018-12-31 NOTE — Progress Notes (Addendum)
[facility] Progress Note Patient Name: Cassandra Allen DOB: 02/17/1946 MRN: [id number]   Date of Service  12/31/2018  HPI/Events of Note  Hypertension - BP = 182/75  eICU Interventions  Will order: 1. Hydralazine 10 mg IV Q 4 hours PRN SBP > 170 or DBP > 100.  2. Lasix 40 mg IV X 1.      Intervention Category Major Interventions: Hypertension - evaluation and management  Sommer,Steven Eugene 12/31/2018, 5:58 AM

## 2018-12-31 NOTE — Progress Notes (Signed)
[facility] NOTE   Pharmacy Consult for TPN Indication: prolonged ileus  Patient Measurements: Height: 5\' 3"  (160 cm) Weight: 181 lb (82.1 kg) IBW/kg (Calculated) : 52.4 TPN AdjBW (KG): 59.1 Body mass index is 32.06 kg/m.  Insulin Requirements:  Moderate SSI:  20 units (0 units required prior to TPN) Hx DM (PTA meds: Liraglutide 1.8 mg QDAC and Levemir 7 units QDAC)  Current Nutrition: NPO  IVF: d/c on 2/17 by MD.  Restarting D5w on 2/18 per MD for hyperNa  Central access: PAC TPN start date: 2/17  ASSESSMENT                                                                                                          HPI: 73 yo female s/p laparotomy with mobilization of splenic flexure and resection of the sigmoid, descending and distal transverse colon with hartman pouch and end colostomy on 2/14 due to metastatic colon cancer with total near obstruction. Patient now with NG decompression and expected post op ileus to start TPN per pharmacy this PM. She is at risk for refeeding syndrome given poor PO intakes for >1 week.  Significant events:    Today:   Glucose - CBGs stable (89-127) prior to start of TPN, now significantly increased after starting TPN.  Range 141-259 after TPN started at 18:00.  (Goal 100-150.)    D5w will also increase CBGs, but needed for hyperNa  Electrolytes - Na 151 remains elevated.  K (goal > 4) low/decreased to 3.1, Phos low/decreased to 2.  CO2 elevated, Cl WNL, Mag (goal >2) WNL, CorrCa 9.7 WNL.  Lasix x1 2/18  Renal - SCr 0.85, significantly improving  LFTs - WNL stable  TGs - 182 (2/16), 202 (2/18)  Prealbumin - <5 (2/17), 5.6 (2/18)  NUTRITIONAL GOALS                                                                                             RD recs (2/17):  Kcal:  1650-1850 Protein:  75-85g Fluid:  1.8L/day  TPN at a goal rate of 70 ml/hr:  50 g/L protein = 84 g / 24 hrs 16% Dextrose to  provide 914 kcal  30% Lipid concentration to provide 50 g, 504 kcal Total 1753 Kcal/day.  PLAN  Now: KPhos 15 mmol IV x1 dose (also provides 22 mEq K+) KCl 10 mEq IV x4 runs  At 1800 today:  Continue TPN at 50 ml/hr   Provides 60 g protein (80% of needs), and 1253 total Kcal (76% of needs)  Continue current rate and do not advance to goal d/t electrolyte and glucose derangements, risk for refeeding syndrome.    Per MD, OK to increase to goal rate from fluid standpoint when needed.  Electrolytes:  remove all Na, increase K, increase Phos.  Standard Mag and Ca.  Cl:Ac ratio = Max Cl  TPN to contain standard multivitamins and trace elements.  IVF per MD  (Per Dr. Verlon Au on 2/18, D5w at 50 ml/hr for total IVF rate 100 ml/hr while current TPN is running; at 18:00 decrease to 10 ml/hr when new TPN without Na is started)  Increase to resistant scale SSI Q4h.  Add regular insulin 10 units to TPN  TPN lab panels on Mondays & Thursdays.  BMET, Mag, and Phos with AM labs.  F/u daily.   Gretta Arab PharmD, BCPS Pager [phone] 12/31/2018 9:22 AM

## 2018-12-31 NOTE — Progress Notes (Signed)
[facility] PROGRESS NOTE  Cassandra Allen GGY:[id number] DOB: [birth year] DOA: 12/25/2018 PCP: [name], MD   Narrative:  73 year old Caucasian female Newly diagnosed colon cancer 10/25/2018 after colonoscopy with metastases started on FOLFOX every 2 weekly 12/04/2018 Iron deficiency anemia on iron sucrose Diabetes mellitus type 2 HTN CKD Atrial fibrillation diagnosed 04/2018 [name] score about 3 on anticoagulation  Presented to emergency room with mention to complaints and probable new obstructing mass in addition to AKI Baseline creatinine went from 1.3-2.39  General surgery consulted- and postoperatively took over the patient however patient has been in stepdown unit so we have been comanaging until she is hemodynamically and medically stable for floor transfer  Patient developed A. fib with RVR overnight  2/13 and was transferred to stepdown unit   A & Plan A. fib with RVR chads score >3 Continue IV digoxin changed from 0.125 every other day to daily 2/17 Prn Iv metoprolol added 2/17 2.5 q 4 prn Continue heparin GTT Peri-op Hypotension and anemia  D/c cardizem 2/15 Pressures improved with fluid repletion--off pressors since 24 h Replacing Calcium as well Hypernatremia and Hypokalemia  Changed LR to D5 with K 40 Meq infusions--see Pharm D notes  And also my note directly below N.p.o. state secondary to surgery On TPN--was on IV D5 with K--was stopped 2/18 am and she was givne lasix I have discussed with Pharm D who will transition to no sodium TPN and we will cut back D5 given patient NET + Micronutrient replacement as per TPN protocol Magnesium levels are conversely elevated-Prealbumin improving slowly Obstructive colonic mass in the setting of newly diagnosed colon cancer 10/25/2018 pathology =5 cm circumferential moderately differentiated adenocarcinoma [full report below] Defer transition to PO meds to general surgery Defer pain control to general  surgery TPN started per CCM on 2/15 Currently on Fentanyl PCA and dilaudid continue Cefotetan as per Gen surgery Oncologist Dr. Burr Medico aware Iron deficiency with acute blood loss anemia superimposed upon the same Hemoglobin dropped postsurgery to 7.6 transfused-see below HIT antibody +0.434 Mildly positive at this time D/w DR. Feng--given timing of Heparin starting at 2/16 and the drop prior to that, this is very unlikely HIT CBC shows conversely improvement of PLT count Diabetes mellitus type 2 Sugars 95-107 HTN with peri-op hypotension On digoxin now for HR--see above Acute superimposed on CKD stage II Baseline creatinine 1.3 on admission about 2.2 Saline lock IVF 2/18 Morbid obesity BMI 30    DVT heparin at this time code Status:Full Communication: called son 2/16.  Disposition Plan: Inpatient, SDU at this time monitoring HR and Pain needs--foresee will be in SDU over next 3-4 days   Verlon Au, MD  [facility] [address] [place] [email or web address] 7PM-7AM contact night coverage as above 12/31/2018, 7:45 AM  LOS: 5 days   Consultants:  General surgery  Cardiology  Procedures:  Status post resection laparotomy Hartmann pouch placement 12/27/2018  Antimicrobials:  Peri-operative at this time  Interval history/Subjective:  inmpain Not asking for meds states pain is 10/10 Not much OP from ostomy as yet  Objective:  Vitals:  Vitals:   12/31/18 0607 12/31/18 0646  BP: (!) 183/68 (!) 176/77  Pulse:  (!) 114  Resp:  (!) 22  Temp:    SpO2:  (!) 86%    Exam:  Coherent-pain 10/10 EOMI NCAT Abdomen obese distended pouch Post-op Ostomy bag has been changed No lower extremity edema S1-S2 tachycardic afib noted-mostly rate sin the 110-120 ranges Neurologically intact moving all 4 limbs incoherent  because of pain/anesthesia   I have personally reviewed the following:  DATA   Labs:  Sodium 147-->152-->151  BUN/creatinine  2.3-->129/3.0-->114/2.65-->66/1.32-->46/1.1-->36/0.8  Hemoglobin down from 10.1-7.6--->2 u prbc 2/14-->10.3-->9.5-->9.3->10.1  PLT 250-->141-->128-->158 now  Currently -1.7 liters for admit NG out 1.68  HIT Ab 0.43   Pathology report dated 2/14 shows  Invasive moderately differentiated adenocarcinoma 5 cm circumferentially involving proximal descending colon with associated luminal obstruction  Carcinoma involves and invades pericolonic soft tissue  Resection margins negative for carcinoma  Negative for lymphovascular perineural invasion  22 benign lymph nodes negative for carcinoma  Separate large villous adenoma involving sigmoid colon 5 cm without high-grade dysplasia or carcinoma  Nonspecific changes in proximal colon consistent with distal obstruction    Scheduled Meds: . chlorhexidine  15 mL Mouth Rinse BID  . Chlorhexidine Gluconate Cloth  6 each Topical Daily  . digoxin  0.125 mg Intravenous Daily  . insulin aspart  0-20 Units Subcutaneous Q4H  . insulin aspart  6 Units Subcutaneous Q4H  . lip balm  1 application Topical BID  . mouth rinse  15 mL Mouth Rinse q12n4p  . metoprolol tartrate  2.5 mg Intravenous Q6H  . pantoprazole (PROTONIX) IV  40 mg Intravenous Q12H  . sodium chloride flush  10-40 mL Intracatheter Q12H   Continuous Infusions: . acetaminophen Stopped (12/31/18 0839)  . dextrose 50 mL/hr at 12/31/18 1058  . heparin 850 Units/hr (12/31/18 0800)  . potassium chloride 10 mEq (12/31/18 1106)  . potassium PHOSPHATE IVPB (in mmol)    . TPN ADULT (ION) 50 mL/hr at 12/31/18 0800  . TPN ADULT (ION)      Principal Problem:   Colonic obstruction due to metastatic colon cancer Active Problems:   Essential (primary) hypertension   Diabetes mellitus without complication (HCC)   AKI (acute kidney injury) ([place])   Iron deficiency anemia   Cancer of splenic flexure of colon   Atrial fibrillation, chronic   Anticoagulant long-term use   Cancer of sigmoid  colon    History of adenomatous polyp of colon   Acute diastolic heart failure (HCC)   Liver metastases from liver cancer   CKD (chronic kidney disease) stage 3, GFR 30-59 ml/min (HCC)   Obesity (BMI 30-39.9)   Choledocholithiasis by MRCP   Status post partial colectomy Feb 2020   LOS: 5 days

## 2019-01-01 ENCOUNTER — Ambulatory Visit: Payer: Self-pay | Admitting: Hematology

## 2019-01-01 ENCOUNTER — Other Ambulatory Visit: Payer: Self-pay

## 2019-01-01 ENCOUNTER — Ambulatory Visit: Payer: Self-pay

## 2019-01-01 DIAGNOSIS — Z9049 Acquired absence of other specified parts of digestive tract: Secondary | ICD-10-CM

## 2019-01-01 DIAGNOSIS — I10 Essential (primary) hypertension: Secondary | ICD-10-CM

## 2019-01-01 DIAGNOSIS — C187 Malignant neoplasm of sigmoid colon: Principal | ICD-10-CM

## 2019-01-01 DIAGNOSIS — C185 Malignant neoplasm of splenic flexure: Secondary | ICD-10-CM

## 2019-01-01 DIAGNOSIS — I5031 Acute diastolic (congestive) heart failure: Secondary | ICD-10-CM

## 2019-01-01 LAB — URINALYSIS, MICROSCOPIC (REFLEX)
Bacteria, UA: NONE SEEN
Squamous Epithelial / HPF: NONE SEEN (ref 0–5)
WBC, UA: NONE SEEN WBC/hpf (ref 0–5)

## 2019-01-01 LAB — GLUCOSE, CAPILLARY
GLUCOSE-CAPILLARY: 193 mg/dL — AB (ref 70–99)
Glucose-Capillary: 171 mg/dL — ABNORMAL HIGH (ref 70–99)
Glucose-Capillary: 188 mg/dL — ABNORMAL HIGH (ref 70–99)
Glucose-Capillary: 193 mg/dL — ABNORMAL HIGH (ref 70–99)
Glucose-Capillary: 222 mg/dL — ABNORMAL HIGH (ref 70–99)
Glucose-Capillary: 245 mg/dL — ABNORMAL HIGH (ref 70–99)

## 2019-01-01 LAB — BASIC METABOLIC PANEL
Anion gap: 8 (ref 5–15)
BUN: 31 mg/dL — ABNORMAL HIGH (ref 8–23)
CALCIUM: 7.8 mg/dL — AB (ref 8.9–10.3)
CO2: 33 mmol/L — AB (ref 22–32)
Chloride: 105 mmol/L (ref 98–111)
Creatinine, Ser: 0.68 mg/dL (ref 0.44–1.00)
GFR calc Af Amer: >60 mL/min (ref >60)
GLUCOSE: 214 mg/dL — AB (ref 70–99)
Potassium: 4.1 mmol/L (ref 3.5–5.1)
Sodium: 146 mmol/L — ABNORMAL HIGH (ref 135–145)

## 2019-01-01 LAB — URINALYSIS, ROUTINE W REFLEX MICROSCOPIC
Bilirubin Urine: NEGATIVE
Glucose, UA: NEGATIVE mg/dL
Ketones, ur: NEGATIVE mg/dL
Leukocytes,Ua: NEGATIVE
Nitrite: NEGATIVE
Protein, ur: NEGATIVE mg/dL
Specific Gravity, Urine: 1.01 (ref 1.005–1.030)
pH: 5.5 (ref 5.0–8.0)

## 2019-01-01 LAB — CBC
HCT: 33.7 % — ABNORMAL LOW (ref 36.0–46.0)
Hemoglobin: 10.1 g/dL — ABNORMAL LOW (ref 12.0–15.0)
MCH: 29.6 pg (ref 26.0–34.0)
MCHC: 30 g/dL (ref 30.0–36.0)
MCV: 98.8 fL (ref 80.0–100.0)
Platelets: 133 10*3/uL — ABNORMAL LOW (ref 150–400)
RBC: 3.41 MIL/uL — ABNORMAL LOW (ref 3.87–5.11)
RDW: 16.4 % — ABNORMAL HIGH (ref 11.5–15.5)
WBC: 12.3 10*3/uL — ABNORMAL HIGH (ref 4.0–10.5)
nRBC: 0.4 % — ABNORMAL HIGH (ref 0.0–0.2)

## 2019-01-01 LAB — HEPARIN LEVEL (UNFRACTIONATED): Heparin Unfractionated: 0.31 IU/mL (ref 0.30–0.70)

## 2019-01-01 LAB — PHOSPHORUS: Phosphorus: 2.3 mg/dL — ABNORMAL LOW (ref 2.5–4.6)

## 2019-01-01 LAB — MAGNESIUM: Magnesium: 2 mg/dL (ref 1.7–2.4)

## 2019-01-01 MED ORDER — FUROSEMIDE 10 MG/ML IJ SOLN
40.0000 mg | Freq: Two times a day (BID) | INTRAMUSCULAR | Status: DC
  Administered 2019-01-01 – 2019-01-02 (×4): 40 mg via INTRAVENOUS
  Filled 2019-01-01 (×4): qty 4

## 2019-01-01 MED ORDER — ACETAMINOPHEN 325 MG PO TABS
650.0000 mg | ORAL_TABLET | Freq: Four times a day (QID) | ORAL | Status: DC
  Administered 2019-01-01 – 2019-01-10 (×37): 650 mg via ORAL
  Filled 2019-01-01 (×37): qty 2

## 2019-01-01 MED ORDER — OXYCODONE HCL 5 MG PO TABS
5.0000 mg | ORAL_TABLET | ORAL | Status: DC | PRN
  Administered 2019-01-02: 5 mg via ORAL
  Administered 2019-01-03 (×2): 10 mg via ORAL
  Administered 2019-01-03: 5 mg via ORAL
  Administered 2019-01-04: 10 mg via ORAL
  Administered 2019-01-06 – 2019-01-08 (×4): 5 mg via ORAL
  Administered 2019-01-09 – 2019-01-10 (×2): 10 mg via ORAL
  Filled 2019-01-01: qty 2
  Filled 2019-01-01 (×2): qty 1
  Filled 2019-01-01: qty 2
  Filled 2019-01-01 (×3): qty 1
  Filled 2019-01-01: qty 2
  Filled 2019-01-01: qty 1
  Filled 2019-01-01: qty 2
  Filled 2019-01-01: qty 1
  Filled 2019-01-01: qty 2

## 2019-01-01 MED ORDER — HYDRALAZINE HCL 20 MG/ML IJ SOLN: 10.0000 mg | INTRAMUSCULAR | Status: DC | PRN

## 2019-01-01 MED ORDER — TRAVASOL 10 % IV SOLN
INTRAVENOUS | Status: AC
  Administered 2019-01-01: 19:00:00 via INTRAVENOUS
  Filled 2019-01-01: qty 600

## 2019-01-01 MED ORDER — DEXTROSE 5 % IV SOLN
INTRAVENOUS | Status: AC
  Administered 2019-01-01: 09:00:00 via INTRAVENOUS

## 2019-01-01 MED ORDER — METOPROLOL TARTRATE 5 MG/5ML IV SOLN
5.0000 mg | Freq: Four times a day (QID) | INTRAVENOUS | Status: DC
  Administered 2019-01-01 – 2019-01-02 (×4): 5 mg via INTRAVENOUS
  Filled 2019-01-01 (×4): qty 5

## 2019-01-01 NOTE — Progress Notes (Addendum)
[facility] Progress Note  5 Days Post-Op  Subjective: CC: pain Pain still present but improved from yesterday. Patient denies nausea, NGT pulled out last night. Patient also removed 2 IVs and line from port last night. Requests that I take her mittens off.   Objective: Vital signs in last 24 hours: Temp:  [98 F (36.7 C)-98.5 F (36.9 C)] 98.5 F (36.9 C) (02/19 0800) Pulse Rate:  [75-105] 95 (02/19 0600) Resp:  [17-38] 32 (02/19 0600) BP: (136-185)/(64-102) 165/72 (02/19 0600) SpO2:  [96 %-100 %] 97 % (02/19 0600) Weight:  [81.2 kg] 81.2 kg (02/19 0500) Last BM Date: 12/30/18  Intake/Output from previous day: 02/18 0701 - 02/19 0700 In: 2550.8 [P.O.:180; I.V.:1488.2; IV Piggyback:882.6] Out: 2150 [Urine:2050; Stool:100] Intake/Output this shift: No intake/output data recorded.  PE: Gen:  Alert, NAD Card:  Irregularly irregular, HR in the 110-120 range Pulm:  4L O2 via [place] Abd: Soft, appropriately tender, non-distended, stool leaking from stoma onto midline incision - staples removed from middle portion and wound opened/packed here; stoma dark and retracted but working Skin: warm and dry, no rashes    Lab Results:  Recent Labs    12/31/18 0750 01/01/19 0447  WBC 9.2 12.3*  HGB 10.1* 10.1*  HCT 32.5* 33.7*  PLT 158 133*   BMET Recent Labs    12/31/18 0337 01/01/19 0447  NA 151* 146*  K 3.1* 4.1  CL 108 105  CO2 35* 33*  GLUCOSE 258* 214*  BUN 36* 31*  CREATININE 0.85 0.68  CALCIUM 7.7* 7.8*   PT/INR No results for input(s): LABPROT, INR in the last 72 hours. CMP     Component Value Date/Time   NA 146 (H) 01/01/2019 0447   K 4.1 01/01/2019 0447   CL 105 01/01/2019 0447   CO2 33 (H) 01/01/2019 0447   GLUCOSE 214 (H) 01/01/2019 0447   BUN 31 (H) 01/01/2019 0447   CREATININE 0.68 01/01/2019 0447   CREATININE 1.04 (H) 12/18/2018 1029   CALCIUM 7.8 (L) 01/01/2019 0447   PROT 4.7 (L) 12/31/2018 0337   ALBUMIN 1.5 (L) 12/31/2018 0337   AST 20 12/31/2018 0337   AST 60 (H) 12/18/2018 1029   ALT 20 12/31/2018 0337   ALT 117 (H) 12/18/2018 1029   ALKPHOS 84 12/31/2018 0337   BILITOT 0.6 12/31/2018 0337   BILITOT 0.5 12/18/2018 1029   GFRNONAA >60 01/01/2019 0447   GFRNONAA 54 (L) 12/18/2018 1029   GFRAA >60 01/01/2019 0447   GFRAA >60 12/18/2018 1029   Lipase     Component Value Date/Time   LIPASE 24 12/25/2018 2254       Studies/Results: Dg Abd 1 View  Result Date: 12/31/2018 CLINICAL DATA:  73 year old female enteric tube placement. Postoperative day 4 distal large bowel resection due to obstructing tumor. EXAM: ABDOMEN - 1 VIEW COMPARISON:  12/26/2018. FINDINGS: AP supine view at 0120 hours. Enteric tube tip is at the level of the gastric body. The side hole is at the level of the gastric fundus. Resolved gas-filled transverse colon, but continued gas-filled small bowel loops in the lower abdomen and pelvis which are at the upper limits of normal to mildly dilated. New lower abdominal skin staples. Negative lung bases. No acute osseous abnormality identified. IMPRESSION: 1. Enteric tube in the stomach, side hole the level of the fundus. 2. Improved bowel gas pattern since 12/26/2018. Borderline to mildly dilated small bowel loops probably reflect postoperative ileus. Electronically Signed   By: Herminio Heads.D.  On: 12/31/2018 02:38   Dg Chest Port 1 View  Result Date: 12/31/2018 CLINICAL DATA:  wheezing EXAM: PORTABLE CHEST 1 VIEW COMPARISON:  12/21/2018 FINDINGS: Patient has RIGHT-sided PowerPort, tip to the superior vena cava. Nasogastric tube has been placed, tip overlying the level of the proximal stomach. The heart is enlarged. There is pulmonary vascular congestion but no alveolar edema. No consolidations. IMPRESSION: Cardiomegaly and vascular congestion. Electronically Signed   By: Nolon Nations M.D.   On: 12/31/2018 13:01    Anti-infectives: Anti-infectives (From admission, onward)   Start     Dose/Rate  Route Frequency Ordered Stop   12/29/18 1000  cefoTEtan (CEFOTAN) 2 g in sodium chloride 0.9 % 100 mL IVPB  Status:  Discontinued     2 g 200 mL/hr over 30 Minutes Intravenous Every 24 hours 12/28/18 1654 12/29/18 0945   12/29/18 1000  cefoTEtan (CEFOTAN) 2 g in sodium chloride 0.9 % 100 mL IVPB  Status:  Discontinued     2 g 200 mL/hr over 30 Minutes Intravenous Every 12 hours 12/29/18 0945 12/30/18 0738   12/27/18 2200  cefoTEtan (CEFOTAN) 2 g in sodium chloride 0.9 % 100 mL IVPB  Status:  Discontinued     2 g 200 mL/hr over 30 Minutes Intravenous Every 12 hours 12/27/18 1449 12/28/18 1654   12/27/18 1039  sodium chloride 0.9 % with cefoTEtan (CEFOTAN) ADS Med    Note to Pharmacy:  Chrystine Oiler: cabinet override      12/27/18 1039 12/27/18 1130   12/26/18 1230  cefoTEtan (CEFOTAN) 2 g in sodium chloride 0.9 % 100 mL IVPB     2 g 200 mL/hr over 30 Minutes Intravenous On call to O.R. 12/26/18 1224 12/27/18 0559       Assessment/Plan Recent admission for chest pain Atrial fibrillation on Eliquis-last dose 12/25/2018-more tachycardic and hypertensive today  Hypertension Acute on chronic kidney disease stage III- creatinine/BUNdowntrending, UOP adequate Type 2 diabetes- SSI Hx depression Anemia- appropriate response to transfusion, hgb 10.1 Platelets 133 today Hypokalemia4.1, improving Malnutrition- prealbumin5.6 today   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 Hassell Done -POD#5 - NGT removed overnight and patient with stool output today - advance to CLD -surgical pathology consistent with adenocarcinoma in proximal descending colon mass, sigmoid mass consistent with large villous adenoma - continue TPN, MOBILIZE! - BID dressing changes to midline wound, if leaking from ostomy appliance again change Liver metastasis- per oncology  FEN:CLD,  IVF; TPN to resume tonight ID: cefotetan 2/13>2/17; WBC up to 12 will check UA, repeat tomorrow AM TYO:MAYOKHT gtt Foley: present Follow up: Dr. Hassell Done   LOS: 6 days    Brigid Re , [facility] 01/01/2019, 9:22 AM Pager: [phone] Consults: [phone]

## 2019-01-01 NOTE — Consult Note (Signed)
   [facility] Inpatient Consult   01/01/2019  Taneal Sonntag [facility] June 17, 1946 [id number]     Patient screened for potential [facility] Care Management services due to unplanned readmission risk score of 34% (extreme) and hospital readmission.  Chart reviewed. Ms. Austell is currently in ICU. It appears most recent therapy recommendations are for SNF. Will continue to follow along and engage for potential [facility] Care Management if appropriate.   Marthenia Rolling, MSN-Ed, RN,BSN [facility] Liaison [phone]

## 2019-01-01 NOTE — Progress Notes (Signed)
PROGRESS NOTE  Cassandra Allen HEN:[id number] DOB: 03-07-1946 DOA: 12/25/2018 PCP: [name], MD   LOS: 6 days   Brief Narrative / Interim history: 73 year old female with history of newly diagnosed colon cancer on 10/25/2018 with metastasis started on FOLFOX biweekly on 12/04/2018, iron deficiency anemia, DM 2, hypertension, CKD and atrial fibrillation on anticoagulation who presented with abdominal pain and found to have obstructing colon mass.  Patient had laparotomy with resection of sigmoid colon, descending colon and distal transverse colon, and colostomy.  Pathology showed moderately differentiated adenocarcinoma.  Hospitalization complicated by hypotension, anemia, and A. fib with RVR for which she was transferred to stepdown unit on 2/13.  Subjective: Some confusion and agitation per RN overnight.  Complains about back pain and abdominal pain.  Oriented x4- date of months.  Denies chest pain or dyspnea.   Assessment & Plan: Principal Problem:   Colonic obstruction due to metastatic colon cancer Active Problems:   Essential (primary) hypertension   Diabetes mellitus without complication (HCC)   AKI (acute kidney injury) ([place])   Iron deficiency anemia   Cancer of splenic flexure of colon   Atrial fibrillation, chronic   Anticoagulant long-term use   Cancer of sigmoid colon    History of adenomatous polyp of colon   Acute diastolic heart failure (HCC)   Liver metastases from liver cancer   CKD (chronic kidney disease) stage 3, GFR 30-59 ml/min (HCC)   Obesity (BMI 30-39.9)   Choledocholithiasis by MRCP   Status post partial colectomy Feb 2020  Obstructive colonic mass: Status post laparotomy, sigmoid, descending colon and distal transverse colon resection with colostomy on 12/27/2018.  Allergy showed invasive moderately differentiated adenocarcinoma.  -General surgery on board. -Defer pain management to general surgery. -Cefotetan 2/14 to 2/16. -TPN started on  2/15 -Oncology, Dr. Burr Medico on board-no plan for chemotherapy in the near future until she recovers.  Mild leukocytosis: new.  Patient is afebrile.  CXR without pneumonia.  Urinalysis negative. -Will consider CT abdomen to exclude intra-abdominal infection  A. fib with RVR: [name] vascular score greater than 3.  Heart rate in the range of 100-120s. -Increase metoprolol to 5 mg every 6 hours -Continue IV digoxin 0.125 mg daily. -Continue heparin GTT  Diastolic CHF exacerbation: Echo on 12/23/2018 with EF of 60 to 65% but no other significant structural functional abnormalities.  Patient with tachypnea.  Chest x-ray with cardiomegaly and pulmonary vascular congestion. -IV Lasix 40 mg twice daily -Daily weight, intake output and renal function. -Replenish electrolytes as appropriate.  Hypernatremia: Improved. -Continue D5 water at 50 cc/h.  Hypertension: Blood pressure remains elevated. 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 -Continue monitoring  Anemia secondary to GI bleed/chronic disease  DM2: CBG fairly controlled. -Continue SSI and CBG monitoring.  Normocytic anemia: Combination of blood loss and anemia of chronic disease -Continue monitoring.  Scheduled Meds: . acetaminophen  650 mg Oral Q6H  . chlorhexidine  15 mL Mouth Rinse BID  . Chlorhexidine Gluconate Cloth  6 each Topical Daily  . digoxin  0.125 mg Intravenous Daily  . furosemide  40 mg Intravenous BID  . insulin aspart  0-20 Units Subcutaneous Q4H  . lip balm  1 application Topical BID  . mouth rinse  15 mL Mouth Rinse q12n4p  . metoprolol tartrate  5 mg Intravenous Q6H  . pantoprazole (PROTONIX) IV  40 mg Intravenous Q12H  . sodium chloride flush  10-40 mL Intracatheter  Q12H   Continuous Infusions: . dextrose 50 mL/hr at 01/01/19 0924  . heparin 850 Units/hr (01/01/19 0900)  . TPN ADULT (ION)  Stopped (01/01/19 0724)  . TPN ADULT (ION)     PRN Meds:.fentaNYL (SUBLIMAZE) injection, hydrALAZINE, menthol-cetylpyridinium, ondansetron (ZOFRAN) IV, oxyCODONE, prochlorperazine, sodium chloride flush, sodium chloride flush  DVT prophylaxis: On heparin drip for atrial fibrillation Code Status: Full code Family Communication: None at bedside Disposition Plan: Remains inpatient.  Consultants:   General surgery  Oncology  Procedures:   Laparotomy, sigmoid, descending and distal transverse colon resection, Hartmann pouch  Antimicrobials:  Cefotetan 2/14 to 2/16  Objective: Vitals:   01/01/19 0600 01/01/19 0700 01/01/19 0800 01/01/19 0900  BP: (!) 165/72 (!) 178/67  (!) 167/71  Pulse: 95 (!) 113  77  Resp: (!) 32 (!) 33  (!) 30  Temp:   98.5 F (36.9 C)   TempSrc:   Oral   SpO2: 97% (!) 85%  100%  Weight:      Height:        Intake/Output Summary (Last 24 hours) at 01/01/2019 1208 Last data filed at 01/01/2019 0900 Gross per 24 hour  Intake 2328.17 ml  Output 1150 ml  Net 1178.17 ml   Filed Weights   12/30/18 1446 12/31/18 0412 01/01/19 0500  Weight: 82 kg 82.1 kg 81.2 kg    Examination:  GENERAL: Appears ill. EYES - vision grossly intact. Sclera anicteric.  NOSE- no gross deformity or drainage MOUTH - no oral lesions noted THROAT- no swelling or erythema LUNGS:  No IWOB.  Fair air movement bilaterally.  Rhonchi bilaterally. HEART:   Irregularly irregular.  Heart rate ranges from 110-120s ABD: Bowel sounds present. Soft.  Mild diffuse tenderness.  Colostomy bag with fecal content MSK/EXT: Moves extremities. No obvious deformity. SKIN: no apparent skin lesion.  NEURO: Awake, alert and oriented x4- date and months.  No gross deficit.  PSYCH: Calm. Normal affect.   Access: Lost access  Data Reviewed: I have independently reviewed following labs and imaging studies  CBC: Recent Labs  Lab 12/25/18 2254  12/28/18 0323 12/29/18 0400 12/30/18 0319  12/31/18 0750 01/01/19 0447  WBC 11.8*   < > 7.1 5.3 6.8 9.2 12.3*  NEUTROABS 10.3*  --  4.3 3.3 4.6  --   --   HGB 10.4*   < > 10.3* 9.5* 9.3* 10.1* 10.1*  HCT 33.2*   < > 33.7* 31.1* 30.0* 32.5* 33.7*  MCV 95.7   < > 96.0 96.0 96.8 97.3 98.8  PLT 309   < > 250 141* 128* 158 133*   < > = values in this interval not displayed.   Basic Metabolic Panel: Recent Labs  Lab 12/28/18 0323 12/28/18 0400 12/29/18 0400 12/30/18 0319 12/31/18 0337 01/01/19 0447  NA 142  --  147* 152* 151* 146*  K 3.3*  --  3.2* 3.2* 3.1* 4.1  CL 108  --  111 109 108 105  CO2 20*  --  26 31 35* 33*  GLUCOSE 156*  --  114* 104* 258* 214*  BUN 114*  --  66* 46* 36* 31*  CREATININE 2.65*  --  1.32* 1.10* 0.85 0.68  CALCIUM 5.9*  --  6.8* 7.4* 7.7* 7.8*  MG 3.6*  --  2.8* 2.7* 2.4 2.0  PHOS  --  7.4*  --  3.3 2.0* 2.3*   GFR: Estimated Creatinine Clearance: 64.1 mL/min (by C-G formula based on SCr of 0.68 mg/dL). Liver Function Tests: Recent Labs  Lab 12/25/18 2254 12/28/18 0323 12/30/18 0319 12/31/18 0337  AST 24 38 21 20  ALT 21 25 23 20   ALKPHOS 73 62 87 84  BILITOT 0.6 0.4 0.9 0.6  PROT 5.3* 4.5* 4.4* 4.7*  ALBUMIN 2.7* 1.9* 1.5* 1.5*   Recent Labs  Lab 12/25/18 2254  LIPASE 24   No results for input(s): AMMONIA in the last 168 hours. Coagulation Profile: Recent Labs  Lab 12/27/18 0325 12/28/18 0323  INR 2.14 1.95   Cardiac Enzymes: Recent Labs  Lab 12/27/18 0429  TROPONINI <0.03   BNP (last 3 results) No results for input(s): PROBNP in the last 8760 hours. HbA1C: No results for input(s): HGBA1C in the last 72 hours. CBG: Recent Labs  Lab 12/31/18 1613 12/31/18 1917 12/31/18 2307 01/01/19 0323 01/01/19 0739  GLUCAP 284* 231* 178* 193* 171*   Lipid Profile: Recent Labs    12/30/18 0319 12/31/18 0337  TRIG 182* 202*   Thyroid Function Tests: No results for input(s): TSH, T4TOTAL, FREET4, T3FREE, THYROIDAB in the last 72 hours. Anemia Panel: No results for  input(s): VITAMINB12, FOLATE, FERRITIN, TIBC, IRON, RETICCTPCT in the last 72 hours. Urine analysis:    Component Value Date/Time   COLORURINE YELLOW 01/01/2019 1103   APPEARANCEUR CLEAR 01/01/2019 1103   LABSPEC 1.010 01/01/2019 1103   PHURINE 5.5 01/01/2019 1103   GLUCOSEU NEGATIVE 01/01/2019 1103   HGBUR SMALL (A) 01/01/2019 1103   BILIRUBINUR NEGATIVE 01/01/2019 1103   KETONESUR NEGATIVE 01/01/2019 1103   PROTEINUR NEGATIVE 01/01/2019 1103   UROBILINOGEN 0.2 03/25/2015 2317   NITRITE NEGATIVE 01/01/2019 1103   LEUKOCYTESUR NEGATIVE 01/01/2019 1103   Sepsis Labs: Invalid input(s): PROCALCITONIN, LACTICIDVEN  Recent Results (from the past 240 hour(s))  Surgical pcr screen     Status: None   Collection Time: 12/26/18  8:53 PM  Result Value Ref Range Status   MRSA, PCR NEGATIVE NEGATIVE Final   Staphylococcus aureus NEGATIVE NEGATIVE Final    Comment: (NOTE) The Xpert SA Assay (FDA approved for NASAL specimens in patients 59 years of age and older), is one component of a comprehensive surveillance program. It is not intended to diagnose infection nor to guide or monitor treatment. Performed at [facility], [place] [address] [place], [place] [address]       Radiology Studies: No results found.   Gayathri Futrell T. [facility] Pager [phone]  If 7PM-7AM, please contact night-coverage [email or web address] Password [facility] 01/01/2019, 12:08 PM

## 2019-01-01 NOTE — Progress Notes (Signed)
ANTICOAGULATION CONSULT NOTE - Follow Up Consult  Pharmacy Consult for heparin Indication: atrial fibrillation  Allergies  Allergen Reactions  . Metformin And Related Nausea And Vomiting  . Prednisone Nausea And Vomiting    Patient Measurements: Height: 5\' 3"  (160 cm) Weight: 179 lb 0.2 oz (81.2 kg) IBW/kg (Calculated) : 52.4 Heparin Dosing Weight: 69.7 kg  Vital Signs: Temp: 98.5 F (36.9 C) (02/19 0800) Temp Source: Oral (02/19 0800) BP: 165/72 (02/19 0600) Pulse Rate: 95 (02/19 0600)  Labs: Recent Labs    12/29/18 2221  12/30/18 0319 12/30/18 1100 12/30/18 1800 12/31/18 0337 12/31/18 0750 01/01/19 0447  HGB  --    < > 9.3*  --   --   --  10.1* 10.1*  HCT  --   --  30.0*  --   --   --  32.5* 33.7*  PLT  --   --  128*  --   --   --  158 133*  APTT 111*  --   --  97*  --   --   --   --   HEPARINUNFRC  --    < >  --  0.47 0.46 0.39  --  0.31  CREATININE  --   --  1.10*  --   --  0.85  --  0.68   < > = values in this interval not displayed.    Estimated Creatinine Clearance: 64.1 mL/min (by C-G formula based on SCr of 0.68 mg/dL).   Medications:  Medications Prior to Admission  Medication Sig Dispense Refill Last Dose  . apixaban (ELIQUIS) 5 MG TABS tablet TAKE 1 TABLET (5 MG TOTAL) BY MOUTH 2 (TWO) TIMES DAILY. (Patient taking differently: Take 5 mg by mouth 2 (two) times daily. ) 180 tablet 2 12/25/2018 at 800pm  . diltiazem (CARDIZEM CD) 120 MG 24 hr capsule Take 1 capsule (120 mg total) by mouth 2 (two) times daily. 180 capsule 3 12/25/2018 at Unknown time  . furosemide (LASIX) 20 MG tablet Lasix 20 mg po daily PRN for weight of greater than 3 lbs in a day or 5 lbs in a week 30 tablet 11 Past Week at Unknown time  . HYDROcodone-acetaminophen (NORCO) 7.5-325 MG tablet Take 1 tablet by mouth 2 (two) times daily as needed (pain).    12/25/2018 at Unknown time  . insulin detemir (LEVEMIR) 100 UNIT/ML injection Inject 0.07 mLs (7 Units total) into the skin daily before  breakfast. 10 mL 11 12/25/2018 at Unknown time  . liraglutide (VICTOZA) 18 MG/3ML SOPN Inject 1.8 mg into the skin daily before breakfast.    12/25/2018 at Unknown time  . Multiple Vitamin (MULTIVITAMIN WITH MINERALS) TABS tablet Take 2 tablets by mouth daily at 12 noon.    Past Week at Unknown time  . oxybutynin (DITROPAN) 5 MG tablet Take 20 mg by mouth daily.   Past Week at Unknown time  . oxybutynin (DITROPAN-XL) 10 MG 24 hr tablet Take 10 mg by mouth daily.     . sertraline (ZOLOFT) 100 MG tablet Take 100 mg by mouth daily.    Past Week at Unknown time  . simvastatin (ZOCOR) 20 MG tablet Take 20 mg by mouth at bedtime.    12/25/2018 at Unknown time    Assessment: 72 yoF on apixaban 5 mg po BID PTA for afib.  S/p OR 2/14 for resection and colostomy d/t obstructive colonic mass in the setting of newly diagnosed colon cancer.  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   Adrian Saran, PharmD, BCPS Pager [phone] 01/01/2019 9:19 AM

## 2019-01-01 NOTE — Progress Notes (Signed)
[place] NOTE   Pharmacy Consult for TPN Indication: prolonged ileus  Patient Measurements: Height: 5\' 3"  (160 cm) Weight: 179 lb 0.2 oz (81.2 kg) IBW/kg (Calculated) : 52.4 TPN AdjBW (KG): 59.1 Body mass index is 31.71 kg/m.  Insulin Requirements:  Moderate SSI:  19 units required after start of new TPN last PM and 10 units regular insulin added to TPN bag as well (0 units required prior to TPN) Hx DM (PTA meds: Liraglutide 1.8 mg QDAC and Levemir 7 units QDAC)  Current Nutrition: NPO  IVF: Restarting D5W on 2/19 due to TPN having to be stopped secondary to lost IV access  Central access: PAC TPN start date: 2/17  ASSESSMENT                                                                                                          HPI: 73 yo female s/p laparotomy with mobilization of splenic flexure and resection of the sigmoid, descending and distal transverse colon with hartman pouch and end colostomy on 2/14 due to metastatic colon cancer with total near obstruction. Patient now with NG decompression and expected post op ileus to start TPN per pharmacy this PM. She is at risk for refeeding syndrome given poor PO intakes for >1 week.  Significant events:  2/19: 1) note that patient pulled out all IV access earlier this AM and TPN had to be stopped. IV access has been reestablised and D5W ordered to start at 50 ml/hr until TPN can be restarted this PM. 2) Per CCS, to start CL today but unsure how she will tolerate it at this point  Today:   Glucose - CBGs stable (89-127) prior to start of TPN, now significantly increased after starting TPN.  Insulin added to TPN last PM and CBGs appeared to be stabilizing prior to patient pulling out IV access - CBGs decreased from 231 to 178 to 193 to 171 after TPN started at 18:00.  (Goal 100-150.)    D5W will also increase CBGs, but needed for hyperNa  Electrolytes - Na 146 remains elevated but  improved.  K now WNL, Phos low but improved to 2.3.  CO2 elevated but improved, Cl WNL, Mag (goal >2) WNL, CorrCa 9.7 WNL.  Lasix 40mg  IV q12 started 2/19 AM  Renal - SCr 0.68, significantly improving  LFTs - WNL stable  TGs - 182 (2/16), 202 (2/18)  Prealbumin - <5 (2/17), 5.6 (2/18)  NUTRITIONAL GOALS                                                                                             RD recs (2/17):  Kcal:  2952-8413  Protein:  75-85g Fluid:  1.8L/day  TPN at a goal rate of 70 ml/hr:  50 g/L protein = 84 g / 24 hrs 16% Dextrose to provide 914 kcal  30% Lipid concentration to provide 50 g, 504 kcal Total 1753 Kcal/day.  PLAN                                                                                                                         At 1800 today:  Increase TPN from 50 ml/hr to 60 ml/hr  Provides 72 g protein (86% of needs), and 1503 total Kcal (86% of needs)  Per MD, OK to increase to goal rate from fluid standpoint when needed.  Electrolytes:  remove all Na, continue current K, increase Phos.  Standard Mag and Ca.  Cl:Ac ratio = Max Cl  TPN to contain standard multivitamins and trace elements.  Continue resistant scale SSI Q4h.  Increase regular insulin from 10 units in TPN to 15 units  TPN lab panels on Mondays & Thursdays.  BMET, Mag, and Phos with AM labs.  F/u daily.    Adrian Saran, PharmD, BCPS Pager [phone] 01/01/2019 9:23 AM

## 2019-01-01 NOTE — Progress Notes (Signed)
Physical Therapy Treatment Patient Details Name: Cassandra Allen MRN: [id number] DOB: 28-Nov-1945 Today's Date: 01/01/2019    History of Present Illness 73 year old woman with hx AFib on eliquis, CHF, DM, and newly dx metastatic colon ca 10/25/18 and currently status post exploratory laparotomy with partial colectomy and transverse colostomy on 12/27/18     PT Comments    Pt cooperative this am but continues ltd by abdominal pain, fatigue and significant generalized weakness.  This am, pt able to bear wt sufficiently to stand pvt with RW and assist to two bed to chair but unable to attempt ambulation further.  Lift pad in place for return to bed.   Follow Up Recommendations  SNF     Equipment Recommendations  None recommended by PT    Recommendations for Other Services       Precautions / Restrictions Precautions Precautions: Fall Precaution Comments: multiple lines/leads, colostomy,  Restrictions Weight Bearing Restrictions: No    Mobility  Bed Mobility Overal bed mobility: Needs Assistance Bed Mobility: Rolling;Sidelying to Sit Rolling: Mod assist;+2 for physical assistance;+2 for safety/equipment Sidelying to sit: Max assist;+2 for physical assistance;+2 for safety/equipment       General bed mobility comments: cues for technique and physical assist 2* pain/fatigue  Transfers Overall transfer level: Needs assistance Equipment used: Rolling walker (2 wheeled) Transfers: Sit to/from [employer] Transfers Sit to Stand: Max assist;+2 physical assistance;+2 safety/equipment;From elevated surface Stand pivot transfers: Max assist;+2 physical assistance;+2 safety/equipment;From elevated surface       General transfer comment: cues for sequence and use of UEs to self assist.  Physical assist to bring wt up and forward and to facilitate knee extension in standing  Ambulation/Gait             General Gait Details: Stand pvt only   Stairs              Wheelchair Mobility    Modified Rankin (Stroke Patients Only)       Balance Overall balance assessment: Needs assistance Sitting-balance support: Bilateral upper extremity supported;Feet supported Sitting balance-Leahy Scale: Poor     Standing balance support: Bilateral upper extremity supported;During functional activity Standing balance-Leahy Scale: Poor Standing balance comment: reliant on RW                            Cognition Arousal/Alertness: Awake/alert Behavior During Therapy: WFL for tasks assessed/performed Overall Cognitive Status: Within Functional Limits for tasks assessed                                 General Comments: Pt following cues and responding appropriately to questions but verbalizes minimally      Exercises      General Comments        Pertinent Vitals/Pain Pain Assessment: Faces Faces Pain Scale: Hurts even more Pain Location: abdomen with transition side ly to sit and sit to stand Pain Descriptors / Indicators: Aching;Sore;Grimacing;Guarding Pain Intervention(s): Limited activity within patient's tolerance;Monitored during session;Premedicated before session    Home Living                      Prior Function            PT Goals (current goals can now be found in the care plan section) Acute Rehab PT Goals Patient Stated Goal: No goals states PT Goal Formulation: Patient unable  to participate in goal setting Time For Goal Achievement: 01/13/19 Potential to Achieve Goals: Fair Progress towards PT goals: Progressing toward goals    Frequency    Min 2X/week      PT Plan Current plan remains appropriate    Co-evaluation              AM-PAC PT "6 Clicks" Mobility   Outcome Measure  Help needed turning from your back to your side while in a flat bed without using bedrails?: A Lot Help needed moving from lying on your back to sitting on the side of a flat bed without using  bedrails?: A Lot Help needed moving to and from a bed to a chair (including a wheelchair)?: A Lot Help needed standing up from a chair using your arms (e.g., wheelchair or bedside chair)?: A Lot Help needed to walk in hospital room?: Total Help needed climbing 3-5 steps with a railing? : Total 6 Click Score: 10    End of Session Equipment Utilized During Treatment: Gait belt;Oxygen Activity Tolerance: Patient limited by fatigue;Patient limited by pain Patient left: in chair;with call bell/phone within reach;with chair alarm set Nurse Communication: Mobility status PT Visit Diagnosis: Unsteadiness on feet (R26.81);Repeated falls (R29.6);Muscle weakness (generalized) (M62.81);Difficulty in walking, not elsewhere classified (R26.2);Pain     Time: 8676-1950 PT Time Calculation (min) (ACUTE ONLY): 22 min  Charges:  $Therapeutic Activity: 8-22 mins                     Debe Coder PT Acute Rehabilitation Services Pager [phone] Office [phone]     [facility] 01/01/2019, 12:31 PM

## 2019-01-02 DIAGNOSIS — E669 Obesity, unspecified: Secondary | ICD-10-CM

## 2019-01-02 DIAGNOSIS — Z86010 Personal history of colonic polyps: Secondary | ICD-10-CM

## 2019-01-02 DIAGNOSIS — N183 Chronic kidney disease, stage 3 (moderate): Secondary | ICD-10-CM

## 2019-01-02 LAB — CBC
HCT: 27.6 % — ABNORMAL LOW (ref 36.0–46.0)
HCT: 29.4 % — ABNORMAL LOW (ref 36.0–46.0)
Hemoglobin: 8.2 g/dL — ABNORMAL LOW (ref 12.0–15.0)
Hemoglobin: 8.7 g/dL — ABNORMAL LOW (ref 12.0–15.0)
MCH: 29.4 pg (ref 26.0–34.0)
MCH: 29.6 pg (ref 26.0–34.0)
MCHC: 29.7 g/dL — ABNORMAL LOW (ref 30.0–36.0)
MCHC: 29.6 g/dL — ABNORMAL LOW (ref 30.0–36.0)
MCV: 98.9 fL (ref 80.0–100.0)
MCV: 100 fL (ref 80.0–100.0)
Platelets: 127 10*3/uL — ABNORMAL LOW (ref 150–400)
Platelets: 134 10*3/uL — ABNORMAL LOW (ref 150–400)
RBC: 2.79 MIL/uL — ABNORMAL LOW (ref 3.87–5.11)
RBC: 2.94 MIL/uL — ABNORMAL LOW (ref 3.87–5.11)
RDW: 15.9 % — ABNORMAL HIGH (ref 11.5–15.5)
RDW: 15.8 % — ABNORMAL HIGH (ref 11.5–15.5)
WBC: 10 10*3/uL (ref 4.0–10.5)
WBC: 9.8 10*3/uL (ref 4.0–10.5)
nRBC: 0.4 % — ABNORMAL HIGH (ref 0.0–0.2)
nRBC: 0.3 % — ABNORMAL HIGH (ref 0.0–0.2)

## 2019-01-02 LAB — BASIC METABOLIC PANEL
Anion gap: 8 (ref 5–15)
BUN: 25 mg/dL — ABNORMAL HIGH (ref 8–23)
CO2: 34 mmol/L — ABNORMAL HIGH (ref 22–32)
Calcium: 7.4 mg/dL — ABNORMAL LOW (ref 8.9–10.3)
Chloride: 101 mmol/L (ref 98–111)
Creatinine, Ser: 0.61 mg/dL (ref 0.44–1.00)
GFR calc Af Amer: >60 mL/min (ref >60)
GFR calc non Af Amer: >60 mL/min (ref >60)
Glucose, Bld: 181 mg/dL — ABNORMAL HIGH (ref 70–99)
Potassium: 4 mmol/L (ref 3.5–5.1)
Sodium: 143 mmol/L (ref 135–145)

## 2019-01-02 LAB — ALBUMIN: Albumin: 1.7 g/dL — ABNORMAL LOW (ref 3.5–5.0)

## 2019-01-02 LAB — GLUCOSE, CAPILLARY
Glucose-Capillary: 158 mg/dL — ABNORMAL HIGH (ref 70–99)
Glucose-Capillary: 234 mg/dL — ABNORMAL HIGH (ref 70–99)
Glucose-Capillary: 270 mg/dL — ABNORMAL HIGH (ref 70–99)
Glucose-Capillary: 267 mg/dL — ABNORMAL HIGH (ref 70–99)
Glucose-Capillary: 278 mg/dL — ABNORMAL HIGH (ref 70–99)
Glucose-Capillary: 282 mg/dL — ABNORMAL HIGH (ref 70–99)

## 2019-01-02 LAB — HEPARIN LEVEL (UNFRACTIONATED)
Heparin Unfractionated: 0.16 IU/mL — ABNORMAL LOW (ref 0.30–0.70)
Heparin Unfractionated: 0.18 IU/mL — ABNORMAL LOW (ref 0.30–0.70)

## 2019-01-02 LAB — PHOSPHORUS: Phosphorus: 3.1 mg/dL (ref 2.5–4.6)

## 2019-01-02 LAB — MAGNESIUM: Magnesium: 1.4 mg/dL — ABNORMAL LOW (ref 1.7–2.4)

## 2019-01-02 MED ORDER — INSULIN ASPART 100 UNIT/ML ~~LOC~~ SOLN
0.0000 [IU] | Freq: Every day | SUBCUTANEOUS | Status: DC
  Administered 2019-01-02: 3 [IU] via SUBCUTANEOUS

## 2019-01-02 MED ORDER — DIGOXIN 125 MCG PO TABS
0.1250 mg | ORAL_TABLET | Freq: Every day | ORAL | Status: DC
  Administered 2019-01-02 – 2019-01-09 (×8): 0.125 mg via ORAL
  Filled 2019-01-02 (×8): qty 1

## 2019-01-02 MED ORDER — TRAVASOL 10 % IV SOLN
INTRAVENOUS | Status: DC
  Administered 2019-01-02: 18:00:00 via INTRAVENOUS
  Filled 2019-01-02: qty 600

## 2019-01-02 MED ORDER — PANTOPRAZOLE SODIUM 40 MG PO TBEC
40.0000 mg | DELAYED_RELEASE_TABLET | Freq: Every day | ORAL | Status: DC
  Administered 2019-01-02 – 2019-01-10 (×9): 40 mg via ORAL
  Filled 2019-01-02 (×9): qty 1

## 2019-01-02 MED ORDER — INSULIN ASPART 100 UNIT/ML ~~LOC~~ SOLN
0.0000 [IU] | Freq: Three times a day (TID) | SUBCUTANEOUS | Status: DC
  Administered 2019-01-02 (×2): 11 [IU] via SUBCUTANEOUS
  Administered 2019-01-03 (×2): 4 [IU] via SUBCUTANEOUS
  Administered 2019-01-03: 11 [IU] via SUBCUTANEOUS
  Administered 2019-01-06 – 2019-01-07 (×3): 3 [IU] via SUBCUTANEOUS
  Administered 2019-01-08: 4 [IU] via SUBCUTANEOUS
  Administered 2019-01-08: 3 [IU] via SUBCUTANEOUS
  Administered 2019-01-09: 4 [IU] via SUBCUTANEOUS
  Administered 2019-01-09 – 2019-01-10 (×3): 3 [IU] via SUBCUTANEOUS

## 2019-01-02 MED ORDER — ENSURE ENLIVE PO LIQD
237.0000 mL | Freq: Two times a day (BID) | ORAL | Status: DC
  Administered 2019-01-02 – 2019-01-04 (×4): 237 mL via ORAL

## 2019-01-02 MED ORDER — HEPARIN (PORCINE) 25000 UT/250ML-% IV SOLN
1150.0000 [IU]/h | INTRAVENOUS | Status: DC
  Administered 2019-01-03: 1150 [IU]/h via INTRAVENOUS
  Filled 2019-01-02: qty 250

## 2019-01-02 MED ORDER — METOPROLOL TARTRATE 25 MG PO TABS
25.0000 mg | ORAL_TABLET | Freq: Two times a day (BID) | ORAL | Status: DC
  Administered 2019-01-02 – 2019-01-09 (×16): 25 mg via ORAL
  Filled 2019-01-02 (×16): qty 1

## 2019-01-02 MED ORDER — HEPARIN (PORCINE) 25000 UT/250ML-% IV SOLN: 1000.0000 [IU]/h | INTRAVENOUS | Status: DC

## 2019-01-02 MED ORDER — MAGNESIUM SULFATE 2 GM/50ML IV SOLN
2.0000 g | Freq: Once | INTRAVENOUS | Status: AC
  Administered 2019-01-02: 2 g via INTRAVENOUS
  Filled 2019-01-02: qty 50

## 2019-01-02 NOTE — Progress Notes (Signed)
ANTICOAGULATION CONSULT NOTE - Follow Up Consult  Pharmacy Consult for heparin Indication: atrial fibrillation  Allergies  Allergen Reactions  . Metformin And Related Nausea And Vomiting  . Prednisone Nausea And Vomiting    Patient Measurements: Height: 5\' 3"  (160 cm) Weight: 177 lb 4 oz (80.4 kg) IBW/kg (Calculated) : 52.4 Heparin Dosing Weight: 69.7 kg  Vital Signs: Temp: 99 F (37.2 C) (02/20 0500) Temp Source: Oral (02/20 0500) BP: 156/87 (02/20 0500) Pulse Rate: 96 (02/20 0500)  Labs: Recent Labs    12/30/18 1100  12/31/18 0337  12/31/18 0750 01/01/19 0447 01/02/19 0518  HGB  --   --   --    < > 10.1* 10.1* 8.2*  HCT  --   --   --   --  32.5* 33.7* 27.6*  PLT  --   --   --   --  158 133* 127*  APTT 97*  --   --   --   --   --   --   HEPARINUNFRC 0.47   < > 0.39  --   --  0.31 0.16*  CREATININE  --   --  0.85  --   --  0.68 0.61   < > = values in this interval not displayed.    Estimated Creatinine Clearance: 63.8 mL/min (by C-G formula based on SCr of 0.61 mg/dL).   Medications:  Medications Prior to Admission  Medication Sig Dispense Refill Last Dose  . apixaban (ELIQUIS) 5 MG TABS tablet TAKE 1 TABLET (5 MG TOTAL) BY MOUTH 2 (TWO) TIMES DAILY. (Patient taking differently: Take 5 mg by mouth 2 (two) times daily. ) 180 tablet 2 12/25/2018 at 800pm  . diltiazem (CARDIZEM CD) 120 MG 24 hr capsule Take 1 capsule (120 mg total) by mouth 2 (two) times daily. 180 capsule 3 12/25/2018 at Unknown time  . furosemide (LASIX) 20 MG tablet Lasix 20 mg po daily PRN for weight of greater than 3 lbs in a day or 5 lbs in a week 30 tablet 11 Past Week at Unknown time  . HYDROcodone-acetaminophen (NORCO) 7.5-325 MG tablet Take 1 tablet by mouth 2 (two) times daily as needed (pain).    12/25/2018 at Unknown time  . insulin detemir (LEVEMIR) 100 UNIT/ML injection Inject 0.07 mLs (7 Units total) into the skin daily before breakfast. 10 mL 11 12/25/2018 at Unknown time  . liraglutide  (VICTOZA) 18 MG/3ML SOPN Inject 1.8 mg into the skin daily before breakfast.    12/25/2018 at Unknown time  . Multiple Vitamin (MULTIVITAMIN WITH MINERALS) TABS tablet Take 2 tablets by mouth daily at 12 noon.    Past Week at Unknown time  . oxybutynin (DITROPAN) 5 MG tablet Take 20 mg by mouth daily.   Past Week at Unknown time  . oxybutynin (DITROPAN-XL) 10 MG 24 hr tablet Take 10 mg by mouth daily.     . sertraline (ZOLOFT) 100 MG tablet Take 100 mg by mouth daily.    Past Week at Unknown time  . simvastatin (ZOCOR) 20 MG tablet Take 20 mg by mouth at bedtime.    12/25/2018 at Unknown time    Assessment: 72 yoF on apixaban 5 mg po BID PTA for afib.  S/p OR 2/14 for resection and colostomy d/t obstructive colonic mass in the setting of newly diagnosed colon cancer.  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  H/H=8.2/27.6, plts = 127- slight drop from 2/19  Goal of Therapy: Heparin level 0.3-0.7 units/ml Monitor platelets by anticoagulation protocol: Yes  Plan:  Increase heparin drip to 1000 units/hr  Daily CBC and heparin  level - f/u Plt trajectory closely  Monitor for signs of bleeding or thrombosis  Follow up long-term anticoagulation plans.    Dorrene German 01/02/2019 6:06 AM

## 2019-01-02 NOTE — Progress Notes (Signed)
6 Days Post-Op   Subjective/Chief Complaint: Comfortable Tolerating po   Objective: Vital signs in last 24 hours: Temp:  [98.5 F (36.9 C)-99 F (37.2 C)] 99 F (37.2 C) (02/20 0500) Pulse Rate:  [65-115] 80 (02/20 0600) Resp:  [18-31] 22 (02/20 0600) BP: (134-170)/(45-87) 147/68 (02/20 0600) SpO2:  [82 %-100 %] 96 % (02/20 0600) Weight:  [80.4 kg] 80.4 kg (02/20 0500) Last BM Date: 01/01/19  Intake/Output from previous day: 02/19 0701 - 02/20 0700 In: 1597.2 [P.O.:600; I.V.:997.2] Out: 3500 [Urine:2750; Stool:750] Intake/Output this shift: No intake/output data recorded.  Exam: Awake and alert Looks more comfortable Abdomen soft, ostomy productive but retracted.  Output controlled, Mild wound clean  Lab Results:  Recent Labs    01/01/19 0447 01/02/19 0518  WBC 12.3* 10.0  HGB 10.1* 8.2*  HCT 33.7* 27.6*  PLT 133* 127*   BMET Recent Labs    01/01/19 0447 01/02/19 0518  NA 146* 143  K 4.1 4.0  CL 105 101  CO2 33* 34*  GLUCOSE 214* 181*  BUN 31* 25*  CREATININE 0.68 0.61  CALCIUM 7.8* 7.4*   PT/INR No results for input(s): LABPROT, INR in the last 72 hours. ABG No results for input(s): PHART, HCO3 in the last 72 hours.  Invalid input(s): PCO2, PO2  Studies/Results: Dg Chest Port 1 View  Result Date: 12/31/2018 CLINICAL DATA:  wheezing EXAM: PORTABLE CHEST 1 VIEW COMPARISON:  12/21/2018 FINDINGS: Patient has RIGHT-sided PowerPort, tip to the superior vena cava. Nasogastric tube has been placed, tip overlying the level of the proximal stomach. The heart is enlarged. There is pulmonary vascular congestion but no alveolar edema. No consolidations. IMPRESSION: Cardiomegaly and vascular congestion. Electronically Signed   By: Nolon Nations M.D.   On: 12/31/2018 13:01    Anti-infectives: Anti-infectives (From admission, onward)   Start     Dose/Rate Route Frequency Ordered Stop   12/29/18 1000  cefoTEtan (CEFOTAN) 2 g in sodium chloride 0.9 % 100 mL  IVPB  Status:  Discontinued     2 g 200 mL/hr over 30 Minutes Intravenous Every 24 hours 12/28/18 1654 12/29/18 0945   12/29/18 1000  cefoTEtan (CEFOTAN) 2 g in sodium chloride 0.9 % 100 mL IVPB  Status:  Discontinued     2 g 200 mL/hr over 30 Minutes Intravenous Every 12 hours 12/29/18 0945 12/30/18 0738   12/27/18 2200  cefoTEtan (CEFOTAN) 2 g in sodium chloride 0.9 % 100 mL IVPB  Status:  Discontinued     2 g 200 mL/hr over 30 Minutes Intravenous Every 12 hours 12/27/18 1449 12/28/18 1654   12/27/18 1039  sodium chloride 0.9 % with cefoTEtan (CEFOTAN) ADS Med    Note to Pharmacy:  Chrystine Oiler: cabinet override      12/27/18 1039 12/27/18 1130   12/26/18 1230  cefoTEtan (CEFOTAN) 2 g in sodium chloride 0.9 % 100 mL IVPB     2 g 200 mL/hr over 30 Minutes Intravenous On call to O.R. 12/26/18 1224 12/27/18 0559      Assessment/Plan: s/p Procedure(s): LEFT SIGMOID COLECTOMY WITH HARTMANN POUCH AND END COLOSTOMY (N/A)  Advancing po Remains deconditioned WBC normal so can hold on CT Given overall medical condition, I think she needs to stay on the medical service  LOS: 7 days    Cassandra Allen 01/02/2019

## 2019-01-02 NOTE — Progress Notes (Signed)
Nutrition Follow-up  DOCUMENTATION CODES:   Obesity unspecified  INTERVENTION:  - Will order Ensure Enlive BID, each supplement provides 350 kcal and 20 grams of protein. - Continue to advance diet as medically feasible. - Continue to encourage PO intakes. - Continue TPN and weaning of TPN per Pharmacy and Surgery.    NUTRITION DIAGNOSIS:   Increased nutrient needs related to post-op healing, cancer and cancer related treatments as evidenced by estimated needs. -ongoing  GOAL:   Patient will meet greater than or equal to 90% of their needs -met with TPN and minimal PO intakes.   MONITOR:   PO intake, Supplement acceptance, Diet advancement, Weight trends, Labs, I & O's(TPN regimen)  ASSESSMENT:   73 year old woman with hx AFib on eliquis, CHF, newly dx metastatic colon ca 10/25/18 who underwent hemicolectomy for newly obstructing sigmoid cancer.   Per chart review, current weight consistent with admission weight. NGT removed 2/18. Diet advanced from NPO to CLD yesterday at 9:20 AM and from CLD to [place] today at 8:00 AM. No intakes documented yesterday or today.   Patient sleeping with no family/visitors at bedside. She was easily arousable and reports consuming grits, jello, and grape juice for breakfast without issue. She denies nausea but states that she is having abdominal pain and back pain which wraps around to her sides. She is unsure if PO intakes are causing any increase in pain.   Patient has implanted R chest port and is receiving custom TPN @ 50 ml/hr with plan to continue this rate today.     Medications reviewed; 40 mg IV lasix BID, sliding scale novolog, 2 g IV Mg sulfate x1 run 2/20. Labs reviewed; CBGs: 158 and 234 mg/dl today, BUN: 25 mg/dl, Ca: 7.4 mg/dl, Mg: 1.4 mg/dl.     Diet Order:   Diet Order            Diet full liquid Room service appropriate? Yes; Fluid consistency: Thin  Diet effective now              EDUCATION NEEDS:   Not appropriate  for education at this time  Skin:  Skin Assessment: Reviewed RN Assessment  Last BM:  2/19  Height:   Ht Readings from Last 1 Encounters:  12/25/18 _0  (1.6 m)    Weight:   Wt Readings from Last 1 Encounters:  01/02/19 80.4 kg    Ideal Body Weight:  52.3 kg  BMI:  Body mass index is 31.4 kg/m.  Estimated Nutritional Needs:   Kcal:  1650-1850  Protein:  75-85g  Fluid:  1.8L/day     Jarome Matin, MS, RD, LDN, CNSC Inpatient Clinical Dietitian Pager # [phone] After hours/weekend pager # [phone]

## 2019-01-02 NOTE — Progress Notes (Addendum)
[place] NOTE   Pharmacy Consult for TPN Indication: prolonged ileus  Patient Measurements: Height: 5\' 3"  (160 cm) Weight: 177 lb 4 oz (80.4 kg) IBW/kg (Calculated) : 52.4 TPN AdjBW (KG): 59.1 Body mass index is 31.4 kg/m.  Insulin Requirements:  resistant SSI:  25 units required after start of new TPN last PM and 10 units regular insulin added to TPN bag as well (0 units required prior to TPN) Hx DM (PTA meds: Liraglutide 1.8 mg QDAC and Levemir 7 units QDAC)  Current Nutrition: Clear Liquid diet - tolerating fairly well per RN charting  IVF: Restarting D5W on 2/19 due to TPN having to be stopped secondary to lost IV access  Central access: PAC TPN start date: 2/17  ASSESSMENT                                                                                                          HPI: 73 yo female s/p laparotomy with mobilization of splenic flexure and resection of the sigmoid, descending and distal transverse colon with hartman pouch and end colostomy on 2/14 due to metastatic colon cancer with total near obstruction. Patient now with NG decompression and expected post op ileus to start TPN per pharmacy this PM. She is at risk for refeeding syndrome given poor PO intakes for >1 week.  Significant events:  2/19: 1) note that patient pulled out all IV access earlier this AM and TPN had to be stopped. IV access has been reestablised and D5W ordered to start at 50 ml/hr until TPN can be restarted this PM. 2) Per CCS, to start CL today but unsure how she will tolerate it at this point 2/20:   Today:   Glucose - CBGs jumped around the last 24hr due to D5W hanging in place of TPN until 1800 when new TPN bag started; however CBGs still elevated after start of TPN - 245, 222, 158, 2345  Electrolytes - WNL except low mag which orders per Md have already been entered  Lasix 40mg  IV q12 started 2/19 AM  Renal - SCr 0.61, stable. With start of  IV lasix, pt had 3.5L of UOP last 24hr  LFTs - WNL stable  TGs - 182 (2/16), 202 (2/18)  Prealbumin - <5 (2/17), 5.6 (2/18)  NUTRITIONAL GOALS                                                                                             RD recs (2/17):  Kcal:  1650-1850 Protein:  75-85g Fluid:  1.8L/day  TPN at a goal rate of 70 ml/hr:  50 g/L protein = 84 g / 24 hrs 16% Dextrose  to provide 914 kcal  30% Lipid concentration to provide 50 g, 504 kcal Total 1753 Kcal/day.  PLAN                                                                                                                         At 1800 today:  Continue TPN at rate of 50 ml/hr especially since patient appears to be tolerating clear liquid diet and now also taking PO meds  Electrolytes in TPN formula:  remove all Na, continue current K and Phos and increase mag.  Standard Mag and Ca.  Cl:Ac ratio = Max Cl  TPN to contain standard multivitamins and trace elements.  Continue resistant scale SSI but will change to with meals and hs due to patient being on diet  Continue insulin in TPN at 15 units  TPN lab panels on Mondays & Thursdays.  BMET, Mag, and Phos with AM labs.  Await continued diet advancement per CCS and possibly discontinue TPN in next few days    Adrian Saran, PharmD, BCPS Pager [phone] 01/02/2019 9:28 AM

## 2019-01-02 NOTE — Progress Notes (Signed)
PROGRESS NOTE  LEXI CONATY RXV:[id number] DOB: 03/23/1946 DOA: 12/25/2018 PCP: [name], MD   LOS: 7 days   Brief Narrative / Interim history: 73 year old female with history of newly diagnosed colon cancer on 10/25/2018 with metastasis started on FOLFOX biweekly on 12/04/2018, iron deficiency anemia, DM 2, hypertension, CKD and atrial fibrillation on anticoagulation who presented with abdominal pain and found to have obstructing colon mass.  Patient had laparotomy with resection of sigmoid colon, descending colon and distal transverse colon, and colostomy.  Pathology showed moderately differentiated adenocarcinoma.  Hospitalization complicated by hypotension, anemia, and A. fib with RVR for which she was transferred to stepdown unit on 2/13.  Started on metoprolol IV and heparin.   Subjective: Significant improvement from yesterday.  Complains of back pain and belly pain.  No nausea or vomiting.  Rating clear liquids by mouth.  Denies chest pain or dyspnea.  Heart rate improved significantly.  Assessment & Plan: Principal Problem:   Colonic obstruction due to metastatic colon cancer Active Problems:   Essential (primary) hypertension   Diabetes mellitus without complication (HCC)   AKI (acute kidney injury) ([place])   Iron deficiency anemia   Cancer of splenic flexure of colon   Atrial fibrillation, chronic   Anticoagulant long-term use   Cancer of sigmoid colon    History of adenomatous polyp of colon   Acute diastolic heart failure (HCC)   Liver metastases from liver cancer   CKD (chronic kidney disease) stage 3, GFR 30-59 ml/min (HCC)   Obesity (BMI 30-39.9)   Choledocholithiasis by MRCP   Status post partial colectomy Feb 2020  Obstructive colonic mass: Status post laparotomy, sigmoid, descending colon and distal transverse colon resection with transverse colostomy on 12/27/2018.  Pathology showed invasive moderately differentiated adenocarcinoma.  Also concern about  liver metastasis. -General surgery on board. -Defer pain management to general surgery. -Cefotetan 2/13 to 2/16. -TPN started on 2/15 -Tolerating clear liquid diet -Oncology, Dr. Burr Medico on board-no plan for chemotherapy in the near future until she recovers. -We will discontinue Foley today -Incentive spirometry  Mild leukocytosis: Resolved.  Afebrile.  CXR without pneumonia.  Urinalysis negative. -We will hold off abdominal CT now leukocytosis has resolved.  A. fib with RVR: [name] vascular score 5.  Heart rate in 80s and 90s. -Transition to p.o. metoprolol 30 mg twice daily -Oral digoxin 0.125 mg daily -Continue heparin GTT-we will switch to Eliquis down the road.  Diastolic CHF exacerbation: Echo on 12/23/2018 with EF of 60 to 65% but no other significant structural functional abnormalities.  Patient with tachypnea.  Chest x-ray with cardiomegaly and pulmonary vascular congestion. -IV Lasix 40 mg twice daily-doing well with this -Daily weight, intake output and renal function. -Replenish electrolytes as appropriate.  Hypernatremia: Resolved. -Continue monitoring  Hypomagnesemia: due to diuretics? -Replace and recheck.  Hypertension: Improved. -Metoprolol and Lasix as above -PRN hydralazine  Mild thrombocytopenia: Unlikely HIT given timing also heparin-induced platelet antibody slightly positive. -Continue monitoring -May be able to transition to Eliquis now she is tolerating clear liquids by mouth  Anemia secondary to GI bleed/chronic disease: Hemoglobin dropped from 10-8 overnight.  No obvious source of bleeding. -Repeat CBC at noon.  If it continues to drop, we may have to hold anticoagulation.  DM2: CBG fairly controlled. -Getting insulin in TPN -Appreciate help by pharmacy   Scheduled Meds: . acetaminophen  650 mg Oral Q6H  . chlorhexidine  15 mL Mouth Rinse BID  . Chlorhexidine Gluconate Cloth  6 each Topical Daily  .  digoxin  0.125 mg Oral Daily  . feeding  supplement (ENSURE ENLIVE)  237 mL Oral BID BM  . furosemide  40 mg Intravenous BID  . insulin aspart  0-20 Units Subcutaneous TID WC  . insulin aspart  0-5 Units Subcutaneous QHS  . lip balm  1 application Topical BID  . mouth rinse  15 mL Mouth Rinse q12n4p  . metoprolol tartrate  25 mg Oral BID  . pantoprazole  40 mg Oral Daily  . sodium chloride flush  10-40 mL Intracatheter Q12H   Continuous Infusions: . heparin 1,000 Units/hr (01/02/19 0616)  . TPN ADULT (ION) 50 mL/hr at 01/02/19 1000  . TPN ADULT (ION)     PRN Meds:.fentaNYL (SUBLIMAZE) injection, hydrALAZINE, menthol-cetylpyridinium, ondansetron (ZOFRAN) IV, oxyCODONE, prochlorperazine, sodium chloride flush, sodium chloride flush  DVT prophylaxis: On heparin drip for atrial fibrillation Code Status: Full code Family Communication: None at bedside Disposition Plan: Remains inpatient.  Consultants:   General surgery  Oncology  Procedures:   Laparotomy, sigmoid, descending and distal transverse colon resection, Hartmann pouch  Antimicrobials:  Cefotetan 2/13 to 2/16  Objective: Vitals:   01/02/19 0600 01/02/19 0800 01/02/19 0900 01/02/19 1000  BP: (!) 147/68 (!) 158/87 117/62 140/61  Pulse: 80 91 95 99  Resp: (!) 22 (!) 22 18 (!) 22  Temp:  98.7 F (37.1 C)    TempSrc:  Oral    SpO2: 96% 100% 97% 97%  Weight:      Height:        Intake/Output Summary (Last 24 hours) at 01/02/2019 1041 Last data filed at 01/02/2019 1000 Gross per 24 hour  Intake 2208.71 ml  Output 3500 ml  Net -1291.29 ml   Filed Weights   12/31/18 0412 01/01/19 0500 01/02/19 0500  Weight: 82.1 kg 81.2 kg 80.4 kg    Examination:  GENERAL: Appears well. No acute distress.  HEENT: MMM.  Vision and Hearing grossly intact.  NECK: Supple.  No JVD.  LUNGS:  No IWOB.  Fair air movement bilaterally.  Rhonchi bilaterally. HEART:   Heart sounds normal.  Heart rate in the range of 80s to 90s ABD: Bowel sounds present.  Mild diffuse  tenderness.  Colostomy bag with fecal content.  No notable blood. EXT:   no edema bilaterally.  SKIN: no apparent skin lesion.  NEURO: Awake, alert and oriented appropriately.  No gross deficit.  PSYCH: Calm. Normal affect.   Data Reviewed: I have independently reviewed following labs and imaging studies  CBC: Recent Labs  Lab 12/28/18 0323 12/29/18 0400 12/30/18 0319 12/31/18 0750 01/01/19 0447 01/02/19 0518  WBC 7.1 5.3 6.8 9.2 12.3* 10.0  NEUTROABS 4.3 3.3 4.6  --   --   --   HGB 10.3* 9.5* 9.3* 10.1* 10.1* 8.2*  HCT 33.7* 31.1* 30.0* 32.5* 33.7* 27.6*  MCV 96.0 96.0 96.8 97.3 98.8 98.9  PLT 250 141* 128* 158 133* 825*   Basic Metabolic Panel: Recent Labs  Lab 12/28/18 0400 12/29/18 0400 12/30/18 0319 12/31/18 0337 01/01/19 0447 01/02/19 0518  NA  --  147* 152* 151* 146* 143  K  --  3.2* 3.2* 3.1* 4.1 4.0  CL  --  111 109 108 105 101  CO2  --  26 31 35* 33* 34*  GLUCOSE  --  114* 104* 258* 214* 181*  BUN  --  66* 46* 36* 31* 25*  CREATININE  --  1.32* 1.10* 0.85 0.68 0.61  CALCIUM  --  6.8* 7.4* 7.7* 7.8* 7.4*  MG  --  2.8* 2.7* 2.4 2.0 1.4*  PHOS 7.4*  --  3.3 2.0* 2.3* 3.1   GFR: Estimated Creatinine Clearance: 63.8 mL/min (by C-G formula based on SCr of 0.61 mg/dL). Liver Function Tests: Recent Labs  Lab 12/28/18 0323 12/30/18 0319 12/31/18 0337 01/02/19 0518  AST 38 21 20  --   ALT 25 23 20   --   ALKPHOS 62 87 84  --   BILITOT 0.4 0.9 0.6  --   PROT 4.5* 4.4* 4.7*  --   ALBUMIN 1.9* 1.5* 1.5* 1.7*   No results for input(s): LIPASE, AMYLASE in the last 168 hours. No results for input(s): AMMONIA in the last 168 hours. Coagulation Profile: Recent Labs  Lab 12/27/18 0325 12/28/18 0323  INR 2.14 1.95   Cardiac Enzymes: Recent Labs  Lab 12/27/18 0429  TROPONINI <0.03   BNP (last 3 results) No results for input(s): PROBNP in the last 8760 hours. HbA1C: No results for input(s): HGBA1C in the last 72 hours. CBG: Recent Labs  Lab  01/01/19 1654 01/01/19 2039 01/01/19 2358 01/02/19 0416 01/02/19 0815  GLUCAP 188* 245* 222* 158* 234*   Lipid Profile: Recent Labs    12/31/18 0337  TRIG 202*   Thyroid Function Tests: No results for input(s): TSH, T4TOTAL, FREET4, T3FREE, THYROIDAB in the last 72 hours. Anemia Panel: No results for input(s): VITAMINB12, FOLATE, FERRITIN, TIBC, IRON, RETICCTPCT in the last 72 hours. Urine analysis:    Component Value Date/Time   COLORURINE YELLOW 01/01/2019 1103   APPEARANCEUR CLEAR 01/01/2019 1103   LABSPEC 1.010 01/01/2019 1103   PHURINE 5.5 01/01/2019 1103   GLUCOSEU NEGATIVE 01/01/2019 1103   HGBUR SMALL (A) 01/01/2019 1103   BILIRUBINUR NEGATIVE 01/01/2019 1103   KETONESUR NEGATIVE 01/01/2019 1103   PROTEINUR NEGATIVE 01/01/2019 1103   UROBILINOGEN 0.2 03/25/2015 2317   NITRITE NEGATIVE 01/01/2019 1103   LEUKOCYTESUR NEGATIVE 01/01/2019 1103   Sepsis Labs: Invalid input(s): PROCALCITONIN, LACTICIDVEN  Recent Results (from the past 240 hour(s))  Surgical pcr screen     Status: None   Collection Time: 12/26/18  8:53 PM  Result Value Ref Range Status   MRSA, PCR NEGATIVE NEGATIVE Final   Staphylococcus aureus NEGATIVE NEGATIVE Final    Comment: (NOTE) The Xpert SA Assay (FDA approved for NASAL specimens in patients 65 years of age and older), is one component of a comprehensive surveillance program. It is not intended to diagnose infection nor to guide or monitor treatment. Performed at [facility], [place] [address], [place], [place] [address]   Culture, Urine     Status: Abnormal (Preliminary result)   Collection Time: 01/01/19 11:03 AM  Result Value Ref Range Status   Specimen Description   Final    URINE, CATHETERIZED Performed at [facility], [place] [address] [place], [place] [address]    Special Requests   Final    NONE Performed at [facility], [place] [address], [place], [place] [address]     Culture >=100,000 COLONIES/mL ENTEROCOCCUS FAECALIS (A)  Final   Report Status PENDING  Incomplete      Radiology Studies: No results found.  Zitlaly Malson T. [name] Pager [phone]  If 7PM-7AM, please contact night-coverage [email or web address] Password [facility] 01/02/2019, 10:41 AM

## 2019-01-02 NOTE — Consult Note (Signed)
[place] Nurse ostomy follow up Stoma type/location: RLQ end colostomy  Stomal assessment/size: FLush dark stoma  Soft brown stool in pouch Peristomal assessment: intact Treatment options for stomal/peristomal skin: barrier ring and 1 piece convex Output soft brown stool Ostomy pouching: 1pc.convex Education provided: Pouch change.  Patient confused.  Reviewed twice weekly pouch changes and emptying when 1/3 full Enrolled patient in [place] Start Discharge program: No WOC team will follow. Domenic Moras MSN, RN, FNP-BC CWON Wound, Ostomy, Continence Nurse Pager [phone]

## 2019-01-02 NOTE — Progress Notes (Signed)
ANTICOAGULATION CONSULT NOTE - Follow Up Consult  Pharmacy Consult for heparin Indication: atrial fibrillation  Allergies  Allergen Reactions  . Metformin And Related Nausea And Vomiting  . Prednisone Nausea And Vomiting    Patient Measurements: Height: 5\' 3"  (160 cm) Weight: 177 lb 4 oz (80.4 kg) IBW/kg (Calculated) : 52.4 Heparin Dosing Weight: 69.7 kg  Vital Signs: Temp: 98.8 F (37.1 C) (02/20 1200) Temp Source: Oral (02/20 1200) BP: 147/62 (02/20 1300) Pulse Rate: 77 (02/20 1300)  Labs: Recent Labs    12/31/18 0337  01/01/19 0447 01/02/19 0518 01/02/19 1212 01/02/19 1435  HGB  --    < > 10.1* 8.2* 8.7*  --   HCT  --    < > 33.7* 27.6* 29.4*  --   PLT  --    < > 133* 127* 134*  --   HEPARINUNFRC 0.39  --  0.31 0.16*  --  0.18*  CREATININE 0.85  --  0.68 0.61  --   --    < > = values in this interval not displayed.    Estimated Creatinine Clearance: 63.8 mL/min (by C-G formula based on SCr of 0.61 mg/dL).    Assessment: 72 yoF on apixaban 5 mg po BID PTA for afib.  S/p OR 2/14 for resection and colostomy d/t obstructive colonic mass in the setting of newly diagnosed colon cancer.  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 (weakly elevated)  Discussed borderline result with E-Link MD; my best estimated 4T score is </= 3 (recent surgery, no thrombosis; early platelet fall but did have multiple exposures to heparin flushes at [facility]) - per [facility] HIT algorithm, for 4T score </= 5, disregard OD of < 0.6 and continue heparin; CCM in agreement as long as CBC closely followed   2/19  Heparin level remains therapeutic on current rate of 850 units/hr  CBC:  Hgb stable, Plts dropped to 133 - monitor closely  See notes above regarding HIT Ab 0.434 (weakly  elevated)  No bleeding or complications noted   Today, 2/20  0518 HL = 0.16 below goal, no bleeding or IV interuptions per RN.  H/H=8.2- down from 10.1/27.6, plts = 127- slight drop from 2/19  1435 HL - 0.18 remains subtherapeutic after rate increased from 850 to 1000 units/hr, repeat Hg 8.7. stable from am Hg 8.2, yesterday Hg 10.1.  PLTC remains low at 134.  No bleeding reported.  Goal of Therapy: Heparin level 0.3-0.7 units/ml Monitor platelets by anticoagulation protocol: Yes  Plan:  Increase heparin drip to 1150 units/hr and check 8 hr HL  Daily CBC and heparin level   Monitor for signs of bleeding or thrombosis  Follow up resuming eliquis soon once taking POs well  Eudelia Bunch, Pharm.D [phone] 01/02/2019 3:27 PM

## 2019-01-03 DIAGNOSIS — N390 Urinary tract infection, site not specified: Secondary | ICD-10-CM

## 2019-01-03 DIAGNOSIS — B952 Enterococcus as the cause of diseases classified elsewhere: Secondary | ICD-10-CM

## 2019-01-03 LAB — CBC
HCT: 26.8 % — ABNORMAL LOW (ref 36.0–46.0)
Hemoglobin: 8.2 g/dL — ABNORMAL LOW (ref 12.0–15.0)
MCH: 29.9 pg (ref 26.0–34.0)
MCHC: 30.6 g/dL (ref 30.0–36.0)
MCV: 97.8 fL (ref 80.0–100.0)
Platelets: 136 10*3/uL — ABNORMAL LOW (ref 150–400)
RBC: 2.74 MIL/uL — ABNORMAL LOW (ref 3.87–5.11)
RDW: 15.7 % — ABNORMAL HIGH (ref 11.5–15.5)
WBC: 11.9 10*3/uL — ABNORMAL HIGH (ref 4.0–10.5)
nRBC: 0.2 % (ref 0.0–0.2)

## 2019-01-03 LAB — BASIC METABOLIC PANEL
Anion gap: 9 (ref 5–15)
BUN: 27 mg/dL — ABNORMAL HIGH (ref 8–23)
CALCIUM: 7.4 mg/dL — AB (ref 8.9–10.3)
CO2: 34 mmol/L — ABNORMAL HIGH (ref 22–32)
CREATININE: 0.74 mg/dL (ref 0.44–1.00)
Chloride: 96 mmol/L — ABNORMAL LOW (ref 98–111)
GFR calc Af Amer: >60 mL/min (ref >60)
GFR calc non Af Amer: >60 mL/min (ref >60)
Glucose, Bld: 290 mg/dL — ABNORMAL HIGH (ref 70–99)
Potassium: 4.5 mmol/L (ref 3.5–5.1)
SODIUM: 139 mmol/L (ref 135–145)

## 2019-01-03 LAB — GLUCOSE, CAPILLARY
Glucose-Capillary: 251 mg/dL — ABNORMAL HIGH (ref 70–99)
Glucose-Capillary: 172 mg/dL — ABNORMAL HIGH (ref 70–99)
Glucose-Capillary: 183 mg/dL — ABNORMAL HIGH (ref 70–99)
Glucose-Capillary: 107 mg/dL — ABNORMAL HIGH (ref 70–99)

## 2019-01-03 LAB — T4, FREE: Free T4: 0.93 ng/dL (ref 0.82–1.77)

## 2019-01-03 LAB — URINE CULTURE: Culture: >=100000 — AB

## 2019-01-03 LAB — MAGNESIUM: Magnesium: 1.7 mg/dL (ref 1.7–2.4)

## 2019-01-03 LAB — TSH: TSH: 1.894 u[IU]/mL (ref 0.350–4.500)

## 2019-01-03 LAB — HEPARIN LEVEL (UNFRACTIONATED): HEPARIN UNFRACTIONATED: 0.24 [IU]/mL — AB (ref 0.30–0.70)

## 2019-01-03 LAB — PHOSPHORUS: Phosphorus: 3.4 mg/dL (ref 2.5–4.6)

## 2019-01-03 MED ORDER — SERTRALINE HCL 100 MG PO TABS
100.0000 mg | ORAL_TABLET | Freq: Every day | ORAL | Status: DC
  Administered 2019-01-03 – 2019-01-10 (×8): 100 mg via ORAL
  Filled 2019-01-03 (×8): qty 1

## 2019-01-03 MED ORDER — FENTANYL CITRATE (PF) 100 MCG/2ML IJ SOLN
12.5000 ug | INTRAMUSCULAR | Status: DC | PRN
  Filled 2019-01-03: qty 2

## 2019-01-03 MED ORDER — PRO-STAT SUGAR FREE PO LIQD
30.0000 mL | Freq: Two times a day (BID) | ORAL | Status: DC
  Administered 2019-01-03 – 2019-01-06 (×7): 30 mL via ORAL
  Filled 2019-01-03 (×7): qty 30

## 2019-01-03 MED ORDER — HEPARIN (PORCINE) 25000 UT/250ML-% IV SOLN: 1300.0000 [IU]/h | INTRAVENOUS | Status: DC

## 2019-01-03 MED ORDER — APIXABAN 5 MG PO TABS
5.0000 mg | ORAL_TABLET | Freq: Two times a day (BID) | ORAL | Status: DC
  Administered 2019-01-03 – 2019-01-10 (×15): 5 mg via ORAL
  Filled 2019-01-03 (×15): qty 1

## 2019-01-03 MED ORDER — INSULIN GLARGINE 100 UNIT/ML ~~LOC~~ SOLN
10.0000 [IU] | Freq: Two times a day (BID) | SUBCUTANEOUS | Status: DC
  Administered 2019-01-03 – 2019-01-08 (×11): 10 [IU] via SUBCUTANEOUS
  Filled 2019-01-03 (×13): qty 0.1

## 2019-01-03 MED ORDER — METHOCARBAMOL 500 MG PO TABS
500.0000 mg | ORAL_TABLET | Freq: Three times a day (TID) | ORAL | Status: DC
  Administered 2019-01-03 – 2019-01-04 (×5): 500 mg via ORAL
  Filled 2019-01-03 (×5): qty 1

## 2019-01-03 MED ORDER — SODIUM CHLORIDE 0.9 % IV SOLN
1.5000 g | Freq: Four times a day (QID) | INTRAVENOUS | Status: DC
  Filled 2019-01-03 (×2): qty 1.5

## 2019-01-03 MED ORDER — FUROSEMIDE 10 MG/ML IJ SOLN
40.0000 mg | Freq: Every day | INTRAMUSCULAR | Status: DC
  Administered 2019-01-03: 40 mg via INTRAVENOUS
  Filled 2019-01-03: qty 4

## 2019-01-03 MED ORDER — MAGNESIUM SULFATE IN D5W 1-5 GM/100ML-% IV SOLN
1.0000 g | Freq: Once | INTRAVENOUS | Status: AC
  Administered 2019-01-03: 1 g via INTRAVENOUS
  Filled 2019-01-03: qty 100

## 2019-01-03 MED ORDER — OXYBUTYNIN CHLORIDE 5 MG PO TABS
20.0000 mg | ORAL_TABLET | Freq: Every day | ORAL | Status: DC
  Administered 2019-01-03 – 2019-01-10 (×8): 20 mg via ORAL
  Filled 2019-01-03 (×8): qty 4

## 2019-01-03 MED ORDER — AMOXICILLIN 250 MG PO CAPS
500.0000 mg | ORAL_CAPSULE | Freq: Three times a day (TID) | ORAL | Status: DC
  Administered 2019-01-03 – 2019-01-10 (×22): 500 mg via ORAL
  Filled 2019-01-03 (×6): qty 2
  Filled 2019-01-03: qty 1
  Filled 2019-01-03 (×9): qty 2
  Filled 2019-01-03: qty 1
  Filled 2019-01-03 (×6): qty 2

## 2019-01-03 NOTE — Progress Notes (Signed)
Pharmacy Antibiotic Note  Cassandra Allen is a 73 y.o. female admitted on 12/25/2018 with UTI.  Pharmacy has been consulted for Unasyn dosing.  Plan:  Unasyn 1.5g IV q6 for entercoccus UTI - final sensitivities still pending  Narrow to PO option once sensitivities return  Height: 5\' 3"  (160 cm) Weight: 177 lb 14.6 oz (80.7 kg) IBW/kg (Calculated) : 52.4  Temp (24hrs), Avg:98.7 F (37.1 C), Min:98.1 F (36.7 C), Max:99 F (37.2 C)  Recent Labs  Lab 12/30/18 0319 12/31/18 0337 12/31/18 0750 01/01/19 0447 01/02/19 0518 01/02/19 1212 01/03/19 0259  WBC 6.8  --  9.2 12.3* 10.0 9.8 11.9*  CREATININE 1.10* 0.85  --  0.68 0.61  --  0.74    Estimated Creatinine Clearance: 63.9 mL/min (by C-G formula based on SCr of 0.74 mg/dL).    Allergies  Allergen Reactions  . Metformin And Related Nausea And Vomiting  . Prednisone Nausea And Vomiting    Thank you for allowing pharmacy to be a part of this patient's care.  Kara Mead 01/03/2019 8:23 AM

## 2019-01-03 NOTE — Progress Notes (Signed)
PROGRESS NOTE  Cassandra Allen JQZ:[id number] DOB: 06/26/1946 DOA: 12/25/2018 PCP: [name], MD   LOS: 8 days   Brief Narrative / Interim history: 73 year old female with history of newly diagnosed colon cancer on 10/25/2018 with metastasis started on FOLFOX biweekly on 12/04/2018, iron deficiency anemia, DM 2, hypertension, CKD and atrial fibrillation on anticoagulation who presented with abdominal pain and found to have obstructing colon mass.  Patient had laparotomy with resection of sigmoid colon, descending colon and distal transverse colon, and colostomy.  Pathology showed moderately differentiated adenocarcinoma.  Hospitalization complicated by hypotension, anemia, and A. fib with RVR for which she was transferred to stepdown unit on 2/13.  Started on metoprolol IV and heparin.  Heart rate improved to 80s and 90s.  Transitioned to metoprolol 25 mg twice daily and Eliquis.  Subjective: No major events overnight or this morning.  Still with diffuse abdominal pain.  Denies chest pain, shortness of breath, nausea or vomiting.  Tolerating full liquid diet.  She has not gotten out of the bed.  Poor effort on incentive spirometry at bedside.  Oriented x4 except time.  Assessment & Plan: Principal Problem:   Colonic obstruction due to metastatic colon cancer Active Problems:   Essential (primary) hypertension   Diabetes mellitus without complication (HCC)   AKI (acute kidney injury) ([place])   Iron deficiency anemia   Cancer of splenic flexure of colon   Atrial fibrillation, chronic   Anticoagulant long-term use   Cancer of sigmoid colon    History of adenomatous polyp of colon   Acute diastolic heart failure (HCC)   Liver metastases from liver cancer   CKD (chronic kidney disease) stage 3, GFR 30-59 ml/min (HCC)   Obesity (BMI 30-39.9)   Choledocholithiasis by MRCP   Status post partial colectomy Feb 2020  Obstructive colonic mass: Status post laparotomy, sigmoid, descending  colon and distal transverse colon resection with transverse colostomy on 12/27/2018.  Pathology showed invasive moderately differentiated adenocarcinoma.  Also concern about liver metastasis. -General surgery on board. -Defer pain management to general surgery. -Cefotetan 2/13 to 2/16. -TPN started on 2/15 -Tolerating full liquid diet. -no plan for chemotherapy in the near future until she recovers per oncology, Dr. Burr Medico. -Out of bed x2 a day -Encourage incentive spirometry  Mild leukocytosis: Resolved.  Afebrile.  CXR without pneumonia.  Urinalysis negative. -We will hold off abdominal CT now leukocytosis has resolved.  A. fib with RVR: [name] vascular score 5.  Heart rate in 80s and 90s. -Continue metoprolol 30 mg twice daily -Oral digoxin 0.125 mg daily -Resume home Eliquis.  Diastolic CHF exacerbation: Echo on 12/23/2018 with EF of 60 to 65% but no other significant structural functional abnormalities.  Patient with tachypnea.  Chest x-ray with cardiomegaly and pulmonary vascular congestion.  Good urine output -We will back off to IV Lasix 40 mg daily.  Creatinine slightly up -Daily weight, intake output and renal function. -Replenish electrolytes as appropriate.  Hypernatremia: Resolved. -Continue monitoring  Hypomagnesemia: due to diuretics?  Improved. -Replace and recheck.  Hypertension: Improved. -Metoprolol and Lasix as above -PRN hydralazine  Mild thrombocytopenia: Stable.  Unlikely HIT given timing also heparin-induced platelet antibody slightly positive. -Continue monitoring -May be able to transition to Eliquis now she is tolerating clear liquids by mouth  Anemia secondary to GI bleed/chronic disease: Hemoglobin dropped from 10-8.2 remained stable at 8.2.  No obvious source of bleeding. -Repeat CBC at noon.  If it continues to drop, we may have to hold anticoagulation.  DM2:  CBG elevated. -Start Lantus 10 units twice daily -Continue SSI-high  Enterococcus  UTI/bacteriuria: sensitive to ampicillin. -Started amoxicillin 2/21--> -Discontinue Foley  Scheduled Meds: . acetaminophen  650 mg Oral Q6H  . amoxicillin  500 mg Oral Q8H  . apixaban  5 mg Oral BID  . chlorhexidine  15 mL Mouth Rinse BID  . Chlorhexidine Gluconate Cloth  6 each Topical Daily  . digoxin  0.125 mg Oral Daily  . feeding supplement (ENSURE ENLIVE)  237 mL Oral BID BM  . feeding supplement (PRO-STAT SUGAR FREE 64)  30 mL Oral BID  . furosemide  40 mg Intravenous Daily  . insulin aspart  0-20 Units Subcutaneous TID WC  . insulin aspart  0-5 Units Subcutaneous QHS  . lip balm  1 application Topical BID  . mouth rinse  15 mL Mouth Rinse q12n4p  . methocarbamol  500 mg Oral TID  . metoprolol tartrate  25 mg Oral BID  . oxybutynin  20 mg Oral Daily  . pantoprazole  40 mg Oral Daily  . sertraline  100 mg Oral Daily  . sodium chloride flush  10-40 mL Intracatheter Q12H   Continuous Infusions:  PRN Meds:.fentaNYL (SUBLIMAZE) injection, hydrALAZINE, menthol-cetylpyridinium, ondansetron (ZOFRAN) IV, oxyCODONE, prochlorperazine, sodium chloride flush  DVT prophylaxis: On Eliquis for atrial fibrillation. Code Status: Full code Family Communication: None at bedside Disposition Plan: Remains inpatient.  Consultants:   General surgery  Oncology  Procedures:   Laparotomy, sigmoid, descending and distal transverse colon resection, Hartmann pouch  Antimicrobials:  Cefotetan 2/13 to 2/16  Objective: Vitals:   01/03/19 0300 01/03/19 0400 01/03/19 0450 01/03/19 0819  BP: 108/66 (!) 136/48    Pulse: 92 91    Resp: (!) 22 (!) 25    Temp:    98.8 F (37.1 C)  TempSrc:    Oral  SpO2: 98% 100%    Weight:   80.7 kg   Height:        Intake/Output Summary (Last 24 hours) at 01/03/2019 1153 Last data filed at 01/03/2019 0634 Gross per 24 hour  Intake 1648.95 ml  Output 3575 ml  Net -1926.05 ml   Filed Weights   01/01/19 0500 01/02/19 0500 01/03/19 0450  Weight:  81.2 kg 80.4 kg 80.7 kg    Examination:  GENERAL: Appears well. No acute distress.  HEENT: MMM.  Vision and Hearing grossly intact.  NECK: Supple.  No JVD.  LUNGS:  No IWOB.  Fair air movement bilaterally.  Rhonchi bilaterally. HEART:   Heart sounds normal.  Heart rate in the range of 80s to 90s ABD: Bowel sounds present.  Mild diffuse tenderness.  Colostomy bag with fecal content.  No notable blood. EXT:   no edema bilaterally.  SKIN: no apparent skin lesion.  NEURO: Awake, alert and oriented appropriately.  No gross deficit.  PSYCH: Calm. Normal affect.   Data Reviewed: I have independently reviewed following labs and imaging studies  CBC: Recent Labs  Lab 12/28/18 0323 12/29/18 0400 12/30/18 0319 12/31/18 0750 01/01/19 0447 01/02/19 0518 01/02/19 1212 01/03/19 0259  WBC 7.1 5.3 6.8 9.2 12.3* 10.0 9.8 11.9*  NEUTROABS 4.3 3.3 4.6  --   --   --   --   --   HGB 10.3* 9.5* 9.3* 10.1* 10.1* 8.2* 8.7* 8.2*  HCT 33.7* 31.1* 30.0* 32.5* 33.7* 27.6* 29.4* 26.8*  MCV 96.0 96.0 96.8 97.3 98.8 98.9 100.0 97.8  PLT 250 141* 128* 158 133* 127* 134* 962*   Basic Metabolic Panel: Recent  Labs  Lab 12/30/18 0319 12/31/18 0337 01/01/19 0447 01/02/19 0518 01/03/19 0259  NA 152* 151* 146* 143 139  K 3.2* 3.1* 4.1 4.0 4.5  CL 109 108 105 101 96*  CO2 31 35* 33* 34* 34*  GLUCOSE 104* 258* 214* 181* 290*  BUN 46* 36* 31* 25* 27*  CREATININE 1.10* 0.85 0.68 0.61 0.74  CALCIUM 7.4* 7.7* 7.8* 7.4* 7.4*  MG 2.7* 2.4 2.0 1.4* 1.7  PHOS 3.3 2.0* 2.3* 3.1 3.4   GFR: Estimated Creatinine Clearance: 63.9 mL/min (by C-G formula based on SCr of 0.74 mg/dL). Liver Function Tests: Recent Labs  Lab 12/28/18 0323 12/30/18 0319 12/31/18 0337 01/02/19 0518  AST 38 21 20  --   ALT 25 23 20   --   ALKPHOS 62 87 84  --   BILITOT 0.4 0.9 0.6  --   PROT 4.5* 4.4* 4.7*  --   ALBUMIN 1.9* 1.5* 1.5* 1.7*   No results for input(s): LIPASE, AMYLASE in the last 168 hours. No results for  input(s): AMMONIA in the last 168 hours. Coagulation Profile: Recent Labs  Lab 12/28/18 0323  INR 1.95   Cardiac Enzymes: No results for input(s): CKTOTAL, CKMB, CKMBINDEX, TROPONINI in the last 168 hours. BNP (last 3 results) No results for input(s): PROBNP in the last 8760 hours. HbA1C: No results for input(s): HGBA1C in the last 72 hours. CBG: Recent Labs  Lab 01/02/19 1134 01/02/19 1540 01/02/19 1932 01/02/19 2256 01/03/19 0809  GLUCAP 270* 267* 278* 282* 251*   Lipid Profile: No results for input(s): CHOL, HDL, LDLCALC, TRIG, CHOLHDL, LDLDIRECT in the last 72 hours. Thyroid Function Tests: Recent Labs    01/03/19 0259  TSH 1.894  FREET4 0.93   Anemia Panel: No results for input(s): VITAMINB12, FOLATE, FERRITIN, TIBC, IRON, RETICCTPCT in the last 72 hours. Urine analysis:    Component Value Date/Time   COLORURINE YELLOW 01/01/2019 1103   APPEARANCEUR CLEAR 01/01/2019 1103   LABSPEC 1.010 01/01/2019 1103   PHURINE 5.5 01/01/2019 1103   GLUCOSEU NEGATIVE 01/01/2019 1103   HGBUR SMALL (A) 01/01/2019 1103   BILIRUBINUR NEGATIVE 01/01/2019 1103   KETONESUR NEGATIVE 01/01/2019 1103   PROTEINUR NEGATIVE 01/01/2019 1103   UROBILINOGEN 0.2 03/25/2015 2317   NITRITE NEGATIVE 01/01/2019 1103   LEUKOCYTESUR NEGATIVE 01/01/2019 1103   Sepsis Labs: Invalid input(s): PROCALCITONIN, LACTICIDVEN  Recent Results (from the past 240 hour(s))  Surgical pcr screen     Status: None   Collection Time: 12/26/18  8:53 PM  Result Value Ref Range Status   MRSA, PCR NEGATIVE NEGATIVE Final   Staphylococcus aureus NEGATIVE NEGATIVE Final    Comment: (NOTE) The Xpert SA Assay (FDA approved for NASAL specimens in patients 32 years of age and older), is one component of a comprehensive surveillance program. It is not intended to diagnose infection nor to guide or monitor treatment. Performed at [facility], [place] [address], [place], [place] [id number]     Culture, Urine     Status: Abnormal   Collection Time: 01/01/19 11:03 AM  Result Value Ref Range Status   Specimen Description   Final    URINE, CATHETERIZED Performed at [place] [address], [place], [place] [address]    Special Requests   Final    NONE Performed at [facility], [place] [address] [place], [place] [address]    Culture >=100,000 COLONIES/mL ENTEROCOCCUS FAECALIS (A)  Final   Report Status 01/03/2019 FINAL  Final  Organism ID, Bacteria ENTEROCOCCUS FAECALIS (A)  Final      Susceptibility   Enterococcus faecalis - MIC*    AMPICILLIN <=2 SENSITIVE Sensitive     LEVOFLOXACIN 1 SENSITIVE Sensitive     NITROFURANTOIN <=16 SENSITIVE Sensitive     VANCOMYCIN 1 SENSITIVE Sensitive     * >=100,000 COLONIES/mL ENTEROCOCCUS FAECALIS      Radiology Studies: No results found.  Murice Barbar T. [facility] Pager [phone]  If 7PM-7AM, please contact night-coverage [email or web address] Password [facility] 01/03/2019, 11:53 AM

## 2019-01-03 NOTE — Discharge Instructions (Signed)
[facility], [place] [phone]  OPEN ABDOMINAL SURGERY: POST OP INSTRUCTIONS  Always review your discharge instruction sheet given to you by the facility where your surgery was performed.  IF YOU HAVE DISABILITY OR FAMILY LEAVE FORMS, YOU MUST BRING THEM TO THE OFFICE FOR PROCESSING.  PLEASE DO NOT GIVE THEM TO YOUR DOCTOR.  1. A prescription for pain medication may be given to you upon discharge.  Take your pain medication as prescribed, if needed.  If narcotic pain medicine is not needed, then you may take acetaminophen (Tylenol) or ibuprofen (Advil) as needed. 2. Take your usually prescribed medications unless otherwise directed. 3. If you need a refill on your pain medication, please contact your pharmacy. They will contact our office to request authorization.  Prescriptions will not be filled after 5pm or on week-ends. 4. You should follow a light diet the first few days after arrival home, such as soup and crackers, pudding, etc.unless your doctor has advised otherwise. A high-fiber, low fat diet can be resumed as tolerated.   Be sure to include lots of fluids daily. Most patients will experience some swelling and bruising on the chest and neck area.  Ice packs will help.  Swelling and bruising can take several days to resolve 5. Most patients will experience some swelling and bruising in the area of the incision. Ice pack will help. Swelling and bruising can take several days to resolve..  6. It is common to experience some constipation if taking pain medication after surgery.  Increasing fluid intake and taking a stool softener will usually help or prevent this problem from occurring.  A mild laxative (Milk of Magnesia or Miralax) should be taken according to package directions if there are no bowel movements after 48 hours. 7.  You may have steri-strips (small skin tapes) in place directly over the incision.  These strips should be left on the skin for 7-10 days.  If your  surgeon used skin glue on the incision, you may shower in 24 hours.  The glue will flake off over the next 2-3 weeks.  Any sutures or staples will be removed at the office during your follow-up visit. You may find that a light gauze bandage over your incision may keep your staples from being rubbed or pulled. You may shower and replace the bandage daily. 8. ACTIVITIES:  You may resume regular (light) daily activities beginning the next day--such as daily self-care, walking, climbing stairs--gradually increasing activities as tolerated.  You may have sexual intercourse when it is comfortable.  Refrain from any heavy lifting or straining until approved by your doctor. a. You may drive when you no longer are taking prescription pain medication, you can comfortably wear a seatbelt, and you can safely maneuver your car and apply brakes  9. You should see your doctor in the office for a follow-up appointment.  Make sure that you call for this appointment within a day or two after you arrive home to insure a convenient appointment time.    WHEN TO CALL YOUR DOCTOR: 1. Fever over 101.0 2. Inability to urinate 3. Nausea and/or vomiting 4. Extreme swelling or bruising 5. Continued bleeding from incision. 6. Increased pain, redness, or drainage from the incision. 7. Difficulty swallowing or breathing 8. Muscle cramping or spasms. 9. Numbness or tingling in hands or feet or around lips.  The clinic staff is available to answer your questions during regular business hours.  Please dont hesitate to call  and ask to speak to one of the nurses if you have concerns.  For further questions, please visit [email or web address]   Colostomy Home Guide, Adult  Colostomy surgery is done to create an opening in the front of the abdomen for stool (feces) to leave the body through an ostomy (stoma). Part of the large intestine is attached to the stoma. A bag, also called a pouch, is fitted over the stoma.  Stool and gas will collect in the bag. After surgery, you will need to empty and change your colostomy bag as needed. You will also need to care for your stoma. How to care for the stoma Your stoma should look pink, red, and moist, like the inside of your cheek. Soon after surgery, the stoma may be swollen, but this swelling will go away within 6 weeks. To care for the stoma:  Keep the skin around the stoma clean and dry.  Use a clean, soft washcloth to gently wash the stoma and the skin around it. Clean using a circular motion, and wipe away from the stoma opening, not toward it. ? Use warm water and only use cleansers recommended by your health care provider. ? Rinse the stoma area with plain water. ? Dry the area around the stoma well.  Use stoma powder or ointment on your skin only as told by your health care provider. Do not use any other powders, gels, wipes, or creams on the skin around the stoma.  Check the stoma area every day for signs of infection. Check for: ? New or worsening redness, swelling, or pain. ? New or increased fluid or blood. ? Pus or warmth.  Measure the stoma opening regularly and record the size. Watch for changes. (It is normal for the stoma to get smaller as swelling goes away.) Share this information with your health care provider. How to empty the colostomy bag  Empty your bag at bedtime and whenever it is one-third to one-half full. Do not let the bag get more than half-full with stool or gas. The bag could leak if it gets too full. Some colostomy bags have a built-in gas release valve that releases gas often throughout the day. Follow these basic steps: 1. Wash your hands with soap and water. 2. Sit far back on the toilet seat. 3. Put several pieces of toilet paper into the toilet water. This will prevent splashing as you empty stool into the toilet. 4. Remove the clip or the hook-and-loop fastener from the tail end of the bag. 5. Unroll the tail, then  empty the stool into the toilet. 6. Clean the tail with toilet paper or a moist towelette. 7. Reroll the tail, and close it with the clip or the hook-and-loop fastener. 8. Wash your hands again. How to change the colostomy bag Change your bag every 3-4 days or as often as told by your health care provider. Also change the bag if it is leaking or separating from the skin, or if your skin around the stoma looks or feels irritated. Irritated skin may be a sign that the bag is leaking. Always have colostomy supplies with you, and follow these basic steps: 1. Wash your hands with soap and water. Have paper towels or tissues nearby to clean any discharge. 2. Remove the old bag and skin barrier. Use your fingers or a warm cloth to gently push the skin away from the barrier. 3. Clean the stoma area with water or with mild soap and water, as directed. Use  water to rinse away any soap. 4. Dry the skin. You may use the cool setting on a hair dryer to do this. 5. Use a tracing pattern (template) to cut the skin barrier to the size needed. 6. If you are using a two-piece bag, attach the bag and the skin barrier to each other. Add the barrier ring, if you use one. 7. If directed, apply stoma powder or skin barrier gel to the skin. 8. Warm the skin barrier with your hands, or blow with a hair dryer for 5-10 seconds. 9. Remove the paper from the adhesive strip of the skin barrier. 10. Press the adhesive strip onto the skin around the stoma. 11. Gently rub the skin barrier onto the skin. This creates heat that helps the barrier to stick. 12. Apply stoma tape to the edges of the skin barrier, if desired. 46. Wash your hands again. General recommendations  Avoid wearing tight clothes or having anything press directly on your stoma or bag. Change your clothing whenever it is soiled or damp.  You may shower or bathe with the bag on or off. Do not use harsh or oily soaps or lotions. Dry the skin and bag after  bathing.  Store all supplies in a cool, dry place. Do not leave supplies in extreme heat because some parts can melt or not stick as well.  Whenever you leave home, take extra clothing and an extra skin barrier and bag with you.  If your bag gets wet, you can dry it with a hair dryer on the cool setting.  To prevent odor, you may put drops of ostomy deodorizer in the bag.  If recommended by your health care provider, put ostomy lubricant inside the bag. This helps stool to slide out of the bag more easily and completely. Contact a health care provider if:  You have new or worsening redness, swelling, or pain around your stoma.  You have new or increased fluid or blood coming from your stoma.  Your stoma feels warm to the touch.  You have pus coming from your stoma.  Your stoma extends in or out farther than normal.  You need to change your bag every day.  You have a fever. Get help right away if:  Your stool is bloody.  You have nausea or you vomit.  You have trouble breathing. Summary  Measure your stoma opening regularly and record the size. Watch for changes.  Empty your bag at bedtime and whenever it is one-third to one-half full. Do not let the bag get more than half-full with stool or gas.  Change your bag every 3-4 days or as often as told by your health care provider.  Whenever you leave home, take extra clothing and an extra skin barrier and bag with you. This information is not intended to replace advice given to you by your health care provider. Make sure you discuss any questions you have with your health care provider. Document Released: 11/02/2003 Document Revised: 07/05/2018 Document Reviewed: 04/25/2017 Elsevier Interactive Patient Education  [employer].

## 2019-01-03 NOTE — Progress Notes (Signed)
[facility] Progress Note  7 Days Post-Op  Subjective: CC: abdominal pain Patient still having generalized abdominal pain. Per MAR taking mostly IV pain medication still. Tolerating FLD and denies nausea. Having stool output from stoma. Was unable to get up to chair yesterday.   Objective: Vital signs in last 24 hours: Temp:  [98.1 F (36.7 C)-99 F (37.2 C)] 98.1 F (36.7 C) (02/20 1933) Pulse Rate:  [77-120] 91 (02/21 0400) Resp:  [18-35] 25 (02/21 0400) BP: (108-166)/(47-87) 136/48 (02/21 0400) SpO2:  [89 %-100 %] 100 % (02/21 0400) Weight:  [80.7 kg] 80.7 kg (02/21 0450) Last BM Date: 01/02/19  Intake/Output from previous day: 02/20 0701 - 02/21 0700 In: 2281.1 [P.O.:720; I.V.:1511.1; IV Piggyback:50] Out: 3662 [Urine:3000; Stool:575] Intake/Output this shift: No intake/output data recorded.  PE: Gen:  Alert, NAD, pleasant Card:  Irregularly irregular Pulm:  Normal effort, on 4L via [place] Abd: Soft, diffusely TTP, non-distended, +BS, stoma with soft brown stool output, incision clean with some granulation at open portion of wound Skin: warm and dry, no rashes  Psych: A&Ox4  Lab Results:  Recent Labs    01/02/19 1212 01/03/19 0259  WBC 9.8 11.9*  HGB 8.7* 8.2*  HCT 29.4* 26.8*  PLT 134* 136*   BMET Recent Labs    01/02/19 0518 01/03/19 0259  NA 143 139  K 4.0 4.5  CL 101 96*  CO2 34* 34*  GLUCOSE 181* 290*  BUN 25* 27*  CREATININE 0.61 0.74  CALCIUM 7.4* 7.4*   PT/INR No results for input(s): LABPROT, INR in the last 72 hours. CMP     Component Value Date/Time   NA 139 01/03/2019 0259   K 4.5 01/03/2019 0259   CL 96 (L) 01/03/2019 0259   CO2 34 (H) 01/03/2019 0259   GLUCOSE 290 (H) 01/03/2019 0259   BUN 27 (H) 01/03/2019 0259   CREATININE 0.74 01/03/2019 0259   CREATININE 1.04 (H) 12/18/2018 1029   CALCIUM 7.4 (L) 01/03/2019 0259   PROT 4.7 (L) 12/31/2018 0337   ALBUMIN 1.7 (L) 01/02/2019 0518   AST 20 12/31/2018 0337   AST 60  (H) 12/18/2018 1029   ALT 20 12/31/2018 0337   ALT 117 (H) 12/18/2018 1029   ALKPHOS 84 12/31/2018 0337   BILITOT 0.6 12/31/2018 0337   BILITOT 0.5 12/18/2018 1029   GFRNONAA >60 01/03/2019 0259   GFRNONAA 54 (L) 12/18/2018 1029   GFRAA >60 01/03/2019 0259   GFRAA >60 12/18/2018 1029   Lipase     Component Value Date/Time   LIPASE 24 12/25/2018 2254       Studies/Results: No results found.  Anti-infectives: Anti-infectives (From admission, onward)   Start     Dose/Rate Route Frequency Ordered Stop   12/29/18 1000  cefoTEtan (CEFOTAN) 2 g in sodium chloride 0.9 % 100 mL IVPB  Status:  Discontinued     2 g 200 mL/hr over 30 Minutes Intravenous Every 24 hours 12/28/18 1654 12/29/18 0945   12/29/18 1000  cefoTEtan (CEFOTAN) 2 g in sodium chloride 0.9 % 100 mL IVPB  Status:  Discontinued     2 g 200 mL/hr over 30 Minutes Intravenous Every 12 hours 12/29/18 0945 12/30/18 0738   12/27/18 2200  cefoTEtan (CEFOTAN) 2 g in sodium chloride 0.9 % 100 mL IVPB  Status:  Discontinued     2 g 200 mL/hr over 30 Minutes Intravenous Every 12 hours 12/27/18 1449 12/28/18 1654   12/27/18 1039  sodium chloride 0.9 % with cefoTEtan (  CEFOTAN) ADS Med    Note to Pharmacy:  Chrystine Oiler: cabinet override      12/27/18 1039 12/27/18 1130   12/26/18 1230  cefoTEtan (CEFOTAN) 2 g in sodium chloride 0.9 % 100 mL IVPB     2 g 200 mL/hr over 30 Minutes Intravenous On call to O.R. 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 Hassell Done -POD#7 - tolerating FLD - advance to soft diet, d/c TPN -surgical pathology consistent with adenocarcinoma in proximal descending colon mass, sigmoid mass consistent with large villous adenoma - MOBILIZE! - BID dressing changes to midline wound, if leaking from ostomy appliance again change Liver metastasis- per oncology Enterococcus Faecalis UTI - seen on urine cx, WBC 11.9, d/c foley and start abx  PJA:SNKN, d/c TPN  ID: cefotetan 2/13>2/17; unasyn 2/21>> LZJ:QBHALPF gtt - ok to transition back to eliquis Foley: d/c today  Follow up: Dr. Linus Salmons to transfer out of SDU from a surgical standpoint  LOS: 8 days    Brigid Re , [facility] Surgery 01/03/2019, 7:20 AM Pager: [place]: [phone]

## 2019-01-03 NOTE — Progress Notes (Signed)
ANTICOAGULATION CONSULT NOTE - Follow Up Consult  Pharmacy Consult for heparin Indication: atrial fibrillation  Allergies  Allergen Reactions  . Metformin And Related Nausea And Vomiting  . Prednisone Nausea And Vomiting    Patient Measurements: Height: 5\' 3"  (160 cm) Weight: 177 lb 4 oz (80.4 kg) IBW/kg (Calculated) : 52.4 Heparin Dosing Weight: 69.7 kg  Vital Signs: Temp: 98.1 F (36.7 C) (02/20 1933) Temp Source: Oral (02/20 1933) BP: 161/51 (02/20 2215) Pulse Rate: 109 (02/20 2215)  Labs: Recent Labs    12/31/18 0337  01/01/19 0447 01/02/19 0518 01/02/19 1212 01/02/19 1435 01/03/19 0013  HGB  --    < > 10.1* 8.2* 8.7*  --   --   HCT  --    < > 33.7* 27.6* 29.4*  --   --   PLT  --    < > 133* 127* 134*  --   --   HEPARINUNFRC 0.39  --  0.31 0.16*  --  0.18* 0.24*  CREATININE 0.85  --  0.68 0.61  --   --   --    < > = values in this interval not displayed.    Estimated Creatinine Clearance: 63.8 mL/min (by C-G formula based on SCr of 0.61 mg/dL).    Assessment: 72 yoF on apixaban 5 mg po BID PTA for afib.  S/p OR 2/14 for resection and colostomy d/t obstructive colonic mass in the setting of newly diagnosed colon cancer.  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 (weakly elevated)  Discussed borderline result with E-Link MD; my best estimated 4T score is </= 3 (recent surgery, no thrombosis; early platelet fall but did have multiple exposures to heparin flushes at [facility]) - per [facility] HIT algorithm, for 4T score </= 5, disregard OD of < 0.6 and continue heparin; CCM in agreement as long as CBC closely followed   2/19  Heparin level remains therapeutic on current rate of 850 units/hr  CBC:  Hgb stable, Plts dropped to 133 - monitor closely  See  notes above regarding HIT Ab 0.434 (weakly elevated)  No bleeding or complications noted 2/01  0518 HL = 0.16 below goal, no bleeding or IV interuptions per RN.  H/H=8.2- down from 10.1/27.6, plts = 127- slight drop from 2/19  1435 HL - 0.18 remains subtherapeutic after rate increased from 850 to 1000 units/hr, repeat Hg 8.7. stable from am Hg 8.2, yesterday Hg 10.1.  PLTC remains low at 134.  No bleeding reported. Today, 2/21  0017 HL= 0.24 below goal, no interuptions or bleeding per RN. RN reports good IV site.   Goal of Therapy: Heparin level 0.3-0.7 units/ml Monitor platelets by anticoagulation protocol: Yes  Plan:  Increase heparin drip to 1300 units/hr  Daily CBC and heparin level   Monitor for signs of bleeding or thrombosis  Follow up resuming eliquis soon once taking POs well   Dorrene German 01/03/2019 12:57 AM

## 2019-01-03 NOTE — Progress Notes (Signed)
[facility]   DOB:12-Apr-1946   KK#:[id number]   ZJI#:[id number]  Oncology follow-up note  Subjective: Pt remains to be in ICU, heart rate is controlled, she is hemodynamically stable and afebrile.  Colostomy output is normal.  However she is still very lethargic, with a tender abdomen, she has not sit up, or gotten out of bed, has not done spirometry or much any activities. Her son is concerned about her slow recovery.   Objective:  Vitals:   01/03/19 1700 01/03/19 1743  BP:  121/60  Pulse:  93  Resp:  19  Temp: 99.2 F (37.3 C) 99.7 F (37.6 C)  SpO2:  100%    Body mass index is 31.52 kg/m.  Intake/Output Summary (Last 24 hours) at 01/03/2019 1826 Last data filed at 01/03/2019 1200 Gross per 24 hour  Intake 1077.34 ml  Output 2200 ml  Net -1122.66 ml     Sclerae unicteric  Oropharynx clear  No peripheral adenopathy  Lungs clear -- no rales or rhonchi  Heart regular rate and rhythm  Abdomen soft, with diffuse tenderness, incision covered, staples appears to be clean without any discharge   MSK no focal spinal tenderness, no peripheral edema  Neuro nonfocal    CBG (last 3)  Recent Labs    01/03/19 0809 01/03/19 1453 01/03/19 1635  GLUCAP 251* 172* 183*     Labs:  Lab Results  Component Value Date   WBC 11.9 (H) 01/03/2019   HGB 8.2 (L) 01/03/2019   HCT 26.8 (L) 01/03/2019   MCV 97.8 01/03/2019   PLT 136 (L) 01/03/2019   NEUTROABS 4.6 12/30/2018   CMP Latest Ref Rng & Units 01/03/2019 01/02/2019 01/01/2019  Glucose 70 - 99 mg/dL 290(H) 181(H) 214(H)  BUN 8 - 23 mg/dL 27(H) 25(H) 31(H)  Creatinine 0.44 - 1.00 mg/dL 0.74 0.61 0.68  Sodium 135 - 145 mmol/L 139 143 146(H)  Potassium 3.5 - 5.1 mmol/L 4.5 4.0 4.1  Chloride 98 - 111 mmol/L 96(L) 101 105  CO2 22 - 32 mmol/L 34(H) 34(H) 33(H)  Calcium 8.9 - 10.3 mg/dL 7.4(L) 7.4(L) 7.8(L)  Total Protein 6.5 - 8.1 g/dL - - -  Total Bilirubin 0.3 - 1.2 mg/dL - - -  Alkaline Phos 38 - 126 U/L - - -  AST 15 -  41 U/L - - -  ALT 0 - 44 U/L - - -     Urine Studies No results for input(s): UHGB, CRYS in the last 72 hours.  Invalid input(s): UACOL, UAPR, USPG, UPH, UTP, UGL, UKET, UBIL, UNIT, UROB, [place], UEPI, UWBC, Junie P[place], [place], [place]  Basic Metabolic Panel: Recent Labs  Lab 12/30/18 0319 12/31/18 0337 01/01/19 0447 01/02/19 0518 01/03/19 0259  NA 152* 151* 146* 143 139  K 3.2* 3.1* 4.1 4.0 4.5  CL 109 108 105 101 96*  CO2 31 35* 33* 34* 34*  GLUCOSE 104* 258* 214* 181* 290*  BUN 46* 36* 31* 25* 27*  CREATININE 1.10* 0.85 0.68 0.61 0.74  CALCIUM 7.4* 7.7* 7.8* 7.4* 7.4*  MG 2.7* 2.4 2.0 1.4* 1.7  PHOS 3.3 2.0* 2.3* 3.1 3.4   GFR Estimated Creatinine Clearance: 63.9 mL/min (by C-G formula based on SCr of 0.74 mg/dL). Liver Function Tests: Recent Labs  Lab 12/28/18 0323 12/30/18 0319 12/31/18 0337 01/02/19 0518  AST 38 21 20  --   ALT 25 23 20   --   ALKPHOS 62 87 84  --   BILITOT 0.4 0.9 0.6  --  PROT 4.5* 4.4* 4.7*  --   ALBUMIN 1.9* 1.5* 1.5* 1.7*   No results for input(s): LIPASE, AMYLASE in the last 168 hours. No results for input(s): AMMONIA in the last 168 hours. Coagulation profile Recent Labs  Lab 12/28/18 0323  INR 1.95    CBC: Recent Labs  Lab 12/28/18 0323 12/29/18 0400 12/30/18 0319 12/31/18 0750 01/01/19 0447 01/02/19 0518 01/02/19 1212 01/03/19 0259  WBC 7.1 5.3 6.8 9.2 12.3* 10.0 9.8 11.9*  NEUTROABS 4.3 3.3 4.6  --   --   --   --   --   HGB 10.3* 9.5* 9.3* 10.1* 10.1* 8.2* 8.7* 8.2*  HCT 33.7* 31.1* 30.0* 32.5* 33.7* 27.6* 29.4* 26.8*  MCV 96.0 96.0 96.8 97.3 98.8 98.9 100.0 97.8  PLT 250 141* 128* 158 133* 127* 134* 136*   Cardiac Enzymes: No results for input(s): CKTOTAL, CKMB, CKMBINDEX, TROPONINI in the last 168 hours. BNP: Invalid input(s): POCBNP CBG: Recent Labs  Lab 01/02/19 1932 01/02/19 2256 01/03/19 0809 01/03/19 1453 01/03/19 1635  GLUCAP 278* 282* 251* 172* 183*   D-Dimer No results for input(s):  DDIMER in the last 72 hours. Hgb A1c No results for input(s): HGBA1C in the last 72 hours. Lipid Profile No results for input(s): CHOL, HDL, LDLCALC, TRIG, CHOLHDL, LDLDIRECT in the last 72 hours. Thyroid function studies Recent Labs    01/03/19 0259  TSH 1.894   Anemia work up No results for input(s): VITAMINB12, FOLATE, FERRITIN, TIBC, IRON, RETICCTPCT in the last 72 hours. Microbiology Recent Results (from the past 240 hour(s))  Surgical pcr screen     Status: None   Collection Time: 12/26/18  8:53 PM  Result Value Ref Range Status   MRSA, PCR NEGATIVE NEGATIVE Final   Staphylococcus aureus NEGATIVE NEGATIVE Final    Comment: (NOTE) The Xpert SA Assay (FDA approved for NASAL specimens in patients 34 years of age and older), is one component of a comprehensive surveillance program. It is not intended to diagnose infection nor to guide or monitor treatment. Performed at [facility], [place] [address], [place], [place] [address]   Culture, Urine     Status: Abnormal   Collection Time: 01/01/19 11:03 AM  Result Value Ref Range Status   Specimen Description   Final    URINE, CATHETERIZED Performed at [place] [address] [place], [place] [address]    Special Requests   Final    NONE Performed at [facility], [place] [address] [place], [place] [address]    Culture >=100,000 COLONIES/mL ENTEROCOCCUS FAECALIS (A)  Final   Report Status 01/03/2019 FINAL  Final   Organism ID, Bacteria ENTEROCOCCUS FAECALIS (A)  Final      Susceptibility   Enterococcus faecalis - MIC*    AMPICILLIN <=2 SENSITIVE Sensitive     LEVOFLOXACIN 1 SENSITIVE Sensitive     NITROFURANTOIN <=16 SENSITIVE Sensitive     VANCOMYCIN 1 SENSITIVE Sensitive     * >=100,000 COLONIES/mL ENTEROCOCCUS FAECALIS      Studies:  No results found.  Assessment: 73 y.o. with metastatic colon cancer, iron deficient anemia, admitted for bowel obstruction  secondary to colon mass  1.  Bowel obstruction secondary to colon cancer, s/p left hemicolectomy  2.  Atrial fibrillation with rapid ventricular response, rate controlled now, back on Eliquis   4. AKI resolved  5.  Anemia secondary to GI bleeding from the tumor, chemotherapy and surgery, stable  6.  Metastatic left colon cancer,  status post chemotherapy FOLFOX for 2 cycles, last dose 12/18/2018, status post a left hemicolectomy on December 27, 2018 7.  History of diastolic heart failure, recent hospitalization for CHF exacerbation  8.  mild thrombocytopenia after surgery, likely related to her acute illness and surgery  9. Malnutrition: on TPN  10. DM: on insulin  11. Depression: on zoloft    Plan:  -Patient is recovering from surgery slowly. She seems to be not motivated, has not done much activities any all   -I encourage her to sit up, and try to be our of bed if pain is controlled  -lab reviewed, anemia slightly worse, consider blood transfusion if Hg<7.5. I will check iron study again, to see if she needs iv iron  -mild thrombocytopenia is stable, likely related to the acute illness, no suspicion for HIT or other etiology. Will continue monitoring  -I will f/u next week   Truitt Merle, MD 01/03/2019  6:26 PM

## 2019-01-04 LAB — PREPARE RBC (CROSSMATCH)

## 2019-01-04 LAB — BASIC METABOLIC PANEL
Anion gap: 6 (ref 5–15)
BUN: 30 mg/dL — ABNORMAL HIGH (ref 8–23)
CO2: 36 mmol/L — ABNORMAL HIGH (ref 22–32)
Calcium: 7.6 mg/dL — ABNORMAL LOW (ref 8.9–10.3)
Chloride: 98 mmol/L (ref 98–111)
Creatinine, Ser: 0.77 mg/dL (ref 0.44–1.00)
GFR calc Af Amer: >60 mL/min (ref >60)
GFR calc non Af Amer: >60 mL/min (ref >60)
Glucose, Bld: 108 mg/dL — ABNORMAL HIGH (ref 70–99)
Potassium: 5 mmol/L (ref 3.5–5.1)
Sodium: 140 mmol/L (ref 135–145)

## 2019-01-04 LAB — GLUCOSE, CAPILLARY
Glucose-Capillary: 111 mg/dL — ABNORMAL HIGH (ref 70–99)
Glucose-Capillary: 118 mg/dL — ABNORMAL HIGH (ref 70–99)
Glucose-Capillary: 121 mg/dL — ABNORMAL HIGH (ref 70–99)

## 2019-01-04 LAB — IRON AND TIBC
Iron: 11 ug/dL — ABNORMAL LOW (ref 28–170)
Saturation Ratios: 9 % — ABNORMAL LOW (ref 10.4–31.8)
TIBC: 126 ug/dL — ABNORMAL LOW (ref 250–450)
UIBC: 115 ug/dL

## 2019-01-04 LAB — HEMOGLOBIN AND HEMATOCRIT, BLOOD
HEMATOCRIT: 29.1 % — AB (ref 36.0–46.0)
Hemoglobin: 9 g/dL — ABNORMAL LOW (ref 12.0–15.0)

## 2019-01-04 LAB — CBC
HEMATOCRIT: 25.9 % — AB (ref 36.0–46.0)
Hemoglobin: 7.6 g/dL — ABNORMAL LOW (ref 12.0–15.0)
MCH: 29.1 pg (ref 26.0–34.0)
MCHC: 29.3 g/dL — ABNORMAL LOW (ref 30.0–36.0)
MCV: 99.2 fL (ref 80.0–100.0)
Platelets: 152 10*3/uL (ref 150–400)
RBC: 2.61 MIL/uL — ABNORMAL LOW (ref 3.87–5.11)
RDW: 15.9 % — AB (ref 11.5–15.5)
WBC: 14.3 10*3/uL — ABNORMAL HIGH (ref 4.0–10.5)
nRBC: 0 % (ref 0.0–0.2)

## 2019-01-04 LAB — VITAMIN B12: Vitamin B-12: 1083 pg/mL — ABNORMAL HIGH (ref 180–914)

## 2019-01-04 LAB — RETICULOCYTES
Immature Retic Fract: 22.6 % — ABNORMAL HIGH (ref 2.3–15.9)
RBC.: 2.56 MIL/uL — ABNORMAL LOW (ref 3.87–5.11)
RETIC COUNT ABSOLUTE: 48.6 10*3/uL (ref 19.0–186.0)
Retic Ct Pct: 1.9 % (ref 0.4–3.1)

## 2019-01-04 LAB — FOLATE: Folate: 34 ng/mL (ref >5.9)

## 2019-01-04 LAB — DIGOXIN LEVEL: Digoxin Level: 0.8 ng/mL (ref 0.8–2.0)

## 2019-01-04 LAB — FERRITIN: Ferritin: 859 ng/mL — ABNORMAL HIGH (ref 11–307)

## 2019-01-04 MED ORDER — SODIUM CHLORIDE 0.9% IV SOLUTION
Freq: Once | INTRAVENOUS | Status: AC
  Administered 2019-01-04: 19:00:00 via INTRAVENOUS

## 2019-01-04 MED ORDER — FUROSEMIDE 20 MG PO TABS
20.0000 mg | ORAL_TABLET | Freq: Every day | ORAL | Status: DC
  Administered 2019-01-04: 20 mg via ORAL
  Filled 2019-01-04: qty 1

## 2019-01-04 NOTE — Progress Notes (Signed)
8 Days Post-Op   Subjective/Chief Complaint: Feels a little better today Tolerating a soft diet, not taking in a lot po   Objective: Vital signs in last 24 hours: Temp:  [97.2 F (36.2 C)-99.7 F (37.6 C)] 97.2 F (36.2 C) (02/22 0521) Pulse Rate:  [60-100] 90 (02/22 0521) Resp:  [16-34] 16 (02/22 0521) BP: (113-136)/(56-69) 113/57 (02/22 0521) SpO2:  [89 %-100 %] 100 % (02/22 0521) Weight:  [82.1 kg] 82.1 kg (02/22 0500) Last BM Date: 01/02/19  Intake/Output from previous day: 02/21 0701 - 02/22 0700 In: 451.2 [P.O.:180; I.V.:271; IV Piggyback:0.2] Out: 775 [Urine:775] Intake/Output this shift: No intake/output data recorded.  Exam: Awake and alert Abdomen soft, incision and wound clean Ostomy retracted but good output  Lab Results:  Recent Labs    01/03/19 0259 01/04/19 0309  WBC 11.9* 14.3*  HGB 8.2* 7.6*  HCT 26.8* 25.9*  PLT 136* 152   BMET Recent Labs    01/03/19 0259 01/04/19 0309  NA 139 140  K 4.5 5.0  CL 96* 98  CO2 34* 36*  GLUCOSE 290* 108*  BUN 27* 30*  CREATININE 0.74 0.77  CALCIUM 7.4* 7.6*   PT/INR No results for input(s): LABPROT, INR in the last 72 hours. ABG No results for input(s): PHART, HCO3 in the last 72 hours.  Invalid input(s): PCO2, PO2  Studies/Results: No results found.  Anti-infectives: Anti-infectives (From admission, onward)   Start     Dose/Rate Route Frequency Ordered Stop   01/03/19 1000  amoxicillin (AMOXIL) capsule 500 mg     500 mg Oral Every 8 hours 01/03/19 0835     01/03/19 0800  ampicillin-sulbactam (UNASYN) 1.5 g in sodium chloride 0.9 % 100 mL IVPB  Status:  Discontinued     1.5 g 200 mL/hr over 30 Minutes Intravenous Every 6 hours 01/03/19 0738 01/03/19 0834   12/29/18 1000  cefoTEtan (CEFOTAN) 2 g in sodium chloride 0.9 % 100 mL IVPB  Status:  Discontinued     2 g 200 mL/hr over 30 Minutes Intravenous Every 24 hours 12/28/18 1654 12/29/18 0945   12/29/18 1000  cefoTEtan (CEFOTAN) 2 g in sodium  chloride 0.9 % 100 mL IVPB  Status:  Discontinued     2 g 200 mL/hr over 30 Minutes Intravenous Every 12 hours 12/29/18 0945 12/30/18 0738   12/27/18 2200  cefoTEtan (CEFOTAN) 2 g in sodium chloride 0.9 % 100 mL IVPB  Status:  Discontinued     2 g 200 mL/hr over 30 Minutes Intravenous Every 12 hours 12/27/18 1449 12/28/18 1654   12/27/18 1039  sodium chloride 0.9 % with cefoTEtan (CEFOTAN) ADS Med    Note to Pharmacy:  Chrystine Oiler: cabinet override      12/27/18 1039 12/27/18 1130   12/26/18 1230  cefoTEtan (CEFOTAN) 2 g in sodium chloride 0.9 % 100 mL IVPB     2 g 200 mL/hr over 30 Minutes Intravenous On call to O.R. 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 Hassell Done  Now with UTI Stable from abdominal standpoint  Encourage increased po intake and activity  LOS: 9 days    Coralie Keens 01/04/2019

## 2019-01-04 NOTE — Progress Notes (Signed)
PROGRESS NOTE  Cassandra Allen:[id number] DOB: [birth year] DOA: 12/25/2018 PCP: [name], MD   LOS: 9 days   Brief Narrative / Interim history: 73 year old female with history of newly diagnosed colon cancer on 10/25/2018 with metastasis started on FOLFOX biweekly on 12/04/2018, iron deficiency anemia, DM 2, hypertension, CKD and atrial fibrillation on anticoagulation who presented with abdominal pain and found to have obstructing colon mass.  Patient had laparotomy with resection of sigmoid colon, descending colon and distal transverse colon, and colostomy.  Pathology showed moderately differentiated adenocarcinoma.  Hospitalization complicated by hypotension, anemia, and A. fib with RVR for which she was transferred to stepdown unit on 2/13.  Started on metoprolol IV and heparin.  Heart rate improved to 80s and 90s.  Transitioned to metoprolol 25 mg twice daily and Eliquis.  Subjective: No major events overnight of this morning.  No complaints this morning.  Feels a little bit better overall.  Denies chest pain, dyspnea, abdominal pain or dysuria.  Tolerating soft diet.  She has not gotten out of bed yet.   Assessment & Plan: Principal Problem:   Colonic obstruction due to metastatic colon cancer Active Problems:   Essential (primary) hypertension   Diabetes mellitus without complication (HCC)   AKI (acute kidney injury) ([place])   Iron deficiency anemia   Cancer of splenic flexure of colon   Atrial fibrillation, chronic   Anticoagulant long-term use   Cancer of sigmoid colon    History of adenomatous polyp of colon   Acute diastolic heart failure (HCC)   Liver metastases from liver cancer   CKD (chronic kidney disease) stage 3, GFR 30-59 ml/min (HCC)   Obesity (BMI 30-39.9)   Choledocholithiasis by MRCP   Status post partial colectomy Feb 2020  Obstructive colonic mass: Status post laparotomy, sigmoid, descending colon and distal transverse colon resection with  transverse colostomy on 12/27/2018.  Pathology showed invasive moderately differentiated adenocarcinoma.  Also concern about liver metastasis. -General surgery on board. -Defer pain management to general surgery. -Cefotetan 2/13 to 2/16. -TPN 2/15--> -Tolerating soft diet.  -No plan for chemo in the near future until she recovers per oncology, Dr. Burr Medico. -Out of bed x3 a day for meals. -Encourage incentive spirometry. -Wean off oxygen. -PT/OT--SNF -Encourage incentive spirometry  Mild leukocytosis: Afebrile.  CXR without pneumonia.  Urinalysis negative. -Continue monitoring  A. fib with RVR: Now rate controlled.  [name] vascular score 5. -Continue metoprolol 30 mg twice daily -Oral digoxin 0.125 mg daily -Continue home Eliquis. -Check digoxin level  Diastolic CHF exacerbation: Echo on 12/23/2018 with EF of 60 to 65% but no other significant structural functional abnormalities.  Patient with tachypnea.  Chest x-ray with cardiomegaly and pulmonary vascular congestion.  Good urine output -Transition to oral Lasix 40 mg daily -Daily weight, intake output and renal function. -Replenish electrolytes as appropriate.  Hypernatremia: Resolved. -Continue monitoring  Hypomagnesemia: due to diuretics?  Improved. -Replace and recheck.  Hypertension: Normotensive -Metoprolol and Lasix as above -PRN hydralazine  Mild thrombocytopenia: Stable.  Unlikely HIT given timing also heparin-induced platelet antibody slightly positive. -Continue monitoring -May be able to transition to Eliquis now she is tolerating clear liquids by mouth  Anemia secondary to GI bleed/chronic disease: Hgb 10--> 8.2-->--> 7.6.  No obvious source of bleeding but she is on Eliquis. -Transfuse 1 unit -Anemia profile -Posttransfusion H&H and CBC in the morning  DM2: CBG fairly controlled. -Start Lantus 10 units twice daily -Continue SSI-high  Enterococcus UTI/bacteriuria: sensitive to ampicillin. -Started amoxicillin  2/21-->  Scheduled Meds: . sodium chloride   Intravenous Once  . acetaminophen  650 mg Oral Q6H  . amoxicillin  500 mg Oral Q8H  . apixaban  5 mg Oral BID  . chlorhexidine  15 mL Mouth Rinse BID  . Chlorhexidine Gluconate Cloth  6 each Topical Daily  . digoxin  0.125 mg Oral Daily  . feeding supplement (ENSURE ENLIVE)  237 mL Oral BID BM  . feeding supplement (PRO-STAT SUGAR FREE 64)  30 mL Oral BID  . insulin aspart  0-20 Units Subcutaneous TID WC  . insulin aspart  0-5 Units Subcutaneous QHS  . insulin glargine  10 Units Subcutaneous BID  . lip balm  1 application Topical BID  . mouth rinse  15 mL Mouth Rinse q12n4p  . methocarbamol  500 mg Oral TID  . metoprolol tartrate  25 mg Oral BID  . oxybutynin  20 mg Oral Daily  . pantoprazole  40 mg Oral Daily  . sertraline  100 mg Oral Daily  . sodium chloride flush  10-40 mL Intracatheter Q12H   Continuous Infusions:  PRN Meds:.fentaNYL (SUBLIMAZE) injection, hydrALAZINE, menthol-cetylpyridinium, ondansetron (ZOFRAN) IV, oxyCODONE, prochlorperazine  DVT prophylaxis: On Eliquis for atrial fibrillation. Code Status: Full code Family Communication: None at bedside Disposition Plan: remains inpatient.  May be transferred to SNF once she is off TPN  Consultants:   General surgery  Oncology  Procedures:   Laparotomy, sigmoid, descending and distal transverse colon resection, Hartmann pouch  Antimicrobials:  Cefotetan 2/13 to 2/16  Objective: Vitals:   01/03/19 2257 01/04/19 0500 01/04/19 0521 01/04/19 0837  BP:   (!) 113/57   Pulse: 99  90 97  Resp:   16   Temp:   (!) 97.2 F (36.2 C)   TempSrc:   Axillary   SpO2:   100%   Weight:  82.1 kg    Height:        Intake/Output Summary (Last 24 hours) at 01/04/2019 1122 Last data filed at 01/04/2019 1005 Gross per 24 hour  Intake 400.19 ml  Output 375 ml  Net 25.19 ml   Filed Weights   01/02/19 0500 01/03/19 0450 01/04/19 0500  Weight: 80.4 kg 80.7 kg 82.1 kg     Examination:  GENERAL: Appears well. No acute distress.  HEENT: MMM.  Vision and Hearing grossly intact.  NECK: Supple.  No JVD.  LUNGS:  No IWOB.  Poor inspiratory effort.  Fair air movement bilaterally.  Rhonchi bilaterally.  500 cc on incentive spirometry. HEART: Irregular rhythm.  Normal rate. Heart sounds normal.  ABD: Bowel sounds present. Soft.  Mild tenderness.  Colostomy bag with fecal content.  No notable blood. EXT:   no edema bilaterally.  Appropriately warm to touch. SKIN: no apparent skin lesion.  NEURO: Awake, alert and oriented appropriately.  No gross deficit.  PSYCH: Calm. Normal affect.  Data Reviewed: I have independently reviewed following labs and imaging studies  CBC: Recent Labs  Lab 12/29/18 0400 12/30/18 0319  01/01/19 0447 01/02/19 0518 01/02/19 1212 01/03/19 0259 01/04/19 0309  WBC 5.3 6.8   < > 12.3* 10.0 9.8 11.9* 14.3*  NEUTROABS 3.3 4.6  --   --   --   --   --   --   HGB 9.5* 9.3*   < > 10.1* 8.2* 8.7* 8.2* 7.6*  HCT 31.1* 30.0*   < > 33.7* 27.6* 29.4* 26.8* 25.9*  MCV 96.0 96.8   < > 98.8 98.9 100.0 97.8 99.2  PLT 141*  128*   < > 133* 127* 134* 136* 152   < > = values in this interval not displayed.   Basic Metabolic Panel: Recent Labs  Lab 12/30/18 0319 12/31/18 0337 01/01/19 0447 01/02/19 0518 01/03/19 0259 01/04/19 0309  NA 152* 151* 146* 143 139 140  K 3.2* 3.1* 4.1 4.0 4.5 5.0  CL 109 108 105 101 96* 98  CO2 31 35* 33* 34* 34* 36*  GLUCOSE 104* 258* 214* 181* 290* 108*  BUN 46* 36* 31* 25* 27* 30*  CREATININE 1.10* 0.85 0.68 0.61 0.74 0.77  CALCIUM 7.4* 7.7* 7.8* 7.4* 7.4* 7.6*  MG 2.7* 2.4 2.0 1.4* 1.7  --   PHOS 3.3 2.0* 2.3* 3.1 3.4  --    GFR: Estimated Creatinine Clearance: 64.5 mL/min (by C-G formula based on SCr of 0.77 mg/dL). Liver Function Tests: Recent Labs  Lab 12/30/18 0319 12/31/18 0337 01/02/19 0518  AST 21 20  --   ALT 23 20  --   ALKPHOS 87 84  --   BILITOT 0.9 0.6  --   PROT 4.4* 4.7*  --    ALBUMIN 1.5* 1.5* 1.7*   No results for input(s): LIPASE, AMYLASE in the last 168 hours. No results for input(s): AMMONIA in the last 168 hours. Coagulation Profile: No results for input(s): INR, PROTIME in the last 168 hours. Cardiac Enzymes: No results for input(s): CKTOTAL, CKMB, CKMBINDEX, TROPONINI in the last 168 hours. BNP (last 3 results) No results for input(s): PROBNP in the last 8760 hours. HbA1C: No results for input(s): HGBA1C in the last 72 hours. CBG: Recent Labs  Lab 01/02/19 2256 01/03/19 0809 01/03/19 1453 01/03/19 1635 01/03/19 2144  GLUCAP 282* 251* 172* 183* 107*   Lipid Profile: No results for input(s): CHOL, HDL, LDLCALC, TRIG, CHOLHDL, LDLDIRECT in the last 72 hours. Thyroid Function Tests: Recent Labs    01/03/19 0259  TSH 1.894  FREET4 0.93   Anemia Panel: Recent Labs    01/04/19 0833  VITAMINB12 1,083*  FOLATE 34.0  FERRITIN 859*  TIBC 126*  IRON 11*  RETICCTPCT 1.9   Urine analysis:    Component Value Date/Time   COLORURINE YELLOW 01/01/2019 1103   APPEARANCEUR CLEAR 01/01/2019 1103   LABSPEC 1.010 01/01/2019 1103   PHURINE 5.5 01/01/2019 1103   GLUCOSEU NEGATIVE 01/01/2019 1103   HGBUR SMALL (A) 01/01/2019 1103   BILIRUBINUR NEGATIVE 01/01/2019 1103   KETONESUR NEGATIVE 01/01/2019 1103   PROTEINUR NEGATIVE 01/01/2019 1103   UROBILINOGEN 0.2 03/25/2015 2317   NITRITE NEGATIVE 01/01/2019 1103   LEUKOCYTESUR NEGATIVE 01/01/2019 1103   Sepsis Labs: Invalid input(s): PROCALCITONIN, LACTICIDVEN  Recent Results (from the past 240 hour(s))  Surgical pcr screen     Status: None   Collection Time: 12/26/18  8:53 PM  Result Value Ref Range Status   MRSA, PCR NEGATIVE NEGATIVE Final   Staphylococcus aureus NEGATIVE NEGATIVE Final    Comment: (NOTE) The Xpert SA Assay (FDA approved for NASAL specimens in patients 52 years of age and older), is one component of a comprehensive surveillance program. It is not intended to diagnose  infection nor to guide or monitor treatment. Performed at [facility], [place] [address], [place], [place] [address]   Culture, Urine     Status: Abnormal   Collection Time: 01/01/19 11:03 AM  Result Value Ref Range Status   Specimen Description   Final    URINE, CATHETERIZED Performed at [facility] [address] [place], [place]  [address]    Special Requests   Final    NONE Performed at [facility], [place] [address] [place], [place] [address]    Culture >=100,000 COLONIES/mL ENTEROCOCCUS FAECALIS (A)  Final   Report Status 01/03/2019 FINAL  Final   Organism ID, Bacteria ENTEROCOCCUS FAECALIS (A)  Final      Susceptibility   Enterococcus faecalis - MIC*    AMPICILLIN <=2 SENSITIVE Sensitive     LEVOFLOXACIN 1 SENSITIVE Sensitive     NITROFURANTOIN <=16 SENSITIVE Sensitive     VANCOMYCIN 1 SENSITIVE Sensitive     * >=100,000 COLONIES/mL ENTEROCOCCUS FAECALIS      Radiology Studies: No results found.  Taye T. [facility] Pager [phone]  If 7PM-7AM, please contact night-coverage [email or web address] Password [email or web address] 01/04/2019, 11:22 AM

## 2019-01-05 ENCOUNTER — Encounter (HOSPITAL_COMMUNITY): Payer: Self-pay

## 2019-01-05 ENCOUNTER — Inpatient Hospital Stay (HOSPITAL_COMMUNITY): Payer: Medicare HMO

## 2019-01-05 LAB — CBC
HCT: 31.1 % — ABNORMAL LOW (ref 36.0–46.0)
Hemoglobin: 9.2 g/dL — ABNORMAL LOW (ref 12.0–15.0)
MCH: 29.2 pg (ref 26.0–34.0)
MCHC: 29.6 g/dL — ABNORMAL LOW (ref 30.0–36.0)
MCV: 98.7 fL (ref 80.0–100.0)
Platelets: 206 10*3/uL (ref 150–400)
RBC: 3.15 MIL/uL — ABNORMAL LOW (ref 3.87–5.11)
RDW: 17.2 % — ABNORMAL HIGH (ref 11.5–15.5)
WBC: 15.1 10*3/uL — AB (ref 4.0–10.5)
nRBC: 0 % (ref 0.0–0.2)

## 2019-01-05 LAB — BASIC METABOLIC PANEL
Anion gap: 11 (ref 5–15)
BUN: 37 mg/dL — ABNORMAL HIGH (ref 8–23)
CO2: 32 mmol/L (ref 22–32)
Calcium: 7.7 mg/dL — ABNORMAL LOW (ref 8.9–10.3)
Chloride: 99 mmol/L (ref 98–111)
Creatinine, Ser: 0.96 mg/dL (ref 0.44–1.00)
GFR calc non Af Amer: 59 mL/min — ABNORMAL LOW (ref >60)
Glucose, Bld: 103 mg/dL — ABNORMAL HIGH (ref 70–99)
Potassium: 4.7 mmol/L (ref 3.5–5.1)
Sodium: 142 mmol/L (ref 135–145)

## 2019-01-05 LAB — GLUCOSE, CAPILLARY
GLUCOSE-CAPILLARY: 115 mg/dL — AB (ref 70–99)
Glucose-Capillary: 95 mg/dL (ref 70–99)
Glucose-Capillary: 119 mg/dL — ABNORMAL HIGH (ref 70–99)

## 2019-01-05 LAB — MAGNESIUM: Magnesium: 1.8 mg/dL (ref 1.7–2.4)

## 2019-01-05 MED ORDER — IOHEXOL 300 MG/ML  SOLN
100.0000 mL | Freq: Once | INTRAMUSCULAR | Status: AC | PRN
  Administered 2019-01-05: 100 mL via INTRAVENOUS

## 2019-01-05 MED ORDER — SODIUM CHLORIDE (PF) 0.9 % IJ SOLN
INTRAMUSCULAR | Status: AC
  Administered 2019-01-05: 14:00:00
  Filled 2019-01-05: qty 50

## 2019-01-05 NOTE — Progress Notes (Signed)
PROGRESS NOTE  Cassandra Allen IZT:[id number] DOB: 12-Dec-1945 DOA: 12/25/2018 PCP: Cassandra Jordan, MD   LOS: 10 days   Brief Narrative / Interim history: 73 year old female with history of newly diagnosed colon cancer on 10/25/2018 with metastasis started on FOLFOX biweekly on 12/04/2018, iron deficiency anemia, DM 2, hypertension, CKD and atrial fibrillation on anticoagulation who presented with abdominal pain and found to have obstructing colon mass.  Patient had laparotomy with resection of sigmoid colon, descending colon and distal transverse colon, and colostomy.  Pathology showed moderately differentiated adenocarcinoma.  Hospitalization complicated by hypotension, anemia, and A. fib with RVR for which she was transferred to stepdown unit on 2/13.  Started on metoprolol IV and heparin.  Heart rate improved to 80s and 90s.  Transitioned to metoprolol 25 mg twice daily and Eliquis.  Subjective: No major events overnight of this morning.  Lost her tooth (crown) this morning while eating breakfast.  Denies pain, difficulty breathing, nausea, vomiting or dysuria.   Assessment & Plan: Principal Problem:   Colonic obstruction due to metastatic colon cancer Active Problems:   Essential (primary) hypertension   Diabetes mellitus without complication (HCC)   AKI (acute kidney injury) (Cassandra Allen)   Iron deficiency anemia   Cancer of splenic flexure of colon   Atrial fibrillation, chronic   Anticoagulant long-term use   Cancer of sigmoid colon    History of adenomatous polyp of colon   Acute diastolic heart failure (HCC)   Liver metastases from liver cancer   CKD (chronic kidney disease) stage 3, GFR 30-59 ml/min (HCC)   Obesity (BMI 30-39.9)   Choledocholithiasis by MRCP   Status post partial colectomy Feb 2020  Obstructive colonic mass: Status post laparotomy, sigmoid, descending colon and distal transverse colon resection with transverse colostomy on 12/27/2018.  Pathology showed invasive  moderately differentiated adenocarcinoma.  Also concern about liver metastasis. -General surgery on board. -Defer pain management to general surgery. -Cefotetan 2/13 to 2/16. -TPN 2/17-2/21 -Tolerating soft diet.  -No plan for chemo in the near future until she recovers per oncology, Cassandra Allen. -Out of bed x3 a day for meals. -Encourage incentive spirometry. -Wean off oxygen. -PT/OT--SNF -Encourage incentive spirometry  Mild leukocytosis: Likely hemoconcentration.  Afebrile.  CXR without pneumonia.  Urinalysis negative.  -Continue monitoring -Holding Lasix.  A. fib with RVR: Now rate controlled.  [name] vascular score 5. -Continue metoprolol 30 mg twice daily -Oral digoxin 0.125 mg daily -Continue home Eliquis. -Check digoxin level  Diastolic CHF exacerbation: Echo on 12/23/2018 with EF of 60 to 65% but no other significant structural functional abnormalities.  Patient with tachypnea.  Chest x-ray with cardiomegaly and pulmonary vascular congestion.  Good urine output -Hold Lasix.  Serum creatinine trended up. -Daily weight, intake output and renal function.  Hypernatremia: Resolved. -Continue monitoring  Hypomagnesemia: due to diuretics?  Improved. -Replace and recheck.  Hypertension: Normotensive -Metoprolol and Lasix as above -PRN hydralazine  Mild thrombocytopenia: Stable.  Unlikely HIT given timing also heparin-induced platelet antibody slightly positive. -Continue monitoring -May be able to transition to Eliquis now she is tolerating clear liquids by mouth  Anemia secondary to GI bleed/chronic disease: Hgb 10--> 8.2-->--> 7.6.  No obvious source of bleeding but she is on Eliquis. -Transfuse 1 unit -Anemia profile -Posttransfusion H&H and CBC in the morning  DM2: CBG fairly controlled. -Start Lantus 10 units twice daily -Continue SSI-high  Enterococcus UTI/bacteriuria: sensitive to ampicillin. -Amoxicillin 2/21-->  Scheduled Meds: . acetaminophen  650 mg Oral  Q6H  . amoxicillin  500 mg Oral Q8H  . apixaban  5 mg Oral BID  . chlorhexidine  15 mL Mouth Rinse BID  . digoxin  0.125 mg Oral Daily  . feeding supplement (ENSURE ENLIVE)  237 mL Oral BID BM  . feeding supplement (PRO-STAT SUGAR FREE 64)  30 mL Oral BID  . insulin aspart  0-20 Units Subcutaneous TID WC  . insulin aspart  0-5 Units Subcutaneous QHS  . insulin glargine  10 Units Subcutaneous BID  . lip balm  1 application Topical BID  . mouth rinse  15 mL Mouth Rinse q12n4p  . metoprolol tartrate  25 mg Oral BID  . oxybutynin  20 mg Oral Daily  . pantoprazole  40 mg Oral Daily  . sertraline  100 mg Oral Daily  . sodium chloride flush  10-40 mL Intracatheter Q12H   Continuous Infusions:  PRN Meds:.hydrALAZINE, menthol-cetylpyridinium, ondansetron (ZOFRAN) IV, oxyCODONE, prochlorperazine  DVT prophylaxis: On Eliquis for atrial fibrillation. Code Status: Full code Family Communication: None at bedside Disposition Plan: remains inpatient.  May be transferred to SNF once she is off TPN  Consultants:   General surgery  Oncology  Procedures:   Laparotomy, sigmoid, descending and distal transverse colon resection, Hartmann pouch  Antimicrobials:  Cefotetan 2/13 to 2/16  Objective: Vitals:   01/04/19 1549 01/04/19 1831 01/04/19 2123 01/05/19 0504  BP: (!) 117/55 (!) 108/58 (!) 115/58 (!) 103/59  Pulse: 89 81 88 82  Resp: 17 15 20 18   Temp: 99.1 F (37.3 C) 98.9 F (37.2 C) 98.3 F (36.8 C) 98.4 F (36.9 C)  TempSrc: Oral Oral  Oral  SpO2: 98% 100% 99% 96%  Weight:    80.9 kg  Height:        Intake/Output Summary (Last 24 hours) at 01/05/2019 1010 Last data filed at 01/05/2019 0500 Gross per 24 hour  Intake 555 ml  Output 900 ml  Net -345 ml   Filed Weights   01/03/19 0450 01/04/19 0500 01/05/19 0504  Weight: 80.7 kg 82.1 kg 80.9 kg    Examination:  GENERAL: Appears well. No acute distress.  HEENT: MMM.  Vision and Hearing grossly intact.  NECK: Supple.   No JVD.  LUNGS:  No IWOB. Poor inspiratory effort.  Mild rhonchi. HEART: Irregular rhythm.  Normal rate. Heart sounds normal.  ABD: Bowel sounds present. Soft. Non tender.  Colostomy bag with fecal content. EXT:   no edema bilaterally.  SKIN: no apparent skin lesion.  NEURO: Awake, alert and oriented appropriately.  No gross deficit.  PSYCH: Calm. Normal affect.  Data Reviewed: I have independently reviewed following labs and imaging studies  CBC: Recent Labs  Lab 12/30/18 0319  01/02/19 0518 01/02/19 1212 01/03/19 0259 01/04/19 0309 01/04/19 2056 01/05/19 0356  WBC 6.8   < > 10.0 9.8 11.9* 14.3*  --  15.1*  NEUTROABS 4.6  --   --   --   --   --   --   --   HGB 9.3*   < > 8.2* 8.7* 8.2* 7.6* 9.0* 9.2*  HCT 30.0*   < > 27.6* 29.4* 26.8* 25.9* 29.1* 31.1*  MCV 96.8   < > 98.9 100.0 97.8 99.2  --  98.7  PLT 128*   < > 127* 134* 136* 152  --  206   < > = values in this interval not displayed.   Basic Metabolic Panel: Recent Labs  Lab 12/30/18 0319 12/31/18 5852 01/01/19 7782 01/02/19 0518 01/03/19 0259 01/04/19 0309 01/05/19 0356  NA 152* 151* 146* 143 139 140 142  K 3.2* 3.1* 4.1 4.0 4.5 5.0 4.7  CL 109 108 105 101 96* 98 99  CO2 31 35* 33* 34* 34* 36* 32  GLUCOSE 104* 258* 214* 181* 290* 108* 103*  BUN 46* 36* 31* 25* 27* 30* 37*  CREATININE 1.10* 0.85 0.68 0.61 0.74 0.77 0.96  CALCIUM 7.4* 7.7* 7.8* 7.4* 7.4* 7.6* 7.7*  MG 2.7* 2.4 2.0 1.4* 1.7  --  1.8  PHOS 3.3 2.0* 2.3* 3.1 3.4  --   --    GFR: Estimated Creatinine Clearance: 53.4 mL/min (by C-G formula based on SCr of 0.96 mg/dL). Liver Function Tests: Recent Labs  Lab 12/30/18 0319 12/31/18 0337 01/02/19 0518  AST 21 20  --   ALT 23 20  --   ALKPHOS 87 84  --   BILITOT 0.9 0.6  --   PROT 4.4* 4.7*  --   ALBUMIN 1.5* 1.5* 1.7*   No results for input(s): LIPASE, AMYLASE in the last 168 hours. No results for input(s): AMMONIA in the last 168 hours. Coagulation Profile: No results for input(s): INR,  PROTIME in the last 168 hours. Cardiac Enzymes: No results for input(s): CKTOTAL, CKMB, CKMBINDEX, TROPONINI in the last 168 hours. BNP (last 3 results) No results for input(s): PROBNP in the last 8760 hours. HbA1C: No results for input(s): HGBA1C in the last 72 hours. CBG: Recent Labs  Lab 01/03/19 1635 01/03/19 2144 01/04/19 1126 01/04/19 1623 01/04/19 2122  GLUCAP 183* 107* 111* 118* 121*   Lipid Profile: No results for input(s): CHOL, HDL, LDLCALC, TRIG, CHOLHDL, LDLDIRECT in the last 72 hours. Thyroid Function Tests: Recent Labs    01/03/19 0259  TSH 1.894  FREET4 0.93   Anemia Panel: Recent Labs    01/04/19 0833  VITAMINB12 1,083*  FOLATE 34.0  FERRITIN 859*  TIBC 126*  IRON 11*  RETICCTPCT 1.9   Urine analysis:    Component Value Date/Time   COLORURINE YELLOW 01/01/2019 1103   APPEARANCEUR CLEAR 01/01/2019 1103   LABSPEC 1.010 01/01/2019 1103   PHURINE 5.5 01/01/2019 1103   GLUCOSEU NEGATIVE 01/01/2019 1103   HGBUR SMALL (A) 01/01/2019 1103   BILIRUBINUR NEGATIVE 01/01/2019 1103   KETONESUR NEGATIVE 01/01/2019 1103   PROTEINUR NEGATIVE 01/01/2019 1103   UROBILINOGEN 0.2 03/25/2015 2317   NITRITE NEGATIVE 01/01/2019 1103   LEUKOCYTESUR NEGATIVE 01/01/2019 1103   Sepsis Labs: Invalid input(s): PROCALCITONIN, LACTICIDVEN  Recent Results (from the past 240 hour(s))  Surgical pcr screen     Status: None   Collection Time: 12/26/18  8:53 PM  Result Value Ref Range Status   MRSA, PCR NEGATIVE NEGATIVE Final   Staphylococcus aureus NEGATIVE NEGATIVE Final    Comment: (NOTE) The Xpert SA Assay (FDA approved for NASAL specimens in patients 4 years of age and older), is one component of a comprehensive surveillance program. It is not intended to diagnose infection nor to guide or monitor treatment. Performed at [facility], [place] [address], [place], [place] [address]   Culture, Urine     Status: Abnormal   Collection Time:  01/01/19 11:03 AM  Result Value Ref Range Status   Specimen Description   Final    URINE, CATHETERIZED Performed at [place] [address], [place], [place] [address]    Special Requests   Final    NONE Performed at [facility], [place] [address], [place], [place] [address]    Culture >=100,000  COLONIES/mL ENTEROCOCCUS FAECALIS (A)  Final   Report Status 01/03/2019 FINAL  Final   Organism ID, Bacteria ENTEROCOCCUS FAECALIS (A)  Final      Susceptibility   Enterococcus faecalis - MIC*    AMPICILLIN <=2 SENSITIVE Sensitive     LEVOFLOXACIN 1 SENSITIVE Sensitive     NITROFURANTOIN <=16 SENSITIVE Sensitive     VANCOMYCIN 1 SENSITIVE Sensitive     * >=100,000 COLONIES/mL ENTEROCOCCUS FAECALIS      Radiology Studies: No results found.  Taye T. [facility] Pager [phone]  If 7PM-7AM, please contact night-coverage [email or web address] Password [email or web address] 01/05/2019, 10:10 AM

## 2019-01-05 NOTE — Progress Notes (Signed)
9 Days Post-Op   Subjective/Chief Complaint: No complaints Denies much abdominal pain Good ostomy output   Objective: Vital signs in last 24 hours: Temp:  [98.3 F (36.8 C)-99.1 F (37.3 C)] 98.4 F (36.9 C) (02/23 0504) Pulse Rate:  [81-97] 82 (02/23 0504) Resp:  [15-20] 18 (02/23 0504) BP: (103-122)/(55-61) 103/59 (02/23 0504) SpO2:  [95 %-100 %] 96 % (02/23 0504) Weight:  [80.9 kg] 80.9 kg (02/23 0504) Last BM Date: 01/02/19  Intake/Output from previous day: 02/22 0701 - 02/23 0700 In: 675 [P.O.:360; Blood:315] Out: 900 [Urine:100; Stool:800] Intake/Output this shift: No intake/output data recorded.  Exam: Awake and alert Abdomen soft, ostomy productive Incision/wound clean  Lab Results:  Recent Labs    01/04/19 0309 01/04/19 2056 01/05/19 0356  WBC 14.3*  --  15.1*  HGB 7.6* 9.0* 9.2*  HCT 25.9* 29.1* 31.1*  PLT 152  --  206   BMET Recent Labs    01/04/19 0309 01/05/19 0356  NA 140 142  K 5.0 4.7  CL 98 99  CO2 36* 32  GLUCOSE 108* 103*  BUN 30* 37*  CREATININE 0.77 0.96  CALCIUM 7.6* 7.7*   PT/INR No results for input(s): LABPROT, INR in the last 72 hours. ABG No results for input(s): PHART, HCO3 in the last 72 hours.  Invalid input(s): PCO2, PO2  Studies/Results: No results found.  Anti-infectives: Anti-infectives (From admission, onward)   Start     Dose/Rate Route Frequency Ordered Stop   01/03/19 1000  amoxicillin (AMOXIL) capsule 500 mg     500 mg Oral Every 8 hours 01/03/19 0835     01/03/19 0800  ampicillin-sulbactam (UNASYN) 1.5 g in sodium chloride 0.9 % 100 mL IVPB  Status:  Discontinued     1.5 g 200 mL/hr over 30 Minutes Intravenous Every 6 hours 01/03/19 0738 01/03/19 0834   12/29/18 1000  cefoTEtan (CEFOTAN) 2 g in sodium chloride 0.9 % 100 mL IVPB  Status:  Discontinued     2 g 200 mL/hr over 30 Minutes Intravenous Every 24 hours 12/28/18 1654 12/29/18 0945   12/29/18 1000  cefoTEtan (CEFOTAN) 2 g in sodium chloride  0.9 % 100 mL IVPB  Status:  Discontinued     2 g 200 mL/hr over 30 Minutes Intravenous Every 12 hours 12/29/18 0945 12/30/18 0738   12/27/18 2200  cefoTEtan (CEFOTAN) 2 g in sodium chloride 0.9 % 100 mL IVPB  Status:  Discontinued     2 g 200 mL/hr over 30 Minutes Intravenous Every 12 hours 12/27/18 1449 12/28/18 1654   12/27/18 1039  sodium chloride 0.9 % with cefoTEtan (CEFOTAN) ADS Med    Note to Pharmacy:  Chrystine Oiler: cabinet override      12/27/18 1039 12/27/18 1130   12/26/18 1230  cefoTEtan (CEFOTAN) 2 g in sodium chloride 0.9 % 100 mL IVPB     2 g 200 mL/hr over 30 Minutes Intravenous On call to O.R. 12/26/18 1224 12/27/18 0559      Assessment/Plan: s/p Procedure(s): LEFT SIGMOID COLECTOMY WITH HARTMANN POUCH AND END COLOSTOMY (N/A)  WBC higher Urine cultures positive for Enterococcus on 2/19. Amoxicillin started 2 days ago. Will CT her abdomen/pelvis with WBC still going up to rule out intra-abdominal abscess      LOS: 10 days    Cassandra Allen 01/05/2019

## 2019-01-06 ENCOUNTER — Ambulatory Visit: Payer: Self-pay | Admitting: Adult Health

## 2019-01-06 DIAGNOSIS — E119 Type 2 diabetes mellitus without complications: Secondary | ICD-10-CM

## 2019-01-06 DIAGNOSIS — C186 Malignant neoplasm of descending colon: Secondary | ICD-10-CM

## 2019-01-06 DIAGNOSIS — C799 Secondary malignant neoplasm of unspecified site: Secondary | ICD-10-CM

## 2019-01-06 DIAGNOSIS — I482 Chronic atrial fibrillation, unspecified: Secondary | ICD-10-CM

## 2019-01-06 DIAGNOSIS — Z794 Long term (current) use of insulin: Secondary | ICD-10-CM

## 2019-01-06 DIAGNOSIS — D500 Iron deficiency anemia secondary to blood loss (chronic): Secondary | ICD-10-CM

## 2019-01-06 LAB — BPAM RBC
Blood Product Expiration Date: 202003152359
ISSUE DATE / TIME: 202002221518
Unit Type and Rh: 6200

## 2019-01-06 LAB — TYPE AND SCREEN
ABO/RH(D): A POS
Antibody Screen: NEGATIVE
UNIT DIVISION: 0

## 2019-01-06 LAB — BASIC METABOLIC PANEL
Anion gap: 6 (ref 5–15)
BUN: 34 mg/dL — ABNORMAL HIGH (ref 8–23)
CALCIUM: 7.6 mg/dL — AB (ref 8.9–10.3)
CO2: 35 mmol/L — ABNORMAL HIGH (ref 22–32)
Chloride: 100 mmol/L (ref 98–111)
Creatinine, Ser: 0.8 mg/dL (ref 0.44–1.00)
GFR calc Af Amer: >60 mL/min (ref >60)
Glucose, Bld: 109 mg/dL — ABNORMAL HIGH (ref 70–99)
Potassium: 4.3 mmol/L (ref 3.5–5.1)
Sodium: 141 mmol/L (ref 135–145)

## 2019-01-06 LAB — CBC
HEMATOCRIT: 29.4 % — AB (ref 36.0–46.0)
Hemoglobin: 8.9 g/dL — ABNORMAL LOW (ref 12.0–15.0)
MCH: 29.5 pg (ref 26.0–34.0)
MCHC: 30.3 g/dL (ref 30.0–36.0)
MCV: 97.4 fL (ref 80.0–100.0)
PLATELETS: 238 10*3/uL (ref 150–400)
RBC: 3.02 MIL/uL — ABNORMAL LOW (ref 3.87–5.11)
RDW: 16.5 % — AB (ref 11.5–15.5)
WBC: 14.4 10*3/uL — ABNORMAL HIGH (ref 4.0–10.5)
nRBC: 0 % (ref 0.0–0.2)

## 2019-01-06 LAB — GLUCOSE, CAPILLARY
GLUCOSE-CAPILLARY: 74 mg/dL (ref 70–99)
Glucose-Capillary: 107 mg/dL — ABNORMAL HIGH (ref 70–99)
Glucose-Capillary: 75 mg/dL (ref 70–99)
Glucose-Capillary: 77 mg/dL (ref 70–99)
Glucose-Capillary: 148 mg/dL — ABNORMAL HIGH (ref 70–99)
Glucose-Capillary: 85 mg/dL (ref 70–99)

## 2019-01-06 LAB — MAGNESIUM: Magnesium: 1.7 mg/dL (ref 1.7–2.4)

## 2019-01-06 MED ORDER — PRO-STAT SUGAR FREE PO LIQD
30.0000 mL | Freq: Three times a day (TID) | ORAL | Status: DC
  Administered 2019-01-06 – 2019-01-10 (×13): 30 mL via ORAL
  Filled 2019-01-06 (×13): qty 30

## 2019-01-06 MED ORDER — ENSURE ENLIVE PO LIQD
237.0000 mL | ORAL | Status: DC
  Administered 2019-01-07 – 2019-01-10 (×3): 237 mL via ORAL

## 2019-01-06 NOTE — Progress Notes (Signed)
Ernest Orr Mcduffee   DOB:[birth year]   IH#:[id number]   OVF#:[id number]  Oncology follow-up note  Subjective: Pt was transferred to 6 E. she was sitting in the recliner chair when I saw her, and stated that she had excruciating pain when she was transferred from bed to recliner, " I thought I was going to die."  She is very frustrated.  She was not able to participate in physical therapy due to her fatigue and pain.  She is on soft diet, bowel movement has been normal.  Vital signs stable, afebrile.  Objective:  Vitals:   01/06/19 0628 01/06/19 1259  BP: 139/77 125/72  Pulse: 91 (!) 101  Resp: 18 18  Temp: 98.6 F (37 C) 98 F (36.7 C)  SpO2: 97% 99%    Body mass index is 31.83 kg/m.  Intake/Output Summary (Last 24 hours) at 01/06/2019 1554 Last data filed at 01/06/2019 0843 Gross per 24 hour  Intake 610 ml  Output 1200 ml  Net -590 ml     Sclerae unicteric  Oropharynx clear  No peripheral adenopathy  Lungs clear -- no rales or rhonchi  Heart regular rate and rhythm  Abdomen soft, with diffuse tenderness, incision appears to be clean   MSK no focal spinal tenderness, no peripheral edema  Neuro nonfocal    CBG (last 3)  Recent Labs    01/05/19 1923 01/06/19 0722 01/06/19 1141  GLUCAP 119* 77 148*     Labs:  Lab Results  Component Value Date   WBC 14.4 (H) 01/06/2019   HGB 8.9 (L) 01/06/2019   HCT 29.4 (L) 01/06/2019   MCV 97.4 01/06/2019   PLT 238 01/06/2019   NEUTROABS 4.6 12/30/2018   CMP Latest Ref Rng & Units 01/06/2019 01/05/2019 01/04/2019  Glucose 70 - 99 mg/dL 109(H) 103(H) 108(H)  BUN 8 - 23 mg/dL 34(H) 37(H) 30(H)  Creatinine 0.44 - 1.00 mg/dL 0.80 0.96 0.77  Sodium 135 - 145 mmol/L 141 142 140  Potassium 3.5 - 5.1 mmol/L 4.3 4.7 5.0  Chloride 98 - 111 mmol/L 100 99 98  CO2 22 - 32 mmol/L 35(H) 32 36(H)  Calcium 8.9 - 10.3 mg/dL 7.6(L) 7.7(L) 7.6(L)  Total Protein 6.5 - 8.1 g/dL - - -  Total Bilirubin 0.3 - 1.2 mg/dL - - -  Alkaline Phos 38 - 126  U/L - - -  AST 15 - 41 U/L - - -  ALT 0 - 44 U/L - - -     Urine Studies No results for input(s): UHGB, CRYS in the last 72 hours.  Invalid input(s): UACOL, UAPR, USPG, UPH, UTP, UGL, UKET, UBIL, UNIT, UROB, [place], UEPI, UWBC, Junie Panning [place], [place], [place]  Basic Metabolic Panel: Recent Labs  Lab 12/31/18 [phone] 01/01/19 0447 01/02/19 0518 01/03/19 0259 01/04/19 0309 01/05/19 0356 01/06/19 0352  NA 151* 146* 143 139 140 142 141  K 3.1* 4.1 4.0 4.5 5.0 4.7 4.3  CL 108 105 101 96* 98 99 100  CO2 35* 33* 34* 34* 36* 32 35*  GLUCOSE 258* 214* 181* 290* 108* 103* 109*  BUN 36* 31* 25* 27* 30* 37* 34*  CREATININE 0.85 0.68 0.61 0.74 0.77 0.96 0.80  CALCIUM 7.7* 7.8* 7.4* 7.4* 7.6* 7.7* 7.6*  MG 2.4 2.0 1.4* 1.7  --  1.8 1.7  PHOS 2.0* 2.3* 3.1 3.4  --   --   --    GFR Estimated Creatinine Clearance: 64.2 mL/min (by C-G formula based on SCr of 0.8 mg/dL). Liver Function  Tests: Recent Labs  Lab 12/31/18 0337 01/02/19 0518  AST 20  --   ALT 20  --   ALKPHOS 84  --   BILITOT 0.6  --   PROT 4.7*  --   ALBUMIN 1.5* 1.7*   No results for input(s): LIPASE, AMYLASE in the last 168 hours. No results for input(s): AMMONIA in the last 168 hours. Coagulation profile No results for input(s): INR, PROTIME in the last 168 hours.  CBC: Recent Labs  Lab 01/02/19 1212 01/03/19 0259 01/04/19 0309 01/04/19 2056 01/05/19 0356 01/06/19 0352  WBC 9.8 11.9* 14.3*  --  15.1* 14.4*  HGB 8.7* 8.2* 7.6* 9.0* 9.2* 8.9*  HCT 29.4* 26.8* 25.9* 29.1* 31.1* 29.4*  MCV 100.0 97.8 99.2  --  98.7 97.4  PLT 134* 136* 152  --  206 238   Cardiac Enzymes: No results for input(s): CKTOTAL, CKMB, CKMBINDEX, TROPONINI in the last 168 hours. BNP: Invalid input(s): POCBNP CBG: Recent Labs  Lab 01/05/19 1150 01/05/19 1658 01/05/19 1923 01/06/19 0722 01/06/19 1141  GLUCAP 115* 95 119* 77 148*   D-Dimer No results for input(s): DDIMER in the last 72 hours. Hgb A1c No results for input(s):  HGBA1C in the last 72 hours. Lipid Profile No results for input(s): CHOL, HDL, LDLCALC, TRIG, CHOLHDL, LDLDIRECT in the last 72 hours. Thyroid function studies No results for input(s): TSH, T4TOTAL, T3FREE, THYROIDAB in the last 72 hours.  Invalid input(s): FREET3 Anemia work up Recent Labs    01/04/19 0833  VITAMINB12 1,083*  FOLATE 34.0  FERRITIN 859*  TIBC 126*  IRON 11*  RETICCTPCT 1.9   Microbiology Recent Results (from the past 240 hour(s))  Culture, Urine     Status: Abnormal   Collection Time: 01/01/19 11:03 AM  Result Value Ref Range Status   Specimen Description   Final    URINE, CATHETERIZED Performed at [facility], [place] [address], [place], [name] [address]    Special Requests   Final    NONE Performed at [facility], [place] [address] [place],  [address]    Culture >=100,000 COLONIES/mL ENTEROCOCCUS FAECALIS (A)  Final   Report Status 01/03/2019 FINAL  Final   Organism ID, Bacteria ENTEROCOCCUS FAECALIS (A)  Final      Susceptibility   Enterococcus faecalis - MIC*    AMPICILLIN <=2 SENSITIVE Sensitive     LEVOFLOXACIN 1 SENSITIVE Sensitive     NITROFURANTOIN <=16 SENSITIVE Sensitive     VANCOMYCIN 1 SENSITIVE Sensitive     * >=100,000 COLONIES/mL ENTEROCOCCUS FAECALIS      Studies:  Ct Abdomen Pelvis W Contrast  Result Date: 01/05/2019 CLINICAL DATA:  Postop infection. Sigmoid colectomy with Hartmann's pouch and colostomy. EXAM: CT ABDOMEN AND PELVIS WITH CONTRAST TECHNIQUE: Multidetector CT imaging of the abdomen and pelvis was performed using the standard protocol following bolus administration of intravenous contrast. CONTRAST:  138mL OMNIPAQUE IOHEXOL 300 MG/ML  SOLN COMPARISON:  12/26/2018 FINDINGS: Lower chest: Small bilateral pleural effusions. Bibasilar atelectasis. Heart is mildly enlarged. Hepatobiliary: Irregular low-density lesion in the caudate lobe measures up to 3.3 cm compatible with the known  metastasis. No biliary ductal dilatation. Possible gallstone noted within the gallbladder. Pancreas: No focal abnormality or ductal dilatation. Spleen: Scattered hypodensities in the spleen, nonspecific. Normal size. Adrenals/Urinary Tract: Left adrenal lesion again noted measuring 3 cm. This is stable. Right adrenal gland is unremarkable. No focal renal abnormality or hydronephrosis. Urinary bladder grossly unremarkable. Stomach/Bowel: Changes of partial colectomy right  lower quadrant ostomy noted. Stomach is decompressed. No evidence of bowel obstruction. Vascular/Lymphatic: Aortic atherosclerosis. No enlarged abdominal or pelvic lymph nodes. Reproductive: Uterus and adnexa unremarkable.  No mass. Other: Moderate free fluid in the abdomen and pelvis. Open midline incision noted. Locules of free air throughout the upper abdominal mesentery/omentum with stranding, presumably postoperative. Musculoskeletal: No acute bony abnormality. IMPRESSION: Postoperative changes from partial colectomy. Right lower quadrant ostomy noted. No evidence of bowel obstruction. There is stranding and locules of gas within the upper abdominal mesentery/omentum, presumably postoperative. Moderate free fluid in the abdomen and pelvis. Small bilateral pleural effusions.  Bibasilar atelectasis. Low-density lesion in the caudate lobe compatible with known metastasis. Scattered hypodensities throughout the spleen, nonspecific. Stable left adrenal nodule/adenoma as seen on prior MRI. Electronically Signed   By: Rolm Baptise M.D.   On: 01/05/2019 15:53    Assessment: 73 y.o. with metastatic colon cancer, iron deficient anemia, admitted for bowel obstruction secondary to colon mass  1.  Bowel obstruction secondary to colon cancer, s/p left hemicolectomy  2.  Atrial fibrillation with rapid ventricular response, rate controlled now, back on Eliquis   4. AKI resolved  5.  Anemia secondary to GI bleeding from the tumor, chemotherapy and  surgery, stable  6.  Metastatic left colon cancer, status post chemotherapy FOLFOX for 2 cycles, last dose 12/18/2018, status post a left hemicolectomy on December 27, 2018 7.  History of diastolic heart failure, recent hospitalization for CHF exacerbation  8.  mild thrombocytopenia after surgery, likely related to her acute illness and surgery, resolved now   9. Malnutrition: off TPN now, on soft diet  10. DM: on insulin  11. Depression: on zoloft    Plan:  -Patient is recovering from surgery slowly. Bowel is working, she is on soft diet now, advanced per surgery  -lab reviewed, mild thrombocytopenia has resolved.  She still has moderate anemia, iron study on January 04, 2019 showed elevated ferritin, low iron and TIBC, consistent with anemia of chronic disease.  Due to her MRI evidence of iron deposit in liver, I will be cautious about IV iron. Consider blood transfusion if Hg<8.0 -I again encouraged her to participate in physical therapy and Occupational Therapy, and encouraged her to consider short-term rehab. -I will only follow-up as needed before discharge, please call me if needed.    Truitt Merle, MD 01/06/2019  3:54 PM

## 2019-01-06 NOTE — Consult Note (Signed)
[place] Nurse ostomy follow up Stoma type/location: RUQ colostomy Stomal assessment/size: 1 3/8", recessed from 3 to 6 o'clock.  Flush otherwise, dark, malodourous Peristomal assessment: Pouch in place had been cut large and exposed skin is denuded from exposure to ostomy effluent.  Treatment options for stomal/peristomal skin: barrier ring and 2 piece flat.  Cut pouch off center to accommodate midline incision. Pattern and additional supplies at bedside.  Output soft brown stool Ostomy pouching: 2pc. 2 1/4" pouch with barrier ring.   Education provided: Patient is minimally participative today.  Does not seem confused, but requests analgesia prior to pouch change.  Discussed emptying when 1/3 full and twice pouch changes Enrolled patient in [place] Start Discharge program: Yes today [place] team will follow.  Domenic Moras MSN, RN, FNP-BC CWON Wound, Ostomy, Continence Nurse Pager [phone]

## 2019-01-06 NOTE — Progress Notes (Signed)
Physical Therapy Treatment Patient Details Name: Cassandra Allen MRN: [id number] DOB: [birth year] Today's Date: 01/06/2019    History of Present Illness 73 year old woman with hx AFib on eliquis, CHF, DM, and newly dx metastatic colon ca 10/25/18 and currently status post exploratory laparotomy with partial colectomy and transverse colostomy on 12/27/18     PT Comments    Pt up in recliner. Attempted sit to stand with total assist but pt was unable to clear hips from recliner. She declined any further attempts, even with +2 assist, due to feeling too fatigued. Instructed pt in BUE/LE strengthening exercises performed in sitting. Pt fatigues very quickly and is quite deconditioned.    Follow Up Recommendations  SNF     Equipment Recommendations  None recommended by PT    Recommendations for Other Services       Precautions / Restrictions Precautions Precautions: Fall Precaution Comments: multiple lines/leads, colostomy,  Restrictions Weight Bearing Restrictions: No    Mobility  Bed Mobility Overal bed mobility: Needs Assistance Bed Mobility: Rolling;Sidelying to Sit Rolling: Mod assist;+2 for physical assistance;+2 for safety/equipment Sidelying to sit: Max assist;+2 for physical assistance;+2 for safety/equipment       General bed mobility comments: cues for technique and physical assist   Transfers Overall transfer level: Needs assistance Equipment used: Rolling walker (2 wheeled) Transfers: Sit to/from [facility] Sit to Stand: Total assist(pt unable to clear hips from recliner with 1 attempt of sit to stand, pt declined further attempts with +2 assist 2* fatigue) Stand pivot transfers: Max assist;+2 physical assistance;+2 safety/equipment;From elevated surface          Ambulation/Gait                 Stairs             Wheelchair Mobility    Modified Rankin (Stroke Patients Only)       Balance Overall balance  assessment: Needs assistance Sitting-balance support: Bilateral upper extremity supported;Feet supported Sitting balance-Leahy Scale: Fair     Standing balance support: Bilateral upper extremity supported;During functional activity Standing balance-Leahy Scale: Poor Standing balance comment: reliant on RW                            Cognition Arousal/Alertness: Awake/alert Behavior During Therapy: WFL for tasks assessed/performed Overall Cognitive Status: Within Functional Limits for tasks assessed                                 General Comments: Pt following cues and responding appropriately to questions but verbalizes minimally      Exercises General Exercises - Lower Extremity Ankle Circles/Pumps: AROM;Both;10 reps;Seated Long Arc Quad: AROM;Both;10 reps;Seated Hip Flexion/Marching: AROM;Both;10 reps;Seated Shoulder Exercises Shoulder Flexion: AROM;Both;10 reps;Seated    General Comments        Pertinent Vitals/Pain Pain Assessment: No/denies pain Faces Pain Scale: Hurts whole lot Pain Location: abdomen with transition side ly to sit and sit to stand Pain Descriptors / Indicators: Aching;Sore;Grimacing Pain Intervention(s): Repositioned;Limited activity within patient's tolerance    Home Living Family/patient expects to be discharged to:: Private residence Living Arrangements: Alone Available Help at Discharge: Family;Available PRN/intermittently Type of Home: Apartment Home Access: Level entry   Home Layout: One level Home Equipment: Cane - single point;Walker - 4 wheels      Prior Function Level of Independence: Independent with assistive device(s)  Comments: cane in apt, rollator in community. Drives. Does her own grocery shopping. (from recent admission - pt flat and not forthcoming with information at time of evaluation)   PT Goals (current goals can now be found in the care plan section) Acute Rehab PT Goals Patient Stated  Goal: get well PT Goal Formulation: With patient Time For Goal Achievement: 01/13/19 Potential to Achieve Goals: Fair Progress towards PT goals: Progressing toward goals    Frequency    Min 2X/week      PT Plan Current plan remains appropriate    Co-evaluation              AM-PAC PT "6 Clicks" Mobility   Outcome Measure  Help needed turning from your back to your side while in a flat bed without using bedrails?: A Lot Help needed moving from lying on your back to sitting on the side of a flat bed without using bedrails?: A Lot Help needed moving to and from a bed to a chair (including a wheelchair)?: A Lot Help needed standing up from a chair using your arms (e.g., wheelchair or bedside chair)?: Total Help needed to walk in hospital room?: Total Help needed climbing 3-5 steps with a railing? : Total 6 Click Score: 9    End of Session Equipment Utilized During Treatment: Gait belt;Oxygen Activity Tolerance: Patient limited by fatigue Patient left: in chair;with call bell/phone within reach;with chair alarm set Nurse Communication: Mobility status;Need for lift equipment PT Visit Diagnosis: Unsteadiness on feet (R26.81);Repeated falls (R29.6);Muscle weakness (generalized) (M62.81);Difficulty in walking, not elsewhere classified (R26.2);Pain     Time: 1223-1239 PT Time Calculation (min) (ACUTE ONLY): 16 min  Charges:  $Therapeutic Exercise: 8-22 mins                     Blondell Reveal Kistler PT 01/06/2019  Acute Rehabilitation Services Pager [phone] Office [phone]

## 2019-01-06 NOTE — Progress Notes (Signed)
PROGRESS NOTE  Cassandra Allen QMG:[id number] DOB: 1946/01/31 DOA: 12/25/2018 PCP: [name], MD   LOS: 11 days   Brief Narrative / Interim history: 73 year old female with history of newly diagnosed colon cancer on 10/25/2018 with metastasis started on FOLFOX biweekly on 12/04/2018, iron deficiency anemia, DM 2, hypertension, CKD and atrial fibrillation on anticoagulation who presented with abdominal pain and found to have obstructing colon mass.  Patient had laparotomy with resection of sigmoid colon, descending colon and distal transverse colon, and colostomy.  Pathology showed moderately differentiated adenocarcinoma.  Hospitalization complicated by hypotension, anemia, and A. fib with RVR for which she was transferred to stepdown unit on 2/13.  Started on metoprolol IV and heparin.  Heart rate improved to 80s and 90s.  Transitioned to metoprolol 25 mg twice daily and Eliquis.  Subjective: No major events overnight of this morning.  Lost her tooth (crown) this morning while eating breakfast.  Denies pain, difficulty breathing, nausea, vomiting or dysuria.   Assessment & Plan: Principal Problem:   Colonic obstruction due to metastatic colon cancer Active Problems:   Essential (primary) hypertension   Diabetes mellitus without complication (HCC)   AKI (acute kidney injury) ([place])   Iron deficiency anemia   Cancer of splenic flexure of colon   Atrial fibrillation, chronic   Anticoagulant long-term use   Cancer of sigmoid colon    History of adenomatous polyp of colon   Acute diastolic heart failure (HCC)   Liver metastases from liver cancer   CKD (chronic kidney disease) stage 3, GFR 30-59 ml/min (HCC)   Obesity (BMI 30-39.9)   Choledocholithiasis by MRCP   Status post partial colectomy Feb 2020  Obstructive colonic mass: Status post laparotomy, sigmoid, descending colon and distal transverse colon resection with transverse colostomy on 12/27/2018.  Pathology showed invasive  moderately differentiated adenocarcinoma.  Also concern about liver metastasis. -General surgery on board. -Defer pain management to general surgery. -Cefotetan 2/13 to 2/16. -TPN 2/17-2/21 -Tolerating soft diet.  -No plan for chemo in the near future until she recovers per oncology, Dr. Burr Medico. -Out of bed x3 a day for meals. -Encourage incentive spirometry. -Wean off oxygen. -PT/OT--SNF -Encourage incentive spirometry  Mild leukocytosis: Improving.  Afebrile.  CXR without pneumonia.  Being treated for enterococcus UTI.  CT abdomen on 2/23 without significant finding. -Continue monitoring -Holding Lasix.  A. fib with RVR: Now rate controlled.  [name] vascular score 5. -Continue metoprolol 30 mg twice daily -Oral digoxin 0.125 mg daily-level therapeutic -Continue home Eliquis.  Diastolic CHF exacerbation: Echo on 12/23/2018 with EF of 60 to 65% but no other significant structural functional abnormalities.  CXR on 2/18 with vascular congestion.  Diuresed well with IV Lasix -Resume home Lasix -Daily weight, intake output and renal function.  Hypernatremia: Resolved. -Continue monitoring  Hypomagnesemia: Resolved. -Replace and recheck.  Hypertension: Normotensive -Metoprolol and Lasix as above -PRN hydralazine  Mild thrombocytopenia: Resolved.  Anemia secondary to GI bleed/chronic disease: Hgb 10--> 8.2-->--> 7.6--> 1 units---8.9. -Continue monitoring  DM2: CBG fairly controlled. -Start Lantus 10 units twice daily -Continue SSI-high  Enterococcus UTI/bacteriuria: sensitive to ampicillin. -Amoxicillin 2/21-->  Scheduled Meds: . acetaminophen  650 mg Oral Q6H  . amoxicillin  500 mg Oral Q8H  . apixaban  5 mg Oral BID  . chlorhexidine  15 mL Mouth Rinse BID  . digoxin  0.125 mg Oral Daily  . [START ON 01/07/2019] feeding supplement (ENSURE ENLIVE)  237 mL Oral Q24H  . feeding supplement (PRO-STAT SUGAR FREE 64)  30  mL Oral TID BM  . insulin aspart  0-20 Units Subcutaneous  TID WC  . insulin aspart  0-5 Units Subcutaneous QHS  . insulin glargine  10 Units Subcutaneous BID  . lip balm  1 application Topical BID  . mouth rinse  15 mL Mouth Rinse q12n4p  . metoprolol tartrate  25 mg Oral BID  . oxybutynin  20 mg Oral Daily  . pantoprazole  40 mg Oral Daily  . sertraline  100 mg Oral Daily  . sodium chloride flush  10-40 mL Intracatheter Q12H   Continuous Infusions:  PRN Meds:.hydrALAZINE, menthol-cetylpyridinium, ondansetron (ZOFRAN) IV, oxyCODONE, prochlorperazine  DVT prophylaxis: On Eliquis for atrial fibrillation. Code Status: Full code Family Communication: None at bedside.  Attempted to call patient (child) update. No answer. Disposition Plan: Anticipate discharge to SNF after insurance authorization.  Consultants:   General surgery  Oncology  Procedures:   Laparotomy, sigmoid, descending and distal transverse colon resection, Hartmann pouch  Antimicrobials:  Cefotetan 2/13 to 2/16  Objective: Vitals:   01/05/19 1422 01/05/19 1926 01/06/19 0628 01/06/19 1259  BP: (!) 126/59 (!) 132/55 139/77 125/72  Pulse: 67 (!) 101 91 (!) 101  Resp: 18 18 18 18   Temp: 98.5 F (36.9 C) 98.8 F (37.1 C) 98.6 F (37 C) 98 F (36.7 C)  TempSrc: Oral Oral Oral Oral  SpO2: 98% 93% 97% 99%  Weight:      Height:        Intake/Output Summary (Last 24 hours) at 01/06/2019 1440 Last data filed at 01/06/2019 0843 Gross per 24 hour  Intake 610 ml  Output 1200 ml  Net -590 ml   Filed Weights   01/04/19 0500 01/05/19 0504 01/05/19 1422  Weight: 82.1 kg 80.9 kg 81.5 kg    Examination:  GENERAL: Appears well. No acute distress.  HEENT: MMM.  Vision and Hearing grossly intact.  NECK: Supple.  No JVD.  LUNGS:  No IWOB. Good air movement. CTAB.  HEART:  RRR. Heart sounds normal.  ABD: Bowel sounds present. Soft.  Mild diffuse tenderness to palpation EXT:   no edema bilaterally.  SKIN: no apparent skin lesion.  NEURO: Awake, alert and oriented  appropriately.  No gross deficit.  PSYCH: Somewhat flat affect  Data Reviewed: I have independently reviewed following labs and imaging studies  CBC: Recent Labs  Lab 01/02/19 1212 01/03/19 0259 01/04/19 0309 01/04/19 2056 01/05/19 0356 01/06/19 0352  WBC 9.8 11.9* 14.3*  --  15.1* 14.4*  HGB 8.7* 8.2* 7.6* 9.0* 9.2* 8.9*  HCT 29.4* 26.8* 25.9* 29.1* 31.1* 29.4*  MCV 100.0 97.8 99.2  --  98.7 97.4  PLT 134* 136* 152  --  206 297   Basic Metabolic Panel: Recent Labs  Lab 12/31/18 0337 01/01/19 0447 01/02/19 0518 01/03/19 0259 01/04/19 0309 01/05/19 0356 01/06/19 0352  NA 151* 146* 143 139 140 142 141  K 3.1* 4.1 4.0 4.5 5.0 4.7 4.3  CL 108 105 101 96* 98 99 100  CO2 35* 33* 34* 34* 36* 32 35*  GLUCOSE 258* 214* 181* 290* 108* 103* 109*  BUN 36* 31* 25* 27* 30* 37* 34*  CREATININE 0.85 0.68 0.61 0.74 0.77 0.96 0.80  CALCIUM 7.7* 7.8* 7.4* 7.4* 7.6* 7.7* 7.6*  MG 2.4 2.0 1.4* 1.7  --  1.8 1.7  PHOS 2.0* 2.3* 3.1 3.4  --   --   --    GFR: Estimated Creatinine Clearance: 64.2 mL/min (by C-G formula based on SCr of 0.8 mg/dL). Liver  Function Tests: Recent Labs  Lab 12/31/18 0337 01/02/19 0518  AST 20  --   ALT 20  --   ALKPHOS 84  --   BILITOT 0.6  --   PROT 4.7*  --   ALBUMIN 1.5* 1.7*   No results for input(s): LIPASE, AMYLASE in the last 168 hours. No results for input(s): AMMONIA in the last 168 hours. Coagulation Profile: No results for input(s): INR, PROTIME in the last 168 hours. Cardiac Enzymes: No results for input(s): CKTOTAL, CKMB, CKMBINDEX, TROPONINI in the last 168 hours. BNP (last 3 results) No results for input(s): PROBNP in the last 8760 hours. HbA1C: No results for input(s): HGBA1C in the last 72 hours. CBG: Recent Labs  Lab 01/05/19 1150 01/05/19 1658 01/05/19 1923 01/06/19 0722 01/06/19 1141  GLUCAP 115* 95 119* 77 148*   Lipid Profile: No results for input(s): CHOL, HDL, LDLCALC, TRIG, CHOLHDL, LDLDIRECT in the last 72  hours. Thyroid Function Tests: No results for input(s): TSH, T4TOTAL, FREET4, T3FREE, THYROIDAB in the last 72 hours. Anemia Panel: Recent Labs    01/04/19 0833  VITAMINB12 1,083*  FOLATE 34.0  FERRITIN 859*  TIBC 126*  IRON 11*  RETICCTPCT 1.9   Urine analysis:    Component Value Date/Time   COLORURINE YELLOW 01/01/2019 1103   APPEARANCEUR CLEAR 01/01/2019 1103   LABSPEC 1.010 01/01/2019 1103   PHURINE 5.5 01/01/2019 1103   GLUCOSEU NEGATIVE 01/01/2019 1103   HGBUR SMALL (A) 01/01/2019 1103   BILIRUBINUR NEGATIVE 01/01/2019 1103   KETONESUR NEGATIVE 01/01/2019 1103   PROTEINUR NEGATIVE 01/01/2019 1103   UROBILINOGEN 0.2 03/25/2015 2317   NITRITE NEGATIVE 01/01/2019 1103   LEUKOCYTESUR NEGATIVE 01/01/2019 1103   Sepsis Labs: Invalid input(s): PROCALCITONIN, LACTICIDVEN  Recent Results (from the past 240 hour(s))  Culture, Urine     Status: Abnormal   Collection Time: 01/01/19 11:03 AM  Result Value Ref Range Status   Specimen Description   Final    URINE, CATHETERIZED Performed at [facility], [place] [address], [place], [place] [address]    Special Requests   Final    NONE Performed at [facility], [place] [address] [place], [place] [address]    Culture >=100,000 COLONIES/mL ENTEROCOCCUS FAECALIS (A)  Final   Report Status 01/03/2019 FINAL  Final   Organism ID, Bacteria ENTEROCOCCUS FAECALIS (A)  Final      Susceptibility   Enterococcus faecalis - MIC*    AMPICILLIN <=2 SENSITIVE Sensitive     LEVOFLOXACIN 1 SENSITIVE Sensitive     NITROFURANTOIN <=16 SENSITIVE Sensitive     VANCOMYCIN 1 SENSITIVE Sensitive     * >=100,000 COLONIES/mL ENTEROCOCCUS FAECALIS      Radiology Studies: No results found.   T. [facility] Pager [phone]  If 7PM-7AM, please contact night-coverage [email or web address] Password [email or web address] 01/06/2019, 2:40 PM

## 2019-01-06 NOTE — Progress Notes (Signed)
Nutrition Follow-up  DOCUMENTATION CODES:   Obesity unspecified  INTERVENTION:  - Will decrease Ensure Enlive to once/day, each supplement provides 350 kcal and 20 grams of protein. - Will increase Prostat to TID, each supplement provides 100 kcal and 15 grams of protein. - Continue to encourage PO intakes.   NUTRITION DIAGNOSIS:   Increased nutrient needs related to post-op healing, cancer and cancer related treatments as evidenced by estimated needs. -ongoing  GOAL:   Patient will meet greater than or equal to 90% of their needs -beginning to meet  MONITOR:   PO intake, Supplement acceptance, Weight trends, Labs, Skin, I & O's  ASSESSMENT:   73 year old woman with hx AFib on eliquis, CHF, newly dx metastatic colon ca 10/25/18 who underwent hemicolectomy for newly obstructing sigmoid cancer.   Weight has been stable over the past week. Diet advanced from CLD to [place] on 2/20 at 8:00 AM and from FLD to Soft on 2/21 at 7:50 AM. Patient consumed 100% of breakfast and dinner and skipped lunch 2/23 (total of 1030 kcal, 38 grams of protein). This morning she consumed 75% of breakfast (480 kcal, 17 grams of protein).   Per review of orders, patient accepted 4 of 9 bottles of Ensure and 7 of 7 packets of Prostat. Will make adjustments to supplements as outlined above.   Per Dr. Marcello Moores' note this AM: patient has good output from ostomy (POD #10 L signmoid colectomy with Hartmann pouch and end colostomy), plan to remove staples today.  Per Dr. Juliann Pares note yesterday AM: pathology showed invasive moderately differentiated adenocarcinoma with concern for liver mets with no plan for chemo in the near future, patient on TPN 2/17-2/21. Disposition plan states that patient "may be transferred to SNF once she is off TPN", which she has been x3 days now.      Medications reviewed; sliding scale novolog, 10 units lantus BID, 40 mg oral protonix/day.  Labs reviewed; CBG: 77 mg/dl today, BUN: 34  mg/dl, Ca: 7.6 mg/dl.    Diet Order:   Diet Order            DIET SOFT Room service appropriate? Yes; Fluid consistency: Thin  Diet effective now              EDUCATION NEEDS:   Not appropriate for education at this time  Skin:  Skin Assessment: Skin Integrity Issues: Skin Integrity Issues:: Incisions Incisions: abdominal (2/14)  Last BM:  2/23--175 ml via colostomy  Height:   Ht Readings from Last 1 Encounters:  12/25/18 5\' 3"  (1.6 m)    Weight:   Wt Readings from Last 1 Encounters:  01/05/19 81.5 kg    Ideal Body Weight:  52.3 kg  BMI:  Body mass index is 31.83 kg/m.  Estimated Nutritional Needs:   Kcal:  1650-1850  Protein:  75-85g  Fluid:  1.8L/day     Jarome Matin, MS, RD, LDN, CNSC Inpatient Clinical Dietitian Pager # [phone] After hours/weekend pager # [phone]

## 2019-01-06 NOTE — Progress Notes (Signed)
10 Days Post-Op   Subjective/Chief Complaint: No complaints Denies much abdominal pain Good ostomy output tolerating a diet    Objective: Vital signs in last 24 hours: Temp:  [98.5 F (36.9 C)-98.8 F (37.1 C)] 98.6 F (37 C) (02/24 0628) Pulse Rate:  [67-101] 91 (02/24 0628) Resp:  [18] 18 (02/24 0628) BP: (115-139)/(51-77) 139/77 (02/24 0628) SpO2:  [86 %-99 %] 97 % (02/24 0628) Weight:  [81.5 kg] 81.5 kg (02/23 1422) Last BM Date: 01/02/19  Intake/Output from previous day: 02/23 0701 - 02/24 0700 In: 7 [P.O.:490] Out: 1375 [Urine:800; Stool:575] Intake/Output this shift: Total I/O In: 360 [P.O.:360] Out: -   Exam: Awake and alert Abdomen soft, ostomy productive Incision/wound clean  Lab Results:  Recent Labs    01/05/19 0356 01/06/19 0352  WBC 15.1* 14.4*  HGB 9.2* 8.9*  HCT 31.1* 29.4*  PLT 206 238   BMET Recent Labs    01/05/19 0356 01/06/19 0352  NA 142 141  K 4.7 4.3  CL 99 100  CO2 32 35*  GLUCOSE 103* 109*  BUN 37* 34*  CREATININE 0.96 0.80  CALCIUM 7.7* 7.6*   PT/INR No results for input(s): LABPROT, INR in the last 72 hours. ABG No results for input(s): PHART, HCO3 in the last 72 hours.  Invalid input(s): PCO2, PO2  Studies/Results: Ct Abdomen Pelvis W Contrast  Result Date: 01/05/2019 CLINICAL DATA:  Postop infection. Sigmoid colectomy with Hartmann's pouch and colostomy. EXAM: CT ABDOMEN AND PELVIS WITH CONTRAST TECHNIQUE: Multidetector CT imaging of the abdomen and pelvis was performed using the standard protocol following bolus administration of intravenous contrast. CONTRAST:  169mL OMNIPAQUE IOHEXOL 300 MG/ML  SOLN COMPARISON:  12/26/2018 FINDINGS: Lower chest: Small bilateral pleural effusions. Bibasilar atelectasis. Heart is mildly enlarged. Hepatobiliary: Irregular low-density lesion in the caudate lobe measures up to 3.3 cm compatible with the known metastasis. No biliary ductal dilatation. Possible gallstone noted within  the gallbladder. Pancreas: No focal abnormality or ductal dilatation. Spleen: Scattered hypodensities in the spleen, nonspecific. Normal size. Adrenals/Urinary Tract: Left adrenal lesion again noted measuring 3 cm. This is stable. Right adrenal gland is unremarkable. No focal renal abnormality or hydronephrosis. Urinary bladder grossly unremarkable. Stomach/Bowel: Changes of partial colectomy right lower quadrant ostomy noted. Stomach is decompressed. No evidence of bowel obstruction. Vascular/Lymphatic: Aortic atherosclerosis. No enlarged abdominal or pelvic lymph nodes. Reproductive: Uterus and adnexa unremarkable.  No mass. Other: Moderate free fluid in the abdomen and pelvis. Open midline incision noted. Locules of free air throughout the upper abdominal mesentery/omentum with stranding, presumably postoperative. Musculoskeletal: No acute bony abnormality. IMPRESSION: Postoperative changes from partial colectomy. Right lower quadrant ostomy noted. No evidence of bowel obstruction. There is stranding and locules of gas within the upper abdominal mesentery/omentum, presumably postoperative. Moderate free fluid in the abdomen and pelvis. Small bilateral pleural effusions.  Bibasilar atelectasis. Low-density lesion in the caudate lobe compatible with known metastasis. Scattered hypodensities throughout the spleen, nonspecific. Stable left adrenal nodule/adenoma as seen on prior MRI. Electronically Signed   By: Rolm Baptise M.D.   On: 01/05/2019 15:53    Anti-infectives: Anti-infectives (From admission, onward)   Start     Dose/Rate Route Frequency Ordered Stop   01/03/19 1000  amoxicillin (AMOXIL) capsule 500 mg     500 mg Oral Every 8 hours 01/03/19 0835     01/03/19 0800  ampicillin-sulbactam (UNASYN) 1.5 g in sodium chloride 0.9 % 100 mL IVPB  Status:  Discontinued     1.5 g 200 mL/hr over  30 Minutes Intravenous Every 6 hours 01/03/19 0738 01/03/19 0834   12/29/18 1000  cefoTEtan (CEFOTAN) 2 g in  sodium chloride 0.9 % 100 mL IVPB  Status:  Discontinued     2 g 200 mL/hr over 30 Minutes Intravenous Every 24 hours 12/28/18 1654 12/29/18 0945   12/29/18 1000  cefoTEtan (CEFOTAN) 2 g in sodium chloride 0.9 % 100 mL IVPB  Status:  Discontinued     2 g 200 mL/hr over 30 Minutes Intravenous Every 12 hours 12/29/18 0945 12/30/18 0738   12/27/18 2200  cefoTEtan (CEFOTAN) 2 g in sodium chloride 0.9 % 100 mL IVPB  Status:  Discontinued     2 g 200 mL/hr over 30 Minutes Intravenous Every 12 hours 12/27/18 1449 12/28/18 1654   12/27/18 1039  sodium chloride 0.9 % with cefoTEtan (CEFOTAN) ADS Med    Note to Pharmacy:  Chrystine Oiler: cabinet override      12/27/18 1039 12/27/18 1130   12/26/18 1230  cefoTEtan (CEFOTAN) 2 g in sodium chloride 0.9 % 100 mL IVPB     2 g 200 mL/hr over 30 Minutes Intravenous On call to O.R. 12/26/18 1224 12/27/18 0559      Assessment/Plan: s/p Procedure(s): LEFT SIGMOID COLECTOMY WITH HARTMANN POUCH AND END COLOSTOMY (N/A)  WBC lower today, CT looks fine.  Only has post op changes Urine cultures positive for Enterococcus on 2/19. Amoxicillin started 3 days ago. D/C staples      LOS: 11 days    Rosario Adie 04/26/4314

## 2019-01-06 NOTE — Progress Notes (Signed)
17 staples removed from midline abdominal incision. Steri-strips applied to midline incision. Moist to dry dressing reapplied Patient tolerated well.

## 2019-01-06 NOTE — Care Management Important Message (Signed)
Important Message  Patient Details  Name: Cassandra Allen MRN: [id number] Date of Birth: 07/08/1946   Medicare Important Message Given:  Yes    Kerin Salen 01/06/2019, 12:24 [place] Message  Patient Details  Name: Cassandra Allen MRN: [id number] Date of Birth: May 02, 1946   Medicare Important Message Given:  Yes    Kerin Salen 01/06/2019, 12:24 PM

## 2019-01-06 NOTE — Evaluation (Signed)
Occupational Therapy Evaluation Patient Details Name: Cassandra Allen MRN: [id number] DOB: Jul 11, 1946 Today's Date: 01/06/2019    History of Present Illness 73 year old woman with hx AFib on eliquis, CHF, DM, and newly dx metastatic colon ca 10/25/18 and currently status post exploratory laparotomy with partial colectomy and transverse colostomy on 12/27/18    Clinical Impression   Pt admitted for colectomy surgery. Pt currently with functional limitations due to the deficits listed below (see OT Problem List).  Pt will benefit from skilled OT to increase their safety and independence with ADL and functional mobility for ADL to facilitate discharge to venue listed below.      Follow Up Recommendations  SNF    Equipment Recommendations  None recommended by OT       Precautions / Restrictions Precautions Precautions: Fall Precaution Comments: multiple lines/leads, colostomy,  Restrictions Weight Bearing Restrictions: No      Mobility Bed Mobility Overal bed mobility: Needs Assistance Bed Mobility: Rolling;Sidelying to Sit Rolling: Mod assist;+2 for physical assistance;+2 for safety/equipment Sidelying to sit: Max assist;+2 for physical assistance;+2 for safety/equipment       General bed mobility comments: cues for technique and physical assist   Transfers Overall transfer level: Needs assistance Equipment used: Rolling walker (2 wheeled) Transfers: Sit to/from [facility] Sit to Stand: Total assist(pt unable to clear hips from recliner with 1 attempt of sit to stand, pt declined further attempts with +2 assist 2* fatigue) Stand pivot transfers: Max assist;+2 physical assistance;+2 safety/equipment;From elevated surface            Balance Overall balance assessment: Needs assistance Sitting-balance support: Bilateral upper extremity supported;Feet supported Sitting balance-Leahy Scale: Fair     Standing balance support: Bilateral upper  extremity supported;During functional activity Standing balance-Leahy Scale: Poor Standing balance comment: reliant on RW                           ADL either performed or assessed with clinical judgement   ADL Overall ADL's : Needs assistance/impaired     Grooming: Minimal assistance;Sitting   Upper Body Bathing: Moderate assistance;Sitting   Lower Body Bathing: +2 for physical assistance;+2 for safety/equipment;Total assistance;Sit to/from stand;Cueing for sequencing;Cueing for safety   Upper Body Dressing : Minimal assistance;Sitting   Lower Body Dressing: +2 for safety/equipment;+2 for physical assistance;Total assistance;Sit to/from stand;Cueing for sequencing;Cueing for safety   Toilet Transfer: Cueing for safety;Cueing for sequencing;Stand-pivot;+2 for physical assistance;+2 for safety/equipment;Maximal assistance   Toileting- Clothing Manipulation and Hygiene: Total assistance;Sit to/from stand;Cueing for sequencing;Cueing for safety;+2 for safety/equipment;+2 for physical assistance         General ADL Comments: Pt will need ST SNF.  Pt agrees. Pt does not have A as home with ADL activity                   Pertinent Vitals/Pain Pain Assessment: No/denies pain Faces Pain Scale: Hurts whole lot Pain Location: abdomen with transition side ly to sit and sit to stand Pain Descriptors / Indicators: Aching;Sore;Grimacing Pain Intervention(s): Repositioned;Limited activity within patient's tolerance     Hand Dominance     Extremity/Trunk Assessment Upper Extremity Assessment Upper Extremity Assessment: Generalized weakness           Communication Communication Communication: No difficulties   Cognition Arousal/Alertness: Awake/alert Behavior During Therapy: WFL for tasks assessed/performed Overall Cognitive Status: Within Functional Limits for tasks assessed  General Comments: Pt following cues and  responding appropriately to questions but verbalizes minimally              Home Living Family/patient expects to be discharged to:: Private residence Living Arrangements: Alone Available Help at Discharge: Family;Available PRN/intermittently Type of Home: Apartment Home Access: Level entry     Home Layout: One level     Bathroom Shower/Tub: Tub/shower unit         Home Equipment: Cane - single point;Walker - 4 wheels          Prior Functioning/Environment Level of Independence: Independent with assistive device(s)        Comments: cane in apt, rollator in community. Drives. Does her own grocery shopping. (from recent admission - pt flat and not forthcoming with information at time of evaluation)        OT Problem List: Decreased strength;Decreased activity tolerance;Pain;Decreased knowledge of precautions;Decreased safety awareness;Impaired balance (sitting and/or standing);Decreased knowledge of use of DME or AE;Obesity      OT Treatment/Interventions: Self-care/ADL training;Patient/family education;Therapeutic activities    OT Goals(Current goals can be found in the care plan section) Acute Rehab OT Goals Patient Stated Goal: get well OT Goal Formulation: With patient Time For Goal Achievement: 01/20/19  OT Frequency: Min 2X/week   Barriers to D/C: Decreased caregiver support             AM-PAC OT "6 Clicks" Daily Activity     Outcome Measure Help from another person eating meals?: A Little Help from another person taking care of personal grooming?: A Little Help from another person toileting, which includes using toliet, bedpan, or urinal?: Total Help from another person bathing (including washing, rinsing, drying)?: A Lot Help from another person to put on and taking off regular upper body clothing?: A Lot Help from another person to put on and taking off regular lower body clothing?: Total 6 Click Score: 12   End of Session Equipment Utilized  During Treatment: Rolling walker Nurse Communication: Mobility status  Activity Tolerance: Patient limited by pain Patient left: in chair;with call bell/phone within reach  OT Visit Diagnosis: Unsteadiness on feet (R26.81);Muscle weakness (generalized) (M62.81);Other abnormalities of gait and mobility (R26.89);Pain Pain - part of body: (abdomen)                Time: 6010-9323 OT Time Calculation (min): 19 min Charges:  OT General Charges $OT Visit: 1 Visit OT Evaluation $OT Eval Moderate Complexity: 1 Mod  Kari Baars, [place] [phone] Office- [phone], Edwena Felty D 01/06/2019, 12:53 PM

## 2019-01-07 LAB — CBC
HCT: 30.2 % — ABNORMAL LOW (ref 36.0–46.0)
Hemoglobin: 9 g/dL — ABNORMAL LOW (ref 12.0–15.0)
MCH: 29.2 pg (ref 26.0–34.0)
MCHC: 29.8 g/dL — ABNORMAL LOW (ref 30.0–36.0)
MCV: 98.1 fL (ref 80.0–100.0)
Platelets: 297 10*3/uL (ref 150–400)
RBC: 3.08 MIL/uL — ABNORMAL LOW (ref 3.87–5.11)
RDW: 15.8 % — ABNORMAL HIGH (ref 11.5–15.5)
WBC: 13.1 10*3/uL — ABNORMAL HIGH (ref 4.0–10.5)
nRBC: 0 % (ref 0.0–0.2)

## 2019-01-07 LAB — BASIC METABOLIC PANEL
Anion gap: 6 (ref 5–15)
BUN: 33 mg/dL — ABNORMAL HIGH (ref 8–23)
CHLORIDE: 102 mmol/L (ref 98–111)
CO2: 34 mmol/L — AB (ref 22–32)
Calcium: 7.8 mg/dL — ABNORMAL LOW (ref 8.9–10.3)
Creatinine, Ser: 0.68 mg/dL (ref 0.44–1.00)
GFR calc Af Amer: >60 mL/min (ref >60)
GFR calc non Af Amer: >60 mL/min (ref >60)
Glucose, Bld: 123 mg/dL — ABNORMAL HIGH (ref 70–99)
Potassium: 4.1 mmol/L (ref 3.5–5.1)
Sodium: 142 mmol/L (ref 135–145)

## 2019-01-07 LAB — GLUCOSE, CAPILLARY
Glucose-Capillary: 116 mg/dL — ABNORMAL HIGH (ref 70–99)
Glucose-Capillary: 140 mg/dL — ABNORMAL HIGH (ref 70–99)
Glucose-Capillary: 146 mg/dL — ABNORMAL HIGH (ref 70–99)
Glucose-Capillary: 76 mg/dL (ref 70–99)

## 2019-01-07 LAB — MAGNESIUM: Magnesium: 1.7 mg/dL (ref 1.7–2.4)

## 2019-01-07 NOTE — Progress Notes (Signed)
PROGRESS NOTE  NATARSHA HURWITZ KWI:[id number] DOB: 06/21/1946 DOA: 12/25/2018 PCP: [name], MD   LOS: 12 days   Brief Narrative / Interim history: 73 year old female with history of newly diagnosed colon cancer on 10/25/2018 with metastasis started on FOLFOX biweekly on 12/04/2018, iron deficiency anemia, DM 2, hypertension, CKD and atrial fibrillation on anticoagulation who presented with abdominal pain and found to have obstructing colon mass.  Patient had laparotomy with resection of sigmoid colon, descending colon and distal transverse colon, and colostomy.  Pathology showed moderately differentiated adenocarcinoma.  Oncology consulted and is not planning for chemotherapy in the near future.  Hospitalization complicated by hypotension, anemia, and A. fib with RVR for which she was transferred to stepdown unit on 2/13.  Started on metoprolol IV and heparin.  Heart rate improved to 80s and 90s.  Transitioned to metoprolol 25 mg twice daily and Eliquis and remained stable.  See individual problem list below for more.  Subjective: No major events overnight of this morning.  No complaint this morning.  Denies chest pain, dyspnea, nausea, vomiting, abdominal pain or dysuria.  Assessment & Plan: Principal Problem:   Colonic obstruction due to metastatic colon cancer Active Problems:   Essential (primary) hypertension   Diabetes mellitus without complication (HCC)   AKI (acute kidney injury) ([place])   Iron deficiency anemia   Cancer of splenic flexure of colon   Atrial fibrillation, chronic   Anticoagulant long-term use   Cancer of sigmoid colon    History of adenomatous polyp of colon   Acute diastolic heart failure (HCC)   Liver metastases from liver cancer   CKD (chronic kidney disease) stage 3, GFR 30-59 ml/min (HCC)   Obesity (BMI 30-39.9)   Choledocholithiasis by MRCP   Status post partial colectomy Feb 2020  Obstructive colonic mass: Status post laparotomy, sigmoid,  descending colon and distal transverse colon resection with transverse colostomy on 12/27/2018.  Pathology showed invasive moderately differentiated adenocarcinoma.  Also concern about liver metastasis. -General surgery cleared for discharge and signed off. -Defer pain management to general surgery. -Cefotetan 2/13 to 2/16. -TPN 2/17-2/21 -Tolerating soft diet.  -No plan for chemo in the near future until she recovers per oncology, Dr. Burr Medico. -Out of bed x3 a day for meals. -Encourage incentive spirometry. -Wean off oxygen completely. -PT/OT--SNF -Encourage incentive spirometry  Mild leukocytosis: Improving.  Afebrile.  CXR without pneumonia.  Being treated for enterococcus UTI.  CT abdomen on 2/23 without significant finding. -Continue monitoring -Holding Lasix.  A. fib with RVR: Now rate controlled.  [name] vascular score 5. -Continue metoprolol 30 mg twice daily -Oral digoxin 0.125 mg daily-level therapeutic -Continue home Eliquis.  Diastolic CHF exacerbation: Echo on 12/23/2018 with EF of 60 to 65% but no other significant structural functional abnormalities.  CXR on 2/18 with vascular congestion.  Diuresed well with IV Lasix -Resume home Lasix -Daily weight, intake output and renal function.  Hypernatremia: Resolved. -Continue monitoring  Hypomagnesemia: Resolved. -Replace and recheck.  Hypertension: Normotensive -Metoprolol and Lasix as above -PRN hydralazine  Mild thrombocytopenia: Resolved.  Anemia secondary to GI bleed/chronic disease: Hgb 10--> 8.2-->--> 7.6--> 1 units---8.9. -Continue monitoring  DM2: CBG fairly controlled. -Start Lantus 10 units twice daily -Continue SSI-high  Enterococcus UTI/bacteriuria: sensitive to ampicillin. -Amoxicillin 2/21-->  Scheduled Meds: . acetaminophen  650 mg Oral Q6H  . amoxicillin  500 mg Oral Q8H  . apixaban  5 mg Oral BID  . chlorhexidine  15 mL Mouth Rinse BID  . digoxin  0.125 mg Oral  Daily  . feeding supplement  (ENSURE ENLIVE)  237 mL Oral Q24H  . feeding supplement (PRO-STAT SUGAR FREE 64)  30 mL Oral TID BM  . insulin aspart  0-20 Units Subcutaneous TID WC  . insulin aspart  0-5 Units Subcutaneous QHS  . insulin glargine  10 Units Subcutaneous BID  . lip balm  1 application Topical BID  . mouth rinse  15 mL Mouth Rinse q12n4p  . metoprolol tartrate  25 mg Oral BID  . oxybutynin  20 mg Oral Daily  . pantoprazole  40 mg Oral Daily  . sertraline  100 mg Oral Daily  . sodium chloride flush  10-40 mL Intracatheter Q12H   Continuous Infusions:  PRN Meds:.hydrALAZINE, menthol-cetylpyridinium, ondansetron (ZOFRAN) IV, oxyCODONE, prochlorperazine  DVT prophylaxis: On Eliquis for atrial fibrillation. Code Status: Full code Family Communication: None at bedside.  Attempted to call patient (child) update. No answer. Disposition Plan: Anticipate discharge to SNF after insurance authorization.  Consultants:   General surgery  Oncology  Procedures:   Laparotomy, sigmoid, descending and distal transverse colon resection, Hartmann pouch  Antimicrobials:  Cefotetan 2/13 to 2/16  Objective: Vitals:   01/07/19 0500 01/07/19 0538 01/07/19 0651 01/07/19 1344  BP:  (!) 146/66  127/69  Pulse:  (!) 104  90  Resp:  18  16  Temp:  98.7 F (37.1 C)  98.2 F (36.8 C)  TempSrc:  Oral  Oral  SpO2:  94% 97% (!) 89%  Weight: 80.5 kg     Height:        Intake/Output Summary (Last 24 hours) at 01/07/2019 1353 Last data filed at 01/07/2019 1200 Gross per 24 hour  Intake 1060 ml  Output 1550 ml  Net -490 ml   Filed Weights   01/05/19 1422 01/06/19 1822 01/07/19 0500  Weight: 81.5 kg 80.3 kg 80.5 kg    Examination:  GENERAL: Appears well. No acute distress.  HEENT: MMM.  Vision and Hearing grossly intact.  NECK: Supple.  No JVD.  LUNGS:  No IWOB.  Fair air movement bilaterally. HEART: IR IR. Heart sounds normal.  ABD: Bowel sounds present. Soft. Non tender.  EXT:   no edema bilaterally.    SKIN: no apparent skin lesion.  NEURO: Awake, alert and oriented appropriately.  No gross deficit.  PSYCH: Somewhat flat affect.  Data Reviewed: I have independently reviewed following labs and imaging studies  CBC: Recent Labs  Lab 01/03/19 0259 01/04/19 0309 01/04/19 2056 01/05/19 0356 01/06/19 0352 01/07/19 0419  WBC 11.9* 14.3*  --  15.1* 14.4* 13.1*  HGB 8.2* 7.6* 9.0* 9.2* 8.9* 9.0*  HCT 26.8* 25.9* 29.1* 31.1* 29.4* 30.2*  MCV 97.8 99.2  --  98.7 97.4 98.1  PLT 136* 152  --  206 238 370   Basic Metabolic Panel: Recent Labs  Lab 01/01/19 0447 01/02/19 0518 01/03/19 0259 01/04/19 0309 01/05/19 0356 01/06/19 0352 01/07/19 0419  NA 146* 143 139 140 142 141 142  K 4.1 4.0 4.5 5.0 4.7 4.3 4.1  CL 105 101 96* 98 99 100 102  CO2 33* 34* 34* 36* 32 35* 34*  GLUCOSE 214* 181* 290* 108* 103* 109* 123*  BUN 31* 25* 27* 30* 37* 34* 33*  CREATININE 0.68 0.61 0.74 0.77 0.96 0.80 0.68  CALCIUM 7.8* 7.4* 7.4* 7.6* 7.7* 7.6* 7.8*  MG 2.0 1.4* 1.7  --  1.8 1.7 1.7  PHOS 2.3* 3.1 3.4  --   --   --   --  GFR: Estimated Creatinine Clearance: 63.8 mL/min (by C-G formula based on SCr of 0.68 mg/dL). Liver Function Tests: Recent Labs  Lab 01/02/19 0518  ALBUMIN 1.7*   No results for input(s): LIPASE, AMYLASE in the last 168 hours. No results for input(s): AMMONIA in the last 168 hours. Coagulation Profile: No results for input(s): INR, PROTIME in the last 168 hours. Cardiac Enzymes: No results for input(s): CKTOTAL, CKMB, CKMBINDEX, TROPONINI in the last 168 hours. BNP (last 3 results) No results for input(s): PROBNP in the last 8760 hours. HbA1C: No results for input(s): HGBA1C in the last 72 hours. CBG: Recent Labs  Lab 01/06/19 1141 01/06/19 1634 01/06/19 2107 01/07/19 0736 01/07/19 1132  GLUCAP 148* 85 74 116* 140*   Lipid Profile: No results for input(s): CHOL, HDL, LDLCALC, TRIG, CHOLHDL, LDLDIRECT in the last 72 hours. Thyroid Function Tests: No  results for input(s): TSH, T4TOTAL, FREET4, T3FREE, THYROIDAB in the last 72 hours. Anemia Panel: No results for input(s): VITAMINB12, FOLATE, FERRITIN, TIBC, IRON, RETICCTPCT in the last 72 hours. Urine analysis:    Component Value Date/Time   COLORURINE YELLOW 01/01/2019 1103   APPEARANCEUR CLEAR 01/01/2019 1103   LABSPEC 1.010 01/01/2019 1103   PHURINE 5.5 01/01/2019 1103   GLUCOSEU NEGATIVE 01/01/2019 1103   HGBUR SMALL (A) 01/01/2019 1103   BILIRUBINUR NEGATIVE 01/01/2019 1103   KETONESUR NEGATIVE 01/01/2019 1103   PROTEINUR NEGATIVE 01/01/2019 1103   UROBILINOGEN 0.2 03/25/2015 2317   NITRITE NEGATIVE 01/01/2019 1103   LEUKOCYTESUR NEGATIVE 01/01/2019 1103   Sepsis Labs: Invalid input(s): PROCALCITONIN, LACTICIDVEN  Recent Results (from the past 240 hour(s))  Culture, Urine     Status: Abnormal   Collection Time: 01/01/19 11:03 AM  Result Value Ref Range Status   Specimen Description   Final    URINE, CATHETERIZED Performed at [facility], [place] [address] [place], [place] [address]    Special Requests   Final    NONE Performed at [facility], [place] [address], [place], [place] [address]    Culture >=100,000 COLONIES/mL ENTEROCOCCUS FAECALIS (A)  Final   Report Status 01/03/2019 FINAL  Final   Organism ID, Bacteria ENTEROCOCCUS FAECALIS (A)  Final      Susceptibility   Enterococcus faecalis - MIC*    AMPICILLIN <=2 SENSITIVE Sensitive     LEVOFLOXACIN 1 SENSITIVE Sensitive     NITROFURANTOIN <=16 SENSITIVE Sensitive     VANCOMYCIN 1 SENSITIVE Sensitive     * >=100,000 COLONIES/mL ENTEROCOCCUS FAECALIS      Radiology Studies: No results found.  Kenesha Moshier [facility] Pager [phone]  If 7PM-7AM, please contact night-coverage [email or web address] Password [email or web address] 01/07/2019, 1:53 PM

## 2019-01-07 NOTE — Progress Notes (Signed)
Attempted to get pt OOB with [facility]. Pt unable to stand without max assist from two staff members. Even then, pt does not tolerate well and is unable to stand for more than 2-3 seconds without sitting back down on bed. Pt eventually lifted up and placed in chair. Pt tolerated poorly. RN spoke with patient regarding safe discharge plan. Pt confirms that there is not anyone at home to help her. RN encouraged patient to speak with SW to discuss her options for SNF placement. Pt does not openly engage in conversation about discharge plan. She has to be prompted in order to answer questions. Pt is very withdrawn. SW consult order placed for nursing home placement.

## 2019-01-07 NOTE — Progress Notes (Signed)
[facility] Progress Note  11 Days Post-Op  Subjective: CC-  Tired this morning. Abdomen sore but pain well controlled. Still does not have much of an appetite, but is tolerating what she is eating. Denies n/v. Ostomy functioning.  Objective: Vital signs in last 24 hours: Temp:  [98 F (36.7 C)-98.7 F (37.1 C)] 98.7 F (37.1 C) (02/25 0538) Pulse Rate:  [95-104] 104 (02/25 0538) Resp:  [18-20] 18 (02/25 0538) BP: (125-146)/(64-72) 146/66 (02/25 0538) SpO2:  [91 %-99 %] 97 % (02/25 0651) Weight:  [80.3 kg-80.5 kg] 80.5 kg (02/25 0500) Last BM Date: 01/02/19  Intake/Output from previous day: 02/24 0701 - 02/25 0700 In: 1080 [P.O.:1080] Out: 1300 [Urine:900; Stool:400] Intake/Output this shift: No intake/output data recorded.  PE: Gen:  Alert but drowsy, NAD HEENT: EOM's intact, pupils equal and round Pulm:  effort normal Abd: Soft, mild distension, nontender, +BS, ostomy with liquid brown stool in pouch, midline incision with proximal and distal aspects cdi with steri strips in place and central aspect of wound open/pink without erythema or drainage  Lab Results:  Recent Labs    01/06/19 0352 01/07/19 0419  WBC 14.4* 13.1*  HGB 8.9* 9.0*  HCT 29.4* 30.2*  PLT 238 297   BMET Recent Labs    01/06/19 0352 01/07/19 0419  NA 141 142  K 4.3 4.1  CL 100 102  CO2 35* 34*  GLUCOSE 109* 123*  BUN 34* 33*  CREATININE 0.80 0.68  CALCIUM 7.6* 7.8*   PT/INR No results for input(s): LABPROT, INR in the last 72 hours. CMP     Component Value Date/Time   NA 142 01/07/2019 0419   K 4.1 01/07/2019 0419   CL 102 01/07/2019 0419   CO2 34 (H) 01/07/2019 0419   GLUCOSE 123 (H) 01/07/2019 0419   BUN 33 (H) 01/07/2019 0419   CREATININE 0.68 01/07/2019 0419   CREATININE 1.04 (H) 12/18/2018 1029   CALCIUM 7.8 (L) 01/07/2019 0419   PROT 4.7 (L) 12/31/2018 0337   ALBUMIN 1.7 (L) 01/02/2019 0518   AST 20 12/31/2018 0337   AST 60 (H) 12/18/2018 1029   ALT 20  12/31/2018 0337   ALT 117 (H) 12/18/2018 1029   ALKPHOS 84 12/31/2018 0337   BILITOT 0.6 12/31/2018 0337   BILITOT 0.5 12/18/2018 1029   GFRNONAA >60 01/07/2019 0419   GFRNONAA 54 (L) 12/18/2018 1029   GFRAA >60 01/07/2019 0419   GFRAA >60 12/18/2018 1029   Lipase     Component Value Date/Time   LIPASE 24 12/25/2018 2254       Studies/Results: Ct Abdomen Pelvis W Contrast  Result Date: 01/05/2019 CLINICAL DATA:  Postop infection. Sigmoid colectomy with Hartmann's pouch and colostomy. EXAM: CT ABDOMEN AND PELVIS WITH CONTRAST TECHNIQUE: Multidetector CT imaging of the abdomen and pelvis was performed using the standard protocol following bolus administration of intravenous contrast. CONTRAST:  164mL OMNIPAQUE IOHEXOL 300 MG/ML  SOLN COMPARISON:  12/26/2018 FINDINGS: Lower chest: Small bilateral pleural effusions. Bibasilar atelectasis. Heart is mildly enlarged. Hepatobiliary: Irregular low-density lesion in the caudate lobe measures up to 3.3 cm compatible with the known metastasis. No biliary ductal dilatation. Possible gallstone noted within the gallbladder. Pancreas: No focal abnormality or ductal dilatation. Spleen: Scattered hypodensities in the spleen, nonspecific. Normal size. Adrenals/Urinary Tract: Left adrenal lesion again noted measuring 3 cm. This is stable. Right adrenal gland is unremarkable. No focal renal abnormality or hydronephrosis. Urinary bladder grossly unremarkable. Stomach/Bowel: Changes of partial colectomy right lower quadrant ostomy noted.  Stomach is decompressed. No evidence of bowel obstruction. Vascular/Lymphatic: Aortic atherosclerosis. No enlarged abdominal or pelvic lymph nodes. Reproductive: Uterus and adnexa unremarkable.  No mass. Other: Moderate free fluid in the abdomen and pelvis. Open midline incision noted. Locules of free air throughout the upper abdominal mesentery/omentum with stranding, presumably postoperative. Musculoskeletal: No acute bony  abnormality. IMPRESSION: Postoperative changes from partial colectomy. Right lower quadrant ostomy noted. No evidence of bowel obstruction. There is stranding and locules of gas within the upper abdominal mesentery/omentum, presumably postoperative. Moderate free fluid in the abdomen and pelvis. Small bilateral pleural effusions.  Bibasilar atelectasis. Low-density lesion in the caudate lobe compatible with known metastasis. Scattered hypodensities throughout the spleen, nonspecific. Stable left adrenal nodule/adenoma as seen on prior MRI. Electronically Signed   By: Rolm Baptise M.D.   On: 01/05/2019 15:53    Anti-infectives: Anti-infectives (From admission, onward)   Start     Dose/Rate Route Frequency Ordered Stop   01/03/19 1000  amoxicillin (AMOXIL) capsule 500 mg     500 mg Oral Every 8 hours 01/03/19 0835     01/03/19 0800  ampicillin-sulbactam (UNASYN) 1.5 g in sodium chloride 0.9 % 100 mL IVPB  Status:  Discontinued     1.5 g 200 mL/hr over 30 Minutes Intravenous Every 6 hours 01/03/19 0738 01/03/19 0834   12/29/18 1000  cefoTEtan (CEFOTAN) 2 g in sodium chloride 0.9 % 100 mL IVPB  Status:  Discontinued     2 g 200 mL/hr over 30 Minutes Intravenous Every 24 hours 12/28/18 1654 12/29/18 0945   12/29/18 1000  cefoTEtan (CEFOTAN) 2 g in sodium chloride 0.9 % 100 mL IVPB  Status:  Discontinued     2 g 200 mL/hr over 30 Minutes Intravenous Every 12 hours 12/29/18 0945 12/30/18 0738   12/27/18 2200  cefoTEtan (CEFOTAN) 2 g in sodium chloride 0.9 % 100 mL IVPB  Status:  Discontinued     2 g 200 mL/hr over 30 Minutes Intravenous Every 12 hours 12/27/18 1449 12/28/18 1654   12/27/18 1039  sodium chloride 0.9 % with cefoTEtan (CEFOTAN) ADS Med    Note to Pharmacy:  Chrystine Oiler: cabinet override      12/27/18 1039 12/27/18 1130   12/26/18 1230  cefoTEtan (CEFOTAN) 2 g in sodium chloride 0.9 % 100 mL IVPB     2 g 200 mL/hr over 30 Minutes Intravenous On call to O.R. 12/26/18 1224  12/27/18 0559       Assessment/Plan Recent admission for chest pain Atrial fibrillation on Eliquis Hypertension Acute on chronic kidney disease stage III Type 2 diabetes- SSI Hx depression Anemia- Hg 9, stable Malnutrition- prealbumin5.6 2/18  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 Hassell Done -POD#11 -surgical pathology consistent with adenocarcinoma in proximal descending colon mass, sigmoid mass consistent with large villous adenoma -BID wet to dry dressing changes to midline wound. Ok to shower with wound open - remaining staples removed 2/24 Liver metastasis- per oncology Enterococcus Faecalis UTI - seen on urine cx, WBC trending down, on amoxicillin  FEN:CM diet, Ensure ID: cefotetan 2/13>2/17; unasyn 2/21>>2/21, amoxicillin 2/21>> [id number]:[id number] Foley: out  Follow up: Dr. Hassell Done  Plan: Continue BID wet to dry dressing changes to wound. Encourage PO intake. Continue therapies/ mobilize. General surgery will sign off, please call us with any concerns. Discharge instructions and f/u info on AVS.   LOS: 12 days    Wellington Hampshire ,  [facility] 01/07/2019, 8:06 AM Pager: [phone] Mon-Thurs 7:00 am-4:30 pm Fri 7:00 am -11:30 AM Sat-Sun 7:00 am-11:30 am

## 2019-01-08 LAB — GLUCOSE, CAPILLARY
GLUCOSE-CAPILLARY: 141 mg/dL — AB (ref 70–99)
Glucose-Capillary: 72 mg/dL (ref 70–99)
Glucose-Capillary: 167 mg/dL — ABNORMAL HIGH (ref 70–99)
Glucose-Capillary: 82 mg/dL (ref 70–99)

## 2019-01-08 LAB — CBC
HCT: 28.7 % — ABNORMAL LOW (ref 36.0–46.0)
HEMOGLOBIN: 8.6 g/dL — AB (ref 12.0–15.0)
MCH: 29.5 pg (ref 26.0–34.0)
MCHC: 30 g/dL (ref 30.0–36.0)
MCV: 98.3 fL (ref 80.0–100.0)
Platelets: 324 10*3/uL (ref 150–400)
RBC: 2.92 MIL/uL — ABNORMAL LOW (ref 3.87–5.11)
RDW: 15.7 % — ABNORMAL HIGH (ref 11.5–15.5)
WBC: 10.9 10*3/uL — ABNORMAL HIGH (ref 4.0–10.5)
nRBC: 0 % (ref 0.0–0.2)

## 2019-01-08 LAB — MAGNESIUM: Magnesium: 1.7 mg/dL (ref 1.7–2.4)

## 2019-01-08 MED ORDER — PRO-STAT SUGAR FREE PO LIQD: 30.0000 mL | Freq: Three times a day (TID) | ORAL | 0 refills | Status: DC

## 2019-01-08 MED ORDER — HYDROCODONE-ACETAMINOPHEN 7.5-325 MG PO TABS: 1.0000 | ORAL_TABLET | Freq: Two times a day (BID) | ORAL | 0 refills | Status: DC | PRN

## 2019-01-08 MED ORDER — ENSURE ENLIVE PO LIQD: 237.0000 mL | ORAL | 12 refills | Status: DC

## 2019-01-08 MED ORDER — INSULIN ASPART 100 UNIT/ML ~~LOC~~ SOLN: 0.0000 [IU] | Freq: Three times a day (TID) | SUBCUTANEOUS | 11 refills | Status: DC

## 2019-01-08 MED ORDER — DILTIAZEM HCL ER COATED BEADS 120 MG PO CP24: 120.0000 mg | ORAL_CAPSULE | Freq: Every day | ORAL | 3 refills | Status: DC

## 2019-01-08 NOTE — Discharge Summary (Addendum)
Physician Discharge Summary  Cassandra Allen [facility] HGD:[id number] DOB: 11-Jan-1946 DOA: 12/25/2018  PCP: [name], MD  Admit date: 12/25/2018 Discharge date: 01/10/2019  Admitted From: Home Discharge disposition: Skilled nursing facility  Recommendations for Outpatient Follow-Up:   1. Ostomy care.  Discharge Diagnosis:   Principal Problem:   Colonic obstruction due to metastatic colon cancer Active Problems:   Essential (primary) hypertension   Diabetes mellitus without complication (HCC)   Iron deficiency anemia   Cancer of splenic flexure of colon   Atrial fibrillation, chronic   Anticoagulant long-term use   Cancer of sigmoid colon    History of adenomatous polyp of colon   Liver metastases from liver cancer   CKD (chronic kidney disease) stage 3, GFR 30-59 ml/min (HCC)   Obesity (BMI 30-39.9)   Choledocholithiasis by MRCP   Status post partial colectomy Feb 2020  Discharge Condition: Improved. Diet recommendation: Oral soft diet. Wound care: Ostomy care Code status: Full.  History of Present Illness / Brief narrative:  Patient is a 73 year old female with history of newly diagnosed colon cancer on 10/25/2018 with metastasis started on FOLFOX biweekly on 12/04/2018, iron deficiency anemia, DM 2, hypertension, CKD and atrial fibrillation on anticoagulation. She presented on 12/26/18 with abdominal pain and was found to have obstructing colon mass. On 2/14, patient underwent laparotomy with resection of sigmoid colon, descending colon and distal transverse colon, and colostomy.  Pathology showed moderately differentiated adenocarcinoma.  Oncology was consulted and is not planning for chemotherapy in the near future.  Hospitalization was complicated by hypotension, anemia, and A. fib with RVR  See individual problem list below for more.   Hospital Course:  Metastatic adenocarcinoma: Presented as obstructive colonic mass. On 12/27/2018, patient underwent laparotomy,  sigmoid, descending colon and distal transverse colon resection with transverse colostomy. Postsurgically, patient required TPN (2/17-2/21). Currently on soft diet but oral appetite is poor. Pathology showed invasive moderately differentiated adenocarcinoma. Also concern about liver metastasis. No plan for chemo in the near future until she recovers per oncology, Dr. Burr Medico.  Continue pain management with oxycodone as needed.  Enterococcus UTI/bacteriuria: Currently on amoxicillin.  No need to continue post discharge.  A. fib with RVR: Now rate controlled. [name] vascular score 5. Prior to admission, patient was on Cardizem 120 mg daily. It was held on admission. In the hospital, metoprolol and digoxin were used.  I switched her back toCardizem at 120 mg daily. Continue Eliquis for anticoagulation.  Diastolic CHF exacerbation:Echo on 12/23/2018 with EF of 60 to 65% but no other significant structuralorfunctional abnormalities. CXR on 2/18 with vascular congestion. Diuresed well with IV Lasix - [place] Lasixas needed.  Hypernatremia: Resolved. Continue monitoring  Hypomagnesemia: Resolved.  Hypertension: Normotensive.CardizemandPRNLasix as above  Mild thrombocytopenia:Resolved.  Anemia secondary to GI bleed/chronic disease: Hemoglobin has been stable between 8-9.  DM2:CBG fairly controlled. Prior to admission, patient was on Levemir 7 units daily and Victoza.In the hospital, blood sugar level was controlled with Lantus and sliding scale insulin.  Currently on Lantus 10 units at bedtime.  Because of poor oral intake, blood sugar remains mostly controlled and is not requiring sliding scale insulin.   Medical Consultants:    None.   Discharge Exam:   Vitals:   01/10/19 0607 01/10/19 1430  BP: 135/74 125/65  Pulse: 84 93  Resp: 20 16  Temp: 98.7 F (37.1 C) 98.2 F (36.8 C)  SpO2: 92% 97%   Vitals:   01/09/19 2044 01/10/19 0607 01/10/19 0643  01/10/19 1430  BP: Marland Kitchen)  110/57 135/74  125/65  Pulse: 88 84  93  Resp: 20 20  16   Temp: 98.8 F (37.1 C) 98.7 F (37.1 C)  98.2 F (36.8 C)  TempSrc: Oral Oral  Oral  SpO2: 90% 92%  97%  Weight:   78.9 kg   Height:        General exam: Appears calm and comfortable.  Skin: No rashes, lesions or ulcers. Respiratory system: Clear to auscultation bilaterally Cardiovascular system: Regular rate and rhythm, no murmur Gastrointestinal system: Bowel sounds present, colostomy bag with liquid stool Central nervous system: Alert, awake, oriented to place and person Psychiatry: Mood & affect appropriate.  Extremities: No pedal edema, no calf tenderness   The results of significant diagnostics from this hospitalization (including imaging, microbiology, ancillary and laboratory) are listed below for reference.    Procedures and Diagnostic Studies:   Ct Abdomen Pelvis Wo Contrast  Result Date: 12/26/2018 CLINICAL DATA:  Abdominal pain, known history of colon carcinoma with liver metastatic disease EXAM: CT ABDOMEN AND PELVIS WITHOUT CONTRAST TECHNIQUE: Multidetector CT imaging of the abdomen and pelvis was performed following the standard protocol without IV contrast. COMPARISON:  MRI from 12/07/2018 and CT from 11/01/2018 FINDINGS: Lower chest: No acute abnormality. Hepatobiliary: Somewhat limited due to lack of IV contrast. Hypodense lesion is again seen in the caudate lobe consistent with the known metastatic disease. Gallbladder is well distended with multiple gallstones within. No biliary ductal dilatation is seen. Density is noted within the distal common bile duct consistent with common bile duct stone similar to that seen on prior MRI. Pancreas: Unremarkable. No pancreatic ductal dilatation or surrounding inflammatory changes. Spleen: Normal in size without focal abnormality. Adrenals/Urinary Tract: Right adrenal gland is within normal limits. Stable left adrenal lesion is seen shown to  represent adenoma on prior MRI. The kidneys are within normal limits bilaterally. No renal calculi or obstructive changes are seen. The bladder is partially decompressed. Stomach/Bowel: Scattered diverticular change of the colon is noted. The transverse and ascending colon are dilated and fluid-filled. There is a caliber change identified at the mid descending colon. This would correspond with the known colonic mass seen on prior colonoscopy. Additionally there is a mass lesion within lumen of the sigmoid colon similar to that seen on prior CT examination and colonoscopy. Small-bowel is significantly distended with fluid and air consistent with the proximal descending colon obstructing lesion. Dilatation extends from the fourth portion of the duodenum to the terminal ileum. Vascular/Lymphatic: Aortic atherosclerosis. No enlarged abdominal or pelvic lymph nodes. Reproductive: Uterus and bilateral adnexa are unremarkable. Other: No abdominal wall hernia or abnormality. No abdominopelvic ascites. Musculoskeletal: Degenerative changes of lumbar spine. IMPRESSION: Changes consistent with an obstructing lesion in the proximal descending colon consistent with the known history of colon carcinoma. The proximal colon as well as the entire small bowel is dilated secondary to the obstructive change. Second intraluminal mass within the sigmoid colon consistent with prior colonoscopy findings. Stable left adrenal lesion shown to represent adenoma on recent MRI. Hepatic metastatic disease similar to the prior MRI. Cholelithiasis without complicating factors. Electronically Signed   By: Inez Catalina M.D.   On: 12/26/2018 01:43   Dg Abdomen 1 View  Result Date: 12/26/2018 CLINICAL DATA:  Nasogastric tube placement EXAM: ABDOMEN - 1 VIEW COMPARISON:  Abdominal CT earlier today FINDINGS: Nasogastric tube tip at the distal stomach. Diffuse gaseous bowel distention in this patient with left colonic obstruction by CT. IMPRESSION: 1.  Nasogastric tube with tip at the  distal stomach. 2. Known descending colonic obstruction. Electronically Signed   By: Monte Fantasia M.D.   On: 12/26/2018 04:18     Labs:   Basic Metabolic Panel: Recent Labs  Lab 01/04/19 0309 01/05/19 0356 01/06/19 0352 01/07/19 0419 01/08/19 0330 01/09/19 0452  NA 140 142 141 142  --   --   K 5.0 4.7 4.3 4.1  --   --   CL 98 99 100 102  --   --   CO2 36* 32 35* 34*  --   --   GLUCOSE 108* 103* 109* 123*  --   --   BUN 30* 37* 34* 33*  --   --   CREATININE 0.77 0.96 0.80 0.68  --   --   CALCIUM 7.6* 7.7* 7.6* 7.8*  --   --   MG  --  1.8 1.7 1.7 1.7 1.7   GFR Estimated Creatinine Clearance: 63.2 mL/min (by C-G formula based on SCr of 0.68 mg/dL). Liver Function Tests: No results for input(s): AST, ALT, ALKPHOS, BILITOT, PROT, ALBUMIN in the last 168 hours. No results for input(s): LIPASE, AMYLASE in the last 168 hours. No results for input(s): AMMONIA in the last 168 hours. Coagulation profile No results for input(s): INR, PROTIME in the last 168 hours.  CBC: Recent Labs  Lab 01/06/19 0352 01/07/19 0419 01/08/19 0330 01/09/19 0452 01/10/19 0357  WBC 14.4* 13.1* 10.9* 9.0 10.2  HGB 8.9* 9.0* 8.6* 8.7* 8.9*  HCT 29.4* 30.2* 28.7* 29.7* 29.9*  MCV 97.4 98.1 98.3 99.0 97.7  PLT 238 297 324 375 412*   Cardiac Enzymes: No results for input(s): CKTOTAL, CKMB, CKMBINDEX, TROPONINI in the last 168 hours. BNP: Invalid input(s): POCBNP CBG: Recent Labs  Lab 01/09/19 1122 01/09/19 1653 01/09/19 2048 01/10/19 0804 01/10/19 1200  GLUCAP 147* 155* 174* 92 129*   D-Dimer No results for input(s): DDIMER in the last 72 hours. Hgb A1c No results for input(s): HGBA1C in the last 72 hours. Lipid Profile No results for input(s): CHOL, HDL, LDLCALC, TRIG, CHOLHDL, LDLDIRECT in the last 72 hours. Thyroid function studies No results for input(s): TSH, T4TOTAL, T3FREE, THYROIDAB in the last 72 hours.  Invalid input(s): FREET3 Anemia  work up No results for input(s): VITAMINB12, FOLATE, FERRITIN, TIBC, IRON, RETICCTPCT in the last 72 hours. Microbiology Recent Results (from the past 240 hour(s))  Culture, Urine     Status: Abnormal   Collection Time: 01/01/19 11:03 AM  Result Value Ref Range Status   Specimen Description   Final    URINE, CATHETERIZED Performed at [facility], [place] [address] [place], [place] [address]    Special Requests   Final    NONE Performed at [facility], [place] [address], [place], [place] [address]    Culture >=100,000 COLONIES/mL ENTEROCOCCUS FAECALIS (A)  Final   Report Status 01/03/2019 FINAL  Final   Organism ID, Bacteria ENTEROCOCCUS FAECALIS (A)  Final      Susceptibility   Enterococcus faecalis - MIC*    AMPICILLIN <=2 SENSITIVE Sensitive     LEVOFLOXACIN 1 SENSITIVE Sensitive     NITROFURANTOIN <=16 SENSITIVE Sensitive     VANCOMYCIN 1 SENSITIVE Sensitive     * >=100,000 COLONIES/mL ENTEROCOCCUS FAECALIS     Discharge Instructions:   Discharge Instructions    Diet - low sodium heart healthy   Complete by:  As directed    Increase activity slowly   Complete by:  As directed  Allergies as of 01/10/2019      Reactions   Metformin And Related Nausea And Vomiting   Prednisone Nausea And Vomiting      Medication List    STOP taking these medications   diphenoxylate-atropine 2.5-0.025 MG tablet Commonly known as:  LOMOTIL   insulin detemir 100 UNIT/ML injection Commonly known as:  LEVEMIR   liraglutide 18 MG/3ML Sopn Commonly known as:  VICTOZA   multivitamin with minerals Tabs tablet     TAKE these medications   apixaban 5 MG Tabs tablet Commonly known as:  ELIQUIS TAKE 1 TABLET (5 MG TOTAL) BY MOUTH 2 (TWO) TIMES DAILY. What changed:    how much to take  how to take this  when to take this  additional instructions   diltiazem 120 MG 24 hr capsule Commonly known as:  CARDIZEM CD Take 1 capsule (120 mg  total) by mouth daily. What changed:  when to take this   feeding supplement (ENSURE ENLIVE) Liqd Take 237 mLs by mouth daily.   feeding supplement (PRO-STAT SUGAR FREE 64) Liqd Take 30 mLs by mouth 3 (three) times daily between meals.   furosemide 20 MG tablet Commonly known as:  LASIX Lasix 20 mg po daily PRN for weight of greater than 3 lbs in a day or 5 lbs in a week   HYDROcodone-acetaminophen 7.5-325 MG tablet Commonly known as:  NORCO Take 1 tablet by mouth 2 (two) times daily as needed for up to 5 days (pain).   insulin aspart 100 UNIT/ML injection Commonly known as:  novoLOG Inject 0-20 Units into the skin 3 (three) times daily with meals.   insulin glargine 100 UNIT/ML injection Commonly known as:  LANTUS Inject 0.1 mLs (10 Units total) into the skin at bedtime.   oxybutynin 10 MG 24 hr tablet Commonly known as:  DITROPAN-XL Take 10 mg by mouth daily. What changed:  Another medication with the same name was removed. Continue taking this medication, and follow the directions you see here.   sertraline 100 MG tablet Commonly known as:  ZOLOFT Take 100 mg by mouth daily.   simvastatin 20 MG tablet Commonly known as:  ZOCOR Take 20 mg by mouth at bedtime.       Contact information for follow-up providers    [name], MD. Call.   Specialty:  General Surgery Why:  please call our office when you leave the hospital to arrange follow up regarding your recent surgery Contact information: [address] [place] [address] [phone]            Contact information for after-discharge care    [facility] .   Service:  Skilled Nursing Contact information: [address] [place] [phone]                   Time coordinating discharge: 39 minutes  Signed:  [name] Avigdor Dollar  [facility] 01/10/2019, 2:43 PM

## 2019-01-08 NOTE — Consult Note (Addendum)
[facility] Nurse ostomy follow up Surgical team following for assessment and plan of care for abd wound.  Stoma type/location: RUQ colostomy from 2/14 Stomal assessment/size: 1 1/4", flush with skin level, yellow slough covering outer stoma. Peristomal assessment: Intact skin surrounding stoma Output: Mod amt soft brown stool Ostomy pouching: 2pc: 2 1/4" pouch with barrier ring applied.   Education provided: Pt did not participate in pouch emptying or application.  She will need total assistance after discharge and could benefit from a SNF. She would not look at the stoma or ask questions. There were no family members present during the teaching session. Reviewed pouch change steps and emptying.  She was able to open and close the velcro on the pouch, but did not perform any other tasks. 5 extra sets of supplies at the bedside for staff nurse use. Enrolled patient in [place] Start Discharge program: Yes, previously [facility] team will continue to follow for teaching sessions. Julien Girt MSN, RN, [place], [place], [place]

## 2019-01-08 NOTE — NC FL2 (Signed)
[place] MEDICAID FL2 LEVEL OF CARE SCREENING TOOL     IDENTIFICATION  Patient Name: Cassandra Allen Birthdate: August 04, 1946 Sex: female Admission Date (Current Location): 12/25/2018  [facility] and [place] Number:  Herbalist and Address:  [facility],  [place] [address] [place]      Provider Number: [id number]  Attending Physician Name and Address:  Terrilee Croak, MD  Relative Name and Phone Number:  [name] Kimbell, [phone]    Current Level of Care: Hospital Recommended Level of Care: [place] Prior Approval Number:    Date Approved/Denied:   PASRR Number: [id number]  Discharge Plan: SNF    Current Diagnoses: Patient Active Problem List   Diagnosis Date Noted  . Cancer of sigmoid colon  12/27/2018  . History of adenomatous polyp of colon 12/27/2018  . Liver metastases from liver cancer 12/27/2018  . CKD (chronic kidney disease) stage 3, GFR 30-59 ml/min (HCC) 12/27/2018  . Obesity (BMI 30-39.9) 12/27/2018  . Cholelithiasis 12/27/2018  . Choledocholithiasis by MRCP 12/27/2018  . Status post partial colectomy Feb 2020 12/27/2018  . Colonic obstruction due to metastatic colon cancer 12/26/2018  . Anticoagulant long-term use 12/26/2018  . HLD (hyperlipidemia) 12/22/2018  . Acute renal failure superimposed on stage 2 chronic kidney disease ([place]) 12/22/2018  . Atrial fibrillation, chronic 12/22/2018  . Depression 12/22/2018  . Chest pain 12/22/2018  . Port-A-Cath in place 12/04/2018  . Goals of care, counseling/discussion 11/22/2018  . GI bleed 11/02/2018  . Rectal bleed   . Cancer of splenic flexure of colon 10/30/2018  . Iron deficiency anemia 04/04/2018  . 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 Pain in right ankle and joints of right foot 01/10/2018  . Essential (primary) hypertension 03/25/2015  . Diabetes mellitus without complication ([place])  50/07/3817    Orientation RESPIRATION BLADDER Height & Weight     Self, Time, Situation, Place  Normal, O2(2l) Continent Weight: 175 lb 0.7 oz (79.4 kg) Height:  5\' 3"  (160 cm)  BEHAVIORAL SYMPTOMS/MOOD NEUROLOGICAL BOWEL NUTRITION STATUS      Colostomy Diet(carb modified)  AMBULATORY STATUS COMMUNICATION OF NEEDS Skin   Extensive Assist Verbally Surgical wounds(dressing change twice a day (wet to dry))                       Personal Care Assistance Level of Assistance  Bathing, Feeding, Dressing Bathing Assistance: Limited assistance Feeding assistance: Independent Dressing Assistance: Maximum assistance     Functional Limitations Info  Sight, Hearing, Speech Sight Info: Adequate Hearing Info: Adequate Speech Info: Adequate    SPECIAL CARE FACTORS FREQUENCY  OT (By licensed OT), PT (By licensed PT)     PT Frequency: 5x wk OT Frequency: 5x wk            Contractures Contractures Info: Not present    Additional Factors Info  Code Status, Allergies Code Status Info: full code Allergies Info: METFORMIN AND RELATED, PREDNISONE           Current Medications (01/08/2019):  This is the current hospital active medication list Current Facility-Administered Medications  Medication Dose Route Frequency Provider Last Rate Last Dose  . acetaminophen (TYLENOL) tablet 650 mg  650 mg Oral Q6H Gonfa, Taye T, MD   650 mg at 01/08/19 0500  . amoxicillin (AMOXIL) capsule 500 mg  500 mg Oral Q8H Gonfa, Taye T, MD   500 mg at 01/08/19 0500  . apixaban (ELIQUIS)  tablet 5 mg  5 mg Oral BID Wendee Beavers T, MD   5 mg at 01/08/19 0947  . chlorhexidine (PERIDEX) 0.12 % solution 15 mL  15 mL Mouth Rinse BID Wendee Beavers T, MD   15 mL at 01/07/19 2215  . digoxin (LANOXIN) tablet 0.125 mg  0.125 mg Oral Daily Wendee Beavers T, MD   0.125 mg at 01/08/19 0947  . feeding supplement (ENSURE ENLIVE) (ENSURE ENLIVE) liquid 237 mL  237 mL Oral Q24H Wendee Beavers T, MD   237 mL at 01/08/19 0946  .  feeding supplement (PRO-STAT SUGAR FREE 64) liquid 30 mL  30 mL Oral TID BM Gonfa, Taye T, MD   30 mL at 01/08/19 0946  . hydrALAZINE (APRESOLINE) injection 10 mg  10 mg Intravenous Q4H PRN Gonfa, Taye T, MD      . insulin aspart (novoLOG) injection 0-20 Units  0-20 Units Subcutaneous TID WC Mercy Riding, MD   3 Units at 01/07/19 1716  . insulin aspart (novoLOG) injection 0-5 Units  0-5 Units Subcutaneous QHS Mercy Riding, MD   3 Units at 01/02/19 2257  . insulin glargine (LANTUS) injection 10 Units  10 Units Subcutaneous BID Mercy Riding, MD   10 Units at 01/07/19 2252  . lip balm (CARMEX) ointment 1 application  1 application Topical BID Mercy Riding, MD   1 application at 37/34/28 2217  . MEDLINE mouth rinse  15 mL Mouth Rinse q12n4p Gonfa, Taye T, MD   15 mL at 01/04/19 1750  . menthol-cetylpyridinium (CEPACOL) lozenge 3 mg  1 lozenge Oral PRN Wendee Beavers T, MD      . metoprolol tartrate (LOPRESSOR) tablet 25 mg  25 mg Oral BID Wendee Beavers T, MD   25 mg at 01/08/19 0946  . ondansetron (ZOFRAN) injection 4 mg  4 mg Intravenous Q6H PRN Gonfa, Taye T, MD      . oxybutynin (DITROPAN) tablet 20 mg  20 mg Oral Daily Wendee Beavers T, MD   20 mg at 01/08/19 0946  . oxyCODONE (Oxy IR/ROXICODONE) immediate release tablet 5-10 mg  5-10 mg Oral Q4H PRN Wendee Beavers T, MD   5 mg at 01/07/19 1513  . pantoprazole (PROTONIX) EC tablet 40 mg  40 mg Oral Daily Wendee Beavers T, MD   40 mg at 01/08/19 0946  . prochlorperazine (COMPAZINE) injection 5-10 mg  5-10 mg Intravenous Q4H PRN Gonfa, Taye T, MD      . sertraline (ZOLOFT) tablet 100 mg  100 mg Oral Daily Wendee Beavers T, MD   100 mg at 01/08/19 0946  . sodium chloride flush (NS) 0.9 % injection 10-40 mL  10-40 mL Intracatheter Q12H Mercy Riding, MD   10 mL at 01/08/19 7681     Discharge Medications: Please see discharge summary for a list of discharge medications.  Relevant Imaging Results:  Relevant Lab Results:   Additional Information SS#  [id number]  Wende Neighbors, LCSW

## 2019-01-08 NOTE — Progress Notes (Signed)
CSW following patient for support and discharge needs. Patient at this time has bed at [facility]. Patients family is aware of discharge plan and is agreeable for patient to go to [facility]. Authorization through patient insurance has been started. patient will not be able to discharge without insurance authorization.    Rhea Pink, MSW,  [place]

## 2019-01-08 NOTE — Progress Notes (Signed)
Physical Therapy Treatment Patient Details Name: Cassandra Allen MRN: [id number] DOB: 21-Feb-1946 Today's Date: 01/08/2019    History of Present Illness 73 year old woman with hx AFib on eliquis, CHF, DM, and newly dx metastatic colon ca 10/25/18 and currently status post exploratory laparotomy with partial colectomy and transverse colostomy on 12/27/18     PT Comments    Pt assisted with performing bed level exercises.  Pt reports inability to stand and transfer due to weakness.  Pt more engaged and talkative this session.  Pt eager to return to ambulating.  Continue to recommend SNF upon d/c.    Follow Up Recommendations  SNF     Equipment Recommendations  None recommended by PT    Recommendations for Other Services       Precautions / Restrictions Precautions Precautions: Fall Precaution Comments: colostomy Restrictions Weight Bearing Restrictions: No    Mobility  Bed Mobility               General bed mobility comments: pt declines due to fatigue, has been unable to be assisted OOB without lift per nursing  Transfers                    Ambulation/Gait                 Stairs             Wheelchair Mobility    Modified Rankin (Stroke Patients Only)       Balance                                            Cognition Arousal/Alertness: Awake/alert Behavior During Therapy: WFL for tasks assessed/performed Overall Cognitive Status: Within Functional Limits for tasks assessed                                 General Comments: pt more interactive this session      Exercises General Exercises - Upper Extremity Shoulder Flexion: AROM;10 reps;Both Shoulder Horizontal ADduction: AROM;10 reps;Both Elbow Flexion: AROM;5 reps;Both General Exercises - Lower Extremity Ankle Circles/Pumps: AROM;Both;10 reps Quad Sets: AROM;10 reps;Both Short Arc Quad: AROM;10 reps;Both Heel Slides: AAROM;10  reps;Both Hip ABduction/ADduction: AAROM;10 reps;Both    General Comments        Pertinent Vitals/Pain Pain Assessment: Faces Faces Pain Scale: Hurts a little bit Pain Location: "scotch tape" feet Pain Descriptors / Indicators: Sore;Tender Pain Intervention(s): Repositioned(applied lotion)    Home Living                      Prior Function            PT Goals (current goals can now be found in the care plan section) Progress towards PT goals: Progressing toward goals    Frequency    Min 2X/week      PT Plan Current plan remains appropriate    Co-evaluation              AM-PAC PT "6 Clicks" Mobility   Outcome Measure  Help needed turning from your back to your side while in a flat bed without using bedrails?: A Lot Help needed moving from lying on your back to sitting on the side of a flat bed without using bedrails?: A Lot Help needed moving to  and from a bed to a chair (including a wheelchair)?: A Lot Help needed standing up from a chair using your arms (e.g., wheelchair or bedside chair)?: Total Help needed to walk in hospital room?: Total Help needed climbing 3-5 steps with a railing? : Total 6 Click Score: 9    End of Session Equipment Utilized During Treatment: Oxygen Activity Tolerance: Patient limited by fatigue Patient left: with call bell/phone within reach;in bed;with bed alarm set   PT Visit Diagnosis: Muscle weakness (generalized) (M62.81)     Time: 9122-5834 PT Time Calculation (min) (ACUTE ONLY): 11 min  Charges:  $Therapeutic Exercise: 8-22 mins                     Carmelia Bake, PT, DPT Acute Rehabilitation Services Office: [phone] Pager: [phone]  Trena Platt 01/08/2019, 12:56 PM

## 2019-01-09 LAB — CBC
HCT: 29.7 % — ABNORMAL LOW (ref 36.0–46.0)
Hemoglobin: 8.7 g/dL — ABNORMAL LOW (ref 12.0–15.0)
MCH: 29 pg (ref 26.0–34.0)
MCHC: 29.3 g/dL — ABNORMAL LOW (ref 30.0–36.0)
MCV: 99 fL (ref 80.0–100.0)
Platelets: 375 10*3/uL (ref 150–400)
RBC: 3 MIL/uL — AB (ref 3.87–5.11)
RDW: 15.6 % — ABNORMAL HIGH (ref 11.5–15.5)
WBC: 9 10*3/uL (ref 4.0–10.5)
nRBC: 0 % (ref 0.0–0.2)

## 2019-01-09 LAB — GLUCOSE, CAPILLARY
Glucose-Capillary: 131 mg/dL — ABNORMAL HIGH (ref 70–99)
Glucose-Capillary: 147 mg/dL — ABNORMAL HIGH (ref 70–99)
Glucose-Capillary: 155 mg/dL — ABNORMAL HIGH (ref 70–99)
Glucose-Capillary: 174 mg/dL — ABNORMAL HIGH (ref 70–99)

## 2019-01-09 LAB — MAGNESIUM: Magnesium: 1.7 mg/dL (ref 1.7–2.4)

## 2019-01-09 MED ORDER — INSULIN GLARGINE 100 UNIT/ML ~~LOC~~ SOLN
10.0000 [IU] | Freq: Every day | SUBCUTANEOUS | Status: DC
  Administered 2019-01-09: 10 [IU] via SUBCUTANEOUS
  Filled 2019-01-09 (×2): qty 0.1

## 2019-01-09 NOTE — Progress Notes (Signed)
PROGRESS NOTE  Cassandra Allen WCB:[id number] DOB: 11/24/1945 DOA: 12/25/2018 PCP: [name], MD   LOS: 14 days   Brief narrative: Patient is a 73 year old female with history of newly diagnosed colon cancer on 10/25/2018 with metastasis started on FOLFOX biweekly on 12/04/2018, iron deficiency anemia, DM 2, hypertension, CKD and atrial fibrillation on anticoagulation. She presented on 12/26/18 with abdominal pain and was found to have obstructing colon mass. On 2/14, patient underwent laparotomy with resection of sigmoid colon, descending colon and distal transverse colon, and colostomy. Pathology showed moderately differentiated adenocarcinoma. Oncology was consulted and is not planning for chemotherapy in the near future. Hospitalization was complicated by hypotension, anemia, and A. fib with RVR  See individual problem list below for more.  Subjective: Patient was seen and examined this morning.  Pleasant elderly Caucasian female, propped up in bed.  Not in distress.  No new symptoms.  Assessment/Plan:  Principal Problem:   Colonic obstruction due to metastatic colon cancer Active Problems:   Essential (primary) hypertension   Diabetes mellitus without complication (HCC)   Iron deficiency anemia   Cancer of splenic flexure of colon   Atrial fibrillation, chronic   Anticoagulant long-term use   Cancer of sigmoid colon    History of adenomatous polyp of colon   Liver metastases from liver cancer   CKD (chronic kidney disease) stage 3, GFR 30-59 ml/min (HCC)   Obesity (BMI 30-39.9)   Choledocholithiasis by MRCP   Status post partial colectomy Feb 2020  Obstructive colonic mass: Status post laparotomy, sigmoid, descending colon and distal transverse colon resection with transverse colostomy on 12/27/2018. Pathology showed invasive moderately differentiated adenocarcinoma. Also concern about liver metastasis. -General surgerycleared for discharge and signed  off. -Cefotetan 2/13 to 2/16. -TPN 2/17-2/21 -Tolerating soft diet.  Although oral appetite is poor. -No plan for chemo in the near future until she recovers per oncology, Dr. Burr Medico. -Encourage out of bed, ambulation. -Encourage incentive spirometry. -PT/OT--SNF -Acute viral prescription for Norco for 5 days.  Enterococcus UTI/bacteriuria: sensitive to ampicillin. -Amoxicillin 2/21-today. No need of antibiotics post discharge.  A. fib with RVR: Now rate controlled. [name] vascular score 5. -Prior to admission, patient was on Cardizem 120 mg daily.  It was held on admission.  In the hospital, metoprolol and digoxin were used.  At discharge, I decided to resume Cardizem. Continue Eliquis from home.              Diastolic CHF exacerbation: Echo on 12/23/2018 with EF of 60 to 65% but no other significant structural or functional abnormalities. CXR on 2/18 with vascular congestion. Diuresed well with IV Lasix - Resumed home Lasix as needed.  Hypernatremia: Resolved. Continue monitoring  Hypomagnesemia: Resolved.  Hypertension: Normotensive. Cardizem and PRN Lasix as above  Mild thrombocytopenia: Resolved.  Anemia secondary to GI bleed/chronic disease: Hgb 10--> 8.2-->--> 7.6--> 1 units---8.9.  DM2: CBG fairly controlled. Prior to admission, patient was on Levemir 7 units daily and Victoza.   -Patient was started on Lantus 10 units twice daily on 2/21.  Because of poor oral appetite, blood sugar has been running low.  Reduce Lantus to 10 units daily.  Continue sliding scale insulin.  Mobility: Encourage out of bed Diet: Oral soft diet DVT prophylaxis:  Eliquis Code Status:   Code Status: Full Code  Family Communication:  Family not at bedside.  Spoke with son yesterday. Disposition Plan:  Pending insurance authorization for SNF placement  Antimicrobials:  Anti-infectives (From admission, onward)   Start  Dose/Rate Route Frequency Ordered Stop   01/03/19 1000  amoxicillin  (AMOXIL) capsule 500 mg     500 mg Oral Every 8 hours 01/03/19 0835     01/03/19 0800  ampicillin-sulbactam (UNASYN) 1.5 g in sodium chloride 0.9 % 100 mL IVPB  Status:  Discontinued     1.5 g 200 mL/hr over 30 Minutes Intravenous Every 6 hours 01/03/19 0738 01/03/19 0834   12/29/18 1000  cefoTEtan (CEFOTAN) 2 g in sodium chloride 0.9 % 100 mL IVPB  Status:  Discontinued     2 g 200 mL/hr over 30 Minutes Intravenous Every 24 hours 12/28/18 1654 12/29/18 0945   12/29/18 1000  cefoTEtan (CEFOTAN) 2 g in sodium chloride 0.9 % 100 mL IVPB  Status:  Discontinued     2 g 200 mL/hr over 30 Minutes Intravenous Every 12 hours 12/29/18 0945 12/30/18 0738   12/27/18 2200  cefoTEtan (CEFOTAN) 2 g in sodium chloride 0.9 % 100 mL IVPB  Status:  Discontinued     2 g 200 mL/hr over 30 Minutes Intravenous Every 12 hours 12/27/18 1449 12/28/18 1654   12/27/18 1039  sodium chloride 0.9 % with cefoTEtan (CEFOTAN) ADS Med    Note to Pharmacy:  Chrystine Oiler: cabinet override      12/27/18 1039 12/27/18 1130   12/26/18 1230  cefoTEtan (CEFOTAN) 2 g in sodium chloride 0.9 % 100 mL IVPB     2 g 200 mL/hr over 30 Minutes Intravenous On call to O.R. 12/26/18 1224 12/27/18 0559      Infusions: None   Scheduled Meds: . acetaminophen  650 mg Oral Q6H  . amoxicillin  500 mg Oral Q8H  . apixaban  5 mg Oral BID  . chlorhexidine  15 mL Mouth Rinse BID  . digoxin  0.125 mg Oral Daily  . feeding supplement (ENSURE ENLIVE)  237 mL Oral Q24H  . feeding supplement (PRO-STAT SUGAR FREE 64)  30 mL Oral TID BM  . insulin aspart  0-20 Units Subcutaneous TID WC  . insulin aspart  0-5 Units Subcutaneous QHS  . insulin glargine  10 Units Subcutaneous QHS  . lip balm  1 application Topical BID  . mouth rinse  15 mL Mouth Rinse q12n4p  . metoprolol tartrate  25 mg Oral BID  . oxybutynin  20 mg Oral Daily  . pantoprazole  40 mg Oral Daily  . sertraline  100 mg Oral Daily  . sodium chloride flush  10-40 mL  Intracatheter Q12H    PRN meds: hydrALAZINE, menthol-cetylpyridinium, ondansetron (ZOFRAN) IV, oxyCODONE, prochlorperazine   Objective: Vitals:   01/09/19 0605 01/09/19 1431  BP: 130/71 109/68  Pulse: 89 61  Resp: 18 16  Temp: 98.3 F (36.8 C) 98.2 F (36.8 C)  SpO2: 100% 94%    Intake/Output Summary (Last 24 hours) at 01/09/2019 1545 Last data filed at 01/09/2019 1112 Gross per 24 hour  Intake 840 ml  Output 875 ml  Net -35 ml   Filed Weights   01/08/19 0453 01/08/19 0743 01/09/19 0500  Weight: 80.1 kg 79.4 kg 79.7 kg   Body mass index is 31.13 kg/m.   Physical Exam: General exam: Appears calm and comfortable.  Pleasant elderly female Skin: No rashes, lesions or ulcers. HEENT: Oral exam normal Respiratory system: Clear to auscultation bilaterally, diminished air entry on both bases Cardiovascular system: Regular rate and rhythm, no murmur Gastrointestinal system: Soft, nontender, colostomy bag with liquid stool, bowel sounds present Central nervous system: Alert, awake, oriented to  place and person Psychiatry: Mood & affect appropriate.  Extremities: No pedal edema, no calf tenderness  Data Review: I have personally reviewed the laboratory data and studies available.  Recent Labs  Lab 01/05/19 0356 01/06/19 0352 01/07/19 0419 01/08/19 0330 01/09/19 0452  WBC 15.1* 14.4* 13.1* 10.9* 9.0  HGB 9.2* 8.9* 9.0* 8.6* 8.7*  HCT 31.1* 29.4* 30.2* 28.7* 29.7*  MCV 98.7 97.4 98.1 98.3 99.0  PLT 206 238 297 324 375   Recent Labs  Lab 01/03/19 0259 01/04/19 0309 01/05/19 0356 01/06/19 0352 01/07/19 0419 01/08/19 0330 01/09/19 0452  NA 139 140 142 141 142  --   --   K 4.5 5.0 4.7 4.3 4.1  --   --   CL 96* 98 99 100 102  --   --   CO2 34* 36* 32 35* 34*  --   --   GLUCOSE 290* 108* 103* 109* 123*  --   --   BUN 27* 30* 37* 34* 33*  --   --   CREATININE 0.74 0.77 0.96 0.80 0.68  --   --   CALCIUM 7.4* 7.6* 7.7* 7.6* 7.8*  --   --   MG 1.7  --  1.8 1.7 1.7 1.7  1.7  PHOS 3.4  --   --   --   --   --   --     Terrilee Croak, MD  [facility] 01/09/2019

## 2019-01-09 NOTE — Progress Notes (Signed)
Clinical Social Worker following patient for support and discharge needs. Patient at this time is waiting on authorization through [facility]. CSW will continue to follow for support.    Rhea Pink, MSW,  [place]

## 2019-01-09 NOTE — Care Management Important Message (Signed)
Important Message  Patient Details  Name: Cassandra Allen MRN: [id number] Date of Birth: 05-19-1946   Medicare Important Message Given:  Yes    Kerin Salen 01/09/2019, 12:16 [place] Message  Patient Details  Name: Cassandra Allen MRN: [id number] Date of Birth: 06/30/1946   Medicare Important Message Given:  Yes    Kerin Salen 01/09/2019, 12:16 PM

## 2019-01-10 DIAGNOSIS — Z483 Aftercare following surgery for neoplasm: Secondary | ICD-10-CM | POA: Diagnosis not present

## 2019-01-10 DIAGNOSIS — Z433 Encounter for attention to colostomy: Secondary | ICD-10-CM | POA: Diagnosis not present

## 2019-01-10 DIAGNOSIS — N183 Chronic kidney disease, stage 3 (moderate): Secondary | ICD-10-CM | POA: Diagnosis not present

## 2019-01-10 DIAGNOSIS — R072 Precordial pain: Secondary | ICD-10-CM | POA: Diagnosis not present

## 2019-01-10 DIAGNOSIS — C187 Malignant neoplasm of sigmoid colon: Secondary | ICD-10-CM | POA: Diagnosis not present

## 2019-01-10 DIAGNOSIS — R000 Tachycardia, unspecified: Secondary | ICD-10-CM | POA: Diagnosis present

## 2019-01-10 DIAGNOSIS — Z87898 Personal history of other specified conditions: Secondary | ICD-10-CM | POA: Diagnosis not present

## 2019-01-10 DIAGNOSIS — I10 Essential (primary) hypertension: Secondary | ICD-10-CM | POA: Diagnosis not present

## 2019-01-10 DIAGNOSIS — I4821 Permanent atrial fibrillation: Secondary | ICD-10-CM | POA: Diagnosis not present

## 2019-01-10 DIAGNOSIS — R627 Adult failure to thrive: Secondary | ICD-10-CM | POA: Diagnosis present

## 2019-01-10 DIAGNOSIS — T8143XA Infection following a procedure, organ and space surgical site, initial encounter: Secondary | ICD-10-CM | POA: Diagnosis not present

## 2019-01-10 DIAGNOSIS — E876 Hypokalemia: Secondary | ICD-10-CM | POA: Diagnosis not present

## 2019-01-10 DIAGNOSIS — K0889 Other specified disorders of teeth and supporting structures: Secondary | ICD-10-CM | POA: Diagnosis present

## 2019-01-10 DIAGNOSIS — D72829 Elevated white blood cell count, unspecified: Secondary | ICD-10-CM | POA: Diagnosis not present

## 2019-01-10 DIAGNOSIS — I482 Chronic atrial fibrillation, unspecified: Secondary | ICD-10-CM | POA: Diagnosis not present

## 2019-01-10 DIAGNOSIS — M545 Low back pain: Secondary | ICD-10-CM | POA: Diagnosis present

## 2019-01-10 DIAGNOSIS — R188 Other ascites: Secondary | ICD-10-CM | POA: Diagnosis not present

## 2019-01-10 DIAGNOSIS — C186 Malignant neoplasm of descending colon: Secondary | ICD-10-CM | POA: Diagnosis not present

## 2019-01-10 DIAGNOSIS — R262 Difficulty in walking, not elsewhere classified: Secondary | ICD-10-CM | POA: Diagnosis not present

## 2019-01-10 DIAGNOSIS — I6782 Cerebral ischemia: Secondary | ICD-10-CM | POA: Diagnosis not present

## 2019-01-10 DIAGNOSIS — C185 Malignant neoplasm of splenic flexure: Secondary | ICD-10-CM | POA: Diagnosis not present

## 2019-01-10 DIAGNOSIS — K651 Peritoneal abscess: Secondary | ICD-10-CM | POA: Diagnosis not present

## 2019-01-10 DIAGNOSIS — E669 Obesity, unspecified: Secondary | ICD-10-CM | POA: Diagnosis present

## 2019-01-10 DIAGNOSIS — Z794 Long term (current) use of insulin: Secondary | ICD-10-CM | POA: Diagnosis not present

## 2019-01-10 DIAGNOSIS — C787 Secondary malignant neoplasm of liver and intrahepatic bile duct: Secondary | ICD-10-CM | POA: Diagnosis not present

## 2019-01-10 DIAGNOSIS — Y836 Removal of other organ (partial) (total) as the cause of abnormal reaction of the patient, or of later complication, without mention of misadventure at the time of the procedure: Secondary | ICD-10-CM | POA: Diagnosis present

## 2019-01-10 DIAGNOSIS — I130 Hypertensive heart and chronic kidney disease with heart failure and stage 1 through stage 4 chronic kidney disease, or unspecified chronic kidney disease: Secondary | ICD-10-CM | POA: Diagnosis not present

## 2019-01-10 DIAGNOSIS — K56609 Unspecified intestinal obstruction, unspecified as to partial versus complete obstruction: Secondary | ICD-10-CM | POA: Diagnosis not present

## 2019-01-10 DIAGNOSIS — M6281 Muscle weakness (generalized): Secondary | ICD-10-CM | POA: Diagnosis not present

## 2019-01-10 DIAGNOSIS — D631 Anemia in chronic kidney disease: Secondary | ICD-10-CM | POA: Diagnosis present

## 2019-01-10 DIAGNOSIS — R5381 Other malaise: Secondary | ICD-10-CM | POA: Diagnosis not present

## 2019-01-10 DIAGNOSIS — I5033 Acute on chronic diastolic (congestive) heart failure: Secondary | ICD-10-CM | POA: Diagnosis not present

## 2019-01-10 DIAGNOSIS — E1122 Type 2 diabetes mellitus with diabetic chronic kidney disease: Secondary | ICD-10-CM | POA: Diagnosis present

## 2019-01-10 DIAGNOSIS — R41841 Cognitive communication deficit: Secondary | ICD-10-CM | POA: Diagnosis not present

## 2019-01-10 DIAGNOSIS — R1030 Lower abdominal pain, unspecified: Secondary | ICD-10-CM | POA: Diagnosis not present

## 2019-01-10 DIAGNOSIS — D500 Iron deficiency anemia secondary to blood loss (chronic): Secondary | ICD-10-CM | POA: Diagnosis not present

## 2019-01-10 DIAGNOSIS — I6781 Acute cerebrovascular insufficiency: Secondary | ICD-10-CM | POA: Diagnosis not present

## 2019-01-10 DIAGNOSIS — E43 Unspecified severe protein-calorie malnutrition: Secondary | ICD-10-CM | POA: Diagnosis not present

## 2019-01-10 DIAGNOSIS — R1084 Generalized abdominal pain: Secondary | ICD-10-CM | POA: Diagnosis not present

## 2019-01-10 DIAGNOSIS — B952 Enterococcus as the cause of diseases classified elsewhere: Secondary | ICD-10-CM | POA: Diagnosis present

## 2019-01-10 DIAGNOSIS — G8918 Other acute postprocedural pain: Secondary | ICD-10-CM | POA: Diagnosis not present

## 2019-01-10 DIAGNOSIS — D630 Anemia in neoplastic disease: Secondary | ICD-10-CM | POA: Diagnosis present

## 2019-01-10 DIAGNOSIS — Z862 Personal history of diseases of the blood and blood-forming organs and certain disorders involving the immune mechanism: Secondary | ICD-10-CM | POA: Diagnosis not present

## 2019-01-10 DIAGNOSIS — E785 Hyperlipidemia, unspecified: Secondary | ICD-10-CM | POA: Diagnosis present

## 2019-01-10 DIAGNOSIS — I4891 Unspecified atrial fibrillation: Secondary | ICD-10-CM | POA: Diagnosis not present

## 2019-01-10 DIAGNOSIS — R52 Pain, unspecified: Secondary | ICD-10-CM | POA: Diagnosis not present

## 2019-01-10 DIAGNOSIS — Z7901 Long term (current) use of anticoagulants: Secondary | ICD-10-CM | POA: Diagnosis not present

## 2019-01-10 DIAGNOSIS — E1165 Type 2 diabetes mellitus with hyperglycemia: Secondary | ICD-10-CM | POA: Diagnosis not present

## 2019-01-10 DIAGNOSIS — E119 Type 2 diabetes mellitus without complications: Secondary | ICD-10-CM | POA: Diagnosis not present

## 2019-01-10 DIAGNOSIS — T8149XA Infection following a procedure, other surgical site, initial encounter: Secondary | ICD-10-CM | POA: Diagnosis not present

## 2019-01-10 DIAGNOSIS — F329 Major depressive disorder, single episode, unspecified: Secondary | ICD-10-CM | POA: Diagnosis not present

## 2019-01-10 LAB — CBC
HCT: 29.9 % — ABNORMAL LOW (ref 36.0–46.0)
Hemoglobin: 8.9 g/dL — ABNORMAL LOW (ref 12.0–15.0)
MCH: 29.1 pg (ref 26.0–34.0)
MCHC: 29.8 g/dL — ABNORMAL LOW (ref 30.0–36.0)
MCV: 97.7 fL (ref 80.0–100.0)
Platelets: 412 10*3/uL — ABNORMAL HIGH (ref 150–400)
RBC: 3.06 MIL/uL — AB (ref 3.87–5.11)
RDW: 15.4 % (ref 11.5–15.5)
WBC: 10.2 10*3/uL (ref 4.0–10.5)
nRBC: 0 % (ref 0.0–0.2)

## 2019-01-10 LAB — GLUCOSE, CAPILLARY
Glucose-Capillary: 92 mg/dL (ref 70–99)
Glucose-Capillary: 129 mg/dL — ABNORMAL HIGH (ref 70–99)
Glucose-Capillary: 80 mg/dL (ref 70–99)

## 2019-01-10 MED ORDER — DILTIAZEM HCL ER COATED BEADS 120 MG PO CP24
120.0000 mg | ORAL_CAPSULE | Freq: Every day | ORAL | Status: DC
  Administered 2019-01-10: 120 mg via ORAL
  Filled 2019-01-10: qty 1

## 2019-01-10 MED ORDER — INSULIN GLARGINE 100 UNIT/ML ~~LOC~~ SOLN: 10.0000 [IU] | Freq: Every day | SUBCUTANEOUS | 11 refills | Status: DC

## 2019-01-10 MED ORDER — HEPARIN SOD (PORK) LOCK FLUSH 100 UNIT/ML IV SOLN
500.0000 [IU] | INTRAVENOUS | Status: AC | PRN
  Administered 2019-01-10: 500 [IU]

## 2019-01-10 NOTE — Progress Notes (Signed)
Attempted to call report to [facility].  No answer.  Will attempt again

## 2019-01-10 NOTE — Progress Notes (Signed)
Occupational Therapy Treatment Patient Details Name: Cassandra Allen MRN: [id number] DOB: [birth year] Today's Date: 01/10/2019    History of present illness 73 year old woman with hx AFib on eliquis, CHF, DM, and newly dx metastatic colon ca 10/25/18 and currently status post exploratory laparotomy with partial colectomy and transverse colostomy on 12/27/18    OT comments  Pt will need ST SNF.  Follow Up Recommendations  SNF    Equipment Recommendations  None recommended by OT    Recommendations for Other Services      Precautions / Restrictions Precautions Precautions: Fall Precaution Comments: recent ABD surgery and new colostomy Restrictions Weight Bearing Restrictions: No              ADL either performed or assessed with clinical judgement   ADL Overall ADL's : Needs assistance/impaired     Grooming: Minimal assistance;Sitting;Brushing hair;Wash/dry face;Oral care                                 General ADL Comments: Pt will need ST SNF.  Pt agrees. Pt does not have A as home with ADL activity      Vision Patient Visual Report: No change from baseline            Cognition Arousal/Alertness: Awake/alert Behavior During Therapy: WFL for tasks assessed/performed Overall Cognitive Status: Within Functional Limits for tasks assessed                                 General Comments: HIGH anxiety/fear of falling with mobility        Exercises General Exercises - Upper Extremity Shoulder Flexion: AROM;10 reps;Both Shoulder Horizontal ADduction: AROM;10 reps;Both Elbow Flexion: AROM;5 reps;Both Wrist Flexion: AROM;Both Wrist Extension: AROM;Both;20 reps Digit Composite Flexion: AROM;Both;20 reps Composite Extension: AROM;Both;15 reps   Shoulder Instructions  Exp.ained importance of strengthening BUe to increase I with transfers for ADL activity- pt agreed     General Comments      Pertinent Vitals/ Pain       Pain  Assessment: Faces Faces Pain Scale: Hurts little more Pain Location: ABD Pain Descriptors / Indicators: Aching;Discomfort;Sore Pain Intervention(s): Limited activity within patient's tolerance;Monitored during session;Repositioned         Frequency  Min 2X/week        Progress Toward Goals  OT Goals(current goals can now be found in the care plan section)  Progress towards OT goals: Progressing toward goals     Plan Discharge plan remains appropriate       AM-PAC OT "6 Clicks" Daily Activity     Outcome Measure   Help from another person eating meals?: A Little Help from another person taking care of personal grooming?: A Little Help from another person toileting, which includes using toliet, bedpan, or urinal?: Total Help from another person bathing (including washing, rinsing, drying)?: A Lot Help from another person to put on and taking off regular upper body clothing?: A Lot Help from another person to put on and taking off regular lower body clothing?: Total 6 Click Score: 12    End of Session    OT Visit Diagnosis: Unsteadiness on feet (R26.81);Muscle weakness (generalized) (M62.81);Other abnormalities of gait and mobility (R26.89);Pain Pain - part of body: (abdomen)   Activity Tolerance Patient tolerated treatment well   Patient Left in chair;with call bell/phone within reach;with chair alarm set  Nurse Communication Mobility status        Time: 0370-9643 OT Time Calculation (min): 14 min  Charges: OT General Charges $OT Visit: 1 Visit OT Treatments $Self Care/Home Management : 8-22 mins  Kari Baars, [place] [phone] Office- [phone], Edwena Felty D 01/10/2019, 3:38 PM

## 2019-01-10 NOTE — Progress Notes (Signed)
Per SNF, [employer] is requesting updated PT/OT notes. CSW sent updated PT notes. Patient will need updated OT notes. OT therapist informed.   Kathrin Greathouse, Marlinda Mike, MSW Clinical Social Worker  [phone] 01/10/2019  3:09 PM

## 2019-01-10 NOTE — Clinical Social Work Placement (Signed)
[facility] received.  D/C Summary sent.  Nurse call report to: [phone] [facility] arranged for transport.   CLINICAL SOCIAL WORK PLACEMENT  NOTE  Date:  01/10/2019  Patient Details  Name: Cassandra Allen MRN: [id number] Date of Birth: 01-06-1946  Clinical Social Work is seeking post-discharge placement for this patient at the [place] level of care (*CSW will initial, date and re-position this form in  chart as items are completed):  Yes   Patient/family provided with [place] Work Department's list of facilities offering this level of care within the geographic area requested by the patient (or if unable, by the patient's family).  Yes   Patient/family informed of their freedom to choose among providers that offer the needed level of care, that participate in Medicare, Medicaid or managed care program needed by the patient, have an available bed and are willing to accept the patient.  Yes   Patient/family informed of [facility]'s ownership interest in [facility] and [facility], as well as of the fact that they are under no obligation to receive care at these facilities.  PASRR submitted to EDS on       PASRR number received on       Existing PASRR number confirmed on 01/08/19     FL2 transmitted to all facilities in geographic area requested by pt/family on       FL2 transmitted to all facilities within larger geographic area on 01/10/19     Patient informed that his/her managed care company has contracts with or will negotiate with certain facilities, including the following:  [facility]     Yes   Patient/family informed of bed offers received.  Patient chooses bed at [facility] recommends and patient chooses bed at      Patient to be transferred to [facility] on 01/10/19.  Patient to be transferred to facility by [facility]      Patient  family notified on 01/10/19 of transfer.  Name of family member notified:  Patient to notify famly.      PHYSICIAN       Additional Comment:    _______________________________________________ Lia Hopping, LCSW 01/10/2019, 4:43 PM

## 2019-01-10 NOTE — Progress Notes (Signed)
PROGRESS NOTE  Cassandra Allen:[id number] DOB: 1946-05-19 DOA: 12/25/2018 PCP: [name], MD   LOS: 15 days   Brief narrative: Patient is a49 year old female with history of newly diagnosed colon cancer on 10/25/2018 with metastasis started on FOLFOX biweekly on 12/04/2018, iron deficiency anemia, DM 2, hypertension, CKD and atrial fibrillation on anticoagulation. Shepresentedon 2/13/20with abdominal pain andwasfound to have obstructing colon mass. On 2/14, patientunderwentlaparotomy with resection of sigmoid colon, descending colon and distal transverse colon, and colostomy. Pathology showed moderately differentiated adenocarcinoma. Oncologywasconsulted and is not planning for chemotherapy in the near future. Hospitalizationwascomplicated by hypotension, anemia, and A. fib with RVR  See individual problem list below for more.  Subjective: Patient was seen and examined this morning.  Not on oxygen supplementation this morning.  No new symptoms.  Assessment/Plan:  Principal Problem:   Colonic obstruction due to metastatic colon cancer Active Problems:   Essential (primary) hypertension   Diabetes mellitus without complication (HCC)   Iron deficiency anemia   Cancer of splenic flexure of colon   Atrial fibrillation, chronic   Anticoagulant long-term use   Cancer of sigmoid colon    History of adenomatous polyp of colon   Liver metastases from liver cancer   CKD (chronic kidney disease) stage 3, GFR 30-59 ml/min (HCC)   Obesity (BMI 30-39.9)   Choledocholithiasis by MRCP   Status post partial colectomy Feb 2020  Metastatic adenocarcinoma: Presented as obstructive colonic mass. On 12/27/2018, patient underwent laparotomy, sigmoid, descending colon and distal transverse colon resection with transverse colostomy. Postsurgically, patient required TPN (2/17-2/21). Currently on soft diet but oral appetite is poor. Pathology showed invasive moderately  differentiated adenocarcinoma. Also concern about liver metastasis. No plan for chemo in the near future until she recovers per oncology, Dr. Burr Medico.  Continue pain management with oxycodone as needed.  Enterococcus UTI/bacteriuria: Currently on amoxicillin.  No need to continue post discharge.  A. fib with RVR: Now rate controlled. [name] vascular score 5. Prior to admission, patient was on Cardizem 120 mg daily. It was held on admission. In the hospital, metoprolol and digoxin were used.  I switched her back toCardizem at 120 mg daily. Continue Eliquis for anticoagulation.  Diastolic CHF exacerbation: Echo on 12/23/2018 with EF of 60 to 65% but no other significant structuralorfunctional abnormalities. CXR on 2/18 with vascular congestion. Diuresed well with IV Lasix - Resumedhome Lasixas needed.  Hypernatremia: Resolved. Continue monitoring  Hypomagnesemia: Resolved.  Hypertension: Normotensive. CardizemandPRNLasix as above  Mild thrombocytopenia: Resolved.  Anemia secondary to GI bleed/chronic disease: Hemoglobin has been stable between 8-9.  DM2: CBG fairly controlled. Prior to admission, patient was on Levemir 7 units daily and Victoza. In the hospital, blood sugar level was controlled with Lantus and sliding scale insulin.  Currently on Lantus 10 units at bedtime.   Because of poor oral intake, blood sugar remains mostly controlled and is not requiring sliding scale insulin.   Mobility: Encourage out of bed Diet: Oral soft diet DVT prophylaxis: Eliquis Code Status:  Code Status: Full Code  Family Communication: Family not at bedside.  Disposition Plan: Pending insurance authorization for SNF placement  Antimicrobials:  Anti-infectives (From admission, onward)   Start     Dose/Rate Route Frequency Ordered Stop   01/03/19 1000  amoxicillin (AMOXIL) capsule 500 mg     500 mg Oral Every 8 hours 01/03/19 0835     01/03/19 0800  ampicillin-sulbactam  (UNASYN) 1.5 g in sodium chloride 0.9 % 100 mL IVPB  Status:  Discontinued  1.5 g 200 mL/hr over 30 Minutes Intravenous Every 6 hours 01/03/19 0738 01/03/19 0834   12/29/18 1000  cefoTEtan (CEFOTAN) 2 g in sodium chloride 0.9 % 100 mL IVPB  Status:  Discontinued     2 g 200 mL/hr over 30 Minutes Intravenous Every 24 hours 12/28/18 1654 12/29/18 0945   12/29/18 1000  cefoTEtan (CEFOTAN) 2 g in sodium chloride 0.9 % 100 mL IVPB  Status:  Discontinued     2 g 200 mL/hr over 30 Minutes Intravenous Every 12 hours 12/29/18 0945 12/30/18 0738   12/27/18 2200  cefoTEtan (CEFOTAN) 2 g in sodium chloride 0.9 % 100 mL IVPB  Status:  Discontinued     2 g 200 mL/hr over 30 Minutes Intravenous Every 12 hours 12/27/18 1449 12/28/18 1654   12/27/18 1039  sodium chloride 0.9 % with cefoTEtan (CEFOTAN) ADS Med    Note to Pharmacy:  Chrystine Oiler: cabinet override      12/27/18 1039 12/27/18 1130   12/26/18 1230  cefoTEtan (CEFOTAN) 2 g in sodium chloride 0.9 % 100 mL IVPB     2 g 200 mL/hr over 30 Minutes Intravenous On call to O.R. 12/26/18 1224 12/27/18 0559      Infusions:    Scheduled Meds: . acetaminophen  650 mg Oral Q6H  . amoxicillin  500 mg Oral Q8H  . apixaban  5 mg Oral BID  . chlorhexidine  15 mL Mouth Rinse BID  . diltiazem  120 mg Oral Daily  . feeding supplement (ENSURE ENLIVE)  237 mL Oral Q24H  . feeding supplement (PRO-STAT SUGAR FREE 64)  30 mL Oral TID BM  . insulin aspart  0-20 Units Subcutaneous TID WC  . insulin aspart  0-5 Units Subcutaneous QHS  . insulin glargine  10 Units Subcutaneous QHS  . lip balm  1 application Topical BID  . mouth rinse  15 mL Mouth Rinse q12n4p  . oxybutynin  20 mg Oral Daily  . pantoprazole  40 mg Oral Daily  . sertraline  100 mg Oral Daily  . sodium chloride flush  10-40 mL Intracatheter Q12H    PRN meds: hydrALAZINE, menthol-cetylpyridinium, ondansetron (ZOFRAN) IV, oxyCODONE, prochlorperazine   Objective: Vitals:   01/10/19  0607 01/10/19 1430  BP: 135/74 125/65  Pulse: 84 93  Resp: 20 16  Temp: 98.7 F (37.1 C)   SpO2: 92% 97%    Intake/Output Summary (Last 24 hours) at 01/10/2019 1431 Last data filed at 01/10/2019 1000 Gross per 24 hour  Intake 480 ml  Output 1050 ml  Net -570 ml   Filed Weights   01/08/19 0743 01/09/19 0500 01/10/19 0643  Weight: 79.4 kg 79.7 kg 78.9 kg   Body mass index is 30.81 kg/m.   Physical Exam: General exam: Appears calm and comfortable.  Skin: No rashes, lesions or ulcers. HEENT: Normal oral exam, not on oxygen supplementation today Respiratory system: Clear to auscultation bilaterally Cardiovascular system: Regular rate and rhythm, no murmur Gastrointestinal system: Colostomy bag with semi-formed stool Central nervous system: Alert, awake, oriented to place and person Psychiatry: Mood & affect appropriate.  Extremities: No pedal edema, no calf tenderness  Data Review: I have personally reviewed the laboratory data and studies available.  Recent Labs  Lab 01/06/19 0352 01/07/19 0419 01/08/19 0330 01/09/19 0452 01/10/19 0357  WBC 14.4* 13.1* 10.9* 9.0 10.2  HGB 8.9* 9.0* 8.6* 8.7* 8.9*  HCT 29.4* 30.2* 28.7* 29.7* 29.9*  MCV 97.4 98.1 98.3 99.0 97.7  PLT 238 297  324 375 412*   Recent Labs  Lab 01/04/19 0309 01/05/19 0356 01/06/19 0352 01/07/19 0419 01/08/19 0330 01/09/19 0452  NA 140 142 141 142  --   --   K 5.0 4.7 4.3 4.1  --   --   CL 98 99 100 102  --   --   CO2 36* 32 35* 34*  --   --   GLUCOSE 108* 103* 109* 123*  --   --   BUN 30* 37* 34* 33*  --   --   CREATININE 0.77 0.96 0.80 0.68  --   --   CALCIUM 7.6* 7.7* 7.6* 7.8*  --   --   MG  --  1.8 1.7 1.7 1.7 1.7    Terrilee Croak, MD  [facility] 01/10/2019

## 2019-01-10 NOTE — Progress Notes (Signed)
Per CSW need OT to see patient for updated notes to d/c to SNF-Nsg aware.CM has sent page to OT.

## 2019-01-10 NOTE — Progress Notes (Signed)
Physical Therapy Treatment Patient Details Name: Cassandra Allen MRN: [id number] DOB: Nov 04, 1946 Today's Date: 01/10/2019    History of Present Illness 73 year old woman with hx AFib on eliquis, CHF, DM, and newly dx metastatic colon ca 10/25/18 and currently status post exploratory laparotomy with partial colectomy and transverse colostomy on 12/27/18     PT Comments    POD # 14 LAP Pt AxO x 4 initially flat affect.  Assisted OOB to recliner required much assist.    General bed mobility comments: required increased assist to transfer from supine to EOB.  Once EOB pt able to statid sit at Supervision level.  General transfer comment: attempted sit to stand with walker however pt unable to lift hips off bed but 15%.  so attempted without walker + 2 side by side pt resisted with HIGH ANXIETY and fear.  So "Bear Hug" 1/4 pivot from elevated bed to [facility] Total Assist.  Pt unable to stand erect and unable to support self self standing.  Unable to amb at this time. Pt will need ST Rehab at SNF to regain prior level of mobility.   Follow Up Recommendations  SNF     Equipment Recommendations  None recommended by PT    Recommendations for Other Services       Precautions / Restrictions Precautions Precautions: Fall Precaution Comments: recent ABD surgery and new colostomy Restrictions Weight Bearing Restrictions: No    Mobility  Bed Mobility Overal bed mobility: Needs Assistance Bed Mobility: Supine to Sit     Supine to sit: Max assist;+2 for physical assistance;HOB elevated;Total assist     General bed mobility comments: required increased assist to transfer from supine to EOB.  Once EOB pt able to statid sit at Supervision level.    Transfers Overall transfer level: Needs assistance Equipment used: Rolling walker (2 wheeled);None(Bear Hug) Transfers: Sit to/from Stand Sit to Stand: Max assist;Total assist;+2 physical assistance;+2 safety/equipment;From elevated  surface Stand pivot transfers: Total assist;+2 physical assistance;+2 safety/equipment;From elevated surface       General transfer comment: attempted sit to stand with walker however pt unable to lift hips off bed but 15%.  so attempted without walker + 2 side by side pt resisted with HIGH ANXIETY and fear.  So "Bear Hug" 1/4 pivot from elevated bed to [facility] Total Assist.  Pt unable to stand erect and unable to support self self standing.    Ambulation/Gait             General Gait Details: unable to attempt due to low transfer ability   Stairs             Wheelchair Mobility    Modified Rankin (Stroke Patients Only)       Balance                                            Cognition Arousal/Alertness: Awake/alert Behavior During Therapy: WFL for tasks assessed/performed Overall Cognitive Status: Within Functional Limits for tasks assessed                                 General Comments: HIGH anxiety/fear of falling with mobility      Exercises      General Comments        Pertinent Vitals/Pain Pain Assessment: Faces Faces Pain  Scale: Hurts even more Pain Location: ABD Pain Descriptors / Indicators: Discomfort;Grimacing Pain Intervention(s): Monitored during session    Home Living                      Prior Function            PT Goals (current goals can now be found in the care plan section) Progress towards PT goals: Progressing toward goals    Frequency    Min 2X/week      PT Plan Current plan remains appropriate    Co-evaluation              AM-PAC PT "6 Clicks" Mobility   Outcome Measure  Help needed turning from your back to your side while in a flat bed without using bedrails?: Total Help needed moving from lying on your back to sitting on the side of a flat bed without using bedrails?: Total Help needed moving to and from a bed to a chair (including a wheelchair)?: Total Help  needed standing up from a chair using your arms (e.g., wheelchair or bedside chair)?: Total Help needed to walk in hospital room?: Total Help needed climbing 3-5 steps with a railing? : Total 6 Click Score: 6    End of Session Equipment Utilized During Treatment: Gait belt Activity Tolerance: Patient limited by fatigue Patient left: with call bell/phone within reach;in bed;with bed alarm set Nurse Communication: Mobility status;Need for lift equipment(Maxi Move back to bed ) PT Visit Diagnosis: Muscle weakness (generalized) (M62.81)     Time: 1110-1140 PT Time Calculation (min) (ACUTE ONLY): 30 min  Charges:  $Gait Training: 8-22 mins $Therapeutic Activity: 8-22 mins                     Rica Koyanagi  PTA Acute  Rehabilitation Services Pager      [phone] Office      [phone]'

## 2019-01-13 ENCOUNTER — Encounter: Payer: Self-pay | Admitting: Internal Medicine

## 2019-01-13 ENCOUNTER — Non-Acute Institutional Stay (SKILLED_NURSING_FACILITY): Payer: Medicare HMO | Admitting: Internal Medicine

## 2019-01-13 ENCOUNTER — Other Ambulatory Visit: Payer: Self-pay | Admitting: Internal Medicine

## 2019-01-13 DIAGNOSIS — I482 Chronic atrial fibrillation, unspecified: Secondary | ICD-10-CM

## 2019-01-13 DIAGNOSIS — E119 Type 2 diabetes mellitus without complications: Secondary | ICD-10-CM | POA: Diagnosis not present

## 2019-01-13 DIAGNOSIS — D500 Iron deficiency anemia secondary to blood loss (chronic): Secondary | ICD-10-CM

## 2019-01-13 DIAGNOSIS — F32A Depression, unspecified: Secondary | ICD-10-CM

## 2019-01-13 DIAGNOSIS — F329 Major depressive disorder, single episode, unspecified: Secondary | ICD-10-CM | POA: Diagnosis not present

## 2019-01-13 DIAGNOSIS — C185 Malignant neoplasm of splenic flexure: Secondary | ICD-10-CM

## 2019-01-13 MED ORDER — HYDROCODONE-ACETAMINOPHEN 7.5-325 MG PO TABS: 1.0000 | ORAL_TABLET | Freq: Two times a day (BID) | ORAL | 0 refills | Status: DC | PRN

## 2019-01-13 NOTE — Assessment & Plan Note (Addendum)
Chemotherapy is deferred until she is stable following PT/OT and nutritional therapies at the SNF

## 2019-01-13 NOTE — Assessment & Plan Note (Signed)
Diabetic control is been good but much of this relates to anorexia and poor oral intake

## 2019-01-13 NOTE — Assessment & Plan Note (Addendum)
01/13/2019 rhythm and rate are slightly irregular but rate adequately controlled

## 2019-01-13 NOTE — Patient Instructions (Signed)
See assessment and plan under each diagnosis in the problem list and acutely for this visit 

## 2019-01-13 NOTE — Progress Notes (Signed)
Post op pain 

## 2019-01-13 NOTE — Assessment & Plan Note (Signed)
Chronic issue, on high-dose sertraline.  Tramadol was not an option for pain control in the context of the high-dose SSRI.

## 2019-01-13 NOTE — Progress Notes (Signed)
NURSING HOME LOCATION:  [facility] ROOM NUMBER: [id number]   CODE STATUS:  Full Code  PCP:  [name], MD  This is a comprehensive admission note to [place] performed on this date less than 30 days from date of admission. Included are preadmission medical/surgical history; reconciled medication list; family history; social history and comprehensive review of systems.  Corrections and additions to the records were documented. Comprehensive physical exam was also performed. Additionally a clinical summary was entered for each active diagnosis pertinent to this admission in the Problem List to enhance continuity of care.  HPI: Patient was hospitalized 2/12- 01/10/2019, presenting with abdominal pain due to obstructive colon mass.  2/14 laparoscopic procedure was performed with resection of the sigmoid colon, descending colon, distal transverse colon and placement of colostomy for moderately differentiated adenocarcinoma.  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   Course was complicated by hypotension for which her calcium channel blocker was held.  She was initially treated with metoprolol and digoxin for A. fib with rapid ventricular response. Chads vas score was 5.  Chest imaging did reveal vascular congestion on 2/18 which responded to IV Lasix.  She was transitioned back to the Cardizem at discharge along with Eliquis.   She also experienced significant anorexia.  Her diabetes was easily controlled in this context.   Anemia persisted.  She states that she had been receiving iron infusions weekly and had also received 10 transfusions since December 2019.  Past medical and surgical history: Includes insulin-dependent diabetes, chronic depression, hypertension, and PAF.  Social history: Nondrinker, non-smoker.  Family history: MI in both parents.   Review of systems: She continues to have upper abdominal pain for  which she has been taking the opiate. Also has had nausea.  She states she does not know when she goes into atrial fib.  Constitutional: No fever  Eyes: No redness, discharge, pain, vision change ENT/mouth: No nasal congestion, purulent discharge, earache, change in hearing, sore throat  Cardiovascular: No chest pain, palpitations, paroxysmal nocturnal dyspnea, claudication, edema  Respiratory: No cough, sputum production, hemoptysis, DOE, significant snoring, apnea Gastrointestinal: No heartburn, dysphagia, vomiting, melena, change in bowels Genitourinary: No dysuria, hematuria, pyuria, incontinence, nocturia Musculoskeletal: No joint stiffness, joint swelling, weakness, pain Dermatologic: No rash, pruritus, change in appearance of skin Neurologic: No dizziness, headache, syncope, seizures, numbness, tingling Endocrine: No change in hair/skin/nails, excessive thirst, excessive hunger, excessive urination  Hematologic/lymphatic: No significant bruising, lymphadenopathy Allergy/immunology: No itchy/watery eyes, significant sneezing, urticaria, angioedema  Physical exam:  Pertinent or positive findings: She exhibits profound pallor.  Affect is flat.  Eyebrows are thin.  Upper and lower eyelids are puffy.  Pupils are large but reactive to light.  The left upper incisor is missing.  Rhythm and rate are slightly irregular.  Ostomy is present.  Posterior tibial pulses are decreased.  She is weak in all extremities but more so in the upper extremities compared to the lower extremities.  The right index finger nail is deformed. Tenting present.  General appearance:  no acute distress, increased work of breathing is present.   Lymphatic: No lymphadenopathy about the head, neck, axilla. Eyes: No conjunctival inflammation or lid edema is present. There is no scleral icterus. Ears:  External ear exam shows no significant lesions or deformities.   Nose:  External nasal examination shows no deformity or  inflammation. Nasal mucosa are pink and moist without lesions, exudates Oral exam: Lips and gums  are healthy appearing.There is no oropharyngeal erythema or exudate. Neck:  No thyromegaly, masses, tenderness noted.    Heart:  No gallop, murmur, click, rub.  Lungs: Chest clear to auscultation without wheezes, rhonchi, rales, rubs. Abdomen: Bowel sounds are normal.  Abdomen is soft  with no organomegaly, hernias, masses. GU: Deferred  Extremities:  No cyanosis, clubbing, edema. Neurologic exam: Balance, Rhomberg, finger to nose testing could not be completed due to clinical state Skin: Warm & dry. No significant lesions or rash.  See clinical summary under each active problem in the Problem List with associated updated therapeutic plan

## 2019-01-13 NOTE — Assessment & Plan Note (Signed)
Hypochromic anemia essentially stable with hemoglobin 8.9/hematocrit 29.9

## 2019-01-15 ENCOUNTER — Ambulatory Visit: Payer: Self-pay

## 2019-01-15 ENCOUNTER — Ambulatory Visit: Payer: Self-pay | Admitting: Hematology

## 2019-01-15 ENCOUNTER — Other Ambulatory Visit: Payer: Self-pay

## 2019-01-17 ENCOUNTER — Other Ambulatory Visit: Payer: Self-pay | Admitting: Internal Medicine

## 2019-01-17 ENCOUNTER — Encounter: Payer: Self-pay | Admitting: Internal Medicine

## 2019-01-17 ENCOUNTER — Non-Acute Institutional Stay (SKILLED_NURSING_FACILITY): Payer: Medicare HMO | Admitting: Internal Medicine

## 2019-01-17 DIAGNOSIS — K56609 Unspecified intestinal obstruction, unspecified as to partial versus complete obstruction: Secondary | ICD-10-CM

## 2019-01-17 DIAGNOSIS — D500 Iron deficiency anemia secondary to blood loss (chronic): Secondary | ICD-10-CM | POA: Diagnosis not present

## 2019-01-17 DIAGNOSIS — I482 Chronic atrial fibrillation, unspecified: Secondary | ICD-10-CM

## 2019-01-17 NOTE — Progress Notes (Signed)
Location:    [facility] Room Number: 215/A Place of Service:  SNF [phone]) Provider:  Edwinna Areola, MD  Patient Care Team: [name], MD as PCP - General (Family Medicine) Thompson Grayer, MD as PCP - Cardiology (Cardiology) Thompson Grayer, MD as PCP - Electrophysiology (Cardiology) Ronnette Juniper, MD as Consulting Physician (Gastroenterology) Truitt Merle, MD as Consulting Physician (Medical Oncology) Newt Minion, MD as Consulting Physician (Orthopedic Surgery) Leighton Ruff, MD as Consulting Physician (Colon and Rectal Surgery)  Extended Emergency Contact Information Primary Emergency Contact: [place] Phone: [phone] Relation: Son  Code Status:  Full Code Goals of care: Advanced Directive information Advanced Directives 01/17/2019  Does Patient Have a Medical Advance Directive? Yes  Type of Advance Directive (No Data)  Does patient want to make changes to medical advance directive? No - Patient declined  Would patient like information on creating a medical advance directive? No - Patient declined     Chief Complaint  Patient presents with  . Acute Visit    Pain management    HPI:  Pt is a 73 y.o. female seen today for an acute visit for follow-up of pain management.  Patient does have an order for Vicodin 7.5-325 mg twice daily for discomfort.  Patient is here for rehab after hospitalization for abdominal pain secondary to an obstructive colonic mass.  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  Course was complicated by hypotension her calcium channel blocker was held.  She also was treated for A. fib with metoprolol and  digoxin.  She also had vascular congestion which responded to IV Lasix.  She is now on Cardizem as well as Eliquis with her history of atrial fibrillation.  She also has a history of anemia did receive iron transfusions weekly and has had transfusions hemoglobin has shown stability at 8.9 on lab done February 28.  Apparent there was an order for her Vicodin to last 5 days status post hospital discharge.  Nursing was concerned that she still needed the Vicodin- however per review of records it appears she does have additional days of Vicodin through early next week.  She does continue to complain of pain she says the pain is more prominent when she moves-this could be due to her getting therapy and exercising some areas that she has not exercised recently- she is not really complaining of nausea or vomiting she does have output from her bag-.  Currently vital signs appear to be stable systolic is mildly elevated but this does not appear to be consistent.  Heart rate appears to be controlled   Past Medical History:  Diagnosis Date  . Acute diastolic heart failure ([place]) 12/22/2018  . Arthritis   . Atrial fibrillation, chronic 12/22/2018  . Cancer of left colon ([place]) 10/30/2018  . Cancer of sigmoid colon  12/27/2018  . Diabetes mellitus without complication ([place])   . Hypertension   . Obesity (BMI 30-39.9) 12/27/2018   Past Surgical History:  Procedure Laterality Date  . COLONOSCOPY  10/2018   Dr [name].  Large cancer at splenic flexure,  Bulky sigmoid colon mass, Numerous polyps  . IR IMAGING GUIDED PORT INSERTION  11/20/2018  . LAPAROTOMY N/A 12/27/2018   Procedure: LEFT SIGMOID COLECTOMY WITH  HARTMANN POUCH AND END COLOSTOMY;  Surgeon: [name], MD;  Location: [facility];  Service: General;  Laterality: N/A;    Allergies  Allergen Reactions  . Metformin And Related Nausea And Vomiting  . Prednisone Nausea And Vomiting    Outpatient Encounter Medications as of 01/17/2019  Medication  Sig  . apixaban (ELIQUIS) 5 MG TABS tablet TAKE 1 TABLET (5 MG TOTAL) BY MOUTH 2 (TWO) TIMES DAILY.  . bisacodyl (DULCOLAX) 10 MG suppository Constipation ( 2 of 4) If not relieved by MOM give 10 mg Bisacodyl suppository rectally x 1 dose in 24 hours as needed ( (Do not use constipation standing orders for residents with renal failure/CFR less than 30. Contact MD for orders)  . diltiazem (CARDIZEM CD) 120 MG 24 hr capsule Take 1 capsule (120 mg total) by mouth daily.  . furosemide (LASIX) 20 MG tablet Lasix 20 mg po daily PRN for weight of greater than 3 lbs in a day or 5 lbs in a week  . HYDROcodone-acetaminophen (NORCO) 7.5-325 MG tablet Take 1 tablet by mouth 2 (two) times daily as needed for up to 7 days (pain).  . insulin glargine (LANTUS) 100 UNIT/ML injection Inject 0.1 mLs (10 Units total) into the skin at bedtime.  . magnesium hydroxide (MILK OF MAGNESIA) 400 MG/5ML suspension Constipation(1 of 4) If no BM in 3 days give 30 cc Milk of Magnesium  p.o. x 1 dose in 24 hours as needed ( Do not use standing constipation orders for residents with renal failure CFR less than 30. Contact MD for orders)  . NON FORMULARY NSA Med Pass 120 ml po daily  . ondansetron (ZOFRAN) 8 MG tablet Take 8 mg by mouth every 8 (eight) hours as needed for nausea.  Marland Kitchen oxybutynin (DITROPAN-XL) 10 MG 24 hr tablet Take 10 mg by mouth daily.  . sertraline (ZOLOFT) 100 MG tablet Take 100 mg by mouth daily.   . simvastatin (ZOCOR) 20 MG tablet Take 20 mg by mouth at bedtime.  . Sodium Phosphates (RA SALINE ENEMA RE) Constipation ( 3 of 4 ) If not relieved by Bisacodyl suppository give disposable Saline Enema rectally x 1 dose /24 hrs as needed   (Do not use constipation standing orders for residents with renal failure/CFR less than 30. Contact MD for orders)    Constipation (4 of 4): Contact MD as needed if no results from enema (Nursing Measure)  . [DISCONTINUED] feeding supplement, ENSURE ENLIVE, (ENSURE ENLIVE) LIQD Take  237 mLs by mouth daily.  . [DISCONTINUED] simvastatin (ZOCOR) 20 MG tablet Take 20 mg by mouth at bedtime.    No facility-administered encounter medications on file as of 01/17/2019.     Review of Systems   General she is not complaining of fever chills.  Skin does have dressing over the surgical site she says there is some tenderness there per wound care this is stable.  .  Head ears eyes nose mouth and throat is not complain of visual changes or sore throat.  Respiratory is not complaining of shortness of breath or cough.  Cardiac does not complain of chest pain or significantly increased edema.  GI does complain of continued abdominal discomfort she states this is more so when she moves.  Not complaining of vomiting apparently has nausea at times.  GU is not complaining of dysuria.  Musculoskeletal does not really complain of joint pain she does have weakness.  Neurologic positive for weakness does not complain of dizziness headache or syncope.  Psych appears to be a bit anxious does not complain of being depressed she is on Zoloft.    Immunization History  Administered Date(s) Administered  . Influenza-Unspecified 07/24/2018   Pertinent  Health Maintenance Due  Topic Date Due  . MAMMOGRAM  11/07/1996  . FOOT EXAM  02/13/2019 (Originally 11/07/1956)  . OPHTHALMOLOGY EXAM  02/13/2019 (Originally 11/07/1956)  . URINE MICROALBUMIN  02/13/2019 (Originally 11/07/1956)  . DEXA SCAN  02/13/2019 (Originally 11/08/2011)  . PNA vac Low Risk Adult (1 of 2 - PCV13) 02/13/2019 (Originally 11/08/2011)  . HEMOGLOBIN A1C  02/07/2019  . COLONOSCOPY  10/25/2028  . INFLUENZA VACCINE  Completed   No flowsheet data found. Functional Status Survey:    Vitals:   01/17/19 1641  BP: (!) 146/77  Pulse: 63  Resp: 18  Temp: 98.2 F (36.8 C)  TempSrc: Oral  SpO2: 96%    Physical Exam   In general this is a elderly female she does not appear to be in any distress.  Her skin is  warm and dry she does have covering over her surgical site of her abdomen per wound care this is stable.  Eyes visual acuity appears to be intact sclera and conjunctive are clear.  Oropharynx is clear mucous membranes moist.  Chest is clear to auscultation there is no labored breathing.  Heart is largely regular rate and rhythm with some irregular beats- pulse is around 90 on auscultation she does not have significant lower extremity edema.  Abdomen is obese there is some mild tenderness to palpation-she says is more tenderness is present when she moves.  She does have covering over her surgical site- colostomy bag is in place.  Musculoskeletal does have generalized weakness but is able to move all extremities x4.  Neurologic is grossly intact her speech is clear could not appreciate lateralizing findings.  Psych she is alert and oriented pleasant and appropriate does have somewhat of a flat affect Labs reviewed: Recent Labs    01/01/19 0447 01/02/19 0518 01/03/19 0259  01/05/19 0356 01/06/19 0352 01/07/19 0419 01/08/19 0330 01/09/19 0452  NA 146* 143 139   < > 142 141 142  --   --   K 4.1 4.0 4.5   < > 4.7 4.3 4.1  --   --   CL 105 101 96*   < > 99 100 102  --   --   CO2 33* 34* 34*   < > 32 35* 34*  --   --   GLUCOSE 214* 181* 290*   < > 103* 109* 123*  --   --   BUN 31* 25* 27*   < > 37* 34* 33*  --   --   CREATININE 0.68 0.61 0.74   < > 0.96 0.80 0.68  --   --   CALCIUM 7.8* 7.4* 7.4*   < > 7.7* 7.6* 7.8*  --   --   MG 2.0 1.4* 1.7  --  1.8 1.7 1.7 1.7 1.7  PHOS 2.3* 3.1 3.4  --   --   --   --   --   --    < > = values in this interval not displayed.   Recent Labs    12/28/18 0323 12/30/18 0319 12/31/18 0337 01/02/19 0518  AST 38 21 20  --   ALT 25 23 20   --   ALKPHOS 62 87 84  --   BILITOT 0.4 0.9 0.6  --   PROT 4.5* 4.4* 4.7*  --  ALBUMIN 1.9* 1.5* 1.5* 1.7*   Recent Labs    12/28/18 0323 12/29/18 0400 12/30/18 0319  01/08/19 0330 01/09/19 0452  01/10/19 0357  WBC 7.1 5.3 6.8   < > 10.9* 9.0 10.2  NEUTROABS 4.3 3.3 4.6  --   --   --   --   HGB 10.3* 9.5* 9.3*   < > 8.6* 8.7* 8.9*  HCT 33.7* 31.1* 30.0*   < > 28.7* 29.7* 29.9*  MCV 96.0 96.0 96.8   < > 98.3 99.0 97.7  PLT 250 141* 128*   < > 324 375 412*   < > = values in this interval not displayed.   Lab Results  Component Value Date   TSH 1.894 01/03/2019   Lab Results  Component Value Date   HGBA1C 5.6 08/09/2018   Lab Results  Component Value Date   CHOL 125 08/09/2018   HDL 30 (L) 08/09/2018   LDLCALC 68 08/09/2018   TRIG 202 (H) 12/31/2018   CHOLHDL 4.2 08/09/2018    Significant Diagnostic Results in last 30 days:  Ct Abdomen Pelvis Wo Contrast  Result Date: 12/26/2018 CLINICAL DATA:  Abdominal pain, known history of colon carcinoma with liver metastatic disease EXAM: CT ABDOMEN AND PELVIS WITHOUT CONTRAST TECHNIQUE: Multidetector CT imaging of the abdomen and pelvis was performed following the standard protocol without IV contrast. COMPARISON:  MRI from 12/07/2018 and CT from 11/01/2018 FINDINGS: Lower chest: No acute abnormality. Hepatobiliary: Somewhat limited due to lack of IV contrast. Hypodense lesion is again seen in the caudate lobe consistent with the known metastatic disease. Gallbladder is well distended with multiple gallstones within. No biliary ductal dilatation is seen. Density is noted within the distal common bile duct consistent with common bile duct stone similar to that seen on prior MRI. Pancreas: Unremarkable. No pancreatic ductal dilatation or surrounding inflammatory changes. Spleen: Normal in size without focal abnormality. Adrenals/Urinary Tract: Right adrenal gland is within normal limits. Stable left adrenal lesion is seen shown to represent adenoma on prior MRI. The kidneys are within normal limits bilaterally. No renal calculi or obstructive changes are seen. The bladder is partially decompressed. Stomach/Bowel: Scattered diverticular change  of the colon is noted. The transverse and ascending colon are dilated and fluid-filled. There is a caliber change identified at the mid descending colon. This would correspond with the known colonic mass seen on prior colonoscopy. Additionally there is a mass lesion within lumen of the sigmoid colon similar to that seen on prior CT examination and colonoscopy. Small-bowel is significantly distended with fluid and air consistent with the proximal descending colon obstructing lesion. Dilatation extends from the fourth portion of the duodenum to the terminal ileum. Vascular/Lymphatic: Aortic atherosclerosis. No enlarged abdominal or pelvic lymph nodes. Reproductive: Uterus and bilateral adnexa are unremarkable. Other: No abdominal wall hernia or abnormality. No abdominopelvic ascites. Musculoskeletal: Degenerative changes of lumbar spine. IMPRESSION: Changes consistent with an obstructing lesion in the proximal descending colon consistent with the known history of colon carcinoma. The proximal colon as well as the entire small bowel is dilated secondary to the obstructive change. Second intraluminal mass within the sigmoid colon consistent with prior colonoscopy findings. Stable left adrenal lesion shown to represent adenoma on recent MRI. Hepatic metastatic disease similar to the prior MRI. Cholelithiasis without complicating factors. Electronically Signed   By: Inez Catalina M.D.   On: 12/26/2018 01:43   Dg Chest 2 View  Result Date: 12/21/2018 CLINICAL DATA:  Midline chest pain.  Shortness of breath  EXAM: CHEST - 2 VIEW COMPARISON:  Chest radiograph 11/02/2018 FINDINGS: Right anterior chest wall Port-A-Cath is present with tip projecting over the superior vena cava. Monitoring leads overlie the patient. Cardiomegaly. Pulmonary vascular redistribution. Mild interstitial opacities. No pleural effusion. Thoracic spine degenerative changes. IMPRESSION: Cardiomegaly. Pulmonary vascular redistribution and mild  interstitial edema. Electronically Signed   By: Lovey Newcomer M.D.   On: 12/21/2018 21:11   Dg Abd 1 View  Result Date: 12/31/2018 CLINICAL DATA:  73 year old female enteric tube placement. Postoperative day 4 distal large bowel resection due to obstructing tumor. EXAM: ABDOMEN - 1 VIEW COMPARISON:  12/26/2018. FINDINGS: AP supine view at 0120 hours. Enteric tube tip is at the level of the gastric body. The side hole is at the level of the gastric fundus. Resolved gas-filled transverse colon, but continued gas-filled small bowel loops in the lower abdomen and pelvis which are at the upper limits of normal to mildly dilated. New lower abdominal skin staples. Negative lung bases. No acute osseous abnormality identified. IMPRESSION: 1. Enteric tube in the stomach, side hole the level of the fundus. 2. Improved bowel gas pattern since 12/26/2018. Borderline to mildly dilated small bowel loops probably reflect postoperative ileus. Electronically Signed   By: Genevie Ann M.D.   On: 12/31/2018 02:38   Dg Abdomen 1 View  Result Date: 12/26/2018 CLINICAL DATA:  Nasogastric tube placement EXAM: ABDOMEN - 1 VIEW COMPARISON:  Abdominal CT earlier today FINDINGS: Nasogastric tube tip at the distal stomach. Diffuse gaseous bowel distention in this patient with left colonic obstruction by CT. IMPRESSION: 1. Nasogastric tube with tip at the distal stomach. 2. Known descending colonic obstruction. Electronically Signed   By: Monte Fantasia M.D.   On: 12/26/2018 04:18   Ct Abdomen Pelvis W Contrast  Result Date: 01/05/2019 CLINICAL DATA:  Postop infection. Sigmoid colectomy with Hartmann's pouch and colostomy. EXAM: CT ABDOMEN AND PELVIS WITH CONTRAST TECHNIQUE: Multidetector CT imaging of the abdomen and pelvis was performed using the standard protocol following bolus administration of intravenous contrast. CONTRAST:  179mL OMNIPAQUE IOHEXOL 300 MG/ML  SOLN COMPARISON:  12/26/2018 FINDINGS: Lower chest: Small bilateral  pleural effusions. Bibasilar atelectasis. Heart is mildly enlarged. Hepatobiliary: Irregular low-density lesion in the caudate lobe measures up to 3.3 cm compatible with the known metastasis. No biliary ductal dilatation. Possible gallstone noted within the gallbladder. Pancreas: No focal abnormality or ductal dilatation. Spleen: Scattered hypodensities in the spleen, nonspecific. Normal size. Adrenals/Urinary Tract: Left adrenal lesion again noted measuring 3 cm. This is stable. Right adrenal gland is unremarkable. No focal renal abnormality or hydronephrosis. Urinary bladder grossly unremarkable. Stomach/Bowel: Changes of partial colectomy right lower quadrant ostomy noted. Stomach is decompressed. No evidence of bowel obstruction. Vascular/Lymphatic: Aortic atherosclerosis. No enlarged abdominal or pelvic lymph nodes. Reproductive: Uterus and adnexa unremarkable.  No mass. Other: Moderate free fluid in the abdomen and pelvis. Open midline incision noted. Locules of free air throughout the upper abdominal mesentery/omentum with stranding, presumably postoperative. Musculoskeletal: No acute bony abnormality. IMPRESSION: Postoperative changes from partial colectomy. Right lower quadrant ostomy noted. No evidence of bowel obstruction. There is stranding and locules of gas within the upper abdominal mesentery/omentum, presumably postoperative. Moderate free fluid in the abdomen and pelvis. Small bilateral pleural effusions.  Bibasilar atelectasis. Low-density lesion in the caudate lobe compatible with known metastasis. Scattered hypodensities throughout the spleen, nonspecific. Stable left adrenal nodule/adenoma as seen on prior MRI. Electronically Signed   By: Rolm Baptise M.D.   On: 01/05/2019 15:53  Dg Chest Port 1 View  Result Date: 12/31/2018 CLINICAL DATA:  wheezing EXAM: PORTABLE CHEST 1 VIEW COMPARISON:  12/21/2018 FINDINGS: Patient has RIGHT-sided PowerPort, tip to the superior vena cava. Nasogastric  tube has been placed, tip overlying the level of the proximal stomach. The heart is enlarged. There is pulmonary vascular congestion but no alveolar edema. No consolidations. IMPRESSION: Cardiomegaly and vascular congestion. Electronically Signed   By: Nolon Nations M.D.   On: 12/31/2018 13:01    Assessment/Plan #1--history of cancer of the splenic flexure of the colon-with procedures as noted above- chemotherapy is deferred until she is stable continuing to receive PT and OT she reports she has some continued pain more so with movement again she is doing therapy which I suspect is contributing to this she would benefit it appears at this point from continued Vicodin again she has orders to the first part of next week- continue to monitor she says the Vicodin twice daily is helping.  2.  History of atrial fibrillation currently appears rate controlled on Cardizem she is on Eliquis for anticoagulation will monitor.  3.  Anemia this is fairly chronic it appears she has received transfusion and iron injections apparently will update this first laboratory day next week. Also update a metabolic panel  .  [id number]

## 2019-01-18 ENCOUNTER — Encounter: Payer: Self-pay | Admitting: Internal Medicine

## 2019-01-20 ENCOUNTER — Non-Acute Institutional Stay (SKILLED_NURSING_FACILITY): Payer: Medicare HMO | Admitting: Internal Medicine

## 2019-01-20 ENCOUNTER — Encounter: Payer: Self-pay | Admitting: Internal Medicine

## 2019-01-20 ENCOUNTER — Other Ambulatory Visit: Payer: Self-pay | Admitting: Internal Medicine

## 2019-01-20 DIAGNOSIS — I6781 Acute cerebrovascular insufficiency: Secondary | ICD-10-CM | POA: Diagnosis not present

## 2019-01-20 DIAGNOSIS — C185 Malignant neoplasm of splenic flexure: Secondary | ICD-10-CM

## 2019-01-20 DIAGNOSIS — I482 Chronic atrial fibrillation, unspecified: Secondary | ICD-10-CM | POA: Diagnosis not present

## 2019-01-20 LAB — BASIC METABOLIC PANEL
BUN: 23 — AB (ref 4–21)
Creatinine: 0.7 (ref 0.5–1.1)
Glucose: 141
Potassium: 3.7 (ref 3.4–5.3)
Sodium: 145 (ref 137–147)

## 2019-01-20 LAB — CBC AND DIFFERENTIAL
HCT: 31 — AB (ref 36–46)
Hemoglobin: 10.1 — AB (ref 12.0–16.0)
Platelets: 382 (ref 150–399)
WBC: 15.4

## 2019-01-20 LAB — HEPATIC FUNCTION PANEL
ALT: 33 (ref 7–35)
AST: 21 (ref 13–35)

## 2019-01-20 MED ORDER — HYDROCODONE-ACETAMINOPHEN 7.5-325 MG PO TABS: 1.0000 | ORAL_TABLET | Freq: Two times a day (BID) | ORAL | 0 refills | Status: DC | PRN

## 2019-01-20 NOTE — Progress Notes (Signed)
Location:    [facility] Room Number: 215/A Place of Service:  SNF [phone]) Provider:  Edwinna Areola, MD  Patient Care Team: [name], MD as PCP - General (Family Medicine) Thompson Grayer, MD as PCP - Cardiology (Cardiology) Thompson Grayer, MD as PCP - Electrophysiology (Cardiology) Ronnette Juniper, MD as Consulting Physician (Gastroenterology) Truitt Merle, MD as Consulting Physician (Medical Oncology) Newt Minion, MD as Consulting Physician (Orthopedic Surgery) Leighton Ruff, MD as Consulting Physician (Colon and Rectal Surgery)  Extended Emergency Contact Information Primary Emergency Contact: [place] Phone: [phone] Relation: Son  Code Status:  Full Code Goals of care: Advanced Directive information Advanced Directives 01/20/2019  Does Patient Have a Medical Advance Directive? Yes  Type of Advance Directive (No Data)  Does patient want to make changes to medical advance directive? No - Patient declined  Would patient like information on creating a medical advance directive? No - Patient declined     Chief Complaint  Patient presents with  . Acute Visit    F/U Pain    HPI:  Pt is a 73 y.o. female seen today for an acute visit for follow-up of abdominal pain-also follow-up of labs that were ordered.  Patient is here for rehab after hospitalization for abdominal pain secondary to an obstructing colonic mass.  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  Course was complicated by hypotension her calcium channel blocker was held.  She also was treated for A. fib with metoprolol and digoxin.  She also had vascular congestion which  responded to IV Lasix.  She is on Cardizem as well as Eliquis with A. fib which appears to be rate controlled.  She also has a history of anemia did receive iron transfusions weekly apparently and has had transfusions in the past hemoglobin today shows improvement up to 10.1 previously had been 8.9 on lab done in late February.  She complained of some abdominal pain when I saw her Friday afternoon pain was more prominent when she moved is thought this may be caused by her doing more therapy.  Today she says the pain actually appears to be improved she is lying in bed comfortably.  We did order labs which has shown relative stability of her metabolic panel creatinine of 0.7 sodium 145 potassium 3.7 liver function tests showed an albumin of 2.2 alkaline phosphatase was elevated at 664 otherwise within normal limits.  In regards to CBC her white count is up somewhat at 15,400.   had run around 10,000 on lab in late February-.  She is not complaining of any fever chills increased cough or congestion or dysuria.  She actually appears to be feeling better in better spirits than when I saw her on Friday.  She does have an abdominal surgical wound which is followed by wound care this does not appear to have any overt signs of infection this has been followed by the wound treatment nurse.      Past Medical History:  Diagnosis Date  . Acute diastolic heart failure ([place]) 12/22/2018  . Arthritis   . Atrial fibrillation, chronic 12/22/2018  . Cancer of left colon ([place]) 10/30/2018  . Cancer of sigmoid colon  12/27/2018  . Diabetes mellitus without complication ([place])   .  Hypertension   . Obesity (BMI 30-39.9) 12/27/2018   Past Surgical History:  Procedure Laterality Date  . COLONOSCOPY  10/2018   Dr [name].  Large cancer at splenic flexure,  Bulky sigmoid colon mass, Numerous polyps  . IR IMAGING GUIDED PORT INSERTION  11/20/2018  . LAPAROTOMY N/A 12/27/2018   Procedure: LEFT SIGMOID COLECTOMY WITH  HARTMANN POUCH AND END COLOSTOMY;  Surgeon: [name], MD;  Location: [facility];  Service: General;  Laterality: N/A;    Allergies  Allergen Reactions  . Metformin And Related Nausea And Vomiting  . Prednisone Nausea And Vomiting    Outpatient Encounter Medications as of 01/20/2019  Medication Sig  . apixaban (ELIQUIS) 5 MG TABS tablet TAKE 1 TABLET (5 MG TOTAL) BY MOUTH 2 (TWO) TIMES DAILY.  . bisacodyl (DULCOLAX) 10 MG suppository Constipation ( 2 of 4) If not relieved by MOM give 10 mg Bisacodyl suppository rectally x 1 dose in 24 hours as needed ( (Do not use constipation standing orders for residents with renal failure/CFR less than 30. Contact MD for orders)  . diltiazem (CARDIZEM CD) 120 MG 24 hr capsule Take 1 capsule (120 mg total) by mouth daily.  . furosemide (LASIX) 20 MG tablet Lasix 20 mg po daily PRN for weight of greater than 3 lbs in a day or 5 lbs in a week  . HYDROcodone-acetaminophen (NORCO) 7.5-325 MG tablet Take 1 tablet by mouth 2 (two) times daily as needed for up to 7 days (pain).  . insulin glargine (LANTUS) 100 UNIT/ML injection Inject 0.1 mLs (10 Units total) into the skin at bedtime.  . magnesium hydroxide (MILK OF MAGNESIA) 400 MG/5ML suspension Constipation(1 of 4) If no BM in 3 days give 30 cc Milk of Magnesium  p.o. x 1 dose in 24 hours as needed ( Do not use standing constipation orders for residents with renal failure CFR less than 30. Contact MD for orders)  . NON FORMULARY NSA Med Pass 120 ml po daily  . ondansetron (ZOFRAN) 8 MG tablet Take 8 mg by mouth every 8 (eight) hours as needed for nausea.  Marland Kitchen oxybutynin (DITROPAN-XL) 10 MG 24 hr tablet Take 10 mg by mouth daily.  . sertraline (ZOLOFT) 100 MG tablet Take 100 mg by mouth daily.   . simvastatin (ZOCOR) 20 MG tablet Take 20 mg by mouth at bedtime.  . Sodium Phosphates (RA SALINE ENEMA RE) Constipation ( 3 of 4 ) If not relieved by Bisacodyl suppository give disposable Saline Enema rectally x 1 dose  /24 hrs as needed   (Do not use constipation standing orders for residents with renal failure/CFR less than 30. Contact MD for orders)    Constipation (4 of 4): Contact MD as needed if no results from enema (Nursing Measure)   No facility-administered encounter medications on file as of 01/20/2019.     Review of Systems   In general she is not complaining of any fever chills.  Skin does not complain of rashes itching or diaphoresis surgical wound.  Head ears eyes nose mouth and throat is not complaining of visual changes or sore throat.  Respiratory does not complain of increased shortness of breath or cough.  Cardiac does not complain of chest pain does not appear to have significant lower extremity edema.  GI says her abdominal discomfort is improved today she does have a colostomy bag which appears to be producing an adequate amount of stool. She would like something for breakthrough pain in between her Vicodin doses  which she receives twice a day  GU does not complain of dysuria.  Musculoskeletal does not complain of joint pain continues to have weakness.  Neurologic positive for weakness does not complain of dizziness headache or syncope.  Psych does not complain of being overtly depressed or anxious appears to be in better spirits feeling better today than when I saw her on Friday-she does complain of insomnia  Immunization History  Administered Date(s) Administered  . Influenza-Unspecified 07/24/2018   Pertinent  Health Maintenance Due  Topic Date Due  . MAMMOGRAM  11/07/1996  . FOOT EXAM  02/13/2019 (Originally 11/07/1956)  . OPHTHALMOLOGY EXAM  02/13/2019 (Originally 11/07/1956)  . URINE MICROALBUMIN  02/13/2019 (Originally 11/07/1956)  . DEXA SCAN  02/13/2019 (Originally 11/08/2011)  . PNA vac Low Risk Adult (1 of 2 - PCV13) 02/13/2019 (Originally 11/08/2011)  . HEMOGLOBIN A1C  02/07/2019  . COLONOSCOPY  10/25/2028  . INFLUENZA VACCINE  Completed   No flowsheet  data found. Functional Status Survey:    Vitals:   01/20/19 1552  BP: 123/82  Pulse: 78  Resp: 19  Temp: 97.6 F (36.4 C)  TempSrc: Oral  Weight: 172 lb 12.8 oz (78.4 kg)  Height: 5\' 2"  (1.575 m)   Body mass index is 31.61 kg/m. Physical Exam   In general this is a pleasant elderly female in no distress lying comfortably in bed.  Her skin is warm and dry.  Eyes visual acuity appears to be intact sclera and conjunctive are clear.  Oropharynx is clear mucous membranes moist.  Chest is clear to auscultation there is no labored breathing.  Heart is irregular irregular rate and rhythm without murmur gallop or rub she does not really have significant lower extremity edema.  Abdomen is somewhat obese it is soft does not appear to be acutely tender-colostomy bag is draining a moderate amount of stool.  Surgical site does have beefy red appearance- could not appreciate any odor continues to have some drainage from the site- area does not have any surrounding erythema or acute tenderness or warmth.  GU could not appreciate suprapubic tenderness.  Musculoskeletal does have generalized weakness but does move all extremities x4.  Neurologic as noted above could not appreciate lateralizing findings her speech is clear.  Psych she is alert and oriented pleasant and appropriate  Labs reviewed: Recent Labs    01/01/19 0447 01/02/19 0518 01/03/19 0259  01/05/19 0356 01/06/19 0352 01/07/19 0419 01/08/19 0330 01/09/19 0452  NA 146* 143 139   < > 142 141 142  --   --   K 4.1 4.0 4.5   < > 4.7 4.3 4.1  --   --   CL 105 101 96*   < > 99 100 102  --   --   CO2 33* 34* 34*   < > 32 35* 34*  --   --   GLUCOSE 214* 181* 290*   < > 103* 109* 123*  --   --   BUN 31* 25* 27*   < > 37* 34* 33*  --   --   CREATININE 0.68 0.61 0.74   < > 0.96 0.80 0.68  --   --   CALCIUM 7.8* 7.4* 7.4*   < > 7.7* 7.6* 7.8*  --   --   MG 2.0 1.4* 1.7  --  1.8 1.7 1.7 1.7 1.7  PHOS 2.3* 3.1 3.4  --   --    --   --   --   --    < > =  values in this interval not displayed.   Recent Labs    12/28/18 0323 12/30/18 0319 12/31/18 0337 01/02/19 0518  AST 38 21 20  --   ALT 25 23 20   --   ALKPHOS 62 87 84  --   BILITOT 0.4 0.9 0.6  --   PROT 4.5* 4.4* 4.7*  --   ALBUMIN 1.9* 1.5* 1.5* 1.7*   Recent Labs    12/28/18 0323 12/29/18 0400 12/30/18 0319  01/08/19 0330 01/09/19 0452 01/10/19 0357  WBC 7.1 5.3 6.8   < > 10.9* 9.0 10.2  NEUTROABS 4.3 3.3 4.6  --   --   --   --   HGB 10.3* 9.5* 9.3*   < > 8.6* 8.7* 8.9*  HCT 33.7* 31.1* 30.0*   < > 28.7* 29.7* 29.9*  MCV 96.0 96.0 96.8   < > 98.3 99.0 97.7  PLT 250 141* 128*   < > 324 375 412*   < > = values in this interval not displayed.   Lab Results  Component Value Date   TSH 1.894 01/03/2019   Lab Results  Component Value Date   HGBA1C 5.6 08/09/2018   Lab Results  Component Value Date   CHOL 125 08/09/2018   HDL 30 (L) 08/09/2018   LDLCALC 68 08/09/2018   TRIG 202 (H) 12/31/2018   CHOLHDL 4.2 08/09/2018    Significant Diagnostic Results in last 30 days:  Ct Abdomen Pelvis Wo Contrast  Result Date: 12/26/2018 CLINICAL DATA:  Abdominal pain, known history of colon carcinoma with liver metastatic disease EXAM: CT ABDOMEN AND PELVIS WITHOUT CONTRAST TECHNIQUE: Multidetector CT imaging of the abdomen and pelvis was performed following the standard protocol without IV contrast. COMPARISON:  MRI from 12/07/2018 and CT from 11/01/2018 FINDINGS: Lower chest: No acute abnormality. Hepatobiliary: Somewhat limited due to lack of IV contrast. Hypodense lesion is again seen in the caudate lobe consistent with the known metastatic disease. Gallbladder is well distended with multiple gallstones within. No biliary ductal dilatation is seen. Density is noted within the distal common bile duct consistent with common bile duct stone similar to that seen on prior MRI. Pancreas: Unremarkable. No pancreatic ductal dilatation or surrounding  inflammatory changes. Spleen: Normal in size without focal abnormality. Adrenals/Urinary Tract: Right adrenal gland is within normal limits. Stable left adrenal lesion is seen shown to represent adenoma on prior MRI. The kidneys are within normal limits bilaterally. No renal calculi or obstructive changes are seen. The bladder is partially decompressed. Stomach/Bowel: Scattered diverticular change of the colon is noted. The transverse and ascending colon are dilated and fluid-filled. There is a caliber change identified at the mid descending colon. This would correspond with the known colonic mass seen on prior colonoscopy. Additionally there is a mass lesion within lumen of the sigmoid colon similar to that seen on prior CT examination and colonoscopy. Small-bowel is significantly distended with fluid and air consistent with the proximal descending colon obstructing lesion. Dilatation extends from the fourth portion of the duodenum to the terminal ileum. Vascular/Lymphatic: Aortic atherosclerosis. No enlarged abdominal or pelvic lymph nodes. Reproductive: Uterus and bilateral adnexa are unremarkable. Other: No abdominal wall hernia or abnormality. No abdominopelvic ascites. Musculoskeletal: Degenerative changes of lumbar spine. IMPRESSION: Changes consistent with an obstructing lesion in the proximal descending colon consistent with the known history of colon carcinoma. The proximal colon as well as the entire small bowel is dilated secondary to the obstructive change. Second intraluminal mass within the sigmoid colon consistent  with prior colonoscopy findings. Stable left adrenal lesion shown to represent adenoma on recent MRI. Hepatic metastatic disease similar to the prior MRI. Cholelithiasis without complicating factors. Electronically Signed   By: Inez Catalina M.D.   On: 12/26/2018 01:43   Dg Chest 2 View  Result Date: 12/21/2018 CLINICAL DATA:  Midline chest pain.  Shortness of breath EXAM: CHEST - 2 VIEW  COMPARISON:  Chest radiograph 11/02/2018 FINDINGS: Right anterior chest wall Port-A-Cath is present with tip projecting over the superior vena cava. Monitoring leads overlie the patient. Cardiomegaly. Pulmonary vascular redistribution. Mild interstitial opacities. No pleural effusion. Thoracic spine degenerative changes. IMPRESSION: Cardiomegaly. Pulmonary vascular redistribution and mild interstitial edema. Electronically Signed   By: Lovey Newcomer M.D.   On: 12/21/2018 21:11   Dg Abd 1 View  Result Date: 12/31/2018 CLINICAL DATA:  73 year old female enteric tube placement. Postoperative day 4 distal large bowel resection due to obstructing tumor. EXAM: ABDOMEN - 1 VIEW COMPARISON:  12/26/2018. FINDINGS: AP supine view at 0120 hours. Enteric tube tip is at the level of the gastric body. The side hole is at the level of the gastric fundus. Resolved gas-filled transverse colon, but continued gas-filled small bowel loops in the lower abdomen and pelvis which are at the upper limits of normal to mildly dilated. New lower abdominal skin staples. Negative lung bases. No acute osseous abnormality identified. IMPRESSION: 1. Enteric tube in the stomach, side hole the level of the fundus. 2. Improved bowel gas pattern since 12/26/2018. Borderline to mildly dilated small bowel loops probably reflect postoperative ileus. Electronically Signed   By: Genevie Ann M.D.   On: 12/31/2018 02:38   Dg Abdomen 1 View  Result Date: 12/26/2018 CLINICAL DATA:  Nasogastric tube placement EXAM: ABDOMEN - 1 VIEW COMPARISON:  Abdominal CT earlier today FINDINGS: Nasogastric tube tip at the distal stomach. Diffuse gaseous bowel distention in this patient with left colonic obstruction by CT. IMPRESSION: 1. Nasogastric tube with tip at the distal stomach. 2. Known descending colonic obstruction. Electronically Signed   By: Monte Fantasia M.D.   On: 12/26/2018 04:18   Ct Abdomen Pelvis W Contrast  Result Date: 01/05/2019 CLINICAL DATA:   Postop infection. Sigmoid colectomy with Hartmann's pouch and colostomy. EXAM: CT ABDOMEN AND PELVIS WITH CONTRAST TECHNIQUE: Multidetector CT imaging of the abdomen and pelvis was performed using the standard protocol following bolus administration of intravenous contrast. CONTRAST:  155mL OMNIPAQUE IOHEXOL 300 MG/ML  SOLN COMPARISON:  12/26/2018 FINDINGS: Lower chest: Small bilateral pleural effusions. Bibasilar atelectasis. Heart is mildly enlarged. Hepatobiliary: Irregular low-density lesion in the caudate lobe measures up to 3.3 cm compatible with the known metastasis. No biliary ductal dilatation. Possible gallstone noted within the gallbladder. Pancreas: No focal abnormality or ductal dilatation. Spleen: Scattered hypodensities in the spleen, nonspecific. Normal size. Adrenals/Urinary Tract: Left adrenal lesion again noted measuring 3 cm. This is stable. Right adrenal gland is unremarkable. No focal renal abnormality or hydronephrosis. Urinary bladder grossly unremarkable. Stomach/Bowel: Changes of partial colectomy right lower quadrant ostomy noted. Stomach is decompressed. No evidence of bowel obstruction. Vascular/Lymphatic: Aortic atherosclerosis. No enlarged abdominal or pelvic lymph nodes. Reproductive: Uterus and adnexa unremarkable.  No mass. Other: Moderate free fluid in the abdomen and pelvis. Open midline incision noted. Locules of free air throughout the upper abdominal mesentery/omentum with stranding, presumably postoperative. Musculoskeletal: No acute bony abnormality. IMPRESSION: Postoperative changes from partial colectomy. Right lower quadrant ostomy noted. No evidence of bowel obstruction. There is stranding and locules of gas within the  upper abdominal mesentery/omentum, presumably postoperative. Moderate free fluid in the abdomen and pelvis. Small bilateral pleural effusions.  Bibasilar atelectasis. Low-density lesion in the caudate lobe compatible with known metastasis. Scattered  hypodensities throughout the spleen, nonspecific. Stable left adrenal nodule/adenoma as seen on prior MRI. Electronically Signed   By: Rolm Baptise M.D.   On: 01/05/2019 15:53   Dg Chest Port 1 View  Result Date: 12/31/2018 CLINICAL DATA:  wheezing EXAM: PORTABLE CHEST 1 VIEW COMPARISON:  12/21/2018 FINDINGS: Patient has RIGHT-sided PowerPort, tip to the superior vena cava. Nasogastric tube has been placed, tip overlying the level of the proximal stomach. The heart is enlarged. There is pulmonary vascular congestion but no alveolar edema. No consolidations. IMPRESSION: Cardiomegaly and vascular congestion. Electronically Signed   By: Nolon Nations M.D.   On: 12/31/2018 13:01    Assessment/Plan  #1- history of abdominal discomfort--with a history of sigmoid colon cancer status post- procedure with placement of colostomy -- this appears to have improved she still complains of pain and would like something for breakthrough discomfort will write an order for PRN Tylenol 650 mg every 4 hours as needed- no more than 3 g Tylenol total from all sources in 24-hour..  If this is not effective consider creasing frequency of Vicodin but would like to avoid increasing narcotics if possible.   does receive Vicodin 7.5-325 mg every 12 hours as needed.  2.  History of leukocytosis-white count has risen somewhat-she does not have fever chills-abdominal tenderness actually appears improved she does not complain of cough congestion dysuria.  Blood sugars today appear to be stable in the mid 100s so far-she is on Lantus 10 units daily history of type 2 diabetes  We are trying to get her into see her surgeon as soon as possible for follow-up and have written an order for this-will order follow-up labs CBC with differential on Thursday, March 12 as well to keep an eye on this and monitor vital signs pulse ox every shift.  3.  Insomnia will write an order for melatonin 3 mg nightly and monitor.  4 atrial  fibrillation this continues to be rate controlled on the Cardizem she is on Eliquis for anticoagulation   [id number]

## 2019-01-21 ENCOUNTER — Encounter: Payer: Self-pay | Admitting: Internal Medicine

## 2019-01-23 ENCOUNTER — Non-Acute Institutional Stay (SKILLED_NURSING_FACILITY): Payer: Medicare HMO | Admitting: Internal Medicine

## 2019-01-23 ENCOUNTER — Encounter: Payer: Self-pay | Admitting: Internal Medicine

## 2019-01-23 DIAGNOSIS — D72829 Elevated white blood cell count, unspecified: Secondary | ICD-10-CM | POA: Diagnosis not present

## 2019-01-23 DIAGNOSIS — C187 Malignant neoplasm of sigmoid colon: Secondary | ICD-10-CM

## 2019-01-23 LAB — COMPLETE METABOLIC PANEL WITH GFR
Albumin: 2.2
Albumin: 2.4
Calcium: 7.9
Calcium: 8.5
Carbon Dioxide, Total: 27
Carbon Dioxide, Total: 28
Chloride: 105
Chloride: 105
Globulin: 2.6
Globulin: 2.9
Total Protein: 4.8 — AB (ref 6.4–8.2)
Total Protein: 5.3 — AB (ref 6.4–8.2)

## 2019-01-23 LAB — BASIC METABOLIC PANEL
BUN: 22 — AB (ref 4–21)
CREATININE: 0.5 (ref 0.5–1.1)
Glucose: 99
POTASSIUM: 4 (ref 3.4–5.3)
Sodium: 145 (ref 137–147)

## 2019-01-23 LAB — CBC AND DIFFERENTIAL
HCT: 31 — AB (ref 36–46)
Hemoglobin: 10 — AB (ref 12.0–16.0)
Platelets: 364 (ref 150–399)
WBC: 14.7

## 2019-01-23 LAB — HEPATIC FUNCTION PANEL
ALT: 20 (ref 7–35)
AST: 12 — AB (ref 13–35)

## 2019-01-23 NOTE — Progress Notes (Signed)
Location:    [facility] Room Number: 215/A Place of Service:  SNF [phone]) Provider:  Edwinna Areola, MD  Patient Care Team: [name], MD as PCP - General (Family Medicine) Thompson Grayer, MD as PCP - Cardiology (Cardiology) Thompson Grayer, MD as PCP - Electrophysiology (Cardiology) Ronnette Juniper, MD as Consulting Physician (Gastroenterology) Truitt Merle, MD as Consulting Physician (Medical Oncology) Newt Minion, MD as Consulting Physician (Orthopedic Surgery) Leighton Ruff, MD as Consulting Physician (Colon and Rectal Surgery)  Extended Emergency Contact Information Primary Emergency Contact: [place] Phone: [phone] Relation: Son  Code Status:  Full Code Goals of care: Advanced Directive information Advanced Directives 01/23/2019  Does Patient Have a Medical Advance Directive? Yes  Type of Advance Directive (No Data)  Does patient want to make changes to medical advance directive? No - Patient declined  Would patient like information on creating a medical advance directive? No - Patient declined     Chief Complaint  Patient presents with   Acute Visit    [facility]    HPI:  Pt is a 73 y.o. female seen today for an acute visit for  Follow-up of labs including follow-up of leukocytosis.  Patient is here in skilled nursing for rehab after hospitalization for abdominal pain secondary to an obstructing colonic mass.  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  Course was complicated by hypotension her calcium channel blocker was held.  She also was treated for A. fib with metoprolol and digoxin.  She also had vascular congestion  which responded to IV Lasix.  She is on Cardizem as well as Eliquis with A. fib which appears to be rate controlled.  She also has a history of anemia did receive iron transfusions weekly apparently and has had transfusions in the past hemoglobin today shows  Stability with a hemoglobin at 10.0 and in late February hemoglobin was 8.9.  We have also been following her white count which was elevated earlier this week at 15,400-it is now 14,700.  She continues to deny any fever chills increased cough from baseline or dysuria-.  She has been afebrile.  She does have an abdominal surgical wound which is followed by wound care and does not appear to be overtly infected she continues to have some abdominal tenderness this is more so with movement.  She does complain of somewhat chronic pain- she does complain of back pain as well as at times some arm discomfort- she does have an order for Vicodin 7.5-325 mg twice daily routine.  Dr. Linna Darner and I did discuss this with her at bedside- and we will increase her melatonin at night to 6 mg with hopes that this will help somewhat to help her relax-also considering possibly PRN tramadol but this will have to be spaced apart from her Zoloft which she receives for depression.      .   Past Medical History:  Diagnosis Date   Acute diastolic heart failure ([place]) 12/22/2018   Arthritis    Atrial fibrillation, chronic 12/22/2018   Cancer of left colon ([place]) 10/30/2018   Cancer of sigmoid colon  12/27/2018   Diabetes mellitus without complication ([place])    Hypertension    Obesity (BMI 30-39.9) 12/27/2018   Past Surgical  History:  Procedure Laterality Date   COLONOSCOPY  10/2018   Dr [name].  Large cancer at splenic flexure,  Bulky sigmoid colon mass, Numerous polyps   IR IMAGING GUIDED PORT INSERTION  11/20/2018   LAPAROTOMY N/A 12/27/2018   Procedure: LEFT SIGMOID COLECTOMY WITH HARTMANN POUCH AND END COLOSTOMY;  Surgeon: [name], MD;   Location: [facility] ORS;  Service: General;  Laterality: N/A;    Allergies  Allergen Reactions   Metformin And Related Nausea And Vomiting   Prednisone Nausea And Vomiting    Outpatient Encounter Medications as of 01/23/2019  Medication Sig   acetaminophen (TYLENOL) 325 MG tablet Take 650 mg by mouth every 4 (four) hours as needed.   apixaban (ELIQUIS) 5 MG TABS tablet TAKE 1 TABLET (5 MG TOTAL) BY MOUTH 2 (TWO) TIMES DAILY.   bisacodyl (DULCOLAX) 10 MG suppository Constipation ( 2 of 4) If not relieved by MOM give 10 mg Bisacodyl suppository rectally x 1 dose in 24 hours as needed ( (Do not use constipation standing orders for residents with renal failure/CFR less than 30. Contact MD for orders)   diltiazem (CARDIZEM CD) 120 MG 24 hr capsule Take 1 capsule (120 mg total) by mouth daily.   furosemide (LASIX) 20 MG tablet Lasix 20 mg po daily PRN for weight of greater than 3 lbs in a day or 5 lbs in a week   HYDROcodone-acetaminophen (NORCO) 7.5-325 MG tablet Take 1 tablet by mouth 2 (two) times daily as needed for moderate pain.   insulin glargine (LANTUS) 100 UNIT/ML injection Inject 0.1 mLs (10 Units total) into the skin at bedtime.   magnesium hydroxide (MILK OF MAGNESIA) 400 MG/5ML suspension Constipation(1 of 4) If no BM in 3 days give 30 cc Milk of Magnesium  p.o. x 1 dose in 24 hours as needed ( Do not use standing constipation orders for residents with renal failure CFR less than 30. Contact MD for orders)   Melatonin 3 MG TABS Take 3 mg by mouth at bedtime.   NON FORMULARY NSA Med Pass 120 ml po daily   ondansetron (ZOFRAN) 8 MG tablet Take 8 mg by mouth every 8 (eight) hours as needed for nausea.   oxybutynin (DITROPAN-XL) 10 MG 24 hr tablet Take 10 mg by mouth daily.   sertraline (ZOLOFT) 100 MG tablet Take 100 mg by mouth daily.    simvastatin (ZOCOR) 20 MG tablet Take 20 mg by mouth at bedtime.   Sodium Phosphates (RA SALINE ENEMA RE) Constipation ( 3 of 4 ) If not  relieved by Bisacodyl suppository give disposable Saline Enema rectally x 1 dose /24 hrs as needed   (Do not use constipation standing orders for residents with renal failure/CFR less than 30. Contact MD for orders)    Constipation (4 of 4): Contact MD as needed if no results from enema (Nursing Measure)   [DISCONTINUED] HYDROcodone-acetaminophen (NORCO) 7.5-325 MG tablet Take 1 tablet by mouth 2 (two) times daily as needed for up to 7 days (pain). (Patient taking differently: Take 1 tablet by mouth 2 (two) times daily as needed (pain). Stop  date 01/27/2019)   No facility-administered encounter medications on file as of 01/23/2019.     Review of Systems   In general she is not complaining of any fever chills.  Skin does not complain of rashes itching or diaphoresis.  Head ears eyes nose mouth and throat is not complain of visual changes or sore throat.  Respiratory denies increased cough from baseline shortness  of breath or significant congestion.  Cardiac is not complaining of chest pain has trace lower extremity edema.  GI does have an extensive history here with surgery and colonoscopy- says she does have some pain more with movement but does not describe this as an acute pain.  GU denies dysuria.  Musculoskeletal does complain of somewhat diffuse joint pain and back pain which is fairly chronic.  Neurologic does have some weakness does not complain of dizziness headache or syncope.  And psych does have a history of depression continues on Zoloft at times will have somewhat of a flat affect.    Immunization History  Administered Date(s) Administered   Influenza-Unspecified 07/24/2018   Pertinent  Health Maintenance Due  Topic Date Due   MAMMOGRAM  11/07/1996   FOOT EXAM  02/13/2019 (Originally 11/07/1956)   OPHTHALMOLOGY EXAM  02/13/2019 (Originally 11/07/1956)   URINE MICROALBUMIN  02/13/2019 (Originally 11/07/1956)   DEXA SCAN  02/13/2019 (Originally 11/08/2011)    PNA vac Low Risk Adult (1 of 2 - PCV13) 02/13/2019 (Originally 11/08/2011)   HEMOGLOBIN A1C  02/07/2019   COLONOSCOPY  10/25/2028   INFLUENZA VACCINE  Completed   No flowsheet data found. Functional Status Survey:    Temperature is 97.1 pulse 74 respirations 18 blood pressure 120/60 Physical Exam   In general this is a pleasant elderly female in no distress initially was seen in her wheelchair later with Dr. Linna Darner- she was assessed while lying in bed.  Her skin is warm and dry surgical wound mid abdomen does have beefy red margins I do not note any surrounding erythema or significant tenderness that would be concerning for infection there is a small amount of drainage but no odor.  Eyes visual acuity appears to be intact sclera and conjunctive are clear.  Oropharynx is clear mucous membranes moist.  Chest is clear to auscultation there is no labored breathing.  Heart is irregular irregular rate and rhythm without murmur gallop or rub she has trace lower extremity edema.  Abdomen is soft does not appear to be acutely tender somewhat mildly tender more lower abdominal area she says this increases with movement- she does have a colostomy bag in place- abdominal wound as noted above.  Musculoskeletal does move all extremities x4 with some lower extremity weakness.  Neurologic as noted above but could not appreciate lateralizing findings her speech is clear.  Psych she is alert and oriented pleasant and appropriate  Labs reviewed:   January 23, 2019.  Sodium 145 potassium 4 BUN 22 creatinine 0.51.  Albumin 2.4 alkaline phosphatase 453 otherwise liver function test within normal limits.  WBC 14.7 hemoglobin 10.0 platelets 364.   Recent Labs    01/01/19 0447 01/02/19 0518 01/03/19 0259  01/05/19 0356 01/06/19 0352 01/07/19 0419 01/08/19 0330 01/09/19 0452  NA 146* 143 139   < > 142 141 142  --   --   K 4.1 4.0 4.5   < > 4.7 4.3 4.1  --   --   CL 105 101 96*   <  > 99 100 102  --   --   CO2 33* 34* 34*   < > 32 35* 34*  --   --   GLUCOSE 214* 181* 290*   < > 103* 109* 123*  --   --   BUN 31* 25* 27*   < > 37* 34* 33*  --   --   CREATININE 0.68 0.61 0.74   < > 0.96 0.80 0.68  --   --  CALCIUM 7.8* 7.4* 7.4*   < > 7.7* 7.6* 7.8*  --   --   MG 2.0 1.4* 1.7  --  1.8 1.7 1.7 1.7 1.7  PHOS 2.3* 3.1 3.4  --   --   --   --   --   --    < > = values in this interval not displayed.   Recent Labs    12/28/18 0323 12/30/18 0319 12/31/18 0337 01/02/19 0518  AST 38 21 20  --   ALT 25 23 20   --   ALKPHOS 62 87 84  --   BILITOT 0.4 0.9 0.6  --   PROT 4.5* 4.4* 4.7*  --   ALBUMIN 1.9* 1.5* 1.5* 1.7*   Recent Labs    12/28/18 0323 12/29/18 0400 12/30/18 0319  01/08/19 0330 01/09/19 0452 01/10/19 0357  WBC 7.1 5.3 6.8   < > 10.9* 9.0 10.2  NEUTROABS 4.3 3.3 4.6  --   --   --   --   HGB 10.3* 9.5* 9.3*   < > 8.6* 8.7* 8.9*  HCT 33.7* 31.1* 30.0*   < > 28.7* 29.7* 29.9*  MCV 96.0 96.0 96.8   < > 98.3 99.0 97.7  PLT 250 141* 128*   < > 324 375 412*   < > = values in this interval not displayed.   Lab Results  Component Value Date   TSH 1.894 01/03/2019   Lab Results  Component Value Date   HGBA1C 5.6 08/09/2018   Lab Results  Component Value Date   CHOL 125 08/09/2018   HDL 30 (L) 08/09/2018   LDLCALC 68 08/09/2018   TRIG 202 (H) 12/31/2018   CHOLHDL 4.2 08/09/2018    Significant Diagnostic Results in last 30 days:  Ct Abdomen Pelvis Wo Contrast  Result Date: 12/26/2018 CLINICAL DATA:  Abdominal pain, known history of colon carcinoma with liver metastatic disease EXAM: CT ABDOMEN AND PELVIS WITHOUT CONTRAST TECHNIQUE: Multidetector CT imaging of the abdomen and pelvis was performed following the standard protocol without IV contrast. COMPARISON:  MRI from 12/07/2018 and CT from 11/01/2018 FINDINGS: Lower chest: No acute abnormality. Hepatobiliary: Somewhat limited due to lack of IV contrast. Hypodense lesion is again seen in the caudate  lobe consistent with the known metastatic disease. Gallbladder is well distended with multiple gallstones within. No biliary ductal dilatation is seen. Density is noted within the distal common bile duct consistent with common bile duct stone similar to that seen on prior MRI. Pancreas: Unremarkable. No pancreatic ductal dilatation or surrounding inflammatory changes. Spleen: Normal in size without focal abnormality. Adrenals/Urinary Tract: Right adrenal gland is within normal limits. Stable left adrenal lesion is seen shown to represent adenoma on prior MRI. The kidneys are within normal limits bilaterally. No renal calculi or obstructive changes are seen. The bladder is partially decompressed. Stomach/Bowel: Scattered diverticular change of the colon is noted. The transverse and ascending colon are dilated and fluid-filled. There is a caliber change identified at the mid descending colon. This would correspond with the known colonic mass seen on prior colonoscopy. Additionally there is a mass lesion within lumen of the sigmoid colon similar to that seen on prior CT examination and colonoscopy. Small-bowel is significantly distended with fluid and air consistent with the proximal descending colon obstructing lesion. Dilatation extends from the fourth portion of the duodenum to the terminal ileum. Vascular/Lymphatic: Aortic atherosclerosis. No enlarged abdominal or pelvic lymph nodes. Reproductive: Uterus and bilateral adnexa are unremarkable. Other: No abdominal  wall hernia or abnormality. No abdominopelvic ascites. Musculoskeletal: Degenerative changes of lumbar spine. IMPRESSION: Changes consistent with an obstructing lesion in the proximal descending colon consistent with the known history of colon carcinoma. The proximal colon as well as the entire small bowel is dilated secondary to the obstructive change. Second intraluminal mass within the sigmoid colon consistent with prior colonoscopy findings. Stable left  adrenal lesion shown to represent adenoma on recent MRI. Hepatic metastatic disease similar to the prior MRI. Cholelithiasis without complicating factors. Electronically Signed   By: Inez Catalina M.D.   On: 12/26/2018 01:43   Dg Abd 1 View  Result Date: 12/31/2018 CLINICAL DATA:  73 year old female enteric tube placement. Postoperative day 4 distal large bowel resection due to obstructing tumor. EXAM: ABDOMEN - 1 VIEW COMPARISON:  12/26/2018. FINDINGS: AP supine view at 0120 hours. Enteric tube tip is at the level of the gastric body. The side hole is at the level of the gastric fundus. Resolved gas-filled transverse colon, but continued gas-filled small bowel loops in the lower abdomen and pelvis which are at the upper limits of normal to mildly dilated. New lower abdominal skin staples. Negative lung bases. No acute osseous abnormality identified. IMPRESSION: 1. Enteric tube in the stomach, side hole the level of the fundus. 2. Improved bowel gas pattern since 12/26/2018. Borderline to mildly dilated small bowel loops probably reflect postoperative ileus. Electronically Signed   By: Genevie Ann M.D.   On: 12/31/2018 02:38   Dg Abdomen 1 View  Result Date: 12/26/2018 CLINICAL DATA:  Nasogastric tube placement EXAM: ABDOMEN - 1 VIEW COMPARISON:  Abdominal CT earlier today FINDINGS: Nasogastric tube tip at the distal stomach. Diffuse gaseous bowel distention in this patient with left colonic obstruction by CT. IMPRESSION: 1. Nasogastric tube with tip at the distal stomach. 2. Known descending colonic obstruction. Electronically Signed   By: Monte Fantasia M.D.   On: 12/26/2018 04:18   Ct Abdomen Pelvis W Contrast  Result Date: 01/05/2019 CLINICAL DATA:  Postop infection. Sigmoid colectomy with Hartmann's pouch and colostomy. EXAM: CT ABDOMEN AND PELVIS WITH CONTRAST TECHNIQUE: Multidetector CT imaging of the abdomen and pelvis was performed using the standard protocol following bolus administration of  intravenous contrast. CONTRAST:  137mL OMNIPAQUE IOHEXOL 300 MG/ML  SOLN COMPARISON:  12/26/2018 FINDINGS: Lower chest: Small bilateral pleural effusions. Bibasilar atelectasis. Heart is mildly enlarged. Hepatobiliary: Irregular low-density lesion in the caudate lobe measures up to 3.3 cm compatible with the known metastasis. No biliary ductal dilatation. Possible gallstone noted within the gallbladder. Pancreas: No focal abnormality or ductal dilatation. Spleen: Scattered hypodensities in the spleen, nonspecific. Normal size. Adrenals/Urinary Tract: Left adrenal lesion again noted measuring 3 cm. This is stable. Right adrenal gland is unremarkable. No focal renal abnormality or hydronephrosis. Urinary bladder grossly unremarkable. Stomach/Bowel: Changes of partial colectomy right lower quadrant ostomy noted. Stomach is decompressed. No evidence of bowel obstruction. Vascular/Lymphatic: Aortic atherosclerosis. No enlarged abdominal or pelvic lymph nodes. Reproductive: Uterus and adnexa unremarkable.  No mass. Other: Moderate free fluid in the abdomen and pelvis. Open midline incision noted. Locules of free air throughout the upper abdominal mesentery/omentum with stranding, presumably postoperative. Musculoskeletal: No acute bony abnormality. IMPRESSION: Postoperative changes from partial colectomy. Right lower quadrant ostomy noted. No evidence of bowel obstruction. There is stranding and locules of gas within the upper abdominal mesentery/omentum, presumably postoperative. Moderate free fluid in the abdomen and pelvis. Small bilateral pleural effusions.  Bibasilar atelectasis. Low-density lesion in the caudate lobe compatible with known metastasis.  Scattered hypodensities throughout the spleen, nonspecific. Stable left adrenal nodule/adenoma as seen on prior MRI. Electronically Signed   By: Rolm Baptise M.D.   On: 01/05/2019 15:53   Dg Chest Port 1 View  Result Date: 12/31/2018 CLINICAL DATA:  wheezing EXAM:  PORTABLE CHEST 1 VIEW COMPARISON:  12/21/2018 FINDINGS: Patient has RIGHT-sided PowerPort, tip to the superior vena cava. Nasogastric tube has been placed, tip overlying the level of the proximal stomach. The heart is enlarged. There is pulmonary vascular congestion but no alveolar edema. No consolidations. IMPRESSION: Cardiomegaly and vascular congestion. Electronically Signed   By: Nolon Nations M.D.   On: 12/31/2018 13:01    Assessment/Plan  #1-history of leukocytosis- patient was evaluated by both me and Dr. Linna Darner- continues to deny any fever chills dysuria increased cough congestion or signs of infection-.  At this point will continue to monitor and update CBC early next week with differential- Dr. Linna Darner did discuss in detail that if she feels she has increased cough congestion fever chills to certainly let nursing know expediently.  2.  History of sigmoid colon cancer status post above-stated procedures with placement of colostomy- at times she will complain of some abdominal pain this appears to be more lower abdominal pain associated with movement-at this point will monitor.  She also complains of somewhat more generalized pain is chronic.  She is currently on Vicodin 7.5 325 mg twice daily and does have PRN Tylenol she says the Tylenol is not totally effective- Dr. Linna Darner did discuss options with her and we will increase her melatonin at night hoping this will help her sleep better and possibly feel less pain.  Also considering PRN tramadol once a day but this will have to be spaced apart from her Zoloft secondary to drug interaction concerns  We are also trying to obtain an expedient surgical consult.   #3 albumin of 2.4 will have dietary look at this and will add med Pass twice daily  [phone]

## 2019-01-24 ENCOUNTER — Non-Acute Institutional Stay (SKILLED_NURSING_FACILITY): Payer: Medicare HMO | Admitting: Internal Medicine

## 2019-01-24 ENCOUNTER — Other Ambulatory Visit: Payer: Self-pay | Admitting: Internal Medicine

## 2019-01-24 DIAGNOSIS — F329 Major depressive disorder, single episode, unspecified: Secondary | ICD-10-CM | POA: Diagnosis not present

## 2019-01-24 DIAGNOSIS — R52 Pain, unspecified: Secondary | ICD-10-CM | POA: Diagnosis not present

## 2019-01-24 DIAGNOSIS — K56609 Unspecified intestinal obstruction, unspecified as to partial versus complete obstruction: Secondary | ICD-10-CM | POA: Diagnosis not present

## 2019-01-24 DIAGNOSIS — F32A Depression, unspecified: Secondary | ICD-10-CM

## 2019-01-24 MED ORDER — TRAMADOL HCL 50 MG PO TABS: 50.0000 mg | ORAL_TABLET | Freq: Every day | ORAL | 0 refills | Status: DC | PRN

## 2019-01-25 ENCOUNTER — Encounter: Payer: Self-pay | Admitting: Internal Medicine

## 2019-01-25 NOTE — Progress Notes (Signed)
This is an acute visit.  Level care skilled.  Facility is [facility].  Chief complaint-acute visit follow-up of colonic mass w with laparoscopic resection of sigmoid colon-.  History of present illness.  Patient is a 73 year old female here in skilled nursing for rehab after hospitalization for abdominal pain secondary to an obstructing colonic mass.  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  She also has a history of atrial fibrillation is on Cardizem l and digoxin.  And a history of vascular congestion did receive Lasix in the hospital.  She also has a history of anemia and did receive iron transfusions in the hospital hemoglobin has shown stability was 10.0 on lab done yesterday.  She does have leukocytosis with a white count of 14,700 on lab yesterday which is baseline with what it was on Monday.  She continues to deny any fever or chills dysuria cough or chest congestion.  She continues to complain of some abdominal discomfort again more so with movement which is been a previous complaint.  She does have Vicodin 7.5 mg 325 mg twice daily.  She has Tylenol for breakthrough pain- she was seen by Dr. Linna Darner as well yesterday and will add tramadol 50 mg once a day as needed-hopefully this will help minimize the use of the Vicodin.  .  She also is complaining of depression she says she is has been frustrated by being laid up that she is normally quite ambulatory and this is a change for her and she is somewhat frustrated- she denies any plans to hurt herself but says she just feels down with this change in status she would like to see a psych consult which we will certainly will order she is on Zoloft as well.  Currently she is lying in bed comfortably.  Of note we have contacted her surgeon's office for follow-up-  per discussion with them they will order a CT scan of her abdomen for early next week and surgeon will see her next week and actually will come to the facility since they are in close proximity.  Past Medical History:  Diagnosis Date   Acute diastolic heart failure ([place]) 12/22/2018   Arthritis    Atrial fibrillation, chronic 12/22/2018   Cancer of left colon ([place]) 10/30/2018   Cancer of sigmoid colon  12/27/2018   Diabetes mellitus without complication ([place])    Hypertension    Obesity (BMI 30-39.9) 12/27/2018        Past Surgical History:  Procedure Laterality Date   COLONOSCOPY  10/2018   Dr [name].  Large cancer at splenic flexure,  Bulky sigmoid colon mass, Numerous polyps   IR IMAGING GUIDED PORT INSERTION  11/20/2018   LAPAROTOMY N/A 12/27/2018   Procedure: LEFT SIGMOID COLECTOMY WITH HARTMANN POUCH AND END COLOSTOMY;  Surgeon: [name], MD;  Location: [facility];  Service: General;  Laterality: N/A;        Allergies  Allergen Reactions   Metformin And Related Nausea And Vomiting   Prednisone Nausea And Vomiting      Medications     Medication Sig   acetaminophen (TYLENOL) 325 MG tablet Take 650 mg by mouth every 4 (four) hours as needed.   apixaban (ELIQUIS) 5 MG TABS tablet TAKE 1 TABLET (5 MG TOTAL) BY MOUTH 2 (TWO) TIMES DAILY.   bisacodyl (DULCOLAX) 10 MG suppository Constipation (  2 of 4) If not relieved by MOM give 10 mg Bisacodyl suppository rectally x 1 dose in 24 hours as needed ( (Do not use constipation standing orders for residents with renal failure/CFR less than 30. Contact MD for orders)   diltiazem (CARDIZEM CD) 120 MG 24 hr capsule Take 1 capsule (120 mg total) by mouth daily.   furosemide (LASIX) 20 MG tablet Lasix 20 mg po daily PRN for weight of greater than 3 lbs in a day or 5 lbs in a week   HYDROcodone-acetaminophen (NORCO) 7.5-325 MG tablet Take 1 tablet by mouth 2 (two) times daily as needed for moderate pain.   insulin  glargine (LANTUS) 100 UNIT/ML injection Inject 0.1 mLs (10 Units total) into the skin at bedtime.   magnesium hydroxide (MILK OF MAGNESIA) 400 MG/5ML suspension Constipation(1 of 4) If no BM in 3 days give 30 cc Milk of Magnesium  p.o. x 1 dose in 24 hours as needed ( Do not use standing constipation orders for residents with renal failure CFR less than 30. Contact MD for orders)   Melatonin 3 MG TABS Take 3 mg by mouth at bedtime.   NON FORMULARY NSA Med Pass 120 ml po daily   ondansetron (ZOFRAN) 8 MG tablet Take 8 mg by mouth every 8 (eight) hours as needed for nausea.   oxybutynin (DITROPAN-XL) 10 MG 24 hr tablet Take 10 mg by mouth daily.   sertraline (ZOLOFT) 100 MG tablet Take 100 mg by mouth daily.    simvastatin (ZOCOR) 20 MG tablet Take 20 mg by mouth at bedtime.   Sodium Phosphates (RA SALINE ENEMA RE) Constipation ( 3 of 4 ) If not relieved by Bisacodyl suppository give disposable Saline Enema rectally x 1 dose /24 hrs as needed   (Do not use constipation standing orders for residents with renal failure/CFR less than 30. Contact MD for orders)    Constipation (4 of 4): Contact MD as needed if no results from enema (Nursing Measure)   [DISCONTINUED] HYDROcodone-acetaminophen (NORCO) 7.5-325 MG tablet Take 1 tablet by mouth 2 (two) times daily as needed for up to 7 days (pain). (Patient taking differently: Take 1 tablet by mouth 2 (two) times daily as needed (pain). Stop  date 01/27/2019)   No facility-administered encounter medications on file as of 01/23/2019.    Review of systems.  General she is not complaining of any fever chills.  Skin does not complain of rashes or itching or diaphoresis she does have a abdominal surgical wound which is followed by wound care and thought to be stable.  Head ears eyes nose mouth and throat is not complain of visual changes or sore throat.  Respiratory does not complain of shortness of breath or any increased cough from  baseline.  Cardiac does not complain of chest pain or increased edema.  GI has an extensive history with surgery and a colostomy placement- continues to complain of some pain with movement more so over lower abdomen.  GU is not complaining of any dysuria.  Musculoskeletal does have continued weakness especially lower extremities does complain of somewhat diffuse pain more so of her back she says is chronic.  Neurologic does have weakness does not complain of syncope dizziness or at this time headache at times apparently will complain of headache.  Psych as noted above she is complaining of feeling somewhat down and depressed since he feels rather debilitated.  Cannot get up and walk like she is used to  Physical exam.  She is afebrile pulse is 82 respirations of 18 blood pressure is pending.  In general this is a pleasant elderly female in no distress lying in bed she appears slightly anxious but in no distress.  Her skin is warm and dry surgical wound midabdomen is currently covered per nursing this is stable- with exam yesterday in which there were beefy red margins around the wound without surrounding erythema that would be concerning for infection.  Eyes visual acuity appears to be intact sclera and conjunctive are clear.  Oropharynx is clear mucous membranes moist.  Chest is clear to auscultation there is no labored breathing.  Heart is irregular irregular rate and rhythm without murmur gallop or rub she has trace lower extremity edema.  Abdomen continues to have dressing over her surgical wound also colostomy bag in place which appears baseline there is some tenderness palpation more so the lower abdomen--this appears to be baseline with previous exams.  Musculoskeletal does move all extremities x4 with baseline lower extremity weakness.  Neurologic as noted above could not really appreciate any lateralizing findings her speech continues to be clear.  Psych she is alert and  oriented pleasant and appropriate.  Labs.  January 23, 2019.  Sodium 145 potassium 4 BUN 22 creatinine 0.51.  Albumin was 2.4.  WBC 14.7 hemoglobin 10.0 platelets 364.  Assessment and plan.  1.  History of sigmoid colon cancer status post resection with diagnosis of primary sigmoid cancer with metastasis.  She continues to complain at times of some lower abdominal discomfort- I did offer a chance to go to the ER for expedient evaluation but she deferred- I have spoken with her surgeon's office and Dr. Hassell Done apparently will see her next week in facility will order a CT scan early next week of her abdomen.  At this point the pain appears to be somewhat intermittent and relatively unchanged over the last few days.  2.  History of complaints of depression she is on Zoloft- will order a psych consult as well- she denies any intentional harm herself but says she feels down because she is nonambulatory and feels weak and frustrated with this change in status.  3.  Leukocytosis as noted above this appears to hover around the 14--- 15,000 area this week-she continues to deny any dysuria fever chills cough or congestion-will update labs on Monday, March 16- I emphasized to her again that if she has any of these symptoms to certainly let nursing staff know.  4.  Pain management again will add the tramadol order once a day as needed this will have to be spaced 12 hours away from the Zoloft secondary to possible drug interaction.  Continues on Vicodin 7.5-325 mg twice daily as needed again we are trying to minimize the Vicodin use Dr. Linna Darner did discuss this with her yesterday  [id number]--of note greater than 25 minutes spent assessing patient-discussing her status with nursing staff reviewing her chart and labs-and addressing her concerns at bedside.  And formulating a plan of care-of note greater than 50% of time spent formulating a plan of care with input as noted above

## 2019-01-27 ENCOUNTER — Non-Acute Institutional Stay (SKILLED_NURSING_FACILITY): Payer: Medicare HMO | Admitting: Internal Medicine

## 2019-01-27 ENCOUNTER — Other Ambulatory Visit: Payer: Self-pay | Admitting: Surgery

## 2019-01-27 ENCOUNTER — Other Ambulatory Visit: Payer: Self-pay | Admitting: Internal Medicine

## 2019-01-27 ENCOUNTER — Encounter: Payer: Self-pay | Admitting: Internal Medicine

## 2019-01-27 DIAGNOSIS — C185 Malignant neoplasm of splenic flexure: Secondary | ICD-10-CM

## 2019-01-27 DIAGNOSIS — Z862 Personal history of diseases of the blood and blood-forming organs and certain disorders involving the immune mechanism: Secondary | ICD-10-CM

## 2019-01-27 DIAGNOSIS — I482 Chronic atrial fibrillation, unspecified: Secondary | ICD-10-CM

## 2019-01-27 DIAGNOSIS — C186 Malignant neoplasm of descending colon: Secondary | ICD-10-CM

## 2019-01-27 LAB — COMPLETE METABOLIC PANEL WITH GFR
Albumin: 2.4
Calcium: 8.5
Carbon Dioxide, Total: 27
Chloride: 105
GLOBULIN: 2.9
Total Protein: 5.3 — AB (ref 6.4–8.2)

## 2019-01-27 LAB — CBC AND DIFFERENTIAL
HCT: 29 — AB (ref 36–46)
Hemoglobin: 9.4 — AB (ref 12.0–16.0)
Platelets: 348 (ref 150–399)
WBC: 12.3

## 2019-01-27 LAB — BASIC METABOLIC PANEL
BUN: 18 (ref 4–21)
Creatinine: 0.6 (ref 0.5–1.1)
Glucose: 227
Potassium: 3.8 (ref 3.4–5.3)
Sodium: 144 (ref 137–147)

## 2019-01-27 MED ORDER — HYDROCODONE-ACETAMINOPHEN 7.5-325 MG PO TABS: 1.0000 | ORAL_TABLET | Freq: Two times a day (BID) | ORAL | 0 refills | Status: DC | PRN

## 2019-01-27 NOTE — Progress Notes (Signed)
Location:    [facility] Room Number: 215/A Place of Service:  SNF [phone]) Provider:  Edwinna Areola, MD  Patient Care Team: [name], MD as PCP - General (Family Medicine) Thompson Grayer, MD as PCP - Cardiology (Cardiology) Thompson Grayer, MD as PCP - Electrophysiology (Cardiology) Ronnette Juniper, MD as Consulting Physician (Gastroenterology) Truitt Merle, MD as Consulting Physician (Medical Oncology) Newt Minion, MD as Consulting Physician (Orthopedic Surgery) Leighton Ruff, MD as Consulting Physician (Colon and Rectal Surgery)  Extended Emergency Contact Information Primary Emergency Contact: [place] Phone: [phone] Relation: Son  Code Status:  Full Code Goals of care: Advanced Directive information Advanced Directives 01/27/2019  Does Patient Have a Medical Advance Directive? Yes  Type of Advance Directive (No Data)  Does patient want to make changes to medical advance directive? No - Patient declined  Would patient like information on creating a medical advance directive? No - Patient declined    Complaint-acute visit follow-up of colonic mass status post resection with history of leukocytosis   HPI:  Pt is a 73 y.o. female seen today for an acute visit for  Follow-up of colonic mass with laparoscopic resection of sigmoid colon- she is here for rehab after hospitalization for abdominal pain secondary to an obstructive colonic mass.  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  She also has a history of atrial fibrillation on Cardizem and digoxin.  She also received iron transfusion in the hospital secondary to anemia hemoglobin 10.0 which shows stability on lab done late last week update lab is pending.  She was also noted to  have an elevated white count on lab done a week ago of around 15,000- she denied any dysuria or fever chills cough congestion or diarrhea-  We repeated a white count on Thursday and had come down slightly to 14,700.  She continues to deny any fever chills cough congestion dysuria.  She has complained of some intermittent abdominal pain more so lower abdominal discomfort more with movement-she says this is been relatively unchanged for some time.  I did speak with her surgeon's office late last week and they have ordered a CT scan of her abdomen appears this is been scheduled for March 20.  Currently she is sitting in her wheelchair comfortably continues to have somewhat of a flat affect-we have ordered a psychiatric consult secondary to complaints of depression-she is on Zoloft.  For pain she is on Vicodin 7.5-325 mg twice daily we have also ordered tramadol 50 mg a day as needed this must be separated from her Zoloft dose by 12 hours.    Past Medical History:  Diagnosis Date   Acute diastolic heart failure ([place]) 12/22/2018   Arthritis    Atrial fibrillation, chronic 12/22/2018   Cancer of left colon ([place]) 10/30/2018   Cancer of sigmoid colon  12/27/2018   Diabetes mellitus without complication ([place])    Hypertension    Obesity (BMI 30-39.9) 12/27/2018   Past Surgical History:  Procedure Laterality Date   COLONOSCOPY  10/2018   Dr [name].  Large cancer at splenic flexure,  Bulky sigmoid colon mass, Numerous polyps   IR IMAGING GUIDED PORT INSERTION  11/20/2018   LAPAROTOMY N/A 12/27/2018   Procedure: LEFT SIGMOID COLECTOMY WITH HARTMANN POUCH AND END COLOSTOMY;  Surgeon: Hassell Done,  Rodman Key, MD;  Location: [facility];  Service: General;  Laterality: N/A;    Allergies  Allergen Reactions   Metformin And Related Nausea And Vomiting   Prednisone Nausea And Vomiting    Outpatient Encounter Medications as of 01/27/2019  Medication Sig   acetaminophen (TYLENOL) 325 MG tablet Take 650  mg by mouth every 4 (four) hours as needed.   apixaban (ELIQUIS) 5 MG TABS tablet TAKE 1 TABLET (5 MG TOTAL) BY MOUTH 2 (TWO) TIMES DAILY.   bisacodyl (DULCOLAX) 10 MG suppository Constipation ( 2 of 4) If not relieved by MOM give 10 mg Bisacodyl suppository rectally x 1 dose in 24 hours as needed ( (Do not use constipation standing orders for residents with renal failure/CFR less than 30. Contact MD for orders)   diltiazem (CARDIZEM CD) 120 MG 24 hr capsule Take 1 capsule (120 mg total) by mouth daily.   furosemide (LASIX) 20 MG tablet Lasix 20 mg po daily PRN for weight of greater than 3 lbs in a day or 5 lbs in a week   HYDROcodone-acetaminophen (NORCO) 7.5-325 MG tablet Take 1 tablet by mouth 2 (two) times daily as needed for moderate pain.   insulin glargine (LANTUS) 100 UNIT/ML injection Inject 0.1 mLs (10 Units total) into the skin at bedtime.   magnesium hydroxide (MILK OF MAGNESIA) 400 MG/5ML suspension Constipation(1 of 4) If no BM in 3 days give 30 cc Milk of Magnesium  p.o. x 1 dose in 24 hours as needed ( Do not use standing constipation orders for residents with renal failure CFR less than 30. Contact MD for orders)   Melatonin 3 MG TABS Take 3 mg by mouth at bedtime.   NON FORMULARY NSA Med Pass 120 ml po daily   ondansetron (ZOFRAN) 8 MG tablet Take 8 mg by mouth every 8 (eight) hours as needed for nausea.   oxybutynin (DITROPAN-XL) 10 MG 24 hr tablet Take 10 mg by mouth daily.   sertraline (ZOLOFT) 100 MG tablet Take 100 mg by mouth daily.    simvastatin (ZOCOR) 20 MG tablet Take 20 mg by mouth at bedtime.   Sodium Phosphates (RA SALINE ENEMA RE) Constipation ( 3 of 4 ) If not relieved by Bisacodyl suppository give disposable Saline Enema rectally x 1 dose /24 hrs as needed   (Do not use constipation standing orders for residents with renal failure/CFR less than 30. Contact MD for orders)    Constipation (4 of 4): Contact MD as needed if no results from enema (Nursing  Measure)   traMADol (ULTRAM) 50 MG tablet Take 1 tablet (50 mg total) by mouth daily as needed. Take one tab a day prn pain--do NOT take within 12 hours of Zoloft administration!!   No facility-administered encounter medications on file as of 01/27/2019.     Review of Systems   In general she is not complaining of any fever or chills.  Skin is not complaining of rashes itching-she does have an abdominal surgical wound that is currently covered --thought to be stable per discussion with wound care nurse who follows this  Head ears eyes nose mouth and throat is not complain of visual changes or sore throat.  Respiratory does not complain of shortness of breath or cough from baseline.  Cardiac is not complaining of chest pain or increased edema.  GI has a history here as noted above but not really complaining of any increased abdominal pain from what her baseline is more lower abdominal more related to  movement.  GU denies dysuria.  Musculoskeletal has continued weakness and does complain of somewhat diffuse pain more so of her back- she has been quite adamant that she does need her Vicodin as needed twice a day.  Neurologic does not complain of dizziness headache numbness does complain of weakness.  Psych does have a history of depression does have somewhat of a flat affect she is on Zoloft and we do have psychiatric nurse practitioner consult ordered  Immunization History  Administered Date(s) Administered   Influenza-Unspecified 07/24/2018   Pertinent  Health Maintenance Due  Topic Date Due   MAMMOGRAM  11/07/1996   FOOT EXAM  02/13/2019 (Originally 11/07/1956)   OPHTHALMOLOGY EXAM  02/13/2019 (Originally 11/07/1956)   URINE MICROALBUMIN  02/13/2019 (Originally 11/07/1956)   DEXA SCAN  02/13/2019 (Originally 11/08/2011)   PNA vac Low Risk Adult (1 of 2 - PCV13) 02/13/2019 (Originally 11/08/2011)   HEMOGLOBIN A1C  02/07/2019   COLONOSCOPY  10/25/2028   INFLUENZA  VACCINE  Completed   No flowsheet data found. Functional Status Survey:    Temperature is 97.3 pulse 84 respirations of 17 blood pressure 131/91 O2 saturation is 97% on room air  Physical Exam\  In general this is a pleasant female in no distress she is up in the wheelchair today which is encouraging.  Skin is warm and dry surgical wound mid abdomen is currently covered and followed by wound care as noted above.  Eyes visual acuity appears to be intact sclera and conjunctive are clear.  Oropharynx is clear mucous membranes moist.  Chest is clear to auscultation there is no labored breathing.  Air entry is slightly shallow.  Heart is irregular irregular rate and rhythm without murmur gallop or rub she has minimal lower extremity edema.  Abdomen she does have a colostomy bag in place as well as covering over her surgical wound abdomen is soft does not appear to be acutely tender to palpation  there are positive bowel sounds  Musculoskeletal appears able to move all extremities x4 with some lower extremity weakness which is been baseline.  Neurologic as noted above her speech is clear cannot really appreciate lateralizing findings.  Psych she is alert and oriented pleasant appropriate continues to have somewhat of a flat affect continues to deny any plans that would harm herself.      Labs reviewed: Recent Labs    01/01/19 0447 01/02/19 0518 01/03/19 0259  01/05/19 0356 01/06/19 0352 01/07/19 0419 01/08/19 0330 01/09/19 0452 01/20/19 01/23/19  NA 146* 143 139   < > 142 141 142  --   --  145 145  K 4.1 4.0 4.5   < > 4.7 4.3 4.1  --   --  3.7 4.0  CL 105 101 96*   < > 99 100 102  --   --  105 105   105  CO2 33* 34* 34*   < > 32 35* 34*  --   --  28 27   27   GLUCOSE 214* 181* 290*   < > 103* 109* 123*  --   --   --   --   BUN 31* 25* 27*   < > 37* 34* 33*  --   --  23* 22*  CREATININE 0.68 0.61 0.74   < > 0.96 0.80 0.68  --   --  0.7 0.5  CALCIUM 7.8* 7.4* 7.4*   < >  7.7* 7.6* 7.8*  --   --  7.9 8.5  8.5  MG 2.0 1.4* 1.7  --  1.8 1.7 1.7 1.7 1.7  --   --   PHOS 2.3* 3.1 3.4  --   --   --   --   --   --   --   --    < > = values in this interval not displayed.   Recent Labs    12/28/18 0323 12/30/18 0319 12/31/18 0337 01/02/19 0518 01/20/19 01/23/19  AST 38 21 20  --  21 12*  ALT 25 23 20   --  33 20  ALKPHOS 62 87 84  --   --   --   BILITOT 0.4 0.9 0.6  --   --   --   PROT 4.5* 4.4* 4.7*  --  4.8* 5.3*   5.3*  ALBUMIN 1.9* 1.5* 1.5* 1.7* 2.2 2.4   2.4   Recent Labs    12/28/18 0323 12/29/18 0400 12/30/18 0319  01/08/19 0330 01/09/19 0452 01/10/19 0357 01/20/19 01/23/19  WBC 7.1 5.3 6.8   < > 10.9* 9.0 10.2 15.4 14.7  NEUTROABS 4.3 3.3 4.6  --   --   --   --   --   --   HGB 10.3* 9.5* 9.3*   < > 8.6* 8.7* 8.9* 10.1* 10.0*  HCT 33.7* 31.1* 30.0*   < > 28.7* 29.7* 29.9* 31* 31*  MCV 96.0 96.0 96.8   < > 98.3 99.0 97.7  --   --   PLT 250 141* 128*   < > 324 375 412* 382 364   < > = values in this interval not displayed.   Lab Results  Component Value Date   TSH 1.894 01/03/2019   Lab Results  Component Value Date   HGBA1C 5.6 08/09/2018   Lab Results  Component Value Date   CHOL 125 08/09/2018   HDL 30 (L) 08/09/2018   LDLCALC 68 08/09/2018   TRIG 202 (H) 12/31/2018   CHOLHDL 4.2 08/09/2018    Significant Diagnostic Results in last 30 days:  Dg Abd 1 View  Result Date: 12/31/2018 CLINICAL DATA:  73 year old female enteric tube placement. Postoperative day 4 distal large bowel resection due to obstructing tumor. EXAM: ABDOMEN - 1 VIEW COMPARISON:  12/26/2018. FINDINGS: AP supine view at 0120 hours. Enteric tube tip is at the level of the gastric body. The side hole is at the level of the gastric fundus. Resolved gas-filled transverse colon, but continued gas-filled small bowel loops in the lower abdomen and pelvis which are at the upper limits of normal to mildly dilated. New lower abdominal skin staples. Negative lung bases. No  acute osseous abnormality identified. IMPRESSION: 1. Enteric tube in the stomach, side hole the level of the fundus. 2. Improved bowel gas pattern since 12/26/2018. Borderline to mildly dilated small bowel loops probably reflect postoperative ileus. Electronically Signed   By: Genevie Ann M.D.   On: 12/31/2018 02:38   Ct Abdomen Pelvis W Contrast  Result Date: 01/05/2019 CLINICAL DATA:  Postop infection. Sigmoid colectomy with Hartmann's pouch and colostomy. EXAM: CT ABDOMEN AND PELVIS WITH CONTRAST TECHNIQUE: Multidetector CT imaging of the abdomen and pelvis was performed using the standard protocol following bolus administration of intravenous contrast. CONTRAST:  147mL OMNIPAQUE IOHEXOL 300 MG/ML  SOLN COMPARISON:  12/26/2018 FINDINGS: Lower chest: Small bilateral pleural effusions. Bibasilar atelectasis. Heart is mildly enlarged. Hepatobiliary: Irregular low-density lesion in the caudate lobe measures up to 3.3 cm compatible with the known metastasis. No biliary ductal dilatation.  Possible gallstone noted within the gallbladder. Pancreas: No focal abnormality or ductal dilatation. Spleen: Scattered hypodensities in the spleen, nonspecific. Normal size. Adrenals/Urinary Tract: Left adrenal lesion again noted measuring 3 cm. This is stable. Right adrenal gland is unremarkable. No focal renal abnormality or hydronephrosis. Urinary bladder grossly unremarkable. Stomach/Bowel: Changes of partial colectomy right lower quadrant ostomy noted. Stomach is decompressed. No evidence of bowel obstruction. Vascular/Lymphatic: Aortic atherosclerosis. No enlarged abdominal or pelvic lymph nodes. Reproductive: Uterus and adnexa unremarkable.  No mass. Other: Moderate free fluid in the abdomen and pelvis. Open midline incision noted. Locules of free air throughout the upper abdominal mesentery/omentum with stranding, presumably postoperative. Musculoskeletal: No acute bony abnormality. IMPRESSION: Postoperative changes from  partial colectomy. Right lower quadrant ostomy noted. No evidence of bowel obstruction. There is stranding and locules of gas within the upper abdominal mesentery/omentum, presumably postoperative. Moderate free fluid in the abdomen and pelvis. Small bilateral pleural effusions.  Bibasilar atelectasis. Low-density lesion in the caudate lobe compatible with known metastasis. Scattered hypodensities throughout the spleen, nonspecific. Stable left adrenal nodule/adenoma as seen on prior MRI. Electronically Signed   By: Rolm Baptise M.D.   On: 01/05/2019 15:53   Dg Chest Port 1 View  Result Date: 12/31/2018 CLINICAL DATA:  wheezing EXAM: PORTABLE CHEST 1 VIEW COMPARISON:  12/21/2018 FINDINGS: Patient has RIGHT-sided PowerPort, tip to the superior vena cava. Nasogastric tube has been placed, tip overlying the level of the proximal stomach. The heart is enlarged. There is pulmonary vascular congestion but no alveolar edema. No consolidations. IMPRESSION: Cardiomegaly and vascular congestion. Electronically Signed   By: Nolon Nations M.D.   On: 12/31/2018 13:01    Assessment/Plan  #1 history of sigmoid colon cancer status post resection-with diagnosis of primary sigmoid cancer with metastasis.  Appears her lower abdominal pain appears to be relatively at baseline today has not increased- I have spoken with her surgeon's office and Dr. Hassell Done has ordered a CT of the abdomen appears for March 20 at [facility] radiology I have alerted nursing to look for further orders  She continues quite adamantly pain control and we will renew her Vicodin order for 5 more days again we have started PRN tramadol with hopes this will help.  She also has PRN Tylenol.  2 history of leukocytosis we have done updated labs today-results are pending-she is afebrile continues to deny any fever chills-or increased cough or shortness of breath from baseline  We will continue to monitor  #3 atrial  fibrillation at this point  appears to be controlled with Cardizem she is on Eliquis for anticoagulation  4.  Anemia this appears to show stability with a hemoglobin of around 10 will await update lab  [id number]  Addendum.  We have the updated results today's labs that shows her white count has come down to 12,300 hemoglobin is down slightly at 9.4.  Platelets are 348.  Sodium is 144 potassium 3.8 BUN 18 creatinine 0.58.  Calcium is 7.8 it has run in the low eights high sevens recently.  We will have this updated on Thursday March

## 2019-01-29 ENCOUNTER — Telehealth: Payer: Self-pay | Admitting: *Deleted

## 2019-01-29 ENCOUNTER — Other Ambulatory Visit: Payer: Self-pay

## 2019-01-29 ENCOUNTER — Other Ambulatory Visit: Payer: Self-pay | Admitting: *Deleted

## 2019-01-29 ENCOUNTER — Ambulatory Visit: Payer: Self-pay | Admitting: Hematology

## 2019-01-29 ENCOUNTER — Ambulatory Visit: Payer: Self-pay

## 2019-01-29 NOTE — Telephone Encounter (Signed)
Medical Records faxed to [employer]; [phone]

## 2019-01-29 NOTE — Patient Outreach (Signed)
[facility]) Care Management  01/29/2019  Marybella Ethier [place] [birth year] [id number]   Transition of Care Referral   Referral Date: 01/28/19 Referral Source:  [facility] inpatient referral  Date of Admission:  01/08/19 Date of Discharge:Facility:  [facility] on 01/26/19.   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 [facility] RN CM left HIPAA compliant voicemail message along with CM's contact info.   Plan: [facility] RN CM sent an unsuccessful outreach letter and scheduled this patient for another call attempt within 4 business days   Tamsen Reist L. Lavina Hamman, RN, BSN, [place] [facility] Care Coordinator Direct Number [phone] Mobile number [phone]  Main [facility] number [phone] Fax number [phone]

## 2019-01-30 ENCOUNTER — Ambulatory Visit: Payer: Self-pay | Admitting: *Deleted

## 2019-01-30 LAB — BASIC METABOLIC PANEL
BUN: 22 — AB (ref 4–21)
Creatinine: 0.6 (ref 0.5–1.1)
GLUCOSE: 193
Potassium: 3.9 (ref 3.4–5.3)
Sodium: 142 (ref 137–147)

## 2019-01-30 LAB — CBC AND DIFFERENTIAL
HCT: 28 — AB (ref 36–46)
Hemoglobin: 9.1 — AB (ref 12.0–16.0)
Platelets: 361 (ref 150–399)
WBC: 11.8

## 2019-01-31 ENCOUNTER — Inpatient Hospital Stay (HOSPITAL_COMMUNITY): Payer: Medicare HMO

## 2019-01-31 ENCOUNTER — Non-Acute Institutional Stay (SKILLED_NURSING_FACILITY): Payer: Medicare HMO | Admitting: Internal Medicine

## 2019-01-31 ENCOUNTER — Encounter (HOSPITAL_COMMUNITY): Payer: Self-pay | Admitting: Pharmacy Technician

## 2019-01-31 ENCOUNTER — Inpatient Hospital Stay (HOSPITAL_COMMUNITY)
Admission: EM | Admit: 2019-01-31 | Discharge: 2019-02-09 | DRG: 862 | Disposition: A | Discharge status: Skilled Nursing Facility | Payer: Medicare HMO | Source: Skilled Nursing Facility | Attending: Internal Medicine | Admitting: Internal Medicine

## 2019-01-31 ENCOUNTER — Other Ambulatory Visit: Payer: Self-pay

## 2019-01-31 ENCOUNTER — Other Ambulatory Visit: Payer: Self-pay | Admitting: *Deleted

## 2019-01-31 ENCOUNTER — Encounter: Payer: Self-pay | Admitting: Internal Medicine

## 2019-01-31 ENCOUNTER — Ambulatory Visit
Admission: RE | Admit: 2019-01-31 | Discharge: 2019-01-31 | Disposition: A | Discharge status: Home / Self Care | Payer: Medicare HMO | Source: Ambulatory Visit | Attending: Surgery | Admitting: Surgery

## 2019-01-31 DIAGNOSIS — C186 Malignant neoplasm of descending colon: Secondary | ICD-10-CM | POA: Diagnosis not present

## 2019-01-31 DIAGNOSIS — Z79899 Other long term (current) drug therapy: Secondary | ICD-10-CM

## 2019-01-31 DIAGNOSIS — T8143XA Infection following a procedure, organ and space surgical site, initial encounter: Secondary | ICD-10-CM | POA: Diagnosis not present

## 2019-01-31 DIAGNOSIS — E785 Hyperlipidemia, unspecified: Secondary | ICD-10-CM | POA: Diagnosis present

## 2019-01-31 DIAGNOSIS — E876 Hypokalemia: Secondary | ICD-10-CM | POA: Diagnosis not present

## 2019-01-31 DIAGNOSIS — D630 Anemia in neoplastic disease: Secondary | ICD-10-CM | POA: Diagnosis present

## 2019-01-31 DIAGNOSIS — Z433 Encounter for attention to colostomy: Secondary | ICD-10-CM | POA: Diagnosis not present

## 2019-01-31 DIAGNOSIS — Z794 Long term (current) use of insulin: Secondary | ICD-10-CM

## 2019-01-31 DIAGNOSIS — B952 Enterococcus as the cause of diseases classified elsewhere: Secondary | ICD-10-CM | POA: Diagnosis present

## 2019-01-31 DIAGNOSIS — E669 Obesity, unspecified: Secondary | ICD-10-CM | POA: Diagnosis present

## 2019-01-31 DIAGNOSIS — R188 Other ascites: Secondary | ICD-10-CM | POA: Diagnosis present

## 2019-01-31 DIAGNOSIS — I4821 Permanent atrial fibrillation: Secondary | ICD-10-CM | POA: Diagnosis present

## 2019-01-31 DIAGNOSIS — E1165 Type 2 diabetes mellitus with hyperglycemia: Secondary | ICD-10-CM | POA: Diagnosis not present

## 2019-01-31 DIAGNOSIS — R627 Adult failure to thrive: Secondary | ICD-10-CM | POA: Diagnosis present

## 2019-01-31 DIAGNOSIS — Z7401 Bed confinement status: Secondary | ICD-10-CM | POA: Diagnosis not present

## 2019-01-31 DIAGNOSIS — Z7901 Long term (current) use of anticoagulants: Secondary | ICD-10-CM | POA: Diagnosis not present

## 2019-01-31 DIAGNOSIS — C787 Secondary malignant neoplasm of liver and intrahepatic bile duct: Secondary | ICD-10-CM | POA: Diagnosis present

## 2019-01-31 DIAGNOSIS — Z9049 Acquired absence of other specified parts of digestive tract: Secondary | ICD-10-CM

## 2019-01-31 DIAGNOSIS — Z87898 Personal history of other specified conditions: Secondary | ICD-10-CM | POA: Diagnosis not present

## 2019-01-31 DIAGNOSIS — I482 Chronic atrial fibrillation, unspecified: Secondary | ICD-10-CM | POA: Diagnosis not present

## 2019-01-31 DIAGNOSIS — Z6831 Body mass index (BMI) 31.0-31.9, adult: Secondary | ICD-10-CM

## 2019-01-31 DIAGNOSIS — Z95828 Presence of other vascular implants and grafts: Secondary | ICD-10-CM

## 2019-01-31 DIAGNOSIS — I4891 Unspecified atrial fibrillation: Secondary | ICD-10-CM | POA: Diagnosis not present

## 2019-01-31 DIAGNOSIS — N183 Chronic kidney disease, stage 3 (moderate): Secondary | ICD-10-CM | POA: Diagnosis not present

## 2019-01-31 DIAGNOSIS — K0889 Other specified disorders of teeth and supporting structures: Secondary | ICD-10-CM | POA: Diagnosis present

## 2019-01-31 DIAGNOSIS — I6782 Cerebral ischemia: Secondary | ICD-10-CM | POA: Diagnosis not present

## 2019-01-31 DIAGNOSIS — K651 Peritoneal abscess: Secondary | ICD-10-CM | POA: Diagnosis present

## 2019-01-31 DIAGNOSIS — R41841 Cognitive communication deficit: Secondary | ICD-10-CM | POA: Diagnosis not present

## 2019-01-31 DIAGNOSIS — I130 Hypertensive heart and chronic kidney disease with heart failure and stage 1 through stage 4 chronic kidney disease, or unspecified chronic kidney disease: Secondary | ICD-10-CM | POA: Diagnosis not present

## 2019-01-31 DIAGNOSIS — E43 Unspecified severe protein-calorie malnutrition: Secondary | ICD-10-CM | POA: Diagnosis present

## 2019-01-31 DIAGNOSIS — I959 Hypotension, unspecified: Secondary | ICD-10-CM | POA: Diagnosis not present

## 2019-01-31 DIAGNOSIS — Z8249 Family history of ischemic heart disease and other diseases of the circulatory system: Secondary | ICD-10-CM

## 2019-01-31 DIAGNOSIS — K56609 Unspecified intestinal obstruction, unspecified as to partial versus complete obstruction: Secondary | ICD-10-CM | POA: Diagnosis not present

## 2019-01-31 DIAGNOSIS — I5033 Acute on chronic diastolic (congestive) heart failure: Secondary | ICD-10-CM | POA: Diagnosis not present

## 2019-01-31 DIAGNOSIS — K6811 Postprocedural retroperitoneal abscess: Secondary | ICD-10-CM | POA: Diagnosis not present

## 2019-01-31 DIAGNOSIS — Z888 Allergy status to other drugs, medicaments and biological substances status: Secondary | ICD-10-CM

## 2019-01-31 DIAGNOSIS — M545 Low back pain: Secondary | ICD-10-CM | POA: Diagnosis present

## 2019-01-31 DIAGNOSIS — E119 Type 2 diabetes mellitus without complications: Secondary | ICD-10-CM

## 2019-01-31 DIAGNOSIS — R072 Precordial pain: Secondary | ICD-10-CM | POA: Diagnosis not present

## 2019-01-31 DIAGNOSIS — T8143XD Infection following a procedure, organ and space surgical site, subsequent encounter: Secondary | ICD-10-CM | POA: Diagnosis not present

## 2019-01-31 DIAGNOSIS — R1030 Lower abdominal pain, unspecified: Secondary | ICD-10-CM

## 2019-01-31 DIAGNOSIS — R1084 Generalized abdominal pain: Secondary | ICD-10-CM | POA: Diagnosis not present

## 2019-01-31 DIAGNOSIS — D631 Anemia in chronic kidney disease: Secondary | ICD-10-CM | POA: Diagnosis present

## 2019-01-31 DIAGNOSIS — T8149XA Infection following a procedure, other surgical site, initial encounter: Secondary | ICD-10-CM | POA: Diagnosis not present

## 2019-01-31 DIAGNOSIS — R262 Difficulty in walking, not elsewhere classified: Secondary | ICD-10-CM | POA: Diagnosis not present

## 2019-01-31 DIAGNOSIS — M6281 Muscle weakness (generalized): Secondary | ICD-10-CM | POA: Diagnosis not present

## 2019-01-31 DIAGNOSIS — G8918 Other acute postprocedural pain: Secondary | ICD-10-CM | POA: Diagnosis not present

## 2019-01-31 DIAGNOSIS — E1122 Type 2 diabetes mellitus with diabetic chronic kidney disease: Secondary | ICD-10-CM | POA: Diagnosis present

## 2019-01-31 DIAGNOSIS — Y836 Removal of other organ (partial) (total) as the cause of abnormal reaction of the patient, or of later complication, without mention of misadventure at the time of the procedure: Secondary | ICD-10-CM | POA: Diagnosis present

## 2019-01-31 DIAGNOSIS — R000 Tachycardia, unspecified: Secondary | ICD-10-CM | POA: Diagnosis present

## 2019-01-31 DIAGNOSIS — Z79890 Hormone replacement therapy (postmenopausal): Secondary | ICD-10-CM

## 2019-01-31 DIAGNOSIS — F329 Major depressive disorder, single episode, unspecified: Secondary | ICD-10-CM | POA: Diagnosis present

## 2019-01-31 DIAGNOSIS — I10 Essential (primary) hypertension: Secondary | ICD-10-CM | POA: Diagnosis present

## 2019-01-31 DIAGNOSIS — Z933 Colostomy status: Secondary | ICD-10-CM

## 2019-01-31 DIAGNOSIS — IMO0001 Reserved for inherently not codable concepts without codable children: Secondary | ICD-10-CM

## 2019-01-31 DIAGNOSIS — M2550 Pain in unspecified joint: Secondary | ICD-10-CM | POA: Diagnosis not present

## 2019-01-31 DIAGNOSIS — C187 Malignant neoplasm of sigmoid colon: Secondary | ICD-10-CM | POA: Diagnosis present

## 2019-01-31 DIAGNOSIS — R52 Pain, unspecified: Secondary | ICD-10-CM | POA: Diagnosis not present

## 2019-01-31 DIAGNOSIS — R079 Chest pain, unspecified: Secondary | ICD-10-CM | POA: Diagnosis not present

## 2019-01-31 HISTORY — PX: OTHER SURGICAL HISTORY: SHX169

## 2019-01-31 LAB — CBC WITH DIFFERENTIAL/PLATELET
ABS IMMATURE GRANULOCYTES: 0.06 10*3/uL (ref 0.00–0.07)
Basophils Absolute: 0 10*3/uL (ref 0.0–0.1)
Basophils Relative: 0 %
Eosinophils Absolute: 0 10*3/uL (ref 0.0–0.5)
Eosinophils Relative: 0 %
HCT: 28.9 % — ABNORMAL LOW (ref 36.0–46.0)
Hemoglobin: 8.8 g/dL — ABNORMAL LOW (ref 12.0–15.0)
Immature Granulocytes: 1 %
LYMPHS PCT: 7 %
Lymphs Abs: 0.6 10*3/uL — ABNORMAL LOW (ref 0.7–4.0)
MCH: 27.8 pg (ref 26.0–34.0)
MCHC: 30.4 g/dL (ref 30.0–36.0)
MCV: 91.5 fL (ref 80.0–100.0)
MONO ABS: 0.9 10*3/uL (ref 0.1–1.0)
Monocytes Relative: 10 %
Neutro Abs: 7 10*3/uL (ref 1.7–7.7)
Neutrophils Relative %: 82 %
Platelets: 366 10*3/uL (ref 150–400)
RBC: 3.16 MIL/uL — ABNORMAL LOW (ref 3.87–5.11)
RDW: 15.9 % — ABNORMAL HIGH (ref 11.5–15.5)
WBC: 8.6 10*3/uL (ref 4.0–10.5)
nRBC: 0 % (ref 0.0–0.2)

## 2019-01-31 LAB — URINALYSIS, ROUTINE W REFLEX MICROSCOPIC
Bilirubin Urine: NEGATIVE
Glucose, UA: NEGATIVE mg/dL
Ketones, ur: NEGATIVE mg/dL
Leukocytes,Ua: NEGATIVE
Nitrite: POSITIVE — AB
Protein, ur: NEGATIVE mg/dL
Specific Gravity, Urine: >1.046 — ABNORMAL HIGH (ref 1.005–1.030)
pH: 5 (ref 5.0–8.0)

## 2019-01-31 LAB — COMPREHENSIVE METABOLIC PANEL
ALK PHOS: 234 U/L — AB (ref 38–126)
ALT: 15 U/L (ref 0–44)
AST: 11 U/L — ABNORMAL LOW (ref 15–41)
Albumin: 1.6 g/dL — ABNORMAL LOW (ref 3.5–5.0)
Anion gap: 11 (ref 5–15)
BILIRUBIN TOTAL: 0.5 mg/dL (ref 0.3–1.2)
BUN: 19 mg/dL (ref 8–23)
CO2: 24 mmol/L (ref 22–32)
Calcium: 8.5 mg/dL — ABNORMAL LOW (ref 8.9–10.3)
Chloride: 107 mmol/L (ref 98–111)
Creatinine, Ser: 0.64 mg/dL (ref 0.44–1.00)
GFR calc Af Amer: >60 mL/min (ref >60)
Glucose, Bld: 123 mg/dL — ABNORMAL HIGH (ref 70–99)
Potassium: 4 mmol/L (ref 3.5–5.1)
Sodium: 142 mmol/L (ref 135–145)
Total Protein: 5.3 g/dL — ABNORMAL LOW (ref 6.5–8.1)

## 2019-01-31 LAB — BASIC METABOLIC PANEL  EGFR
CALCIUM: 7.8
Calcium: 8
Carbon Dioxide, Total: 25
Carbon Dioxide, Total: 25
Chloride: 105
Chloride: 107

## 2019-01-31 LAB — LACTIC ACID, PLASMA
Lactic Acid, Venous: 1 mmol/L (ref 0.5–1.9)
Lactic Acid, Venous: 0.7 mmol/L (ref 0.5–1.9)

## 2019-01-31 LAB — GLUCOSE, CAPILLARY: Glucose-Capillary: 95 mg/dL (ref 70–99)

## 2019-01-31 LAB — PROTIME-INR
INR: 2.6 — ABNORMAL HIGH (ref 0.8–1.2)
Prothrombin Time: 27.7 seconds — ABNORMAL HIGH (ref 11.4–15.2)

## 2019-01-31 MED ORDER — PIPERACILLIN-TAZOBACTAM 3.375 G IVPB: 3.3750 g | Freq: Three times a day (TID) | INTRAVENOUS | Status: DC

## 2019-01-31 MED ORDER — SODIUM CHLORIDE 0.9% FLUSH
5.0000 mL | Freq: Three times a day (TID) | INTRAVENOUS | Status: DC
  Administered 2019-01-31 – 2019-02-09 (×25): 5 mL

## 2019-01-31 MED ORDER — PIPERACILLIN-TAZOBACTAM 3.375 G IVPB
3.3750 g | Freq: Three times a day (TID) | INTRAVENOUS | Status: DC
  Administered 2019-01-31 – 2019-02-04 (×12): 3.375 g via INTRAVENOUS
  Filled 2019-01-31 (×11): qty 50

## 2019-01-31 MED ORDER — DILTIAZEM HCL ER COATED BEADS 120 MG PO CP24
120.0000 mg | ORAL_CAPSULE | Freq: Every day | ORAL | Status: DC
  Administered 2019-02-01 – 2019-02-05 (×5): 120 mg via ORAL
  Filled 2019-01-31 (×6): qty 1

## 2019-01-31 MED ORDER — FENTANYL CITRATE (PF) 100 MCG/2ML IJ SOLN
INTRAMUSCULAR | Status: AC | PRN
  Administered 2019-01-31: 50 ug via INTRAVENOUS

## 2019-01-31 MED ORDER — ACETAMINOPHEN 325 MG PO TABS
650.0000 mg | ORAL_TABLET | ORAL | Status: DC | PRN
  Administered 2019-02-01 – 2019-02-08 (×16): 650 mg via ORAL
  Filled 2019-01-31 (×20): qty 2

## 2019-01-31 MED ORDER — PIPERACILLIN-TAZOBACTAM 3.375 G IVPB 30 MIN
3.3750 g | Freq: Once | INTRAVENOUS | Status: AC
  Administered 2019-01-31: 3.375 g via INTRAVENOUS
  Filled 2019-01-31: qty 50

## 2019-01-31 MED ORDER — SIMVASTATIN 20 MG PO TABS
20.0000 mg | ORAL_TABLET | Freq: Every day | ORAL | Status: DC
  Administered 2019-01-31 – 2019-02-08 (×9): 20 mg via ORAL
  Filled 2019-01-31 (×9): qty 1

## 2019-01-31 MED ORDER — OXYBUTYNIN CHLORIDE ER 10 MG PO TB24
10.0000 mg | ORAL_TABLET | Freq: Every day | ORAL | Status: DC
  Administered 2019-02-01 – 2019-02-09 (×9): 10 mg via ORAL
  Filled 2019-01-31 (×9): qty 1

## 2019-01-31 MED ORDER — FENTANYL CITRATE (PF) 100 MCG/2ML IJ SOLN
INTRAMUSCULAR | Status: AC
  Filled 2019-01-31: qty 2

## 2019-01-31 MED ORDER — INSULIN ASPART 100 UNIT/ML ~~LOC~~ SOLN
0.0000 [IU] | Freq: Three times a day (TID) | SUBCUTANEOUS | Status: DC
  Administered 2019-02-01: 2 [IU] via SUBCUTANEOUS
  Administered 2019-02-01: 3 [IU] via SUBCUTANEOUS
  Administered 2019-02-02: 2 [IU] via SUBCUTANEOUS
  Administered 2019-02-02: 1 [IU] via SUBCUTANEOUS
  Administered 2019-02-02: 2 [IU] via SUBCUTANEOUS
  Administered 2019-02-02: 3 [IU] via SUBCUTANEOUS
  Administered 2019-02-03 (×2): 2 [IU] via SUBCUTANEOUS
  Administered 2019-02-04: 3 [IU] via SUBCUTANEOUS
  Administered 2019-02-04: 2 [IU] via SUBCUTANEOUS
  Administered 2019-02-04: 1 [IU] via SUBCUTANEOUS
  Administered 2019-02-05: 2 [IU] via SUBCUTANEOUS
  Administered 2019-02-05: 3 [IU] via SUBCUTANEOUS
  Administered 2019-02-06 (×3): 2 [IU] via SUBCUTANEOUS
  Administered 2019-02-07: 9 [IU] via SUBCUTANEOUS
  Administered 2019-02-07: 1 [IU] via SUBCUTANEOUS
  Administered 2019-02-07: 7 [IU] via SUBCUTANEOUS
  Administered 2019-02-08 (×2): 2 [IU] via SUBCUTANEOUS
  Administered 2019-02-08: 7 [IU] via SUBCUTANEOUS
  Administered 2019-02-09: 1 [IU] via SUBCUTANEOUS
  Administered 2019-02-09: 3 [IU] via SUBCUTANEOUS

## 2019-01-31 MED ORDER — INSULIN GLARGINE 100 UNIT/ML ~~LOC~~ SOLN
10.0000 [IU] | Freq: Every day | SUBCUTANEOUS | Status: DC
  Administered 2019-01-31 – 2019-02-01 (×2): 10 [IU] via SUBCUTANEOUS
  Filled 2019-01-31 (×2): qty 0.1

## 2019-01-31 MED ORDER — ENSURE ENLIVE PO LIQD
237.0000 mL | ORAL | Status: DC
  Administered 2019-02-02 – 2019-02-08 (×7): 237 mL via ORAL

## 2019-01-31 MED ORDER — FUROSEMIDE 20 MG PO TABS
20.0000 mg | ORAL_TABLET | Freq: Every day | ORAL | Status: DC
  Administered 2019-01-31 – 2019-02-01 (×2): 20 mg via ORAL
  Filled 2019-01-31 (×2): qty 1

## 2019-01-31 MED ORDER — MELATONIN 3 MG PO TABS
6.0000 mg | ORAL_TABLET | Freq: Every day | ORAL | Status: DC
  Administered 2019-01-31 – 2019-02-08 (×9): 6 mg via ORAL
  Filled 2019-01-31 (×9): qty 2

## 2019-01-31 MED ORDER — MIDAZOLAM HCL 2 MG/2ML IJ SOLN
INTRAMUSCULAR | Status: AC | PRN
  Administered 2019-01-31 (×2): 1 mg via INTRAVENOUS

## 2019-01-31 MED ORDER — SERTRALINE HCL 100 MG PO TABS
100.0000 mg | ORAL_TABLET | Freq: Every day | ORAL | Status: DC
  Administered 2019-01-31 – 2019-02-09 (×10): 100 mg via ORAL
  Filled 2019-01-31 (×10): qty 1

## 2019-01-31 MED ORDER — TRAMADOL HCL 50 MG PO TABS
50.0000 mg | ORAL_TABLET | Freq: Four times a day (QID) | ORAL | Status: DC | PRN
  Administered 2019-02-01 – 2019-02-08 (×13): 50 mg via ORAL
  Filled 2019-01-31 (×13): qty 1

## 2019-01-31 MED ORDER — FERROUS SULFATE 325 (65 FE) MG PO TABS
325.0000 mg | ORAL_TABLET | Freq: Every day | ORAL | Status: DC
  Administered 2019-02-01: 325 mg via ORAL
  Filled 2019-01-31: qty 1

## 2019-01-31 MED ORDER — IOPAMIDOL (ISOVUE-300) INJECTION 61%
100.0000 mL | Freq: Once | INTRAVENOUS | Status: AC | PRN
  Administered 2019-01-31: 100 mL via INTRAVENOUS

## 2019-01-31 MED ORDER — LIDOCAINE HCL (PF) 1 % IJ SOLN
INTRAMUSCULAR | Status: AC
  Filled 2019-01-31: qty 30

## 2019-01-31 MED ORDER — MIDAZOLAM HCL 2 MG/2ML IJ SOLN
INTRAMUSCULAR | Status: AC
  Filled 2019-01-31: qty 2

## 2019-01-31 NOTE — Progress Notes (Signed)
[facility] Progress Note     Subjective: CC: LLE weakness Patient was sent for a CT scan by Dr. Hassell Done - underwent exploratory laparotomy with partial colectomy and transverse end colostomy 12/27/18. Patient has been tolerating a diet and having stool output. Denies nuasea or vomiting. She reports occasional abdominal pain. Patient denies fevers, chills, chest pain, SOB, urinary symptoms. She is unable to tell me why she had a CT scan today, per chart patient had complained of abdominal pain with WBC of 15 3/9.   Patient's main concern is that she does not remember why she had abdominal surgery since she went into the hospital with chest pain and her left leg has been weak since leaving the hospital. She reports she can't walk on left leg.  Objective: Vital signs in last 24 hours: Temp:  [98.5 F (36.9 C)-98.8 F (37.1 C)] 98.8 F (37.1 C) (03/20 1346) Pulse Rate:  [92-104] 104 (03/20 1346) Resp:  [20] 20 (03/20 1153) BP: (106-118)/(70-77) 106/77 (03/20 1346) SpO2:  [95 %] 95 % (03/20 1346)    Intake/Output from previous day: No intake/output data recorded. Intake/Output this shift: No intake/output data recorded.  Physical Exam  Constitutional:  Non-toxic appearance. No distress.  HENT:  Head: Normocephalic and atraumatic.  Right Ear: External ear normal.  Left Ear: External ear normal.  Nose: Nose normal.  Mouth/Throat: Oropharynx is clear and moist. Abnormal dentition.  Eyes: Conjunctivae, EOM and lids are normal.  Neck: Normal range of motion and phonation normal. Neck supple.  Cardiovascular: Normal rate and regular rhythm.  Pulmonary/Chest: Effort normal and breath sounds normal.  Abdominal: Soft. Bowel sounds are normal. She exhibits no distension. There is abdominal tenderness (mild) in the right upper quadrant and epigastric area. There is no rigidity, no rebound and no guarding.  stoma present with stool in ostomy, midline incision mostly healed with  small amount slough in wound base  Musculoskeletal:     Comments: No obvious deformities bilateral upper and lower extremities  Skin: Skin is warm, dry and intact.  Psychiatric: Mood normal. She is apathetic. She has a flat affect.      Lab Results:  No results for input(s): WBC, HGB, HCT, PLT in the last 72 hours. BMET No results for input(s): NA, K, CL, CO2, GLUCOSE, BUN, CREATININE, CALCIUM in the last 72 hours. PT/INR No results for input(s): LABPROT, INR in the last 72 hours. CMP     Component Value Date/Time   NA 145 01/23/2019   K 4.0 01/23/2019   CL 105 01/23/2019   CL 105 01/23/2019   CO2 27 01/23/2019   CO2 27 01/23/2019   GLUCOSE 123 (H) 01/07/2019 0419   BUN 22 (A) 01/23/2019   CREATININE 0.5 01/23/2019   CREATININE 0.68 01/07/2019 0419   CREATININE 1.04 (H) 12/18/2018 1029   CALCIUM 8.5 01/23/2019   CALCIUM 8.5 01/23/2019   PROT 5.3 (A) 01/23/2019   PROT 5.3 (A) 01/23/2019   ALBUMIN 2.4 01/23/2019   ALBUMIN 2.4 01/23/2019   AST 12 (A) 01/23/2019   AST 60 (H) 12/18/2018 1029   ALT 20 01/23/2019   ALT 117 (H) 12/18/2018 1029   ALKPHOS 84 12/31/2018 0337   BILITOT 0.6 12/31/2018 0337   BILITOT 0.5 12/18/2018 1029   GFRNONAA >60 01/07/2019 0419   GFRNONAA 54 (L) 12/18/2018 1029   GFRAA >60 01/07/2019 0419   GFRAA >60 12/18/2018 1029   Lipase     Component Value Date/Time   LIPASE 24 12/25/2018 2254  Studies/Results: Ct Abdomen Pelvis W Contrast  Result Date: 01/31/2019 CLINICAL DATA:  Post laparoscopic procedure with sigmoid colectomy for colon cancer. Diffuse hepatic masses. Two chemotherapy treatments. EXAM: CT ABDOMEN AND PELVIS WITH CONTRAST TECHNIQUE: Multidetector CT imaging of the abdomen and pelvis was performed using the standard protocol following bolus administration of intravenous contrast. CONTRAST:  111mL ISOVUE-300 IOPAMIDOL (ISOVUE-300) INJECTION 61% COMPARISON:  01/05/2019, 12/26/2018 and 11/01/2018 FINDINGS: Lower chest: Lung  bases demonstrate worsening moderate size left effusion with associated basilar atelectasis. Right lung base is clear. Hepatobiliary: Several hypodense masses with the largest over the caudate lobe measuring 2.6 cm without significant change likely metastatic disease. Gallbladder and biliary tree are within normal. Pancreas: Normal. Spleen: Few small stable subcentimeter hypodensities. Adrenals/Urinary Tract: Right adrenal gland is normal. 2.7 x 3.2 cm left adrenal mass without significant change likely metastatic disease. Kidneys are normal size without hydronephrosis or nephrolithiasis. Subcentimeter hypodensity over the upper pole right renal cortex too small to characterize but likely a cyst. Ureters and bladder are normal. Stomach/Bowel: Stomach is within normal. Small bowel is unremarkable. Appendix not visualized. Evidence of patient's previous partial colectomy with ostomy site over the right anterior abdominal wall. Evidence of Hartmann's pouch. Vascular/Lymphatic: Mild calcified plaque over the abdominal aorta. No adenopathy. Reproductive: Unremarkable. Other: There is a large fluid collection containing multiple foci of air with thin rim enhancement over the central lower abdomen extending into the left mid to upper abdomen measuring approximately 12.6 x 24.6 x 18.4 cm and AP, transverse and craniocaudal dimensions. This likely represents a intra-abdominal abscess. There is an oval area over the omentum in the anterior midline upper abdomen measuring 8.4 x 16.6 cm containing fluid and multiple collections of air likely additional site of infection. There is mild ascites. Small right inguinal hernia containing only peritoneal fat. Musculoskeletal: Degenerative change of the spine and hips. IMPRESSION: Postsurgical change compatible recent partial colectomy for colon cancer with Hartmann's pouch and right abdominal wall colostomy. There is a large rim enhancing fluid collection containing multiple foci of  air over the lower abdomen extending into the left mid to upper abdomen measuring 12.6 x 24.6 x 18.4 cm likely an intra-abdominal abscess. There is an oval collection over the anterior midline upper abdomen over the omentum containing fat, fluid and multiple foci of air measuring 8.4 x 16.6 cm likely a second site of infection. Mild ascites. Several liver hypodensities with the largest over the caudate lobe measuring 2.6 cm without significant change likely metastatic disease. 3.2 cm left adrenal mass without significant change likely metastatic disease. Several small splenic hypodensities unchanged and likely benign, although metastatic disease is possible. Worsening moderate size left pleural effusion with associated basilar atelectasis. Subcentimeter right renal cortical hypodensity too small to characterize but likely a cyst. Small right inguinal hernia containing only peritoneal fat. Aortic Atherosclerosis (ICD10-I70.0). These results were called by telephone at the time of interpretation on 01/31/2019 at 11:18 am to Dr. Gurney Maxin, who verbally acknowledged these results. Electronically Signed   By: Marin Olp M.D.   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 Hassell Done Large intra-abdominal abscess  - recommend IR consult for drainage - Last dose eliquis this AM, will need to hold further doses and transition to heparin if patient is unable to hold  anticoagulation - broad spectrum antibiotics - ok to have a diet if no procedures planned for today - stoma care LLE weakness - unlikely that this is related to surgery, PT,  consider neurology consult   FEN: NPO, IVF ID: [id number] 3/20>> VBT:YOMAYOK last dose this morning - will need to hold  Follow up: Dr. Hassell Done  Plan: IR consult for drainage of abscesses. Broad spectrum abx. We will follow but no indication for acute surgical intervention at this time. Unsure of why patient is having LLE weakness but it seems very unlikely that this would have been related to her surgery and it was not a problem in the acute post-operative phase.   LOS: 0 days    Brigid Re , [facility] 01/31/2019, 1:58 PM Pager: [phone] Consults: [phone]

## 2019-01-31 NOTE — Sedation Documentation (Signed)
Patient is resting comfortably. 

## 2019-01-31 NOTE — Patient Outreach (Signed)
[facility]) Care Management  01/31/2019  Cacey Willow [facility], [place] [phone]   Duplicate note  Joelene Millin L. Lavina Hamman, RN, BSN, [place] Coordinator Office number [phone] Mobile number [phone]  Main [facility] number [id number] Fax number [phone]

## 2019-01-31 NOTE — Progress Notes (Signed)
Location:    [place] Room Number: 215/A Place of Service:  SNF [phone]) Provider:  Edwinna Areola, MD  Patient Care Team: [name], MD as PCP - General (Family Medicine) Thompson Grayer, MD as PCP - Cardiology (Cardiology) Thompson Grayer, MD as PCP - Electrophysiology (Cardiology) Ronnette Juniper, MD as Consulting Physician (Gastroenterology) Truitt Merle, MD as Consulting Physician (Medical Oncology) Newt Minion, MD as Consulting Physician (Orthopedic Surgery) Leighton Ruff, MD as Consulting Physician (Colon and Rectal Surgery) Barbaraann Faster, RN as [facility]  Extended Emergency Contact Information Primary Emergency Contact: [place] Phone: [phone] Relation: Son  Code Status:  Full Code Goals of care: Advanced Directive information Advanced Directives 01/31/2019  Does Patient Have a Medical Advance Directive? Yes  Type of Advance Directive (No Data)  Does patient want to make changes to medical advance directive? No - Patient declined  Would patient like information on creating a medical advance directive? No - Patient declined     Chief Complaint  Patient presents with   Acute Visit    Abdominal Abscess    HPI:  Pt is a 73 y.o. female seen today for an acute visit for a CT scan done earlier today which shows a suspected abdominal abscess. 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  She did have a CT scan of her abdomen done today and ordered by her surgeon..  The CT scan was order secondary  to  some complaints of lower abdominal pain as well as an elevated white count which ironically appears to be trending down it went down from over 15,000 to around 11,000 as of yesterday.  She  has been afebrile denies any fever chills--- says her lower abdominal discomfort is relatively unchanged.  However she did have a CT scan of the abdomen today which shows A large rim-enhancing fluid collection with multiple foci of air over the lower abdomen likely an intra-abdominal abscess--- as well as a collection over the anterior midline upper abdomen which is concerning for another site of infection  It also continues to show several liver hypodensities suspicious for metastatic disease.  She currently denies any fever chills says she feels relatively at baseline.  Abdominal pain appears relatively unchanged and more in the lower abdominal area.        Past Medical History:  Diagnosis Date   Acute diastolic heart failure ([place]) 12/22/2018   Arthritis    Atrial fibrillation, chronic 12/22/2018   Cancer of left colon ([place]) 10/30/2018   Cancer of sigmoid colon  12/27/2018   Diabetes mellitus without complication ([place])    Hypertension    Obesity (BMI 30-39.9) 12/27/2018   Past Surgical History:  Procedure Laterality Date   COLONOSCOPY  10/2018   Dr [name].  Large cancer at splenic flexure,  Bulky sigmoid colon mass, Numerous polyps   IR IMAGING GUIDED PORT INSERTION  11/20/2018   LAPAROTOMY N/A 12/27/2018   Procedure: LEFT SIGMOID COLECTOMY WITH HARTMANN POUCH AND END COLOSTOMY;  Surgeon: [name], MD;  Location: [facility]S;  Service: General;  Laterality: N/A;  Allergies  Allergen Reactions   Metformin And Related Nausea And Vomiting   Prednisone Nausea And Vomiting    Outpatient Encounter Medications as of 01/31/2019  Medication Sig   acetaminophen (TYLENOL) 325 MG tablet Take 650 mg by mouth every 4 (four) hours as needed.   apixaban (ELIQUIS) 5 MG TABS tablet TAKE 1  TABLET (5 MG TOTAL) BY MOUTH 2 (TWO) TIMES DAILY.   bisacodyl (DULCOLAX) 10 MG suppository Constipation ( 2 of 4) If not relieved by MOM give 10 mg Bisacodyl suppository rectally x 1 dose in 24 hours as needed ( (Do not use constipation standing orders for residents with renal failure/CFR less than 30. Contact MD for orders)   diltiazem (CARDIZEM CD) 120 MG 24 hr capsule Take 1 capsule (120 mg total) by mouth daily.   feeding supplement, ENSURE ENLIVE, (ENSURE ENLIVE) LIQD Ensure Enlive one by mouth daily for supplement   ferrous sulfate (KP FERROUS SULFATE) 325 (65 FE) MG tablet Take 325 mg by mouth daily with breakfast.   furosemide (LASIX) 20 MG tablet Lasix 20 mg po daily PRN for weight of greater than 3 lbs in a day or 5 lbs in a week   Insulin NPH, Human,, Isophane, (NOVOLIN N FLEXPEN) 100 UNIT/ML Kiwkpen Inject 12 Units into the skin 2 (two) times daily. FOR DM   magnesium hydroxide (MILK OF MAGNESIA) 400 MG/5ML suspension Constipation(1 of 4) If no BM in 3 days give 30 cc Milk of Magnesium  p.o. x 1 dose in 24 hours as needed ( Do not use standing constipation orders for residents with renal failure CFR less than 30. Contact MD for orders)   Melatonin 3 MG TABS Take 6 mg by mouth at bedtime.    NON FORMULARY Med Pass 120 ml po bid   NON FORMULARY NSA Med Pass 120 ml po daily   ondansetron (ZOFRAN) 8 MG tablet Take 8 mg by mouth every 8 (eight) hours as needed for nausea.   oxybutynin (DITROPAN-XL) 10 MG 24 hr tablet Take 10 mg by mouth daily.   sertraline (ZOLOFT) 100 MG tablet Take 100 mg by mouth daily.    simvastatin (ZOCOR) 20 MG tablet Take 20 mg by mouth at bedtime.   Sodium Phosphates (RA SALINE ENEMA RE) Constipation ( 3 of 4 ) If not relieved by Bisacodyl suppository give disposable Saline Enema rectally x 1 dose /24 hrs as needed   (Do not use constipation standing orders for residents with renal failure/CFR less than 30. Contact MD for orders)    Constipation (4 of  4): Contact MD as needed if no results from enema (Nursing Measure)   traMADol (ULTRAM) 50 MG tablet Take 1 tablet (50 mg total) by mouth daily as needed. Take one tab a day prn pain--do NOT take within 12 hours of Zoloft administration!!   [DISCONTINUED] HYDROcodone-acetaminophen (NORCO) 7.5-325 MG tablet Take 1 tablet by mouth 2 (two) times daily as needed for up to 5 days for moderate pain.   [DISCONTINUED] insulin glargine (LANTUS) 100 UNIT/ML injection Inject 0.1 mLs (10 Units total) into the skin at bedtime.   No facility-administered encounter medications on file as of 01/31/2019.     Review of Systems   In general she continues to deny any fever chills.  Skin does not complain of rashes itching or diaphoresis.  Head ears eyes nose mouth and throat is not complain of visual changes or sore throat.  Respiratory is not complaining of increased cough or shortness of breath.  Cardiac does not complain of chest pain has fairly mild lower extremity edema.  GI is not really complaining of acute abdominal discomfort continues to complain of some lower abdominal discomfort at times- says her appetite actually has improved recently.  GU is not complaining of dysuria.  Musculoskeletal has somewhat diffuse joint complaints especially of her back.  Neurologic does have weakness more lower extremities does not complain of dizziness or headache at this time.   And psych has complained of depressive symptoms in the past psych consult is pending she is on Zoloft  Immunization History  Administered Date(s) Administered   Influenza-Unspecified 07/24/2018   Pertinent  Health Maintenance Due  Topic Date Due   MAMMOGRAM  11/07/1996   FOOT EXAM  02/13/2019 (Originally 11/07/1956)   OPHTHALMOLOGY EXAM  02/13/2019 (Originally 11/07/1956)   URINE MICROALBUMIN  02/13/2019 (Originally 11/07/1956)   DEXA SCAN  02/13/2019 (Originally 11/08/2011)   PNA vac Low Risk Adult (1 of 2 - PCV13)  02/13/2019 (Originally 11/08/2011)   HEMOGLOBIN A1C  02/07/2019   COLONOSCOPY  10/25/2028   INFLUENZA VACCINE  Completed    Functional Status Survey:    Vitals:   01/31/19 1153  BP: 118/70  Pulse: 92  Resp: 20  Temp: 98.5 F (36.9 C)  TempSrc: Oral    Physical Exam   In general this is a pleasant elderly female in no distress sitting comfortably in her wheelchair.  Her skin is warm and dry her abdominal surgical site is currently covered.  Oropharynx is clear mucous membranes appear fairly moist.  Eyes visual acuity appears grossly intact.  Chest is clear to auscultation with somewhat shallow air entry there is no labored breathing.  Heart is regular irregular rate and rhythm in the 90s on auscultation she has quite mild lower extremity edema.  Abdomen is somewhat obese colostomy bag is in place surgical wound is currently covered there is some tenderness to palpation more so lower abdomen-bowel sounds are positive.  Musculoskeletal continues with lower extremity weakness is able to move all extremities x4.  Neurologic as noted above her speech is clear she is alert.  Psych she is alert and oriented pleasant and appropriate  Labs reviewed:  January 30, 2019.  WBC 11.8 hemoglobin 9.1 platelets 361.  Sodium 142 potassium 3.9 BUN 22 creatinine 0.55 Recent Labs    01/01/19 0447 01/02/19 0518 01/03/19 0259  01/05/19 0356 01/06/19 0352 01/07/19 0419 01/08/19 0330 01/09/19 0452 01/20/19 01/23/19  NA 146* 143 139   < > 142 141 142  --   --  145 145  K 4.1 4.0 4.5   < > 4.7 4.3 4.1  --   --  3.7 4.0  CL 105 101 96*   < > 99 100 102  --   --  105 105   105  CO2 33* 34* 34*   < > 32 35* 34*  --   --  28 27   27   GLUCOSE 214* 181* 290*   < > 103* 109* 123*  --   --   --   --   BUN 31* 25* 27*   < > 37* 34* 33*  --   --  23* 22*  CREATININE 0.68 0.61 0.74   < > 0.96 0.80 0.68  --   --  0.7 0.5  CALCIUM 7.8* 7.4* 7.4*   < > 7.7* 7.6* 7.8*  --   --  7.9 8.5   8.5   MG 2.0 1.4* 1.7  --  1.8 1.7 1.7 1.7 1.7  --   --   PHOS 2.3* 3.1 3.4  --   --   --   --   --   --   --   --    < > = values in this interval not displayed.   Recent Labs    12/28/18 0323 12/30/18 0319 12/31/18 0337 01/02/19 0518 01/20/19 01/23/19  AST 38 21 20  --  21 12*  ALT 25 23 20   --  33 20  ALKPHOS 62 87 84  --   --   --   BILITOT 0.4 0.9 0.6  --   --   --   PROT 4.5* 4.4* 4.7*  --  4.8* 5.3*   5.3*  ALBUMIN 1.9* 1.5* 1.5* 1.7* 2.2 2.4   2.4   Recent Labs    12/28/18 0323 12/29/18 0400 12/30/18 0319  01/08/19 0330 01/09/19 0452 01/10/19 0357 01/20/19 01/23/19  WBC 7.1 5.3 6.8   < > 10.9* 9.0 10.2 15.4 14.7  NEUTROABS 4.3 3.3 4.6  --   --   --   --   --   --   HGB 10.3* 9.5* 9.3*   < > 8.6* 8.7* 8.9* 10.1* 10.0*  HCT 33.7* 31.1* 30.0*   < > 28.7* 29.7* 29.9* 31* 31*  MCV 96.0 96.0 96.8   < > 98.3 99.0 97.7  --   --   PLT 250 141* 128*   < > 324 375 412* 382 364   < > = values in this interval not displayed.   Lab Results  Component Value Date   TSH 1.894 01/03/2019   Lab Results  Component Value Date   HGBA1C 5.6 08/09/2018   Lab Results  Component Value Date   CHOL 125 08/09/2018   HDL 30 (L) 08/09/2018   LDLCALC 68 08/09/2018   TRIG 202 (H) 12/31/2018   CHOLHDL 4.2 08/09/2018    Significant Diagnostic Results in last 30 days:  Ct Abdomen Pelvis W Contrast  Result Date: 01/31/2019 CLINICAL DATA:  Post laparoscopic procedure with sigmoid colectomy for colon cancer. Diffuse hepatic masses. Two chemotherapy treatments. EXAM: CT ABDOMEN AND PELVIS WITH CONTRAST TECHNIQUE: Multidetector CT imaging of the abdomen and pelvis was performed using the standard protocol following bolus administration of intravenous contrast. CONTRAST:  140mL ISOVUE-300 IOPAMIDOL (ISOVUE-300) INJECTION 61% COMPARISON:  01/05/2019, 12/26/2018 and 11/01/2018 FINDINGS: Lower chest: Lung bases demonstrate worsening moderate size left effusion with associated basilar atelectasis. Right  lung base is clear. Hepatobiliary: Several hypodense masses with the largest over the caudate lobe measuring 2.6 cm without significant change likely metastatic disease. Gallbladder and biliary tree are within normal. Pancreas: Normal. Spleen: Few small stable subcentimeter hypodensities. Adrenals/Urinary Tract: Right adrenal gland is normal. 2.7 x 3.2 cm left adrenal mass without significant change likely metastatic disease. Kidneys are normal size without hydronephrosis or nephrolithiasis. Subcentimeter hypodensity over the upper pole right renal cortex too small to characterize but likely a cyst. Ureters and bladder are normal. Stomach/Bowel: Stomach is within normal. Small bowel is unremarkable. Appendix not visualized. Evidence of patient's previous partial colectomy with ostomy site over the right anterior abdominal wall. Evidence of Hartmann's pouch. Vascular/Lymphatic: Mild calcified plaque over the abdominal aorta. No adenopathy. Reproductive: Unremarkable. Other: There is a large fluid collection containing multiple foci of air with thin rim enhancement over the central lower abdomen extending into the left mid to upper abdomen measuring approximately 12.6 x 24.6 x 18.4 cm and AP, transverse  and craniocaudal dimensions. This likely represents a intra-abdominal abscess. There is an oval area over the omentum in the anterior midline upper abdomen measuring 8.4 x 16.6 cm containing fluid and multiple collections of air likely additional site of infection. There is mild ascites. Small right inguinal hernia containing only peritoneal fat. Musculoskeletal: Degenerative change of the spine and hips. IMPRESSION: Postsurgical change compatible recent partial colectomy for colon cancer with Hartmann's pouch and right abdominal wall colostomy. There is a large rim enhancing fluid collection containing multiple foci of air over the lower abdomen extending into the left mid to upper abdomen measuring 12.6 x 24.6 x 18.4  cm likely an intra-abdominal abscess. There is an oval collection over the anterior midline upper abdomen over the omentum containing fat, fluid and multiple foci of air measuring 8.4 x 16.6 cm likely a second site of infection. Mild ascites. Several liver hypodensities with the largest over the caudate lobe measuring 2.6 cm without significant change likely metastatic disease. 3.2 cm left adrenal mass without significant change likely metastatic disease. Several small splenic hypodensities unchanged and likely benign, although metastatic disease is possible. Worsening moderate size left pleural effusion with associated basilar atelectasis. Subcentimeter right renal cortical hypodensity too small to characterize but likely a cyst. Small right inguinal hernia containing only peritoneal fat. Aortic Atherosclerosis (ICD10-I70.0). These results were called by telephone at the time of interpretation on 01/31/2019 at 11:18 am to Dr. Gurney Maxin, who verbally acknowledged these results. Electronically Signed   By: Marin Olp M.D.   On: 01/31/2019 11:16   Ct Abdomen Pelvis W Contrast  Result Date: 01/05/2019 CLINICAL DATA:  Postop infection. Sigmoid colectomy with Hartmann's pouch and colostomy. EXAM: CT ABDOMEN AND PELVIS WITH CONTRAST TECHNIQUE: Multidetector CT imaging of the abdomen and pelvis was performed using the standard protocol following bolus administration of intravenous contrast. CONTRAST:  148mL OMNIPAQUE IOHEXOL 300 MG/ML  SOLN COMPARISON:  12/26/2018 FINDINGS: Lower chest: Small bilateral pleural effusions. Bibasilar atelectasis. Heart is mildly enlarged. Hepatobiliary: Irregular low-density lesion in the caudate lobe measures up to 3.3 cm compatible with the known metastasis. No biliary ductal dilatation. Possible gallstone noted within the gallbladder. Pancreas: No focal abnormality or ductal dilatation. Spleen: Scattered hypodensities in the spleen, nonspecific. Normal size. Adrenals/Urinary  Tract: Left adrenal lesion again noted measuring 3 cm. This is stable. Right adrenal gland is unremarkable. No focal renal abnormality or hydronephrosis. Urinary bladder grossly unremarkable. Stomach/Bowel: Changes of partial colectomy right lower quadrant ostomy noted. Stomach is decompressed. No evidence of bowel obstruction. Vascular/Lymphatic: Aortic atherosclerosis. No enlarged abdominal or pelvic lymph nodes. Reproductive: Uterus and adnexa unremarkable.  No mass. Other: Moderate free fluid in the abdomen and pelvis. Open midline incision noted. Locules of free air throughout the upper abdominal mesentery/omentum with stranding, presumably postoperative. Musculoskeletal: No acute bony abnormality. IMPRESSION: Postoperative changes from partial colectomy. Right lower quadrant ostomy noted. No evidence of bowel obstruction. There is stranding and locules of gas within the upper abdominal mesentery/omentum, presumably postoperative. Moderate free fluid in the abdomen and pelvis. Small bilateral pleural effusions.  Bibasilar atelectasis. Low-density lesion in the caudate lobe compatible with known metastasis. Scattered hypodensities throughout the spleen, nonspecific. Stable left adrenal nodule/adenoma as seen on prior MRI. Electronically Signed   By: Rolm Baptise M.D.   On: 01/05/2019 15:53    Assessment/Plan  #1-history of abdominal abscess status post recent surgery.  Her surgeon is aware of the CT scan results- and has ordered her to go to the ER and will  do so.  Clinically she appears to be at baseline in no distress- but certainly this will need to be addressed expediently.  [id number]

## 2019-01-31 NOTE — Procedures (Signed)
Interventional Radiology Procedure Note  Procedure: US guided drainage of abdominal abscess.  53F biliary drain.  ~850cc of purulent material.   Complications: None  Recommendations:  - To drain, bulb suction. - routine drain care, record output at least BID - Do not submerge - Routine care  - Would observe the superior collection, as this looks like phlegmon, not abscess, with gas and fat, small fluid.   Signed,  Dulcy Fanny. Earleen Newport, DO

## 2019-01-31 NOTE — ED Notes (Signed)
ED TO INPATIENT HANDOFF REPORT  ED Nurse Name and Phone #: Chester Holstein [phone]  S Name/Age/Gender Cassandra Allen 73 y.o. female Room/Bed: 044C/044C  Code Status   Code Status: Prior  Home/SNF/Other Nursing Home Patient oriented to: self, place, time and situation Is this baseline? Yes   Triage Complete: Triage complete  Chief Complaint ABD pain  Triage Note Pt arrives via ems from [facility] after confirmed abdominal abcess on CT today. Pt with abd pain. Colostomy in place. Recent colectomy. 110/70, HR 68, 98% RA, CBG 142.    Allergies Allergies  Allergen Reactions  . Metformin And Related Nausea And Vomiting  . Prednisone Nausea And Vomiting    Level of Care/Admitting Diagnosis ED Disposition    ED Disposition Condition Comment   Admit  Hospital Area: [facility] [[id number]]  Level of Care: Med-Surg [16]  Diagnosis: Postoperative intra-abdominal abscess [[id number]]  Admitting Physician: Manfred Shirts  Attending Physician: Waldron Labs, DAWOOD S [[id number]]  Estimated length of stay: 3 - 4 days  Certification:: I certify this patient will need inpatient services for at least 2 midnights  PT Class (Do Not Modify): Inpatient [101]  PT Acc Code (Do Not Modify): Private [1]       B Medical/Surgery History Past Medical History:  Diagnosis Date  . Acute diastolic heart failure ([place]) 12/22/2018  . Arthritis   . Atrial fibrillation, chronic 12/22/2018  . Cancer of left colon ([place]) 10/30/2018  . Cancer of sigmoid colon  12/27/2018  . Diabetes mellitus without complication ([place])   . Hypertension   . Obesity (BMI 30-39.9) 12/27/2018   Past Surgical History:  Procedure Laterality Date  . COLONOSCOPY  10/2018   Dr [name].  Large cancer at splenic flexure,  Bulky sigmoid colon mass, Numerous polyps  . IR IMAGING GUIDED PORT INSERTION  11/20/2018  . LAPAROTOMY N/A 12/27/2018   Procedure: LEFT SIGMOID COLECTOMY WITH HARTMANN POUCH AND END COLOSTOMY;  Surgeon:  [name], MD;  Location: [facility] ORS;  Service: General;  Laterality: N/A;     A IV Location/Drains/Wounds Patient Lines/Drains/Airways Status   Active Line/Drains/Airways    Name:   Placement date:   Placement time:   Site:   Days:   Implanted Port Right Chest   -    -    Chest      Colostomy RUQ   12/27/18    1326    RUQ   35   External Urinary Catheter   01/31/19    1425    -   less than 1   Wound / Incision (Open or Dehisced) 01/31/19 Incision - Open Abdomen Left;Anterior;Medial Open incision with necrotic base.  Sutures visible   01/31/19    1303    Abdomen   less than 1          Intake/Output Last 24 hours No intake or output data in the 24 hours ending 01/31/19 1538  Labs/Imaging Results for orders placed or performed during the hospital encounter of 01/31/19 (from the past 48 hour(s))  Comprehensive metabolic panel     Status: Abnormal   Collection Time: 01/31/19  1:01 PM  Result Value Ref Range   Sodium 142 135 - 145 mmol/L   Potassium 4.0 3.5 - 5.1 mmol/L   Chloride 107 98 - 111 mmol/L   CO2 24 22 - 32 mmol/L   Glucose, Bld 123 (H) 70 - 99 mg/dL   BUN 19 8 - 23 mg/dL   Creatinine, Ser 0.64 0.44 -  1.00 mg/dL   Calcium 8.5 (L) 8.9 - 10.3 mg/dL   Total Protein 5.3 (L) 6.5 - 8.1 g/dL   Albumin 1.6 (L) 3.5 - 5.0 g/dL   AST 11 (L) 15 - 41 U/L   ALT 15 0 - 44 U/L   Alkaline Phosphatase 234 (H) 38 - 126 U/L   Total Bilirubin 0.5 0.3 - 1.2 mg/dL   GFR calc non Af Amer >60 >60 mL/min   GFR calc Af Amer >60 >60 mL/min   Anion gap 11 5 - 15    Comment: Performed at [facility], [place] [address] [place], [place] [address]  CBC with Differential     Status: Abnormal   Collection Time: 01/31/19  1:01 PM  Result Value Ref Range   WBC 8.6 4.0 - 10.5 K/uL   RBC 3.16 (L) 3.87 - 5.11 MIL/uL   Hemoglobin 8.8 (L) 12.0 - 15.0 g/dL   HCT 28.9 (L) 36.0 - 46.0 %   MCV 91.5 80.0 - 100.0 fL   MCH 27.8 26.0 - 34.0 pg   MCHC 30.4 30.0 - 36.0 g/dL   RDW 15.9 (H) 11.5 - 15.5 %    Platelets 366 150 - 400 K/uL   nRBC 0.0 0.0 - 0.2 %   Neutrophils Relative % 82 %   Neutro Abs 7.0 1.7 - 7.7 K/uL   Lymphocytes Relative 7 %   Lymphs Abs 0.6 (L) 0.7 - 4.0 K/uL   Monocytes Relative 10 %   Monocytes Absolute 0.9 0.1 - 1.0 K/uL   Eosinophils Relative 0 %   Eosinophils Absolute 0.0 0.0 - 0.5 K/uL   Basophils Relative 0 %   Basophils Absolute 0.0 0.0 - 0.1 K/uL   Immature Granulocytes 1 %   Abs Immature Granulocytes 0.06 0.00 - 0.07 K/uL    Comment: Performed at [facility], [place] [address] [place], [place] [address]  Lactic acid, plasma     Status: None   Collection Time: 01/31/19  1:10 PM  Result Value Ref Range   Lactic Acid, Venous 1.0 0.5 - 1.9 mmol/L    Comment: Performed at [facility] [address], [place], [place] [address]  Lactic acid, plasma     Status: None   Collection Time: 01/31/19  2:27 PM  Result Value Ref Range   Lactic Acid, Venous 0.7 0.5 - 1.9 mmol/L    Comment: Performed at [facility] [address], [place], [place] [address]   Ct Abdomen Pelvis W Contrast  Result Date: 01/31/2019 CLINICAL DATA:  Post laparoscopic procedure with sigmoid colectomy for colon cancer. Diffuse hepatic masses. Two chemotherapy treatments. EXAM: CT ABDOMEN AND PELVIS WITH CONTRAST TECHNIQUE: Multidetector CT imaging of the abdomen and pelvis was performed using the standard protocol following bolus administration of intravenous contrast. CONTRAST:  188mL ISOVUE-300 IOPAMIDOL (ISOVUE-300) INJECTION 61% COMPARISON:  01/05/2019, 12/26/2018 and 11/01/2018 FINDINGS: Lower chest: Lung bases demonstrate worsening moderate size left effusion with associated basilar atelectasis. Right lung base is clear. Hepatobiliary: Several hypodense masses with the largest over the caudate lobe measuring 2.6 cm without significant change likely metastatic disease. Gallbladder and biliary tree are within normal. Pancreas: Normal. Spleen: Few small stable subcentimeter  hypodensities. Adrenals/Urinary Tract: Right adrenal gland is normal. 2.7 x 3.2 cm left adrenal mass without significant change likely metastatic disease. Kidneys are normal size without hydronephrosis or nephrolithiasis. Subcentimeter hypodensity over the upper pole right renal cortex too small to characterize but likely a cyst.  Ureters and bladder are normal. Stomach/Bowel: Stomach is within normal. Small bowel is unremarkable. Appendix not visualized. Evidence of patient's previous partial colectomy with ostomy site over the right anterior abdominal wall. Evidence of Hartmann's pouch. Vascular/Lymphatic: Mild calcified plaque over the abdominal aorta. No adenopathy. Reproductive: Unremarkable. Other: There is a large fluid collection containing multiple foci of air with thin rim enhancement over the central lower abdomen extending into the left mid to upper abdomen measuring approximately 12.6 x 24.6 x 18.4 cm and AP, transverse and craniocaudal dimensions. This likely represents a intra-abdominal abscess. There is an oval area over the omentum in the anterior midline upper abdomen measuring 8.4 x 16.6 cm containing fluid and multiple collections of air likely additional site of infection. There is mild ascites. Small right inguinal hernia containing only peritoneal fat. Musculoskeletal: Degenerative change of the spine and hips. IMPRESSION: Postsurgical change compatible recent partial colectomy for colon cancer with Hartmann's pouch and right abdominal wall colostomy. There is a large rim enhancing fluid collection containing multiple foci of air over the lower abdomen extending into the left mid to upper abdomen measuring 12.6 x 24.6 x 18.4 cm likely an intra-abdominal abscess. There is an oval collection over the anterior midline upper abdomen over the omentum containing fat, fluid and multiple foci of air measuring 8.4 x 16.6 cm likely a second site of infection. Mild ascites. Several liver hypodensities  with the largest over the caudate lobe measuring 2.6 cm without significant change likely metastatic disease. 3.2 cm left adrenal mass without significant change likely metastatic disease. Several small splenic hypodensities unchanged and likely benign, although metastatic disease is possible. Worsening moderate size left pleural effusion with associated basilar atelectasis. Subcentimeter right renal cortical hypodensity too small to characterize but likely a cyst. Small right inguinal hernia containing only peritoneal fat. Aortic Atherosclerosis (ICD10-I70.0). These results were called by telephone at the time of interpretation on 01/31/2019 at 11:18 am to Dr. Gurney Maxin, who verbally acknowledged these results. Electronically Signed   By: Marin Olp M.D.   On: 01/31/2019 11:16    Pending Labs Unresulted Labs (From admission, onward)    Start     Ordered   01/31/19 1521  Protime-INR  Once,   R    Question:  Specimen collection method  Answer:  IV Team=IV Team collect   01/31/19 1520   01/31/19 1309  Blood culture (routine x 2)  BLOOD CULTURE X 2,   STAT     01/31/19 1309   01/31/19 1306  Urinalysis, Routine w reflex microscopic  ONCE - STAT,   STAT     01/31/19 1305   Signed and Held  Basic metabolic panel  Tomorrow morning,   R    Question:  Specimen collection method  Answer:  IV Team=IV Team collect   Signed and Held   Signed and Held  CBC  Tomorrow morning,   R    Question:  Specimen collection method  Answer:  IV Team=IV Team collect   Signed and Held          Vitals/Pain Today's Vitals   01/31/19 1248 01/31/19 1346 01/31/19 1524  BP:  106/77   Pulse:  (!) 104   Temp:  98.8 F (37.1 C) 99.1 F (37.3 C)  TempSrc:  Oral Rectal  SpO2:  95%   PainSc: 6       Isolation Precautions No active isolations  Medications Medications  diltiazem (CARDIZEM CD) 24 hr capsule 120 mg (has no administration in  time range)  insulin aspart (novoLOG) injection 0-9 Units (has no  administration in time range)  piperacillin-tazobactam (ZOSYN) IVPB 3.375 g (has no administration in time range)  piperacillin-tazobactam (ZOSYN) IVPB 3.375 g (0 g Intravenous Stopped 01/31/19 1527)    Mobility walks with device High fall risk   Focused Assessments Cardiac Assessment Handoff:    Lab Results  Component Value Date   TROPONINI <0.03 12/27/2018   No results found for: DDIMER Does the Patient currently have chest pain? No     R Recommendations: See Admitting Provider Note  Report given to:   Additional Notes:   See below  Patient presents to the ED from [facility] with C/O abdominal pain and a CT that was positive for an abdominal abscess.  Patient schedule to go to IR for a drain placement.

## 2019-01-31 NOTE — ED Triage Notes (Signed)
Pt arrives via ems from [facility] after confirmed abdominal abcess on CT today. Pt with abd pain. Colostomy in place. Recent colectomy. 110/70, HR 68, 98% RA, CBG 142.

## 2019-01-31 NOTE — Sedation Documentation (Signed)
Vital signs stable. 

## 2019-01-31 NOTE — ED Notes (Signed)
Patient transported to IR 

## 2019-01-31 NOTE — ED Notes (Signed)
Called 6N to give report.  Receiving RN unable to take report at this time.

## 2019-01-31 NOTE — H&P (Signed)
TRH H&P   Patient Demographics:    Cassandra Allen, is a 73 y.o. female  MRN: [id number]   DOB - 01-Nov-1946  Admit Date - 01/31/2019  Outpatient Primary MD for the patient is [name], MD  Referring MD/NP/PA: [name]  Outpatient Specialists: Oncology [name]  Patient coming from: [facility]  Chief Complaint  Patient presents with  . Abdominal Pain      HPI:    Cassandra Allen  is a 73 y.o. female, has medical history of atrial fibrillation on Eliquis, hypertension, diabetes mellitus, colon cancer, metastatic to the liver, status post exploratory laparotomy with partial colectomy, and transverse end colostomy 12/27/2018, patient discharged to SNF, she presents with complaints of neurolysed weakness, abdominal pain over last week, reports it is intermittent, being on her right side makes it worse, aching quality, no relieving factors, ports nausea, denies nausea, vomiting, reports tolerating oral intake, and her colostomy bag has been filling with no issues, will report generalized weakness, but mostly noted for her left lower extremity. - in ED CT abdomen pelvis significant for intra-abdominal abscess, no leukocytosis, she is afebrile, with normal lactic acid hemoglobin at 8.8, 10 and stable at 0.64, he was started on IV Zosyn, general surgery consulted who recommended drainage by IR.    Review of systems:    In addition to the HPI above,  No Fever-chills, No Headache, No changes with Vision or hearing, No problems swallowing food or Liquids, No Chest pain, Cough or Shortness of Breath, Planes of abdominal pain, No Nausea or Vommitting, Bowel movements are regular, No Blood in stool or Urine, No dysuria, No new skin rashes or bruises, No new joints pains-aches,  Complaints of generalized weakness, mainly in the left lower extremity No recent weight gain  or loss, No polyuria, polydypsia or polyphagia, No significant Mental Stressors.  A full 10 point Review of Systems was done, except as stated above, all other Review of Systems were negative.   With Past History of the following :    Past Medical History:  Diagnosis Date  . Acute diastolic heart failure ([place]) 12/22/2018  . Arthritis   . Atrial fibrillation, chronic 12/22/2018  . Cancer of left colon ([place]) 10/30/2018  . Cancer of sigmoid colon  12/27/2018  . Diabetes mellitus without complication ([place])   . Hypertension   . Obesity (BMI 30-39.9) 12/27/2018      Past Surgical History:  Procedure Laterality Date  . COLONOSCOPY  10/2018   Dr [name].  Large cancer at splenic flexure,  Bulky sigmoid colon mass, Numerous polyps  . IR IMAGING GUIDED PORT INSERTION  11/20/2018  . LAPAROTOMY N/A 12/27/2018   Procedure: LEFT SIGMOID COLECTOMY WITH HARTMANN POUCH AND END COLOSTOMY;  Surgeon: [name], MD;  Location: [facility];  Service: General;  Laterality: N/A;      Social History:     Social History  Tobacco Use  . Smoking status: Never Smoker  . Smokeless tobacco: Never Used  Substance Use Topics  . Alcohol use: No     Lives -currently at [facility] for subacute rehab  Mobility -with assistance     Family History :     Family History  Problem Relation Age of Onset  . Heart attack Mother   . Heart attack Father      Home Medications:   Prior to Admission medications   Medication Sig Start Date End Date Taking? Authorizing Provider  acetaminophen (TYLENOL) 325 MG tablet Take 650 mg by mouth every 4 (four) hours as needed for mild pain. Do not exceed more than 3gm total in 24 hrs   Yes [provider]  apixaban (ELIQUIS) 5 MG TABS tablet TAKE 1 TABLET (5 MG TOTAL) BY MOUTH 2 (TWO) TIMES DAILY. Patient taking differently: Take 5 mg by mouth 2 (two) times daily.  09/19/18  Yes [name], NP  diltiazem (CARDIZEM CD) 120 MG 24 hr capsule Take 1 capsule (120  mg total) by mouth daily. 01/08/19  Yes [name], [name], MD  feeding supplement, ENSURE ENLIVE, (ENSURE ENLIVE) LIQD Ensure Enlive one by mouth daily for supplement   Yes [provider]  ferrous sulfate (KP FERROUS SULFATE) 325 (65 FE) MG tablet Take 325 mg by mouth daily with breakfast.   Yes [provider]  furosemide (LASIX) 20 MG tablet Lasix 20 mg po daily PRN for weight of greater than 3 lbs in a day or 5 lbs in a week Patient taking differently: Take 20 mg by mouth daily as needed (weight gain). For weight gain of greater than 3 lbs in a day or 5 lbs in a week 12/23/18  Yes [name], MD  insulin glargine (LANTUS) 100 UNIT/ML injection Inject 10 Units into the skin at bedtime.   Yes [provider]  Insulin NPH, Human,, Isophane, (NOVOLIN N FLEXPEN) 100 UNIT/ML Kiwkpen Inject 12 Units into the skin 2 (two) times daily. FOR DM   Yes [provider]  Melatonin 3 MG TABS Take 6 mg by mouth at bedtime.    Yes [provider]  NON FORMULARY Med Pass 120 ml po bid   Yes [provider]  NON FORMULARY NSA Med Pass 120 ml po daily   Yes [provider]  ondansetron (ZOFRAN) 8 MG tablet Take 8 mg by mouth every 8 (eight) hours as needed for nausea.   Yes [provider]  oxybutynin (DITROPAN-XL) 10 MG 24 hr tablet Take 10 mg by mouth daily. 12/16/18  Yes [provider]  sertraline (ZOLOFT) 100 MG tablet Take 100 mg by mouth daily.    Yes [provider]  simvastatin (ZOCOR) 20 MG tablet Take 20 mg by mouth at bedtime.   Yes [provider]  traMADol (ULTRAM) 50 MG tablet Take 1 tablet (50 mg total) by mouth daily as needed. Take one tab a day prn pain--do NOT take within 12 hours of Zoloft administration!! Patient taking differently: Take 50 mg by mouth daily as needed for moderate pain. Take one tab a day as needed for pain--do NOT take within 12 hours of Zoloft administration!! 01/24/19  Yes  Oscar La, Arlo C, PA-C  bisacodyl (DULCOLAX) 10 MG suppository Constipation ( 2 of 4) If not relieved by MOM give 10 mg Bisacodyl suppository rectally x 1 dose in 24 hours as needed ( (Do not use constipation standing orders for residents with renal failure/CFR  less than 30. Contact MD for orders)    [provider]     Allergies:     Allergies  Allergen Reactions  . Metformin And Related Nausea And Vomiting  . Prednisone Nausea And Vomiting     Physical Exam:   Vitals  Blood pressure 106/77, pulse (!) 104, temperature 98.8 F (37.1 C), temperature source Oral, SpO2 95 %.   1. General developed female laying in bed in no apparent distress  2. Normal affect and insight, Not Suicidal or Homicidal, Awake Alert, Oriented X 3.  3. No F.N deficits, ALL C.Nerves Intact, Strength 5/5 all 4 extremities, Sensation intact all 4 extremities, Plantars down going.  4. Ears and Eyes appear Normal, Conjunctivae clear, PERRLA. Moist Oral Mucosa.  5. Supple Neck, No JVD, No cervical lymphadenopathy appriciated, No Carotid Bruits.  6. Symmetrical Chest wall movement, Good air movement bilaterally, CTAB.  Port-A-Cath in left upper chest  7.  Irregular irregular, No Gallops, Rubs or Murmurs, No Parasternal Heave.  8. Positive Bowel Sounds, abdomen mildly distended with generalized tenderness, colostomy bag in right abdomen, with midline surgical scar with some wound dehiscence and superior area, no rebound or rigidity   9.  No Cyanosis, Normal Skin Turgor, No Skin Rash or Bruise.  10. Good muscle tone,  joints appear normal , no effusions, Normal ROM.  11. No Palpable Lymph Nodes in Neck or Axillae     Data Review:    CBC Recent Labs  Lab 01/27/19 01/31/19 1301  WBC 12.3 8.6  HGB 9.4* 8.8*  HCT 29* 28.9*  PLT 348 366  MCV  --  91.5  MCH  --  27.8  MCHC  --  30.4  RDW  --  15.9*  LYMPHSABS  --  0.6*  MONOABS  --  0.9  EOSABS  --  0.0  BASOSABS  --  0.0    ------------------------------------------------------------------------------------------------------------------  Chemistries  Recent Labs  Lab 01/27/19 01/31/19 1301  NA 144 142  K 3.8 4.0  CL 105 107  CO2 25 24  GLUCOSE  --  123*  BUN 18 19  CREATININE 0.6 0.64  CALCIUM 7.8 8.5*  AST  --  11*  ALT  --  15  ALKPHOS  --  234*  BILITOT  --  0.5   ------------------------------------------------------------------------------------------------------------------ estimated creatinine clearance is 61.6 mL/min (by C-G formula based on SCr of 0.64 mg/dL). ------------------------------------------------------------------------------------------------------------------ No results for input(s): TSH, T4TOTAL, T3FREE, THYROIDAB in the last 72 hours.  Invalid input(s): FREET3  Coagulation profile No results for input(s): INR, PROTIME in the last 168 hours. ------------------------------------------------------------------------------------------------------------------- No results for input(s): DDIMER in the last 72 hours. -------------------------------------------------------------------------------------------------------------------  Cardiac Enzymes No results for input(s): CKMB, TROPONINI, MYOGLOBIN in the last 168 hours.  Invalid input(s): CK ------------------------------------------------------------------------------------------------------------------    Component Value Date/Time   BNP 114.3 (H) 12/27/2018 0429     ---------------------------------------------------------------------------------------------------------------  Urinalysis    Component Value Date/Time   COLORURINE YELLOW 01/01/2019 1103   APPEARANCEUR CLEAR 01/01/2019 1103   LABSPEC 1.010 01/01/2019 1103   PHURINE 5.5 01/01/2019 1103   GLUCOSEU NEGATIVE 01/01/2019 1103   HGBUR SMALL (A) 01/01/2019 1103   BILIRUBINUR NEGATIVE 01/01/2019 1103   KETONESUR NEGATIVE 01/01/2019 1103   PROTEINUR NEGATIVE  01/01/2019 1103   UROBILINOGEN 0.2 03/25/2015 2317   NITRITE NEGATIVE 01/01/2019 1103   LEUKOCYTESUR NEGATIVE 01/01/2019 1103    ----------------------------------------------------------------------------------------------------------------   Imaging Results:    Ct Abdomen Pelvis W Contrast  Result Date: 01/31/2019 CLINICAL DATA:  Post  laparoscopic procedure with sigmoid colectomy for colon cancer. Diffuse hepatic masses. Two chemotherapy treatments. EXAM: CT ABDOMEN AND PELVIS WITH CONTRAST TECHNIQUE: Multidetector CT imaging of the abdomen and pelvis was performed using the standard protocol following bolus administration of intravenous contrast. CONTRAST:  141mL ISOVUE-300 IOPAMIDOL (ISOVUE-300) INJECTION 61% COMPARISON:  01/05/2019, 12/26/2018 and 11/01/2018 FINDINGS: Lower chest: Lung bases demonstrate worsening moderate size left effusion with associated basilar atelectasis. Right lung base is clear. Hepatobiliary: Several hypodense masses with the largest over the caudate lobe measuring 2.6 cm without significant change likely metastatic disease. Gallbladder and biliary tree are within normal. Pancreas: Normal. Spleen: Few small stable subcentimeter hypodensities. Adrenals/Urinary Tract: Right adrenal gland is normal. 2.7 x 3.2 cm left adrenal mass without significant change likely metastatic disease. Kidneys are normal size without hydronephrosis or nephrolithiasis. Subcentimeter hypodensity over the upper pole right renal cortex too small to characterize but likely a cyst. Ureters and bladder are normal. Stomach/Bowel: Stomach is within normal. Small bowel is unremarkable. Appendix not visualized. Evidence of patient's previous partial colectomy with ostomy site over the right anterior abdominal wall. Evidence of Hartmann's pouch. Vascular/Lymphatic: Mild calcified plaque over the abdominal aorta. No adenopathy. Reproductive: Unremarkable. Other: There is a large fluid collection containing  multiple foci of air with thin rim enhancement over the central lower abdomen extending into the left mid to upper abdomen measuring approximately 12.6 x 24.6 x 18.4 cm and AP, transverse and craniocaudal dimensions. This likely represents a intra-abdominal abscess. There is an oval area over the omentum in the anterior midline upper abdomen measuring 8.4 x 16.6 cm containing fluid and multiple collections of air likely additional site of infection. There is mild ascites. Small right inguinal hernia containing only peritoneal fat. Musculoskeletal: Degenerative change of the spine and hips. IMPRESSION: Postsurgical change compatible recent partial colectomy for colon cancer with Hartmann's pouch and right abdominal wall colostomy. There is a large rim enhancing fluid collection containing multiple foci of air over the lower abdomen extending into the left mid to upper abdomen measuring 12.6 x 24.6 x 18.4 cm likely an intra-abdominal abscess. There is an oval collection over the anterior midline upper abdomen over the omentum containing fat, fluid and multiple foci of air measuring 8.4 x 16.6 cm likely a second site of infection. Mild ascites. Several liver hypodensities with the largest over the caudate lobe measuring 2.6 cm without significant change likely metastatic disease. 3.2 cm left adrenal mass without significant change likely metastatic disease. Several small splenic hypodensities unchanged and likely benign, although metastatic disease is possible. Worsening moderate size left pleural effusion with associated basilar atelectasis. Subcentimeter right renal cortical hypodensity too small to characterize but likely a cyst. Small right inguinal hernia containing only peritoneal fat. Aortic Atherosclerosis (ICD10-I70.0). These results were called by telephone at the time of interpretation on 01/31/2019 at 11:18 am to Dr. Gurney Maxin, who verbally acknowledged these results. Electronically Signed   By: Marin Olp M.D.   On: 01/31/2019 11:16    My personal review of EKG: Rhythm fib with RVR with heart rate of 131   Assessment & Plan:    Active Problems:   Essential (primary) hypertension   Diabetes mellitus without complication (HCC)   Atrial fibrillation, chronic   Anticoagulant long-term use   Cancer of sigmoid colon    Postoperative intra-abdominal abscess  intra-abdominal abscess -With known history of colon cancer, status post recent exploratory laparotomy with colectomy Hartman's procedure. -Management per general surgery, recommend IR drainage.  Discussed with  IR -Eliquis for now, and will keep on heparin GTT -Continue with IV Zosyn, will narrow once appropriate and more cultures available.  Generalized weakness -Patient reports more significant in left lower extremity, but nothing specific in her physical exam, obtain CT head  Metastatic adenocarcinoma -Colon cancer with known mets to the liver, this is managed by Dr. Annamaria Boots as an outpatient  A. fib with RVR -Heart rate initially controlled in ED in the 130s, she is currently in the 110s, will give her 1 dose of Cardizem currently. -She is on Eliquis for anticoagulation, will keep on heparin GTT currently in anticipation for procedure  Chronic diastolic CHF -Continue with Lasix 20 mg oral daily  Hypertension -Normotensive, continue with Cardizem  Anemia of chronic disease -Hemoglobin at baseline, 8.8, no indication for transfusion  Diabetes mellitus type 2 -Continue with home dose Lantus, and continue with insulin sliding scale   DVT Prophylaxis on Eliquis>> Heparin GTT  AM Labs Ordered, also please review Full Orders  Family Communication: Admission, patients condition and plan of care including tests being ordered have been discussed with the patient who indicate understanding and agree with the plan and Code Status.  Code Status Full  Likely DC to  SNF  Condition GUARDED    Consults called: Gen surgery     Admission status: Inpatgient  Time spent in minutes : 60 minutes   Phillips Climes M.D on 01/31/2019 at 3:18 PM  Between 7am to 7pm - Pager - [phone]. After 7pm go to [email or web address] - password [facility]  [facility] - Office  [phone]

## 2019-01-31 NOTE — Patient Outreach (Signed)
[facility]) Care Management  01/31/2019  Cassandra Allen [facility] 27-Jan-1946 [id number]   Case closure   [facility] RN CM received ADT feed indicating pt 01/31/19 ED visit leading to re admission  Epic notes prior to that indicated pt came was transferred from [place] had received a transition of care referral on 01/28/19 but this patient had not been discharged or transitioned to home. Patient is not meeting program criteria  Plans Case closed Patient is not meeting program criteria Message sent to [facility] hospital liaison   [place] L. Lavina Hamman, RN, BSN, [place] Coordinator Office number [phone] Mobile number [phone]  Main [facility] number [phone] Fax number [phone]

## 2019-01-31 NOTE — ED Provider Notes (Signed)
[facility] EMERGENCY DEPARTMENT Provider Note   CSN: [id number] Arrival date & time:       History   Chief Complaint Chief Complaint  Patient presents with  . Abdominal Pain    HPI Cassandra Allen is a 73 y.o. female with a PMH of atrial fibrillation on Eliquis, CHF, HTN, Diabetes, and cancer of left colon presenting with intermittent bilateral abdominal pain onset few weeks ago. Patient describes pain as an ache and states laying on right side makes pain worse. Patient states nothing makes the pain better. Patient arrived via EMS from [place]. Patient had a colectomy by Dr. Hassell Done on 12/27/2018. Patient had a CT abdomen today and was advised to come to the ER due to an abdominal abscess. Patient has a colostomy. Patient denies fever, chills, cough, congestion, sick exposures, or sick contacts. Patient denies nausea or vomiting.      HPI  Past Medical History:  Diagnosis Date  . Acute diastolic heart failure ([place]) 12/22/2018  . Arthritis   . Atrial fibrillation, chronic 12/22/2018  . Cancer of left colon ([place]) 10/30/2018  . Cancer of sigmoid colon  12/27/2018  . Diabetes mellitus without complication ([place])   . Hypertension   . Obesity (BMI 30-39.9) 12/27/2018    Patient Active Problem List   Diagnosis Date Noted  . Cancer of sigmoid colon  12/27/2018  . History of adenomatous polyp of colon 12/27/2018  . Liver metastases from liver cancer 12/27/2018  . CKD (chronic kidney disease) stage 3, GFR 30-59 ml/min (HCC) 12/27/2018  . Obesity (BMI 30-39.9) 12/27/2018  . Cholelithiasis 12/27/2018  . Choledocholithiasis by [facility] 12/27/2018  . Status post partial colectomy Feb 2020 12/27/2018  . Colonic obstruction due to metastatic colon cancer 12/26/2018  . Anticoagulant long-term use 12/26/2018  . HLD (hyperlipidemia) 12/22/2018  . Acute renal failure superimposed on stage 2 chronic kidney disease ([place]) 12/22/2018  . Atrial fibrillation, chronic 12/22/2018   . Depression 12/22/2018  . Chest pain 12/22/2018  . Port-A-Cath in place 12/04/2018  . Goals of care, counseling/discussion 11/22/2018  . GI bleed 11/02/2018  . Rectal bleed   . Cancer of splenic flexure of colon 10/30/2018  . Iron deficiency anemia 04/04/2018  . 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 Pain in right ankle and joints of right foot 01/10/2018  . Essential (primary) hypertension 03/25/2015  . Diabetes mellitus without complication ([place]) 16/96/7893    Past Surgical History:  Procedure Laterality Date  . COLONOSCOPY  10/2018   Dr [name].  Large cancer at splenic flexure,  Bulky sigmoid colon mass, Numerous polyps  . IR IMAGING GUIDED PORT INSERTION  11/20/2018  . LAPAROTOMY N/A 12/27/2018   Procedure: LEFT SIGMOID COLECTOMY WITH HARTMANN POUCH AND END COLOSTOMY;  Surgeon: [name], MD;  Location: [facility];  Service: General;  Laterality: N/A;     OB History   No obstetric history on file.      Home Medications    Prior to Admission medications   Medication Sig Start Date End Date Taking? Authorizing Provider  acetaminophen (TYLENOL) 325 MG tablet Take 650 mg by mouth every 4 (four) hours as needed.    [provider]  apixaban (ELIQUIS) 5 MG TABS tablet TAKE 1 TABLET (5 MG TOTAL) BY MOUTH 2 (TWO) TIMES DAILY. 09/19/18   [name], NP  bisacodyl (DULCOLAX) 10 MG suppository Constipation ( 2 of 4) If not relieved by MOM give 10 mg Bisacodyl suppository  rectally x 1 dose in 24 hours as needed ( (Do not use constipation standing orders for residents with renal failure/CFR less than 30. Contact MD for orders)    [provider]  diltiazem (CARDIZEM CD) 120 MG 24 hr capsule Take 1 capsule (120 mg total) by mouth daily. 01/08/19   Terrilee Croak, MD  feeding supplement, ENSURE ENLIVE, (ENSURE ENLIVE) LIQD Ensure Enlive one by mouth daily for supplement    [provider]  ferrous  sulfate (KP FERROUS SULFATE) 325 (65 FE) MG tablet Take 325 mg by mouth daily with breakfast.    [provider]  furosemide (LASIX) 20 MG tablet Lasix 20 mg po daily PRN for weight of greater than 3 lbs in a day or 5 lbs in a week 12/23/18   [name], MD  Insulin NPH, Human,, Isophane, (NOVOLIN N FLEXPEN) 100 UNIT/ML Kiwkpen Inject 12 Units into the skin 2 (two) times daily. FOR DM    [provider]  magnesium hydroxide (MILK OF MAGNESIA) 400 MG/5ML suspension Constipation(1 of 4) If no BM in 3 days give 30 cc Milk of Magnesium  p.o. x 1 dose in 24 hours as needed ( Do not use standing constipation orders for residents with renal failure CFR less than 30. Contact MD for orders)    [provider]  Melatonin 3 MG TABS Take 6 mg by mouth at bedtime.     [provider]  NON FORMULARY Med Pass 120 ml po bid    [provider]  NON FORMULARY NSA Med Pass 120 ml po daily    [provider]  ondansetron (ZOFRAN) 8 MG tablet Take 8 mg by mouth every 8 (eight) hours as needed for nausea.    [provider]  oxybutynin (DITROPAN-XL) 10 MG 24 hr tablet Take 10 mg by mouth daily. 12/16/18   [provider]  sertraline (ZOLOFT) 100 MG tablet Take 100 mg by mouth daily.     [provider]  simvastatin (ZOCOR) 20 MG tablet Take 20 mg by mouth at bedtime.    [provider]  Sodium Phosphates (RA SALINE ENEMA RE) Constipation ( 3 of 4 ) If not relieved by Bisacodyl suppository give disposable Saline Enema rectally x 1 dose /24 hrs as needed   (Do not use constipation standing orders for residents with renal failure/CFR less than 30. Contact MD for orders)    Constipation (4 of 4): Contact MD as needed if no results from enema (Nursing Measure)    [provider]  traMADol (ULTRAM) 50 MG tablet Take 1 tablet (50 mg total) by mouth daily as needed. Take one tab a day prn pain--do NOT take within 12 hours of  Zoloft administration!! 01/24/19   Wille Celeste, PA-C    Family History Family History  Problem Relation Age of Onset  . Heart attack Mother   . Heart attack Father     Social History Social History   Tobacco Use  . Smoking status: Never Smoker  . Smokeless tobacco: Never Used  Substance Use Topics  . Alcohol use: No  . Drug use: No    Allergies   Metformin and related and Prednisone   Review of Systems Review of Systems  Constitutional: Negative for activity change, appetite change, chills, diaphoresis, fatigue, fever and unexpected weight change.  HENT: Negative for congestion, rhinorrhea and sore throat.   Eyes: Negative for visual disturbance.  Respiratory: Negative for cough and shortness of breath.  Cardiovascular: Negative for chest pain.  Gastrointestinal: Positive for abdominal pain. Negative for nausea and vomiting.  Endocrine: Negative for polydipsia, polyphagia and polyuria.  Genitourinary: Negative for dysuria, flank pain and frequency.  Musculoskeletal: Negative for back pain.  Skin: Positive for wound.  Neurological: Negative for dizziness and weakness.  Psychiatric/Behavioral: The patient is not nervous/anxious.      Physical Exam Updated Vital Signs BP 106/77 (BP Location: Left Arm)   Pulse (!) 104   Temp 98.8 F (37.1 C) (Oral)   SpO2 95%   Physical Exam Vitals signs and nursing note reviewed.  Constitutional:      General: She is not in acute distress.    Appearance: She is well-developed. She is not diaphoretic.  HENT:     Head: Normocephalic and atraumatic.  Neck:     Musculoskeletal: Normal range of motion and neck supple.  Cardiovascular:     Rate and Rhythm: Normal rate and regular rhythm.     Heart sounds: Normal heart sounds. No murmur. No friction rub. No gallop.   Pulmonary:     Effort: Pulmonary effort is normal. No respiratory distress.     Breath sounds: Normal breath sounds. No wheezing or rales.  Abdominal:      General: Abdomen is protuberant. A surgical scar is present. Bowel sounds are normal. There is distension.     Palpations: Abdomen is soft. Abdomen is not rigid. There is no mass.     Tenderness: There is generalized abdominal tenderness. There is no right CVA tenderness, left CVA tenderness, guarding or rebound.     Hernia: No hernia is present.  Musculoskeletal: Normal range of motion.  Skin:    General: Skin is warm.     Findings: Wound present. No rash.  Neurological:     Mental Status: She is alert and oriented to person, place, and time.        ED Treatments / Results  Labs (all labs ordered are listed, but only abnormal results are displayed) Labs Reviewed  COMPREHENSIVE METABOLIC PANEL - Abnormal; Notable for the following components:      Result Value   Glucose, Bld 123 (*)    Calcium 8.5 (*)    Total Protein 5.3 (*)    Albumin 1.6 (*)    AST 11 (*)    Alkaline Phosphatase 234 (*)    All other components within normal limits  CBC WITH DIFFERENTIAL/PLATELET - Abnormal; Notable for the following components:   RBC 3.16 (*)    Hemoglobin 8.8 (*)    HCT 28.9 (*)    RDW 15.9 (*)    Lymphs Abs 0.6 (*)    All other components within normal limits  CULTURE, BLOOD (ROUTINE X 2)  CULTURE, BLOOD (ROUTINE X 2)  LACTIC ACID, PLASMA  URINALYSIS, ROUTINE W REFLEX MICROSCOPIC  LACTIC ACID, PLASMA    EKG None  Radiology Ct Abdomen Pelvis W Contrast  Result Date: 01/31/2019 CLINICAL DATA:  Post laparoscopic procedure with sigmoid colectomy for colon cancer. Diffuse hepatic masses. Two chemotherapy treatments. EXAM: CT ABDOMEN AND PELVIS WITH CONTRAST TECHNIQUE: Multidetector CT imaging of the abdomen and pelvis was performed using the standard protocol following bolus administration of intravenous contrast. CONTRAST:  125mL ISOVUE-300 IOPAMIDOL (ISOVUE-300) INJECTION 61% COMPARISON:  01/05/2019, 12/26/2018 and 11/01/2018 FINDINGS: Lower chest: Lung bases demonstrate worsening  moderate size left effusion with associated basilar atelectasis. Right lung base is clear. Hepatobiliary: Several hypodense masses with the largest over the caudate lobe measuring 2.6 cm without significant  change likely metastatic disease. Gallbladder and biliary tree are within normal. Pancreas: Normal. Spleen: Few small stable subcentimeter hypodensities. Adrenals/Urinary Tract: Right adrenal gland is normal. 2.7 x 3.2 cm left adrenal mass without significant change likely metastatic disease. Kidneys are normal size without hydronephrosis or nephrolithiasis. Subcentimeter hypodensity over the upper pole right renal cortex too small to characterize but likely a cyst. Ureters and bladder are normal. Stomach/Bowel: Stomach is within normal. Small bowel is unremarkable. Appendix not visualized. Evidence of patient's previous partial colectomy with ostomy site over the right anterior abdominal wall. Evidence of Hartmann's pouch. Vascular/Lymphatic: Mild calcified plaque over the abdominal aorta. No adenopathy. Reproductive: Unremarkable. Other: There is a large fluid collection containing multiple foci of air with thin rim enhancement over the central lower abdomen extending into the left mid to upper abdomen measuring approximately 12.6 x 24.6 x 18.4 cm and AP, transverse and craniocaudal dimensions. This likely represents a intra-abdominal abscess. There is an oval area over the omentum in the anterior midline upper abdomen measuring 8.4 x 16.6 cm containing fluid and multiple collections of air likely additional site of infection. There is mild ascites. Small right inguinal hernia containing only peritoneal fat. Musculoskeletal: Degenerative change of the spine and hips. IMPRESSION: Postsurgical change compatible recent partial colectomy for colon cancer with Hartmann's pouch and right abdominal wall colostomy. There is a large rim enhancing fluid collection containing multiple foci of air over the lower abdomen  extending into the left mid to upper abdomen measuring 12.6 x 24.6 x 18.4 cm likely an intra-abdominal abscess. There is an oval collection over the anterior midline upper abdomen over the omentum containing fat, fluid and multiple foci of air measuring 8.4 x 16.6 cm likely a second site of infection. Mild ascites. Several liver hypodensities with the largest over the caudate lobe measuring 2.6 cm without significant change likely metastatic disease. 3.2 cm left adrenal mass without significant change likely metastatic disease. Several small splenic hypodensities unchanged and likely benign, although metastatic disease is possible. Worsening moderate size left pleural effusion with associated basilar atelectasis. Subcentimeter right renal cortical hypodensity too small to characterize but likely a cyst. Small right inguinal hernia containing only peritoneal fat. Aortic Atherosclerosis (ICD10-I70.0). These results were called by telephone at the time of interpretation on 01/31/2019 at 11:18 am to Dr. Gurney Maxin, who verbally acknowledged these results. Electronically Signed   By: Marin Olp M.D.   On: 01/31/2019 11:16    Procedures Procedures (including critical care time)  Medications Ordered in ED Medications  piperacillin-tazobactam (ZOSYN) IVPB 3.375 g (3.375 g Intravenous New Bag/Given 01/31/19 1451)  piperacillin-tazobactam (ZOSYN) IVPB 3.375 g (3.375 g Intravenous Not Given 01/31/19 1452)     Initial Impression / Assessment and Plan / ED Course  I have reviewed the triage vital signs and the nursing notes.  Pertinent labs & imaging results that were available during my care of the patient were reviewed by me and considered in my medical decision making (see chart for details).  Clinical Course as of Jan 30 1502  Fri Jan 31, 2019  1431 WBCs are within normal limits.  WBC: 8.6 [AH]    Clinical Course User Index [AH] Arville Lime, PA-C      Patient presents with abdominal pain  after a colectomy. CT abdomen today reveals a large rim enhancing fluid collection containing multiple foci of air over the lower abdomen extending into the left mid to upper abdomen measuring 12.6 x 24.6 x 18.4 cm likely an  intra-abdominal abscess. Second site of infection may be present over the anterior midline upper abdomen over the omentum containing fat, fluid and multiple foci of air measuring 8.4 x 16.6 cm. Mild ascites also noted. WBCs are within normal limits. Patient is afebrile in no acute distress. Started IV antibiotics. Will consult surgery. Surgery recommends admission and drainage by IR. Consulted hospitalist. Hospitalist has agreed to admit patient.   Findings and plan of care discussed with supervising physician Dr. Vanita Panda who personally evaluated and examined this patient.  Final Clinical Impressions(s) / ED Diagnoses   Final diagnoses:  Lower abdominal pain  Postprocedural intraabdominal abscess    ED Discharge Orders    None       Arville Lime, [place] 01/31/19 1506    Carmin Muskrat, MD 02/01/19 [phone]

## 2019-01-31 NOTE — ED Notes (Signed)
Patient transported to CT 

## 2019-01-31 NOTE — Consult Note (Signed)
Chief Complaint: Patient was seen in consultation today for intra-abdominal fluid collection  Referring Physician(s): Dr. Waldron Labs  Supervising Physician: Corrie Mckusick  Patient Status: [facility] - ED  History of Present Illness: Cassandra Allen is a 73 y.o. female with past medical history of colon cancer s/p resection 12/27/18. She has been residing in a SNF since discharge for rehabilitation.  She presented to outside imaging center for routine scan this AM and was found to have a large intra-abdominal fluid collection. Patient was sent to the ED for further evaluation.   Patient is found resting comfortably in bed.  She denies abdominal pain unless lying flat.  She has no leukocytosis, is afebrile, and with a normal lactic acid.   IR consulted for aspiration and drainage of intra-abdominal fluid collection.  She does take Eliquis for a fib.  Her last dose was 1030 this AM.  She has been NPO since arrival to the ED.   Past Medical History:  Diagnosis Date   Acute diastolic heart failure ([place]) 12/22/2018   Arthritis    Atrial fibrillation, chronic 12/22/2018   Cancer of left colon ([place]) 10/30/2018   Cancer of sigmoid colon  12/27/2018   Diabetes mellitus without complication ([place])    Hypertension    Obesity (BMI 30-39.9) 12/27/2018    Past Surgical History:  Procedure Laterality Date   COLONOSCOPY  10/2018   Dr [name].  Large cancer at splenic flexure,  Bulky sigmoid colon mass, Numerous polyps   IR IMAGING GUIDED PORT INSERTION  11/20/2018   LAPAROTOMY N/A 12/27/2018   Procedure: LEFT SIGMOID COLECTOMY WITH HARTMANN POUCH AND END COLOSTOMY;  Surgeon: [name], MD;  Location: [facility];  Service: General;  Laterality: N/A;    Allergies: Metformin and related and Prednisone  Medications: Prior to Admission medications   Medication Sig Start Date End Date Taking? Authorizing Provider  acetaminophen (TYLENOL) 325 MG tablet Take 650 mg by mouth every 4 (four)  hours as needed for mild pain. Do not exceed more than 3gm total in 24 hrs   Yes [provider]  apixaban (ELIQUIS) 5 MG TABS tablet TAKE 1 TABLET (5 MG TOTAL) BY MOUTH 2 (TWO) TIMES DAILY. Patient taking differently: Take 5 mg by mouth 2 (two) times daily.  09/19/18  Yes [name], NP  diltiazem (CARDIZEM CD) 120 MG 24 hr capsule Take 1 capsule (120 mg total) by mouth daily. 01/08/19  Yes [name], [name], MD  feeding supplement, ENSURE ENLIVE, (ENSURE ENLIVE) LIQD Ensure Enlive one by mouth daily for supplement   Yes [provider]  ferrous sulfate (KP FERROUS SULFATE) 325 (65 FE) MG tablet Take 325 mg by mouth daily with breakfast.   Yes [provider]  furosemide (LASIX) 20 MG tablet Lasix 20 mg po daily PRN for weight of greater than 3 lbs in a day or 5 lbs in a week Patient taking differently: Take 20 mg by mouth daily as needed (weight gain). For weight gain of greater than 3 lbs in a day or 5 lbs in a week 12/23/18  Yes [name], MD  insulin glargine (LANTUS) 100 UNIT/ML injection Inject 10 Units into the skin at bedtime.   Yes [provider]  Insulin NPH, Human,, Isophane, (NOVOLIN N FLEXPEN) 100 UNIT/ML Kiwkpen Inject 12 Units into the skin 2 (two) times daily. FOR DM   Yes [provider]  Melatonin 3 MG TABS Take 6 mg by mouth at bedtime.    Yes [provider]  NON FORMULARY Med Pass 120 ml po bid   Yes [provider]  NON FORMULARY NSA Med Pass 120 ml po daily   Yes [provider]  ondansetron (ZOFRAN) 8 MG tablet Take 8 mg by mouth every 8 (eight) hours as needed for nausea.   Yes [provider]  oxybutynin (DITROPAN-XL) 10 MG 24 hr tablet Take 10 mg by mouth daily. 12/16/18  Yes [provider]  sertraline (ZOLOFT) 100 MG tablet Take 100 mg by mouth daily.    Yes [provider]  simvastatin (ZOCOR) 20 MG tablet Take 20 mg by mouth at bedtime.   Yes [provider]  traMADol (ULTRAM) 50 MG tablet Take 1 tablet (50 mg total) by mouth daily as needed. Take one tab a day prn pain--do NOT take within 12 hours of Zoloft administration!! Patient taking differently: Take 50 mg by mouth daily as needed for moderate pain. Take one tab a day as needed for pain--do NOT take within 12 hours of Zoloft administration!! 01/24/19  Yes Oscar La, Arlo C, PA-C  bisacodyl (DULCOLAX) 10 MG suppository Constipation ( 2 of 4) If not relieved by MOM give 10 mg Bisacodyl suppository rectally x 1 dose in 24 hours as needed ( (Do not use constipation standing orders for residents with renal failure/CFR less than 30. Contact MD for orders)    [provider]     Family History  Problem Relation Age of Onset   Heart attack Mother    Heart attack Father     Social History   Socioeconomic History   Marital status: Divorced    Spouse name: Not on file   Number of children: 2   Years of education: Not on file   Highest education level: Not on file  Occupational History   Not on file  Social Needs   Financial resource strain: Not on file   Food insecurity:    Worry: Not on file    Inability: Not on file   Transportation needs:    Medical: Not on file    Non-medical: Not on file  Tobacco Use   Smoking status: Never Smoker   Smokeless tobacco: Never Used  Substance and Sexual Activity   Alcohol use: No   Drug use: No   Sexual activity: Not on file  Lifestyle   Physical activity:    Days per week: Not on file    Minutes per session: Not on file   Stress: Not on file  Relationships   Social connections:    Talks on phone: Not on file    Gets together: Not on file    Attends religious service: Not on file    Active member of club or organization: Not on file    Attends meetings of clubs or organizations: Not on file    Relationship status: Not on file  Other Topics Concern   Not on file  Social History Narrative   Not on file      Review of Systems: A 12 point ROS discussed and pertinent positives are indicated in the HPI above.  All other systems are negative.  Review of Systems  Constitutional: Negative for fatigue and fever.  Respiratory: Negative for cough and shortness of breath.   Cardiovascular: Negative for chest pain.  Gastrointestinal: Positive for abdominal distention and abdominal pain. Negative for constipation, diarrhea, nausea and vomiting.  Genitourinary: Negative for dysuria.  Musculoskeletal: Negative for back pain.  Psychiatric/Behavioral: Negative for  behavioral problems and confusion.    Vital Signs: BP 106/77 (BP Location: Left Arm)    Pulse (!) 104    Temp 99.1 F (37.3 C) (Rectal)    SpO2 95%   Physical Exam Vitals signs and nursing note reviewed.  Constitutional:      General: She is not in acute distress.    Appearance: She is well-developed.  HENT:     Mouth/Throat:     Mouth: Mucous membranes are moist.     Pharynx: Oropharynx is clear.  Cardiovascular:     Rate and Rhythm: Tachycardia present. Rhythm irregular.     Heart sounds: Normal heart sounds.     Comments: A fib Pulmonary:     Effort: Pulmonary effort is normal. No respiratory distress.     Breath sounds: Normal breath sounds.  Abdominal:     General: There is distension.     Tenderness: There is no abdominal tenderness.  Skin:    General: Skin is warm and dry.  Neurological:     General: No focal deficit present.     Mental Status: She is alert and oriented to person, place, and time.  Psychiatric:        Mood and Affect: Mood normal.        Behavior: Behavior normal.      MD Evaluation Airway: WNL Heart: WNL Abdomen: WNL Chest/ Lungs: WNL ASA  Classification: 3 Mallampati/Airway Score: One   Imaging: Ct Head Wo Contrast  Result Date: 01/31/2019 CLINICAL DATA:  Muscle weakness, left lower extremity weakness EXAM: CT HEAD WITHOUT CONTRAST TECHNIQUE: Contiguous axial images were obtained from  the base of the skull through the vertex without intravenous contrast. COMPARISON:  None. FINDINGS: Brain: No acute territorial infarction, hemorrhage or intracranial mass. Mild atrophy. Mild small vessel ischemic changes of the white matter. Vascular: No hyperdense vessels.  Carotid vascular calcification. Skull: Normal. Negative for fracture or focal lesion. Sinuses/Orbits: No acute finding. Other: None IMPRESSION: 1. No CT evidence for acute intracranial abnormality. 2. Atrophy and mild small vessel ischemic changes of the white matter Electronically Signed   By: Donavan Foil M.D.   On: 01/31/2019 15:54   Ct Abdomen Pelvis W Contrast  Result Date: 01/31/2019 CLINICAL DATA:  Post laparoscopic procedure with sigmoid colectomy for colon cancer. Diffuse hepatic masses. Two chemotherapy treatments. EXAM: CT ABDOMEN AND PELVIS WITH CONTRAST TECHNIQUE: Multidetector CT imaging of the abdomen and pelvis was performed using the standard protocol following bolus administration of intravenous contrast. CONTRAST:  164mL ISOVUE-300 IOPAMIDOL (ISOVUE-300) INJECTION 61% COMPARISON:  01/05/2019, 12/26/2018 and 11/01/2018 FINDINGS: Lower chest: Lung bases demonstrate worsening moderate size left effusion with associated basilar atelectasis. Right lung base is clear. Hepatobiliary: Several hypodense masses with the largest over the caudate lobe measuring 2.6 cm without significant change likely metastatic disease. Gallbladder and biliary tree are within normal. Pancreas: Normal. Spleen: Few small stable subcentimeter hypodensities. Adrenals/Urinary Tract: Right adrenal gland is normal. 2.7 x 3.2 cm left adrenal mass without significant change likely metastatic disease. Kidneys are normal size without hydronephrosis or nephrolithiasis. Subcentimeter hypodensity over the upper pole right renal cortex too small to characterize but likely a cyst. Ureters and bladder are normal. Stomach/Bowel: Stomach is within normal. Small bowel  is unremarkable. Appendix not visualized. Evidence of patient's previous partial colectomy with ostomy site over the right anterior abdominal wall. Evidence of Hartmann's pouch. Vascular/Lymphatic: Mild calcified plaque over the abdominal aorta. No adenopathy. Reproductive: Unremarkable. Other: There is a large fluid collection containing multiple  foci of air with thin rim enhancement over the central lower abdomen extending into the left mid to upper abdomen measuring approximately 12.6 x 24.6 x 18.4 cm and AP, transverse and craniocaudal dimensions. This likely represents a intra-abdominal abscess. There is an oval area over the omentum in the anterior midline upper abdomen measuring 8.4 x 16.6 cm containing fluid and multiple collections of air likely additional site of infection. There is mild ascites. Small right inguinal hernia containing only peritoneal fat. Musculoskeletal: Degenerative change of the spine and hips. IMPRESSION: Postsurgical change compatible recent partial colectomy for colon cancer with Hartmann's pouch and right abdominal wall colostomy. There is a large rim enhancing fluid collection containing multiple foci of air over the lower abdomen extending into the left mid to upper abdomen measuring 12.6 x 24.6 x 18.4 cm likely an intra-abdominal abscess. There is an oval collection over the anterior midline upper abdomen over the omentum containing fat, fluid and multiple foci of air measuring 8.4 x 16.6 cm likely a second site of infection. Mild ascites. Several liver hypodensities with the largest over the caudate lobe measuring 2.6 cm without significant change likely metastatic disease. 3.2 cm left adrenal mass without significant change likely metastatic disease. Several small splenic hypodensities unchanged and likely benign, although metastatic disease is possible. Worsening moderate size left pleural effusion with associated basilar atelectasis. Subcentimeter right renal cortical  hypodensity too small to characterize but likely a cyst. Small right inguinal hernia containing only peritoneal fat. Aortic Atherosclerosis (ICD10-I70.0). These results were called by telephone at the time of interpretation on 01/31/2019 at 11:18 am to Dr. Gurney Maxin, who verbally acknowledged these results. Electronically Signed   By: Marin Olp M.D.   On: 01/31/2019 11:16   Ct Abdomen Pelvis W Contrast  Result Date: 01/05/2019 CLINICAL DATA:  Postop infection. Sigmoid colectomy with Hartmann's pouch and colostomy. EXAM: CT ABDOMEN AND PELVIS WITH CONTRAST TECHNIQUE: Multidetector CT imaging of the abdomen and pelvis was performed using the standard protocol following bolus administration of intravenous contrast. CONTRAST:  178mL OMNIPAQUE IOHEXOL 300 MG/ML  SOLN COMPARISON:  12/26/2018 FINDINGS: Lower chest: Small bilateral pleural effusions. Bibasilar atelectasis. Heart is mildly enlarged. Hepatobiliary: Irregular low-density lesion in the caudate lobe measures up to 3.3 cm compatible with the known metastasis. No biliary ductal dilatation. Possible gallstone noted within the gallbladder. Pancreas: No focal abnormality or ductal dilatation. Spleen: Scattered hypodensities in the spleen, nonspecific. Normal size. Adrenals/Urinary Tract: Left adrenal lesion again noted measuring 3 cm. This is stable. Right adrenal gland is unremarkable. No focal renal abnormality or hydronephrosis. Urinary bladder grossly unremarkable. Stomach/Bowel: Changes of partial colectomy right lower quadrant ostomy noted. Stomach is decompressed. No evidence of bowel obstruction. Vascular/Lymphatic: Aortic atherosclerosis. No enlarged abdominal or pelvic lymph nodes. Reproductive: Uterus and adnexa unremarkable.  No mass. Other: Moderate free fluid in the abdomen and pelvis. Open midline incision noted. Locules of free air throughout the upper abdominal mesentery/omentum with stranding, presumably postoperative. Musculoskeletal:  No acute bony abnormality. IMPRESSION: Postoperative changes from partial colectomy. Right lower quadrant ostomy noted. No evidence of bowel obstruction. There is stranding and locules of gas within the upper abdominal mesentery/omentum, presumably postoperative. Moderate free fluid in the abdomen and pelvis. Small bilateral pleural effusions.  Bibasilar atelectasis. Low-density lesion in the caudate lobe compatible with known metastasis. Scattered hypodensities throughout the spleen, nonspecific. Stable left adrenal nodule/adenoma as seen on prior MRI. Electronically Signed   By: Rolm Baptise M.D.   On: 01/05/2019 15:53  Labs:  CBC: Recent Labs    01/23/19 01/27/19 01/30/19 01/31/19 1301  WBC 14.7 12.3 11.8 8.6  HGB 10.0* 9.4* 9.1* 8.8*  HCT 31* 29* 28* 28.9*  PLT 364 348 361 366    COAGS: Recent Labs    11/20/18 1130 12/22/18 0113 12/27/18 0325 12/28/18 0323 12/29/18 2221 12/30/18 1100  INR 1.19 1.30 2.14 1.95  --   --   APTT  --  29 34  --  111* 97*    BMP: Recent Labs    01/05/19 0356 01/06/19 0352 01/07/19 0419  01/23/19 01/27/19 01/30/19 01/31/19 1301  NA 142 141 142   < > 145 144 142 142  K 4.7 4.3 4.1   < > 4.0 3.8 3.9 4.0  CL 99 100 102   < > 105   105 105 107 107  CO2 32 35* 34*   < > 27   27 25 25 24   GLUCOSE 103* 109* 123*  --   --   --   --  123*  BUN 37* 34* 33*   < > 22* 18 22* 19  CALCIUM 7.7* 7.6* 7.8*   < > 8.5   8.5 7.8 8.0 8.5*  CREATININE 0.96 0.80 0.68   < > 0.5 0.6 0.6 0.64  GFRNONAA 59* >60 >60  --   --   --   --  >60  GFRAA >60 >60 >60  --   --   --   --  >60   < > = values in this interval not displayed.    LIVER FUNCTION TESTS: Recent Labs    12/28/18 0323 12/30/18 0319 12/31/18 0337 01/02/19 0518 01/20/19 01/23/19 01/31/19 1301  BILITOT 0.4 0.9 0.6  --   --   --  0.5  AST 38 21 20  --  21 12* 11*  ALT 25 23 20   --  33 20 15  ALKPHOS 62 87 84  --   --   --  234*  PROT 4.5* 4.4* 4.7*  --  4.8* 5.3*   5.3* 5.3*  ALBUMIN 1.9* 1.5*  1.5* 1.7* 2.2 2.4   2.4 1.6*    TUMOR MARKERS: No results for input(s): AFPTM, CEA, CA199, CHROMGRNA in the last 8760 hours.  Assessment and Plan: Intra-abdominal fluid collection s/p colon resection 12/27/18 Patient presents to ED after CT imaging today shows large fluid collection.  IR consulted for aspiration and drainage.  She has multiple collections which may communicate.  Plan is to place drain(s) into largest collection.  She has been NPO this afternoon.  Her last dose of Eliquis was this AM.  Discussed increased risk of bleeding.  Proceed with drain placement this afternoon due to size of collection with potential to decompensate.   Risks and benefits discussed with the patient including bleeding, infection, damage to adjacent structures, bowel perforation/fistula connection, and sepsis.  All of the patient's questions were answered, patient is agreeable to proceed. Consent signed and in chart.    Thank you for this interesting consult.  I greatly enjoyed meeting Cassandra Allen [facility] and look forward to participating in their care.  A copy of this report was sent to the requesting provider on this date.  Electronically Signed: Docia Barrier, PA 01/31/2019, 4:19 PM   I spent a total of 40 Minutes    in face to face in clinical consultation, greater than 50% of which was counseling/coordinating care for intra-abdominal fluid collection.

## 2019-02-01 DIAGNOSIS — Z794 Long term (current) use of insulin: Secondary | ICD-10-CM

## 2019-02-01 LAB — BASIC METABOLIC PANEL
Anion gap: 11 (ref 5–15)
BUN: 16 mg/dL (ref 8–23)
CO2: 23 mmol/L (ref 22–32)
Calcium: 8.2 mg/dL — ABNORMAL LOW (ref 8.9–10.3)
Chloride: 103 mmol/L (ref 98–111)
Creatinine, Ser: 0.6 mg/dL (ref 0.44–1.00)
GFR calc non Af Amer: >60 mL/min (ref >60)
Glucose, Bld: 110 mg/dL — ABNORMAL HIGH (ref 70–99)
Potassium: 3.9 mmol/L (ref 3.5–5.1)
SODIUM: 137 mmol/L (ref 135–145)

## 2019-02-01 LAB — CBC
HCT: 27.3 % — ABNORMAL LOW (ref 36.0–46.0)
Hemoglobin: 8.4 g/dL — ABNORMAL LOW (ref 12.0–15.0)
MCH: 27.7 pg (ref 26.0–34.0)
MCHC: 30.8 g/dL (ref 30.0–36.0)
MCV: 90.1 fL (ref 80.0–100.0)
Platelets: 327 10*3/uL (ref 150–400)
RBC: 3.03 MIL/uL — AB (ref 3.87–5.11)
RDW: 15.8 % — ABNORMAL HIGH (ref 11.5–15.5)
WBC: 5.9 10*3/uL (ref 4.0–10.5)
nRBC: 0.3 % — ABNORMAL HIGH (ref 0.0–0.2)

## 2019-02-01 LAB — GLUCOSE, CAPILLARY
GLUCOSE-CAPILLARY: 179 mg/dL — AB (ref 70–99)
GLUCOSE-CAPILLARY: 217 mg/dL — AB (ref 70–99)
Glucose-Capillary: 85 mg/dL (ref 70–99)
Glucose-Capillary: 197 mg/dL — ABNORMAL HIGH (ref 70–99)

## 2019-02-01 MED ORDER — APIXABAN 5 MG PO TABS
5.0000 mg | ORAL_TABLET | Freq: Two times a day (BID) | ORAL | Status: DC
  Administered 2019-02-01 – 2019-02-09 (×17): 5 mg via ORAL
  Filled 2019-02-01 (×18): qty 1

## 2019-02-01 NOTE — Discharge Instructions (Addendum)
Information on my medicine - ELIQUIS (apixaban)  This medication education was reviewed with me or my healthcare representative as part of my discharge preparation.  Why was Eliquis prescribed for you? Eliquis was prescribed for you to reduce the risk of a blood clot forming that can cause a stroke if you have a medical condition called atrial fibrillation (a type of irregular heartbeat).  What do You need to know about Eliquis ? Take your Eliquis TWICE DAILY - one tablet in the morning and one tablet in the evening with or without food. If you have difficulty swallowing the tablet whole please discuss with your pharmacist how to take the medication safely.  Take Eliquis exactly as prescribed by your doctor and DO NOT stop taking Eliquis without talking to the doctor who prescribed the medication.  Stopping may increase your risk of developing a stroke.  Refill your prescription before you run out.  After discharge, you should have regular check-up appointments with your healthcare provider that is prescribing your Eliquis.  In the future your dose may need to be changed if your kidney function or weight changes by a significant amount or as you get older.  What do you do if you miss a dose? If you miss a dose, take it as soon as you remember on the same day and resume taking twice daily.  Do not take more than one dose of ELIQUIS at the same time to make up a missed dose.  Important Safety Information A possible side effect of Eliquis is bleeding. You should call your healthcare provider right away if you experience any of the following: ? Bleeding from an injury or your nose that does not stop. ? Unusual colored urine (red or dark brown) or unusual colored stools (red or black). ? Unusual bruising for unknown reasons. ? A serious fall or if you hit your head (even if there is no bleeding).  Some medicines may interact with Eliquis and might increase your risk of bleeding or  clotting while on Eliquis. To help avoid this, consult your healthcare provider or pharmacist prior to using any new prescription or non-prescription medications, including herbals, vitamins, non-steroidal anti-inflammatory drugs (NSAIDs) and supplements.  This website has more information on Eliquis (apixaban): http://www.eliquis.com/eliquis/home    Percutaneous Abscess Drain, Care After This sheet gives you information about how to care for yourself after your procedure. Your health care provider may also give you more specific instructions. If you have problems or questions, contact your health care provider. What can I expect after the procedure? After your procedure, it is common to have:  A small amount of bruising and discomfort in the area where the drainage tube (catheter) was placed.  Sleepiness and fatigue. This should go away after the medicines you were given have worn off. Follow these instructions at home: Incision care  Follow instructions from your health care provider about how to take care of your incision. Make sure you: ? Wash your hands with soap and water before you change your bandage (dressing). If soap and water are not available, use hand sanitizer. ? Change your dressing as told by your health care provider. ? Leave stitches (sutures), skin glue, or adhesive strips in place. These skin closures may need to stay in place for 2 weeks or longer. If adhesive strip edges start to loosen and curl up, you may trim the loose edges. Do not remove adhesive strips completely unless your health care provider tells you to do that.  Check your incision area every day for signs of infection. Check for: ? More redness, swelling, or pain. ? More fluid or blood. ? Warmth. ? Pus or a bad smell. ? Fluid leaking from around your catheter (instead of fluid draining through your catheter). Catheter care   Follow instructions from your health care provider about emptying and  cleaning your catheter and collection bag. You may need to clean the catheter every day so it does not clog.  If directed, write down the following information every time you empty your bag: ? The date and time. ? The amount of drainage. General instructions  Rest at home for 1-2 days after your procedure. Return to your normal activities as told by your health care provider.  Do not take baths, swim, or use a hot tub for 24 hours after your procedure, or until your health care provider says that this is okay.  Take over-the-counter and prescription medicines only as told by your health care provider.  Keep all follow-up visits as told by your health care provider. This is important. Contact a health care provider if:  You have less than 10 mL of drainage a day for 2-3 days in a row, or as directed by your health care provider.  You have more redness, swelling, or pain around your incision area.  You have more fluid or blood coming from your incision area.  Your incision area feels warm to the touch.  You have pus or a bad smell coming from your incision area.  You have fluid leaking from around your catheter (instead of through your catheter).  You have a fever or chills.  You have pain that does not get better with medicine. Get help right away if:  Your catheter comes out.  You suddenly stop having drainage from your catheter.  You suddenly have blood in the fluid that is draining from your catheter.  You become dizzy or you faint.  You develop a rash.  You have nausea or vomiting.  You have difficulty breathing or you feel short of breath.  You develop chest pain.  You have problems with your speech or vision.  You have trouble balancing or moving your arms or legs. Summary  It is common to have a small amount of bruising and discomfort in the area where the drainage tube (catheter) was placed.  You may be directed to record the amount of drainage from the  bag every time you empty it.  Follow instructions from your health care provider about emptying and cleaning your catheter and collection bag. This information is not intended to replace advice given to you by your health care provider. Make sure you discuss any questions you have with your health care provider. Document Released: 03/16/2014 Document Revised: 09/21/2016 Document Reviewed: 09/21/2016 Elsevier Interactive Patient Education  2019 [place], Adult  Colostomy surgery is done to create an opening in the front of the abdomen for stool (feces) to leave the body through an ostomy (stoma). Part of the large intestine is attached to the stoma. A bag, also called a pouch, is fitted over the stoma. Stool and gas will collect in the bag. After surgery, you will need to empty and change your colostomy bag as needed. You will also need to care for your stoma. How to care for the stoma Your stoma should look pink, red, and moist, like the inside of your cheek. Soon after surgery, the stoma may be  swollen, but this swelling will go away within 6 weeks. To care for the stoma:  Keep the skin around the stoma clean and dry.  Use a clean, soft washcloth to gently wash the stoma and the skin around it. Clean using a circular motion, and wipe away from the stoma opening, not toward it. ? Use warm water and only use cleansers recommended by your health care provider. ? Rinse the stoma area with plain water. ? Dry the area around the stoma well.  Use stoma powder or ointment on your skin only as told by your health care provider. Do not use any other powders, gels, wipes, or creams on the skin around the stoma.  Check the stoma area every day for signs of infection. Check for: ? New or worsening redness, swelling, or pain. ? New or increased fluid or blood. ? Pus or warmth.  Measure the stoma opening regularly and record the size. Watch for changes. (It is normal for  the stoma to get smaller as swelling goes away.) Share this information with your health care provider. How to empty the colostomy bag  Empty your bag at bedtime and whenever it is one-third to one-half full. Do not let the bag get more than half-full with stool or gas. The bag could leak if it gets too full. Some colostomy bags have a built-in gas release valve that releases gas often throughout the day. Follow these basic steps: 1. Wash your hands with soap and water. 2. Sit far back on the toilet seat. 3. Put several pieces of toilet paper into the toilet water. This will prevent splashing as you empty stool into the toilet. 4. Remove the clip or the hook-and-loop fastener from the tail end of the bag. 5. Unroll the tail, then empty the stool into the toilet. 6. Clean the tail with toilet paper or a moist towelette. 7. Reroll the tail, and close it with the clip or the hook-and-loop fastener. 8. Wash your hands again. How to change the colostomy bag Change your bag every 3-4 days or as often as told by your health care provider. Also change the bag if it is leaking or separating from the skin, or if your skin around the stoma looks or feels irritated. Irritated skin may be a sign that the bag is leaking. Always have colostomy supplies with you, and follow these basic steps: 1. Wash your hands with soap and water. Have paper towels or tissues nearby to clean any discharge. 2. Remove the old bag and skin barrier. Use your fingers or a warm cloth to gently push the skin away from the barrier. 3. Clean the stoma area with water or with mild soap and water, as directed. Use water to rinse away any soap. 4. Dry the skin. You may use the cool setting on a hair dryer to do this. 5. Use a tracing pattern (template) to cut the skin barrier to the size needed. 6. If you are using a two-piece bag, attach the bag and the skin barrier to each other. Add the barrier ring, if you use one. 7. If directed,  apply stoma powder or skin barrier gel to the skin. 8. Warm the skin barrier with your hands, or blow with a hair dryer for 5-10 seconds. 9. Remove the paper from the adhesive strip of the skin barrier. 10. Press the adhesive strip onto the skin around the stoma. 11. Gently rub the skin barrier onto the skin. This creates heat that helps  the barrier to stick. 12. Apply stoma tape to the edges of the skin barrier, if desired. 59. Wash your hands again. General recommendations  Avoid wearing tight clothes or having anything press directly on your stoma or bag. Change your clothing whenever it is soiled or damp.  You may shower or bathe with the bag on or off. Do not use harsh or oily soaps or lotions. Dry the skin and bag after bathing.  Store all supplies in a cool, dry place. Do not leave supplies in extreme heat because some parts can melt or not stick as well.  Whenever you leave home, take extra clothing and an extra skin barrier and bag with you.  If your bag gets wet, you can dry it with a hair dryer on the cool setting.  To prevent odor, you may put drops of ostomy deodorizer in the bag.  If recommended by your health care provider, put ostomy lubricant inside the bag. This helps stool to slide out of the bag more easily and completely. Contact a health care provider if:  You have new or worsening redness, swelling, or pain around your stoma.  You have new or increased fluid or blood coming from your stoma.  Your stoma feels warm to the touch.  You have pus coming from your stoma.  Your stoma extends in or out farther than normal.  You need to change your bag every day.  You have a fever. Get help right away if:  Your stool is bloody.  You have nausea or you vomit.  You have trouble breathing. Summary  Measure your stoma opening regularly and record the size. Watch for changes.  Empty your bag at bedtime and whenever it is one-third to one-half full. Do not let  the bag get more than half-full with stool or gas.  Change your bag every 3-4 days or as often as told by your health care provider.  Whenever you leave home, take extra clothing and an extra skin barrier and bag with you. This information is not intended to replace advice given to you by your health care provider. Make sure you discuss any questions you have with your health care provider. Document Released: 11/02/2003 Document Revised: 07/05/2018 Document Reviewed: 04/25/2017 Elsevier Interactive Patient Education  [employer].

## 2019-02-01 NOTE — Progress Notes (Signed)
Patient ID: Cassandra Allen, female   DOB: 25-Aug-1946, 73 y.o.   MRN: [id number]     Subjective: Patient complaining of back pain.  Hungry.  Denies abdominal complaints.  Objective: Vital signs in last 24 hours: Temp:  [97.7 F (36.5 C)-99.1 F (37.3 C)] 97.7 F (36.5 C) (03/21 0511) Pulse Rate:  [65-136] 96 (03/21 0511) Resp:  [16-27] 16 (03/21 0511) BP: (106-126)/(65-84) 123/79 (03/21 0511) SpO2:  [93 %-100 %] 98 % (03/21 0511) Last BM Date: 01/31/19  Intake/Output from previous day: 03/20 0701 - 03/21 0700 In: 617 [P.O.:560; IV Piggyback:47] Out: 2790 [Urine:1100; Drains:1540; Stool:150] Intake/Output this shift: No intake/output data recorded.  General appearance: alert, cooperative, moderately obese and Chronically ill-appearing GI: Mild diffuse tenderness.  Ostomy upper abdomen.  Percutaneous drain left abdomen with thin purulent fluid.  Lab Results:  Recent Labs    01/31/19 1301 02/01/19 0303  WBC 8.6 5.9  HGB 8.8* 8.4*  HCT 28.9* 27.3*  PLT 366 327   BMET Recent Labs    01/31/19 1301 02/01/19 0303  NA 142 137  K 4.0 3.9  CL 107 103  CO2 24 23  GLUCOSE 123* 110*  BUN 19 16  CREATININE 0.64 0.60  CALCIUM 8.5* 8.2*     Studies/Results: Ct Head Wo Contrast  Result Date: 01/31/2019 CLINICAL DATA:  Muscle weakness, left lower extremity weakness EXAM: CT HEAD WITHOUT CONTRAST TECHNIQUE: Contiguous axial images were obtained from the base of the skull through the vertex without intravenous contrast. COMPARISON:  None. FINDINGS: Brain: No acute territorial infarction, hemorrhage or intracranial mass. Mild atrophy. Mild small vessel ischemic changes of the white matter. Vascular: No hyperdense vessels.  Carotid vascular calcification. Skull: Normal. Negative for fracture or focal lesion. Sinuses/Orbits: No acute finding. Other: None IMPRESSION: 1. No CT evidence for acute intracranial abnormality. 2. Atrophy and mild small vessel ischemic changes of the  white matter Electronically Signed   By: Donavan Foil M.D.   On: 01/31/2019 15:54   Ct Abdomen Pelvis W Contrast  Result Date: 01/31/2019 CLINICAL DATA:  Post laparoscopic procedure with sigmoid colectomy for colon cancer. Diffuse hepatic masses. Two chemotherapy treatments. EXAM: CT ABDOMEN AND PELVIS WITH CONTRAST TECHNIQUE: Multidetector CT imaging of the abdomen and pelvis was performed using the standard protocol following bolus administration of intravenous contrast. CONTRAST:  154mL ISOVUE-300 IOPAMIDOL (ISOVUE-300) INJECTION 61% COMPARISON:  01/05/2019, 12/26/2018 and 11/01/2018 FINDINGS: Lower chest: Lung bases demonstrate worsening moderate size left effusion with associated basilar atelectasis. Right lung base is clear. Hepatobiliary: Several hypodense masses with the largest over the caudate lobe measuring 2.6 cm without significant change likely metastatic disease. Gallbladder and biliary tree are within normal. Pancreas: Normal. Spleen: Few small stable subcentimeter hypodensities. Adrenals/Urinary Tract: Right adrenal gland is normal. 2.7 x 3.2 cm left adrenal mass without significant change likely metastatic disease. Kidneys are normal size without hydronephrosis or nephrolithiasis. Subcentimeter hypodensity over the upper pole right renal cortex too small to characterize but likely a cyst. Ureters and bladder are normal. Stomach/Bowel: Stomach is within normal. Small bowel is unremarkable. Appendix not visualized. Evidence of patient's previous partial colectomy with ostomy site over the right anterior abdominal wall. Evidence of Hartmann's pouch. Vascular/Lymphatic: Mild calcified plaque over the abdominal aorta. No adenopathy. Reproductive: Unremarkable. Other: There is a large fluid collection containing multiple foci of air with thin rim enhancement over the central lower abdomen extending into the left mid to upper abdomen measuring approximately 12.6 x 24.6 x 18.4 cm and AP, transverse  and craniocaudal dimensions. This likely represents a intra-abdominal abscess. There is an oval area over the omentum in the anterior midline upper abdomen measuring 8.4 x 16.6 cm containing fluid and multiple collections of air likely additional site of infection. There is mild ascites. Small right inguinal hernia containing only peritoneal fat. Musculoskeletal: Degenerative change of the spine and hips. IMPRESSION: Postsurgical change compatible recent partial colectomy for colon cancer with Hartmann's pouch and right abdominal wall colostomy. There is a large rim enhancing fluid collection containing multiple foci of air over the lower abdomen extending into the left mid to upper abdomen measuring 12.6 x 24.6 x 18.4 cm likely an intra-abdominal abscess. There is an oval collection over the anterior midline upper abdomen over the omentum containing fat, fluid and multiple foci of air measuring 8.4 x 16.6 cm likely a second site of infection. Mild ascites. Several liver hypodensities with the largest over the caudate lobe measuring 2.6 cm without significant change likely metastatic disease. 3.2 cm left adrenal mass without significant change likely metastatic disease. Several small splenic hypodensities unchanged and likely benign, although metastatic disease is possible. Worsening moderate size left pleural effusion with associated basilar atelectasis. Subcentimeter right renal cortical hypodensity too small to characterize but likely a cyst. Small right inguinal hernia containing only peritoneal fat. Aortic Atherosclerosis (ICD10-I70.0). These results were called by telephone at the time of interpretation on 01/31/2019 at 11:18 am to Dr. Gurney Maxin, who verbally acknowledged these results. Electronically Signed   By: Marin Olp M.D.   On: 01/31/2019 11:16    Anti-infectives: Anti-infectives (From admission, onward)   Start     Dose/Rate Route Frequency Ordered Stop   01/31/19 2100   piperacillin-tazobactam (ZOSYN) IVPB 3.375 g     3.375 g 12.5 mL/hr over 240 Minutes Intravenous Every 8 hours 01/31/19 1525     01/31/19 1430  piperacillin-tazobactam (ZOSYN) IVPB 3.375 g  Status:  Discontinued     3.375 g 12.5 mL/hr over 240 Minutes Intravenous Every 8 hours 01/31/19 1429 01/31/19 1525   01/31/19 1330  piperacillin-tazobactam (ZOSYN) IVPB 3.375 g     3.375 g 100 mL/hr over 30 Minutes Intravenous  Once 01/31/19 1323 01/31/19 1527      Assessment/Plan: Atrial fibrillation on Eliquis Hypertension Acute on chronic kidney disease stage III Type 2 diabetes Hx depression  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 Hassell Done Large intra-abdominal abscess  -Successful percutaneous drainage yesterday - broad spectrum antibiotics - stoma care   FEN:  Should be able to advance diet ID: [email or web address] 3/20>> cultures pending LEX:[id number] be okay to resume anticoagulation. Follow up: Dr. Hassell Done      LOS: 1 day    Darene Lamer [facility] 02/01/2019

## 2019-02-01 NOTE — Progress Notes (Signed)
ANTICOAGULATION CONSULT NOTE - Initial Consult  Pharmacy Consult:  Eliquis Indication: atrial fibrillation  Allergies  Allergen Reactions  . Metformin And Related Nausea And Vomiting  . Prednisone Nausea And Vomiting    Patient Measurements: Height = 62 inches Weight 78.4 kg  Vital Signs: Temp: 97.7 F (36.5 C) (03/21 0511) Temp Source: Oral (03/21 0511) BP: 123/79 (03/21 0511) Pulse Rate: 96 (03/21 0511)  Labs: Recent Labs    01/30/19 01/31/19 1301 01/31/19 1600 02/01/19 0303  HGB 9.1* 8.8*  --  8.4*  HCT 28* 28.9*  --  27.3*  PLT 361 366  --  327  LABPROT  --   --  27.7*  --   INR  --   --  2.6*  --   CREATININE 0.6 0.64  --  0.60    Estimated Creatinine Clearance: 61.6 mL/min (by C-G formula based on SCr of 0.6 mg/dL).   Medical History: Past Medical History:  Diagnosis Date  . Acute diastolic heart failure ([place]) 12/22/2018  . Arthritis   . Atrial fibrillation, chronic 12/22/2018  . Cancer of left colon ([place]) 10/30/2018  . Cancer of sigmoid colon  12/27/2018  . Diabetes mellitus without complication ([place])   . Hypertension   . Obesity (BMI 30-39.9) 12/27/2018     Assessment: 87 YOM presented with abdominal pain, found to have an abscess now s/p IR drainage.  Pharmacy consulted to resume Eliquis from PTA for history of AFib.  Patient qualifies for full dosing given age < 73, SCr < 1.5 and weight > 60kg.  No bleeding reported.  Goal of Therapy:  Appropriate anticoagulation Monitor platelets by anticoagulation protocol: Yes   Plan:  Eliquis 5mg  PO BID Pharmacy will sign off and follow peripherally.  Thank you for the consult!  Lundon Verdejo D. Mina Marble, PharmD, BCPS, [place] 02/01/2019, 12:11 PM

## 2019-02-01 NOTE — Progress Notes (Signed)
PROGRESS NOTE  STARLINA LAPRE IRS:[id number] DOB: May 14, 1946 DOA: 01/31/2019 PCP: [name], MD  HPI/Recap of past 24 hours:  Denies pain, no fever + drain , + colostomy  Assessment/Plan: Active Problems:   Essential (primary) hypertension   Diabetes mellitus without complication (HCC)   Atrial fibrillation, chronic   Anticoagulant long-term use   Cancer of sigmoid colon    Postoperative intra-abdominal abscess  Intraabdominal abscess S/p IR drain Culture in process General surgery oked to resume anticoagulation  Metastatic adenocarcinoma -Colon cancer with known mets to the liver  Afib/RVR -eliquis held on admission due to anticipating procedure -resume eliquis  -heart rate now controlled, continue cardizem  Chronic diastolic chf Hold Lasix due to poor oral intake  Insulin dependent Diabetes mellitus type 2 -Continue with home dose Lantus, and continue with insulin sliding scale  Normocytic anemia: Likely anemia of chronic disease No overt sign of bleeding    Code Status: full  Family Communication: patient   Disposition Plan: not ready to discharge, need to wait on culture result, need general surgery and IR clearance   Consultants:  General surgery  IR clearance  Procedures: US guided drainage of abdominal abscess.  43F biliary drain.   Antibiotics:  zosyn   Objective: BP 123/79 (BP Location: Left Arm)   Pulse 96   Temp 97.7 F (36.5 C) (Oral)   Resp 16   SpO2 98%   Intake/Output Summary (Last 24 hours) at 02/01/2019 1135 Last data filed at 02/01/2019 1000 Gross per 24 hour  Intake 857.02 ml  Output 2930 ml  Net -2072.98 ml   There were no vitals filed for this visit.  Exam: Patient is examined daily including today on 02/01/2019, exams remain the same as of yesterday except that has changed    General:  Frail, NAD  Cardiovascular: RRR  Respiratory: CTABL  Abdomen: Soft/ND/NT, positive BS, + colostomy, + drain  with purulent material  Musculoskeletal: No Edema  Neuro: alert, orientedx3 , impaired memory   Data Reviewed: Basic Metabolic Panel: Recent Labs  Lab 01/27/19 01/30/19 01/31/19 1301 02/01/19 0303  NA 144 142 142 137  K 3.8 3.9 4.0 3.9  CL 105 107 107 103  CO2 25 25 24 23   GLUCOSE  --   --  123* 110*  BUN 18 22* 19 16  CREATININE 0.6 0.6 0.64 0.60  CALCIUM 7.8 8.0 8.5* 8.2*   Liver Function Tests: Recent Labs  Lab 01/31/19 1301  AST 11*  ALT 15  ALKPHOS 234*  BILITOT 0.5  PROT 5.3*  ALBUMIN 1.6*   No results for input(s): LIPASE, AMYLASE in the last 168 hours. No results for input(s): AMMONIA in the last 168 hours. CBC: Recent Labs  Lab 01/27/19 01/30/19 01/31/19 1301 02/01/19 0303  WBC 12.3 11.8 8.6 5.9  NEUTROABS  --   --  7.0  --   HGB 9.4* 9.1* 8.8* 8.4*  HCT 29* 28* 28.9* 27.3*  MCV  --   --  91.5 90.1  PLT 348 361 366 327   Cardiac Enzymes:   No results for input(s): CKTOTAL, CKMB, CKMBINDEX, TROPONINI in the last 168 hours. BNP (last 3 results) Recent Labs    12/22/18 1322 12/27/18 0429  BNP 388.1* 114.3*    ProBNP (last 3 results) No results for input(s): PROBNP in the last 8760 hours.  CBG: Recent Labs  Lab 01/31/19 2132 02/01/19 0739  GLUCAP 95 85    Recent Results (from the past 240 hour(s))  Blood culture (  routine x 2)     Status: None (Preliminary result)   Collection Time: 01/31/19  1:09 PM  Result Value Ref Range Status   Specimen Description BLOOD RIGHT ARM  Final   Special Requests   Final    BOTTLES DRAWN AEROBIC AND ANAEROBIC Blood Culture adequate volume   Culture   Final    NO GROWTH < 24 HOURS Performed at [facility], [address] [place], [place] [address]    Report Status PENDING  Incomplete  Blood culture (routine x 2)     Status: None (Preliminary result)   Collection Time: 01/31/19  1:14 PM  Result Value Ref Range Status   Specimen Description BLOOD LEFT ANTECUBITAL  Final   Special Requests    Final    BOTTLES DRAWN AEROBIC AND ANAEROBIC Blood Culture adequate volume   Culture   Final    NO GROWTH < 24 HOURS Performed at [facility], [place] [address] [place], [place] [address]    Report Status PENDING  Incomplete  Aerobic/Anaerobic Culture (surgical/deep wound)     Status: None (Preliminary result)   Collection Time: 01/31/19  5:41 PM  Result Value Ref Range Status   Specimen Description ABDOMEN  Final   Special Requests NONE  Final   Gram Stain   Final    ABUNDANT WBC PRESENT,BOTH PMN AND MONONUCLEAR MODERATE GRAM POSITIVE COCCI FEW GRAM VARIABLE ROD Performed at [facility], [place] [address] [place],  [address]    Culture PENDING  Incomplete   Report Status PENDING  Incomplete     Studies: Ct Head Wo Contrast  Result Date: 01/31/2019 CLINICAL DATA:  Muscle weakness, left lower extremity weakness EXAM: CT HEAD WITHOUT CONTRAST TECHNIQUE: Contiguous axial images were obtained from the base of the skull through the vertex without intravenous contrast. COMPARISON:  None. FINDINGS: Brain: No acute territorial infarction, hemorrhage or intracranial mass. Mild atrophy. Mild small vessel ischemic changes of the white matter. Vascular: No hyperdense vessels.  Carotid vascular calcification. Skull: Normal. Negative for fracture or focal lesion. Sinuses/Orbits: No acute finding. Other: None IMPRESSION: 1. No CT evidence for acute intracranial abnormality. 2. Atrophy and mild small vessel ischemic changes of the white matter Electronically Signed   By: Donavan Foil M.D.   On: 01/31/2019 15:54    Scheduled Meds: . diltiazem  120 mg Oral Daily  . feeding supplement (ENSURE ENLIVE)  237 mL Oral Q24H  . ferrous sulfate  325 mg Oral Q breakfast  . furosemide  20 mg Oral Daily  . insulin aspart  0-9 Units Subcutaneous TID WC  . insulin glargine  10 Units Subcutaneous QHS  . Melatonin  6 mg Oral QHS  . oxybutynin  10 mg Oral Daily  . sertraline  100 mg Oral Daily   . simvastatin  20 mg Oral QHS  . sodium chloride flush  5 mL Intracatheter Q8H    Continuous Infusions: . piperacillin-tazobactam (ZOSYN)  IV 3.375 g (02/01/19 0506)     Time spent: 16mins I have personally reviewed and interpreted on  02/01/2019 daily labs,  imagings as discussed above under date review session and assessment and plans.  I reviewed all nursing notes, pharmacy notes, consultant notes,  vitals, pertinent old records  I have discussed plan of care as described above with RN , patient  on 02/01/2019   Florencia Reasons MD, PhD  [facility] Pager [phone]. If 7PM-7AM, please contact night-coverage at [email or web address], password [facility] 02/01/2019, 11:35  AM  LOS: 1 day

## 2019-02-02 LAB — BASIC METABOLIC PANEL
Anion gap: 6 (ref 5–15)
BUN: 14 mg/dL (ref 8–23)
CALCIUM: 7.5 mg/dL — AB (ref 8.9–10.3)
CO2: 26 mmol/L (ref 22–32)
Chloride: 106 mmol/L (ref 98–111)
Creatinine, Ser: 0.79 mg/dL (ref 0.44–1.00)
GFR calc Af Amer: >60 mL/min (ref >60)
Glucose, Bld: 248 mg/dL — ABNORMAL HIGH (ref 70–99)
Potassium: 3.3 mmol/L — ABNORMAL LOW (ref 3.5–5.1)
Sodium: 138 mmol/L (ref 135–145)

## 2019-02-02 LAB — CBC WITH DIFFERENTIAL/PLATELET
Abs Immature Granulocytes: 0.11 10*3/uL — ABNORMAL HIGH (ref 0.00–0.07)
Basophils Absolute: 0 10*3/uL (ref 0.0–0.1)
Basophils Relative: 1 %
Eosinophils Absolute: 0.1 10*3/uL (ref 0.0–0.5)
Eosinophils Relative: 2 %
HCT: 25.9 % — ABNORMAL LOW (ref 36.0–46.0)
Hemoglobin: 8 g/dL — ABNORMAL LOW (ref 12.0–15.0)
Immature Granulocytes: 2 %
LYMPHS ABS: 0.8 10*3/uL (ref 0.7–4.0)
Lymphocytes Relative: 13 %
MCH: 27.9 pg (ref 26.0–34.0)
MCHC: 30.9 g/dL (ref 30.0–36.0)
MCV: 90.2 fL (ref 80.0–100.0)
Monocytes Absolute: 0.7 10*3/uL (ref 0.1–1.0)
Monocytes Relative: 12 %
NRBC: 0.5 % — AB (ref 0.0–0.2)
Neutro Abs: 4.4 10*3/uL (ref 1.7–7.7)
Neutrophils Relative %: 70 %
Platelets: 346 10*3/uL (ref 150–400)
RBC: 2.87 MIL/uL — ABNORMAL LOW (ref 3.87–5.11)
RDW: 15.8 % — ABNORMAL HIGH (ref 11.5–15.5)
WBC: 6.1 10*3/uL (ref 4.0–10.5)

## 2019-02-02 LAB — GLUCOSE, CAPILLARY
GLUCOSE-CAPILLARY: 133 mg/dL — AB (ref 70–99)
Glucose-Capillary: 232 mg/dL — ABNORMAL HIGH (ref 70–99)
Glucose-Capillary: 188 mg/dL — ABNORMAL HIGH (ref 70–99)
Glucose-Capillary: 161 mg/dL — ABNORMAL HIGH (ref 70–99)

## 2019-02-02 LAB — MAGNESIUM: Magnesium: 1.3 mg/dL — ABNORMAL LOW (ref 1.7–2.4)

## 2019-02-02 MED ORDER — INSULIN GLARGINE 100 UNIT/ML ~~LOC~~ SOLN
12.0000 [IU] | Freq: Every day | SUBCUTANEOUS | Status: DC
  Administered 2019-02-02 – 2019-02-07 (×6): 12 [IU] via SUBCUTANEOUS
  Filled 2019-02-02 (×6): qty 0.12

## 2019-02-02 MED ORDER — MAGNESIUM SULFATE 4 GM/100ML IV SOLN
4.0000 g | Freq: Once | INTRAVENOUS | Status: AC
  Administered 2019-02-02: 4 g via INTRAVENOUS
  Filled 2019-02-02: qty 100

## 2019-02-02 MED ORDER — POTASSIUM CHLORIDE CRYS ER 20 MEQ PO TBCR: 40.0000 meq | EXTENDED_RELEASE_TABLET | Freq: Once | ORAL | Status: DC

## 2019-02-02 MED ORDER — POTASSIUM CHLORIDE CRYS ER 20 MEQ PO TBCR
20.0000 meq | EXTENDED_RELEASE_TABLET | Freq: Two times a day (BID) | ORAL | Status: AC
  Administered 2019-02-02 (×2): 20 meq via ORAL
  Filled 2019-02-02 (×2): qty 1

## 2019-02-02 NOTE — Progress Notes (Signed)
Patient ID: Cassandra Allen, female   DOB: 1946/07/27, 73 y.o.   MRN: [id number]     Subjective: Feels better.  Mild occasional left-sided abdominal pain.  Tolerating diet without difficulty.  Objective: Vital signs in last 24 hours: Temp:  [97.5 F (36.4 C)-99 F (37.2 C)] 97.5 F (36.4 C) (03/22 0539) Pulse Rate:  [86-102] 102 (03/22 0539) Resp:  [16-18] 18 (03/22 0539) BP: (109-129)/(60-75) 129/71 (03/22 0539) SpO2:  [96 %-100 %] 96 % (03/22 0539) Last BM Date: 01/31/19  Intake/Output from previous day: 03/21 0701 - 03/22 0700 In: 888 [P.O.:838; IV Piggyback:50] Out: 1990 [Urine:1050; Drains:440; Stool:500] Intake/Output this shift: No intake/output data recorded.  General appearance: alert, cooperative, no distress and moderately obese GI: Mild left-sided abdominal tenderness.  JP with purulent drainage.  Colostomy functioning.  Lab Results:  Recent Labs    02/01/19 0303 02/02/19 0318  WBC 5.9 6.1  HGB 8.4* 8.0*  HCT 27.3* 25.9*  PLT 327 346   BMET Recent Labs    02/01/19 0303 02/02/19 0318  NA 137 138  K 3.9 3.3*  CL 103 106  CO2 23 26  GLUCOSE 110* 248*  BUN 16 14  CREATININE 0.60 0.79  CALCIUM 8.2* 7.5*   Recent Results (from the past 240 hour(s))  Blood culture (routine x 2)     Status: None (Preliminary result)   Collection Time: 01/31/19  1:09 PM  Result Value Ref Range Status   Specimen Description BLOOD RIGHT ARM  Final   Special Requests   Final    BOTTLES DRAWN AEROBIC AND ANAEROBIC Blood Culture adequate volume   Culture   Final    NO GROWTH 2 DAYS Performed at [facility], [place] [address], [place], [place] [address]    Report Status PENDING  Incomplete  Blood culture (routine x 2)     Status: None (Preliminary result)   Collection Time: 01/31/19  1:14 PM  Result Value Ref Range Status   Specimen Description BLOOD LEFT ANTECUBITAL  Final   Special Requests   Final    BOTTLES DRAWN AEROBIC AND ANAEROBIC Blood Culture  adequate volume   Culture   Final    NO GROWTH 2 DAYS Performed at [facility], [place] [address], [place], [place] [address]    Report Status PENDING  Incomplete  Aerobic/Anaerobic Culture (surgical/deep wound)     Status: None (Preliminary result)   Collection Time: 01/31/19  5:41 PM  Result Value Ref Range Status   Specimen Description ABDOMEN  Final   Special Requests NONE  Final   Gram Stain   Final    ABUNDANT WBC PRESENT,BOTH PMN AND MONONUCLEAR MODERATE GRAM POSITIVE COCCI FEW GRAM VARIABLE ROD    Culture   Final    CULTURE REINCUBATED FOR BETTER GROWTH Performed at [facility], [place] [address], [place],  [address]    Report Status PENDING  Incomplete    Studies/Results: Ct Head Wo Contrast  Result Date: 01/31/2019 CLINICAL DATA:  Muscle weakness, left lower extremity weakness EXAM: CT HEAD WITHOUT CONTRAST TECHNIQUE: Contiguous axial images were obtained from the base of the skull through the vertex without intravenous contrast. COMPARISON:  None. FINDINGS: Brain: No acute territorial infarction, hemorrhage or intracranial mass. Mild atrophy. Mild small vessel ischemic changes of the white matter. Vascular: No hyperdense vessels.  Carotid vascular calcification. Skull: Normal. Negative for fracture or focal lesion. Sinuses/Orbits: No acute finding. Other: None IMPRESSION: 1. No CT evidence for acute intracranial  abnormality. 2. Atrophy and mild small vessel ischemic changes of the white matter Electronically Signed   By: Donavan Foil M.D.   On: 01/31/2019 15:54   Ct Abdomen Pelvis W Contrast  Result Date: 01/31/2019 CLINICAL DATA:  Post laparoscopic procedure with sigmoid colectomy for colon cancer. Diffuse hepatic masses. Two chemotherapy treatments. EXAM: CT ABDOMEN AND PELVIS WITH CONTRAST TECHNIQUE: Multidetector CT imaging of the abdomen and pelvis was performed using the standard protocol following bolus administration of intravenous contrast. CONTRAST:   163mL ISOVUE-300 IOPAMIDOL (ISOVUE-300) INJECTION 61% COMPARISON:  01/05/2019, 12/26/2018 and 11/01/2018 FINDINGS: Lower chest: Lung bases demonstrate worsening moderate size left effusion with associated basilar atelectasis. Right lung base is clear. Hepatobiliary: Several hypodense masses with the largest over the caudate lobe measuring 2.6 cm without significant change likely metastatic disease. Gallbladder and biliary tree are within normal. Pancreas: Normal. Spleen: Few small stable subcentimeter hypodensities. Adrenals/Urinary Tract: Right adrenal gland is normal. 2.7 x 3.2 cm left adrenal mass without significant change likely metastatic disease. Kidneys are normal size without hydronephrosis or nephrolithiasis. Subcentimeter hypodensity over the upper pole right renal cortex too small to characterize but likely a cyst. Ureters and bladder are normal. Stomach/Bowel: Stomach is within normal. Small bowel is unremarkable. Appendix not visualized. Evidence of patient's previous partial colectomy with ostomy site over the right anterior abdominal wall. Evidence of Hartmann's pouch. Vascular/Lymphatic: Mild calcified plaque over the abdominal aorta. No adenopathy. Reproductive: Unremarkable. Other: There is a large fluid collection containing multiple foci of air with thin rim enhancement over the central lower abdomen extending into the left mid to upper abdomen measuring approximately 12.6 x 24.6 x 18.4 cm and AP, transverse and craniocaudal dimensions. This likely represents a intra-abdominal abscess. There is an oval area over the omentum in the anterior midline upper abdomen measuring 8.4 x 16.6 cm containing fluid and multiple collections of air likely additional site of infection. There is mild ascites. Small right inguinal hernia containing only peritoneal fat. Musculoskeletal: Degenerative change of the spine and hips. IMPRESSION: Postsurgical change compatible recent partial colectomy for colon cancer  with Hartmann's pouch and right abdominal wall colostomy. There is a large rim enhancing fluid collection containing multiple foci of air over the lower abdomen extending into the left mid to upper abdomen measuring 12.6 x 24.6 x 18.4 cm likely an intra-abdominal abscess. There is an oval collection over the anterior midline upper abdomen over the omentum containing fat, fluid and multiple foci of air measuring 8.4 x 16.6 cm likely a second site of infection. Mild ascites. Several liver hypodensities with the largest over the caudate lobe measuring 2.6 cm without significant change likely metastatic disease. 3.2 cm left adrenal mass without significant change likely metastatic disease. Several small splenic hypodensities unchanged and likely benign, although metastatic disease is possible. Worsening moderate size left pleural effusion with associated basilar atelectasis. Subcentimeter right renal cortical hypodensity too small to characterize but likely a cyst. Small right inguinal hernia containing only peritoneal fat. Aortic Atherosclerosis (ICD10-I70.0). These results were called by telephone at the time of interpretation on 01/31/2019 at 11:18 am to Dr. Gurney Maxin, who verbally acknowledged these results. Electronically Signed   By: Marin Olp M.D.   On: 01/31/2019 11:16    Anti-infectives: Anti-infectives (From admission, onward)   Start     Dose/Rate Route Frequency Ordered Stop   01/31/19 2100  piperacillin-tazobactam (ZOSYN) IVPB 3.375 g     3.375 g 12.5 mL/hr over 240 Minutes Intravenous Every 8 hours 01/31/19 1525  01/31/19 1430  piperacillin-tazobactam (ZOSYN) IVPB 3.375 g  Status:  Discontinued     3.375 g 12.5 mL/hr over 240 Minutes Intravenous Every 8 hours 01/31/19 1429 01/31/19 1525   01/31/19 1330  piperacillin-tazobactam (ZOSYN) IVPB 3.375 g     3.375 g 100 mL/hr over 30 Minutes Intravenous  Once 01/31/19 1323 01/31/19 1527      Assessment/Plan: Atrial fibrillation on  Eliquis Hypertension Acute on chronic kidney disease stage III Type 2 diabetes Hx depression  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 Hassell Done Large intra-abdominal abscess -Successful percutaneous drainage  - broad spectrum antibiotics - stoma care   FEN:  On low carbohydrate diet.  Mild hypokalemia, replace with 2 oral doses ID: zosyn 3/20>> cultures pending SKS:HNGI on Eliquis 5 mg daily Follow up: Dr. Hassell Done     LOS: 2 days    Darene Lamer [facility] 02/02/2019

## 2019-02-02 NOTE — Progress Notes (Addendum)
PROGRESS NOTE  Cassandra Allen ZYS:[id number] DOB: December 22, 1945 DOA: 01/31/2019 PCP: [name], MD  HPI/Recap of past 24 hours:  Denies pain, no fever + drain , + colostomy  She asked when is her colostomy can be reversed   Assessment/Plan: Active Problems:   Essential (primary) hypertension   Insulin dependent diabetes mellitus ([place])   Atrial fibrillation, chronic   Anticoagulant long-term use   Cancer of sigmoid colon    Postoperative intra-abdominal abscess  Intraabdominal abscess S/p IR drain Blood culture no growth, drain Culture in process, on zosyn General surgery oked to resume anticoagulation  Metastatic adenocarcinoma -Colon cancer with known mets to the liver  Afib/RVR -eliquis held on admission due to anticipating procedure -resume eliquis  -heart rate now controlled, continue cardizem  Hypokalemia/hypomagnesemia: Replace k/mag, repeat lab in am  Chronic diastolic chf Hold Lasix due to poor oral intake  Insulin dependent Diabetes mellitus type 2 -am blood glucose elevated, increase lantus from 10units to 12units -continue with insulin sliding scale  Normocytic anemia: Likely anemia of chronic disease No overt sign of bleeding  FTT: needs PT, return to SNF once medically stable    Code Status: full  Family Communication: patient   Disposition Plan: not ready to discharge, need to wait on culture result, need general surgery and IR clearance   Consultants:  General surgery  IR clearance  Procedures: US guided drainage of abdominal abscess.  55F biliary drain.   Antibiotics:  zosyn   Objective: BP 129/71 (BP Location: Right Arm)   Pulse (!) 102   Temp (!) 97.5 F (36.4 C) (Oral)   Resp 18   SpO2 96%   Intake/Output Summary (Last 24 hours) at 02/02/2019 9323 Last data filed at 02/02/2019 0547 Gross per 24 hour  Intake 888 ml  Output 1990 ml  Net -1102 ml   There were no vitals filed for this visit.  Exam:  Patient is examined daily including today on 02/02/2019, exams remain the same as of yesterday except that has changed    General:  Frail, NAD  Cardiovascular: RRR  Respiratory: CTABL  Abdomen: Soft/ND/NT, positive BS, + colostomy, + drain with purulent material  Musculoskeletal: No Edema  Neuro: alert, orientedx3 , impaired memory   Data Reviewed: Basic Metabolic Panel: Recent Labs  Lab 01/27/19 01/30/19 01/31/19 1301 02/01/19 0303 02/02/19 0318  NA 144 142 142 137 138  K 3.8 3.9 4.0 3.9 3.3*  CL 105 107 107 103 106  CO2 25 25 24 23 26   GLUCOSE  --   --  123* 110* 248*  BUN 18 22* 19 16 14   CREATININE 0.6 0.6 0.64 0.60 0.79  CALCIUM 7.8 8.0 8.5* 8.2* 7.5*  MG  --   --   --   --  1.3*   Liver Function Tests: Recent Labs  Lab 01/31/19 1301  AST 11*  ALT 15  ALKPHOS 234*  BILITOT 0.5  PROT 5.3*  ALBUMIN 1.6*   No results for input(s): LIPASE, AMYLASE in the last 168 hours. No results for input(s): AMMONIA in the last 168 hours. CBC: Recent Labs  Lab 01/27/19 01/30/19 01/31/19 1301 02/01/19 0303 02/02/19 0318  WBC 12.3 11.8 8.6 5.9 6.1  NEUTROABS  --   --  7.0  --  4.4  HGB 9.4* 9.1* 8.8* 8.4* 8.0*  HCT 29* 28* 28.9* 27.3* 25.9*  MCV  --   --  91.5 90.1 90.2  PLT 348 361 366 327 346   Cardiac Enzymes:  No results for input(s): CKTOTAL, CKMB, CKMBINDEX, TROPONINI in the last 168 hours. BNP (last 3 results) Recent Labs    12/22/18 1322 12/27/18 0429  BNP 388.1* 114.3*    ProBNP (last 3 results) No results for input(s): PROBNP in the last 8760 hours.  CBG: Recent Labs  Lab 02/01/19 0739 02/01/19 1153 02/01/19 1733 02/01/19 2131 02/02/19 0812  GLUCAP 85 179* 217* 197* 133*    Recent Results (from the past 240 hour(s))  Blood culture (routine x 2)     Status: None (Preliminary result)   Collection Time: 01/31/19  1:09 PM  Result Value Ref Range Status   Specimen Description BLOOD RIGHT ARM  Final   Special Requests   Final    BOTTLES  DRAWN AEROBIC AND ANAEROBIC Blood Culture adequate volume   Culture   Final    NO GROWTH 2 DAYS Performed at [facility], [place] [address], [place], [place] [address]    Report Status PENDING  Incomplete  Blood culture (routine x 2)     Status: None (Preliminary result)   Collection Time: 01/31/19  1:14 PM  Result Value Ref Range Status   Specimen Description BLOOD LEFT ANTECUBITAL  Final   Special Requests   Final    BOTTLES DRAWN AEROBIC AND ANAEROBIC Blood Culture adequate volume   Culture   Final    NO GROWTH 2 DAYS Performed at [facility], [place] [address] [place], [place] [address]    Report Status PENDING  Incomplete  Aerobic/Anaerobic Culture (surgical/deep wound)     Status: None (Preliminary result)   Collection Time: 01/31/19  5:41 PM  Result Value Ref Range Status   Specimen Description ABDOMEN  Final   Special Requests NONE  Final   Gram Stain   Final    ABUNDANT WBC PRESENT,BOTH PMN AND MONONUCLEAR MODERATE GRAM POSITIVE COCCI FEW GRAM VARIABLE ROD    Culture   Final    CULTURE REINCUBATED FOR BETTER GROWTH Performed at [facility], [place] [address], [place], [place] [address]    Report Status PENDING  Incomplete     Studies: No results found.  Scheduled Meds: . apixaban  5 mg Oral BID  . diltiazem  120 mg Oral Daily  . feeding supplement (ENSURE ENLIVE)  237 mL Oral Q24H  . insulin aspart  0-9 Units Subcutaneous TID WC  . insulin glargine  10 Units Subcutaneous QHS  . Melatonin  6 mg Oral QHS  . oxybutynin  10 mg Oral Daily  . potassium chloride  20 mEq Oral BID  . sertraline  100 mg Oral Daily  . simvastatin  20 mg Oral QHS  . sodium chloride flush  5 mL Intracatheter Q8H    Continuous Infusions: . magnesium sulfate 1 - 4 g bolus IVPB    . piperacillin-tazobactam (ZOSYN)  IV 3.375 g (02/02/19 3710)     Time spent: 1mins I have personally reviewed and interpreted on  02/02/2019 daily labs,  imagings as discussed above under  date review session and assessment and plans.  I reviewed all nursing notes, pharmacy notes, consultant notes,  vitals, pertinent old records  I have discussed plan of care as described above with RN , patient  on 02/02/2019   Florencia Reasons MD, PhD  [facility] Pager [phone]. If 7PM-7AM, please contact night-coverage at [email or web address], password [facility] 02/02/2019, 8:22 AM  LOS: 2 days

## 2019-02-02 NOTE — Evaluation (Signed)
Physical Therapy Evaluation Patient Details Name: Cassandra Allen MRN: [id number] DOB: 1946/08/22 Today's Date: 02/02/2019   History of Present Illness  Cassandra Allen  is a 73 y.o. female, has medical history of atrial fibrillation on Eliquis, hypertension, diabetes mellitus, colon cancer, metastatic to the liver, status post exploratory laparotomy with partial colectomy, and transverse end colostomy 12/27/2018, patient discharged to SNF, returned to hospital with weakness, and  intra-abdominal fluid collection abscess, s/p IR for drain 3/21  Clinical Impression   Pt admitted with above diagnosis. Pt currently with functional limitations due to the deficits listed below (see PT Problem List). Was independent with assistvie devices prior to original Feb admission; comes from SNF this admission; presents with weakness, decr functional mobility, decr activity tolerance, significant fear of falling;  REcommend return to sNF to continue her rehab; Pt will benefit from skilled PT to increase their independence and safety with mobility to allow discharge to the venue listed below.       Follow Up Recommendations SNF    Equipment Recommendations  None recommended by PT    Recommendations for Other Services       Precautions / Restrictions Precautions Precautions: Fall Precaution Comments: recent ABD surgery and new colostomy, drain L side      Mobility  Bed Mobility Overal bed mobility: Needs Assistance Bed Mobility: Supine to Sit     Supine to sit: Mod assist;HOB elevated     General bed mobility comments: mod assist to help LEs clear bed; HOB quite elevated, and pt able to push off HOB to help come to unsupported sitting; Mod assist and use of bed pad to reciprocally scoot to EOB  Transfers Overall transfer level: Needs assistance Equipment used: Rolling walker (2 wheeled);None Transfers: Sit to/from [employer] Sit to Stand: Max assist(with RW)   Squat  pivot transfers: Max assist     General transfer comment: First attempted stand to RW, but unable to clear bed secondary to weakness and fear of falling; Then used bed pad for dependent squat pivot transfer, knees blocked for stability  Ambulation/Gait                Stairs            Wheelchair Mobility    Modified Rankin (Stroke Patients Only)       Balance     Sitting balance-Leahy Scale: Fair       Standing balance-Leahy Scale: Poor Standing balance comment: reliant on RW                             Pertinent Vitals/Pain Pain Assessment: 0-10 Pain Score: 5  Pain Location: Abdomen with motion Pain Descriptors / Indicators: Aching;Discomfort;Sore Pain Intervention(s): Monitored during session    Home Living Family/patient expects to be discharged to:: Private residence Living Arrangements: Alone Available Help at Discharge: Family;Available PRN/intermittently(long term pans to go to son's home) Type of Home: House Home Access: Stairs to enter   [employer] of Steps: 2 Home Layout: One level Home Equipment: Cane - single point;Walker - 4 wheels      Prior Function Level of Independence: Independent with assistive device(s)         Comments: cane in apt, rollator in community. Drives. Does her own grocery shopping -- all this prior to admission in feb     Hand Dominance        Extremity/Trunk Assessment   Upper Extremity Assessment Upper  Extremity Assessment: Generalized weakness    Lower Extremity Assessment Lower Extremity Assessment: Generalized weakness       Communication   Communication: No difficulties  Cognition Arousal/Alertness: Awake/alert Behavior During Therapy: WFL for tasks assessed/performed Overall Cognitive Status: Within Functional Limits for tasks assessed                                 General Comments: HIGH anxiety/fear of falling with mobility      General Comments       Exercises     Assessment/Plan    PT Assessment Patient needs continued PT services  PT Problem List Decreased balance;Pain;Decreased mobility;Decreased activity tolerance;Decreased strength;Decreased knowledge of use of DME       PT Treatment Interventions Functional mobility training;Balance training;Patient/family education;Gait training;Therapeutic activities;Therapeutic exercise;Wheelchair mobility training    PT Goals (Current goals can be found in the Care Plan section)  Acute Rehab PT Goals Patient Stated Goal: to get to where she can walk PT Goal Formulation: With patient Time For Goal Achievement: 02/16/19 Potential to Achieve Goals: Good    Frequency Min 2X/week   Barriers to discharge        Co-evaluation               AM-PAC PT "6 Clicks" Mobility  Outcome Measure Help needed turning from your back to your side while in a flat bed without using bedrails?: A Lot Help needed moving from lying on your back to sitting on the side of a flat bed without using bedrails?: A Lot Help needed moving to and from a bed to a chair (including a wheelchair)?: A Lot Help needed standing up from a chair using your arms (e.g., wheelchair or bedside chair)?: Total Help needed to walk in hospital room?: Total Help needed climbing 3-5 steps with a railing? : Total 6 Click Score: 9    End of Session Equipment Utilized During Treatment: Gait belt Activity Tolerance: Patient limited by fatigue Patient left: in chair;with call bell/phone within reach;with chair alarm set Nurse Communication: Mobility status;Need for lift equipment PT Visit Diagnosis: Muscle weakness (generalized) (M62.81);Difficulty in walking, not elsewhere classified (R26.2)    Time: 4431-5400 PT Time Calculation (min) (ACUTE ONLY): 27 min   Charges:   PT Evaluation $PT Eval Moderate Complexity: 1 Mod PT Treatments $Therapeutic Activity: 8-22 mins        Roney Marion, PT  Acute  Rehabilitation Services Pager [phone] Office [place] 02/02/2019, 10:41 AM

## 2019-02-03 ENCOUNTER — Other Ambulatory Visit: Payer: Self-pay | Admitting: Student

## 2019-02-03 LAB — BASIC METABOLIC PANEL
Anion gap: 7 (ref 5–15)
BUN: 17 mg/dL (ref 8–23)
CO2: 26 mmol/L (ref 22–32)
CREATININE: 0.91 mg/dL (ref 0.44–1.00)
Calcium: 7.6 mg/dL — ABNORMAL LOW (ref 8.9–10.3)
Chloride: 104 mmol/L (ref 98–111)
GFR calc Af Amer: >60 mL/min (ref >60)
GFR calc non Af Amer: >60 mL/min (ref >60)
Glucose, Bld: 188 mg/dL — ABNORMAL HIGH (ref 70–99)
Potassium: 4.1 mmol/L (ref 3.5–5.1)
Sodium: 137 mmol/L (ref 135–145)

## 2019-02-03 LAB — CBC
HCT: 27.5 % — ABNORMAL LOW (ref 36.0–46.0)
Hemoglobin: 8 g/dL — ABNORMAL LOW (ref 12.0–15.0)
MCH: 26.6 pg (ref 26.0–34.0)
MCHC: 29.1 g/dL — ABNORMAL LOW (ref 30.0–36.0)
MCV: 91.4 fL (ref 80.0–100.0)
PLATELETS: 362 10*3/uL (ref 150–400)
RBC: 3.01 MIL/uL — ABNORMAL LOW (ref 3.87–5.11)
RDW: 15.9 % — ABNORMAL HIGH (ref 11.5–15.5)
WBC: 7.3 10*3/uL (ref 4.0–10.5)
nRBC: 0.4 % — ABNORMAL HIGH (ref 0.0–0.2)

## 2019-02-03 LAB — GLUCOSE, CAPILLARY
Glucose-Capillary: 108 mg/dL — ABNORMAL HIGH (ref 70–99)
Glucose-Capillary: 164 mg/dL — ABNORMAL HIGH (ref 70–99)
Glucose-Capillary: 198 mg/dL — ABNORMAL HIGH (ref 70–99)
Glucose-Capillary: 202 mg/dL — ABNORMAL HIGH (ref 70–99)

## 2019-02-03 LAB — MAGNESIUM: Magnesium: 2 mg/dL (ref 1.7–2.4)

## 2019-02-03 MED ORDER — FUROSEMIDE 20 MG PO TABS
20.0000 mg | ORAL_TABLET | ORAL | Status: DC
  Administered 2019-02-03: 20 mg via ORAL
  Filled 2019-02-03: qty 1

## 2019-02-03 NOTE — Progress Notes (Signed)
[facility] Progress Note     Subjective: CC-  Sitting up in bed eating breakfast. Tolerating diet without any issues. No n/v. Mild lower abdominal pain at times.  WBC 7.3, VSS.  Objective: Vital signs in last 24 hours: Temp:  [97.4 F (36.3 C)-98.3 F (36.8 C)] 97.4 F (36.3 C) (03/23 0540) Pulse Rate:  [79-99] 79 (03/23 0540) Resp:  [18-22] 18 (03/23 0540) BP: (114-133)/(64-77) 133/77 (03/23 0540) SpO2:  [97 %-100 %] 100 % (03/23 0540) Weight:  [78.4 kg] 78.4 kg (03/23 0035) Last BM Date: 02/02/19  Intake/Output from previous day: 03/22 0701 - 03/23 0700 In: 2008.2 [P.O.:1738; I.V.:5; IV Piggyback:265.2] Out: 345 [Urine:300; Drains:45] Intake/Output this shift: Total I/O In: -  Out: 350 [Urine:250; Stool:100]  PE: Gen:  Alert, NAD, pleasant HEENT: EOM's intact, pupils equal and round Pulm:  effort normal Abd: Soft, NT/ND, +BS, JP with purulent drainage, colostomy functioning Ext:  Calves soft and nontender Skin: no rashes noted, warm and dry  Lab Results:  Recent Labs    02/02/19 0318 02/03/19 0129  WBC 6.1 7.3  HGB 8.0* 8.0*  HCT 25.9* 27.5*  PLT 346 362   BMET Recent Labs    02/02/19 0318 02/03/19 0129  NA 138 137  K 3.3* 4.1  CL 106 104  CO2 26 26  GLUCOSE 248* 188*  BUN 14 17  CREATININE 0.79 0.91  CALCIUM 7.5* 7.6*   PT/INR Recent Labs    01/31/19 1600  LABPROT 27.7*  INR 2.6*   CMP     Component Value Date/Time   NA 137 02/03/2019 0129   NA 142 01/30/2019   K 4.1 02/03/2019 0129   CL 104 02/03/2019 0129   CL 107 01/30/2019   CO2 26 02/03/2019 0129   CO2 25 01/30/2019   GLUCOSE 188 (H) 02/03/2019 0129   BUN 17 02/03/2019 0129   BUN 22 (A) 01/30/2019   CREATININE 0.91 02/03/2019 0129   CREATININE 1.04 (H) 12/18/2018 1029   CALCIUM 7.6 (L) 02/03/2019 0129   CALCIUM 8.0 01/30/2019   PROT 5.3 (L) 01/31/2019 1301   PROT 5.3 (A) 01/23/2019   PROT 5.3 (A) 01/23/2019   ALBUMIN 1.6 (L) 01/31/2019 1301   ALBUMIN  2.4 01/23/2019   ALBUMIN 2.4 01/23/2019   AST 11 (L) 01/31/2019 1301   AST 60 (H) 12/18/2018 1029   ALT 15 01/31/2019 1301   ALT 117 (H) 12/18/2018 1029   ALKPHOS 234 (H) 01/31/2019 1301   BILITOT 0.5 01/31/2019 1301   BILITOT 0.5 12/18/2018 1029   GFRNONAA >60 02/03/2019 0129   GFRNONAA 54 (L) 12/18/2018 1029   GFRAA >60 02/03/2019 0129   GFRAA >60 12/18/2018 1029   Lipase     Component Value Date/Time   LIPASE 24 12/25/2018 2254       Studies/Results: No results found.  Anti-infectives: Anti-infectives (From admission, onward)   Start     Dose/Rate Route Frequency Ordered Stop   01/31/19 2100  piperacillin-tazobactam (ZOSYN) IVPB 3.375 g     3.375 g 12.5 mL/hr over 240 Minutes Intravenous Every 8 hours 01/31/19 1525     01/31/19 1430  piperacillin-tazobactam (ZOSYN) IVPB 3.375 g  Status:  Discontinued     3.375 g 12.5 mL/hr over 240 Minutes Intravenous Every 8 hours 01/31/19 1429 01/31/19 1525   01/31/19 1330  piperacillin-tazobactam (ZOSYN) IVPB 3.375 g     3.375 g 100 mL/hr over 30 Minutes Intravenous  Once 01/31/19 1323 01/31/19 1527  Assessment/Plan Atrial fibrillation on Eliquis Hypertension Acute on chronic kidney disease stage III Type 2 diabetes Hx depression  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 Hassell Done Large intra-abdominal abscess - Successful percutaneous drainage 3/20 - broad spectrum antibiotics - stoma care  FEN: CM diet ID: [id number] 3/20>>cultures pending VOJ:[id number] on Eliquis 5 mg daily Follow up: Dr. Hassell Done  Plan: Electrolyte abnormalities improved today. Continue carb modified diet. Continue drain and follow culture, narrow antibiotics when report is complete. Planning for discharge back to SNF soon.   LOS: 3 days    Wellington [place] , [facility] 02/03/2019, 9:14 AM Pager:  [phone] Mon-Thurs 7:00 am-4:30 pm Fri 7:00 am -11:30 AM Sat-Sun 7:00 am-11:30 am

## 2019-02-03 NOTE — Progress Notes (Signed)
Physical Therapy Treatment Patient Details Name: Cassandra Allen MRN: [id number] DOB: Oct 23, 1946 Today's Date: 02/03/2019    History of Present Illness Cassandra Allen  is a 73 y.o. female, has medical history of atrial fibrillation on Eliquis, hypertension, diabetes mellitus, colon cancer, metastatic to the liver, status post exploratory laparotomy with partial colectomy, and transverse end colostomy 12/27/2018, patient discharged to SNF, returned to hospital with weakness, and  intra-abdominal fluid collection abscess, s/p IR for drain 3/21    PT Comments    Pt is making good progress towards her goals today, however continues to be limited in safe mobility by increased anxiety and fear of falling, as well as decreased strength and balance. Pt currently requires modAx2-maxAx2 for sit>stand transfers with and without RW. Also performed therapeutic exercise. PT will continue to follow acutely. D/c plans remain appropriate.    Follow Up Recommendations  SNF     Equipment Recommendations  None recommended by PT       Precautions / Restrictions Precautions Precautions: Fall Precaution Comments: recent ABD surgery and new colostomy, drain L side    Mobility  Bed Mobility               General bed mobility comments: sitting in recliner on entry  Transfers Overall transfer level: Needs assistance Equipment used: Rolling walker (2 wheeled);None Transfers: Sit to/from [employer] Sit to Stand: Max assist;Mod assist(with RW)         General transfer comment: first attempt unable to achieve due to increased posterior lean on power up due to fear of falling, 2nd attempt requires modAx2 with increased encouragement and use of momentum to come to standing, pt able to stand for 30 sec before sitting back down, unable to achieve standing with RW again despite 2 more attempts. Removed RW and pt able to stand with modAx2 with use of pad to increase posterior  rotation of pelvis        Balance     Sitting balance-Leahy Scale: Fair       Standing balance-Leahy Scale: Poor Standing balance comment: reliant on RW                            Cognition Arousal/Alertness: Awake/alert Behavior During Therapy: WFL for tasks assessed/performed Overall Cognitive Status: Within Functional Limits for tasks assessed                                 General Comments: HIGH anxiety/fear of falling with mobility      Exercises General Exercises - Lower Extremity Long Arc Quad: AROM;Both;5 reps;Seated Other Exercises Other Exercises: rubbing hands down front of both shins to premote anterior lean without so much anxiety of falling        Pertinent Vitals/Pain Pain Assessment: Faces Faces Pain Scale: Hurts little more Pain Location: Abdomen with motion Pain Descriptors / Indicators: Aching;Discomfort;Sore Pain Intervention(s): Limited activity within patient's tolerance;Monitored during session;Repositioned           PT Goals (current goals can now be found in the care plan section) Acute Rehab PT Goals Patient Stated Goal: to get to where she can walk PT Goal Formulation: With patient Time For Goal Achievement: 02/16/19 Potential to Achieve Goals: Good    Frequency    Min 2X/week      PT Plan Current plan remains appropriate       AM-PAC PT "  6 Clicks" Mobility   Outcome Measure  Help needed turning from your back to your side while in a flat bed without using bedrails?: A Lot Help needed moving from lying on your back to sitting on the side of a flat bed without using bedrails?: A Lot Help needed moving to and from a bed to a chair (including a wheelchair)?: A Lot Help needed standing up from a chair using your arms (e.g., wheelchair or bedside chair)?: Total Help needed to walk in hospital room?: Total Help needed climbing 3-5 steps with a railing? : Total 6 Click Score: 9    End of Session  Equipment Utilized During Treatment: Gait belt Activity Tolerance: Patient limited by fatigue Patient left: in chair;with call bell/phone within reach;with chair alarm set Nurse Communication: Mobility status;Need for lift equipment PT Visit Diagnosis: Muscle weakness (generalized) (M62.81);Difficulty in walking, not elsewhere classified (R26.2)     Time: 7225-7505 PT Time Calculation (min) (ACUTE ONLY): 22 min  Charges:  $Therapeutic Activity: 8-22 mins                     Janina Trafton B. Migdalia Dk PT, DPT Acute Rehabilitation Services Pager [phone] Office [phone]    Normal 02/03/2019, 5:25 PM

## 2019-02-03 NOTE — Consult Note (Signed)
   [facility] Inpatient Consult   02/03/2019  Cassandra Allen [facility] [birth year] [id number]  Alert by [facility] RN Telephonic nurse of the patient's hospitalization.  Patient was referred for insurance referral request from her skilled facility for post facility transition of care needs. Patient's chart was briefly reviewed for disposition recommendations.  Will follow for progress and disposition to determine post hospital follow up needs with [facility] care management.   Of note, [facility] Care Management services does not replace or interfere with any services that are arranged by inpatient case management or social work.   For additional questions or referrals please contact:  Natividad Brood, RN BSN [facility] Liaison  [phone] business mobile phone Toll free office [phone]  '

## 2019-02-03 NOTE — TOC Initial Note (Signed)
Transition of Care [facility]) - Initial/Assessment Note    Patient Details  Name: Cassandra Allen MRN: [id number] Date of Birth: Dec 22, 1945  Transition of Care [facility] CM/SW Contact:    Marilu Favre, RN Phone Number: 02/03/2019, 11:23 AM  Clinical Narrative:                  Confirmed with patient is she from [facility] and she plans to return to [place] at discharge Expected Discharge Plan: [place] Barriers to Discharge: Continued Medical Work up   Patient Goals and CMS Choice   CMS Medicare.gov Compare Post Acute Care list provided to:: Patient Choice offered to / list presented to : Patient  Expected Discharge Plan and Services Expected Discharge Plan: [place] In-house Referral: Clinical Social Work   Post Acute Care Choice: [place] Living arrangements for the past 2 months: [place]                          Prior Living Arrangements/Services Living arrangements for the past 2 months: [place] Lives with:: Facility Resident                   Activities of Daily Living Home Assistive Devices/Equipment: Contact lenses, Radio producer (specify quad or straight), Walker (specify type) ADL Screening (condition at time of admission) Patient's cognitive ability adequate to safely complete daily activities?: Yes Is the patient deaf or have difficulty hearing?: No Does the patient have difficulty seeing, even when wearing glasses/contacts?: No Does the patient have difficulty concentrating, remembering, or making decisions?: No Patient able to express need for assistance with ADLs?: Yes Does the patient have difficulty dressing or bathing?: Yes Independently performs ADLs?: No Communication: Independent Dressing (OT): Needs assistance Is this a change from baseline?: Change from baseline, expected to last >3 days Grooming: Needs assistance Is this a change from baseline?: Change from  baseline, expected to last >3 days Feeding: Needs assistance Is this a change from baseline?: Change from baseline, expected to last >3 days Bathing: Needs assistance Is this a change from baseline?: Change from baseline, expected to last >3 days Toileting: Needs assistance Is this a change from baseline?: Change from baseline, expected to last >3days In/Out Bed: Needs assistance Is this a change from baseline?: Change from baseline, expected to last >3 days Walks in Home: Needs assistance Is this a change from baseline?: Change from baseline, expected to last >3 days Does the patient have difficulty walking or climbing stairs?: Yes Weakness of Legs: Both Weakness of Arms/Hands: Both  Permission Sought/Granted                  Emotional Assessment              Admission diagnosis:  Lower abdominal pain [R10.30] Intraabdominal fluid collection [R18.8] Postprocedural intraabdominal abscess [T81.49XA] Patient Active Problem List   Diagnosis Date Noted  . Postoperative intra-abdominal abscess 01/31/2019  . Cancer of sigmoid colon  12/27/2018  . History of adenomatous polyp of colon 12/27/2018  . Liver metastases from liver cancer 12/27/2018  . CKD (chronic kidney disease) stage 3, GFR 30-59 ml/min (HCC) 12/27/2018  . Obesity (BMI 30-39.9) 12/27/2018  . Cholelithiasis 12/27/2018  . Choledocholithiasis by MRCP 12/27/2018  . Status post partial colectomy Feb 2020 12/27/2018  . Colonic obstruction due to metastatic colon cancer 12/26/2018  . Anticoagulant long-term use 12/26/2018  . HLD (hyperlipidemia) 12/22/2018  . Acute  renal failure superimposed on stage 2 chronic kidney disease ([place]) 12/22/2018  . Atrial fibrillation, chronic 12/22/2018  . Depression 12/22/2018  . Chest pain 12/22/2018  . Port-A-Cath in place 12/04/2018  . Goals of care, counseling/discussion 11/22/2018  . GI bleed 11/02/2018  . Rectal bleed   . Cancer of splenic flexure of colon 10/30/2018  .  Iron deficiency anemia 04/04/2018  . 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 Pain in right ankle and joints of right foot 01/10/2018  . Essential (primary) hypertension 03/25/2015  . Insulin dependent diabetes mellitus ([place]) 03/25/2015   PCP:  [name], MD Pharmacy:   [id number] - [place], [address] [place] [address] Phone: [phone] Fax: [phone]     Social Determinants of Health (SDOH) Interventions    Readmission Risk Interventions No flowsheet data found.

## 2019-02-03 NOTE — [place] FL2 (Signed)
[place] MEDICAID FL2 LEVEL OF CARE SCREENING TOOL     IDENTIFICATION  Patient Name: Cassandra Allen Birthdate: 07-25-1946 Sex: female Admission Date (Current Location): 01/31/2019  [facility] and [place] Number:  Herbalist and Address:  [facility]. [facility], [place] [address] [place],  [address]      Provider Number: [id number]  Attending Physician Name and Address:  Florencia Reasons, MD  Relative Name and Phone Number:  [name] Ciancio, [phone]    Current Level of Care: Hospital Recommended Level of Care: [place] Prior Approval Number:    Date Approved/Denied:   PASRR Number: [id number]  Discharge Plan: SNF    Current Diagnoses: Patient Active Problem List   Diagnosis Date Noted  . Postoperative intra-abdominal abscess 01/31/2019  . Cancer of sigmoid colon  12/27/2018  . History of adenomatous polyp of colon 12/27/2018  . Liver metastases from liver cancer 12/27/2018  . CKD (chronic kidney disease) stage 3, GFR 30-59 ml/min (HCC) 12/27/2018  . Obesity (BMI 30-39.9) 12/27/2018  . Cholelithiasis 12/27/2018  . Choledocholithiasis by MRCP 12/27/2018  . Status post partial colectomy Feb 2020 12/27/2018  . Colonic obstruction due to metastatic colon cancer 12/26/2018  . Anticoagulant long-term use 12/26/2018  . HLD (hyperlipidemia) 12/22/2018  . Acute renal failure superimposed on stage 2 chronic kidney disease ([place]) 12/22/2018  . Atrial fibrillation, chronic 12/22/2018  . Depression 12/22/2018  . Chest pain 12/22/2018  . Port-A-Cath in place 12/04/2018  . Goals of care, counseling/discussion 11/22/2018  . GI bleed 11/02/2018  . Rectal bleed   . Cancer of splenic flexure of colon 10/30/2018  . Iron deficiency anemia 04/04/2018  . 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 Pain in right ankle and joints of right foot 01/10/2018  . Essential (primary) hypertension  03/25/2015  . Insulin dependent diabetes mellitus ([place]) 03/25/2015    Orientation RESPIRATION BLADDER Height & Weight     Self, Time, Situation, Place  Normal Continent Weight: 172 lb 12.8 oz (78.4 kg) Height:  5\' 2"  (157.5 cm)  BEHAVIORAL SYMPTOMS/MOOD NEUROLOGICAL BOWEL NUTRITION STATUS      Colostomy Diet  AMBULATORY STATUS COMMUNICATION OF NEEDS Skin   Extensive Assist Verbally Surgical wounds(surgical incision with gauze on abdomen (daily changes), MASD on groin and breast; abrasion on left breast and pelvis)                       Personal Care Assistance Level of Assistance  Bathing, Feeding, Dressing Bathing Assistance: Maximum assistance Feeding assistance: Independent Dressing Assistance: Maximum assistance     Functional Limitations Info  Sight, Speech, Hearing Sight Info: Adequate Hearing Info: Adequate Speech Info: Adequate    SPECIAL CARE FACTORS FREQUENCY  OT (By licensed OT), PT (By licensed PT)     PT Frequency: 5x week OT Frequency: 5x week            Contractures Contractures Info: Not present    Additional Factors Info  Code Status, Allergies, Psychotropic Code Status Info: full code Allergies Info: insulin aspart (novoLOG) injection 0-9 Units 3x daily; insulin glargine (LANTUS) injection 12 Units daily at bedtime Psychotropic Info: sertraline (ZOLOFT) tablet 100 mg daily PO         Current Medications (02/03/2019):  This is the current hospital active medication list Current Facility-Administered Medications  Medication Dose Route Frequency Provider Last Rate Last Dose  . acetaminophen (TYLENOL) tablet 650 mg  650 mg Oral  Q4H PRN Elgergawy, Silver Huguenin, MD   650 mg at 02/03/19 0600  . apixaban (ELIQUIS) tablet 5 mg  5 mg Oral BID Dang, Thuy D, RPH   5 mg at 02/03/19 1014  . diltiazem (CARDIZEM CD) 24 hr capsule 120 mg  120 mg Oral Daily Elgergawy, Silver Huguenin, MD   120 mg at 02/03/19 1014  . feeding supplement (ENSURE ENLIVE) (ENSURE ENLIVE)  liquid 237 mL  237 mL Oral Q24H Elgergawy, Silver Huguenin, MD   237 mL at 02/02/19 1733  . insulin aspart (novoLOG) injection 0-9 Units  0-9 Units Subcutaneous TID WC Elgergawy, Silver Huguenin, MD   2 Units at 02/02/19 2116  . insulin glargine (LANTUS) injection 12 Units  12 Units Subcutaneous QHS Florencia Reasons, MD   12 Units at 02/02/19 2116  . Melatonin TABS 6 mg  6 mg Oral QHS Elgergawy, Silver Huguenin, MD   6 mg at 02/02/19 2117  . oxybutynin (DITROPAN-XL) 24 hr tablet 10 mg  10 mg Oral Daily Elgergawy, Silver Huguenin, MD   10 mg at 02/03/19 1014  . piperacillin-tazobactam (ZOSYN) IVPB 3.375 g  3.375 g Intravenous Q8H Elgergawy, Silver Huguenin, MD 12.5 mL/hr at 02/03/19 0604 3.375 g at 02/03/19 0604  . sertraline (ZOLOFT) tablet 100 mg  100 mg Oral Daily Elgergawy, Silver Huguenin, MD   100 mg at 02/03/19 1015  . simvastatin (ZOCOR) tablet 20 mg  20 mg Oral QHS Elgergawy, Silver Huguenin, MD   20 mg at 02/02/19 2126  . sodium chloride flush (NS) 0.9 % injection 5 mL  5 mL Intracatheter Q8H Wagner, Jaime, DO   5 mL at 02/03/19 0600  . traMADol (ULTRAM) tablet 50 mg  50 mg Oral Q6H PRN Elgergawy, Silver Huguenin, MD   50 mg at 02/03/19 0600     Discharge Medications: Please see discharge summary for a list of discharge medications.  Relevant Imaging Results:  Relevant Lab Results:   Additional Information SS# [id number]  [place], [place]

## 2019-02-03 NOTE — Progress Notes (Signed)
IR note via telephone per new regulations.  Intraabdominal abscess s/p abscess drain placement 01/31/2019 by Dr. Earleen Newport.  RN reports abscess drain site c/d/i with minimal output of thick tan drainage in JP drain today; drain flushes/aspirates without resistance.  Continue current drain management- continue with Qshift flushes and monitor of output. IR to follow.  Bea Graff Louk, PA-C 02/03/2019, 1:24 PM

## 2019-02-03 NOTE — Consult Note (Addendum)
[facility] Nurse ostomy consult note Pt is familiar to [facility] team from recent admission.  Surgical team is following for assessment and plan of care to abd wound.   Stoma type/location:  Pt had colostomy surgery in Feb 2020.  She states she has been staying at a SNF where she has total assistance with pouch application and emptying prior to admission.   Stomal assessment/size: Stoma is red and viable, flush with skin level and located in a slight crease. 1 1/4 inches Peristomal assessment:  Intact skin surrounding Output: small amt brown slightly formed stool  Ostomy pouching: 2pc.  Education provided:  Demonstrated pouch change using barrier ring to attempt to maintain a seal, and 2 piece pouching system.  Pt states she does not perform her own pouching routines and will again have total assistance after discharge and is eager to know when the ostomy will be reversed. Extra supplies at the bedside for staff nurse use.  Julien Girt MSN, RN, [place], [place], [place]

## 2019-02-03 NOTE — Progress Notes (Signed)
PROGRESS NOTE  Cassandra Allen WPY:[id number] DOB: 1946/05/09 DOA: 01/31/2019 PCP: [name], MD  HPI/Recap of past 24 hours:  Denies pain, no fever, no n/v, reports good appetite + drain , + colostomy  She asked when is her colostomy can be reversed   Assessment/Plan: Active Problems:   Essential (primary) hypertension   Insulin dependent diabetes mellitus (HCC)   Atrial fibrillation, chronic   Anticoagulant long-term use   Cancer of sigmoid colon    Postoperative intra-abdominal abscess  Intraabdominal abscess S/p IR drain on 3/20 Blood culture no growth, drain Culture in process, on zosyn General surgery oked to resume anticoagulation  Metastatic adenocarcinoma -Colon cancer with known mets to the liver  Afib/RVR -eliquis held on admission due to anticipating procedure -resume eliquis  -heart rate now controlled, continue cardizem  Hypokalemia/hypomagnesemia: Replaced and normalized  Chronic diastolic chf Hold Lasix initially due to poor oral intake She has some mild pitting lower extremity edema, appetite is good, resume home oral lasix q72hrs from today  Insulin dependent Diabetes mellitus type 2 - lantus 12units -continue with insulin sliding scale  Normocytic anemia: Likely anemia of chronic disease No overt sign of bleeding  FTT: needs PT, return to SNF once medically stable    Code Status: full  Family Communication: patient   Disposition Plan: not ready to discharge, need to wait on culture result, need general surgery and IR clearance   Consultants:  General surgery  IR clearance  Procedures: US guided drainage of abdominal abscess.  57F biliary drain.   Antibiotics:  zosyn   Objective: BP 133/77 (BP Location: Right Arm)   Pulse 79   Temp (!) 97.4 F (36.3 C) (Oral)   Resp 18   Ht 5\' 2"  (1.575 m)   Wt 78.4 kg   SpO2 100%   BMI 31.61 kg/m   Intake/Output Summary (Last 24 hours) at 02/03/2019 0850 Last data  filed at 02/03/2019 0813 Gross per 24 hour  Intake 2008.16 ml  Output 695 ml  Net 1313.16 ml   Filed Weights   02/03/19 0035  Weight: 78.4 kg    Exam: Patient is examined daily including today on 02/03/2019, exams remain the same as of yesterday except that has changed    General:  Frail, NAD  Cardiovascular: RRR  Respiratory: CTABL  Abdomen: Soft/ND/NT, positive BS, + colostomy, + drain with purulent material  Musculoskeletal: No Edema  Neuro: alert, orientedx3 , impaired memory   Data Reviewed: Basic Metabolic Panel: Recent Labs  Lab 01/30/19 01/31/19 1301 02/01/19 0303 02/02/19 0318 02/03/19 0129  NA 142 142 137 138 137  K 3.9 4.0 3.9 3.3* 4.1  CL 107 107 103 106 104  CO2 25 24 23 26 26   GLUCOSE  --  123* 110* 248* 188*  BUN 22* 19 16 14 17   CREATININE 0.6 0.64 0.60 0.79 0.91  CALCIUM 8.0 8.5* 8.2* 7.5* 7.6*  MG  --   --   --  1.3* 2.0   Liver Function Tests: Recent Labs  Lab 01/31/19 1301  AST 11*  ALT 15  ALKPHOS 234*  BILITOT 0.5  PROT 5.3*  ALBUMIN 1.6*   No results for input(s): LIPASE, AMYLASE in the last 168 hours. No results for input(s): AMMONIA in the last 168 hours. CBC: Recent Labs  Lab 01/30/19 01/31/19 1301 02/01/19 0303 02/02/19 0318 02/03/19 0129  WBC 11.8 8.6 5.9 6.1 7.3  NEUTROABS  --  7.0  --  4.4  --   HGB 9.1*  8.8* 8.4* 8.0* 8.0*  HCT 28* 28.9* 27.3* 25.9* 27.5*  MCV  --  91.5 90.1 90.2 91.4  PLT 361 366 327 346 362   Cardiac Enzymes:   No results for input(s): CKTOTAL, CKMB, CKMBINDEX, TROPONINI in the last 168 hours. BNP (last 3 results) Recent Labs    12/22/18 1322 12/27/18 0429  BNP 388.1* 114.3*    ProBNP (last 3 results) No results for input(s): PROBNP in the last 8760 hours.  CBG: Recent Labs  Lab 02/02/19 0812 02/02/19 1232 02/02/19 1721 02/02/19 2000 02/03/19 0808  GLUCAP 133* 232* 188* 161* 108*    Recent Results (from the past 240 hour(s))  Blood culture (routine x 2)     Status: None  (Preliminary result)   Collection Time: 01/31/19  1:09 PM  Result Value Ref Range Status   Specimen Description BLOOD RIGHT ARM  Final   Special Requests   Final    BOTTLES DRAWN AEROBIC AND ANAEROBIC Blood Culture adequate volume   Culture   Final    NO GROWTH 2 DAYS Performed at [facility], [place] [address] [place], [place] [address]    Report Status PENDING  Incomplete  Blood culture (routine x 2)     Status: None (Preliminary result)   Collection Time: 01/31/19  1:14 PM  Result Value Ref Range Status   Specimen Description BLOOD LEFT ANTECUBITAL  Final   Special Requests   Final    BOTTLES DRAWN AEROBIC AND ANAEROBIC Blood Culture adequate volume   Culture   Final    NO GROWTH 2 DAYS Performed at [facility], [place] [address], [place], [place] [address]    Report Status PENDING  Incomplete  Aerobic/Anaerobic Culture (surgical/deep wound)     Status: None (Preliminary result)   Collection Time: 01/31/19  5:41 PM  Result Value Ref Range Status   Specimen Description ABDOMEN  Final   Special Requests NONE  Final   Gram Stain   Final    ABUNDANT WBC PRESENT,BOTH PMN AND MONONUCLEAR MODERATE GRAM POSITIVE COCCI FEW GRAM VARIABLE ROD    Culture   Final    CULTURE REINCUBATED FOR BETTER GROWTH Performed at [facility], [place] [address], [place], [place] [address]    Report Status PENDING  Incomplete     Studies: No results found.  Scheduled Meds: . apixaban  5 mg Oral BID  . diltiazem  120 mg Oral Daily  . feeding supplement (ENSURE ENLIVE)  237 mL Oral Q24H  . insulin aspart  0-9 Units Subcutaneous TID WC  . insulin glargine  12 Units Subcutaneous QHS  . Melatonin  6 mg Oral QHS  . oxybutynin  10 mg Oral Daily  . sertraline  100 mg Oral Daily  . simvastatin  20 mg Oral QHS  . sodium chloride flush  5 mL Intracatheter Q8H    Continuous Infusions: . piperacillin-tazobactam (ZOSYN)  IV 3.375 g (02/03/19 0604)     Time spent: 53mins I have  personally reviewed and interpreted on  02/03/2019 daily labs,  imagings as discussed above under date review session and assessment and plans.  I reviewed all nursing notes, pharmacy notes, consultant notes,  vitals, pertinent old records  I have discussed plan of care as described above with RN , patient  on 02/03/2019   Florencia Reasons MD, PhD  [facility] Pager [phone]. If 7PM-7AM, please contact night-coverage at [email or web address], password [facility] 02/03/2019, 8:50 AM  LOS: 3 days

## 2019-02-04 LAB — AEROBIC/ANAEROBIC CULTURE W GRAM STAIN (SURGICAL/DEEP WOUND)

## 2019-02-04 LAB — GLUCOSE, CAPILLARY
Glucose-Capillary: 149 mg/dL — ABNORMAL HIGH (ref 70–99)
Glucose-Capillary: 219 mg/dL — ABNORMAL HIGH (ref 70–99)
Glucose-Capillary: 184 mg/dL — ABNORMAL HIGH (ref 70–99)
Glucose-Capillary: 179 mg/dL — ABNORMAL HIGH (ref 70–99)

## 2019-02-04 MED ORDER — METHOCARBAMOL 500 MG PO TABS
500.0000 mg | ORAL_TABLET | Freq: Three times a day (TID) | ORAL | Status: DC | PRN
  Administered 2019-02-04 – 2019-02-07 (×6): 500 mg via ORAL
  Filled 2019-02-04 (×6): qty 1

## 2019-02-04 MED ORDER — TROLAMINE SALICYLATE 10 % EX CREA: TOPICAL_CREAM | Freq: Four times a day (QID) | CUTANEOUS | Status: DC | PRN

## 2019-02-04 MED ORDER — MUSCLE RUB 10-15 % EX CREA
TOPICAL_CREAM | Freq: Four times a day (QID) | CUTANEOUS | Status: DC | PRN
  Administered 2019-02-08: 1 via TOPICAL
  Filled 2019-02-04: qty 85

## 2019-02-04 MED ORDER — AMOXICILLIN-POT CLAVULANATE 875-125 MG PO TABS
1.0000 | ORAL_TABLET | Freq: Two times a day (BID) | ORAL | Status: DC
  Administered 2019-02-04 – 2019-02-09 (×10): 1 via ORAL
  Filled 2019-02-04 (×10): qty 1

## 2019-02-04 NOTE — Progress Notes (Signed)
[facility] Progress Note     Subjective: CC-  No new complaints. Feels about the same as yesterday. Has not eaten breakfast yet today, but tolerated dinner well. Some intermittent nausea, no emesis. Colostomy functioning.  Objective: Vital signs in last 24 hours: Temp:  [97.4 F (36.3 C)-98.6 F (37 C)] 97.4 F (36.3 C) (03/24 0455) Pulse Rate:  [69-85] 85 (03/24 0455) Resp:  [17-21] 17 (03/24 0455) BP: (111-148)/(62-86) 119/86 (03/24 0455) SpO2:  [96 %-99 %] 98 % (03/24 0455) Last BM Date: 02/03/19  Intake/Output from previous day: 03/23 0701 - 03/24 0700 In: 509.6 [P.O.:300; I.V.:5; IV Piggyback:204.6] Out: 870 [Urine:250; Drains:170; Stool:450] Intake/Output this shift: No intake/output data recorded.  PE: Gen:  Alert, NAD, pleasant HEENT: EOM's intact, pupils equal and round Pulm:  effort normal Abd: Soft, NT/ND, +BS, JP with purulent drainage, colostomy functioning Ext:  Calves soft and nontender Skin: no rashes noted, warm and dry   Lab Results:  Recent Labs    02/02/19 0318 02/03/19 0129  WBC 6.1 7.3  HGB 8.0* 8.0*  HCT 25.9* 27.5*  PLT 346 362   BMET Recent Labs    02/02/19 0318 02/03/19 0129  NA 138 137  K 3.3* 4.1  CL 106 104  CO2 26 26  GLUCOSE 248* 188*  BUN 14 17  CREATININE 0.79 0.91  CALCIUM 7.5* 7.6*   PT/INR No results for input(s): LABPROT, INR in the last 72 hours. CMP     Component Value Date/Time   NA 137 02/03/2019 0129   NA 142 01/30/2019   K 4.1 02/03/2019 0129   CL 104 02/03/2019 0129   CL 107 01/30/2019   CO2 26 02/03/2019 0129   CO2 25 01/30/2019   GLUCOSE 188 (H) 02/03/2019 0129   BUN 17 02/03/2019 0129   BUN 22 (A) 01/30/2019   CREATININE 0.91 02/03/2019 0129   CREATININE 1.04 (H) 12/18/2018 1029   CALCIUM 7.6 (L) 02/03/2019 0129   CALCIUM 8.0 01/30/2019   PROT 5.3 (L) 01/31/2019 1301   PROT 5.3 (A) 01/23/2019   PROT 5.3 (A) 01/23/2019   ALBUMIN 1.6 (L) 01/31/2019 1301   ALBUMIN 2.4  01/23/2019   ALBUMIN 2.4 01/23/2019   AST 11 (L) 01/31/2019 1301   AST 60 (H) 12/18/2018 1029   ALT 15 01/31/2019 1301   ALT 117 (H) 12/18/2018 1029   ALKPHOS 234 (H) 01/31/2019 1301   BILITOT 0.5 01/31/2019 1301   BILITOT 0.5 12/18/2018 1029   GFRNONAA >60 02/03/2019 0129   GFRNONAA 54 (L) 12/18/2018 1029   GFRAA >60 02/03/2019 0129   GFRAA >60 12/18/2018 1029   Lipase     Component Value Date/Time   LIPASE 24 12/25/2018 2254       Studies/Results: No results found.  Anti-infectives: Anti-infectives (From admission, onward)   Start     Dose/Rate Route Frequency Ordered Stop   01/31/19 2100  piperacillin-tazobactam (ZOSYN) IVPB 3.375 g     3.375 g 12.5 mL/hr over 240 Minutes Intravenous Every 8 hours 01/31/19 1525     01/31/19 1430  piperacillin-tazobactam (ZOSYN) IVPB 3.375 g  Status:  Discontinued     3.375 g 12.5 mL/hr over 240 Minutes Intravenous Every 8 hours 01/31/19 1429 01/31/19 1525   01/31/19 1330  piperacillin-tazobactam (ZOSYN) IVPB 3.375 g     3.375 g 100 mL/hr over 30 Minutes Intravenous  Once 01/31/19 1323 01/31/19 1527       Assessment/Plan Atrial fibrillation on Eliquis Hypertension Acute on chronic kidney disease stage  III Type 2 diabetes Hx depression Low back pain - add aspercreme, heat, robaxin PRN  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 Hassell Done Large intra-abdominal abscess - Successful percutaneous drainage 3/20 - broad spectrum antibiotics - stoma care - tolerating diet, colostomy functioning  FEN:CM diet ID: zosyn 3/20>>cultures pending DVT: Eliquis 5 mg daily Follow up: Dr. Hassell Done  Plan: Spoke with pharmacist/lab, culture growing Enterococcus faecalis and an anaerobe, final report hopefully to be back this afternoon vs tomorrow. Continue zosyn for now. May be ready for discharge back to SNF  tomorrow.   LOS: 4 days    Wellington [place] , [facility] 02/04/2019, 8:23 AM Pager: [phone] Mon-Thurs 7:00 am-4:30 pm Fri 7:00 am -11:30 AM Sat-Sun 7:00 am-11:30 am

## 2019-02-04 NOTE — Progress Notes (Signed)
IR note via telephone per new regulations.  Intraabdominal abscess s/p abscess drain placement 01/31/2019 by Dr. Earleen Newport.  If patient is ready for discharge: - Plan to follow-up in drain clinic in 7-10 days for CT abdomen/pelvis and drain injection (order in place). - Plan to flush drain Qdaily with 10 cc sterile saline. - Record output daily.  Please call IR with questions/concerns.  Bea Graff Rod Majerus, PA-C 02/04/2019, 9:55 AM

## 2019-02-04 NOTE — Progress Notes (Signed)
Physical Therapy Treatment Patient Details Name: Cassandra Allen MRN: [id number] DOB: December 19, 1945 Today's Date: 02/04/2019    History of Present Illness Cassandra Allen  is a 73 y.o. female, has medical history of atrial fibrillation on Eliquis, hypertension, diabetes mellitus, colon cancer, metastatic to the liver, status post exploratory laparotomy with partial colectomy, and transverse end colostomy 12/27/2018, patient discharged to SNF, returned to hospital with weakness, and  intra-abdominal fluid collection abscess, s/p IR for drain 3/21    PT Comments    Continuing work on functional mobility and activity tolerance;  Able to practice sit to stand from recliner -- continues to need to work on initiating with anterior weight shift; Heavy posterior bias; Took a few steps in room with RW and +2 assist   Follow Up Recommendations  SNF     Equipment Recommendations  None recommended by PT    Recommendations for Other Services       Precautions / Restrictions Precautions Precautions: Fall Precaution Comments: recent ABD surgery and new colostomy, drain L side    Mobility  Bed Mobility                  Transfers Overall transfer level: Needs assistance Equipment used: Rolling walker (2 wheeled) Transfers: Sit to/from Stand Sit to Stand: Max assist;Mod assist         General transfer comment: Stood from recliner x3 with +2 assist (support on each side) and RW; heavy posterior lean, needing mod/max physical assist to support and shift weight forward over feet; also heavy assist t power up  Ambulation/Gait Ambulation/Gait assistance: +2 physical assistance;Mod assist Gait Distance (Feet): 3 Feet Assistive device: Rolling walker (2 wheeled) Gait Pattern/deviations: Decreased step length - right;Decreased step length - left;Trunk flexed     General Gait Details: Took a few, small steps forward with RW; chair pushed behind her   Stairs              Wheelchair Mobility    Modified Rankin (Stroke Patients Only)       Balance     Sitting balance-Leahy Scale: Fair       Standing balance-Leahy Scale: Zero                              Cognition Arousal/Alertness: Awake/alert Behavior During Therapy: WFL for tasks assessed/performed Overall Cognitive Status: Within Functional Limits for tasks assessed                                        Exercises      General Comments        Pertinent Vitals/Pain Pain Assessment: Faces Faces Pain Scale: Hurts little more Pain Location: Abdomen with motion, and L knee pain in standing Pain Descriptors / Indicators: Aching;Discomfort;Sore Pain Intervention(s): Monitored during session    Home Living                      Prior Function            PT Goals (current goals can now be found in the care plan section) Acute Rehab PT Goals Patient Stated Goal: to get to where she can walk PT Goal Formulation: With patient Time For Goal Achievement: 02/16/19 Potential to Achieve Goals: Good Progress towards PT goals: Progressing toward goals(slowly)    Frequency  Min 2X/week      PT Plan Current plan remains appropriate    Co-evaluation              AM-PAC PT "6 Clicks" Mobility   Outcome Measure  Help needed turning from your back to your side while in a flat bed without using bedrails?: A Lot Help needed moving from lying on your back to sitting on the side of a flat bed without using bedrails?: A Lot Help needed moving to and from a bed to a chair (including a wheelchair)?: A Lot Help needed standing up from a chair using your arms (e.g., wheelchair or bedside chair)?: Total Help needed to walk in hospital room?: A Lot Help needed climbing 3-5 steps with a railing? : Total 6 Click Score: 10    End of Session Equipment Utilized During Treatment: Gait belt Activity Tolerance: Patient tolerated treatment  well Patient left: in chair;with call bell/phone within reach;with chair alarm set Nurse Communication: Mobility status;Need for lift equipment PT Visit Diagnosis: Muscle weakness (generalized) (M62.81);Difficulty in walking, not elsewhere classified (R26.2)     Time: 3491-7915 PT Time Calculation (min) (ACUTE ONLY): 16 min  Charges:  $Therapeutic Activity: 8-22 mins                     Roney Marion, PT  Acute Rehabilitation Services Pager [phone] Office [place] 02/04/2019, 5:05 PM

## 2019-02-04 NOTE — Progress Notes (Signed)
PROGRESS NOTE  Cassandra Allen ZOX:[id number] DOB: [birth year] DOA: 01/31/2019 PCP: [name], MD  HPI/Recap of past 24 hours:  Reports some mild pain, no fever, no n/v, reports good appetite + drain , + colostomy    Assessment/Plan: Active Problems:   Essential (primary) hypertension   Insulin dependent diabetes mellitus (HCC)   Atrial fibrillation, chronic   Anticoagulant long-term use   Cancer of sigmoid colon    Postoperative intra-abdominal abscess  Intraabdominal abscess S/p IR drain on 3/20 Blood culture no growth, drain Culture grew enterococcus faecalis that is sensitive to ampicillin, she was  on zosyn, changed to augmentin per general surgery recommendation General surgery oked to resume anticoagulation  Metastatic adenocarcinoma -Colon cancer with known mets to the liver  -her oncologist is [name]  Afib/RVR -eliquis held on admission due to anticipating procedure -eliquis resumed -heart rate now controlled, continue cardizem  Chronic diastolic chf Hold Lasix initially due to poor oral intake She has some mild pitting lower extremity edema, appetite is good, resume home oral lasix q72hrs from today  Hypokalemia/hypomagnesemia: Replaced and normalized  Insulin dependent Diabetes mellitus type 2 - lantus 12units -continue with insulin sliding scale  Normocytic anemia: Likely anemia of chronic disease No overt sign of bleeding  FTT: needs PT, return to SNF once medically stable    Code Status: full  Family Communication: patient   Disposition Plan: likely discharge to SNF tomorrow with general surgery and IR clearance   Consultants:  General surgery  IR clearance  Procedures: US guided drainage of abdominal abscess.  30F biliary drain.   Antibiotics:  Zosyn then augmentin   Objective: BP 125/80   Pulse 85   Temp (!) 97.4 F (36.3 C) (Oral)   Resp 17   Ht 5\' 2"  (1.575 m)   Wt 78.4 kg   SpO2 98%   BMI 31.61 kg/m    Intake/Output Summary (Last 24 hours) at 02/04/2019 1021 Last data filed at 02/04/2019 8119 Gross per 24 hour  Intake 989.59 ml  Output 520 ml  Net 469.59 ml   Filed Weights   02/03/19 0035  Weight: 78.4 kg    Exam: Patient is examined daily including today on 02/04/2019, exams remain the same as of yesterday except that has changed    General:  Frail, NAD  Cardiovascular: RRR  Respiratory: CTABL  Abdomen: Soft/ND/NT, positive BS, + colostomy, + drain with purulent material  Musculoskeletal: No Edema  Neuro: alert, orientedx3 , impaired memory   Data Reviewed: Basic Metabolic Panel: Recent Labs  Lab 01/30/19 01/31/19 1301 02/01/19 0303 02/02/19 0318 02/03/19 0129  NA 142 142 137 138 137  K 3.9 4.0 3.9 3.3* 4.1  CL 107 107 103 106 104  CO2 25 24 23 26 26   GLUCOSE  --  123* 110* 248* 188*  BUN 22* 19 16 14 17   CREATININE 0.6 0.64 0.60 0.79 0.91  CALCIUM 8.0 8.5* 8.2* 7.5* 7.6*  MG  --   --   --  1.3* 2.0   Liver Function Tests: Recent Labs  Lab 01/31/19 1301  AST 11*  ALT 15  ALKPHOS 234*  BILITOT 0.5  PROT 5.3*  ALBUMIN 1.6*   No results for input(s): LIPASE, AMYLASE in the last 168 hours. No results for input(s): AMMONIA in the last 168 hours. CBC: Recent Labs  Lab 01/30/19 01/31/19 1301 02/01/19 0303 02/02/19 0318 02/03/19 0129  WBC 11.8 8.6 5.9 6.1 7.3  NEUTROABS  --  7.0  --  4.4  --   HGB 9.1* 8.8* 8.4* 8.0* 8.0*  HCT 28* 28.9* 27.3* 25.9* 27.5*  MCV  --  91.5 90.1 90.2 91.4  PLT 361 366 327 346 362   Cardiac Enzymes:   No results for input(s): CKTOTAL, CKMB, CKMBINDEX, TROPONINI in the last 168 hours. BNP (last 3 results) Recent Labs    12/22/18 1322 12/27/18 0429  BNP 388.1* 114.3*    ProBNP (last 3 results) No results for input(s): PROBNP in the last 8760 hours.  CBG: Recent Labs  Lab 02/03/19 0808 02/03/19 1228 02/03/19 1721 02/03/19 2029 02/04/19 0759  GLUCAP 108* 164* 198* 202* 149*    Recent Results (from the  past 240 hour(s))  Blood culture (routine x 2)     Status: None (Preliminary result)   Collection Time: 01/31/19  1:09 PM  Result Value Ref Range Status   Specimen Description BLOOD RIGHT ARM  Final   Special Requests   Final    BOTTLES DRAWN AEROBIC AND ANAEROBIC Blood Culture adequate volume   Culture   Final    NO GROWTH 3 DAYS Performed at [facility], [place] [address] [place], [place] [address]    Report Status PENDING  Incomplete  Blood culture (routine x 2)     Status: None (Preliminary result)   Collection Time: 01/31/19  1:14 PM  Result Value Ref Range Status   Specimen Description BLOOD LEFT ANTECUBITAL  Final   Special Requests   Final    BOTTLES DRAWN AEROBIC AND ANAEROBIC Blood Culture adequate volume   Culture   Final    NO GROWTH 3 DAYS Performed at [facility], [place] [address], [place], [place] [address]    Report Status PENDING  Incomplete  Aerobic/Anaerobic Culture (surgical/deep wound)     Status: None (Preliminary result)   Collection Time: 01/31/19  5:41 PM  Result Value Ref Range Status   Specimen Description ABDOMEN  Final   Special Requests NONE  Final   Gram Stain   Final    ABUNDANT WBC PRESENT,BOTH PMN AND MONONUCLEAR MODERATE GRAM POSITIVE COCCI FEW GRAM VARIABLE ROD    Culture   Final    FEW ENTEROCOCCUS FAECALIS HOLDING FOR POSSIBLE ANAEROBE Performed at [facility], [place] [address], [place],  [address]    Report Status PENDING  Incomplete   Organism ID, Bacteria ENTEROCOCCUS FAECALIS  Final      Susceptibility   Enterococcus faecalis - MIC*    AMPICILLIN <=2 SENSITIVE Sensitive     VANCOMYCIN 1 SENSITIVE Sensitive     GENTAMICIN SYNERGY SENSITIVE Sensitive     * FEW ENTEROCOCCUS FAECALIS     Studies: No results found.  Scheduled Meds: . apixaban  5 mg Oral BID  . diltiazem  120 mg Oral Daily  . feeding supplement (ENSURE ENLIVE)  237 mL Oral Q24H  . furosemide  20 mg Oral Q72H  . insulin aspart  0-9 Units  Subcutaneous TID WC  . insulin glargine  12 Units Subcutaneous QHS  . Melatonin  6 mg Oral QHS  . oxybutynin  10 mg Oral Daily  . sertraline  100 mg Oral Daily  . simvastatin  20 mg Oral QHS  . sodium chloride flush  5 mL Intracatheter Q8H    Continuous Infusions: . piperacillin-tazobactam (ZOSYN)  IV 3.375 g (02/04/19 0515)     Time spent: 84mins I have personally reviewed and interpreted on  02/04/2019 daily labs,  imagings as discussed above  under date review session and assessment and plans.  I reviewed all nursing notes, pharmacy notes, consultant notes,  vitals, pertinent old records  I have discussed plan of care as described above with RN , patient  on 02/04/2019   Florencia Reasons MD, PhD  [facility] Pager [phone]. If 7PM-7AM, please contact night-coverage at [email or web address], password [facility] 02/04/2019, 10:21 AM  LOS: 4 days

## 2019-02-05 ENCOUNTER — Encounter (HOSPITAL_COMMUNITY): Payer: Self-pay | Admitting: Cardiology

## 2019-02-05 ENCOUNTER — Inpatient Hospital Stay (HOSPITAL_COMMUNITY): Payer: Medicare HMO

## 2019-02-05 DIAGNOSIS — R072 Precordial pain: Secondary | ICD-10-CM

## 2019-02-05 DIAGNOSIS — R1030 Lower abdominal pain, unspecified: Secondary | ICD-10-CM

## 2019-02-05 DIAGNOSIS — I482 Chronic atrial fibrillation, unspecified: Secondary | ICD-10-CM

## 2019-02-05 LAB — TROPONIN I
Troponin I: 0.04 ng/mL (ref <0.03)
Troponin I: 0.05 ng/mL (ref <0.03)
Troponin I: 0.06 ng/mL (ref <0.03)

## 2019-02-05 LAB — BASIC METABOLIC PANEL
Anion gap: 3 — ABNORMAL LOW (ref 5–15)
BUN: 15 mg/dL (ref 8–23)
CO2: 28 mmol/L (ref 22–32)
Calcium: 7.8 mg/dL — ABNORMAL LOW (ref 8.9–10.3)
Chloride: 106 mmol/L (ref 98–111)
Creatinine, Ser: 0.7 mg/dL (ref 0.44–1.00)
GFR calc Af Amer: >60 mL/min (ref >60)
GFR calc non Af Amer: >60 mL/min (ref >60)
Glucose, Bld: 144 mg/dL — ABNORMAL HIGH (ref 70–99)
Potassium: 4.2 mmol/L (ref 3.5–5.1)
Sodium: 137 mmol/L (ref 135–145)

## 2019-02-05 LAB — CBC WITH DIFFERENTIAL/PLATELET
Abs Immature Granulocytes: 0.23 10*3/uL — ABNORMAL HIGH (ref 0.00–0.07)
Basophils Absolute: 0 10*3/uL (ref 0.0–0.1)
Basophils Relative: 0 %
EOS PCT: 1 %
Eosinophils Absolute: 0.1 10*3/uL (ref 0.0–0.5)
HCT: 28.6 % — ABNORMAL LOW (ref 36.0–46.0)
HEMOGLOBIN: 8 g/dL — AB (ref 12.0–15.0)
Immature Granulocytes: 3 %
Lymphocytes Relative: 12 %
Lymphs Abs: 1.1 10*3/uL (ref 0.7–4.0)
MCH: 26.1 pg (ref 26.0–34.0)
MCHC: 28 g/dL — AB (ref 30.0–36.0)
MCV: 93.5 fL (ref 80.0–100.0)
Monocytes Absolute: 0.8 10*3/uL (ref 0.1–1.0)
Monocytes Relative: 9 %
Neutro Abs: 6.9 10*3/uL (ref 1.7–7.7)
Neutrophils Relative %: 75 %
Platelets: 355 10*3/uL (ref 150–400)
RBC: 3.06 MIL/uL — ABNORMAL LOW (ref 3.87–5.11)
RDW: 17.1 % — ABNORMAL HIGH (ref 11.5–15.5)
WBC: 9.2 10*3/uL (ref 4.0–10.5)
nRBC: 0.4 % — ABNORMAL HIGH (ref 0.0–0.2)

## 2019-02-05 LAB — CULTURE, BLOOD (ROUTINE X 2)
Culture: NO GROWTH
Culture: NO GROWTH
Special Requests: ADEQUATE
Special Requests: ADEQUATE

## 2019-02-05 LAB — GLUCOSE, CAPILLARY
Glucose-Capillary: 102 mg/dL — ABNORMAL HIGH (ref 70–99)
Glucose-Capillary: 94 mg/dL (ref 70–99)
Glucose-Capillary: 197 mg/dL — ABNORMAL HIGH (ref 70–99)
Glucose-Capillary: 214 mg/dL — ABNORMAL HIGH (ref 70–99)
Glucose-Capillary: 167 mg/dL — ABNORMAL HIGH (ref 70–99)

## 2019-02-05 LAB — MAGNESIUM: Magnesium: 1.6 mg/dL — ABNORMAL LOW (ref 1.7–2.4)

## 2019-02-05 MED ORDER — FUROSEMIDE 10 MG/ML IJ SOLN
20.0000 mg | Freq: Every day | INTRAMUSCULAR | Status: DC
  Administered 2019-02-05 – 2019-02-08 (×4): 20 mg via INTRAVENOUS
  Filled 2019-02-05 (×4): qty 2

## 2019-02-05 MED ORDER — MAGNESIUM SULFATE 2 GM/50ML IV SOLN
2.0000 g | Freq: Once | INTRAVENOUS | Status: AC
  Administered 2019-02-05: 2 g via INTRAVENOUS
  Filled 2019-02-05: qty 50

## 2019-02-05 MED ORDER — ALUM & MAG HYDROXIDE-SIMETH 200-200-20 MG/5ML PO SUSP: 30.0000 mL | ORAL | Status: DC | PRN

## 2019-02-05 MED ORDER — FERROUS SULFATE 325 (65 FE) MG PO TABS
325.0000 mg | ORAL_TABLET | Freq: Two times a day (BID) | ORAL | Status: DC
  Administered 2019-02-05 – 2019-02-09 (×8): 325 mg via ORAL
  Filled 2019-02-05 (×8): qty 1

## 2019-02-05 MED ORDER — SODIUM CHLORIDE 0.9 % IV SOLN
INTRAVENOUS | Status: DC | PRN
  Administered 2019-02-05: 200 mL via INTRAVENOUS
  Administered 2019-02-06 – 2019-02-07 (×2): 250 mL via INTRAVENOUS

## 2019-02-05 MED ORDER — ONDANSETRON HCL 4 MG/2ML IJ SOLN
4.0000 mg | Freq: Four times a day (QID) | INTRAMUSCULAR | Status: DC | PRN
  Administered 2019-02-05 – 2019-02-08 (×4): 4 mg via INTRAVENOUS
  Filled 2019-02-05 (×4): qty 2

## 2019-02-05 MED ORDER — SODIUM CHLORIDE 0.9% FLUSH
10.0000 mL | INTRAVENOUS | Status: DC | PRN
  Filled 2019-02-05: qty 40

## 2019-02-05 MED ORDER — DILTIAZEM HCL 25 MG/5ML IV SOLN
5.0000 mg | Freq: Once | INTRAVENOUS | Status: AC
  Administered 2019-02-05: 5 mg via INTRAVENOUS
  Filled 2019-02-05: qty 5

## 2019-02-05 NOTE — Progress Notes (Signed)
0600- Pt having chest pain, vitals signs stable. EKG done (Afib), MD notified.   0630 Given Tramadol 50 mg  0641 Given Diltiazem 5mg  once, tolerated well. VS stable  0650 Received order to transfer pt on telemetry surgical  Will continue to monitor.

## 2019-02-05 NOTE — Consult Note (Addendum)
Cardiology Consultation:   Patient ID: Cassandra Allen MRN: [id number]; DOB: January 27, 1946  Admit date: 01/31/2019 Date of Consult: 02/05/2019  Primary Care Provider: [name], MD Primary Cardiologist: Thompson Grayer, MD  Primary Electrophysiologist:  Thompson Grayer, MD    Patient Profile:   Cassandra Allen is a 73 y.o. female with a hx of permanent a fib, anemia with blood transfusions, on Xarelto, metastatic colon cancer on chemo, CKD, HTN, and DM  who is being seen today for the evaluation of atrial fib and chest pain at the request of Dr Tyrell Antonio.  History of Present Illness:   Cassandra Allen with hx of permanent a fib on Xarelto last seen in the office 06/2018. We did see in Feb 2020 in hospital for chest pain and dyspnea.  Echo with normal systolic function and IVC was not dilated. She had colostomy at that time for obstruction.  Now admitted 01/31/19 with ABD pain.   In ED CT abdomen pelvis significant for intra-abdominal abscess, no leukocytosis, she is afebrile, with normal lactic acid hemoglobin at 8.8, 10 and stable at 0.64    No EKG on admit seen in Epic per Dr. Waldron Labs with a fib RVR at HR 131.  eliquis held and IV heparin started.  Placement of IR drain.  Eliquis was resumed yesterday.  And HR was controlled at that time.  Lasix has been held due to low po intake, on lasix for chronic diastolic HF.    Today developed chest pain and EKG with increased HR with her a fib, IV dilt given. Her BP is in the 100s.  CXR with atelectasis and Rt sided pl effusion.  K+4.2,  Na 137, Cr 0.70 Ca+ 7.8 Mg 1.6  Troponin 0.04   EKG:  The EKG was personally reviewed and demonstrates:  A fib with RVR at 130 nonspecific ST abnormalities.  Telemetry:  Telemetry was personally reviewed and demonstrates:  A fib [id number]  Past Medical History:  Diagnosis Date  . Acute diastolic heart failure ([place]) 12/22/2018  . Arthritis   . Atrial fibrillation, chronic 12/22/2018  . Cancer of left colon  ([place]) 10/30/2018  . Cancer of sigmoid colon  12/27/2018  . Diabetes mellitus without complication ([place])   . Hypertension   . Obesity (BMI 30-39.9) 12/27/2018    Past Surgical History:  Procedure Laterality Date  . COLONOSCOPY  10/2018   Dr [name].  Large cancer at splenic flexure,  Bulky sigmoid colon mass, Numerous polyps  . IR IMAGING GUIDED PORT INSERTION  11/20/2018  . LAPAROTOMY N/A 12/27/2018   Procedure: LEFT SIGMOID COLECTOMY WITH HARTMANN POUCH AND END COLOSTOMY;  Surgeon: [name], MD;  Location: [facility];  Service: General;  Laterality: N/A;     Home Medications:  Prior to Admission medications   Medication Sig Start Date End Date Taking? Authorizing Provider  acetaminophen (TYLENOL) 325 MG tablet Take 650 mg by mouth every 4 (four) hours as needed for mild pain. Do not exceed more than 3gm total in 24 hrs   Yes [provider]  apixaban (ELIQUIS) 5 MG TABS tablet TAKE 1 TABLET (5 MG TOTAL) BY MOUTH 2 (TWO) TIMES DAILY. Patient taking differently: Take 5 mg by mouth 2 (two) times daily.  09/19/18  Yes [name], NP  diltiazem (CARDIZEM CD) 120 MG 24 hr capsule Take 1 capsule (120 mg total) by mouth daily. 01/08/19  Yes [name], [name], MD  feeding supplement, ENSURE ENLIVE, (ENSURE ENLIVE) LIQD Ensure Enlive one by mouth daily for  supplement   Yes [provider]  ferrous sulfate (KP FERROUS SULFATE) 325 (65 FE) MG tablet Take 325 mg by mouth daily with breakfast.   Yes [provider]  furosemide (LASIX) 20 MG tablet Lasix 20 mg po daily PRN for weight of greater than 3 lbs in a day or 5 lbs in a week Patient taking differently: Take 20 mg by mouth daily as needed (weight gain). For weight gain of greater than 3 lbs in a day or 5 lbs in a week 12/23/18  Yes [name], MD  insulin glargine (LANTUS) 100 UNIT/ML injection Inject 10 Units into the skin at bedtime.   Yes [provider]  Insulin NPH, Human,, Isophane, (NOVOLIN N  FLEXPEN) 100 UNIT/ML Kiwkpen Inject 12 Units into the skin 2 (two) times daily. FOR DM   Yes [provider]  Melatonin 3 MG TABS Take 6 mg by mouth at bedtime.    Yes [provider]  NON FORMULARY Med Pass 120 ml po bid   Yes [provider]  NON FORMULARY NSA Med Pass 120 ml po daily   Yes [provider]  ondansetron (ZOFRAN) 8 MG tablet Take 8 mg by mouth every 8 (eight) hours as needed for nausea.   Yes [provider]  oxybutynin (DITROPAN-XL) 10 MG 24 hr tablet Take 10 mg by mouth daily. 12/16/18  Yes [provider]  sertraline (ZOLOFT) 100 MG tablet Take 100 mg by mouth daily.    Yes [provider]  simvastatin (ZOCOR) 20 MG tablet Take 20 mg by mouth at bedtime.   Yes [provider]  traMADol (ULTRAM) 50 MG tablet Take 1 tablet (50 mg total) by mouth daily as needed. Take one tab a day prn pain--do NOT take within 12 hours of Zoloft administration!! Patient taking differently: Take 50 mg by mouth daily as needed for moderate pain. Take one tab a day as needed for pain--do NOT take within 12 hours of Zoloft administration!! 01/24/19  Yes Oscar La, Arlo C, PA-C  bisacodyl (DULCOLAX) 10 MG suppository Constipation ( 2 of 4) If not relieved by MOM give 10 mg Bisacodyl suppository rectally x 1 dose in 24 hours as needed ( (Do not use constipation standing orders for residents with renal failure/CFR less than 30. Contact MD for orders)    [provider]    Inpatient Medications: Scheduled Meds: . amoxicillin-clavulanate  1 tablet Oral Q12H  . apixaban  5 mg Oral BID  . diltiazem  120 mg Oral Daily  . feeding supplement (ENSURE ENLIVE)  237 mL Oral Q24H  . ferrous sulfate  325 mg Oral BID WC  . insulin aspart  0-9 Units Subcutaneous TID WC  . insulin glargine  12 Units Subcutaneous QHS  . Melatonin  6 mg Oral QHS  . oxybutynin  10 mg Oral Daily  . sertraline  100 mg Oral Daily  . simvastatin  20 mg Oral QHS   . sodium chloride flush  5 mL Intracatheter Q8H   Continuous Infusions: . sodium chloride 200 mL (02/05/19 0903)   PRN Meds: sodium chloride, acetaminophen, alum & mag hydroxide-simeth, methocarbamol, Muscle Rub, ondansetron, sodium chloride flush, traMADol  Allergies:    Allergies  Allergen Reactions  . Metformin And Related Nausea And Vomiting  . Prednisone Nausea And Vomiting    Social History:   Social History   Socioeconomic History  . Marital status: Divorced    Spouse name: Not on file  .  Number of children: 2  . Years of education: Not on file  . Highest education level: Not on file  Occupational History  . Not on file  Social Needs  . Financial resource strain: Not on file  . Food insecurity:    Worry: Not on file    Inability: Not on file  . Transportation needs:    Medical: Not on file    Non-medical: Not on file  Tobacco Use  . Smoking status: Never Smoker  . Smokeless tobacco: Never Used  Substance and Sexual Activity  . Alcohol use: No  . Drug use: No  . Sexual activity: Not on file  Lifestyle  . Physical activity:    Days per week: Not on file    Minutes per session: Not on file  . Stress: Not on file  Relationships  . Social connections:    Talks on phone: Not on file    Gets together: Not on file    Attends religious service: Not on file    Active member of club or organization: Not on file    Attends meetings of clubs or organizations: Not on file    Relationship status: Not on file  . Intimate partner violence:    Fear of current or ex partner: Not on file    Emotionally abused: Not on file    Physically abused: Not on file    Forced sexual activity: Not on file  Other Topics Concern  . Not on file  Social History Narrative  . Not on file    Family History:    Family History  Problem Relation Age of Onset  . Heart attack Mother   . Heart attack Father      ROS:  Recent abdominal pain Please see the history of present  illness.  General:no colds or fevers, no weight changes Skin:no rashes or ulcers HEENT:no blurred vision, no congestion CV:see HPI PUL:see HPI GI:no diarrhea constipation or melena, no indigestion see HPI, + colostomy and IR drain GU:no hematuria, no dysuria MS:no joint pain, no claudication Neuro:no syncope, no lightheadedness Endo:+ diabetes, no thyroid disease   All other ROS reviewed and negative.     Physical Exam/Data:   Vitals:   02/05/19 0625 02/05/19 0700 02/05/19 0814 02/05/19 1145  BP:  125/62 118/69 108/63  Pulse: (!) 130  (!) 110   Resp:  18 18 (!) 23  Temp:  98 F (36.7 C) 97.9 F (36.6 C) 97.9 F (36.6 C)  TempSrc:  Oral Oral Oral  SpO2:  98% 97% 99%  Weight:   77.7 kg   Height:   5\' 2"  (1.575 m)     Intake/Output Summary (Last 24 hours) at 02/05/2019 1205 Last data filed at 02/05/2019 1000 Gross per 24 hour  Intake 425 ml  Output 2055 ml  Net -1630 ml   Last 3 Weights 02/05/2019 02/03/2019 01/20/2019  Weight (lbs) 171 lb 4.8 oz 172 lb 12.8 oz 172 lb 12.8 oz  Weight (kg) 77.7 kg 78.382 kg 78.382 kg     Body mass index is 31.33 kg/m.  Exam per Dr. Stanford Breed General:  Well nourished, well developed, in no acute distress HEENT: normal Lymph: no adenopathy Neck: no JVD Endocrine:  No thryomegaly Vascular: No carotid bruits; FA pulses 2+ bilaterally without bruits  Cardiac:  Irregular irregular; no murmur  Lungs:  clear to auscultation bilaterally, no wheezing, rhonchi or rales  Abd: mild tenderness, s/p abdominal surgery Ext: 1+ edema Musculoskeletal:  No deformities,  BUE and BLE strength normal and equal Skin: warm and dry  Neuro:  CNs 2-12 intact, no focal abnormalities noted Psych:  Normal affect    Relevant CV Studies: ECHO 12/2018 1. The left ventricle has normal systolic function of 65-78%. The cavity size was normal. There is no increased left ventricular wall thickness. Left ventricular diastology could not be evaluated secondary to atrial  fibrillation.  2. The right ventricle has normal systolic function. The cavity was normal. There is no increase in right ventricular wall thickness.  3. Left atrial size was mildly dilated.  4. Right atrial size was mildly dilated.  5. The mitral valve is normal in structure. There is mild thickening and mild calcification.  6. The tricuspid valve is normal in structure.  7. The aortic valve is tricuspid There is mild thickening and mild calcification of the aortic valve.  8. The pulmonic valve was normal in structure.  FINDINGS  Left Ventricle: The left ventricle has normal systolic function of 46-96%. The cavity size was normal. There is no increased left ventricular wall thickness. Left ventricular diastology could not be evaluated secondary to atrial fibrillation. Right Ventricle: The right ventricle has normal systolic function. The cavity was normal. There is no increase in right ventricular wall thickness. Left Atrium: left atrial size was mildly dilated Right Atrium: right atrial size was mildly dilated Interatrial Septum: No atrial level shunt detected by color flow Doppler.  Pericardium: There is no evidence of pericardial effusion. Mitral Valve: The mitral valve is normal in structure. There is mild thickening and mild calcification. Mitral valve regurgitation is trivial by color flow Doppler. Tricuspid Valve: The tricuspid valve is normal in structure. Tricuspid valve regurgitation is mild by color flow Doppler. Aortic Valve: The aortic valve is tricuspid There is mild thickening and mild calcification of the aortic valve. Aortic valve regurgitation was not visualized by color flow Doppler. Pulmonic Valve: The pulmonic valve was normal in structure. Pulmonic valve regurgitation is not visualized by color flow Doppler. Venous: The inferior vena cava is normal in size with greater than 50% respiratory variability.   LEFT VENTRICLE PLAX 2D (Teich) LV EF:          49.2 % LVIDd:           4.75 cm LVIDs:          3.57 cm LV PW:          1.04 cm LV IVS:         0.99 cm LVOT diam:      2.00 cm LV SV:          52 ml LVOT Area:      3.14 cm  RIGHT VENTRICLE RVSP:           47.6 mmHg  LEFT ATRIUM             Index       RIGHT ATRIUM           Index LA diam:        4.90 cm 2.67 cm/m  RA Pressure: 3 mmHg LA Vol (A2C):   70.6 ml 38.53 ml/m RA Area:     19.00 cm LA Vol (A4C):   55.6 ml 30.34 ml/m RA Volume:   53.90 ml  29.42 ml/m LA Biplane Vol: 64.3 ml 35.09 ml/m  AORTIC VALVE LVOT Vmax:   109.67 cm/s LVOT Vmean:  80.067 cm/s LVOT VTI:    0.185 m   AORTA Ao Root diam: 2.60 cm  MITRAL VALVE               TR Peak grad: 44.6 mmHg MV Area (PHT): 4.89 cm    TR Vmax:      334.00 cm/s MV PHT:        44.95 msec  RVSP:         47.6 mmHg MV Decel Time: 155 msec MV E velocity: 165.67 cm/s   Laboratory Data:  Chemistry Recent Labs  Lab 02/02/19 0318 02/03/19 0129 02/05/19 0444  NA 138 137 137  K 3.3* 4.1 4.2  CL 106 104 106  CO2 26 26 28   GLUCOSE 248* 188* 144*  BUN 14 17 15   CREATININE 0.79 0.91 0.70  CALCIUM 7.5* 7.6* 7.8*  GFRNONAA >60 >60 >60  GFRAA >60 >60 >60  ANIONGAP 6 7 3*    Recent Labs  Lab 01/31/19 1301  PROT 5.3*  ALBUMIN 1.6*  AST 11*  ALT 15  ALKPHOS 234*  BILITOT 0.5   Hematology Recent Labs  Lab 02/02/19 0318 02/03/19 0129 02/05/19 0444  WBC 6.1 7.3 9.2  RBC 2.87* 3.01* 3.06*  HGB 8.0* 8.0* 8.0*  HCT 25.9* 27.5* 28.6*  MCV 90.2 91.4 93.5  MCH 27.9 26.6 26.1  MCHC 30.9 29.1* 28.0*  RDW 15.8* 15.9* 17.1*  PLT 346 362 355   Cardiac Enzymes Recent Labs  Lab 02/05/19 0724  TROPONINI 0.04*   No results for input(s): TROPIPOC in the last 168 hours.  BNPNo results for input(s): BNP, PROBNP in the last 168 hours.  DDimer No results for input(s): DDIMER in the last 168 hours.  Radiology/Studies:  Dg Chest Port 1 View  Result Date: 02/05/2019 CLINICAL DATA:  Onset chest pain this morning. Atrial fibrillation  with rapid ventricular response. EXAM: PORTABLE CHEST 1 VIEW COMPARISON:  Single-view of the chest 12/31/2018. PA and lateral chest 07/10/2018. FINDINGS: There is a small to moderate left pleural effusion and basilar airspace disease. No right effusion. The right lung is clear. Cardiomegaly is identified. Aortic atherosclerosis is seen. Right IJ approach Port-A-Cath is unchanged since the most recent exam. IMPRESSION: Small to moderate left pleural effusion and basilar airspace disease which could be atelectasis or pneumonia. Cardiomegaly without edema. Electronically Signed   By: Inge Rise M.D.   On: 02/05/2019 09:44    Assessment and Plan:   1. Chest pain with troponin 0.04 may be from a fib RVR alone serial troponins.  2. A fib permanent but now RVR - was rapid on admit with acute illness   BP now 967 systolic would increase po dilt , continue eliquis see DR. Cadi Rhinehart's note, would hold off on nuc or cath depending on troponins.   3. Intra abd abscess, now with drain, per surgery  4. Hypomag. repleated 5. Chronic diastolic HF resume home lasix - but daily.   6. IDDM per IM 7. Metastatic adenocarcinoma   -colostomy       For questions or updates, please contact [facility] Please consult [email or web address] for contact info under     Signed, Cecilie Kicks, NP  02/05/2019 12:05 PM  As above, patient seen and examined.  Briefly she is a 73 year old female with past medical history of permanent atrial fibrillation, metastatic colon cancer, hypertension, diabetes mellitus, chronic kidney disease for evaluation of atrial fibrillation and chest pain.  Patient admitted with abdominal pain and CT revealed intra-abdominal abscess.  She had IR drain placed.  She now is doing well but had chest pain this morning.  It  was in the upper chest and described as sharp lasting 1 hour.  No radiation or associated symptoms.  Presently pain-free.  She typically does not have exertional chest pain.  Some  dyspnea on exertion and occasional pedal edema.  She was also noted to have elevated rate with atrial fibrillation and cardiology asked to evaluate. Troponin is 0.04 and 0.05.  Hemoglobin 8.  Electrocardiogram shows atrial fibrillation with rapid ventricular response and nonspecific ST changes.  1 chest pain-symptoms are somewhat atypical.  Electrocardiogram without diagnostic ST changes.  Troponins borderline and not consistent with acute coronary syndrome thus far.  Would continue to cycle.  If no clear trend would not pursue further ischemia evaluation particularly in light of metastatic colon cancer and ongoing treatment of abdominal abscess.  2 permanent atrial fibrillation-heart rate is elevated.  Would continue Cardizem at present dose and we will increase as needed.  Continue apixaban. CHADSvasc 4.  Recent elevation in heart rate likely driven by acute abdominal process.  3 abdominal abscess-drain in place.  Per primary care.  4 metastatic colon cancer  Kirk Ruths, MD

## 2019-02-05 NOTE — Progress Notes (Signed)
Attempted to call for report. 

## 2019-02-05 NOTE — Progress Notes (Addendum)
PROGRESS NOTE    Cassandra Allen  MBW:[id number] DOB: 06-14-1946 DOA: 01/31/2019 PCP: [name], MD    Brief Narrative: 73 year old with past medical history significant for A. fib on Eliquis, hypertension, diabetes, colon cancer, metastatic liver, status post exploratory laparotomy with partial colectomy and transverse  end colostomy on 12/27/2018 patient was discharged to skilled nursing facility she presented to the hospital on 3/20 complaining of generalized weakness, abdominal pain.  CT abdomen was performed which showed significant intra-abdominal abscess.  Patient was a started on Zosyn and general surgery was consulted who recommended drainage by IR.   Assessment & Plan:   Active Problems:   Essential (primary) hypertension   Insulin dependent diabetes mellitus (HCC)   Atrial fibrillation, chronic   Anticoagulant long-term use   Cancer of sigmoid colon    Postoperative intra-abdominal abscess   1-Intra-abdominal abscess; status post recent colectomy and transverse end colostomy on 12/27/2018 Status post IR drain on 3/20. Blood cultures no growth today. Drain culture grew Enterococcus faecalis that is sensitive to ampicillin.  Patient was changed to oral antibiotics per general surgery recommendation. She will need to follow-up with drain clinic.  A. fib with RVR: Eliquis was initially on hold due to anticipated procedure. She is now back on Eliquis. Overnight she developed A. fib with RVR.  She received 5 mg IV Cardizem and her morning dose of Cardizem.  Heart rate continued to be in the 017B and systolic blood pressure in the 100s. Cardiology has been consulted.  Chest pain, shortness of breath: Suspect related to A. fib RVR. Chest x-ray order: Show atelectasis and right side pleural effusion. Incentive spirometry ordered. Resume Lasix when blood pressure allows. Troponin mildly elevated in the setting of A. fib RVR.  Cardiology has been consulted.   Hypomagnesemia;  Replete IV.  Chronic diastolic heart failure: Will resume Lasix when blood pressure increased.  Insulin-dependent type 2 diabetes: Continue with Lantus and a sliding scale insulin.  Metastatic  adenocarcinoma: Colon cancer with no metastasis to liver. Follows with Dr. Annamaria Boots.  Failure to drive: Needed SNF at discharge.  Normocytic anemia: Follow hemoglobin.   Estimated body mass index is 31.33 kg/m as calculated from the following:   Height as of this encounter: 5\' 2"  (1.575 m).   Weight as of this encounter: 77.7 kg.   DVT prophylaxis: Eliquis Code Status: Full code Family Communication: Care discussed with patient Disposition Plan: SNF when heart rate control  Consultants:   General surgery  Cardiology   Procedures:  US guided drainage of abdominal abscess. 64F biliary drain.   Antimicrobials:  Zosyn subsequently Augmentin  Subjective: Events from earlier this morning noticed.  Patient developed A. fib with RVR and some chest pain. She is chest pain-free at this time.  She denies any worsening abdominal pain.  Objective: Vitals:   02/05/19 0600 02/05/19 0625 02/05/19 0700 02/05/19 0814  BP: 127/88  125/62 118/69  Pulse: 62 (!) 130  (!) 110  Resp: 20  18 18   Temp: 98.1 F (36.7 C)  98 F (36.7 C) 97.9 F (36.6 C)  TempSrc: Oral  Oral Oral  SpO2: 98%  98% 97%  Weight:    77.7 kg  Height:    5\' 2"  (1.575 m)    Intake/Output Summary (Last 24 hours) at 02/05/2019 0839 Last data filed at 02/05/2019 0741 Gross per 24 hour  Intake 665 ml  Output 2055 ml  Net -1390 ml   Filed Weights   02/03/19 0035 02/05/19 9390  Weight: 78.4 kg 77.7 kg    Examination:  General exam: Appears calm and comfortable  Respiratory system: Clear to auscultation. Respiratory effort normal. Cardiovascular system: S1 & S2 heard, IRR. No JVD, murmurs, rubs, gallops or clicks. No pedal edema. Gastrointestinal system: Abdomen soft, bowel sounds present,  colostomy in place, drain tube in place. Central nervous system: Alert and oriented. No focal neurological deficits. Extremities: Symmetric 5 x 5 power. Skin: No rashes, lesions or ulcers   Data Reviewed: I have personally reviewed following labs and imaging studies  CBC: Recent Labs  Lab 01/31/19 1301 02/01/19 0303 02/02/19 0318 02/03/19 0129 02/05/19 0444  WBC 8.6 5.9 6.1 7.3 9.2  NEUTROABS 7.0  --  4.4  --  6.9  HGB 8.8* 8.4* 8.0* 8.0* 8.0*  HCT 28.9* 27.3* 25.9* 27.5* 28.6*  MCV 91.5 90.1 90.2 91.4 93.5  PLT 366 327 346 362 563   Basic Metabolic Panel: Recent Labs  Lab 01/31/19 1301 02/01/19 0303 02/02/19 0318 02/03/19 0129 02/05/19 0444  NA 142 137 138 137 137  K 4.0 3.9 3.3* 4.1 4.2  CL 107 103 106 104 106  CO2 24 23 26 26 28   GLUCOSE 123* 110* 248* 188* 144*  BUN 19 16 14 17 15   CREATININE 0.64 0.60 0.79 0.91 0.70  CALCIUM 8.5* 8.2* 7.5* 7.6* 7.8*  MG  --   --  1.3* 2.0 1.6*   GFR: Estimated Creatinine Clearance: 61.3 mL/min (by C-G formula based on SCr of 0.7 mg/dL). Liver Function Tests: Recent Labs  Lab 01/31/19 1301  AST 11*  ALT 15  ALKPHOS 234*  BILITOT 0.5  PROT 5.3*  ALBUMIN 1.6*   No results for input(s): LIPASE, AMYLASE in the last 168 hours. No results for input(s): AMMONIA in the last 168 hours. Coagulation Profile: Recent Labs  Lab 01/31/19 1600  INR 2.6*   Cardiac Enzymes: No results for input(s): CKTOTAL, CKMB, CKMBINDEX, TROPONINI in the last 168 hours. BNP (last 3 results) No results for input(s): PROBNP in the last 8760 hours. HbA1C: No results for input(s): HGBA1C in the last 72 hours. CBG: Recent Labs  Lab 02/04/19 1234 02/04/19 1729 02/04/19 2149 02/05/19 0741 02/05/19 0820  GLUCAP 219* 184* 179* 102* 94   Lipid Profile: No results for input(s): CHOL, HDL, LDLCALC, TRIG, CHOLHDL, LDLDIRECT in the last 72 hours. Thyroid Function Tests: No results for input(s): TSH, T4TOTAL, FREET4, T3FREE, THYROIDAB in the  last 72 hours. Anemia Panel: No results for input(s): VITAMINB12, FOLATE, FERRITIN, TIBC, IRON, RETICCTPCT in the last 72 hours. Sepsis Labs: Recent Labs  Lab 01/31/19 1310 01/31/19 1427  LATICACIDVEN 1.0 0.7    Recent Results (from the past 240 hour(s))  Blood culture (routine x 2)     Status: None (Preliminary result)   Collection Time: 01/31/19  1:09 PM  Result Value Ref Range Status   Specimen Description BLOOD RIGHT ARM  Final   Special Requests   Final    BOTTLES DRAWN AEROBIC AND ANAEROBIC Blood Culture adequate volume   Culture   Final    NO GROWTH 4 DAYS Performed at [facility] [address] [place], [place] [address]    Report Status PENDING  Incomplete  Blood culture (routine x 2)     Status: None (Preliminary result)   Collection Time: 01/31/19  1:14 PM  Result Value Ref Range Status   Specimen Description BLOOD LEFT ANTECUBITAL  Final   Special Requests   Final    BOTTLES DRAWN AEROBIC  AND ANAEROBIC Blood Culture adequate volume   Culture   Final    NO GROWTH 4 DAYS Performed at [facility] [address], [place], [place] [address]    Report Status PENDING  Incomplete  Aerobic/Anaerobic Culture (surgical/deep wound)     Status: None   Collection Time: 01/31/19  5:41 PM  Result Value Ref Range Status   Specimen Description ABDOMEN  Final   Special Requests NONE  Final   Gram Stain   Final    ABUNDANT WBC PRESENT,BOTH PMN AND MONONUCLEAR MODERATE GRAM POSITIVE COCCI FEW GRAM VARIABLE ROD    Culture   Final    FEW ENTEROCOCCUS FAECALIS ABUNDANT PREVOTELLA BIVIA BETA LACTAMASE POSITIVE Performed at [facility], [place] [address], [place], [place] [address]    Report Status 02/04/2019 FINAL  Final   Organism ID, Bacteria ENTEROCOCCUS FAECALIS  Final      Susceptibility   Enterococcus faecalis - MIC*    AMPICILLIN <=2 SENSITIVE Sensitive     VANCOMYCIN 1 SENSITIVE Sensitive     GENTAMICIN SYNERGY SENSITIVE Sensitive     * FEW  ENTEROCOCCUS FAECALIS         Radiology Studies: No results found.      Scheduled Meds: . amoxicillin-clavulanate  1 tablet Oral Q12H  . apixaban  5 mg Oral BID  . diltiazem  120 mg Oral Daily  . feeding supplement (ENSURE ENLIVE)  237 mL Oral Q24H  . furosemide  20 mg Oral Q72H  . insulin aspart  0-9 Units Subcutaneous TID WC  . insulin glargine  12 Units Subcutaneous QHS  . Melatonin  6 mg Oral QHS  . oxybutynin  10 mg Oral Daily  . sertraline  100 mg Oral Daily  . simvastatin  20 mg Oral QHS  . sodium chloride flush  5 mL Intracatheter Q8H   Continuous Infusions:   LOS: 5 days    Time spent: 35 minutes     Elmarie Shiley, MD [facility] Pager [phone]  If 7PM-7AM, please contact night-coverage [email or web address] Password [facility] 02/05/2019, 8:39 AM

## 2019-02-05 NOTE — TOC Progression Note (Signed)
Transition of Care [facility]) - Progression Note    Patient Details  Name: Cassandra Allen MRN: [id number] Date of Birth: 1946/01/09  Transition [facility]) CM/SW Contact  Estanislado Emms, LCSW Phone Number: 02/05/2019, 1:16 PM  Clinical Narrative:   [facility] has received [facility] authorization for patient. CSW to follow for medical readiness and support with discharge planning.    Expected Discharge Plan: [place] Barriers to Discharge: Continued Medical Work up  Expected Discharge Plan and Services Expected Discharge Plan: [place] In-house Referral: Clinical Social Work   Post Acute Care Choice: [place] Living arrangements for the past 2 months: [place]                           Social Determinants of Health (SDOH) Interventions    Readmission Risk Interventions No flowsheet data found.

## 2019-02-05 NOTE — Progress Notes (Signed)
Pt transported to [place].

## 2019-02-05 NOTE — Progress Notes (Signed)
IR rounding note via telephone per new regulations.  Intraabdominal abscess s/p abscess drain placement 01/31/2019 by Dr. Earleen Newport.  RN reports abscess drain site c/d/i with approximately 20-25 cc of thick tan drainage in JP drain today.  Continue current drain management- continue with Qshift flushes and monitor of output. See prior note from 02/04/2019 regarding outpatient management. IR to follow.  Bea Graff Louk, PA-C 02/05/2019, 10:25 AM

## 2019-02-05 NOTE — Progress Notes (Signed)
[facility] Progress Note     Subjective: CC-  Over night events noted. Patient states that she started having chest pain and nausea early this morning. She was transferred to a telemetry floor. EKG showed A fib with RVR. Troponin 0.04.  Currently sitting up in bed eating breakfast. CP and nausea improved. Denies abdominal pain. Tolerating diet. Colostomy functioning.   Objective: Vital signs in last 24 hours: Temp:  [97.9 F (36.6 C)-98.6 F (37 C)] 97.9 F (36.6 C) (03/25 0814) Pulse Rate:  [62-130] 110 (03/25 0814) Resp:  [16-20] 18 (03/25 0814) BP: (113-127)/(62-88) 118/69 (03/25 0814) SpO2:  [97 %-100 %] 97 % (03/25 0814) Weight:  [77.7 kg] 77.7 kg (03/25 0814) Last BM Date: 02/04/19  Intake/Output from previous day: 03/24 0701 - 03/25 0700 In: 665 [P.O.:660; I.V.:5] Out: 1855 [Urine:1200; Drains:105; Stool:550] Intake/Output this shift: Total I/O In: -  Out: 200 [Urine:200]  PE: Gen: Alert, NAD, pleasant HEENT: EOM's intact, pupils equal and round Cardio: tachy 140's Pulm: effort normal Abd: Soft, NT/ND, +BS,JP with purulent drainage, colostomy functioning STM:HDQQIW soft and nontender Skin: no rashes noted, warm and dry    Lab Results:  Recent Labs    02/03/19 0129 02/05/19 0444  WBC 7.3 9.2  HGB 8.0* 8.0*  HCT 27.5* 28.6*  PLT 362 355   BMET Recent Labs    02/03/19 0129 02/05/19 0444  NA 137 137  K 4.1 4.2  CL 104 106  CO2 26 28  GLUCOSE 188* 144*  BUN 17 15  CREATININE 0.91 0.70  CALCIUM 7.6* 7.8*   PT/INR No results for input(s): LABPROT, INR in the last 72 hours. CMP     Component Value Date/Time   NA 137 02/05/2019 0444   NA 142 01/30/2019   K 4.2 02/05/2019 0444   CL 106 02/05/2019 0444   CL 107 01/30/2019   CO2 28 02/05/2019 0444   CO2 25 01/30/2019   GLUCOSE 144 (H) 02/05/2019 0444   BUN 15 02/05/2019 0444   BUN 22 (A) 01/30/2019   CREATININE 0.70 02/05/2019 0444   CREATININE 1.04 (H) 12/18/2018 1029    CALCIUM 7.8 (L) 02/05/2019 0444   CALCIUM 8.0 01/30/2019   PROT 5.3 (L) 01/31/2019 1301   PROT 5.3 (A) 01/23/2019   PROT 5.3 (A) 01/23/2019   ALBUMIN 1.6 (L) 01/31/2019 1301   ALBUMIN 2.4 01/23/2019   ALBUMIN 2.4 01/23/2019   AST 11 (L) 01/31/2019 1301   AST 60 (H) 12/18/2018 1029   ALT 15 01/31/2019 1301   ALT 117 (H) 12/18/2018 1029   ALKPHOS 234 (H) 01/31/2019 1301   BILITOT 0.5 01/31/2019 1301   BILITOT 0.5 12/18/2018 1029   GFRNONAA >60 02/05/2019 0444   GFRNONAA 54 (L) 12/18/2018 1029   GFRAA >60 02/05/2019 0444   GFRAA >60 12/18/2018 1029   Lipase     Component Value Date/Time   LIPASE 24 12/25/2018 2254       Studies/Results: No results found.  Anti-infectives: Anti-infectives (From admission, onward)   Start     Dose/Rate Route Frequency Ordered Stop   02/04/19 1345  amoxicillin-clavulanate (AUGMENTIN) 875-125 MG per tablet 1 tablet     1 tablet Oral Every 12 hours 02/04/19 1344 02/11/19 0959   01/31/19 2100  piperacillin-tazobactam (ZOSYN) IVPB 3.375 g  Status:  Discontinued     3.375 g 12.5 mL/hr over 240 Minutes Intravenous Every 8 hours 01/31/19 1525 02/04/19 1344   01/31/19 1430  piperacillin-tazobactam (ZOSYN) IVPB 3.375 g  Status:  Discontinued     3.375 g 12.5 mL/hr over 240 Minutes Intravenous Every 8 hours 01/31/19 1429 01/31/19 1525   01/31/19 1330  piperacillin-tazobactam (ZOSYN) IVPB 3.375 g     3.375 g 100 mL/hr over 30 Minutes Intravenous  Once 01/31/19 1323 01/31/19 1527       Assessment/Plan Atrial fibrillation on Eliquis - RVR this AM with mild troponin elevation, per primary Hypertension Acute on chronic kidney disease stage III Type 2 diabetes Hx depression Low back pain - aspercreme, heat, robaxin PRN  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 Hassell Done Large intra-abdominal abscess - Successful  percutaneous drainage3/20 - culture: FEW ENTEROCOCCUS FAECALIS , ABUNDANT PREVOTELLA BIVIA, BETA LACTAMASE POSITIVE - stoma care - tolerating diet, colostomy functioning  FEN:CM diet ID: zosyn 3/20>>3/25, augmentin 3/25>> (day#1/7) DVT: Eliquis 5 mg daily Follow up: Dr. Hassell Done  Plan: Transition to oral antibiotics today (augmentin) for 7 days. Continue drain. Mobilize.  From abdominal standpoint patient is doing well. F/u information with Dr. Hassell Done on AVS. Chest pain/a fib RVR per primary.   LOS: 5 days    Wellington Hampshire , [facility] 02/05/2019, 9:12 AM Pager: [phone] Mon-Thurs 7:00 am-4:30 pm Fri 7:00 am -11:30 AM Sat-Sun 7:00 am-11:30 am

## 2019-02-06 LAB — GLUCOSE, CAPILLARY
GLUCOSE-CAPILLARY: 167 mg/dL — AB (ref 70–99)
Glucose-Capillary: 185 mg/dL — ABNORMAL HIGH (ref 70–99)
Glucose-Capillary: 192 mg/dL — ABNORMAL HIGH (ref 70–99)
Glucose-Capillary: 215 mg/dL — ABNORMAL HIGH (ref 70–99)

## 2019-02-06 LAB — CBC
HCT: 28.4 % — ABNORMAL LOW (ref 36.0–46.0)
Hemoglobin: 8.6 g/dL — ABNORMAL LOW (ref 12.0–15.0)
MCH: 27.9 pg (ref 26.0–34.0)
MCHC: 30.3 g/dL (ref 30.0–36.0)
MCV: 92.2 fL (ref 80.0–100.0)
NRBC: 0.4 % — AB (ref 0.0–0.2)
Platelets: 375 10*3/uL (ref 150–400)
RBC: 3.08 MIL/uL — ABNORMAL LOW (ref 3.87–5.11)
RDW: 17.4 % — ABNORMAL HIGH (ref 11.5–15.5)
WBC: 10.2 10*3/uL (ref 4.0–10.5)

## 2019-02-06 LAB — MAGNESIUM: Magnesium: 1.8 mg/dL (ref 1.7–2.4)

## 2019-02-06 LAB — BASIC METABOLIC PANEL
ANION GAP: 10 (ref 5–15)
BUN: 16 mg/dL (ref 8–23)
CO2: 27 mmol/L (ref 22–32)
Calcium: 8.2 mg/dL — ABNORMAL LOW (ref 8.9–10.3)
Chloride: 100 mmol/L (ref 98–111)
Creatinine, Ser: 0.72 mg/dL (ref 0.44–1.00)
GFR calc Af Amer: >60 mL/min (ref >60)
GFR calc non Af Amer: >60 mL/min (ref >60)
Glucose, Bld: 211 mg/dL — ABNORMAL HIGH (ref 70–99)
Potassium: 4.8 mmol/L (ref 3.5–5.1)
Sodium: 137 mmol/L (ref 135–145)

## 2019-02-06 MED ORDER — DILTIAZEM HCL ER COATED BEADS 180 MG PO CP24
180.0000 mg | ORAL_CAPSULE | Freq: Every day | ORAL | Status: DC
  Administered 2019-02-06: 180 mg via ORAL
  Filled 2019-02-06 (×2): qty 1

## 2019-02-06 MED ORDER — MAGNESIUM OXIDE 400 (241.3 MG) MG PO TABS
400.0000 mg | ORAL_TABLET | Freq: Two times a day (BID) | ORAL | Status: DC
  Administered 2019-02-06 – 2019-02-09 (×7): 400 mg via ORAL
  Filled 2019-02-06 (×8): qty 1

## 2019-02-06 NOTE — Progress Notes (Signed)
Progress Note  Patient Name: Cassandra Allen [facility] Date of Encounter: 02/06/2019  Primary Cardiologist: Thompson Grayer, MD   Subjective   No dyspnea; sharp CP lasting 1-2 sec  Inpatient Medications    Scheduled Meds: . amoxicillin-clavulanate  1 tablet Oral Q12H  . apixaban  5 mg Oral BID  . diltiazem  120 mg Oral Daily  . feeding supplement (ENSURE ENLIVE)  237 mL Oral Q24H  . ferrous sulfate  325 mg Oral BID WC  . furosemide  20 mg Intravenous Daily  . insulin aspart  0-9 Units Subcutaneous TID WC  . insulin glargine  12 Units Subcutaneous QHS  . Melatonin  6 mg Oral QHS  . oxybutynin  10 mg Oral Daily  . sertraline  100 mg Oral Daily  . simvastatin  20 mg Oral QHS  . sodium chloride flush  5 mL Intracatheter Q8H   Continuous Infusions: . sodium chloride 10 mL/hr at 02/06/19 0600   PRN Meds: sodium chloride, acetaminophen, alum & mag hydroxide-simeth, methocarbamol, Muscle Rub, ondansetron, sodium chloride flush, traMADol   Vital Signs    Vitals:   02/05/19 2017 02/05/19 2046 02/05/19 2054 02/06/19 0449  BP: (!) 141/73 127/84  130/71  Pulse:  (!) 118    Resp:  (!) 31 (!) 27 19  Temp:  98.6 F (37 C)  98.1 F (36.7 C)  TempSrc:  Oral  Oral  SpO2:  100%  100%  Weight:    74.8 kg  Height:        Intake/Output Summary (Last 24 hours) at 02/06/2019 0740 Last data filed at 02/06/2019 0600 Gross per 24 hour  Intake 2486.32 ml  Output 3020 ml  Net -533.68 ml   Last 3 Weights 02/06/2019 02/05/2019 02/03/2019  Weight (lbs) 164 lb 14.5 oz 171 lb 4.8 oz 172 lb 12.8 oz  Weight (kg) 74.8 kg 77.7 kg 78.382 kg      Telemetry    Atrial fibrillation rate upper normal to mildly elevated - Personally Reviewed  Physical Exam   GEN: No acute distress.   Neck: No JVD Cardiac: irregular and tachycardic Respiratory: Clear to auscultation bilaterally. GI: S/P abdominal surgery, tender to palpation MS: No edema Neuro:  Nonfocal  Psych: Normal affect   Labs     Chemistry Recent Labs  Lab 01/31/19 1301  02/03/19 0129 02/05/19 0444 02/06/19 0247  NA 142   < > 137 137 137  K 4.0   < > 4.1 4.2 4.8  CL 107   < > 104 106 100  CO2 24   < > 26 28 27   GLUCOSE 123*   < > 188* 144* 211*  BUN 19   < > 17 15 16   CREATININE 0.64   < > 0.91 0.70 0.72  CALCIUM 8.5*   < > 7.6* 7.8* 8.2*  PROT 5.3*  --   --   --   --   ALBUMIN 1.6*  --   --   --   --   AST 11*  --   --   --   --   ALT 15  --   --   --   --   ALKPHOS 234*  --   --   --   --   BILITOT 0.5  --   --   --   --   GFRNONAA >60   < > >60 >60 >60  GFRAA >60   < > >60 >60 >60  ANIONGAP 11   < >  7 3* 10   < > = values in this interval not displayed.     Hematology Recent Labs  Lab 02/03/19 0129 02/05/19 0444 02/06/19 0247  WBC 7.3 9.2 10.2  RBC 3.01* 3.06* 3.08*  HGB 8.0* 8.0* 8.6*  HCT 27.5* 28.6* 28.4*  MCV 91.4 93.5 92.2  MCH 26.6 26.1 27.9  MCHC 29.1* 28.0* 30.3  RDW 15.9* 17.1* 17.4*  PLT 362 355 375    Cardiac Enzymes Recent Labs  Lab 02/05/19 0724 02/05/19 1226 02/05/19 1757  TROPONINI 0.04* 0.05* 0.06*     Radiology    Dg Chest Port 1 View  Result Date: 02/05/2019 CLINICAL DATA:  Onset chest pain this morning. Atrial fibrillation with rapid ventricular response. EXAM: PORTABLE CHEST 1 VIEW COMPARISON:  Single-view of the chest 12/31/2018. PA and lateral chest 07/10/2018. FINDINGS: There is a small to moderate left pleural effusion and basilar airspace disease. No right effusion. The right lung is clear. Cardiomegaly is identified. Aortic atherosclerosis is seen. Right IJ approach Port-A-Cath is unchanged since the most recent exam. IMPRESSION: Small to moderate left pleural effusion and basilar airspace disease which could be atelectasis or pneumonia. Cardiomegaly without edema. Electronically Signed   By: Inge Rise M.D.   On: 02/05/2019 09:44    Patient Profile     73 year old female with past medical history of permanent atrial fibrillation, metastatic  colon cancer, hypertension, diabetes mellitus, chronic kidney disease for evaluation of atrial fibrillation and chest pain.  Patient admitted with abdominal pain and CT revealed intra-abdominal abscess.  She had IR drain placed.  Had episode of CP; also with permanent atrial fibrillation; rate elevated and cardiology asked to evaluate.  Assessment & Plan    1 chest pain-symptoms are atypical.  Enzymes are flat and not consistent with acute coronary syndrome.  I would not plan further ischemia evaluation particularly in light of metastatic colon cancer and ongoing intra-abdominal process including abscess.   2 permanent atrial fibrillation-patient remains in permanent atrial fibrillation.  Heart rate high normal to mildly elevated.  Increase Cardizem to 180 mg daily.  Continue apixaban.  Mild elevation in heart rate likely driven by acute illness.    3 abdominal abscess-drain in place.  Per primary care.  4 metastatic colon cancer  5 acute diastolic congestive heart failure-patient mildly volume overloaded.  Will continue IV Lasix today and likely discontinue tomorrow.  For questions or updates, please contact [place] Please consult [email or web address] for contact info under        Signed, Kirk Ruths, MD  02/06/2019, 7:40 AM

## 2019-02-06 NOTE — Final Consult Note (Signed)
Consultant Final Sign-Off Note    Assessment/Final recommendations  Cassandra Allen is a 73 y.o. female followed by me for large intra-abdominal abscess  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 Hassell Done Large intra-abdominal abscess - Successful percutaneous drainage3/20 - Culture: FEW ENTEROCOCCUS FAECALIS , ABUNDANT PREVOTELLA BIVIA, BETA LACTAMASE POSITIVE. Sensitive to ampicillin. Transitioned to oral Augmentin for 7 days (3/25 - 4/1) - Continue drain - The patient is tolerating diet, pain controlled, afebrile, wbc normalized. Patient will need follow up with drain clinic. She has scheduled appointment for follow up with Dr. Hassell Done (information on AVS). From surgical standpoint, patient is doing well. Surgery will sign off.   Wound care (if applicable): Per IR   Diet at discharge: per primary team   Activity at discharge: per primary team   Follow-up appointment:   [place] per IR.  Dr. Hassell Done   Pending results:  Unresulted Labs (From admission, onward)    Start     Ordered   02/07/19 7026  Basic metabolic panel  Tomorrow morning,   R    Question:  Specimen collection method  Answer:  Lab=Lab collect   02/06/19 0752           Medication recommendations: Continue oral Augmentin for 7 days (3/25 - 4/1)    Other recommendations:     Thank you for allowing [place] to participate in the care of your patient!  Please consult [place] again if you have further needs for your patient.  Barth Kirks [facility] 02/06/2019 9:51 AM  Subjective   Doing well today. Abdominal pain similar to yesterday. No N/V. Tolerating diet. Developed A. Fib with RVR. Her Cardizem was increased. Reports she is going to stay overnight to monitor this per primary team.   Objective  Vital signs in last 24 hours: Temp:  [97.9 F (36.6 C)-98.6 F (37 C)] 98 F (36.7 C) (03/26  0849) Pulse Rate:  [52-130] 52 (03/26 0849) Resp:  [19-31] 23 (03/26 0849) BP: (108-141)/(63-84) 129/73 (03/26 0849) SpO2:  [99 %-100 %] 99 % (03/26 0849) Weight:  [74.8 kg] 74.8 kg (03/26 0449)  General: Gen: Alert, NAD, pleasant Cardio: tachy 130's while in the room. Irregular, irregular.  Pulm: effort normal Abd: Soft, NT/ND, +BS,JP with purulent drainage (20cc overnight), colostomy functioning VZC:HYIFOY soft and nontender Skin: no rashes noted, warm and dry   Pertinent labs and Studies: Recent Labs    02/05/19 0444 02/06/19 0247  WBC 9.2 10.2  HGB 8.0* 8.6*  HCT 28.6* 28.4*   BMET Recent Labs    02/05/19 0444 02/06/19 0247  NA 137 137  K 4.2 4.8  CL 106 100  CO2 28 27  GLUCOSE 144* 211*  BUN 15 16  CREATININE 0.70 0.72  CALCIUM 7.8* 8.2*   No results for input(s): LABURIN in the last 72 hours. Results for orders placed or performed during the hospital encounter of 01/31/19  Blood culture (routine x 2)     Status: None   Collection Time: 01/31/19  1:09 PM  Result Value Ref Range Status   Specimen Description BLOOD RIGHT ARM  Final   Special Requests   Final    BOTTLES DRAWN AEROBIC AND ANAEROBIC Blood Culture adequate volume   Culture   Final    NO GROWTH 5 DAYS Performed at [facility], [place] [address], [place], [place] [address]    Report Status 02/05/2019 FINAL  Final  Blood culture (routine x 2)     Status: None   Collection Time: 01/31/19  1:14 PM  Result Value Ref Range Status   Specimen Description BLOOD LEFT ANTECUBITAL  Final   Special Requests   Final    BOTTLES DRAWN AEROBIC AND ANAEROBIC Blood Culture adequate volume   Culture   Final    NO GROWTH 5 DAYS Performed at [facility], [address], [place], [place] [address]    Report Status 02/05/2019 FINAL  Final  Aerobic/Anaerobic Culture (surgical/deep wound)     Status: None   Collection Time: 01/31/19  5:41 PM  Result Value Ref Range Status   Specimen Description  ABDOMEN  Final   Special Requests NONE  Final   Gram Stain   Final    ABUNDANT WBC PRESENT,BOTH PMN AND MONONUCLEAR MODERATE GRAM POSITIVE COCCI FEW GRAM VARIABLE ROD    Culture   Final    FEW ENTEROCOCCUS FAECALIS ABUNDANT PREVOTELLA BIVIA BETA LACTAMASE POSITIVE Performed at [facility], [place] [address] [place], [place] [address]    Report Status 02/04/2019 FINAL  Final   Organism ID, Bacteria ENTEROCOCCUS FAECALIS  Final      Susceptibility   Enterococcus faecalis - MIC*    AMPICILLIN <=2 SENSITIVE Sensitive     VANCOMYCIN 1 SENSITIVE Sensitive     GENTAMICIN SYNERGY SENSITIVE Sensitive     * FEW ENTEROCOCCUS FAECALIS    Imaging: No results found.

## 2019-02-06 NOTE — Care Management Important Message (Signed)
Important Message  Patient Details  Name: Cassandra Allen MRN: [id number] Date of Birth: 1945-11-21   Medicare Important Message Given:  Yes    Orbie Pyo 02/06/2019, 3:38 PM

## 2019-02-06 NOTE — Progress Notes (Signed)
PROGRESS NOTE    Cassandra Allen  LKG:[id number] DOB: [birth year] DOA: 01/31/2019 PCP: [name], MD    Brief Narrative: 73 year old with past medical history significant for A. fib on Eliquis, hypertension, diabetes, colon cancer, metastatic liver, status post exploratory laparotomy with partial colectomy and transverse  end colostomy on 12/27/2018 patient was discharged to skilled nursing facility she presented to the hospital on 3/20 complaining of generalized weakness, abdominal pain.  CT abdomen was performed which showed significant intra-abdominal abscess.  Patient was a started on Zosyn and general surgery was consulted who recommended drainage by IR.   Assessment & Plan:   Active Problems:   Essential (primary) hypertension   Insulin dependent diabetes mellitus (HCC)   Atrial fibrillation, chronic   Anticoagulant long-term use   Cancer of sigmoid colon    Postoperative intra-abdominal abscess   1-Intra-abdominal abscess; status post recent colectomy and transverse end colostomy on 12/27/2018 Status post IR drain on 3/20. Blood cultures no growth today. Drain culture grew Enterococcus faecalis that is sensitive to ampicillin.  Patient was changed to oral antibiotics per general surgery recommendation. She will need to follow-up with drain clinic. Afebrile, WBC normal.   A. fib with RVR: Eliquis was initially on hold due to anticipated procedure. She is now back on Eliquis. Overnight she developed A. fib with RVR.  She received 5 mg IV Cardizem and her morning dose of Cardizem.  Appreciate cardiology assistance. Plan to increase Cardizem to 180 mg daily.   Chest pain, shortness of breath: Suspect related to A. fib RVR. Chest pain is not pleuritic. Patient also on eliquis.  Chest x-ray order: Show atelectasis and right side pleural effusion. Incentive spirometry ordered. Troponin mildly elevated in the setting of A. fib RVR.  Cardiology has been consulted.   Hypomagnesemia;  Received IV magnesium.  Start oral supplement.   Chronic diastolic heart failure: Started on IV lasix.  Appreciate cardiology help.   Insulin-dependent type 2 diabetes: Continue with Lantus and a sliding scale insulin.  Metastatic  Adenocarcinoma: Colon cancer.  Colon cancer with no metastasis to liver. Follows with Dr. Annamaria Boots.  Failure to drive: Needed SNF at discharge.  Normocytic anemia: Follow hemoglobin.   Estimated body mass index is 30.16 kg/m as calculated from the following:   Height as of this encounter: 5\' 2"  (1.575 m).   Weight as of this encounter: 74.8 kg.   DVT prophylaxis: Eliquis Code Status: Full code Family Communication: Care discussed with patient Disposition Plan: SNF when heart rate control  Consultants:   General surgery  Cardiology   Procedures:  US guided drainage of abdominal abscess. 70F biliary drain.   Antimicrobials:  Zosyn subsequently Augmentin  Subjective: She report intermittent sharp chest pain. Currently on my evaluation she is chest pain free. She denies dyspnea.  She is complaining of back pain.   Objective: Vitals:   02/05/19 2046 02/06/19 0449 02/06/19 0843 02/06/19 0849  BP:  130/71 129/73 129/73  Pulse: (!) 118  (!) 130 (!) 52  Resp:  19 (!) 21 (!) 23  Temp: 98.6 F (37 C) 98.1 F (36.7 C)  98 F (36.7 C)  TempSrc: Oral Oral  Oral  SpO2: 100% 100%  99%  Weight:  74.8 kg    Height:        Intake/Output Summary (Last 24 hours) at 02/06/2019 0949 Last data filed at 02/06/2019 0600 Gross per 24 hour  Intake 2481.32 ml  Output 2820 ml  Net -338.68 ml   [employer]  02/03/19 0035 02/05/19 0814 02/06/19 0449  Weight: 78.4 kg 77.7 kg 74.8 kg    Examination:  General exam: NAD Respiratory system: Crackles bases.  Cardiovascular system: S 1, S 2 IRR Gastrointestinal system: BS present, soft, colostomy with bag, stool in bag. Drain in Airline pilot.  Central nervous system: alert  Extremities: Trace edema Skin: No rashes.    Data Reviewed: I have personally reviewed following labs and imaging studies  CBC: Recent Labs  Lab 01/31/19 1301 02/01/19 0303 02/02/19 0318 02/03/19 0129 02/05/19 0444 02/06/19 0247  WBC 8.6 5.9 6.1 7.3 9.2 10.2  NEUTROABS 7.0  --  4.4  --  6.9  --   HGB 8.8* 8.4* 8.0* 8.0* 8.0* 8.6*  HCT 28.9* 27.3* 25.9* 27.5* 28.6* 28.4*  MCV 91.5 90.1 90.2 91.4 93.5 92.2  PLT 366 327 346 362 355 163   Basic Metabolic Panel: Recent Labs  Lab 02/01/19 0303 02/02/19 0318 02/03/19 0129 02/05/19 0444 02/06/19 0247  NA 137 138 137 137 137  K 3.9 3.3* 4.1 4.2 4.8  CL 103 106 104 106 100  CO2 23 26 26 28 27   GLUCOSE 110* 248* 188* 144* 211*  BUN 16 14 17 15 16   CREATININE 0.60 0.79 0.91 0.70 0.72  CALCIUM 8.2* 7.5* 7.6* 7.8* 8.2*  MG  --  1.3* 2.0 1.6* 1.8   GFR: Estimated Creatinine Clearance: 60.2 mL/min (by C-G formula based on SCr of 0.72 mg/dL). Liver Function Tests: Recent Labs  Lab 01/31/19 1301  AST 11*  ALT 15  ALKPHOS 234*  BILITOT 0.5  PROT 5.3*  ALBUMIN 1.6*   No results for input(s): LIPASE, AMYLASE in the last 168 hours. No results for input(s): AMMONIA in the last 168 hours. Coagulation Profile: Recent Labs  Lab 01/31/19 1600  INR 2.6*   Cardiac Enzymes: Recent Labs  Lab 02/05/19 0724 02/05/19 1226 02/05/19 1757  TROPONINI 0.04* 0.05* 0.06*   BNP (last 3 results) No results for input(s): PROBNP in the last 8760 hours. HbA1C: No results for input(s): HGBA1C in the last 72 hours. CBG: Recent Labs  Lab 02/05/19 0820 02/05/19 1147 02/05/19 1709 02/05/19 2054 02/06/19 0740  GLUCAP 94 197* 214* 167* 167*   Lipid Profile: No results for input(s): CHOL, HDL, LDLCALC, TRIG, CHOLHDL, LDLDIRECT in the last 72 hours. Thyroid Function Tests: No results for input(s): TSH, T4TOTAL, FREET4, T3FREE, THYROIDAB in the last 72 hours. Anemia Panel: No results for input(s): VITAMINB12, FOLATE, FERRITIN, TIBC,  IRON, RETICCTPCT in the last 72 hours. Sepsis Labs: Recent Labs  Lab 01/31/19 1310 01/31/19 1427  LATICACIDVEN 1.0 0.7    Recent Results (from the past 240 hour(s))  Blood culture (routine x 2)     Status: None   Collection Time: 01/31/19  1:09 PM  Result Value Ref Range Status   Specimen Description BLOOD RIGHT ARM  Final   Special Requests   Final    BOTTLES DRAWN AEROBIC AND ANAEROBIC Blood Culture adequate volume   Culture   Final    NO GROWTH 5 DAYS Performed at [facility], [address] [place], [place] [address]    Report Status 02/05/2019 FINAL  Final  Blood culture (routine x 2)     Status: None   Collection Time: 01/31/19  1:14 PM  Result Value Ref Range Status   Specimen Description BLOOD LEFT ANTECUBITAL  Final   Special Requests   Final    BOTTLES DRAWN AEROBIC AND ANAEROBIC Blood Culture adequate volume  Culture   Final    NO GROWTH 5 DAYS Performed at [facility], [place] [address], [place], [place] [address]    Report Status 02/05/2019 FINAL  Final  Aerobic/Anaerobic Culture (surgical/deep wound)     Status: None   Collection Time: 01/31/19  5:41 PM  Result Value Ref Range Status   Specimen Description ABDOMEN  Final   Special Requests NONE  Final   Gram Stain   Final    ABUNDANT WBC PRESENT,BOTH PMN AND MONONUCLEAR MODERATE GRAM POSITIVE COCCI FEW GRAM VARIABLE ROD    Culture   Final    FEW ENTEROCOCCUS FAECALIS ABUNDANT PREVOTELLA BIVIA BETA LACTAMASE POSITIVE Performed at [facility], [place] [address], [place], [place] [address]    Report Status 02/04/2019 FINAL  Final   Organism ID, Bacteria ENTEROCOCCUS FAECALIS  Final      Susceptibility   Enterococcus faecalis - MIC*    AMPICILLIN <=2 SENSITIVE Sensitive     VANCOMYCIN 1 SENSITIVE Sensitive     GENTAMICIN SYNERGY SENSITIVE Sensitive     * FEW ENTEROCOCCUS FAECALIS         Radiology Studies: Dg Chest Port 1 View  Result Date: 02/05/2019 CLINICAL DATA:  Onset  chest pain this morning. Atrial fibrillation with rapid ventricular response. EXAM: PORTABLE CHEST 1 VIEW COMPARISON:  Single-view of the chest 12/31/2018. PA and lateral chest 07/10/2018. FINDINGS: There is a small to moderate left pleural effusion and basilar airspace disease. No right effusion. The right lung is clear. Cardiomegaly is identified. Aortic atherosclerosis is seen. Right IJ approach Port-A-Cath is unchanged since the most recent exam. IMPRESSION: Small to moderate left pleural effusion and basilar airspace disease which could be atelectasis or pneumonia. Cardiomegaly without edema. Electronically Signed   By: Inge Rise M.D.   On: 02/05/2019 09:44        Scheduled Meds: . amoxicillin-clavulanate  1 tablet Oral Q12H  . apixaban  5 mg Oral BID  . diltiazem  180 mg Oral Daily  . feeding supplement (ENSURE ENLIVE)  237 mL Oral Q24H  . ferrous sulfate  325 mg Oral BID WC  . furosemide  20 mg Intravenous Daily  . insulin aspart  0-9 Units Subcutaneous TID WC  . insulin glargine  12 Units Subcutaneous QHS  . Melatonin  6 mg Oral QHS  . oxybutynin  10 mg Oral Daily  . sertraline  100 mg Oral Daily  . simvastatin  20 mg Oral QHS  . sodium chloride flush  5 mL Intracatheter Q8H   Continuous Infusions: . sodium chloride 10 mL/hr at 02/06/19 0600     LOS: 6 days    Time spent: 35 minutes     Elmarie Shiley, MD [facility] Pager [phone]  If 7PM-7AM, please contact night-coverage [email or web address] Password [facility] 02/06/2019, 9:49 AM

## 2019-02-06 NOTE — Progress Notes (Addendum)
Physical Therapy Treatment Patient Details Name: Cassandra Allen MRN: [id number] DOB: 08-12-1946 Today's Date: 02/06/2019    History of Present Illness Pt is a 73 y.o. female admitted from SNF on 01/31/19 with weakness and intra-abdominal fluid collection abscess. S/p drain placement 3/21. Also with afib with RVR and chest pain. Of note, recent admission to [facility] s/p exlap with partial colectomy and transverse colostomy 12/27/18 with d/c to SNF. PMH includes afib on Eliquis, HTN, DM, metastatic colon CA (mets to liver).   PT Comments    Pt slowly progressing with mobility. Today's session focused on standing balance; pt requiring RW and mod-maxA+2 for this. Limited by generalized weakness and significant anxiety/fearful of falling. HR up to 175 during last standing trial, returning to 120s with seated rest; attempted educ regarding relaxation/deep breathing techniques. Pt remains motivated to participate. Continue to recommend SNF-level therapies.    Follow Up Recommendations  SNF     Equipment Recommendations  None recommended by PT    Recommendations for Other Services       Precautions / Restrictions Precautions Precautions: Fall Precaution Comments: Recent abdominal sx with new colostomy, L abdominal JP drain, RUE port Restrictions Weight Bearing Restrictions: No    Mobility  Bed Mobility Overal bed mobility: Needs Assistance Bed Mobility: Supine to Sit     Supine to sit: Mod assist;HOB elevated     General bed mobility comments: ModA for trunk elevation and scoot hips to EOB; increased time and effort  Transfers Overall transfer level: Needs assistance Equipment used: Rolling walker (2 wheeled) Transfers: Sit to/from Stand Sit to Stand: Mod assist;+2 physical assistance;Max assist         General transfer comment: Pt stood from bed x1 and chair x3 with RW and mod-maxA+2. Pt with high anxiety/fearful of falling; heavy reliance on BUE support. HR 120s up to 175  with standing  Ambulation/Gait Ambulation/Gait assistance: Mod assist;+2 safety/equipment Gait Distance (Feet): 1 Feet Assistive device: Rolling walker (2 wheeled) Gait Pattern/deviations: Step-to pattern;Leaning posteriorly;Trunk flexed     General Gait Details: Pivotal steps from bed to recliner with modA+2; difficulty translating weight anteriorly, consistent assist to prevent posterior LOB. Further mobility deferred secondary to increased HR with static standing   Stairs             Wheelchair Mobility    Modified Rankin (Stroke Patients Only)       Balance Overall balance assessment: Needs assistance Sitting-balance support: Bilateral upper extremity supported;Feet supported Sitting balance-Leahy Scale: Fair     Standing balance support: Bilateral upper extremity supported;During functional activity Standing balance-Leahy Scale: Poor Standing balance comment: Reliant on UE support and external assist                            Cognition Arousal/Alertness: Awake/alert Behavior During Therapy: Anxious Overall Cognitive Status: Within Functional Limits for tasks assessed                                 General Comments: HIGH anxiety/fear of falling with mobility      Exercises      General Comments General comments (skin integrity, edema, etc.): RN notified of tachycardia      Pertinent Vitals/Pain Pain Assessment: No/denies pain    Home Living                      Prior Function  PT Goals (current goals can now be found in the care plan section) Acute Rehab PT Goals Patient Stated Goal: to get to where she can walk PT Goal Formulation: With patient Time For Goal Achievement: 02/16/19 Potential to Achieve Goals: Fair Progress towards PT goals: Progressing toward goals    Frequency    Min 2X/week      PT Plan Current plan remains appropriate    Co-evaluation              AM-PAC PT "6  Clicks" Mobility   Outcome Measure  Help needed turning from your back to your side while in a flat bed without using bedrails?: A Lot Help needed moving from lying on your back to sitting on the side of a flat bed without using bedrails?: A Lot Help needed moving to and from a bed to a chair (including a wheelchair)?: A Lot Help needed standing up from a chair using your arms (e.g., wheelchair or bedside chair)?: Total Help needed to walk in hospital room?: A Lot Help needed climbing 3-5 steps with a railing? : Total 6 Click Score: 10    End of Session Equipment Utilized During Treatment: Gait belt Activity Tolerance: Patient tolerated treatment well Patient left: in chair;with call bell/phone within reach Nurse Communication: Mobility status;Need for lift equipment PT Visit Diagnosis: Muscle weakness (generalized) (M62.81);Difficulty in walking, not elsewhere classified (R26.2)     Time: 1351-1410 PT Time Calculation (min) (ACUTE ONLY): 19 min  Charges:  $Gait Training: 8-22 mins                    Mabeline Caras, PT, DPT Acute Rehabilitation Services  Pager [phone] Office [place] 02/06/2019, 2:35 PM

## 2019-02-07 LAB — GLUCOSE, CAPILLARY
Glucose-Capillary: 133 mg/dL — ABNORMAL HIGH (ref 70–99)
Glucose-Capillary: 393 mg/dL — ABNORMAL HIGH (ref 70–99)
Glucose-Capillary: 315 mg/dL — ABNORMAL HIGH (ref 70–99)
Glucose-Capillary: 252 mg/dL — ABNORMAL HIGH (ref 70–99)

## 2019-02-07 LAB — BASIC METABOLIC PANEL
Anion gap: 9 (ref 5–15)
BUN: 21 mg/dL (ref 8–23)
CO2: 30 mmol/L (ref 22–32)
Calcium: 8.7 mg/dL — ABNORMAL LOW (ref 8.9–10.3)
Chloride: 100 mmol/L (ref 98–111)
Creatinine, Ser: 1.09 mg/dL — ABNORMAL HIGH (ref 0.44–1.00)
GFR calc Af Amer: 59 mL/min — ABNORMAL LOW (ref >60)
GFR calc non Af Amer: 51 mL/min — ABNORMAL LOW (ref >60)
Glucose, Bld: 161 mg/dL — ABNORMAL HIGH (ref 70–99)
Potassium: 4.8 mmol/L (ref 3.5–5.1)
Sodium: 139 mmol/L (ref 135–145)

## 2019-02-07 MED ORDER — DILTIAZEM HCL 60 MG PO TABS
90.0000 mg | ORAL_TABLET | Freq: Four times a day (QID) | ORAL | Status: DC
  Administered 2019-02-07: 90 mg via ORAL
  Filled 2019-02-07: qty 2

## 2019-02-07 MED ORDER — DILTIAZEM HCL 60 MG PO TABS
60.0000 mg | ORAL_TABLET | Freq: Four times a day (QID) | ORAL | Status: DC
  Administered 2019-02-07 – 2019-02-09 (×6): 60 mg via ORAL
  Filled 2019-02-07 (×6): qty 1

## 2019-02-07 MED ORDER — DILTIAZEM HCL 60 MG PO TABS
30.0000 mg | ORAL_TABLET | Freq: Once | ORAL | Status: AC
  Administered 2019-02-07: 30 mg via ORAL
  Filled 2019-02-07: qty 1

## 2019-02-07 MED ORDER — DILTIAZEM HCL 60 MG PO TABS
60.0000 mg | ORAL_TABLET | Freq: Four times a day (QID) | ORAL | Status: DC
  Administered 2019-02-07: 60 mg via ORAL
  Filled 2019-02-07: qty 1

## 2019-02-07 NOTE — Progress Notes (Signed)
PROGRESS NOTE    Cassandra Allen  KCM:[id number] DOB: January 15, 1946 DOA: 01/31/2019 PCP: [name], MD    Brief Narrative: 73 year old with past medical history significant for A. fib on Eliquis, hypertension, diabetes, colon cancer, metastatic liver, status post exploratory laparotomy with partial colectomy and transverse  end colostomy on 12/27/2018 patient was discharged to skilled nursing facility she presented to the hospital on 3/20 complaining of generalized weakness, abdominal pain.  CT abdomen was performed which showed significant intra-abdominal abscess. Patient was a started on Zosyn and general surgery was consulted who recommended drainage by IR.   Assessment & Plan:   Active Problems:   Essential (primary) hypertension   Insulin dependent diabetes mellitus (HCC)   Atrial fibrillation, chronic   Anticoagulant long-term use   Cancer of sigmoid colon    Postoperative intra-abdominal abscess   1-Intra-abdominal abscess; status post recent colectomy and transverse end colostomy on 12/27/2018 Status post IR drain on 3/20. Blood cultures no growth. Final.  Drain culture grew Enterococcus faecalis that is sensitive to ampicillin.  Patient was changed to oral antibiotics per general surgery recommendation. She will need to follow-up with drain clinic. Afebrile, WBC normal.  Surgery recommending Augmentin for 7 days. Last dose 4-1.  A. fib with RVR: Eliquis was initially on hold due to anticipated procedure. Continue with  Eliquis. Overnight she developed A. fib with RVR.  She received 5 mg IV Cardizem and her morning dose of Cardizem.  Appreciate cardiology assistance. HR elevated this am. Transient chest pain. Cardizem change to 60 mg every 6 hours.   Chest pain;  Suspect related to A. fib RVR. Chest pain is not pleuritic. Patient also on eliquis.  Chest x-ray order: Show atelectasis and right side pleural effusion. Incentive spirometry ordered. Troponin mildly  elevated in the setting of A. fib RVR.  Cardiology has been consulted. Mild chest pain this am, and nausea. Support care.   Hypomagnesemia;  Received IV magnesium.  Start oral supplement.   Chronic diastolic heart failure: Started on IV lasix.  Appreciate cardiology help.  Monitor renal function. Stable at this time.   Insulin-dependent type 2 diabetes: Continue with Lantus and a sliding scale insulin.  Metastatic  Adenocarcinoma: Colon cancer.  Colon cancer with no metastasis to liver. Follows with Dr. Annamaria Boots.  Back pain; check chest x ray.   Failure to drive: Needed SNF at discharge.  Severe caloric  malnutrition; in context of malignancy.  Albumin at 1.6   Normocytic anemia: Follow hemoglobin.   Estimated body mass index is 30.12 kg/m as calculated from the following:   Height as of this encounter: 5\' 2"  (1.575 m).   Weight as of this encounter: 74.7 kg.   DVT prophylaxis: Eliquis Code Status: Full code Family Communication: Care discussed with patient Disposition Plan: SNF when heart rate control  Consultants:   General surgery  Cardiology   Procedures:  US guided drainage of abdominal abscess. 65F biliary drain.   Antimicrobials:  Zosyn subsequently Augmentin  Subjective: Complaining of nausea this am.  Mild chest pain. Back pain and abdominal pain.    Objective: Vitals:   02/06/19 1539 02/06/19 2019 02/07/19 0623 02/07/19 0755  BP: 124/60 119/78 (!) 135/120 136/86  Pulse: 91 92 91   Resp: (!) 23 (!) 24 19   Temp: 98.2 F (36.8 C) 98.7 F (37.1 C) 98.3 F (36.8 C)   TempSrc: Oral Oral Oral   SpO2: 96% 96% 96%   Weight:   74.7 kg   Height:  Intake/Output Summary (Last 24 hours) at 02/07/2019 0857 Last data filed at 02/07/2019 0650 Gross per 24 hour  Intake 492 ml  Output 2447 ml  Net -1955 ml   Filed Weights   02/05/19 0814 02/06/19 0449 02/07/19 0623  Weight: 77.7 kg 74.8 kg 74.7 kg    Examination:  General exam: NAD  Respiratory system:Crackles bases.  Cardiovascular system: S 1, S 2 IRR Gastrointestinal system: BS present, soft, colostomy in place bag with stool, drain in left quadrant.  Central nervous system: Alert, follows command.  Extremities: trace edema Skin: No rashes.    Data Reviewed: I have personally reviewed following labs and imaging studies  CBC: Recent Labs  Lab 01/31/19 1301 02/01/19 0303 02/02/19 0318 02/03/19 0129 02/05/19 0444 02/06/19 0247  WBC 8.6 5.9 6.1 7.3 9.2 10.2  NEUTROABS 7.0  --  4.4  --  6.9  --   HGB 8.8* 8.4* 8.0* 8.0* 8.0* 8.6*  HCT 28.9* 27.3* 25.9* 27.5* 28.6* 28.4*  MCV 91.5 90.1 90.2 91.4 93.5 92.2  PLT 366 327 346 362 355 979   Basic Metabolic Panel: Recent Labs  Lab 02/02/19 0318 02/03/19 0129 02/05/19 0444 02/06/19 0247 02/07/19 0629  NA 138 137 137 137 139  K 3.3* 4.1 4.2 4.8 4.8  CL 106 104 106 100 100  CO2 26 26 28 27 30   GLUCOSE 248* 188* 144* 211* 161*  BUN 14 17 15 16 21   CREATININE 0.79 0.91 0.70 0.72 1.09*  CALCIUM 7.5* 7.6* 7.8* 8.2* 8.7*  MG 1.3* 2.0 1.6* 1.8  --    GFR: Estimated Creatinine Clearance: 44.1 mL/min (A) (by C-G formula based on SCr of 1.09 mg/dL (H)). Liver Function Tests: Recent Labs  Lab 01/31/19 1301  AST 11*  ALT 15  ALKPHOS 234*  BILITOT 0.5  PROT 5.3*  ALBUMIN 1.6*   No results for input(s): LIPASE, AMYLASE in the last 168 hours. No results for input(s): AMMONIA in the last 168 hours. Coagulation Profile: Recent Labs  Lab 01/31/19 1600  INR 2.6*   Cardiac Enzymes: Recent Labs  Lab 02/05/19 0724 02/05/19 1226 02/05/19 1757  TROPONINI 0.04* 0.05* 0.06*   BNP (last 3 results) No results for input(s): PROBNP in the last 8760 hours. HbA1C: No results for input(s): HGBA1C in the last 72 hours. CBG: Recent Labs  Lab 02/06/19 0740 02/06/19 1220 02/06/19 1656 02/06/19 2104 02/07/19 0752  GLUCAP 167* 185* 192* 215* 133*   Lipid Profile: No results for input(s): CHOL, HDL,  LDLCALC, TRIG, CHOLHDL, LDLDIRECT in the last 72 hours. Thyroid Function Tests: No results for input(s): TSH, T4TOTAL, FREET4, T3FREE, THYROIDAB in the last 72 hours. Anemia Panel: No results for input(s): VITAMINB12, FOLATE, FERRITIN, TIBC, IRON, RETICCTPCT in the last 72 hours. Sepsis Labs: Recent Labs  Lab 01/31/19 1310 01/31/19 1427  LATICACIDVEN 1.0 0.7    Recent Results (from the past 240 hour(s))  Blood culture (routine x 2)     Status: None   Collection Time: 01/31/19  1:09 PM  Result Value Ref Range Status   Specimen Description BLOOD RIGHT ARM  Final   Special Requests   Final    BOTTLES DRAWN AEROBIC AND ANAEROBIC Blood Culture adequate volume   Culture   Final    NO GROWTH 5 DAYS Performed at [facility], [address], [place], [place] [address]    Report Status 02/05/2019 FINAL  Final  Blood culture (routine x 2)     Status: None   Collection Time:  01/31/19  1:14 PM  Result Value Ref Range Status   Specimen Description BLOOD LEFT ANTECUBITAL  Final   Special Requests   Final    BOTTLES DRAWN AEROBIC AND ANAEROBIC Blood Culture adequate volume   Culture   Final    NO GROWTH 5 DAYS Performed at [facility], [address], [place], [place] [address]    Report Status 02/05/2019 FINAL  Final  Aerobic/Anaerobic Culture (surgical/deep wound)     Status: None   Collection Time: 01/31/19  5:41 PM  Result Value Ref Range Status   Specimen Description ABDOMEN  Final   Special Requests NONE  Final   Gram Stain   Final    ABUNDANT WBC PRESENT,BOTH PMN AND MONONUCLEAR MODERATE GRAM POSITIVE COCCI FEW GRAM VARIABLE ROD    Culture   Final    FEW ENTEROCOCCUS FAECALIS ABUNDANT PREVOTELLA BIVIA BETA LACTAMASE POSITIVE Performed at [facility], [place] [address], [place], [place] [address]    Report Status 02/04/2019 FINAL  Final   Organism ID, Bacteria ENTEROCOCCUS FAECALIS  Final      Susceptibility   Enterococcus faecalis - MIC*    AMPICILLIN  <=2 SENSITIVE Sensitive     VANCOMYCIN 1 SENSITIVE Sensitive     GENTAMICIN SYNERGY SENSITIVE Sensitive     * FEW ENTEROCOCCUS FAECALIS         Radiology Studies: Dg Chest Port 1 View  Result Date: 02/05/2019 CLINICAL DATA:  Onset chest pain this morning. Atrial fibrillation with rapid ventricular response. EXAM: PORTABLE CHEST 1 VIEW COMPARISON:  Single-view of the chest 12/31/2018. PA and lateral chest 07/10/2018. FINDINGS: There is a small to moderate left pleural effusion and basilar airspace disease. No right effusion. The right lung is clear. Cardiomegaly is identified. Aortic atherosclerosis is seen. Right IJ approach Port-A-Cath is unchanged since the most recent exam. IMPRESSION: Small to moderate left pleural effusion and basilar airspace disease which could be atelectasis or pneumonia. Cardiomegaly without edema. Electronically Signed   By: Inge Rise M.D.   On: 02/05/2019 09:44        Scheduled Meds: . amoxicillin-clavulanate  1 tablet Oral Q12H  . apixaban  5 mg Oral BID  . diltiazem  60 mg Oral Q6H  . feeding supplement (ENSURE ENLIVE)  237 mL Oral Q24H  . ferrous sulfate  325 mg Oral BID WC  . furosemide  20 mg Intravenous Daily  . insulin aspart  0-9 Units Subcutaneous TID WC  . insulin glargine  12 Units Subcutaneous QHS  . magnesium oxide  400 mg Oral BID  . Melatonin  6 mg Oral QHS  . oxybutynin  10 mg Oral Daily  . sertraline  100 mg Oral Daily  . simvastatin  20 mg Oral QHS  . sodium chloride flush  5 mL Intracatheter Q8H   Continuous Infusions: . sodium chloride 250 mL (02/07/19 0630)     LOS: 7 days    Time spent: 35 minutes     Elmarie Shiley, MD [facility] Pager [phone]  If 7PM-7AM, please contact night-coverage [email or web address] Password [facility] 02/07/2019, 8:57 AM

## 2019-02-07 NOTE — Progress Notes (Signed)
Progress Note  Patient Name: Chayanne Filippi [facility] Date of Encounter: 02/07/2019  Primary Cardiologist: Thompson Grayer, MD   Subjective   Intermittent sharp CP lasting 1-2 seconds; no dyspnea; nauseated  Inpatient Medications    Scheduled Meds:  amoxicillin-clavulanate  1 tablet Oral Q12H   apixaban  5 mg Oral BID   diltiazem  180 mg Oral Daily   feeding supplement (ENSURE ENLIVE)  237 mL Oral Q24H   ferrous sulfate  325 mg Oral BID WC   furosemide  20 mg Intravenous Daily   insulin aspart  0-9 Units Subcutaneous TID WC   insulin glargine  12 Units Subcutaneous QHS   magnesium oxide  400 mg Oral BID   Melatonin  6 mg Oral QHS   oxybutynin  10 mg Oral Daily   sertraline  100 mg Oral Daily   simvastatin  20 mg Oral QHS   sodium chloride flush  5 mL Intracatheter Q8H   Continuous Infusions:  sodium chloride 250 mL (02/07/19 0630)   PRN Meds: sodium chloride, acetaminophen, alum & mag hydroxide-simeth, methocarbamol, Muscle Rub, ondansetron, sodium chloride flush, traMADol   Vital Signs    Vitals:   02/06/19 1539 02/06/19 2019 02/07/19 0623 02/07/19 0755  BP: 124/60 119/78 (!) 135/120 136/86  Pulse: 91 92 91   Resp: (!) 23 (!) 24 19   Temp: 98.2 F (36.8 C) 98.7 F (37.1 C) 98.3 F (36.8 C)   TempSrc: Oral Oral Oral   SpO2: 96% 96% 96%   Weight:   74.7 kg   Height:        Intake/Output Summary (Last 24 hours) at 02/07/2019 0812 Last data filed at 02/07/2019 0650 Gross per 24 hour  Intake 492 ml  Output 2447 ml  Net -1955 ml   Last 3 Weights 02/07/2019 02/06/2019 02/05/2019  Weight (lbs) 164 lb 10.9 oz 164 lb 14.5 oz 171 lb 4.8 oz  Weight (kg) 74.7 kg 74.8 kg 77.7 kg      Telemetry    Atrial fibrillation rate mildly elevated - Personally Reviewed  Physical Exam   GEN: No acute distress.  WD chronically ill appearing Neck: supple Cardiac: irregular and tachycardic, no murmur Respiratory: Diminished BS bases GI: S/P abdominal surgery,  tender to palpation MS: 1+ thigh edema Neuro:  Grossly intact Psych: Normal affect   Labs    Chemistry Recent Labs  Lab 01/31/19 1301  02/05/19 0444 02/06/19 0247 02/07/19 0629  NA 142   < > 137 137 139  K 4.0   < > 4.2 4.8 4.8  CL 107   < > 106 100 100  CO2 24   < > 28 27 30   GLUCOSE 123*   < > 144* 211* 161*  BUN 19   < > 15 16 21   CREATININE 0.64   < > 0.70 0.72 1.09*  CALCIUM 8.5*   < > 7.8* 8.2* 8.7*  PROT 5.3*  --   --   --   --   ALBUMIN 1.6*  --   --   --   --   AST 11*  --   --   --   --   ALT 15  --   --   --   --   ALKPHOS 234*  --   --   --   --   BILITOT 0.5  --   --   --   --   GFRNONAA >60   < > >60 >60 51*  GFRAA >60   < > >60 >60 59*  ANIONGAP 11   < > 3* 10 9   < > = values in this interval not displayed.     Hematology Recent Labs  Lab 02/03/19 0129 02/05/19 0444 02/06/19 0247  WBC 7.3 9.2 10.2  RBC 3.01* 3.06* 3.08*  HGB 8.0* 8.0* 8.6*  HCT 27.5* 28.6* 28.4*  MCV 91.4 93.5 92.2  MCH 26.6 26.1 27.9  MCHC 29.1* 28.0* 30.3  RDW 15.9* 17.1* 17.4*  PLT 362 355 375    Cardiac Enzymes Recent Labs  Lab 02/05/19 0724 02/05/19 1226 02/05/19 1757  TROPONINI 0.04* 0.05* 0.06*     Radiology    Dg Chest Port 1 View  Result Date: 02/05/2019 CLINICAL DATA:  Onset chest pain this morning. Atrial fibrillation with rapid ventricular response. EXAM: PORTABLE CHEST 1 VIEW COMPARISON:  Single-view of the chest 12/31/2018. PA and lateral chest 07/10/2018. FINDINGS: There is a small to moderate left pleural effusion and basilar airspace disease. No right effusion. The right lung is clear. Cardiomegaly is identified. Aortic atherosclerosis is seen. Right IJ approach Port-A-Cath is unchanged since the most recent exam. IMPRESSION: Small to moderate left pleural effusion and basilar airspace disease which could be atelectasis or pneumonia. Cardiomegaly without edema. Electronically Signed   By: Inge Rise M.D.   On: 02/05/2019 09:44    Patient Profile      73 year old female with past medical history of permanent atrial fibrillation, metastatic colon cancer, hypertension, diabetes mellitus, chronic kidney disease for evaluation of atrial fibrillation and chest pain.  Patient admitted with abdominal pain and CT revealed intra-abdominal abscess.  She had IR drain placed.  Had episode of CP; also with permanent atrial fibrillation; rate elevated and cardiology asked to evaluate.  Assessment & Plan    1 chest pain-patient has intermittent sharp chest pain lasting 1 to 2 seconds.  Enzymes drawn previously and not consistent with acute coronary syndrome.  Symptoms are atypical for ischemia.   I would not plan further ischemia evaluation particularly in light of metastatic colon cancer and ongoing intra-abdominal process including abscess.   2 permanent atrial fibrillation-patient's heart rate is elevated today.  I will change Cardizem to 60 mg by mouth every 6 hours.  Titrate as needed to control heart rate and then convert to CD afterwards. Continue apixaban.  Mild elevation in heart rate likely driven by acute illness.    3 abdominal abscess-drain in place.  Per primary care.  4 metastatic colon cancer  5 acute diastolic congestive heart failure-mildly volume overloaded.  Continue Lasix and follow renal function.  For questions or updates, please contact [place] Please consult [email or web address] for contact info under        Signed, Kirk Ruths, MD  02/07/2019, 8:12 AM

## 2019-02-07 NOTE — Progress Notes (Addendum)
Patient ID: Cassandra Allen, female   DOB: [birth year], 73 y.o.   MRN: [id number]   IR round note via phone--- new regulations  Hx colon Ca Resection 12/27/18 Intra abdominal abscess Drain placed in IR 3/20  OP 95 cc yesterday-- milky yellow per RN Site is clean and dry Flushing easily   02/04/2019 FINAL    Organism ID, Bacteria ENTEROCOCCUS FAECALIS     Will follow chart Will need re CT when OP is less than 10-15 cc daily  If home with drain - will need to flush 10 cc sterile saline daily And record OP daily IR Clinic will see pt as OP if drain remains

## 2019-02-07 NOTE — Progress Notes (Signed)
Called by nursing for continued elevated heart rates 1.5 hrs after increased dose of 60 mg cardizem. Pressures are 217G systolic. I ordered a OTO 30 mg cardizem now and increased to 90 mg q 6hr. Pressure checks prior to given next cardizem dose.   Tami Lin Duke, PA-C 02/07/2019, 10:13 AM [phone]

## 2019-02-07 NOTE — Progress Notes (Signed)
Decreased cardizem back to 60 mg q6h for heart rates in the 40s. BP adequate.   Tami Lin Miyah [place], PA-C 02/07/2019, 5:51 PM [phone]

## 2019-02-07 NOTE — Progress Notes (Signed)
PT Cancellation Note  Patient Details Name: Cassandra Allen MRN: [id number] DOB: 21-Apr-1946   Cancelled Treatment:    Reason Eval/Treat Not Completed: Patient not medically ready Pt with elevated HR, holding PT session this AM per RN. Will follow.   Marguarite Arbour A Kayveon Lennartz 02/07/2019, 12:33 PM Wray Kearns, PT, DPT Acute Rehabilitation Services Pager [phone] Office [phone]

## 2019-02-07 NOTE — Progress Notes (Signed)
Physical Therapy Treatment Patient Details Name: Cassandra Allen MRN: [id number] DOB: 24-Sep-1946 Today's Date: 02/07/2019    History of Present Illness Pt is a 73 y.o. female admitted from SNF on 01/31/19 with weakness and intra-abdominal fluid collection abscess. S/p drain placement 3/21. Also with afib with RVR and chest pain. Of note, recent admission to [facility] s/p exlap with partial colectomy and transverse colostomy 12/27/18 with d/c to SNF. PMH includes afib on Eliquis, HTN, DM, metastatic colon CA (mets to liver).    PT Comments    Patient progressing well towards PT goals. Tolerated standing bouts from EOB with Max A and cues for hand placement. Pt not able to get fully upright due to fear and weakness. Tolerated transfer to chair using stedy and 1 person assist. Pt less fearful today during session and highly motivated to maximize independence. HR stable throughout. Continues to exhibit weakness and impaired mobility. Will follow.   Follow Up Recommendations  SNF     Equipment Recommendations  None recommended by PT    Recommendations for Other Services       Precautions / Restrictions Precautions Precautions: Fall Precaution Comments: Recent abdominal sx with new colostomy, L abdominal JP drain, RUE port Restrictions Weight Bearing Restrictions: No    Mobility  Bed Mobility Overal bed mobility: Needs Assistance Bed Mobility: Supine to Sit     Supine to sit: Mod assist;HOB elevated     General bed mobility comments: Uses therapist's hands to elevate trunk and scoot bottom towards EOB. Increased effort.   Transfers Overall transfer level: Needs assistance Equipment used: Rolling walker (2 wheeled) Transfers: Sit to/from Stand Sit to Stand: Max assist;From elevated surface Stand pivot transfers: Total assist;From elevated surface       General transfer comment: Assist to power to standing with cues for momentum and hand placement; not able to get fully  upright. Stood from [employer] with cues for upright/hip extension. Transferred to chair with stedy. HR stable.  Ambulation/Gait             General Gait Details: Deferred   Stairs             Wheelchair Mobility    Modified Rankin (Stroke Patients Only)       Balance Overall balance assessment: Needs assistance Sitting-balance support: Bilateral upper extremity supported;Feet supported Sitting balance-Leahy Scale: Fair     Standing balance support: Bilateral upper extremity supported;During functional activity Standing balance-Leahy Scale: Poor Standing balance comment: Reliant on UE support and external assist; Able to get almost upright but fearful of moving hand from bed to RW.                            Cognition Arousal/Alertness: Awake/alert Behavior During Therapy: Anxious Overall Cognitive Status: Within Functional Limits for tasks assessed                                 General Comments: Anxiety less today      Exercises General Exercises - Lower Extremity Ankle Circles/Pumps: AROM;Both;10 reps Long Arc Quad: AROM;Both;Seated;10 reps    General Comments General comments (skin integrity, edema, etc.): HR stable.      Pertinent Vitals/Pain Pain Assessment: No/denies pain    Home Living                      Prior Function  PT Goals (current goals can now be found in the care plan section) Progress towards PT goals: Progressing toward goals(slowly)    Frequency    Min 2X/week      PT Plan Current plan remains appropriate    Co-evaluation              AM-PAC PT "6 Clicks" Mobility   Outcome Measure  Help needed turning from your back to your side while in a flat bed without using bedrails?: A Lot Help needed moving from lying on your back to sitting on the side of a flat bed without using bedrails?: A Lot Help needed moving to and from a bed to a chair (including a wheelchair)?:  A Lot Help needed standing up from a chair using your arms (e.g., wheelchair or bedside chair)?: Total Help needed to walk in hospital room?: Total Help needed climbing 3-5 steps with a railing? : Total 6 Click Score: 9    End of Session Equipment Utilized During Treatment: Gait belt Activity Tolerance: Patient tolerated treatment well Patient left: in chair;with call bell/phone within reach Nurse Communication: Mobility status;Need for lift equipment(stedy) PT Visit Diagnosis: Muscle weakness (generalized) (M62.81);Difficulty in walking, not elsewhere classified (R26.2)     Time: 1414-1440 PT Time Calculation (min) (ACUTE ONLY): 26 min  Charges:  $Therapeutic Activity: 23-37 mins                     Wray Kearns, [place], DPT Acute Rehabilitation Services Pager [phone] Office [phone]       Marguarite Arbour A Sabra Heck 02/07/2019, 2:46 PM

## 2019-02-08 DIAGNOSIS — I4891 Unspecified atrial fibrillation: Secondary | ICD-10-CM

## 2019-02-08 LAB — BASIC METABOLIC PANEL
Anion gap: 8 (ref 5–15)
BUN: 24 mg/dL — ABNORMAL HIGH (ref 8–23)
CALCIUM: 8.3 mg/dL — AB (ref 8.9–10.3)
CO2: 26 mmol/L (ref 22–32)
Chloride: 103 mmol/L (ref 98–111)
Creatinine, Ser: 0.99 mg/dL (ref 0.44–1.00)
GFR calc Af Amer: >60 mL/min (ref >60)
GFR, EST NON AFRICAN AMERICAN: 57 mL/min — AB (ref >60)
Glucose, Bld: 300 mg/dL — ABNORMAL HIGH (ref 70–99)
Potassium: 4.7 mmol/L (ref 3.5–5.1)
Sodium: 137 mmol/L (ref 135–145)

## 2019-02-08 LAB — CBC
HCT: 29.3 % — ABNORMAL LOW (ref 36.0–46.0)
Hemoglobin: 8.4 g/dL — ABNORMAL LOW (ref 12.0–15.0)
MCH: 26.8 pg (ref 26.0–34.0)
MCHC: 28.7 g/dL — ABNORMAL LOW (ref 30.0–36.0)
MCV: 93.6 fL (ref 80.0–100.0)
PLATELETS: 323 10*3/uL (ref 150–400)
RBC: 3.13 MIL/uL — ABNORMAL LOW (ref 3.87–5.11)
RDW: 18.6 % — AB (ref 11.5–15.5)
WBC: 8.6 10*3/uL (ref 4.0–10.5)
nRBC: 0.2 % (ref 0.0–0.2)

## 2019-02-08 LAB — GLUCOSE, CAPILLARY
Glucose-Capillary: 172 mg/dL — ABNORMAL HIGH (ref 70–99)
Glucose-Capillary: 304 mg/dL — ABNORMAL HIGH (ref 70–99)
Glucose-Capillary: 158 mg/dL — ABNORMAL HIGH (ref 70–99)
Glucose-Capillary: 282 mg/dL — ABNORMAL HIGH (ref 70–99)

## 2019-02-08 MED ORDER — INSULIN GLARGINE 100 UNIT/ML ~~LOC~~ SOLN
15.0000 [IU] | Freq: Every day | SUBCUTANEOUS | Status: DC
  Administered 2019-02-08: 15 [IU] via SUBCUTANEOUS
  Filled 2019-02-08 (×2): qty 0.15

## 2019-02-08 NOTE — Progress Notes (Signed)
PROGRESS NOTE    Cassandra Allen  ZCH:[id number] DOB: Aug 11, 1946 DOA: 01/31/2019 PCP: [name], MD    Brief Narrative: 73 year old with past medical history significant for A. fib on Eliquis, hypertension, diabetes, colon cancer, metastatic liver, status post exploratory laparotomy with partial colectomy and transverse  end colostomy on 12/27/2018 patient was discharged to skilled nursing facility she presented to the hospital on 3/20 complaining of generalized weakness, abdominal pain.  CT abdomen was performed which showed significant intra-abdominal abscess. Patient was a started on Zosyn and general surgery was consulted who recommended drainage by IR.   Assessment & Plan:   Active Problems:   Essential (primary) hypertension   Insulin dependent diabetes mellitus (HCC)   Atrial fibrillation, chronic   Anticoagulant long-term use   Cancer of sigmoid colon    Postoperative intra-abdominal abscess   1-Intra-abdominal abscess; status post recent colectomy and transverse end colostomy on 12/27/2018 Status post IR drain on 3/20. Blood cultures no growth. Final.  Drain culture grew Enterococcus faecalis that is sensitive to ampicillin.  Patient was changed to oral antibiotics per general surgery recommendation. She will need to follow-up with drain clinic. Afebrile, WBC normal.  Surgery recommending Augmentin for 7 days. Last dose 4-1.  A. fib with RVR: Eliquis was initially on hold due to anticipated procedure. Continue with  Eliquis. Overnight 3-24 she developed A. fib with RVR.  She received 5 mg IV Cardizem and her morning dose of Cardizem.  Appreciate cardiology assistance. Now on Cardizem 60 mg every 6 hours. Hr drop to 40 with Cardizem at 90.  Monitor on Cardizem 60 mg Q 6 hours, if hr remain stable plan to discharge to SNF tomorrow.   Chest pain;  Suspect related to A. fib RVR. Chest pain is not pleuritic. Patient also on eliquis.  Chest x-ray order: Show  atelectasis and right side pleural effusion. Incentive spirometry ordered. Troponin mildly elevated in the setting of A. fib RVR.  Cardiology has been consulted. Chest pain free this am.   Hypomagnesemia;  Received IV magnesium.  On  oral supplement.   Chronic diastolic heart failure: Started on IV lasix.  Appreciate cardiology help.  Monitor renal function. Stable at this time.   Insulin-dependent type 2 diabetes: Continue with Lantus and a sliding scale insulin. Increase lantus to 15 units, CBG increasing.   Metastatic  Adenocarcinoma: Colon cancer.  Colon cancer with no metastasis to liver. Follows with Dr. Annamaria Boots.  Back pain; check chest x ray.   Failure to drive: Needed SNF at discharge.  Severe caloric  malnutrition; in context of malignancy.  Albumin at 1.6   Normocytic anemia: Follow hemoglobin.   Estimated body mass index is 29.83 kg/m as calculated from the following:   Height as of this encounter: 5\' 2"  (1.575 m).   Weight as of this encounter: 74 kg.   DVT prophylaxis: Eliquis Code Status: Full code Family Communication: Care discussed with patient Disposition Plan: SNF when heart rate control  Consultants:   General surgery  Cardiology   Procedures:  US guided drainage of abdominal abscess. 78F biliary drain.   Antimicrobials:  Zosyn subsequently Augmentin  Subjective: She is feeling better this am. No chest pain, no nausea. Denies abdominal pain.    Objective: Vitals:   02/07/19 1043 02/07/19 1303 02/07/19 2022 02/08/19 0537  BP: 113/81 (!) 114/58 110/67 132/68  Pulse:   77 94  Resp: (!) 27  (!) 28 19  Temp:  98.8 F (37.1 C) 98.2 F (36.8 C)  97.8 F (36.6 C)  TempSrc:  Oral Oral Axillary  SpO2:   97% 94%  Weight:    74 kg  Height:        Intake/Output Summary (Last 24 hours) at 02/08/2019 0907 Last data filed at 02/08/2019 6160 Gross per 24 hour  Intake 490 ml  Output 1755 ml  Net -1265 ml   Filed Weights   02/06/19 0449  02/07/19 0623 02/08/19 0537  Weight: 74.8 kg 74.7 kg 74 kg    Examination:  General exam: NAD Respiratory system: Crackles bases.  Cardiovascular system: S 1, S 2 IRR Gastrointestinal system: BS present, soft, colostomy in place bag with stool, drain in left quadrant.  Central nervous system: alert.  Extremities: Trace edema Skin: No rashes.    Data Reviewed: I have personally reviewed following labs and imaging studies  CBC: Recent Labs  Lab 02/02/19 0318 02/03/19 0129 02/05/19 0444 02/06/19 0247 02/08/19 0301  WBC 6.1 7.3 9.2 10.2 8.6  NEUTROABS 4.4  --  6.9  --   --   HGB 8.0* 8.0* 8.0* 8.6* 8.4*  HCT 25.9* 27.5* 28.6* 28.4* 29.3*  MCV 90.2 91.4 93.5 92.2 93.6  PLT 346 362 355 375 737   Basic Metabolic Panel: Recent Labs  Lab 02/02/19 0318 02/03/19 0129 02/05/19 0444 02/06/19 0247 02/07/19 0629 02/08/19 0301  NA 138 137 137 137 139 137  K 3.3* 4.1 4.2 4.8 4.8 4.7  CL 106 104 106 100 100 103  CO2 26 26 28 27 30 26   GLUCOSE 248* 188* 144* 211* 161* 300*  BUN 14 17 15 16 21  24*  CREATININE 0.79 0.91 0.70 0.72 1.09* 0.99  CALCIUM 7.5* 7.6* 7.8* 8.2* 8.7* 8.3*  MG 1.3* 2.0 1.6* 1.8  --   --    GFR: Estimated Creatinine Clearance: 48.4 mL/min (by C-G formula based on SCr of 0.99 mg/dL). Liver Function Tests: No results for input(s): AST, ALT, ALKPHOS, BILITOT, PROT, ALBUMIN in the last 168 hours. No results for input(s): LIPASE, AMYLASE in the last 168 hours. No results for input(s): AMMONIA in the last 168 hours. Coagulation Profile: No results for input(s): INR, PROTIME in the last 168 hours. Cardiac Enzymes: Recent Labs  Lab 02/05/19 0724 02/05/19 1226 02/05/19 1757  TROPONINI 0.04* 0.05* 0.06*   BNP (last 3 results) No results for input(s): PROBNP in the last 8760 hours. HbA1C: No results for input(s): HGBA1C in the last 72 hours. CBG: Recent Labs  Lab 02/07/19 0752 02/07/19 1258 02/07/19 1732 02/07/19 2201 02/08/19 0724  GLUCAP 133*  393* 315* 252* 172*   Lipid Profile: No results for input(s): CHOL, HDL, LDLCALC, TRIG, CHOLHDL, LDLDIRECT in the last 72 hours. Thyroid Function Tests: No results for input(s): TSH, T4TOTAL, FREET4, T3FREE, THYROIDAB in the last 72 hours. Anemia Panel: No results for input(s): VITAMINB12, FOLATE, FERRITIN, TIBC, IRON, RETICCTPCT in the last 72 hours. Sepsis Labs: No results for input(s): PROCALCITON, LATICACIDVEN in the last 168 hours.  Recent Results (from the past 240 hour(s))  Blood culture (routine x 2)     Status: None   Collection Time: 01/31/19  1:09 PM  Result Value Ref Range Status   Specimen Description BLOOD RIGHT ARM  Final   Special Requests   Final    BOTTLES DRAWN AEROBIC AND ANAEROBIC Blood Culture adequate volume   Culture   Final    NO GROWTH 5 DAYS Performed at [facility], [address], [place], [place] [address]    Report Status  02/05/2019 FINAL  Final  Blood culture (routine x 2)     Status: None   Collection Time: 01/31/19  1:14 PM  Result Value Ref Range Status   Specimen Description BLOOD LEFT ANTECUBITAL  Final   Special Requests   Final    BOTTLES DRAWN AEROBIC AND ANAEROBIC Blood Culture adequate volume   Culture   Final    NO GROWTH 5 DAYS Performed at [facility], [address] [place], [place] [address]    Report Status 02/05/2019 FINAL  Final  Aerobic/Anaerobic Culture (surgical/deep wound)     Status: None   Collection Time: 01/31/19  5:41 PM  Result Value Ref Range Status   Specimen Description ABDOMEN  Final   Special Requests NONE  Final   Gram Stain   Final    ABUNDANT WBC PRESENT,BOTH PMN AND MONONUCLEAR MODERATE GRAM POSITIVE COCCI FEW GRAM VARIABLE ROD    Culture   Final    FEW ENTEROCOCCUS FAECALIS ABUNDANT PREVOTELLA BIVIA BETA LACTAMASE POSITIVE Performed at [facility], [place] [address], [place], [place] [address]    Report Status 02/04/2019 FINAL  Final   Organism ID, Bacteria ENTEROCOCCUS FAECALIS   Final      Susceptibility   Enterococcus faecalis - MIC*    AMPICILLIN <=2 SENSITIVE Sensitive     VANCOMYCIN 1 SENSITIVE Sensitive     GENTAMICIN SYNERGY SENSITIVE Sensitive     * FEW ENTEROCOCCUS FAECALIS         Radiology Studies: No results found.      Scheduled Meds: . amoxicillin-clavulanate  1 tablet Oral Q12H  . apixaban  5 mg Oral BID  . diltiazem  60 mg Oral Q6H  . feeding supplement (ENSURE ENLIVE)  237 mL Oral Q24H  . ferrous sulfate  325 mg Oral BID WC  . furosemide  20 mg Intravenous Daily  . insulin aspart  0-9 Units Subcutaneous TID WC  . insulin glargine  15 Units Subcutaneous QHS  . magnesium oxide  400 mg Oral BID  . Melatonin  6 mg Oral QHS  . oxybutynin  10 mg Oral Daily  . sertraline  100 mg Oral Daily  . simvastatin  20 mg Oral QHS  . sodium chloride flush  5 mL Intracatheter Q8H   Continuous Infusions: . sodium chloride 250 mL (02/07/19 0630)     LOS: 8 days    Time spent: 35 minutes     Cassandra Shiley, MD [facility] Pager [phone]  If 7PM-7AM, please contact night-coverage [email or web address] Password [facility] 02/08/2019, 9:07 AM

## 2019-02-09 DIAGNOSIS — K56609 Unspecified intestinal obstruction, unspecified as to partial versus complete obstruction: Secondary | ICD-10-CM | POA: Diagnosis not present

## 2019-02-09 DIAGNOSIS — E1122 Type 2 diabetes mellitus with diabetic chronic kidney disease: Secondary | ICD-10-CM | POA: Diagnosis present

## 2019-02-09 DIAGNOSIS — I248 Other forms of acute ischemic heart disease: Secondary | ICD-10-CM | POA: Diagnosis present

## 2019-02-09 DIAGNOSIS — A419 Sepsis, unspecified organism: Secondary | ICD-10-CM | POA: Diagnosis not present

## 2019-02-09 DIAGNOSIS — C787 Secondary malignant neoplasm of liver and intrahepatic bile duct: Secondary | ICD-10-CM | POA: Diagnosis not present

## 2019-02-09 DIAGNOSIS — K6811 Postprocedural retroperitoneal abscess: Secondary | ICD-10-CM | POA: Diagnosis not present

## 2019-02-09 DIAGNOSIS — R1084 Generalized abdominal pain: Secondary | ICD-10-CM | POA: Diagnosis not present

## 2019-02-09 DIAGNOSIS — F329 Major depressive disorder, single episode, unspecified: Secondary | ICD-10-CM | POA: Diagnosis not present

## 2019-02-09 DIAGNOSIS — R7989 Other specified abnormal findings of blood chemistry: Secondary | ICD-10-CM | POA: Diagnosis not present

## 2019-02-09 DIAGNOSIS — R443 Hallucinations, unspecified: Secondary | ICD-10-CM | POA: Diagnosis not present

## 2019-02-09 DIAGNOSIS — C189 Malignant neoplasm of colon, unspecified: Secondary | ICD-10-CM | POA: Diagnosis not present

## 2019-02-09 DIAGNOSIS — M6281 Muscle weakness (generalized): Secondary | ICD-10-CM | POA: Diagnosis not present

## 2019-02-09 DIAGNOSIS — R1030 Lower abdominal pain, unspecified: Secondary | ICD-10-CM | POA: Diagnosis not present

## 2019-02-09 DIAGNOSIS — N183 Chronic kidney disease, stage 3 (moderate): Secondary | ICD-10-CM | POA: Diagnosis not present

## 2019-02-09 DIAGNOSIS — A4151 Sepsis due to Escherichia coli [E. coli]: Secondary | ICD-10-CM | POA: Diagnosis not present

## 2019-02-09 DIAGNOSIS — R109 Unspecified abdominal pain: Secondary | ICD-10-CM | POA: Diagnosis not present

## 2019-02-09 DIAGNOSIS — E46 Unspecified protein-calorie malnutrition: Secondary | ICD-10-CM | POA: Diagnosis present

## 2019-02-09 DIAGNOSIS — R945 Abnormal results of liver function studies: Secondary | ICD-10-CM | POA: Diagnosis not present

## 2019-02-09 DIAGNOSIS — R6520 Severe sepsis without septic shock: Secondary | ICD-10-CM | POA: Diagnosis not present

## 2019-02-09 DIAGNOSIS — Z87898 Personal history of other specified conditions: Secondary | ICD-10-CM | POA: Diagnosis not present

## 2019-02-09 DIAGNOSIS — J9811 Atelectasis: Secondary | ICD-10-CM | POA: Diagnosis present

## 2019-02-09 DIAGNOSIS — R509 Fever, unspecified: Secondary | ICD-10-CM | POA: Diagnosis not present

## 2019-02-09 DIAGNOSIS — T8143XA Infection following a procedure, organ and space surgical site, initial encounter: Secondary | ICD-10-CM | POA: Diagnosis not present

## 2019-02-09 DIAGNOSIS — T8149XA Infection following a procedure, other surgical site, initial encounter: Secondary | ICD-10-CM | POA: Diagnosis not present

## 2019-02-09 DIAGNOSIS — T8143XD Infection following a procedure, organ and space surgical site, subsequent encounter: Secondary | ICD-10-CM | POA: Diagnosis not present

## 2019-02-09 DIAGNOSIS — E119 Type 2 diabetes mellitus without complications: Secondary | ICD-10-CM | POA: Diagnosis not present

## 2019-02-09 DIAGNOSIS — E7800 Pure hypercholesterolemia, unspecified: Secondary | ICD-10-CM | POA: Diagnosis not present

## 2019-02-09 DIAGNOSIS — R52 Pain, unspecified: Secondary | ICD-10-CM | POA: Diagnosis not present

## 2019-02-09 DIAGNOSIS — D630 Anemia in neoplastic disease: Secondary | ICD-10-CM | POA: Diagnosis present

## 2019-02-09 DIAGNOSIS — I482 Chronic atrial fibrillation, unspecified: Secondary | ICD-10-CM | POA: Diagnosis not present

## 2019-02-09 DIAGNOSIS — C187 Malignant neoplasm of sigmoid colon: Secondary | ICD-10-CM | POA: Diagnosis not present

## 2019-02-09 DIAGNOSIS — R6521 Severe sepsis with septic shock: Secondary | ICD-10-CM | POA: Diagnosis not present

## 2019-02-09 DIAGNOSIS — M2550 Pain in unspecified joint: Secondary | ICD-10-CM | POA: Diagnosis not present

## 2019-02-09 DIAGNOSIS — Z433 Encounter for attention to colostomy: Secondary | ICD-10-CM | POA: Diagnosis not present

## 2019-02-09 DIAGNOSIS — G4700 Insomnia, unspecified: Secondary | ICD-10-CM | POA: Diagnosis not present

## 2019-02-09 DIAGNOSIS — E876 Hypokalemia: Secondary | ICD-10-CM | POA: Diagnosis present

## 2019-02-09 DIAGNOSIS — E860 Dehydration: Secondary | ICD-10-CM | POA: Diagnosis not present

## 2019-02-09 DIAGNOSIS — I4891 Unspecified atrial fibrillation: Secondary | ICD-10-CM | POA: Diagnosis not present

## 2019-02-09 DIAGNOSIS — Z794 Long term (current) use of insulin: Secondary | ICD-10-CM | POA: Diagnosis not present

## 2019-02-09 DIAGNOSIS — I509 Heart failure, unspecified: Secondary | ICD-10-CM | POA: Diagnosis not present

## 2019-02-09 DIAGNOSIS — F331 Major depressive disorder, recurrent, moderate: Secondary | ICD-10-CM | POA: Diagnosis not present

## 2019-02-09 DIAGNOSIS — F339 Major depressive disorder, recurrent, unspecified: Secondary | ICD-10-CM | POA: Diagnosis not present

## 2019-02-09 DIAGNOSIS — I5032 Chronic diastolic (congestive) heart failure: Secondary | ICD-10-CM | POA: Diagnosis not present

## 2019-02-09 DIAGNOSIS — R41841 Cognitive communication deficit: Secondary | ICD-10-CM | POA: Diagnosis not present

## 2019-02-09 DIAGNOSIS — Z7401 Bed confinement status: Secondary | ICD-10-CM | POA: Diagnosis not present

## 2019-02-09 DIAGNOSIS — R262 Difficulty in walking, not elsewhere classified: Secondary | ICD-10-CM | POA: Diagnosis not present

## 2019-02-09 DIAGNOSIS — I4821 Permanent atrial fibrillation: Secondary | ICD-10-CM | POA: Diagnosis present

## 2019-02-09 DIAGNOSIS — R0602 Shortness of breath: Secondary | ICD-10-CM | POA: Diagnosis not present

## 2019-02-09 DIAGNOSIS — E1121 Type 2 diabetes mellitus with diabetic nephropathy: Secondary | ICD-10-CM | POA: Diagnosis not present

## 2019-02-09 DIAGNOSIS — J90 Pleural effusion, not elsewhere classified: Secondary | ICD-10-CM | POA: Diagnosis not present

## 2019-02-09 DIAGNOSIS — I10 Essential (primary) hypertension: Secondary | ICD-10-CM | POA: Diagnosis not present

## 2019-02-09 DIAGNOSIS — N182 Chronic kidney disease, stage 2 (mild): Secondary | ICD-10-CM | POA: Diagnosis present

## 2019-02-09 DIAGNOSIS — Z7189 Other specified counseling: Secondary | ICD-10-CM | POA: Diagnosis not present

## 2019-02-09 DIAGNOSIS — Z7901 Long term (current) use of anticoagulants: Secondary | ICD-10-CM | POA: Diagnosis not present

## 2019-02-09 DIAGNOSIS — K651 Peritoneal abscess: Secondary | ICD-10-CM | POA: Diagnosis not present

## 2019-02-09 DIAGNOSIS — I5033 Acute on chronic diastolic (congestive) heart failure: Secondary | ICD-10-CM | POA: Diagnosis not present

## 2019-02-09 DIAGNOSIS — T85638A Leakage of other specified internal prosthetic devices, implants and grafts, initial encounter: Secondary | ICD-10-CM | POA: Diagnosis not present

## 2019-02-09 DIAGNOSIS — L0291 Cutaneous abscess, unspecified: Secondary | ICD-10-CM | POA: Diagnosis not present

## 2019-02-09 DIAGNOSIS — A4181 Sepsis due to Enterococcus: Secondary | ICD-10-CM | POA: Diagnosis not present

## 2019-02-09 DIAGNOSIS — Z1159 Encounter for screening for other viral diseases: Secondary | ICD-10-CM | POA: Diagnosis not present

## 2019-02-09 DIAGNOSIS — R609 Edema, unspecified: Secondary | ICD-10-CM | POA: Diagnosis not present

## 2019-02-09 DIAGNOSIS — I959 Hypotension, unspecified: Secondary | ICD-10-CM | POA: Diagnosis not present

## 2019-02-09 DIAGNOSIS — D500 Iron deficiency anemia secondary to blood loss (chronic): Secondary | ICD-10-CM | POA: Diagnosis not present

## 2019-02-09 DIAGNOSIS — A4189 Other specified sepsis: Secondary | ICD-10-CM | POA: Diagnosis not present

## 2019-02-09 DIAGNOSIS — R188 Other ascites: Secondary | ICD-10-CM | POA: Diagnosis not present

## 2019-02-09 DIAGNOSIS — E785 Hyperlipidemia, unspecified: Secondary | ICD-10-CM | POA: Diagnosis present

## 2019-02-09 DIAGNOSIS — C185 Malignant neoplasm of splenic flexure: Secondary | ICD-10-CM | POA: Diagnosis not present

## 2019-02-09 DIAGNOSIS — I130 Hypertensive heart and chronic kidney disease with heart failure and stage 1 through stage 4 chronic kidney disease, or unspecified chronic kidney disease: Secondary | ICD-10-CM | POA: Diagnosis not present

## 2019-02-09 LAB — BASIC METABOLIC PANEL
Anion gap: 8 (ref 5–15)
BUN: 24 mg/dL — ABNORMAL HIGH (ref 8–23)
CO2: 31 mmol/L (ref 22–32)
Calcium: 8.7 mg/dL — ABNORMAL LOW (ref 8.9–10.3)
Chloride: 99 mmol/L (ref 98–111)
Creatinine, Ser: 0.75 mg/dL (ref 0.44–1.00)
GFR calc non Af Amer: >60 mL/min (ref >60)
Glucose, Bld: 132 mg/dL — ABNORMAL HIGH (ref 70–99)
Potassium: 5.1 mmol/L (ref 3.5–5.1)
Sodium: 138 mmol/L (ref 135–145)

## 2019-02-09 LAB — GLUCOSE, CAPILLARY
GLUCOSE-CAPILLARY: 210 mg/dL — AB (ref 70–99)
Glucose-Capillary: 125 mg/dL — ABNORMAL HIGH (ref 70–99)

## 2019-02-09 MED ORDER — INSULIN ASPART 100 UNIT/ML ~~LOC~~ SOLN: 0.0000 [IU] | Freq: Three times a day (TID) | SUBCUTANEOUS | 11 refills | Status: DC

## 2019-02-09 MED ORDER — DILTIAZEM HCL ER COATED BEADS 240 MG PO CP24
240.0000 mg | ORAL_CAPSULE | Freq: Every day | ORAL | Status: DC
  Administered 2019-02-09: 240 mg via ORAL
  Filled 2019-02-09: qty 1

## 2019-02-09 MED ORDER — DILTIAZEM HCL ER COATED BEADS 240 MG PO CP24: 240.0000 mg | ORAL_CAPSULE | Freq: Every day | ORAL | 0 refills | Status: DC

## 2019-02-09 MED ORDER — TRAMADOL HCL 50 MG PO TABS: 50.0000 mg | ORAL_TABLET | Freq: Every day | ORAL | 0 refills | Status: DC | PRN

## 2019-02-09 MED ORDER — DILTIAZEM HCL ER COATED BEADS 240 MG PO CP24: 240.0000 mg | ORAL_CAPSULE | Freq: Every day | ORAL | Status: DC

## 2019-02-09 MED ORDER — AMOXICILLIN-POT CLAVULANATE 875-125 MG PO TABS: 1.0000 | ORAL_TABLET | Freq: Two times a day (BID) | ORAL | 0 refills | Status: AC

## 2019-02-09 MED ORDER — INSULIN GLARGINE 100 UNIT/ML ~~LOC~~ SOLN: 15.0000 [IU] | Freq: Every day | SUBCUTANEOUS | 11 refills | Status: DC

## 2019-02-09 MED ORDER — FUROSEMIDE 20 MG PO TABS
20.0000 mg | ORAL_TABLET | Freq: Every day | ORAL | Status: DC
  Administered 2019-02-09: 20 mg via ORAL
  Filled 2019-02-09: qty 1

## 2019-02-09 NOTE — Progress Notes (Signed)
Gave report to [facility] on unit. VSS. Gave report to [facility].   Saddie Benders RN

## 2019-02-09 NOTE — Discharge Summary (Signed)
Physician Discharge Summary  Marny Smethers [facility] EPP:[id number] DOB: 03-19-1946 DOA: 01/31/2019  PCP: [name], MD  Admit date: 01/31/2019 Discharge date: 02/09/2019  Admitted From: SNF Disposition:  SNF  Recommendations for Outpatient Follow-up:  1. Follow up with PCP in 1-2 weeks 2. Please obtain BMP/CBC in one week 3. Needs to follow up with drain clinic IR.  4. Need drain flush with 10 cc sterile sialine daily.  5. She will need CT abdomen and pelvis when drain out put less than 10-15 cc daily.  6. Needs to follow up with sx and cardiology.   Discharge Condition: stable.  CODE STATUS: full code Diet recommendation: Carb Modified   Brief/Interim Summary: 73 year old with past medical history significant for A. fib on Eliquis, hypertension, diabetes, colon cancer, metastatic liver, status post exploratory laparotomy with partial colectomy and transverse  end colostomy on 12/27/2018 patient was discharged to skilled nursing facility she presented to the hospital on 3/20 complaining of generalized weakness, abdominal pain.  CT abdomen was performed which showed significant intra-abdominal abscess. Patient was a started on Zosyn and general surgery was consulted who recommended drainage by IR.   1-Intra-abdominal abscess; status post recent colectomy and transverse end colostomy on 12/27/2018 Status post IR drain on 3/20. Blood cultures no growth. Final.  Drain culture grew Enterococcus faecalis that is sensitive to ampicillin.  Patient was changed to oral antibiotics per general surgery recommendation. She will need to follow-up with drain clinic. Afebrile, WBC normal.  Surgery recommending Augmentin for 7 days. Last dose 4-1. Needs daily drain flush with 10 cc sterile saline. Needs to follow up with drain IR clinic. She will need repeat CT abdomen pelvis when drain out put is less than 10-`5 cc daily.   A. fib with RVR: Eliquis was initially on hold due to anticipated  procedure. Continue with  Eliquis. Overnight 3-24 she developed A. fib with RVR.  She received 5 mg IV Cardizem and her morning dose of Cardizem.  Appreciate cardiology assistance. Now on Cardizem 60 mg every 6 hours. Hr drop to 40 with Cardizem at 90.  HR improved on Cardizem 60 mg every 6 hours. Plan to discharge on 240 mg daily.  Needs to follow up with cardiology for further management.   Chest pain;  Suspect related to A. fib RVR. Chest pain is not pleuritic. Patient also on eliquis.  Chest x-ray order: Show atelectasis and right side pleural effusion. Incentive spirometry ordered. Troponin mildly elevated in the setting of A. fib RVR.  Cardiology has been consulted. Chest pain free this am.   Hypomagnesemia;  Received IV magnesium.  On  oral supplement.   Chronic diastolic heart failure: Received IV lasix. Negative 8 L. Change lasix to oral PRN Appreciate cardiology help.  Monitor renal function. Stable at this time.   Insulin-dependent type 2 diabetes: Continue with Lantus and a sliding scale insulin. Continue with lantus 15 units daily. SSI.   Metastatic  Adenocarcinoma: Colon cancer.  Colon cancer with no metastasis to liver. Follows with Dr. Annamaria Boots.  Back pain; no complaints today.   Failure to drive: Needed SNF at discharge.  Severe caloric  malnutrition; in context of malignancy.  Albumin at 1.6   Normocytic anemia: Follow hemoglobin. On ferrous sulfate.   Discharge Diagnoses:  Active Problems:   Essential (primary) hypertension   Insulin dependent diabetes mellitus (HCC)   Atrial fibrillation, chronic   Anticoagulant long-term use   Cancer of sigmoid colon    Postoperative intra-abdominal abscess   Atrial  fibrillation with RVR [facility])    Discharge Instructions  Discharge Instructions    Diet - low sodium heart healthy   Complete by:  As directed    Increase activity slowly   Complete by:  As directed      Allergies as of 02/09/2019       Reactions   Metformin And Related Nausea And Vomiting   Prednisone Nausea And Vomiting      Medication List    STOP taking these medications   bisacodyl 10 MG suppository Commonly known as:  DULCOLAX   NON FORMULARY   NON FORMULARY   NovoLIN N FlexPen 100 UNIT/ML Kiwkpen Generic drug:  Insulin NPH (Human) (Isophane)     TAKE these medications   acetaminophen 325 MG tablet Commonly known as:  TYLENOL Take 650 mg by mouth every 4 (four) hours as needed for mild pain. Do not exceed more than 3gm total in 24 hrs   amoxicillin-clavulanate 875-125 MG tablet Commonly known as:  AUGMENTIN Take 1 tablet by mouth every 12 (twelve) hours for 3 days.   apixaban 5 MG Tabs tablet Commonly known as:  Eliquis TAKE 1 TABLET (5 MG TOTAL) BY MOUTH 2 (TWO) TIMES DAILY. What changed:    how much to take  how to take this  when to take this  additional instructions   diltiazem 240 MG 24 hr capsule Commonly known as:  CARDIZEM CD Take 1 capsule (240 mg total) by mouth daily. What changed:    medication strength  how much to take   feeding supplement (ENSURE ENLIVE) Liqd Ensure Enlive one by mouth daily for supplement   furosemide 20 MG tablet Commonly known as:  Lasix Lasix 20 mg po daily PRN for weight of greater than 3 lbs in a day or 5 lbs in a week What changed:    how much to take  how to take this  when to take this  reasons to take this  additional instructions   insulin aspart 100 UNIT/ML injection Commonly known as:  novoLOG Inject 0-9 Units into the skin 3 (three) times daily with meals. Correction coverage: Sensitive (thin, NPO, renal) CBG < 70: implement hypoglycemia protocol CBG 70 - 120: 0 units CBG 121 - 150: 1 unit CBG 151 - 200: 2 units CBG 201 - 250: 3 units CBG 251 - 300: 5 units CBG 301 - 350: 7 units CBG 351 - 400 9 units CBG > 400 call MD and obtain STAT lab verification   insulin glargine 100 UNIT/ML injection Commonly known as:   LANTUS Inject 0.15 mLs (15 Units total) into the skin at bedtime. What changed:  how much to take   KP Ferrous Sulfate 325 (65 FE) MG tablet Generic drug:  ferrous sulfate Take 325 mg by mouth daily with breakfast.   Melatonin 3 MG Tabs Take 6 mg by mouth at bedtime.   ondansetron 8 MG tablet Commonly known as:  ZOFRAN Take 8 mg by mouth every 8 (eight) hours as needed for nausea.   oxybutynin 10 MG 24 hr tablet Commonly known as:  DITROPAN-XL Take 10 mg by mouth daily.   sertraline 100 MG tablet Commonly known as:  ZOLOFT Take 100 mg by mouth daily.   simvastatin 20 MG tablet Commonly known as:  ZOCOR Take 20 mg by mouth at bedtime.   traMADol 50 MG tablet Commonly known as:  ULTRAM Take 1 tablet (50 mg total) by mouth daily as needed. Take one tab a day  prn pain--do NOT take within 12 hours of Zoloft administration!! What changed:    reasons to take this  additional instructions       Contact information for follow-up providers    [name], MD Follow up.   Specialty:  Family Medicine Contact information: [place] [address] [phone]        Thompson Grayer, MD .   Specialty:  Cardiology Contact information: [place] [address] [phone]        Corrie Mckusick, DO Follow up in 2 week(s).   Specialties:  Interventional Radiology, Radiology Why:   follow-up in drain clinic in 7-10 days for CT abdomen/pelvis and drain injection  Contact information: [address] [place] [address] [phone]        [name], MD. Go on 02/26/2019.   Specialty:  General Surgery Why:  Your appointment is 04/15 at 3:30 pm. Please arrive 30 minutes prior to your appointment to check in and fill out paperwork. Bring photo ID and insurance information. Contact information: [address] [place] [address] [phone]            Contact information for  after-discharge care    Destination    [facility] .   Service:  Skilled Nursing Contact information: [address] [place] [address] [phone]                 Allergies  Allergen Reactions  . Metformin And Related Nausea And Vomiting  . Prednisone Nausea And Vomiting    Consultations: Cardiology Surgery  IR  Procedures/Studies: Ct Head Wo Contrast  Result Date: 01/31/2019 CLINICAL DATA:  Muscle weakness, left lower extremity weakness EXAM: CT HEAD WITHOUT CONTRAST TECHNIQUE: Contiguous axial images were obtained from the base of the skull through the vertex without intravenous contrast. COMPARISON:  None. FINDINGS: Brain: No acute territorial infarction, hemorrhage or intracranial mass. Mild atrophy. Mild small vessel ischemic changes of the white matter. Vascular: No hyperdense vessels.  Carotid vascular calcification. Skull: Normal. Negative for fracture or focal lesion. Sinuses/Orbits: No acute finding. Other: None IMPRESSION: 1. No CT evidence for acute intracranial abnormality. 2. Atrophy and mild small vessel ischemic changes of the white matter Electronically Signed   By: Donavan Foil M.D.   On: 01/31/2019 15:54   Ct Abdomen Pelvis W Contrast  Result Date: 01/31/2019 CLINICAL DATA:  Post laparoscopic procedure with sigmoid colectomy for colon cancer. Diffuse hepatic masses. Two chemotherapy treatments. EXAM: CT ABDOMEN AND PELVIS WITH CONTRAST TECHNIQUE: Multidetector CT imaging of the abdomen and pelvis was performed using the standard protocol following bolus administration of intravenous contrast. CONTRAST:  155mL ISOVUE-300 IOPAMIDOL (ISOVUE-300) INJECTION 61% COMPARISON:  01/05/2019, 12/26/2018 and 11/01/2018 FINDINGS: Lower chest: Lung bases demonstrate worsening moderate size left effusion with associated basilar atelectasis. Right lung base is clear. Hepatobiliary: Several hypodense masses with the largest over the  caudate lobe measuring 2.6 cm without significant change likely metastatic disease. Gallbladder and biliary tree are within normal. Pancreas: Normal. Spleen: Few small stable subcentimeter hypodensities. Adrenals/Urinary Tract: Right adrenal gland is normal. 2.7 x 3.2 cm left adrenal mass without significant change likely metastatic disease. Kidneys are normal size without hydronephrosis or nephrolithiasis. Subcentimeter hypodensity over the upper pole right renal cortex too small to characterize but likely a cyst. Ureters and bladder are normal. Stomach/Bowel: Stomach is within normal. Small bowel is unremarkable. Appendix not visualized.  Evidence of patient's previous partial colectomy with ostomy site over the right anterior abdominal wall. Evidence of Hartmann's pouch. Vascular/Lymphatic: Mild calcified plaque over the abdominal aorta. No adenopathy. Reproductive: Unremarkable. Other: There is a large fluid collection containing multiple foci of air with thin rim enhancement over the central lower abdomen extending into the left mid to upper abdomen measuring approximately 12.6 x 24.6 x 18.4 cm and AP, transverse and craniocaudal dimensions. This likely represents a intra-abdominal abscess. There is an oval area over the omentum in the anterior midline upper abdomen measuring 8.4 x 16.6 cm containing fluid and multiple collections of air likely additional site of infection. There is mild ascites. Small right inguinal hernia containing only peritoneal fat. Musculoskeletal: Degenerative change of the spine and hips. IMPRESSION: Postsurgical change compatible recent partial colectomy for colon cancer with Hartmann's pouch and right abdominal wall colostomy. There is a large rim enhancing fluid collection containing multiple foci of air over the lower abdomen extending into the left mid to upper abdomen measuring 12.6 x 24.6 x 18.4 cm likely an intra-abdominal abscess. There is an oval collection over the anterior  midline upper abdomen over the omentum containing fat, fluid and multiple foci of air measuring 8.4 x 16.6 cm likely a second site of infection. Mild ascites. Several liver hypodensities with the largest over the caudate lobe measuring 2.6 cm without significant change likely metastatic disease. 3.2 cm left adrenal mass without significant change likely metastatic disease. Several small splenic hypodensities unchanged and likely benign, although metastatic disease is possible. Worsening moderate size left pleural effusion with associated basilar atelectasis. Subcentimeter right renal cortical hypodensity too small to characterize but likely a cyst. Small right inguinal hernia containing only peritoneal fat. Aortic Atherosclerosis (ICD10-I70.0). These results were called by telephone at the time of interpretation on 01/31/2019 at 11:18 am to Dr. Gurney Maxin, who verbally acknowledged these results. Electronically Signed   By: Marin Olp M.D.   On: 01/31/2019 11:16   Dg Chest Port 1 View  Result Date: 02/05/2019 CLINICAL DATA:  Onset chest pain this morning. Atrial fibrillation with rapid ventricular response. EXAM: PORTABLE CHEST 1 VIEW COMPARISON:  Single-view of the chest 12/31/2018. PA and lateral chest 07/10/2018. FINDINGS: There is a small to moderate left pleural effusion and basilar airspace disease. No right effusion. The right lung is clear. Cardiomegaly is identified. Aortic atherosclerosis is seen. Right IJ approach Port-A-Cath is unchanged since the most recent exam. IMPRESSION: Small to moderate left pleural effusion and basilar airspace disease which could be atelectasis or pneumonia. Cardiomegaly without edema. Electronically Signed   By: Inge Rise M.D.   On: 02/05/2019 09:44   [place] Image Guided Fluid Drain By Catheter  Result Date: 02/06/2019 INDICATION: 73 year old female with a history intra-abdominal fluid collection EXAM: ULTRASOUND-GUIDED DRAINAGE ABDOMINAL FLUID COLLECTION  MEDICATIONS: The patient is currently admitted to the hospital and receiving intravenous antibiotics. The antibiotics were administered within an appropriate time frame prior to the initiation of the procedure. ANESTHESIA/SEDATION: Fentanyl 50 mcg IV; Versed 2.0 mg IV Moderate Sedation Time:  20 minutes The patient was continuously monitored during the procedure by the interventional radiology nurse under my direct supervision. COMPLICATIONS: None PROCEDURE: Informed written consent was obtained from the patient after a thorough discussion of the procedural risks, benefits and alternatives. All questions were addressed. Maximal Sterile Barrier Technique was utilized including caps, mask, sterile gowns, sterile gloves, sterile drape, hand hygiene and skin antiseptic. A timeout was performed prior to the initiation of the procedure.  Patient positioned supine position on the ultrasound table. Ultrasound images of the abdomen performed with images stored sent to PACs. Patient is prepped and draped in the usual sterile fashion. 1% lidocaine was used for local anesthesia. Ultrasound guidance was used to place a 12 [place] biliary drain 45 cm using modified Seldinger technique. Aspirate was for culture. Catheter was sutured in position and attached to bulb drainage. Patient tolerated the procedure well and remained hemodynamically stable throughout. No complications were encountered and no significant blood loss. IMPRESSION: Status post ultrasound-guided abdominal fluid drainage. Signed, Dulcy Fanny. Dellia Nims, RPVI [facility] Radiology Electronically Signed   By: Corrie Mckusick D.O.   On: 02/06/2019 07:46      Subjective: Feeling well, denies chest pain or dyspnea/.   Discharge Exam: Vitals:   02/08/19 1956 02/09/19 0358  BP: (!) 118/54 (!) 109/57  Pulse: 61 (!) 55  Resp: 19 (!) 22  Temp: 98.5 F (36.9 C) 98.4 F (36.9 C)  SpO2: 96% 96%     General: Pt is  alert, awake, not in acute distress Cardiovascular: RRR, S1/S2 +, no rubs, no gallops Respiratory: CTA bilaterally, no wheezing, no rhonchi Abdominal: Soft, NT, ND, bowel sounds +colostomy in place, drain in place left LQ  Extremities: no edema, no cyanosis    The results of significant diagnostics from this hospitalization (including imaging, microbiology, ancillary and laboratory) are listed below for reference.     Microbiology: Recent Results (from the past 240 hour(s))  Blood culture (routine x 2)     Status: None   Collection Time: 01/31/19  1:09 PM  Result Value Ref Range Status   Specimen Description BLOOD RIGHT ARM  Final   Special Requests   Final    BOTTLES DRAWN AEROBIC AND ANAEROBIC Blood Culture adequate volume   Culture   Final    NO GROWTH 5 DAYS Performed at [facility], [address], [place], [place] [address]    Report Status 02/05/2019 FINAL  Final  Blood culture (routine x 2)     Status: None   Collection Time: 01/31/19  1:14 PM  Result Value Ref Range Status   Specimen Description BLOOD LEFT ANTECUBITAL  Final   Special Requests   Final    BOTTLES DRAWN AEROBIC AND ANAEROBIC Blood Culture adequate volume   Culture   Final    NO GROWTH 5 DAYS Performed at [facility], [place] [address] [place], [place] [address]    Report Status 02/05/2019 FINAL  Final  Aerobic/Anaerobic Culture (surgical/deep wound)     Status: None   Collection Time: 01/31/19  5:41 PM  Result Value Ref Range Status   Specimen Description ABDOMEN  Final   Special Requests NONE  Final   Gram Stain   Final    ABUNDANT WBC PRESENT,BOTH PMN AND MONONUCLEAR MODERATE GRAM POSITIVE COCCI FEW GRAM VARIABLE ROD    Culture   Final    FEW ENTEROCOCCUS FAECALIS ABUNDANT PREVOTELLA BIVIA BETA LACTAMASE POSITIVE Performed at [facility], [place] [address], [place], [place] [address]    Report Status 02/04/2019 FINAL  Final   Organism ID, Bacteria ENTEROCOCCUS FAECALIS   Final      Susceptibility   Enterococcus faecalis - MIC*    AMPICILLIN <=2 SENSITIVE Sensitive     VANCOMYCIN 1 SENSITIVE Sensitive     GENTAMICIN SYNERGY SENSITIVE Sensitive     * FEW ENTEROCOCCUS FAECALIS     Labs: BNP (last  3 results) Recent Labs    12/22/18 1322 12/27/18 0429  BNP 388.1* 242.6*   Basic Metabolic Panel: Recent Labs  Lab 02/03/19 0129 02/05/19 0444 02/06/19 0247 02/07/19 0629 02/08/19 0301 02/09/19 0741  NA 137 137 137 139 137 138  K 4.1 4.2 4.8 4.8 4.7 5.1  CL 104 106 100 100 103 99  CO2 26 28 27 30 26 31   GLUCOSE 188* 144* 211* 161* 300* 132*  BUN 17 15 16 21  24* 24*  CREATININE 0.91 0.70 0.72 1.09* 0.99 0.75  CALCIUM 7.6* 7.8* 8.2* 8.7* 8.3* 8.7*  MG 2.0 1.6* 1.8  --   --   --    Liver Function Tests: No results for input(s): AST, ALT, ALKPHOS, BILITOT, PROT, ALBUMIN in the last 168 hours. No results for input(s): LIPASE, AMYLASE in the last 168 hours. No results for input(s): AMMONIA in the last 168 hours. CBC: Recent Labs  Lab 02/03/19 0129 02/05/19 0444 02/06/19 0247 02/08/19 0301  WBC 7.3 9.2 10.2 8.6  NEUTROABS  --  6.9  --   --   HGB 8.0* 8.0* 8.6* 8.4*  HCT 27.5* 28.6* 28.4* 29.3*  MCV 91.4 93.5 92.2 93.6  PLT 362 355 375 323   Cardiac Enzymes: Recent Labs  Lab 02/05/19 0724 02/05/19 1226 02/05/19 1757  TROPONINI 0.04* 0.05* 0.06*   BNP: Invalid input(s): POCBNP CBG: Recent Labs  Lab 02/08/19 0724 02/08/19 1109 02/08/19 1632 02/08/19 2120 02/09/19 0725  GLUCAP 172* 304* 158* 282* 125*   D-Dimer No results for input(s): DDIMER in the last 72 hours. Hgb A1c No results for input(s): HGBA1C in the last 72 hours. Lipid Profile No results for input(s): CHOL, HDL, LDLCALC, TRIG, CHOLHDL, LDLDIRECT in the last 72 hours. Thyroid function studies No results for input(s): TSH, T4TOTAL, T3FREE, THYROIDAB in the last 72 hours.  Invalid input(s): FREET3 Anemia work up No results for input(s): VITAMINB12, FOLATE,  FERRITIN, TIBC, IRON, RETICCTPCT in the last 72 hours. Urinalysis    Component Value Date/Time   COLORURINE YELLOW 01/31/2019 1450   APPEARANCEUR CLEAR 01/31/2019 1450   LABSPEC >1.046 (H) 01/31/2019 1450   PHURINE 5.0 01/31/2019 1450   GLUCOSEU NEGATIVE 01/31/2019 1450   HGBUR SMALL (A) 01/31/2019 1450   BILIRUBINUR NEGATIVE 01/31/2019 1450   KETONESUR NEGATIVE 01/31/2019 1450   PROTEINUR NEGATIVE 01/31/2019 1450   UROBILINOGEN 0.2 03/25/2015 2317   NITRITE POSITIVE (A) 01/31/2019 1450   LEUKOCYTESUR NEGATIVE 01/31/2019 1450   Sepsis Labs Invalid input(s): PROCALCITONIN,  WBC,  LACTICIDVEN Microbiology Recent Results (from the past 240 hour(s))  Blood culture (routine x 2)     Status: None   Collection Time: 01/31/19  1:09 PM  Result Value Ref Range Status   Specimen Description BLOOD RIGHT ARM  Final   Special Requests   Final    BOTTLES DRAWN AEROBIC AND ANAEROBIC Blood Culture adequate volume   Culture   Final    NO GROWTH 5 DAYS Performed at [facility], [address] [place], [place] [address]    Report Status 02/05/2019 FINAL  Final  Blood culture (routine x 2)     Status: None   Collection Time: 01/31/19  1:14 PM  Result Value Ref Range Status   Specimen Description BLOOD LEFT ANTECUBITAL  Final   Special Requests   Final    BOTTLES DRAWN AEROBIC AND ANAEROBIC Blood Culture adequate volume   Culture   Final    NO GROWTH 5 DAYS Performed at [facility], [address] [place], [place] [address]    Report Status 02/05/2019 FINAL  Final  Aerobic/Anaerobic Culture (surgical/deep wound)     Status: None   Collection Time: 01/31/19  5:41 PM  Result Value Ref Range Status   Specimen Description ABDOMEN  Final   Special Requests NONE  Final   Gram Stain   Final    ABUNDANT WBC PRESENT,BOTH PMN AND MONONUCLEAR MODERATE GRAM POSITIVE COCCI FEW GRAM VARIABLE ROD    Culture   Final    FEW ENTEROCOCCUS FAECALIS ABUNDANT PREVOTELLA BIVIA BETA  LACTAMASE POSITIVE Performed at [facility], [place] [address], [place], [place] [address]    Report Status 02/04/2019 FINAL  Final   Organism ID, Bacteria ENTEROCOCCUS FAECALIS  Final      Susceptibility   Enterococcus faecalis - MIC*    AMPICILLIN <=2 SENSITIVE Sensitive     VANCOMYCIN 1 SENSITIVE Sensitive     GENTAMICIN SYNERGY SENSITIVE Sensitive     * FEW ENTEROCOCCUS FAECALIS     Time coordinating discharge: 40 minutes  SIGNED:   Elmarie Shiley, MD  [facility]

## 2019-02-09 NOTE — TOC Transition Note (Addendum)
Transition of Care [facility]) - CM/SW Discharge Note   Patient Details  Name: Cassandra Allen MRN: [id number] Date of Birth: 04/03/1946  [facility] CM/SW Contact:  Alberteen Sam, LCSW Phone Number: 02/09/2019, 1:08 PM   Clinical Narrative:     Patient will DC to: [facility] Anticipated DC date: 02/09/2019 Family notified: [name] - lvm Transport by: Corey Harold  Per MD patient ready for DC to [facility] . RN, patient, patient's family, and facility notified of DC. Discharge Summary sent to facility. RN given number for report [phone] ask for [facility] . DC packet on chart. Ambulance transport requested for patient.  CSW signing off.  [place]   Final next level of care: Skilled Nursing Facility Barriers to Discharge: No Barriers Identified   Patient Goals and CMS Choice Patient states their goals for this hospitalization and ongoing recovery are:: to go to rehab then home CMS Medicare.gov Compare Post Acute Care list provided to:: Patient Represenative (must comment) Choice offered to / list presented to : Patient  Discharge Placement PASRR number recieved: 02/03/19            Patient chooses bed at: [facility] Patient to be transferred to facility by: [facility] Name of family member notified: [name] (son) Patient and family notified of of transfer: 02/09/19  Discharge Plan and Services In-house Referral: Clinical Social Work   Post Acute Care Choice: [place]                    Social Determinants of Health (SDOH) Interventions     Readmission Risk Interventions No flowsheet data found.

## 2019-02-10 ENCOUNTER — Encounter: Payer: Self-pay | Admitting: Internal Medicine

## 2019-02-10 ENCOUNTER — Non-Acute Institutional Stay (SKILLED_NURSING_FACILITY): Payer: Medicare HMO | Admitting: Internal Medicine

## 2019-02-10 DIAGNOSIS — D500 Iron deficiency anemia secondary to blood loss (chronic): Secondary | ICD-10-CM | POA: Diagnosis not present

## 2019-02-10 DIAGNOSIS — F329 Major depressive disorder, single episode, unspecified: Secondary | ICD-10-CM

## 2019-02-10 DIAGNOSIS — K56609 Unspecified intestinal obstruction, unspecified as to partial versus complete obstruction: Secondary | ICD-10-CM

## 2019-02-10 DIAGNOSIS — F32A Depression, unspecified: Secondary | ICD-10-CM

## 2019-02-10 DIAGNOSIS — C185 Malignant neoplasm of splenic flexure: Secondary | ICD-10-CM | POA: Diagnosis not present

## 2019-02-10 DIAGNOSIS — Z794 Long term (current) use of insulin: Secondary | ICD-10-CM | POA: Diagnosis not present

## 2019-02-10 DIAGNOSIS — T8143XA Infection following a procedure, organ and space surgical site, initial encounter: Secondary | ICD-10-CM

## 2019-02-10 DIAGNOSIS — IMO0001 Reserved for inherently not codable concepts without codable children: Secondary | ICD-10-CM

## 2019-02-10 DIAGNOSIS — E119 Type 2 diabetes mellitus without complications: Secondary | ICD-10-CM | POA: Diagnosis not present

## 2019-02-10 DIAGNOSIS — T8149XA Infection following a procedure, other surgical site, initial encounter: Secondary | ICD-10-CM

## 2019-02-10 NOTE — Progress Notes (Signed)
This is an acute visit.  Level of care skilled.  Facility is [facility].  Chief complaint acute visit status post hospitalization for intra-abdominal abscess with history of colon cancer with metastasis  History of present illness.  Patient is a 73 year old female seen today for return to facility after hospitalization for intra-abdominal abscess-status post recent colectomy and transverse and colostomy on December 27, 2018-  She did have a drain placed blood cultures were negative drain culture did grow out enterococcus faecalis that was sensitive to ampicillin-she was changed to oral antibiotics initially was treated with Zosyn.  She will complete a course of Augmentin this week.  She will need surgery follow-up as well as follow-up for the drain and a follow-up CT scan when drainage is reduced.  Patient also has a history of atrial fibrillation Eliquis was held due to anticipated procedures-has been restarted- she did develop rapid ventricular rate apparently in the hospital and received IV Cardizem.  She was put on Cardizem 60 mg every 6 hours this was switched to 240 mg extended release at discharge-this will warrant follow-up heart rate is in the 90s currently.  She also complained of chest pain in the hospital there was thought secondary to atrial fibrillation with rapid ventricular rate--chest x-ray showed atelectasis in the right side pleural effusion and incentive spirometry was ordered- troponin was mildly elevated this was thought secondary to her A. fib with rapid ventricular rate.  She will need cardiology follow-up.  She also had slight hypomagnesemia and received IV magnesium and this did normalized at 1.8 on lab done March March 26.  She also has a history of diastolic CHF is on as needed Lasix she did receive IV Lasix in the hospital- at this point monitor her edema appears to be minimal currently.  She is also type II diabetic she is on Lantus with sliding scale  insulin.  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 Annamaria Boots  Currently she is lying in bed she appears to be fairly comfortable but very weak- continues to have somewhat of a flat affect she continues on Zoloft and before her admission the hospital we actually did have a psychiatric consult pending for follow-up of depression.  Vital signs appear to be stable.   Past Medical History:  Diagnosis Date  . Acute diastolic heart failure ([place]) 12/22/2018  . Arthritis   . Atrial fibrillation, chronic 12/22/2018  . Cancer of left colon ([place]) 10/30/2018  . Cancer of sigmoid colon  12/27/2018  . Diabetes mellitus without complication ([place])   . Hypertension   . Obesity (BMI 30-39.9) 12/27/2018           Past Surgical History:  Procedure Laterality Date  . COLONOSCOPY  10/2018   Dr [name].  Large cancer at splenic flexure,  Bulky sigmoid colon mass, Numerous polyps  . IR IMAGING GUIDED PORT INSERTION  11/20/2018  . LAPAROTOMY N/A 12/27/2018   Procedure: LEFT SIGMOID COLECTOMY WITH HARTMANN POUCH AND END COLOSTOMY;  Surgeon: [name], MD;  Location: [facility];  Service: General;  Laterality: N/A;      Social History:     Social History       Tobacco Use  . Smoking status: Never Smoker  . Smokeless tobacco: Never Used  Substance Use Topics  . Alcohol use: No     Lives -currently at [facility] for subacute rehab  Mobility -with assistance     Family History :          Family History  Problem Relation Age of Onset  . Heart attack Mother   . Heart attack Father      MEDICATIONS    acetaminophen 325 MG tablet Commonly known as:  TYLENOL Take 650 mg by mouth every 4 (four) hours as needed for mild pain. Do not exceed more than 3gm total in 24 hrs   amoxicillin-clavulanate 875-125 MG tablet Commonly known as:  AUGMENTIN Take 1 tablet by mouth every 12 (twelve) hours for 3 days.   apixaban 5 MG Tabs tablet Commonly known as:   Eliquis TAKE 1 TABLET (5 MG TOTAL) BY MOUTH 2 (TWO) TIMES DAILY. What changed:    how much to take  how to take this  when to take this  additional instructions   diltiazem 240 MG 24 hr capsule Commonly known as:  CARDIZEM CD Take 1 capsule (240 mg total) by mouth daily. What changed:    medication strength  how much to take   feeding supplement (ENSURE ENLIVE) Liqd Ensure Enlive one by mouth daily for supplement   furosemide 20 MG tablet Commonly known as:  Lasix Lasix 20 mg po daily PRN for weight of greater than 3 lbs in a day or 5 lbs in a week What changed:    how much to take  how to take this  when to take this  reasons to take this  additional instructions   insulin aspart 100 UNIT/ML injection Commonly known as:  novoLOG Inject 0-9 Units into the skin 3 (three) times daily with meals. Correction coverage: Sensitive (thin, NPO, renal) CBG < 70: implement hypoglycemia protocol CBG 70 - 120: 0 units CBG 121 - 150: 1 unit CBG 151 - 200: 2 units CBG 201 - 250: 3 units CBG 251 - 300: 5 units CBG 301 - 350: 7 units CBG 351 - 400 9 units CBG > 400 call MD and obtain STAT lab verification   insulin glargine 100 UNIT/ML injection Commonly known as:  LANTUS Inject 0.15 mLs (15 Units total) into the skin at bedtime. What changed:  how much to take   KP Ferrous Sulfate 325 (65 FE) MG tablet Generic drug:  ferrous sulfate Take 325 mg by mouth daily with breakfast.   Melatonin 3 MG Tabs Take 6 mg by mouth at bedtime.   ondansetron 8 MG tablet Commonly known as:  ZOFRAN Take 8 mg by mouth every 8 (eight) hours as needed for nausea.   oxybutynin 10 MG 24 hr tablet Commonly known as:  DITROPAN-XL Take 10 mg by mouth daily.   sertraline 100 MG tablet Commonly known as:  ZOLOFT Take 100 mg by mouth daily.   simvastatin 20 MG tablet Commonly known as:  ZOCOR Take 20 mg by mouth at bedtime.   traMADol 50 MG tablet Commonly known as:   ULTRAM Take 1 tablet (50 mg total) by mouth daily as needed. Take one tab a day prn pain--do NOT take within 12 hours of Zoloft administration!! What changed:    reasons to take this  additional instructions      Review of systems.  In general she is not complaining of any fever chills says she feels quite weak.  Skin does not complain of rashes or itching she does have drain placed abdominal as well as a surgical abdominal wound from previous surgery and a colostomy bag.  Head ears eyes nose mouth and throat is not complaining of any sore throat or visual changes.  Respiratory not complaining of any shortness of breath  or cough currently.  Cardiac does not complain of chest pain currently has fairly minimal edema.  GI has an extensive history here as noted above says she has some abdominal soreness but says this is not new she does have a colostomy bag.  GU does not complain of dysuria.  Musculoskeletal has weakness and does have chronic pain especially in her back.  Neurologic does not complain of dizziness headache or numbness does have weakness.  And psych does not complain of overt anxiety but does have somewhat of a flat affect and does appreciate having a psych consult for depression.  Physical exam.  Temperature is 98.0 pulse is 93 respirations 18 blood pressure 130/76 oxygen saturation is 92% on room air.  In general this is a somewhat weak appearing elderly female in no distress she appears to be resting comfortably in bed but is awake.  Her skin is warm and dry she does have a surgical wound that appears to have a beefy red erythematous wound bed did not appreciate an odor or any drainage or surrounding erythema.  She also has a drain in place left side of abdomen in addition to a colostomy site right side of abdomen the colostomy bag is currently being replaced she appears to have some skin irritation around the site but did not see signs of concerning  infection.  Eyes visual acuity appears to be intact sclera and conjunctive are clear.  Heart is irregular irregular rate and rhythm in the 90s without murmur gallop or rub she has fairly minimal lower extremity edema quite mild.  Abdomen as noted above-it is soft some mild tenderness to palpation she does not describe this as acute pain bowel sounds are positive.  Musculoskeletal Limited exam since she is in bed but able to move her extremities x4 at baseline with continued lower extremity weakness.  Neurologic as noted above her speech is clear could not appreciate lateralizing findings.  Psych she is alert and oriented pleasant and appropriate.  Labs.  February 09, 2019.  Sodium 138 potassium 5.1 BUN 24 creatinine 0.75.  February 08, 2019.  WBC 8.6 hemoglobin 8.4 platelets 323.  February 06, 2019.  Magnesium 1.8.  Assessment and plan.  1.  History of intra-abdominal abscess- this was evaluated by surgery and a drain was placed-blood culture was negative drain did grow out enterococcus sensitive to ampicillin- she is completing a course of Augmentin him initially was treated with Zosyn.  She will need follow-up by interventional radiology as well as by surgery.  2.  History of atrial fibrillation- her Cardizem was changed to 240 mg at discharge she did have some rapid ventricular rate in the hospital this appears to have moderated but will bear watching- she is on Eliquis as well for anticoagulation. She will need cardiology follow-up   3.-  History of anemia-she is on iron hemoglobin was down somewhat at 8.4 in the hospital-previously had been more in the 9 range will update this she is again on iron.  4.  History of type 2 diabetes she is on Lantus 15 units as well as NovoLog sliding scale with meals blood sugar so far of run in the 2 300 range would like to get more readings before making changes.  5.  History of diastolic CHF apparently she received Lasix in the hospital-at this  point appears to be well compensated but will need continued monitoring she does have an order for PRN Lasix for gain greater than 3 pounds of weight.  6.-  Depression again she continues on Zoloft- a psychiatric consult was pending but she went to the hospital in the interim I suspect this will have to be readdressed.  7.  History of chronic pain-she has tramadol once a day as well as as needed Tylenol she had been on Vicodin previously- n we have been trying to minimize her narcotic use will await Dr. Clayborn Heron input  Again will update blood work including a CBC and metabolic panel.  Again she will need follow-up by interventional radiology as well as surgery and cardiology  [phone] note greater than 40 minutes spent assessing patient-reviewing her chart and labs- discussing her status with nursing- and coordinating and formulating a plan of care-of note greater than 50% of time spent coordinating a plan of care with input as noted above

## 2019-02-11 ENCOUNTER — Encounter: Payer: Self-pay | Admitting: Internal Medicine

## 2019-02-11 ENCOUNTER — Non-Acute Institutional Stay (SKILLED_NURSING_FACILITY): Payer: Medicare HMO | Admitting: Internal Medicine

## 2019-02-11 DIAGNOSIS — C189 Malignant neoplasm of colon, unspecified: Secondary | ICD-10-CM | POA: Diagnosis not present

## 2019-02-11 DIAGNOSIS — C787 Secondary malignant neoplasm of liver and intrahepatic bile duct: Secondary | ICD-10-CM

## 2019-02-11 DIAGNOSIS — IMO0001 Reserved for inherently not codable concepts without codable children: Secondary | ICD-10-CM

## 2019-02-11 DIAGNOSIS — E119 Type 2 diabetes mellitus without complications: Secondary | ICD-10-CM

## 2019-02-11 DIAGNOSIS — T8149XA Infection following a procedure, other surgical site, initial encounter: Secondary | ICD-10-CM

## 2019-02-11 DIAGNOSIS — I4891 Unspecified atrial fibrillation: Secondary | ICD-10-CM

## 2019-02-11 DIAGNOSIS — Z794 Long term (current) use of insulin: Secondary | ICD-10-CM | POA: Diagnosis not present

## 2019-02-11 DIAGNOSIS — T8143XA Infection following a procedure, organ and space surgical site, initial encounter: Secondary | ICD-10-CM

## 2019-02-11 LAB — CBC AND DIFFERENTIAL
HCT: 31 — AB (ref 36–46)
Hemoglobin: 9.7 — AB (ref 12.0–16.0)
Platelets: 343 (ref 150–399)
WBC: 8.9

## 2019-02-11 LAB — BASIC METABOLIC PANEL
BUN: 19 (ref 4–21)
Creatinine: 0.7 (ref 0.5–1.1)
Glucose: 321
Potassium: 4.4 (ref 3.4–5.3)
Sodium: 140 (ref 137–147)

## 2019-02-11 NOTE — Assessment & Plan Note (Addendum)
Rate controlled with a total of 240 mg of diltiazem daily without associated significant bradycardia

## 2019-02-11 NOTE — Assessment & Plan Note (Addendum)
Significant hyperglycemia present Medical literature states sliding scale insulin coverage at SNF is to be avoided Change to NPH twice daily and monitor fasting and 2 hours after largest meal glucoses

## 2019-02-11 NOTE — Patient Instructions (Signed)
See assessment and plan under each diagnosis in the problem list and acutely for this visit 

## 2019-02-11 NOTE — Assessment & Plan Note (Addendum)
Continue Augmentin until 02/12/2019 Flush drain with 10 cc of sterile saline daily.  Repeat CT of abdomen/pelvis when daily drain output is less than 10-15 cc/day.

## 2019-02-11 NOTE — Progress Notes (Signed)
NURSING HOME LOCATION:  [facility] ROOM NUMBER:[id number]    CODE STATUS: Full Code   PCP:  [name] MD  This is a nursing facility follow up for [facility] readmission within 30 days  Interim medical record and care since last [facility] visit was updated with review of diagnostic studies and change in clinical status since last visit were documented.  HPI: Patient was rehospitalized 3/20-3/29/2020 for generalized weakness and abdominal pain.  Imaging revealed significant intra-abdominal abscess.  Zosyn was initiated and general surgery consulted.  Drainage of the abscess was completed by IR on 3/20.  Drain culture grew enterococcus faecalis sensitive to ampicillin .Zosyn was changed to oral antibiotics as per general surgery recommendation.  She was to continue Augmentin for total of 7 days with the last dose being 4/1.  The abscess was in the context of a recent colectomy and transverse end colostomy on 2/14. Drain was to be flushed with 10 cc of sterile saline daily;she was to be seen in the Drain Clinic/IR in follow-up.  Repeat CT of the abdomen/pelvis was to be completed when the drain output was less than 10-15 cc/day. She has a diagnosis of atrial flutter with rapid ventricular response.  Eliquis was initially held because of the invasive procedure.  Overnight 3/24 she developed A. fib with rapid ventricular response for which she received 5 mg of IV Cardizem in addition to her maintenance oral Cardizem.  She was continued on Cardizem 60 mg every 6 hours.  She did exhibit a bradycardia with heart rate as low as 40 on Cardizem at 90 mg.  Heart rate improved on the lower dose of 60 mg every 6 hours which was to be her maintenance dose post discharge.  Cardiology follow-up was to be scheduled as needed. She did develop right-sided chest pain with atelectasis and right pleural effusion.  Incentive spirometry was initiated.  Troponin was mildly elevated; cardiology did not  feel she had an acute ischemic event. Hypomagnesemia was resolved with supplements. She received IV Lasix for chronic diastolic heart failure with output of 8 L total.  Lasix was changed to as needed basis. Lantus &  sliding scale insulin were continued. Dr. Annamaria Boots will monitor her adenocarcinoma of the colon with associated imaging evidence of liver metastases.She states that she has 2 more chemotherapy treatments.  These were to be scheduled every other week but had to be deferred because of the acute issues which have intervened.  The last chemotherapy was in early February.  She seems to indicate that Dr.Fang is not sure that there are hepatic mets.  Indicated that "verification" was needed.  She is to see Dr. Hassell Done her surgeon in April but is unsure of the date.  She is anxious to have PT/OT to allow her to mobilize and return home. She has severe caloric malnutrition in the context of malignancy; albumin is 1.6.  This was also associated with a normochromic anemia.  Ferrous sulfate supplementation was continued.  On 3/31 hemoglobin 9.7/hematocrit 31.4, up from values of 8.4/29.3 on 3/28.  Review of systems: She describes low back pain, especially at night. For this she is receiving PT/OT here @ the SNF.  She states that the left lower extremity will not support her weight due to weakness.  This occurred after the colon surgery.  She denies any associated numbness and tingling or radicular symptoms.  She also has some discomfort in the left knee with swelling.  Constitutional: No fever, significant weight change, fatigue  Eyes: No redness, discharge, pain, vision change ENT/mouth: No nasal congestion,  purulent discharge, earache, change in hearing, sore throat  Cardiovascular: No chest pain, palpitations, paroxysmal nocturnal dyspnea, claudication, edema  Respiratory: No cough, sputum production, hemoptysis, DOE, significant snoring, apnea   Gastrointestinal: No heartburn, dysphagia, abdominal  pain, nausea /vomiting, rectal bleeding, melena, change in bowels Genitourinary: No dysuria, hematuria, pyuria, incontinence, nocturia Dermatologic: No rash, pruritus, change in appearance of skin Neurologic: No dizziness, headache, syncope, seizures, numbness, tingling Psychiatric: No significant anxiety, depression, insomnia, anorexia Endocrine: No change in hair/skin/nails, excessive thirst, excessive hunger, excessive urination  Hematologic/lymphatic: No significant bruising, lymphadenopathy, abnormal bleeding Allergy/immunology: No itchy/watery eyes, significant sneezing, urticaria, angioedema  Physical exam:  Pertinent or positive findings: 1 of the maxillary incisors is missing.  Heart rhythm and rate are irregular but rate is not accelerated.  Port-A-Cath right chest.  Breath sounds are decreased at the bases.  .  Abdomen is protuberant.  Bowel sounds are hyperactive.  Ostomy bag is present.  Pes planus is present.  Left knee reveals effusion and crepitus.  Range of motion and strength is markedly decreased in the left lower extremity.  She has tattoos over the dorsum of the feet.  General appearance:  no acute distress, increased work of breathing is present.   Lymphatic: No lymphadenopathy about the head, neck, axilla. Eyes: No conjunctival inflammation or lid edema is present. There is no scleral icterus. Ears:  External ear exam shows no significant lesions or deformities.   Nose:  External nasal examination shows no deformity or inflammation. Nasal mucosa are pink and moist without lesions, exudates Oral exam:  Lips and gums are healthy appearing. There is no oropharyngeal erythema or exudate. Neck:  No thyromegaly, masses, tenderness noted.    Heart:  N gallop, murmur, click, rub .  Lungs:  without wheezes, rhonchi, rales, rubs. Abdomen:  Abdomen is soft and nontender GU: Deferred  Extremities:  No cyanosis, clubbing, edema  Neurologic exam : Balance, Rhomberg, finger to nose  testing could not be completed due to clinical state Skin: Warm & dry w/o tenting. No significant lesions or rash.  See summary under each active problem in the Problem List with associated updated therapeutic plan

## 2019-02-11 NOTE — Assessment & Plan Note (Addendum)
She intimates that the presence of hepatic metastases is somewhat in question despite the imaging reports Verify oncologic treatment plan & discuss code status when patient receptive

## 2019-02-12 ENCOUNTER — Other Ambulatory Visit: Payer: Self-pay

## 2019-02-12 ENCOUNTER — Ambulatory Visit: Payer: Self-pay | Admitting: Hematology

## 2019-02-12 ENCOUNTER — Ambulatory Visit: Payer: Self-pay

## 2019-02-13 ENCOUNTER — Encounter: Payer: Self-pay | Admitting: Internal Medicine

## 2019-02-13 ENCOUNTER — Other Ambulatory Visit: Payer: Self-pay

## 2019-02-13 ENCOUNTER — Ambulatory Visit: Payer: Self-pay | Admitting: Hematology

## 2019-02-13 ENCOUNTER — Ambulatory Visit: Payer: Self-pay

## 2019-02-13 ENCOUNTER — Non-Acute Institutional Stay (SKILLED_NURSING_FACILITY): Payer: Medicare HMO | Admitting: Internal Medicine

## 2019-02-13 DIAGNOSIS — I4891 Unspecified atrial fibrillation: Secondary | ICD-10-CM | POA: Diagnosis not present

## 2019-02-13 DIAGNOSIS — T8143XA Infection following a procedure, organ and space surgical site, initial encounter: Secondary | ICD-10-CM

## 2019-02-13 DIAGNOSIS — T8149XA Infection following a procedure, other surgical site, initial encounter: Secondary | ICD-10-CM | POA: Diagnosis not present

## 2019-02-13 DIAGNOSIS — R509 Fever, unspecified: Secondary | ICD-10-CM | POA: Diagnosis not present

## 2019-02-13 NOTE — Progress Notes (Signed)
Location:   .[facility] Room Number: 350/K Place of Service:  SNF [phone]) Provider:  Edwinna Areola, MD  Patient Care Team: [name], MD as PCP - General (Family Medicine) Thompson Grayer, MD as PCP - Cardiology (Cardiology) Thompson Grayer, MD as PCP - Electrophysiology (Cardiology) Ronnette Juniper, MD as Consulting Physician (Gastroenterology) Truitt Merle, MD as Consulting Physician (Medical Oncology) Newt Minion, MD as Consulting Physician (Orthopedic Surgery) Leighton Ruff, MD as Consulting Physician (Colon and Rectal Surgery)  Extended Emergency Contact Information Primary Emergency Contact: [place] Phone: [phone] Relation: Son  Code Status:  Full Code Goals of care: Advanced Directive information Advanced Directives 02/13/2019  Does Patient Have a Medical Advance Directive? Yes  Type of Advance Directive (No Data)  Does patient want to make changes to medical advance directive? No - Patient declined  Would patient like information on creating a medical advance directive? No - Patient declined     Chief Complaint  Patient presents with   Acute Visit    Low grade fever    HPI:  Pt is a 73 y.o. female seen today for an acute visit for a low-grade elevated temperature.  Patient was hospitalized in late March for generalized weakness and abdominal pain imaging revealed a significant intra-abdominal abscess- she was initially treated with Zosyn and general surgery was consulted-draining of the abscess was completed by interventional radiology- drain culture grew out enterococcus faecalis   She completed a course of Augmentin yesterday.  Abscess was in light of a recent colectomy and transverse end colostomy on February 14.  She is supposed to be followed up by interventional radiology in the drain clinic-she also is supposed to have a repeat CT of the abdomen and pelvis  when the drain output is less than 10 to 15 mL/day.  She has a diagnosis as well of atrial flutter with rapid ventricular response she is on Eliquis.  Apparently she developed rapid A. fib in the hospital and she received IV Cardizem-she has been continued on Cardizem 240 mg once a day extended release.  .  She also received IV Lasix for chronic diastolic CHF her weight has been stable during her admission here she has a PRN Lasix order.  She does have anemia as well she is on iron and hemoglobin has shown improvement at 9.7 on updated lab.  Apparently today she has been running a low-grade elevated temperature in the 99's range ranging from 99 4-99 9-she denies any increased abdominal pain actually says this feels better denies fever chills cough or dysuria she says she is gradually feeling somewhat better.  Currently she is sitting in her wheelchair comfortably appears somewhat more energetic than when I saw her earlier this week again says she is feeling somewhat better but still very weak.    Past Medical History:  Diagnosis Date   Acute diastolic heart failure ([place]) 12/22/2018   Arthritis    Atrial fibrillation, chronic 12/22/2018   Cancer of left colon ([place]) 10/30/2018   Cancer of sigmoid colon  12/27/2018   Diabetes mellitus without complication ([place])    Hypertension    Obesity (BMI 30-39.9) 12/27/2018   Past Surgical History:  Procedure Laterality Date   COLONOSCOPY  10/2018   Dr [name].  Large cancer at splenic flexure,  Bulky sigmoid colon mass, Numerous polyps   Intra-abdominal abscess drainage  01/31/2019   Abscess drainage grew Enterococcus faecalis  IR IMAGING GUIDED PORT INSERTION  11/20/2018   LAPAROTOMY N/A 12/27/2018   Procedure: LEFT SIGMOID COLECTOMY WITH HARTMANN POUCH AND END COLOSTOMY;  Surgeon: [name], MD;  Location: [facility];  Service: General;  Laterality: N/A;    Allergies  Allergen Reactions   Metformin And Related Nausea And Vomiting    Prednisone Nausea And Vomiting    Outpatient Encounter Medications as of 02/13/2019  Medication Sig   acetaminophen (TYLENOL) 500 MG tablet Take 500 mg by mouth every 4 (four) hours as needed. FOR PAIN OR TEMP.   apixaban (ELIQUIS) 5 MG TABS tablet TAKE 1 TABLET (5 MG TOTAL) BY MOUTH 2 (TWO) TIMES DAILY.   diltiazem (CARDIZEM CD) 240 MG 24 hr capsule Take 1 capsule (240 mg total) by mouth daily.   feeding supplement, ENSURE ENLIVE, (ENSURE ENLIVE) LIQD Take 1 Bottle by mouth daily.   ferrous sulfate (KP FERROUS SULFATE) 325 (65 FE) MG tablet Take 325 mg by mouth daily with breakfast.   furosemide (LASIX) 20 MG tablet Give one by mouth daily as needed for wt gain of 3 lbs in 1 day or 5 lbs in 1 week   Insulin NPH, Human,, Isophane, (HUMULIN N KWIKPEN) 100 UNIT/ML Kiwkpen INJECT 12 UNITS SUBCUTANEOUSLY BEFORE BREAKFAST AND EVENING MEALS   Melatonin 3 MG TABS Take 6 mg by mouth at bedtime.    NON FORMULARY NSA Med Pass 120 ml po daily   ondansetron (ZOFRAN) 8 MG tablet Take 8 mg by mouth every 8 (eight) hours as needed for nausea.   oxybutynin (DITROPAN-XL) 10 MG 24 hr tablet Take 10 mg by mouth daily.   sertraline (ZOLOFT) 100 MG tablet Take 100 mg by mouth daily.    simvastatin (ZOCOR) 20 MG tablet Take 20 mg by mouth at bedtime.   traMADol (ULTRAM) 50 MG tablet Take 1 tablet (50 mg total) by mouth daily as needed. Take one tab a day prn pain--do NOT take within 12 hours of Zoloft administration!!   acetaminophen (TYLENOL) 325 MG tablet Take 650 mg by mouth every 4 (four) hours as needed for mild pain. Do not exceed more than 3gm total in 24 hrs   [DISCONTINUED] insulin aspart (NOVOLOG FLEXPEN) 100 UNIT/ML FlexPen SLIDING SCALE 70-120:0 units, 121-150: 1U, 151-200: 2U,201-250: 3U,251-300:5U,301-350:7U,351-400:9U,CBG >400mg /dL call MD   [DISCONTINUED] insulin glargine (LANTUS) 100 UNIT/ML injection Inject 0.15 mLs (15 Units total) into the skin at bedtime.   No  facility-administered encounter medications on file as of 02/13/2019.     Review of Systems   In general she is not complaining of any fever or chills.  Skin does not complain of rashes itching or diaphoresis.  Head ears eyes nose mouth and throat does not complain of any visual changes or sore throat.  Respiratory denies shortness of breath or cough.  Cardiac denies chest pain appears to have fairly minimal edema.  GU denies dysuria.  GI is not complaining of abdominal discomfort nausea vomiting diarrhea constipation she does have a colostomy bag says her abdominal pain is somewhat improved.  Musculoskeletal continues to complain of weakness and at times back pain she says the Tylenol the weight schedule now is helping.  Neurologic does not complain of dizziness headache or numbness is positive for weakness.  And psych does have some history of depression she would like a psychiatric consult with which we actually ordered before she was readmitted to the hospital we will rewrite this order she is on Zoloft for depression.    Immunization History  Administered Date(s) Administered   Influenza-Unspecified 07/24/2018   Pertinent  Health Maintenance Due  Topic Date Due   FOOT EXAM  02/13/2019 (Originally 11/07/1956)   OPHTHALMOLOGY EXAM  02/13/2019 (Originally 11/07/1956)   URINE MICROALBUMIN  02/13/2019 (Originally 11/07/1956)   DEXA SCAN  02/13/2019 (Originally 11/08/2011)   PNA vac Low Risk Adult (1 of 2 - PCV13) 02/13/2019 (Originally 11/08/2011)   HEMOGLOBIN A1C  03/13/2019 (Originally 02/07/2019)   MAMMOGRAM  03/15/2019 (Originally 11/07/1996)   INFLUENZA VACCINE  06/14/2019   COLONOSCOPY  10/25/2028   No flowsheet data found. Functional Status Survey:   Temperature again varies between 99 3-99 9-pulse of 90 respirations 20 blood pressure 135/86 oxygen saturation is 94% on room air Physical Exam In general this is a pleasant elderly female in no distress  sitting comfortably in her wheelchair.  Her skin is warm and dry she does have tattoos on her feet- surgical sites abdomen per nursing appear benign without signs of infection they are currently covered.  Eyes visual acuity intact sclera and conjunctive are clear.  Oropharynx is clear mucous membranes moist.  Chest is clear to auscultation with somewhat reduced air entry at the bases no labored breathing.  Heart is irregular irregular rate and rhythm I got a rate around 90 she has scant lower extremity edema.  Abdomen is protuberant she does have a colostomy in place as well as surgical wound that is currently covered and a drainage site which is currently covered.  Per discussion with nursing that changed the dressing earlier today the area looks benign.  Musculoskeletal does have lower extremity weakness especially left lower extremity.  Moves her upper extremities at baseline.  Neurologic as noted above her speech is clear cannot really appreciate true lateralizing findings.  Psych she is alert and oriented pleasant and appropriate   Labs reviewed:  Labs.  February 11, 2019.  WBC 8.9 hemoglobin 9.7 platelets 343.  Sodium 140 potassium 4.4 BUN 18.9 creatinine 0.6.   Recent Labs    01/01/19 0447 01/02/19 0518 01/03/19 0259  02/03/19 0129 02/05/19 0444 02/06/19 0247 02/07/19 0629 02/08/19 0301 02/09/19 0741  NA 146* 143 139   < > 137 137 137 139 137 138  K 4.1 4.0 4.5   < > 4.1 4.2 4.8 4.8 4.7 5.1  CL 105 101 96*   < > 104 106 100 100 103 99  CO2 33* 34* 34*   < > 26 28 27 30 26 31   GLUCOSE 214* 181* 290*   < > 188* 144* 211* 161* 300* 132*  BUN 31* 25* 27*   < > 17 15 16 21  24* 24*  CREATININE 0.68 0.61 0.74   < > 0.91 0.70 0.72 1.09* 0.99 0.75  CALCIUM 7.8* 7.4* 7.4*   < > 7.6* 7.8* 8.2* 8.7* 8.3* 8.7*  MG 2.0 1.4* 1.7   < > 2.0 1.6* 1.8  --   --   --   PHOS 2.3* 3.1 3.4  --   --   --   --   --   --   --    < > = values in this interval not displayed.    Recent Labs    12/30/18 0319 12/31/18 0337  01/20/19 01/23/19 01/31/19 1301  AST 21 20  --  21 12* 11*  ALT 23 20  --  33 20 15  ALKPHOS 87 84  --   --   --  234*  BILITOT 0.9 0.6  --   --   --  0.5  PROT 4.4* 4.7*  --  4.8* 5.3*   5.3* 5.3*  ALBUMIN 1.5* 1.5*   < > 2.2 2.4   2.4 1.6*   < > = values in this interval not displayed.   Recent Labs    01/31/19 1301  02/02/19 0318  02/05/19 0444 02/06/19 0247 02/08/19 0301  WBC 8.6   < > 6.1   < > 9.2 10.2 8.6  NEUTROABS 7.0  --  4.4  --  6.9  --   --   HGB 8.8*   < > 8.0*   < > 8.0* 8.6* 8.4*  HCT 28.9*   < > 25.9*   < > 28.6* 28.4* 29.3*  MCV 91.5   < > 90.2   < > 93.5 92.2 93.6  PLT 366   < > 346   < > 355 375 323   < > = values in this interval not displayed.   Lab Results  Component Value Date   TSH 1.894 01/03/2019   Lab Results  Component Value Date   HGBA1C 5.6 08/09/2018   Lab Results  Component Value Date   CHOL 125 08/09/2018   HDL 30 (L) 08/09/2018   LDLCALC 68 08/09/2018   TRIG 202 (H) 12/31/2018   CHOLHDL 4.2 08/09/2018    Significant Diagnostic Results in last 30 days:  Ct Head Wo Contrast  Result Date: 01/31/2019 CLINICAL DATA:  Muscle weakness, left lower extremity weakness EXAM: CT HEAD WITHOUT CONTRAST TECHNIQUE: Contiguous axial images were obtained from the base of the skull through the vertex without intravenous contrast. COMPARISON:  None. FINDINGS: Brain: No acute territorial infarction, hemorrhage or intracranial mass. Mild atrophy. Mild small vessel ischemic changes of the white matter. Vascular: No hyperdense vessels.  Carotid vascular calcification. Skull: Normal. Negative for fracture or focal lesion. Sinuses/Orbits: No acute finding. Other: None IMPRESSION: 1. No CT evidence for acute intracranial abnormality. 2. Atrophy and mild small vessel ischemic changes of the white matter Electronically Signed   By: Donavan Foil M.D.   On: 01/31/2019 15:54   Ct Abdomen Pelvis W Contrast  Result  Date: 01/31/2019 CLINICAL DATA:  Post laparoscopic procedure with sigmoid colectomy for colon cancer. Diffuse hepatic masses. Two chemotherapy treatments. EXAM: CT ABDOMEN AND PELVIS WITH CONTRAST TECHNIQUE: Multidetector CT imaging of the abdomen and pelvis was performed using the standard protocol following bolus administration of intravenous contrast. CONTRAST:  138mL ISOVUE-300 IOPAMIDOL (ISOVUE-300) INJECTION 61% COMPARISON:  01/05/2019, 12/26/2018 and 11/01/2018 FINDINGS: Lower chest: Lung bases demonstrate worsening moderate size left effusion with associated basilar atelectasis. Right lung base is clear. Hepatobiliary: Several hypodense masses with the largest over the caudate lobe measuring 2.6 cm without significant change likely metastatic disease. Gallbladder and biliary tree are within normal. Pancreas: Normal. Spleen: Few small stable subcentimeter hypodensities. Adrenals/Urinary Tract: Right adrenal gland is normal. 2.7 x 3.2 cm left adrenal mass without significant change likely metastatic disease. Kidneys are normal size without hydronephrosis or nephrolithiasis. Subcentimeter hypodensity over the upper pole right renal cortex too small to characterize but likely a cyst. Ureters and bladder are normal. Stomach/Bowel: Stomach is within normal. Small bowel is unremarkable. Appendix not visualized. Evidence of patient's previous partial colectomy with ostomy site over the right anterior abdominal wall. Evidence of Hartmann's pouch. Vascular/Lymphatic: Mild calcified plaque over the abdominal aorta. No adenopathy. Reproductive: Unremarkable. Other: There is a large fluid collection containing multiple foci of air with thin rim enhancement over the central lower abdomen extending into the left mid  to upper abdomen measuring approximately 12.6 x 24.6 x 18.4 cm and AP, transverse and craniocaudal dimensions. This likely represents a intra-abdominal abscess. There is an oval area over the omentum in the  anterior midline upper abdomen measuring 8.4 x 16.6 cm containing fluid and multiple collections of air likely additional site of infection. There is mild ascites. Small right inguinal hernia containing only peritoneal fat. Musculoskeletal: Degenerative change of the spine and hips. IMPRESSION: Postsurgical change compatible recent partial colectomy for colon cancer with Hartmann's pouch and right abdominal wall colostomy. There is a large rim enhancing fluid collection containing multiple foci of air over the lower abdomen extending into the left mid to upper abdomen measuring 12.6 x 24.6 x 18.4 cm likely an intra-abdominal abscess. There is an oval collection over the anterior midline upper abdomen over the omentum containing fat, fluid and multiple foci of air measuring 8.4 x 16.6 cm likely a second site of infection. Mild ascites. Several liver hypodensities with the largest over the caudate lobe measuring 2.6 cm without significant change likely metastatic disease. 3.2 cm left adrenal mass without significant change likely metastatic disease. Several small splenic hypodensities unchanged and likely benign, although metastatic disease is possible. Worsening moderate size left pleural effusion with associated basilar atelectasis. Subcentimeter right renal cortical hypodensity too small to characterize but likely a cyst. Small right inguinal hernia containing only peritoneal fat. Aortic Atherosclerosis (ICD10-I70.0). These results were called by telephone at the time of interpretation on 01/31/2019 at 11:18 am to Dr. Gurney Maxin, who verbally acknowledged these results. Electronically Signed   By: Marin Olp M.D.   On: 01/31/2019 11:16   Dg Chest Port 1 View  Result Date: 02/05/2019 CLINICAL DATA:  Onset chest pain this morning. Atrial fibrillation with rapid ventricular response. EXAM: PORTABLE CHEST 1 VIEW COMPARISON:  Single-view of the chest 12/31/2018. PA and lateral chest 07/10/2018. FINDINGS: There  is a small to moderate left pleural effusion and basilar airspace disease. No right effusion. The right lung is clear. Cardiomegaly is identified. Aortic atherosclerosis is seen. Right IJ approach Port-A-Cath is unchanged since the most recent exam. IMPRESSION: Small to moderate left pleural effusion and basilar airspace disease which could be atelectasis or pneumonia. Cardiomegaly without edema. Electronically Signed   By: Inge Rise M.D.   On: 02/05/2019 09:44   [place] Image Guided Fluid Drain By Catheter  Result Date: 02/06/2019 INDICATION: 73 year old female with a history intra-abdominal fluid collection EXAM: ULTRASOUND-GUIDED DRAINAGE ABDOMINAL FLUID COLLECTION MEDICATIONS: The patient is currently admitted to the hospital and receiving intravenous antibiotics. The antibiotics were administered within an appropriate time frame prior to the initiation of the procedure. ANESTHESIA/SEDATION: Fentanyl 50 mcg IV; Versed 2.0 mg IV Moderate Sedation Time:  20 minutes The patient was continuously monitored during the procedure by the interventional radiology nurse under my direct supervision. COMPLICATIONS: None PROCEDURE: Informed written consent was obtained from the patient after a thorough discussion of the procedural risks, benefits and alternatives. All questions were addressed. Maximal Sterile Barrier Technique was utilized including caps, mask, sterile gowns, sterile gloves, sterile drape, hand hygiene and skin antiseptic. A timeout was performed prior to the initiation of the procedure. Patient positioned supine position on the ultrasound table. Ultrasound images of the abdomen performed with images stored sent to PACs. Patient is prepped and draped in the usual sterile fashion. 1% lidocaine was used for local anesthesia. Ultrasound guidance was used to place a 12 [place] biliary drain 45 cm using modified Seldinger technique. Aspirate was for  culture. Catheter was sutured in position and attached to  bulb drainage. Patient tolerated the procedure well and remained hemodynamically stable throughout. No complications were encountered and no significant blood loss. IMPRESSION: Status post ultrasound-guided abdominal fluid drainage. Signed, Dulcy Fanny. Dellia Nims, RPVI [facility] Electronically Signed   By: Corrie Mckusick D.O.   On: 02/06/2019 07:46    Assessment/Plan   #1 somewhat elevated temperature- she does not show any acute signs of infection she says she is feeling better actually denies fever chills any increased cough congestion or dysuria- says her abdominal discomfort has improved-per discussion with nursing her wounds look stable-at this point will monitor with vital signs pulse ox every 4 hours for 24 hours.  Also will update lab work tomorrow including a CBC with differential-clinically she actually appears to be improving despite her fragile status.  2.  History of postoperative abscess again she is completed her antibiotic as of yesterday she says her abdominal pain actually appears to be improved again will monitor vital signs closely in light of somewhat elevated temperature.  3.-  History of atrial fibrillation-this appears rate controlled on current dose of Cardizem 240 mg a day at this point will monitor.  [id number]

## 2019-02-14 ENCOUNTER — Encounter: Payer: Self-pay | Admitting: Internal Medicine

## 2019-02-14 ENCOUNTER — Non-Acute Institutional Stay (SKILLED_NURSING_FACILITY): Payer: Medicare HMO | Admitting: Internal Medicine

## 2019-02-14 DIAGNOSIS — R509 Fever, unspecified: Secondary | ICD-10-CM

## 2019-02-14 DIAGNOSIS — I4891 Unspecified atrial fibrillation: Secondary | ICD-10-CM | POA: Diagnosis not present

## 2019-02-14 LAB — BASIC METABOLIC PANEL
BUN: 25 — AB (ref 4–21)
Creatinine: 0.7 (ref 0.5–1.1)
Glucose: 110
Potassium: 4.3 (ref 3.4–5.3)
Sodium: 143 (ref 137–147)

## 2019-02-14 NOTE — Progress Notes (Signed)
Location:    [facility] Room Number: 616/W Place of Service:  SNF [phone]) Provider:  Edwinna Areola, MD  Patient Care Team: [name], MD as PCP - General (Family Medicine) Thompson Grayer, MD as PCP - Cardiology (Cardiology) Thompson Grayer, MD as PCP - Electrophysiology (Cardiology) Ronnette Juniper, MD as Consulting Physician (Gastroenterology) Truitt Merle, MD as Consulting Physician (Medical Oncology) Newt Minion, MD as Consulting Physician (Orthopedic Surgery) Leighton Ruff, MD as Consulting Physician (Colon and Rectal Surgery)  Extended Emergency Contact Information Primary Emergency Contact: [place] Phone: [phone] Relation: Son  Code Status:  Full Code Goals of care: Advanced Directive information Advanced Directives 02/14/2019  Does Patient Have a Medical Advance Directive? Yes  Type of Advance Directive (No Data)  Does patient want to make changes to medical advance directive? No - Patient declined  Would patient like information on creating a medical advance directive? No - Patient declined     Chief Complaint  Patient presents with   Acute Visit    F/U Low grade fever    HPI:  Pt is a 73 y.o. female seen today for an acute visit for follow-up of a mildly elevated temperature yesterday.  Patient was seen yesterday for a low-grade elevated temperature- she however denied any fever chills shortness of breath cough or dysuria per nursing her abdominal wound appeared to be benign as well as the colostomy site in drain site.  Patient was hospitalized in late March for generalized weakness and abdominal pain imaging revealed a significant intra-abdominal abscess- she was initially treated with Zosyn and general surgery was consulted-draining of the abscess was completed by interventional radiology- drain culture grew out enterococcus faecalis   She completed a course of  Augmentin earlier this week.  Abscess was in light of a recent colectomy and transverse end colostomy on February 14.  She is supposed to be followed up by interventional radiology in the drain clinic-she also is supposed to have a repeat CT of the abdomen and pelvis when the drain output is less than 10 to 15 mL/day.  She had run and slightly elevated temperature in the high 99's area yesterday again she appeared to be asymptomatic as noted above today she is afebrile and continues did not really have any complaints in this regards.  She is lying in bed this morning she appears comfortable vital signs appear stable her temperature is 98.1.  She also has a history of atrial fibrillation in the hospital received IV Cardizem she is on Cardizem 240 a day this appears rate controlled with pulses it appears to be in the 90s this will have to be watched.    Past Medical History:  Diagnosis Date   Acute diastolic heart failure ([place]) 12/22/2018   Arthritis    Atrial fibrillation, chronic 12/22/2018   Cancer of left colon ([place]) 10/30/2018   Cancer of sigmoid colon  12/27/2018   Diabetes mellitus without complication ([place])    Hypertension    Obesity (BMI 30-39.9) 12/27/2018   Past Surgical History:  Procedure Laterality Date   COLONOSCOPY  10/2018   Dr [name].  Large cancer at splenic flexure,  Bulky sigmoid colon mass, Numerous polyps   Intra-abdominal abscess drainage  01/31/2019   Abscess drainage grew Enterococcus faecalis   IR IMAGING GUIDED PORT INSERTION  11/20/2018   LAPAROTOMY N/A 12/27/2018   Procedure: LEFT SIGMOID COLECTOMY WITH HARTMANN POUCH AND END  COLOSTOMY;  Surgeon: [name], MD;  Location: [facility]S;  Service: General;  Laterality: N/A;    Allergies  Allergen Reactions   Metformin And Related Nausea And Vomiting   Prednisone Nausea And Vomiting    Outpatient Encounter Medications as of 02/14/2019  Medication Sig   acetaminophen (TYLENOL) 500 MG tablet  Take 500 mg by mouth every 4 (four) hours as needed. FOR PAIN OR TEMP.   apixaban (ELIQUIS) 5 MG TABS tablet TAKE 1 TABLET (5 MG TOTAL) BY MOUTH 2 (TWO) TIMES DAILY.   diltiazem (CARDIZEM CD) 240 MG 24 hr capsule Take 1 capsule (240 mg total) by mouth daily.   feeding supplement, ENSURE ENLIVE, (ENSURE ENLIVE) LIQD Take 1 Bottle by mouth daily.   ferrous sulfate (KP FERROUS SULFATE) 325 (65 FE) MG tablet Take 325 mg by mouth daily with breakfast.   furosemide (LASIX) 20 MG tablet Give one by mouth daily as needed for wt gain of 3 lbs in 1 day or 5 lbs in 1 week   Insulin NPH, Human,, Isophane, (HUMULIN N KWIKPEN) 100 UNIT/ML Kiwkpen INJECT 12 UNITS SUBCUTANEOUSLY BEFORE BREAKFAST AND EVENING MEALS   Melatonin 3 MG TABS Take 6 mg by mouth at bedtime.    NON FORMULARY NSA Med Pass 120 ml po daily   ondansetron (ZOFRAN) 8 MG tablet Take 8 mg by mouth every 8 (eight) hours as needed for nausea.   oxybutynin (DITROPAN-XL) 10 MG 24 hr tablet Take 10 mg by mouth daily.   sertraline (ZOLOFT) 100 MG tablet Take 100 mg by mouth daily.    simvastatin (ZOCOR) 20 MG tablet Take 20 mg by mouth at bedtime.   traMADol (ULTRAM) 50 MG tablet GIVE 1 TABLET BY MOUTH AT BEDTIME (do not take within 12 hrs of taken zoloft)   [DISCONTINUED] acetaminophen (TYLENOL) 325 MG tablet Take 650 mg by mouth every 4 (four) hours as needed for mild pain. Do not exceed more than 3gm total in 24 hrs   [DISCONTINUED] traMADol (ULTRAM) 50 MG tablet Take 1 tablet (50 mg total) by mouth daily as needed. Take one tab a day prn pain--do NOT take within 12 hours of Zoloft administration!! (Patient taking differently: Take 50 mg by mouth daily as needed. Take one tab a day at bedtime prn pain--do NOT take within 12 hours of Zoloft administration!!)   No facility-administered encounter medications on file as of 02/14/2019.     Review of Systems   General she is not complaining of any fever or chills.  Skin no complaints of  rashes or itching she does have an abdominal wound colostomy site and drain site.  Head ears eyes nose mouth and throat is not complaining of visual changes or sore throat.  Respiratory is not complaining of shortness of breath or cough.  Cardiac does not complain of chest pain continues to have minimal edema.  GU is not complaining of dysuria.  GI is not really complaining of significant abdominal discomfort has some postop soreness but does not say this is increasing does not complain of nausea vomiting she does have a colostomy.  Musculoskeletal has weakness and at times complains of some joint back pain but she says the Tylenol appears to be working.  Neurologic is not complaining of dizziness headache or numbness.  And psych does have some history of depression she is on Zoloft and psychiatric consult is pending this appears fairly baseline  Immunization History  Administered Date(s) Administered   Influenza-Unspecified 07/24/2018   Pertinent  Health Maintenance Due  Topic Date Due   HEMOGLOBIN A1C  03/13/2019 (Originally 02/07/2019)   MAMMOGRAM  03/15/2019 (Originally 11/07/1996)   FOOT EXAM  03/16/2019 (Originally 11/07/1956)   OPHTHALMOLOGY EXAM  03/16/2019 (Originally 11/07/1956)   URINE MICROALBUMIN  03/16/2019 (Originally 11/07/1956)   DEXA SCAN  03/16/2019 (Originally 11/08/2011)   PNA vac Low Risk Adult (1 of 2 - PCV13) 03/16/2019 (Originally 11/08/2011)   INFLUENZA VACCINE  06/14/2019   COLONOSCOPY  10/25/2028   No flowsheet data found. Functional Status Survey:    Vitals:   02/14/19 1107  BP: (!) 143/80  Pulse: 96  Resp: 20  Temp: 98.1 F (36.7 C)  TempSrc: Oral    Physical Exam   In general this is a pleasant elderly female in no distress lying comfortably in bed.  Skin is warm and dry surgical site midabdomen is currently covered per nursing continues to be stable without signs of infection she also has a colostomy in place and a drainage  site drain site is currently covered as well.  Eyes visual acuity is intact sclera and conjunctive are clear.  Chest is clear to auscultation there is no labored breathing.  Heart is irregular irregular rate and rhythm in the 90s she has fairly minimal lower extremity edema.  Abdomen is soft nontender with positive bowel sounds.  Musculoskeletal moves extremities x4 with lower extremity weakness at baseline more so left lower extremity.  Neurologic is grossly intact her speech is clear could not really appreciate true lateralizing findings.  Psych she is alert and oriented pleasant and appropriate  Labs reviewed:   April third 2000 20.  Sodium 143 potassium 4.3 BUN 24 creatinine 0.71.   Recent Labs    01/01/19 0447 01/02/19 0518 01/03/19 0259  02/03/19 0129 02/05/19 0444 02/06/19 0247 02/07/19 0629 02/08/19 0301 02/09/19 0741  NA 146* 143 139   < > 137 137 137 139 137 138  K 4.1 4.0 4.5   < > 4.1 4.2 4.8 4.8 4.7 5.1  CL 105 101 96*   < > 104 106 100 100 103 99  CO2 33* 34* 34*   < > 26 28 27 30 26 31   GLUCOSE 214* 181* 290*   < > 188* 144* 211* 161* 300* 132*  BUN 31* 25* 27*   < > 17 15 16 21  24* 24*  CREATININE 0.68 0.61 0.74   < > 0.91 0.70 0.72 1.09* 0.99 0.75  CALCIUM 7.8* 7.4* 7.4*   < > 7.6* 7.8* 8.2* 8.7* 8.3* 8.7*  MG 2.0 1.4* 1.7   < > 2.0 1.6* 1.8  --   --   --   PHOS 2.3* 3.1 3.4  --   --   --   --   --   --   --    < > = values in this interval not displayed.   Recent Labs    12/30/18 0319 12/31/18 0337  01/20/19 01/23/19 01/31/19 1301  AST 21 20  --  21 12* 11*  ALT 23 20  --  33 20 15  ALKPHOS 87 84  --   --   --  234*  BILITOT 0.9 0.6  --   --   --  0.5  PROT 4.4* 4.7*  --  4.8* 5.3*   5.3* 5.3*  ALBUMIN 1.5* 1.5*   < > 2.2 2.4   2.4 1.6*   < > = values in this interval not displayed.   Recent Labs  01/31/19 1301  02/02/19 0318  02/05/19 0444 02/06/19 0247 02/08/19 0301  WBC 8.6   < > 6.1   < > 9.2 10.2 8.6  NEUTROABS 7.0  --  4.4   --  6.9  --   --   HGB 8.8*   < > 8.0*   < > 8.0* 8.6* 8.4*  HCT 28.9*   < > 25.9*   < > 28.6* 28.4* 29.3*  MCV 91.5   < > 90.2   < > 93.5 92.2 93.6  PLT 366   < > 346   < > 355 375 323   < > = values in this interval not displayed.   Lab Results  Component Value Date   TSH 1.894 01/03/2019   Lab Results  Component Value Date   HGBA1C 5.6 08/09/2018   Lab Results  Component Value Date   CHOL 125 08/09/2018   HDL 30 (L) 08/09/2018   LDLCALC 68 08/09/2018   TRIG 202 (H) 12/31/2018   CHOLHDL 4.2 08/09/2018    Significant Diagnostic Results in last 30 days:  Ct Head Wo Contrast  Result Date: 01/31/2019 CLINICAL DATA:  Muscle weakness, left lower extremity weakness EXAM: CT HEAD WITHOUT CONTRAST TECHNIQUE: Contiguous axial images were obtained from the base of the skull through the vertex without intravenous contrast. COMPARISON:  None. FINDINGS: Brain: No acute territorial infarction, hemorrhage or intracranial mass. Mild atrophy. Mild small vessel ischemic changes of the white matter. Vascular: No hyperdense vessels.  Carotid vascular calcification. Skull: Normal. Negative for fracture or focal lesion. Sinuses/Orbits: No acute finding. Other: None IMPRESSION: 1. No CT evidence for acute intracranial abnormality. 2. Atrophy and mild small vessel ischemic changes of the white matter Electronically Signed   By: Donavan Foil M.D.   On: 01/31/2019 15:54   Ct Abdomen Pelvis W Contrast  Result Date: 01/31/2019 CLINICAL DATA:  Post laparoscopic procedure with sigmoid colectomy for colon cancer. Diffuse hepatic masses. Two chemotherapy treatments. EXAM: CT ABDOMEN AND PELVIS WITH CONTRAST TECHNIQUE: Multidetector CT imaging of the abdomen and pelvis was performed using the standard protocol following bolus administration of intravenous contrast. CONTRAST:  125mL ISOVUE-300 IOPAMIDOL (ISOVUE-300) INJECTION 61% COMPARISON:  01/05/2019, 12/26/2018 and 11/01/2018 FINDINGS: Lower chest: Lung bases  demonstrate worsening moderate size left effusion with associated basilar atelectasis. Right lung base is clear. Hepatobiliary: Several hypodense masses with the largest over the caudate lobe measuring 2.6 cm without significant change likely metastatic disease. Gallbladder and biliary tree are within normal. Pancreas: Normal. Spleen: Few small stable subcentimeter hypodensities. Adrenals/Urinary Tract: Right adrenal gland is normal. 2.7 x 3.2 cm left adrenal mass without significant change likely metastatic disease. Kidneys are normal size without hydronephrosis or nephrolithiasis. Subcentimeter hypodensity over the upper pole right renal cortex too small to characterize but likely a cyst. Ureters and bladder are normal. Stomach/Bowel: Stomach is within normal. Small bowel is unremarkable. Appendix not visualized. Evidence of patient's previous partial colectomy with ostomy site over the right anterior abdominal wall. Evidence of Hartmann's pouch. Vascular/Lymphatic: Mild calcified plaque over the abdominal aorta. No adenopathy. Reproductive: Unremarkable. Other: There is a large fluid collection containing multiple foci of air with thin rim enhancement over the central lower abdomen extending into the left mid to upper abdomen measuring approximately 12.6 x 24.6 x 18.4 cm and AP, transverse and craniocaudal dimensions. This likely represents a intra-abdominal abscess. There is an oval area over the omentum in the anterior midline upper abdomen measuring 8.4 x 16.6 cm containing fluid and  multiple collections of air likely additional site of infection. There is mild ascites. Small right inguinal hernia containing only peritoneal fat. Musculoskeletal: Degenerative change of the spine and hips. IMPRESSION: Postsurgical change compatible recent partial colectomy for colon cancer with Hartmann's pouch and right abdominal wall colostomy. There is a large rim enhancing fluid collection containing multiple foci of air  over the lower abdomen extending into the left mid to upper abdomen measuring 12.6 x 24.6 x 18.4 cm likely an intra-abdominal abscess. There is an oval collection over the anterior midline upper abdomen over the omentum containing fat, fluid and multiple foci of air measuring 8.4 x 16.6 cm likely a second site of infection. Mild ascites. Several liver hypodensities with the largest over the caudate lobe measuring 2.6 cm without significant change likely metastatic disease. 3.2 cm left adrenal mass without significant change likely metastatic disease. Several small splenic hypodensities unchanged and likely benign, although metastatic disease is possible. Worsening moderate size left pleural effusion with associated basilar atelectasis. Subcentimeter right renal cortical hypodensity too small to characterize but likely a cyst. Small right inguinal hernia containing only peritoneal fat. Aortic Atherosclerosis (ICD10-I70.0). These results were called by telephone at the time of interpretation on 01/31/2019 at 11:18 am to Dr. Gurney Maxin, who verbally acknowledged these results. Electronically Signed   By: Marin Olp M.D.   On: 01/31/2019 11:16   Dg Chest Port 1 View  Result Date: 02/05/2019 CLINICAL DATA:  Onset chest pain this morning. Atrial fibrillation with rapid ventricular response. EXAM: PORTABLE CHEST 1 VIEW COMPARISON:  Single-view of the chest 12/31/2018. PA and lateral chest 07/10/2018. FINDINGS: There is a small to moderate left pleural effusion and basilar airspace disease. No right effusion. The right lung is clear. Cardiomegaly is identified. Aortic atherosclerosis is seen. Right IJ approach Port-A-Cath is unchanged since the most recent exam. IMPRESSION: Small to moderate left pleural effusion and basilar airspace disease which could be atelectasis or pneumonia. Cardiomegaly without edema. Electronically Signed   By: Inge Rise M.D.   On: 02/05/2019 09:44   [place] Image Guided Fluid Drain  By Catheter  Result Date: 02/06/2019 INDICATION: 73 year old female with a history intra-abdominal fluid collection EXAM: ULTRASOUND-GUIDED DRAINAGE ABDOMINAL FLUID COLLECTION MEDICATIONS: The patient is currently admitted to the hospital and receiving intravenous antibiotics. The antibiotics were administered within an appropriate time frame prior to the initiation of the procedure. ANESTHESIA/SEDATION: Fentanyl 50 mcg IV; Versed 2.0 mg IV Moderate Sedation Time:  20 minutes The patient was continuously monitored during the procedure by the interventional radiology nurse under my direct supervision. COMPLICATIONS: None PROCEDURE: Informed written consent was obtained from the patient after a thorough discussion of the procedural risks, benefits and alternatives. All questions were addressed. Maximal Sterile Barrier Technique was utilized including caps, mask, sterile gowns, sterile gloves, sterile drape, hand hygiene and skin antiseptic. A timeout was performed prior to the initiation of the procedure. Patient positioned supine position on the ultrasound table. Ultrasound images of the abdomen performed with images stored sent to PACs. Patient is prepped and draped in the usual sterile fashion. 1% lidocaine was used for local anesthesia. Ultrasound guidance was used to place a 12 [place] biliary drain 45 cm using modified Seldinger technique. Aspirate was for culture. Catheter was sutured in position and attached to bulb drainage. Patient tolerated the procedure well and remained hemodynamically stable throughout. No complications were encountered and no significant blood loss. IMPRESSION: Status post ultrasound-guided abdominal fluid drainage. Signed, Dulcy Fanny. Earleen Newport DO, RPVI [facility] Rad[facility] Electronically Signed   By: Corrie Mckusick D.O.   On: 02/06/2019 07:46    Assessment/Plan  #1-history of mildly elevated temperature-she is afebrile today continues  to deny fever chills cough dysuria shortness of breath.  Appears to be stable as noted above-we have ordered a CBC with differential results are pending we have received results of the metabolic panel which appear to be stable.  2.  History of atrial fibrillation this is rate controlled currently in the 90s on Cardizem 240 mg a day at this point will monitor  [id number]  Addendum-February 15, 2019-we have received updated CBC white count is within normal range at 8.9- hemoglobin is 9.2 platelets 315

## 2019-02-15 ENCOUNTER — Encounter: Payer: Self-pay | Admitting: Internal Medicine

## 2019-02-15 LAB — CBC AND DIFFERENTIAL
HCT: 28 — AB (ref 36–46)
Hemoglobin: 9.2 — AB (ref 12.0–16.0)
Platelets: 315 (ref 150–399)
WBC: 8.9

## 2019-02-17 ENCOUNTER — Other Ambulatory Visit: Payer: Self-pay | Admitting: Surgery

## 2019-02-17 DIAGNOSIS — T8143XA Infection following a procedure, organ and space surgical site, initial encounter: Secondary | ICD-10-CM

## 2019-02-17 DIAGNOSIS — T8149XA Infection following a procedure, other surgical site, initial encounter: Principal | ICD-10-CM

## 2019-02-18 LAB — BASIC METABOLIC PANEL  EGFR
Calcium: 9.1
Carbon Dioxide, Total: 28
Chloride: 101

## 2019-02-20 ENCOUNTER — Non-Acute Institutional Stay (SKILLED_NURSING_FACILITY): Payer: Medicare HMO | Admitting: Internal Medicine

## 2019-02-20 ENCOUNTER — Encounter: Payer: Self-pay | Admitting: Internal Medicine

## 2019-02-20 DIAGNOSIS — T8149XA Infection following a procedure, other surgical site, initial encounter: Secondary | ICD-10-CM

## 2019-02-20 DIAGNOSIS — I482 Chronic atrial fibrillation, unspecified: Secondary | ICD-10-CM

## 2019-02-20 DIAGNOSIS — T8143XA Infection following a procedure, organ and space surgical site, initial encounter: Secondary | ICD-10-CM

## 2019-02-20 DIAGNOSIS — Z7189 Other specified counseling: Secondary | ICD-10-CM | POA: Diagnosis not present

## 2019-02-20 DIAGNOSIS — C189 Malignant neoplasm of colon, unspecified: Secondary | ICD-10-CM | POA: Diagnosis not present

## 2019-02-20 DIAGNOSIS — R509 Fever, unspecified: Secondary | ICD-10-CM

## 2019-02-20 DIAGNOSIS — C787 Secondary malignant neoplasm of liver and intrahepatic bile duct: Secondary | ICD-10-CM | POA: Diagnosis not present

## 2019-02-20 NOTE — Assessment & Plan Note (Addendum)
CODE STATUS discussed; avoidance of CPR recommended.  Follow-up with Dr. Annamaria Boots as soon as Cassandra Allen epidemic resolves.

## 2019-02-20 NOTE — Assessment & Plan Note (Addendum)
Initiate Augmentin if fever persists

## 2019-02-20 NOTE — Progress Notes (Signed)
NURSING HOME LOCATION:  [facility] ROOM NUMBER:  [id number]  CODE STATUS: Full code  PCP: Verlee Rossetti, MD  This is a nursing facility follow up for specific acute issue of fever.  Interim medical record and care since last [place] visit was updated with review of diagnostic studies and change in clinical status since last visit were documented.  HPI:The patient was readmitted following placement of a drain for intra-abdominal abscess 3/20 following resection of the colon cancer 2/14. Culture grew Enterococcus faecalis for which Ampicillin was Rxed. Since readmission to the SNF she has had low-grade fevers approximately 99.  Today she had a temperature up to 100.3.  She does describe some frontal headache without signs or symptoms of rhinosinusitis.It has improved with Tylenol.  She has no other definite localizing signs or symptoms.  She has had some epigastric discomfort.  The drainage has remained a thin milky consistency without frank purulence.  She has completed the antibiotics ordered at discharge. She has a follow-up scan scheduled 4/22 to assess the abscess as well as the status of cancer.  Serial imaging has suggested liver mets and a renal mass as well.  She believes that it has not been determined whether this represents metastatic disease although that is the radiographic interpretation.  She avoids the term "cancer" stating that she was admitted because her abdomen was distended.  Because of the original surgery and subsequent abscess; chemotherapy has not been able to be initiated.  Review of systems: She describes a throbbing pain involving both orbits and extending across the forehead.  Scribes it as constant.  It is not associated with any change in sensation of sight, taste, or smell.  She denies any nasal purulence or extrinsic symptoms. She describes epigastric discomfort with some nausea. She continues to be unable to weight-bear on the left lower extremity.   This was the reason for which she was placed in the SNF for PT/OT.  Constitutional: No significant weight change  Eyes: No redness, discharge, pain, vision change ENT/mouth: No nasal congestion,  purulent discharge, earache, change in hearing, sore throat  Cardiovascular: No chest pain, palpitations, paroxysmal nocturnal dyspnea, claudication, edema  Respiratory: No cough, sputum production, hemoptysis, DOE, significant snoring, apnea   Gastrointestinal: No heartburn, dysphagia, vomiting, rectal bleeding, melena, change in bowels Genitourinary: No dysuria, hematuria, pyuria, incontinence, nocturia Musculoskeletal: No joint stiffness, joint swelling Dermatologic: No rash, pruritus, change in appearance of skin Neurologic: No dizziness,  syncope, seizures, numbness, tingling Psychiatric: No significant insomnia, anorexia Endocrine: No change in hair/skin/nails, excessive thirst, excessive hunger, excessive urination  Hematologic/lymphatic: No significant bruising, lymphadenopathy, abnormal bleeding Allergy/immunology: No itchy/watery eyes, significant sneezing, urticaria, angioedema  Physical exam:  Pertinent or positive findings: She appears chronically ill and is overweight.  There is intermittent slight exotropia of the right eye.  Lacrimal glands are prominent.  Extraocular motion is intact as is vision to confrontation.  Anterior maxillary teeth are missing.  She exhibits an irregular slightly tachycardic rhythm.  There is slight asymmetry of the breath sounds with the left slightly louder than the right posteriorly.  Bowel sounds are active.  Bulb connected to the drain as noted has some thin milky material in it.  She is generally markedly weak.  Pedal pulses were palpable.  She has valgus deformity of the lower extremities.  General appearance:  no acute distress, increased work of breathing is present.   Lymphatic: No lymphadenopathy about the head, neck, axilla. Eyes: No conjunctival  inflammation or  lid edema is present. There is no scleral icterus. Ears:  External ear exam shows no significant lesions or deformities.   Nose:  External nasal examination shows no deformity or inflammation. Nasal mucosa are pink and moist without lesions, exudates Oral exam:  Lips and gums are healthy appearing. There is no oropharyngeal erythema or exudate. Neck:  No thyromegaly, masses, tenderness noted.    Heart:  No gallop, murmur, click, rub .  Lungs: without wheezes, rhonchi, rales, rubs. Abdomen: Abdomen is soft and nontender with no organomegaly, hernias, masses. GU: Deferred  Extremities:  No cyanosis, clubbing, edema  Neurologic exam : Balance, Rhomberg, finger to nose testing could not be completed due to clinical state Skin: Warm & dry w/o tenting. No significant lesions or rash.  See summary under each active problem in the Problem List with associated updated therapeutic plan

## 2019-02-20 NOTE — Assessment & Plan Note (Addendum)
[facility] DON and I had long discussion about CODE STATUS with her.  Avoidance of CPR recommended because of poor prognostic outcome ( approximately 11% chance of even surviving CPR) in the setting of untreated metastatic malignancy and high probability of senile osteoporosis which most likely would result in sternal & multiple rib fractures

## 2019-02-22 ENCOUNTER — Encounter: Payer: Self-pay | Admitting: Internal Medicine

## 2019-02-22 NOTE — Assessment & Plan Note (Addendum)
Eliquis prophylaxis Rate controlled

## 2019-02-22 NOTE — Patient Instructions (Addendum)
See assessment and plan under each diagnosis in the problem list and acutely for this visit Total time 41 minutes; greater than 50% of the visit spent counseling patient, discussing code status and coordinating care for problems addressed at this encounter. [facility] DON & I offered to discuss issues further after she had time to consider her options.

## 2019-02-24 ENCOUNTER — Other Ambulatory Visit: Payer: Self-pay

## 2019-02-24 MED ORDER — TRAMADOL HCL 50 MG PO TABS: ORAL_TABLET | ORAL | 0 refills | Status: DC

## 2019-02-25 ENCOUNTER — Encounter: Payer: Self-pay | Admitting: Internal Medicine

## 2019-02-25 ENCOUNTER — Non-Acute Institutional Stay (SKILLED_NURSING_FACILITY): Payer: Medicare HMO | Admitting: Internal Medicine

## 2019-02-25 DIAGNOSIS — R509 Fever, unspecified: Secondary | ICD-10-CM | POA: Insufficient documentation

## 2019-02-25 DIAGNOSIS — I482 Chronic atrial fibrillation, unspecified: Secondary | ICD-10-CM | POA: Diagnosis not present

## 2019-02-25 DIAGNOSIS — F339 Major depressive disorder, recurrent, unspecified: Secondary | ICD-10-CM

## 2019-02-25 LAB — BASIC METABOLIC PANEL  EGFR
Calcium: 8.7
Carbon Dioxide, Total: 23
Chloride: 101

## 2019-02-25 NOTE — Patient Instructions (Signed)
See assessment and plan under each diagnosis in the problem list and acutely for this visit 

## 2019-02-25 NOTE — Assessment & Plan Note (Addendum)
As she will not be on tramadol, I have no concern with the increase in sertraline dosage and potential for serotonin syndrome.

## 2019-02-25 NOTE — Assessment & Plan Note (Addendum)
Rate slightly tachycardiac but adequately controlled: Continue prophylactic anticoagulation

## 2019-02-25 NOTE — Progress Notes (Signed)
NURSING HOME LOCATION:  [facility] ROOM NUMBER: [id number]   CODE STATUS: Full Code   PCP:  [name] MD  This is a nursing facility follow up for specific issue of fever.  Interim medical record and care since last [place] visit was updated with review of diagnostic studies and change in clinical status since last visit were documented.  HPI: Since the patient has been here she has exhibited low-grade fevers.  The highest was recorded at her last visit.  Typically the temperatures have run in the range of 99.  She has tended to have a slight tachycardia as well.   Glucoses have ranged from 124 up to 293.  She states that his she was seen by the Psychiatry NP 4/22 as a virtual visit for depression.  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  Also she states that she is "still in love with ( her) husband" who left her and who has since remarried a total of 4 times.  Sertraline was increased to 125 mg daily and trazodone was prescribed for sleep.  She had been on melatonin, but she states that she had been sleeping for only 2-hour intervals chronically.   She states that her back pain increases when she sits up for extended periods of time.  She states the tramadol was of no benefit for her pain and Tylenol is more effective.   She also noticed when up more there is increased drainage into the bulb attached to the catheter placed in the abscess intra-abdominally.  Review of systems:  Constitutional: No significant weight change, fatigue  Eyes: No redness, discharge, pain, vision change ENT/mouth: No nasal congestion,  purulent discharge, earache, change in hearing, sore throat  Cardiovascular: No chest pain, palpitations, paroxysmal nocturnal dyspnea, claudication, edema  Respiratory: No cough, sputum production, hemoptysis   Gastrointestinal: No heartburn, dysphagia, abdominal pain, nausea /vomiting, rectal bleeding,  melena, change in bowels Genitourinary: No dysuria, hematuria, pyuria, incontinence, nocturia Dermatologic: No rash, pruritus, change in appearance of skin Neurologic: No dizziness, headache, syncope, seizures, numbness, tingling Psychiatric: No significant  anorexia Endocrine: No change in hair/skin/nails, excessive thirst, excessive hunger, excessive urination  Hematologic/lymphatic: No significant bruising, lymphadenopathy, abnormal bleeding Allergy/immunology: No itchy/watery eyes, significant sneezing, urticaria, angioedema  Physical exam:  Pertinent or positive findings: She appears more alert and more comfortable today compared to prior visits.  Lacrimal glands are prominent.  She is missing multiple maxillary & mandibular teeth.  Cardiac rhythm is slightly rapid , irregular and seems to have somewhat of a respiratory variation.  She has minor rales in the right lower lobe which tend to clear with repeated inspirations.  Breath sounds are decreased as are bowel sounds.  Pedal pulses are decreased.  She has trace edema at the sock line.  She has markedly elongated fingernails.  General appearance: Adequately nourished; no acute distress, increased work of breathing is present.   Lymphatic: No lymphadenopathy about the head, neck, axilla. Eyes: No conjunctival inflammation or lid edema is present. There is no scleral icterus. Ears:  External ear exam shows no significant lesions or deformities.   Nose:  External nasal examination shows no deformity or inflammation. Nasal mucosa are pink and moist without lesions, exudates Oral exam:  Lips and gums are healthy appearing. There is no oropharyngeal erythema or exudate. Neck:  No thyromegaly, masses, tenderness noted.    Heart:  No gallop, murmur, click, rub .  Lungs: without wheezes, rhonchi,  rubs. Abdomen: Bowel sounds are normal. Abdomen is soft and nontender with no organomegaly, hernias, masses. GU: Deferred  Extremities:  No cyanosis,  clubbing  Neurologic exam : Balance, Rhomberg, finger to nose testing could not be completed due to clinical state Skin: Warm & dry w/o tenting. No significant lesions or rash.  See summary under each active problem in the Problem List with associated updated therapeutic plan

## 2019-02-25 NOTE — Assessment & Plan Note (Signed)
She states that the drain is continuing to produce at least 10 cc of milky material day.  There tends to be more production when she is upright for longer periods of time.  Augmentin will be initiated empirically if she has persistent fever.  Imaging  follow-up of the abscess 4/22.

## 2019-02-27 ENCOUNTER — Encounter: Payer: Self-pay | Admitting: Internal Medicine

## 2019-02-28 ENCOUNTER — Non-Acute Institutional Stay (SKILLED_NURSING_FACILITY): Payer: Medicare HMO | Admitting: Internal Medicine

## 2019-02-28 DIAGNOSIS — G4700 Insomnia, unspecified: Secondary | ICD-10-CM

## 2019-02-28 DIAGNOSIS — I482 Chronic atrial fibrillation, unspecified: Secondary | ICD-10-CM

## 2019-02-28 DIAGNOSIS — E119 Type 2 diabetes mellitus without complications: Secondary | ICD-10-CM

## 2019-02-28 DIAGNOSIS — T8149XA Infection following a procedure, other surgical site, initial encounter: Secondary | ICD-10-CM

## 2019-02-28 DIAGNOSIS — R109 Unspecified abdominal pain: Secondary | ICD-10-CM

## 2019-02-28 DIAGNOSIS — Z794 Long term (current) use of insulin: Secondary | ICD-10-CM

## 2019-02-28 DIAGNOSIS — IMO0001 Reserved for inherently not codable concepts without codable children: Secondary | ICD-10-CM

## 2019-02-28 DIAGNOSIS — T8143XA Infection following a procedure, organ and space surgical site, initial encounter: Secondary | ICD-10-CM

## 2019-02-28 NOTE — Progress Notes (Signed)
This is an acute visit.  Level care skilled.  Facility is [facility].  Chief complaint acute visit secondary to abdominal discomfort.  History of present illness.  Patient is a pleasant 73 year old female who has a complicated medical history. She was hospitalized in late March for generalized weakness abdominal pain imaging did show a significant intra-abdominal abscess-she was treated with Zosyn and general surgery was consulted-draining of the abscess was completed by interventional radiology drain culture grew out enterococcus faecalis  She has completed a course of Augmentin.  Abscess was in light of a recent colectomy and transverse end colostomy on February 14.  She is supposed to have followed by interventional radiology in the drain clinic and apparently will have repeat imaging on April 22.  At times she is run a slightly elevated temperature during her stay here- she was seen by Dr. Linna Darner earlier this week- and recommendation was if fever is persistent to restart Augmentin-.  She is not febrile today temperature is 97.7.  However she does complain of some abdominal discomfort more so when she was sitting upright -- and when she lies down she has pain for a while and then it goes away.  She also reports some nausea at times and Zofran appears to help.  She actually says her appetite is pretty good.   Past Medical History:  Diagnosis Date  . Acute diastolic heart failure ([place]) 12/22/2018  . Arthritis   . Atrial fibrillation, chronic 12/22/2018  . Cancer of left colon ([place]) 10/30/2018  . Cancer of sigmoid colon  12/27/2018  . Diabetes mellitus without complication ([place])   . Hypertension   . Obesity (BMI 30-39.9) 12/27/2018        Past Surgical History:  Procedure Laterality Date  . COLONOSCOPY  10/2018   Dr [name].  Large cancer at splenic flexure,  Bulky sigmoid colon mass, Numerous polyps  . Intra-abdominal abscess drainage  01/31/2019   Abscess  drainage grew Enterococcus faecalis  . IR IMAGING GUIDED PORT INSERTION  11/20/2018  . LAPAROTOMY N/A 12/27/2018   Procedure: LEFT SIGMOID COLECTOMY WITH HARTMANN POUCH AND END COLOSTOMY;  Surgeon: [name], MD;  Location: [facility];  Service: General;  Laterality: N/A;        Allergies  Allergen Reactions  . Metformin And Related Nausea And Vomiting  . Prednisone Nausea And Vomiting          Medication Sig  . acetaminophen (TYLENOL) 500 MG tablet Take 500 mg by mouth every 4 (four) hours as needed. FOR PAIN OR TEMP.  Marland Kitchen apixaban (ELIQUIS) 5 MG TABS tablet TAKE 1 TABLET (5 MG TOTAL) BY MOUTH 2 (TWO) TIMES DAILY.  Marland Kitchen diltiazem (CARDIZEM CD) 240 MG 24 hr capsule Take 1 capsule (240 mg total) by mouth daily.  . feeding supplement, ENSURE ENLIVE, (ENSURE ENLIVE) LIQD Take 1 Bottle by mouth daily.  . ferrous sulfate (KP FERROUS SULFATE) 325 (65 FE) MG tablet Take 325 mg by mouth daily with breakfast.  . furosemide (LASIX) 20 MG tablet Give one by mouth daily as needed for wt gain of 3 lbs in 1 day or 5 lbs in 1 week  . Insulin NPH, Human,, Isophane, (HUMULIN N KWIKPEN) 100 UNIT/ML Kiwkpen INJECT 12 UNITS SUBCUTANEOUSLY BEFORE BREAKFAST AND EVENING MEALS  . Melatonin 3 MG TABS Take 6 mg by mouth at bedtime.   . NON FORMULARY NSA Med Pass 120 ml po daily  . ondansetron (ZOFRAN) 8 MG tablet Take 8 mg by mouth every  8 (eight) hours as needed for nausea.  Marland Kitchen oxybutynin (DITROPAN-XL) 10 MG 24 hr tablet Take 10 mg by mouth daily.  . sertraline (ZOLOFT) 100 MG tablet Take 100 mg by mouth daily.   . simvastatin (ZOCOR) 20 MG tablet Take 20 mg by mouth at bedtime.  .     .  Tylenol 500 mg every 6 hours as needed pain   . [DISCONTINUED] traMADol (ULTRAM) 50 MG tablet Take 1 tablet (50 mg total) by mouth daily as needed. Take one tab a day prn pain--do NOT take within 12 hours of Zoloft administration!! (Patient taking differently: Take 50 mg by mouth daily as needed. Take one tab a day at bedtime  prn pain--do NOT take within 12 hours of Zoloft administration!!)   No facility-administered encounter medications on file as of 02/14/2019.    Review of systems.  In general she is not complaining fever chills.  Skin does not complain of diaphoresis rashes or itching.  Head ears eyes nose mouth and throat does not complain of visual changes or difficulty swallowing.  Respiratory denies increased shortness of breath or cough.  Cardiac denies chest pain or increased edema.  GI complains of somewhat diffuse abdominal discomfort more so on her left side when she sitting up says eventually this is relieved when she lies down.  Also at times has nausea relieved with Zofran.  GU is not complaining of dysuria.  Musculoskeletal at times does complain of somewhat diffuse joint pain back pain apparently the Tylenol does help.  Neurologic does not complain of dizziness headache syncope has baseline weakness.  Psych does have a history of depression she has been followed by psychiatric services and continues on Zoloft she is also on trazodone with a history of insomnia which she continues to complain of at times  l  Physical exam.  Temperature is 97.7 pulse 80 respirations 18 blood pressure 105/66.  In general this is a pleasant elderly female in no distress appears to be lying comfortably in bed.  Her skin is warm and dry surgical site midabdomen is currently covered she also has a colostomy bag in place appears unremarkable she also has a drain in place which has a small amount of drainage at this time  Oropharynx is clear mucous membranes moist.  Chest is clear to auscultation she does not have labored breathing.  She does have somewhat shallow air entry.  Heart is regular irregular rate and rhythm she is not tachycardic she has quite mild lower extremity edema appears baseline.  Abdomen is somewhat obese please see skin section for further details- abdomen is soft there are  positive bowel sounds. She appears to have some moderate tenderness to palpation it is somewhat difficult to localize --she says this is more intermittent and more so when she was sitting up  Musculoskeletal does have lower extremity weakness but is able to move all her extremities at baseline.  Neurologic is grossly intact her speech is clear could not appreciate lateralizing findings.  Psych she is alert and oriented pleasant and appropriate  Labs.  February 15, 2019.  WBC 8.9 hemoglobin 9.2 platelets 315.  February 14, 2019.  Sodium 143 potassium 4.3 BUN 25 creatinine 0.7.  Assessment and plan.  1.  Abdominal discomfort more so when sitting up.  This was discussed with Dr. Linna Darner via phone and will start low-dose Bentyl 10 mg p.o. 3 times daily to see if this may help- also will start a clear liquid diet for 24  hours to see if this will help she will need close monitoring with vital signs pulse ox every shift to monitor for any changes.  Again she does have follow-up it appears on April 22 for imaging.  I did discuss if it gets worse possibly going to the hospital at this point she really would like to avoid this which is certainly understandable considering coronavirus issues.  Again she does not appear to be in any acute discomfort at this point.  2.  History of type 2 diabetes she is on Humulin 12 units twice daily when she goes on the clear liquid diet will put her on a loose sliding scale for the short duration of the clear liquid diet and hold the Humulin- blood sugar earlier today was at 200-again this will be monitored by nursing.  3.-  History of insomnia she is on trazodone 50 mg nightly I did discuss this with Dr. Linna Darner and would be hesitant to change her medication at this time at this point will monitor.  #4- history of atrial fibrillation appears rate controlled- she is on diltiazem 240 mg a day at times she is slightly tachycardic but that is not the case today she is on  Eliquis for anticoagulation  [id number]

## 2019-03-01 ENCOUNTER — Encounter: Payer: Self-pay | Admitting: Internal Medicine

## 2019-03-03 ENCOUNTER — Encounter: Payer: Self-pay | Admitting: Internal Medicine

## 2019-03-03 ENCOUNTER — Non-Acute Institutional Stay (SKILLED_NURSING_FACILITY): Payer: Medicare HMO | Admitting: Internal Medicine

## 2019-03-03 DIAGNOSIS — T8143XA Infection following a procedure, organ and space surgical site, initial encounter: Secondary | ICD-10-CM

## 2019-03-03 DIAGNOSIS — C787 Secondary malignant neoplasm of liver and intrahepatic bile duct: Secondary | ICD-10-CM | POA: Diagnosis not present

## 2019-03-03 DIAGNOSIS — C189 Malignant neoplasm of colon, unspecified: Secondary | ICD-10-CM

## 2019-03-03 DIAGNOSIS — F339 Major depressive disorder, recurrent, unspecified: Secondary | ICD-10-CM

## 2019-03-03 DIAGNOSIS — Z794 Long term (current) use of insulin: Secondary | ICD-10-CM | POA: Diagnosis not present

## 2019-03-03 DIAGNOSIS — I4891 Unspecified atrial fibrillation: Secondary | ICD-10-CM | POA: Diagnosis not present

## 2019-03-03 DIAGNOSIS — E119 Type 2 diabetes mellitus without complications: Secondary | ICD-10-CM | POA: Diagnosis not present

## 2019-03-03 DIAGNOSIS — T8149XA Infection following a procedure, other surgical site, initial encounter: Secondary | ICD-10-CM

## 2019-03-03 DIAGNOSIS — IMO0001 Reserved for inherently not codable concepts without codable children: Secondary | ICD-10-CM

## 2019-03-03 NOTE — Progress Notes (Signed)
Location:    [facility] Room Number: 694/W Place of Service:  SNF [phone]) Provider:  Granville Lewis PA-C  [name], MD  Patient Care Team: [name], MD as PCP - General (Family Medicine) Thompson Grayer, MD as PCP - Cardiology (Cardiology) Thompson Grayer, MD as PCP - Electrophysiology (Cardiology) Ronnette Juniper, MD as Consulting Physician (Gastroenterology) Truitt Merle, MD as Consulting Physician (Medical Oncology) Newt Minion, MD as Consulting Physician (Orthopedic Surgery) Leighton Ruff, MD as Consulting Physician (Colon and Rectal Surgery)  Extended Emergency Contact Information Primary Emergency Contact: [place] Phone: [phone] Relation: Son  Code Status:  Full Code Goals of care: Advanced Directive information Advanced Directives 03/03/2019  Does Patient Have a Medical Advance Directive? Yes  Type of Advance Directive (No Data)  Does patient want to make changes to medical advance directive? No - Patient declined  Would patient like information on creating a medical advance directive? No - Patient declined     Chief Complaint  Patient presents with  . Medical Management of Chronic Issues    Routine viist of medical management  For management of chronic medical conditions including history of intra-abdominal abscess status post: With history of metastatic carcinoma of the colon also atrial fibrillation-type 2 diabetes- diastolic CHF- depression- chronic pain  HPI:  Pt is a 73 y.o. female seen today for medical management of chronic diseases.  As noted above.  She has a complicated medical history--most recently she was hospitalized in late March for weakness abdominal pain-imaging showed a significant intra-abdominal abscess-she was treated with Zosyn and eventually Augmentin which she is completed.--She continues with an abdominal drain  She will have follow-up on Wednesday of  this week with repeat imaging.  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 Marcelle Smiling has received chemotherapy but this appears to be on hold for the time being  She did complain last Friday of some abdominal discomfort especially when sitting up she has been started on low dose Bentyl--Per staff and patient this appears to be helping some.  She continues to complain of times of some abdominal discomfort-she thinks she may have gas  Her other medical issues include type 2 diabetes she has been switched to Novolin N 12 units twice daily recent blood sugars appear to be more in the 100-200 range  Over the weekend she was on a clear liquid diet just had her on sliding scale   In regards to atrial fibrillation she did have a rapid ventricular rate at one point in the hospital-this appears to be rate controlled currently on Cardizem 240 a day-she is also on Eliquis 5 mg twice daily.  She also has a history of chronic pain at one point had been on hydrocodone- then tramadol once a day but she actually said Tylenol worked better than tramadol Tylenol.  She also has a history of anemia she is on iron recent hemoglobins have been in the 8-9's range  Currently she is sitting in bed she appears to be comfortable does have somewhat of a chronically ill appearance.  Vital signs are stable     Past Medical History:  Diagnosis Date  . Acute diastolic heart failure ([place]) 12/22/2018  . Arthritis   . Atrial fibrillation, chronic 12/22/2018  . Cancer of left colon ([place])  10/30/2018  . Cancer of sigmoid colon  12/27/2018  . Diabetes mellitus without complication ([place])   . Hypertension   . Obesity (BMI 30-39.9) 12/27/2018   Past Surgical History:  Procedure Laterality Date  . COLONOSCOPY  10/2018   Dr [name].  Large cancer at splenic flexure,  Bulky  sigmoid colon mass, Numerous polyps  . Intra-abdominal abscess drainage  01/31/2019   Abscess drainage grew Enterococcus faecalis  . IR IMAGING GUIDED PORT INSERTION  11/20/2018  . LAPAROTOMY N/A 12/27/2018   Procedure: LEFT SIGMOID COLECTOMY WITH HARTMANN POUCH AND END COLOSTOMY;  Surgeon: [name], MD;  Location: [facility];  Service: General;  Laterality: N/A;    Allergies  Allergen Reactions  . Metformin And Related Nausea And Vomiting  . Prednisone Nausea And Vomiting    Allergies as of 03/03/2019      Reactions   Metformin And Related Nausea And Vomiting   Prednisone Nausea And Vomiting      Medication List       Accurate as of March 03, 2019  8:44 AM. Always use your most recent med list.        acetaminophen 500 MG tablet Commonly known as:  TYLENOL Take 500 mg by mouth every 4 (four) hours as needed. FOR PAIN OR TEMP.   apixaban 5 MG Tabs tablet Commonly known as:  Eliquis TAKE 1 TABLET (5 MG TOTAL) BY MOUTH 2 (TWO) TIMES DAILY.   Bentyl 10 MG capsule Generic drug:  dicyclomine Take 10 mg by mouth 4 (four) times daily -  before meals and at bedtime.   diltiazem 240 MG 24 hr capsule Commonly known as:  CARDIZEM CD Take 1 capsule (240 mg total) by mouth daily.   feeding supplement (ENSURE ENLIVE) Liqd Take 1 Bottle by mouth daily.   furosemide 20 MG tablet Commonly known as:  LASIX Give one by mouth daily as needed for wt gain of 3 lbs in 1 day or 5 lbs in 1 week   HumuLIN N KwikPen 100 UNIT/ML Kiwkpen Generic drug:  Insulin NPH (Human) (Isophane) INJECT 12 UNITS SUBCUTANEOUSLY BEFORE BREAKFAST AND EVENING MEALS   KP Ferrous Sulfate 325 (65 FE) MG tablet Generic drug:  ferrous sulfate Take 325 mg by mouth daily with breakfast.   NON FORMULARY NSA Med Pass 120 ml po daily   ondansetron 8 MG tablet Commonly known as:  ZOFRAN Take 8 mg by mouth every 8 (eight) hours as needed for nausea.   oxybutynin 10 MG 24 hr tablet Commonly known as:   DITROPAN-XL Take 10 mg by mouth daily.   sertraline 100 MG tablet Commonly known as:  ZOLOFT TAKE 1 TABLET BY MOUTH ONCE DAILY (TAKE WITH 25MG  TO=125MG ) FOR DEPRESSION   sertraline 25 MG tablet Commonly known as:  ZOLOFT TAKE 1 TABLET BY MOUTH ONCE DAILY (TAKE WITH 100MG  TO=125MG ) FOR DEPRESSION   simvastatin 20 MG tablet Commonly known as:  ZOCOR Take 20 mg by mouth at bedtime.   traZODone 50 MG tablet Commonly known as:  DESYREL Take 50 mg by mouth at bedtime.       Review of Systems   In general she is not complaining of fever chills apparently has lost a small amount of weight  Skin does not complain of rashes or itching she does have covering over the abdominal wound is followed by wound care.  Head ears eyes nose mouth and throat is not complain of visual changes or sore throat.  Respiratory is not complaining of  shortness of breath or cough  Cardiac does not complain of chest pain or increased edema  GI does not complain of vomiting at times has had some nausea somewhat generalized abdominal discomfort more so when sitting up-she says this feels a little better today  GU is not complaining of dysuria.  Musculoskeletal occasionally has diffuse joint complaints more so of her back not really complain of that today  Neurologic does not complain of dizziness headache or numbness does have weakness  Psych does have a history of depression and insomnia continues on Zoloft as well as trazodone-she is followed by psychiatric services  Immunization History  Administered Date(s) Administered  . Influenza-Unspecified 07/24/2018   Pertinent  Health Maintenance Due  Topic Date Due  . HEMOGLOBIN A1C  03/13/2019 (Originally 02/07/2019)  . MAMMOGRAM  03/15/2019 (Originally 11/07/1996)  . FOOT EXAM  03/16/2019 (Originally 11/07/1956)  . OPHTHALMOLOGY EXAM  03/16/2019 (Originally 11/07/1956)  . URINE MICROALBUMIN  03/16/2019 (Originally 11/07/1956)  . DEXA SCAN  03/16/2019  (Originally 11/08/2011)  . PNA vac Low Risk Adult (1 of 2 - PCV13) 03/16/2019 (Originally 11/08/2011)  . INFLUENZA VACCINE  06/14/2019  . COLONOSCOPY  10/25/2028   No flowsheet data found. Functional Status Survey:    Vitals:   03/03/19 0825  BP: 124/70  Pulse: 84  Resp: 20  Temp: 98 F (36.7 C)  TempSrc: Oral    Physical Exam   In general this is a pleasant elderly female in no distress.  Skin is warm and dry she does have covering over her abdominal surgical site.  She is somewhat pale which is her baseline.  Eyes visual acuity appears to be intact sclera and conjunctive are clear.  Oropharynx is clear mucous membranes moist.  Chest is clear to auscultation with somewhat shallow air entry there is no labored breathing.  Heart is irregular irregular rate and rhythm with pulse in the 90s she does not really have significant lower extremity edema  Abdomen is soft has some mild tenderness to palpation hard to localize-she does have a colostomy in place-also abdominal wound is covered and she does have an abdominal drain with a moderate amount of drainage  Musculoskeletal is weak but moves all extremities x4 it appears at baseline with lower extremity weakness.  Neurologic as noted above no lateralizing findings her speech is clear.  Psych she is alert and oriented pleasant and appropriate Labs reviewed: Recent Labs    01/01/19 0447 01/02/19 0518 01/03/19 0259  02/03/19 0129 02/05/19 0444 02/06/19 0247 02/07/19 0629 02/08/19 0301 02/09/19 0741 02/11/19 02/14/19  NA 146* 143 139   < > 137 137 137 139 137 138 140 143  K 4.1 4.0 4.5   < > 4.1 4.2 4.8 4.8 4.7 5.1 4.4 4.3  CL 105 101 96*   < > 104 106 100 100 103 99 101 101  CO2 33* 34* 34*   < > 26 28 27 30 26 31 28 23   GLUCOSE 214* 181* 290*   < > 188* 144* 211* 161* 300* 132*  --   --   BUN 31* 25* 27*   < > 17 15 16 21  24* 24* 19 25*  CREATININE 0.68 0.61 0.74   < > 0.91 0.70 0.72 1.09* 0.99 0.75 0.7 0.7   CALCIUM 7.8* 7.4* 7.4*   < > 7.6* 7.8* 8.2* 8.7* 8.3* 8.7* 9.1 8.7  MG 2.0 1.4* 1.7   < > 2.0 1.6* 1.8  --   --   --   --   --  PHOS 2.3* 3.1 3.4  --   --   --   --   --   --   --   --   --    < > = values in this interval not displayed.   Recent Labs    12/30/18 0319 12/31/18 0337  01/20/19 01/23/19 01/31/19 1301  AST 21 20  --  21 12* 11*  ALT 23 20  --  33 20 15  ALKPHOS 87 84  --   --   --  234*  BILITOT 0.9 0.6  --   --   --  0.5  PROT 4.4* 4.7*  --  4.8* 5.3*  5.3* 5.3*  ALBUMIN 1.5* 1.5*   < > 2.2 2.4  2.4 1.6*   < > = values in this interval not displayed.   Recent Labs    01/31/19 1301  02/02/19 0318  02/05/19 0444 02/06/19 0247 02/08/19 0301 02/11/19 02/15/19  WBC 8.6   < > 6.1   < > 9.2 10.2 8.6 8.9 8.9  NEUTROABS 7.0  --  4.4  --  6.9  --   --   --   --   HGB 8.8*   < > 8.0*   < > 8.0* 8.6* 8.4* 9.7* 9.2*  HCT 28.9*   < > 25.9*   < > 28.6* 28.4* 29.3* 31* 28*  MCV 91.5   < > 90.2   < > 93.5 92.2 93.6  --   --   PLT 366   < > 346   < > 355 375 323 343 315   < > = values in this interval not displayed.   Lab Results  Component Value Date   TSH 1.894 01/03/2019   Lab Results  Component Value Date   HGBA1C 5.6 08/09/2018   Lab Results  Component Value Date   CHOL 125 08/09/2018   HDL 30 (L) 08/09/2018   LDLCALC 68 08/09/2018   TRIG 202 (H) 12/31/2018   CHOLHDL 4.2 08/09/2018    Significant Diagnostic Results in last 30 days:  Dg Chest Port 1 View  Result Date: 02/05/2019 CLINICAL DATA:  Onset chest pain this morning. Atrial fibrillation with rapid ventricular response. EXAM: PORTABLE CHEST 1 VIEW COMPARISON:  Single-view of the chest 12/31/2018. PA and lateral chest 07/10/2018. FINDINGS: There is a small to moderate left pleural effusion and basilar airspace disease. No right effusion. The right lung is clear. Cardiomegaly is identified. Aortic atherosclerosis is seen. Right IJ approach Port-A-Cath is unchanged since the most recent exam. IMPRESSION:  Small to moderate left pleural effusion and basilar airspace disease which could be atelectasis or pneumonia. Cardiomegaly without edema. Electronically Signed   By: Inge Rise M.D.   On: 02/05/2019 09:44    Assessment/Plan  #1 history of intra-abdominal abscess status post colostomy- she has completed her antibiotics including Zosyn and Augmentin she will have repeat imaging later this week.  She continues to complain of some abdominal discomfort more so when sitting up-she says this has improved somewhat she has been started on low-dose  bentyl--she has concerns maybe she is having gas as well will give PRN simethicone as well.  2.  History of metastatic carcinoma:- Post surgery-she is followed by Dr. Marcelle Smiling also has been followed by surgery- therapy hold.  She has suspected metastasis to the liver at this point will monitor follow-up as noted above grap  h #3- atrial fibrillation currently appears rate controlled on Cardizem 240 mg a day  occasionally she is slightly tachycardic persistent.  She is on Eliquis 5 mg twice daily for anticoagulation  #4-history of diabetes type 2- blood sugars I been able to assess appear to be more in the 100-200s range she is on Novolin N 12 units twice daily- she was on a clear liquid diet over the weekend secondary some nausea and abdominal pain but she is now back on her diet-sliding scale been discontinued and she is on regular CBG checks as previously ordered.  5.-History of anemia last hemoglobin was 9.2 which appears relatively baseline with recent values-she is on iron- will monitor periodically but thishas been somewhat challenging because of coronavirus concerns and concerns of limiting lab secondary to trying to limit visitors to the facility.  6.  History of diastolic CHF she apparently did get Lasix in the hospital secondary to concerns of increased fluid-this has not really been the issue during her stay here however edema she does have  PRN Lasix for concerning weight gain which has not been the case so far  #7- chronic pain previously had been on hydrocodone or tramadol- now currently on Tylenol will monitor she says the Tylenol is more effective than tramadol.  8.  Depression she has been followed by psychiatric services continues on Zoloft as a day  9.  Insomnia-she is on trazodone 50 mg nightly at times- she will complain of insomnia but we have been hesitant to increase her dose of trazodone is not really complaining of insomnia today--we will continue to monitor.  [id number]

## 2019-03-05 ENCOUNTER — Ambulatory Visit
Admission: RE | Admit: 2019-03-05 | Discharge: 2019-03-05 | Disposition: A | Discharge status: Home / Self Care | Payer: Medicare HMO | Source: Ambulatory Visit | Attending: Surgery | Admitting: Surgery

## 2019-03-05 ENCOUNTER — Other Ambulatory Visit: Payer: Self-pay

## 2019-03-05 ENCOUNTER — Ambulatory Visit
Admission: RE | Admit: 2019-03-05 | Discharge: 2019-03-05 | Disposition: A | Discharge status: Home / Self Care | Payer: Medicare HMO | Source: Ambulatory Visit | Attending: Student | Admitting: Student

## 2019-03-05 DIAGNOSIS — T8149XA Infection following a procedure, other surgical site, initial encounter: Principal | ICD-10-CM

## 2019-03-05 DIAGNOSIS — T8143XA Infection following a procedure, organ and space surgical site, initial encounter: Secondary | ICD-10-CM

## 2019-03-05 DIAGNOSIS — T85638A Leakage of other specified internal prosthetic devices, implants and grafts, initial encounter: Secondary | ICD-10-CM | POA: Diagnosis not present

## 2019-03-05 DIAGNOSIS — K6811 Postprocedural retroperitoneal abscess: Secondary | ICD-10-CM | POA: Diagnosis not present

## 2019-03-05 MED ORDER — IOPAMIDOL (ISOVUE-300) INJECTION 61%
100.0000 mL | Freq: Once | INTRAVENOUS | Status: AC | PRN
  Administered 2019-03-05: 100 mL via INTRAVENOUS

## 2019-03-05 NOTE — Progress Notes (Addendum)
Referring Physician(s): Dr Rockne Coons  Chief Complaint: The patient is seen in follow up today s/p  Intra abd abscess drain placement 3/20 in IR  History of present illness:   Post laparoscopic procedure with sigmoid colectomy for colon cancer 12/27/18. Diffuse hepatic masses. Two chemotherapy treatments.  Post op abscess--- drain placed 01/31/19 Has been living in [facility] since DC 3/29 Flushing regimen only sporadic Maybe 2-3 x/week per RN. OP is 10-30 cc daily Taking Doxycycline 100 mg TID  Pt complains of abd pain and leaking at drain site. She knows of no follow up appt with Dr Hassell Done set in place at this time Conformed with RN.  She denies fever; chills OP is brown and thick fluid Odorous   Scheduled today for CT and drain evaluation   Past Medical History:  Diagnosis Date   Acute diastolic heart failure ([place]) 12/22/2018   Arthritis    Atrial fibrillation, chronic 12/22/2018   Cancer of left colon ([place]) 10/30/2018   Cancer of sigmoid colon  12/27/2018   Diabetes mellitus without complication ([place])    Hypertension    Obesity (BMI 30-39.9) 12/27/2018    Past Surgical History:  Procedure Laterality Date   COLONOSCOPY  10/2018   Dr [name].  Large cancer at splenic flexure,  Bulky sigmoid colon mass, Numerous polyps   Intra-abdominal abscess drainage  01/31/2019   Abscess drainage grew Enterococcus faecalis   IR IMAGING GUIDED PORT INSERTION  11/20/2018   LAPAROTOMY N/A 12/27/2018   Procedure: LEFT SIGMOID COLECTOMY WITH HARTMANN POUCH AND END COLOSTOMY;  Surgeon: [name], MD;  Location: [facility];  Service: General;  Laterality: N/A;    Allergies: Metformin and related and Prednisone  Medications: Prior to Admission medications   Medication Sig Start Date End Date Taking? Authorizing Provider  acetaminophen (TYLENOL) 500 MG tablet Take 500 mg by mouth every 4 (four) hours as needed. FOR PAIN OR TEMP.    [provider]  apixaban  (ELIQUIS) 5 MG TABS tablet TAKE 1 TABLET (5 MG TOTAL) BY MOUTH 2 (TWO) TIMES DAILY. 09/19/18   [name], NP  dicyclomine (BENTYL) 10 MG capsule Take 10 mg by mouth 4 (four) times daily -  before meals and at bedtime.    [provider]  diltiazem (CARDIZEM CD) 240 MG 24 hr capsule Take 1 capsule (240 mg total) by mouth daily. 02/09/19   Regalado, Belkys A, MD  feeding supplement, ENSURE ENLIVE, (ENSURE ENLIVE) LIQD Take 1 Bottle by mouth daily.    [provider]  ferrous sulfate (KP FERROUS SULFATE) 325 (65 FE) MG tablet Take 325 mg by mouth daily with breakfast.    [provider]  furosemide (LASIX) 20 MG tablet Give one by mouth daily as needed for wt gain of 3 lbs in 1 day or 5 lbs in 1 week    [provider]  Insulin NPH, Human,, Isophane, (HUMULIN N KWIKPEN) 100 UNIT/ML Kiwkpen INJECT 12 UNITS SUBCUTANEOUSLY BEFORE BREAKFAST AND EVENING MEALS    [provider]  NON FORMULARY NSA Med Pass 120 ml po daily    [provider]  ondansetron (ZOFRAN) 8 MG tablet Take 8 mg by mouth every 8 (eight) hours as needed for nausea.    [provider]  oxybutynin (DITROPAN-XL) 10 MG 24 hr tablet Take 10 mg by mouth daily. 12/16/18   [provider]  sertraline (ZOLOFT) 100 MG tablet TAKE 1 TABLET BY MOUTH ONCE DAILY (TAKE WITH 25MG  TO=125MG ) FOR  DEPRESSION    [provider]  sertraline (ZOLOFT) 25 MG tablet TAKE 1 TABLET BY MOUTH ONCE DAILY (TAKE WITH 100MG  TO=125MG ) FOR DEPRESSION    [provider]  simvastatin (ZOCOR) 20 MG tablet Take 20 mg by mouth at bedtime.    [provider]  traZODone (DESYREL) 50 MG tablet Take 50 mg by mouth at bedtime.     [provider]     Family History  Problem Relation Age of Onset   Heart attack Mother    Heart attack Father     Social History   Socioeconomic History   Marital status: Divorced    Spouse name: Not on file   Number of children: 2     Years of education: Not on file   Highest education level: Not on file  Occupational History   Not on file  Social Needs   Financial resource strain: Not on file   Food insecurity:    Worry: Not on file    Inability: Not on file   Transportation needs:    Medical: Not on file    Non-medical: Not on file  Tobacco Use   Smoking status: Never Smoker   Smokeless tobacco: Never Used  Substance and Sexual Activity   Alcohol use: No   Drug use: No   Sexual activity: Not on file  Lifestyle   Physical activity:    Days per week: Not on file    Minutes per session: Not on file   Stress: Not on file  Relationships   Social connections:    Talks on phone: Not on file    Gets together: Not on file    Attends religious service: Not on file    Active member of club or organization: Not on file    Attends meetings of clubs or organizations: Not on file    Relationship status: Not on file  Other Topics Concern   Not on file  Social History Narrative   Not on file     Vital Signs: BP 134/78    Pulse 85    Temp (!) 97.1 F (36.2 C)    SpO2 98%   Physical Exam Vitals signs reviewed.  Skin:    General: Skin is warm and dry.     Comments: Site is wet Leaking around site [place]; thick fluid Sutures are out Side holes are visible     Neurological:     Mental Status: She is alert.     Imaging: Ct Abdomen Pelvis W Contrast  Result Date: 03/05/2019 CLINICAL DATA:  Follow-up abscess drain EXAM: CT ABDOMEN AND PELVIS WITH CONTRAST TECHNIQUE: Multidetector CT imaging of the abdomen and pelvis was performed using the standard protocol following bolus administration of intravenous contrast. CONTRAST:  134mL ISOVUE-300 IOPAMIDOL (ISOVUE-300) INJECTION 61% COMPARISON:  01/31/2019 FINDINGS: Lower chest: Moderate left pleural effusion and associated basilar atelectasis are stable. Hepatobiliary: Small hypodensity in the left lobe of the liver on image 15 is stable. The  mass encompassing much of the caudate lobe now measures up to 4.4 cm and previously measured 2.6 cm. Unremarkable gallbladder. Pancreas: Unremarkable Spleen: Small hypodensities in the spleen are stable. Adrenals/Urinary Tract: 3.2 cm left adrenal mass is stable. Unremarkable right adrenal gland. Small hypodensities in the kidneys are stable. Stomach/Bowel: Stomach is decompressed. There is mass effect upon the stomach by a large fat, gas and fluid filled cavity. Small bowel is decompressed. Right lower quadrant colostomy is noted. Status post left hemicolectomy. Vascular/Lymphatic: Aortic  and iliac artery vascular calcifications. No abnormal retroperitoneal adenopathy. Reproductive: Uterus is within normal limits. Other: A drain has been placed in the previously visualized large lower abdominal abscess cavity. Today, the abscess cavity measures 18.1 x 15 x 3.3 cm. Previously, this measured 12.6 x 24.6 x 18.4 cm. The drain is positioned within the left side of the abscess cavity. The abscess does extend centrally towards the midline of the upper pelvis. The upper suspected abscess cavity with mass effect upon the stomach measures 13.8 x 9.4 cm and previously measured 16.6 x 8.4 cm. It is not significantly changed. Small amount of ascites about the liver. Musculoskeletal: No vertebral compression deformity. IMPRESSION: After drainage, the lower abdominal abscess cavity has significantly improved. There is an upper abdominal fluid, gas, and fat containing cavity which is not significantly changed. Additional large abscess cavity is not excluded. Liver lesions and left adrenal mass are noted. The mass in the caudate lobe has worsened. The mass in the left adrenal gland is stable. Moderate left pleural effusion is stable. Small hypodensities in the spleen are stable. Electronically Signed   By: Marybelle Killings M.D.   On: 03/05/2019 10:22    Labs:  CBC: Recent Labs    02/06/19 0247 02/08/19 0301 02/11/19 02/15/19    WBC 10.2 8.6 8.9 8.9  HGB 8.6* 8.4* 9.7* 9.2*  HCT 28.4* 29.3* 31* 28*  PLT 375 323 343 315    COAGS: Recent Labs    12/22/18 0113 12/27/18 0325 12/28/18 0323 12/29/18 2221 12/30/18 1100 01/31/19 1600  INR 1.30 2.14 1.95  --   --  2.6*  APTT 29 34  --  111* 97*  --     BMP: Recent Labs    02/06/19 0247 02/07/19 0629 02/08/19 0301 02/09/19 0741 02/11/19 02/14/19  NA 137 139 137 138 140 143  K 4.8 4.8 4.7 5.1 4.4 4.3  CL 100 100 103 99 101 101  CO2 27 30 26 31 28 23   GLUCOSE 211* 161* 300* 132*  --   --   BUN 16 21 24* 24* 19 25*  CALCIUM 8.2* 8.7* 8.3* 8.7* 9.1 8.7  CREATININE 0.72 1.09* 0.99 0.75 0.7 0.7  GFRNONAA >60 51* 57* >60  --   --   GFRAA >60 59* >60 >60  --   --     LIVER FUNCTION TESTS: Recent Labs    12/28/18 0323 12/30/18 0319 12/31/18 0337 01/02/19 0518 01/20/19 01/23/19 01/31/19 1301  BILITOT 0.4 0.9 0.6  --   --   --  0.5  AST 38 21 20  --  21 12* 11*  ALT 25 23 20   --  33 20 15  ALKPHOS 62 87 84  --   --   --  234*  PROT 4.5* 4.4* 4.7*  --  4.8* 5.3*   5.3* 5.3*  ALBUMIN 1.9* 1.5* 1.5* 1.7* 2.2 2.4   2.4 1.6*    Assessment:  CT looks dramatically better today per Dr Kathlene Cote Collection much improved Lower pelvic abscess noted-- would need new drain placement if after she sees Dr Hassell Done - he deemed necessary. Imaging reviewed with Dr Kathlene Cote Existing drain removed-- was partially dislodged.  Note sent to SNF with pt Must flush 1-2x/daily Record OP Facility MUST call Dr Rockne Coons office to set up follow up for this pt--- ASAP.  40 minutes was spent in this visit  Signed: Lavonia Drafts, PA-C 03/05/2019, 10:29 AM   Please refer to Dr. Kathlene Cote attestation of  this note for management and plan.

## 2019-03-07 ENCOUNTER — Other Ambulatory Visit (HOSPITAL_COMMUNITY): Payer: Self-pay | Admitting: Surgery

## 2019-03-07 ENCOUNTER — Non-Acute Institutional Stay (SKILLED_NURSING_FACILITY): Payer: Medicare HMO | Admitting: Internal Medicine

## 2019-03-07 ENCOUNTER — Encounter: Payer: Self-pay | Admitting: Internal Medicine

## 2019-03-07 DIAGNOSIS — T8149XA Infection following a procedure, other surgical site, initial encounter: Secondary | ICD-10-CM

## 2019-03-07 DIAGNOSIS — G4700 Insomnia, unspecified: Secondary | ICD-10-CM

## 2019-03-07 DIAGNOSIS — T8143XA Infection following a procedure, organ and space surgical site, initial encounter: Secondary | ICD-10-CM

## 2019-03-07 DIAGNOSIS — F331 Major depressive disorder, recurrent, moderate: Secondary | ICD-10-CM | POA: Diagnosis not present

## 2019-03-07 DIAGNOSIS — C186 Malignant neoplasm of descending colon: Secondary | ICD-10-CM

## 2019-03-07 DIAGNOSIS — R443 Hallucinations, unspecified: Secondary | ICD-10-CM | POA: Diagnosis not present

## 2019-03-07 DIAGNOSIS — K56609 Unspecified intestinal obstruction, unspecified as to partial versus complete obstruction: Secondary | ICD-10-CM

## 2019-03-07 NOTE — Progress Notes (Signed)
Location:    [facility] Room Number: 212A Place of Service:  SNF [phone]) Provider:  Janyce Llanos, [place]   Patient Care Team: [name], MD as PCP - General (Family Medicine) Thompson Grayer, MD as PCP - Cardiology (Cardiology) Thompson Grayer, MD as PCP - Electrophysiology (Cardiology) Ronnette Juniper, MD as Consulting Physician (Gastroenterology) Truitt Merle, MD as Consulting Physician (Medical Oncology) Newt Minion, MD as Consulting Physician (Orthopedic Surgery) Leighton Ruff, MD as Consulting Physician (Colon and Rectal Surgery)  Extended Emergency Contact Information Primary Emergency Contact: [place] Phone: [phone] Relation: Son  Code Status:  CPR Goals of care: Advanced Directive information Advanced Directives 03/03/2019  Does Patient Have a Medical Advance Directive? Yes  Type of Advance Directive (No Data)  Does patient want to make changes to medical advance directive? No - Patient declined  Would patient like information on creating a medical advance directive? No - Patient declined     Chief Complaint  Patient presents with  . Acute Visit    fu abdominal discomfort    HPI:  Pt is a 73 y.o. female seen today for an acute visit for follow-up of abdominal discomfort. 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  She did have a drain placed which was recently removed.  She also had imaging done earlier this week which showed improvement of her abdominal abscess it did show a large fluid gas-filled cavity upper abdomen also some increase hypodensity in the caudate lobe of the liver.  She did see her surgeon Dr. Hassell Done yesterday and apparently there are plans to go back to  interventional radiology reinstitute drainage  She continues to complain of some abdominal discomfort she says this is intermittent-we did start her on low-dose Bentyl as well as simethicone- she says at times this gives intermittent relief but not persistent.  She does have Tylenol for pain but says this is not helping that much.  She had been on tramadol once a day previously but said that it helped-she is also been on hydrocodone before but we have been trying to minimize her narcotics   reports occasional nausea she does have Zofran as needed says her appetite is spotty but she takes fluids well   Her other medical issues include type 2 diabetes she is on Novolin N 12 units twice daily- she also has a history of atrial fibrillation continues on diltiazem and Eliquis-.  In regards to anemia she is on iron her last hemoglobin was 9.2  Past Medical History:  Diagnosis Date  . Acute diastolic heart failure ([place]) 12/22/2018  . Arthritis   . Atrial fibrillation, chronic 12/22/2018  . Cancer of left colon ([place]) 10/30/2018  . Cancer of sigmoid colon  12/27/2018  . Diabetes mellitus without complication ([place])   . Hypertension   . Obesity (BMI 30-39.9) 12/27/2018   Past Surgical History:  Procedure Laterality Date  . COLONOSCOPY  10/2018   Dr [name].  Large cancer at splenic flexure,  Bulky sigmoid colon mass, Numerous polyps  . Intra-abdominal abscess drainage  01/31/2019   Abscess drainage grew Enterococcus faecalis  . IR IMAGING GUIDED PORT INSERTION  11/20/2018  . LAPAROTOMY N/A 12/27/2018   Procedure: LEFT SIGMOID COLECTOMY WITH [facility] AND END COLOSTOMY;  Surgeon: [name],  MD;  Location: [facility] ORS;  Service: General;  Laterality: N/A;    Allergies  Allergen Reactions  . Metformin And Related Nausea And Vomiting  . Prednisone Nausea And Vomiting    Outpatient Encounter Medications as of 03/07/2019  Medication Sig  . acetaminophen (TYLENOL) 500 MG tablet Take 500 mg by  mouth every 4 (four) hours as needed. FOR PAIN OR TEMP.  Marland Kitchen apixaban (ELIQUIS) 5 MG TABS tablet TAKE 1 TABLET (5 MG TOTAL) BY MOUTH 2 (TWO) TIMES DAILY.  . bisacodyl (DULCOLAX) 10 MG suppository Place 10 mg rectally as needed for moderate constipation.  . dicyclomine (BENTYL) 10 MG capsule Take 10 mg by mouth. TAKE 1 CAPSULE BY MOUTH THREE TIMES A DAY (DO NOT CRUSH)  . diltiazem (CARDIZEM CD) 240 MG 24 hr capsule Take 1 capsule (240 mg total) by mouth daily.  . feeding supplement, ENSURE ENLIVE, (ENSURE ENLIVE) LIQD Take 1 Bottle by mouth daily.  . ferrous sulfate (KP FERROUS SULFATE) 325 (65 FE) MG tablet Take 325 mg by mouth daily with breakfast.  . furosemide (LASIX) 20 MG tablet Give one by mouth daily as needed for wt gain of 3 lbs in 1 day or 5 lbs in 1 week  . Insulin NPH, Human,, Isophane, (HUMULIN N KWIKPEN) 100 UNIT/ML Kiwkpen INJECT 12 UNITS SUBCUTANEOUSLY BEFORE BREAKFAST AND EVENING MEALS  . magnesium hydroxide (MILK OF MAGNESIA) 400 MG/5ML suspension Take 30 mLs by mouth daily as needed for mild constipation.  . NON FORMULARY NSA Med Pass 120 ml po daily  . ondansetron (ZOFRAN) 8 MG tablet Take 8 mg by mouth every 8 (eight) hours as needed for nausea.  Marland Kitchen oxybutynin (DITROPAN-XL) 10 MG 24 hr tablet Take 10 mg by mouth daily.  . sertraline (ZOLOFT) 100 MG tablet TAKE 1 TABLET BY MOUTH ONCE DAILY (TAKE WITH 25MG  TO=125MG ) FOR DEPRESSION  . sertraline (ZOLOFT) 25 MG tablet TAKE 1 TABLET BY MOUTH ONCE DAILY (TAKE WITH 100MG  TO=125MG ) FOR DEPRESSION  . simethicone (INFANTS GAS RELIEF) 40 MG/0.6ML drops Take 40 mg by mouth 4 (four) times daily as needed for flatulence.  . simvastatin (ZOCOR) 20 MG tablet Take 20 mg by mouth at bedtime.  . traZODone (DESYREL) 50 MG tablet Take 50 mg by mouth at bedtime.   . [DISCONTINUED] NON FORMULARY NSA Med Pass 120 ml po daily   No facility-administered encounter medications on file as of 03/07/2019.     Review of Systems   General she is not  complaining of fever chills does complain of some baseline weakness  Skin does not complain of rashes itching or diaphoresis  Head ears eyes nose mouth and throat is not complain of visual changes or sore throa  t respiratory does not complain of shortness of breath or cough  Cardiac is not complaining of chest pain or edema  GI continues to complain of some intermittent generalized abdominal pain she does receive Zofran.  GU does not complain of dysuria.  Musculoskeletal does complain of weakness and at times has somewhat diffuse joint complaints more so over her back  Is not really complaining of joint pain today   Neurologic positive for weakness she does not complain of dizziness headache or syncope  Psych does have a history of depression services Zoloft also receives trazodone at night for insomnia  Immunization History  Administered Date(s) Administered  . Influenza-Unspecified 07/24/2018   Pertinent  Health Maintenance Due  Topic Date Due  . HEMOGLOBIN A1C  03/13/2019 (Originally 02/07/2019)  . MAMMOGRAM  03/15/2019 (Originally 11/07/1996)  . FOOT EXAM  03/16/2019 (Originally 11/07/1956)  . OPHTHALMOLOGY EXAM  03/16/2019 (Originally 11/07/1956)  . URINE MICROALBUMIN  03/16/2019 (Originally 11/07/1956)  . DEXA SCAN  03/16/2019 (Originally 11/08/2011)  . PNA vac Low Risk Adult (1 of 2 - PCV13) 03/16/2019 (Originally 11/08/2011)  . INFLUENZA VACCINE  06/14/2019  . COLONOSCOPY  10/25/2028   No flowsheet data found. Functional Status Survey:    Vitals:   03/07/19 1041  BP: 130/74  Pulse: 84  Resp: 18  Temp: 97.9 F (36.6 C)  Weight: 163 lb 1.6 oz (74 kg)  Height: 5\' 2"  (1.575 m)  Pulse on exam was 96  Update blood pressure 135/59-temp 97.3 respirations 18 O2 saturation 97% on room air Body mass index is 29.83 kg/m. Physical Exam In general this is a well-nourished elderly female in no distress sitting comfortably in a wheelchair  Her skin is warm and dry  surgical site midabdomen well-healed crusted scar do not see signs of infection  Oropharynx is clear mucous membranes moist   eyes visual acuity appears to be intact sclera and conjunctive are clear  Chest is clear to auscultation with somewhat shallow air entry there is no labored breathing.  Heart is irregular irregular rate and rhythm pulse continues to be in the 90s edema is fairly scant in the lower extremities   Abdomen is somewhat protuberant it is soft somewhat generalized tenderness to palpation colostomy bag has a large amount of stool in it there is covering over her former drainage site.  There is appears to be a healing abdominal surgical scar with crusting.  Bowel sounds are positive  Musculoskeletal appears able to move her extremities at baseline with continued lower extremity weakness  Neurologic cannot appreciate lateralizing findings her speech is clear  Psych she is alert and oriented pleasant and appropriate  Musculoskeletal appears able to move all extremities x4   Labs reviewed: Recent Labs    01/01/19 0447 01/02/19 0518 01/03/19 0259  02/03/19 0129 02/05/19 0444 02/06/19 0247 02/07/19 0629 02/08/19 0301 02/09/19 0741 02/11/19 02/14/19  NA 146* 143 139   < > 137 137 137 139 137 138 140 143  K 4.1 4.0 4.5   < > 4.1 4.2 4.8 4.8 4.7 5.1 4.4 4.3  CL 105 101 96*   < > 104 106 100 100 103 99 101 101  CO2 33* 34* 34*   < > 26 28 27 30 26 31 28 23   GLUCOSE 214* 181* 290*   < > 188* 144* 211* 161* 300* 132*  --   --   BUN 31* 25* 27*   < > 17 15 16 21  24* 24* 19 25*  CREATININE 0.68 0.61 0.74   < > 0.91 0.70 0.72 1.09* 0.99 0.75 0.7 0.7  CALCIUM 7.8* 7.4* 7.4*   < > 7.6* 7.8* 8.2* 8.7* 8.3* 8.7* 9.1 8.7  MG 2.0 1.4* 1.7   < > 2.0 1.6* 1.8  --   --   --   --   --   PHOS 2.3* 3.1 3.4  --   --   --   --   --   --   --   --   --    < > = values in this interval not displayed.   Recent Labs    12/30/18 0319 12/31/18 0337  01/20/19 01/23/19 01/31/19 1301   AST 21 20  --  21 12* 11*  ALT 23 20  --  33  20 15  ALKPHOS 87 84  --   --   --  234*  BILITOT 0.9 0.6  --   --   --  0.5  PROT 4.4* 4.7*  --  4.8* 5.3*  5.3* 5.3*  ALBUMIN 1.5* 1.5*   < > 2.2 2.4  2.4 1.6*   < > = values in this interval not displayed.   Recent Labs    01/31/19 1301  02/02/19 0318  02/05/19 0444 02/06/19 0247 02/08/19 0301 02/11/19 02/15/19  WBC 8.6   < > 6.1   < > 9.2 10.2 8.6 8.9 8.9  NEUTROABS 7.0  --  4.4  --  6.9  --   --   --   --   HGB 8.8*   < > 8.0*   < > 8.0* 8.6* 8.4* 9.7* 9.2*  HCT 28.9*   < > 25.9*   < > 28.6* 28.4* 29.3* 31* 28*  MCV 91.5   < > 90.2   < > 93.5 92.2 93.6  --   --   PLT 366   < > 346   < > 355 375 323 343 315   < > = values in this interval not displayed.   Lab Results  Component Value Date   TSH 1.894 01/03/2019   Lab Results  Component Value Date   HGBA1C 5.6 08/09/2018   Lab Results  Component Value Date   CHOL 125 08/09/2018   HDL 30 (L) 08/09/2018   LDLCALC 68 08/09/2018   TRIG 202 (H) 12/31/2018   CHOLHDL 4.2 08/09/2018    Significant Diagnostic Results in last 30 days:  Ct Abdomen Pelvis W Contrast  Result Date: 03/05/2019 CLINICAL DATA:  Follow-up abscess drain EXAM: CT ABDOMEN AND PELVIS WITH CONTRAST TECHNIQUE: Multidetector CT imaging of the abdomen and pelvis was performed using the standard protocol following bolus administration of intravenous contrast. CONTRAST:  137mL ISOVUE-300 IOPAMIDOL (ISOVUE-300) INJECTION 61% COMPARISON:  01/31/2019 FINDINGS: Lower chest: Moderate left pleural effusion and associated basilar atelectasis are stable. Hepatobiliary: Small hypodensity in the left lobe of the liver on image 15 is stable. The mass encompassing much of the caudate lobe now measures up to 4.4 cm and previously measured 2.6 cm. Unremarkable gallbladder. Pancreas: Unremarkable Spleen: Small hypodensities in the spleen are stable. Adrenals/Urinary Tract: 3.2 cm left adrenal mass is stable. Unremarkable right  adrenal gland. Small hypodensities in the kidneys are stable. Stomach/Bowel: Stomach is decompressed. There is mass effect upon the stomach by a large fat, gas and fluid filled cavity. Small bowel is decompressed. Right lower quadrant colostomy is noted. Status post left hemicolectomy. Vascular/Lymphatic: Aortic and iliac artery vascular calcifications. No abnormal retroperitoneal adenopathy. Reproductive: Uterus is within normal limits. Other: A drain has been placed in the previously visualized large lower abdominal abscess cavity. Today, the abscess cavity measures 18.1 x 15 x 3.3 cm. Previously, this measured 12.6 x 24.6 x 18.4 cm. The drain is positioned within the left side of the abscess cavity. The abscess does extend centrally towards the midline of the upper pelvis. The upper suspected abscess cavity with mass effect upon the stomach measures 13.8 x 9.4 cm and previously measured 16.6 x 8.4 cm. It is not significantly changed. Small amount of ascites about the liver. Musculoskeletal: No vertebral compression deformity. IMPRESSION: After drainage, the lower abdominal abscess cavity has significantly improved. There is an upper abdominal fluid, gas, and fat containing cavity which is not significantly changed. Additional large abscess cavity is not excluded.  Liver lesions and left adrenal mass are noted. The mass in the caudate lobe has worsened. The mass in the left adrenal gland is stable. Moderate left pleural effusion is stable. Small hypodensities in the spleen are stable. Electronically Signed   By: Marybelle Killings M.D.   On: 03/05/2019 10:22    Assessment/Plan  #1 history of intra-abdominal abscess status post colostomy secondary to carcinoma thought to be metastatic to liver.  She has had imaging done as noted above it does show a fluid and gas-filled cavity upper abdominal area- apparently there are plans to go back to interventional radiology for a drain.  She does complain of intermittent  discomfort and nausea- she does have an order for PRN Tylenol she is also been started on low-dose Bentyl ~and simethicone She also has an order for Zofran as needed  She says she only takes a Tylenol it appears a couple times a day I told her she could have it up to every 4 hours as needed she says she will try to ask for this more.  I did speak with Dr. Linna Darner about this and would be hesitant to start any narcotics secondary to constipation concerns with recent abdominal surgery  She also complains of some insomnia she is on trazodone 50 mg a day we have been hesitant to increase this dose secondary to oversedation concerns-she would like to try melatonin again and will start this  Also will order vital signs every shift notify provider of any increased abdominal pain vomiting or nausea   [id number]- greater than 25 minutes spent assessing patient reviewing her chart labs and radiology studies and coordinating a plan of care of note greater than 50% of time spent coordinating a plan of carewith input as noted above   [place], [place]

## 2019-03-10 ENCOUNTER — Emergency Department (HOSPITAL_COMMUNITY): Payer: Medicare HMO

## 2019-03-10 ENCOUNTER — Inpatient Hospital Stay (HOSPITAL_COMMUNITY)
Admission: EM | Admit: 2019-03-10 | Discharge: 2019-03-20 | DRG: 862 | Disposition: A | Discharge status: Home Health | Payer: Medicare HMO | Attending: Internal Medicine | Admitting: Internal Medicine

## 2019-03-10 ENCOUNTER — Other Ambulatory Visit: Payer: Self-pay

## 2019-03-10 ENCOUNTER — Encounter (HOSPITAL_COMMUNITY): Payer: Self-pay

## 2019-03-10 DIAGNOSIS — E1129 Type 2 diabetes mellitus with other diabetic kidney complication: Secondary | ICD-10-CM | POA: Diagnosis present

## 2019-03-10 DIAGNOSIS — M1990 Unspecified osteoarthritis, unspecified site: Secondary | ICD-10-CM | POA: Diagnosis present

## 2019-03-10 DIAGNOSIS — E785 Hyperlipidemia, unspecified: Secondary | ICD-10-CM | POA: Diagnosis present

## 2019-03-10 DIAGNOSIS — I5033 Acute on chronic diastolic (congestive) heart failure: Secondary | ICD-10-CM | POA: Diagnosis present

## 2019-03-10 DIAGNOSIS — D509 Iron deficiency anemia, unspecified: Secondary | ICD-10-CM | POA: Diagnosis not present

## 2019-03-10 DIAGNOSIS — N183 Chronic kidney disease, stage 3 unspecified: Secondary | ICD-10-CM | POA: Diagnosis present

## 2019-03-10 DIAGNOSIS — D630 Anemia in neoplastic disease: Secondary | ICD-10-CM | POA: Diagnosis present

## 2019-03-10 DIAGNOSIS — I248 Other forms of acute ischemic heart disease: Secondary | ICD-10-CM | POA: Diagnosis present

## 2019-03-10 DIAGNOSIS — R52 Pain, unspecified: Secondary | ICD-10-CM | POA: Diagnosis not present

## 2019-03-10 DIAGNOSIS — R7989 Other specified abnormal findings of blood chemistry: Secondary | ICD-10-CM | POA: Diagnosis present

## 2019-03-10 DIAGNOSIS — R0602 Shortness of breath: Secondary | ICD-10-CM | POA: Diagnosis not present

## 2019-03-10 DIAGNOSIS — D6489 Other specified anemias: Secondary | ICD-10-CM | POA: Diagnosis not present

## 2019-03-10 DIAGNOSIS — L0291 Cutaneous abscess, unspecified: Secondary | ICD-10-CM | POA: Diagnosis not present

## 2019-03-10 DIAGNOSIS — C187 Malignant neoplasm of sigmoid colon: Secondary | ICD-10-CM | POA: Diagnosis present

## 2019-03-10 DIAGNOSIS — T8143XA Infection following a procedure, organ and space surgical site, initial encounter: Secondary | ICD-10-CM | POA: Diagnosis present

## 2019-03-10 DIAGNOSIS — N182 Chronic kidney disease, stage 2 (mild): Secondary | ICD-10-CM | POA: Diagnosis present

## 2019-03-10 DIAGNOSIS — Z933 Colostomy status: Secondary | ICD-10-CM

## 2019-03-10 DIAGNOSIS — A419 Sepsis, unspecified organism: Secondary | ICD-10-CM | POA: Diagnosis present

## 2019-03-10 DIAGNOSIS — F329 Major depressive disorder, single episode, unspecified: Secondary | ICD-10-CM | POA: Diagnosis not present

## 2019-03-10 DIAGNOSIS — A4151 Sepsis due to Escherichia coli [E. coli]: Secondary | ICD-10-CM | POA: Diagnosis present

## 2019-03-10 DIAGNOSIS — F339 Major depressive disorder, recurrent, unspecified: Secondary | ICD-10-CM | POA: Diagnosis present

## 2019-03-10 DIAGNOSIS — J90 Pleural effusion, not elsewhere classified: Secondary | ICD-10-CM | POA: Diagnosis not present

## 2019-03-10 DIAGNOSIS — I4821 Permanent atrial fibrillation: Secondary | ICD-10-CM | POA: Diagnosis present

## 2019-03-10 DIAGNOSIS — Z8249 Family history of ischemic heart disease and other diseases of the circulatory system: Secondary | ICD-10-CM

## 2019-03-10 DIAGNOSIS — E669 Obesity, unspecified: Secondary | ICD-10-CM | POA: Diagnosis present

## 2019-03-10 DIAGNOSIS — Z9049 Acquired absence of other specified parts of digestive tract: Secondary | ICD-10-CM

## 2019-03-10 DIAGNOSIS — J9811 Atelectasis: Secondary | ICD-10-CM | POA: Diagnosis present

## 2019-03-10 DIAGNOSIS — I5032 Chronic diastolic (congestive) heart failure: Secondary | ICD-10-CM | POA: Diagnosis present

## 2019-03-10 DIAGNOSIS — E46 Unspecified protein-calorie malnutrition: Secondary | ICD-10-CM | POA: Diagnosis present

## 2019-03-10 DIAGNOSIS — Z6831 Body mass index (BMI) 31.0-31.9, adult: Secondary | ICD-10-CM

## 2019-03-10 DIAGNOSIS — E1121 Type 2 diabetes mellitus with diabetic nephropathy: Secondary | ICD-10-CM | POA: Diagnosis not present

## 2019-03-10 DIAGNOSIS — I10 Essential (primary) hypertension: Secondary | ICD-10-CM | POA: Diagnosis present

## 2019-03-10 DIAGNOSIS — A4189 Other specified sepsis: Secondary | ICD-10-CM | POA: Diagnosis present

## 2019-03-10 DIAGNOSIS — E1122 Type 2 diabetes mellitus with diabetic chronic kidney disease: Secondary | ICD-10-CM | POA: Diagnosis present

## 2019-03-10 DIAGNOSIS — Z1159 Encounter for screening for other viral diseases: Secondary | ICD-10-CM

## 2019-03-10 DIAGNOSIS — E876 Hypokalemia: Secondary | ICD-10-CM | POA: Diagnosis present

## 2019-03-10 DIAGNOSIS — I4891 Unspecified atrial fibrillation: Secondary | ICD-10-CM | POA: Diagnosis present

## 2019-03-10 DIAGNOSIS — Z85038 Personal history of other malignant neoplasm of large intestine: Secondary | ICD-10-CM | POA: Diagnosis not present

## 2019-03-10 DIAGNOSIS — A4181 Sepsis due to Enterococcus: Secondary | ICD-10-CM | POA: Diagnosis present

## 2019-03-10 DIAGNOSIS — Z79899 Other long term (current) drug therapy: Secondary | ICD-10-CM

## 2019-03-10 DIAGNOSIS — I130 Hypertensive heart and chronic kidney disease with heart failure and stage 1 through stage 4 chronic kidney disease, or unspecified chronic kidney disease: Secondary | ICD-10-CM | POA: Diagnosis present

## 2019-03-10 DIAGNOSIS — B964 Proteus (mirabilis) (morganii) as the cause of diseases classified elsewhere: Secondary | ICD-10-CM | POA: Diagnosis not present

## 2019-03-10 DIAGNOSIS — T501X5A Adverse effect of loop [high-ceiling] diuretics, initial encounter: Secondary | ICD-10-CM | POA: Diagnosis not present

## 2019-03-10 DIAGNOSIS — R6521 Severe sepsis with septic shock: Secondary | ICD-10-CM | POA: Diagnosis not present

## 2019-03-10 DIAGNOSIS — K651 Peritoneal abscess: Secondary | ICD-10-CM | POA: Diagnosis present

## 2019-03-10 DIAGNOSIS — E7800 Pure hypercholesterolemia, unspecified: Secondary | ICD-10-CM | POA: Diagnosis not present

## 2019-03-10 DIAGNOSIS — Z87440 Personal history of urinary (tract) infections: Secondary | ICD-10-CM

## 2019-03-10 DIAGNOSIS — E860 Dehydration: Secondary | ICD-10-CM | POA: Diagnosis not present

## 2019-03-10 DIAGNOSIS — Z7901 Long term (current) use of anticoagulants: Secondary | ICD-10-CM

## 2019-03-10 DIAGNOSIS — R509 Fever, unspecified: Secondary | ICD-10-CM | POA: Diagnosis not present

## 2019-03-10 DIAGNOSIS — C185 Malignant neoplasm of splenic flexure: Secondary | ICD-10-CM | POA: Diagnosis present

## 2019-03-10 DIAGNOSIS — R945 Abnormal results of liver function studies: Secondary | ICD-10-CM | POA: Diagnosis not present

## 2019-03-10 DIAGNOSIS — R778 Other specified abnormalities of plasma proteins: Secondary | ICD-10-CM | POA: Diagnosis present

## 2019-03-10 DIAGNOSIS — R609 Edema, unspecified: Secondary | ICD-10-CM | POA: Diagnosis not present

## 2019-03-10 DIAGNOSIS — C787 Secondary malignant neoplasm of liver and intrahepatic bile duct: Secondary | ICD-10-CM | POA: Diagnosis present

## 2019-03-10 DIAGNOSIS — C189 Malignant neoplasm of colon, unspecified: Secondary | ICD-10-CM | POA: Diagnosis present

## 2019-03-10 DIAGNOSIS — Z87898 Personal history of other specified conditions: Secondary | ICD-10-CM | POA: Diagnosis not present

## 2019-03-10 DIAGNOSIS — R1084 Generalized abdominal pain: Secondary | ICD-10-CM | POA: Diagnosis not present

## 2019-03-10 DIAGNOSIS — I110 Hypertensive heart disease with heart failure: Secondary | ICD-10-CM | POA: Diagnosis not present

## 2019-03-10 DIAGNOSIS — R6520 Severe sepsis without septic shock: Secondary | ICD-10-CM | POA: Diagnosis present

## 2019-03-10 DIAGNOSIS — I509 Heart failure, unspecified: Secondary | ICD-10-CM | POA: Diagnosis not present

## 2019-03-10 LAB — LACTIC ACID, PLASMA: Lactic Acid, Venous: 2.5 mmol/L (ref 0.5–1.9)

## 2019-03-10 LAB — COMPREHENSIVE METABOLIC PANEL
ALT: 95 U/L — ABNORMAL HIGH (ref 0–44)
AST: 237 U/L — ABNORMAL HIGH (ref 15–41)
Albumin: 2.5 g/dL — ABNORMAL LOW (ref 3.5–5.0)
Alkaline Phosphatase: 1377 U/L — ABNORMAL HIGH (ref 38–126)
Anion gap: 8 (ref 5–15)
BUN: 20 mg/dL (ref 8–23)
CO2: 31 mmol/L (ref 22–32)
Calcium: 8.7 mg/dL — ABNORMAL LOW (ref 8.9–10.3)
Chloride: 102 mmol/L (ref 98–111)
Creatinine, Ser: 1.09 mg/dL — ABNORMAL HIGH (ref 0.44–1.00)
GFR calc Af Amer: 59 mL/min — ABNORMAL LOW (ref >60)
GFR calc non Af Amer: 51 mL/min — ABNORMAL LOW (ref >60)
Glucose, Bld: 174 mg/dL — ABNORMAL HIGH (ref 70–99)
Potassium: 3.1 mmol/L — ABNORMAL LOW (ref 3.5–5.1)
Sodium: 141 mmol/L (ref 135–145)
Total Bilirubin: 2.3 mg/dL — ABNORMAL HIGH (ref 0.3–1.2)
Total Protein: 6 g/dL — ABNORMAL LOW (ref 6.5–8.1)

## 2019-03-10 LAB — CBC WITH DIFFERENTIAL/PLATELET
Abs Immature Granulocytes: 0.05 10*3/uL (ref 0.00–0.07)
Basophils Absolute: 0 10*3/uL (ref 0.0–0.1)
Basophils Relative: 0 %
Eosinophils Absolute: 0.1 10*3/uL (ref 0.0–0.5)
Eosinophils Relative: 0 %
HCT: 38.6 % (ref 36.0–46.0)
Hemoglobin: 11.1 g/dL — ABNORMAL LOW (ref 12.0–15.0)
Immature Granulocytes: 0 %
Lymphocytes Relative: 2 %
Lymphs Abs: 0.2 10*3/uL — ABNORMAL LOW (ref 0.7–4.0)
MCH: 26.4 pg (ref 26.0–34.0)
MCHC: 28.8 g/dL — ABNORMAL LOW (ref 30.0–36.0)
MCV: 91.9 fL (ref 80.0–100.0)
Monocytes Absolute: 0.6 10*3/uL (ref 0.1–1.0)
Monocytes Relative: 5 %
Neutro Abs: 11.8 10*3/uL — ABNORMAL HIGH (ref 1.7–7.7)
Neutrophils Relative %: 93 %
Platelets: 359 10*3/uL (ref 150–400)
RBC: 4.2 MIL/uL (ref 3.87–5.11)
RDW: 17.9 % — ABNORMAL HIGH (ref 11.5–15.5)
WBC: 12.7 10*3/uL — ABNORMAL HIGH (ref 4.0–10.5)
nRBC: 0 % (ref 0.0–0.2)

## 2019-03-10 LAB — BRAIN NATRIURETIC PEPTIDE: B Natriuretic Peptide: 359.3 pg/mL — ABNORMAL HIGH (ref 0.0–100.0)

## 2019-03-10 LAB — POCT I-STAT EG7
Acid-Base Excess: 2 mmol/L (ref 0.0–2.0)
Bicarbonate: 26.9 mmol/L (ref 20.0–28.0)
Calcium, Ion: 1.16 mmol/L (ref 1.15–1.40)
HCT: 36 % (ref 36.0–46.0)
Hemoglobin: 12.2 g/dL (ref 12.0–15.0)
O2 Saturation: 92 %
Potassium: 3.2 mmol/L — ABNORMAL LOW (ref 3.5–5.1)
Sodium: 142 mmol/L (ref 135–145)
TCO2: 28 mmol/L (ref 22–32)
pCO2, Ven: 40.4 mmHg — ABNORMAL LOW (ref 44.0–60.0)
pH, Ven: 7.431 — ABNORMAL HIGH (ref 7.250–7.430)
pO2, Ven: 61 mmHg — ABNORMAL HIGH (ref 32.0–45.0)

## 2019-03-10 LAB — LIPASE, BLOOD: Lipase: 38 U/L (ref 11–51)

## 2019-03-10 LAB — TROPONIN I: Troponin I: 0.08 ng/mL (ref <0.03)

## 2019-03-10 LAB — PROCALCITONIN: Procalcitonin: 4.38 ng/mL

## 2019-03-10 MED ORDER — IOHEXOL 300 MG/ML  SOLN
100.0000 mL | Freq: Once | INTRAMUSCULAR | Status: AC | PRN
  Administered 2019-03-10: 100 mL via INTRAVENOUS

## 2019-03-10 MED ORDER — SODIUM CHLORIDE 0.9 % IV SOLN
2.0000 g | Freq: Two times a day (BID) | INTRAVENOUS | Status: DC
  Administered 2019-03-11 – 2019-03-20 (×19): 2 g via INTRAVENOUS
  Filled 2019-03-10 (×21): qty 2

## 2019-03-10 MED ORDER — SODIUM CHLORIDE 0.9 % IV SOLN
2.0000 g | Freq: Once | INTRAVENOUS | Status: AC
  Administered 2019-03-10: 2 g via INTRAVENOUS
  Filled 2019-03-10: qty 2

## 2019-03-10 MED ORDER — ONDANSETRON HCL 4 MG/2ML IJ SOLN
4.0000 mg | Freq: Once | INTRAMUSCULAR | Status: AC
  Administered 2019-03-10: 4 mg via INTRAVENOUS
  Filled 2019-03-10: qty 2

## 2019-03-10 MED ORDER — ACETAMINOPHEN 500 MG PO TABS
1000.0000 mg | ORAL_TABLET | Freq: Once | ORAL | Status: AC
  Administered 2019-03-11: 1000 mg via ORAL
  Filled 2019-03-10: qty 2

## 2019-03-10 MED ORDER — SODIUM CHLORIDE 0.9% FLUSH: 10.0000 mL | INTRAVENOUS | Status: DC | PRN

## 2019-03-10 MED ORDER — DILTIAZEM HCL-DEXTROSE 100-5 MG/100ML-% IV SOLN (PREMIX)
5.0000 mg/h | INTRAVENOUS | Status: DC
  Administered 2019-03-10: 5 mg/h via INTRAVENOUS
  Filled 2019-03-10: qty 100

## 2019-03-10 MED ORDER — METRONIDAZOLE IN NACL 5-0.79 MG/ML-% IV SOLN
500.0000 mg | Freq: Once | INTRAVENOUS | Status: AC
  Administered 2019-03-11: 500 mg via INTRAVENOUS
  Filled 2019-03-10: qty 100

## 2019-03-10 MED ORDER — LACTATED RINGERS IV BOLUS (SEPSIS)
1000.0000 mL | Freq: Once | INTRAVENOUS | Status: AC
  Administered 2019-03-10: 1000 mL via INTRAVENOUS

## 2019-03-10 MED ORDER — MORPHINE SULFATE (PF) 4 MG/ML IV SOLN
4.0000 mg | Freq: Once | INTRAVENOUS | Status: AC
  Administered 2019-03-10: 4 mg via INTRAVENOUS
  Filled 2019-03-10: qty 1

## 2019-03-10 MED ORDER — DILTIAZEM HCL 25 MG/5ML IV SOLN
20.0000 mg | Freq: Once | INTRAVENOUS | Status: AC
  Administered 2019-03-10: 20 mg via INTRAVENOUS
  Filled 2019-03-10: qty 5

## 2019-03-10 NOTE — ED Notes (Signed)
Per EMS pt complains of left sided abdominal pain for weeks. EMS states pt is scheduled to have abdominal drain replaced this week. Pt has colostomy in place. Pt febrile for EMS at 101.7. pt tachycardic upon arrival. EMS notes hx of afib.

## 2019-03-10 NOTE — ED Provider Notes (Addendum)
[facility] EMERGENCY DEPARTMENT Provider Note   CSN: [id number] Arrival date & time: 03/10/19  2029    History   Chief Complaint Chief Complaint  Patient presents with  . Abdominal Pain    HPI Cassandra Allen is a 73 y.o. female.     HPI Pt is a 73 y.o. female seen today for an acute visit for  abdominal discomfort. Patient has a complicated medical history including a history of an intra-abdominal abscess status post colectomy and transverse colostomy. Currently on Eliquis for history of A. Fib.  She has suspected metastatic carcinoma with suspected metastasis to the liver. She did have a drain placed which was recently removed By interventional radiology.  Patient arrives today febrile, tachycardic with A. fib RVR.Patient believes she is supposed to have another drain placed on Thursday.  Cannot give much more of her history because of her acute pain.  I spoke to the son who stated that the patient currently is full code and desires all scope of treatment.  He is unsure if there are any surgery scheduled and at this time he does not believe any surgery has been offered to her. Past Medical History:  Diagnosis Date  . Acute diastolic heart failure ([place]) 12/22/2018  . Arthritis   . Atrial fibrillation, chronic 12/22/2018  . Cancer of left colon ([place]) 10/30/2018  . Cancer of sigmoid colon  12/27/2018  . Diabetes mellitus without complication ([place])   . Hypertension   . Obesity (BMI 30-39.9) 12/27/2018    Patient Active Problem List   Diagnosis Date Noted  . Fever 02/25/2019  . Atrial fibrillation with RVR ([name])   . Postoperative intra-abdominal abscess 01/31/2019  . Cancer of sigmoid colon  12/27/2018  . History of adenomatous polyp of colon 12/27/2018  . 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 CKD (chronic kidney disease) stage 3, GFR 30-59 ml/min (HCC) 12/27/2018  . Obesity (BMI 30-39.9) 12/27/2018  . Cholelithiasis 12/27/2018   . Choledocholithiasis by MRCP 12/27/2018  . Status post partial colectomy Feb 2020 12/27/2018  . Colonic obstruction due to metastatic colon cancer 12/26/2018  . Anticoagulant long-term use 12/26/2018  . HLD (hyperlipidemia) 12/22/2018  . Acute renal failure superimposed on stage 2 chronic kidney disease ([place]) 12/22/2018  . Atrial fibrillation, chronic 12/22/2018  . Depression, recurrent ([place]) 12/22/2018  . Chest pain 12/22/2018  . Port-A-Cath in place 12/04/2018  . Goals of care, counseling/discussion 11/22/2018  . GI bleed 11/02/2018  . Rectal bleed   . Cancer of splenic flexure of colon 10/30/2018  . Iron deficiency anemia 04/04/2018  . 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 Pain in right ankle and joints of right foot 01/10/2018  . Essential (primary) hypertension 03/25/2015  . Insulin dependent diabetes mellitus ([place]) 03/25/2015    Past Surgical History:  Procedure Laterality Date  . COLONOSCOPY  10/2018   Dr [name].  Large cancer at splenic flexure,  Bulky sigmoid colon mass, Numerous polyps  . Intra-abdominal abscess drainage  01/31/2019   Abscess drainage grew Enterococcus faecalis  . IR IMAGING GUIDED PORT INSERTION  11/20/2018  . LAPAROTOMY N/A 12/27/2018   Procedure: LEFT SIGMOID COLECTOMY WITH HARTMANN POUCH AND END COLOSTOMY;  Surgeon: [name], MD;  Location: [facility] ORS;  Service: General;  Laterality: N/A;     OB History   No obstetric history on file.      Home Medications    Prior to Admission medications  Medication Sig Start Date End Date Taking? Authorizing Provider  acetaminophen (TYLENOL) 500 MG tablet Take 500 mg by mouth every 4 (four) hours as needed. FOR PAIN OR TEMP.    [provider]  apixaban (ELIQUIS) 5 MG TABS tablet TAKE 1 TABLET (5 MG TOTAL) BY MOUTH 2 (TWO) TIMES DAILY. 09/19/18   [name], NP  bisacodyl (DULCOLAX) 10 MG suppository Place 10 mg rectally as needed  for moderate constipation.    [provider]  dicyclomine (BENTYL) 10 MG capsule Take 10 mg by mouth. TAKE 1 CAPSULE BY MOUTH THREE TIMES A DAY (DO NOT CRUSH)    [provider]  diltiazem (CARDIZEM CD) 240 MG 24 hr capsule Take 1 capsule (240 mg total) by mouth daily. 02/09/19   Regalado, Belkys A, MD  feeding supplement, ENSURE ENLIVE, (ENSURE ENLIVE) LIQD Take 1 Bottle by mouth daily.    [provider]  ferrous sulfate (KP FERROUS SULFATE) 325 (65 FE) MG tablet Take 325 mg by mouth daily with breakfast.    [provider]  furosemide (LASIX) 20 MG tablet Give one by mouth daily as needed for wt gain of 3 lbs in 1 day or 5 lbs in 1 week    [provider]  Insulin NPH, Human,, Isophane, (HUMULIN N KWIKPEN) 100 UNIT/ML Kiwkpen INJECT 12 UNITS SUBCUTANEOUSLY BEFORE BREAKFAST AND EVENING MEALS    [provider]  magnesium hydroxide (MILK OF MAGNESIA) 400 MG/5ML suspension Take 30 mLs by mouth daily as needed for mild constipation.    [provider]  NON FORMULARY NSA Med Pass 120 ml po daily    [provider]  ondansetron (ZOFRAN) 8 MG tablet Take 8 mg by mouth every 8 (eight) hours as needed for nausea.    [provider]  oxybutynin (DITROPAN-XL) 10 MG 24 hr tablet Take 10 mg by mouth daily. 12/16/18   [provider]  sertraline (ZOLOFT) 100 MG tablet TAKE 1 TABLET BY MOUTH ONCE DAILY (TAKE WITH 25MG  TO=125MG ) FOR DEPRESSION    [provider]  sertraline (ZOLOFT) 25 MG tablet TAKE 1 TABLET BY MOUTH ONCE DAILY (TAKE WITH 100MG  TO=125MG ) FOR DEPRESSION    [provider]  simethicone (INFANTS GAS RELIEF) 40 MG/0.6ML drops Take 40 mg by mouth 4 (four) times daily as needed for flatulence.    [provider]  simvastatin (ZOCOR) 20 MG tablet Take 20 mg by mouth at bedtime.    [provider]  traZODone (DESYREL) 50 MG tablet Take 50 mg by mouth at bedtime.     [provider]    Family History Family History  Problem Relation Age of Onset  . Heart attack Mother   . Heart attack Father     Social History Social History   Tobacco Use  . Smoking status: Never Smoker  . Smokeless tobacco: Never Used  Substance Use Topics  . Alcohol use: No  . Drug use: No     Allergies   Metformin and related and Prednisone   Review of Systems Review of Systems  Constitutional: Positive for activity change, appetite change and fever.  HENT: Negative for ear pain and sore throat.   Eyes: Negative for pain and visual disturbance.  Respiratory: Negative for cough and shortness of breath.   Cardiovascular: Negative for chest pain and palpitations.  Gastrointestinal: Positive for abdominal pain and nausea. Negative for anal bleeding, constipation and vomiting.  Genitourinary: Negative for dysuria.  Musculoskeletal: Negative for arthralgias  and back pain.  Skin: Negative for color change and rash.  Allergic/Immunologic: Negative for immunocompromised state.  Hematological: Does not bruise/bleed easily.  All other systems reviewed and are negative.    Physical Exam Updated Vital Signs BP (!) 159/84   Pulse 66   Temp (!) 100.7 F (38.2 C) (Oral)   Resp (!) 30   Ht 5\' 2"  (1.575 m)   Wt 72.6 kg   SpO2 98%   BMI 29.26 kg/m   Physical Exam Vitals signs and nursing note reviewed.  Constitutional:      General: She is in acute distress.     Appearance: She is well-developed. She is ill-appearing. She is not diaphoretic.  HENT:     Head: Normocephalic and atraumatic.  Eyes:     Conjunctiva/sclera: Conjunctivae normal.  Neck:     Musculoskeletal: Neck supple.  Cardiovascular:     Rate and Rhythm: Normal rate and regular rhythm.     Heart sounds: No murmur.  Pulmonary:     Effort: Pulmonary effort is normal. No respiratory distress.     Breath sounds: Normal breath sounds.  Abdominal:     General: There is distension.     Palpations:  Abdomen is soft.     Tenderness: There is generalized abdominal tenderness.     Comments: Right mid abdomen colostomy bag in place with normal-appearing stool.  Left lower quadrant incision site with minimal drainage.  Small amount of erythema surrounding the left lower quadrant wound.  Skin:    General: Skin is warm.  Neurological:     Mental Status: She is alert.      ED Treatments / Results  Labs (all labs ordered are listed, but only abnormal results are displayed) Labs Reviewed - No data to display  EKG None  Radiology No results found.  Procedures .Critical Care Performed by: Andee Poles, MD Authorized by: Andee Poles, MD   Critical care provider statement:    Critical care time (minutes):  90   Critical care was necessary to treat or prevent imminent or life-threatening deterioration of the following conditions:  Cardiac failure, circulatory failure, dehydration, shock and sepsis   Critical care was time spent personally by me on the following activities:  Blood draw for specimens, ordering and performing treatments and interventions, ordering and review of laboratory studies, ordering and review of radiographic studies, pulse oximetry, re-evaluation of patient's condition, review of old charts, obtaining history from patient or surrogate, examination of patient, evaluation of patient's response to treatment, discussions with consultants and development of treatment plan with patient or surrogate   (including critical care time)  Medications Ordered in ED Medications - No data to display   Initial Impression / Assessment and Plan / ED Course  I have reviewed the triage vital signs and the nursing notes.  Pertinent labs & imaging results that were available during my care of the patient were reviewed by me and considered in my medical decision making (see chart for details).         73 year old woman presents to the emergency department in acute  distress with atrial fibrillation with rapid ventricular response at rates between 150 and 170 bpm.  Diltiazem 20 mg given for this with improvement of heart rate, so diltiazem infusion begun.  However after approximately 2 hours the patient began to have lowered blood pressure with systolics in the 41P and 379K.  Size of infusion was stopped at that time and a second liter of fluid bolus was  given to the patient.  Lactic acid elevated on initial draw.  Recheck pending.  CT scan shows slight increase in the size of the large left-sided fluid collection.  Full code at this time.  With her known intra-abdominal infection cefepime and metronidazole were started for the patient and blood cultures were drawn.  Multiple metastases still seen on CT scan.  I have spoke to the son who states that the patient is still full code at this time.  Patient is in multiorgan dysfunction secondary to sepsis at this time and needs admission to the hospital.  I have called interventional radiology to consider the patient for possible percutaneous drainage given her sepsis and the above findings.  Sepsis - Repeat Assessment  Performed at:    11PM  Vitals     Blood pressure (!) 106/56, pulse (!) 141, temperature (!) 100.7 F (38.2 C), temperature source Oral, resp. rate (!) 30, height 5\' 2"  (1.575 m), weight 72.6 kg, SpO2 99 %.  Heart:     Irregular rate and rhythm and Tachycardic  Lungs:    CTA  Capillary Refill:   <2 sec  Peripheral Pulse:   Radial pulse palpable and Dorsalis pedis pulse  palpable  Skin:     Pale   Care transferred to oncoming team around midnight for further care.  The recent clinical data is shown below. Vitals:   03/10/19 2300 03/11/19 0000 03/11/19 0010 03/11/19 0015  BP: 106/60 97/62 98/62  (!) 106/56  Pulse: (!) 167 (!) 138 (!) 139 (!) 141  Resp: (!) 22 (!) 32 (!) 32 (!) 30  Temp:      TempSrc:      SpO2: 97% 95% 97% 99%  Weight:      Height:        Final Clinical Impressions(s) /  ED Diagnoses   Final diagnoses:  H/O abdominal abscess  Abscess  SOB (shortness of breath)    ED Discharge Orders    None       Andee Poles, MD 03/13/19 1600    Andee Poles, MD 03/13/19 1601    Carmin Muskrat, MD 03/14/19 [phone]

## 2019-03-10 NOTE — Progress Notes (Signed)
Pharmacy Antibiotic Note  Cassandra Allen is a 73 y.o. female admitted on 03/10/2019 with intra-abdominal infection.  Pharmacy has been consulted for cefepime dosing. Also has Flagyl ordered x 1 dose in the ED. SCr pending on admit.  Plan: Cefepime 2g IV x 1; then F/u SCr on admit for maintenance doses Flagyl 500mg  IV x 1 per EDP Monitor clinical progress, c/s, renal function F/u de-escalation plan/LOT  Height: 5\' 2"  (157.5 cm) Weight: 160 lb (72.6 kg) IBW/kg (Calculated) : 50.1  Temp (24hrs), Avg:100.7 F (38.2 C), Min:100.7 F (38.2 C), Max:100.7 F (38.2 C)  No results for input(s): WBC, CREATININE, LATICACIDVEN, VANCOTROUGH, VANCOPEAK, VANCORANDOM, GENTTROUGH, GENTPEAK, GENTRANDOM, TOBRATROUGH, TOBRAPEAK, TOBRARND, AMIKACINPEAK, AMIKACINTROU, AMIKACIN in the last 168 hours.  CrCl cannot be calculated (Patient's most recent lab result is older than the maximum 21 days allowed.).    Allergies  Allergen Reactions  . Metformin And Related Nausea And Vomiting  . Prednisone Nausea And Vomiting    Elicia Lamp, PharmD, BCPS Please check AMION for all [facility] contact numbers Clinical Pharmacist 03/10/2019 9:13 PM

## 2019-03-11 ENCOUNTER — Inpatient Hospital Stay (HOSPITAL_COMMUNITY): Payer: Medicare HMO

## 2019-03-11 DIAGNOSIS — C189 Malignant neoplasm of colon, unspecified: Secondary | ICD-10-CM | POA: Diagnosis not present

## 2019-03-11 DIAGNOSIS — F329 Major depressive disorder, single episode, unspecified: Secondary | ICD-10-CM | POA: Diagnosis not present

## 2019-03-11 DIAGNOSIS — A4151 Sepsis due to Escherichia coli [E. coli]: Secondary | ICD-10-CM | POA: Diagnosis present

## 2019-03-11 DIAGNOSIS — I248 Other forms of acute ischemic heart disease: Secondary | ICD-10-CM | POA: Diagnosis present

## 2019-03-11 DIAGNOSIS — E1129 Type 2 diabetes mellitus with other diabetic kidney complication: Secondary | ICD-10-CM | POA: Diagnosis present

## 2019-03-11 DIAGNOSIS — F339 Major depressive disorder, recurrent, unspecified: Secondary | ICD-10-CM

## 2019-03-11 DIAGNOSIS — T8143XA Infection following a procedure, organ and space surgical site, initial encounter: Secondary | ICD-10-CM | POA: Diagnosis present

## 2019-03-11 DIAGNOSIS — I110 Hypertensive heart disease with heart failure: Secondary | ICD-10-CM | POA: Diagnosis not present

## 2019-03-11 DIAGNOSIS — I4821 Permanent atrial fibrillation: Secondary | ICD-10-CM | POA: Diagnosis present

## 2019-03-11 DIAGNOSIS — R945 Abnormal results of liver function studies: Secondary | ICD-10-CM

## 2019-03-11 DIAGNOSIS — I509 Heart failure, unspecified: Secondary | ICD-10-CM | POA: Diagnosis not present

## 2019-03-11 DIAGNOSIS — D630 Anemia in neoplastic disease: Secondary | ICD-10-CM | POA: Diagnosis present

## 2019-03-11 DIAGNOSIS — R7989 Other specified abnormal findings of blood chemistry: Secondary | ICD-10-CM

## 2019-03-11 DIAGNOSIS — E785 Hyperlipidemia, unspecified: Secondary | ICD-10-CM | POA: Diagnosis present

## 2019-03-11 DIAGNOSIS — E876 Hypokalemia: Secondary | ICD-10-CM | POA: Diagnosis present

## 2019-03-11 DIAGNOSIS — D509 Iron deficiency anemia, unspecified: Secondary | ICD-10-CM | POA: Diagnosis not present

## 2019-03-11 DIAGNOSIS — A4181 Sepsis due to Enterococcus: Secondary | ICD-10-CM | POA: Diagnosis present

## 2019-03-11 DIAGNOSIS — L0291 Cutaneous abscess, unspecified: Secondary | ICD-10-CM | POA: Diagnosis not present

## 2019-03-11 DIAGNOSIS — J9811 Atelectasis: Secondary | ICD-10-CM | POA: Diagnosis present

## 2019-03-11 DIAGNOSIS — E1122 Type 2 diabetes mellitus with diabetic chronic kidney disease: Secondary | ICD-10-CM | POA: Diagnosis present

## 2019-03-11 DIAGNOSIS — I5032 Chronic diastolic (congestive) heart failure: Secondary | ICD-10-CM | POA: Diagnosis present

## 2019-03-11 DIAGNOSIS — C787 Secondary malignant neoplasm of liver and intrahepatic bile duct: Secondary | ICD-10-CM

## 2019-03-11 DIAGNOSIS — I10 Essential (primary) hypertension: Secondary | ICD-10-CM

## 2019-03-11 DIAGNOSIS — I130 Hypertensive heart and chronic kidney disease with heart failure and stage 1 through stage 4 chronic kidney disease, or unspecified chronic kidney disease: Secondary | ICD-10-CM | POA: Diagnosis present

## 2019-03-11 DIAGNOSIS — B964 Proteus (mirabilis) (morganii) as the cause of diseases classified elsewhere: Secondary | ICD-10-CM | POA: Diagnosis not present

## 2019-03-11 DIAGNOSIS — A419 Sepsis, unspecified organism: Secondary | ICD-10-CM

## 2019-03-11 DIAGNOSIS — C185 Malignant neoplasm of splenic flexure: Secondary | ICD-10-CM | POA: Diagnosis present

## 2019-03-11 DIAGNOSIS — Z7901 Long term (current) use of anticoagulants: Secondary | ICD-10-CM | POA: Diagnosis not present

## 2019-03-11 DIAGNOSIS — R778 Other specified abnormalities of plasma proteins: Secondary | ICD-10-CM | POA: Diagnosis present

## 2019-03-11 DIAGNOSIS — E46 Unspecified protein-calorie malnutrition: Secondary | ICD-10-CM | POA: Diagnosis present

## 2019-03-11 DIAGNOSIS — N182 Chronic kidney disease, stage 2 (mild): Secondary | ICD-10-CM | POA: Diagnosis present

## 2019-03-11 DIAGNOSIS — D6489 Other specified anemias: Secondary | ICD-10-CM | POA: Diagnosis not present

## 2019-03-11 DIAGNOSIS — C187 Malignant neoplasm of sigmoid colon: Secondary | ICD-10-CM | POA: Diagnosis present

## 2019-03-11 DIAGNOSIS — Z9049 Acquired absence of other specified parts of digestive tract: Secondary | ICD-10-CM | POA: Diagnosis not present

## 2019-03-11 DIAGNOSIS — R6520 Severe sepsis without septic shock: Secondary | ICD-10-CM | POA: Diagnosis present

## 2019-03-11 DIAGNOSIS — K651 Peritoneal abscess: Secondary | ICD-10-CM | POA: Diagnosis present

## 2019-03-11 DIAGNOSIS — Z1159 Encounter for screening for other viral diseases: Secondary | ICD-10-CM | POA: Diagnosis not present

## 2019-03-11 DIAGNOSIS — I4891 Unspecified atrial fibrillation: Secondary | ICD-10-CM

## 2019-03-11 DIAGNOSIS — A4189 Other specified sepsis: Secondary | ICD-10-CM | POA: Diagnosis present

## 2019-03-11 DIAGNOSIS — E7800 Pure hypercholesterolemia, unspecified: Secondary | ICD-10-CM | POA: Diagnosis not present

## 2019-03-11 DIAGNOSIS — I5033 Acute on chronic diastolic (congestive) heart failure: Secondary | ICD-10-CM | POA: Diagnosis present

## 2019-03-11 LAB — BLOOD CULTURE ID PANEL (REFLEXED)

## 2019-03-11 LAB — LIPID PANEL
Cholesterol: 91 mg/dL (ref 0–200)
HDL: 23 mg/dL — ABNORMAL LOW (ref >40)
LDL Cholesterol: 52 mg/dL (ref 0–99)
Total CHOL/HDL Ratio: 4 RATIO
Triglycerides: 78 mg/dL (ref <150)
VLDL: 16 mg/dL (ref 0–40)

## 2019-03-11 LAB — CBC
HCT: 30.9 % — ABNORMAL LOW (ref 36.0–46.0)
Hemoglobin: 8.7 g/dL — ABNORMAL LOW (ref 12.0–15.0)
MCH: 26.6 pg (ref 26.0–34.0)
MCHC: 28.2 g/dL — ABNORMAL LOW (ref 30.0–36.0)
MCV: 94.5 fL (ref 80.0–100.0)
Platelets: 299 10*3/uL (ref 150–400)
RBC: 3.27 MIL/uL — ABNORMAL LOW (ref 3.87–5.11)
RDW: 18.3 % — ABNORMAL HIGH (ref 11.5–15.5)
WBC: 15.2 10*3/uL — ABNORMAL HIGH (ref 4.0–10.5)
nRBC: 0 % (ref 0.0–0.2)

## 2019-03-11 LAB — GLUCOSE, CAPILLARY
Glucose-Capillary: 86 mg/dL (ref 70–99)
Glucose-Capillary: 110 mg/dL — ABNORMAL HIGH (ref 70–99)
Glucose-Capillary: 107 mg/dL — ABNORMAL HIGH (ref 70–99)
Glucose-Capillary: 87 mg/dL (ref 70–99)
Glucose-Capillary: 161 mg/dL — ABNORMAL HIGH (ref 70–99)

## 2019-03-11 LAB — BASIC METABOLIC PANEL
Anion gap: 10 (ref 5–15)
BUN: 20 mg/dL (ref 8–23)
CO2: 27 mmol/L (ref 22–32)
Calcium: 8 mg/dL — ABNORMAL LOW (ref 8.9–10.3)
Chloride: 104 mmol/L (ref 98–111)
Creatinine, Ser: 0.83 mg/dL (ref 0.44–1.00)
GFR calc Af Amer: >60 mL/min (ref >60)
GFR calc non Af Amer: >60 mL/min (ref >60)
Glucose, Bld: 114 mg/dL — ABNORMAL HIGH (ref 70–99)
Potassium: 3.7 mmol/L (ref 3.5–5.1)
Sodium: 141 mmol/L (ref 135–145)

## 2019-03-11 LAB — MAGNESIUM: Magnesium: 1.1 mg/dL — ABNORMAL LOW (ref 1.7–2.4)

## 2019-03-11 LAB — HEMOGLOBIN A1C
Hgb A1c MFr Bld: 6.8 % — ABNORMAL HIGH (ref 4.8–5.6)
Mean Plasma Glucose: 148.46 mg/dL

## 2019-03-11 LAB — URINALYSIS, ROUTINE W REFLEX MICROSCOPIC
Bacteria, UA: NONE SEEN
Bilirubin Urine: NEGATIVE
Glucose, UA: NEGATIVE mg/dL
Ketones, ur: NEGATIVE mg/dL
Nitrite: POSITIVE — AB
Protein, ur: 30 mg/dL — AB
Specific Gravity, Urine: 1.032 — ABNORMAL HIGH (ref 1.005–1.030)
pH: 5 (ref 5.0–8.0)

## 2019-03-11 LAB — TROPONIN I
Troponin I: 0.06 ng/mL (ref <0.03)
Troponin I: 0.05 ng/mL (ref <0.03)

## 2019-03-11 LAB — PROTIME-INR
INR: 2.1 — ABNORMAL HIGH (ref 0.8–1.2)
Prothrombin Time: 22.8 seconds — ABNORMAL HIGH (ref 11.4–15.2)

## 2019-03-11 LAB — PHOSPHORUS: Phosphorus: 4 mg/dL (ref 2.5–4.6)

## 2019-03-11 LAB — MRSA PCR SCREENING: MRSA by PCR: NEGATIVE

## 2019-03-11 LAB — APTT: aPTT: 35 seconds (ref 24–36)

## 2019-03-11 LAB — LACTIC ACID, PLASMA: Lactic Acid, Venous: 3.3 mmol/L (ref 0.5–1.9)

## 2019-03-11 MED ORDER — MAGNESIUM SULFATE 2 GM/50ML IV SOLN
2.0000 g | Freq: Once | INTRAVENOUS | Status: AC
  Administered 2019-03-11: 2 g via INTRAVENOUS
  Filled 2019-03-11: qty 50

## 2019-03-11 MED ORDER — FENTANYL CITRATE (PF) 100 MCG/2ML IJ SOLN
INTRAMUSCULAR | Status: AC
  Filled 2019-03-11: qty 2

## 2019-03-11 MED ORDER — MAGNESIUM SULFATE 4 GM/100ML IV SOLN
4.0000 g | Freq: Once | INTRAVENOUS | Status: AC
  Administered 2019-03-11: 4 g via INTRAVENOUS
  Filled 2019-03-11: qty 100

## 2019-03-11 MED ORDER — PHENYLEPHRINE HCL-NACL 10-0.9 MG/250ML-% IV SOLN
0.0000 ug/min | INTRAVENOUS | Status: DC
  Filled 2019-03-11: qty 250

## 2019-03-11 MED ORDER — MIDAZOLAM HCL 2 MG/2ML IJ SOLN
INTRAMUSCULAR | Status: AC
  Filled 2019-03-11: qty 2

## 2019-03-11 MED ORDER — LACTATED RINGERS IV BOLUS
1000.0000 mL | Freq: Once | INTRAVENOUS | Status: AC
  Administered 2019-03-11: 1000 mL via INTRAVENOUS

## 2019-03-11 MED ORDER — METRONIDAZOLE IN NACL 5-0.79 MG/ML-% IV SOLN
500.0000 mg | Freq: Three times a day (TID) | INTRAVENOUS | Status: DC
  Administered 2019-03-11 – 2019-03-18 (×22): 500 mg via INTRAVENOUS
  Filled 2019-03-11 (×23): qty 100

## 2019-03-11 MED ORDER — MIDAZOLAM HCL 2 MG/2ML IJ SOLN
INTRAMUSCULAR | Status: AC | PRN
  Administered 2019-03-11: 0.5 mg via INTRAVENOUS

## 2019-03-11 MED ORDER — SODIUM CHLORIDE 0.9 % IV SOLN
INTRAVENOUS | Status: DC | PRN
  Administered 2019-03-11 (×2): 1000 mL via INTRAVENOUS

## 2019-03-11 MED ORDER — SODIUM CHLORIDE 0.9 % IV SOLN
INTRAVENOUS | Status: DC
  Administered 2019-03-11 – 2019-03-13 (×4): via INTRAVENOUS

## 2019-03-11 MED ORDER — MORPHINE SULFATE (PF) 2 MG/ML IV SOLN
0.5000 mg | INTRAVENOUS | Status: DC | PRN
  Administered 2019-03-11: 1 mg via INTRAVENOUS
  Administered 2019-03-11: 0.5 mg via INTRAVENOUS
  Administered 2019-03-11 – 2019-03-20 (×24): 1 mg via INTRAVENOUS
  Filled 2019-03-11 (×26): qty 1

## 2019-03-11 MED ORDER — POTASSIUM CHLORIDE CRYS ER 20 MEQ PO TBCR: 40.0000 meq | EXTENDED_RELEASE_TABLET | Freq: Once | ORAL | Status: DC

## 2019-03-11 MED ORDER — INSULIN ISOPHANE HUMAN 100 UNIT/ML KWIKPEN: 5.0000 [IU] | PEN_INJECTOR | Freq: Two times a day (BID) | SUBCUTANEOUS | Status: DC

## 2019-03-11 MED ORDER — SODIUM CHLORIDE 0.9% FLUSH
5.0000 mL | Freq: Three times a day (TID) | INTRAVENOUS | Status: DC
  Administered 2019-03-11 – 2019-03-20 (×20): 5 mL

## 2019-03-11 MED ORDER — HEPARIN (PORCINE) 25000 UT/250ML-% IV SOLN
1600.0000 [IU]/h | INTRAVENOUS | Status: DC
  Administered 2019-03-11: 1000 [IU]/h via INTRAVENOUS
  Administered 2019-03-12: 1350 [IU]/h via INTRAVENOUS
  Administered 2019-03-13: 1450 [IU]/h via INTRAVENOUS
  Administered 2019-03-14: 1500 [IU]/h via INTRAVENOUS
  Administered 2019-03-15: 1600 [IU]/h via INTRAVENOUS
  Administered 2019-03-15: 1500 [IU]/h via INTRAVENOUS
  Administered 2019-03-16 – 2019-03-17 (×2): 1600 [IU]/h via INTRAVENOUS
  Filled 2019-03-11 (×9): qty 250

## 2019-03-11 MED ORDER — OXYCODONE HCL 5 MG PO TABS: 5.0000 mg | ORAL_TABLET | Freq: Four times a day (QID) | ORAL | Status: DC | PRN

## 2019-03-11 MED ORDER — POTASSIUM CHLORIDE 10 MEQ/50ML IV SOLN
10.0000 meq | INTRAVENOUS | Status: AC
  Administered 2019-03-11 (×4): 10 meq via INTRAVENOUS
  Filled 2019-03-11 (×4): qty 50

## 2019-03-11 MED ORDER — INSULIN ASPART 100 UNIT/ML ~~LOC~~ SOLN
0.0000 [IU] | Freq: Three times a day (TID) | SUBCUTANEOUS | Status: DC
  Administered 2019-03-12: 1 [IU] via SUBCUTANEOUS
  Administered 2019-03-12: 3 [IU] via SUBCUTANEOUS
  Administered 2019-03-12: 2 [IU] via SUBCUTANEOUS
  Administered 2019-03-13: 3 [IU] via SUBCUTANEOUS
  Administered 2019-03-13: 2 [IU] via SUBCUTANEOUS
  Administered 2019-03-13: 06:00:00 1 [IU] via SUBCUTANEOUS
  Administered 2019-03-14: 2 [IU] via SUBCUTANEOUS
  Administered 2019-03-14: 5 [IU] via SUBCUTANEOUS
  Administered 2019-03-14: 1 [IU] via SUBCUTANEOUS
  Administered 2019-03-15 (×3): 2 [IU] via SUBCUTANEOUS
  Administered 2019-03-16: 18:00:00 3 [IU] via SUBCUTANEOUS
  Administered 2019-03-16: 5 [IU] via SUBCUTANEOUS

## 2019-03-11 MED ORDER — ONDANSETRON HCL 4 MG PO TABS
4.0000 mg | ORAL_TABLET | Freq: Four times a day (QID) | ORAL | Status: DC | PRN
  Administered 2019-03-15 – 2019-03-20 (×5): 4 mg via ORAL
  Filled 2019-03-11 (×5): qty 1

## 2019-03-11 MED ORDER — SODIUM CHLORIDE 0.9 % IV BOLUS
500.0000 mL | Freq: Once | INTRAVENOUS | Status: AC
  Administered 2019-03-11: 500 mL via INTRAVENOUS

## 2019-03-11 MED ORDER — FENTANYL CITRATE (PF) 100 MCG/2ML IJ SOLN
INTRAMUSCULAR | Status: AC | PRN
  Administered 2019-03-11 (×2): 25 ug via INTRAVENOUS

## 2019-03-11 MED ORDER — ONDANSETRON HCL 4 MG/2ML IJ SOLN
4.0000 mg | Freq: Four times a day (QID) | INTRAMUSCULAR | Status: DC | PRN
  Administered 2019-03-17 – 2019-03-18 (×2): 4 mg via INTRAVENOUS
  Filled 2019-03-11 (×3): qty 2

## 2019-03-11 MED ORDER — PANTOPRAZOLE SODIUM 40 MG IV SOLR
40.0000 mg | Freq: Every day | INTRAVENOUS | Status: DC
  Administered 2019-03-12 – 2019-03-19 (×8): 40 mg via INTRAVENOUS
  Filled 2019-03-11 (×10): qty 40

## 2019-03-11 MED ORDER — MORPHINE SULFATE (PF) 2 MG/ML IV SOLN: 1.0000 mg | INTRAVENOUS | Status: DC | PRN

## 2019-03-11 NOTE — ED Notes (Signed)
Cardizem stopped per MD verbal order

## 2019-03-11 NOTE — ED Notes (Signed)
Admitting MD paged due to pt's continued hypotension. 500 ml bolus started. Will continue to monitor

## 2019-03-11 NOTE — Progress Notes (Signed)
ANTICOAGULATION CONSULT NOTE - Initial Consult  Pharmacy Consult for Apxiaban to Heparin Indication: atrial fibrillation  Allergies  Allergen Reactions  . Metformin And Related Nausea And Vomiting  . Prednisone Nausea And Vomiting    Patient Measurements: Height: 5\' 2"  (157.5 cm) Weight: 171 lb 4.8 oz (77.7 kg) IBW/kg (Calculated) : 50.1  Vital Signs: Temp: 98 F (36.7 C) (04/28 1515) Temp Source: Oral (04/28 1515) BP: 114/61 (04/28 1600) Pulse Rate: 95 (04/28 1600)  Labs: Recent Labs    03/10/19 2058 03/10/19 2200 03/11/19 0238 03/11/19 0557  HGB 11.1* 12.2  --  8.7*  HCT 38.6 36.0  --  30.9*  PLT 359  --   --  299  APTT  --   --  35  --   LABPROT  --   --  22.8*  --   INR  --   --  2.1*  --   CREATININE 1.09*  --   --  0.83  TROPONINI 0.08*  --  0.06* 0.05*    Estimated Creatinine Clearance: 59.1 mL/min (by C-G formula based on SCr of 0.83 mg/dL).   Medical History: Past Medical History:  Diagnosis Date  . Acute diastolic heart failure ([place]) 12/22/2018  . Arthritis   . Atrial fibrillation, chronic 12/22/2018  . Cancer of left colon ([place]) 10/30/2018  . Cancer of sigmoid colon  12/27/2018  . Diabetes mellitus without complication ([place])   . Hypertension   . Obesity (BMI 30-39.9) 12/27/2018    Assessment: 73 year old female to begin heparin for Afib post abdominal drain placements. On apixaban prior to admission - > last dose 4/27 PM Heparin to begin at 9 pm  Goal of Therapy:  Heparin level 0.3-0.7 units/ml Monitor platelets by anticoagulation protocol: Yes  PTT = 66 to 102 seconds   Plan:  Heparin at 1000 units / hr starting at 9 pm Daily heparin, PTT (until correlating) Follow CBC  Thank you Anette Guarneri, PharmD [phone] 03/11/2019,4:25 PM

## 2019-03-11 NOTE — H&P (Signed)
NAME:  Cassandra Allen, MRN:  [id number], DOB:  04/03/1946, LOS: 0 ADMISSION DATE:  03/10/2019, CONSULTATION DATE:  4/28 REFERRING MD:  Dr. Blaine Hamper, CHIEF COMPLAINT:  Abdominal Pain   Brief History   73 year old female with colon Ca that is likely metastatic presenting with abdominal pain after recent admission for intraabdominal sepsis with abscesses discharged with drain.  History of present illness   73 year old female with past medical history as below, which is significant for recent diagnosis of adenocarcinoma of the colon.  Multiple sites of likely metastasis have been documented on serial studies of the abdomen and pelvis, however, hepatic biopsy was negative initially.  She has 2 recent admissions.  The first was in February 2020 at which point she underwent colectomy and transverse end colostomy.  She was then admitted in the end of March for abdominal abscess for which interventional radiology placed a percutaneous drain.  Fluid from the drain grew Enterococcus faecalis.  She was discharged on appropriate antibiotics.  Drain was removed on 4/22.  She had CT scan done at that time which demonstrated enlarging abscess she was scheduled for repeat drain placement.  However, on 4/27 she developed severe epigastric pain associated with nausea and vomiting.  For these complaints she presented to [facility] emergency department.  Upon arrival to the emergency department she was found to be tachycardic in atrial fibrillation and also febrile.  CT scan was done and demonstrated an increase in the size of the large fluid collection extending across the left lower abdomen and central pelvis.  She was felt to be septic from this source and was started on empiric antibiotics.  Interventional radiology was planed and tentatively scheduled an urgent drain placement for 4/28.  The patient was initially hypotensive which improved with volume resuscitation.  Lactic acid which was initially somewhat elevated  had mostly cleared.  Her atrial fibrillation with RVR was treated with diltiazem and she subsequently developed hypotension once again.  PCCM was consulted for further recommendations.  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  Significant Hospital Events   4/28 admit  Consults:    Procedures:  Port-a-cath (pre-hosp) >  Significant Diagnostic Tests:  CT abd/pelv 4/28 > Persistent large gas and fluid collection in upper abdomen anterior to the stomach, without significant change. Mild increase in size of large fluid collection extending across the left lower abdomen and central pelvis, following removal of percutaneous drainage catheter. Caudate lobe liver metastasis, without significant interval change. No new or progressive metastatic disease identified. Stable left pleural effusion and left lower lobe atelectasis.  Micro Data:  Blood 4/27 >  Antimicrobials:  Cefepime 4/27 > Flagyl 4/27 >  Interim history/subjective:    Objective   Blood pressure (!) 91/56, pulse 97, temperature 99.1 F (37.3 C), temperature source Oral, resp. rate (!) 22, height 5\' 2"  (1.575 m), weight 72.6 kg, SpO2 97 %.       No intake or output data in the 24 hours ending 03/11/19 0216 Filed Weights   03/10/19 2033  Weight: 72.6 kg    Examination: General: Obese elderly female in NAD HENT: [place]/AT, PERRL, no JVD Lungs: Clear bilateral breath sounds Cardiovascular: Tachy, IRIR. Rates 110-140bpm Abdomen: Distended, tender. Colostomy bag distended with gas.  Extremities: No acute  deformity or ROM limitation Neuro: Alert, oriented, non-focal  Resolved Hospital Problem list     Assessment & Plan:   Severe sepsis vs  septic shock: MAP currently ~65 without pressors. She is s/p 2.5 L IVF. Has been vomiting.  - Bolus 500cc crystalloid now and gauge response - Ensure lactic clearing - At risk of declining without definitive source control, and need for pain control, may need pressors. Will admit to ICU.  Intraabdominal sepsis - ABX as above - Blood Cx pending - IR consulted for source control. Perc drain tentatively 4/28.  Atrial fibrillation with RVR - Hopefully will improve with hydration and BP support - DC diltiazem - Consider amiodarone if does not improve - Holding home eliquis in anticipation of IR procedure.  - Hold home diltiazem  AKI Hypokalemia - Supplement K - Follow BMP, suspect this will improve with volume resuscitation.   DM:   - SSI - Holding scheduled insulin while NPO   Best practice:  Diet: NPO Pain/Anxiety/Delirium protocol (if indicated): PRN morphine VAP protocol (if indicated): n/a DVT prophylaxis: holding anticoagulants. SCDs only GI prophylaxis: PPI Glucose control: SSI Mobility: BR Code Status: Full. Needs further discussion Family Communication: Patient updated.  Disposition: ICU  Labs   CBC: Recent Labs  Lab 03/10/19 2058 03/10/19 2200  WBC 12.7*  --   NEUTROABS 11.8*  --   HGB 11.1* 12.2  HCT 38.6 36.0  MCV 91.9  --   PLT 359  --     Basic Metabolic Panel: Recent Labs  Lab 03/10/19 2058 03/10/19 2200  NA 141 142  K 3.1* 3.2*  CL 102  --   CO2 31  --   GLUCOSE 174*  --   BUN 20  --   CREATININE 1.09*  --   CALCIUM 8.7*  --    GFR: Estimated Creatinine Clearance: 43.5 mL/min (A) (by C-G formula based on SCr of 1.09 mg/dL (H)). Recent Labs  Lab 03/10/19 2058 03/11/19 0049  PROCALCITON 4.38  --   WBC 12.7*  --   LATICACIDVEN 2.5* 3.3*    Liver Function Tests: Recent Labs  Lab 03/10/19 2058  AST 237*  ALT 95*  ALKPHOS 1,377*  BILITOT 2.3*  PROT 6.0*  ALBUMIN 2.5*   Recent Labs  Lab 03/10/19 2058  LIPASE 38   No  results for input(s): AMMONIA in the last 168 hours.  ABG    Component Value Date/Time   HCO3 26.9 03/10/2019 2200   TCO2 28 03/10/2019 2200   O2SAT 92.0 03/10/2019 2200     Coagulation Profile: No results for input(s): INR, PROTIME in the last 168 hours.  Cardiac Enzymes: Recent Labs  Lab 03/10/19 2058  TROPONINI 0.08*    HbA1C: Hgb A1c MFr Bld  Date/Time Value Ref Range Status  08/09/2018 10:03 AM 5.6 4.8 - 5.6 % Final    Comment:    (NOTE) Pre diabetes:          5.7%-6.4% Diabetes:              >6.4% Glycemic control for   <7.0% adults with diabetes   03/26/2015 01:00 AM 7.0 (H) 4.8 - 5.6 % Final    Comment:    (NOTE)         Pre-diabetes: 5.7 - 6.4         Diabetes: >6.4         Glycemic control for adults with diabetes: <7.0     CBG: No results for input(s): GLUCAP in  the last 168 hours.  Review of Systems:     Past Medical History  She,  has a past medical history of Acute diastolic heart failure ([place]) (12/22/2018), Arthritis, Atrial fibrillation, chronic (12/22/2018), Cancer of left colon ([place]) (10/30/2018), Cancer of sigmoid colon  (12/27/2018), Diabetes mellitus without complication ([place]), Hypertension, and Obesity (BMI 30-39.9) (12/27/2018).   Surgical History    Past Surgical History:  Procedure Laterality Date  . COLONOSCOPY  10/2018   Dr [name].  Large cancer at splenic flexure,  Bulky sigmoid colon mass, Numerous polyps  . Intra-abdominal abscess drainage  01/31/2019   Abscess drainage grew Enterococcus faecalis  . IR IMAGING GUIDED PORT INSERTION  11/20/2018  . LAPAROTOMY N/A 12/27/2018   Procedure: LEFT SIGMOID COLECTOMY WITH HARTMANN POUCH AND END COLOSTOMY;  Surgeon: [name], MD;  Location: [facility] ORS;  Service: General;  Laterality: N/A;     Social History   reports that she has never smoked. She has never used smokeless tobacco. She reports that she does not drink alcohol or use drugs.   Family History   Her family history includes Heart  attack in her father and mother.   Allergies Allergies  Allergen Reactions  . Metformin And Related Nausea And Vomiting  . Prednisone Nausea And Vomiting     Home Medications  Prior to Admission medications   Medication Sig Start Date End Date Taking? Authorizing Provider  acetaminophen (TYLENOL) 500 MG tablet Take 500 mg by mouth every 4 (four) hours as needed. FOR PAIN OR TEMP.    [provider]  apixaban (ELIQUIS) 5 MG TABS tablet TAKE 1 TABLET (5 MG TOTAL) BY MOUTH 2 (TWO) TIMES DAILY. 09/19/18   [name], NP  bisacodyl (DULCOLAX) 10 MG suppository Place 10 mg rectally as needed for moderate constipation.    [provider]  dicyclomine (BENTYL) 10 MG capsule Take 10 mg by mouth. TAKE 1 CAPSULE BY MOUTH THREE TIMES A DAY (DO NOT CRUSH)    [provider]  diltiazem (CARDIZEM CD) 240 MG 24 hr capsule Take 1 capsule (240 mg total) by mouth daily. 02/09/19   Regalado, Belkys A, MD  feeding supplement, ENSURE ENLIVE, (ENSURE ENLIVE) LIQD Take 1 Bottle by mouth daily.    [provider]  ferrous sulfate (KP FERROUS SULFATE) 325 (65 FE) MG tablet Take 325 mg by mouth daily with breakfast.    [provider]  furosemide (LASIX) 20 MG tablet Give one by mouth daily as needed for wt gain of 3 lbs in 1 day or 5 lbs in 1 week    [provider]  Insulin NPH, Human,, Isophane, (HUMULIN N KWIKPEN) 100 UNIT/ML Kiwkpen INJECT 12 UNITS SUBCUTANEOUSLY BEFORE BREAKFAST AND EVENING MEALS    [provider]  magnesium hydroxide (MILK OF MAGNESIA) 400 MG/5ML suspension Take 30 mLs by mouth daily as needed for mild constipation.    [provider]  NON FORMULARY NSA Med Pass 120 ml po daily    [provider]  ondansetron (ZOFRAN) 8 MG tablet Take 8 mg by mouth every 8 (eight) hours as needed for nausea.    [provider]  oxybutynin (DITROPAN-XL) 10 MG 24 hr tablet Take 10 mg by mouth daily. 12/16/18   [provider]  sertraline (ZOLOFT) 100 MG tablet TAKE 1 TABLET BY MOUTH ONCE DAILY (TAKE WITH 25MG  TO=125MG ) FOR DEPRESSION    [provider]  sertraline (ZOLOFT) 25 MG tablet TAKE 1 TABLET BY MOUTH ONCE DAILY (  TAKE WITH 100MG  TO=125MG ) FOR DEPRESSION    [provider]  simethicone (INFANTS GAS RELIEF) 40 MG/0.6ML drops Take 40 mg by mouth 4 (four) times daily as needed for flatulence.    [provider]  simvastatin (ZOCOR) 20 MG tablet Take 20 mg by mouth at bedtime.    [provider]  traZODone (DESYREL) 50 MG tablet Take 50 mg by mouth at bedtime.     [provider]     Critical care time: 45 mins     Georgann Housekeeper, AGACNP-BC [place] Pager [phone] or [phone]  03/11/2019 2:37 AM

## 2019-03-11 NOTE — Consult Note (Signed)
Chief Complaint: Patient was seen in consultation today for  Intra abdominal abscess drain placement (2) Chief Complaint  Patient presents with  . Abdominal Pain   at the request of Dr Rockne Coons; Dr Fredrik Cove   Supervising Physician: Markus Daft  Patient Status: [facility] - In-pt  History of Present Illness: Cassandra Allen is a 73 y.o. female   Hx Colon Ca Surgery 12/2018 Post surgical abscess and drain placed by IR 01/31/19  Was seen in [facility] and drain was evaluated 4/22 Evaluation revealed dislodged drain Was removed per Dr Kathlene Cote order  Now came to ED yesterday from SNF with Abd pain and fever Hypotensive and septic LD Eliquis yesterday INR 2.1 this am  CT IMPRESSION: 1. Persistent large gas and fluid collection in upper abdomen anterior to the stomach, without significant change. 2. Mild increase in size of large fluid collection extending across the left lower abdomen and central pelvis, following removal of percutaneous drainage catheter. 3. Caudate lobe liver metastasis, without significant interval change. No new or progressive metastatic disease identified. 4. Stable left pleural effusion and left lower lobe atelectasis.  Request now for new drain placement per Dr Elsworth Soho Dr Anselm Pancoast has reviewed imaging and approves procedure   Past Medical History:  Diagnosis Date  . Acute diastolic heart failure ([place]) 12/22/2018  . Arthritis   . Atrial fibrillation, chronic 12/22/2018  . Cancer of left colon ([place]) 10/30/2018  . Cancer of sigmoid colon  12/27/2018  . Diabetes mellitus without complication ([place])   . Hypertension   . Obesity (BMI 30-39.9) 12/27/2018    Past Surgical History:  Procedure Laterality Date  . COLONOSCOPY  10/2018   Dr [name].  Large cancer at splenic flexure,  Bulky sigmoid colon mass, Numerous polyps  . Intra-abdominal abscess drainage  01/31/2019   Abscess drainage grew Enterococcus faecalis  . IR IMAGING GUIDED PORT INSERTION  11/20/2018   . LAPAROTOMY N/A 12/27/2018   Procedure: LEFT SIGMOID COLECTOMY WITH HARTMANN POUCH AND END COLOSTOMY;  Surgeon: [name], MD;  Location: [facility];  Service: General;  Laterality: N/A;    Allergies: Metformin and related and Prednisone  Medications: Prior to Admission medications   Medication Sig Start Date End Date Taking? Authorizing Provider  acetaminophen (TYLENOL) 500 MG tablet Take 500 mg by mouth every 4 (four) hours as needed for mild pain or fever.    Yes [provider]  apixaban (ELIQUIS) 5 MG TABS tablet TAKE 1 TABLET (5 MG TOTAL) BY MOUTH 2 (TWO) TIMES DAILY. Patient taking differently: Take 5 mg by mouth 2 (two) times daily.  09/19/18  Yes [name], NP  bisacodyl (DULCOLAX) 10 MG suppository Place 10 mg rectally daily as needed for moderate constipation.    Yes [provider]  dicyclomine (BENTYL) 10 MG capsule Take 10 mg by mouth 3 (three) times daily. (DO NOT CRUSH)   Yes [provider]  diltiazem (CARDIZEM CD) 240 MG 24 hr capsule Take 1 capsule (240 mg total) by mouth daily. 02/09/19  Yes Regalado, Belkys A, MD  feeding supplement, ENSURE ENLIVE, (ENSURE ENLIVE) LIQD Take 1 Bottle by mouth daily.   Yes [provider]  ferrous sulfate (KP FERROUS SULFATE) 325 (65 FE) MG tablet Take 325 mg by mouth daily with breakfast.   Yes [provider]  furosemide (LASIX) 20 MG tablet Take 20 mg by mouth See admin instructions. Give one by mouth daily as needed for wt gain of 3 lbs in  1 day or 5 lbs in 1 week    Yes [provider]  Insulin NPH, Human,, Isophane, (HUMULIN N KWIKPEN) 100 UNIT/ML Kiwkpen Inject 12 Units into the skin 2 (two) times a day. INJECT 12 UNITS SUBCUTANEOUSLY BEFORE BREAKFAST AND EVENING MEALS    Yes [provider]  magnesium hydroxide (MILK OF MAGNESIA) 400 MG/5ML suspension Take 30 mLs by mouth daily as needed for mild constipation.   Yes [provider]  Melatonin 3 MG TABS Take  3 mg by mouth at bedtime.   Yes [provider]  NON FORMULARY NSA Med Pass 120 ml po daily   Yes [provider]  ondansetron (ZOFRAN) 8 MG tablet Take 8 mg by mouth every 8 (eight) hours as needed for nausea.   Yes [provider]  oxybutynin (DITROPAN-XL) 10 MG 24 hr tablet Take 10 mg by mouth daily. 12/16/18  Yes [provider]  sertraline (ZOLOFT) 100 MG tablet Take 125 mg by mouth daily. TAKE 1 TABLET BY MOUTH ONCE DAILY (TAKE WITH 25MG  TO=125MG ) FOR DEPRESSION   Yes [provider]  sertraline (ZOLOFT) 25 MG tablet Take 125 mg by mouth daily. TAKE 1 TABLET BY MOUTH ONCE DAILY (TAKE WITH 100MG  TO=125MG ) FOR DEPRESSION    Yes [provider]  simethicone (INFANTS GAS RELIEF) 40 MG/0.6ML drops Take 40 mg by mouth 4 (four) times daily as needed for flatulence.   Yes [provider]  simvastatin (ZOCOR) 20 MG tablet Take 20 mg by mouth at bedtime.   Yes [provider]  traZODone (DESYREL) 50 MG tablet Take 50 mg by mouth at bedtime.    Yes [provider]     Family History  Problem Relation Age of Onset  . Heart attack Mother   . Heart attack Father     Social History   Socioeconomic History  . Marital status: Divorced    Spouse name: Not on file  . Number of children: 2  . Years of education: Not on file  . Highest education level: Not on file  Occupational History  . Not on file  Social Needs  . Financial resource strain: Not on file  . Food insecurity:    Worry: Not on file    Inability: Not on file  . Transportation needs:    Medical: Not on file    Non-medical: Not on file  Tobacco Use  . Smoking status: Never Smoker  . Smokeless tobacco: Never Used  Substance and Sexual Activity  . Alcohol use: No  . Drug use: No  . Sexual activity: Not on file  Lifestyle  . Physical activity:    Days per week: Not on file    Minutes per session: Not on file  . Stress: Not on file  Relationships   . Social connections:    Talks on phone: Not on file    Gets together: Not on file    Attends religious service: Not on file    Active member of club or organization: Not on file    Attends meetings of clubs or organizations: Not on file    Relationship status: Not on file  Other Topics Concern  . Not on file  Social History Narrative  . Not on file    Review of Systems: A 12 point ROS discussed and pertinent positives are indicated in the HPI above.  All other systems are negative.  Review of Systems  Constitutional: Positive for activity change, appetite change, fatigue  and fever.  Respiratory: Negative for cough and shortness of breath.   Cardiovascular: Negative for chest pain.  Gastrointestinal: Positive for abdominal pain and nausea.  Neurological: Positive for weakness.  Psychiatric/Behavioral: Negative for behavioral problems and confusion.    Vital Signs: BP 103/67   Pulse (!) 108   Temp 97.8 F (36.6 C) (Oral)   Resp 19   Ht 5\' 2"  (1.575 m)   Wt 171 lb 4.8 oz (77.7 kg)   SpO2 100%   BMI 31.33 kg/m   Physical Exam Vitals signs reviewed.  Cardiovascular:     Rate and Rhythm: Normal rate and regular rhythm.  Pulmonary:     Effort: Pulmonary effort is normal.     Breath sounds: Normal breath sounds.  Abdominal:     General: There is distension.     Tenderness: There is abdominal tenderness.  Skin:    General: Skin is warm and dry.  Neurological:     Mental Status: She is alert and oriented to person, place, and time.  Psychiatric:        Behavior: Behavior normal.     Imaging: Ct Abdomen Pelvis W Contrast  Result Date: 03/11/2019 CLINICAL DATA:  Follow-up abdominal abscess. Previous sigmoid colectomy for colon carcinoma. Undergoing chemotherapy. EXAM: CT ABDOMEN AND PELVIS WITH CONTRAST TECHNIQUE: Multidetector CT imaging of the abdomen and pelvis was performed using the standard protocol following bolus administration of intravenous contrast.  CONTRAST:  143mL OMNIPAQUE IOHEXOL 300 MG/ML  SOLN COMPARISON:  03/05/2019 FINDINGS: Lower Chest: Small to moderate left pleural effusion and left lower lobe atelectasis show no significant change. Hepatobiliary: 3.9 cm hypovascular mass involving the caudate lobe of the liver shows no significant change compared to prior study. No new or enlarging liver masses are identified. Gallbladder is unremarkable. No evidence of biliary ductal dilatation. Pancreas:  No mass or inflammatory changes. Spleen: No evidence of splenomegaly. A few tiny sub-cm low-attenuation lesions in the spleen are stable. Adrenals/Urinary Tract: Stable 3.3 cm left adrenal mass, previously shown to represent an adenoma by MRI. Stable mild left renal atrophy. No evidence of renal masses or hydronephrosis. Unremarkable unopacified urinary bladder. Stomach/Bowel: Postop changes again seen from previous left hemicolectomy with transverse colostomy. A large gas and fluid collection is again seen in the upper abdomen anterior to the stomach. This measures 13.2 x 9.7 cm, without significant change in size compared to previous study. Previously seen left abdominal percutaneous drainage catheter has been removed. A persistent fluid collection in the left lower abdomen and extending into the central pelvis is seen at this location. This collection measures 20.2 x 3.9 cm, with mildly increased in size from 19.4 x 2.3 cm previously. No new fluid collections are identified. No evidence of bowel obstruction no evidence of free intraperitoneal air. Vascular/Lymphatic: No pathologically enlarged lymph nodes. No abdominal aortic aneurysm. Reproductive:  No mass or other significant abnormality. Other:  None. Musculoskeletal:  No suspicious bone lesions identified. IMPRESSION: 1. Persistent large gas and fluid collection in upper abdomen anterior to the stomach, without significant change. 2. Mild increase in size of large fluid collection extending across the  left lower abdomen and central pelvis, following removal of percutaneous drainage catheter. 3. Caudate lobe liver metastasis, without significant interval change. No new or progressive metastatic disease identified. 4. Stable left pleural effusion and left lower lobe atelectasis. Electronically Signed   By: Earle Gell M.D.   On: 03/11/2019 00:10   Ct Abdomen Pelvis W Contrast  Result Date: 03/05/2019 CLINICAL  DATA:  Follow-up abscess drain EXAM: CT ABDOMEN AND PELVIS WITH CONTRAST TECHNIQUE: Multidetector CT imaging of the abdomen and pelvis was performed using the standard protocol following bolus administration of intravenous contrast. CONTRAST:  155mL ISOVUE-300 IOPAMIDOL (ISOVUE-300) INJECTION 61% COMPARISON:  01/31/2019 FINDINGS: Lower chest: Moderate left pleural effusion and associated basilar atelectasis are stable. Hepatobiliary: Small hypodensity in the left lobe of the liver on image 15 is stable. The mass encompassing much of the caudate lobe now measures up to 4.4 cm and previously measured 2.6 cm. Unremarkable gallbladder. Pancreas: Unremarkable Spleen: Small hypodensities in the spleen are stable. Adrenals/Urinary Tract: 3.2 cm left adrenal mass is stable. Unremarkable right adrenal gland. Small hypodensities in the kidneys are stable. Stomach/Bowel: Stomach is decompressed. There is mass effect upon the stomach by a large fat, gas and fluid filled cavity. Small bowel is decompressed. Right lower quadrant colostomy is noted. Status post left hemicolectomy. Vascular/Lymphatic: Aortic and iliac artery vascular calcifications. No abnormal retroperitoneal adenopathy. Reproductive: Uterus is within normal limits. Other: A drain has been placed in the previously visualized large lower abdominal abscess cavity. Today, the abscess cavity measures 18.1 x 15 x 3.3 cm. Previously, this measured 12.6 x 24.6 x 18.4 cm. The drain is positioned within the left side of the abscess cavity. The abscess does extend  centrally towards the midline of the upper pelvis. The upper suspected abscess cavity with mass effect upon the stomach measures 13.8 x 9.4 cm and previously measured 16.6 x 8.4 cm. It is not significantly changed. Small amount of ascites about the liver. Musculoskeletal: No vertebral compression deformity. IMPRESSION: After drainage, the lower abdominal abscess cavity has significantly improved. There is an upper abdominal fluid, gas, and fat containing cavity which is not significantly changed. Additional large abscess cavity is not excluded. Liver lesions and left adrenal mass are noted. The mass in the caudate lobe has worsened. The mass in the left adrenal gland is stable. Moderate left pleural effusion is stable. Small hypodensities in the spleen are stable. Electronically Signed   By: Marybelle Killings M.D.   On: 03/05/2019 10:22   Dg Chest Port 1 View  Result Date: 03/10/2019 CLINICAL DATA:  Initial evaluation for acute fever. EXAM: PORTABLE CHEST 1 VIEW COMPARISON:  Prior radiograph from 02/05/2019 FINDINGS: Right-sided Port-A-Cath in place, stable. Cardiomegaly, unchanged. Mediastinal silhouette normal. Aortic atherosclerosis. Persistent small to moderate left pleural effusion. Associated left basilar opacity could reflect atelectasis or infiltrate. Underlying mild diffuse pulmonary interstitial edema. No pneumothorax. No acute osseous finding. IMPRESSION: 1. Persistent small to moderate left pleural effusion with associated left basilar opacity, which may reflect atelectasis or infiltrate. 2. Cardiomegaly with underlying mild diffuse pulmonary interstitial edema. 3. Aortic atherosclerosis. Electronically Signed   By: Jeannine Boga M.D.   On: 03/10/2019 22:03    Labs:  CBC: Recent Labs    02/11/19 02/15/19 03/10/19 2058 03/10/19 2200 03/11/19 0557  WBC 8.9 8.9 12.7*  --  15.2*  HGB 9.7* 9.2* 11.1* 12.2 8.7*  HCT 31* 28* 38.6 36.0 30.9*  PLT 343 315 359  --  299    COAGS: Recent Labs     12/27/18 0325 12/28/18 0323 12/29/18 2221 12/30/18 1100 01/31/19 1600 03/11/19 0238  INR 2.14 1.95  --   --  2.6* 2.1*  APTT 34  --  111* 97*  --  35    BMP: Recent Labs    02/08/19 0301 02/09/19 0741 02/11/19 02/14/19 03/10/19 2058 03/10/19 2200 03/11/19 0557  NA 137 138 140  143 141 142 141  K 4.7 5.1 4.4 4.3 3.1* 3.2* 3.7  CL 103 99 101 101 102  --  104  CO2 26 31 28 23 31   --  27  GLUCOSE 300* 132*  --   --  174*  --  114*  BUN 24* 24* 19 25* 20  --  20  CALCIUM 8.3* 8.7* 9.1 8.7 8.7*  --  8.0*  CREATININE 0.99 0.75 0.7 0.7 1.09*  --  0.83  GFRNONAA 57* >60  --   --  51*  --  >60  GFRAA >60 >60  --   --  59*  --  >60    LIVER FUNCTION TESTS: Recent Labs    12/30/18 0319 12/31/18 0337  01/20/19 01/23/19 01/31/19 1301 03/10/19 2058  BILITOT 0.9 0.6  --   --   --  0.5 2.3*  AST 21 20  --  21 12* 11* 237*  ALT 23 20  --  33 20 15 95*  ALKPHOS 87 84  --   --   --  234* 1,377*  PROT 4.4* 4.7*  --  4.8* 5.3*  5.3* 5.3* 6.0*  ALBUMIN 1.5* 1.5*   < > 2.2 2.4  2.4 1.6* 2.5*   < > = values in this interval not displayed.    TUMOR MARKERS: No results for input(s): AFPTM, CEA, CA199, CHROMGRNA in the last 8760 hours.  Assessment and Plan:  Colon Cancer Hx Surgery 12/2018 Post op abscess and drain 01/31/19 Drain out since 4/22 SNF has sent pt to ED for eval for fever; abd pain and hypotension Now scheduled for new drain(s) placement Risks and benefits discussed with the patient including bleeding, infection, damage to adjacent structures, bowel perforation/fistula connection, and sepsis.  All of the patient's questions were answered, patient is agreeable to proceed. Consent signed and in chart.   Thank you for this interesting consult.  I greatly enjoyed meeting Cassandra Allen [facility] and look forward to participating in their care.  A copy of this report was sent to the requesting provider on this date.  Electronically Signed: Lavonia Drafts, PA-C  03/11/2019, 12:37 PM   I spent a total of 40 Minutes    in face to face in clinical consultation, greater than 50% of which was counseling/coordinating care for abscess drains

## 2019-03-11 NOTE — ED Notes (Signed)
ED TO INPATIENT HANDOFF REPORT  ED Nurse Name and Phone #: [phone] Lucita Ferrara Name/Age/Gender [name] 73 y.o. female Room/Bed: 021C/021C  Code Status   Code Status: Full Code  Home/SNF/Other Skilled nursing facility Patient oriented to: self, place, time and situation Is this baseline? Yes   Triage Complete: Triage complete  Chief Complaint ABD Pain   Triage Note No notes on file   Allergies Allergies  Allergen Reactions  . Metformin And Related Nausea And Vomiting  . Prednisone Nausea And Vomiting    Level of Care/Admitting Diagnosis ED Disposition    ED Disposition Condition Comment   Admit  Hospital Area: [facility] [[id number]]  Level of Care: Progressive [102]  Covid Evaluation: N/A  Diagnosis: Abscess of abdominal cavity [facility]) [[id number]]  Admitting Physician: Ivor Costa [[id number]]  Attending Physician: Ivor Costa [id number]  Estimated length of stay: past midnight tomorrow  Certification:: I certify this patient will need inpatient services for at least 2 midnights  PT Class (Do Not Modify): Inpatient [101]  PT Acc Code (Do Not Modify): Private [1]       B Medical/Surgery History Past Medical History:  Diagnosis Date  . Acute diastolic heart failure ([place]) 12/22/2018  . Arthritis   . Atrial fibrillation, chronic 12/22/2018  . Cancer of left colon ([place]) 10/30/2018  . Cancer of sigmoid colon  12/27/2018  . Diabetes mellitus without complication ([place])   . Hypertension   . Obesity (BMI 30-39.9) 12/27/2018   Past Surgical History:  Procedure Laterality Date  . COLONOSCOPY  10/2018   Dr [name].  Large cancer at splenic flexure,  Bulky sigmoid colon mass, Numerous polyps  . Intra-abdominal abscess drainage  01/31/2019   Abscess drainage grew Enterococcus faecalis  . IR IMAGING GUIDED PORT INSERTION  11/20/2018  . LAPAROTOMY N/A 12/27/2018   Procedure: LEFT SIGMOID COLECTOMY WITH HARTMANN POUCH AND END COLOSTOMY;  Surgeon: [name], MD;   Location: [facility];  Service: General;  Laterality: N/A;     A IV Location/Drains/Wounds Patient Lines/Drains/Airways Status   Active Line/Drains/Airways    Name:   Placement date:   Placement time:   Site:   Days:   Implanted Port Right Chest   -    -    Chest      Implanted Port Right Chest   -    -    Chest      Peripheral IV 03/10/19 Left;Anterior;Distal Forearm   03/10/19    2313    Forearm   1   Closed System Drain 1 Left Abdomen Bulb (JP) 12 Fr.   01/31/19    1726    Abdomen   39   Colostomy RUQ   12/27/18    1326    RUQ   74   External Urinary Catheter   01/31/19    1425    -   39   Wound / Incision (Open or Dehisced) 01/31/19 Incision - Open Abdomen Left;Anterior;Medial Open incision with necrotic base.  Sutures visible   01/31/19    1303    Abdomen   39          Intake/Output Last 24 hours No intake or output data in [facility] 24 hours ending 03/11/19 0108  Labs/Imaging Results for orders placed or performed during [facility] hospital encounter of 03/10/19 (from [facility] past 48 hour(s))  Urinalysis, Routine w reflex microscopic     Status: Abnormal   Collection Time: 03/10/19 12:41 AM  Result Value  Ref Range   Color, Urine AMBER (A) YELLOW    Comment: BIOCHEMICALS MAY BE AFFECTED BY COLOR   APPearance CLEAR CLEAR   Specific Gravity, Urine 1.032 (H) 1.005 - 1.030   pH 5.0 5.0 - 8.0   Glucose, UA NEGATIVE NEGATIVE mg/dL   Hgb urine dipstick SMALL (A) NEGATIVE   Bilirubin Urine NEGATIVE NEGATIVE   Ketones, ur NEGATIVE NEGATIVE mg/dL   Protein, ur 30 (A) NEGATIVE mg/dL   Nitrite POSITIVE (A) NEGATIVE   Leukocytes,Ua MODERATE (A) NEGATIVE   RBC / HPF 0-5 0 - 5 RBC/hpf   WBC, UA 21-50 0 - 5 WBC/hpf   Bacteria, UA NONE SEEN NONE SEEN   Squamous Epithelial / LPF 0-5 0 - 5   Mucus PRESENT    Hyaline Casts, UA PRESENT     Comment: Performed at [facility], [address], [place], [place] [address]  Lactic acid, plasma     Status: Abnormal   Collection Time: 03/10/19  8:58 PM   Result Value Ref Range   Lactic Acid, Venous 2.5 (HH) 0.5 - 1.9 mmol/L    Comment: CRITICAL RESULT CALLED TO, READ BACK BY AND VERIFIED WITH: Cassandra Allen [facility] 03/10/19 2205 WAYK Performed at [facility], [place] [address] [place], [place] [address]   Comprehensive metabolic panel     Status: Abnormal   Collection Time: 03/10/19  8:58 PM  Result Value Ref Range   Sodium 141 135 - 145 mmol/L   Potassium 3.1 (L) 3.5 - 5.1 mmol/L   Chloride 102 98 - 111 mmol/L   CO2 31 22 - 32 mmol/L   Glucose, Bld 174 (H) 70 - 99 mg/dL   BUN 20 8 - 23 mg/dL   Creatinine, Ser 1.09 (H) 0.44 - 1.00 mg/dL   Calcium 8.7 (L) 8.9 - 10.3 mg/dL   Total Protein 6.0 (L) 6.5 - 8.1 g/dL   Albumin 2.5 (L) 3.5 - 5.0 g/dL   AST 237 (H) 15 - 41 U/L   ALT 95 (H) 0 - 44 U/L   Alkaline Phosphatase 1,377 (H) 38 - 126 U/L   Total Bilirubin 2.3 (H) 0.3 - 1.2 mg/dL   GFR calc non Af Amer 51 (L) >60 mL/min   GFR calc Af Amer 59 (L) >60 mL/min   Anion gap 8 5 - 15    Comment: Performed at [facility], [place] [address] [place], [place] [address]  CBC WITH DIFFERENTIAL     Status: Abnormal   Collection Time: 03/10/19  8:58 PM  Result Value Ref Range   WBC 12.7 (H) 4.0 - 10.5 K/uL   RBC 4.20 3.87 - 5.11 MIL/uL   Hemoglobin 11.1 (L) 12.0 - 15.0 g/dL   HCT 38.6 36.0 - 46.0 %   MCV 91.9 80.0 - 100.0 fL   MCH 26.4 26.0 - 34.0 pg   MCHC 28.8 (L) 30.0 - 36.0 g/dL   RDW 17.9 (H) 11.5 - 15.5 %   Platelets 359 150 - 400 K/uL   nRBC 0.0 0.0 - 0.2 %   Neutrophils Relative % 93 %   Neutro Abs 11.8 (H) 1.7 - 7.7 K/uL   Lymphocytes Relative 2 %   Lymphs Abs 0.2 (L) 0.7 - 4.0 K/uL   Monocytes Relative 5 %   Monocytes Absolute 0.6 0.1 - 1.0 K/uL   Eosinophils Relative 0 %   Eosinophils Absolute 0.1 0.0 - 0.5 K/uL   Basophils Relative 0 %   Basophils Absolute 0.0  0.0 - 0.1 K/uL   Immature Granulocytes 0 %   Abs Immature Granulocytes 0.05 0.00 - 0.07 K/uL    Comment: Performed at [facility], [place] [address] [place], [place] [address]  Brain natriuretic peptide     Status: Abnormal   Collection Time: 03/10/19  8:58 PM  Result Value Ref Range   B Natriuretic Peptide 359.3 (H) 0.0 - 100.0 pg/mL    Comment: Performed at [facility] [address] [place], [place] [address]  Lipase, blood     Status: None   Collection Time: 03/10/19  8:58 PM  Result Value Ref Range   Lipase 38 11 - 51 U/L    Comment: Performed at [facility] [address], [place], [place] [address]  Procalcitonin     Status: None   Collection Time: 03/10/19  8:58 PM  Result Value Ref Range   Procalcitonin 4.38 ng/mL    Comment:        Interpretation: PCT > 2 ng/mL: Systemic infection (sepsis) is likely, unless other causes are known. (NOTE)       Sepsis PCT Algorithm           Lower Respiratory Tract                                      Infection PCT Algorithm    ----------------------------     ----------------------------         PCT < 0.25 ng/mL                PCT < 0.10 ng/mL         Strongly encourage             Strongly discourage   discontinuation of antibiotics    initiation of antibiotics    ----------------------------     -----------------------------       PCT 0.25 - 0.50 ng/mL            PCT 0.10 - 0.25 ng/mL               OR       >80% decrease in PCT            Discourage initiation of                                            antibiotics      Encourage discontinuation           of antibiotics    ----------------------------     -----------------------------         PCT >= 0.50 ng/mL              PCT 0.26 - 0.50 ng/mL               AND       <80% decrease in PCT              Encourage initiation of                                             antibiotics       Encourage continuation  of antibiotics    ----------------------------     -----------------------------        PCT >= 0.50 ng/mL                  PCT > 0.50 ng/mL               AND         increase in PCT                   Strongly encourage                                      initiation of antibiotics    Strongly encourage escalation           of antibiotics                                     -----------------------------                                           PCT <= 0.25 ng/mL                                                 OR                                        > 80% decrease in PCT                                     Discontinue / Do not initiate                                             antibiotics Performed at [facility], [address], [place], [place] [address]   Troponin I - ONCE - STAT     Status: Abnormal   Collection Time: 03/10/19  8:58 PM  Result Value Ref Range   Troponin I 0.08 (HH) <0.03 ng/mL    Comment: CRITICAL RESULT CALLED TO, READ BACK BY AND VERIFIED WITH: Cassandra Allen [facility] 03/10/19 2227 WAYK Performed at [facility], [place] [address], , [place] [address]   POCT I-Stat EG7     Status: Abnormal   Collection Time: 03/10/19 10:00 PM  Result Value Ref Range   pH, Ven 7.431 (H) 7.250 - 7.430   pCO2, Ven 40.4 (L) 44.0 - 60.0 mmHg   pO2, Ven 61.0 (H) 32.0 - 45.0 mmHg   Bicarbonate 26.9 20.0 - 28.0 mmol/L   TCO2 28 22 - 32 mmol/L   O2 Saturation 92.0 %   Acid-Base Excess 2.0 0.0 - 2.0 mmol/L   Sodium 142 135 - 145 mmol/L   Potassium 3.2 (L) 3.5 - 5.1 mmol/L   Calcium, Ion 1.16 1.15 - 1.40 mmol/L  HCT 36.0 36.0 - 46.0 %   Hemoglobin 12.2 12.0 - 15.0 g/dL   Patient temperature HIDE    Sample type VENOUS    Ct Abdomen Pelvis W Contrast  Result Date: 03/11/2019 CLINICAL DATA:  Follow-up abdominal abscess. Previous sigmoid colectomy for colon carcinoma. Undergoing chemotherapy. EXAM: CT ABDOMEN AND PELVIS WITH CONTRAST TECHNIQUE: Multidetector CT imaging of Cassandra abdomen and pelvis was performed using [facility] standard protocol following bolus administration of intravenous contrast. CONTRAST:  123mL OMNIPAQUE IOHEXOL 300 MG/ML  SOLN COMPARISON:   03/05/2019 FINDINGS: Lower Chest: Small to moderate left pleural effusion and left lower lobe atelectasis show no significant change. Hepatobiliary: 3.9 cm hypovascular mass involving Cassandra caudate lobe of Cassandra liver shows no significant change compared to prior study. No new or enlarging liver masses are identified. Gallbladder is unremarkable. No evidence of biliary ductal dilatation. Pancreas:  No mass or inflammatory changes. Spleen: No evidence of splenomegaly. A few tiny sub-cm low-attenuation lesions in Cassandra spleen are stable. Adrenals/Urinary Tract: Stable 3.3 cm left adrenal mass, previously shown to represent an adenoma by MRI. Stable mild left renal atrophy. No evidence of renal masses or hydronephrosis. Unremarkable unopacified urinary bladder. Stomach/Bowel: Postop changes again seen from previous left hemicolectomy with transverse colostomy. A large gas and fluid collection is again seen in Cassandra upper abdomen anterior to Cassandra stomach. This measures 13.2 x 9.7 cm, without significant change in size compared to previous study. Previously seen left abdominal percutaneous drainage catheter has been removed. A persistent fluid collection in Cassandra left lower abdomen and extending into Cassandra central pelvis is seen at this location. This collection measures 20.2 x 3.9 cm, with mildly increased in size from 19.4 x 2.3 cm previously. No new fluid collections are identified. No evidence of bowel obstruction no evidence of free intraperitoneal air. Vascular/Lymphatic: No pathologically enlarged lymph nodes. No abdominal aortic aneurysm. Reproductive:  No mass or other significant abnormality. Other:  None. Musculoskeletal:  No suspicious bone lesions identified. IMPRESSION: 1. Persistent large gas and fluid collection in upper abdomen anterior to Cassandra stomach, without significant change. 2. Mild increase in size of large fluid collection extending across Cassandra left lower abdomen and central pelvis, following removal of  percutaneous drainage catheter. 3. Caudate lobe liver metastasis, without significant interval change. No new or progressive metastatic disease identified. 4. Stable left pleural effusion and left lower lobe atelectasis. Electronically Signed   By: Earle Gell M.D.   On: 03/11/2019 00:10   Dg Chest Port 1 View  Result Date: 03/10/2019 CLINICAL DATA:  Initial evaluation for acute fever. EXAM: PORTABLE CHEST 1 VIEW COMPARISON:  Prior radiograph from 02/05/2019 FINDINGS: Right-sided Port-A-Cath in place, stable. Cardiomegaly, unchanged. Mediastinal silhouette normal. Aortic atherosclerosis. Persistent small to moderate left pleural effusion. Associated left basilar opacity could reflect atelectasis or infiltrate. Underlying mild diffuse pulmonary interstitial edema. No pneumothorax. No acute osseous finding. IMPRESSION: 1. Persistent small to moderate left pleural effusion with associated left basilar opacity, which may reflect atelectasis or infiltrate. 2. Cardiomegaly with underlying mild diffuse pulmonary interstitial edema. 3. Aortic atherosclerosis. Electronically Signed   By: Jeannine Boga M.D.   On: 03/10/2019 22:03    Pending Labs Unresulted Labs (From admission, onward)    Start     Ordered   03/11/19 0500  Magnesium  Tomorrow morning,   R     03/11/19 0100   03/11/19 0500  Hemoglobin A1c  Tomorrow morning,   R     03/11/19 0101   03/11/19 0500  Lipid panel  Tomorrow morning,   R    Comments:  Please obtain as a fasting lipid panel - should not have eaten/ drank food for 8 hours prior to labs.    03/11/19 0101   03/11/19 4098  Basic metabolic panel  Tomorrow morning,   R     03/11/19 0104   03/11/19 0500  CBC  Tomorrow morning,   R     03/11/19 0104   03/11/19 0105  Protime-INR  Once,   R     03/11/19 0104   03/11/19 0105  APTT  Once,   R     03/11/19 0104   03/11/19 0102  Troponin I - Now Then Q6H  Now then every 6 hours,   R     03/11/19 0101   03/10/19 2058  Lactic acid,  plasma  Now then every 2 hours,   STAT     03/10/19 2058   03/10/19 2058  Blood Culture (routine x 2)  BLOOD CULTURE X 2,   STAT     03/10/19 2058   03/10/19 2058  Blood gas, venous ([facility], [facility], [facility])  ONCE - STAT,   STAT     03/10/19 2058   03/10/19 2058  Urinalysis, Complete w Microscopic  ONCE - STAT,   STAT     03/10/19 2058   03/10/19 2058  Urine culture  ONCE - STAT,   STAT     03/10/19 2058          Vitals/Pain Today's Vitals   03/11/19 0015 03/11/19 0030 03/11/19 0045 03/11/19 0100  BP: (!) 106/56 100/61 (!) 85/51 (!) 90/55  Pulse: (!) 141 (!) 29 (!) 144 61  Resp: (!) 30 (!) 28 (!) 27 19  Temp:      TempSrc:      SpO2: 99% 97% 98% 100%  Weight:      Height:      PainSc:        Isolation Precautions No active isolations  Medications Medications  metroNIDAZOLE (FLAGYL) IVPB 500 mg (500 mg Intravenous New Bag/Given 03/11/19 0011)  sodium chloride flush (NS) 0.9 % injection 10-40 mL (has no administration in time range)  diltiazem (CARDIZEM) 100 mg in dextrose 5% 158mL (1 mg/mL) infusion (0 mg/hr Intravenous Stopped 03/11/19 0003)  ceFEPIme (MAXIPIME) 2 g in sodium chloride 0.9 % 100 mL IVPB (has no administration in time range)  Insulin NPH (Human) (Isophane) (HUMULIN N) Kwikpen 5 Units (has no administration in time range)  morphine 2 MG/ML injection 1 mg (has no administration in time range)  oxyCODONE (Oxy IR/ROXICODONE) immediate release tablet 5 mg (has no administration in time range)  potassium chloride SA (K-DUR) CR tablet 40 mEq (has no administration in time range)  metroNIDAZOLE (FLAGYL) IVPB 500 mg (has no administration in time range)  ondansetron (ZOFRAN) tablet 4 mg (has no administration in time range)    Or  ondansetron (ZOFRAN) injection 4 mg (has no administration in time range)  insulin aspart (novoLOG) injection 0-9 Units (has no administration in time range)  0.9 %  sodium chloride infusion (has no administration in time range)  lactated ringers  bolus 1,000 mL (0 mLs Intravenous Stopped 03/10/19 2331)  ceFEPIme (MAXIPIME) 2 g in sodium chloride 0.9 % 100 mL IVPB (0 g Intravenous Stopped 03/10/19 2219)  diltiazem (CARDIZEM) injection 20 mg (20 mg Intravenous Given 03/10/19 2133)  ondansetron (ZOFRAN) injection 4 mg (4 mg Intravenous Given 03/10/19 2143)  morphine 4 MG/ML injection 4 mg (  4 mg Intravenous Given 03/10/19 2143)  acetaminophen (TYLENOL) tablet 1,000 mg (1,000 mg Oral Given 03/11/19 0006)  iohexol (OMNIPAQUE) 300 MG/ML solution 100 mL (100 mLs Intravenous Contrast Given 03/10/19 2332)  lactated ringers bolus 1,000 mL (1,000 mLs Intravenous New Bag/Given 03/11/19 0008)    Mobility walks with device High fall risk   Focused Assessments Cardiac Assessment Handoff:  Cardiac Rhythm: Atrial fibrillation Lab Results  Component Value Date   TROPONINI 0.08 ([place]) 03/10/2019   No results found for: DDIMER Does [facility] Patient currently have chest pain? No     R Recommendations: See Admitting Provider Note  Report given to:   Additional Notes: N/A

## 2019-03-11 NOTE — Procedures (Signed)
Interventional Radiology Procedure:   Indications:  Intra-abdominal abscess collections  Procedure: CT guided drain placements in mid upper abdominal collection and left lower abdominal abscess collection  Findings: 150 ml green purulent looking fluid removed from the complex mid upper collection after 12 Fr drain placement.  30 ml of yellow purulent fluid removed from left lower abscess collection after placement of 12 Fr drain.    Complications: None     EBL: Less than 10 ml  Plan: Send fluid for culture.  Will flush both drains and attach to suction bulbs.     Aydia Maj R. Anselm Pancoast, MD  Pager: [phone]

## 2019-03-11 NOTE — Sedation Documentation (Signed)
MD aware of PT and INR lab results and of last dose of home eliquis.

## 2019-03-11 NOTE — Consult Note (Signed)
[facility] Surgery Consult Note  Cassandra Allen [facility] 21-Aug-1946  [id number].    Requesting MD: Ddr. Rockwell Alexandria  Chief Complaint/Reason for Consult: Intraabdominal abscess  HPI: Patient is a 73 year old female was seen by Dr. Autumn Messing on 12/26/2018. She had a known colon cancer 2 sites, one in the sigmoid and 1 near the splenic flexure.  She had been on chemotherapy.  She developed abdominal distention nausea and vomiting.  Work-up in the ED showed she had obstructing cancer in the splenic flexure of the colon with metastatic disease to the liver.  She was also on Eliquis for atrial fibrillation and had taken her Eliquis the day prior to admission.  12/25/2018. She was admitted to Medicine for IV hydration resuscitation.  An NG tube was placed to decompress her.  NG drainage was feculent in appearance.  She continued to have high output drainage.  A discussion about palliative care versus surgery was held on 12/27/2018.  Patient wanted to go forward with surgery and was taken the operating room by Dr. [name] on 12/27/2018.  She underwent laparotomy with mobilization of the splenic flexure and resection of the sigmoid, descending and distal transverse colon with a Hartman's pouch at the end of the colostomy in the right upper quadrant.  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  Sigmoid mass was consistent with a large villous adenoma.  She was discharged home on 01/10/2019.   Additional issues during that hospitalization include the enterococcus UTI/bacteriuria treated with amoxicillin.    Follow-up in the skilled nursing facility on 01/31/2019 included a CT scan which showed large rim-enhancing fluid collection with multiple foci over the lower abdomen with a likely intra-abdominal abscess.  She was readmitted and under IR drain 01/31/19, Dr. Earleen Newport.  She was transitioned to oral abx and discharged again on 02/09/19.   Yesterday 4/27 she returned to  the ED with ongoing discomfort, she was seen in the office by Dr. Hassell Done on 03/09/19 per SNF report.  Work up shows a temp of 100.7, tachycardia with HR 171, stable BP.  Prior drain has been removed.  Labs show the WBC 12.7 lactate 2.5, procalcitonin 4.38, mild troponin elevatations, 0.08, 0.06,0.05 respectively, Alk phos 1377 AST237, ALT 95, T bil 2.3.  CT scan shows drain out and a 20.2 x 3.9 cm fluid collection. She has been readmitted to Medicine and IR requested.  We are ask to see.  The last abscess culture grew Enterococcus Faecalis; sensitive to ampicillin, gentamicin, and vancomycin.  CT 01/31/19 : 12.6 x 24.6 x 18.4 cm enhancing fluid collection lower abdomen extending to the mid left upper abdomen, likely intra-abdominal abscess IR drain placement 01/31/2019 CT 03/05/2019: Abscess cavity measured 18.1 x 15 x 3.3 cm.  Previously this measured 12.6 x 24.6 x 18.4 drain was positioned within the left side of the abscess cavity abscess did not extend centrally toward midline.  There is also suspected upper abscess measuring 13.8 x 9.4 improved from prior measurement of 16.6 x 8.4 CT 03/08/2018 showed the prior drain have been removed.  There is a persistent fluid collection left lower abdomen extending in central pelvis collection measured 20.2 x 3.9 with mildly increased size from 19.4 x 2.3 on the last scan.  No new fluid collections.  Caudate lobe of the liver shows metastases without significant interval change.  No new or progressive metastatic disease was identified.  Stable left pleural effusion and left lower lobe atelectasis.    ROS: Review of  Systems  Constitutional: Negative for chills, diaphoresis, fever, malaise/fatigue and weight loss.  HENT: Negative.   Eyes: Negative.   Respiratory: Positive for shortness of breath (DOE walking). Negative for cough, hemoptysis, sputum production and wheezing.   Cardiovascular: Positive for palpitations.       She came in with RVR but does not feel  it.  No CP with rapid rate.  Gastrointestinal: Positive for abdominal pain (rather diffuse, points to lower abdomen right and left, along with upper abdomen right and left), heartburn, nausea and vomiting. Negative for blood in stool, constipation, diarrhea and melena.  Genitourinary: Negative.   Musculoskeletal: Positive for back pain (with abdominal pain).       Still cannot walk without assistance  Skin: Negative.   Neurological: Negative.   Endo/Heme/Allergies: Bruises/bleeds easily (on eliquis).  Psychiatric/Behavioral: Positive for depression.      Family History  Problem Relation Age of Onset  . Heart attack Mother   . Heart attack Father     Past Medical History:  Diagnosis Date  . Acute diastolic heart failure ([place]) 12/22/2018  . Arthritis   . Atrial fibrillation, chronic 12/22/2018  . Cancer of left colon ([place]) 10/30/2018  . Cancer of sigmoid colon  12/27/2018  . Diabetes mellitus without complication ([place])   . Hypertension   . Obesity (BMI 30-39.9) 12/27/2018    Past Surgical History:  Procedure Laterality Date  . COLONOSCOPY  10/2018   Dr [name].  Large cancer at splenic flexure,  Bulky sigmoid colon mass, Numerous polyps  . Intra-abdominal abscess drainage  01/31/2019   Abscess drainage grew Enterococcus faecalis  . IR IMAGING GUIDED PORT INSERTION  11/20/2018  . LAPAROTOMY N/A 12/27/2018   Procedure: LEFT SIGMOID COLECTOMY WITH HARTMANN POUCH AND END COLOSTOMY;  Surgeon: [name], MD;  Location: [facility];  Service: General;  Laterality: N/A;    Social History:  reports that she has never smoked. She has never used smokeless tobacco. She reports that she does not drink alcohol or use drugs.  Allergies:  Allergies  Allergen Reactions  . Metformin And Related Nausea And Vomiting  . Prednisone Nausea And Vomiting    Medications Prior to Admission  Medication Sig Dispense Refill  . acetaminophen (TYLENOL) 500 MG tablet Take 500 mg by mouth every 4 (four) hours  as needed for mild pain or fever.     Marland Kitchen apixaban (ELIQUIS) 5 MG TABS tablet TAKE 1 TABLET (5 MG TOTAL) BY MOUTH 2 (TWO) TIMES DAILY. (Patient taking differently: Take 5 mg by mouth 2 (two) times daily. ) 180 tablet 2  . bisacodyl (DULCOLAX) 10 MG suppository Place 10 mg rectally daily as needed for moderate constipation.     . dicyclomine (BENTYL) 10 MG capsule Take 10 mg by mouth 3 (three) times daily. (DO NOT CRUSH)    . diltiazem (CARDIZEM CD) 240 MG 24 hr capsule Take 1 capsule (240 mg total) by mouth daily. 30 capsule 0  . feeding supplement, ENSURE ENLIVE, (ENSURE ENLIVE) LIQD Take 1 Bottle by mouth daily.    . ferrous sulfate (KP FERROUS SULFATE) 325 (65 FE) MG tablet Take 325 mg by mouth daily with breakfast.    . furosemide (LASIX) 20 MG tablet Take 20 mg by mouth See admin instructions. Give one by mouth daily as needed for wt gain of 3 lbs in 1 day or 5 lbs in 1 week     . Insulin NPH, Human,, Isophane, (HUMULIN N KWIKPEN) 100 UNIT/ML Kiwkpen Inject 12 Units into  the skin 2 (two) times a day. INJECT 12 UNITS SUBCUTANEOUSLY BEFORE BREAKFAST AND EVENING MEALS     . magnesium hydroxide (MILK OF MAGNESIA) 400 MG/5ML suspension Take 30 mLs by mouth daily as needed for mild constipation.    . Melatonin 3 MG TABS Take 3 mg by mouth at bedtime.    . NON FORMULARY NSA Med Pass 120 ml po daily    . ondansetron (ZOFRAN) 8 MG tablet Take 8 mg by mouth every 8 (eight) hours as needed for nausea.    Marland Kitchen oxybutynin (DITROPAN-XL) 10 MG 24 hr tablet Take 10 mg by mouth daily.    . sertraline (ZOLOFT) 100 MG tablet Take 125 mg by mouth daily. TAKE 1 TABLET BY MOUTH ONCE DAILY (TAKE WITH '25MG'$  TO='125MG'$ ) FOR DEPRESSION    . sertraline (ZOLOFT) 25 MG tablet Take 125 mg by mouth daily. TAKE 1 TABLET BY MOUTH ONCE DAILY (TAKE WITH '100MG'$  TO='125MG'$ ) FOR DEPRESSION     . simethicone (INFANTS GAS RELIEF) 40 MG/0.6ML drops Take 40 mg by mouth 4 (four) times daily as needed for flatulence.    . simvastatin (ZOCOR) 20 MG  tablet Take 20 mg by mouth at bedtime.    . traZODone (DESYREL) 50 MG tablet Take 50 mg by mouth at bedtime.       Blood pressure (!) 99/56, pulse 96, temperature 97.6 F (36.4 C), temperature source Oral, resp. rate 20, height '5\' 2"'$  (1.575 m), weight 77.7 kg, SpO2 100 %. Physical Exam: Physical Exam Constitutional:      General: She is not in acute distress.    Appearance: She is well-developed. She is obese. She is ill-appearing. She is not toxic-appearing or diaphoretic.  HENT:     Head: Normocephalic and atraumatic.     Mouth/Throat:     Mouth: Mucous membranes are moist.  Eyes:     Comments: Pupils are equal  Cardiovascular:     Rate and Rhythm: Tachycardia present. Rhythm irregular.     Heart sounds: Normal heart sounds. No murmur.  Pulmonary:     Effort: Pulmonary effort is normal. No respiratory distress.     Breath sounds: Normal breath sounds. No stridor. No wheezing, rhonchi or rales.  Chest:     Chest wall: No tenderness.  Abdominal:     General: Abdomen is flat. A surgical scar is present. Bowel sounds are normal. There is no distension.     Palpations: There is no shifting dullness, hepatomegaly, splenomegaly or mass.     Tenderness: There is generalized abdominal tenderness (complains of pain both upper and lower abdomen going to her back).     Hernia: No hernia is present.     Comments: Midline fairly well healed know from suture is visible in the midline incision. Ostomy looks fine, covered with formed stool, and is working well.   IR drain site dressed, and some red-brownish colored drainage coming from the site.  Minimal cellulitis around the site. Picture below  Skin:    General: Skin is warm and dry.  Neurological:     General: No focal deficit present.     Mental Status: She is alert and oriented to person, place, and time.     Cranial Nerves: No cranial nerve deficit.  Psychiatric:        Mood and Affect: Mood is depressed (she has a hx of depression but  seem pretty upbeat based on her hx). Mood is not anxious.     Results for orders placed or  performed during the hospital encounter of 03/10/19 (from the past 48 hour(s))  Urinalysis, Routine w reflex microscopic     Status: Abnormal   Collection Time: 03/10/19 12:41 AM  Result Value Ref Range   Color, Urine AMBER (A) YELLOW    Comment: BIOCHEMICALS MAY BE AFFECTED BY COLOR   APPearance CLEAR CLEAR   Specific Gravity, Urine 1.032 (H) 1.005 - 1.030   pH 5.0 5.0 - 8.0   Glucose, UA NEGATIVE NEGATIVE mg/dL   Hgb urine dipstick SMALL (A) NEGATIVE   Bilirubin Urine NEGATIVE NEGATIVE   Ketones, ur NEGATIVE NEGATIVE mg/dL   Protein, ur 30 (A) NEGATIVE mg/dL   Nitrite POSITIVE (A) NEGATIVE   Leukocytes,Ua MODERATE (A) NEGATIVE   RBC / HPF 0-5 0 - 5 RBC/hpf   WBC, UA 21-50 0 - 5 WBC/hpf   Bacteria, UA NONE SEEN NONE SEEN   Squamous Epithelial / LPF 0-5 0 - 5   Mucus PRESENT    Hyaline Casts, UA PRESENT     Comment: Performed at [facility], [address], [name], [place] [address]  Lactic acid, plasma     Status: Abnormal   Collection Time: 03/10/19  8:58 PM  Result Value Ref Range   Lactic Acid, Venous 2.5 (HH) 0.5 - 1.9 mmol/L    Comment: CRITICAL RESULT CALLED TO, READ BACK BY AND VERIFIED WITH: Ferd Glassing [facility] 03/10/19 2205 WAYK Performed at [facility], [place] [address], [place], [place] [address]   Comprehensive metabolic panel     Status: Abnormal   Collection Time: 03/10/19  8:58 PM  Result Value Ref Range   Sodium 141 135 - 145 mmol/L   Potassium 3.1 (L) 3.5 - 5.1 mmol/L   Chloride 102 98 - 111 mmol/L   CO2 31 22 - 32 mmol/L   Glucose, Bld 174 (H) 70 - 99 mg/dL   BUN 20 8 - 23 mg/dL   Creatinine, Ser 1.09 (H) 0.44 - 1.00 mg/dL   Calcium 8.7 (L) 8.9 - 10.3 mg/dL   Total Protein 6.0 (L) 6.5 - 8.1 g/dL   Albumin 2.5 (L) 3.5 - 5.0 g/dL   AST 237 (H) 15 - 41 U/L   ALT 95 (H) 0 - 44 U/L   Alkaline Phosphatase 1,377 (H) 38 - 126 U/L   Total Bilirubin 2.3 (H)  0.3 - 1.2 mg/dL   GFR calc non Af Amer 51 (L) >60 mL/min   GFR calc Af Amer 59 (L) >60 mL/min   Anion gap 8 5 - 15    Comment: Performed at [facility], [place] [address] [place], [place] [address]  CBC WITH DIFFERENTIAL     Status: Abnormal   Collection Time: 03/10/19  8:58 PM  Result Value Ref Range   WBC 12.7 (H) 4.0 - 10.5 K/uL   RBC 4.20 3.87 - 5.11 MIL/uL   Hemoglobin 11.1 (L) 12.0 - 15.0 g/dL   HCT 38.6 36.0 - 46.0 %   MCV 91.9 80.0 - 100.0 fL   MCH 26.4 26.0 - 34.0 pg   MCHC 28.8 (L) 30.0 - 36.0 g/dL   RDW 17.9 (H) 11.5 - 15.5 %   Platelets 359 150 - 400 K/uL   nRBC 0.0 0.0 - 0.2 %   Neutrophils Relative % 93 %   Neutro Abs 11.8 (H) 1.7 - 7.7 K/uL   Lymphocytes Relative 2 %   Lymphs Abs 0.2 (L) 0.7 - 4.0 K/uL   Monocytes Relative 5 %  Monocytes Absolute 0.6 0.1 - 1.0 K/uL   Eosinophils Relative 0 %   Eosinophils Absolute 0.1 0.0 - 0.5 K/uL   Basophils Relative 0 %   Basophils Absolute 0.0 0.0 - 0.1 K/uL   Immature Granulocytes 0 %   Abs Immature Granulocytes 0.05 0.00 - 0.07 K/uL    Comment: Performed at [facility], [place] [address] [place], [place] [address]  Brain natriuretic peptide     Status: Abnormal   Collection Time: 03/10/19  8:58 PM  Result Value Ref Range   B Natriuretic Peptide 359.3 (H) 0.0 - 100.0 pg/mL    Comment: Performed at [facility] [address] [place], [place] [address]  Lipase, blood     Status: None   Collection Time: 03/10/19  8:58 PM  Result Value Ref Range   Lipase 38 11 - 51 U/L    Comment: Performed at [facility] [address], [place], [place] [address]  Procalcitonin     Status: None   Collection Time: 03/10/19  8:58 PM  Result Value Ref Range   Procalcitonin 4.38 ng/mL    Comment:        Interpretation: PCT > 2 ng/mL: Systemic infection (sepsis) is likely, unless other causes are known. (NOTE)       Sepsis PCT Algorithm           Lower Respiratory Tract                                       Infection PCT Algorithm    ----------------------------     ----------------------------         PCT < 0.25 ng/mL                PCT < 0.10 ng/mL         Strongly encourage             Strongly discourage   discontinuation of antibiotics    initiation of antibiotics    ----------------------------     -----------------------------       PCT 0.25 - 0.50 ng/mL            PCT 0.10 - 0.25 ng/mL               OR       >80% decrease in PCT            Discourage initiation of                                            antibiotics      Encourage discontinuation           of antibiotics    ----------------------------     -----------------------------         PCT >= 0.50 ng/mL              PCT 0.26 - 0.50 ng/mL               AND       <80% decrease in PCT              Encourage initiation of  antibiotics       Encourage continuation           of antibiotics    ----------------------------     -----------------------------        PCT >= 0.50 ng/mL                  PCT > 0.50 ng/mL               AND         increase in PCT                  Strongly encourage                                      initiation of antibiotics    Strongly encourage escalation           of antibiotics                                     -----------------------------                                           PCT <= 0.25 ng/mL                                                 OR                                        > 80% decrease in PCT                                     Discontinue / Do not initiate                                             antibiotics Performed at [facility], [address] [place], [place] [address]   Troponin I - ONCE - STAT     Status: Abnormal   Collection Time: 03/10/19  8:58 PM  Result Value Ref Range   Troponin I 0.08 (HH) <0.03 ng/mL    Comment: CRITICAL RESULT CALLED TO, READ BACK BY AND VERIFIED WITH: Ferd Glassing [facility] 03/10/19 2227  WAYK Performed at [facility], [place] [address], [place], [place] [address]   Blood Culture (routine x 2)     Status: None (Preliminary result)   Collection Time: 03/10/19  9:37 PM  Result Value Ref Range   Specimen Description BLOOD RIGHT HAND    Special Requests      BOTTLES DRAWN AEROBIC AND ANAEROBIC Blood Culture results may not be optimal due to an inadequate volume of blood received in culture bottles   Culture  Setup Time      GRAM NEGATIVE RODS ANAEROBIC BOTTLE ONLY Organism ID to follow Performed at [facility], [place] [address] [place], [place] [address]    Culture GRAM NEGATIVE RODS    Report Status PENDING   Blood Culture (routine x 2)     Status: None (Preliminary result)   Collection Time: 03/10/19  9:50 PM  Result Value Ref Range   Specimen Description BLOOD LEFT ARM    Special Requests      BOTTLES DRAWN AEROBIC AND ANAEROBIC Blood Culture adequate volume   Culture  Setup Time      GRAM NEGATIVE RODS ANAEROBIC BOTTLE ONLY IDENTIFICATION TO FOLLOW Performed at [facility], [place] [address] [place], [place] [address]    Culture GRAM NEGATIVE RODS    Report Status PENDING   POCT I-Stat EG7     Status: Abnormal   Collection Time: 03/10/19 10:00 PM  Result Value Ref Range   pH, Ven 7.431 (H) 7.250 - 7.430   pCO2, Ven 40.4 (L) 44.0 - 60.0 mmHg   pO2, Ven 61.0 (H) 32.0 - 45.0 mmHg   Bicarbonate 26.9 20.0 - 28.0 mmol/L   TCO2 28 22 - 32 mmol/L   O2 Saturation 92.0 %   Acid-Base Excess 2.0 0.0 - 2.0 mmol/L   Sodium 142 135 - 145 mmol/L   Potassium 3.2 (L) 3.5 - 5.1 mmol/L   Calcium, Ion 1.16 1.15 - 1.40 mmol/L   HCT 36.0 36.0 - 46.0 %   Hemoglobin 12.2 12.0 - 15.0 g/dL   Patient temperature HIDE    Sample type VENOUS   Lactic acid, plasma     Status: Abnormal   Collection Time: 03/11/19 12:49 AM  Result Value Ref Range   Lactic Acid, Venous 3.3 (HH) 0.5 - 1.9 mmol/L    Comment: CRITICAL RESULT CALLED TO, READ BACK BY AND VERIFIED WITH:  Ferd Glassing [facility] [address] 03/11/19 0131 WAYK Performed at [facility], [place] [address], [place], [place] [address]   Magnesium     Status: Abnormal   Collection Time: 03/11/19  2:38 AM  Result Value Ref Range   Magnesium 1.1 (L) 1.7 - 2.4 mg/dL    Comment: Performed at [place] [address], [place], [place] [address]  Hemoglobin A1c     Status: Abnormal   Collection Time: 03/11/19  2:38 AM  Result Value Ref Range   Hgb A1c MFr Bld 6.8 (H) 4.8 - 5.6 %    Comment: (NOTE) Pre diabetes:          5.7%-6.4% Diabetes:              >6.4% Glycemic control for   <7.0% adults with diabetes    Mean Plasma Glucose 148.46 mg/dL    Comment: Performed at [facility] [address] [place], [place] [address]  Lipid panel     Status: Abnormal   Collection Time: 03/11/19  2:38 AM  Result Value Ref Range   Cholesterol 91 0 - 200 mg/dL   Triglycerides 78 <150 mg/dL   HDL 23 (L) >40 mg/dL   Total CHOL/HDL Ratio 4.0 RATIO   VLDL 16 0 - 40 mg/dL   LDL Cholesterol 52 0 - 99 mg/dL    Comment:        Total Cholesterol/HDL:CHD Risk Coronary Heart Disease Risk Table                     Men   Women  1/2 Average Risk   3.4   3.3  Average Risk  5.0   4.4  2 X Average Risk   9.6   7.1  3 X Average Risk  23.4   11.0        Use the calculated Patient Ratio above and the CHD Risk Table to determine the patient's CHD Risk.        ATP III CLASSIFICATION (LDL):  <100     mg/dL   Optimal  100-129  mg/dL   Near or Above                    Optimal  130-159  mg/dL   Borderline  160-189  mg/dL   High  >190     mg/dL   Very High Performed at [place] [address], [place], [place] [address]   Troponin I - Now Then Q6H     Status: Abnormal   Collection Time: 03/11/19  2:38 AM  Result Value Ref Range   Troponin I 0.06 (HH) <0.03 ng/mL    Comment: CRITICAL VALUE NOTED.  VALUE IS CONSISTENT WITH PREVIOUSLY REPORTED AND CALLED VALUE. Performed at [facility],  [place] [address] [place], [place] [address]   Protime-INR     Status: Abnormal   Collection Time: 03/11/19  2:38 AM  Result Value Ref Range   Prothrombin Time 22.8 (H) 11.4 - 15.2 seconds   INR 2.1 (H) 0.8 - 1.2    Comment: (NOTE) INR goal varies based on device and disease states. Performed at [facility], [place] [address] [place], [place] [address]   APTT     Status: None   Collection Time: 03/11/19  2:38 AM  Result Value Ref Range   aPTT 35 24 - 36 seconds    Comment: Performed at [facility] [address], [place], [place] [address]  Glucose, capillary     Status: None   Collection Time: 03/11/19  3:21 AM  Result Value Ref Range   Glucose-Capillary 86 70 - 99 mg/dL  MRSA PCR Screening     Status: None   Collection Time: 03/11/19  3:47 AM  Result Value Ref Range   MRSA by PCR NEGATIVE NEGATIVE    Comment:        The GeneXpert MRSA Assay (FDA approved for NASAL specimens only), is one component of a comprehensive MRSA colonization surveillance program. It is not intended to diagnose MRSA infection nor to guide or monitor treatment for MRSA infections. Performed at [facility], [place] [address], [place], [place] [address]   Troponin I - Now Then Q6H     Status: Abnormal   Collection Time: 03/11/19  5:57 AM  Result Value Ref Range   Troponin I 0.05 (HH) <0.03 ng/mL    Comment: CRITICAL VALUE NOTED.  VALUE IS CONSISTENT WITH PREVIOUSLY REPORTED AND CALLED VALUE. Performed at [facility], [place] [address] [place], [place] [address]   Basic metabolic panel     Status: Abnormal   Collection Time: 03/11/19  5:57 AM  Result Value Ref Range   Sodium 141 135 - 145 mmol/L   Potassium 3.7 3.5 - 5.1 mmol/L   Chloride 104 98 - 111 mmol/L   CO2 27 22 - 32 mmol/L   Glucose, Bld 114 (H) 70 - 99 mg/dL   BUN 20 8 - 23 mg/dL   Creatinine, Ser 0.83 0.44 - 1.00 mg/dL   Calcium 8.0 (L) 8.9 - 10.3 mg/dL   GFR calc non  Af Amer >60 >60 mL/min   GFR calc Af Amer >60  >60 mL/min   Anion gap 10 5 - 15    Comment: Performed at [facility] [address], [place], [place] [address]  CBC     Status: Abnormal   Collection Time: 03/11/19  5:57 AM  Result Value Ref Range   WBC 15.2 (H) 4.0 - 10.5 K/uL   RBC 3.27 (L) 3.87 - 5.11 MIL/uL   Hemoglobin 8.7 (L) 12.0 - 15.0 g/dL   HCT 30.9 (L) 36.0 - 46.0 %   MCV 94.5 80.0 - 100.0 fL   MCH 26.6 26.0 - 34.0 pg   MCHC 28.2 (L) 30.0 - 36.0 g/dL   RDW 18.3 (H) 11.5 - 15.5 %   Platelets 299 150 - 400 K/uL   nRBC 0.0 0.0 - 0.2 %    Comment: Performed at [facility], [place] [address] [place], [place] [address]  Phosphorus     Status: None   Collection Time: 03/11/19  5:57 AM  Result Value Ref Range   Phosphorus 4.0 2.5 - 4.6 mg/dL    Comment: Performed at [facility] [address], [place], [place] [address]  Glucose, capillary     Status: Abnormal   Collection Time: 03/11/19  7:38 AM  Result Value Ref Range   Glucose-Capillary 110 (H) 70 - 99 mg/dL   Ct Abdomen Pelvis W Contrast  Result Date: 03/11/2019 CLINICAL DATA:  Follow-up abdominal abscess. Previous sigmoid colectomy for colon carcinoma. Undergoing chemotherapy. EXAM: CT ABDOMEN AND PELVIS WITH CONTRAST TECHNIQUE: Multidetector CT imaging of the abdomen and pelvis was performed using the standard protocol following bolus administration of intravenous contrast. CONTRAST:  128m OMNIPAQUE IOHEXOL 300 MG/ML  SOLN COMPARISON:  03/05/2019 FINDINGS: Lower Chest: Small to moderate left pleural effusion and left lower lobe atelectasis show no significant change. Hepatobiliary: 3.9 cm hypovascular mass involving the caudate lobe of the liver shows no significant change compared to prior study. No new or enlarging liver masses are identified. Gallbladder is unremarkable. No evidence of biliary ductal dilatation. Pancreas:  No mass or inflammatory changes. Spleen: No evidence of splenomegaly. A few tiny sub-cm low-attenuation lesions in the spleen are  stable. Adrenals/Urinary Tract: Stable 3.3 cm left adrenal mass, previously shown to represent an adenoma by MRI. Stable mild left renal atrophy. No evidence of renal masses or hydronephrosis. Unremarkable unopacified urinary bladder. Stomach/Bowel: Postop changes again seen from previous left hemicolectomy with transverse colostomy. A large gas and fluid collection is again seen in the upper abdomen anterior to the stomach. This measures 13.2 x 9.7 cm, without significant change in size compared to previous study. Previously seen left abdominal percutaneous drainage catheter has been removed. A persistent fluid collection in the left lower abdomen and extending into the central pelvis is seen at this location. This collection measures 20.2 x 3.9 cm, with mildly increased in size from 19.4 x 2.3 cm previously. No new fluid collections are identified. No evidence of bowel obstruction no evidence of free intraperitoneal air. Vascular/Lymphatic: No pathologically enlarged lymph nodes. No abdominal aortic aneurysm. Reproductive:  No mass or other significant abnormality. Other:  None. Musculoskeletal:  No suspicious bone lesions identified. IMPRESSION: 1. Persistent large gas and fluid collection in upper abdomen anterior to the stomach, without significant change. 2. Mild increase in size of large fluid collection extending across the left lower abdomen and central pelvis, following removal of percutaneous drainage catheter. 3. Caudate lobe liver  metastasis, without significant interval change. No new or progressive metastatic disease identified. 4. Stable left pleural effusion and left lower lobe atelectasis. Electronically Signed   By: Earle Gell M.D.   On: 03/11/2019 00:10   Dg Chest Port 1 View  Result Date: 03/10/2019 CLINICAL DATA:  Initial evaluation for acute fever. EXAM: PORTABLE CHEST 1 VIEW COMPARISON:  Prior radiograph from 02/05/2019 FINDINGS: Right-sided Port-A-Cath in place, stable. Cardiomegaly,  unchanged. Mediastinal silhouette normal. Aortic atherosclerosis. Persistent small to moderate left pleural effusion. Associated left basilar opacity could reflect atelectasis or infiltrate. Underlying mild diffuse pulmonary interstitial edema. No pneumothorax. No acute osseous finding. IMPRESSION: 1. Persistent small to moderate left pleural effusion with associated left basilar opacity, which may reflect atelectasis or infiltrate. 2. Cardiomegaly with underlying mild diffuse pulmonary interstitial edema. 3. Aortic atherosclerosis. Electronically Signed   By: Jeannine Boga M.D.   On: 03/10/2019 22:03   Specimen Description ABDOMEN   01/31/19  Last drain aspirate   Special Requests NONE   Gram Stain ABUNDANT WBC PRESENT,BOTH PMN AND MONONUCLEAR  MODERATE GRAM POSITIVE COCCI  FEW GRAM VARIABLE ROD   Culture FEW ENTEROCOCCUS FAECALIS  ABUNDANT PREVOTELLA BIVIA  BETA LACTAMASE POSITIVE  Performed at [facility], [place] [address] [place], [place] [address]   Report Status 02/04/2019 FINAL   Organism ID, Bacteria ENTEROCOCCUS FAECALIS   Resulting Agency [facility]  Susceptibility    Enterococcus faecalis    MIC    AMPICILLIN <=2 SENSITIVE  Sensitive    GENTAMICIN SYNERGY SENSITIVE  Sensitive    VANCOMYCIN 1 SENSITIVE  Sensitive      . sodium chloride 75 mL/hr at 03/11/19 0150  . sodium chloride Stopped (03/11/19 0444)  . ceFEPime (MAXIPIME) IV 2 g (03/11/19 0939)  . magnesium sulfate 1 - 4 g bolus IVPB 4 g (03/11/19 1044)  . metronidazole 500 mg (03/11/19 0823)  . phenylephrine (NEO-SYNEPHRINE) Adult infusion         Assessment/Plan Atrial fibrillation with rapid ventricular rate -on Eliquis  Chronic kidney disease stage III Type 2 diabetes -controlled Essential hypertension Depression Hyperlipidemia   Sepsis secondary to intra-abdominal abscess. Adenocarcinoma of the colon with chemotherapy; complete obstruction; liver metastasis. S/p laparotomy with mobilization  of the splenic flexure and resection of the sigmoid, descending and distal transverse colon with a Hartman's pouch at the end of the colostomy in the right upper quadrant, 12/27/2018 Dr. [name]. Intra-abdominal abscess 01/31/19 IR drain 01/31/19  FEN:  NPO?IV fluids ID:  Cefepime 4/27 >> day 2  Flagyl 4/27 >> day 1 DVT:  SCD's; no Lovenox waiting for IR drain Follow up:  Dr. [name] POC:  [place], [name] [facility] [phone]   Plan:  Agree with current Rx.  IR consult is in.  ABX listed above along with last culture.  We will follow with you.    Earnstine Regal [facility] 03/11/2019, 9:44 AM Pager: [phone] Consults: [phone]

## 2019-03-11 NOTE — ED Notes (Signed)
Resident aware of pt's hypotension. Will continue to monitor.

## 2019-03-11 NOTE — Progress Notes (Signed)
[facility] Progress Note Patient Name: RITTA HAMMES DOB: 09-23-1946 MRN: [id number]   Date of Service  03/11/2019  HPI/Events of Note  Hypokalemia / Hypomagnesemia - K+ = 3.2, Mg++ = 1.1 and Creatinine = 1.09  eICU Interventions  Will replace K+ and Mg++.      Intervention Category Major Interventions: Electrolyte abnormality - evaluation and management  Adelynn Gipe [place] 03/11/2019, 4:18 AM

## 2019-03-11 NOTE — Progress Notes (Signed)
Patient ID: Cassandra Allen, female   DOB: [birth year], 73 y.o.   MRN: [id number] Asked to review CT in contemplation of possible drainage. 73 y.o female h/o met colon CA s/p partial colectomy with p op 24.6cm abscess s/p perc drain placement 01/31/19. Previous  F/u CT 03/05/19 showed partial decrease in size to 19.4cm. Today presents to the ED septic. Repeat CT shows interval removal of LLQ drain with slight increase in size of complex collection now 20.2cm. I don't see documentation in EPic regarding who removed the drain and why; perhaps it was inadvertently dislodged at the SNF? There is also a persistent complex gas/fluid/fat attenutation LUQ 13cm collection which persists on all scans. Both collections are percutaneously approachable.  The  LUQ process has an  atypical CT appearance for simple abscess with the large fat component.  The LLQ had inadequate resolution after prior attempted tube drainage for greater than 1 month, and may require alternative approach e.g. surgical evacuation for more durable response given previous limited response to drainage for 1 month.   I would recommend review of the clinical scenario and series of scans by gen surg to solicit their input on the poor response of the dominant LLQ collection to tube drainage thus far. Additionally, She is on eliquis which increases bleeding risk, and typically would be held 2 days before routine deep abscess drain placement.  We can tentatively schedule urgent CT guided replacement of the LLQ in the AM, pending surgical input re alternative options and bleeding risk.  Please keep patient NPO and hold  anticoagulation until that time.

## 2019-03-11 NOTE — H&P (Signed)
History and Physical    RUT BETTERTON YQI:[id number] DOB: 12/28/1945 DOA: 03/10/2019  Referring MD/NP/PA:   PCP: [name], MD   Patient coming from:  The patient is coming from SNF.  At baseline, pt is dependent for most of ADL.        Chief Complaint: Abdominal pain  HPI: Cassandra Allen is a 73 y.o. female with medical history significant of hypertension, hyperlipidemia, diabetes mellitus, dCHF, A fib on Eliquis, metastasized colon cancer (s/p of partial colectomy, chemotherapy), s/p of colostomy, CKD stage III, depression, who presents with abdominal pain.  Patient was recently hospitalized from 3/20-3/29 due to post operation abdominal abscess. She had perc drain placement 01/31/19. Pt was also discharged to nursing home on Augmentin for 7 days.  Patient states that she developed worsening abdominal pain today, which is diffuse, worse on upper abdomen, constant, 10 out of 10 in severity, sharp, radiating to the back.  It is associated with a fever and chills.  She also has nausea vomiting several times.  No diarrhea. Patient denies chest pain, cough, shortness of breath.  No symptoms of UTI or unilateral weakness.   ED Course: pt was found to have WBC 12.7, lactic acid 2.5, troponin 0.08, BNP 359.3, procalcitonin 4.38, potassium 3.2, renal function close to baseline, temperature 100.7, oxygen saturation 90 to 99% on room air, respiration rate 30s. Previous CT 03/05/19 showed partial decrease in size to 19.4cm.  Repeat CT shows interval removal of LLQ drain with slight increase in size of complex collection now 20.2 cm.  There is also a persistent complex gas/fluid/fat attenutation LUQ 13 cm collection which persists persists on all scans. Pt is admitted to SDU as inpt. IR, Dr. Jarvis Newcomer was consulted by EDP.  Pt has A fib with RVR with HR upto 170s in ED. Cardizem gtt was started in ED, HR improved to 130s, but Blood pressure dropped to SBP 84. Pt received 2L of Ringer solution  in  ED. cardizem gtt was d/c'ed. And another 500 cc of NS will be given. PCCM was consulted. Dr. Oletta Darter recommended to start phenylephrine IV.  Chest x-ray showed: 1. Persistent small to moderate left pleural effusion with associated left basilar opacity. 2. Cardiomegaly with underlying mild diffuse pulmonary interstitial edema. 3. Aortic atherosclerosis.  Review of Systems:   General: has fevers, chills, no body weight gain, has poor appetite, has fatigue HEENT: no blurry vision, hearing changes or sore throat Respiratory: no dyspnea, coughing, wheezing CV: no chest pain, no palpitations GI: has nausea, vomiting, abdominal pain, no diarrhea, constipation GU: no dysuria, burning on urination, increased urinary frequency, hematuria  Ext: no leg edema Neuro: no unilateral weakness, numbness, or tingling, no vision change or hearing loss Skin: no rash, no skin tear. MSK: No muscle spasm, no deformity, no limitation of range of movement in spin Heme: No easy bruising.  Travel history: No recent long distant travel.  Allergy:  Allergies  Allergen Reactions   Metformin And Related Nausea And Vomiting   Prednisone Nausea And Vomiting    Past Medical History:  Diagnosis Date   Acute diastolic heart failure ([place]) 12/22/2018   Arthritis    Atrial fibrillation, chronic 12/22/2018   Cancer of left colon ([place]) 10/30/2018   Cancer of sigmoid colon  12/27/2018   Diabetes mellitus without complication ([place])    Hypertension    Obesity (BMI 30-39.9) 12/27/2018    Past Surgical History:  Procedure Laterality Date   COLONOSCOPY  10/2018   Dr [name].  Large cancer at splenic flexure,  Bulky sigmoid colon mass, Numerous polyps   Intra-abdominal abscess drainage  01/31/2019   Abscess drainage grew Enterococcus faecalis   IR IMAGING GUIDED PORT INSERTION  11/20/2018   LAPAROTOMY N/A 12/27/2018   Procedure: LEFT SIGMOID COLECTOMY WITH HARTMANN POUCH AND END COLOSTOMY;  Surgeon: [name], MD;  Location: [facility];  Service: General;  Laterality: N/A;    Social History:  reports that she has never smoked. She has never used smokeless tobacco. She reports that she does not drink alcohol or use drugs.  Family History:  Family History  Problem Relation Age of Onset   Heart attack Mother    Heart attack Father      Prior to Admission medications   Medication Sig Start Date End Date Taking? Authorizing Provider  acetaminophen (TYLENOL) 500 MG tablet Take 500 mg by mouth every 4 (four) hours as needed. FOR PAIN OR TEMP.    [provider]  apixaban (ELIQUIS) 5 MG TABS tablet TAKE 1 TABLET (5 MG TOTAL) BY MOUTH 2 (TWO) TIMES DAILY. 09/19/18   [name], NP  bisacodyl (DULCOLAX) 10 MG suppository Place 10 mg rectally as needed for moderate constipation.    [provider]  dicyclomine (BENTYL) 10 MG capsule Take 10 mg by mouth. TAKE 1 CAPSULE BY MOUTH THREE TIMES A DAY (DO NOT CRUSH)    [provider]  diltiazem (CARDIZEM CD) 240 MG 24 hr capsule Take 1 capsule (240 mg total) by mouth daily. 02/09/19   Regalado, Belkys A, MD  feeding supplement, ENSURE ENLIVE, (ENSURE ENLIVE) LIQD Take 1 Bottle by mouth daily.    [provider]  ferrous sulfate (KP FERROUS SULFATE) 325 (65 FE) MG tablet Take 325 mg by mouth daily with breakfast.    [provider]  furosemide (LASIX) 20 MG tablet Give one by mouth daily as needed for wt gain of 3 lbs in 1 day or 5 lbs in 1 week    [provider]  Insulin NPH, Human,, Isophane, (HUMULIN N KWIKPEN) 100 UNIT/ML Kiwkpen INJECT 12 UNITS SUBCUTANEOUSLY BEFORE BREAKFAST AND EVENING MEALS    [provider]  magnesium hydroxide (MILK OF MAGNESIA) 400 MG/5ML suspension Take 30 mLs by mouth daily as needed for mild constipation.    [provider]  NON FORMULARY NSA Med Pass 120 ml po daily    [provider]  ondansetron (ZOFRAN) 8 MG tablet Take 8 mg by mouth  every 8 (eight) hours as needed for nausea.    [provider]  oxybutynin (DITROPAN-XL) 10 MG 24 hr tablet Take 10 mg by mouth daily. 12/16/18   [provider]  sertraline (ZOLOFT) 100 MG tablet TAKE 1 TABLET BY MOUTH ONCE DAILY (TAKE WITH 25MG  TO=125MG ) FOR DEPRESSION    [provider]  sertraline (ZOLOFT) 25 MG tablet TAKE 1 TABLET BY MOUTH ONCE DAILY (TAKE WITH 100MG  TO=125MG ) FOR DEPRESSION    [provider]  simethicone (INFANTS GAS RELIEF) 40 MG/0.6ML drops Take 40 mg by mouth 4 (four) times daily as needed for flatulence.    [provider]  simvastatin (ZOCOR) 20 MG tablet Take 20 mg by mouth at bedtime.    [provider]  traZODone (DESYREL) 50 MG tablet Take 50 mg by mouth at bedtime.     [provider]    Physical Exam: Vitals:   03/11/19 0145 03/11/19 0200 03/11/19 0215 03/11/19 0230  BP: (!) 96/53 (!) 91/56 92/62  92/63  Pulse: (!) 125 97 87 (!) 122  Resp: (!) 27 (!) 22 (!) 27 (!) 28  Temp:      TempSrc:      SpO2: 98% 97% 99% 99%  Weight:      Height:       General: Not in acute distress HEENT:       Eyes: PERRL, EOMI, no scleral icterus.       ENT: No discharge from the ears and nose, no pharynx injection, no tonsillar enlargement.        Neck: No JVD, no bruit, no mass felt. Heme: No neck lymph node enlargement. Cardiac: S1/S2, RRR, No murmurs, No gallops or rubs. Respiratory:  No rales, wheezing, rhonchi or rubs. GI: Soft, nondistended, diffused tenderness, no rebound pain, no organomegaly, BS present. Colostomy bag is in place with normal-appearing stool. Left lower quadrant incision site has a little drainage.  Small amount of erythema surrounding the left lower quadrant wound.  GU: No hematuria Ext: No pitting leg edema bilaterally. 2+DP/PT pulse bilaterally. Musculoskeletal: No joint deformities, No joint redness or warmth, no limitation of ROM in spin. Skin: No rashes.  Neuro: Alert, oriented X3,  cranial nerves II-XII grossly intact, moves all extremities normally.  Psych: Patient is not psychotic, no suicidal or hemocidal ideation.  Labs on Admission: I have personally reviewed following labs and imaging studies  CBC: Recent Labs  Lab 03/10/19 2058 03/10/19 2200  WBC 12.7*  --   NEUTROABS 11.8*  --   HGB 11.1* 12.2  HCT 38.6 36.0  MCV 91.9  --   PLT 359  --    Basic Metabolic Panel: Recent Labs  Lab 03/10/19 2058 03/10/19 2200  NA 141 142  K 3.1* 3.2*  CL 102  --   CO2 31  --   GLUCOSE 174*  --   BUN 20  --   CREATININE 1.09*  --   CALCIUM 8.7*  --    GFR: Estimated Creatinine Clearance: 43.5 mL/min (A) (by C-G formula based on SCr of 1.09 mg/dL (H)). Liver Function Tests: Recent Labs  Lab 03/10/19 2058  AST 237*  ALT 95*  ALKPHOS 1,377*  BILITOT 2.3*  PROT 6.0*  ALBUMIN 2.5*   Recent Labs  Lab 03/10/19 2058  LIPASE 38   No results for input(s): AMMONIA in the last 168 hours. Coagulation Profile: No results for input(s): INR, PROTIME in the last 168 hours. Cardiac Enzymes: Recent Labs  Lab 03/10/19 2058  TROPONINI 0.08*   BNP (last 3 results) No results for input(s): PROBNP in the last 8760 hours. HbA1C: No results for input(s): HGBA1C in the last 72 hours. CBG: No results for input(s): GLUCAP in the last 168 hours. Lipid Profile: No results for input(s): CHOL, HDL, LDLCALC, TRIG, CHOLHDL, LDLDIRECT in the last 72 hours. Thyroid Function Tests: No results for input(s): TSH, T4TOTAL, FREET4, T3FREE, THYROIDAB in the last 72 hours. Anemia Panel: No results for input(s): VITAMINB12, FOLATE, FERRITIN, TIBC, IRON, RETICCTPCT in the last 72 hours. Urine analysis:    Component Value Date/Time   COLORURINE AMBER (A) 03/10/2019 0041   APPEARANCEUR CLEAR 03/10/2019 0041   LABSPEC 1.032 (H) 03/10/2019 0041   PHURINE 5.0 03/10/2019 0041   GLUCOSEU NEGATIVE 03/10/2019 0041   HGBUR SMALL (A) 03/10/2019 0041   BILIRUBINUR NEGATIVE 03/10/2019  0041   KETONESUR NEGATIVE 03/10/2019 0041   PROTEINUR 30 (A) 03/10/2019 0041   UROBILINOGEN 0.2 03/25/2015 2317   NITRITE POSITIVE (A) 03/10/2019 0041  LEUKOCYTESUR MODERATE (A) 03/10/2019 0041   Sepsis Labs: @LABRCNTIP (procalcitonin:4,lacticidven:4) )No results found for this or any previous visit (from the past 240 hour(s)).   Radiological Exams on Admission: Ct Abdomen Pelvis W Contrast  Result Date: 03/11/2019 CLINICAL DATA:  Follow-up abdominal abscess. Previous sigmoid colectomy for colon carcinoma. Undergoing chemotherapy. EXAM: CT ABDOMEN AND PELVIS WITH CONTRAST TECHNIQUE: Multidetector CT imaging of the abdomen and pelvis was performed using the standard protocol following bolus administration of intravenous contrast. CONTRAST:  135mL OMNIPAQUE IOHEXOL 300 MG/ML  SOLN COMPARISON:  03/05/2019 FINDINGS: Lower Chest: Small to moderate left pleural effusion and left lower lobe atelectasis show no significant change. Hepatobiliary: 3.9 cm hypovascular mass involving the caudate lobe of the liver shows no significant change compared to prior study. No new or enlarging liver masses are identified. Gallbladder is unremarkable. No evidence of biliary ductal dilatation. Pancreas:  No mass or inflammatory changes. Spleen: No evidence of splenomegaly. A few tiny sub-cm low-attenuation lesions in the spleen are stable. Adrenals/Urinary Tract: Stable 3.3 cm left adrenal mass, previously shown to represent an adenoma by MRI. Stable mild left renal atrophy. No evidence of renal masses or hydronephrosis. Unremarkable unopacified urinary bladder. Stomach/Bowel: Postop changes again seen from previous left hemicolectomy with transverse colostomy. A large gas and fluid collection is again seen in the upper abdomen anterior to the stomach. This measures 13.2 x 9.7 cm, without significant change in size compared to previous study. Previously seen left abdominal percutaneous drainage catheter has been removed. A  persistent fluid collection in the left lower abdomen and extending into the central pelvis is seen at this location. This collection measures 20.2 x 3.9 cm, with mildly increased in size from 19.4 x 2.3 cm previously. No new fluid collections are identified. No evidence of bowel obstruction no evidence of free intraperitoneal air. Vascular/Lymphatic: No pathologically enlarged lymph nodes. No abdominal aortic aneurysm. Reproductive:  No mass or other significant abnormality. Other:  None. Musculoskeletal:  No suspicious bone lesions identified. IMPRESSION: 1. Persistent large gas and fluid collection in upper abdomen anterior to the stomach, without significant change. 2. Mild increase in size of large fluid collection extending across the left lower abdomen and central pelvis, following removal of percutaneous drainage catheter. 3. Caudate lobe liver metastasis, without significant interval change. No new or progressive metastatic disease identified. 4. Stable left pleural effusion and left lower lobe atelectasis. Electronically Signed   By: Earle Gell M.D.   On: 03/11/2019 00:10   Dg Chest Port 1 View  Result Date: 03/10/2019 CLINICAL DATA:  Initial evaluation for acute fever. EXAM: PORTABLE CHEST 1 VIEW COMPARISON:  Prior radiograph from 02/05/2019 FINDINGS: Right-sided Port-A-Cath in place, stable. Cardiomegaly, unchanged. Mediastinal silhouette normal. Aortic atherosclerosis. Persistent small to moderate left pleural effusion. Associated left basilar opacity could reflect atelectasis or infiltrate. Underlying mild diffuse pulmonary interstitial edema. No pneumothorax. No acute osseous finding. IMPRESSION: 1. Persistent small to moderate left pleural effusion with associated left basilar opacity, which may reflect atelectasis or infiltrate. 2. Cardiomegaly with underlying mild diffuse pulmonary interstitial edema. 3. Aortic atherosclerosis. Electronically Signed   By: Jeannine Boga M.D.   On:  03/10/2019 22:03     EKG: Independently reviewed.  Atrial fibrillation with RVR, QTC 458, low voltage, poor R wave progression, nonspecific T wave change.   Assessment/Plan Principal Problem:   Abscess of abdominal cavity (HCC) Active Problems:   Essential (primary) hypertension   HLD (hyperlipidemia)   Depression, recurrent (HCC)   Anticoagulant long-term use  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   CKD (chronic kidney disease) stage 3, GFR 30-59 ml/min (HCC)   Atrial fibrillation with RVR (HCC)   Chronic kidney disease (CKD), stage II (mild)   Sepsis (HCC)   Type II diabetes mellitus with renal manifestations (HCC)   Hypokalemia   Chronic diastolic CHF (congestive heart failure) (HCC)   Abnormal LFTs   Elevated troponin   Sepsis due to abscess of abdominal cavity [facility]): Patient meets critical for sepsis with leukocytosis, fever, tachycardia, hypotension, tachypnea.  Lactic acid is elevated.  Due to hypotension, new phenylephrine was recommended by Dr. Oletta Darter of PCCM.  Dr. Vernard Gambles of IR was consulted for possible drainage.  -will admit to SDU-->pt will be transferred to ICU -Antibiotics: Cefepime and Flagyl were started in ED, will continue. -f/u bx -will get Procalcitonin and trend lactic acid levels per sepsis protocol. -IVF: 2L of ringer solution and 500 cc of NS bolus, followed by 75 cc/h  -hold Eliquis   Essential (primary) hypertension: -hold bp med due to hypotension.  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 Annamaria Boots  CKD (chronic kidney disease) stage 3, GFR 30-59 ml/min ([place]): stable. cre 1.09 and BUN 20 -f/u with BMP  Atrial fibrillation with RVR ([place]): Pt has A fib with RVR with HR upto 170s in ED. Cardizem gtt was started in ED, HR improved to 130s, but Blood pressure dropped to SBP 84. Pt received 2L of Ringer solution in  ED. cardizem gtt was d/c'ed. And  another 500 cc of NS will be given. PCCM was consulted. Dr. Oletta Darter recommended to start phenylephrine IV. -d/c cardizem gtt -may need to start amiodarone -hold Eliquis for possible draining procedure  Type II diabetes mellitus with renal manifestations ([place]): Last A1c 5.6 on 08/09/18, well controled. Patient is taking NPH insulin at home -will decrease NPH insulin dose from 12 to 5 units bid -SSI  Hypokalemia: K=3.2  on admission. - Repleted - Check Mg level  Chronic diastolic CHF (congestive heart failure) ([place]): 2D echo on 12/23/2018 showed EF of 60-65%.  Patient's BNP is 359.  Currently no signs of acute exacerbation, but patient is at risk of developing fluid overload with ongoing fluid resuscitation. -Watch volume status closely - Hold Lasix due to sepsis.  Abnormal LFTs: Likely due to multifactorial etiology, including metastasized to disease and sepsis. -Control sepsis as above  Elevated troponin: Troponin 0.08, no chest pain.  Likely due to demand ischemia secondary to sepsis and atrial fibrillation with RVR. - cycle CE q6 x3 and repeat EKG in the am  - on zocor - Risk factor stratification: will check FLP and A1C   Inpatient status:  # Patient requires inpatient status due to high intensity of service, high risk for further deterioration and high frequency of surveillance required.  I certify that at the point of admission it is my clinical judgment that the patient will require inpatient hospital care spanning beyond 2 midnights from the point of admission.   This patient has multiple chronic comorbidities including hypertension, hyperlipidemia, diabetes mellitus, dCHF, A fib on Eliquis, metastasized colon cancer (s/p of partial colectomy, chemotherapy), s/p of colostomy, CKD stage III, depression    Now patient has presenting with sepsis due to abdominal abscess.  Patient also has A. fib with RVR, elevated troponin, hypokalemia  The worrisome physical exam findings  include abdominal tenderness.  The initial radiographic and laboratory data are worrisome  because of abdominal abscess, leukocytosis, sepsis, elevated troponin, hypokalemia, abnormal liver function.  Current medical needs: please see my assessment and plan  Predictability of an adverse outcome (risk): Patient has multiple comorbidities, now presents with sepsis due to abdominal abscess.  Patient developed hypotension.  Also has positive troponin and A. fib with RVR.  Her presentation is highly complicated.  At high risk of deteriorating.  Patient needs to be transferred to ICU.  Will need to be treated in hospital for at least 2 days       DVT ppx: SCD Code Status: Full code Family Communication: None at bed side.   Disposition Plan:  Anticipate discharge back to previous SNF Consults called:   IR, Dr. Jarvis Newcomer was consulted by EDp Admission status:   SDU/inpation       Date of Service 03/11/2019    [place] [facility]   If 7PM-7AM, please contact night-coverage [email or web address] Password [facility] 03/11/2019, 2:38 AM

## 2019-03-11 NOTE — Progress Notes (Signed)
PHARMACY - PHYSICIAN COMMUNICATION CRITICAL VALUE ALERT - BLOOD CULTURE IDENTIFICATION (BCID)  Cassandra Allen is an 73 y.o. 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 faecalis. Repeat CT demonstrates increase in size of fluid collection extending across L lower abdomen and central pelvis. IR plan drain replacement tomorrow AM. Admit BCx now 4/4 growing GNRs, BCID E.coli without KPC.  Current antibiotics: Cefepime and Flagyl  Changes to prescribed antibiotics recommended:  Change cefepime to ceftriaxone. May continue Flagyl for now given intra-abdominal source.  CCM RPh to discuss recommendations with treatment team.  Results for orders placed or performed during the hospital encounter of 03/10/19  Blood Culture ID Panel (Reflexed) (Collected: 03/10/2019  9:37 PM)  Result Value Ref Range   Enterococcus species NOT DETECTED NOT DETECTED   Listeria monocytogenes NOT DETECTED NOT DETECTED   Staphylococcus species NOT DETECTED NOT DETECTED   Staphylococcus aureus (BCID) NOT DETECTED NOT DETECTED   Streptococcus species NOT DETECTED NOT DETECTED   Streptococcus agalactiae NOT DETECTED NOT DETECTED   Streptococcus pneumoniae NOT DETECTED NOT DETECTED   Streptococcus pyogenes NOT DETECTED NOT DETECTED   Acinetobacter baumannii NOT DETECTED NOT DETECTED   Enterobacteriaceae species DETECTED (A) NOT DETECTED   Enterobacter cloacae complex NOT DETECTED NOT DETECTED   Escherichia coli DETECTED (A) NOT DETECTED   Klebsiella oxytoca NOT DETECTED NOT DETECTED   Klebsiella pneumoniae NOT DETECTED NOT DETECTED   Proteus species NOT DETECTED NOT DETECTED   Serratia marcescens NOT DETECTED NOT DETECTED   Carbapenem resistance NOT DETECTED NOT DETECTED   Haemophilus influenzae NOT DETECTED NOT  DETECTED   Neisseria meningitidis NOT DETECTED NOT DETECTED   Pseudomonas aeruginosa NOT DETECTED NOT DETECTED   Candida albicans NOT DETECTED NOT DETECTED   Candida glabrata NOT DETECTED NOT DETECTED   Candida krusei NOT DETECTED NOT DETECTED   Candida parapsilosis NOT DETECTED NOT DETECTED   Candida tropicalis NOT DETECTED NOT DETECTED   Josedaniel Haye N. Gerarda Fraction, PharmD, [place] PGY2 Infectious Diseases Pharmacy Resident Phone: [phone] 03/11/2019  11:09 AM

## 2019-03-12 DIAGNOSIS — Z87898 Personal history of other specified conditions: Secondary | ICD-10-CM

## 2019-03-12 DIAGNOSIS — L0291 Cutaneous abscess, unspecified: Secondary | ICD-10-CM

## 2019-03-12 DIAGNOSIS — N183 Chronic kidney disease, stage 3 (moderate): Secondary | ICD-10-CM

## 2019-03-12 DIAGNOSIS — E1121 Type 2 diabetes mellitus with diabetic nephropathy: Secondary | ICD-10-CM

## 2019-03-12 LAB — CBC
HCT: 33.2 % — ABNORMAL LOW (ref 36.0–46.0)
Hemoglobin: 9.4 g/dL — ABNORMAL LOW (ref 12.0–15.0)
MCH: 26.9 pg (ref 26.0–34.0)
MCHC: 28.3 g/dL — ABNORMAL LOW (ref 30.0–36.0)
MCV: 95.1 fL (ref 80.0–100.0)
Platelets: 278 10*3/uL (ref 150–400)
RBC: 3.49 MIL/uL — ABNORMAL LOW (ref 3.87–5.11)
RDW: 18.5 % — ABNORMAL HIGH (ref 11.5–15.5)
WBC: 10.6 10*3/uL — ABNORMAL HIGH (ref 4.0–10.5)
nRBC: 0.2 % (ref 0.0–0.2)

## 2019-03-12 LAB — BASIC METABOLIC PANEL
Anion gap: 9 (ref 5–15)
BUN: 19 mg/dL (ref 8–23)
CO2: 27 mmol/L (ref 22–32)
Calcium: 8.1 mg/dL — ABNORMAL LOW (ref 8.9–10.3)
Chloride: 104 mmol/L (ref 98–111)
Creatinine, Ser: 0.71 mg/dL (ref 0.44–1.00)
GFR calc Af Amer: >60 mL/min (ref >60)
GFR calc non Af Amer: >60 mL/min (ref >60)
Glucose, Bld: 136 mg/dL — ABNORMAL HIGH (ref 70–99)
Potassium: 3.9 mmol/L (ref 3.5–5.1)
Sodium: 140 mmol/L (ref 135–145)

## 2019-03-12 LAB — GLUCOSE, CAPILLARY
Glucose-Capillary: 130 mg/dL — ABNORMAL HIGH (ref 70–99)
Glucose-Capillary: 156 mg/dL — ABNORMAL HIGH (ref 70–99)
Glucose-Capillary: 227 mg/dL — ABNORMAL HIGH (ref 70–99)
Glucose-Capillary: 241 mg/dL — ABNORMAL HIGH (ref 70–99)

## 2019-03-12 LAB — APTT
aPTT: 53 seconds — ABNORMAL HIGH (ref 24–36)
aPTT: 40 seconds — ABNORMAL HIGH (ref 24–36)
aPTT: 60 seconds — ABNORMAL HIGH (ref 24–36)

## 2019-03-12 LAB — PHOSPHORUS: Phosphorus: 3.7 mg/dL (ref 2.5–4.6)

## 2019-03-12 LAB — MAGNESIUM: Magnesium: 2.2 mg/dL (ref 1.7–2.4)

## 2019-03-12 LAB — HEPARIN LEVEL (UNFRACTIONATED): Heparin Unfractionated: 1.32 IU/mL — ABNORMAL HIGH (ref 0.30–0.70)

## 2019-03-12 MED ORDER — DILTIAZEM HCL 60 MG PO TABS
30.0000 mg | ORAL_TABLET | Freq: Four times a day (QID) | ORAL | Status: DC
  Administered 2019-03-12 – 2019-03-13 (×4): 30 mg via ORAL
  Filled 2019-03-12 (×5): qty 1

## 2019-03-12 MED ORDER — SODIUM CHLORIDE 0.9 % IV BOLUS
500.0000 mL | Freq: Once | INTRAVENOUS | Status: AC
  Administered 2019-03-12: 500 mL via INTRAVENOUS

## 2019-03-12 MED ORDER — METOPROLOL TARTRATE 5 MG/5ML IV SOLN
5.0000 mg | Freq: Four times a day (QID) | INTRAVENOUS | Status: DC | PRN
  Administered 2019-03-12 – 2019-03-14 (×2): 5 mg via INTRAVENOUS
  Filled 2019-03-12 (×2): qty 5

## 2019-03-12 MED ORDER — METOPROLOL TARTRATE 5 MG/5ML IV SOLN: 5.0000 mg | Freq: Four times a day (QID) | INTRAVENOUS | Status: DC

## 2019-03-12 NOTE — Plan of Care (Signed)
  Problem: Education: Goal: Knowledge of General Education information will improve Description: Including pain rating scale, medication(s)/side effects and non-pharmacologic comfort measures Outcome: Progressing   Problem: Health Behavior/Discharge Planning: Goal: Ability to manage health-related needs will improve Outcome: Progressing   Problem: Clinical Measurements: Goal: Ability to maintain clinical measurements within normal limits will improve Outcome: Progressing Goal: Will remain free from infection Outcome: Progressing Goal: Diagnostic test results will improve Outcome: Progressing Goal: Respiratory complications will improve Outcome: Progressing Goal: Cardiovascular complication will be avoided Outcome: Progressing   Problem: Elimination: Goal: Will not experience complications related to urinary retention Outcome: Progressing   Problem: Pain Managment: Goal: General experience of comfort will improve Outcome: Progressing   Problem: Safety: Goal: Ability to remain free from injury will improve Outcome: Progressing   Problem: Skin Integrity: Goal: Risk for impaired skin integrity will decrease Outcome: Progressing   

## 2019-03-12 NOTE — Progress Notes (Signed)
PROGRESS NOTE    Cassandra Allen  YPP:[id number] DOB: January 17, 1946 DOA: 03/10/2019 PCP: [name], MD   Brief Narrative:  73 y.o. WF PMHx HTN, HLD, [name], diabetes type 2 uncontrolled with complication, chronic diastolic CHF, atrial fibrillation on Eliquis, metastatic colon cancer (s/p of partial colectomy, chemotherapy), s/p of colostomy, CKD stage III, depression,   Presents with abdominal pain. Patient was recently hospitalized from 3/20-3/29 due to post operation abdominal abscess. She had perc drain placement 01/31/19. Pt was also discharged to nursing home on Augmentin for 7 days.    Patient states that she developed worsening abdominal pain today, which is diffuse, worse on upper abdomen, constant, 10 out of 10 in severity, sharp, radiating to the back.  It is associated with a fever and chills.  She also has nausea vomiting several times.  No diarrhea. Patient denies chest pain, cough, shortness of breath.  No symptoms of UTI or unilateral weakness.     ED Course: pt was found to have WBC 12.7, lactic acid 2.5, troponin 0.08, BNP 359.3, procalcitonin 4.38, potassium 3.2, renal function close to baseline, temperature 100.7, oxygen saturation 90 to 99% on room air, respiration rate 30s. Previous CT 03/05/19 showed partial decrease in size to 19.4cm.  Repeat CT shows interval removal of LLQ drain with slight increase in size of complex collection now 20.2 cm.  There is also a persistent complex gas/fluid/fat attenutation LUQ 13 cm collection which persists persists on all scans. Pt is admitted to SDU as inpt. IR, Dr. Jarvis Newcomer was consulted by EDP.   Pt has A fib with RVR with HR upto 170s in ED. Cardizem gtt was started in ED, HR improved to 130s, but Blood pressure dropped to SBP 84. Pt received 2L of Ringer solution in  ED. cardizem gtt was d/c'ed. And another 500 cc of NS will be given. PCCM was consulted. Dr. Oletta Darter recommended to start phenylephrine IV.    Subjective: 4/29 A/O x4,  negative CP, negative S OB, positive abdominal pain rated 5/10 (LLQ>> RLQ).  Negative N/V.  Increased pain postprandial     Assessment & Plan:   Principal Problem:   Abscess of abdominal cavity (HCC) Active Problems:   Essential (primary) hypertension   HLD (hyperlipidemia)   Depression, recurrent (HCC)   Anticoagulant long-term use   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   CKD (chronic kidney disease) stage 3, GFR 30-59 ml/min (HCC)   Atrial fibrillation with RVR (HCC)   Chronic kidney disease (CKD), stage II (mild)   Sepsis (HCC)   Type II diabetes mellitus with renal manifestations (HCC)   Hypokalemia   Chronic diastolic CHF (congestive heart failure) (HCC)   Abnormal LFTs   Elevated troponin   Severe sepsis (HCC)  Sepsis due to abscess of abdominal cavity - Continue antibiotics per surgery - S/plaparotomy with mobilization of the splenic flexure and resection of the sigmoid, descending and distal transverse colon with a Hartman's pouch at the end of the colostomy in the right upper quadrant, on prior admission - 4/29 s/p IR drainage abscess   A. fib with RVR -Currently NSR.  However patient going in and out of A. fib with RVR, with HR as high as 160 - 4/29 restart Cardizem 30 mg QID, titrate up slowly.  Patient was on Cardizem CD 240 mg daily  - 4/29 PRN metoprolol   Essential (primary) hypertension: -hold bp med due to hypotension.  HLD (hyperlipidemia): -zocor   Depression, recurrent ([place]): -zoloft   Adenocarcinoma  of the colon metastatic to liver:  -f/u with Dr. Annamaria Boots   CKD (chronic kidney disease) stage 3, GFR 30-59 ml/min [facility]): Recent Labs  Lab 03/10/19 2058 03/11/19 0557 03/12/19 0437  CREATININE 1.09* 0.83 0.71  - Creatinine WNL   Atrial fibrillation with RVR ([place]): - Hold Eliquis until surgical procedure completed by IR - Currently sinus tachycardia    Diabetes type 2 controlled with complication - 2/35 hemoglobin A1c= 6.8  Type  II diabetes mellitus with renal manifestations ([place]):    Hypokalemia -Potassium goal> 4  Chronic diastolic CHF - Strict in and out -Daily weight -  2D echo on 12/23/2018 showed EF of 60-65%.   -Currently negative sign of acute exacerbation    Abnormal LFTs  -Multifactorial metastatic colon cancer to liver, sepsis   Elevated troponin: Troponin 0.08, no chest pain.  Likely due to demand ischemia secondary to sepsis and atrial fibrillation with RVR. - cycle CE q6 x3 and repeat EKG in the am  - on zocor - Risk factor stratification: will check FLP and A1C       DVT prophylaxis: Heparin Code Status: Full Family Communication: None Disposition Plan: TBD    Consultants:  IR  CCS     Procedures/Significant Events:  12/27/2018 Dr. [name] S/plaparotomy with mobilization of the splenic flexure and resection of the sigmoid, descending and distal transverse colon with a Hartman's pouch at the end of the colostomy in the right upper quadrant, 2/14 surgical pathology adenocarcinoma of proximal descending colon, sigmoid mass consistent with large villous adenoma 3/20 abdominal CT from SNF: Large rim-enhancing fluid connection with multiple foci over the lower abdomen with a likely intra-abdominal abscess She was readmitted and under IR drain 01/31/19, Dr. Earleen Newport.  She was transitioned to oral abx and discharged again on 02/09/19.  CT 01/31/19 : 12.6 x 24.6 x 18.4 cm enhancing fluid collection lower abdomen extending to the mid left upper abdomen, likely intra-abdominal abscess IR drain placement 01/31/2019 CT 03/05/2019: Abscess cavity measured 18.1 x 15 x 3.3 cm.  Previously this measured 12.6 x 24.6 x 18.4 drain was positioned within the left side of the abscess cavity abscess did not extend centrally toward midline.  There is also suspected upper abscess measuring 13.8 x 9.4 improved from prior measurement of 16.6 x 8.4 CT 03/08/2018 showed the prior drain have been removed.  There is  a persistent fluid collection left lower abdomen extending in central pelvis collection measured 20.2 x 3.9 with mildly increased size from 19.4 x 2.3 on the last scan.  No new fluid collections.  Caudate lobe of the liver shows metastases without significant interval change.  No new or progressive metastatic disease was identified.  Stable left pleural effusion and left lower lobe atelectasis. Chest x-ray showed: 1. Persistent small to moderate left pleural effusion with associated left basilar opacity. 2. Cardiomegaly with underlying mild diffuse pulmonary interstitial edema. 3. Aortic atherosclerosis. 4/29 CT guided drainage intra-abdominal abscess collection:-150 ml green purulent looking fluid removed from the complex mid upper collection after 12 Fr drain placement -30 ml of yellow purulent fluid removed from left lower abscess collection after placement of 12 Fr drain.     I have personally reviewed and interpreted all radiology studies and my findings are as above.  VENTILATOR SETTINGS:    Cultures   Antimicrobials: Anti-infectives (From admission, onward)   Start     Stop   03/11/19 1000  ceFEPIme (MAXIPIME) 2 g in sodium chloride 0.9 % 100 mL IVPB  03/11/19 0900  metroNIDAZOLE (FLAGYL) IVPB 500 mg         03/10/19 2100  ceFEPIme (MAXIPIME) 2 g in sodium chloride 0.9 % 100 mL IVPB     03/10/19 2219   03/10/19 2100  metroNIDAZOLE (FLAGYL) IVPB 500 mg     03/11/19 0114       Devices    LINES / TUBES:  12 French drain mid upper abdomen 4/29>> 12 French drain left lower abdomen 4/29>>     Continuous Infusions:  sodium chloride 75 mL/hr at 03/12/19 0315   sodium chloride 10 mL/hr at 03/12/19 0600   ceFEPime (MAXIPIME) IV Stopped (03/11/19 2209)   heparin 1,000 Units/hr (03/12/19 0600)   metronidazole Stopped (03/12/19 0132)   phenylephrine (NEO-SYNEPHRINE) Adult infusion       Objective: Vitals:   03/12/19 0446 03/12/19 0500 03/12/19 0600  03/12/19 0700  BP:  125/90 111/71   Pulse:  (!) 104 (!) 106   Resp:  17 (!) 21   Temp:      TempSrc:    Oral  SpO2:  100% 100%   Weight: 79.8 kg     Height:        Intake/Output Summary (Last 24 hours) at 03/12/2019 6578 Last data filed at 03/12/2019 0600 Gross per 24 hour  Intake 874.15 ml  Output 1300 ml  Net -425.85 ml   Filed Weights   03/10/19 2033 03/11/19 0344 03/12/19 0446  Weight: 72.6 kg 77.7 kg 79.8 kg    Examination:  General: A/O x4, no acute respiratory distress Eyes: negative scleral hemorrhage, negative anisocoria, negative icterus ENT: Negative Runny nose, negative gingival bleeding, poor dentation Neck:  Negative scars, masses, torticollis, lymphadenopathy, JVD Lungs: Clear to auscultation bilaterally without wheezes or crackles Cardiovascular: Tachycardic, without murmur gallop or rub normal S1 and S2 Abdomen: Positive abdominal pain LLQ>>> RLQ, nondistended, positive soft, bowel sounds, no rebound, no ascites, no appreciable mass, 2 JP drains in place RLQ/LLQ draining light brownish fluid. Extremities: No significant cyanosis, clubbing, or edema bilateral lower extremities Skin: Negative rashes, lesions, ulcers Psychiatric:  Negative depression, negative anxiety, negative fatigue, negative mania  Central nervous system:  Cranial nerves II through XII intact, tongue/uvula midline, all extremities muscle strength 5/5, sensation intact throughout, negative dysarthria, negative expressive aphasia, negative receptive aphasia.  .     Data Reviewed: Care during the described time interval was provided by me .  I have reviewed this patient's available data, including medical history, events of note, physical examination, and all test results as part of my evaluation.   CBC: Recent Labs  Lab 03/10/19 2058 03/10/19 2200 03/11/19 0557 03/12/19 0437  WBC 12.7*  --  15.2* 10.6*  NEUTROABS 11.8*  --   --   --   HGB 11.1* 12.2 8.7* 9.4*  HCT 38.6 36.0 30.9*  33.2*  MCV 91.9  --  94.5 95.1  PLT 359  --  299 469   Basic Metabolic Panel: Recent Labs  Lab 03/10/19 2058 03/10/19 2200 03/11/19 0238 03/11/19 0557 03/12/19 0437  NA 141 142  --  141 140  K 3.1* 3.2*  --  3.7 3.9  CL 102  --   --  104 104  CO2 31  --   --  27 27  GLUCOSE 174*  --   --  114* 136*  BUN 20  --   --  20 19  CREATININE 1.09*  --   --  0.83 0.71  CALCIUM 8.7*  --   --  8.0* 8.1*  MG  --   --  1.1*  --  2.2  PHOS  --   --   --  4.0 3.7   GFR: Estimated Creatinine Clearance: 62.2 mL/min (by C-G formula based on SCr of 0.71 mg/dL). Liver Function Tests: Recent Labs  Lab 03/10/19 2058  AST 237*  ALT 95*  ALKPHOS 1,377*  BILITOT 2.3*  PROT 6.0*  ALBUMIN 2.5*   Recent Labs  Lab 03/10/19 2058  LIPASE 38   No results for input(s): AMMONIA in the last 168 hours. Coagulation Profile: Recent Labs  Lab 03/11/19 0238  INR 2.1*   Cardiac Enzymes: Recent Labs  Lab 03/10/19 2058 03/11/19 0238 03/11/19 0557  TROPONINI 0.08* 0.06* 0.05*   BNP (last 3 results) No results for input(s): PROBNP in the last 8760 hours. HbA1C: Recent Labs    03/11/19 0238  HGBA1C 6.8*   CBG: Recent Labs  Lab 03/11/19 0738 03/11/19 1123 03/11/19 1639 03/11/19 2132 03/12/19 0748  GLUCAP 110* 107* 87 161* 130*   Lipid Profile: Recent Labs    03/11/19 0238  CHOL 91  HDL 23*  LDLCALC 52  TRIG 78  CHOLHDL 4.0   Thyroid Function Tests: No results for input(s): TSH, T4TOTAL, FREET4, T3FREE, THYROIDAB in the last 72 hours. Anemia Panel: No results for input(s): VITAMINB12, FOLATE, FERRITIN, TIBC, IRON, RETICCTPCT in the last 72 hours. Urine analysis:    Component Value Date/Time   COLORURINE AMBER (A) 03/10/2019 0041   APPEARANCEUR CLEAR 03/10/2019 0041   LABSPEC 1.032 (H) 03/10/2019 0041   PHURINE 5.0 03/10/2019 0041   GLUCOSEU NEGATIVE 03/10/2019 0041   HGBUR SMALL (A) 03/10/2019 0041   BILIRUBINUR NEGATIVE 03/10/2019 0041   KETONESUR NEGATIVE  03/10/2019 0041   PROTEINUR 30 (A) 03/10/2019 0041   UROBILINOGEN 0.2 03/25/2015 2317   NITRITE POSITIVE (A) 03/10/2019 0041   LEUKOCYTESUR MODERATE (A) 03/10/2019 0041   Sepsis Labs: @LABRCNTIP (procalcitonin:4,lacticidven:4)  ) Recent Results (from the past 240 hour(s))  Blood Culture (routine x 2)     Status: None (Preliminary result)   Collection Time: 03/10/19  9:37 PM  Result Value Ref Range Status   Specimen Description BLOOD RIGHT HAND  Final   Special Requests   Final    BOTTLES DRAWN AEROBIC AND ANAEROBIC Blood Culture results may not be optimal due to an inadequate volume of blood received in culture bottles   Culture  Setup Time   Final    GRAM NEGATIVE RODS IN BOTH AEROBIC AND ANAEROBIC BOTTLES Organism ID to follow CRITICAL RESULT CALLED TO, READ BACK BY AND VERIFIED WITH: Cristopher Estimable PharmD 11:10 03/11/19 ([email or web address]) Performed at [facility], [address], [place], [place] [address]    Culture GRAM NEGATIVE RODS  Final   Report Status PENDING  Incomplete  Blood Culture ID Panel (Reflexed)     Status: Abnormal   Collection Time: 03/10/19  9:37 PM  Result Value Ref Range Status   Enterococcus species NOT DETECTED NOT DETECTED Final   Listeria monocytogenes NOT DETECTED NOT DETECTED Final   Staphylococcus species NOT DETECTED NOT DETECTED Final   Staphylococcus aureus (BCID) NOT DETECTED NOT DETECTED Final   Streptococcus species NOT DETECTED NOT DETECTED Final   Streptococcus agalactiae NOT DETECTED NOT DETECTED Final   Streptococcus pneumoniae NOT DETECTED NOT DETECTED Final   Streptococcus pyogenes NOT DETECTED NOT DETECTED Final   Acinetobacter baumannii NOT DETECTED NOT DETECTED Final   Enterobacteriaceae species DETECTED (A) NOT DETECTED Final    Comment:  Enterobacteriaceae represent a large family of gram-negative bacteria, not a single organism. CRITICAL RESULT CALLED TO, READ BACK BY AND VERIFIED WITH: Cristopher Estimable PharmD 11:10 03/11/19 ([email or web address])     Enterobacter cloacae complex NOT DETECTED NOT DETECTED Final   Escherichia coli DETECTED (A) NOT DETECTED Final    Comment: CRITICAL RESULT CALLED TO, READ BACK BY AND VERIFIED WITH: Cristopher Estimable PharmD 11:10 03/11/19 ([email or web address])    Klebsiella oxytoca NOT DETECTED NOT DETECTED Final   Klebsiella pneumoniae NOT DETECTED NOT DETECTED Final   Proteus species NOT DETECTED NOT DETECTED Final   Serratia marcescens NOT DETECTED NOT DETECTED Final   Carbapenem resistance NOT DETECTED NOT DETECTED Final   Haemophilus influenzae NOT DETECTED NOT DETECTED Final   Neisseria meningitidis NOT DETECTED NOT DETECTED Final   Pseudomonas aeruginosa NOT DETECTED NOT DETECTED Final   Candida albicans NOT DETECTED NOT DETECTED Final   Candida glabrata NOT DETECTED NOT DETECTED Final   Candida krusei NOT DETECTED NOT DETECTED Final   Candida parapsilosis NOT DETECTED NOT DETECTED Final   Candida tropicalis NOT DETECTED NOT DETECTED Final    Comment: Performed at [facility], [place] [address] [place], [place] [address]  Blood Culture (routine x 2)     Status: None (Preliminary result)   Collection Time: 03/10/19  9:50 PM  Result Value Ref Range Status   Specimen Description BLOOD LEFT ARM  Final   Special Requests   Final    BOTTLES DRAWN AEROBIC AND ANAEROBIC Blood Culture adequate volume   Culture  Setup Time   Final    GRAM NEGATIVE RODS IN BOTH AEROBIC AND ANAEROBIC BOTTLES CRITICAL VALUE NOTED.  VALUE IS CONSISTENT WITH PREVIOUSLY REPORTED AND CALLED VALUE. Performed at [facility], [place] [address] [place], [place] [address]    Culture GRAM NEGATIVE RODS  Final   Report Status PENDING  Incomplete  MRSA PCR Screening     Status: None   Collection Time: 03/11/19  3:47 AM  Result Value Ref Range Status   MRSA by PCR NEGATIVE NEGATIVE Final    Comment:        The GeneXpert MRSA Assay (FDA approved for NASAL specimens only), is one component of a comprehensive MRSA  colonization surveillance program. It is not intended to diagnose MRSA infection nor to guide or monitor treatment for MRSA infections. Performed at [facility], [place] [address], [place], [place] [address]   Aerobic/Anaerobic Culture (surgical/deep wound)     Status: None (Preliminary result)   Collection Time: 03/11/19  3:00 PM  Result Value Ref Range Status   Specimen Description ABSCESS DRAINAGE  Final   Special Requests MID UPPER ABDOMEN  Final   Gram Stain   Final    ABUNDANT WBC PRESENT,BOTH PMN AND MONONUCLEAR MODERATE GRAM VARIABLE ROD FEW GRAM NEGATIVE RODS Performed at [facility], [place] [address] [place], [place] [address]    Culture PENDING  Incomplete   Report Status PENDING  Incomplete  Aerobic/Anaerobic Culture (surgical/deep wound)     Status: None (Preliminary result)   Collection Time: 03/11/19  3:00 PM  Result Value Ref Range Status   Specimen Description ABSCESS DRAINAGE  Final   Special Requests LEFT LOWER ABDOMEN  Final   Gram Stain   Final    ABUNDANT WBC PRESENT,BOTH PMN AND MONONUCLEAR RARE GRAM POSITIVE COCCI FEW GRAM NEGATIVE RODS FEW GRAM VARIABLE ROD Performed at [facility], [place] [address] [place], [place] [address]  Culture PENDING  Incomplete   Report Status PENDING  Incomplete         Radiology Studies: Ct Abdomen Pelvis W Contrast  Result Date: 03/11/2019 CLINICAL DATA:  Follow-up abdominal abscess. Previous sigmoid colectomy for colon carcinoma. Undergoing chemotherapy. EXAM: CT ABDOMEN AND PELVIS WITH CONTRAST TECHNIQUE: Multidetector CT imaging of the abdomen and pelvis was performed using the standard protocol following bolus administration of intravenous contrast. CONTRAST:  175mL OMNIPAQUE IOHEXOL 300 MG/ML  SOLN COMPARISON:  03/05/2019 FINDINGS: Lower Chest: Small to moderate left pleural effusion and left lower lobe atelectasis show no significant change. Hepatobiliary: 3.9 cm hypovascular mass involving the  caudate lobe of the liver shows no significant change compared to prior study. No new or enlarging liver masses are identified. Gallbladder is unremarkable. No evidence of biliary ductal dilatation. Pancreas:  No mass or inflammatory changes. Spleen: No evidence of splenomegaly. A few tiny sub-cm low-attenuation lesions in the spleen are stable. Adrenals/Urinary Tract: Stable 3.3 cm left adrenal mass, previously shown to represent an adenoma by MRI. Stable mild left renal atrophy. No evidence of renal masses or hydronephrosis. Unremarkable unopacified urinary bladder. Stomach/Bowel: Postop changes again seen from previous left hemicolectomy with transverse colostomy. A large gas and fluid collection is again seen in the upper abdomen anterior to the stomach. This measures 13.2 x 9.7 cm, without significant change in size compared to previous study. Previously seen left abdominal percutaneous drainage catheter has been removed. A persistent fluid collection in the left lower abdomen and extending into the central pelvis is seen at this location. This collection measures 20.2 x 3.9 cm, with mildly increased in size from 19.4 x 2.3 cm previously. No new fluid collections are identified. No evidence of bowel obstruction no evidence of free intraperitoneal air. Vascular/Lymphatic: No pathologically enlarged lymph nodes. No abdominal aortic aneurysm. Reproductive:  No mass or other significant abnormality. Other:  None. Musculoskeletal:  No suspicious bone lesions identified. IMPRESSION: 1. Persistent large gas and fluid collection in upper abdomen anterior to the stomach, without significant change. 2. Mild increase in size of large fluid collection extending across the left lower abdomen and central pelvis, following removal of percutaneous drainage catheter. 3. Caudate lobe liver metastasis, without significant interval change. No new or progressive metastatic disease identified. 4. Stable left pleural effusion and  left lower lobe atelectasis. Electronically Signed   By: Earle Gell M.D.   On: 03/11/2019 00:10   Dg Chest Port 1 View  Result Date: 03/10/2019 CLINICAL DATA:  Initial evaluation for acute fever. EXAM: PORTABLE CHEST 1 VIEW COMPARISON:  Prior radiograph from 02/05/2019 FINDINGS: Right-sided Port-A-Cath in place, stable. Cardiomegaly, unchanged. Mediastinal silhouette normal. Aortic atherosclerosis. Persistent small to moderate left pleural effusion. Associated left basilar opacity could reflect atelectasis or infiltrate. Underlying mild diffuse pulmonary interstitial edema. No pneumothorax. No acute osseous finding. IMPRESSION: 1. Persistent small to moderate left pleural effusion with associated left basilar opacity, which may reflect atelectasis or infiltrate. 2. Cardiomegaly with underlying mild diffuse pulmonary interstitial edema. 3. Aortic atherosclerosis. Electronically Signed   By: Jeannine Boga M.D.   On: 03/10/2019 22:03   Ct Image Guided Drainage By Percutaneous Catheter  Result Date: 03/11/2019 INDICATION: 73 year old with metastatic colon cancer and status post partial colectomy and end colostomy. History of postoperative abscess with left lower abdominal abscess drain. The drain was recently removed due to partial dislodgement. Patient needs placement of a new drainage catheter within the lower abdominal abscess. In addition, the patient has a complex air-fluid collection  in the midline of the upper abdomen. This is a concerning for complex abscess collection. EXAM: CT GUIDED DRAINAGE OF MIDLINE UPPER ABDOMINAL ABSCESS CT-GUIDED DRAINAGE OF LEFT LOWER ABDOMINAL ABSCESS MEDICATIONS: The patient is currently admitted to the hospital and receiving intravenous antibiotics. ANESTHESIA/SEDATION: 0.5 mg IV Versed 50 mcg IV Fentanyl Moderate Sedation Time: The patient was continuously monitored during the procedure by the interventional radiology nurse under my direct supervision.  COMPLICATIONS: None immediate. TECHNIQUE: Informed written consent was obtained from the patient after a thorough discussion of the procedural risks, benefits and alternatives. All questions were addressed. Maximal Sterile Barrier Technique was utilized including caps, mask, sterile gowns, sterile gloves, sterile drape, hand hygiene and skin antiseptic. A timeout was performed prior to the initiation of the procedure. PROCEDURE: Patient was placed supine on the CT scanner. Images through the abdomen were obtained. The mid upper abdomen was prepped and draped in sterile fashion. Skin was anesthetized with 1% lidocaine. 18 gauge trocar needle was directed into the complex air-fluid collection with CT guidance. No air or fluid was aspirating from the needle. Stiff Amplatz wire was advanced into the irregular collection and a Yueh catheter was advanced over the wire. Thick green fluid was aspirated from the catheter. Amplatz wire was advanced back into the collection and the tract was dilated to accommodate a 12 [place] drain. Greater than 150 mL of thick green fluid was removed from the complex upper abdominal collection. Catheter was sutured to skin and attached to a suction bulb. Attention was directed to the fluid collection in the left lower abdomen that previously had a drain. Left lower abdomen was prepped and draped in a sterile fashion. Skin was anesthetized with 1% lidocaine. Using CT guidance, 18 gauge trocar needle was directed into the left lower quadrant collection. Yellow purulent fluid was aspirated. Stiff Amplatz wire was advanced into the collection. Tract was dilated to accommodate a 12 [place] drain. Approximately 30 mL of purulent fluid was removed. Catheter was sutured to skin and attached to a suction bulb. Sample of both abscess collections were sent for culture. FINDINGS: Complex air-fluid collection in the mid upper abdomen. Greater than 150 mL of foul-smelling thick green fluid was removed from  the upper abdominal collection. 12 French drain was placed within this upper abdominal collection. 12 French drain placed in the residual left lower abdominal collection and 30 mL of yellow purulent fluid was removed. IMPRESSION: 1. CT-guided placement of a 12 French drain in the complex mid upper abdominal abscess collection. 2. CT-guided placement of a 12 French drain in the residual left lower abdominal abscess collection. Electronically Signed   By: Markus Daft M.D.   On: 03/11/2019 15:42   Ct Image Guided Drainage By Percutaneous Catheter  Result Date: 03/11/2019 INDICATION: 73 year old with metastatic colon cancer and status post partial colectomy and end colostomy. History of postoperative abscess with left lower abdominal abscess drain. The drain was recently removed due to partial dislodgement. Patient needs placement of a new drainage catheter within the lower abdominal abscess. In addition, the patient has a complex air-fluid collection in the midline of the upper abdomen. This is a concerning for complex abscess collection. EXAM: CT GUIDED DRAINAGE OF MIDLINE UPPER ABDOMINAL ABSCESS CT-GUIDED DRAINAGE OF LEFT LOWER ABDOMINAL ABSCESS MEDICATIONS: The patient is currently admitted to the hospital and receiving intravenous antibiotics. ANESTHESIA/SEDATION: 0.5 mg IV Versed 50 mcg IV Fentanyl Moderate Sedation Time: The patient was continuously monitored during the procedure by the interventional radiology nurse under my  direct supervision. COMPLICATIONS: None immediate. TECHNIQUE: Informed written consent was obtained from the patient after a thorough discussion of the procedural risks, benefits and alternatives. All questions were addressed. Maximal Sterile Barrier Technique was utilized including caps, mask, sterile gowns, sterile gloves, sterile drape, hand hygiene and skin antiseptic. A timeout was performed prior to the initiation of the procedure. PROCEDURE: Patient was placed supine on the CT  scanner. Images through the abdomen were obtained. The mid upper abdomen was prepped and draped in sterile fashion. Skin was anesthetized with 1% lidocaine. 18 gauge trocar needle was directed into the complex air-fluid collection with CT guidance. No air or fluid was aspirating from the needle. Stiff Amplatz wire was advanced into the irregular collection and a Yueh catheter was advanced over the wire. Thick green fluid was aspirated from the catheter. Amplatz wire was advanced back into the collection and the tract was dilated to accommodate a 12 [place] drain. Greater than 150 mL of thick green fluid was removed from the complex upper abdominal collection. Catheter was sutured to skin and attached to a suction bulb. Attention was directed to the fluid collection in the left lower abdomen that previously had a drain. Left lower abdomen was prepped and draped in a sterile fashion. Skin was anesthetized with 1% lidocaine. Using CT guidance, 18 gauge trocar needle was directed into the left lower quadrant collection. Yellow purulent fluid was aspirated. Stiff Amplatz wire was advanced into the collection. Tract was dilated to accommodate a 12 [place] drain. Approximately 30 mL of purulent fluid was removed. Catheter was sutured to skin and attached to a suction bulb. Sample of both abscess collections were sent for culture. FINDINGS: Complex air-fluid collection in the mid upper abdomen. Greater than 150 mL of foul-smelling thick green fluid was removed from the upper abdominal collection. 12 French drain was placed within this upper abdominal collection. 12 French drain placed in the residual left lower abdominal collection and 30 mL of yellow purulent fluid was removed. IMPRESSION: 1. CT-guided placement of a 12 French drain in the complex mid upper abdominal abscess collection. 2. CT-guided placement of a 12 French drain in the residual left lower abdominal abscess collection. Electronically Signed   By: Markus Daft  M.D.   On: 03/11/2019 15:42        Scheduled Meds:  insulin aspart  0-9 Units Subcutaneous TID WC   pantoprazole (PROTONIX) IV  40 mg Intravenous QHS   sodium chloride flush  5 mL Intracatheter Q8H   Continuous Infusions:  sodium chloride 75 mL/hr at 03/12/19 0315   sodium chloride 10 mL/hr at 03/12/19 0600   ceFEPime (MAXIPIME) IV Stopped (03/11/19 2209)   heparin 1,000 Units/hr (03/12/19 0600)   metronidazole Stopped (03/12/19 0132)   phenylephrine (NEO-SYNEPHRINE) Adult infusion       LOS: 1 day   The patient is critically ill with multiple organ systems failure and requires high complexity decision making for assessment and support, frequent evaluation and titration of therapies, application of advanced monitoring technologies and extensive interpretation of multiple databases. Critical Care Time devoted to patient care services described in this note  Time spent: 40 minutes     Cheral Cappucci, Geraldo Docker, MD [facility] Pager [phone]  If 7PM-7AM, please contact night-coverage [email or web address] Password [email or web address] 03/12/2019, 8:07 AM

## 2019-03-12 NOTE — Progress Notes (Signed)
ANTICOAGULATION CONSULT NOTE - Initial Consult  Pharmacy Consult for Apxiaban to Heparin Indication: atrial fibrillation  Allergies  Allergen Reactions  . Metformin And Related Nausea And Vomiting  . Prednisone Nausea And Vomiting    Patient Measurements: Height: 5\' 2"  (157.5 cm) Weight: 175 lb 14.8 oz (79.8 kg) IBW/kg (Calculated) : 50.1  Vital Signs: Temp: 98.2 F (36.8 C) (04/29 1100) Temp Source: Oral (04/29 1100) BP: 108/75 (04/29 1400) Pulse Rate: 31 (04/29 1300)  Labs: Recent Labs    03/10/19 2058 03/10/19 2200 03/11/19 0238 03/11/19 0557 03/12/19 0437  HGB 11.1* 12.2  --  8.7* 9.4*  HCT 38.6 36.0  --  30.9* 33.2*  PLT 359  --   --  299 278  APTT  --   --  35  --  53*  LABPROT  --   --  22.8*  --   --   INR  --   --  2.1*  --   --   HEPARINUNFRC  --   --   --   --  1.32*  CREATININE 1.09*  --   --  0.83 0.71  TROPONINI 0.08*  --  0.06* 0.05*  --     Estimated Creatinine Clearance: 62.2 mL/min (by C-G formula based on SCr of 0.71 mg/dL).   Medical History: Past Medical History:  Diagnosis Date  . Acute diastolic heart failure ([place]) 12/22/2018  . Arthritis   . Atrial fibrillation, chronic 12/22/2018  . Cancer of left colon ([place]) 10/30/2018  . Cancer of sigmoid colon  12/27/2018  . Diabetes mellitus without complication ([place])   . Hypertension   . Obesity (BMI 30-39.9) 12/27/2018    Assessment: 73 year old female to begin heparin for Afib post abdominal drain placements. On apixaban prior to admission for atrial fibrillation with last dose 4/27@1700 . Pharmacy consulted to start heparin while off of apixaban.   APTT subtherapeutic at 40, not correlating with heparin levels. CBCs stable, no s/sx of bleeding or problems with heparin gtt per nurse.   Goal of Therapy:  Heparin level 0.3-0.7 units/ml Monitor platelets by anticoagulation protocol: Yes  PTT = 66 to 102 seconds   Plan:  Increase heparin to 1350 units Will obtain aPTT at 6 hours  Daily  heparin, PTT (until correlating) Follow CBC and s/sx of bleeding   Gwenlyn Found, Sherian Rein D PGY1 Pharmacy Resident  Phone [phone] 03/12/2019   2:57 PM

## 2019-03-12 NOTE — Progress Notes (Signed)
ANTICOAGULATION CONSULT NOTE - Initial Consult  Pharmacy Consult for Apxiaban to Heparin Indication: atrial fibrillation  Allergies  Allergen Reactions  . Metformin And Related Nausea And Vomiting  . Prednisone Nausea And Vomiting    Patient Measurements: Height: 5\' 2"  (157.5 cm) Weight: 175 lb 14.8 oz (79.8 kg) IBW/kg (Calculated) : 50.1  Vital Signs: Temp: 97.6 F (36.4 C) (04/29 0346) Temp Source: Oral (04/29 0700) BP: 119/95 (04/29 0800) Pulse Rate: 115 (04/29 0800)  Labs: Recent Labs    03/10/19 2058 03/10/19 2200 03/11/19 0238 03/11/19 0557 03/12/19 0437  HGB 11.1* 12.2  --  8.7* 9.4*  HCT 38.6 36.0  --  30.9* 33.2*  PLT 359  --   --  299 278  APTT  --   --  35  --  53*  LABPROT  --   --  22.8*  --   --   INR  --   --  2.1*  --   --   HEPARINUNFRC  --   --   --   --  1.32*  CREATININE 1.09*  --   --  0.83 0.71  TROPONINI 0.08*  --  0.06* 0.05*  --     Estimated Creatinine Clearance: 62.2 mL/min (by C-G formula based on SCr of 0.71 mg/dL).   Medical History: Past Medical History:  Diagnosis Date  . Acute diastolic heart failure ([place]) 12/22/2018  . Arthritis   . Atrial fibrillation, chronic 12/22/2018  . Cancer of left colon ([place]) 10/30/2018  . Cancer of sigmoid colon  12/27/2018  . Diabetes mellitus without complication ([place])   . Hypertension   . Obesity (BMI 30-39.9) 12/27/2018    Assessment: 73 year old female to begin heparin for Afib post abdominal drain placements. On apixaban prior to admission - > last dose 4/27 PM Heparin to begin at 9 pm  APTT subtherapeutic at 53, not correlating with heparin levels. CBCs stable, no s/sx of bleeding reported.   Goal of Therapy:  Heparin level 0.3-0.7 units/ml Monitor platelets by anticoagulation protocol: Yes  PTT = 66 to 102 seconds   Plan:  Increase heparin to 1100 units Daily heparin, PTT (until correlating) Follow CBC and s/sx of bleeding   Gwenlyn Found, Sherian Rein D PGY1 Pharmacy Resident  Phone  [phone] 03/12/2019   9:03 AM

## 2019-03-12 NOTE — Progress Notes (Signed)
Patient ID: Cassandra Allen, female   DOB: 1946/09/24, 73 y.o.   MRN: [id number]       Subjective: Patient sitting up and feels ok.  Tolerating clear liquids.  No nausea.  Abdominal pain about the same.  Objective: Vital signs in last 24 hours: Temp:  [97.6 F (36.4 C)-98.4 F (36.9 C)] 97.6 F (36.4 C) (04/29 0346) Pulse Rate:  [39-120] 115 (04/29 0800) Resp:  [14-31] 21 (04/29 0800) BP: (90-125)/(51-95) 119/95 (04/29 0800) SpO2:  [94 %-100 %] 94 % (04/29 0800) Weight:  [79.8 kg] 79.8 kg (04/29 0446) Last BM Date: 03/11/19  Intake/Output from previous day: 04/28 0701 - 04/29 0700 In: 987.4 [I.V.:172.7; IV Piggyback:814.6] Out: 1300 [Urine:600; Drains:700] Intake/Output this shift: Total I/O In: 20 [I.V.:20] Out: 50 [Stool:50]  PE: Abd: soft, appropriately tender, upper abdominal drain with mostly tan cloudy purulent output with a small amount of greasy thin output.  LLQ drain with tan cloudy purulent output as well.  RLQ colostomy with dark solid stool present.  Lab Results:  Recent Labs    03/11/19 0557 03/12/19 0437  WBC 15.2* 10.6*  HGB 8.7* 9.4*  HCT 30.9* 33.2*  PLT 299 278   BMET Recent Labs    03/11/19 0557 03/12/19 0437  NA 141 140  K 3.7 3.9  CL 104 104  CO2 27 27  GLUCOSE 114* 136*  BUN 20 19  CREATININE 0.83 0.71  CALCIUM 8.0* 8.1*   PT/INR Recent Labs    03/11/19 0238  LABPROT 22.8*  INR 2.1*   CMP     Component Value Date/Time   NA 140 03/12/2019 0437   NA 143 02/14/2019   K 3.9 03/12/2019 0437   CL 104 03/12/2019 0437   CL 101 02/14/2019   CO2 27 03/12/2019 0437   CO2 23 02/14/2019   GLUCOSE 136 (H) 03/12/2019 0437   BUN 19 03/12/2019 0437   BUN 25 (A) 02/14/2019   CREATININE 0.71 03/12/2019 0437   CREATININE 1.04 (H) 12/18/2018 1029   CALCIUM 8.1 (L) 03/12/2019 0437   CALCIUM 8.7 02/14/2019   PROT 6.0 (L) 03/10/2019 2058   PROT 5.3 (A) 01/23/2019   PROT 5.3 (A) 01/23/2019   ALBUMIN 2.5 (L) 03/10/2019 2058   ALBUMIN 2.4 01/23/2019   ALBUMIN 2.4 01/23/2019   AST 237 (H) 03/10/2019 2058   AST 60 (H) 12/18/2018 1029   ALT 95 (H) 03/10/2019 2058   ALT 117 (H) 12/18/2018 1029   ALKPHOS 1,377 (H) 03/10/2019 2058   BILITOT 2.3 (H) 03/10/2019 2058   BILITOT 0.5 12/18/2018 1029   GFRNONAA >60 03/12/2019 0437   GFRNONAA 54 (L) 12/18/2018 1029   GFRAA >60 03/12/2019 0437   GFRAA >60 12/18/2018 1029   Lipase     Component Value Date/Time   LIPASE 38 03/10/2019 2058       Studies/Results: Ct Abdomen Pelvis W Contrast  Result Date: 03/11/2019 CLINICAL DATA:  Follow-up abdominal abscess. Previous sigmoid colectomy for colon carcinoma. Undergoing chemotherapy. EXAM: CT ABDOMEN AND PELVIS WITH CONTRAST TECHNIQUE: Multidetector CT imaging of the abdomen and pelvis was performed using the standard protocol following bolus administration of intravenous contrast. CONTRAST:  133mL OMNIPAQUE IOHEXOL 300 MG/ML  SOLN COMPARISON:  03/05/2019 FINDINGS: Lower Chest: Small to moderate left pleural effusion and left lower lobe atelectasis show no significant change. Hepatobiliary: 3.9 cm hypovascular mass involving the caudate lobe of the liver shows no significant change compared to prior study. No new or enlarging liver masses are identified. Gallbladder is  unremarkable. No evidence of biliary ductal dilatation. Pancreas:  No mass or inflammatory changes. Spleen: No evidence of splenomegaly. A few tiny sub-cm low-attenuation lesions in the spleen are stable. Adrenals/Urinary Tract: Stable 3.3 cm left adrenal mass, previously shown to represent an adenoma by MRI. Stable mild left renal atrophy. No evidence of renal masses or hydronephrosis. Unremarkable unopacified urinary bladder. Stomach/Bowel: Postop changes again seen from previous left hemicolectomy with transverse colostomy. A large gas and fluid collection is again seen in the upper abdomen anterior to the stomach. This measures 13.2 x 9.7 cm, without significant  change in size compared to previous study. Previously seen left abdominal percutaneous drainage catheter has been removed. A persistent fluid collection in the left lower abdomen and extending into the central pelvis is seen at this location. This collection measures 20.2 x 3.9 cm, with mildly increased in size from 19.4 x 2.3 cm previously. No new fluid collections are identified. No evidence of bowel obstruction no evidence of free intraperitoneal air. Vascular/Lymphatic: No pathologically enlarged lymph nodes. No abdominal aortic aneurysm. Reproductive:  No mass or other significant abnormality. Other:  None. Musculoskeletal:  No suspicious bone lesions identified. IMPRESSION: 1. Persistent large gas and fluid collection in upper abdomen anterior to the stomach, without significant change. 2. Mild increase in size of large fluid collection extending across the left lower abdomen and central pelvis, following removal of percutaneous drainage catheter. 3. Caudate lobe liver metastasis, without significant interval change. No new or progressive metastatic disease identified. 4. Stable left pleural effusion and left lower lobe atelectasis. Electronically Signed   By: Earle Gell M.D.   On: 03/11/2019 00:10   Dg Chest Port 1 View  Result Date: 03/10/2019 CLINICAL DATA:  Initial evaluation for acute fever. EXAM: PORTABLE CHEST 1 VIEW COMPARISON:  Prior radiograph from 02/05/2019 FINDINGS: Right-sided Port-A-Cath in place, stable. Cardiomegaly, unchanged. Mediastinal silhouette normal. Aortic atherosclerosis. Persistent small to moderate left pleural effusion. Associated left basilar opacity could reflect atelectasis or infiltrate. Underlying mild diffuse pulmonary interstitial edema. No pneumothorax. No acute osseous finding. IMPRESSION: 1. Persistent small to moderate left pleural effusion with associated left basilar opacity, which may reflect atelectasis or infiltrate. 2. Cardiomegaly with underlying mild  diffuse pulmonary interstitial edema. 3. Aortic atherosclerosis. Electronically Signed   By: Jeannine Boga M.D.   On: 03/10/2019 22:03   Ct Image Guided Drainage By Percutaneous Catheter  Result Date: 03/11/2019 INDICATION: 73 year old with metastatic colon cancer and status post partial colectomy and end colostomy. History of postoperative abscess with left lower abdominal abscess drain. The drain was recently removed due to partial dislodgement. Patient needs placement of a new drainage catheter within the lower abdominal abscess. In addition, the patient has a complex air-fluid collection in the midline of the upper abdomen. This is a concerning for complex abscess collection. EXAM: CT GUIDED DRAINAGE OF MIDLINE UPPER ABDOMINAL ABSCESS CT-GUIDED DRAINAGE OF LEFT LOWER ABDOMINAL ABSCESS MEDICATIONS: The patient is currently admitted to the hospital and receiving intravenous antibiotics. ANESTHESIA/SEDATION: 0.5 mg IV Versed 50 mcg IV Fentanyl Moderate Sedation Time: The patient was continuously monitored during the procedure by the interventional radiology nurse under my direct supervision. COMPLICATIONS: None immediate. TECHNIQUE: Informed written consent was obtained from the patient after a thorough discussion of the procedural risks, benefits and alternatives. All questions were addressed. Maximal Sterile Barrier Technique was utilized including caps, mask, sterile gowns, sterile gloves, sterile drape, hand hygiene and skin antiseptic. A timeout was performed prior to the initiation of the procedure. PROCEDURE:  Patient was placed supine on the CT scanner. Images through the abdomen were obtained. The mid upper abdomen was prepped and draped in sterile fashion. Skin was anesthetized with 1% lidocaine. 18 gauge trocar needle was directed into the complex air-fluid collection with CT guidance. No air or fluid was aspirating from the needle. Stiff Amplatz wire was advanced into the irregular collection  and a Yueh catheter was advanced over the wire. Thick green fluid was aspirated from the catheter. Amplatz wire was advanced back into the collection and the tract was dilated to accommodate a 12 [place] drain. Greater than 150 mL of thick green fluid was removed from the complex upper abdominal collection. Catheter was sutured to skin and attached to a suction bulb. Attention was directed to the fluid collection in the left lower abdomen that previously had a drain. Left lower abdomen was prepped and draped in a sterile fashion. Skin was anesthetized with 1% lidocaine. Using CT guidance, 18 gauge trocar needle was directed into the left lower quadrant collection. Yellow purulent fluid was aspirated. Stiff Amplatz wire was advanced into the collection. Tract was dilated to accommodate a 12 [place] drain. Approximately 30 mL of purulent fluid was removed. Catheter was sutured to skin and attached to a suction bulb. Sample of both abscess collections were sent for culture. FINDINGS: Complex air-fluid collection in the mid upper abdomen. Greater than 150 mL of foul-smelling thick green fluid was removed from the upper abdominal collection. 12 French drain was placed within this upper abdominal collection. 12 French drain placed in the residual left lower abdominal collection and 30 mL of yellow purulent fluid was removed. IMPRESSION: 1. CT-guided placement of a 12 French drain in the complex mid upper abdominal abscess collection. 2. CT-guided placement of a 12 French drain in the residual left lower abdominal abscess collection. Electronically Signed   By: Markus Daft M.D.   On: 03/11/2019 15:42   Ct Image Guided Drainage By Percutaneous Catheter  Result Date: 03/11/2019 INDICATION: 73 year old with metastatic colon cancer and status post partial colectomy and end colostomy. History of postoperative abscess with left lower abdominal abscess drain. The drain was recently removed due to partial dislodgement. Patient  needs placement of a new drainage catheter within the lower abdominal abscess. In addition, the patient has a complex air-fluid collection in the midline of the upper abdomen. This is a concerning for complex abscess collection. EXAM: CT GUIDED DRAINAGE OF MIDLINE UPPER ABDOMINAL ABSCESS CT-GUIDED DRAINAGE OF LEFT LOWER ABDOMINAL ABSCESS MEDICATIONS: The patient is currently admitted to the hospital and receiving intravenous antibiotics. ANESTHESIA/SEDATION: 0.5 mg IV Versed 50 mcg IV Fentanyl Moderate Sedation Time: The patient was continuously monitored during the procedure by the interventional radiology nurse under my direct supervision. COMPLICATIONS: None immediate. TECHNIQUE: Informed written consent was obtained from the patient after a thorough discussion of the procedural risks, benefits and alternatives. All questions were addressed. Maximal Sterile Barrier Technique was utilized including caps, mask, sterile gowns, sterile gloves, sterile drape, hand hygiene and skin antiseptic. A timeout was performed prior to the initiation of the procedure. PROCEDURE: Patient was placed supine on the CT scanner. Images through the abdomen were obtained. The mid upper abdomen was prepped and draped in sterile fashion. Skin was anesthetized with 1% lidocaine. 18 gauge trocar needle was directed into the complex air-fluid collection with CT guidance. No air or fluid was aspirating from the needle. Stiff Amplatz wire was advanced into the irregular collection and a Yueh catheter was advanced over the  wire. Thick green fluid was aspirated from the catheter. Amplatz wire was advanced back into the collection and the tract was dilated to accommodate a 12 [place] drain. Greater than 150 mL of thick green fluid was removed from the complex upper abdominal collection. Catheter was sutured to skin and attached to a suction bulb. Attention was directed to the fluid collection in the left lower abdomen that previously had a drain.  Left lower abdomen was prepped and draped in a sterile fashion. Skin was anesthetized with 1% lidocaine. Using CT guidance, 18 gauge trocar needle was directed into the left lower quadrant collection. Yellow purulent fluid was aspirated. Stiff Amplatz wire was advanced into the collection. Tract was dilated to accommodate a 12 [place] drain. Approximately 30 mL of purulent fluid was removed. Catheter was sutured to skin and attached to a suction bulb. Sample of both abscess collections were sent for culture. FINDINGS: Complex air-fluid collection in the mid upper abdomen. Greater than 150 mL of foul-smelling thick green fluid was removed from the upper abdominal collection. 12 French drain was placed within this upper abdominal collection. 12 French drain placed in the residual left lower abdominal collection and 30 mL of yellow purulent fluid was removed. IMPRESSION: 1. CT-guided placement of a 12 French drain in the complex mid upper abdominal abscess collection. 2. CT-guided placement of a 12 French drain in the residual left lower abdominal abscess collection. Electronically Signed   By: Markus Daft M.D.   On: 03/11/2019 15:42    Anti-infectives: Anti-infectives (From admission, onward)   Start     Dose/Rate Route Frequency Ordered Stop   03/11/19 1000  ceFEPIme (MAXIPIME) 2 g in sodium chloride 0.9 % 100 mL IVPB     2 g 200 mL/hr over 30 Minutes Intravenous Every 12 hours 03/10/19 2353     03/11/19 0900  metroNIDAZOLE (FLAGYL) IVPB 500 mg     500 mg 100 mL/hr over 60 Minutes Intravenous Every 8 hours 03/11/19 0101     03/10/19 2100  ceFEPIme (MAXIPIME) 2 g in sodium chloride 0.9 % 100 mL IVPB     2 g 200 mL/hr over 30 Minutes Intravenous  Once 03/10/19 2058 03/10/19 2219   03/10/19 2100  metroNIDAZOLE (FLAGYL) IVPB 500 mg     500 mg 100 mL/hr over 60 Minutes Intravenous  Once 03/10/19 2058 03/11/19 0114       Assessment/Plan Atrial fibrillation with rapid ventricular rate -on Eliquis    Chronic kidney disease stage III Type 2 diabetes -controlled Essential hypertension Depression Hyperlipidemia   Sepsis secondary to intra-abdominal abscess. Adenocarcinoma of the colon with chemotherapy; complete obstruction; liver metastasis. S/p laparotomy with mobilization of the splenic flexure and resection of the sigmoid, descending and distal transverse colon with a Hartman's pouch at the end of the colostomy in the right upper quadrant, 12/27/2018 Dr. [name]. -perc drains by IR yesterday for both abscesses. -upper drain CXs - MODERATE GRAM VARIABLE ROD  FEW GRAM NEGATIVE RODS -LLQ drain CX - RARE GRAM POSITIVE COCCI  FEW GRAM NEGATIVE RODS  FEW GRAM VARIABLE ROD -adv to carb mod diet -no plans for acute surgical intervention, continue with drain care.  FEN:  carb mod diet ID:  Cefepime 4/27 >> Flagyl 4/27 >>  DVT:  SCD's; heparin gtt Follow up:  Dr. [name] POC:  [place], [name] - Son - [phone]   LOS: 1 day    [name] , [facility] 03/12/2019, 9:22 AM Pager: [phone]

## 2019-03-12 NOTE — Progress Notes (Signed)
Son, [name], contacted and updated regarding her drains and plans for her abdomen.  [name] 11:20 AM 03/12/2019

## 2019-03-12 NOTE — Evaluation (Signed)
Physical Therapy Evaluation Patient Details Name: Cassandra Allen MRN: [id number] DOB: January 03, 1946 Today's Date: 03/12/2019   History of Present Illness  73 yo female recently dx with colon cancer with mets.  Had surgery.  Post op complicated by abscess.  Had drain by IR.  Grew Enterococcus.  D/c home on ABx and had drain removed 4/22.  She was noted to have enlarging abscess.  Developed abdominal pain, nausea, vomiting.  In ER had A fib with RVR.  Started on cardizem.  Became hypotensive.     Clinical Impression  Pt admitted with above diagnosis. Pt currently with functional limitations due to the deficits listed below (see PT Problem List). PTA, pt recovering at SNF, just beginning to ambulate with PT short distances. Today limited to bed mobility HR 110-165 with bed mobility, SpO2 dropped to 80 on RA, returned to 2L and returns over 90.  Pt will benefit from skilled PT to increase their independence and safety with mobility to allow discharge to the venue listed below.        Follow Up Recommendations SNF    Equipment Recommendations  None recommended by PT    Recommendations for Other Services OT consult     Precautions / Restrictions Precautions Precautions: Fall Restrictions Weight Bearing Restrictions: No      Mobility  Bed Mobility Overal bed mobility: Needs Assistance Bed Mobility: Supine to Sit     Supine to sit: Mod assist     General bed mobility comments: mod A to bring to sitting.   Transfers                    Ambulation/Gait                Stairs            Wheelchair Mobility    Modified Rankin (Stroke Patients Only)       Balance Overall balance assessment: Needs assistance   Sitting balance-Leahy Scale: Fair       Standing balance-Leahy Scale: Poor                               Pertinent Vitals/Pain Pain Assessment: Faces Faces Pain Scale: Hurts a little bit Pain Location: stomach Pain  Descriptors / Indicators: Aching Pain Intervention(s): Limited activity within patient's tolerance    Home Living Family/patient expects to be discharged to:: Skilled nursing facility                      Prior Function Level of Independence: Needs assistance         Comments: Patient just starting to walk with PT at SNF, using RW.      Hand Dominance        Extremity/Trunk Assessment   Upper Extremity Assessment Upper Extremity Assessment: Generalized weakness    Lower Extremity Assessment Lower Extremity Assessment: Generalized weakness       Communication   Communication: No difficulties  Cognition Arousal/Alertness: Awake/alert Behavior During Therapy: WFL for tasks assessed/performed Overall Cognitive Status: Within Functional Limits for tasks assessed                                        General Comments      Exercises     Assessment/Plan    PT Assessment Patient needs continued PT  services  PT Problem List Decreased strength       PT Treatment Interventions DME instruction;Gait training;Stair training;Functional mobility training;Therapeutic activities;Therapeutic exercise;Balance training    PT Goals (Current goals can be found in the Care Plan section)  Acute Rehab PT Goals Patient Stated Goal: go back to SNF PT Goal Formulation: With patient Time For Goal Achievement: 03/26/19 Potential to Achieve Goals: Good    Frequency Min 3X/week   Barriers to discharge        Co-evaluation               AM-PAC PT "6 Clicks" Mobility  Outcome Measure Help needed turning from your back to your side while in a flat bed without using bedrails?: A Little Help needed moving from lying on your back to sitting on the side of a flat bed without using bedrails?: A Little Help needed moving to and from a bed to a chair (including a wheelchair)?: A Lot Help needed standing up from a chair using your arms (e.g., wheelchair or  bedside chair)?: A Lot Help needed to walk in hospital room?: A Lot Help needed climbing 3-5 steps with a railing? : Total 6 Click Score: 13    End of Session Equipment Utilized During Treatment: Gait belt Activity Tolerance: Patient tolerated treatment well Patient left: in bed Nurse Communication: Mobility status PT Visit Diagnosis: Unsteadiness on feet (R26.81)    Time: 1630-1700 PT Time Calculation (min) (ACUTE ONLY): 30 min   Charges:   PT Evaluation $PT Eval Moderate Complexity: 1 Mod PT Treatments $Therapeutic Activity: 8-22 mins        Reinaldo Berber, PT, DPT Acute Rehabilitation Services Pager: [phone] Office: [place] 03/12/2019, 5:09 PM

## 2019-03-12 NOTE — Consult Note (Signed)
   [facility] Inpatient Consult   03/12/2019  Cassandra Allen, Cassandra Allen [id number]  We have reviewed your referral request from insurance plan, [employer], for readmission from Skilled facility.  Chart reviewed and MD notes reveals as follows from History and Physical notes:    73 yo female recently dx with colon cancer with mets.  Had surgery.  Post op complicated by abscess.  Had drain by IR.  Grew Enterococcus.  D/c home on ABx and had drain removed 4/22.  She was noted to have enlarging abscess.  Developed abdominal pain, nausea, vomiting.  In ER had A fib with RVR.  Started on cardizem.  Became hypotensive.     Will follow up for patient progress, disposition, and post hospital community follow up needs, as appropriate.  For questions, please contact:    Natividad Brood, RN BSN [place] [facility] Liaison  [phone] business mobile phone Toll free office [phone]

## 2019-03-12 NOTE — Progress Notes (Addendum)
IR rounding note via telephone per new regulations. Spoke with Shanon Brow, RN.  Patient with history of metastatic colon cancer (s/p partial colectomy and end colostomy) with secondary postoperative abscess s/p mid upper abdomen drain (labeled 1) and LLQ drain (laveled 2) placement 03/11/2019 by Dr. Anselm Pancoast.  Drain 1 (mid upper abdominal drain) site c/d/i with approximately 30 cc of thick yellow fluid in JP bulb. Drain 2 (LLQ abdominal drain) site c/d/i with approximately 30 cc of white milky fluid in JP bulb. RN reports both drains flush/aspirate without resistance.  Continue current drain management- continue with Qshift flushes/monitor of output. Plan for CT abdomen to evaluate for possible drain pull(s) when output <10 cc/day. Appreciate and agree with CCS and TRH management. IR to follow.  Cassandra Graff Nikolaus Pienta, PA-C 03/12/2019, 7:54 AM

## 2019-03-12 NOTE — Progress Notes (Signed)
[place] for Apxiaban to Heparin Indication: atrial fibrillation  Allergies  Allergen Reactions  . Metformin And Related Nausea And Vomiting  . Prednisone Nausea And Vomiting    Patient Measurements: Height: 5\' 2"  (157.5 cm) Weight: 175 lb 14.8 oz (79.8 kg) IBW/kg (Calculated) : 50.1  Vital Signs: Temp: 97.9 F (36.6 C) (04/29 1930) Temp Source: Oral (04/29 1930) BP: 109/70 (04/29 2000) Pulse Rate: 100 (04/29 2000)  Labs: Recent Labs    03/10/19 2058 03/10/19 2200  03/11/19 0238 03/11/19 0557 03/12/19 0437 03/12/19 1445 03/12/19 2048  HGB 11.1* 12.2  --   --  8.7* 9.4*  --   --   HCT 38.6 36.0  --   --  30.9* 33.2*  --   --   PLT 359  --   --   --  299 278  --   --   APTT  --   --    < > 35  --  53* 40* 60*  LABPROT  --   --   --  22.8*  --   --   --   --   INR  --   --   --  2.1*  --   --   --   --   HEPARINUNFRC  --   --   --   --   --  1.32*  --   --   CREATININE 1.09*  --   --   --  0.83 0.71  --   --   TROPONINI 0.08*  --   --  0.06* 0.05*  --   --   --    < > = values in this interval not displayed.    Estimated Creatinine Clearance: 62.2 mL/min (by C-G formula based on SCr of 0.71 mg/dL).   Medical History: Past Medical History:  Diagnosis Date  . Acute diastolic heart failure ([place]) 12/22/2018  . Arthritis   . Atrial fibrillation, chronic 12/22/2018  . Cancer of left colon ([place]) 10/30/2018  . Cancer of sigmoid colon  12/27/2018  . Diabetes mellitus without complication ([place])   . Hypertension   . Obesity (BMI 30-39.9) 12/27/2018    Assessment: 73 year old female to begin heparin for Afib post abdominal drain placements. On apixaban prior to admission for atrial fibrillation with last dose 4/27@1700 . Pharmacy consulted to start heparin while off of apixaban.  -aPTT up to 60 on 1350 units/hr    Goal of Therapy:  Heparin level 0.3-0.7 units/ml Monitor platelets by anticoagulation protocol: Yes  PTT = 66 to 102  seconds   Plan:  Increase heparin to 1450 units/hr Daily heparin, PTT (until correlating) Follow CBC and s/sx of bleeding   Hildred Laser, PharmD Clinical Pharmacist **Pharmacist phone directory can now be found on [email or web address] ([email or web address]).  Listed under [place].

## 2019-03-12 NOTE — Consult Note (Addendum)
[facility] Nurse ostomy consult note Surgical team following for assessment and plan of care to abd wound/drains.  Stoma type/location: Pt is familiar to [facility] team from several previous admissions and colostomy surgery which was performed in 2/20.  She stays in a SNF and has total assistance with pouch application and emptying. Stomal assessment/size: Stoma is red and viable, flush with skin level, 1 inch Peristomal assessment: intact skin surrounding Output: 50cc semiformed brown stool Ostomy pouching: 1pc.  Education provided:  Applied one piece flexible convex pouch with barrier ring to maintain seal.  Supplies ordered to the bedside for staff nurse use. Please re-consult if further assistance is needed.  Thank-you,  Julien Girt MSN, [place], [place], [place], [place]

## 2019-03-13 ENCOUNTER — Ambulatory Visit (HOSPITAL_COMMUNITY): Admission: RE | Admit: 2019-03-13 | Payer: Medicare HMO | Source: Ambulatory Visit

## 2019-03-13 ENCOUNTER — Inpatient Hospital Stay (HOSPITAL_COMMUNITY): Payer: Medicare HMO

## 2019-03-13 ENCOUNTER — Encounter (HOSPITAL_COMMUNITY): Payer: Self-pay

## 2019-03-13 DIAGNOSIS — Z9049 Acquired absence of other specified parts of digestive tract: Secondary | ICD-10-CM

## 2019-03-13 DIAGNOSIS — D6489 Other specified anemias: Secondary | ICD-10-CM

## 2019-03-13 DIAGNOSIS — I509 Heart failure, unspecified: Secondary | ICD-10-CM

## 2019-03-13 DIAGNOSIS — R7989 Other specified abnormal findings of blood chemistry: Secondary | ICD-10-CM

## 2019-03-13 DIAGNOSIS — E7800 Pure hypercholesterolemia, unspecified: Secondary | ICD-10-CM

## 2019-03-13 DIAGNOSIS — Z9221 Personal history of antineoplastic chemotherapy: Secondary | ICD-10-CM

## 2019-03-13 DIAGNOSIS — D509 Iron deficiency anemia, unspecified: Secondary | ICD-10-CM

## 2019-03-13 DIAGNOSIS — I110 Hypertensive heart disease with heart failure: Secondary | ICD-10-CM

## 2019-03-13 DIAGNOSIS — F329 Major depressive disorder, single episode, unspecified: Secondary | ICD-10-CM

## 2019-03-13 DIAGNOSIS — D630 Anemia in neoplastic disease: Secondary | ICD-10-CM

## 2019-03-13 DIAGNOSIS — B964 Proteus (mirabilis) (morganii) as the cause of diseases classified elsewhere: Secondary | ICD-10-CM

## 2019-03-13 LAB — CBC
HCT: 35.9 % — ABNORMAL LOW (ref 36.0–46.0)
Hemoglobin: 9.9 g/dL — ABNORMAL LOW (ref 12.0–15.0)
MCH: 26.5 pg (ref 26.0–34.0)
MCHC: 27.6 g/dL — ABNORMAL LOW (ref 30.0–36.0)
MCV: 96 fL (ref 80.0–100.0)
Platelets: 326 10*3/uL (ref 150–400)
RBC: 3.74 MIL/uL — ABNORMAL LOW (ref 3.87–5.11)
RDW: 18.1 % — ABNORMAL HIGH (ref 11.5–15.5)
WBC: 10.4 10*3/uL (ref 4.0–10.5)
nRBC: 0 % (ref 0.0–0.2)

## 2019-03-13 LAB — BASIC METABOLIC PANEL
Anion gap: 7 (ref 5–15)
BUN: 17 mg/dL (ref 8–23)
CO2: 25 mmol/L (ref 22–32)
Calcium: 7.9 mg/dL — ABNORMAL LOW (ref 8.9–10.3)
Chloride: 107 mmol/L (ref 98–111)
Creatinine, Ser: 0.71 mg/dL (ref 0.44–1.00)
GFR calc Af Amer: >60 mL/min (ref >60)
GFR calc non Af Amer: >60 mL/min (ref >60)
Glucose, Bld: 160 mg/dL — ABNORMAL HIGH (ref 70–99)
Potassium: 3.8 mmol/L (ref 3.5–5.1)
Sodium: 139 mmol/L (ref 135–145)

## 2019-03-13 LAB — CULTURE, BLOOD (ROUTINE X 2): Special Requests: ADEQUATE

## 2019-03-13 LAB — HEPARIN LEVEL (UNFRACTIONATED): Heparin Unfractionated: 0.56 IU/mL (ref 0.30–0.70)

## 2019-03-13 LAB — URINE CULTURE: Culture: NO GROWTH

## 2019-03-13 LAB — GLUCOSE, CAPILLARY
Glucose-Capillary: 143 mg/dL — ABNORMAL HIGH (ref 70–99)
Glucose-Capillary: 216 mg/dL — ABNORMAL HIGH (ref 70–99)
Glucose-Capillary: 174 mg/dL — ABNORMAL HIGH (ref 70–99)
Glucose-Capillary: 252 mg/dL — ABNORMAL HIGH (ref 70–99)

## 2019-03-13 LAB — PHOSPHORUS: Phosphorus: 3.5 mg/dL (ref 2.5–4.6)

## 2019-03-13 LAB — MAGNESIUM: Magnesium: 1.8 mg/dL (ref 1.7–2.4)

## 2019-03-13 LAB — APTT: aPTT: 73 seconds — ABNORMAL HIGH (ref 24–36)

## 2019-03-13 MED ORDER — SERTRALINE HCL 25 MG PO TABS: 125.0000 mg | ORAL_TABLET | Freq: Every day | ORAL | Status: DC

## 2019-03-13 MED ORDER — SERTRALINE HCL 25 MG PO TABS
125.0000 mg | ORAL_TABLET | Freq: Every day | ORAL | Status: DC
  Administered 2019-03-13 – 2019-03-20 (×8): 125 mg via ORAL
  Filled 2019-03-13 (×8): qty 1

## 2019-03-13 MED ORDER — DILTIAZEM HCL 60 MG PO TABS
60.0000 mg | ORAL_TABLET | Freq: Four times a day (QID) | ORAL | Status: DC
  Administered 2019-03-13 – 2019-03-14 (×4): 60 mg via ORAL
  Filled 2019-03-13 (×4): qty 1

## 2019-03-13 NOTE — Plan of Care (Signed)

## 2019-03-13 NOTE — Progress Notes (Signed)
Inpatient Diabetes Program Recommendations  AACE/ADA: New Consensus Statement on Inpatient Glycemic Control (2015)  Target Ranges:  Prepandial:   less than 140 mg/dL      Peak postprandial:   less than 180 mg/dL (1-2 hours)      Critically ill patients:  140 - 180 mg/dL   Lab Results  Component Value Date   GLUCAP 143 (H) 03/13/2019   HGBA1C 6.8 (H) 03/11/2019    Review of Glycemic Control Results for Cassandra Allen, Cassandra Allen (MRN [id number]) as of 03/13/2019 09:00  Ref. Range 03/12/2019 07:48 03/12/2019 11:31 03/12/2019 18:24 03/12/2019 22:15 03/13/2019 05:45  Glucose-Capillary Latest Ref Range: 70 - 99 mg/dL 130 (H) 156 (H) 227 (H) 241 (H) 143 (H)   Diabetes history: DM 2 Outpatient Diabetes medications:  NPH 12 units bid Current orders for Inpatient glycemic control:  Novolog sensitive tid with meals Inpatient Diabetes Program Recommendations:   May consider adding Levemir 6 units bid (1/2 of home dose of NPH).   Thanks,  Adah Perl, RN, BC-ADM Inpatient Diabetes Coordinator Pager [phone] (8a-5p)

## 2019-03-13 NOTE — Progress Notes (Signed)
Physical Therapy Treatment Patient Details Name: Cassandra Allen MRN: [id number] DOB: June 18, 1946 Today's Date: 03/13/2019    History of Present Illness 73 yo female recently dx with colon cancer with mets.  Had surgery.  Post op complicated by abscess.  Had drain by IR.  Grew Enterococcus.  D/c home on ABx and had drain removed 4/22.  She was noted to have enlarging abscess.  Developed abdominal pain, nausea, vomiting.  In ER had A fib with RVR.  Started on cardizem.  Became hypotensive.     PT Comments    With encouragement, pt agreeable to get OOB to chair.  Emphasis was on get pt up on her feet to see how she used a RW and managed taking steps and sitting tolerance.   Follow Up Recommendations  SNF     Equipment Recommendations  None recommended by PT    Recommendations for Other Services       Precautions / Restrictions Precautions Precautions: Fall    Mobility  Bed Mobility Overal bed mobility: Needs Assistance Bed Mobility: Supine to Sit     Supine to sit: Mod assist     General bed mobility comments: stability assist while pt assisted with UE's  Transfers Overall transfer level: Needs assistance   Transfers: Sit to/from Stand;Stand Pivot Transfers Sit to Stand: Min assist Stand pivot transfers: Mod assist       General transfer comment: cues for hand placement.  Stability assist and assist to maneuver the RW  Ambulation/Gait             General Gait Details: pivot only   Stairs             Wheelchair Mobility    Modified Rankin (Stroke Patients Only)       Balance Overall balance assessment: Needs assistance Sitting-balance support: Feet unsupported;No upper extremity supported Sitting balance-Leahy Scale: Fair     Standing balance support: Bilateral upper extremity supported Standing balance-Leahy Scale: Poor                              Cognition Arousal/Alertness: Awake/alert Behavior During Therapy: WFL  for tasks assessed/performed Overall Cognitive Status: Within Functional Limits for tasks assessed                                        Exercises Other Exercises Other Exercises: warm up hip/knee flex/ext ex with graded resistance in extention x10 reps each.    General Comments        Pertinent Vitals/Pain Pain Assessment: Faces Faces Pain Scale: No hurt Pain Intervention(s): Monitored during session    Home Living                      Prior Function            PT Goals (current goals can now be found in the care plan section) Acute Rehab PT Goals Patient Stated Goal: go back to SNF PT Goal Formulation: With patient Time For Goal Achievement: 03/26/19 Potential to Achieve Goals: Good Progress towards PT goals: Progressing toward goals    Frequency    Min 3X/week      PT Plan Current plan remains appropriate    Co-evaluation              AM-PAC PT "6 Clicks" Mobility  Outcome Measure  Help needed turning from your back to your side while in a flat bed without using bedrails?: A Little Help needed moving from lying on your back to sitting on the side of a flat bed without using bedrails?: A Lot Help needed moving to and from a bed to a chair (including a wheelchair)?: A Lot Help needed standing up from a chair using your arms (e.g., wheelchair or bedside chair)?: A Lot Help needed to walk in hospital room?: A Lot Help needed climbing 3-5 steps with a railing? : Total 6 Click Score: 12    End of Session   Activity Tolerance: Patient tolerated treatment well Patient left: in chair;with call bell/phone within reach Nurse Communication: Mobility status PT Visit Diagnosis: Unsteadiness on feet (R26.81)     Time: 2297-9892 PT Time Calculation (min) (ACUTE ONLY): 16 min  Charges:  $Therapeutic Activity: 8-22 mins                     03/13/2019  Cassandra Allen, PT Acute Rehabilitation Services [phone]   (pager) [phone]  (office)   Cassandra Allen 03/13/2019, 4:01 PM

## 2019-03-13 NOTE — Progress Notes (Signed)
Pharmacy Antibiotic Note  Cassandra Allen is a 73 y.o. female admitted on 03/10/2019 with intra-abdominal infection.  Pharmacy has been consulted for cefepime dosing. Also has Flagyl ordered per MD. E. Coli noted to be growing within bloodstream. Perc drain placed by IR.  Plan: -Continue cefepime 2g IV q12h -Continue Flagyl 500mg  IV q8h -Consider narrowing to ceftriaxone one abscess cultures speciate   Height: 5\' 2"  (157.5 cm) Weight: 180 lb 1.9 oz (81.7 kg) IBW/kg (Calculated) : 50.1  Temp (24hrs), Avg:98.2 F (36.8 C), Min:97.8 F (36.6 C), Max:98.5 F (36.9 C)  Recent Labs  Lab 03/10/19 2058 03/11/19 0049 03/11/19 0557 03/12/19 0437 03/13/19 0550  WBC 12.7*  --  15.2* 10.6* 10.4  CREATININE 1.09*  --  0.83 0.71 0.71  LATICACIDVEN 2.5* 3.3*  --   --   --     Estimated Creatinine Clearance: 62.9 mL/min (by C-G formula based on SCr of 0.71 mg/dL).    Allergies  Allergen Reactions  . Metformin And Related Nausea And Vomiting  . Prednisone Nausea And Vomiting   Antimicrobials: Cefepime 4/27>> Flagyl 4/27>>  Microbiology: 4/27 BCx: E coli (R-amp/unasyn) 4/28 MRSA: neg 4/28 Surgical/deep wound: gram + cocci, few gram neg rods, gram variable rods   Arrie Senate, PharmD, BCPS Clinical Pharmacist [phone] Please check AMION for all [facility] Pharmacy numbers 03/13/2019

## 2019-03-13 NOTE — TOC Initial Note (Signed)
Transition of Care [facility]) - Initial/Assessment Note    Patient Details  Name: Cassandra Allen MRN: [id number] Date of Birth: 01-Jun-1946  Transition of Care [facility]) CM/SW Contact:    Candie Chroman, LCSW Phone Number: 03/13/2019, 11:56 AM  Clinical Narrative: Readmission prevention screening complete.  CSW met with patient, introduced role, and explained that discharge planning would be discussed. Patient confirmed she is a rehab resident at [facility] and plans to return at discharge. She has been there since February. Notified her that she has used about 50/100 days and therefore in copay days. Patient stated that she had appealed her discharge from [facility] through [place] and was under the impression they were covering copays. CSW explained how [facility] typically works in the hospital setting and that she likely is still responsible for a 20% copay. SNF admissions coordinator confirmed this and will have business office call her and her son to clarify. MD unsure if patient will discharge with JP drains. No further concerns. CSW encouraged patient to contact CSW as needed. CSW will continue to follow patient for support and facilitate discharge back to SNF once medically stable.                  Expected Discharge Plan: Skilled Nursing Facility Barriers to Discharge: Continued Medical Work up, Ship broker   Patient Goals and CMS Choice   CMS Medicare.gov Compare Post Acute Care list provided to:: (Plans to return to SNF she was at prior to admission.)    Expected Discharge Plan and Services Expected Discharge Plan: [place] Choice: [place] arrangements for the past 2 months: [place]                                      Prior Living Arrangements/Services Living arrangements for the past 2 months: [place] Lives with:: Self Patient language and need for  interpreter reviewed:: No Do you feel safe going back to the place where you live?: Yes      Need for Family Participation in Patient Care: Yes (Comment) Care giver support system in place?: Yes (comment)(Will return to SNF)   Criminal Activity/Legal Involvement Pertinent to Current Situation/Hospitalization: No - Comment as needed  Activities of Daily Living Home Assistive Devices/Equipment: None ADL Screening (condition at time of admission) Patient's cognitive ability adequate to safely complete daily activities?: Yes Is the patient deaf or have difficulty hearing?: No Does the patient have difficulty seeing, even when wearing glasses/contacts?: No Does the patient have difficulty concentrating, remembering, or making decisions?: No Patient able to express need for assistance with ADLs?: Yes Does the patient have difficulty dressing or bathing?: Yes Independently performs ADLs?: No Communication: Independent Dressing (OT): Needs assistance Is this a change from baseline?: Pre-admission baseline Grooming: Needs assistance Is this a change from baseline?: Pre-admission baseline Feeding: Independent Bathing: Needs assistance Is this a change from baseline?: Pre-admission baseline Toileting: Needs assistance Is this a change from baseline?: Pre-admission baseline In/Out Bed: Needs assistance Is this a change from baseline?: Pre-admission baseline Walks in Home: Independent with device (comment) Does the patient have difficulty walking or climbing stairs?: Yes Weakness of Legs: Right Weakness of Arms/Hands: None  Permission Sought/Granted Permission sought to share information with : Facility Art therapist granted to share information with : Yes, Verbal Permission Granted  Permission granted to share info w AGENCY: [facility] SN[facility]        Emotional Assessment Appearance:: Appears stated age Attitude/Demeanor/Rapport: Engaged, Gracious Affect (typically  observed): Accepting, Appropriate, Calm, Pleasant Orientation: : Oriented to Self, Oriented to Place, Oriented to  Time, Oriented to Situation Alcohol / Substance Use: Never Used Psych Involvement: Outpatient Provider  Admission diagnosis:  Severe sepsis ([place]) [A41.9, R65.20] Patient Active Problem List   Diagnosis Date Noted  . Chronic kidney disease (CKD), stage II (mild) 03/11/2019  . Sepsis ([place]) 03/11/2019  . Abscess of abdominal cavity ([place]) 03/11/2019  . Type II diabetes mellitus with renal manifestations ([place]) 03/11/2019  . Hypokalemia 03/11/2019  . Chronic diastolic CHF (congestive heart failure) ([place]) 03/11/2019  . Abnormal LFTs 03/11/2019  . Elevated troponin 03/11/2019  . Severe sepsis ([place]) 03/11/2019  . Fever 02/25/2019  . Atrial fibrillation with RVR ([place])   . Postoperative intra-abdominal abscess 01/31/2019  . Cancer of sigmoid colon  12/27/2018  . History of adenomatous polyp of colon 12/27/2018  . 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 CKD (chronic kidney disease) stage 3, GFR 30-59 ml/min (HCC) 12/27/2018  . Obesity (BMI 30-39.9) 12/27/2018  . Cholelithiasis 12/27/2018  . Choledocholithiasis by MRCP 12/27/2018  . Status post partial colectomy Feb 2020 12/27/2018  . Colonic obstruction due to metastatic colon cancer 12/26/2018  . Anticoagulant long-term use 12/26/2018  . HLD (hyperlipidemia) 12/22/2018  . Atrial fibrillation, chronic 12/22/2018  . Depression, recurrent ([place]) 12/22/2018  . Chest pain 12/22/2018  . Port-A-Cath in place 12/04/2018  . Goals of care, counseling/discussion 11/22/2018  . GI bleed 11/02/2018  . Rectal bleed   . Cancer of splenic flexure of colon 10/30/2018  . Iron deficiency anemia 04/04/2018  . 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 Pain in right ankle and joints of right foot 01/10/2018  . Essential (primary) hypertension 03/25/2015  . Insulin  dependent diabetes mellitus ([place]) 03/25/2015   PCP:  [name], MD Pharmacy:   [place], [place] - [phone] [place] [address] Phone: [phone] Fax: [phone]     Social Determinants of Health (SDOH) Interventions    Readmission Risk Interventions Readmission Risk Prevention Plan 03/13/2019  Transportation Screening Complete  Medication Review ([place]) Complete  HRI or [place] Not Complete  HRI or Home Care Consult Pt Refusal Comments Plan for discharge to SNF.  SW Recovery Care/Counseling Consult Complete  Palliative Care Screening Not Applicable  Skilled Nursing Facility Complete  Some recent data might be hidden

## 2019-03-13 NOTE — Progress Notes (Addendum)
Patient ID: Cassandra Allen, female   DOB: [birth year], 73 y.o.   MRN: [id number]       Subjective: Patient ate some of her breakfast this morning, but would like to go home back to clear liquids as she ate spaghetti last night and it gave her epigastric pain that radiated to her back.  No nausea.  Objective: Vital signs in last 24 hours: Temp:  [97.8 F (36.6 C)-98.5 F (36.9 C)] 97.8 F (36.6 C) (04/30 0800) Pulse Rate:  [31-147] 117 (04/30 0800) Resp:  [18-35] 34 (04/30 0800) BP: (108-194)/(70-140) 136/81 (04/30 0800) SpO2:  [88 %-100 %] 100 % (04/30 0800) Weight:  [81.7 kg] 81.7 kg (04/30 0500) Last BM Date: 03/11/19  Intake/Output from previous day: 04/29 0701 - 04/30 0700 In: 1661.7 [I.V.:966.2; IV Piggyback:675.4] Out: 670 [Urine:450; Drains:170; Stool:50] Intake/Output this shift: No intake/output data recorded.  PE: Heart: tachy, irregular Lungs: CTAB Abd: soft, mild epigastric tenderness, but overall appropriate, +BS, colostomy is working well with soft brown stool present.  Both drains with 95 and 75 cc of purulent drainage yesterday.  Upper abdominal drain has an oily/greasy fatty breakdown type output mixed with the purulent output.  Lab Results:  Recent Labs    03/12/19 0437 03/13/19 0550  WBC 10.6* 10.4  HGB 9.4* 9.9*  HCT 33.2* 35.9*  PLT 278 326   BMET Recent Labs    03/12/19 0437 03/13/19 0550  NA 140 139  K 3.9 3.8  CL 104 107  CO2 27 25  GLUCOSE 136* 160*  BUN 19 17  CREATININE 0.71 0.71  CALCIUM 8.1* 7.9*   PT/INR Recent Labs    03/11/19 0238  LABPROT 22.8*  INR 2.1*   CMP     Component Value Date/Time   NA 139 03/13/2019 0550   NA 143 02/14/2019   K 3.8 03/13/2019 0550   CL 107 03/13/2019 0550   CL 101 02/14/2019   CO2 25 03/13/2019 0550   CO2 23 02/14/2019   GLUCOSE 160 (H) 03/13/2019 0550   BUN 17 03/13/2019 0550   BUN 25 (A) 02/14/2019   CREATININE 0.71 03/13/2019 0550   CREATININE 1.04 (H) 12/18/2018 1029   CALCIUM 7.9 (L) 03/13/2019 0550   CALCIUM 8.7 02/14/2019   PROT 6.0 (L) 03/10/2019 2058   PROT 5.3 (A) 01/23/2019   PROT 5.3 (A) 01/23/2019   ALBUMIN 2.5 (L) 03/10/2019 2058   ALBUMIN 2.4 01/23/2019   ALBUMIN 2.4 01/23/2019   AST 237 (H) 03/10/2019 2058   AST 60 (H) 12/18/2018 1029   ALT 95 (H) 03/10/2019 2058   ALT 117 (H) 12/18/2018 1029   ALKPHOS 1,377 (H) 03/10/2019 2058   BILITOT 2.3 (H) 03/10/2019 2058   BILITOT 0.5 12/18/2018 1029   GFRNONAA >60 03/13/2019 0550   GFRNONAA 54 (L) 12/18/2018 1029   GFRAA >60 03/13/2019 0550   GFRAA >60 12/18/2018 1029   Lipase     Component Value Date/Time   LIPASE 38 03/10/2019 2058       Studies/Results: Ct Image Guided Drainage By Percutaneous Catheter  Result Date: 03/11/2019 INDICATION: 73 year old with metastatic colon cancer and status post partial colectomy and end colostomy. History of postoperative abscess with left lower abdominal abscess drain. The drain was recently removed due to partial dislodgement. Patient needs placement of a new drainage catheter within the lower abdominal abscess. In addition, the patient has a complex air-fluid collection in the midline of the upper abdomen. This is a concerning for complex abscess collection. EXAM: CT  GUIDED DRAINAGE OF MIDLINE UPPER ABDOMINAL ABSCESS CT-GUIDED DRAINAGE OF LEFT LOWER ABDOMINAL ABSCESS MEDICATIONS: The patient is currently admitted to the hospital and receiving intravenous antibiotics. ANESTHESIA/SEDATION: 0.5 mg IV Versed 50 mcg IV Fentanyl Moderate Sedation Time: The patient was continuously monitored during the procedure by the interventional radiology nurse under my direct supervision. COMPLICATIONS: None immediate. TECHNIQUE: Informed written consent was obtained from the patient after a thorough discussion of the procedural risks, benefits and alternatives. All questions were addressed. Maximal Sterile Barrier Technique was utilized including caps, mask, sterile gowns,  sterile gloves, sterile drape, hand hygiene and skin antiseptic. A timeout was performed prior to the initiation of the procedure. PROCEDURE: Patient was placed supine on the CT scanner. Images through the abdomen were obtained. The mid upper abdomen was prepped and draped in sterile fashion. Skin was anesthetized with 1% lidocaine. 18 gauge trocar needle was directed into the complex air-fluid collection with CT guidance. No air or fluid was aspirating from the needle. Stiff Amplatz wire was advanced into the irregular collection and a Yueh catheter was advanced over the wire. Thick green fluid was aspirated from the catheter. Amplatz wire was advanced back into the collection and the tract was dilated to accommodate a 12 [place] drain. Greater than 150 mL of thick green fluid was removed from the complex upper abdominal collection. Catheter was sutured to skin and attached to a suction bulb. Attention was directed to the fluid collection in the left lower abdomen that previously had a drain. Left lower abdomen was prepped and draped in a sterile fashion. Skin was anesthetized with 1% lidocaine. Using CT guidance, 18 gauge trocar needle was directed into the left lower quadrant collection. Yellow purulent fluid was aspirated. Stiff Amplatz wire was advanced into the collection. Tract was dilated to accommodate a 12 [place] drain. Approximately 30 mL of purulent fluid was removed. Catheter was sutured to skin and attached to a suction bulb. Sample of both abscess collections were sent for culture. FINDINGS: Complex air-fluid collection in the mid upper abdomen. Greater than 150 mL of foul-smelling thick green fluid was removed from the upper abdominal collection. 12 French drain was placed within this upper abdominal collection. 12 French drain placed in the residual left lower abdominal collection and 30 mL of yellow purulent fluid was removed. IMPRESSION: 1. CT-guided placement of a 12 French drain in the complex  mid upper abdominal abscess collection. 2. CT-guided placement of a 12 French drain in the residual left lower abdominal abscess collection. Electronically Signed   By: Markus Daft M.D.   On: 03/11/2019 15:42   Ct Image Guided Drainage By Percutaneous Catheter  Result Date: 03/11/2019 INDICATION: 73 year old with metastatic colon cancer and status post partial colectomy and end colostomy. History of postoperative abscess with left lower abdominal abscess drain. The drain was recently removed due to partial dislodgement. Patient needs placement of a new drainage catheter within the lower abdominal abscess. In addition, the patient has a complex air-fluid collection in the midline of the upper abdomen. This is a concerning for complex abscess collection. EXAM: CT GUIDED DRAINAGE OF MIDLINE UPPER ABDOMINAL ABSCESS CT-GUIDED DRAINAGE OF LEFT LOWER ABDOMINAL ABSCESS MEDICATIONS: The patient is currently admitted to the hospital and receiving intravenous antibiotics. ANESTHESIA/SEDATION: 0.5 mg IV Versed 50 mcg IV Fentanyl Moderate Sedation Time: The patient was continuously monitored during the procedure by the interventional radiology nurse under my direct supervision. COMPLICATIONS: None immediate. TECHNIQUE: Informed written consent was obtained from the patient after a thorough  discussion of the procedural risks, benefits and alternatives. All questions were addressed. Maximal Sterile Barrier Technique was utilized including caps, mask, sterile gowns, sterile gloves, sterile drape, hand hygiene and skin antiseptic. A timeout was performed prior to the initiation of the procedure. PROCEDURE: Patient was placed supine on the CT scanner. Images through the abdomen were obtained. The mid upper abdomen was prepped and draped in sterile fashion. Skin was anesthetized with 1% lidocaine. 18 gauge trocar needle was directed into the complex air-fluid collection with CT guidance. No air or fluid was aspirating from the  needle. Stiff Amplatz wire was advanced into the irregular collection and a Yueh catheter was advanced over the wire. Thick green fluid was aspirated from the catheter. Amplatz wire was advanced back into the collection and the tract was dilated to accommodate a 12 [place] drain. Greater than 150 mL of thick green fluid was removed from the complex upper abdominal collection. Catheter was sutured to skin and attached to a suction bulb. Attention was directed to the fluid collection in the left lower abdomen that previously had a drain. Left lower abdomen was prepped and draped in a sterile fashion. Skin was anesthetized with 1% lidocaine. Using CT guidance, 18 gauge trocar needle was directed into the left lower quadrant collection. Yellow purulent fluid was aspirated. Stiff Amplatz wire was advanced into the collection. Tract was dilated to accommodate a 12 [place] drain. Approximately 30 mL of purulent fluid was removed. Catheter was sutured to skin and attached to a suction bulb. Sample of both abscess collections were sent for culture. FINDINGS: Complex air-fluid collection in the mid upper abdomen. Greater than 150 mL of foul-smelling thick green fluid was removed from the upper abdominal collection. 12 French drain was placed within this upper abdominal collection. 12 French drain placed in the residual left lower abdominal collection and 30 mL of yellow purulent fluid was removed. IMPRESSION: 1. CT-guided placement of a 12 French drain in the complex mid upper abdominal abscess collection. 2. CT-guided placement of a 12 French drain in the residual left lower abdominal abscess collection. Electronically Signed   By: Markus Daft M.D.   On: 03/11/2019 15:42    Anti-infectives: Anti-infectives (From admission, onward)   Start     Dose/Rate Route Frequency Ordered Stop   03/11/19 1000  ceFEPIme (MAXIPIME) 2 g in sodium chloride 0.9 % 100 mL IVPB     2 g 200 mL/hr over 30 Minutes Intravenous Every 12 hours  03/10/19 2353     03/11/19 0900  metroNIDAZOLE (FLAGYL) IVPB 500 mg     500 mg 100 mL/hr over 60 Minutes Intravenous Every 8 hours 03/11/19 0101     03/10/19 2100  ceFEPIme (MAXIPIME) 2 g in sodium chloride 0.9 % 100 mL IVPB     2 g 200 mL/hr over 30 Minutes Intravenous  Once 03/10/19 2058 03/10/19 2219   03/10/19 2100  metroNIDAZOLE (FLAGYL) IVPB 500 mg     500 mg 100 mL/hr over 60 Minutes Intravenous  Once 03/10/19 2058 03/11/19 0114       Assessment/Plan Atrial fibrillation with rapid ventricular rate-on Eliquis  Chronic kidney disease stage III Type 2 diabetes-controlled Essential hypertension Depression Hyperlipidemia   Sepsissecondary to intra-abdominal abscess. Adenocarcinoma of the colon with chemotherapy; complete obstruction; liver metastasis. S/plaparotomy with mobilization of the splenic flexure and resection of the sigmoid, descending and distal transverse colon with a Hartman's pouch at the end of the colostomy in the right upper quadrant,12/27/2018 Dr. [name]. -perc  drains by IR 4/28 for both abscesses. -upper drain CXs - MODERATE GRAM VARIABLE ROD  FEW GRAM NEGATIVE RODS -LLQ drain CX - RARE GRAM POSITIVE COCCI  FEW GRAM NEGATIVE RODS  FEW GRAM VARIABLE ROD -patient complains of epigastric pain today that goes through to her back.  Epigastric collection is large and compresses the stomach some, so she may not be able to eat quite as well right now.   -patient requests diet drop back to clears.  This is fine.  She may have whatever type diet she tolerates. -drain is putting out some oily/greasy output.  In reviewing her scan this is the collection that was noted to likely have some type of fatty tissue present and this output is likely explained by adipose breakdown.  Doubt anything more significant currently as her WBC is normal and she is AF. -cont with drain care.  No surgical intervention warranted  FEN: CLD ID: Cefepime 4/27 >>Flagyl 4/27  >> DVT: SCD's; heparin gtt Follow up: Dr. [name] POC: [place], Impastato [name] - Son - [phone]   LOS: 2 days    [name] , [facility] Surgery 03/13/2019, 8:40 AM Pager: [phone]

## 2019-03-13 NOTE — Progress Notes (Signed)
IR rounding note via telephone per new regulations.  Patient with history of metastatic colon cancer (s/p partial colectomy and end colostomy) with secondary postoperative abscess s/p mid upper abdomen drain (labeled 1) and LLQ drain (laveled 2) placement 03/11/2019 by Dr. Anselm Pancoast.  Drains intact, sites clean. RN reports both drains flush/aspirate without resistance.  Continue current drain management- continue with Qshift flushes/monitor of output. Plan for CT abdomen to evaluate for possible drain pull(s) when output <10 cc/day. Appreciate and agree with [facility] and [facility] management. IR to follow.  Ascencion Dike PA-C Interventional Radiology 03/13/2019 10:02 AM

## 2019-03-13 NOTE — Progress Notes (Signed)
[place] for Apxiaban to Heparin Indication: atrial fibrillation  Allergies  Allergen Reactions  . Metformin And Related Nausea And Vomiting  . Prednisone Nausea And Vomiting    Patient Measurements: Height: 5\' 2"  (157.5 cm) Weight: 180 lb 1.9 oz (81.7 kg) IBW/kg (Calculated) : 50.1  Vital Signs: Temp: 97.8 F (36.6 C) (04/30 0800) Temp Source: Oral (04/30 0800) BP: 136/81 (04/30 0800) Pulse Rate: 117 (04/30 0800)  Labs: Recent Labs    03/10/19 2058  03/11/19 0238 03/11/19 0557 03/12/19 0437 03/12/19 1445 03/12/19 2048 03/13/19 0550  HGB 11.1*   < >  --  8.7* 9.4*  --   --  9.9*  HCT 38.6   < >  --  30.9* 33.2*  --   --  35.9*  PLT 359  --   --  299 278  --   --  326  APTT  --    < > 35  --  53* 40* 60* 73*  LABPROT  --   --  22.8*  --   --   --   --   --   INR  --   --  2.1*  --   --   --   --   --   HEPARINUNFRC  --   --   --   --  1.32*  --   --  0.56  CREATININE 1.09*  --   --  0.83 0.71  --   --  0.71  TROPONINI 0.08*  --  0.06* 0.05*  --   --   --   --    < > = values in this interval not displayed.    Estimated Creatinine Clearance: 62.9 mL/min (by C-G formula based on SCr of 0.71 mg/dL).   Medical History: Past Medical History:  Diagnosis Date  . Acute diastolic heart failure ([place]) 12/22/2018  . Arthritis   . Atrial fibrillation, chronic 12/22/2018  . Cancer of left colon ([place]) 10/30/2018  . Cancer of sigmoid colon  12/27/2018  . Diabetes mellitus without complication ([place])   . Hypertension   . Obesity (BMI 30-39.9) 12/27/2018    Assessment: 33 yoF on apixaban at home for AFib start on IV heparin with need for IR procedures. Heparin level remains falsely elevated due to recent DOAC due, aPTT is therapeutic, CBC stable.   Goal of Therapy:  Heparin level 0.3-0.7 units/ml Monitor platelets by anticoagulation protocol: Yes  PTT = 66 to 102 seconds   Plan:  -Continue heparin 1450 units/hr -Transition back to  apixaban once all procedures completed -Daily heparin level and CBC   Arrie Senate, PharmD, BCPS Clinical Pharmacist [phone] Please check AMION for all [place] numbers 03/13/2019

## 2019-03-13 NOTE — Progress Notes (Signed)
PROGRESS NOTE    Cassandra Allen  WUJ:[id number] DOB: [birth year] DOA: 03/10/2019 PCP: [name], MD   Brief Narrative:  73 y.o. WF PMHx HTN, HLD, [name], diabetes type 2 uncontrolled with complication, chronic diastolic CHF, atrial fibrillation on Eliquis, metastatic colon cancer (s/p of partial colectomy, chemotherapy), s/p of colostomy, CKD stage III, depression,   Presents with abdominal pain. Patient was recently hospitalized from 3/20-3/29 due to post operation abdominal abscess. She had perc drain placement 01/31/19. Pt was also discharged to nursing home on Augmentin for 7 days.    Patient states that she developed worsening abdominal pain today, which is diffuse, worse on upper abdomen, constant, 10 out of 10 in severity, sharp, radiating to the back.  It is associated with a fever and chills.  She also has nausea vomiting several times.  No diarrhea. Patient denies chest pain, cough, shortness of breath.  No symptoms of UTI or unilateral weakness.     ED Course: pt was found to have WBC 12.7, lactic acid 2.5, troponin 0.08, BNP 359.3, procalcitonin 4.38, potassium 3.2, renal function close to baseline, temperature 100.7, oxygen saturation 90 to 99% on room air, respiration rate 30s. Previous CT 03/05/19 showed partial decrease in size to 19.4cm.  Repeat CT shows interval removal of LLQ drain with slight increase in size of complex collection now 20.2 cm.  There is also a persistent complex gas/fluid/fat attenutation LUQ 13 cm collection which persists persists on all scans. Pt is admitted to SDU as inpt. IR, Dr. Jarvis Newcomer was consulted by EDP.   Pt has A fib with RVR with HR upto 170s in ED. Cardizem gtt was started in ED, HR improved to 130s, but Blood pressure dropped to SBP 84. Pt received 2L of Ringer solution in  ED. cardizem gtt was d/c'ed. And another 500 cc of NS will be given. PCCM was consulted. Dr. Oletta Darter recommended to start phenylephrine IV.    Subjective: 4/30 A/O x4,  negative CP, positive S OB, positive abdominal pain which states has resolved at this point (was just administered pain medication).  States increased abdominal pain was postprandial so she has placed herself back on liquid diet.    Assessment & Plan:   Principal Problem:   Abscess of abdominal cavity ([place]) Active Problems:   Essential (primary) hypertension   HLD (hyperlipidemia)   Depression, recurrent (HCC)   Anticoagulant long-term use   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   CKD (chronic kidney disease) stage 3, GFR 30-59 ml/min (HCC)   Atrial fibrillation with RVR (HCC)   Chronic kidney disease (CKD), stage II (mild)   Sepsis (HCC)   Type II diabetes mellitus with renal manifestations (HCC)   Hypokalemia   Chronic diastolic CHF (congestive heart failure) (HCC)   Abnormal LFTs   Elevated troponin   Severe sepsis (HCC)  Sepsis due to abscess of abdominal cavity - Continue antibiotics per surgery - S/plaparotomy with mobilization of the splenic flexure and resection of the sigmoid, descending and distal transverse colon with a Hartman's pouch at the end of the colostomy in the right upper quadrant, on prior admission - 4/29 s/p IR drainage of abscess middle abdomen and LLQ abdomen with replacement of drains.   A. fib with RVR -Currently NSR.  However patient going in and out of A. fib with RVR, with HR as high as 160 - 4/30 increase Cardizem 60 mg QID if patient tolerates overnight will place her back on her home dose medication of Cardizem  CD 240 mg daily  - continue PRN metoprolol   Essential (primary) HTN -Resolved  HLD (hyperlipidemia): -Obtain CMP and if liver enzymes WNL resume Zocor. 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 Burr Medico oncology clinic that patient had been admitted   CKD (chronic kidney  disease) stage 3, GFR 30-59 ml/min [facility]): Recent Labs  Lab 03/10/19 2058 03/11/19 0557 03/12/19 0437 03/13/19 0550  CREATININE 1.09* 0.83 0.71 0.71  - Creatinine WNL   Chronic diastolic CHF - Strict in and out +726.31ml -Daily weight Filed Weights   03/11/19 0344 03/12/19 0446 03/13/19 0500  Weight: 77.7 kg 79.8 kg 81.7 kg  -  2D echo on 12/23/2018 showed EF of 60-65%.   -Currently negative sign of acute exacerbation   Atrial fibrillation with RVR ([place]): - 4/30 hold Eliquis overnight and restart in a.m. if patient remains stable.  Continue heparin - Currently A. fib.  Diabetes type 2 controlled with complication - 3/50 hemoglobin A1c= 6.8 - Sensitive SSI.  Patient only on liquid diet.  Hypokalemia -Potassium goal> 4   Abnormal LFTs  -Multifactorial metastatic colon cancer to liver, sepsis   Elevated troponin:  -Troponin 0.08, no chest pain.  Likely due to demand ischemia secondary to sepsis and atrial fibrillation with RVR. - cycle CE q6 x3 and repeat EKG in the am  Recent Labs  Lab 03/10/19 2058 03/11/19 0238 03/11/19 0557  TROPONINI 0.08* 0.06* 0.05*       DVT prophylaxis: Heparin Code Status: Full Family Communication: None Disposition Plan: TBD    Consultants:  IR  CCS     Procedures/Significant Events:  12/27/2018 Dr. [name] S/plaparotomy with mobilization of the splenic flexure and resection of the sigmoid, descending and distal transverse colon with a Hartman's pouch at the end of the colostomy in the right upper quadrant, 2/14 surgical pathology adenocarcinoma of proximal descending colon, sigmoid mass consistent with large villous adenoma 3/20 abdominal CT from SNF: Large rim-enhancing fluid connection with multiple foci over the lower abdomen with a likely intra-abdominal abscess She was readmitted and under IR drain 01/31/19, Dr. Earleen Newport.  She was transitioned to oral abx and discharged again on 02/09/19.  CT 01/31/19 : 12.6 x 24.6 x 18.4  cm enhancing fluid collection lower abdomen extending to the mid left upper abdomen, likely intra-abdominal abscess IR drain placement 01/31/2019 CT 03/05/2019: Abscess cavity measured 18.1 x 15 x 3.3 cm.  Previously this measured 12.6 x 24.6 x 18.4 drain was positioned within the left side of the abscess cavity abscess did not extend centrally toward midline.  There is also suspected upper abscess measuring 13.8 x 9.4 improved from prior measurement of 16.6 x 8.4 CT 03/08/2018 showed the prior drain have been removed.  There is a persistent fluid collection left lower abdomen extending in central pelvis collection measured 20.2 x 3.9 with mildly increased size from 19.4 x 2.3 on the last scan.  No new fluid collections.  Caudate lobe of the liver shows metastases without significant interval change.  No new or progressive metastatic disease was identified.  Stable left pleural effusion and left lower lobe atelectasis. Chest x-ray showed: 1. Persistent small to moderate left pleural effusion with associated left basilar opacity. 2. Cardiomegaly with underlying mild diffuse pulmonary interstitial edema. 3. Aortic atherosclerosis. 4/29 CT guided drainage intra-abdominal abscess collection:-150 ml green purulent looking fluid  removed from the complex mid upper collection after 12 Fr drain placement -30 ml of yellow purulent fluid removed from left lower abscess collection after placement of 12 Fr drain.     I have personally reviewed and interpreted all radiology studies and my findings are as above.  VENTILATOR SETTINGS:    Cultures 4/28 MRSA by PCR negative 4/28 abscess drainage LLQ incubated for better growth 4/28 abscess drainage middle upper abdomen reintubated for better growth    Antimicrobials: Anti-infectives (From admission, onward)   Start     Stop   03/11/19 1000  ceFEPIme (MAXIPIME) 2 g in sodium chloride 0.9 % 100 mL IVPB         03/11/19 0900  metroNIDAZOLE (FLAGYL) IVPB 500 mg          03/10/19 2100  ceFEPIme (MAXIPIME) 2 g in sodium chloride 0.9 % 100 mL IVPB     03/10/19 2219   03/10/19 2100  metroNIDAZOLE (FLAGYL) IVPB 500 mg     03/11/19 0114       Devices    LINES / TUBES:  12 French drain mid upper abdomen 4/29>> 12 French drain left lower abdomen 4/29>>     Continuous Infusions:  sodium chloride 75 mL/hr at 03/12/19 0315   sodium chloride 75 mL/hr at 03/13/19 0500   ceFEPime (MAXIPIME) IV 2 g (03/12/19 2241)   heparin 1,450 Units/hr (03/13/19 0500)   metronidazole 500 mg (03/13/19 0029)   phenylephrine (NEO-SYNEPHRINE) Adult infusion       Objective: Vitals:   03/13/19 0100 03/13/19 0200 03/13/19 0400 03/13/19 0500  BP: (!) 140/96 134/79 139/89 110/72  Pulse: (!) 108 90 94 (!) 105  Resp: (!) 30 (!) 29 (!) 26 18  Temp:   98.4 F (36.9 C)   TempSrc:   Oral   SpO2: 100% 95% 100% 100%  Weight:    81.7 kg  Height:        Intake/Output Summary (Last 24 hours) at 03/13/2019 0751 Last data filed at 03/13/2019 0500 Gross per 24 hour  Intake 1661.65 ml  Output 670 ml  Net 991.65 ml   Filed Weights   03/11/19 0344 03/12/19 0446 03/13/19 0500  Weight: 77.7 kg 79.8 kg 81.7 kg   General: A/O x4, positive acute respiratory distress (feeling of S OB although SPO2= 100%) Eyes: negative scleral hemorrhage, negative anisocoria, negative icterus ENT: Negative Runny nose, negative gingival bleeding, Neck:  Negative scars, masses, torticollis, lymphadenopathy, JVD Lungs: Decreased breath sounds diffusely (may be secondary to patient's body habitus), negative wheezes or crackles Cardiovascular: Irregular irregular rhythm and rate, without murmur gallop or rub normal S1 and S2 Abdomen: Morbidly obese, positive abdominal pain LLQ>>RLQ, nondistended, positive soft, bowel sounds, no rebound, no ascites, no appreciable mass, JP drain x2 in place RLQ/LLQ draining mostly straw-colored fluid.  Ostomy bag in place negative sign of infection or leak  around the seal Extremities: No significant cyanosis, clubbing, or edema bilateral lower extremities Skin: Negative rashes, lesions, ulcers Psychiatric:  Negative depression, negative anxiety, negative fatigue, negative mania  Central nervous system:  Cranial nerves II through XII intact, tongue/uvula midline, all extremities muscle strength 5/5, sensation intact throughout, negative dysarthria, negative expressive aphasia, negative receptive aphasia.       Data Reviewed: Care during the described time interval was provided by me .  I have reviewed this patient's available data, including medical history, events of note, physical examination, and all test results as part of my evaluation.   CBC: Recent Labs  Lab 03/10/19 2058 03/10/19 2200 03/11/19 0557 03/12/19 0437 03/13/19 0550  WBC 12.7*  --  15.2* 10.6* 10.4  NEUTROABS 11.8*  --   --   --   --   HGB 11.1* 12.2 8.7* 9.4* 9.9*  HCT 38.6 36.0 30.9* 33.2* 35.9*  MCV 91.9  --  94.5 95.1 96.0  PLT 359  --  299 278 462   Basic Metabolic Panel: Recent Labs  Lab 03/10/19 2058 03/10/19 2200 03/11/19 0238 03/11/19 0557 03/12/19 0437 03/13/19 0550  NA 141 142  --  141 140 139  K 3.1* 3.2*  --  3.7 3.9 3.8  CL 102  --   --  104 104 107  CO2 31  --   --  27 27 25   GLUCOSE 174*  --   --  114* 136* 160*  BUN 20  --   --  20 19 17   CREATININE 1.09*  --   --  0.83 0.71 0.71  CALCIUM 8.7*  --   --  8.0* 8.1* 7.9*  MG  --   --  1.1*  --  2.2 1.8  PHOS  --   --   --  4.0 3.7 3.5   GFR: Estimated Creatinine Clearance: 62.9 mL/min (by C-G formula based on SCr of 0.71 mg/dL). Liver Function Tests: Recent Labs  Lab 03/10/19 2058  AST 237*  ALT 95*  ALKPHOS 1,377*  BILITOT 2.3*  PROT 6.0*  ALBUMIN 2.5*   Recent Labs  Lab 03/10/19 2058  LIPASE 38   No results for input(s): AMMONIA in the last 168 hours. Coagulation Profile: Recent Labs  Lab 03/11/19 0238  INR 2.1*   Cardiac Enzymes: Recent Labs  Lab 03/10/19 2058  03/11/19 0238 03/11/19 0557  TROPONINI 0.08* 0.06* 0.05*   BNP (last 3 results) No results for input(s): PROBNP in the last 8760 hours. HbA1C: Recent Labs    03/11/19 0238  HGBA1C 6.8*   CBG: Recent Labs  Lab 03/12/19 0748 03/12/19 1131 03/12/19 1824 03/12/19 2215 03/13/19 0545  GLUCAP 130* 156* 227* 241* 143*   Lipid Profile: Recent Labs    03/11/19 0238  CHOL 91  HDL 23*  LDLCALC 52  TRIG 78  CHOLHDL 4.0   Thyroid Function Tests: No results for input(s): TSH, T4TOTAL, FREET4, T3FREE, THYROIDAB in the last 72 hours. Anemia Panel: No results for input(s): VITAMINB12, FOLATE, FERRITIN, TIBC, IRON, RETICCTPCT in the last 72 hours. Urine analysis:    Component Value Date/Time   COLORURINE AMBER (A) 03/10/2019 0041   APPEARANCEUR CLEAR 03/10/2019 0041   LABSPEC 1.032 (H) 03/10/2019 0041   PHURINE 5.0 03/10/2019 0041   GLUCOSEU NEGATIVE 03/10/2019 0041   HGBUR SMALL (A) 03/10/2019 0041   BILIRUBINUR NEGATIVE 03/10/2019 0041   KETONESUR NEGATIVE 03/10/2019 0041   PROTEINUR 30 (A) 03/10/2019 0041   UROBILINOGEN 0.2 03/25/2015 2317   NITRITE POSITIVE (A) 03/10/2019 0041   LEUKOCYTESUR MODERATE (A) 03/10/2019 0041   Sepsis Labs: @LABRCNTIP (procalcitonin:4,lacticidven:4)  ) Recent Results (from the past 240 hour(s))  Blood Culture (routine x 2)     Status: Abnormal   Collection Time: 03/10/19  9:37 PM  Result Value Ref Range Status   Specimen Description BLOOD RIGHT HAND  Final   Special Requests   Final    BOTTLES DRAWN AEROBIC AND ANAEROBIC Blood Culture results may not be optimal due to an inadequate volume of blood received in culture bottles   Culture  Setup Time   Final    GRAM  NEGATIVE RODS IN BOTH AEROBIC AND ANAEROBIC BOTTLES CRITICAL RESULT CALLED TO, READ BACK BY AND VERIFIED WITH: Cristopher Estimable PharmD 11:10 03/11/19 ([email or web address]) Performed at [facility], [place] [address] [place], [place] [address]    Culture ESCHERICHIA COLI (A)  Final    Report Status 03/13/2019 FINAL  Final   Organism ID, Bacteria ESCHERICHIA COLI  Final      Susceptibility   Escherichia coli - MIC*    AMPICILLIN >=32 RESISTANT Resistant     CEFAZOLIN <=4 SENSITIVE Sensitive     CEFEPIME <=1 SENSITIVE Sensitive     CEFTAZIDIME <=1 SENSITIVE Sensitive     CEFTRIAXONE <=1 SENSITIVE Sensitive     CIPROFLOXACIN <=0.25 SENSITIVE Sensitive     GENTAMICIN <=1 SENSITIVE Sensitive     IMIPENEM <=0.25 SENSITIVE Sensitive     TRIMETH/SULFA <=20 SENSITIVE Sensitive     AMPICILLIN/SULBACTAM >=32 RESISTANT Resistant     PIP/TAZO <=4 SENSITIVE Sensitive     Extended ESBL NEGATIVE Sensitive     * ESCHERICHIA COLI  Blood Culture ID Panel (Reflexed)     Status: Abnormal   Collection Time: 03/10/19  9:37 PM  Result Value Ref Range Status   Enterococcus species NOT DETECTED NOT DETECTED Final   Listeria monocytogenes NOT DETECTED NOT DETECTED Final   Staphylococcus species NOT DETECTED NOT DETECTED Final   Staphylococcus aureus (BCID) NOT DETECTED NOT DETECTED Final   Streptococcus species NOT DETECTED NOT DETECTED Final   Streptococcus agalactiae NOT DETECTED NOT DETECTED Final   Streptococcus pneumoniae NOT DETECTED NOT DETECTED Final   Streptococcus pyogenes NOT DETECTED NOT DETECTED Final   Acinetobacter baumannii NOT DETECTED NOT DETECTED Final   Enterobacteriaceae species DETECTED (A) NOT DETECTED Final    Comment: Enterobacteriaceae represent a large family of gram-negative bacteria, not a single organism. CRITICAL RESULT CALLED TO, READ BACK BY AND VERIFIED WITH: Cristopher Estimable PharmD 11:10 03/11/19 ([email or web address])    Enterobacter cloacae complex NOT DETECTED NOT DETECTED Final   Escherichia coli DETECTED (A) NOT DETECTED Final    Comment: CRITICAL RESULT CALLED TO, READ BACK BY AND VERIFIED WITH: Cristopher Estimable PharmD 11:10 03/11/19 ([email or web address])    Klebsiella oxytoca NOT DETECTED NOT DETECTED Final   Klebsiella pneumoniae NOT DETECTED NOT DETECTED Final   Proteus  species NOT DETECTED NOT DETECTED Final   Serratia marcescens NOT DETECTED NOT DETECTED Final   Carbapenem resistance NOT DETECTED NOT DETECTED Final   Haemophilus influenzae NOT DETECTED NOT DETECTED Final   Neisseria meningitidis NOT DETECTED NOT DETECTED Final   Pseudomonas aeruginosa NOT DETECTED NOT DETECTED Final   Candida albicans NOT DETECTED NOT DETECTED Final   Candida glabrata NOT DETECTED NOT DETECTED Final   Candida krusei NOT DETECTED NOT DETECTED Final   Candida parapsilosis NOT DETECTED NOT DETECTED Final   Candida tropicalis NOT DETECTED NOT DETECTED Final    Comment: Performed at [facility], [place] [address] [place], [place] [address]  Blood Culture (routine x 2)     Status: Abnormal   Collection Time: 03/10/19  9:50 PM  Result Value Ref Range Status   Specimen Description BLOOD LEFT ARM  Final   Special Requests   Final    BOTTLES DRAWN AEROBIC AND ANAEROBIC Blood Culture adequate volume   Culture  Setup Time   Final    GRAM NEGATIVE RODS IN BOTH AEROBIC AND ANAEROBIC BOTTLES CRITICAL VALUE NOTED.  VALUE IS CONSISTENT WITH PREVIOUSLY REPORTED AND CALLED VALUE.    Culture (A)  Final  ESCHERICHIA COLI SUSCEPTIBILITIES PERFORMED ON PREVIOUS CULTURE WITHIN THE LAST 5 DAYS. Performed at [facility], [place] [address], [place], [place] [address]    Report Status 03/13/2019 FINAL  Final  MRSA PCR Screening     Status: None   Collection Time: 03/11/19  3:47 AM  Result Value Ref Range Status   MRSA by PCR NEGATIVE NEGATIVE Final    Comment:        The GeneXpert MRSA Assay (FDA approved for NASAL specimens only), is one component of a comprehensive MRSA colonization surveillance program. It is not intended to diagnose MRSA infection nor to guide or monitor treatment for MRSA infections. Performed at [facility], [place] [address] [place] [address], [place], [place] [address]   Aerobic/Anaerobic Culture (surgical/deep wound)     Status: None (Preliminary result)    Collection Time: 03/11/19  3:00 PM  Result Value Ref Range Status   Specimen Description ABSCESS DRAINAGE  Final   Special Requests MID UPPER ABDOMEN  Final   Gram Stain   Final    ABUNDANT WBC PRESENT,BOTH PMN AND MONONUCLEAR MODERATE GRAM VARIABLE ROD FEW GRAM NEGATIVE RODS    Culture   Final    CULTURE REINCUBATED FOR BETTER GROWTH Performed at [facility], [place] [address] [place], [place] [address]    Report Status PENDING  Incomplete  Aerobic/Anaerobic Culture (surgical/deep wound)     Status: None (Preliminary result)   Collection Time: 03/11/19  3:00 PM  Result Value Ref Range Status   Specimen Description ABSCESS DRAINAGE  Final   Special Requests LEFT LOWER ABDOMEN  Final   Gram Stain   Final    ABUNDANT WBC PRESENT,BOTH PMN AND MONONUCLEAR RARE GRAM POSITIVE COCCI FEW GRAM NEGATIVE RODS FEW GRAM VARIABLE ROD    Culture   Final    CULTURE REINCUBATED FOR BETTER GROWTH Performed at [facility], [place] [address] [place], [place] [address]    Report Status PENDING  Incomplete         Radiology Studies: Ct Image Guided Drainage By Percutaneous Catheter  Result Date: 03/11/2019 INDICATION: 73 year old with metastatic colon cancer and status post partial colectomy and end colostomy. History of postoperative abscess with left lower abdominal abscess drain. The drain was recently removed due to partial dislodgement. Patient needs placement of a new drainage catheter within the lower abdominal abscess. In addition, the patient has a complex air-fluid collection in the midline of the upper abdomen. This is a concerning for complex abscess collection. EXAM: CT GUIDED DRAINAGE OF MIDLINE UPPER ABDOMINAL ABSCESS CT-GUIDED DRAINAGE OF LEFT LOWER ABDOMINAL ABSCESS MEDICATIONS: The patient is currently admitted to the hospital and receiving intravenous antibiotics. ANESTHESIA/SEDATION: 0.5 mg IV Versed 50 mcg IV Fentanyl Moderate Sedation Time: The patient was  continuously monitored during the procedure by the interventional radiology nurse under my direct supervision. COMPLICATIONS: None immediate. TECHNIQUE: Informed written consent was obtained from the patient after a thorough discussion of the procedural risks, benefits and alternatives. All questions were addressed. Maximal Sterile Barrier Technique was utilized including caps, mask, sterile gowns, sterile gloves, sterile drape, hand hygiene and skin antiseptic. A timeout was performed prior to the initiation of the procedure. PROCEDURE: Patient was placed supine on the CT scanner. Images through the abdomen were obtained. The mid upper abdomen was prepped and draped in sterile fashion. Skin was anesthetized with 1% lidocaine. 18 gauge trocar needle was directed into the complex air-fluid collection with CT guidance. No air or fluid was  aspirating from the needle. Stiff Amplatz wire was advanced into the irregular collection and a Yueh catheter was advanced over the wire. Thick green fluid was aspirated from the catheter. Amplatz wire was advanced back into the collection and the tract was dilated to accommodate a 12 [place] drain. Greater than 150 mL of thick green fluid was removed from the complex upper abdominal collection. Catheter was sutured to skin and attached to a suction bulb. Attention was directed to the fluid collection in the left lower abdomen that previously had a drain. Left lower abdomen was prepped and draped in a sterile fashion. Skin was anesthetized with 1% lidocaine. Using CT guidance, 18 gauge trocar needle was directed into the left lower quadrant collection. Yellow purulent fluid was aspirated. Stiff Amplatz wire was advanced into the collection. Tract was dilated to accommodate a 12 [place] drain. Approximately 30 mL of purulent fluid was removed. Catheter was sutured to skin and attached to a suction bulb. Sample of both abscess collections were sent for culture. FINDINGS: Complex  air-fluid collection in the mid upper abdomen. Greater than 150 mL of foul-smelling thick green fluid was removed from the upper abdominal collection. 12 French drain was placed within this upper abdominal collection. 12 French drain placed in the residual left lower abdominal collection and 30 mL of yellow purulent fluid was removed. IMPRESSION: 1. CT-guided placement of a 12 French drain in the complex mid upper abdominal abscess collection. 2. CT-guided placement of a 12 French drain in the residual left lower abdominal abscess collection. Electronically Signed   By: Markus Daft M.D.   On: 03/11/2019 15:42   Ct Image Guided Drainage By Percutaneous Catheter  Result Date: 03/11/2019 INDICATION: 73 year old with metastatic colon cancer and status post partial colectomy and end colostomy. History of postoperative abscess with left lower abdominal abscess drain. The drain was recently removed due to partial dislodgement. Patient needs placement of a new drainage catheter within the lower abdominal abscess. In addition, the patient has a complex air-fluid collection in the midline of the upper abdomen. This is a concerning for complex abscess collection. EXAM: CT GUIDED DRAINAGE OF MIDLINE UPPER ABDOMINAL ABSCESS CT-GUIDED DRAINAGE OF LEFT LOWER ABDOMINAL ABSCESS MEDICATIONS: The patient is currently admitted to the hospital and receiving intravenous antibiotics. ANESTHESIA/SEDATION: 0.5 mg IV Versed 50 mcg IV Fentanyl Moderate Sedation Time: The patient was continuously monitored during the procedure by the interventional radiology nurse under my direct supervision. COMPLICATIONS: None immediate. TECHNIQUE: Informed written consent was obtained from the patient after a thorough discussion of the procedural risks, benefits and alternatives. All questions were addressed. Maximal Sterile Barrier Technique was utilized including caps, mask, sterile gowns, sterile gloves, sterile drape, hand hygiene and skin  antiseptic. A timeout was performed prior to the initiation of the procedure. PROCEDURE: Patient was placed supine on the CT scanner. Images through the abdomen were obtained. The mid upper abdomen was prepped and draped in sterile fashion. Skin was anesthetized with 1% lidocaine. 18 gauge trocar needle was directed into the complex air-fluid collection with CT guidance. No air or fluid was aspirating from the needle. Stiff Amplatz wire was advanced into the irregular collection and a Yueh catheter was advanced over the wire. Thick green fluid was aspirated from the catheter. Amplatz wire was advanced back into the collection and the tract was dilated to accommodate a 12 [place] drain. Greater than 150 mL of thick green fluid was removed from the complex upper abdominal collection. Catheter was sutured to skin and  attached to a suction bulb. Attention was directed to the fluid collection in the left lower abdomen that previously had a drain. Left lower abdomen was prepped and draped in a sterile fashion. Skin was anesthetized with 1% lidocaine. Using CT guidance, 18 gauge trocar needle was directed into the left lower quadrant collection. Yellow purulent fluid was aspirated. Stiff Amplatz wire was advanced into the collection. Tract was dilated to accommodate a 12 [place] drain. Approximately 30 mL of purulent fluid was removed. Catheter was sutured to skin and attached to a suction bulb. Sample of both abscess collections were sent for culture. FINDINGS: Complex air-fluid collection in the mid upper abdomen. Greater than 150 mL of foul-smelling thick green fluid was removed from the upper abdominal collection. 12 French drain was placed within this upper abdominal collection. 12 French drain placed in the residual left lower abdominal collection and 30 mL of yellow purulent fluid was removed. IMPRESSION: 1. CT-guided placement of a 12 French drain in the complex mid upper abdominal abscess collection. 2. CT-guided  placement of a 12 French drain in the residual left lower abdominal abscess collection. Electronically Signed   By: Markus Daft M.D.   On: 03/11/2019 15:42        Scheduled Meds:  diltiazem  30 mg Oral Q6H   insulin aspart  0-9 Units Subcutaneous TID WC   pantoprazole (PROTONIX) IV  40 mg Intravenous QHS   sodium chloride flush  5 mL Intracatheter Q8H   Continuous Infusions:  sodium chloride 75 mL/hr at 03/12/19 0315   sodium chloride 75 mL/hr at 03/13/19 0500   ceFEPime (MAXIPIME) IV 2 g (03/12/19 2241)   heparin 1,450 Units/hr (03/13/19 0500)   metronidazole 500 mg (03/13/19 0029)   phenylephrine (NEO-SYNEPHRINE) Adult infusion       LOS: 2 days   The patient is critically ill with multiple organ systems failure and requires high complexity decision making for assessment and support, frequent evaluation and titration of therapies, application of advanced monitoring technologies and extensive interpretation of multiple databases. Critical Care Time devoted to patient care services described in this note  Time spent: 40 minutes     Markeise Mathews, Geraldo Docker, MD [facility] Pager [phone]  If 7PM-7AM, please contact night-coverage [email or web address] Password [facility] 03/13/2019, 7:51 AM

## 2019-03-13 NOTE — NC FL2 (Addendum)
[place] LEVEL OF CARE SCREENING TOOL     IDENTIFICATION  Patient Name: Cassandra Allen Birthdate: 1946/01/20 Sex: female Admission Date (Current Location): 03/10/2019  [facility] [place] Number:  Monia Pouch)   Facility and Address:  [facility] [place], [place] [address] [place], [place] [address]      Provider Number: [id number]  Attending Physician Name and Address:  Allie Bossier, MD  Relative Name and Phone Number:       Current Level of Care: Hospital Recommended Level of Care: [place] Prior Approval Number:    Date Approved/Denied:   PASRR Number: [id number]  Discharge Plan: SNF    Current Diagnoses: Patient Active Problem List   Diagnosis Date Noted  . Chronic kidney disease (CKD), stage II (mild) 03/11/2019  . Sepsis ([place]) 03/11/2019  . Abscess of abdominal cavity ([place]) 03/11/2019  . Type II diabetes mellitus with renal manifestations ([place]) 03/11/2019  . Hypokalemia 03/11/2019  . Chronic diastolic CHF (congestive heart failure) ([place]) 03/11/2019  . Abnormal LFTs 03/11/2019  . Elevated troponin 03/11/2019  . Severe sepsis ([place]) 03/11/2019  . Fever 02/25/2019  . Atrial fibrillation with RVR ([place])   . Postoperative intra-abdominal abscess 01/31/2019  . Cancer of sigmoid colon  12/27/2018  . History of adenomatous polyp of colon 12/27/2018  . 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 CKD (chronic kidney disease) stage 3, GFR 30-59 ml/min (HCC) 12/27/2018  . Obesity (BMI 30-39.9) 12/27/2018  . Cholelithiasis 12/27/2018  . Choledocholithiasis by MRCP 12/27/2018  . Status post partial colectomy Feb 2020 12/27/2018  . Colonic obstruction due to metastatic colon cancer 12/26/2018  . Anticoagulant long-term use 12/26/2018  . HLD (hyperlipidemia) 12/22/2018  . Atrial fibrillation, chronic 12/22/2018  . Depression, recurrent ([place]) 12/22/2018  . Chest pain 12/22/2018  . Port-A-Cath in  place 12/04/2018  . Goals of care, counseling/discussion 11/22/2018  . GI bleed 11/02/2018  . Rectal bleed   . Cancer of splenic flexure of colon 10/30/2018  . Iron deficiency anemia 04/04/2018  . 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 Pain in right ankle and joints of right foot 01/10/2018  . Essential (primary) hypertension 03/25/2015  . Insulin dependent diabetes mellitus ([place]) 03/25/2015    Orientation RESPIRATION BLADDER Height & Weight     Self, Time, Situation, Place  Normal Incontinent, External catheter Weight: 180 lb 1.9 oz (81.7 kg) Height:  5\' 2"  (157.5 cm)  BEHAVIORAL SYMPTOMS/MOOD NEUROLOGICAL BOWEL NUTRITION STATUS  (None) (None) Colostomy Diet(Currently clear liquid. Follow for diet on discharge summary.)  AMBULATORY STATUS COMMUNICATION OF NEEDS Skin   Extensive Assist Verbally Bruising, Other (Comment), Surgical wounds(Catheter entry/exit, MASD.)                       Personal Care Assistance Level of Assistance              Functional Limitations Info  Sight, Hearing, Speech Sight Info: Adequate Hearing Info: Adequate Speech Info: Adequate    SPECIAL CARE FACTORS FREQUENCY  Blood pressure, PT (By licensed PT)     PT Frequency: 5 x week              Contractures Contractures Info: Not present    Additional Factors Info  Psychotropic Code Status Info: Full Allergies Info: Metformin and related, Prednisone Psychotropic Info: Depression: Zoloft 125 mg PO daily.         Current Medications (  03/13/2019):  This is the current hospital active medication list Current Facility-Administered Medications  Medication Dose Route Frequency Provider Last Rate Last Dose  . 0.9 %  sodium chloride infusion   Intravenous Continuous Ivor Costa, MD 75 mL/hr at 03/13/19 1126    . 0.9 %  sodium chloride infusion   Intravenous PRN Corey Harold, NP 75 mL/hr at 03/13/19 0500    . ceFEPIme (MAXIPIME)  2 g in sodium chloride 0.9 % 100 mL IVPB  2 g Intravenous Q12H Ivor Costa, MD 200 mL/hr at 03/13/19 0846 2 g at 03/13/19 0846  . diltiazem (CARDIZEM) tablet 60 mg  60 mg Oral Q6H Allie Bossier, MD   60 mg at 03/13/19 1126  . heparin ADULT infusion 100 units/mL (25000 units/252mL sodium chloride 0.45%)  1,450 Units/hr Intravenous Continuous Kris Mouton, RPH 14.5 mL/hr at 03/13/19 0500 1,450 Units/hr at 03/13/19 0500  . insulin aspart (novoLOG) injection 0-9 Units  0-9 Units Subcutaneous TID WC Ivor Costa, MD   3 Units at 03/13/19 1127  . metoprolol tartrate (LOPRESSOR) injection 5 mg  5 mg Intravenous Q6H PRN Allie Bossier, MD   5 mg at 03/12/19 1336  . metroNIDAZOLE (FLAGYL) IVPB 500 mg  500 mg Intravenous Cleophas Dunker, MD 100 mL/hr at 03/13/19 0843 500 mg at 03/13/19 0843  . morphine 2 MG/ML injection 0.5-1 mg  0.5-1 mg Intravenous Q4H PRN Rigoberto Noel, MD   1 mg at 03/13/19 0900  . ondansetron (ZOFRAN) tablet 4 mg  4 mg Oral Q6H PRN Ivor Costa, MD       Or  . ondansetron [facility]) injection 4 mg  4 mg Intravenous Q6H PRN Ivor Costa, MD      . pantoprazole (PROTONIX) injection 40 mg  40 mg Intravenous QHS Corey Harold, NP   40 mg at 03/12/19 2215  . sertraline (ZOLOFT) tablet 125 mg  125 mg Oral Daily Allie Bossier, MD   125 mg at 03/13/19 1126  . sodium chloride flush (NS) 0.9 % injection 10-40 mL  10-40 mL Intracatheter PRN Ivor Costa, MD      . sodium chloride flush (NS) 0.9 % injection 5 mL  5 mL Intracatheter Q8H Markus Daft, MD   5 mL at 03/13/19 7121     Discharge Medications: Please see discharge summary for a list of discharge medications.  Relevant Imaging Results:  Relevant Lab Results:   Additional Information SS#: [id number]. Has been at [facility] about [facility] days. Currently has two JP drains. Unsure if she will discharge with them or not. Still has a lot of drainage.  Candie Chroman, LCSW

## 2019-03-13 NOTE — Progress Notes (Signed)
Cassandra Allen   DOB:01-08-1946   DG#:[id number]   VZD#:[id number]  Medical oncology followup note   Subjective: Patient is well-known to me, has been under my care for her metastatic colon cancer.  I last saw her in the hospital when she had hemicolectomy.  Her post op course has been complicated with recurrent abdominal abscess.  She was readmitted 2 days ago, and had a 2 new JP drain placed.  Abdominal pain is controlled, she was sitting in the bed when I saw her, vital signs stable, afebrile, she is on clear liquid diet.    Objective:  Vitals:   03/13/19 0900 03/13/19 1113  BP: 121/69 138/89  Pulse: 99 (!) 105  Resp: (!) 32 (!) 25  Temp:  97.7 F (36.5 C)  SpO2: 100% 95%    Body mass index is 32.94 kg/m.  Intake/Output Summary (Last 24 hours) at 03/13/2019 1337 Last data filed at 03/13/2019 1113 Gross per 24 hour  Intake 1701.22 ml  Output 1732.5 ml  Net -31.28 ml     Sclerae unicteric  Oropharynx clear  No peripheral adenopathy  Lungs clear -- no rales or rhonchi  Heart regular rate and rhythm  Abdomen soft, previous surgical scar has healed, she has two JP drain tube in place   MSK no focal spinal tenderness, no peripheral edema  Neuro nonfocal    CBG (last 3)  Recent Labs    03/12/19 2215 03/13/19 0545 03/13/19 1119  GLUCAP 241* 143* 216*     Labs:  Urine Studies No results for input(s): UHGB, CRYS in the last 72 hours.  Invalid input(s): UACOL, UAPR, USPG, UPH, UTP, UGL, UKET, UBIL, UNIT, UROB, ULEU, UEPI, UWBC, URBC, UBAC, CAST, [place], [place]  Basic Metabolic Panel: Recent Labs  Lab 03/10/19 2058 03/10/19 2200 03/11/19 0238 03/11/19 0557 03/12/19 0437 03/13/19 0550  NA 141 142  --  141 140 139  K 3.1* 3.2*  --  3.7 3.9 3.8  CL 102  --   --  104 104 107  CO2 31  --   --  27 27 25   GLUCOSE 174*  --   --  114* 136* 160*  BUN 20  --   --  20 19 17   CREATININE 1.09*  --   --  0.83 0.71 0.71  CALCIUM 8.7*  --   --  8.0* 8.1* 7.9*  MG  --   --   1.1*  --  2.2 1.8  PHOS  --   --   --  4.0 3.7 3.5   GFR Estimated Creatinine Clearance: 62.9 mL/min (by C-G formula based on SCr of 0.71 mg/dL). Liver Function Tests: Recent Labs  Lab 03/10/19 2058  AST 237*  ALT 95*  ALKPHOS 1,377*  BILITOT 2.3*  PROT 6.0*  ALBUMIN 2.5*   Recent Labs  Lab 03/10/19 2058  LIPASE 38   No results for input(s): AMMONIA in the last 168 hours. Coagulation profile Recent Labs  Lab 03/11/19 0238  INR 2.1*    CBC: Recent Labs  Lab 03/10/19 2058 03/10/19 2200 03/11/19 0557 03/12/19 0437 03/13/19 0550  WBC 12.7*  --  15.2* 10.6* 10.4  NEUTROABS 11.8*  --   --   --   --   HGB 11.1* 12.2 8.7* 9.4* 9.9*  HCT 38.6 36.0 30.9* 33.2* 35.9*  MCV 91.9  --  94.5 95.1 96.0  PLT 359  --  299 278 326   Cardiac Enzymes: Recent Labs  Lab 03/10/19 2058 03/11/19  0238 03/11/19 0557  TROPONINI 0.08* 0.06* 0.05*   BNP: Invalid input(s): POCBNP CBG: Recent Labs  Lab 03/12/19 1131 03/12/19 1824 03/12/19 2215 03/13/19 0545 03/13/19 1119  GLUCAP 156* 227* 241* 143* 216*   D-Dimer No results for input(s): DDIMER in the last 72 hours. Hgb A1c Recent Labs    03/11/19 0238  HGBA1C 6.8*   Lipid Profile Recent Labs    03/11/19 0238  CHOL 91  HDL 23*  LDLCALC 52  TRIG 78  CHOLHDL 4.0   Thyroid function studies No results for input(s): TSH, T4TOTAL, T3FREE, THYROIDAB in the last 72 hours.  Invalid input(s): FREET3 Anemia work up No results for input(s): VITAMINB12, FOLATE, FERRITIN, TIBC, IRON, RETICCTPCT in the last 72 hours. Microbiology Recent Results (from the past 240 hour(s))  Blood Culture (routine x 2)     Status: Abnormal   Collection Time: 03/10/19  9:37 PM  Result Value Ref Range Status   Specimen Description BLOOD RIGHT HAND  Final   Special Requests   Final    BOTTLES DRAWN AEROBIC AND ANAEROBIC Blood Culture results may not be optimal due to an inadequate volume of blood received in culture bottles   Culture  Setup  Time   Final    GRAM NEGATIVE RODS IN BOTH AEROBIC AND ANAEROBIC BOTTLES CRITICAL RESULT CALLED TO, READ BACK BY AND VERIFIED WITH: Cristopher Estimable PharmD 11:10 03/11/19 ([email or web address]) Performed at [facility], [place] [address], [place], [place] [address]    Culture ESCHERICHIA COLI (A)  Final   Report Status 03/13/2019 FINAL  Final   Organism ID, Bacteria ESCHERICHIA COLI  Final      Susceptibility   Escherichia coli - MIC*    AMPICILLIN >=32 RESISTANT Resistant     CEFAZOLIN <=4 SENSITIVE Sensitive     CEFEPIME <=1 SENSITIVE Sensitive     CEFTAZIDIME <=1 SENSITIVE Sensitive     CEFTRIAXONE <=1 SENSITIVE Sensitive     CIPROFLOXACIN <=0.25 SENSITIVE Sensitive     GENTAMICIN <=1 SENSITIVE Sensitive     IMIPENEM <=0.25 SENSITIVE Sensitive     TRIMETH/SULFA <=20 SENSITIVE Sensitive     AMPICILLIN/SULBACTAM >=32 RESISTANT Resistant     PIP/TAZO <=4 SENSITIVE Sensitive     Extended ESBL NEGATIVE Sensitive     * ESCHERICHIA COLI  Blood Culture ID Panel (Reflexed)     Status: Abnormal   Collection Time: 03/10/19  9:37 PM  Result Value Ref Range Status   Enterococcus species NOT DETECTED NOT DETECTED Final   Listeria monocytogenes NOT DETECTED NOT DETECTED Final   Staphylococcus species NOT DETECTED NOT DETECTED Final   Staphylococcus aureus (BCID) NOT DETECTED NOT DETECTED Final   Streptococcus species NOT DETECTED NOT DETECTED Final   Streptococcus agalactiae NOT DETECTED NOT DETECTED Final   Streptococcus pneumoniae NOT DETECTED NOT DETECTED Final   Streptococcus pyogenes NOT DETECTED NOT DETECTED Final   Acinetobacter baumannii NOT DETECTED NOT DETECTED Final   Enterobacteriaceae species DETECTED (A) NOT DETECTED Final    Comment: Enterobacteriaceae represent a large family of gram-negative bacteria, not a single organism. CRITICAL RESULT CALLED TO, READ BACK BY AND VERIFIED WITH: Cristopher Estimable PharmD 11:10 03/11/19 ([email or web address])    Enterobacter cloacae complex NOT DETECTED NOT DETECTED  Final   Escherichia coli DETECTED (A) NOT DETECTED Final    Comment: CRITICAL RESULT CALLED TO, READ BACK BY AND VERIFIED WITH: Cristopher Estimable PharmD 11:10 03/11/19 ([email or web address])    Klebsiella oxytoca NOT DETECTED NOT DETECTED Final   Klebsiella pneumoniae NOT DETECTED  NOT DETECTED Final   Proteus species NOT DETECTED NOT DETECTED Final   Serratia marcescens NOT DETECTED NOT DETECTED Final   Carbapenem resistance NOT DETECTED NOT DETECTED Final   Haemophilus influenzae NOT DETECTED NOT DETECTED Final   Neisseria meningitidis NOT DETECTED NOT DETECTED Final   Pseudomonas aeruginosa NOT DETECTED NOT DETECTED Final   Candida albicans NOT DETECTED NOT DETECTED Final   Candida glabrata NOT DETECTED NOT DETECTED Final   Candida krusei NOT DETECTED NOT DETECTED Final   Candida parapsilosis NOT DETECTED NOT DETECTED Final   Candida tropicalis NOT DETECTED NOT DETECTED Final    Comment: Performed at [facility], [place] [address], [place], [place] [address]  Blood Culture (routine x 2)     Status: Abnormal   Collection Time: 03/10/19  9:50 PM  Result Value Ref Range Status   Specimen Description BLOOD LEFT ARM  Final   Special Requests   Final    BOTTLES DRAWN AEROBIC AND ANAEROBIC Blood Culture adequate volume   Culture  Setup Time   Final    GRAM NEGATIVE RODS IN BOTH AEROBIC AND ANAEROBIC BOTTLES CRITICAL VALUE NOTED.  VALUE IS CONSISTENT WITH PREVIOUSLY REPORTED AND CALLED VALUE.    Culture (A)  Final    ESCHERICHIA COLI SUSCEPTIBILITIES PERFORMED ON PREVIOUS CULTURE WITHIN THE LAST 5 DAYS. Performed at [facility], [place] [address] [place], [place] [address]    Report Status 03/13/2019 FINAL  Final  MRSA PCR Screening     Status: None   Collection Time: 03/11/19  3:47 AM  Result Value Ref Range Status   MRSA by PCR NEGATIVE NEGATIVE Final    Comment:        The GeneXpert MRSA Assay (FDA approved for NASAL specimens only), is one component of a comprehensive MRSA  colonization surveillance program. It is not intended to diagnose MRSA infection nor to guide or monitor treatment for MRSA infections. Performed at [facility], [place] [address], [place], [place] [address]   Aerobic/Anaerobic Culture (surgical/deep wound)     Status: None (Preliminary result)   Collection Time: 03/11/19  3:00 PM  Result Value Ref Range Status   Specimen Description ABSCESS DRAINAGE  Final   Special Requests MID UPPER ABDOMEN  Final   Gram Stain   Final    ABUNDANT WBC PRESENT,BOTH PMN AND MONONUCLEAR MODERATE GRAM VARIABLE ROD FEW GRAM NEGATIVE RODS    Culture   Final    MODERATE PROTEUS MIRABILIS SUSCEPTIBILITIES TO FOLLOW HOLDING FOR POSSIBLE ANAEROBE Performed at [facility], [place] [address], [place], [place] [address]    Report Status PENDING  Incomplete  Aerobic/Anaerobic Culture (surgical/deep wound)     Status: None (Preliminary result)   Collection Time: 03/11/19  3:00 PM  Result Value Ref Range Status   Specimen Description ABSCESS DRAINAGE  Final   Special Requests LEFT LOWER ABDOMEN  Final   Gram Stain   Final    ABUNDANT WBC PRESENT,BOTH PMN AND MONONUCLEAR RARE GRAM POSITIVE COCCI FEW GRAM NEGATIVE RODS FEW GRAM VARIABLE ROD    Culture   Final    MODERATE PROTEUS MIRABILIS SUSCEPTIBILITIES TO FOLLOW HOLDING FOR POSSIBLE ANAEROBE Performed at [facility], [place] [address] [place],  [address]    Report Status PENDING  Incomplete      Studies:  Dg Chest Port 1 View  Result Date: 03/13/2019 CLINICAL DATA:  Shortness of breath. EXAM: PORTABLE CHEST 1 VIEW COMPARISON:  Chest x-rays dated  03/10/2019, 02/05/2019 and 12/31/2018 FINDINGS: Power port in place in good position with the tip at the cavoatrial junction. Moderate left pleural effusion has slightly increased. Overall heart size and pulmonary vascularity are normal. Right lung is clear. No acute bone abnormality. Aortic atherosclerosis. Abscess drainage catheter  in the left mid abdomen. IMPRESSION: Slightly increased moderate left pleural effusion. Aortic Atherosclerosis (ICD10-I70.0). Electronically Signed   By: Lorriane Shire M.D.   On: 03/13/2019 11:51   Ct Image Guided Drainage By Percutaneous Catheter  Result Date: 03/11/2019 INDICATION: 73 year old with metastatic colon cancer and status post partial colectomy and end colostomy. History of postoperative abscess with left lower abdominal abscess drain. The drain was recently removed due to partial dislodgement. Patient needs placement of a new drainage catheter within the lower abdominal abscess. In addition, the patient has a complex air-fluid collection in the midline of the upper abdomen. This is a concerning for complex abscess collection. EXAM: CT GUIDED DRAINAGE OF MIDLINE UPPER ABDOMINAL ABSCESS CT-GUIDED DRAINAGE OF LEFT LOWER ABDOMINAL ABSCESS MEDICATIONS: The patient is currently admitted to the hospital and receiving intravenous antibiotics. ANESTHESIA/SEDATION: 0.5 mg IV Versed 50 mcg IV Fentanyl Moderate Sedation Time: The patient was continuously monitored during the procedure by the interventional radiology nurse under my direct supervision. COMPLICATIONS: None immediate. TECHNIQUE: Informed written consent was obtained from the patient after a thorough discussion of the procedural risks, benefits and alternatives. All questions were addressed. Maximal Sterile Barrier Technique was utilized including caps, mask, sterile gowns, sterile gloves, sterile drape, hand hygiene and skin antiseptic. A timeout was performed prior to the initiation of the procedure. PROCEDURE: Patient was placed supine on the CT scanner. Images through the abdomen were obtained. The mid upper abdomen was prepped and draped in sterile fashion. Skin was anesthetized with 1% lidocaine. 18 gauge trocar needle was directed into the complex air-fluid collection with CT guidance. No air or fluid was aspirating from the needle. Stiff  Amplatz wire was advanced into the irregular collection and a Yueh catheter was advanced over the wire. Thick green fluid was aspirated from the catheter. Amplatz wire was advanced back into the collection and the tract was dilated to accommodate a 12 [place] drain. Greater than 150 mL of thick green fluid was removed from the complex upper abdominal collection. Catheter was sutured to skin and attached to a suction bulb. Attention was directed to the fluid collection in the left lower abdomen that previously had a drain. Left lower abdomen was prepped and draped in a sterile fashion. Skin was anesthetized with 1% lidocaine. Using CT guidance, 18 gauge trocar needle was directed into the left lower quadrant collection. Yellow purulent fluid was aspirated. Stiff Amplatz wire was advanced into the collection. Tract was dilated to accommodate a 12 [place] drain. Approximately 30 mL of purulent fluid was removed. Catheter was sutured to skin and attached to a suction bulb. Sample of both abscess collections were sent for culture. FINDINGS: Complex air-fluid collection in the mid upper abdomen. Greater than 150 mL of foul-smelling thick green fluid was removed from the upper abdominal collection. 12 French drain was placed within this upper abdominal collection. 12 French drain placed in the residual left lower abdominal collection and 30 mL of yellow purulent fluid was removed. IMPRESSION: 1. CT-guided placement of a 12 French drain in the complex mid upper abdominal abscess collection. 2. CT-guided placement of a 12 French drain in the residual left lower abdominal abscess collection. Electronically Signed   By: Scherrie Gerlach.D.  On: 03/11/2019 15:42   Ct Image Guided Drainage By Percutaneous Catheter  Result Date: 03/11/2019 INDICATION: 73 year old with metastatic colon cancer and status post partial colectomy and end colostomy. History of postoperative abscess with left lower abdominal abscess drain. The drain was  recently removed due to partial dislodgement. Patient needs placement of a new drainage catheter within the lower abdominal abscess. In addition, the patient has a complex air-fluid collection in the midline of the upper abdomen. This is a concerning for complex abscess collection. EXAM: CT GUIDED DRAINAGE OF MIDLINE UPPER ABDOMINAL ABSCESS CT-GUIDED DRAINAGE OF LEFT LOWER ABDOMINAL ABSCESS MEDICATIONS: The patient is currently admitted to the hospital and receiving intravenous antibiotics. ANESTHESIA/SEDATION: 0.5 mg IV Versed 50 mcg IV Fentanyl Moderate Sedation Time: The patient was continuously monitored during the procedure by the interventional radiology nurse under my direct supervision. COMPLICATIONS: None immediate. TECHNIQUE: Informed written consent was obtained from the patient after a thorough discussion of the procedural risks, benefits and alternatives. All questions were addressed. Maximal Sterile Barrier Technique was utilized including caps, mask, sterile gowns, sterile gloves, sterile drape, hand hygiene and skin antiseptic. A timeout was performed prior to the initiation of the procedure. PROCEDURE: Patient was placed supine on the CT scanner. Images through the abdomen were obtained. The mid upper abdomen was prepped and draped in sterile fashion. Skin was anesthetized with 1% lidocaine. 18 gauge trocar needle was directed into the complex air-fluid collection with CT guidance. No air or fluid was aspirating from the needle. Stiff Amplatz wire was advanced into the irregular collection and a Yueh catheter was advanced over the wire. Thick green fluid was aspirated from the catheter. Amplatz wire was advanced back into the collection and the tract was dilated to accommodate a 12 [place] drain. Greater than 150 mL of thick green fluid was removed from the complex upper abdominal collection. Catheter was sutured to skin and attached to a suction bulb. Attention was directed to the fluid collection  in the left lower abdomen that previously had a drain. Left lower abdomen was prepped and draped in a sterile fashion. Skin was anesthetized with 1% lidocaine. Using CT guidance, 18 gauge trocar needle was directed into the left lower quadrant collection. Yellow purulent fluid was aspirated. Stiff Amplatz wire was advanced into the collection. Tract was dilated to accommodate a 12 [place] drain. Approximately 30 mL of purulent fluid was removed. Catheter was sutured to skin and attached to a suction bulb. Sample of both abscess collections were sent for culture. FINDINGS: Complex air-fluid collection in the mid upper abdomen. Greater than 150 mL of foul-smelling thick green fluid was removed from the upper abdominal collection. 12 French drain was placed within this upper abdominal collection. 12 French drain placed in the residual left lower abdominal collection and 30 mL of yellow purulent fluid was removed. IMPRESSION: 1. CT-guided placement of a 12 French drain in the complex mid upper abdominal abscess collection. 2. CT-guided placement of a 12 French drain in the residual left lower abdominal abscess collection. Electronically Signed   By: Markus Daft M.D.   On: 03/11/2019 15:42    Assessment: 73 y.o. with left side colon cancer and presumed oligo liver metastasis, s/p chemo and left hemicolectomy on 12/27/2018 due to bowel obstruction   1.  Abdominal abscess, status post 2 drainage tubes placement  2.  Metastatic left colon cancer to liver chemo and on December 27, 2018 3. AF with RVR, rate controlled now 4. HTN 5. Depression  6. Anemia  secondary to surgery, cancer and infection, and iron deficiency  7. Chronic CHF, stable    Plan:  -I reviewed her most recent abdominal CT scan from April 28 which showed stable 3.9 cm mass in the dome of her liver, no other evidence of metastasis in the abdomen.  I discussed results with patient. A biopsy of the liver lesion previously was negative, will continue  monitoring. -Continue abscess drainage and IV antibiotics, per surgery and primary team -She previously required frequent blood transfusion and IV iron before colon surgery, and has not required any blood transfusion since surgery 2 months ago.  I will repeat her iron study to see if she needs iv iron  -I encouraged patient to focus on infection control, and rehabilitation.  No role for chemotherapy at this point.  I plan to repeat a PET scan when she recovers well from infection, to re-evaluate her evidence of metastasis  -I will f/u as need, and set up her clinic follow up appointment upon her discharge.    Truitt Merle, MD 03/13/2019  1:37 PM

## 2019-03-14 ENCOUNTER — Inpatient Hospital Stay (HOSPITAL_COMMUNITY): Payer: Medicare HMO

## 2019-03-14 DIAGNOSIS — R0602 Shortness of breath: Secondary | ICD-10-CM

## 2019-03-14 DIAGNOSIS — J90 Pleural effusion, not elsewhere classified: Secondary | ICD-10-CM | POA: Diagnosis present

## 2019-03-14 LAB — MAGNESIUM: Magnesium: 1.7 mg/dL (ref 1.7–2.4)

## 2019-03-14 LAB — GLUCOSE, CAPILLARY
Glucose-Capillary: 253 mg/dL — ABNORMAL HIGH (ref 70–99)
Glucose-Capillary: 130 mg/dL — ABNORMAL HIGH (ref 70–99)
Glucose-Capillary: 184 mg/dL — ABNORMAL HIGH (ref 70–99)
Glucose-Capillary: 262 mg/dL — ABNORMAL HIGH (ref 70–99)

## 2019-03-14 LAB — HEPARIN LEVEL (UNFRACTIONATED): Heparin Unfractionated: 0.29 IU/mL — ABNORMAL LOW (ref 0.30–0.70)

## 2019-03-14 LAB — CBC
HCT: 35.4 % — ABNORMAL LOW (ref 36.0–46.0)
Hemoglobin: 9.7 g/dL — ABNORMAL LOW (ref 12.0–15.0)
MCH: 26.3 pg (ref 26.0–34.0)
MCHC: 27.4 g/dL — ABNORMAL LOW (ref 30.0–36.0)
MCV: 95.9 fL (ref 80.0–100.0)
Platelets: 350 10*3/uL (ref 150–400)
RBC: 3.69 MIL/uL — ABNORMAL LOW (ref 3.87–5.11)
RDW: 17.7 % — ABNORMAL HIGH (ref 11.5–15.5)
WBC: 11.4 10*3/uL — ABNORMAL HIGH (ref 4.0–10.5)
nRBC: 0 % (ref 0.0–0.2)

## 2019-03-14 LAB — IRON AND TIBC
Iron: 100 ug/dL (ref 28–170)
Saturation Ratios: 60 % — ABNORMAL HIGH (ref 10.4–31.8)
TIBC: 167 ug/dL — ABNORMAL LOW (ref 250–450)
UIBC: 67 ug/dL

## 2019-03-14 LAB — COMPREHENSIVE METABOLIC PANEL
ALT: 42 U/L (ref 0–44)
AST: 12 U/L — ABNORMAL LOW (ref 15–41)
Albumin: 2.1 g/dL — ABNORMAL LOW (ref 3.5–5.0)
Alkaline Phosphatase: 528 U/L — ABNORMAL HIGH (ref 38–126)
Anion gap: 7 (ref 5–15)
BUN: 12 mg/dL (ref 8–23)
CO2: 24 mmol/L (ref 22–32)
Calcium: 8 mg/dL — ABNORMAL LOW (ref 8.9–10.3)
Chloride: 108 mmol/L (ref 98–111)
Creatinine, Ser: 0.74 mg/dL (ref 0.44–1.00)
GFR calc Af Amer: >60 mL/min (ref >60)
GFR calc non Af Amer: >60 mL/min (ref >60)
Glucose, Bld: 241 mg/dL — ABNORMAL HIGH (ref 70–99)
Potassium: 3.5 mmol/L (ref 3.5–5.1)
Sodium: 139 mmol/L (ref 135–145)
Total Bilirubin: 1 mg/dL (ref 0.3–1.2)
Total Protein: 5.3 g/dL — ABNORMAL LOW (ref 6.5–8.1)

## 2019-03-14 LAB — APTT: aPTT: 72 seconds — ABNORMAL HIGH (ref 24–36)

## 2019-03-14 LAB — FERRITIN: Ferritin: 446 ng/mL — ABNORMAL HIGH (ref 11–307)

## 2019-03-14 MED ORDER — METOPROLOL TARTRATE 5 MG/5ML IV SOLN
5.0000 mg | Freq: Four times a day (QID) | INTRAVENOUS | Status: DC | PRN
  Administered 2019-03-15: 11:00:00 5 mg via INTRAVENOUS
  Filled 2019-03-14: qty 5

## 2019-03-14 MED ORDER — SIMVASTATIN 20 MG PO TABS: 20.0000 mg | ORAL_TABLET | Freq: Every day | ORAL | Status: DC

## 2019-03-14 MED ORDER — FUROSEMIDE 10 MG/ML IJ SOLN
60.0000 mg | Freq: Once | INTRAMUSCULAR | Status: AC
  Administered 2019-03-14: 09:00:00 60 mg via INTRAVENOUS
  Filled 2019-03-14: qty 6

## 2019-03-14 MED ORDER — ALBUMIN HUMAN 25 % IV SOLN
50.0000 g | Freq: Once | INTRAVENOUS | Status: AC
  Administered 2019-03-14: 50 g via INTRAVENOUS
  Filled 2019-03-14: qty 200

## 2019-03-14 MED ORDER — MAGNESIUM SULFATE 2 GM/50ML IV SOLN
2.0000 g | Freq: Once | INTRAVENOUS | Status: AC
  Administered 2019-03-14: 2 g via INTRAVENOUS
  Filled 2019-03-14: qty 50

## 2019-03-14 MED ORDER — INSULIN GLARGINE 100 UNIT/ML ~~LOC~~ SOLN
10.0000 [IU] | Freq: Every day | SUBCUTANEOUS | Status: DC
  Administered 2019-03-14 – 2019-03-17 (×4): 10 [IU] via SUBCUTANEOUS
  Filled 2019-03-14 (×5): qty 0.1

## 2019-03-14 MED ORDER — ATORVASTATIN CALCIUM 10 MG PO TABS
10.0000 mg | ORAL_TABLET | Freq: Every day | ORAL | Status: DC
  Administered 2019-03-14 – 2019-03-15 (×2): 10 mg via ORAL
  Filled 2019-03-14 (×2): qty 1

## 2019-03-14 MED ORDER — DILTIAZEM HCL ER COATED BEADS 240 MG PO CP24
240.0000 mg | ORAL_CAPSULE | Freq: Every day | ORAL | Status: DC
  Administered 2019-03-14 – 2019-03-20 (×7): 240 mg via ORAL
  Filled 2019-03-14 (×7): qty 1

## 2019-03-14 NOTE — Progress Notes (Signed)
Patient ID: Cassandra Allen, female   DOB: 05-04-1946, 73 y.o.   MRN: [id number]       Subjective: Patient feels much better today.  No further abdominal pain after transitioning to clear liquids yesterday.  Wants solid food again today.  States she got out of bed once yesterday.  Wants to get in the chair.  Objective: Vital signs in last 24 hours: Temp:  [97.5 F (36.4 C)-98.2 F (36.8 C)] 97.5 F (36.4 C) (05/01 0343) Pulse Rate:  [82-114] 82 (05/01 0356) Resp:  [16-35] 20 (05/01 0356) BP: (121-149)/(69-89) 149/85 (05/01 0343) SpO2:  [95 %-100 %] 99 % (05/01 0356) Weight:  [80.9 kg] 80.9 kg (05/01 0126) Last BM Date: 03/13/19  Intake/Output from previous day: 04/30 0701 - 05/01 0700 In: 1523.6 [P.O.:360; I.V.:615.2; IV Piggyback:528.4] Out: 1712.5 [Urine:1350; Drains:162.5; Stool:200] Intake/Output this shift: No intake/output data recorded.  PE: Abd: soft, appropriately tender and tender around drains, +BS, both drains with decreasing output. Epigastric drain still with purulent drainage and some greasy drainage.  LLQ with mixed seropurulent output.  Ostomy working well with good output  Lab Results:  Recent Labs    03/13/19 0550 03/14/19 0350  WBC 10.4 11.4*  HGB 9.9* 9.7*  HCT 35.9* 35.4*  PLT 326 350   BMET Recent Labs    03/13/19 0550 03/14/19 0350  NA 139 139  K 3.8 3.5  CL 107 108  CO2 25 24  GLUCOSE 160* 241*  BUN 17 12  CREATININE 0.71 0.74  CALCIUM 7.9* 8.0*   PT/INR No results for input(s): LABPROT, INR in the last 72 hours. CMP     Component Value Date/Time   NA 139 03/14/2019 0350   NA 143 02/14/2019   K 3.5 03/14/2019 0350   CL 108 03/14/2019 0350   CL 101 02/14/2019   CO2 24 03/14/2019 0350   CO2 23 02/14/2019   GLUCOSE 241 (H) 03/14/2019 0350   BUN 12 03/14/2019 0350   BUN 25 (A) 02/14/2019   CREATININE 0.74 03/14/2019 0350   CREATININE 1.04 (H) 12/18/2018 1029   CALCIUM 8.0 (L) 03/14/2019 0350   CALCIUM 8.7 02/14/2019   PROT 5.3 (L) 03/14/2019 0350   PROT 5.3 (A) 01/23/2019   PROT 5.3 (A) 01/23/2019   ALBUMIN 2.1 (L) 03/14/2019 0350   ALBUMIN 2.4 01/23/2019   ALBUMIN 2.4 01/23/2019   AST 12 (L) 03/14/2019 0350   AST 60 (H) 12/18/2018 1029   ALT 42 03/14/2019 0350   ALT 117 (H) 12/18/2018 1029   ALKPHOS 528 (H) 03/14/2019 0350   BILITOT 1.0 03/14/2019 0350   BILITOT 0.5 12/18/2018 1029   GFRNONAA >60 03/14/2019 0350   GFRNONAA 54 (L) 12/18/2018 1029   GFRAA >60 03/14/2019 0350   GFRAA >60 12/18/2018 1029   Lipase     Component Value Date/Time   LIPASE 38 03/10/2019 2058       Studies/Results: Dg Chest Port 1 View  Result Date: 03/13/2019 CLINICAL DATA:  Shortness of breath. EXAM: PORTABLE CHEST 1 VIEW COMPARISON:  Chest x-rays dated 03/10/2019, 02/05/2019 and 12/31/2018 FINDINGS: Power port in place in good position with the tip at the cavoatrial junction. Moderate left pleural effusion has slightly increased. Overall heart size and pulmonary vascularity are normal. Right lung is clear. No acute bone abnormality. Aortic atherosclerosis. Abscess drainage catheter in the left mid abdomen. IMPRESSION: Slightly increased moderate left pleural effusion. Aortic Atherosclerosis (ICD10-I70.0). Electronically Signed   By: Lorriane Shire M.D.   On: 03/13/2019 11:51  Anti-infectives: Anti-infectives (From admission, onward)   Start     Dose/Rate Route Frequency Ordered Stop   03/11/19 1000  ceFEPIme (MAXIPIME) 2 g in sodium chloride 0.9 % 100 mL IVPB     2 g 200 mL/hr over 30 Minutes Intravenous Every 12 hours 03/10/19 2353     03/11/19 0900  metroNIDAZOLE (FLAGYL) IVPB 500 mg     500 mg 100 mL/hr over 60 Minutes Intravenous Every 8 hours 03/11/19 0101     03/10/19 2100  ceFEPIme (MAXIPIME) 2 g in sodium chloride 0.9 % 100 mL IVPB     2 g 200 mL/hr over 30 Minutes Intravenous  Once 03/10/19 2058 03/10/19 2219   03/10/19 2100  metroNIDAZOLE (FLAGYL) IVPB 500 mg     500 mg 100 mL/hr over 60  Minutes Intravenous  Once 03/10/19 2058 03/11/19 0114       Assessment/Plan Atrial fibrillation with rapid ventricular rate-on Eliquis  Chronic kidney disease stage III Type 2 diabetes-controlled Essential hypertension Depression Hyperlipidemia   Sepsissecondary to intra-abdominal abscess. Adenocarcinoma of the colon with chemotherapy; complete obstruction; liver metastasis. S/plaparotomy with mobilization of the splenic flexure and resection of the sigmoid, descending and distal transverse colon with a Hartman's pouch at the end of the colostomy in the right upper quadrant,12/27/2018 Dr. [name]. -perc drains by IR 4/28 for both abscesses. -upper drain CXs -proteus mirabilis -LLQ drain CX -moderate proteus mirabilis -patient feeling better today and requesting solid diet again.   -cont drains and abx therapy -patient is surgically stable at this point  TUU:EKCM mod diet ID: Cefepime 4/27 >>Flagyl 4/27 >> DVT: SCD's;heparin gtt Follow up: Dr. [name] POC: Cassandra Allen, Cassandra Allen - Son - [phone]   LOS: 3 days    [name] , [facility] 03/14/2019, 8:54 AM Pager: [phone]

## 2019-03-14 NOTE — Progress Notes (Signed)
PROGRESS NOTE    Cassandra Allen  VFI:[id number] DOB: 04/10/1946 DOA: 03/10/2019 PCP: [name], MD   Brief Narrative:  73 y.o. WF PMHx HTN, HLD, [name], diabetes type 2 uncontrolled with complication, chronic diastolic CHF, atrial fibrillation on Eliquis, metastatic colon cancer (s/p of partial colectomy, chemotherapy), s/p of colostomy, CKD stage III, depression,   Presents with abdominal pain. Patient was recently hospitalized from 3/20-3/29 due to post operation abdominal abscess. She had perc drain placement 01/31/19. Pt was also discharged to nursing home on Augmentin for 7 days.    Patient states that she developed worsening abdominal pain today, which is diffuse, worse on upper abdomen, constant, 10 out of 10 in severity, sharp, radiating to the back.  It is associated with a fever and chills.  She also has nausea vomiting several times.  No diarrhea. Patient denies chest pain, cough, shortness of breath.  No symptoms of UTI or unilateral weakness.     ED Course: pt was found to have WBC 12.7, lactic acid 2.5, troponin 0.08, BNP 359.3, procalcitonin 4.38, potassium 3.2, renal function close to baseline, temperature 100.7, oxygen saturation 90 to 99% on room air, respiration rate 30s. Previous CT 03/05/19 showed partial decrease in size to 19.4cm.  Repeat CT shows interval removal of LLQ drain with slight increase in size of complex collection now 20.2 cm.  There is also a persistent complex gas/fluid/fat attenutation LUQ 13 cm collection which persists persists on all scans. Pt is admitted to SDU as inpt. IR, Dr. Jarvis Newcomer was consulted by EDP.   Pt has A fib with RVR with HR upto 170s in ED. Cardizem gtt was started in ED, HR improved to 130s, but Blood pressure dropped to SBP 84. Pt received 2L of Ringer solution in  ED. cardizem gtt was d/c'ed. And another 500 cc of NS will be given. PCCM was consulted. Dr. Oletta Darter recommended to start phenylephrine IV.    Subjective: 5/1 A/O x4,  negative CP, positive S OB, negative abdominal pain.  (States believes abdominal pain was secondary to overeating).   Assessment & Plan:   Principal Problem:   Abscess of abdominal cavity ([place]) Active Problems:   Essential (primary) hypertension   HLD (hyperlipidemia)   Depression, recurrent (HCC)   Anticoagulant long-term use   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   CKD (chronic kidney disease) stage 3, GFR 30-59 ml/min (HCC)   Atrial fibrillation with RVR (HCC)   Chronic kidney disease (CKD), stage II (mild)   Sepsis (HCC)   Type II diabetes mellitus with renal manifestations (HCC)   Hypokalemia   Chronic diastolic CHF (congestive heart failure) (HCC)   Abnormal LFTs   Elevated troponin   Severe sepsis (HCC)   Pleural effusion on left  Sepsis due to abscess of abdominal cavity - Continue antibiotics per surgery - S/plaparotomy with mobilization of the splenic flexure and resection of the sigmoid, descending and distal transverse colon with a Hartman's pouch at the end of the colostomy in the right upper quadrant, on prior admission - 4/29 s/p IR drainage of abscess middle abdomen and LLQ abdomen with replacement of drains. - 5/1 continue minimal drainage from both abdominal JP drains.   A. fib with RVR -Currently NSR.  However patient going in and out of A. fib with RVR, with HR as high as 160 -DC Cardizem 60 mg QID - 5/1 start Cardizem CD 240 mg daily -Metoprolol PRN, may need to start scheduled metoprolol   Essential (primary) HTN -  Resolved   HLD (hyperlipidemia): -Liver enzymes WNL -5/1 start Zocor 20 mg daily. - 4/28 LDL within AHA/ADA guidelines   Depression, recurrent ([place]): -Zoloft 125 mg daily   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:  -f/u with Dr.Yan Burr Medico -4/30 Dr.Yan Feng oncology: Is repeating iron study to see if patient requires IV iron.  No role for chemotherapy at this point will repeat PET scan once patient recovers.       CKD (chronic kidney disease) stage 3, GFR 30-59 ml/min [facility]): Recent Labs  Lab 03/10/19 2058 03/11/19 0557 03/12/19 0437 03/13/19 0550 03/14/19 0350  CREATININE 1.09* 0.83 0.71 0.71 0.74  - Creatinine WNL   Chronic diastolic CHF - Strict in and out +851.82ml -Daily weight Filed Weights   03/12/19 0446 03/13/19 0500 03/14/19 0126  Weight: 79.8 kg 81.7 kg 80.9 kg  -  2D echo on 12/23/2018 showed EF of 60-65%.   -Currently negative sign of acute exacerbation   Atrial fibrillation with RVR ([place]): - 4/30 hold Eliquis overnight and restart in a.m. if patient remains stable.  Continue heparin - Currently A. Fib.  LEFT pleural effusion -Multifactorial CHF, A. fib RVR, malnutrition, mets? - Albumin 50 g followed by Lasix 60 mg x 1 - CT chest pending - Discussed with patient that if she had loculated effusion or unable to reduce effusion with diuresis would need thoracentesis.   Diabetes type 2 controlled with complication - 0/32 hemoglobin A1c= 6.8 -5/1 Lantus 10 units daily - Sensitive SSI  -Patient's diet was advanced to carb modified.  Again warned patient to take it very slowly given her recent surgery.    Hypokalemia -Potassium goal> 4  Hypomagnesmia -Magnesium goal> 2 - Magnesium 2 g   Abnormal LFTs  -Resolved   Elevated troponin:  -Troponin 0.08, no chest pain.  Likely due to demand ischemia secondary to sepsis and atrial fibrillation with RVR. - cycle CE q6 x3 and repeat EKG in the am  Recent Labs  Lab 03/10/19 2058 03/11/19 0238 03/11/19 0557  TROPONINI 0.08* 0.06* 0.05*       DVT prophylaxis: Heparin Code Status: Full Family Communication: None Disposition Plan: TBD    Consultants:  IR  [facility] Oncology    Procedures/Significant Events:  12/27/2018 Dr. [name] S/plaparotomy with mobilization of the splenic flexure and resection of the sigmoid, descending and distal transverse colon with a Hartman's pouch at the end of the colostomy in the  right upper quadrant, 2/14 surgical pathology adenocarcinoma of proximal descending colon, sigmoid mass consistent with large villous adenoma 3/20 abdominal CT from SNF: Large rim-enhancing fluid connection with multiple foci over the lower abdomen with a likely intra-abdominal abscess She was readmitted and under IR drain 01/31/19, Dr. Earleen Newport.  She was transitioned to oral abx and discharged again on 02/09/19.  CT 01/31/19 : 12.6 x 24.6 x 18.4 cm enhancing fluid collection lower abdomen extending to the mid left upper abdomen, likely intra-abdominal abscess IR drain placement 01/31/2019 CT 03/05/2019: Abscess cavity measured 18.1 x 15 x 3.3 cm.  Previously this measured 12.6 x 24.6 x 18.4 drain was positioned within the left side of the abscess cavity abscess did not extend centrally toward midline.  There is also suspected upper abscess measuring 13.8 x 9.4 improved from prior measurement of 16.6 x 8.4 CT 03/08/2018 showed the prior drain have been removed.  There is a persistent fluid collection left lower abdomen extending in central pelvis collection measured 20.2 x 3.9 with mildly increased size from 19.4  x 2.3 on the last scan.  No new fluid collections.  Caudate lobe of the liver shows metastases without significant interval change.  No new or progressive metastatic disease was identified.  Stable left pleural effusion and left lower lobe atelectasis. Chest x-ray showed: 1. Persistent small to moderate left pleural effusion with associated left basilar opacity. 2. Cardiomegaly with underlying mild diffuse pulmonary interstitial edema. 3. Aortic atherosclerosis. 4/29 CT guided drainage intra-abdominal abscess collection:-150 ml green purulent looking fluid removed from the complex mid upper collection after 12 Fr drain placement -30 ml of yellow purulent fluid removed from left lower abscess collection after placement of 12 Fr drain.   4/30 PCXR- slightly increased moderate LEFT pleural effusion    I have personally reviewed and interpreted all radiology studies and my findings are as above.  VENTILATOR SETTINGS:    Cultures 4/28 MRSA by PCR negative 4/28 abscess drainage LLQ incubated for better growth 4/28 abscess drainage middle upper abdomen reintubated for better growth    Antimicrobials: Anti-infectives (From admission, onward)   Start     Stop   03/11/19 1000  ceFEPIme (MAXIPIME) 2 g in sodium chloride 0.9 % 100 mL IVPB         03/11/19 0900  metroNIDAZOLE (FLAGYL) IVPB 500 mg         03/10/19 2100  ceFEPIme (MAXIPIME) 2 g in sodium chloride 0.9 % 100 mL IVPB     03/10/19 2219   03/10/19 2100  metroNIDAZOLE (FLAGYL) IVPB 500 mg     03/11/19 0114       Devices    LINES / TUBES:  12 French drain mid upper abdomen 4/29>> 12 French drain left lower abdomen 4/29>>     Continuous Infusions: . sodium chloride 75 mL/hr at 03/13/19 1126  . sodium chloride 75 mL/hr at 03/13/19 0500  . ceFEPime (MAXIPIME) IV 2 g (03/13/19 2132)  . heparin 1,500 Units/hr (03/14/19 0928)  . magnesium sulfate 1 - 4 g bolus IVPB    . metronidazole 500 mg (03/14/19 0917)     Objective: Vitals:   03/13/19 2250 03/14/19 0126 03/14/19 0343 03/14/19 0356  BP: (!) 145/76  (!) 149/85   Pulse: (!) 114  (!) 111 82  Resp: 16  (!) 35 20  Temp: 97.6 F (36.4 C)  (!) 97.5 F (36.4 C)   TempSrc: Oral  Oral   SpO2: 97%  99% 99%  Weight:  80.9 kg    Height:        Intake/Output Summary (Last 24 hours) at 03/14/2019 1103 Last data filed at 03/14/2019 0930 Gross per 24 hour  Intake 1145.68 ml  Output 1020 ml  Net 125.68 ml   Filed Weights   03/12/19 0446 03/13/19 0500 03/14/19 0126  Weight: 79.8 kg 81.7 kg 80.9 kg    General: A/O x4, positive acute respiratory distress Eyes: negative scleral hemorrhage, negative anisocoria, negative icterus ENT: Negative Runny nose, negative gingival bleeding, Neck:  Negative scars, masses, torticollis, lymphadenopathy, JVD Lungs: Clear to  auscultation bilaterally, with absent breath sounds LLL, without wheezes or crackles Cardiovascular: Irregular irregular rhythm and rate, without murmur gallop or rub normal S1 and S2 Abdomen: Morbidly obese, positive abdominal pain LLQ>> RLQ,, nondistended, positive soft, bowel sounds, no rebound, no ascites, no appreciable mass, JP drain x2 in place RLQ/LLQ draining mostly straw-colored fluid.  Ostomy bag in place negative sign of infection or leak around the seal. Extremities: No significant cyanosis, clubbing, +2 bilateral pedal edema  Skin: Negative rashes,  lesions, ulcers Psychiatric:  Negative depression, negative anxiety, negative fatigue, negative mania  Central nervous system:  Cranial nerves II through XII intact, tongue/uvula midline, all extremities muscle strength 5/5, sensation intact throughout, negative dysarthria, negative expressive aphasia, negative receptive aphasia.      Data Reviewed: Care during the described time interval was provided by me .  I have reviewed this patient's available data, including medical history, events of note, physical examination, and all test results as part of my evaluation.   CBC: Recent Labs  Lab 03/10/19 2058 03/10/19 2200 03/11/19 0557 03/12/19 0437 03/13/19 0550 03/14/19 0350  WBC 12.7*  --  15.2* 10.6* 10.4 11.4*  NEUTROABS 11.8*  --   --   --   --   --   HGB 11.1* 12.2 8.7* 9.4* 9.9* 9.7*  HCT 38.6 36.0 30.9* 33.2* 35.9* 35.4*  MCV 91.9  --  94.5 95.1 96.0 95.9  PLT 359  --  299 278 326 277   Basic Metabolic Panel: Recent Labs  Lab 03/10/19 2058 03/10/19 2200 03/11/19 0238 03/11/19 0557 03/12/19 0437 03/13/19 0550 03/14/19 0350  NA 141 142  --  141 140 139 139  K 3.1* 3.2*  --  3.7 3.9 3.8 3.5  CL 102  --   --  104 104 107 108  CO2 31  --   --  27 27 25 24   GLUCOSE 174*  --   --  114* 136* 160* 241*  BUN 20  --   --  20 19 17 12   CREATININE 1.09*  --   --  0.83 0.71 0.71 0.74  CALCIUM 8.7*  --   --  8.0* 8.1* 7.9*  8.0*  MG  --   --  1.1*  --  2.2 1.8 1.7  PHOS  --   --   --  4.0 3.7 3.5  --    GFR: Estimated Creatinine Clearance: 62.6 mL/min (by C-G formula based on SCr of 0.74 mg/dL). Liver Function Tests: Recent Labs  Lab 03/10/19 2058 03/14/19 0350  AST 237* 12*  ALT 95* 42  ALKPHOS 1,377* 528*  BILITOT 2.3* 1.0  PROT 6.0* 5.3*  ALBUMIN 2.5* 2.1*   Recent Labs  Lab 03/10/19 2058  LIPASE 38   No results for input(s): AMMONIA in the last 168 hours. Coagulation Profile: Recent Labs  Lab 03/11/19 0238  INR 2.1*   Cardiac Enzymes: Recent Labs  Lab 03/10/19 2058 03/11/19 0238 03/11/19 0557  TROPONINI 0.08* 0.06* 0.05*   BNP (last 3 results) No results for input(s): PROBNP in the last 8760 hours. HbA1C: No results for input(s): HGBA1C in the last 72 hours. CBG: Recent Labs  Lab 03/13/19 0545 03/13/19 1119 03/13/19 1646 03/13/19 2137 03/14/19 0659  GLUCAP 143* 216* 174* 252* 253*   Lipid Profile: No results for input(s): CHOL, HDL, LDLCALC, TRIG, CHOLHDL, LDLDIRECT in the last 72 hours. Thyroid Function Tests: No results for input(s): TSH, T4TOTAL, FREET4, T3FREE, THYROIDAB in the last 72 hours. Anemia Panel: Recent Labs    03/14/19 0350  FERRITIN 446*  TIBC 167*  IRON 100   Urine analysis:    Component Value Date/Time   COLORURINE AMBER (A) 03/10/2019 0041   APPEARANCEUR CLEAR 03/10/2019 0041   LABSPEC 1.032 (H) 03/10/2019 0041   PHURINE 5.0 03/10/2019 0041   GLUCOSEU NEGATIVE 03/10/2019 0041   HGBUR SMALL (A) 03/10/2019 0041   BILIRUBINUR NEGATIVE 03/10/2019 0041   KETONESUR NEGATIVE 03/10/2019 0041   PROTEINUR 30 (A) 03/10/2019 0041  UROBILINOGEN 0.2 03/25/2015 2317   NITRITE POSITIVE (A) 03/10/2019 0041   LEUKOCYTESUR MODERATE (A) 03/10/2019 0041   Sepsis Labs: @LABRCNTIP (procalcitonin:4,lacticidven:4)  ) Recent Results (from the past 240 hour(s))  Urine culture     Status: None   Collection Time: 03/10/19  8:41 PM  Result Value Ref Range  Status   Specimen Description URINE, CATHETERIZED  Final   Special Requests NONE  Final   Culture   Final    NO GROWTH Performed at [facility], [place] [address] [place], [place] [address]    Report Status 03/13/2019 FINAL  Final  Blood Culture (routine x 2)     Status: Abnormal   Collection Time: 03/10/19  9:37 PM  Result Value Ref Range Status   Specimen Description BLOOD RIGHT HAND  Final   Special Requests   Final    BOTTLES DRAWN AEROBIC AND ANAEROBIC Blood Culture results may not be optimal due to an inadequate volume of blood received in culture bottles   Culture  Setup Time   Final    GRAM NEGATIVE RODS IN BOTH AEROBIC AND ANAEROBIC BOTTLES CRITICAL RESULT CALLED TO, READ BACK BY AND VERIFIED WITH: Cristopher Estimable PharmD 11:10 03/11/19 ([email or web address]) Performed at [facility], [place] [address], [place], [place] [address]    Culture ESCHERICHIA COLI (A)  Final   Report Status 03/13/2019 FINAL  Final   Organism ID, Bacteria ESCHERICHIA COLI  Final      Susceptibility   Escherichia coli - MIC*    AMPICILLIN >=32 RESISTANT Resistant     CEFAZOLIN <=4 SENSITIVE Sensitive     CEFEPIME <=1 SENSITIVE Sensitive     CEFTAZIDIME <=1 SENSITIVE Sensitive     CEFTRIAXONE <=1 SENSITIVE Sensitive     CIPROFLOXACIN <=0.25 SENSITIVE Sensitive     GENTAMICIN <=1 SENSITIVE Sensitive     IMIPENEM <=0.25 SENSITIVE Sensitive     TRIMETH/SULFA <=20 SENSITIVE Sensitive     AMPICILLIN/SULBACTAM >=32 RESISTANT Resistant     PIP/TAZO <=4 SENSITIVE Sensitive     Extended ESBL NEGATIVE Sensitive     * ESCHERICHIA COLI  Blood Culture ID Panel (Reflexed)     Status: Abnormal   Collection Time: 03/10/19  9:37 PM  Result Value Ref Range Status   Enterococcus species NOT DETECTED NOT DETECTED Final   Listeria monocytogenes NOT DETECTED NOT DETECTED Final   Staphylococcus species NOT DETECTED NOT DETECTED Final   Staphylococcus aureus (BCID) NOT DETECTED NOT DETECTED Final   Streptococcus species  NOT DETECTED NOT DETECTED Final   Streptococcus agalactiae NOT DETECTED NOT DETECTED Final   Streptococcus pneumoniae NOT DETECTED NOT DETECTED Final   Streptococcus pyogenes NOT DETECTED NOT DETECTED Final   Acinetobacter baumannii NOT DETECTED NOT DETECTED Final   Enterobacteriaceae species DETECTED (A) NOT DETECTED Final    Comment: Enterobacteriaceae represent a large family of gram-negative bacteria, not a single organism. CRITICAL RESULT CALLED TO, READ BACK BY AND VERIFIED WITH: Cristopher Estimable PharmD 11:10 03/11/19 ([email or web address])    Enterobacter cloacae complex NOT DETECTED NOT DETECTED Final   Escherichia coli DETECTED (A) NOT DETECTED Final    Comment: CRITICAL RESULT CALLED TO, READ BACK BY AND VERIFIED WITH: Cristopher Estimable PharmD 11:10 03/11/19 ([email or web address])    Klebsiella oxytoca NOT DETECTED NOT DETECTED Final   Klebsiella pneumoniae NOT DETECTED NOT DETECTED Final   Proteus species NOT DETECTED NOT DETECTED Final   Serratia marcescens NOT DETECTED NOT DETECTED Final   Carbapenem resistance NOT DETECTED NOT DETECTED Final  Haemophilus influenzae NOT DETECTED NOT DETECTED Final   Neisseria meningitidis NOT DETECTED NOT DETECTED Final   Pseudomonas aeruginosa NOT DETECTED NOT DETECTED Final   Candida albicans NOT DETECTED NOT DETECTED Final   Candida glabrata NOT DETECTED NOT DETECTED Final   Candida krusei NOT DETECTED NOT DETECTED Final   Candida parapsilosis NOT DETECTED NOT DETECTED Final   Candida tropicalis NOT DETECTED NOT DETECTED Final    Comment: Performed at [facility], [place] [address], [place], [place] [address]  Blood Culture (routine x 2)     Status: Abnormal   Collection Time: 03/10/19  9:50 PM  Result Value Ref Range Status   Specimen Description BLOOD LEFT ARM  Final   Special Requests   Final    BOTTLES DRAWN AEROBIC AND ANAEROBIC Blood Culture adequate volume   Culture  Setup Time   Final    GRAM NEGATIVE RODS IN BOTH AEROBIC AND ANAEROBIC BOTTLES  CRITICAL VALUE NOTED.  VALUE IS CONSISTENT WITH PREVIOUSLY REPORTED AND CALLED VALUE.    Culture (A)  Final    ESCHERICHIA COLI SUSCEPTIBILITIES PERFORMED ON PREVIOUS CULTURE WITHIN THE LAST 5 DAYS. Performed at [facility], [place] [address], [place], [place] [address]    Report Status 03/13/2019 FINAL  Final  MRSA PCR Screening     Status: None   Collection Time: 03/11/19  3:47 AM  Result Value Ref Range Status   MRSA by PCR NEGATIVE NEGATIVE Final    Comment:        The GeneXpert MRSA Assay (FDA approved for NASAL specimens only), is one component of a comprehensive MRSA colonization surveillance program. It is not intended to diagnose MRSA infection nor to guide or monitor treatment for MRSA infections. Performed at [facility], [place] [address], [place], [place] [address]   Aerobic/Anaerobic Culture (surgical/deep wound)     Status: None (Preliminary result)   Collection Time: 03/11/19  3:00 PM  Result Value Ref Range Status   Specimen Description ABSCESS DRAINAGE  Final   Special Requests MID UPPER ABDOMEN  Final   Gram Stain   Final    ABUNDANT WBC PRESENT,BOTH PMN AND MONONUCLEAR MODERATE GRAM VARIABLE ROD FEW GRAM NEGATIVE RODS    Culture   Final    MODERATE PROTEUS MIRABILIS SUSCEPTIBILITIES TO FOLLOW HOLDING FOR POSSIBLE ANAEROBE Performed at [facility], [place] [address] [place], [place] [address]    Report Status PENDING  Incomplete  Aerobic/Anaerobic Culture (surgical/deep wound)     Status: None (Preliminary result)   Collection Time: 03/11/19  3:00 PM  Result Value Ref Range Status   Specimen Description ABSCESS DRAINAGE  Final   Special Requests LEFT LOWER ABDOMEN  Final   Gram Stain   Final    ABUNDANT WBC PRESENT,BOTH PMN AND MONONUCLEAR RARE GRAM POSITIVE COCCI FEW GRAM NEGATIVE RODS FEW GRAM VARIABLE ROD Performed at [facility], [place] [address], [place], [place] [address]    Culture   Final    MODERATE PROTEUS MIRABILIS  NO ANAEROBES ISOLATED; CULTURE IN PROGRESS FOR 5 DAYS    Report Status PENDING  Incomplete   Organism ID, Bacteria PROTEUS MIRABILIS  Final      Susceptibility   Proteus mirabilis - MIC*    AMPICILLIN <=2 SENSITIVE Sensitive     CEFAZOLIN <=4 SENSITIVE Sensitive     CEFEPIME <=1 SENSITIVE Sensitive     CEFTAZIDIME <=1 SENSITIVE Sensitive     CEFTRIAXONE <=1 SENSITIVE Sensitive  CIPROFLOXACIN <=0.25 SENSITIVE Sensitive     GENTAMICIN <=1 SENSITIVE Sensitive     IMIPENEM 2 SENSITIVE Sensitive     TRIMETH/SULFA <=20 SENSITIVE Sensitive     AMPICILLIN/SULBACTAM <=2 SENSITIVE Sensitive     PIP/TAZO <=4 SENSITIVE Sensitive     * MODERATE PROTEUS MIRABILIS         Radiology Studies: Dg Chest Port 1 View  Result Date: 03/13/2019 CLINICAL DATA:  Shortness of breath. EXAM: PORTABLE CHEST 1 VIEW COMPARISON:  Chest x-rays dated 03/10/2019, 02/05/2019 and 12/31/2018 FINDINGS: Power port in place in good position with the tip at the cavoatrial junction. Moderate left pleural effusion has slightly increased. Overall heart size and pulmonary vascularity are normal. Right lung is clear. No acute bone abnormality. Aortic atherosclerosis. Abscess drainage catheter in the left mid abdomen. IMPRESSION: Slightly increased moderate left pleural effusion. Aortic Atherosclerosis (ICD10-I70.0). Electronically Signed   By: Lorriane Shire M.D.   On: 03/13/2019 11:51        Scheduled Meds: . diltiazem  240 mg Oral Daily  . insulin aspart  0-9 Units Subcutaneous TID WC  . insulin glargine  10 Units Subcutaneous Daily  . pantoprazole (PROTONIX) IV  40 mg Intravenous QHS  . sertraline  125 mg Oral Daily  . simvastatin  20 mg Oral QHS  . sodium chloride flush  5 mL Intracatheter Q8H   Continuous Infusions: . sodium chloride 75 mL/hr at 03/13/19 1126  . sodium chloride 75 mL/hr at 03/13/19 0500  . ceFEPime (MAXIPIME) IV 2 g (03/13/19 2132)  . heparin 1,500 Units/hr (03/14/19 0928)  . magnesium  sulfate 1 - 4 g bolus IVPB    . metronidazole 500 mg (03/14/19 0917)     LOS: 3 days   The patient is critically ill with multiple organ systems failure and requires high complexity decision making for assessment and support, frequent evaluation and titration of therapies, application of advanced monitoring technologies and extensive interpretation of multiple databases. Critical Care Time devoted to patient care services described in this note  Time spent: 40 minutes     WOODS, Geraldo Docker, MD [facility] Pager [phone]  If 7PM-7AM, please contact night-coverage [email or web address] Password [email or web address] 03/14/2019, 11:03 AM

## 2019-03-14 NOTE — Progress Notes (Signed)
Inpatient Diabetes Program Recommendations  AACE/ADA: New Consensus Statement on Inpatient Glycemic Control (2015)  Target Ranges:  Prepandial:   less than 140 mg/dL      Peak postprandial:   less than 180 mg/dL (1-2 hours)      Critically ill patients:  140 - 180 mg/dL   Lab Results  Component Value Date   GLUCAP 253 (H) 03/14/2019   HGBA1C 6.8 (H) 03/11/2019    Review of Glycemic Control Results for CHARMANE, PROTZMAN (MRN [id number]) as of 03/14/2019 09:56  Ref. Range 03/13/2019 11:19 03/13/2019 16:46 03/13/2019 21:37 03/14/2019 06:59  Glucose-Capillary Latest Ref Range: 70 - 99 mg/dL 216 (H) 174 (H) 252 (H) 253 (H)   Diabetes history: DM 2 Outpatient Diabetes medications:  NPH 12 units bid Current orders for Inpatient glycemic control:  Novolog sensitive tid with meals Inpatient Diabetes Program Recommendations:   May consider adding Levemir 6 units bid (1/2 of home dose of NPH).    Thanks,  Adah Perl, RN, BC-ADM Inpatient Diabetes Coordinator Pager [phone] (8a-5p)

## 2019-03-14 NOTE — Care Management Important Message (Signed)
Important Message  Patient Details  Name: Cassandra Allen MRN: [id number] Date of Birth: 14-Nov-1945   Medicare Important Message Given:  Yes    Orbie Pyo 03/14/2019, 4:08 PM

## 2019-03-14 NOTE — Progress Notes (Signed)
   Patient with history of metastatic colon cancer (s/p partial colectomy and end colostomy) with secondary postoperative abscess s/p mid upper abdomen drain (labeled 1) and LLQ drain (laveled 2) placement 03/11/2019 by Dr. Anselm Pancoast.  Feeling some better, diet to be advanced Drains intact, sites clean. Output still purulent/cloudy  Continue current drain management- continue with Qshift flushes/monitor of output. Plan for CT abdomen to evaluate for possible drain pull(s) when output <10 cc/day. Appreciate and agree with CCS and TRH management. IR to follow.  Ascencion Dike PA-C Interventional Radiology 03/14/2019 9:22 AM

## 2019-03-14 NOTE — Progress Notes (Signed)
[place] for Apxiaban to Heparin Indication: atrial fibrillation  Allergies  Allergen Reactions  . Metformin And Related Nausea And Vomiting  . Prednisone Nausea And Vomiting    Patient Measurements: Height: 5\' 2"  (157.5 cm) Weight: 178 lb 5.6 oz (80.9 kg) IBW/kg (Calculated) : 50.1  Vital Signs: Temp: 97.5 F (36.4 C) (05/01 0343) Temp Source: Oral (05/01 0343) BP: 149/85 (05/01 0343) Pulse Rate: 82 (05/01 0356)  Labs: Recent Labs    03/12/19 0437  03/12/19 2048 03/13/19 0550 03/14/19 0349 03/14/19 0350  HGB 9.4*  --   --  9.9*  --  9.7*  HCT 33.2*  --   --  35.9*  --  35.4*  PLT 278  --   --  326  --  350  APTT 53*   < > 60* 73*  --  72*  HEPARINUNFRC 1.32*  --   --  0.56 0.29*  --   CREATININE 0.71  --   --  0.71  --  0.74   < > = values in this interval not displayed.    Estimated Creatinine Clearance: 62.6 mL/min (by C-G formula based on SCr of 0.74 mg/dL).   Medical History: Past Medical History:  Diagnosis Date  . Acute diastolic heart failure ([place]) 12/22/2018  . Arthritis   . Atrial fibrillation, chronic 12/22/2018  . Cancer of left colon ([place]) 10/30/2018  . Cancer of sigmoid colon  12/27/2018  . Diabetes mellitus without complication ([place])   . Hypertension   . Obesity (BMI 30-39.9) 12/27/2018    Assessment: 20 yoF on apixaban at home for AFib start on IV heparin with need for IR procedures. Heparin level now subtherapeutc and aPTT therapeutic suggesting DOAC has cleared - will dose with heparin levels now. CBC remains stable.   Goal of Therapy:  Heparin level 0.3-0.7 units/ml Monitor platelets by anticoagulation protocol: Yes  PTT = 66 to 102 seconds   Plan:  -Increase heparin to 1500 units/hr -Transition back to apixaban once all procedures completed -Daily heparin level and CBC   Arrie Senate, PharmD, BCPS Clinical Pharmacist [phone] Please check AMION for all [place]  numbers 03/14/2019

## 2019-03-14 NOTE — TOC Progression Note (Signed)
Transition of Care [facility]) - Progression Note    Patient Details  Name: Cassandra Allen MRN: [id number] Date of Birth: 08/22/1946  Transition [facility]) CM/SW [place], LCSW Phone Number: 03/14/2019, 1:17 PM  Clinical Narrative: [name] unable to [facility] patient back because as of a week ago, they are no longer in network with her insurance company. Patient and son are aware and willing to look into other facility options. Provided patient with CMS Medicare scores for facilities within 50 miles of her zip code. Son said she cannot afford copays but thinks she would qualify for Medicaid based on her assets and monthly income. Sent out referral. Will follow up with bed offers once available.  Expected Discharge Plan: [place] Barriers to Discharge: Continued Medical Work up, Ship broker  Expected Discharge Plan and Services Expected Discharge Plan: [place]: [place] arrangements for the past 2 months: [place]                                       Social Determinants of Health (SDOH) Interventions    Readmission Risk Interventions Readmission Risk Prevention Plan 03/13/2019  Transportation Screening Complete  Medication Review Press photographer) Complete  HRI or [place] Not Complete  HRI or Home Care Consult Pt Refusal Comments Plan for discharge to SNF.  SW Recovery Care/Counseling Consult Complete  Palliative Care Screening Not Applicable  Skilled Nursing Facility Complete  Some recent data might be hidden

## 2019-03-14 NOTE — Progress Notes (Signed)
Physical Therapy Treatment Patient Details Name: Cassandra Allen MRN: [id number] DOB: November 21, 1945 Today's Date: 03/14/2019    History of Present Illness 73 yo female recently dx with colon cancer with mets.  Had surgery.  Post op complicated by abscess.  Had drain by IR.  Grew Enterococcus.  D/c home on ABx and had drain removed 4/22.  She was noted to have enlarging abscess.  Developed abdominal pain, nausea, vomiting.  In ER had A fib with RVR.  Started on cardizem.  Became hypotensive.     PT Comments    Pt needed encouragement to participate after back from tests, but agreeable.  Emphasis on sit to stand from different surfaces, gait training with RW (which was found to be limited) and standing tolerance    Follow Up Recommendations  SNF     Equipment Recommendations  None recommended by PT    Recommendations for Other Services       Precautions / Restrictions Precautions Precautions: Fall    Mobility  Bed Mobility               General bed mobility comments: oob in the chair  Transfers Overall transfer level: Needs assistance Equipment used: Rolling walker (2 wheeled) Transfers: Sit to/from Stand Sit to Stand: Min assist;Mod assist Stand pivot transfers: (mod for lower surfaces)       General transfer comment: cues for hand placement.  Assist to come both forward and boost up.  Ambulation/Gait Ambulation/Gait assistance: Mod assist(+2 needed, but not available at the time) Gait Distance (Feet): 5 Feet(x2 with rest in between) Assistive device: Rolling walker (2 wheeled) Gait Pattern/deviations: Step-through pattern;Decreased step length - right;Decreased step length - left;Decreased stride length     General Gait Details: Steps were effortful, short and painful per pt.  She was flexed of posture and had to be supported and the RW maneuvered.   Stairs             Wheelchair Mobility    Modified Rankin (Stroke Patients Only)        Balance Overall balance assessment: Needs assistance Sitting-balance support: Feet unsupported;No upper extremity supported Sitting balance-Leahy Scale: Fair       Standing balance-Leahy Scale: Poor Standing balance comment: pt stood in the RW for pericare for urinary incontinence.  Needed both assist and the AD                            Cognition Arousal/Alertness: Awake/alert Behavior During Therapy: [facility] for tasks assessed/performed Overall Cognitive Status: Within Functional Limits for tasks assessed                                        Exercises      General Comments        Pertinent Vitals/Pain Pain Assessment: Faces Faces Pain Scale: No hurt    Home Living                      Prior Function            PT Goals (current goals can now be found in the care plan section) Acute Rehab PT Goals Patient Stated Goal: go back to SNF PT Goal Formulation: With patient Time For Goal Achievement: 03/26/19 Potential to Achieve Goals: Good Progress towards PT goals: Progressing toward goals    Frequency  Min 3X/week      PT Plan Current plan remains appropriate    Co-evaluation              AM-PAC PT "6 Clicks" Mobility   Outcome Measure  Help needed turning from your back to your side while in a flat bed without using bedrails?: A Little Help needed moving from lying on your back to sitting on the side of a flat bed without using bedrails?: A Lot Help needed moving to and from a bed to a chair (including a wheelchair)?: A Lot Help needed standing up from a chair using your arms (e.g., wheelchair or bedside chair)?: A Lot Help needed to walk in hospital room?: A Lot Help needed climbing 3-5 steps with a railing? : Total 6 Click Score: 12    End of Session   Activity Tolerance: Patient tolerated treatment well Patient left: in chair;with call bell/phone within reach Nurse Communication: Mobility status PT  Visit Diagnosis: Unsteadiness on feet (R26.81);Muscle weakness (generalized) (M62.81)     Time: 7846-9629 PT Time Calculation (min) (ACUTE ONLY): 26 min  Charges:  $Therapeutic Activity: 23-37 mins                     03/14/2019  Donnella Sham, PT Acute Rehabilitation Services [phone]  (pager) [phone]  (office)   Tessie Fass Amadi Frady 03/14/2019, 3:43 PM

## 2019-03-15 LAB — GLUCOSE, CAPILLARY
Glucose-Capillary: 197 mg/dL — ABNORMAL HIGH (ref 70–99)
Glucose-Capillary: 185 mg/dL — ABNORMAL HIGH (ref 70–99)
Glucose-Capillary: 192 mg/dL — ABNORMAL HIGH (ref 70–99)
Glucose-Capillary: 112 mg/dL — ABNORMAL HIGH (ref 70–99)

## 2019-03-15 LAB — CBC
HCT: 31.3 % — ABNORMAL LOW (ref 36.0–46.0)
Hemoglobin: 8.7 g/dL — ABNORMAL LOW (ref 12.0–15.0)
MCH: 26.4 pg (ref 26.0–34.0)
MCHC: 27.8 g/dL — ABNORMAL LOW (ref 30.0–36.0)
MCV: 94.8 fL (ref 80.0–100.0)
Platelets: 303 10*3/uL (ref 150–400)
RBC: 3.3 MIL/uL — ABNORMAL LOW (ref 3.87–5.11)
RDW: 18 % — ABNORMAL HIGH (ref 11.5–15.5)
WBC: 8.4 10*3/uL (ref 4.0–10.5)
nRBC: 0 % (ref 0.0–0.2)

## 2019-03-15 LAB — BASIC METABOLIC PANEL
Anion gap: 9 (ref 5–15)
BUN: 10 mg/dL (ref 8–23)
CO2: 29 mmol/L (ref 22–32)
Calcium: 8.2 mg/dL — ABNORMAL LOW (ref 8.9–10.3)
Chloride: 104 mmol/L (ref 98–111)
Creatinine, Ser: 0.81 mg/dL (ref 0.44–1.00)
GFR calc Af Amer: >60 mL/min (ref >60)
GFR calc non Af Amer: >60 mL/min (ref >60)
Glucose, Bld: 241 mg/dL — ABNORMAL HIGH (ref 70–99)
Potassium: 2.8 mmol/L — ABNORMAL LOW (ref 3.5–5.1)
Sodium: 142 mmol/L (ref 135–145)

## 2019-03-15 LAB — MAGNESIUM: Magnesium: 1.6 mg/dL — ABNORMAL LOW (ref 1.7–2.4)

## 2019-03-15 LAB — NOVEL CORONAVIRUS, NAA (HOSP ORDER, SEND-OUT TO REF LAB; TAT 18-24 HRS): SARS-CoV-2, NAA: NOT DETECTED

## 2019-03-15 LAB — HEPARIN LEVEL (UNFRACTIONATED): Heparin Unfractionated: 0.24 IU/mL — ABNORMAL LOW (ref 0.30–0.70)

## 2019-03-15 MED ORDER — POTASSIUM CHLORIDE CRYS ER 20 MEQ PO TBCR
50.0000 meq | EXTENDED_RELEASE_TABLET | Freq: Once | ORAL | Status: AC
  Administered 2019-03-15: 50 meq via ORAL
  Filled 2019-03-15: qty 2

## 2019-03-15 MED ORDER — FUROSEMIDE 10 MG/ML IJ SOLN
60.0000 mg | Freq: Once | INTRAMUSCULAR | Status: AC
  Administered 2019-03-15: 14:00:00 60 mg via INTRAVENOUS
  Filled 2019-03-15: qty 6

## 2019-03-15 MED ORDER — INSULIN ASPART 100 UNIT/ML ~~LOC~~ SOLN
4.0000 [IU] | Freq: Three times a day (TID) | SUBCUTANEOUS | Status: DC
  Administered 2019-03-15 – 2019-03-17 (×6): 4 [IU] via SUBCUTANEOUS

## 2019-03-15 MED ORDER — MAGNESIUM SULFATE 2 GM/50ML IV SOLN
2.0000 g | Freq: Once | INTRAVENOUS | Status: AC
  Administered 2019-03-15: 2 g via INTRAVENOUS
  Filled 2019-03-15: qty 50

## 2019-03-15 MED ORDER — ALBUMIN HUMAN 25 % IV SOLN
25.0000 g | Freq: Once | INTRAVENOUS | Status: AC
  Administered 2019-03-15: 25 g via INTRAVENOUS
  Filled 2019-03-15: qty 100

## 2019-03-15 MED ORDER — METOPROLOL TARTRATE 12.5 MG HALF TABLET
12.5000 mg | ORAL_TABLET | Freq: Two times a day (BID) | ORAL | Status: DC
  Administered 2019-03-15 – 2019-03-20 (×11): 12.5 mg via ORAL
  Filled 2019-03-15 (×11): qty 1

## 2019-03-15 NOTE — Progress Notes (Signed)
PROGRESS NOTE    Cassandra Allen  DQQ:[id number] DOB: 1946/07/23 DOA: 03/10/2019 PCP: [name], MD   Brief Narrative:  73 y.o. WF PMHx HTN, HLD, [name], diabetes type 2 controlled with complication, chronic diastolic CHF, atrial fibrillation on Eliquis, metastatic colon cancer (s/p of partial colectomy, chemotherapy), s/p of colostomy, CKD stage III, depression,   Presents with abdominal pain. Patient was recently hospitalized from 3/20-3/29 due to post operation abdominal abscess. She had perc drain placement 01/31/19. Pt was also discharged to nursing home on Augmentin for 7 days.    Patient states that she developed worsening abdominal pain today, which is diffuse, worse on upper abdomen, constant, 10 out of 10 in severity, sharp, radiating to the back.  It is associated with a fever and chills.  She also has nausea vomiting several times.  No diarrhea. Patient denies chest pain, cough, shortness of breath.  No symptoms of UTI or unilateral weakness.     ED Course: pt was found to have WBC 12.7, lactic acid 2.5, troponin 0.08, BNP 359.3, procalcitonin 4.38, potassium 3.2, renal function close to baseline, temperature 100.7, oxygen saturation 90 to 99% on room air, respiration rate 30s. Previous CT 03/05/19 showed partial decrease in size to 19.4cm.  Repeat CT shows interval removal of LLQ drain with slight increase in size of complex collection now 20.2 cm.  There is also a persistent complex gas/fluid/fat attenutation LUQ 13 cm collection which persists persists on all scans. Pt is admitted to SDU as inpt. IR, Dr. Jarvis Newcomer was consulted by EDP.   Pt has A fib with RVR with HR upto 170s in ED. Cardizem gtt was started in ED, HR improved to 130s, but Blood pressure dropped to SBP 84. Pt received 2L of Ringer solution in  ED. cardizem gtt was d/c'ed. And another 500 cc of NS will be given. PCCM was consulted. Dr. Oletta Darter recommended to start phenylephrine IV.    Subjective: 5/2 A/O x4,  negative CP, positive S OB (but improved since 5/1), negative abdominal pain.  Sitting in a chair comfortably    Assessment & Plan:   Principal Problem:   Abscess of abdominal cavity (HCC) Active Problems:   Essential (primary) hypertension   HLD (hyperlipidemia)   Depression, recurrent (HCC)   Anticoagulant long-term use   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   CKD (chronic kidney disease) stage 3, GFR 30-59 ml/min (HCC)   Atrial fibrillation with RVR (HCC)   Chronic kidney disease (CKD), stage II (mild)   Sepsis (HCC)   Type II diabetes mellitus with renal manifestations (HCC)   Hypokalemia   Chronic diastolic CHF (congestive heart failure) (HCC)   Abnormal LFTs   Elevated troponin   Severe sepsis (HCC)   Pleural effusion on left  Sepsis due to abscess of abdominal cavity - Continue antibiotics per surgery - S/plaparotomy with mobilization of the splenic flexure and resection of the sigmoid, descending and distal transverse colon with a Hartman's pouch at the end of the colostomy in the right upper quadrant, on prior admission - 4/29 s/p IR drainage of abscess middle abdomen and LLQ abdomen with replacement of drains. - 5/1 continue minimal drainage from both abdominal JP drains.  Chronic diastolic CHF - Strict in and out -2.0 L -Daily weight Filed Weights   03/12/19 0446 03/13/19 0500 03/14/19 0126  Weight: 79.8 kg 81.7 kg 80.9 kg  -  2D echo on 12/23/2018 showed EF of 60-65%.   -Currently negative sign of acute exacerbation  Atrial fibrillation with RVR ([place]): - 4/30 hold Eliquis overnight and restart in a.m. if patient remains stable.  Continue heparin - Currently A. fib - 5/1 start Cardizem CD 240 mg daily -5/2 metoprolol 12.5 mg BID -Metoprolol PRN,  - Albumin 50 g followed by Lasix 60 mg x 1 - 5/2 chest CT showed bilateral pleural effusion LEFT significantly larger than right. -Albumin 25 g + Lasix 60 mg  -Lasix 40 mg BID   Essential (primary)  HTN -Resolved   HLD (hyperlipidemia): -Liver enzymes WNL -5/1 start Zocor 20 mg daily. - 4/28 LDL within AHA/ADA guidelines   Depression, recurrent ([place]): -Zoloft 125 mg daily   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:  -f/u with Dr.Yan Burr Medico -4/30 Dr.Yan Feng oncology: Is repeating iron study to see if patient requires IV iron.  No role for chemotherapy at this point will repeat PET scan once patient recovers.      CKD (chronic kidney disease) stage 3, GFR 30-59 ml/min [facility]): Recent Labs  Lab 03/11/19 0557 03/12/19 0437 03/13/19 0550 03/14/19 0350 03/15/19 0500  CREATININE 0.83 0.71 0.71 0.74 0.81  - Creatinine WNL  LEFT pleural effusion -Multifactorial CHF, A. fib RVR, malnutrition, mets? -See A. fib - Discussed with patient that if she had loculated effusion or unable to reduce effusion with diuresis would need thoracentesis.   Diabetes type 2 controlled with complication - 1/61 hemoglobin A1c= 6.8 -5/1 Lantus 10 units daily -5/2 NovoLog 4 units QAC - Sensitive SSI -Patient's diet was advanced to carb modified.  Again warned patient to take it very slowly given her recent surgery.    Hypokalemia -Potassium goal> 4 - Potassium 50 mEq - Repeat K/Mg 1500  Hypomagnesmia -Magnesium goal> 2 - Magnesium 2 g   Abnormal LFTs  -Resolved   Elevated troponin:  -Troponin 0.08, no chest pain.  Likely due to demand ischemia secondary to sepsis and atrial fibrillation with RVR. - cycle CE q6 x3 and repeat EKG in the am  Recent Labs  Lab 03/10/19 2058 03/11/19 0238 03/11/19 0557  TROPONINI 0.08* 0.06* 0.05*       DVT prophylaxis: Heparin Code Status: Full Family Communication: None Disposition Plan: TBD    Consultants:  IR  [facility] Oncology    Procedures/Significant Events:  12/27/2018 Dr. [name] S/plaparotomy with mobilization of the splenic flexure and resection of the sigmoid, descending and distal transverse colon with a Hartman's pouch  at the end of the colostomy in the right upper quadrant, 2/14 surgical pathology adenocarcinoma of proximal descending colon, sigmoid mass consistent with large villous adenoma 3/20 abdominal CT from SNF: Large rim-enhancing fluid connection with multiple foci over the lower abdomen with a likely intra-abdominal abscess She was readmitted and under IR drain 01/31/19, Dr. Earleen Newport.  She was transitioned to oral abx and discharged again on 02/09/19.  CT 01/31/19 : 12.6 x 24.6 x 18.4 cm enhancing fluid collection lower abdomen extending to the mid left upper abdomen, likely intra-abdominal abscess IR drain placement 01/31/2019 CT 03/05/2019: Abscess cavity measured 18.1 x 15 x 3.3 cm.  Previously this measured 12.6 x 24.6 x 18.4 drain was positioned within the left side of the abscess cavity abscess did not extend centrally toward midline.  There is also suspected upper abscess measuring 13.8 x 9.4 improved from prior measurement of 16.6 x 8.4 CT 03/08/2018 showed the prior drain have been removed.  There is a persistent fluid collection left lower abdomen extending in central pelvis collection measured 20.2 x 3.9 with  mildly increased size from 19.4 x 2.3 on the last scan.  No new fluid collections.  Caudate lobe of the liver shows metastases without significant interval change.  No new or progressive metastatic disease was identified.  Stable left pleural effusion and left lower lobe atelectasis. Chest x-ray showed: 1. Persistent small to moderate left pleural effusion with associated left basilar opacity. 2. Cardiomegaly with underlying mild diffuse pulmonary interstitial edema. 3. Aortic atherosclerosis. 4/29 CT guided drainage intra-abdominal abscess collection:-150 ml green purulent looking fluid removed from the complex mid upper collection after 12 Fr drain placement -30 ml of yellow purulent fluid removed from left lower abscess collection after placement of 12 Fr drain.   4/30 PCXR- slightly increased  moderate LEFT pleural effusion 5/1 CT chest W. Wo contrast:Left larger than right pleural effusions. Left greater than right base collapse/consolidation. Favor atelectasis at the right lower lobe. The left lower lobe process could represent atelectasis or infection. 2. Coronary artery atherosclerosis. Aortic Atherosclerosis (ICD10-I70.0). 3. Pulmonary artery enlargement suggests pulmonary arterial hypertension. 4. Small volume upper abdominal ascites, increased from 03/10/2019. 5. 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     I have personally reviewed and interpreted all radiology studies and my findings are as above.  VENTILATOR SETTINGS:    Cultures 4/28 MRSA by PCR negative 4/28 abscess drainage LLQ incubated for better growth 4/28 abscess drainage middle upper abdomen reintubated for better growth 5/1SARS-COV-2 negative    Antimicrobials: Anti-infectives (From admission, onward)   Start     Stop   03/11/19 1000  ceFEPIme (MAXIPIME) 2 g in sodium chloride 0.9 % 100 mL IVPB         03/11/19 0900  metroNIDAZOLE (FLAGYL) IVPB 500 mg         03/10/19 2100  ceFEPIme (MAXIPIME) 2 g in sodium chloride 0.9 % 100 mL IVPB     03/10/19 2219   03/10/19 2100  metroNIDAZOLE (FLAGYL) IVPB 500 mg     03/11/19 0114       Devices    LINES / TUBES:  12 French drain mid upper abdomen 4/29>> 12 French drain left lower abdomen 4/29>>     Continuous Infusions:  sodium chloride Stopped (03/14/19 0900)   sodium chloride 75 mL/hr at 03/13/19 0500   ceFEPime (MAXIPIME) IV Stopped (03/14/19 2222)   heparin 1,600 Units/hr (03/15/19 0712)   metronidazole Stopped (03/15/19 0232)     Objective: Vitals:   03/14/19 1619 03/14/19 1952 03/14/19 2300 03/15/19 0430  BP: 130/79 130/80 (!) 145/97 (!) 144/99  Pulse: 100 (!) 102 (!) 107 98  Resp: (!) 24 (!) 34 19 (!) 21  Temp: 98.4 F (36.9 C) 97.7 F (36.5 C) 98.2 F (36.8 C) 98.1 F (36.7 C)    TempSrc: Oral Oral Oral Oral  SpO2: 99% 100% 100% 99%  Weight:      Height:        Intake/Output Summary (Last 24 hours) at 03/15/2019 0754 Last data filed at 03/15/2019 0600 Gross per 24 hour  Intake 1478.61 ml  Output 4843.5 ml  Net -3364.89 ml   Filed Weights   03/12/19 0446 03/13/19 0500 03/14/19 0126  Weight: 79.8 kg 81.7 kg 80.9 kg    General: A/O x4, no acute respiratory distress Eyes: negative scleral hemorrhage, negative anisocoria, negative icterus ENT: Negative Runny nose, negative gingival bleeding, Neck:  Negative scars, masses, torticollis, lymphadenopathy, JVD Lungs: Clear to auscultation right lung fields, absent breath sounds left lung fields, without wheezes  or crackles Cardiovascular: Irregular irregular rhythm and rate, without murmur gallop or rub normal S1 and S2 Abdomen: Morbidly obese, positive abdominal pain, nondistended, positive soft, bowel sounds, no rebound, no ascites, no appreciable mass, JP drain x2 in place RLQ/L LQ.  Draining mostly straw-colored fluid.  Ostomy bag in place negative sign of infection or leak around the seal. Extremities: No significant cyanosis, clubbing.  Positive 1+, or edema bilateral lower extremities Skin: Negative rashes, lesions, ulcers Psychiatric:  Negative depression, negative anxiety, negative fatigue, negative mania  Central nervous system:  Cranial nerves II through XII intact, tongue/uvula midline, all extremities muscle strength 5/5, sensation intact throughout, negative dysarthria, negative expressive aphasia, negative receptive aphasia.       Data Reviewed: Care during the described time interval was provided by me .  I have reviewed this patient's available data, including medical history, events of note, physical examination, and all test results as part of my evaluation.   CBC: Recent Labs  Lab 03/10/19 2058  03/11/19 0557 03/12/19 0437 03/13/19 0550 03/14/19 0350 03/15/19 0500  WBC 12.7*  --  15.2*  10.6* 10.4 11.4* 8.4  NEUTROABS 11.8*  --   --   --   --   --   --   HGB 11.1*   < > 8.7* 9.4* 9.9* 9.7* 8.7*  HCT 38.6   < > 30.9* 33.2* 35.9* 35.4* 31.3*  MCV 91.9  --  94.5 95.1 96.0 95.9 94.8  PLT 359  --  299 278 326 350 303   < > = values in this interval not displayed.   Basic Metabolic Panel: Recent Labs  Lab 03/11/19 0238 03/11/19 0557 03/12/19 0437 03/13/19 0550 03/14/19 0350 03/15/19 0500  NA  --  141 140 139 139 142  K  --  3.7 3.9 3.8 3.5 2.8*  CL  --  104 104 107 108 104  CO2  --  27 27 25 24 29   GLUCOSE  --  114* 136* 160* 241* 241*  BUN  --  20 19 17 12 10   CREATININE  --  0.83 0.71 0.71 0.74 0.81  CALCIUM  --  8.0* 8.1* 7.9* 8.0* 8.2*  MG 1.1*  --  2.2 1.8 1.7 1.6*  PHOS  --  4.0 3.7 3.5  --   --    GFR: Estimated Creatinine Clearance: 61.8 mL/min (by C-G formula based on SCr of 0.81 mg/dL). Liver Function Tests: Recent Labs  Lab 03/10/19 2058 03/14/19 0350  AST 237* 12*  ALT 95* 42  ALKPHOS 1,377* 528*  BILITOT 2.3* 1.0  PROT 6.0* 5.3*  ALBUMIN 2.5* 2.1*   Recent Labs  Lab 03/10/19 2058  LIPASE 38   No results for input(s): AMMONIA in the last 168 hours. Coagulation Profile: Recent Labs  Lab 03/11/19 0238  INR 2.1*   Cardiac Enzymes: Recent Labs  Lab 03/10/19 2058 03/11/19 0238 03/11/19 0557  TROPONINI 0.08* 0.06* 0.05*   BNP (last 3 results) No results for input(s): PROBNP in the last 8760 hours. HbA1C: No results for input(s): HGBA1C in the last 72 hours. CBG: Recent Labs  Lab 03/14/19 0659 03/14/19 1107 03/14/19 1620 03/14/19 2129 03/15/19 0616  GLUCAP 253* 130* 184* 262* 197*   Lipid Profile: No results for input(s): CHOL, HDL, LDLCALC, TRIG, CHOLHDL, LDLDIRECT in the last 72 hours. Thyroid Function Tests: No results for input(s): TSH, T4TOTAL, FREET4, T3FREE, THYROIDAB in the last 72 hours. Anemia Panel: Recent Labs    03/14/19 0350  FERRITIN 446*  TIBC 167*  IRON 100   Urine analysis:    Component Value  Date/Time   COLORURINE AMBER (A) 03/10/2019 0041   APPEARANCEUR CLEAR 03/10/2019 0041   LABSPEC 1.032 (H) 03/10/2019 0041   PHURINE 5.0 03/10/2019 0041   GLUCOSEU NEGATIVE 03/10/2019 0041   HGBUR SMALL (A) 03/10/2019 0041   BILIRUBINUR NEGATIVE 03/10/2019 0041   KETONESUR NEGATIVE 03/10/2019 0041   PROTEINUR 30 (A) 03/10/2019 0041   UROBILINOGEN 0.2 03/25/2015 2317   NITRITE POSITIVE (A) 03/10/2019 0041   LEUKOCYTESUR MODERATE (A) 03/10/2019 0041   Sepsis Labs: @LABRCNTIP (procalcitonin:4,lacticidven:4)  ) Recent Results (from the past 240 hour(s))  Urine culture     Status: None   Collection Time: 03/10/19  8:41 PM  Result Value Ref Range Status   Specimen Description URINE, CATHETERIZED  Final   Special Requests NONE  Final   Culture   Final    NO GROWTH Performed at [facility], [place] [address], [place], [place] [address]    Report Status 03/13/2019 FINAL  Final  Blood Culture (routine x 2)     Status: Abnormal   Collection Time: 03/10/19  9:37 PM  Result Value Ref Range Status   Specimen Description BLOOD RIGHT HAND  Final   Special Requests   Final    BOTTLES DRAWN AEROBIC AND ANAEROBIC Blood Culture results may not be optimal due to an inadequate volume of blood received in culture bottles   Culture  Setup Time   Final    GRAM NEGATIVE RODS IN BOTH AEROBIC AND ANAEROBIC BOTTLES CRITICAL RESULT CALLED TO, READ BACK BY AND VERIFIED WITH: Cristopher Estimable PharmD 11:10 03/11/19 ([email or web address]) Performed at [facility], [place] [address], [place], [place] [address]    Culture ESCHERICHIA COLI (A)  Final   Report Status 03/13/2019 FINAL  Final   Organism ID, Bacteria ESCHERICHIA COLI  Final      Susceptibility   Escherichia coli - MIC*    AMPICILLIN >=32 RESISTANT Resistant     CEFAZOLIN <=4 SENSITIVE Sensitive     CEFEPIME <=1 SENSITIVE Sensitive     CEFTAZIDIME <=1 SENSITIVE Sensitive     CEFTRIAXONE <=1 SENSITIVE Sensitive     CIPROFLOXACIN <=0.25 SENSITIVE  Sensitive     GENTAMICIN <=1 SENSITIVE Sensitive     IMIPENEM <=0.25 SENSITIVE Sensitive     TRIMETH/SULFA <=20 SENSITIVE Sensitive     AMPICILLIN/SULBACTAM >=32 RESISTANT Resistant     PIP/TAZO <=4 SENSITIVE Sensitive     Extended ESBL NEGATIVE Sensitive     * ESCHERICHIA COLI  Blood Culture ID Panel (Reflexed)     Status: Abnormal   Collection Time: 03/10/19  9:37 PM  Result Value Ref Range Status   Enterococcus species NOT DETECTED NOT DETECTED Final   Listeria monocytogenes NOT DETECTED NOT DETECTED Final   Staphylococcus species NOT DETECTED NOT DETECTED Final   Staphylococcus aureus (BCID) NOT DETECTED NOT DETECTED Final   Streptococcus species NOT DETECTED NOT DETECTED Final   Streptococcus agalactiae NOT DETECTED NOT DETECTED Final   Streptococcus pneumoniae NOT DETECTED NOT DETECTED Final   Streptococcus pyogenes NOT DETECTED NOT DETECTED Final   Acinetobacter baumannii NOT DETECTED NOT DETECTED Final   Enterobacteriaceae species DETECTED (A) NOT DETECTED Final    Comment: Enterobacteriaceae represent a large family of gram-negative bacteria, not a single organism. CRITICAL RESULT CALLED TO, READ BACK BY AND VERIFIED WITH: Cristopher Estimable PharmD 11:10 03/11/19 ([email or web address])    Enterobacter cloacae complex NOT DETECTED NOT DETECTED Final  Escherichia coli DETECTED (A) NOT DETECTED Final    Comment: CRITICAL RESULT CALLED TO, READ BACK BY AND VERIFIED WITH: Cristopher Estimable PharmD 11:10 03/11/19 ([email or web address])    Klebsiella oxytoca NOT DETECTED NOT DETECTED Final   Klebsiella pneumoniae NOT DETECTED NOT DETECTED Final   Proteus species NOT DETECTED NOT DETECTED Final   Serratia marcescens NOT DETECTED NOT DETECTED Final   Carbapenem resistance NOT DETECTED NOT DETECTED Final   Haemophilus influenzae NOT DETECTED NOT DETECTED Final   Neisseria meningitidis NOT DETECTED NOT DETECTED Final   Pseudomonas aeruginosa NOT DETECTED NOT DETECTED Final   Candida albicans NOT DETECTED NOT DETECTED  Final   Candida glabrata NOT DETECTED NOT DETECTED Final   Candida krusei NOT DETECTED NOT DETECTED Final   Candida parapsilosis NOT DETECTED NOT DETECTED Final   Candida tropicalis NOT DETECTED NOT DETECTED Final    Comment: Performed at [facility], [place] [address], [place], [place] [address]  Blood Culture (routine x 2)     Status: Abnormal   Collection Time: 03/10/19  9:50 PM  Result Value Ref Range Status   Specimen Description BLOOD LEFT ARM  Final   Special Requests   Final    BOTTLES DRAWN AEROBIC AND ANAEROBIC Blood Culture adequate volume   Culture  Setup Time   Final    GRAM NEGATIVE RODS IN BOTH AEROBIC AND ANAEROBIC BOTTLES CRITICAL VALUE NOTED.  VALUE IS CONSISTENT WITH PREVIOUSLY REPORTED AND CALLED VALUE.    Culture (A)  Final    ESCHERICHIA COLI SUSCEPTIBILITIES PERFORMED ON PREVIOUS CULTURE WITHIN THE LAST 5 DAYS. Performed at [facility], [place] [address], [place], [place] [address]    Report Status 03/13/2019 FINAL  Final  MRSA PCR Screening     Status: None   Collection Time: 03/11/19  3:47 AM  Result Value Ref Range Status   MRSA by PCR NEGATIVE NEGATIVE Final    Comment:        The GeneXpert MRSA Assay (FDA approved for NASAL specimens only), is one component of a comprehensive MRSA colonization surveillance program. It is not intended to diagnose MRSA infection nor to guide or monitor treatment for MRSA infections. Performed at [facility], [place] [address], [place], [place] [address]   Aerobic/Anaerobic Culture (surgical/deep wound)     Status: None (Preliminary result)   Collection Time: 03/11/19  3:00 PM  Result Value Ref Range Status   Specimen Description ABSCESS DRAINAGE  Final   Special Requests MID UPPER ABDOMEN  Final   Gram Stain   Final    ABUNDANT WBC PRESENT,BOTH PMN AND MONONUCLEAR MODERATE GRAM VARIABLE ROD FEW GRAM NEGATIVE RODS Performed at [facility], [place] [address], [place], [place] [address]     Culture   Final    MODERATE PROTEUS MIRABILIS FEW ENTEROCOCCUS FAECALIS SUSCEPTIBILITIES TO FOLLOW NO ANAEROBES ISOLATED; CULTURE IN PROGRESS FOR 5 DAYS    Report Status PENDING  Incomplete   Organism ID, Bacteria PROTEUS MIRABILIS  Final      Susceptibility   Proteus mirabilis - MIC*    AMPICILLIN <=2 SENSITIVE Sensitive     CEFAZOLIN <=4 SENSITIVE Sensitive     CEFEPIME <=1 SENSITIVE Sensitive     CEFTAZIDIME <=1 SENSITIVE Sensitive     CEFTRIAXONE <=1 SENSITIVE Sensitive     CIPROFLOXACIN <=0.25 SENSITIVE Sensitive     GENTAMICIN <=1 SENSITIVE Sensitive     IMIPENEM 2 SENSITIVE Sensitive     TRIMETH/SULFA <=20 SENSITIVE Sensitive  AMPICILLIN/SULBACTAM <=2 SENSITIVE Sensitive     PIP/TAZO <=4 SENSITIVE Sensitive     * MODERATE PROTEUS MIRABILIS  Aerobic/Anaerobic Culture (surgical/deep wound)     Status: None (Preliminary result)   Collection Time: 03/11/19  3:00 PM  Result Value Ref Range Status   Specimen Description ABSCESS DRAINAGE  Final   Special Requests LEFT LOWER ABDOMEN  Final   Gram Stain   Final    ABUNDANT WBC PRESENT,BOTH PMN AND MONONUCLEAR RARE GRAM POSITIVE COCCI FEW GRAM NEGATIVE RODS FEW GRAM VARIABLE ROD Performed at [facility], [place] [address], [place], [place] [address]    Culture   Final    MODERATE PROTEUS MIRABILIS NO ANAEROBES ISOLATED; CULTURE IN PROGRESS FOR 5 DAYS    Report Status PENDING  Incomplete   Organism ID, Bacteria PROTEUS MIRABILIS  Final      Susceptibility   Proteus mirabilis - MIC*    AMPICILLIN <=2 SENSITIVE Sensitive     CEFAZOLIN <=4 SENSITIVE Sensitive     CEFEPIME <=1 SENSITIVE Sensitive     CEFTAZIDIME <=1 SENSITIVE Sensitive     CEFTRIAXONE <=1 SENSITIVE Sensitive     CIPROFLOXACIN <=0.25 SENSITIVE Sensitive     GENTAMICIN <=1 SENSITIVE Sensitive     IMIPENEM 2 SENSITIVE Sensitive     TRIMETH/SULFA <=20 SENSITIVE Sensitive     AMPICILLIN/SULBACTAM <=2 SENSITIVE Sensitive     PIP/TAZO <=4 SENSITIVE  Sensitive     * MODERATE PROTEUS MIRABILIS  Novel Coronavirus, NAA (hospital order; send-out to ref lab)     Status: None   Collection Time: 03/14/19  3:35 PM  Result Value Ref Range Status   SARS-CoV-2, NAA NOT DETECTED NOT DETECTED Final    Comment: (NOTE) This test was developed and its performance characteristics determined by [employer]. This test has not been FDA cleared or approved. This test has been authorized by FDA under an Emergency Use Authorization (EUA). This test is only authorized for the duration of time the declaration that circumstances exist justifying the authorization of the emergency use of in vitro diagnostic tests for detection of SARS-CoV-2 virus and/or diagnosis of COVID-19 infection under section 564(b)(1) of the Act, 21 U.S.C. 706CBJ-6(E)(8), unless the authorization is terminated or revoked sooner. When diagnostic testing is negative, the possibility of a false negative result should be considered in the context of a patient's recent exposures and the presence of clinical signs and symptoms consistent with COVID-19. An individual without symptoms of COVID-19 and who is not shedding SARS-CoV-2 virus would expect to have a negative (not detected) result in this assay. Performed  At: [facility] [address] [place], [place] [id number] Rush Farmer MD VP:[id number]    [place]  Final    Comment: Performed at [facility], [place] [address] [place], [place] [address]         Radiology Studies: Ct Chest Wo Contrast  Result Date: 03/14/2019 CLINICAL DATA:  Chest pain. Pleural effusion. Pulmonary origin of pain suspected. EXAM: CT CHEST WITHOUT CONTRAST TECHNIQUE: Multidetector CT imaging of the chest was performed following the standard protocol without IV contrast. COMPARISON:  Chest radiographs, most recent 1 day prior. Chest CT 11/01/2018. FINDINGS: Cardiovascular: Aortic and branch vessel  atherosclerosis. Moderate cardiomegaly, without pericardial effusion. Multivessel coronary artery atherosclerosis. Pulmonary artery enlargement, outflow tract 3.3 cm. Right Port-A-Cath tip at high right atrium. Mediastinum/Nodes: Hypoattenuating left-sided thyroid nodule on the order of 2.1 cm, grossly similar. No supraclavicular adenopathy. 11 mm right paratracheal node is  minimally decreased from 12 mm on the prior. Hilar regions poorly evaluated without intravenous contrast. Lungs/Pleura: Moderate left and small right pleural effusions. Mild motion degradation throughout. Left greater than right base collapse/consolidation. Upper Abdomen: Previously described liver lesions are not well evaluated on this noncontrast exam. Normal imaged portions of the spleen, pancreas, stomach. Percutaneous catheter within an upper abdominal gas and fluid collection, incompletely imaged. Perihepatic small volume ascites is new since 11/01/2018. Incompletely imaged left adrenal nodule measures 3.1 x 3.0 cm today versus 2.9 x 3.1 cm on the prior. Musculoskeletal: Moderate thoracic spondylosis. IMPRESSION: 1. Left larger than right pleural effusions. Left greater than right base collapse/consolidation. Favor atelectasis at the right lower lobe. The left lower lobe process could represent atelectasis or infection. 2. Coronary artery atherosclerosis. Aortic Atherosclerosis (ICD10-I70.0). 3. Pulmonary artery enlargement suggests pulmonary arterial hypertension. 4. Small volume upper abdominal ascites, increased from 03/10/2019. 5. 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 Electronically Signed   By: Abigail Miyamoto M.D.   On: 03/14/2019 12:29   Dg Chest Port 1 View  Result Date: 03/13/2019 CLINICAL DATA:  Shortness of breath. EXAM: PORTABLE CHEST 1 VIEW COMPARISON:  Chest x-rays dated 03/10/2019, 02/05/2019 and 12/31/2018 FINDINGS: Power port in place in good position with the tip at the cavoatrial  junction. Moderate left pleural effusion has slightly increased. Overall heart size and pulmonary vascularity are normal. Right lung is clear. No acute bone abnormality. Aortic atherosclerosis. Abscess drainage catheter in the left mid abdomen. IMPRESSION: Slightly increased moderate left pleural effusion. Aortic Atherosclerosis (ICD10-I70.0). Electronically Signed   By: Lorriane Shire M.D.   On: 03/13/2019 11:51        Scheduled Meds:  atorvastatin  10 mg Oral QHS   diltiazem  240 mg Oral Daily   insulin aspart  0-9 Units Subcutaneous TID WC   insulin glargine  10 Units Subcutaneous Daily   pantoprazole (PROTONIX) IV  40 mg Intravenous QHS   sertraline  125 mg Oral Daily   sodium chloride flush  5 mL Intracatheter Q8H   Continuous Infusions:  sodium chloride Stopped (03/14/19 0900)   sodium chloride 75 mL/hr at 03/13/19 0500   ceFEPime (MAXIPIME) IV Stopped (03/14/19 2222)   heparin 1,600 Units/hr (03/15/19 2951)   metronidazole Stopped (03/15/19 0232)     LOS: 4 days   The patient is critically ill with multiple organ systems failure and requires high complexity decision making for assessment and support, frequent evaluation and titration of therapies, application of advanced monitoring technologies and extensive interpretation of multiple databases. Critical Care Time devoted to patient care services described in this note  Time spent: 40 minutes     Vonetta Foulk, Geraldo Docker, MD [facility] Pager [phone]  If 7PM-7AM, please contact night-coverage [email or web address] Password [facility] 03/15/2019, 7:54 AM

## 2019-03-15 NOTE — Progress Notes (Signed)
Subjective: Patient continues to feel better overnight. Minimal abdominal pain. Tolerated carb modified diet yesterday. Was OOB in chair all of yesterday per nurse. Producing tool and flatus in colostomy.   Objective: Vital signs in last 24 hours: Temp:  [97.7 F (36.5 C)-98.4 F (36.9 C)] 98.1 F (36.7 C) (05/02 0430) Pulse Rate:  [44-107] 98 (05/02 0430) Resp:  [19-34] 21 (05/02 0430) BP: (130-145)/(79-99) 144/99 (05/02 0430) SpO2:  [94 %-100 %] 99 % (05/02 0430) Last BM Date: 03/14/19  Intake/Output from previous day: 05/01 0701 - 05/02 0700 In: 1478.6 [P.O.:220; I.V.:539.4; IV Piggyback:699.2] Out: 4843.5 [Urine:4450; Drains:43.5; Stool:350] Intake/Output this shift: No intake/output data recorded.  PE: Gen: Awake and alert, NAD Lungs: Normal effort Abd: soft, appropriately tender and tender around drains, +BS, both drains with decreasing output. Epigastric drain still with purulent drainage and some greasy drainage.  LLQ with purulence mixed seropurulent output.  Ostomy working well with good output and gas in bag. Scab above umbilicus.   Lab Results:  Recent Labs    03/14/19 0350 03/15/19 0500  WBC 11.4* 8.4  HGB 9.7* 8.7*  HCT 35.4* 31.3*  PLT 350 303   BMET Recent Labs    03/14/19 0350 03/15/19 0500  NA 139 142  K 3.5 2.8*  CL 108 104  CO2 24 29  GLUCOSE 241* 241*  BUN 12 10  CREATININE 0.74 0.81  CALCIUM 8.0* 8.2*   PT/INR No results for input(s): LABPROT, INR in the last 72 hours. CMP     Component Value Date/Time   NA 142 03/15/2019 0500   NA 143 02/14/2019   K 2.8 (L) 03/15/2019 0500   CL 104 03/15/2019 0500   CL 101 02/14/2019   CO2 29 03/15/2019 0500   CO2 23 02/14/2019   GLUCOSE 241 (H) 03/15/2019 0500   BUN 10 03/15/2019 0500   BUN 25 (A) 02/14/2019   CREATININE 0.81 03/15/2019 0500   CREATININE 1.04 (H) 12/18/2018 1029   CALCIUM 8.2 (L) 03/15/2019 0500   CALCIUM 8.7 02/14/2019   PROT 5.3 (L) 03/14/2019 0350   PROT 5.3  (A) 01/23/2019   PROT 5.3 (A) 01/23/2019   ALBUMIN 2.1 (L) 03/14/2019 0350   ALBUMIN 2.4 01/23/2019   ALBUMIN 2.4 01/23/2019   AST 12 (L) 03/14/2019 0350   AST 60 (H) 12/18/2018 1029   ALT 42 03/14/2019 0350   ALT 117 (H) 12/18/2018 1029   ALKPHOS 528 (H) 03/14/2019 0350   BILITOT 1.0 03/14/2019 0350   BILITOT 0.5 12/18/2018 1029   GFRNONAA >60 03/15/2019 0500   GFRNONAA 54 (L) 12/18/2018 1029   GFRAA >60 03/15/2019 0500   GFRAA >60 12/18/2018 1029   Lipase     Component Value Date/Time   LIPASE 38 03/10/2019 2058       Studies/Results: Ct Chest Wo Contrast  Result Date: 03/14/2019 CLINICAL DATA:  Chest pain. Pleural effusion. Pulmonary origin of pain suspected. EXAM: CT CHEST WITHOUT CONTRAST TECHNIQUE: Multidetector CT imaging of the chest was performed following the standard protocol without IV contrast. COMPARISON:  Chest radiographs, most recent 1 day prior. Chest CT 11/01/2018. FINDINGS: Cardiovascular: Aortic and branch vessel atherosclerosis. Moderate cardiomegaly, without pericardial effusion. Multivessel coronary artery atherosclerosis. Pulmonary artery enlargement, outflow tract 3.3 cm. Right Port-A-Cath tip at high right atrium. Mediastinum/Nodes: Hypoattenuating left-sided thyroid nodule on the order of 2.1 cm, grossly similar. No supraclavicular adenopathy. 11 mm right paratracheal node is minimally decreased from 12 mm on the prior. Hilar regions poorly  evaluated without intravenous contrast. Lungs/Pleura: Moderate left and small right pleural effusions. Mild motion degradation throughout. Left greater than right base collapse/consolidation. Upper Abdomen: Previously described liver lesions are not well evaluated on this noncontrast exam. Normal imaged portions of the spleen, pancreas, stomach. Percutaneous catheter within an upper abdominal gas and fluid collection, incompletely imaged. Perihepatic small volume ascites is new since 11/01/2018. Incompletely imaged left  adrenal nodule measures 3.1 x 3.0 cm today versus 2.9 x 3.1 cm on the prior. Musculoskeletal: Moderate thoracic spondylosis. IMPRESSION: 1. Left larger than right pleural effusions. Left greater than right base collapse/consolidation. Favor atelectasis at the right lower lobe. The left lower lobe process could represent atelectasis or infection. 2. Coronary artery atherosclerosis. Aortic Atherosclerosis (ICD10-I70.0). 3. Pulmonary artery enlargement suggests pulmonary arterial hypertension. 4. Small volume upper abdominal ascites, increased from 03/10/2019. 5. 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 Electronically Signed   By: Abigail Miyamoto M.D.   On: 03/14/2019 12:29   Dg Chest Port 1 View  Result Date: 03/13/2019 CLINICAL DATA:  Shortness of breath. EXAM: PORTABLE CHEST 1 VIEW COMPARISON:  Chest x-rays dated 03/10/2019, 02/05/2019 and 12/31/2018 FINDINGS: Power port in place in good position with the tip at the cavoatrial junction. Moderate left pleural effusion has slightly increased. Overall heart size and pulmonary vascularity are normal. Right lung is clear. No acute bone abnormality. Aortic atherosclerosis. Abscess drainage catheter in the left mid abdomen. IMPRESSION: Slightly increased moderate left pleural effusion. Aortic Atherosclerosis (ICD10-I70.0). Electronically Signed   By: Lorriane Shire M.D.   On: 03/13/2019 11:51    Anti-infectives: Anti-infectives (From admission, onward)   Start     Dose/Rate Route Frequency Ordered Stop   03/11/19 1000  ceFEPIme (MAXIPIME) 2 g in sodium chloride 0.9 % 100 mL IVPB     2 g 200 mL/hr over 30 Minutes Intravenous Every 12 hours 03/10/19 2353     03/11/19 0900  metroNIDAZOLE (FLAGYL) IVPB 500 mg     500 mg 100 mL/hr over 60 Minutes Intravenous Every 8 hours 03/11/19 0101     03/10/19 2100  ceFEPIme (MAXIPIME) 2 g in sodium chloride 0.9 % 100 mL IVPB     2 g 200 mL/hr over 30 Minutes Intravenous  Once 03/10/19 2058  03/10/19 2219   03/10/19 2100  metroNIDAZOLE (FLAGYL) IVPB 500 mg     500 mg 100 mL/hr over 60 Minutes Intravenous  Once 03/10/19 2058 03/11/19 0114       Assessment/Plan Atrial fibrillation with rapid ventricular rate-on Eliquis  Chronic kidney disease stage III Type 2 diabetes-controlled Essential hypertension Depression Hyperlipidemia Anemia - Hgb 8.7 this AM + Blood Cx's - E. Coli   Sepsissecondary to intra-abdominal abscess. Adenocarcinoma of the colon with chemotherapy; complete obstruction; liver metastasis. S/plaparotomy with mobilization of the splenic flexure and resection of the sigmoid, descending and distal transverse colon with a Hartman's pouch at the end of the colostomy in the right upper quadrant,12/27/2018 Dr. [name]. - perc drains by IR4/47for both abscesses. - upper drain CXs -proteus mirabilis  - LLQ drain CX -moderate proteus mirabilis - Tolerating diet   - cont drains (purulet drainage) and abx therapy (sensitive on Cxs) - patient is surgically stable at this point  QVZ:DGLO mod diet, K 2.8 & Mg 1.6 (being replaced) ID: Cefepime 4/27 >>Flagyl 4/27 >> WBC 8.4 DVT: SCD's;heparin gtt Follow up: Dr. [name] POC: Armentha, Branagan [name] - Son - [phone]   LOS: 4 days    Legrand Como  Evette Cristal , [facility] 03/15/2019, 8:08 AM Pager: [phone]

## 2019-03-15 NOTE — Progress Notes (Signed)
[facility] for Apxiaban to Heparin Indication: atrial fibrillation  Allergies  Allergen Reactions  . Metformin And Related Nausea And Vomiting  . Prednisone Nausea And Vomiting    Patient Measurements: Height: 5\' 2"  (157.5 cm) Weight: 178 lb 5.6 oz (80.9 kg) IBW/kg (Calculated) : 50.1  Vital Signs: Temp: 98.1 F (36.7 C) (05/02 0430) Temp Source: Oral (05/02 0430) BP: 144/99 (05/02 0430) Pulse Rate: 98 (05/02 0430)  Labs: Recent Labs    03/12/19 2048  03/13/19 0550 03/14/19 0349 03/14/19 0350 03/15/19 0500  HGB  --    < > 9.9*  --  9.7* 8.7*  HCT  --   --  35.9*  --  35.4* 31.3*  PLT  --   --  326  --  350 303  APTT 60*  --  73*  --  72*  --   HEPARINUNFRC  --   --  0.56 0.29*  --  0.24*  CREATININE  --   --  0.71  --  0.74  --    < > = values in this interval not displayed.    Estimated Creatinine Clearance: 62.6 mL/min (by C-G formula based on SCr of 0.74 mg/dL).   Medical History: Past Medical History:  Diagnosis Date  . Acute diastolic heart failure ([place]) 12/22/2018  . Arthritis   . Atrial fibrillation, chronic 12/22/2018  . Cancer of left colon ([place]) 10/30/2018  . Cancer of sigmoid colon  12/27/2018  . Diabetes mellitus without complication ([place])   . Hypertension   . Obesity (BMI 30-39.9) 12/27/2018    Assessment: 47 yoF on apixaban at home for AFib start on IV heparin with need for IR procedures. Heparin level now subtherapeutc and aPTT therapeutic suggesting DOAC has cleared - will dose with heparin levels now. CBC remains stable.   Goal of Therapy:  Heparin level 0.3-0.7 units/ml Monitor platelets by anticoagulation protocol: Yes    Plan:  -Increase heparin to 1600 units/hr -Transition back to apixaban once all procedures completed -Daily heparin level and CBC   Arrie Senate, PharmD, BCPS Clinical Pharmacist [phone] Please check AMION for all [facility] numbers 03/15/2019

## 2019-03-15 NOTE — Plan of Care (Signed)
  Problem: Clinical Measurements: Goal: Ability to maintain clinical measurements within normal limits will improve Outcome: Progressing   Problem: Clinical Measurements: Goal: Respiratory complications will improve Outcome: Progressing   Problem: Clinical Measurements: Goal: Cardiovascular complication will be avoided Outcome: Progressing   

## 2019-03-16 DIAGNOSIS — J90 Pleural effusion, not elsewhere classified: Secondary | ICD-10-CM

## 2019-03-16 LAB — AEROBIC/ANAEROBIC CULTURE W GRAM STAIN (SURGICAL/DEEP WOUND)

## 2019-03-16 LAB — CBC
HCT: 34.1 % — ABNORMAL LOW (ref 36.0–46.0)
Hemoglobin: 9.6 g/dL — ABNORMAL LOW (ref 12.0–15.0)
MCH: 26.3 pg (ref 26.0–34.0)
MCHC: 28.2 g/dL — ABNORMAL LOW (ref 30.0–36.0)
MCV: 93.4 fL (ref 80.0–100.0)
Platelets: 303 10*3/uL (ref 150–400)
RBC: 3.65 MIL/uL — ABNORMAL LOW (ref 3.87–5.11)
RDW: 18.1 % — ABNORMAL HIGH (ref 11.5–15.5)
WBC: 7.9 10*3/uL (ref 4.0–10.5)
nRBC: 0 % (ref 0.0–0.2)

## 2019-03-16 LAB — BASIC METABOLIC PANEL
Anion gap: 7 (ref 5–15)
BUN: 11 mg/dL (ref 8–23)
CO2: 34 mmol/L — ABNORMAL HIGH (ref 22–32)
Calcium: 8.4 mg/dL — ABNORMAL LOW (ref 8.9–10.3)
Chloride: 102 mmol/L (ref 98–111)
Creatinine, Ser: 0.67 mg/dL (ref 0.44–1.00)
GFR calc Af Amer: >60 mL/min (ref >60)
GFR calc non Af Amer: >60 mL/min (ref >60)
Glucose, Bld: 113 mg/dL — ABNORMAL HIGH (ref 70–99)
Potassium: 2.9 mmol/L — ABNORMAL LOW (ref 3.5–5.1)
Sodium: 143 mmol/L (ref 135–145)

## 2019-03-16 LAB — GLUCOSE, CAPILLARY
Glucose-Capillary: 94 mg/dL (ref 70–99)
Glucose-Capillary: 254 mg/dL — ABNORMAL HIGH (ref 70–99)
Glucose-Capillary: 210 mg/dL — ABNORMAL HIGH (ref 70–99)
Glucose-Capillary: 240 mg/dL — ABNORMAL HIGH (ref 70–99)

## 2019-03-16 LAB — MAGNESIUM: Magnesium: 1.6 mg/dL — ABNORMAL LOW (ref 1.7–2.4)

## 2019-03-16 LAB — AEROBIC/ANAEROBIC CULTURE (SURGICAL/DEEP WOUND)

## 2019-03-16 LAB — HEPARIN LEVEL (UNFRACTIONATED): Heparin Unfractionated: 0.35 IU/mL (ref 0.30–0.70)

## 2019-03-16 LAB — ALBUMIN: Albumin: 2.8 g/dL — ABNORMAL LOW (ref 3.5–5.0)

## 2019-03-16 MED ORDER — FUROSEMIDE 10 MG/ML IJ SOLN
40.0000 mg | Freq: Two times a day (BID) | INTRAMUSCULAR | Status: DC
  Administered 2019-03-16 – 2019-03-17 (×2): 40 mg via INTRAVENOUS
  Filled 2019-03-16 (×2): qty 4

## 2019-03-16 MED ORDER — SIMVASTATIN 20 MG PO TABS
20.0000 mg | ORAL_TABLET | Freq: Every day | ORAL | Status: DC
  Administered 2019-03-16 – 2019-03-17 (×2): 20 mg via ORAL
  Filled 2019-03-16 (×2): qty 1

## 2019-03-16 MED ORDER — INSULIN ASPART 100 UNIT/ML ~~LOC~~ SOLN
0.0000 [IU] | SUBCUTANEOUS | Status: DC
  Administered 2019-03-16: 21:00:00 5 [IU] via SUBCUTANEOUS
  Administered 2019-03-17: 04:00:00 3 [IU] via SUBCUTANEOUS
  Administered 2019-03-17: 01:00:00 5 [IU] via SUBCUTANEOUS
  Administered 2019-03-17: 08:00:00 2 [IU] via SUBCUTANEOUS

## 2019-03-16 MED ORDER — POTASSIUM CHLORIDE CRYS ER 20 MEQ PO TBCR
40.0000 meq | EXTENDED_RELEASE_TABLET | Freq: Two times a day (BID) | ORAL | Status: AC
  Administered 2019-03-16 (×2): 40 meq via ORAL
  Filled 2019-03-16 (×2): qty 2

## 2019-03-16 MED ORDER — MAGNESIUM SULFATE 2 GM/50ML IV SOLN
2.0000 g | Freq: Once | INTRAVENOUS | Status: AC
  Administered 2019-03-16: 09:00:00 2 g via INTRAVENOUS
  Filled 2019-03-16: qty 50

## 2019-03-16 NOTE — Progress Notes (Signed)
PROGRESS NOTE    Cassandra Allen  JQB:[id number] DOB: 07-05-1946 DOA: 03/10/2019 PCP: [name], MD   Brief Narrative:  73 y.o. WF PMHx HTN, HLD, [name], diabetes type 2 controlled with complication, chronic diastolic CHF, atrial fibrillation on Eliquis, metastatic colon cancer (s/p of partial colectomy, chemotherapy), s/p of colostomy, CKD stage III, depression,   Presents with abdominal pain. Patient was recently hospitalized from 3/20-3/29 due to post operation abdominal abscess. She had perc drain placement 01/31/19. Pt was also discharged to nursing home on Augmentin for 7 days.    Patient states that she developed worsening abdominal pain today, which is diffuse, worse on upper abdomen, constant, 10 out of 10 in severity, sharp, radiating to the back.  It is associated with a fever and chills.  She also has nausea vomiting several times.  No diarrhea. Patient denies chest pain, cough, shortness of breath.  No symptoms of UTI or unilateral weakness.     ED Course: pt was found to have WBC 12.7, lactic acid 2.5, troponin 0.08, BNP 359.3, procalcitonin 4.38, potassium 3.2, renal function close to baseline, temperature 100.7, oxygen saturation 90 to 99% on room air, respiration rate 30s. Previous CT 03/05/19 showed partial decrease in size to 19.4cm.  Repeat CT shows interval removal of LLQ drain with slight increase in size of complex collection now 20.2 cm.  There is also a persistent complex gas/fluid/fat attenutation LUQ 13 cm collection which persists persists on all scans. Pt is admitted to SDU as inpt. IR, Dr. Jarvis Newcomer was consulted by EDP.   Pt has A fib with RVR with HR upto 170s in ED. Cardizem gtt was started in ED, HR improved to 130s, but Blood pressure dropped to SBP 84. Pt received 2L of Ringer solution in  ED. cardizem gtt was d/c'ed. And another 500 cc of NS will be given. PCCM was consulted. Dr. Oletta Darter recommended to start phenylephrine IV.    Subjective: 5/3 A/O x4,  negative CP, negative S OB, negative abdominal pain.  Sitting in chair comfortably wanting to go back on a regular diet.    Assessment & Plan:   Principal Problem:   Abscess of abdominal cavity ([place]) Active Problems:   Essential (primary) hypertension   HLD (hyperlipidemia)   Depression, recurrent (HCC)   Anticoagulant long-term use   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   CKD (chronic kidney disease) stage 3, GFR 30-59 ml/min (HCC)   Atrial fibrillation with RVR (HCC)   Chronic kidney disease (CKD), stage II (mild)   Sepsis (HCC)   Type II diabetes mellitus with renal manifestations (HCC)   Hypokalemia   Chronic diastolic CHF (congestive heart failure) (HCC)   Abnormal LFTs   Elevated troponin   Severe sepsis (HCC)   Pleural effusion on left  Sepsis due to abscess of abdominal cavity - Continue antibiotics per surgery - S/plaparotomy with mobilization of the splenic flexure and resection of the sigmoid, descending and distal transverse colon with a Hartman's pouch at the end of the colostomy in the right upper quadrant, on prior admission - 4/29 s/p IR drainage of abscess middle abdomen and LLQ abdomen with replacement of drains. - 5/3 continued minimal drainage from both abdominal JP drains .  Chronic diastolic CHF - Strict in and out -6.1 L -Daily weight Filed Weights   03/12/19 0446 03/13/19 0500 03/14/19 0126  Weight: 79.8 kg 81.7 kg 80.9 kg  -  2D echo on 12/23/2018 showed EF of 60-65%.   -Bilateral pleural  effusion Lt>> Rt, and bilateral pedal edema improving with diuresis - See A. fib  Atrial fibrillation with RVR ([place]): - 4/30 hold Eliquis overnight and restart in a.m. if patient remains stable.  Continue heparin - Currently A. fib - 5/1 start Cardizem CD 240 mg daily -5/2 metoprolol 12.5 mg BID -Metoprolol PRN,  - 5/2 chest CT showed bilateral pleural effusion LEFT significantly larger than right. -Lasix 40 mg BID   Essential (primary)  HTN -Resolved   HLD (hyperlipidemia): -Liver enzymes WNL -5/3 start Zocor 20 mg daily - 4/28 LDL within AHA/ADA guidelines   Depression, recurrent ([place]): -Zoloft 125 mg daily   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:  -f/u with Dr.Yan Burr Medico -4/30 Dr.Yan Feng oncology: Is repeating iron study to see if patient requires IV iron.  No role for chemotherapy at this point will repeat PET scan once patient recovers.      CKD (chronic kidney disease) stage 3, GFR 30-59 ml/min [facility]): Recent Labs  Lab 03/12/19 0437 03/13/19 0550 03/14/19 0350 03/15/19 0500 03/16/19 0528  CREATININE 0.71 0.71 0.74 0.81 0.67  - Creatinine WNL  LEFT pleural effusion -Multifactorial CHF, A. fib RVR, malnutrition, mets? -See A. fib - Discussed with patient that if she had loculated effusion or unable to reduce effusion with diuresis would need bedside thoracentesis. - PCXR 5/4 pending  Diabetes type 2 controlled with complication - 1/19 hemoglobin A1c= 6.8 -5/1 Lantus 10 units daily -5/2 NovoLog 4 units QAC - 5/3 moderate SSI -Patient's diet was advanced to carb modified.  Again warned patient to take it very slowly given her recent surgery.    Hypokalemia -Potassium goal> 4 - Potassium 40 mEq x 2 doses  Hypomagnesmia -Magnesium goal> 2 -Magnesium 2 g   Abnormal LFTs  -Resolved   Elevated troponin:  -Troponin 0.08, no chest pain.  Likely due to demand ischemia secondary to sepsis and atrial fibrillation with RVR. - cycle CE q6 x3 and repeat EKG in the am  Recent Labs  Lab 03/10/19 2058 03/11/19 0238 03/11/19 0557  TROPONINI 0.08* 0.06* 0.05*       DVT prophylaxis: Heparin Code Status: Full Family Communication: None Disposition Plan: TBD    Consultants:  IR  [facility] Oncology    Procedures/Significant Events:  12/27/2018 Dr. [name] S/plaparotomy with mobilization of the splenic flexure and resection of the sigmoid, descending and distal transverse colon with  a Hartman's pouch at the end of the colostomy in the right upper quadrant, 2/14 surgical pathology adenocarcinoma of proximal descending colon, sigmoid mass consistent with large villous adenoma 3/20 abdominal CT from SNF: Large rim-enhancing fluid connection with multiple foci over the lower abdomen with a likely intra-abdominal abscess She was readmitted and under IR drain 01/31/19, Dr. Earleen Newport.  She was transitioned to oral abx and discharged again on 02/09/19.  CT 01/31/19 : 12.6 x 24.6 x 18.4 cm enhancing fluid collection lower abdomen extending to the mid left upper abdomen, likely intra-abdominal abscess IR drain placement 01/31/2019 CT 03/05/2019: Abscess cavity measured 18.1 x 15 x 3.3 cm.  Previously this measured 12.6 x 24.6 x 18.4 drain was positioned within the left side of the abscess cavity abscess did not extend centrally toward midline.  There is also suspected upper abscess measuring 13.8 x 9.4 improved from prior measurement of 16.6 x 8.4 CT 03/08/2018 showed the prior drain have been removed.  There is a persistent fluid collection left lower abdomen extending in central pelvis collection measured 20.2 x 3.9 with mildly  increased size from 19.4 x 2.3 on the last scan.  No new fluid collections.  Caudate lobe of the liver shows metastases without significant interval change.  No new or progressive metastatic disease was identified.  Stable left pleural effusion and left lower lobe atelectasis. Chest x-ray showed: 1. Persistent small to moderate left pleural effusion with associated left basilar opacity. 2. Cardiomegaly with underlying mild diffuse pulmonary interstitial edema. 3. Aortic atherosclerosis. 4/29 CT guided drainage intra-abdominal abscess collection:-150 ml green purulent looking fluid removed from the complex mid upper collection after 12 Fr drain placement -30 ml of yellow purulent fluid removed from left lower abscess collection after placement of 12 Fr drain.   4/30 PCXR-  slightly increased moderate LEFT pleural effusion 5/1 CT chest W. Wo contrast:-Left larger than right pleural effusions.  -Left greater than right base collapse/consolidation. Favor atelectasis at the right lower lobe. The left lower lobe process could represent atelectasis or infection. - Pulmonary artery enlargement suggests pulmonary arterial hypertension. -Small volume upper abdominal ascites, increased from 03/10/2019. -Similar left adrenal lesion, determined to be an adenoma by prior MRI.     I have personally reviewed and interpreted all radiology studies and my findings are as above.  VENTILATOR SETTINGS:    Cultures 4/28 MRSA by PCR negative 4/28 abscess drainage LLQ incubated for better growth 4/28 abscess drainage middle upper abdomen reintubated for better growth 5/1SARS-COV-2 negative    Antimicrobials: Anti-infectives (From admission, onward)   Start     Stop   03/11/19 1000  ceFEPIme (MAXIPIME) 2 g in sodium chloride 0.9 % 100 mL IVPB         03/11/19 0900  metroNIDAZOLE (FLAGYL) IVPB 500 mg         03/10/19 2100  ceFEPIme (MAXIPIME) 2 g in sodium chloride 0.9 % 100 mL IVPB     03/10/19 2219   03/10/19 2100  metroNIDAZOLE (FLAGYL) IVPB 500 mg     03/11/19 0114       Devices    LINES / TUBES:  12 French drain mid upper abdomen 4/29>> 12 French drain left lower abdomen 4/29>>     Continuous Infusions:  sodium chloride Stopped (03/14/19 0900)   sodium chloride 75 mL/hr at 03/13/19 0500   ceFEPime (MAXIPIME) IV 2 g (03/15/19 2159)   heparin 1,600 Units/hr (03/15/19 1943)   metronidazole 500 mg (03/16/19 0037)     Objective: Vitals:   03/15/19 1900 03/15/19 2150 03/15/19 2311 03/16/19 0319  BP: 129/81 (!) 149/97 129/73 (!) 142/79  Pulse: 90 88 87 85  Resp: (!) 21  14 18   Temp: 98.2 F (36.8 C)  98.3 F (36.8 C) 97.7 F (36.5 C)  TempSrc: Oral  Oral Oral  SpO2: 98%  92% 100%  Weight:    77 kg  Height:        Intake/Output Summary  (Last 24 hours) at 03/16/2019 0800 Last data filed at 03/16/2019 0700 Gross per 24 hour  Intake 1170.82 ml  Output 6048 ml  Net -4877.18 ml   Filed Weights   03/13/19 0500 03/14/19 0126 03/16/19 0319  Weight: 81.7 kg 80.9 kg 77 kg   General: A/O x4, no acute respiratory distress Eyes: negative scleral hemorrhage, negative anisocoria, negative icterus ENT: Negative Runny nose, negative gingival bleeding, Neck:  Negative scars, masses, torticollis, lymphadenopathy, JVD Lungs: Clear to auscultation right lung fields, absent breath sounds left lower lung field, without wheezes or crackles  Cardiovascular: Regular rate and rhythm without murmur gallop or rub normal  S1 and S2 Abdomen: Morbidly obese, positive abdominal pain, nondistended, positive soft, bowel sounds, no rebound, no ascites, no appreciable mass, JP drain x2 in place RLQ/LLQ draining straw-colored fluid Extremities: No significant cyanosis, clubbing.  Positive +1 edema, bilateral lower extremity  Skin: Negative rashes, lesions, ulcers Psychiatric:  Negative depression, negative anxiety, negative fatigue, negative mania  Central nervous system:  Cranial nerves II through XII intact, tongue/uvula midline, all extremities muscle strength 5/5, sensation intact throughout, negative dysarthria, negative expressive aphasia, negative receptive aphasia.     Data Reviewed: Care during the described time interval was provided by me .  I have reviewed this patient's available data, including medical history, events of note, physical examination, and all test results as part of my evaluation.   CBC: Recent Labs  Lab 03/10/19 2058  03/12/19 0437 03/13/19 0550 03/14/19 0350 03/15/19 0500 03/16/19 0528  WBC 12.7*   < > 10.6* 10.4 11.4* 8.4 7.9  NEUTROABS 11.8*  --   --   --   --   --   --   HGB 11.1*   < > 9.4* 9.9* 9.7* 8.7* 9.6*  HCT 38.6   < > 33.2* 35.9* 35.4* 31.3* 34.1*  MCV 91.9   < > 95.1 96.0 95.9 94.8 93.4  PLT 359   < > 278  326 350 303 303   < > = values in this interval not displayed.   Basic Metabolic Panel: Recent Labs  Lab 03/11/19 0557 03/12/19 0437 03/13/19 0550 03/14/19 0350 03/15/19 0500 03/16/19 0528  NA 141 140 139 139 142 143  K 3.7 3.9 3.8 3.5 2.8* 2.9*  CL 104 104 107 108 104 102  CO2 27 27 25 24 29  34*  GLUCOSE 114* 136* 160* 241* 241* 113*  BUN 20 19 17 12 10 11   CREATININE 0.83 0.71 0.71 0.74 0.81 0.67  CALCIUM 8.0* 8.1* 7.9* 8.0* 8.2* 8.4*  MG  --  2.2 1.8 1.7 1.6* 1.6*  PHOS 4.0 3.7 3.5  --   --   --    GFR: Estimated Creatinine Clearance: 61.1 mL/min (by C-G formula based on SCr of 0.67 mg/dL). Liver Function Tests: Recent Labs  Lab 03/10/19 2058 03/14/19 0350 03/16/19 0528  AST 237* 12*  --   ALT 95* 42  --   ALKPHOS 1,377* 528*  --   BILITOT 2.3* 1.0  --   PROT 6.0* 5.3*  --   ALBUMIN 2.5* 2.1* 2.8*   Recent Labs  Lab 03/10/19 2058  LIPASE 38   No results for input(s): AMMONIA in the last 168 hours. Coagulation Profile: Recent Labs  Lab 03/11/19 0238  INR 2.1*   Cardiac Enzymes: Recent Labs  Lab 03/10/19 2058 03/11/19 0238 03/11/19 0557  TROPONINI 0.08* 0.06* 0.05*   BNP (last 3 results) No results for input(s): PROBNP in the last 8760 hours. HbA1C: No results for input(s): HGBA1C in the last 72 hours. CBG: Recent Labs  Lab 03/15/19 0616 03/15/19 1115 03/15/19 1608 03/15/19 2138 03/16/19 0613  GLUCAP 197* 185* 192* 112* 94   Lipid Profile: No results for input(s): CHOL, HDL, LDLCALC, TRIG, CHOLHDL, LDLDIRECT in the last 72 hours. Thyroid Function Tests: No results for input(s): TSH, T4TOTAL, FREET4, T3FREE, THYROIDAB in the last 72 hours. Anemia Panel: Recent Labs    03/14/19 0350  FERRITIN 446*  TIBC 167*  IRON 100   Urine analysis:    Component Value Date/Time   COLORURINE AMBER (A) 03/10/2019 0041   APPEARANCEUR CLEAR 03/10/2019 0041  LABSPEC 1.032 (H) 03/10/2019 0041   PHURINE 5.0 03/10/2019 0041   GLUCOSEU NEGATIVE  03/10/2019 0041   HGBUR SMALL (A) 03/10/2019 0041   BILIRUBINUR NEGATIVE 03/10/2019 0041   KETONESUR NEGATIVE 03/10/2019 0041   PROTEINUR 30 (A) 03/10/2019 0041   UROBILINOGEN 0.2 03/25/2015 2317   NITRITE POSITIVE (A) 03/10/2019 0041   LEUKOCYTESUR MODERATE (A) 03/10/2019 0041   Sepsis Labs: @LABRCNTIP (procalcitonin:4,lacticidven:4)  ) Recent Results (from the past 240 hour(s))  Urine culture     Status: None   Collection Time: 03/10/19  8:41 PM  Result Value Ref Range Status   Specimen Description URINE, CATHETERIZED  Final   Special Requests NONE  Final   Culture   Final    NO GROWTH Performed at [facility], [place] [address] [place], [place] [address]    Report Status 03/13/2019 FINAL  Final  Blood Culture (routine x 2)     Status: Abnormal   Collection Time: 03/10/19  9:37 PM  Result Value Ref Range Status   Specimen Description BLOOD RIGHT HAND  Final   Special Requests   Final    BOTTLES DRAWN AEROBIC AND ANAEROBIC Blood Culture results may not be optimal due to an inadequate volume of blood received in culture bottles   Culture  Setup Time   Final    GRAM NEGATIVE RODS IN BOTH AEROBIC AND ANAEROBIC BOTTLES CRITICAL RESULT CALLED TO, READ BACK BY AND VERIFIED WITH: Cristopher Estimable PharmD 11:10 03/11/19 ([email or web address]) Performed at [facility], [place] [address], [place], [place] [address]    Culture ESCHERICHIA COLI (A)  Final   Report Status 03/13/2019 FINAL  Final   Organism ID, Bacteria ESCHERICHIA COLI  Final      Susceptibility   Escherichia coli - MIC*    AMPICILLIN >=32 RESISTANT Resistant     CEFAZOLIN <=4 SENSITIVE Sensitive     CEFEPIME <=1 SENSITIVE Sensitive     CEFTAZIDIME <=1 SENSITIVE Sensitive     CEFTRIAXONE <=1 SENSITIVE Sensitive     CIPROFLOXACIN <=0.25 SENSITIVE Sensitive     GENTAMICIN <=1 SENSITIVE Sensitive     IMIPENEM <=0.25 SENSITIVE Sensitive     TRIMETH/SULFA <=20 SENSITIVE Sensitive     AMPICILLIN/SULBACTAM >=32 RESISTANT  Resistant     PIP/TAZO <=4 SENSITIVE Sensitive     Extended ESBL NEGATIVE Sensitive     * ESCHERICHIA COLI  Blood Culture ID Panel (Reflexed)     Status: Abnormal   Collection Time: 03/10/19  9:37 PM  Result Value Ref Range Status   Enterococcus species NOT DETECTED NOT DETECTED Final   Listeria monocytogenes NOT DETECTED NOT DETECTED Final   Staphylococcus species NOT DETECTED NOT DETECTED Final   Staphylococcus aureus (BCID) NOT DETECTED NOT DETECTED Final   Streptococcus species NOT DETECTED NOT DETECTED Final   Streptococcus agalactiae NOT DETECTED NOT DETECTED Final   Streptococcus pneumoniae NOT DETECTED NOT DETECTED Final   Streptococcus pyogenes NOT DETECTED NOT DETECTED Final   Acinetobacter baumannii NOT DETECTED NOT DETECTED Final   Enterobacteriaceae species DETECTED (A) NOT DETECTED Final    Comment: Enterobacteriaceae represent a large family of gram-negative bacteria, not a single organism. CRITICAL RESULT CALLED TO, READ BACK BY AND VERIFIED WITH: Cristopher Estimable PharmD 11:10 03/11/19 ([email or web address])    Enterobacter cloacae complex NOT DETECTED NOT DETECTED Final   Escherichia coli DETECTED (A) NOT DETECTED Final    Comment: CRITICAL RESULT CALLED TO, READ BACK BY AND VERIFIED WITH: Cristopher Estimable PharmD 11:10 03/11/19 ([email or web address])  Klebsiella oxytoca NOT DETECTED NOT DETECTED Final   Klebsiella pneumoniae NOT DETECTED NOT DETECTED Final   Proteus species NOT DETECTED NOT DETECTED Final   Serratia marcescens NOT DETECTED NOT DETECTED Final   Carbapenem resistance NOT DETECTED NOT DETECTED Final   Haemophilus influenzae NOT DETECTED NOT DETECTED Final   Neisseria meningitidis NOT DETECTED NOT DETECTED Final   Pseudomonas aeruginosa NOT DETECTED NOT DETECTED Final   Candida albicans NOT DETECTED NOT DETECTED Final   Candida glabrata NOT DETECTED NOT DETECTED Final   Candida krusei NOT DETECTED NOT DETECTED Final   Candida parapsilosis NOT DETECTED NOT DETECTED Final   Candida  tropicalis NOT DETECTED NOT DETECTED Final    Comment: Performed at [facility], [place] [address], [place], [place] [address]  Blood Culture (routine x 2)     Status: Abnormal   Collection Time: 03/10/19  9:50 PM  Result Value Ref Range Status   Specimen Description BLOOD LEFT ARM  Final   Special Requests   Final    BOTTLES DRAWN AEROBIC AND ANAEROBIC Blood Culture adequate volume   Culture  Setup Time   Final    GRAM NEGATIVE RODS IN BOTH AEROBIC AND ANAEROBIC BOTTLES CRITICAL VALUE NOTED.  VALUE IS CONSISTENT WITH PREVIOUSLY REPORTED AND CALLED VALUE.    Culture (A)  Final    ESCHERICHIA COLI SUSCEPTIBILITIES PERFORMED ON PREVIOUS CULTURE WITHIN THE LAST 5 DAYS. Performed at [facility], [place] [address] [place], [place] [address]    Report Status 03/13/2019 FINAL  Final  MRSA PCR Screening     Status: None   Collection Time: 03/11/19  3:47 AM  Result Value Ref Range Status   MRSA by PCR NEGATIVE NEGATIVE Final    Comment:        The GeneXpert MRSA Assay (FDA approved for NASAL specimens only), is one component of a comprehensive MRSA colonization surveillance program. It is not intended to diagnose MRSA infection nor to guide or monitor treatment for MRSA infections. Performed at [facility], [place] [address] [place], [place] [address]   Aerobic/Anaerobic Culture (surgical/deep wound)     Status: None (Preliminary result)   Collection Time: 03/11/19  3:00 PM  Result Value Ref Range Status   Specimen Description ABSCESS DRAINAGE  Final   Special Requests MID UPPER ABDOMEN  Final   Gram Stain   Final    ABUNDANT WBC PRESENT,BOTH PMN AND MONONUCLEAR MODERATE GRAM VARIABLE ROD FEW GRAM NEGATIVE RODS Performed at [facility], [place] [address], [place], [place] [address]    Culture   Final    MODERATE PROTEUS MIRABILIS FEW ENTEROCOCCUS FAECALIS MIXED ANAEROBIC FLORA PRESENT.  CALL LAB IF FURTHER IID REQUIRED.    Report Status PENDING  Incomplete     Organism ID, Bacteria PROTEUS MIRABILIS  Final   Organism ID, Bacteria ENTEROCOCCUS FAECALIS  Final      Susceptibility   Enterococcus faecalis - MIC*    AMPICILLIN <=2 SENSITIVE Sensitive     VANCOMYCIN 1 SENSITIVE Sensitive     GENTAMICIN SYNERGY SENSITIVE Sensitive     * FEW ENTEROCOCCUS FAECALIS   Proteus mirabilis - MIC*    AMPICILLIN <=2 SENSITIVE Sensitive     CEFAZOLIN <=4 SENSITIVE Sensitive     CEFEPIME <=1 SENSITIVE Sensitive     CEFTAZIDIME <=1 SENSITIVE Sensitive     CEFTRIAXONE <=1 SENSITIVE Sensitive     CIPROFLOXACIN <=0.25 SENSITIVE Sensitive     GENTAMICIN <=1 SENSITIVE Sensitive  IMIPENEM 2 SENSITIVE Sensitive     TRIMETH/SULFA <=20 SENSITIVE Sensitive     AMPICILLIN/SULBACTAM <=2 SENSITIVE Sensitive     PIP/TAZO <=4 SENSITIVE Sensitive     * MODERATE PROTEUS MIRABILIS  Aerobic/Anaerobic Culture (surgical/deep wound)     Status: None (Preliminary result)   Collection Time: 03/11/19  3:00 PM  Result Value Ref Range Status   Specimen Description ABSCESS DRAINAGE  Final   Special Requests LEFT LOWER ABDOMEN  Final   Gram Stain   Final    ABUNDANT WBC PRESENT,BOTH PMN AND MONONUCLEAR RARE GRAM POSITIVE COCCI FEW GRAM NEGATIVE RODS FEW GRAM VARIABLE ROD Performed at [facility], [place] [address], [place], [place] [address]    Culture   Final    MODERATE PROTEUS MIRABILIS MIXED ANAEROBIC FLORA PRESENT.  CALL LAB IF FURTHER IID REQUIRED.    Report Status PENDING  Incomplete   Organism ID, Bacteria PROTEUS MIRABILIS  Final      Susceptibility   Proteus mirabilis - MIC*    AMPICILLIN <=2 SENSITIVE Sensitive     CEFAZOLIN <=4 SENSITIVE Sensitive     CEFEPIME <=1 SENSITIVE Sensitive     CEFTAZIDIME <=1 SENSITIVE Sensitive     CEFTRIAXONE <=1 SENSITIVE Sensitive     CIPROFLOXACIN <=0.25 SENSITIVE Sensitive     GENTAMICIN <=1 SENSITIVE Sensitive     IMIPENEM 2 SENSITIVE Sensitive     TRIMETH/SULFA <=20 SENSITIVE Sensitive     AMPICILLIN/SULBACTAM <=2  SENSITIVE Sensitive     PIP/TAZO <=4 SENSITIVE Sensitive     * MODERATE PROTEUS MIRABILIS  Novel Coronavirus, NAA (hospital order; send-out to ref lab)     Status: None   Collection Time: 03/14/19  3:35 PM  Result Value Ref Range Status   SARS-CoV-2, NAA NOT DETECTED NOT DETECTED Final    Comment: (NOTE) This test was developed and its performance characteristics determined by [employer]. This test has not been FDA cleared or approved. This test has been authorized by FDA under an Emergency Use Authorization (EUA). This test is only authorized for the duration of time the declaration that circumstances exist justifying the authorization of the emergency use of in vitro diagnostic tests for detection of SARS-CoV-2 virus and/or diagnosis of COVID-19 infection under section 564(b)(1) of the Act, 21 U.S.C. 062BJS-2(G)(3), unless the authorization is terminated or revoked sooner. When diagnostic testing is negative, the possibility of a false negative result should be considered in the context of a patient's recent exposures and the presence of clinical signs and symptoms consistent with COVID-19. An individual without symptoms of COVID-19 and who is not shedding SARS-CoV-2 virus would expect to have a negative (not detected) result in this assay. Performed  At: [facility] [address] [place], [place] [id number] Rush Farmer MD PX:[id number]    [place]  Final    Comment: Performed at [facility], [place] [address], [place], [place] [address]         Radiology Studies: Ct Chest Wo Contrast  Result Date: 03/14/2019 CLINICAL DATA:  Chest pain. Pleural effusion. Pulmonary origin of pain suspected. EXAM: CT CHEST WITHOUT CONTRAST TECHNIQUE: Multidetector CT imaging of the chest was performed following the standard protocol without IV contrast. COMPARISON:  Chest radiographs, most recent 1 day prior. Chest CT 11/01/2018. FINDINGS:  Cardiovascular: Aortic and branch vessel atherosclerosis. Moderate cardiomegaly, without pericardial effusion. Multivessel coronary artery atherosclerosis. Pulmonary artery enlargement, outflow tract 3.3 cm. Right Port-A-Cath tip at high right atrium. Mediastinum/Nodes: Hypoattenuating left-sided thyroid  nodule on the order of 2.1 cm, grossly similar. No supraclavicular adenopathy. 11 mm right paratracheal node is minimally decreased from 12 mm on the prior. Hilar regions poorly evaluated without intravenous contrast. Lungs/Pleura: Moderate left and small right pleural effusions. Mild motion degradation throughout. Left greater than right base collapse/consolidation. Upper Abdomen: Previously described liver lesions are not well evaluated on this noncontrast exam. Normal imaged portions of the spleen, pancreas, stomach. Percutaneous catheter within an upper abdominal gas and fluid collection, incompletely imaged. Perihepatic small volume ascites is new since 11/01/2018. Incompletely imaged left adrenal nodule measures 3.1 x 3.0 cm today versus 2.9 x 3.1 cm on the prior. Musculoskeletal: Moderate thoracic spondylosis. IMPRESSION: 1. Left larger than right pleural effusions. Left greater than right base collapse/consolidation. Favor atelectasis at the right lower lobe. The left lower lobe process could represent atelectasis or infection. 2. Coronary artery atherosclerosis. Aortic Atherosclerosis (ICD10-I70.0). 3. Pulmonary artery enlargement suggests pulmonary arterial hypertension. 4. Small volume upper abdominal ascites, increased from 03/10/2019. 5. 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 Electronically Signed   By: Abigail Miyamoto M.D.   On: 03/14/2019 12:29        Scheduled Meds:  atorvastatin  10 mg Oral QHS   diltiazem  240 mg Oral Daily   insulin aspart  0-9 Units Subcutaneous TID WC   insulin aspart  4 Units Subcutaneous TID WC   insulin glargine  10 Units  Subcutaneous Daily   metoprolol tartrate  12.5 mg Oral BID   pantoprazole (PROTONIX) IV  40 mg Intravenous QHS   sertraline  125 mg Oral Daily   sodium chloride flush  5 mL Intracatheter Q8H   Continuous Infusions:  sodium chloride Stopped (03/14/19 0900)   sodium chloride 75 mL/hr at 03/13/19 0500   ceFEPime (MAXIPIME) IV 2 g (03/15/19 2159)   heparin 1,600 Units/hr (03/15/19 1943)   metronidazole 500 mg (03/16/19 0037)     LOS: 5 days   The patient is critically ill with multiple organ systems failure and requires high complexity decision making for assessment and support, frequent evaluation and titration of therapies, application of advanced monitoring technologies and extensive interpretation of multiple databases. Critical Care Time devoted to patient care services described in this note  Time spent: 40 minutes     Ozetta Flatley, Geraldo Docker, MD [facility] Pager [phone]  If 7PM-7AM, please contact night-coverage [email or web address] Password [email or web address] 03/16/2019, 8:00 AM

## 2019-03-16 NOTE — Progress Notes (Signed)
   Patient with history of metastatic colon cancer (s/p partial colectomy and end colostomy) with secondary postoperative abscess s/p mid upper abdomen drain (labeled 1) and LLQ drain (laveled 2) placement 03/11/2019 by Dr. Anselm Pancoast.  Feeling some better, tolerating diet advanced Drains intact, sites clean. Output still purulent/cloudy  Continue current drain management- continue with Qshift flushes/monitor of output. Plan for CT abdomen to evaluate for possible drain pull(s) when output <10 cc/day. Appreciate and agree with CCS and TRH management. IR to follow.  Ascencion Dike PA-C Interventional Radiology 03/16/2019 10:03 AM

## 2019-03-16 NOTE — Progress Notes (Signed)
Pharmacy Antibiotic Note  Cassandra Allen is a 73 y.o. female admitted on 03/10/2019 with intra-abdominal infection.  Pharmacy has been consulted for cefepime dosing. Also has Flagyl ordered per MD. E. Coli noted to be growing within bloodstream. Perc drain placed by IR. Abscess drainage cultures growing pan-sensitive proteus.  Plan: -Continue cefepime 2g IV q12h -Continue Flagyl 500mg  IV q8h -Consider narrowing to ceftriaxone   Height: 5\' 2"  (157.5 cm) Weight: 169 lb 12.1 oz (77 kg) IBW/kg (Calculated) : 50.1  Temp (24hrs), Avg:98.1 F (36.7 C), Min:97.7 F (36.5 C), Max:98.5 F (36.9 C)  Recent Labs  Lab 03/10/19 2058 03/11/19 0049  03/12/19 0437 03/13/19 0550 03/14/19 0350 03/15/19 0500 03/16/19 0528  WBC 12.7*  --    < > 10.6* 10.4 11.4* 8.4 7.9  CREATININE 1.09*  --    < > 0.71 0.71 0.74 0.81 0.67  LATICACIDVEN 2.5* 3.3*  --   --   --   --   --   --    < > = values in this interval not displayed.    Estimated Creatinine Clearance: 61.1 mL/min (by C-G formula based on SCr of 0.67 mg/dL).    Allergies  Allergen Reactions  . Metformin And Related Nausea And Vomiting  . Prednisone Nausea And Vomiting   Antimicrobials: Cefepime 4/27>> Flagyl 4/27>>  Microbiology: 4/27 BCx: E coli (R-amp/unasyn) 4/28 MRSA: neg 4/28 Surgical/deep wound: moderate proteus (panS)   Arrie Senate, PharmD, BCPS Clinical Pharmacist [phone] Please check AMION for all [place] numbers 03/16/2019

## 2019-03-16 NOTE — Evaluation (Signed)
Occupational Therapy Evaluation Patient Details Name: Cassandra Allen MRN: [id number] DOB: 01/01/1946 Today's Date: 03/16/2019    History of Present Illness 73 yo female recently dx with colon cancer with mets.  Had surgery.  Post op complicated by abscess.  Had drain by IR.  Grew Enterococcus.  D/c home on ABx and had drain removed 4/22.  She was noted to have enlarging abscess.  Developed abdominal pain, nausea, vomiting.  In ER had A fib with RVR.  Started on cardizem.  Became hypotensive.    Clinical Impression   Patient admitted for above and limited by problem list below, including impaired balance, generalized weakness, decreased activity tolerance.  Patient reports prior to initial admission she was independent with ADLs/mobility, returned to hospital from SNF where she was beginning to walk with RW and required min assist for ADLs.  She currently requires mod assist for basic transfers, max-total assist for LB ADLs, and min-setup assist for UB ADls seated.  She will benefit from continued OT services while admitted and after dc at SNF level in order to maximize independence and safety with ADLs/mobility.      Follow Up Recommendations  SNF;Supervision/Assistance - 24 hour    Equipment Recommendations  3 in 1 bedside commode    Recommendations for Other Services       Precautions / Restrictions Precautions Precautions: Fall Restrictions Weight Bearing Restrictions: No      Mobility Bed Mobility               General bed mobility comments: OOB in chair upon entry   Transfers Overall transfer level: Needs assistance Equipment used: Rolling walker (2 wheeled) Transfers: Sit to/from Stand Sit to Stand: Mod assist         General transfer comment: cueing for hand placement and safety, mod assist to power up, for safety and balance     Balance Overall balance assessment: Needs assistance Sitting-balance support: Feet unsupported;No upper extremity  supported Sitting balance-Leahy Scale: Fair     Standing balance support: Bilateral upper extremity supported Standing balance-Leahy Scale: Poor Standing balance comment: reliant on BUE support in standing                            ADL either performed or assessed with clinical judgement   ADL Overall ADL's : Needs assistance/impaired     Grooming: Set up;Sitting   Upper Body Bathing: Set up;Sitting   Lower Body Bathing: Moderate assistance;Sit to/from stand Lower Body Bathing Details (indicate cue type and reason): decreased reach to B LEs, mod assist sit<>stand  Upper Body Dressing : Minimal assistance;Sitting   Lower Body Dressing: Maximal assistance;Sit to/from stand Lower Body Dressing Details (indicate cue type and reason): decreased reach to B LEs, mod assist sit<>stand  Toilet Transfer: Moderate assistance;Ambulation;RW Toilet Transfer Details (indicate cue type and reason): simulated to/from recliner  Toileting- Clothing Manipulation and Hygiene: Sit to/from stand;Maximal assistance Toileting - Clothing Manipulation Details (indicate cue type and reason): assist to manage mesh underwear in standing      Functional mobility during ADLs: Moderate assistance;Rolling walker General ADL Comments: pt limited by generalized weakness, impaired balance and decreased activity tolerance      Vision   Vision Assessment?: No apparent visual deficits     Perception     Praxis      Pertinent Vitals/Pain Pain Assessment: Faces Faces Pain Scale: No hurt     Hand Dominance Right   Extremity/Trunk Assessment  Upper Extremity Assessment Upper Extremity Assessment: Generalized weakness   Lower Extremity Assessment Lower Extremity Assessment: Defer to PT evaluation       Communication Communication Communication: No difficulties   Cognition Arousal/Alertness: Awake/alert Behavior During Therapy: WFL for tasks assessed/performed Overall Cognitive Status:  Within Functional Limits for tasks assessed                                 General Comments: appears [facility] for basic tasks    General Comments  VSS- pt on RA during session with oxygen saturations maintained (dropped at times, but poor waveform with pt asymptomatic)    Exercises     Shoulder Instructions      Home Living Family/patient expects to be discharged to:: Skilled nursing facility                                        Prior Functioning/Environment Level of Independence: Needs assistance  Gait / Transfers Assistance Needed: reports using RW for mobility (at SNF prior to admission)  ADL's / Homemaking Assistance Needed: reports required some assist with ADLs at SNF, but prior to inital admission independent             OT Problem List: Decreased strength;Decreased activity tolerance;Impaired balance (sitting and/or standing);Decreased knowledge of use of DME or AE;Decreased knowledge of precautions      OT Treatment/Interventions: Energy conservation;DME and/or AE instruction;Therapeutic activities;Patient/family education;Balance training;Therapeutic exercise;Self-care/ADL training    OT Goals(Current goals can be found in the care plan section) Acute Rehab OT Goals Patient Stated Goal: go back to SNF OT Goal Formulation: With patient Time For Goal Achievement: 03/30/19 Potential to Achieve Goals: Good  OT Frequency: Min 2X/week   Barriers to D/C:            Co-evaluation              AM-PAC OT "6 Clicks" Daily Activity     Outcome Measure Help from another person eating meals?: A Little Help from another person taking care of personal grooming?: A Little Help from another person toileting, which includes using toliet, bedpan, or urinal?: A Lot Help from another person bathing (including washing, rinsing, drying)?: A Lot Help from another person to put on and taking off regular upper body clothing?: Total Help from  another person to put on and taking off regular lower body clothing?: A Lot 6 Click Score: 13   End of Session Equipment Utilized During Treatment: Gait belt;Rolling walker Nurse Communication: Mobility status  Activity Tolerance: Patient tolerated treatment well Patient left: in chair;with call bell/phone within reach;with nursing/sitter in room  OT Visit Diagnosis: Other abnormalities of gait and mobility (R26.89);Muscle weakness (generalized) (M62.81)                Time: 1330-1400 OT Time Calculation (min): 30 min Charges:  OT General Charges $OT Visit: 1 Visit OT Evaluation $OT Eval Moderate Complexity: 1 Mod OT Treatments $Self Care/Home Management : 8-22 mins  Delight Stare, OT Acute Rehabilitation Services Pager [phone] Office [phone]   Delight Stare 03/16/2019, 3:46 PM

## 2019-03-16 NOTE — Progress Notes (Signed)
[place] for Apxiaban to Heparin Indication: atrial fibrillation  Allergies  Allergen Reactions  . Metformin And Related Nausea And Vomiting  . Prednisone Nausea And Vomiting    Patient Measurements: Height: 5\' 2"  (157.5 cm) Weight: 169 lb 12.1 oz (77 kg) IBW/kg (Calculated) : 50.1  Vital Signs: Temp: 97.7 F (36.5 C) (05/03 0319) Temp Source: Oral (05/03 0319) BP: 142/79 (05/03 0319) Pulse Rate: 85 (05/03 0319)  Labs: Recent Labs    03/14/19 0349  03/14/19 0350 03/15/19 0500 03/16/19 0528  HGB  --    < > 9.7* 8.7* 9.6*  HCT  --   --  35.4* 31.3* 34.1*  PLT  --   --  350 303 303  APTT  --   --  72*  --   --   HEPARINUNFRC 0.29*  --   --  0.24* 0.35  CREATININE  --   --  0.74 0.81 0.67   < > = values in this interval not displayed.    Estimated Creatinine Clearance: 61.1 mL/min (by C-G formula based on SCr of 0.67 mg/dL).   Medical History: Past Medical History:  Diagnosis Date  . Acute diastolic heart failure ([place]) 12/22/2018  . Arthritis   . Atrial fibrillation, chronic 12/22/2018  . Cancer of left colon ([place]) 10/30/2018  . Cancer of sigmoid colon  12/27/2018  . Diabetes mellitus without complication ([place])   . Hypertension   . Obesity (BMI 30-39.9) 12/27/2018    Assessment: 88 yoF on apixaban at home for AFib start on IV heparin with need for IR procedures. Heparin level now therapeutic, CBC stable.   Goal of Therapy:  Heparin level 0.3-0.7 units/ml Monitor platelets by anticoagulation protocol: Yes    Plan:  -Continue heparin 1600 units/hr -Transition back to apixaban once all procedures completed -Daily heparin level and CBC   Arrie Senate, PharmD, BCPS Clinical Pharmacist [phone] Please check AMION for all [place] numbers 03/16/2019

## 2019-03-17 ENCOUNTER — Inpatient Hospital Stay (HOSPITAL_COMMUNITY): Payer: Medicare HMO

## 2019-03-17 LAB — GLUCOSE, CAPILLARY
Glucose-Capillary: 132 mg/dL — ABNORMAL HIGH (ref 70–99)
Glucose-Capillary: 136 mg/dL — ABNORMAL HIGH (ref 70–99)
Glucose-Capillary: 152 mg/dL — ABNORMAL HIGH (ref 70–99)
Glucose-Capillary: 219 mg/dL — ABNORMAL HIGH (ref 70–99)
Glucose-Capillary: 310 mg/dL — ABNORMAL HIGH (ref 70–99)

## 2019-03-17 LAB — CBC
HCT: 34.3 % — ABNORMAL LOW (ref 36.0–46.0)
Hemoglobin: 9.9 g/dL — ABNORMAL LOW (ref 12.0–15.0)
MCH: 26.8 pg (ref 26.0–34.0)
MCHC: 28.9 g/dL — ABNORMAL LOW (ref 30.0–36.0)
MCV: 92.7 fL (ref 80.0–100.0)
Platelets: 295 10*3/uL (ref 150–400)
RBC: 3.7 MIL/uL — ABNORMAL LOW (ref 3.87–5.11)
RDW: 18.6 % — ABNORMAL HIGH (ref 11.5–15.5)
WBC: 11.3 10*3/uL — ABNORMAL HIGH (ref 4.0–10.5)
nRBC: 0.4 % — ABNORMAL HIGH (ref 0.0–0.2)

## 2019-03-17 LAB — MAGNESIUM: Magnesium: 1.7 mg/dL (ref 1.7–2.4)

## 2019-03-17 LAB — BASIC METABOLIC PANEL
Anion gap: 8 (ref 5–15)
BUN: 17 mg/dL (ref 8–23)
CO2: 34 mmol/L — ABNORMAL HIGH (ref 22–32)
Calcium: 8.5 mg/dL — ABNORMAL LOW (ref 8.9–10.3)
Chloride: 99 mmol/L (ref 98–111)
Creatinine, Ser: 0.95 mg/dL (ref 0.44–1.00)
GFR calc Af Amer: >60 mL/min (ref >60)
GFR calc non Af Amer: 60 mL/min — ABNORMAL LOW (ref >60)
Glucose, Bld: 159 mg/dL — ABNORMAL HIGH (ref 70–99)
Potassium: 3.3 mmol/L — ABNORMAL LOW (ref 3.5–5.1)
Sodium: 141 mmol/L (ref 135–145)

## 2019-03-17 LAB — HEPARIN LEVEL (UNFRACTIONATED): Heparin Unfractionated: 0.35 IU/mL (ref 0.30–0.70)

## 2019-03-17 MED ORDER — APIXABAN 5 MG PO TABS
5.0000 mg | ORAL_TABLET | Freq: Two times a day (BID) | ORAL | Status: DC
  Administered 2019-03-17 – 2019-03-20 (×7): 5 mg via ORAL
  Filled 2019-03-17 (×7): qty 1

## 2019-03-17 MED ORDER — POTASSIUM CHLORIDE CRYS ER 20 MEQ PO TBCR
40.0000 meq | EXTENDED_RELEASE_TABLET | ORAL | Status: AC
  Administered 2019-03-17 (×2): 40 meq via ORAL
  Filled 2019-03-17 (×2): qty 2

## 2019-03-17 MED ORDER — FUROSEMIDE 40 MG PO TABS
40.0000 mg | ORAL_TABLET | Freq: Every day | ORAL | Status: DC
  Administered 2019-03-18 – 2019-03-20 (×3): 40 mg via ORAL
  Filled 2019-03-17 (×3): qty 1

## 2019-03-17 MED ORDER — INSULIN ASPART 100 UNIT/ML ~~LOC~~ SOLN
0.0000 [IU] | Freq: Three times a day (TID) | SUBCUTANEOUS | Status: DC
  Administered 2019-03-17: 17:00:00 2 [IU] via SUBCUTANEOUS
  Administered 2019-03-17: 12:00:00 11 [IU] via SUBCUTANEOUS
  Administered 2019-03-18 – 2019-03-19 (×2): 5 [IU] via SUBCUTANEOUS

## 2019-03-17 MED ORDER — GLUCERNA SHAKE PO LIQD
237.0000 mL | Freq: Three times a day (TID) | ORAL | Status: DC
  Administered 2019-03-17 – 2019-03-19 (×4): 237 mL via ORAL

## 2019-03-17 MED ORDER — INSULIN GLARGINE 100 UNIT/ML ~~LOC~~ SOLN
12.0000 [IU] | Freq: Every day | SUBCUTANEOUS | Status: AC
  Administered 2019-03-18 – 2019-03-19 (×2): 12 [IU] via SUBCUTANEOUS
  Filled 2019-03-17 (×2): qty 0.12

## 2019-03-17 MED ORDER — INSULIN ASPART 100 UNIT/ML ~~LOC~~ SOLN
6.0000 [IU] | Freq: Three times a day (TID) | SUBCUTANEOUS | Status: DC
  Administered 2019-03-17 – 2019-03-19 (×5): 6 [IU] via SUBCUTANEOUS

## 2019-03-17 NOTE — Plan of Care (Signed)

## 2019-03-17 NOTE — Progress Notes (Signed)
Inpatient Diabetes Program Recommendations  AACE/ADA: New Consensus Statement on Inpatient Glycemic Control (2015)  Target Ranges:  Prepandial:   less than 140 mg/dL      Peak postprandial:   less than 180 mg/dL (1-2 hours)      Critically ill patients:  140 - 180 mg/dL   Lab Results  Component Value Date   GLUCAP 132 (H) 03/17/2019   HGBA1C 6.8 (H) 03/11/2019    Review of Glycemic Control Results for Cassandra Allen, Cassandra Allen (MRN [id number]) as of 03/17/2019 09:50  Ref. Range 03/16/2019 16:13 03/16/2019 20:34 03/17/2019 00:17 03/17/2019 03:50 03/17/2019 07:34  Glucose-Capillary Latest Ref Range: 70 - 99 mg/dL 210 (H) 240 (H) 219 (H) 152 (H) 132 (H)   Diabetes history: DM2 Outpatient Diabetes medications: NPH 12 units bid Current orders for Inpatient glycemic control: Lantus 10 units + Novolog 4 units tid meal coverage + Novolog moderate correction q 4 hrs.  Inpatient Diabetes Program Recommendations:   -Increase Lantus to 12 units -Change Novolog correction to tid + hs 0-5 units -Increase Novolog meal coverage to 6 units tid if eats 50%  Thank you, Bethena Roys E. Analyce Tavares, RN, MSN, CDE  Diabetes Coordinator Inpatient Glycemic Control Team Team Pager [phone] (8am-5pm) 03/17/2019 9:53 AM

## 2019-03-17 NOTE — Progress Notes (Signed)
[place] for Apxiaban to Heparin Indication: atrial fibrillation  Allergies  Allergen Reactions  . Metformin And Related Nausea And Vomiting  . Prednisone Nausea And Vomiting    Patient Measurements: Height: 5\' 2"  (157.5 cm) Weight: 162 lb 7.7 oz (73.7 kg) IBW/kg (Calculated) : 50.1  Vital Signs: Temp: 98.5 F (36.9 C) (05/04 0716) Temp Source: Oral (05/04 0716) BP: 143/93 (05/04 0716) Pulse Rate: 99 (05/04 0716)  Labs: Recent Labs    03/15/19 0500 03/16/19 0528 03/17/19 0430  HGB 8.7* 9.6* 9.9*  HCT 31.3* 34.1* 34.3*  PLT 303 303 295  HEPARINUNFRC 0.24* 0.35 0.35  CREATININE 0.81 0.67 0.95    Estimated Creatinine Clearance: 50.3 mL/min (by C-G formula based on SCr of 0.95 mg/dL).   Medical History: Past Medical History:  Diagnosis Date  . Acute diastolic heart failure ([place]) 12/22/2018  . Arthritis   . Atrial fibrillation, chronic 12/22/2018  . Cancer of left colon ([place]) 10/30/2018  . Cancer of sigmoid colon  12/27/2018  . Diabetes mellitus without complication ([place])   . Hypertension   . Obesity (BMI 30-39.9) 12/27/2018    Assessment: 89 yoF on apixaban at home for AFib start on IV heparin with need for IR procedures (plans noted for darin pulls when output < 10 ml/day). Heparin level now therapeutic, CBC stable.   Goal of Therapy:  Heparin level 0.3-0.7 units/ml Monitor platelets by anticoagulation protocol: Yes    Plan:  -Continue heparin 1600 units/hr -Transition back to apixaban once all procedures completed -Daily heparin level and CBC   Hildred Laser, PharmD Clinical Pharmacist **Pharmacist phone directory can now be found on [email or web address] (PW [email or web address]).  Listed under [place].

## 2019-03-17 NOTE — TOC Progression Note (Signed)
Transition of Care [facility]) - Progression Note    Patient Details  Name: Cassandra Allen MRN: [id number] Date of Birth: 06-03-1946  Transition of Care [facility]) CM/SW [place], LCSW Phone Number: 03/17/2019, 1:34 PM  Clinical Narrative: Patient has two bed offers: [facility] in [place] and [facility] in [place]. Reviewed with patient and she asked CSW to discuss with her son. She also asked if home health was an option. Called son and he stated they have decided to move forward with home health. She already has a wheelchair and walker at home. OT recommending 3-in-1. Will need to know if patient is discharging with drains or not.   Expected Discharge Plan: [place] Barriers to Discharge: Continued Medical Work up, Ship broker  Expected Discharge Plan and Services Expected Discharge Plan: [name] Choice: [place] arrangements for the past 2 months: [place]                                       Social Determinants of Health (SDOH) Interventions    Readmission Risk Interventions Readmission Risk Prevention Plan 03/13/2019  Transportation Screening Complete  Medication Review Press photographer) Complete  HRI or [place] Not Complete  HRI or Home Care Consult Pt Refusal Comments Plan for discharge to SNF.  SW Recovery Care/Counseling Consult Complete  Palliative Care Screening Not Applicable  Skilled Nursing Facility Complete  Some recent data might be hidden

## 2019-03-17 NOTE — Progress Notes (Signed)
   IR Round note via phone New regulations  Patient with history of metastatic colon cancer (s/p partial colectomy and end colostomy) with secondary postoperative abscess s/p mid upper abdomen drain (labeled 1) and LLQ drain (laveled 2) placement 03/11/2019 by Dr. Anselm Pancoast.  OP from lower drain is minimal OP from upper drain still with 10 -20 cc daily (30 cc recorded total yesterday)  OP cloudy yellow 03/16/2019 FINAL    Organism ID, Bacteria PROTEUS MIRABILIS   Organism ID, Bacteria ENTEROCOCCUS FAECALIS     Sites are clean and dry NT No bleeding Flushing easily  Will follow

## 2019-03-17 NOTE — Progress Notes (Addendum)
PROGRESS NOTE    Cassandra Allen   ZOX:[id number]  DOB: 08/10/1946  DOA: 03/10/2019 PCP: [name], MD   Brief Narrative:  Cassandra Allen 73 y.o.WF PMHx HTN, HLD, [name], diabetes type 2 controlled with complication, chronic diastolic CHF, atrial fibrillation on Eliquis, metastatic colon cancer (s/p ofpartial colectomy, chemotherapy),s/p of colostomy,CKD stage III, depression who presented with abdominal pain, nausea and vomiting.  Found to have a temp of 100.7, WBC 12.7, A-fib with RVR and HR in 170s.  Prior h/o of abdominal abscess s/p perc drain, finished a course of Augmentin, discharged to SNF.  CT in ED revealed recurrence of abscesses. IR consulted for drainage.   Subjective: Eating about 1/2 her meals. When she tries to eat more, she develops bloating and upper abdominal discomfort and mild nausea. No constipation. No cough or dyspnea.     Assessment & Plan:   Principal Problem:   Abscesses of abdominal cavity - severe sepsis - Eliquis held  - 4/29> IR guided drainage with 2 drains placed - receiving Cefepime and Flagyl - - upper drain CXs -proteus mirabilis  - LLQ drain CX -moderate proteus mirabilis - IR following daily to determine when drains should be removed  Active Problems: Bacteremia with E coli - blood culture from 4/27 > E coli sensitive to cefepime, Cefazolin, Cipro.  - cont Cefepime   Acute on chronic d CHF - with left > right pleural effusion on CR on 5/1 - has been on IV Lasix-  -CXR from today reveals minimal pleural effusion - wt 180 kg>> 162  - 8 L negative balance - change to oral Lasix tomorrow AM - start incentive spirometry  A-fib with RVR on admission - on Heparin as Apixaban was held for drain placement - d/c Heparin and resume Apixaban -cont Cardizem and Lopressor  Hypokalemia - replace- due to Lasix  Protein calorie malnutrition with weight loss - add Ensure and Dietician consult  DM2 - cont Lantus and  Novolog- will need to increase doses today - she uses NPH BID 12 U    HLD (hyperlipidemia) - Simvastatin      Depression, recurrent  - cont Zoloft    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 - managed by [name]- plan for PET once she recovers    CKD (chronic kidney disease) stage 3, GFR 30-59 ml/min  - stable      Time spent in minutes: 45- in reviewing prior notes DVT prophylaxis: Apixaban Code Status: Full code Family Communication:   Disposition Plan: will need SNF when stable to d/c Consultants:   IR  Gen surgery Procedures:   IR drains Antimicrobials:  Anti-infectives (From admission, onward)   Start     Dose/Rate Route Frequency Ordered Stop   03/11/19 1000  ceFEPIme (MAXIPIME) 2 g in sodium chloride 0.9 % 100 mL IVPB     2 g 200 mL/hr over 30 Minutes Intravenous Every 12 hours 03/10/19 2353     03/11/19 0900  metroNIDAZOLE (FLAGYL) IVPB 500 mg     500 mg 100 mL/hr over 60 Minutes Intravenous Every 8 hours 03/11/19 0101     03/10/19 2100  ceFEPIme (MAXIPIME) 2 g in sodium chloride 0.9 % 100 mL IVPB     2 g 200 mL/hr over 30 Minutes Intravenous  Once 03/10/19 2058 03/10/19 2219   03/10/19 2100  metroNIDAZOLE (FLAGYL) IVPB 500 mg     500 mg 100 mL/hr over 60 Minutes Intravenous  Once 03/10/19 2058 03/11/19 0114  Objective: Vitals:   03/16/19 2321 03/17/19 0405 03/17/19 0716 03/17/19 1131  BP: 134/90 139/74 (!) 143/93 125/73  Pulse: (!) 101 89 99 91  Resp:    20  Temp: 98.7 F (37.1 C) 98.2 F (36.8 C) 98.5 F (36.9 C) 98.4 F (36.9 C)  TempSrc: Oral Oral Oral Oral  SpO2: 95% 95% 95% 91%  Weight:  73.7 kg    Height:        Intake/Output Summary (Last 24 hours) at 03/17/2019 1206 Last data filed at 03/17/2019 0636 Gross per 24 hour  Intake 1667.29 ml  Output 2480 ml  Net -812.71 ml   Filed Weights   03/14/19 0126 03/16/19 0319 03/17/19 0405  Weight: 80.9 kg 77 kg 73.7 kg    Examination: General exam: Appears comfortable   HEENT: PERRLA, oral mucosa moist, no sclera icterus or thrush Respiratory system: Clear to auscultation. Respiratory effort normal. Cardiovascular system: S1 & S2 heard, RRR.   Gastrointestinal system: Abdomen soft, non-tender, nondistended. Normal bowel sounds. Colostomy noted. 2 drains noted.  Central nervous system: Alert and oriented. No focal neurological deficits. Extremities: No cyanosis, clubbing or edema Skin: No rashes or ulcers Psychiatry:  Mood & affect appropriate.     Data Reviewed: I have personally reviewed following labs and imaging studies  CBC: Recent Labs  Lab 03/10/19 2058  03/13/19 0550 03/14/19 0350 03/15/19 0500 03/16/19 0528 03/17/19 0430  WBC 12.7*   < > 10.4 11.4* 8.4 7.9 11.3*  NEUTROABS 11.8*  --   --   --   --   --   --   HGB 11.1*   < > 9.9* 9.7* 8.7* 9.6* 9.9*  HCT 38.6   < > 35.9* 35.4* 31.3* 34.1* 34.3*  MCV 91.9   < > 96.0 95.9 94.8 93.4 92.7  PLT 359   < > 326 350 303 303 295   < > = values in this interval not displayed.   Basic Metabolic Panel: Recent Labs  Lab 03/11/19 0557 03/12/19 0437 03/13/19 0550 03/14/19 0350 03/15/19 0500 03/16/19 0528 03/17/19 0430  NA 141 140 139 139 142 143 141  K 3.7 3.9 3.8 3.5 2.8* 2.9* 3.3*  CL 104 104 107 108 104 102 99  CO2 27 27 25 24 29  34* 34*  GLUCOSE 114* 136* 160* 241* 241* 113* 159*  BUN 20 19 17 12 10 11 17   CREATININE 0.83 0.71 0.71 0.74 0.81 0.67 0.95  CALCIUM 8.0* 8.1* 7.9* 8.0* 8.2* 8.4* 8.5*  MG  --  2.2 1.8 1.7 1.6* 1.6* 1.7  PHOS 4.0 3.7 3.5  --   --   --   --    GFR: Estimated Creatinine Clearance: 50.3 mL/min (by C-G formula based on SCr of 0.95 mg/dL). Liver Function Tests: Recent Labs  Lab 03/10/19 2058 03/14/19 0350 03/16/19 0528  AST 237* 12*  --   ALT 95* 42  --   ALKPHOS 1,377* 528*  --   BILITOT 2.3* 1.0  --   PROT 6.0* 5.3*  --   ALBUMIN 2.5* 2.1* 2.8*   Recent Labs  Lab 03/10/19 2058  LIPASE 38   No results for input(s): AMMONIA in the last 168  hours. Coagulation Profile: Recent Labs  Lab 03/11/19 0238  INR 2.1*   Cardiac Enzymes: Recent Labs  Lab 03/10/19 2058 03/11/19 0238 03/11/19 0557  TROPONINI 0.08* 0.06* 0.05*   BNP (last 3 results) No results for input(s): PROBNP in the last 8760 hours. HbA1C: No results for  input(s): HGBA1C in the last 72 hours. CBG: Recent Labs  Lab 03/16/19 2034 03/17/19 0017 03/17/19 0350 03/17/19 0734 03/17/19 1129  GLUCAP 240* 219* 152* 132* 310*   Lipid Profile: No results for input(s): CHOL, HDL, LDLCALC, TRIG, CHOLHDL, LDLDIRECT in the last 72 hours. Thyroid Function Tests: No results for input(s): TSH, T4TOTAL, FREET4, T3FREE, THYROIDAB in the last 72 hours. Anemia Panel: No results for input(s): VITAMINB12, FOLATE, FERRITIN, TIBC, IRON, RETICCTPCT in the last 72 hours. Urine analysis:    Component Value Date/Time   COLORURINE AMBER (A) 03/10/2019 0041   APPEARANCEUR CLEAR 03/10/2019 0041   LABSPEC 1.032 (H) 03/10/2019 0041   PHURINE 5.0 03/10/2019 0041   GLUCOSEU NEGATIVE 03/10/2019 0041   HGBUR SMALL (A) 03/10/2019 0041   BILIRUBINUR NEGATIVE 03/10/2019 0041   KETONESUR NEGATIVE 03/10/2019 0041   PROTEINUR 30 (A) 03/10/2019 0041   UROBILINOGEN 0.2 03/25/2015 2317   NITRITE POSITIVE (A) 03/10/2019 0041   LEUKOCYTESUR MODERATE (A) 03/10/2019 0041   Sepsis Labs: @LABRCNTIP (procalcitonin:4,lacticidven:4) ) Recent Results (from the past 240 hour(s))  Urine culture     Status: None   Collection Time: 03/10/19  8:41 PM  Result Value Ref Range Status   Specimen Description URINE, CATHETERIZED  Final   Special Requests NONE  Final   Culture   Final    NO GROWTH Performed at [facility], [place] [address] [place], [place] [address]    Report Status 03/13/2019 FINAL  Final  Blood Culture (routine x 2)     Status: Abnormal   Collection Time: 03/10/19  9:37 PM  Result Value Ref Range Status   Specimen Description BLOOD RIGHT HAND  Final   Special Requests    Final    BOTTLES DRAWN AEROBIC AND ANAEROBIC Blood Culture results may not be optimal due to an inadequate volume of blood received in culture bottles   Culture  Setup Time   Final    GRAM NEGATIVE RODS IN BOTH AEROBIC AND ANAEROBIC BOTTLES CRITICAL RESULT CALLED TO, READ BACK BY AND VERIFIED WITH: Cristopher Estimable PharmD 11:10 03/11/19 ([email or web address]) Performed at [facility], [place] [address], [place], [place] [address]    Culture ESCHERICHIA COLI (A)  Final   Report Status 03/13/2019 FINAL  Final   Organism ID, Bacteria ESCHERICHIA COLI  Final      Susceptibility   Escherichia coli - MIC*    AMPICILLIN >=32 RESISTANT Resistant     CEFAZOLIN <=4 SENSITIVE Sensitive     CEFEPIME <=1 SENSITIVE Sensitive     CEFTAZIDIME <=1 SENSITIVE Sensitive     CEFTRIAXONE <=1 SENSITIVE Sensitive     CIPROFLOXACIN <=0.25 SENSITIVE Sensitive     GENTAMICIN <=1 SENSITIVE Sensitive     IMIPENEM <=0.25 SENSITIVE Sensitive     TRIMETH/SULFA <=20 SENSITIVE Sensitive     AMPICILLIN/SULBACTAM >=32 RESISTANT Resistant     PIP/TAZO <=4 SENSITIVE Sensitive     Extended ESBL NEGATIVE Sensitive     * ESCHERICHIA COLI  Blood Culture ID Panel (Reflexed)     Status: Abnormal   Collection Time: 03/10/19  9:37 PM  Result Value Ref Range Status   Enterococcus species NOT DETECTED NOT DETECTED Final   Listeria monocytogenes NOT DETECTED NOT DETECTED Final   Staphylococcus species NOT DETECTED NOT DETECTED Final   Staphylococcus aureus (BCID) NOT DETECTED NOT DETECTED Final   Streptococcus species NOT DETECTED NOT DETECTED Final   Streptococcus agalactiae NOT DETECTED NOT DETECTED Final   Streptococcus pneumoniae NOT DETECTED NOT DETECTED  Final   Streptococcus pyogenes NOT DETECTED NOT DETECTED Final   Acinetobacter baumannii NOT DETECTED NOT DETECTED Final   Enterobacteriaceae species DETECTED (A) NOT DETECTED Final    Comment: Enterobacteriaceae represent a large family of gram-negative bacteria, not a single  organism. CRITICAL RESULT CALLED TO, READ BACK BY AND VERIFIED WITH: Cristopher Estimable PharmD 11:10 03/11/19 ([email or web address])    Enterobacter cloacae complex NOT DETECTED NOT DETECTED Final   Escherichia coli DETECTED (A) NOT DETECTED Final    Comment: CRITICAL RESULT CALLED TO, READ BACK BY AND VERIFIED WITH: Cristopher Estimable PharmD 11:10 03/11/19 ([email or web address])    Klebsiella oxytoca NOT DETECTED NOT DETECTED Final   Klebsiella pneumoniae NOT DETECTED NOT DETECTED Final   Proteus species NOT DETECTED NOT DETECTED Final   Serratia marcescens NOT DETECTED NOT DETECTED Final   Carbapenem resistance NOT DETECTED NOT DETECTED Final   Haemophilus influenzae NOT DETECTED NOT DETECTED Final   Neisseria meningitidis NOT DETECTED NOT DETECTED Final   Pseudomonas aeruginosa NOT DETECTED NOT DETECTED Final   Candida albicans NOT DETECTED NOT DETECTED Final   Candida glabrata NOT DETECTED NOT DETECTED Final   Candida krusei NOT DETECTED NOT DETECTED Final   Candida parapsilosis NOT DETECTED NOT DETECTED Final   Candida tropicalis NOT DETECTED NOT DETECTED Final    Comment: Performed at [facility], [place] [address] [place], [place] [address]  Blood Culture (routine x 2)     Status: Abnormal   Collection Time: 03/10/19  9:50 PM  Result Value Ref Range Status   Specimen Description BLOOD LEFT ARM  Final   Special Requests   Final    BOTTLES DRAWN AEROBIC AND ANAEROBIC Blood Culture adequate volume   Culture  Setup Time   Final    GRAM NEGATIVE RODS IN BOTH AEROBIC AND ANAEROBIC BOTTLES CRITICAL VALUE NOTED.  VALUE IS CONSISTENT WITH PREVIOUSLY REPORTED AND CALLED VALUE.    Culture (A)  Final    ESCHERICHIA COLI SUSCEPTIBILITIES PERFORMED ON PREVIOUS CULTURE WITHIN THE LAST 5 DAYS. Performed at [facility], [place] [address], [place], [place] [address]    Report Status 03/13/2019 FINAL  Final  MRSA PCR Screening     Status: None   Collection Time: 03/11/19  3:47 AM  Result Value Ref Range Status    MRSA by PCR NEGATIVE NEGATIVE Final    Comment:        The GeneXpert MRSA Assay (FDA approved for NASAL specimens only), is one component of a comprehensive MRSA colonization surveillance program. It is not intended to diagnose MRSA infection nor to guide or monitor treatment for MRSA infections. Performed at [facility], [place] [address] [place], [place] [address]   Aerobic/Anaerobic Culture (surgical/deep wound)     Status: None   Collection Time: 03/11/19  3:00 PM  Result Value Ref Range Status   Specimen Description ABSCESS DRAINAGE  Final   Special Requests MID UPPER ABDOMEN  Final   Gram Stain   Final    ABUNDANT WBC PRESENT,BOTH PMN AND MONONUCLEAR MODERATE GRAM VARIABLE ROD FEW GRAM NEGATIVE RODS Performed at [facility], [place] [address], [place], [place] [address]    Culture   Final    MODERATE PROTEUS MIRABILIS FEW ENTEROCOCCUS FAECALIS MIXED ANAEROBIC FLORA PRESENT.  CALL LAB IF FURTHER IID REQUIRED.    Report Status 03/16/2019 FINAL  Final   Organism ID, Bacteria PROTEUS MIRABILIS  Final   Organism ID, Bacteria ENTEROCOCCUS FAECALIS  Final  Susceptibility   Enterococcus faecalis - MIC*    AMPICILLIN <=2 SENSITIVE Sensitive     VANCOMYCIN 1 SENSITIVE Sensitive     GENTAMICIN SYNERGY SENSITIVE Sensitive     * FEW ENTEROCOCCUS FAECALIS   Proteus mirabilis - MIC*    AMPICILLIN <=2 SENSITIVE Sensitive     CEFAZOLIN <=4 SENSITIVE Sensitive     CEFEPIME <=1 SENSITIVE Sensitive     CEFTAZIDIME <=1 SENSITIVE Sensitive     CEFTRIAXONE <=1 SENSITIVE Sensitive     CIPROFLOXACIN <=0.25 SENSITIVE Sensitive     GENTAMICIN <=1 SENSITIVE Sensitive     IMIPENEM 2 SENSITIVE Sensitive     TRIMETH/SULFA <=20 SENSITIVE Sensitive     AMPICILLIN/SULBACTAM <=2 SENSITIVE Sensitive     PIP/TAZO <=4 SENSITIVE Sensitive     * MODERATE PROTEUS MIRABILIS  Aerobic/Anaerobic Culture (surgical/deep wound)     Status: None   Collection Time: 03/11/19  3:00 PM  Result  Value Ref Range Status   Specimen Description ABSCESS DRAINAGE  Final   Special Requests LEFT LOWER ABDOMEN  Final   Gram Stain   Final    ABUNDANT WBC PRESENT,BOTH PMN AND MONONUCLEAR RARE GRAM POSITIVE COCCI FEW GRAM NEGATIVE RODS FEW GRAM VARIABLE ROD Performed at [facility], [place] [address] , [place] [address]    Culture   Final    MODERATE PROTEUS MIRABILIS MIXED ANAEROBIC FLORA PRESENT.  CALL LAB IF FURTHER IID REQUIRED.    Report Status 03/16/2019 FINAL  Final   Organism ID, Bacteria PROTEUS MIRABILIS  Final      Susceptibility   Proteus mirabilis - MIC*    AMPICILLIN <=2 SENSITIVE Sensitive     CEFAZOLIN <=4 SENSITIVE Sensitive     CEFEPIME <=1 SENSITIVE Sensitive     CEFTAZIDIME <=1 SENSITIVE Sensitive     CEFTRIAXONE <=1 SENSITIVE Sensitive     CIPROFLOXACIN <=0.25 SENSITIVE Sensitive     GENTAMICIN <=1 SENSITIVE Sensitive     IMIPENEM 2 SENSITIVE Sensitive     TRIMETH/SULFA <=20 SENSITIVE Sensitive     AMPICILLIN/SULBACTAM <=2 SENSITIVE Sensitive     PIP/TAZO <=4 SENSITIVE Sensitive     * MODERATE PROTEUS MIRABILIS  Novel Coronavirus, NAA (hospital order; send-out to ref lab)     Status: None   Collection Time: 03/14/19  3:35 PM  Result Value Ref Range Status   SARS-CoV-2, NAA NOT DETECTED NOT DETECTED Final    Comment: (NOTE) This test was developed and its performance characteristics determined by [employer]. This test has not been FDA cleared or approved. This test has been authorized by FDA under an Emergency Use Authorization (EUA). This test is only authorized for the duration of time the declaration that circumstances exist justifying the authorization of the emergency use of in vitro diagnostic tests for detection of SARS-CoV-2 virus and/or diagnosis of COVID-19 infection under section 564(b)(1) of the Act, 21 U.S.C. 811XBW-6(O)(0), unless the authorization is terminated or revoked sooner. When diagnostic testing is negative, the  possibility of a false negative result should be considered in the context of a patient's recent exposures and the presence of clinical signs and symptoms consistent with COVID-19. An individual without symptoms of COVID-19 and who is not shedding SARS-CoV-2 virus would expect to have a negative (not detected) result in this assay. Performed  At: [facility] [address] [place], [place] [id number] Rush Farmer MD AG:[id number]    [place]  Final    Comment: Performed at [facility], [place]  [address], [place], [place] [address]         Radiology Studies: Dg Chest [place] 1 View  Result Date: 03/17/2019 CLINICAL DATA:  73 year old female with shortness of breath EXAM: PORTABLE CHEST 1 VIEW COMPARISON:  CT 03/14/2019, chest x-ray 03/13/2019 FINDINGS: Cardiomediastinal silhouette unchanged in size and contour. Right IJ port catheter with the tip appearing to terminate superior vena cava, unchanged. Low lung volumes with crowded interstitium. Similar appearance of opacity at the left lung base with blunting of left costophrenic angle and obscuration of left hemidiaphragm. No interlobular septal thickening. IMPRESSION: Persisting left basilar opacity likely combination of small pleural effusion and associated atelectasis/consolidation. Unchanged right port catheter. Electronically Signed   By: Corrie Mckusick D.O.   On: 03/17/2019 08:01      Scheduled Meds: . apixaban  5 mg Oral BID  . diltiazem  240 mg Oral Daily  . feeding supplement (GLUCERNA SHAKE)  237 mL Oral TID BM  . furosemide  40 mg Intravenous BID  . insulin aspart  0-15 Units Subcutaneous TID WC  . insulin aspart  4 Units Subcutaneous TID WC  . insulin glargine  10 Units Subcutaneous Daily  . metoprolol tartrate  12.5 mg Oral BID  . pantoprazole (PROTONIX) IV  40 mg Intravenous QHS  . potassium chloride  40 mEq Oral Q4H  . sertraline  125 mg Oral Daily  . simvastatin  20 mg Oral q1800   . sodium chloride flush  5 mL Intracatheter Q8H   Continuous Infusions: . sodium chloride 75 mL/hr at 03/13/19 0500  . ceFEPime (MAXIPIME) IV 2 g (03/17/19 0931)  . metronidazole 500 mg (03/17/19 0753)     LOS: 6 days      Debbe Odea, MD [facility] Pager: [email or web address] Password [email or web address] 03/17/2019, 12:06 PM

## 2019-03-18 LAB — GLUCOSE, CAPILLARY
Glucose-Capillary: 203 mg/dL — ABNORMAL HIGH (ref 70–99)
Glucose-Capillary: 244 mg/dL — ABNORMAL HIGH (ref 70–99)
Glucose-Capillary: 231 mg/dL — ABNORMAL HIGH (ref 70–99)
Glucose-Capillary: 305 mg/dL — ABNORMAL HIGH (ref 70–99)

## 2019-03-18 LAB — BASIC METABOLIC PANEL
Anion gap: 9 (ref 5–15)
BUN: 17 mg/dL (ref 8–23)
CO2: 32 mmol/L (ref 22–32)
Calcium: 8.8 mg/dL — ABNORMAL LOW (ref 8.9–10.3)
Chloride: 96 mmol/L — ABNORMAL LOW (ref 98–111)
Creatinine, Ser: 0.84 mg/dL (ref 0.44–1.00)
GFR calc Af Amer: >60 mL/min (ref >60)
GFR calc non Af Amer: >60 mL/min (ref >60)
Glucose, Bld: 190 mg/dL — ABNORMAL HIGH (ref 70–99)
Potassium: 3.9 mmol/L (ref 3.5–5.1)
Sodium: 137 mmol/L (ref 135–145)

## 2019-03-18 LAB — CBC
HCT: 35.5 % — ABNORMAL LOW (ref 36.0–46.0)
Hemoglobin: 10.3 g/dL — ABNORMAL LOW (ref 12.0–15.0)
MCH: 27.1 pg (ref 26.0–34.0)
MCHC: 29 g/dL — ABNORMAL LOW (ref 30.0–36.0)
MCV: 93.4 fL (ref 80.0–100.0)
Platelets: 288 10*3/uL (ref 150–400)
RBC: 3.8 MIL/uL — ABNORMAL LOW (ref 3.87–5.11)
RDW: 18.8 % — ABNORMAL HIGH (ref 11.5–15.5)
WBC: 9 10*3/uL (ref 4.0–10.5)
nRBC: 0.2 % (ref 0.0–0.2)

## 2019-03-18 MED ORDER — PRO-STAT SUGAR FREE PO LIQD
30.0000 mL | Freq: Two times a day (BID) | ORAL | Status: DC
  Administered 2019-03-18 – 2019-03-19 (×4): 30 mL via ORAL
  Filled 2019-03-18 (×5): qty 30

## 2019-03-18 MED ORDER — AMOXICILLIN 500 MG PO CAPS
500.0000 mg | ORAL_CAPSULE | Freq: Three times a day (TID) | ORAL | Status: DC
  Administered 2019-03-18 – 2019-03-20 (×7): 500 mg via ORAL
  Filled 2019-03-18 (×8): qty 1

## 2019-03-18 MED ORDER — SIMVASTATIN 20 MG PO TABS
20.0000 mg | ORAL_TABLET | Freq: Every day | ORAL | Status: DC
  Administered 2019-03-18 – 2019-03-19 (×2): 20 mg via ORAL
  Filled 2019-03-18 (×2): qty 1

## 2019-03-18 MED ORDER — ATORVASTATIN CALCIUM 10 MG PO TABS: 10.0000 mg | ORAL_TABLET | Freq: Every day | ORAL | Status: DC

## 2019-03-18 MED ORDER — ADULT MULTIVITAMIN W/MINERALS CH
1.0000 | ORAL_TABLET | Freq: Every day | ORAL | Status: DC
  Administered 2019-03-18 – 2019-03-20 (×3): 1 via ORAL
  Filled 2019-03-18 (×3): qty 1

## 2019-03-18 NOTE — TOC Progression Note (Addendum)
Transition of Care [facility]) - Progression Note    Patient Details  Name: NICA FRISKE MRN: [id number] Date of Birth: Apr 06, 1946  Transition of Care [facility]) CM/SW [place], LCSW Phone Number: 03/18/2019, 1:07 PM  Clinical Narrative: Patient will discharge home with drains. Per MD will switch to PO antibiotics at discharge. Made referral for home health PT, OT, RN, aide, and social worker to [place] [facility].   4:37 pm: Lyanne Co with [place] regarding 3-in-1.  Expected Discharge Plan: [place] Barriers to Discharge: Continued Medical Work up, Ship broker  Expected Discharge Plan and Services Expected Discharge Plan: [place] Choice: [place] arrangements for the past 2 months: [place]                                       Social Determinants of Health (SDOH) Interventions    Readmission Risk Interventions Readmission Risk Prevention Plan 03/13/2019  Transportation Screening Complete  Medication Review Press photographer) Complete  HRI or [place] Not Complete  HRI or Home Care Consult Pt Refusal Comments Plan for discharge to SNF.  SW Recovery Care/Counseling Consult Complete  Palliative Care Screening Not Applicable  Skilled Nursing Facility Complete  Some recent data might be hidden

## 2019-03-18 NOTE — Care Management Important Message (Signed)
Important Message  Patient Details  Name: Cassandra Allen MRN: [id number] Date of Birth: 11-Aug-1946   Medicare Important Message Given:  Yes    Jenness Stemler 03/18/2019, 11:16 AM

## 2019-03-18 NOTE — Progress Notes (Signed)
Dr. Jiles Harold, MD consulted re: drain management.  States OK for patient to go home w/drains and return to clinic in one week.  Attending to be notified.

## 2019-03-18 NOTE — Progress Notes (Signed)
Initial Nutrition Assessment  RD working remotely.  DOCUMENTATION CODES:   Obesity unspecified  INTERVENTION:   - Continue Glucerna Shake po TID, each supplement provides 220 kcal and 10 grams of protein  - Add Pro-stat 30 ml BID, each supplement provides 100 kcal and 15 grams of protein  - Add MVI with minerals daily  NUTRITION DIAGNOSIS:   Increased nutrient needs related to wound healing, acute illness as evidenced by estimated needs.  GOAL:   Patient will meet greater than or equal to 90% of their needs  MONITOR:   PO intake, Supplement acceptance, Weight trends, I & O's, Labs, Skin  REASON FOR ASSESSMENT:   Consult Assessment of nutrition requirement/status  ASSESSMENT:   73 year old female who presented to the ED on 4/27 with left-sided abdominal pain. PMH of metastatic colon cancer previously on chemotherapy s/p partial colectomy and colostomy in February 2020, CKD stage III, HTN, HLD, DM, CHF, A-fib on Eliquis. Pt had a percutaneous drain placed by IR in March for abdominal abscess that was removed on 4/22. CT at that time showed enlarging abscess and pt was scheduled for a repeat drain placement. Pt admitted with sepsis secondary to abdominal cavity abscess.  4/28 - s/p CT guided drain placements in mid upper abdominal collection and left lower abdominal abscess collection  Attempted to speak with pt via phone call to room but pt did not answer.  Reviewed weight history in chart. Pt has experienced a 5.3 kg weight loss since 12/18/18. This is a 6.5% weight loss in 3 months which is not quite significant for timeframe.  Reviewed weights during admission. Pt with a 3 lb weight loss since admission which is likely related to negative fluid balance. Will continue to monitor trends.  Reviewed RN edema assessment. Pt with non-pitting edema to BLE.  Pt has been tolerating a carb modified diet since 5/1 with meal completion between 50-100% x 7 meals.  MD has ordered  Glucerna Shake to aid pt in meeting kcal and protein needs. Will plan to continue with Glucerna Shake and add both Pro-stat and a daily MVI.  Medications reviewed and include: Glucerna TID, Lasix 40 mg daily, SSI, Novolog 6 units TID with meals, Lantus 12 units daily, Protonix, IV abx  Labs reviewed. CBG's: 203, 136, 310 x 24 hours  UOP: 2000 ml x 24 hours JP drain 1: 40 ml x 24 hours JP drain 2: 30 ml x 24 hours Colostomy: 600 ml x 24 hours I/O's: -9.4 L since admit  NUTRITION - FOCUSED PHYSICAL EXAM:  Unable to complete at this time. RD working remotely.  Diet Order:   Diet Order            Diet Carb Modified Fluid consistency: Thin; Room service appropriate? Yes  Diet effective now              EDUCATION NEEDS:   Not appropriate for education at this time  Skin:  Skin Assessment: Skin Integrity Issues: Incisions: closed incision to mid-abdomen  Last BM:  03/18/19 colostomy  Height:   Ht Readings from Last 1 Encounters:  03/11/19 5\' 2"  (1.575 m)    Weight:   Wt Readings from Last 1 Encounters:  03/18/19 76.6 kg    Ideal Body Weight:  50 kg  BMI:  Body mass index is 30.89 kg/m.  Estimated Nutritional Needs:   Kcal:  1650-1850  Protein:  90-105 grams  Fluid:  1.7-1.9 L    Gaynell Face, MS, RD, LDN Inpatient Clinical  Dietitian Pager: [phone] Weekend/After Hours: [phone]

## 2019-03-18 NOTE — Progress Notes (Signed)
IR rounding note via telephone per new regulations. Spoke with Angela Nevin, RN.  Patient with history of metastatic colon cancer (s/p partial colectomy and end colostomy) with secondary postoperative abscess s/p mid upper abdomen drain (labeled 1) and LLQ drain (labeled 2) placement 03/11/2019 by Dr. Anselm Pancoast.  Drain 1 (mid upper abdominal drain) site c/d/i with approximately 15 cc of clear yellow/pale fluid in JP bulb. Drain 2 (LLQ abdominal drain) site c/d/i with minimal output of clear yellow in JP bulb. RN reports both drains flush/aspirate without resistance.  Continue current drain management- continue with Qshift flushes/monitor of output. Appreciate and agree with CCS and TRH management. IR to follow.   Bea Graff Louk, PA-C 03/18/2019, 7:57 AM

## 2019-03-18 NOTE — Progress Notes (Signed)
PROGRESS NOTE    Cassandra Allen   ZDG:[id number]  DOB: 10-18-1946  DOA: 03/10/2019 PCP: [name], MD   Brief Narrative:  Cassandra Allen 73 y.o.WF PMHx HTN, HLD, [name], diabetes type 2 controlled with complication, chronic diastolic CHF, atrial fibrillation on Eliquis, metastatic colon cancer (s/p ofpartial colectomy, chemotherapy),s/p of colostomy,CKD stage III, depression who presented with abdominal pain, nausea and vomiting.  Found to have a temp of 100.7, WBC 12.7, A-fib with RVR and HR in 170s.  Prior h/o of abdominal abscess s/p perc drain, finished a course of Augmentin, discharged to SNF.  CT in ED revealed recurrence of abscesses. IR consulted for drainage.   Subjective: No complaints other than feeling very weak.     Assessment & Plan:   Principal Problem:   Abscesses of abdominal cavity - severe sepsis - Eliquis held for drainage - 4/29> IR guided drainage with 2 drains placed - receiving Cefepime and Flagyl - - upper drain CXs -proteus mirabilis  - LLQ drain CX -  e faecalis  - IR feels that she will need to go home with drains - I will d/c Flagyl- Continue Cefepime  Active Problems: Bacteremia with E coli - blood culture from 4/27 > E coli sensitive to cefepime, Cefazolin, Cipro.  - Cont Cefepime for now  - today is day 9 of treatment  Acute on chronic d CHF - with left > right pleural effusion on CR on 5/1 - has been treated with IV Lasix-  -CXR repeated-  reveals minimal pleural effusion now - wt 180 kg>> 162  - 8 L negative balance - changed to oral Lasix today - started incentive spirometry  A-fib with RVR on admission - on Heparin as Apixaban was held for drain placement - 5/4> d/c Heparin and resume Apixaban -cont Cardizem and Lopressor  Hypokalemia - replace- due to Lasix  Protein calorie malnutrition with weight loss - add Ensure and Dietician consult  DM2 - cont Lantus and Novolog- will need to increase dose  of lantus again today - she uses NPH BID 12 U    HLD (hyperlipidemia) - Simvastatin      Depression, recurrent  - cont Zoloft    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 - managed by [name] who is aware of her admission- plan for PET once she recovers    CKD (chronic kidney disease) stage 3, GFR 30-59 ml/min  - stable      Time spent in minutes: 37- in reviewing prior notes DVT prophylaxis: Apixaban Code Status: Full code Family Communication:   Disposition Plan: will need SNF when stable to d/c but states she wants to go home - - I recommend 1-2 more days of IV antibiotics and PT- have asked RN to ambulate her 2 x day- have put in home health ordered Consultants:   IR  Gen surgery Procedures:   IR drains Antimicrobials:  Anti-infectives (From admission, onward)   Start     Dose/Rate Route Frequency Ordered Stop   03/11/19 1000  ceFEPIme (MAXIPIME) 2 g in sodium chloride 0.9 % 100 mL IVPB     2 g 200 mL/hr over 30 Minutes Intravenous Every 12 hours 03/10/19 2353     03/11/19 0900  metroNIDAZOLE (FLAGYL) IVPB 500 mg     500 mg 100 mL/hr over 60 Minutes Intravenous Every 8 hours 03/11/19 0101     03/10/19 2100  ceFEPIme (MAXIPIME) 2 g in sodium chloride 0.9 % 100 mL IVPB  2 g 200 mL/hr over 30 Minutes Intravenous  Once 03/10/19 2058 03/10/19 2219   03/10/19 2100  metroNIDAZOLE (FLAGYL) IVPB 500 mg     500 mg 100 mL/hr over 60 Minutes Intravenous  Once 03/10/19 2058 03/11/19 0114       Objective: Vitals:   03/17/19 2329 03/18/19 0328 03/18/19 0831 03/18/19 1038  BP: (!) 142/75 (!) 146/75 (!) 157/86 116/66  Pulse: 95 97  92  Resp: (!) 28 19  (!) 21  Temp: 98.1 F (36.7 C) 98.6 F (37 C) 97.9 F (36.6 C) 98.6 F (37 C)  TempSrc: Oral Oral Oral Oral  SpO2: 90% 96%  95%  Weight:  76.6 kg    Height:        Intake/Output Summary (Last 24 hours) at 03/18/2019 1244 Last data filed at 03/18/2019 1000 Gross per 24 hour  Intake 597.03 ml  Output 2280 ml   Net -1682.97 ml   Filed Weights   03/16/19 0319 03/17/19 0405 03/18/19 0328  Weight: 77 kg 73.7 kg 76.6 kg    Examination: General exam: Appears comfortable  HEENT: PERRLA, oral mucosa moist, no sclera icterus or thrush Respiratory system: Clear to auscultation. Respiratory effort normal. Cardiovascular system: S1 & S2 heard,  No murmurs  Gastrointestinal system: Abdomen soft, non-tender, nondistended. Normal bowel sounds  2 drains present in abdomen with milky yellow fluid, colostomy present Central nervous system: Alert and oriented. No focal neurological deficits. Extremities: No cyanosis, clubbing or edema Skin: No rashes or ulcers Psychiatry:  Mood & affect appropriate.     Data Reviewed: I have personally reviewed following labs and imaging studies  CBC: Recent Labs  Lab 03/14/19 0350 03/15/19 0500 03/16/19 0528 03/17/19 0430 03/18/19 0753  WBC 11.4* 8.4 7.9 11.3* 9.0  HGB 9.7* 8.7* 9.6* 9.9* 10.3*  HCT 35.4* 31.3* 34.1* 34.3* 35.5*  MCV 95.9 94.8 93.4 92.7 93.4  PLT 350 303 303 295 527   Basic Metabolic Panel: Recent Labs  Lab 03/12/19 0437 03/13/19 0550 03/14/19 0350 03/15/19 0500 03/16/19 0528 03/17/19 0430 03/18/19 0753  NA 140 139 139 142 143 141 137  K 3.9 3.8 3.5 2.8* 2.9* 3.3* 3.9  CL 104 107 108 104 102 99 96*  CO2 27 25 24 29  34* 34* 32  GLUCOSE 136* 160* 241* 241* 113* 159* 190*  BUN 19 17 12 10 11 17 17   CREATININE 0.71 0.71 0.74 0.81 0.67 0.95 0.84  CALCIUM 8.1* 7.9* 8.0* 8.2* 8.4* 8.5* 8.8*  MG 2.2 1.8 1.7 1.6* 1.6* 1.7  --   PHOS 3.7 3.5  --   --   --   --   --    GFR: Estimated Creatinine Clearance: 58 mL/min (by C-G formula based on SCr of 0.84 mg/dL). Liver Function Tests: Recent Labs  Lab 03/14/19 0350 03/16/19 0528  AST 12*  --   ALT 42  --   ALKPHOS 528*  --   BILITOT 1.0  --   PROT 5.3*  --   ALBUMIN 2.1* 2.8*   No results for input(s): LIPASE, AMYLASE in the last 168 hours. No results for input(s): AMMONIA in the  last 168 hours. Coagulation Profile: No results for input(s): INR, PROTIME in the last 168 hours. Cardiac Enzymes: No results for input(s): CKTOTAL, CKMB, CKMBINDEX, TROPONINI in the last 168 hours. BNP (last 3 results) No results for input(s): PROBNP in the last 8760 hours. HbA1C: No results for input(s): HGBA1C in the last 72 hours. CBG: Recent Labs  Lab  03/17/19 0734 03/17/19 1129 03/17/19 1634 03/18/19 0635 03/18/19 1041  GLUCAP 132* 310* 136* 203* 244*   Lipid Profile: No results for input(s): CHOL, HDL, LDLCALC, TRIG, CHOLHDL, LDLDIRECT in the last 72 hours. Thyroid Function Tests: No results for input(s): TSH, T4TOTAL, FREET4, T3FREE, THYROIDAB in the last 72 hours. Anemia Panel: No results for input(s): VITAMINB12, FOLATE, FERRITIN, TIBC, IRON, RETICCTPCT in the last 72 hours. Urine analysis:    Component Value Date/Time   COLORURINE AMBER (A) 03/10/2019 0041   APPEARANCEUR CLEAR 03/10/2019 0041   LABSPEC 1.032 (H) 03/10/2019 0041   PHURINE 5.0 03/10/2019 0041   GLUCOSEU NEGATIVE 03/10/2019 0041   HGBUR SMALL (A) 03/10/2019 0041   BILIRUBINUR NEGATIVE 03/10/2019 0041   KETONESUR NEGATIVE 03/10/2019 0041   PROTEINUR 30 (A) 03/10/2019 0041   UROBILINOGEN 0.2 03/25/2015 2317   NITRITE POSITIVE (A) 03/10/2019 0041   LEUKOCYTESUR MODERATE (A) 03/10/2019 0041   Sepsis Labs: @LABRCNTIP (procalcitonin:4,lacticidven:4) ) Recent Results (from the past 240 hour(s))  Urine culture     Status: None   Collection Time: 03/10/19  8:41 PM  Result Value Ref Range Status   Specimen Description URINE, CATHETERIZED  Final   Special Requests NONE  Final   Culture   Final    NO GROWTH Performed at [facility], [place] [address] [place], [place] [address]    Report Status 03/13/2019 FINAL  Final  Blood Culture (routine x 2)     Status: Abnormal   Collection Time: 03/10/19  9:37 PM  Result Value Ref Range Status   Specimen Description BLOOD RIGHT HAND  Final   Special  Requests   Final    BOTTLES DRAWN AEROBIC AND ANAEROBIC Blood Culture results may not be optimal due to an inadequate volume of blood received in culture bottles   Culture  Setup Time   Final    GRAM NEGATIVE RODS IN BOTH AEROBIC AND ANAEROBIC BOTTLES CRITICAL RESULT CALLED TO, READ BACK BY AND VERIFIED WITH: Cristopher Estimable PharmD 11:10 03/11/19 ([email or web address]) Performed at [facility], [place] [address], [place], [place] [address]    Culture ESCHERICHIA COLI (A)  Final   Report Status 03/13/2019 FINAL  Final   Organism ID, Bacteria ESCHERICHIA COLI  Final      Susceptibility   Escherichia coli - MIC*    AMPICILLIN >=32 RESISTANT Resistant     CEFAZOLIN <=4 SENSITIVE Sensitive     CEFEPIME <=1 SENSITIVE Sensitive     CEFTAZIDIME <=1 SENSITIVE Sensitive     CEFTRIAXONE <=1 SENSITIVE Sensitive     CIPROFLOXACIN <=0.25 SENSITIVE Sensitive     GENTAMICIN <=1 SENSITIVE Sensitive     IMIPENEM <=0.25 SENSITIVE Sensitive     TRIMETH/SULFA <=20 SENSITIVE Sensitive     AMPICILLIN/SULBACTAM >=32 RESISTANT Resistant     PIP/TAZO <=4 SENSITIVE Sensitive     Extended ESBL NEGATIVE Sensitive     * ESCHERICHIA COLI  Blood Culture ID Panel (Reflexed)     Status: Abnormal   Collection Time: 03/10/19  9:37 PM  Result Value Ref Range Status   Enterococcus species NOT DETECTED NOT DETECTED Final   Listeria monocytogenes NOT DETECTED NOT DETECTED Final   Staphylococcus species NOT DETECTED NOT DETECTED Final   Staphylococcus aureus (BCID) NOT DETECTED NOT DETECTED Final   Streptococcus species NOT DETECTED NOT DETECTED Final   Streptococcus agalactiae NOT DETECTED NOT DETECTED Final   Streptococcus pneumoniae NOT DETECTED NOT DETECTED Final   Streptococcus pyogenes NOT DETECTED NOT DETECTED Final  Acinetobacter baumannii NOT DETECTED NOT DETECTED Final   Enterobacteriaceae species DETECTED (A) NOT DETECTED Final    Comment: Enterobacteriaceae represent a large family of gram-negative bacteria, not a  single organism. CRITICAL RESULT CALLED TO, READ BACK BY AND VERIFIED WITH: Cristopher Estimable PharmD 11:10 03/11/19 ([email or web address])    Enterobacter cloacae complex NOT DETECTED NOT DETECTED Final   Escherichia coli DETECTED (A) NOT DETECTED Final    Comment: CRITICAL RESULT CALLED TO, READ BACK BY AND VERIFIED WITH: Cristopher Estimable PharmD 11:10 03/11/19 ([email or web address])    Klebsiella oxytoca NOT DETECTED NOT DETECTED Final   Klebsiella pneumoniae NOT DETECTED NOT DETECTED Final   Proteus species NOT DETECTED NOT DETECTED Final   Serratia marcescens NOT DETECTED NOT DETECTED Final   Carbapenem resistance NOT DETECTED NOT DETECTED Final   Haemophilus influenzae NOT DETECTED NOT DETECTED Final   Neisseria meningitidis NOT DETECTED NOT DETECTED Final   Pseudomonas aeruginosa NOT DETECTED NOT DETECTED Final   Candida albicans NOT DETECTED NOT DETECTED Final   Candida glabrata NOT DETECTED NOT DETECTED Final   Candida krusei NOT DETECTED NOT DETECTED Final   Candida parapsilosis NOT DETECTED NOT DETECTED Final   Candida tropicalis NOT DETECTED NOT DETECTED Final    Comment: Performed at [facility], [place] [address] [place], [place] [address]  Blood Culture (routine x 2)     Status: Abnormal   Collection Time: 03/10/19  9:50 PM  Result Value Ref Range Status   Specimen Description BLOOD LEFT ARM  Final   Special Requests   Final    BOTTLES DRAWN AEROBIC AND ANAEROBIC Blood Culture adequate volume   Culture  Setup Time   Final    GRAM NEGATIVE RODS IN BOTH AEROBIC AND ANAEROBIC BOTTLES CRITICAL VALUE NOTED.  VALUE IS CONSISTENT WITH PREVIOUSLY REPORTED AND CALLED VALUE.    Culture (A)  Final    ESCHERICHIA COLI SUSCEPTIBILITIES PERFORMED ON PREVIOUS CULTURE WITHIN THE LAST 5 DAYS. Performed at [facility], [place] [address] [place], [place] [address]    Report Status 03/13/2019 FINAL  Final  MRSA PCR Screening     Status: None   Collection Time: 03/11/19  3:47 AM  Result Value Ref Range  Status   MRSA by PCR NEGATIVE NEGATIVE Final    Comment:        The GeneXpert MRSA Assay (FDA approved for NASAL specimens only), is one component of a comprehensive MRSA colonization surveillance program. It is not intended to diagnose MRSA infection nor to guide or monitor treatment for MRSA infections. Performed at [facility], [place] [address], [place], [place] [address]   Aerobic/Anaerobic Culture (surgical/deep wound)     Status: None   Collection Time: 03/11/19  3:00 PM  Result Value Ref Range Status   Specimen Description ABSCESS DRAINAGE  Final   Special Requests MID UPPER ABDOMEN  Final   Gram Stain   Final    ABUNDANT WBC PRESENT,BOTH PMN AND MONONUCLEAR MODERATE GRAM VARIABLE ROD FEW GRAM NEGATIVE RODS Performed at [facility], [place] [address] [place], [place] [address]    Culture   Final    MODERATE PROTEUS MIRABILIS FEW ENTEROCOCCUS FAECALIS MIXED ANAEROBIC FLORA PRESENT.  CALL LAB IF FURTHER IID REQUIRED.    Report Status 03/16/2019 FINAL  Final   Organism ID, Bacteria PROTEUS MIRABILIS  Final   Organism ID, Bacteria ENTEROCOCCUS FAECALIS  Final      Susceptibility   Enterococcus faecalis - MIC*    AMPICILLIN <=  2 SENSITIVE Sensitive     VANCOMYCIN 1 SENSITIVE Sensitive     GENTAMICIN SYNERGY SENSITIVE Sensitive     * FEW ENTEROCOCCUS FAECALIS   Proteus mirabilis - MIC*    AMPICILLIN <=2 SENSITIVE Sensitive     CEFAZOLIN <=4 SENSITIVE Sensitive     CEFEPIME <=1 SENSITIVE Sensitive     CEFTAZIDIME <=1 SENSITIVE Sensitive     CEFTRIAXONE <=1 SENSITIVE Sensitive     CIPROFLOXACIN <=0.25 SENSITIVE Sensitive     GENTAMICIN <=1 SENSITIVE Sensitive     IMIPENEM 2 SENSITIVE Sensitive     TRIMETH/SULFA <=20 SENSITIVE Sensitive     AMPICILLIN/SULBACTAM <=2 SENSITIVE Sensitive     PIP/TAZO <=4 SENSITIVE Sensitive     * MODERATE PROTEUS MIRABILIS  Aerobic/Anaerobic Culture (surgical/deep wound)     Status: None   Collection Time: 03/11/19  3:00 PM   Result Value Ref Range Status   Specimen Description ABSCESS DRAINAGE  Final   Special Requests LEFT LOWER ABDOMEN  Final   Gram Stain   Final    ABUNDANT WBC PRESENT,BOTH PMN AND MONONUCLEAR RARE GRAM POSITIVE COCCI FEW GRAM NEGATIVE RODS FEW GRAM VARIABLE ROD Performed at [facility], [address], [place], [place] [address]    Culture   Final    MODERATE PROTEUS MIRABILIS MIXED ANAEROBIC FLORA PRESENT.  CALL LAB IF FURTHER IID REQUIRED.    Report Status 03/16/2019 FINAL  Final   Organism ID, Bacteria PROTEUS MIRABILIS  Final      Susceptibility   Proteus mirabilis - MIC*    AMPICILLIN <=2 SENSITIVE Sensitive     CEFAZOLIN <=4 SENSITIVE Sensitive     CEFEPIME <=1 SENSITIVE Sensitive     CEFTAZIDIME <=1 SENSITIVE Sensitive     CEFTRIAXONE <=1 SENSITIVE Sensitive     CIPROFLOXACIN <=0.25 SENSITIVE Sensitive     GENTAMICIN <=1 SENSITIVE Sensitive     IMIPENEM 2 SENSITIVE Sensitive     TRIMETH/SULFA <=20 SENSITIVE Sensitive     AMPICILLIN/SULBACTAM <=2 SENSITIVE Sensitive     PIP/TAZO <=4 SENSITIVE Sensitive     * MODERATE PROTEUS MIRABILIS  Novel Coronavirus, NAA (hospital order; send-out to ref lab)     Status: None   Collection Time: 03/14/19  3:35 PM  Result Value Ref Range Status   SARS-CoV-2, NAA NOT DETECTED NOT DETECTED Final    Comment: (NOTE) This test was developed and its performance characteristics determined by [employer]. This test has not been FDA cleared or approved. This test has been authorized by FDA under an Emergency Use Authorization (EUA). This test is only authorized for the duration of time the declaration that circumstances exist justifying the authorization of the emergency use of in vitro diagnostic tests for detection of SARS-CoV-2 virus and/or diagnosis of COVID-19 infection under section 564(b)(1) of the Act, 21 U.S.C. 902IOX-7(D)(5), unless the authorization is terminated or revoked sooner. When diagnostic testing is  negative, the possibility of a false negative result should be considered in the context of a patient's recent exposures and the presence of clinical signs and symptoms consistent with COVID-19. An individual without symptoms of COVID-19 and who is not shedding SARS-CoV-2 virus would expect to have a negative (not detected) result in this assay. Performed  At: [facility] [address] [place], [place] [id number] Rush Farmer MD TM:[id number]    [place]  Final    Comment: Performed at [facility], [place] [address] [place], [place] [address]  Radiology Studies: Dg Chest Port 1 View  Result Date: 03/17/2019 CLINICAL DATA:  73 year old female with shortness of breath EXAM: PORTABLE CHEST 1 VIEW COMPARISON:  CT 03/14/2019, chest x-ray 03/13/2019 FINDINGS: Cardiomediastinal silhouette unchanged in size and contour. Right IJ port catheter with the tip appearing to terminate superior vena cava, unchanged. Low lung volumes with crowded interstitium. Similar appearance of opacity at the left lung base with blunting of left costophrenic angle and obscuration of left hemidiaphragm. No interlobular septal thickening. IMPRESSION: Persisting left basilar opacity likely combination of small pleural effusion and associated atelectasis/consolidation. Unchanged right port catheter. Electronically Signed   By: Corrie Mckusick D.O.   On: 03/17/2019 08:01      Scheduled Meds: . apixaban  5 mg Oral BID  . diltiazem  240 mg Oral Daily  . feeding supplement (GLUCERNA SHAKE)  237 mL Oral TID BM  . feeding supplement (PRO-STAT SUGAR FREE 64)  30 mL Oral BID  . furosemide  40 mg Oral Daily  . insulin aspart  0-15 Units Subcutaneous TID WC  . insulin aspart  6 Units Subcutaneous TID WC  . insulin glargine  12 Units Subcutaneous Daily  . metoprolol tartrate  12.5 mg Oral BID  . multivitamin with minerals  1 tablet Oral Daily  . pantoprazole (PROTONIX) IV  40  mg Intravenous QHS  . sertraline  125 mg Oral Daily  . simvastatin  20 mg Oral q1800  . sodium chloride flush  5 mL Intracatheter Q8H   Continuous Infusions: . sodium chloride 75 mL/hr at 03/13/19 0500  . ceFEPime (MAXIPIME) IV 2 g (03/18/19 0959)  . metronidazole 500 mg (03/18/19 0836)     LOS: 7 days      Debbe Odea, MD [facility] Pager: [email or web address] Password [email or web address] 03/18/2019, 12:44 PM

## 2019-03-18 NOTE — Progress Notes (Signed)
Occupational Therapy Treatment Patient Details Name: Cassandra Allen MRN: [id number] DOB: Sep 09, 1946 Today's Date: 03/18/2019    History of present illness 73 yo female recently dx with colon cancer with mets.  Had surgery.  Post op complicated by abscess.  Had drain by IR.  Grew Enterococcus.  D/c home on ABx and had drain removed 4/22.  She was noted to have enlarging abscess.  Developed abdominal pain, nausea, vomiting.  In ER had A fib with RVR.  Started on cardizem.  Became hypotensive.    OT comments  Pt progressing towards OT goals this session. Able to demonstrate increased actvity tolerance fro BSC at mod A, able to perform perianal care with set up (max A for management of underewear) and standing activity tolerance at sink at min guard (required 1 seated rest break). Pt will benefit from continued skilled OT services and SNF remains appropriate for dc to SNF.    Follow Up Recommendations  SNF;Supervision/Assistance - 24 hour    Equipment Recommendations  3 in 1 bedside commode    Recommendations for Other Services      Precautions / Restrictions Precautions Precautions: Fall Restrictions Weight Bearing Restrictions: No       Mobility Bed Mobility Overal bed mobility: Needs Assistance Bed Mobility: Supine to Sit     Supine to sit: Min guard     General bed mobility comments: No assist needed to come to eOB  Transfers Overall transfer level: Needs assistance Equipment used: Rolling walker (2 wheeled) Transfers: Sit to/from Stand Sit to Stand: Mod assist Stand pivot transfers: (mod for lower surfaces)       General transfer comment: cueing for hand placement and safety, mod assist to power up, for safety and balance. Pt stands with incr weight on heels and narrow BOS.      Balance Overall balance assessment: Needs assistance Sitting-balance support: Feet unsupported;No upper extremity supported Sitting balance-Leahy Scale: Fair     Standing balance  support: Bilateral upper extremity supported Standing balance-Leahy Scale: Poor Standing balance comment: reliant on BUE support in standing as well as +2 external support                           ADL either performed or assessed with clinical judgement   ADL Overall ADL's : Needs assistance/impaired     Grooming: Wash/dry hands;Wash/dry face;Min guard;Standing Grooming Details (indicate cue type and reason): fatigues at sinki             Lower Body Dressing: Maximal assistance;Sit to/from stand Lower Body Dressing Details (indicate cue type and reason): to don underwear Toilet Transfer: Moderate assistance;Ambulation;RW Toilet Transfer Details (indicate cue type and reason): BSC Toileting- Clothing Manipulation and Hygiene: Min guard;Sit to/from stand Toileting - Clothing Manipulation Details (indicate cue type and reason): able to perform own peri care with warm wash cloth in standing, required assist to feed feet through underwear     Functional mobility during ADLs: Minimal assistance;Rolling walker General ADL Comments: Pt complaining about L knee pain, generalized deconditioning     Vision       Perception     Praxis      Cognition Arousal/Alertness: Awake/alert Behavior During Therapy: WFL for tasks assessed/performed Overall Cognitive Status: Within Functional Limits for tasks assessed                                 General  Comments: appears [facility] for basic tasks         Exercises     Shoulder Instructions       General Comments VSS during session    Pertinent Vitals/ Pain       Pain Assessment: No/denies pain  Home Living                                          Prior Functioning/Environment              Frequency  Min 2X/week        Progress Toward Goals  OT Goals(current goals can now be found in the care plan section)  Progress towards OT goals: Progressing toward goals  Acute Rehab  OT Goals Patient Stated Goal: go back to SNF OT Goal Formulation: With patient Time For Goal Achievement: 03/30/19 Potential to Achieve Goals: Good  Plan Discharge plan remains appropriate;Frequency remains appropriate    Co-evaluation    PT/OT/SLP Co-Evaluation/Treatment: Yes Reason for Co-Treatment: Complexity of the patient's impairments (multi-system involvement);For patient/therapist safety PT goals addressed during session: Mobility/safety with mobility;Proper use of DME OT goals addressed during session: ADL's and self-care;Proper use of Adaptive equipment and DME      AM-PAC OT "6 Clicks" Daily Activity     Outcome Measure   Help from another person eating meals?: A Little Help from another person taking care of personal grooming?: A Little Help from another person toileting, which includes using toliet, bedpan, or urinal?: A Lot Help from another person bathing (including washing, rinsing, drying)?: A Lot Help from another person to put on and taking off regular upper body clothing?: A Lot Help from another person to put on and taking off regular lower body clothing?: A Lot 6 Click Score: 14    End of Session Equipment Utilized During Treatment: Gait belt;Rolling walker  OT Visit Diagnosis: Other abnormalities of gait and mobility (R26.89);Muscle weakness (generalized) (M62.81)   Activity Tolerance Patient tolerated treatment well   Patient Left in chair;with call bell/phone within reach;with nursing/sitter in room   Nurse Communication Mobility status        Time: 2409-7353 OT Time Calculation (min): 34 min  Charges: OT General Charges $OT Visit: 1 Visit OT Treatments $Self Care/Home Management : 8-22 mins  Hulda Humphrey OTR/L Acute Rehabilitation Services Pager: [phone] Office: [place] 03/18/2019, 2:15 PM

## 2019-03-18 NOTE — Progress Notes (Addendum)
Physical Therapy Treatment Patient Details Name: Cassandra Allen MRN: [id number] DOB: 1946-05-20 Today's Date: 03/18/2019    History of Present Illness 73 yo female recently dx with colon cancer with mets.  Had surgery.  Post op complicated by abscess.  Had drain by IR.  Grew Enterococcus.  D/c home on ABx and had drain removed 4/22.  She was noted to have enlarging abscess.  Developed abdominal pain, nausea, vomiting.  In ER had A fib with RVR.  Started on cardizem.  Became hypotensive.     PT Comments    Pt admitted with above diagnosis. Pt currently with functional limitations due to balance and endurance deficits. Pt was able to ambulate a short distance with pain in left knee hindering incr progression.  Needed +2 min to mod assist with RW.  Will continue to follow acutely.  Still recommend SNF for incr [place] once home.  Pt mentioned she is going home with her children.  If she has 24 hour care,that would be ok with HHPT and HHOT.  May also need HHAide.  She stated her son has bought a wheelchair and a 3N1 as well as a walker. Pt will benefit from skilled PT to increase their independence and safety with mobility to allow discharge to the venue listed below.     Follow Up Recommendations  SNF     Equipment Recommendations  None recommended by PT    Recommendations for Other Services OT consult     Precautions / Restrictions Precautions Precautions: Fall Restrictions Weight Bearing Restrictions: No    Mobility  Bed Mobility Overal bed mobility: Needs Assistance Bed Mobility: Supine to Sit     Supine to sit: Min guard     General bed mobility comments: No assist needed to come to eOB  Transfers Overall transfer level: Needs assistance Equipment used: Rolling walker (2 wheeled) Transfers: Sit to/from Stand Sit to Stand: Mod assist Stand pivot transfers: (mod for lower surfaces)       General transfer comment: cueing for hand placement and safety, mod  assist to power up, for safety and balance. Pt stands with incr weight on heels and narrow BOS.    Ambulation/Gait Ambulation/Gait assistance: Mod assist;Min assist;+2 physical assistance Gait Distance (Feet): 15 Feet(12 feet then 3 feet) Assistive device: Rolling walker (2 wheeled) Gait Pattern/deviations: Step-through pattern;Decreased step length - right;Decreased step length - left;Decreased stride length;Trunk flexed;Narrow base of support;Leaning posteriorly   Gait velocity interpretation: <1.31 ft/sec, indicative of household ambulator General Gait Details: Steps were effortful, short and painful per pt.  She was flexed of posture and had to be supported and the RW maneuvered.  Pt reports left knee pain limits her greatly.  She walked almost to door.  Rested then walked 3 ffet to sink and brushed her teeth.  then she needed chair brought to her because she couldnt back up to chair.     Stairs             Wheelchair Mobility    Modified Rankin (Stroke Patients Only)       Balance Overall balance assessment: Needs assistance Sitting-balance support: Feet unsupported;No upper extremity supported Sitting balance-Leahy Scale: Fair     Standing balance support: Bilateral upper extremity supported Standing balance-Leahy Scale: Poor Standing balance comment: reliant on BUE support in standing as well as +2 external support  Cognition Arousal/Alertness: Awake/alert Behavior During Therapy: WFL for tasks assessed/performed Overall Cognitive Status: Within Functional Limits for tasks assessed                                 General Comments: appears WFL for basic tasks       Exercises General Exercises - Lower Extremity Long Arc Quad: AROM;Both;10 reps;Seated    General Comments General comments (skin integrity, edema, etc.): VSS during session      Pertinent Vitals/Pain Pain Assessment: No/denies pain    Home  Living                      Prior Function            PT Goals (current goals can now be found in the care plan section) Acute Rehab PT Goals Patient Stated Goal: go back to SNF Progress towards PT goals: Progressing toward goals    Frequency    Min 3X/week      PT Plan Current plan remains appropriate    Co-evaluation PT/OT/SLP Co-Evaluation/Treatment: Yes Reason for Co-Treatment: Complexity of the patient's impairments (multi-system involvement);For patient/therapist safety PT goals addressed during session: Mobility/safety with mobility        AM-PAC PT "6 Clicks" Mobility   Outcome Measure  Help needed turning from your back to your side while in a flat bed without using bedrails?: None Help needed moving from lying on your back to sitting on the side of a flat bed without using bedrails?: None Help needed moving to and from a bed to a chair (including a wheelchair)?: A Lot Help needed standing up from a chair using your arms (e.g., wheelchair or bedside chair)?: A Lot Help needed to walk in hospital room?: A Lot Help needed climbing 3-5 steps with a railing? : Total 6 Click Score: 15    End of Session Equipment Utilized During Treatment: Gait belt Activity Tolerance: Patient limited by fatigue;Patient limited by pain Patient left: in chair;with call bell/phone within reach Nurse Communication: Mobility status PT Visit Diagnosis: Unsteadiness on feet (R26.81);Muscle weakness (generalized) (M62.81)     Time: 9833-8250 PT Time Calculation (min) (ACUTE ONLY): 33 min  Charges:  $Gait Training: 8-22 mins                     [place] Pager:  [phone]  Office:  [place] 03/18/2019, 11:08 AM

## 2019-03-19 LAB — GLUCOSE, CAPILLARY
Glucose-Capillary: 228 mg/dL — ABNORMAL HIGH (ref 70–99)
Glucose-Capillary: 372 mg/dL — ABNORMAL HIGH (ref 70–99)
Glucose-Capillary: 327 mg/dL — ABNORMAL HIGH (ref 70–99)
Glucose-Capillary: 143 mg/dL — ABNORMAL HIGH (ref 70–99)

## 2019-03-19 MED ORDER — INSULIN ASPART 100 UNIT/ML ~~LOC~~ SOLN
0.0000 [IU] | Freq: Three times a day (TID) | SUBCUTANEOUS | Status: DC
  Administered 2019-03-19 (×2): 11 [IU] via SUBCUTANEOUS
  Administered 2019-03-20: 07:00:00 3 [IU] via SUBCUTANEOUS
  Administered 2019-03-20: 13:00:00 11 [IU] via SUBCUTANEOUS

## 2019-03-19 MED ORDER — INSULIN ASPART 100 UNIT/ML ~~LOC~~ SOLN
7.0000 [IU] | Freq: Three times a day (TID) | SUBCUTANEOUS | Status: AC
  Administered 2019-03-19 (×2): 7 [IU] via SUBCUTANEOUS

## 2019-03-19 MED ORDER — SODIUM CHLORIDE 0.9 % IJ SOLN: 10.0000 mL | Freq: Every day | INTRAMUSCULAR | 1 refills | Status: DC

## 2019-03-19 MED ORDER — INSULIN ASPART 100 UNIT/ML ~~LOC~~ SOLN: 0.0000 [IU] | Freq: Every day | SUBCUTANEOUS | Status: DC

## 2019-03-19 MED ORDER — INSULIN GLARGINE 100 UNIT/ML ~~LOC~~ SOLN
4.0000 [IU] | Freq: Once | SUBCUTANEOUS | Status: AC
  Administered 2019-03-19: 13:00:00 4 [IU] via SUBCUTANEOUS
  Filled 2019-03-19 (×2): qty 0.04

## 2019-03-19 MED ORDER — INSULIN DETEMIR 100 UNIT/ML ~~LOC~~ SOLN
16.0000 [IU] | Freq: Every day | SUBCUTANEOUS | Status: DC
  Administered 2019-03-20: 09:00:00 16 [IU] via SUBCUTANEOUS
  Filled 2019-03-19: qty 0.16

## 2019-03-19 NOTE — Consult Note (Signed)
   [facility] CM Inpatient Consult   03/19/2019  Matilynn Dacey [place] [facility] 11/29/1945 [id number]  Referral received for post hospital follow up for possible resources for supplies for colostomy.  Patient had been at [facility] and current plan is to return home with home health.  Request is for follow up to assist with supplies of colostomy, if able.    Spoke with patient via telephone in patient room, HIPAA verified.  Patient gave verbal permission for [place] Management for community follow up.  Will follow for disposition and needs as appropriate.  Thanks for the referral.  [place] Management does not interfere with or replace any services arranged by the inpatient [facility] staff for patient's needs for disposition.  For questions, please contact:  Natividad Brood, RN BSN [facility] Liaison  [phone] business mobile phone Toll free office [phone]  Fax number: [phone] [email or web address]  [email or web address]

## 2019-03-19 NOTE — Progress Notes (Signed)
Pharmacy Antibiotic Note  Cassandra Allen is a 73 y.o. female intraabdominal abscess growing proteus mirabilis and enterococcus and she is also noted with Ecoli bactermia.  Pharmacy dosing cefepime (day 10; patient also on amoxicillin; Ecoli is resistant to ampicillin). Perc drain placed by IR on 4/28. -CrCl ~ 50   Plan: -Continue cefepime 2g IV q12h -Will follow renal function and clinical progress    Height: 5\' 2"  (157.5 cm) Weight: 141 lb 15.6 oz (64.4 kg) IBW/kg (Calculated) : 50.1  Temp (24hrs), Avg:98.7 F (37.1 C), Min:98.4 F (36.9 C), Max:99.2 F (37.3 C)  Recent Labs  Lab 03/14/19 0350 03/15/19 0500 03/16/19 0528 03/17/19 0430 03/18/19 0753  WBC 11.4* 8.4 7.9 11.3* 9.0  CREATININE 0.74 0.81 0.67 0.95 0.84    Estimated Creatinine Clearance: 53.3 mL/min (by C-G formula based on SCr of 0.84 mg/dL).    Allergies  Allergen Reactions  . Metformin And Related Nausea And Vomiting  . Prednisone Nausea And Vomiting   Antimicrobials: Cefepime 4/27>> Flagyl 4/27>> 5/5 Amox 5/5>>  Microbiology: 4/27 BCx: E coli (R-amp/unasyn) 4/28 MRSA: neg 4/28 Surgical/deep wound: proteus (panS), enterococcus   Hildred Laser, PharmD Clinical Pharmacist **Pharmacist phone directory can now be found on [email or web address] ([email or web address]).  Listed under [place].

## 2019-03-19 NOTE — TOC Progression Note (Addendum)
Transition of Care [facility]) - Progression Note    Patient Details  Name: PHIONA RAMNAUTH MRN: [id number] Date of Birth: May 08, 1946  [facility] of [facility]) CM/SW Contact  Candie Chroman, LCSW Phone Number: 03/19/2019, 10:20 AM  Clinical Narrative: 3-in-1 has been delivered. Patient and son asking questions about ordering colostomy supplies and if insurance covers it. She was at [facility] when she first got it. Spoke to [facility] nurse that saw her last week. She is at [facility] today but asked that we put in [facility] consult. RN will enter and put in comments that she needs to discuss ordering supplies for home. Son requested that he be notified at least the day before discharge so he can take off work in order to pick her up. Paged MD to notify.  11:52 am: Per MD, patient will discharge home tomorrow. Son is aware and agreeable.  Expected Discharge Plan: [place] Barriers to Discharge: Continued Medical Work up, Ship broker  Expected Discharge Plan and Services Expected Discharge Plan: [place]: [place] arrangements for the past 2 months: [place]                                       Social Determinants of Health (SDOH) Interventions    Readmission Risk Interventions Readmission Risk Prevention Plan 03/13/2019  Transportation Screening Complete  Medication Review Press photographer) Complete  HRI or [place] Not Complete  HRI or Home Care Consult Pt Refusal Comments Plan for discharge to SNF.  SW Recovery Care/Counseling Consult Complete  Palliative Care Screening Not Applicable  Skilled Nursing Facility Complete  Some recent data might be hidden

## 2019-03-19 NOTE — Progress Notes (Signed)
IR rounding note via telephone per new regulations. Spoke with Arbie Cookey, RN.  Patient with history of metastatic colon cancer (s/p partial colectomy and end colostomy) with secondary postoperative abscess s/p mid upper abdomen drain (labeled 1) and LLQ drain (labeled 2) placement 03/11/2019 by Dr. Anselm Pancoast.  Drain 1 (mid upper abdominal drain)site c/d/iwith approximately 10 cc of clear yellow/pale fluid in JP bulb. Drain 2 (LLQ abdominal drain)site c/d/iwith approximately 5 cc output of clear yellow in JP bulb. Total output of both drains in past 24 hours = 35 cc. RN reports both drains flush/aspirate without resistance.  Continue current drain management- continue with Qshift flushes/monitor of output. Appreciate and agree with [facility] and [facility] management. IR to follow.   Bea Graff Williams Dietrick, PA-C 03/19/2019, 8:04 AM

## 2019-03-19 NOTE — Progress Notes (Signed)
Physical Therapy Treatment Patient Details Name: Cassandra Allen MRN: [id number] DOB: November 24, 1945 Today's Date: 03/19/2019    History of Present Illness 73 yo female recently dx with colon cancer with mets.  Had surgery.  Post op complicated by abscess.  Had drain by IR.  Grew Enterococcus.  D/c home on ABx and had drain removed 4/22.  She was noted to have enlarging abscess.  Developed abdominal pain, nausea, vomiting.  In ER had A fib with RVR.  Started on cardizem.  Became hypotensive.     PT Comments    Pt admitted with above diagnosis. Pt currently with functional limitations due to balance and endurance deficits. Pt did better today with ambulation.  Once up she is able to walk with RW with min guard assist.  Needs mod assist to stand. She states daughter in law will assist her at home and they have gotten all the equipment.  Will need HHPT, [place], HHaide.   Pt will benefit from skilled PT to increase their independence and safety with mobility to allow discharge to the venue listed below.     Follow Up Recommendations  Home health PT;Supervision/Assistance - 24 hour(HHOT HHaide)     Equipment Recommendations  None recommended by PT    Recommendations for Other Services OT consult     Precautions / Restrictions Precautions Precautions: Fall Restrictions Weight Bearing Restrictions: No    Mobility  Bed Mobility               General bed mobility comments: in chair on arrival  Transfers Overall transfer level: Needs assistance Equipment used: Rolling walker (2 wheeled) Transfers: Sit to/from Stand Sit to Stand: Mod assist Stand pivot transfers: (mod for lower surfaces)       General transfer comment: cueing for hand placement and safety, mod assist to power up, for safety and balance. Pt stands with incr weight on heels and narrow BOS.  Takes incr time to get her balance  Ambulation/Gait Ambulation/Gait assistance: Min Web designer (Feet): 35  Feet Assistive device: Rolling walker (2 wheeled) Gait Pattern/deviations: Step-through pattern;Decreased step length - right;Decreased step length - left;Decreased stride length;Trunk flexed;Narrow base of support;Leaning posteriorly   Gait velocity interpretation: <1.31 ft/sec, indicative of household ambulator General Gait Details: Steps were effortful, short and painful per pt.  Less pain today per pt in left knee therefore she progressed distance and was able to support herself with RW with less physical asssit. Did follwo with chair.    Stairs             Wheelchair Mobility    Modified Rankin (Stroke Patients Only)       Balance Overall balance assessment: Needs assistance Sitting-balance support: Feet unsupported;No upper extremity supported Sitting balance-Leahy Scale: Fair     Standing balance support: Bilateral upper extremity supported Standing balance-Leahy Scale: Poor Standing balance comment: reliant on BUE support in standing as well as +1 external support                            Cognition Arousal/Alertness: Awake/alert Behavior During Therapy: WFL for tasks assessed/performed Overall Cognitive Status: Within Functional Limits for tasks assessed                                 General Comments: appears [facility] for basic tasks       Exercises General Exercises - Lower  Extremity Long Arc Quad: AROM;Both;10 reps;Seated    General Comments General comments (skin integrity, edema, etc.): VSS during session      Pertinent Vitals/Pain Pain Assessment: No/denies pain    Home Living                      Prior Function            PT Goals (current goals can now be found in the care plan section) Acute Rehab PT Goals Patient Stated Goal: go home Progress towards PT goals: Progressing toward goals    Frequency    Min 3X/week      PT Plan Discharge plan needs to be updated    Co-evaluation               AM-PAC PT "6 Clicks" Mobility   Outcome Measure  Help needed turning from your back to your side while in a flat bed without using bedrails?: None Help needed moving from lying on your back to sitting on the side of a flat bed without using bedrails?: None Help needed moving to and from a bed to a chair (including a wheelchair)?: A Lot Help needed standing up from a chair using your arms (e.g., wheelchair or bedside chair)?: A Lot Help needed to walk in hospital room?: A Little Help needed climbing 3-5 steps with a railing? : A Lot 6 Click Score: 17    End of Session Equipment Utilized During Treatment: Gait belt Activity Tolerance: Patient limited by fatigue;Patient limited by pain Patient left: in chair;with call bell/phone within reach Nurse Communication: Mobility status PT Visit Diagnosis: Unsteadiness on feet (R26.81);Muscle weakness (generalized) (M62.81)     Time: 0539-7673 PT Time Calculation (min) (ACUTE ONLY): 21 min  Charges:  $Gait Training: 8-22 mins                     [place] Pager:  [phone]  Office:  [place] 03/19/2019, 1:36 PM

## 2019-03-19 NOTE — Progress Notes (Signed)
Inpatient Diabetes Program Recommendations  AACE/ADA: New Consensus Statement on Inpatient Glycemic Control (2015)  Target Ranges:  Prepandial:   less than 140 mg/dL      Peak postprandial:   less than 180 mg/dL (1-2 hours)      Critically ill patients:  140 - 180 mg/dL   Lab Results  Component Value Date   GLUCAP 228 (H) 03/19/2019   HGBA1C 6.8 (H) 03/11/2019    Review of Glycemic Control Results for Cassandra Allen, Cassandra Allen (MRN [id number]) as of 03/19/2019 09:48  Ref. Range 03/18/2019 06:35 03/18/2019 10:41 03/18/2019 16:50 03/18/2019 21:44 03/19/2019 06:20  Glucose-Capillary Latest Ref Range: 70 - 99 mg/dL 203 (H) 244 (H) 231 (H) 305 (H) 228 (H)   Diabetes history: DM2 Outpatient Diabetes medications: NPH 12 units bid Current orders for Inpatient glycemic control: Lantus 12 units + Novolog 6 units tid meal coverage + Novolog moderate correction tid  Inpatient Diabetes Program Recommendations:   Noted patient did not receive Novolog correction pm doses 5/5. -Increase Lantus to 16 units qd -Increase Novolog meal coverage to 7 units tid meal coverage -Add Novolog hs correction 0-5 units  Thank you, Bethena Roys E. Schelly Chuba, RN, MSN, CDE  Diabetes Coordinator Inpatient Glycemic Control Team Team Pager [phone] (8am-5pm) 03/19/2019 9:53 AM

## 2019-03-19 NOTE — Progress Notes (Addendum)
PROGRESS NOTE    Cassandra Allen   BSJ:[id number]  DOB: 1945/11/29  DOA: 03/10/2019 PCP: [name], MD   Brief Narrative:  Cassandra Allen 73 y.o.WF PMHx HTN, HLD, diabetes type 2/IDDM controlled with complication, chronic diastolic CHF, atrial fibrillation on Eliquis, metastatic colon cancer (s/p ofpartial colectomy, chemotherapy),s/p of colostomy,CKD stage III, depression had surgery which was complicated by intra-abdominal abscess, drain placed by IR grew enterococcus, discharged on antibiotics and drain removed 4/22, now presented from SNF with abdominal pain, nausea and vomiting. Found to have a temp of 100.7, WBC 12.7, A-fib with RVR and HR in 170s.  Admitted for enlarging intra-abdominal abscess.  IR consulted and 2 drains placed.  Ongoing antibiotics.  Clinically improved.  Potential discharge home 5/7.    Subjective: Patient reported this morning that the quantity of breakfast was not sufficient and hence ordered a second tray of pancakes.  Had some abdominal pain after eating but that has since resolved.  Asking if she can have regular diet despite being a diabetic.  States that she was at [facility] because of left lower extremity weakness and was getting rehab for same and it has improved but not completely resolved.    Assessment & Plan:   Principal Problem:   Abscesses of abdominal cavity - severe sepsis - Eliquis had been held for drainage, now resumed. - 4/29> IR guided drainage with 2 drains placed-1 in the upper mid abdomen and the second 1 in left lower quadrant. - receiving Cefepime (day 10) and Flagyl (completed 9 days and discontinued on 5/5) - -Mid upper abdomen drain culture: Proteus mirabilis and Enterococcus faecalis - LLQ drain CX -  Proteus mirabilis - IR feels that she will need to go home with drains.  I discussed with IR MD on call 5/6 and confirmed that patient will be going home with drains, they will arrange outpatient follow-up and drain  clinic and drain management instructions. -I discussed with ID MD on call who recommended additional 2 weeks of amoxicillin (for Proteus and enterococcus in abscess) and Keflex (for E. coli bacteremia) at discharge.  Continue IV cefepime for today and transition to oral Keflex in a.m. -Improved.  Active Problems: Bacteremia with E coli - blood culture from 4/27 > E coli sensitive to cefepime, Cefazolin, Cipro.  -Cefepime day 10 today.  -As discussed with ID MD on call on 5/6, recommends Keflex for this indication at discharge for additional 2 weeks.  Acute on chronic d CHF - with left > right pleural effusion on CR on 5/1 - has been treated with IV Lasix-  -CXR repeated-  reveals minimal pleural effusion now - wt 160 >142 pounds since admission. --10.6 L since admission - Clinically euvolemic/compensated.  Transitioned to oral Lasix 5/5. - started incentive spirometry  A-fib with RVR on admission -Apixaban was temporarily held and patient was placed briefly on IV heparin prior to drain placement. - 5/4> d/c Heparin and resumed Apixaban -cont Cardizem and Lopressor -Controlled ventricular rate.  Hypokalemia -Replaced.  Protein calorie malnutrition with weight loss - add Ensure and Dietician consult from 5/5 appreciated.  Continue management per recommendations.  DM2/IDDM -Of note prior to multiple recent hospitalizations, patient was on long-term Levemir and Victoza.  Somewhere during the course of hospitalization and SNF admission, she was switched to NPH.  She indicates that she still has substantial amount of Levemir at home. -Adjusted Lantus, NovoLog mealtime and SSI for today.  However from tomorrow transition to [place] at discharge  with close outpatient follow-up with PCP. -Lantus was increased from 12units to 16 units for today.  NovoLog mealtime increased from 6units to 7 units.  Added bedtime SSI.    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 DVT prophylaxis: Apixaban Code Status: Discussed in detail with patient's son, updated care and answered questions. Family Communication: PT and OT recommend SNF.  However patient and family/son indicated that they are unable to afford the out-of-pocket payment as confirmed by clinical social work.  Hence plan is for discharge home with home health services.  Also son needs to be informed the day prior to discharge so he can take time off from work.  Potential discharge home 5/7. Disposition Plan: Potential DC home 5/7. Consultants:   IR  Gen surgery Procedures:   IR drains Antimicrobials:  Anti-infectives (From admission, onward)   Start     Dose/Rate Route Frequency Ordered Stop   03/18/19 1700  amoxicillin (AMOXIL) capsule 500 mg     500 mg Oral Every 8 hours 03/18/19 1555     03/11/19 1000  ceFEPIme (MAXIPIME) 2 g in sodium chloride 0.9 % 100 mL IVPB     2 g 200 mL/hr over 30 Minutes Intravenous Every 12 hours 03/10/19 2353     03/11/19 0900  metroNIDAZOLE (FLAGYL) IVPB 500 mg  Status:  Discontinued     500 mg 100 mL/hr over 60 Minutes Intravenous Every 8 hours 03/11/19 0101 03/18/19 1258   03/10/19 2100  ceFEPIme (MAXIPIME) 2 g in sodium chloride 0.9 % 100 mL IVPB     2 g 200 mL/hr over 30 Minutes Intravenous  Once 03/10/19 2058 03/10/19 2219   03/10/19 2100  metroNIDAZOLE (FLAGYL) IVPB 500 mg     500 mg 100 mL/hr over 60 Minutes Intravenous  Once 03/10/19 2058 03/11/19 0114       Objective: Vitals:   03/19/19 0303 03/19/19 0725 03/19/19 0748 03/19/19 1057  BP: 134/79 (!)  139/91 133/65 138/80  Pulse: 96  97 93  Resp: 18 20 20 18   Temp: 98.4 F (36.9 C)  98.5 F (36.9 C) 98.2 F (36.8 C)  TempSrc: Oral  Axillary Oral  SpO2: 92%  98% 96%  Weight: 64.4 kg     Height:        Intake/Output Summary (Last 24 hours) at 03/19/2019 1150 Last data filed at 03/19/2019 0900 Gross per 24 hour  Intake 1013.22 ml  Output 1147.5 ml  Net -134.28 ml   Filed Weights   03/17/19 0405 03/18/19 0328 03/19/19 0303  Weight: 73.7 kg 76.6 kg 64.4 kg    Examination: General exam:  Pleasant elderly female, moderately built and nourished sitting up comfortably in bed. Respiratory system: Clear to auscultation.  No increased work of breathing. Cardiovascular system:  S1 and S2 heard, irregularly irregular.  No JVD, murmurs or pedal edema. Gastrointestinal system:  Abdomen is nondistended, soft and nontender.  Normal bowel sounds heard.  To percutaneous abdominal drains present 1 in mid upper abdomen and one in left lower quadrant.  Colostomy intact without acute findings. Central nervous system: Alert and oriented. No focal neurological deficits. Extremities: No cyanosis, clubbing or edema.  All extremities grade 5 x 5 power except left lower extremity with grade 4+ by 5 power. Skin: No rashes or ulcers Psychiatry:  Mood & affect appropriate.     Data Reviewed: I have personally reviewed following labs and imaging studies  CBC: Recent Labs  Lab 03/14/19 0350 03/15/19 0500 03/16/19 0528 03/17/19 0430 03/18/19 0753  WBC 11.4* 8.4 7.9 11.3* 9.0  HGB 9.7* 8.7* 9.6* 9.9* 10.3*  HCT 35.4* 31.3* 34.1* 34.3* 35.5*  MCV 95.9 94.8 93.4 92.7 93.4  PLT 350 303 303 295 539   Basic Metabolic Panel: Recent Labs  Lab 03/13/19 0550 03/14/19 0350 03/15/19 0500 03/16/19 0528 03/17/19 0430 03/18/19 0753  NA 139 139 142 143 141 137  K 3.8 3.5 2.8* 2.9* 3.3* 3.9  CL 107 108 104 102 99 96*  CO2 25 24 29  34* 34* 32  GLUCOSE 160* 241* 241* 113* 159* 190*  BUN 17 12 10 11 17 17     CREATININE 0.71 0.74 0.81 0.67 0.95 0.84  CALCIUM 7.9* 8.0* 8.2* 8.4* 8.5* 8.8*  MG 1.8 1.7 1.6* 1.6* 1.7  --   PHOS 3.5  --   --   --   --   --    GFR: Estimated Creatinine Clearance: 53.3 mL/min (by C-G formula based on SCr of 0.84 mg/dL). Liver Function Tests: Recent Labs  Lab 03/14/19 0350 03/16/19 0528  AST 12*  --   ALT 42  --   ALKPHOS 528*  --   BILITOT 1.0  --   PROT 5.3*  --   ALBUMIN 2.1* 2.8*   CBG: Recent Labs  Lab 03/18/19 1041 03/18/19 1650 03/18/19 2144 03/19/19 0620 03/19/19 1055  GLUCAP 244* 231* 305* 228* 372*   Urine analysis:    Component Value Date/Time   COLORURINE AMBER (A) 03/10/2019 0041   APPEARANCEUR CLEAR 03/10/2019 0041   LABSPEC 1.032 (H) 03/10/2019 0041   PHURINE 5.0 03/10/2019 0041   GLUCOSEU NEGATIVE 03/10/2019 0041   HGBUR SMALL (A) 03/10/2019 0041   BILIRUBINUR NEGATIVE 03/10/2019 0041   KETONESUR NEGATIVE 03/10/2019 0041   PROTEINUR 30 (A) 03/10/2019 0041   UROBILINOGEN 0.2 03/25/2015 2317   NITRITE POSITIVE (A) 03/10/2019 0041   LEUKOCYTESUR MODERATE (A) 03/10/2019 0041   Recent Results (from the past 240 hour(s))  Urine culture     Status: None   Collection Time: 03/10/19  8:41 PM  Result Value Ref Range Status   Specimen Description URINE, CATHETERIZED  Final   Special Requests NONE  Final   Culture   Final    NO GROWTH Performed at [facility], [place] [address] [place], [place] [address]    Report Status 03/13/2019 FINAL  Final  Blood Culture (routine x 2)     Status: Abnormal   Collection Time: 03/10/19  9:37 PM  Result Value Ref Range Status   Specimen Description BLOOD RIGHT HAND  Final   Special Requests   Final    BOTTLES DRAWN AEROBIC AND ANAEROBIC Blood Culture results may not be optimal due to an inadequate volume of blood received in culture bottles   Culture  Setup Time   Final    GRAM NEGATIVE RODS IN BOTH AEROBIC AND ANAEROBIC BOTTLES CRITICAL RESULT CALLED TO, READ BACK BY AND VERIFIED WITH:  Cristopher Estimable PharmD 11:10 03/11/19 ([email or web address]) Performed at [facility], [place]  [address], [place], [place] [address]    Culture ESCHERICHIA COLI (A)  Final   Report Status 03/13/2019 FINAL  Final   Organism ID, Bacteria ESCHERICHIA COLI  Final      Susceptibility   Escherichia coli - MIC*    AMPICILLIN >=32 RESISTANT Resistant     CEFAZOLIN <=4 SENSITIVE Sensitive     CEFEPIME <=1 SENSITIVE Sensitive     CEFTAZIDIME <=1 SENSITIVE Sensitive     CEFTRIAXONE <=1 SENSITIVE Sensitive     CIPROFLOXACIN <=0.25 SENSITIVE Sensitive     GENTAMICIN <=1 SENSITIVE Sensitive     IMIPENEM <=0.25 SENSITIVE Sensitive     TRIMETH/SULFA <=20 SENSITIVE Sensitive     AMPICILLIN/SULBACTAM >=32 RESISTANT Resistant     PIP/TAZO <=4 SENSITIVE Sensitive     Extended ESBL NEGATIVE Sensitive     * ESCHERICHIA COLI  Blood Culture ID Panel (Reflexed)     Status: Abnormal   Collection Time: 03/10/19  9:37 PM  Result Value Ref Range Status   Enterococcus species NOT DETECTED NOT DETECTED Final   Listeria monocytogenes NOT DETECTED NOT DETECTED Final   Staphylococcus species NOT DETECTED NOT DETECTED Final   Staphylococcus aureus (BCID) NOT DETECTED NOT DETECTED Final   Streptococcus species NOT DETECTED NOT DETECTED Final   Streptococcus agalactiae NOT DETECTED NOT DETECTED Final   Streptococcus pneumoniae NOT DETECTED NOT DETECTED Final   Streptococcus pyogenes NOT DETECTED NOT DETECTED Final   Acinetobacter baumannii NOT DETECTED NOT DETECTED Final   Enterobacteriaceae species DETECTED (A) NOT DETECTED Final    Comment: Enterobacteriaceae represent a large family of gram-negative bacteria, not a single organism. CRITICAL RESULT CALLED TO, READ BACK BY AND VERIFIED WITH: Cristopher Estimable PharmD 11:10 03/11/19 ([email or web address])    Enterobacter cloacae complex NOT DETECTED NOT DETECTED Final   Escherichia coli DETECTED (A) NOT DETECTED Final    Comment: CRITICAL RESULT CALLED TO, READ BACK BY AND VERIFIED WITH: Cristopher Estimable PharmD 11:10 03/11/19 ([email or web address])    Klebsiella oxytoca NOT DETECTED NOT DETECTED Final   Klebsiella pneumoniae NOT DETECTED NOT DETECTED Final   Proteus species NOT DETECTED NOT DETECTED Final   Serratia marcescens NOT DETECTED NOT DETECTED Final   Carbapenem resistance NOT DETECTED NOT DETECTED Final   Haemophilus influenzae NOT DETECTED NOT DETECTED Final   Neisseria meningitidis NOT DETECTED NOT DETECTED Final   Pseudomonas aeruginosa NOT DETECTED NOT DETECTED Final   Candida albicans NOT DETECTED NOT DETECTED Final   Candida glabrata NOT DETECTED NOT DETECTED Final   Candida krusei NOT DETECTED NOT DETECTED Final   Candida parapsilosis NOT DETECTED NOT DETECTED Final   Candida tropicalis NOT DETECTED NOT DETECTED Final    Comment: Performed at [name], [place] [address] [place], [place] [address]  Blood Culture (routine x 2)     Status: Abnormal   Collection Time: 03/10/19  9:50 PM  Result Value Ref Range Status   Specimen Description BLOOD LEFT ARM  Final   Special Requests   Final    BOTTLES DRAWN AEROBIC AND ANAEROBIC Blood Culture adequate volume   Culture  Setup Time   Final    GRAM NEGATIVE RODS IN BOTH AEROBIC AND ANAEROBIC BOTTLES CRITICAL VALUE NOTED.  VALUE IS CONSISTENT WITH PREVIOUSLY REPORTED AND CALLED VALUE.    Culture (A)  Final    ESCHERICHIA COLI SUSCEPTIBILITIES PERFORMED ON PREVIOUS CULTURE WITHIN THE LAST 5 DAYS. Performed at [facility], [place] [address], [place], [place] [address]    Report Status 03/13/2019 FINAL  Final  MRSA PCR Screening     Status: None   Collection Time: 03/11/19  3:47 AM  Result Value Ref Range Status   MRSA by PCR NEGATIVE NEGATIVE Final    Comment:        The GeneXpert MRSA Assay (FDA approved for NASAL specimens only), is one component of a comprehensive MRSA colonization surveillance program. It is not intended to diagnose MRSA infection nor to guide or monitor treatment for MRSA  infections. Performed at [facility], [place] [address], [place], [place] [address]   Aerobic/Anaerobic Culture (surgical/deep wound)     Status: None   Collection Time: 03/11/19  3:00 PM  Result Value Ref Range Status   Specimen Description ABSCESS DRAINAGE  Final   Special Requests MID UPPER ABDOMEN  Final   Gram Stain   Final    ABUNDANT WBC PRESENT,BOTH PMN AND MONONUCLEAR MODERATE GRAM VARIABLE ROD FEW GRAM NEGATIVE RODS Performed at [facility], [place] [address], [place], [place] [address]    Culture   Final    MODERATE PROTEUS MIRABILIS FEW ENTEROCOCCUS FAECALIS MIXED ANAEROBIC FLORA PRESENT.  CALL LAB IF FURTHER IID REQUIRED.    Report Status 03/16/2019 FINAL  Final   Organism ID, Bacteria PROTEUS MIRABILIS  Final   Organism ID, Bacteria ENTEROCOCCUS FAECALIS  Final      Susceptibility   Enterococcus faecalis - MIC*    AMPICILLIN <=2 SENSITIVE Sensitive     VANCOMYCIN 1 SENSITIVE Sensitive     GENTAMICIN SYNERGY SENSITIVE Sensitive     * FEW ENTEROCOCCUS FAECALIS   Proteus mirabilis - MIC*    AMPICILLIN <=2 SENSITIVE Sensitive     CEFAZOLIN <=4 SENSITIVE Sensitive     CEFEPIME <=1 SENSITIVE Sensitive     CEFTAZIDIME <=1 SENSITIVE Sensitive     CEFTRIAXONE <=1 SENSITIVE Sensitive     CIPROFLOXACIN <=0.25 SENSITIVE Sensitive     GENTAMICIN <=1 SENSITIVE Sensitive     IMIPENEM 2 SENSITIVE Sensitive     TRIMETH/SULFA <=20 SENSITIVE Sensitive     AMPICILLIN/SULBACTAM <=2 SENSITIVE Sensitive     PIP/TAZO <=4 SENSITIVE Sensitive     * MODERATE PROTEUS MIRABILIS  Aerobic/Anaerobic Culture (surgical/deep wound)     Status: None   Collection Time: 03/11/19  3:00 PM  Result Value Ref Range Status   Specimen Description ABSCESS DRAINAGE  Final   Special Requests LEFT LOWER ABDOMEN  Final   Gram Stain   Final    ABUNDANT WBC PRESENT,BOTH PMN AND MONONUCLEAR RARE GRAM POSITIVE COCCI FEW GRAM NEGATIVE RODS FEW GRAM VARIABLE ROD Performed at [facility], [address] [place], [place] [address]    Culture   Final    MODERATE PROTEUS MIRABILIS MIXED ANAEROBIC FLORA PRESENT.  CALL LAB IF FURTHER IID REQUIRED.    Report Status 03/16/2019 FINAL  Final   Organism ID, Bacteria PROTEUS MIRABILIS  Final      Susceptibility   Proteus mirabilis - MIC*    AMPICILLIN <=2 SENSITIVE Sensitive     CEFAZOLIN <=4 SENSITIVE Sensitive     CEFEPIME <=1 SENSITIVE Sensitive     CEFTAZIDIME <=1 SENSITIVE Sensitive     CEFTRIAXONE <=1 SENSITIVE Sensitive     CIPROFLOXACIN <=0.25 SENSITIVE Sensitive     GENTAMICIN <=1 SENSITIVE Sensitive     IMIPENEM 2 SENSITIVE Sensitive     TRIMETH/SULFA <=20 SENSITIVE Sensitive     AMPICILLIN/SULBACTAM <=2 SENSITIVE Sensitive     PIP/TAZO <=4 SENSITIVE Sensitive     *  MODERATE PROTEUS MIRABILIS  Novel Coronavirus, NAA (hospital order; send-out to ref lab)     Status: None   Collection Time: 03/14/19  3:35 PM  Result Value Ref Range Status   SARS-CoV-2, NAA NOT DETECTED NOT DETECTED Final    Comment: (NOTE) This test was developed and its performance characteristics determined by [employer]. This test has not been FDA cleared or approved. This test has been authorized by FDA under an Emergency Use Authorization (EUA). This test is only authorized for the duration of time the declaration that circumstances exist justifying the authorization of the emergency use of in vitro diagnostic tests for detection of SARS-CoV-2 virus and/or diagnosis of COVID-19 infection under section 564(b)(1) of the Act, 21 U.S.C. 563JSH-7(W)(2), unless the authorization is terminated or revoked sooner. When diagnostic testing is negative, the possibility of a false negative result should be considered in the context of a patient's recent exposures and the presence of clinical signs and symptoms consistent with COVID-19. An individual without symptoms of COVID-19 and who is not shedding SARS-CoV-2 virus would expect to have a  negative (not detected) result in this assay. Performed  At: [facility] [address] [place], [place] [id number] Rush Farmer MD YD:[id number]    [place]  Final    Comment: Performed at [facility], [place] [address], [place], [place] [address]         Radiology Studies: No results found.    Scheduled Meds:  amoxicillin  500 mg Oral Q8H   apixaban  5 mg Oral BID   diltiazem  240 mg Oral Daily   feeding supplement (GLUCERNA SHAKE)  237 mL Oral TID BM   feeding supplement (PRO-STAT SUGAR FREE 64)  30 mL Oral BID   furosemide  40 mg Oral Daily   insulin aspart  0-15 Units Subcutaneous TID WC   insulin aspart  0-5 Units Subcutaneous QHS   insulin aspart  7 Units Subcutaneous TID WC   [START ON 03/20/2019] insulin detemir  16 Units Subcutaneous Daily   insulin glargine  4 Units Subcutaneous Once   metoprolol tartrate  12.5 mg Oral BID   multivitamin with minerals  1 tablet Oral Daily   pantoprazole (PROTONIX) IV  40 mg Intravenous QHS   sertraline  125 mg Oral Daily   simvastatin  20 mg Oral q1800   sodium chloride flush  5 mL Intracatheter Q8H   Continuous Infusions:  sodium chloride 75 mL/hr at 03/13/19 0500   ceFEPime (MAXIPIME) IV 2 g (03/19/19 0952)     LOS: 8 days   Vernell Leep, MD, FACP, [facility]. [facility]  To contact the attending provider between 7A-7P or the covering provider during after hours 7P-7A, please log into the web site [email or web address] and access using universal [facility] password for that web site. If you do not have the password, please call the hospital operator.

## 2019-03-19 NOTE — Plan of Care (Signed)

## 2019-03-19 NOTE — Care Management (Signed)
CM confirmed [facility] will place order for colostomy bags from supplier upon discharge.  Pt will discharge home with colostomy bags inventory that will suffice until home order is received.

## 2019-03-19 NOTE — Progress Notes (Signed)
Patient with history of metastatic colon cancer (s/p partial colectomy and end colostomy) with secondary postoperative abscess s/p mid upper abdomen drain (labeled 1) and LLQ drain (labeled 2) placement 03/11/2019 by Dr. Anselm Pancoast.  Per Dr. Algis Liming, patient ready for discharge tomorrow. Patient to flush each drain with 10 cc normal saline once daily ([facility] [place] on [address] per patient request). Patient to record output of each drain daily (drain cards given to patient). Arbie Cookey, RN aware to instruct patient on flushing/emptying drain. Plan to follow-up in drain clinic for CT/drain injection approximately 2 weeks after discharge (order placed for this).  Please call IR with questions/concerns.   Bea Graff Makesha Belitz, PA-C 03/19/2019, 12:10 PM

## 2019-03-19 NOTE — Consult Note (Signed)
[place] Nurse ostomy follow up Stoma type/location: RUQ colostomy- pouch changed this AM due to leak per bedside RN.  Pouch is intact at this time.  Stomal assessment/size: Flush 1 1/2" pink patent and producing soft brown stool.   Peristomal assessment: not assessed Treatment options for stomal/peristomal skin: barrier ring and 1 piece flexible convex Output soft brown stool Ostomy pouching: 1pc.flexible convex  Barrier ring Education provided: Discussed self care with patient.  She has assisted with emptying.  She has observed pouch changes but has not performed one independently  Last pouch change was this AM.  She will go home with [facility] and ongoing education will be needed. I have re-enrolled her in secure start and supplies will be sent to her home.  She understands that she must participate in pouch changes and become independent. She is willing to learn.  Discussed the convex pouch and barrier ring and rationale for use, flush stoma.  She is able to cut pouch opening once it is drawn on pouch.  With practice, she is capable of self care.  Enrolled patient in [place] Discharge program: Yes  Again today. 4 pouch sets are ordered with bedside Rn assistance for discharge.  Will not follow at this time.  Please re-consult if needed.  Domenic Moras MSN, RN, FNP-BC CWON Wound, Ostomy, Continence Nurse Pager [phone]

## 2019-03-20 LAB — GLUCOSE, CAPILLARY
Glucose-Capillary: 158 mg/dL — ABNORMAL HIGH (ref 70–99)
Glucose-Capillary: 342 mg/dL — ABNORMAL HIGH (ref 70–99)

## 2019-03-20 MED ORDER — LIRAGLUTIDE 18 MG/3ML ~~LOC~~ SOPN: 1.8000 mg | PEN_INJECTOR | Freq: Every morning | SUBCUTANEOUS | 0 refills | Status: DC

## 2019-03-20 MED ORDER — GLUCERNA SHAKE PO LIQD: 237.0000 mL | Freq: Three times a day (TID) | ORAL | 12 refills | Status: DC

## 2019-03-20 MED ORDER — DILTIAZEM HCL ER COATED BEADS 240 MG PO CP24: 240.0000 mg | ORAL_CAPSULE | Freq: Every day | ORAL | 0 refills | Status: DC

## 2019-03-20 MED ORDER — HEPARIN SOD (PORK) LOCK FLUSH 100 UNIT/ML IV SOLN
500.0000 [IU] | INTRAVENOUS | Status: AC | PRN
  Administered 2019-03-20: 15:00:00 500 [IU]

## 2019-03-20 MED ORDER — APIXABAN 5 MG PO TABS: 5.0000 mg | ORAL_TABLET | Freq: Two times a day (BID) | ORAL | 0 refills | Status: DC

## 2019-03-20 MED ORDER — METOPROLOL TARTRATE 25 MG PO TABS: 12.5000 mg | ORAL_TABLET | Freq: Two times a day (BID) | ORAL | 0 refills | Status: DC

## 2019-03-20 MED ORDER — SIMVASTATIN 10 MG PO TABS: 10.0000 mg | ORAL_TABLET | Freq: Every day | ORAL | 0 refills | Status: AC

## 2019-03-20 MED ORDER — INSULIN DETEMIR 100 UNIT/ML FLEXPEN: 16.0000 [IU] | PEN_INJECTOR | Freq: Every day | SUBCUTANEOUS | 0 refills | Status: AC

## 2019-03-20 MED ORDER — AMOXICILLIN 500 MG PO CAPS: 500.0000 mg | ORAL_CAPSULE | Freq: Three times a day (TID) | ORAL | 0 refills | Status: AC

## 2019-03-20 MED ORDER — SERTRALINE HCL 100 MG PO TABS: 100.0000 mg | ORAL_TABLET | Freq: Every day | ORAL | 0 refills | Status: DC

## 2019-03-20 MED ORDER — TRAMADOL HCL 50 MG PO TABS: 50.0000 mg | ORAL_TABLET | Freq: Four times a day (QID) | ORAL | 0 refills | Status: AC | PRN

## 2019-03-20 MED ORDER — FUROSEMIDE 20 MG PO TABS: 20.0000 mg | ORAL_TABLET | Freq: Every day | ORAL | 0 refills | Status: DC

## 2019-03-20 MED ORDER — PANTOPRAZOLE SODIUM 40 MG PO TBEC: 40.0000 mg | DELAYED_RELEASE_TABLET | Freq: Every day | ORAL | 0 refills | Status: DC

## 2019-03-20 MED ORDER — CEPHALEXIN 500 MG PO CAPS: 500.0000 mg | ORAL_CAPSULE | Freq: Three times a day (TID) | ORAL | 0 refills | Status: AC

## 2019-03-20 MED ORDER — FERROUS SULFATE 325 (65 FE) MG PO TABS: 325.0000 mg | ORAL_TABLET | Freq: Every day | ORAL | 0 refills | Status: DC

## 2019-03-20 MED ORDER — SERTRALINE HCL 25 MG PO TABS: 25.0000 mg | ORAL_TABLET | Freq: Every day | ORAL | 0 refills | Status: DC

## 2019-03-20 MED ORDER — PRO-STAT SUGAR FREE PO LIQD: 30.0000 mL | Freq: Two times a day (BID) | ORAL | 0 refills | Status: DC

## 2019-03-20 MED ORDER — ADULT MULTIVITAMIN W/MINERALS CH: 1.0000 | ORAL_TABLET | Freq: Every day | ORAL | Status: DC

## 2019-03-20 MED ORDER — OXYBUTYNIN CHLORIDE ER 10 MG PO TB24: 10.0000 mg | ORAL_TABLET | Freq: Every day | ORAL | 0 refills | Status: AC

## 2019-03-20 NOTE — Progress Notes (Signed)
Explained and discussed discharge instructions with patient, extra supplies for colostomy and drains given. Pt going home with son with belongings.

## 2019-03-20 NOTE — Progress Notes (Addendum)
Called by Dr. Algis Liming about this patient around 10am today.  Dr. Algis Liming told me that patient stated she would like to restart taking Levemir insulin and Victoza daily and stop the NPH.  Per conversation with MD, pt was placed on NPH Insulin during her stay at the SNF and her Levemir and Victoza was stopped.  MD asked me about seeing if the Levemir and Victoza would be affordable for patient.  I called Samantha with the Care Mgmt team and she recommended to me to have the MD send the Rxs [facility] and then we could call the pharmacy to see how much both Rxs will cost.  Called Dr. Algis Liming back and let him know this info.  MD plans to send Rxs in today.  Addendum 1:30pm- Spoke with Aldona Bar earlier this afternoon.  Per Care Manager, Victoza co-pay will be $45 and Levemir will be $135.  Pt told Care Manager that she can afford the Victoza and has plenty of Levemir insulin at home.  Pt to follow up with her PCP next week to further discuss home regimen.    --Will follow patient during hospitalization--  Wyn Quaker RN, MSN, CDE Diabetes Coordinator Inpatient Glycemic Control Team Team Pager: [phone] (8a-5p)

## 2019-03-20 NOTE — TOC Transition Note (Signed)
Transition of Care [facility]) - CM/SW Discharge Note   Patient Details  Name: [name] MRN: [id number] Date of Birth: Jul 19, 1946  Transition of Care [facility]) CM/SW Contact:  Candie Chroman, LCSW Phone Number: 03/20/2019, 1:04 PM   Clinical Narrative:  Patient has orders to discharge home today. CSW signing off.  Final next level of care: Home w Home Health Services Barriers to Discharge: Barriers Resolved   Patient Goals and CMS Choice   CMS Medicare.gov Compare Post Acute Care list provided to:: (Plans to return to SNF she was at prior to admission.)    Discharge Placement                Patient to be transferred to facility by: Son will transport Name of family member notified: [name] Patient and family notified of of transfer: 03/20/19  Discharge Plan and Services     Post Acute Care Choice: [facility]          DME Arranged: 3-N-1 DME Agency: Other - Comment([facility])     Representative spoke with at DME Agency: [place] Determinants of Health (SDOH) Interventions     Readmission Risk Interventions Readmission Risk Prevention Plan 03/13/2019  Transportation Screening Complete  Medication Review Press photographer) Complete  PCP or Specialist appointment within 3-5 days of discharge Complete  HRI or [place] Not Complete  HRI or Home Care Consult Pt Refusal Comments Plan for discharge to SNF.  SW Recovery Care/Counseling Consult Complete  Palliative Care Screening Not Applicable  Skilled Nursing Facility Complete  Some recent data might be hidden

## 2019-03-20 NOTE — Care Management (Addendum)
Pt now has discharge orders and prescriptions have been faxed to preferred pharmacy.  CM contacted [facility] to inquire about prescription copays for Victoza and Levimer.  CM was informed that the Victoza copay is $45 and the levemir is $135. CM discussed copays with pt - pt said she could afford the [place] and that she has ample supply of Levemir.  CM instructed pt that if hardship occurs due to medication copay to follow up with PCP, pt informed CM that she has been able in the past to receive samples from PCP.

## 2019-03-20 NOTE — Plan of Care (Signed)

## 2019-03-20 NOTE — Discharge Summary (Addendum)
Physician Discharge Summary  Cassandra Allen FMB:[id number] DOB: May 14, 1946  PCP: [name], MD  Admit date: 03/10/2019 Discharge date: 03/20/2019  Recommendations for Outpatient Follow-up:  1. Dr. [name], PCP on 03/27/2019 at 9 AM.  To be seen with repeat labs (CBC & BMP). 2. Drain clinic with Interventional Radiology in 2 weeks.  Their office will coordinate. 3. Dr. Thompson Grayer, EP Cardiology on 04/11/2019 at 9 AM for virtual office visit.  Home Health: PT, OT, RN, aide and clinical social work Equipment/Devices: 3 n 1  Discharge Condition: Improved and stable CODE STATUS: Full Diet recommendation: Heart healthy and diabetic diet.  Discharge Diagnoses:  Principal Problem:   Abscess of abdominal cavity ([place]) Active Problems:   Essential (primary) hypertension   HLD (hyperlipidemia)   Depression, recurrent (HCC)   Anticoagulant long-term use   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   CKD (chronic kidney disease) stage 3, GFR 30-59 ml/min (HCC)   Atrial fibrillation with RVR (HCC)   Chronic kidney disease (CKD), stage II (mild)   Sepsis (HCC)   Type II diabetes mellitus with renal manifestations (HCC)   Hypokalemia   Chronic diastolic CHF (congestive heart failure) (HCC)   Abnormal LFTs   Elevated troponin   Severe sepsis (HCC)   Pleural effusion on left   Brief Summary: Cassandra Allen 73 y.o.WF PMHxHTN, HLD, diabetes type 2/IDDM controlled with complication, chronic diastolic CHF, atrial fibrillationon Eliquis, metastatic colon cancer (s/p ofpartial colectomy, chemotherapy),s/p of colostomy,CKD stage III, depression, had surgery which was complicated by intra-abdominal abscess, drain placed by IR grew enterococcus, discharged on antibiotics and drain removed 4/22, now presented from SNF with abdominal pain, nausea and vomiting. Found to have a temp of 100.7, WBC 12.7, A-fib with RVR and HR in 170s.  Admitted for enlarging intra-abdominal  abscess.  IR consulted and 2 drains placed.  Ongoing antibiotics.  Clinically improved.   Assessment & Plan:   Principal Problem:   Abscesses of abdominal cavity - severe sepsis - Eliquis had been held for drainage, now resumed. - 4/29> IR guided drainage with 2 drains placed-1 in the upper mid abdomen and the second 1 in left lower quadrant. - Completed 10 days of IV cefepime and 9 days of IV Flagyl in the hospital. - Mid upper abdomen drain culture: Proteus mirabilis and Enterococcus faecalis - LLQ drain CX -  Proteus mirabilis - IR feels that she will need to go home with drains.  I discussed with IR MD on call 5/6 and confirmed that patient will be going home with drains, they will arrange outpatient follow-up and drain clinic and drain management instructions. -I discussed with ID MD on call on 5/6 who recommended additional 2 weeks of amoxicillin (for Proteus and enterococcus in abscess) and Keflex (for E. coli bacteremia) at discharge.  -Improved.  -Patient has some intermittent pain for which she has been using some opioids in the hospital.  [place] controlled substance database and she last received 3 days course of tramadol early April.  Provided short supply of tramadol for moderate to severe pain.  Active Problems: Bacteremia with E coli - blood culture from 4/27 > E coli sensitive to cefepime, Cefazolin, Cipro.  -Completed 10 days of IV cefepime in the hospital  -As discussed with ID MD on call on 5/6, recommends Keflex for this indication at discharge for additional 2 weeks.  Acute on chronic diastolic CHF/pleural effusions - with left > right pleural effusion on CR on 5/1 -  has been treated with IV Lasix  - CXR repeated-  reveals minimal pleural effusion now - wt 160 >142 pounds since admission. - -9.8 L since admission - Clinically euvolemic/compensated.  Transitioned to oral Lasix 5/5. -May consider repeating chest x-ray in a couple of weeks to  follow-up on pleural effusions if deemed necessary.  A-fib with RVR on admission -Apixaban was temporarily held and patient was placed briefly on IV heparin prior to drain placement. - 5/4> d/c Heparin and resumed Apixaban - cont Cardizem and Lopressor -Controlled ventricular rate.  Hypokalemia -Replaced.  Hypomagnesemia replaced.  Protein calorie malnutrition with weight loss -Management as per dietitian recommendation.    DM2/IDDM -Of note prior to multiple recent hospitalizations, patient was on long-term Levemir and Victoza.  Somewhere during the course of hospitalization and SNF admission, she was switched to NPH.  She indicates that she still has substantial amount of Levemir at home. -Treated in the hospital with adjusted doses of Lantus, NovoLog mealtime and sliding scale.  Better control since last night. -Discussed extensively with patient and diabetes coordinator.  At discharge patient will resume prior Levemir at increased dose and Victoza.  A1c 6.8 on 03/11/2019.  Close outpatient follow-up with PCP.    HLD (hyperlipidemia) - Simvastatin dose reduced from 20mg  to 10 mg daily due to concurrent use of Cardizem and to avoid toxicity.    Depression, recurrent  - cont Zoloft at total dose of 125 mg daily.    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 - managed by [name] who is aware of her admission- plan for PET once she recovers -Patient has history of metastatic colon cancer status post partial colectomy and end colostomy complicated by postoperative abscess.    CKD (chronic kidney disease) stage 3, GFR 30-59 ml/min  - stable  Anemia of chronic disease Stable.  Continue ferrous sulfate.   Consultations:  Interventional radiology  General surgery  Procedures:  Percutaneous abdominal drains x2.   Discharge Instructions  Discharge Instructions    (HEART FAILURE PATIENTS) Call MD:  Anytime you have any of the following symptoms: 1) 3 pound weight  gain in 24 hours or 5 pounds in 1 week 2) shortness of breath, with or without a dry hacking cough 3) swelling in the hands, feet or stomach 4) if you have to sleep on extra pillows at night in order to breathe.   Complete by:  As directed    AMB Referral to [place] [facility]   Complete by:  As directed    FYI, Patient is a referral from insurance plan returning home from long stay at [facility] and hospital referral.  1) Please assign patient to [facility] social worker for resources for supplies of ostomy needs. Patient will have home health care for drain care. 2) Please assign patient to [facility] pharmacist about prescription copays for Victoza and Levimer cost/co-pay issues.   For questions,  Natividad Brood, RN BSN [facility] Liaison  [phone] business mobile phone Toll free office [phone]  Fax number: [phone] [email or web address]  [email or web address]   Reason for consult:  Diabetic medication follow up and Ostomy supplier   Diagnoses of:   Diabetes Cancer with Mets     Expected date of contact:  1-3 days (reserved for hospital discharges)   Call MD for:  difficulty breathing, headache or visual disturbances   Complete by:  As directed    Call MD for:  extreme fatigue   Complete by:  As directed    Call  MD for:  persistant dizziness or light-headedness   Complete by:  As directed    Call MD for:  persistant nausea and vomiting   Complete by:  As directed    Call MD for:  redness, tenderness, or signs of infection (pain, swelling, redness, odor or green/yellow discharge around incision site)   Complete by:  As directed    Call MD for:  severe uncontrolled pain   Complete by:  As directed    Call MD for:  temperature >100.4   Complete by:  As directed    Diet - low sodium heart healthy   Complete by:  As directed    Diet Carb Modified   Complete by:  As directed    Discharge instructions   Complete by:  As directed    Patient to  flush each drain with 10 cc normal saline once daily (presription sent to [facility] in [place] on [address] per patient request). Patient to record output of each drain daily (drain cards given to patient).   Increase activity slowly   Complete by:  As directed        Medication List    STOP taking these medications   Bentyl 10 MG capsule Generic drug:  dicyclomine   bisacodyl 10 MG suppository Commonly known as:  DULCOLAX   HumuLIN N KwikPen 100 UNIT/ML Kiwkpen Generic drug:  Insulin NPH (Human) (Isophane)   Infants Gas Relief 40 MG/0.6ML drops Generic drug:  simethicone   magnesium hydroxide 400 MG/5ML suspension Commonly known as:  MILK OF MAGNESIA   Melatonin 3 MG Tabs   NON FORMULARY   ondansetron 8 MG tablet Commonly known as:  ZOFRAN   traZODone 50 MG tablet Commonly known as:  DESYREL     TAKE these medications   acetaminophen 500 MG tablet Commonly known as:  TYLENOL Take 500 mg by mouth every 4 (four) hours as needed for mild pain or fever.   amoxicillin 500 MG capsule Commonly known as:  AMOXIL Take 1 capsule (500 mg total) by mouth 3 (three) times daily for 14 days.   apixaban 5 MG Tabs tablet Commonly known as:  Eliquis Take 1 tablet (5 mg total) by mouth 2 (two) times daily.   cephALEXin 500 MG capsule Commonly known as:  KEFLEX Take 1 capsule (500 mg total) by mouth 3 (three) times daily for 14 days.   diltiazem 240 MG 24 hr capsule Commonly known as:  CARDIZEM CD Take 1 capsule (240 mg total) by mouth daily.   feeding supplement (GLUCERNA SHAKE) Liqd Take 237 mLs by mouth 3 (three) times daily between meals. What changed:  when to take this   feeding supplement (PRO-STAT SUGAR FREE 64) Liqd Take 30 mLs by mouth 2 (two) times daily.   ferrous sulfate 325 (65 FE) MG tablet Commonly known as:  KP Ferrous Sulfate Take 1 tablet (325 mg total) by mouth daily with breakfast.   furosemide 20 MG tablet Commonly known as:  LASIX Take  1 tablet (20 mg total) by mouth daily. May take an additional 1 tablet (20 mg) as needed for wt gain of 3 lbs in 1 day or 5 lbs in 1 week What changed:    when to take this  additional instructions   Insulin Detemir 100 UNIT/ML Pen Commonly known as:  LEVEMIR Inject 16 Units into the skin daily. Start taking on:  Mar 21, 2019   liraglutide 18 MG/3ML Sopn Commonly known as:  VICTOZA Inject 0.3  mLs (1.8 mg total) into the skin every morning.   metoprolol tartrate 25 MG tablet Commonly known as:  LOPRESSOR Take 0.5 tablets (12.5 mg total) by mouth 2 (two) times daily.   multivitamin with minerals Tabs tablet Take 1 tablet by mouth daily. Start taking on:  Mar 21, 2019   oxybutynin 10 MG 24 hr tablet Commonly known as:  DITROPAN-XL Take 1 tablet (10 mg total) by mouth daily.   pantoprazole 40 MG tablet Commonly known as:  Protonix Take 1 tablet (40 mg total) by mouth daily for 30 days.   sertraline 100 MG tablet Commonly known as:  ZOLOFT Take 1 tablet (100 mg total) by mouth daily. TAKE 1 TABLET BY MOUTH ONCE DAILY (TAKE WITH 25MG  TO=125MG ) FOR DEPRESSION What changed:  how much to take   sertraline 25 MG tablet Commonly known as:  ZOLOFT Take 1 tablet (25 mg total) by mouth daily. TAKE 1 TABLET BY MOUTH ONCE DAILY (TAKE WITH 100MG  TO=125MG ) FOR DEPRESSION What changed:  how much to take   simvastatin 10 MG tablet Commonly known as:  ZOCOR Take 1 tablet (10 mg total) by mouth daily at 6 PM. What changed:    medication strength  how much to take  when to take this   sodium chloride 0.9 % injection Inject 10 mLs into the vein daily. Flush each drain (two drains total) with 10 cc normal saline once daily   traMADol 50 MG tablet Commonly known as:  Ultram Take 1 tablet (50 mg total) by mouth every 6 (six) hours as needed for moderate pain or severe pain.      Follow-up Information    [name], MD. Go on 03/27/2019.   Specialty:  Family Medicine Why:  Appt  scheduled for 9:00 am. To be seen with repeat labs (CBC & BMP). Contact information: [address] [place] [address] [phone]        Thompson Grayer, MD Follow up.   Specialty:  Cardiology Contact information: [address]  [place] [address] [facility] Follow up.   Why:  They will call you or your son to set up initial appointment. They will follow you for home health PT, OT, RN, aide, and social worker.         Allergies  Allergen Reactions  . Metformin And Related Nausea And Vomiting  . Prednisone Nausea And Vomiting      Procedures/Studies: Ct Chest Wo Contrast  Result Date: 03/14/2019 CLINICAL DATA:  Chest pain. Pleural effusion. Pulmonary origin of pain suspected. EXAM: CT CHEST WITHOUT CONTRAST TECHNIQUE: Multidetector CT imaging of the chest was performed following the standard protocol without IV contrast. COMPARISON:  Chest radiographs, most recent 1 day prior. Chest CT 11/01/2018. FINDINGS: Cardiovascular: Aortic and branch vessel atherosclerosis. Moderate cardiomegaly, without pericardial effusion. Multivessel coronary artery atherosclerosis. Pulmonary artery enlargement, outflow tract 3.3 cm. Right Port-A-Cath tip at high right atrium. Mediastinum/Nodes: Hypoattenuating left-sided thyroid nodule on the order of 2.1 cm, grossly similar. No supraclavicular adenopathy. 11 mm right paratracheal node is minimally decreased from 12 mm on the prior. Hilar regions poorly evaluated without intravenous contrast. Lungs/Pleura: Moderate left and small right pleural effusions. Mild motion degradation throughout. Left greater than right base collapse/consolidation. Upper Abdomen: Previously described liver lesions are not well evaluated on this noncontrast exam. Normal imaged portions of the spleen, pancreas, stomach. Percutaneous catheter within an upper abdominal gas and  fluid collection,  incompletely imaged. Perihepatic small volume ascites is new since 11/01/2018. Incompletely imaged left adrenal nodule measures 3.1 x 3.0 cm today versus 2.9 x 3.1 cm on the prior. Musculoskeletal: Moderate thoracic spondylosis. IMPRESSION: 1. Left larger than right pleural effusions. Left greater than right base collapse/consolidation. Favor atelectasis at the right lower lobe. The left lower lobe process could represent atelectasis or infection. 2. Coronary artery atherosclerosis. Aortic Atherosclerosis (ICD10-I70.0). 3. Pulmonary artery enlargement suggests pulmonary arterial hypertension. 4. Small volume upper abdominal ascites, increased from 03/10/2019. 5. 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 Electronically Signed   By: Abigail Miyamoto M.D.   On: 03/14/2019 12:29   Ct Abdomen Pelvis W Contrast  Result Date: 03/11/2019 CLINICAL DATA:  Follow-up abdominal abscess. Previous sigmoid colectomy for colon carcinoma. Undergoing chemotherapy. EXAM: CT ABDOMEN AND PELVIS WITH CONTRAST TECHNIQUE: Multidetector CT imaging of the abdomen and pelvis was performed using the standard protocol following bolus administration of intravenous contrast. CONTRAST:  159mL OMNIPAQUE IOHEXOL 300 MG/ML  SOLN COMPARISON:  03/05/2019 FINDINGS: Lower Chest: Small to moderate left pleural effusion and left lower lobe atelectasis show no significant change. Hepatobiliary: 3.9 cm hypovascular mass involving the caudate lobe of the liver shows no significant change compared to prior study. No new or enlarging liver masses are identified. Gallbladder is unremarkable. No evidence of biliary ductal dilatation. Pancreas:  No mass or inflammatory changes. Spleen: No evidence of splenomegaly. A few tiny sub-cm low-attenuation lesions in the spleen are stable. Adrenals/Urinary Tract: Stable 3.3 cm left adrenal mass, previously shown to represent an adenoma by MRI. Stable mild left renal atrophy. No evidence of renal  masses or hydronephrosis. Unremarkable unopacified urinary bladder. Stomach/Bowel: Postop changes again seen from previous left hemicolectomy with transverse colostomy. A large gas and fluid collection is again seen in the upper abdomen anterior to the stomach. This measures 13.2 x 9.7 cm, without significant change in size compared to previous study. Previously seen left abdominal percutaneous drainage catheter has been removed. A persistent fluid collection in the left lower abdomen and extending into the central pelvis is seen at this location. This collection measures 20.2 x 3.9 cm, with mildly increased in size from 19.4 x 2.3 cm previously. No new fluid collections are identified. No evidence of bowel obstruction no evidence of free intraperitoneal air. Vascular/Lymphatic: No pathologically enlarged lymph nodes. No abdominal aortic aneurysm. Reproductive:  No mass or other significant abnormality. Other:  None. Musculoskeletal:  No suspicious bone lesions identified. IMPRESSION: 1. Persistent large gas and fluid collection in upper abdomen anterior to the stomach, without significant change. 2. Mild increase in size of large fluid collection extending across the left lower abdomen and central pelvis, following removal of percutaneous drainage catheter. 3. Caudate lobe liver metastasis, without significant interval change. No new or progressive metastatic disease identified. 4. Stable left pleural effusion and left lower lobe atelectasis. Electronically Signed   By: Earle Gell M.D.   On: 03/11/2019 00:10   Dg Chest Port 1 View  Result Date: 03/17/2019 CLINICAL DATA:  73 year old female with shortness of breath EXAM: PORTABLE CHEST 1 VIEW COMPARISON:  CT 03/14/2019, chest x-ray 03/13/2019 FINDINGS: Cardiomediastinal silhouette unchanged in size and contour. Right IJ port catheter with the tip appearing to terminate superior vena cava, unchanged. Low lung volumes with crowded interstitium. Similar appearance  of opacity at the left lung base with blunting of left costophrenic angle and obscuration of left hemidiaphragm. No interlobular septal thickening. IMPRESSION: Persisting  left basilar opacity likely combination of small pleural effusion and associated atelectasis/consolidation. Unchanged right port catheter. Electronically Signed   By: Corrie Mckusick D.O.   On: 03/17/2019 08:01   Dg Chest Port 1 View  Result Date: 03/13/2019 CLINICAL DATA:  Shortness of breath. EXAM: PORTABLE CHEST 1 VIEW COMPARISON:  Chest x-rays dated 03/10/2019, 02/05/2019 and 12/31/2018 FINDINGS: Power port in place in good position with the tip at the cavoatrial junction. Moderate left pleural effusion has slightly increased. Overall heart size and pulmonary vascularity are normal. Right lung is clear. No acute bone abnormality. Aortic atherosclerosis. Abscess drainage catheter in the left mid abdomen. IMPRESSION: Slightly increased moderate left pleural effusion. Aortic Atherosclerosis (ICD10-I70.0). Electronically Signed   By: Lorriane Shire M.D.   On: 03/13/2019 11:51   Dg Chest Port 1 View  Result Date: 03/10/2019 CLINICAL DATA:  Initial evaluation for acute fever. EXAM: PORTABLE CHEST 1 VIEW COMPARISON:  Prior radiograph from 02/05/2019 FINDINGS: Right-sided Port-A-Cath in place, stable. Cardiomegaly, unchanged. Mediastinal silhouette normal. Aortic atherosclerosis. Persistent small to moderate left pleural effusion. Associated left basilar opacity could reflect atelectasis or infiltrate. Underlying mild diffuse pulmonary interstitial edema. No pneumothorax. No acute osseous finding. IMPRESSION: 1. Persistent small to moderate left pleural effusion with associated left basilar opacity, which may reflect atelectasis or infiltrate. 2. Cardiomegaly with underlying mild diffuse pulmonary interstitial edema. 3. Aortic atherosclerosis. Electronically Signed   By: Jeannine Boga M.D.   On: 03/10/2019 22:03   Ct Image Guided  Drainage By Percutaneous Catheter  Result Date: 03/11/2019 INDICATION: 73 year old with metastatic colon cancer and status post partial colectomy and end colostomy. History of postoperative abscess with left lower abdominal abscess drain. The drain was recently removed due to partial dislodgement. Patient needs placement of a new drainage catheter within the lower abdominal abscess. In addition, the patient has a complex air-fluid collection in the midline of the upper abdomen. This is a concerning for complex abscess collection. EXAM: CT GUIDED DRAINAGE OF MIDLINE UPPER ABDOMINAL ABSCESS CT-GUIDED DRAINAGE OF LEFT LOWER ABDOMINAL ABSCESS MEDICATIONS: The patient is currently admitted to the hospital and receiving intravenous antibiotics. ANESTHESIA/SEDATION: 0.5 mg IV Versed 50 mcg IV Fentanyl Moderate Sedation Time: The patient was continuously monitored during the procedure by the interventional radiology nurse under my direct supervision. COMPLICATIONS: None immediate. TECHNIQUE: Informed written consent was obtained from the patient after a thorough discussion of the procedural risks, benefits and alternatives. All questions were addressed. Maximal Sterile Barrier Technique was utilized including caps, mask, sterile gowns, sterile gloves, sterile drape, hand hygiene and skin antiseptic. A timeout was performed prior to the initiation of the procedure. PROCEDURE: Patient was placed supine on the CT scanner. Images through the abdomen were obtained. The mid upper abdomen was prepped and draped in sterile fashion. Skin was anesthetized with 1% lidocaine. 18 gauge trocar needle was directed into the complex air-fluid collection with CT guidance. No air or fluid was aspirating from the needle. Stiff Amplatz wire was advanced into the irregular collection and a Yueh catheter was advanced over the wire. Thick green fluid was aspirated from the catheter. Amplatz wire was advanced back into the collection and the  tract was dilated to accommodate a 12 [place] drain. Greater than 150 mL of thick green fluid was removed from the complex upper abdominal collection. Catheter was sutured to skin and attached to a suction bulb. Attention was directed to the fluid collection in the left lower abdomen that previously had a drain. Left lower abdomen was prepped  and draped in a sterile fashion. Skin was anesthetized with 1% lidocaine. Using CT guidance, 18 gauge trocar needle was directed into the left lower quadrant collection. Yellow purulent fluid was aspirated. Stiff Amplatz wire was advanced into the collection. Tract was dilated to accommodate a 12 [place] drain. Approximately 30 mL of purulent fluid was removed. Catheter was sutured to skin and attached to a suction bulb. Sample of both abscess collections were sent for culture. FINDINGS: Complex air-fluid collection in the mid upper abdomen. Greater than 150 mL of foul-smelling thick green fluid was removed from the upper abdominal collection. 12 French drain was placed within this upper abdominal collection. 12 French drain placed in the residual left lower abdominal collection and 30 mL of yellow purulent fluid was removed. IMPRESSION: 1. CT-guided placement of a 12 French drain in the complex mid upper abdominal abscess collection. 2. CT-guided placement of a 12 French drain in the residual left lower abdominal abscess collection. Electronically Signed   By: Markus Daft M.D.   On: 03/11/2019 15:42   Ct Image Guided Drainage By Percutaneous Catheter  Result Date: 03/11/2019 INDICATION: 73 year old with metastatic colon cancer and status post partial colectomy and end colostomy. History of postoperative abscess with left lower abdominal abscess drain. The drain was recently removed due to partial dislodgement. Patient needs placement of a new drainage catheter within the lower abdominal abscess. In addition, the patient has a complex air-fluid collection in the midline of the  upper abdomen. This is a concerning for complex abscess collection. EXAM: CT GUIDED DRAINAGE OF MIDLINE UPPER ABDOMINAL ABSCESS CT-GUIDED DRAINAGE OF LEFT LOWER ABDOMINAL ABSCESS MEDICATIONS: The patient is currently admitted to the hospital and receiving intravenous antibiotics. ANESTHESIA/SEDATION: 0.5 mg IV Versed 50 mcg IV Fentanyl Moderate Sedation Time: The patient was continuously monitored during the procedure by the interventional radiology nurse under my direct supervision. COMPLICATIONS: None immediate. TECHNIQUE: Informed written consent was obtained from the patient after a thorough discussion of the procedural risks, benefits and alternatives. All questions were addressed. Maximal Sterile Barrier Technique was utilized including caps, mask, sterile gowns, sterile gloves, sterile drape, hand hygiene and skin antiseptic. A timeout was performed prior to the initiation of the procedure. PROCEDURE: Patient was placed supine on the CT scanner. Images through the abdomen were obtained. The mid upper abdomen was prepped and draped in sterile fashion. Skin was anesthetized with 1% lidocaine. 18 gauge trocar needle was directed into the complex air-fluid collection with CT guidance. No air or fluid was aspirating from the needle. Stiff Amplatz wire was advanced into the irregular collection and a Yueh catheter was advanced over the wire. Thick green fluid was aspirated from the catheter. Amplatz wire was advanced back into the collection and the tract was dilated to accommodate a 12 [place] drain. Greater than 150 mL of thick green fluid was removed from the complex upper abdominal collection. Catheter was sutured to skin and attached to a suction bulb. Attention was directed to the fluid collection in the left lower abdomen that previously had a drain. Left lower abdomen was prepped and draped in a sterile fashion. Skin was anesthetized with 1% lidocaine. Using CT guidance, 18 gauge trocar needle was directed  into the left lower quadrant collection. Yellow purulent fluid was aspirated. Stiff Amplatz wire was advanced into the collection. Tract was dilated to accommodate a 12 [place] drain. Approximately 30 mL of purulent fluid was removed. Catheter was sutured to skin and attached to a suction bulb. Sample of both  abscess collections were sent for culture. FINDINGS: Complex air-fluid collection in the mid upper abdomen. Greater than 150 mL of foul-smelling thick green fluid was removed from the upper abdominal collection. 12 French drain was placed within this upper abdominal collection. 12 French drain placed in the residual left lower abdominal collection and 30 mL of yellow purulent fluid was removed. IMPRESSION: 1. CT-guided placement of a 12 French drain in the complex mid upper abdominal abscess collection. 2. CT-guided placement of a 12 French drain in the residual left lower abdominal abscess collection. Electronically Signed   By: Markus Daft M.D.   On: 03/11/2019 15:42      Subjective: Patient feels a little anxious returning home.  No abdominal pain.  Tolerating diet.  Transient mild nausea this morning without vomiting.  Her colostomy bag apparently leaked last night and a new bag applied this morning.  As per RN, no acute issues, ambulated with PT yesterday.  Discharge Exam:  Vitals:   03/20/19 0516 03/20/19 0536 03/20/19 0748 03/20/19 1117  BP:   138/62 125/77  Pulse:   81 77  Resp:      Temp:   98.6 F (37 C) 99 F (37.2 C)  TempSrc:   Axillary Oral  SpO2:   98% 98%  Weight: 74.1 kg 74.4 kg    Height:        General exam: Pleasant elderly female, moderately built and nourished sitting up comfortably in chair this morning. Respiratory system: Slightly diminished breath sounds in the bases.  Rest of lung fields clear to auscultation without wheezing, rhonchi or crackles. Cardiovascular system: S1 and S2 heard, irregularly irregular.  No JVD, murmurs or pedal edema.  Telemetry  personally reviewed: A. fib with controlled ventricular rate. Gastrointestinal system: Abdomen is nondistended, soft and nontender.  Normal bowel sounds heard.  2 percutaneous abdominal drains present 1 in mid upper abdomen and one in left lower quadrant and connecting bulbs with minimal serous drainage.  Colostomy intact without acute findings. Central nervous system:Alert and oriented. No focal neurological deficits. Extremities: No cyanosis, clubbing or edema.  All extremities grade 5 x 5 power except left lower extremity with grade 4+ by 5 power. Skin: No rashes or ulcers Psychiatry:Mood &affect appropriate.    The results of significant diagnostics from this hospitalization (including imaging, microbiology, ancillary and laboratory) are listed below for reference.     Microbiology: Recent Results (from the past 240 hour(s))  Urine culture     Status: None   Collection Time: 03/10/19  8:41 PM  Result Value Ref Range Status   Specimen Description URINE, CATHETERIZED  Final   Special Requests NONE  Final   Culture   Final    NO GROWTH Performed at [facility], [address], [place], [place] [address]    Report Status 03/13/2019 FINAL  Final  Blood Culture (routine x 2)     Status: Abnormal   Collection Time: 03/10/19  9:37 PM  Result Value Ref Range Status   Specimen Description BLOOD RIGHT HAND  Final   Special Requests   Final    BOTTLES DRAWN AEROBIC AND ANAEROBIC Blood Culture results may not be optimal due to an inadequate volume of blood received in culture bottles   Culture  Setup Time   Final    GRAM NEGATIVE RODS IN BOTH AEROBIC AND ANAEROBIC BOTTLES CRITICAL RESULT CALLED TO, READ BACK BY AND VERIFIED WITH: Cristopher Estimable PharmD 11:10 03/11/19 ([email or web address]) Performed at [facility],  [address] [place], [place] [address]    Culture ESCHERICHIA COLI (A)  Final   Report Status 03/13/2019 FINAL  Final   Organism ID, Bacteria ESCHERICHIA COLI  Final       Susceptibility   Escherichia coli - MIC*    AMPICILLIN >=32 RESISTANT Resistant     CEFAZOLIN <=4 SENSITIVE Sensitive     CEFEPIME <=1 SENSITIVE Sensitive     CEFTAZIDIME <=1 SENSITIVE Sensitive     CEFTRIAXONE <=1 SENSITIVE Sensitive     CIPROFLOXACIN <=0.25 SENSITIVE Sensitive     GENTAMICIN <=1 SENSITIVE Sensitive     IMIPENEM <=0.25 SENSITIVE Sensitive     TRIMETH/SULFA <=20 SENSITIVE Sensitive     AMPICILLIN/SULBACTAM >=32 RESISTANT Resistant     PIP/TAZO <=4 SENSITIVE Sensitive     Extended ESBL NEGATIVE Sensitive     * ESCHERICHIA COLI  Blood Culture ID Panel (Reflexed)     Status: Abnormal   Collection Time: 03/10/19  9:37 PM  Result Value Ref Range Status   Enterococcus species NOT DETECTED NOT DETECTED Final   Listeria monocytogenes NOT DETECTED NOT DETECTED Final   Staphylococcus species NOT DETECTED NOT DETECTED Final   Staphylococcus aureus (BCID) NOT DETECTED NOT DETECTED Final   Streptococcus species NOT DETECTED NOT DETECTED Final   Streptococcus agalactiae NOT DETECTED NOT DETECTED Final   Streptococcus pneumoniae NOT DETECTED NOT DETECTED Final   Streptococcus pyogenes NOT DETECTED NOT DETECTED Final   Acinetobacter baumannii NOT DETECTED NOT DETECTED Final   Enterobacteriaceae species DETECTED (A) NOT DETECTED Final    Comment: Enterobacteriaceae represent a large family of gram-negative bacteria, not a single organism. CRITICAL RESULT CALLED TO, READ BACK BY AND VERIFIED WITH: Cristopher Estimable PharmD 11:10 03/11/19 ([email or web address])    Enterobacter cloacae complex NOT DETECTED NOT DETECTED Final   Escherichia coli DETECTED (A) NOT DETECTED Final    Comment: CRITICAL RESULT CALLED TO, READ BACK BY AND VERIFIED WITH: Cristopher Estimable PharmD 11:10 03/11/19 ([email or web address])    Klebsiella oxytoca NOT DETECTED NOT DETECTED Final   Klebsiella pneumoniae NOT DETECTED NOT DETECTED Final   Proteus species NOT DETECTED NOT DETECTED Final   Serratia marcescens NOT DETECTED NOT DETECTED  Final   Carbapenem resistance NOT DETECTED NOT DETECTED Final   Haemophilus influenzae NOT DETECTED NOT DETECTED Final   Neisseria meningitidis NOT DETECTED NOT DETECTED Final   Pseudomonas aeruginosa NOT DETECTED NOT DETECTED Final   Candida albicans NOT DETECTED NOT DETECTED Final   Candida glabrata NOT DETECTED NOT DETECTED Final   Candida krusei NOT DETECTED NOT DETECTED Final   Candida parapsilosis NOT DETECTED NOT DETECTED Final   Candida tropicalis NOT DETECTED NOT DETECTED Final    Comment: Performed at [facility], [place] [address] [place], [place] [address]  Blood Culture (routine x 2)     Status: Abnormal   Collection Time: 03/10/19  9:50 PM  Result Value Ref Range Status   Specimen Description BLOOD LEFT ARM  Final   Special Requests   Final    BOTTLES DRAWN AEROBIC AND ANAEROBIC Blood Culture adequate volume   Culture  Setup Time   Final    GRAM NEGATIVE RODS IN BOTH AEROBIC AND ANAEROBIC BOTTLES CRITICAL VALUE NOTED.  VALUE IS CONSISTENT WITH PREVIOUSLY REPORTED AND CALLED VALUE.    Culture (A)  Final    ESCHERICHIA COLI SUSCEPTIBILITIES PERFORMED ON PREVIOUS CULTURE WITHIN THE LAST 5 DAYS. Performed at [facility], [place] [address], [place], [place] [address]    Report  Status 03/13/2019 FINAL  Final  MRSA PCR Screening     Status: None   Collection Time: 03/11/19  3:47 AM  Result Value Ref Range Status   MRSA by PCR NEGATIVE NEGATIVE Final    Comment:        The GeneXpert MRSA Assay (FDA approved for NASAL specimens only), is one component of a comprehensive MRSA colonization surveillance program. It is not intended to diagnose MRSA infection nor to guide or monitor treatment for MRSA infections. Performed at [facility], [place] [address] [place], [place] [address]   Aerobic/Anaerobic Culture (surgical/deep wound)     Status: None   Collection Time: 03/11/19  3:00 PM  Result Value Ref Range Status   Specimen Description ABSCESS DRAINAGE   Final   Special Requests MID UPPER ABDOMEN  Final   Gram Stain   Final    ABUNDANT WBC PRESENT,BOTH PMN AND MONONUCLEAR MODERATE GRAM VARIABLE ROD FEW GRAM NEGATIVE RODS Performed at [facility], [place] [address] [place], [place] [address]    Culture   Final    MODERATE PROTEUS MIRABILIS FEW ENTEROCOCCUS FAECALIS MIXED ANAEROBIC FLORA PRESENT.  CALL LAB IF FURTHER IID REQUIRED.    Report Status 03/16/2019 FINAL  Final   Organism ID, Bacteria PROTEUS MIRABILIS  Final   Organism ID, Bacteria ENTEROCOCCUS FAECALIS  Final      Susceptibility   Enterococcus faecalis - MIC*    AMPICILLIN <=2 SENSITIVE Sensitive     VANCOMYCIN 1 SENSITIVE Sensitive     GENTAMICIN SYNERGY SENSITIVE Sensitive     * FEW ENTEROCOCCUS FAECALIS   Proteus mirabilis - MIC*    AMPICILLIN <=2 SENSITIVE Sensitive     CEFAZOLIN <=4 SENSITIVE Sensitive     CEFEPIME <=1 SENSITIVE Sensitive     CEFTAZIDIME <=1 SENSITIVE Sensitive     CEFTRIAXONE <=1 SENSITIVE Sensitive     CIPROFLOXACIN <=0.25 SENSITIVE Sensitive     GENTAMICIN <=1 SENSITIVE Sensitive     IMIPENEM 2 SENSITIVE Sensitive     TRIMETH/SULFA <=20 SENSITIVE Sensitive     AMPICILLIN/SULBACTAM <=2 SENSITIVE Sensitive     PIP/TAZO <=4 SENSITIVE Sensitive     * MODERATE PROTEUS MIRABILIS  Aerobic/Anaerobic Culture (surgical/deep wound)     Status: None   Collection Time: 03/11/19  3:00 PM  Result Value Ref Range Status   Specimen Description ABSCESS DRAINAGE  Final   Special Requests LEFT LOWER ABDOMEN  Final   Gram Stain   Final    ABUNDANT WBC PRESENT,BOTH PMN AND MONONUCLEAR RARE GRAM POSITIVE COCCI FEW GRAM NEGATIVE RODS FEW GRAM VARIABLE ROD Performed at [facility], [address], [place], [place] [address]    Culture   Final    MODERATE PROTEUS MIRABILIS MIXED ANAEROBIC FLORA PRESENT.  CALL LAB IF FURTHER IID REQUIRED.    Report Status 03/16/2019 FINAL  Final   Organism ID, Bacteria PROTEUS MIRABILIS  Final       Susceptibility   Proteus mirabilis - MIC*    AMPICILLIN <=2 SENSITIVE Sensitive     CEFAZOLIN <=4 SENSITIVE Sensitive     CEFEPIME <=1 SENSITIVE Sensitive     CEFTAZIDIME <=1 SENSITIVE Sensitive     CEFTRIAXONE <=1 SENSITIVE Sensitive     CIPROFLOXACIN <=0.25 SENSITIVE Sensitive     GENTAMICIN <=1 SENSITIVE Sensitive     IMIPENEM 2 SENSITIVE Sensitive     TRIMETH/SULFA <=20 SENSITIVE Sensitive     AMPICILLIN/SULBACTAM <=2 SENSITIVE Sensitive     PIP/TAZO <=4  SENSITIVE Sensitive     * MODERATE PROTEUS MIRABILIS  Novel Coronavirus, NAA (hospital order; send-out to ref lab)     Status: None   Collection Time: 03/14/19  3:35 PM  Result Value Ref Range Status   SARS-CoV-2, NAA NOT DETECTED NOT DETECTED Final    Comment: (NOTE) This test was developed and its performance characteristics determined by [employer]. This test has not been FDA cleared or approved. This test has been authorized by FDA under an Emergency Use Authorization (EUA). This test is only authorized for the duration of time the declaration that circumstances exist justifying the authorization of the emergency use of in vitro diagnostic tests for detection of SARS-CoV-2 virus and/or diagnosis of COVID-19 infection under section 564(b)(1) of the Act, 21 U.S.C. 413KGM-0(N)(0), unless the authorization is terminated or revoked sooner. When diagnostic testing is negative, the possibility of a false negative result should be considered in the context of a patient's recent exposures and the presence of clinical signs and symptoms consistent with COVID-19. An individual without symptoms of COVID-19 and who is not shedding SARS-CoV-2 virus would expect to have a negative (not detected) result in this assay. Performed  At: [facility] [address] [place], [place] [id number] Rush Farmer MD IH:[id number]    Startup  Final    Comment: Performed at [facility], [place] [address] [place], [place] [address]     Labs: CBC: Recent Labs  Lab 03/14/19 0350 03/15/19 0500 03/16/19 0528 03/17/19 0430 03/18/19 0753  WBC 11.4* 8.4 7.9 11.3* 9.0  HGB 9.7* 8.7* 9.6* 9.9* 10.3*  HCT 35.4* 31.3* 34.1* 34.3* 35.5*  MCV 95.9 94.8 93.4 92.7 93.4  PLT 350 303 303 295 329   Basic Metabolic Panel: Recent Labs  Lab 03/14/19 0350 03/15/19 0500 03/16/19 0528 03/17/19 0430 03/18/19 0753  NA 139 142 143 141 137  K 3.5 2.8* 2.9* 3.3* 3.9  CL 108 104 102 99 96*  CO2 24 29 34* 34* 32  GLUCOSE 241* 241* 113* 159* 190*  BUN 12 10 11 17 17   CREATININE 0.74 0.81 0.67 0.95 0.84  CALCIUM 8.0* 8.2* 8.4* 8.5* 8.8*  MG 1.7 1.6* 1.6* 1.7  --    Liver Function Tests: Recent Labs  Lab 03/14/19 0350 03/16/19 0528  AST 12*  --   ALT 42  --   ALKPHOS 528*  --   BILITOT 1.0  --   PROT 5.3*  --   ALBUMIN 2.1* 2.8*   BNP (last 3 results) Recent Labs    12/22/18 1322 12/27/18 0429 03/10/19 2058  BNP 388.1* 114.3* 359.3*   CBG: Recent Labs  Lab 03/19/19 1055 03/19/19 1505 03/19/19 2051 03/20/19 0614 03/20/19 1114  GLUCAP 372* 327* 143* 158* 342*   Urinalysis    Component Value Date/Time   COLORURINE AMBER (A) 03/10/2019 0041   APPEARANCEUR CLEAR 03/10/2019 0041   LABSPEC 1.032 (H) 03/10/2019 0041   PHURINE 5.0 03/10/2019 0041   GLUCOSEU NEGATIVE 03/10/2019 0041   HGBUR SMALL (A) 03/10/2019 0041   BILIRUBINUR NEGATIVE 03/10/2019 0041   KETONESUR NEGATIVE 03/10/2019 0041   PROTEINUR 30 (A) 03/10/2019 0041   UROBILINOGEN 0.2 03/25/2015 2317   NITRITE POSITIVE (A) 03/10/2019 0041   LEUKOCYTESUR MODERATE (A) 03/10/2019 0041      Time coordinating discharge: 40 minutes  SIGNED:  Vernell Leep, MD, FACP, [facility]. [facility]  To contact the attending provider between 7A-7P or the covering provider during after hours 7P-7A, please log  into the web site [email or web address] and access using universal [place] password for that web site. If you do not  have the password, please call the hospital operator.

## 2019-03-20 NOTE — Discharge Instructions (Addendum)
Please get your medications reviewed and adjusted by your Primary MD. ° °Please request your Primary MD to go over all Hospital Tests and Procedure/Radiological results at the follow up, please get all Hospital records sent to your Prim MD by signing hospital release before you go home. ° °If you had Pneumonia of Lung problems at the Hospital: °Please get a 2 view Chest X ray done in 6-8 weeks after hospital discharge or sooner if instructed by your Primary MD. ° °If you have Congestive Heart Failure: °Please call your Cardiologist or Primary MD anytime you have any of the following symptoms:  °1) 3 pound weight gain in 24 hours or 5 pounds in 1 week  °2) shortness of breath, with or without a dry hacking cough  °3) swelling in the hands, feet or stomach  °4) if you have to sleep on extra pillows at night in order to breathe ° °Follow cardiac low salt diet and 1.5 lit/day fluid restriction. ° °If you have diabetes °Accuchecks 4 times/day, Once in AM empty stomach and then before each meal. °Log in all results and show them to your primary doctor at your next visit. °If any glucose reading is under 80 or above 300 call your primary MD immediately. ° °If you have Seizure/Convulsions/Epilepsy: °Please do not drive, operate heavy machinery, participate in activities at heights or participate in high speed sports until you have seen by Primary MD or a Neurologist and advised to do so again. ° °If you had Gastrointestinal Bleeding: °Please ask your Primary MD to check a complete blood count within one week of discharge or at your next visit. Your endoscopic/colonoscopic biopsies that are pending at the time of discharge, will also need to followed by your Primary MD. ° °Get Medicines reviewed and adjusted. °Please take all your medications with you for your next visit with your Primary MD ° °Please request your Primary MD to go over all hospital tests and procedure/radiological results at the follow up, please ask your  Primary MD to get all Hospital records sent to his/her office. ° °If you experience worsening of your admission symptoms, develop shortness of breath, life threatening emergency, suicidal or homicidal thoughts you must seek medical attention immediately by calling 911 or calling your MD immediately  if symptoms less severe. ° °You must read complete instructions/literature along with all the possible adverse reactions/side effects for all the Medicines you take and that have been prescribed to you. Take any new Medicines after you have completely understood and accpet all the possible adverse reactions/side effects.  ° °Do not drive or operate heavy machinery when taking Pain medications.  ° °Do not take more than prescribed Pain, Sleep and Anxiety Medications ° °Special Instructions: If you have smoked or chewed Tobacco  in the last 2 yrs please stop smoking, stop any regular Alcohol  and or any Recreational drug use. ° °Wear Seat belts while driving. ° °Please note °You were cared for by a hospitalist during your hospital stay. If you have any questions about your discharge medications or the care you received while you were in the hospital after you are discharged, you can call the unit and asked to speak with the hospitalist on call if the hospitalist that took care of you is not available. Once you are discharged, your primary care physician will handle any further medical issues. Please note that NO REFILLS for any discharge medications will be authorized once you are discharged, as it is imperative that you   return to your primary care physician (or establish a relationship with a primary care physician if you do not have one) for your aftercare needs so that they can reassess your need for medications and monitor your lab values.  You can reach the hospitalist office at phone [phone] or fax [phone]   If you do not have a primary care physician, you can call [phone] for a physician  referral.     Blood Glucose Monitoring, Adult Monitoring your blood sugar (glucose) is an important part of managing your diabetes (diabetes mellitus). Blood glucose monitoring involves checking your blood glucose as often as directed and keeping a record (log) of your results over time. Checking your blood glucose regularly and keeping a blood glucose log can:  Help you and your health care provider adjust your diabetes management plan as needed, including your medicines or insulin.  Help you understand how food, exercise, illnesses, and medicines affect your blood glucose.  Let you know what your blood glucose is at any time. You can quickly find out if you have low blood glucose (hypoglycemia) or high blood glucose (hyperglycemia). Your health care provider will set individualized treatment goals for you. Your goals will be based on your age, other medical conditions you have, and how you respond to diabetes treatment. Generally, the goal of treatment is to maintain the following blood glucose levels:  Before meals (preprandial): 80-130 mg/dL (4.4-7.2 mmol/L).  After meals (postprandial): below 180 mg/dL (10 mmol/L).  A1c level: less than 7%. Supplies needed:  Blood glucose meter.  Test strips for your meter. Each meter has its own strips. You must use the strips that came with your meter.  A needle to prick your finger (lancet). Do not use a lancet more than one time.  A device that holds the lancet (lancing device).  A journal or log book to write down your results. How to check your blood glucose  1. Wash your hands with soap and water. 2. Prick the side of your finger (not the tip) with the lancet. Use a different finger each time. 3. Gently rub the finger until a small drop of blood appears. 4. Follow instructions that come with your meter for inserting the test strip, applying blood to the strip, and using your blood glucose meter. 5. Write down your result and any  notes. Some meters allow you to use areas of your body other than your finger (alternative sites) to test your blood. The most common alternative sites are:  Forearm.  Thigh.  Palm of the hand. If you think you may have hypoglycemia, or if you have a history of not knowing when your blood glucose is getting low (hypoglycemia unawareness), do not use alternative sites. Use your finger instead. Alternative sites may not be as accurate as the fingers, because blood flow is slower in these areas. This means that the result you get may be delayed, and it may be different from the result that you would get from your finger. Follow these instructions at home: Blood glucose log   Every time you check your blood glucose, write down your result. Also write down any notes about things that may be affecting your blood glucose, such as your diet and exercise for the day. This information can help you and your health care provider: ? Look for patterns in your blood glucose over time. ? Adjust your diabetes management plan as needed.  Check if your meter allows you to download your records to a  computer. Most glucose meters store a record of glucose readings in the meter. If you have type 1 diabetes:  Check your blood glucose 2 or more times a day.  Also check your blood glucose: ? Before every insulin injection. ? Before and after exercise. ? Before meals. ? 2 hours after a meal. ? Occasionally between 2:00 a.m. and 3:00 a.m., as directed. ? Before potentially dangerous tasks, like driving or using heavy machinery. ? At bedtime.  You may need to check your blood glucose more often, up to 6-10 times a day, if you: ? Use an insulin pump. ? Need multiple daily injections (MDI). ? Have diabetes that is not well-controlled. ? Are ill. ? Have a history of severe hypoglycemia. ? Have hypoglycemia unawareness. If you have type 2 diabetes:  If you take insulin or other diabetes medicines, check your  blood glucose 2 or more times a day.  If you are on intensive insulin therapy, check your blood glucose 4 or more times a day. Occasionally, you may also need to check between 2:00 a.m. and 3:00 a.m., as directed.  Also check your blood glucose: ? Before and after exercise. ? Before potentially dangerous tasks, like driving or using heavy machinery.  You may need to check your blood glucose more often if: ? Your medicine is being adjusted. ? Your diabetes is not well-controlled. ? You are ill. General tips  Always keep your supplies with you.  If you have questions or need help, all blood glucose meters have a 24-hour "hotline" phone number that you can call. You may also contact your health care provider.  After you use a few boxes of test strips, adjust (calibrate) your blood glucose meter by following instructions that came with your meter. Contact a health care provider if:  Your blood glucose is at or above 240 mg/dL (13.3 mmol/L) for 2 days in a row.  You have been sick or have had a fever for 2 days or longer, and you are not getting better.  You have any of the following problems for more than 6 hours: ? You cannot eat or drink. ? You have nausea or vomiting. ? You have diarrhea. Get help right away if:  Your blood glucose is lower than 54 mg/dL (3 mmol/L).  You become confused or you have trouble thinking clearly.  You have difficulty breathing.  You have moderate or large ketone levels in your urine. Summary  Monitoring your blood sugar (glucose) is an important part of managing your diabetes (diabetes mellitus).  Blood glucose monitoring involves checking your blood glucose as often as directed and keeping a record (log) of your results over time.  Your health care provider will set individualized treatment goals for you. Your goals will be based on your age, other medical conditions you have, and how you respond to diabetes treatment.  Every time you check  your blood glucose, write down your result. Also write down any notes about things that may be affecting your blood glucose, such as your diet and exercise for the day. This information is not intended to replace advice given to you by your health care provider. Make sure you discuss any questions you have with your health care provider. Document Released: 11/02/2003 Document Revised: 09/10/2017 Document Reviewed: 04/10/2016 Elsevier Interactive Patient Education  2019 [place].   Blood Glucose Monitoring, Adult Monitoring your blood sugar (glucose) is an important part of managing your diabetes (diabetes mellitus). Blood glucose monitoring involves checking your blood  glucose as often as directed and keeping a record (log) of your results over time. Checking your blood glucose regularly and keeping a blood glucose log can:  Help you and your health care provider adjust your diabetes management plan as needed, including your medicines or insulin.  Help you understand how food, exercise, illnesses, and medicines affect your blood glucose.  Let you know what your blood glucose is at any time. You can quickly find out if you have low blood glucose (hypoglycemia) or high blood glucose (hyperglycemia). Your health care provider will set individualized treatment goals for you. Your goals will be based on your age, other medical conditions you have, and how you respond to diabetes treatment. Generally, the goal of treatment is to maintain the following blood glucose levels:  Before meals (preprandial): 80-130 mg/dL (4.4-7.2 mmol/L).  After meals (postprandial): below 180 mg/dL (10 mmol/L).  A1c level: less than 7%. Supplies needed:  Blood glucose meter.  Test strips for your meter. Each meter has its own strips. You must use the strips that came with your meter.  A needle to prick your finger (lancet). Do not use a lancet more than one time.  A device that holds the lancet (lancing  device).  A journal or log book to write down your results. How to check your blood glucose  6. Wash your hands with soap and water. 7. Prick the side of your finger (not the tip) with the lancet. Use a different finger each time. 8. Gently rub the finger until a small drop of blood appears. 9. Follow instructions that come with your meter for inserting the test strip, applying blood to the strip, and using your blood glucose meter. 10. Write down your result and any notes. Some meters allow you to use areas of your body other than your finger (alternative sites) to test your blood. The most common alternative sites are:  Forearm.  Thigh.  Palm of the hand. If you think you may have hypoglycemia, or if you have a history of not knowing when your blood glucose is getting low (hypoglycemia unawareness), do not use alternative sites. Use your finger instead. Alternative sites may not be as accurate as the fingers, because blood flow is slower in these areas. This means that the result you get may be delayed, and it may be different from the result that you would get from your finger. Follow these instructions at home: Blood glucose log   Every time you check your blood glucose, write down your result. Also write down any notes about things that may be affecting your blood glucose, such as your diet and exercise for the day. This information can help you and your health care provider: ? Look for patterns in your blood glucose over time. ? Adjust your diabetes management plan as needed.  Check if your meter allows you to download your records to a computer. Most glucose meters store a record of glucose readings in the meter. If you have type 1 diabetes:  Check your blood glucose 2 or more times a day.  Also check your blood glucose: ? Before every insulin injection. ? Before and after exercise. ? Before meals. ? 2 hours after a meal. ? Occasionally between 2:00 a.m. and 3:00 a.m., as  directed. ? Before potentially dangerous tasks, like driving or using heavy machinery. ? At bedtime.  You may need to check your blood glucose more often, up to 6-10 times a day, if you: ? Use an  insulin pump. ? Need multiple daily injections (MDI). ? Have diabetes that is not well-controlled. ? Are ill. ? Have a history of severe hypoglycemia. ? Have hypoglycemia unawareness. If you have type 2 diabetes:  If you take insulin or other diabetes medicines, check your blood glucose 2 or more times a day.  If you are on intensive insulin therapy, check your blood glucose 4 or more times a day. Occasionally, you may also need to check between 2:00 a.m. and 3:00 a.m., as directed.  Also check your blood glucose: ? Before and after exercise. ? Before potentially dangerous tasks, like driving or using heavy machinery.  You may need to check your blood glucose more often if: ? Your medicine is being adjusted. ? Your diabetes is not well-controlled. ? You are ill. General tips  Always keep your supplies with you.  If you have questions or need help, all blood glucose meters have a 24-hour "hotline" phone number that you can call. You may also contact your health care provider.  After you use a few boxes of test strips, adjust (calibrate) your blood glucose meter by following instructions that came with your meter. Contact a health care provider if:  Your blood glucose is at or above 240 mg/dL (13.3 mmol/L) for 2 days in a row.  You have been sick or have had a fever for 2 days or longer, and you are not getting better.  You have any of the following problems for more than 6 hours: ? You cannot eat or drink. ? You have nausea or vomiting. ? You have diarrhea. Get help right away if:  Your blood glucose is lower than 54 mg/dL (3 mmol/L).  You become confused or you have trouble thinking clearly.  You have difficulty breathing.  You have moderate or large ketone levels in your  urine. Summary  Monitoring your blood sugar (glucose) is an important part of managing your diabetes (diabetes mellitus).  Blood glucose monitoring involves checking your blood glucose as often as directed and keeping a record (log) of your results over time.  Your health care provider will set individualized treatment goals for you. Your goals will be based on your age, other medical conditions you have, and how you respond to diabetes treatment.  Every time you check your blood glucose, write down your result. Also write down any notes about things that may be affecting your blood glucose, such as your diet and exercise for the day. This information is not intended to replace advice given to you by your health care provider. Make sure you discuss any questions you have with your health care provider. Document Released: 11/02/2003 Document Revised: 09/10/2017 Document Reviewed: 04/10/2016 Elsevier Interactive Patient Education  2019 [employer].

## 2019-03-21 ENCOUNTER — Other Ambulatory Visit: Payer: Self-pay

## 2019-03-21 ENCOUNTER — Other Ambulatory Visit: Payer: Self-pay | Admitting: Pharmacist

## 2019-03-21 DIAGNOSIS — N183 Chronic kidney disease, stage 3 (moderate): Secondary | ICD-10-CM | POA: Diagnosis not present

## 2019-03-21 DIAGNOSIS — I4891 Unspecified atrial fibrillation: Secondary | ICD-10-CM | POA: Diagnosis not present

## 2019-03-21 DIAGNOSIS — C189 Malignant neoplasm of colon, unspecified: Secondary | ICD-10-CM | POA: Diagnosis not present

## 2019-03-21 DIAGNOSIS — Z483 Aftercare following surgery for neoplasm: Secondary | ICD-10-CM | POA: Diagnosis not present

## 2019-03-21 DIAGNOSIS — C787 Secondary malignant neoplasm of liver and intrahepatic bile duct: Secondary | ICD-10-CM | POA: Diagnosis not present

## 2019-03-21 DIAGNOSIS — M1990 Unspecified osteoarthritis, unspecified site: Secondary | ICD-10-CM | POA: Diagnosis not present

## 2019-03-21 DIAGNOSIS — Z433 Encounter for attention to colostomy: Secondary | ICD-10-CM | POA: Diagnosis not present

## 2019-03-21 DIAGNOSIS — I130 Hypertensive heart and chronic kidney disease with heart failure and stage 1 through stage 4 chronic kidney disease, or unspecified chronic kidney disease: Secondary | ICD-10-CM | POA: Diagnosis not present

## 2019-03-21 DIAGNOSIS — E1122 Type 2 diabetes mellitus with diabetic chronic kidney disease: Secondary | ICD-10-CM | POA: Diagnosis not present

## 2019-03-21 DIAGNOSIS — I5032 Chronic diastolic (congestive) heart failure: Secondary | ICD-10-CM | POA: Diagnosis not present

## 2019-03-21 NOTE — Consult Note (Signed)
Follow up:  0905 am:  Call received from [place], Alwyn Ren, regarding patient's follow up needs for medication reconciliation and management referral.  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  Chart review was not very clear/concise with regards to the patient's disposition and home health follow up.  Call placed to inpatient [facility] team that transitioned patient. Left messages for return call request.  9:48 am spoke with [facility] RNCM, Samantha, to see if there was any information regarding home health agency as [place] had called and patient was charted as having [facility] but was told they did not have the patient as active. She will follow up and get back with this Probation officer.  Aldona Bar called and states patient was transferred from [facility] to [facility] for home care needs.  9:58 am: Spoke with Kathee Polite and she will follow up with the [facility] area branch Jana Half.  10:08 am updated Alwyn Ren [facility] Pharmacist on information given.  For questions, please contact:  Natividad Brood, RN BSN [facility] Liaison  [phone] business mobile phone Toll free office [phone]  Fax number: [phone] [email or web address]

## 2019-03-21 NOTE — Patient Outreach (Addendum)
[facility]) Care Management  [place]   03/21/2019  Cassandra Allen [facility] 07-25-1946 [id number]  Reason for referral: Medication Assistance, Medication Reconciliation Post Discharge  Referral source: [facility] RN Current insurance: [employer]  PMHx includes but not limited to:  Hypertension, hyperlipidemia, type 2 diabetes, CHF, atrial fibrillation, metastatic colon cancer (s/p ofpartial colectomy, chemotherapy),s/p of colostomy,CKD stage III, depression,recent surgery complicated by intra-abdominal abscess that became infected then presented to hospital from [facility] abdominal pain, nausea and vomiting.   Outreach:  Successful telephone call with patient.  HIPAA identifiers verified.   Subjective:  Patient reports she is not doing well today because she cannot get her colostomy bags stick. She reported her colostomy was actively leaking. In addition,   Patient reports using a pill box as an adherence strategy.   Lab Results  Component Value Date   HGBA1C 6.8 (H) 03/11/2019    Lipid Panel     Component Value Date/Time   CHOL 91 03/11/2019 0238   TRIG 78 03/11/2019 0238   HDL 23 (L) 03/11/2019 0238   CHOLHDL 4.0 03/11/2019 0238   VLDL 16 03/11/2019 0238   LDLCALC 52 03/11/2019 0238    BP Readings from Last 3 Encounters:  03/20/19 125/77  03/07/19 130/74  03/05/19 134/78    Allergies  Allergen Reactions  . Metformin And Related Nausea And Vomiting  . Prednisone Nausea And Vomiting    Medications Reviewed Today    Reviewed by Belva Agee, CPhT (Pharmacy Technician) on 03/11/19 at [place]  Med List Status: Complete  Medication Order Taking? Sig Documenting Provider Last Dose Status Informant  acetaminophen (TYLENOL) 500 MG tablet [id number] Yes Take 500 mg by mouth every 4 (four) hours as needed for mild pain or fever.  [provider] 03/10/2019 Unknown time Active Nursing Home Medication Administration Guide (MAG)  apixaban  (ELIQUIS) 5 MG TABS tablet [id number] Yes TAKE 1 TABLET (5 MG TOTAL) BY MOUTH 2 (TWO) TIMES DAILY.  Patient taking differently:  Take 5 mg by mouth 2 (two) times daily.    [name], NP 03/10/2019 1700 Active Nursing Home Medication Administration Guide (MAG)  bisacodyl (DULCOLAX) 10 MG suppository [id number] Yes Place 10 mg rectally daily as needed for moderate constipation.  [provider] unk Active Nursing Home Medication Administration Guide (MAG)  dicyclomine (BENTYL) 10 MG capsule [id number] Yes Take 10 mg by mouth 3 (three) times daily. (DO NOT CRUSH) [provider] Past Week Unknown time Active Nursing Home Medication Administration Guide (MAG)  diltiazem (CARDIZEM CD) 240 MG 24 hr capsule [id number] Yes Take 1 capsule (240 mg total) by mouth daily. Elmarie Shiley, MD 03/10/2019 Unknown time Active Nursing Home Medication Administration Guide (MAG)  feeding supplement, ENSURE ENLIVE, (ENSURE ENLIVE) LIQD [id number] Yes Take 1 Bottle by mouth daily. [provider] 03/10/2019 Unknown time Active Nursing Home Medication Administration Guide (MAG)  ferrous sulfate (KP FERROUS SULFATE) 325 (65 FE) MG tablet [id number] Yes Take 325 mg by mouth daily with breakfast. [provider] 03/10/2019 Unknown time Active Nursing Home Medication Administration Guide (MAG)  furosemide (LASIX) 20 MG tablet [id number] Yes Take 20 mg by mouth See admin instructions. Give one by mouth daily as needed for wt gain of 3 lbs in 1 day or 5 lbs in 1 week  [provider] unk Active Nursing Home Medication Administration Guide (MAG)  Insulin NPH, Human,, Isophane, (HUMULIN N KWIKPEN) 100 UNIT/ML Mayer Masker [id number] Yes Inject 12 Units into the skin 2 (two) times  a day. INJECT 12 UNITS SUBCUTANEOUSLY BEFORE BREAKFAST AND EVENING MEALS  [provider] 03/09/2019 Active Nursing Home Medication Administration Guide (MAG)  magnesium hydroxide (MILK OF MAGNESIA) 400 MG/5ML  suspension [id number] Yes Take 30 mLs by mouth daily as needed for mild constipation. [provider] unk Active Nursing Home Medication Administration Guide (MAG)  Melatonin 3 MG TABS [id number] Yes Take 3 mg by mouth at bedtime. [provider] unk Active Nursing Home Medication Administration Guide (MAG)  Baruch Gouty [id number] Yes NSA Med Pass 120 ml po daily [provider] Past Week Unknown time Active Nursing Home Medication Administration Guide (MAG)  ondansetron (ZOFRAN) 8 MG tablet [id number] Yes Take 8 mg by mouth every 8 (eight) hours as needed for nausea. [provider] unk Active Nursing Home Medication Administration Guide (MAG)  oxybutynin (DITROPAN-XL) 10 MG 24 hr tablet [id number] Yes Take 10 mg by mouth daily. [provider] 03/10/2019 Unknown time Active Nursing Home Medication Administration Guide (MAG)           Med Note Isidor Holts   Fri Jan 10, 2019  3:17 PM)    sertraline (ZOLOFT) 100 MG tablet [id number] Yes Take 125 mg by mouth daily. TAKE 1 TABLET BY MOUTH ONCE DAILY (TAKE WITH 25MG TO=125MG) FOR DEPRESSION [provider] 03/10/2019 Unknown time Active Nursing Home Medication Administration Guide (MAG)  sertraline (ZOLOFT) 25 MG tablet [id number] Yes Take 125 mg by mouth daily. TAKE 1 TABLET BY MOUTH ONCE DAILY (TAKE WITH 100MG TO=125MG) FOR DEPRESSION  [provider] 03/10/2019 Unknown time Active Nursing Home Medication Administration Guide (MAG)  simethicone (INFANTS GAS RELIEF) 40 MG/0.6ML drops [id number] Yes Take 40 mg by mouth 4 (four) times daily as needed for flatulence. [provider] unk Active Nursing Home Medication Administration Guide (MAG)  simvastatin (ZOCOR) 20 MG tablet [id number] Yes Take 20 mg by mouth at bedtime. [provider] Past Week Unknown time Active Nursing Home Medication Administration Guide (MAG)  traZODone (DESYREL) 50 MG tablet [id number] Yes Take 50 mg by  mouth at bedtime.  [provider] Past Week Unknown time Active Nursing Home Medication Administration Guide (MAG)  Med List Note Luana Shu, [place], CPhT 01/31/19 1258): [facility] [phone]          Assessment: Medication review could not be completed at the time of my call because the patient did not know her medications and her son confirmed they had not picked up her medications yet.  Patient's son was asked to call me back after he picks up the patient's medications. He was sent a coupon for two free boxes of Levemir.  Medication changes from Discharge START taking: amoxicillin (AMOXIL) cephALEXin (KEFLEX) feeding supplement (PRO-STAT SUGAR FREE 64) Insulin Detemir (LEVEMIR) Start taking on: Mar 21, 2019 liraglutide (VICTOZA) metoprolol tartrate (LOPRESSOR) multivitamin with minerals Start taking on: Mar 21, 2019 pantoprazole (Protonix) sodium chloride traMADol (Ultram)  CHANGE how you take: feeding supplement (GLUCERNA SHAKE) furosemide (LASIX) sertraline (ZOLOFT) simvastatin (ZOCOR)  STOP taking: Bentyl 10 MG capsule bisacodyl 10 MG suppository (DULCOLAX) HumuLIN N KwikPen 100 UNIT/ML Kiwkpen Infants Gas Relief 40 MG/0.6ML drops magnesium hydroxide 400 MG/5ML suspension (MILK OF MAGNESIA) Melatonin 3 MG Tabs NON FORMULARY ondansetron 8 MG tablet (ZOFRAN) traZODone 50 MG tablet (DESYREL)   Patient reported having issues with her Colostomy bag.  A call was placed to Beaulah Dinning, RN --[facility] Liaison.    Cassandra Allen said the [place] for the patient was [facility].  Both the [place] and [employer] were called and both said they did NOT have the patient under their care.  Cassandra Allen was called back.  She discovered [facility] referred the patient to [facility].  Cassandra Allen said she would reach out to [facility].  Medication Assistance Findings:  Medication assistance needs identified: Levemir  and Victoza  Extra Help:  Not eligible for Extra Help Low Income Subsidy based on reported income and assets  Patient Assistance Programs: Victoza and Levemir  made by [place] requirement met: Yes o Out-of-pocket prescription expenditure met:   Yes - Patient has met application requirements to apply for this patient assistance program.     Additional medication assistance options reviewed with patient as warranted:  Visual merchandiser   Plan: . Will contact Natividad Brood regarding colostomy supplies and teaching.  . Will route note to [facility] for applications to be sent to the patient.  . Will follow-up in 1-2 business days.--to complete the full medication review. . Await call back from patient's son.  [name], PharmD, [place] Clinical Pharmacist [phone]

## 2019-03-24 ENCOUNTER — Other Ambulatory Visit: Payer: Self-pay | Admitting: Pharmacist

## 2019-03-24 ENCOUNTER — Encounter: Payer: Self-pay | Admitting: *Deleted

## 2019-03-24 ENCOUNTER — Other Ambulatory Visit: Payer: Self-pay | Admitting: Surgery

## 2019-03-24 ENCOUNTER — Ambulatory Visit: Payer: Self-pay | Admitting: Pharmacist

## 2019-03-24 DIAGNOSIS — K651 Peritoneal abscess: Secondary | ICD-10-CM

## 2019-03-24 NOTE — Patient Outreach (Signed)
[facility]) Care Management  03/24/2019  Cassandra Allen [facility] Apr 13, 1946 [id number]   Patient was called to complete a post discharge medication review. HIPAA identifiers were obtained.  Patient's son was called as well. He was not at home and could not do the review.  Patient was able to have her daughter- in-law bring her medication bag for a medication review.  Reason for referral: Medication Assistance, Medication Reconciliation Post Discharge  Referral source: [facility] RN Current insurance: [employer]  PMHx includes but not limited to:  Hypertension, hyperlipidemia, type 2 diabetes, CHF, atrial fibrillation, metastatic colon cancer (s/p ofpartial colectomy, chemotherapy),s/p of colostomy,CKD stage III, depression,recent surgery complicated by intra-abdominal abscess that became infected then presented to hospital from [facility] abdominal pain, nausea and vomiting.  Outreach:  Successful telephone call with patient and her daughter-in-law.  HIPAA identifiers verified.   Subjective:  Patient reports feeling "ok" she is adjusting with her colostomy bag.  Patient reports using a pill box and her caregiver as an adherence strategies.   Objective: Lab Results  Component Value Date   CREATININE 0.84 03/18/2019   CREATININE 0.95 03/17/2019   CREATININE 0.67 03/16/2019    Lab Results  Component Value Date   HGBA1C 6.8 (H) 03/11/2019    Lipid Panel     Component Value Date/Time   CHOL 91 03/11/2019 0238   TRIG 78 03/11/2019 0238   HDL 23 (L) 03/11/2019 0238   CHOLHDL 4.0 03/11/2019 0238   VLDL 16 03/11/2019 0238   LDLCALC 52 03/11/2019 0238    BP Readings from Last 3 Encounters:  03/20/19 125/77  03/07/19 130/74  03/05/19 134/78    Allergies  Allergen Reactions  . Metformin And Related Nausea And Vomiting  . Prednisone Nausea And Vomiting    Medications Reviewed Today    Reviewed by Cassandra Allen, [facility] (Pharmacist) on 03/24/19 at 1316  Med List  Status: <None>  Medication Order Taking? Sig Documenting Provider Last Dose Status Informant  acetaminophen (TYLENOL) 500 MG tablet [id number] Yes Take 500 mg by mouth every 4 (four) hours as needed for mild pain or fever.  [provider] Taking Active Nursing Home Medication Administration Guide (MAG)  Amino Acids-Protein Hydrolys (FEEDING SUPPLEMENT, PRO-STAT SUGAR FREE 64,) LIQD [id number] Yes Take 30 mLs by mouth 2 (two) times daily. Modena Jansky, MD Taking Active   amoxicillin (AMOXIL) 500 MG capsule [id number] Yes Take 1 capsule (500 mg total) by mouth 3 (three) times daily for 14 days. Modena Jansky, MD Taking Active   apixaban (ELIQUIS) 5 MG TABS tablet [id number] Yes Take 1 tablet (5 mg total) by mouth 2 (two) times daily. Modena Jansky, MD Taking Active   cephALEXin [facility]) 500 MG capsule [id number] Yes Take 1 capsule (500 mg total) by mouth 3 (three) times daily for 14 days. Modena Jansky, MD Taking Active   diltiazem (CARDIZEM CD) 240 MG 24 hr capsule [id number] Yes Take 1 capsule (240 mg total) by mouth daily. Modena Jansky, MD Taking Active   ferrous sulfate (KP FERROUS SULFATE) 325 (65 FE) MG tablet [id number] Yes Take 1 tablet (325 mg total) by mouth daily with breakfast. Modena Jansky, MD Taking Active   furosemide (LASIX) 20 MG tablet [id number] Yes Take 1 tablet (20 mg total) by mouth daily. May take an additional 1 tablet (20 mg) as needed for wt gain of 3 lbs in 1 day or 5 lbs in 1 week Hongalgi, Lenis Dickinson, MD Taking Active   Insulin  Detemir (LEVEMIR) 100 UNIT/ML Pen [id number] Yes Inject 16 Units into the skin daily. Modena Jansky, MD Taking Active   liraglutide (VICTOZA) 18 MG/3ML SOPN [id number] Yes Inject 0.3 mLs (1.8 mg total) into the skin every morning. Modena Jansky, MD Taking Active   metoprolol tartrate (LOPRESSOR) 25 MG tablet [id number] Yes Take 0.5 tablets (12.5 mg total) by mouth 2 (two) times daily. Modena Jansky, MD Taking Active    oxybutynin (DITROPAN-XL) 10 MG 24 hr tablet [id number] Yes Take 1 tablet (10 mg total) by mouth daily. Modena Jansky, MD Taking Active   pantoprazole (PROTONIX) 40 MG tablet [id number] Yes Take 1 tablet (40 mg total) by mouth daily for 30 days. Modena Jansky, MD Taking Active   sertraline (ZOLOFT) 100 MG tablet [id number] Yes Take 1 tablet (100 mg total) by mouth daily. TAKE 1 TABLET BY MOUTH ONCE DAILY (TAKE WITH 25MG  TO=125MG ) FOR DEPRESSION Hongalgi, Lenis Dickinson, MD Taking Active   sertraline (ZOLOFT) 25 MG tablet [id number] Yes Take 1 tablet (25 mg total) by mouth daily. TAKE 1 TABLET BY MOUTH ONCE DAILY (TAKE WITH 100MG  TO=125MG ) FOR DEPRESSION Hongalgi, Lenis Dickinson, MD Taking Active   simvastatin (ZOCOR) 10 MG tablet [id number] Yes Take 1 tablet (10 mg total) by mouth daily at 6 PM. Modena Jansky, MD Taking Active   sodium chloride 0.9 % injection [id number] Yes Inject 10 mLs into the vein daily. Flush each drain (two drains total) with 10 cc normal saline once daily Louk, Alexandra M, PA-C Taking Active   traMADol (ULTRAM) 50 MG tablet [id number] Yes Take 1 tablet (50 mg total) by mouth every 6 (six) hours as needed for moderate pain or severe pain. Modena Jansky, MD Taking Active   Med List Note Luciano Cutter, CPhT 01/31/19 1258): [facility] [phone]          Assessment:  ASSESSMENT: Date Discharged from Hospital: 03/20/2019 Date Medication Reconciliation Performed: 03/27/2019  Medications:  START taking: amoxicillin (AMOXIL) cephalexin (KEFLEX) feeding supplement (PRO-STAT SUGAR FREE 64) Insulin Detemir (LEVEMIR) Start taking on: Mar 21, 2019 liraglutide (VICTOZA) metoprolol tartrate (LOPRESSOR) multivitamin with minerals Start taking on: Mar 21, 2019 pantoprazole (Protonix) sodium chloride tramadol (Ultram)  CHANGE how you take: feeding supplement (GLUCERNA SHAKE) furosemide (LASIX) sertraline (ZOLOFT) simvastatin (ZOCOR)  STOP  taking: Bentyl 10 MG capsule bisacodyl 10 MG suppository (DULCOLAX) Humulin N KwikPen 100 UNIT/ML Kiwkpen Infants Gas Relief 40 MG/0.6ML drops magnesium hydroxide 400 MG/5ML suspension (MILK OF MAGNESIA) Melatonin 3 MG Tabs NON FORMULARY ondansetron 8 MG tablet (ZOFRAN) trazodone 50 MG tablet (DESYREL)  Patient was recently discharged from hospital and all medications have been reviewed.  Drugs sorted by system:  Neurologic/Psychologic: Sertraline,   Cardiovascular: Diltiazem, Metoprolol, Furosemide, Simvastatin, Eliquis  Gastrointestinal: Feeding Supplement, Pantoprazole,   Endocrine: Levemir, [place],   Pain: Acetaminophen, tramadol  Infectious Diseases: Amoxicillin, Cephalexin,   Genitourinary: Oxybutynin,   Vitamins/Minerals/Supplements: Ferrous Sulfate,   Miscellaneous: Sodium Chloride Injection (iv flush)   Medication Adherence Findings: Adherence Review  [x]  Excellent (no doses missed/week)     []  Good (no more than 1 dose missed/week)     []  Partial (2-3 doses missed/week) []  Poor (>3 doses missed/week)  Patient with good understanding of regimen and good understanding of indications.    Medication Assistance Findings:  Medication assistance needs identified: Eliquis, Levemir, Victoza  Extra Help:  Not eligible for Extra Help Low Income Subsidy based on reported income and assets  Patient was sent a [employer] application for [place] and Levemir last week. [facility] Certified Pharmacy Technician, Sharee Pimple Simcox was asked to send the patient an application for Eliquis as well.  Additional medication assistance options reviewed with patient as warranted:  Coupon and Discount pharmacy lists   Patient's care giver expressed concerns about getting the patient transportation to some of her upcoming provider appointments.  Patient has [employer] which has a transportation benefit. [facility] Social Worker, Geographical information systems officer was called for information. Amber said she  would reach out to the patient.  Plan: . Will follow-up in 7-10 business days.Cassandra Allen, PharmD, [place] Clinical Pharmacist [phone]

## 2019-03-25 ENCOUNTER — Other Ambulatory Visit: Payer: Self-pay | Admitting: Pharmacy Technician

## 2019-03-25 ENCOUNTER — Telehealth: Payer: Self-pay | Admitting: Hematology

## 2019-03-25 ENCOUNTER — Other Ambulatory Visit: Payer: Self-pay

## 2019-03-25 DIAGNOSIS — N183 Chronic kidney disease, stage 3 (moderate): Secondary | ICD-10-CM | POA: Diagnosis not present

## 2019-03-25 DIAGNOSIS — C189 Malignant neoplasm of colon, unspecified: Secondary | ICD-10-CM | POA: Diagnosis not present

## 2019-03-25 DIAGNOSIS — M1990 Unspecified osteoarthritis, unspecified site: Secondary | ICD-10-CM | POA: Diagnosis not present

## 2019-03-25 DIAGNOSIS — Z433 Encounter for attention to colostomy: Secondary | ICD-10-CM | POA: Diagnosis not present

## 2019-03-25 DIAGNOSIS — I130 Hypertensive heart and chronic kidney disease with heart failure and stage 1 through stage 4 chronic kidney disease, or unspecified chronic kidney disease: Secondary | ICD-10-CM | POA: Diagnosis not present

## 2019-03-25 DIAGNOSIS — Z483 Aftercare following surgery for neoplasm: Secondary | ICD-10-CM | POA: Diagnosis not present

## 2019-03-25 DIAGNOSIS — I5032 Chronic diastolic (congestive) heart failure: Secondary | ICD-10-CM | POA: Diagnosis not present

## 2019-03-25 DIAGNOSIS — E1122 Type 2 diabetes mellitus with diabetic chronic kidney disease: Secondary | ICD-10-CM | POA: Diagnosis not present

## 2019-03-25 DIAGNOSIS — C787 Secondary malignant neoplasm of liver and intrahepatic bile duct: Secondary | ICD-10-CM | POA: Diagnosis not present

## 2019-03-25 NOTE — Patient Outreach (Signed)
[facility]) Care Management  03/25/2019  Cassandra Allen [facility] [birth year] [id number]   Successful outreach to patient regarding social work referral for resources for colostomy supplies and transportation. Patient requested that BSW speak with her daughter-in-law.  Daughter-in-law reports that they intend to order colostomy supplies on [place] but were wondering if insurance will cover any of the cost if ordered from another supplier.  BSW informed her that [place] would need to be contacted regarding coverage.  BSW encouraged her to talk with [facility] provider about options that may be lower cost than [place].  BSW educated daughter-in-law about transportation through [place].  BSW provided her with contact information and informed her that transportation must be scheduled three days in advance.  BSW informed her about Nurse, children's through [place].  BSW provided her with number to call to complete pre-registration for service.  BSW encouraged her to inquire about whether or not they provide transport outside of [facility] because most of these county services do not.  She reported that they eventually intend to change her care to [facility] system since they are located in [place] and this will be easier for patient/family to manage.  BSW is closing case but provided daughter-in-law with my contact information and encouraged her to call if additional needs arise.  Cassandra Allen, BSW Social Worker [phone]

## 2019-03-25 NOTE — Telephone Encounter (Signed)
Called patient and left msg about appt scheduled per sch msg.

## 2019-03-25 NOTE — Patient Outreach (Signed)
[name]) Care Management  03/25/2019  Cassandra Allen [facility] July 07, 1946 [id number]                           Medication Assistance Referral  Referral From: [place]  Medication/Company: Levemir / [employer] Patient application portion:  Education officer, museum portion: Faxed  to Dr [name]  Medication/Company: Donna Bernard / [employer] Patient application portion:  Education officer, museum portion: Faxed  to [employer]  Medication/Company: Eliquis / [employer] Patient application portion:  Mailed Provider application portion: Faxed  to Dr [name]    Follow up:  Will follow up with patient in 5-10 business days to confirm application(s) have been received.  [name] P. Elin Seats, [place] Management [phone]

## 2019-03-26 DIAGNOSIS — M1990 Unspecified osteoarthritis, unspecified site: Secondary | ICD-10-CM | POA: Diagnosis not present

## 2019-03-26 DIAGNOSIS — I5032 Chronic diastolic (congestive) heart failure: Secondary | ICD-10-CM | POA: Diagnosis not present

## 2019-03-26 DIAGNOSIS — Z483 Aftercare following surgery for neoplasm: Secondary | ICD-10-CM | POA: Diagnosis not present

## 2019-03-26 DIAGNOSIS — E1122 Type 2 diabetes mellitus with diabetic chronic kidney disease: Secondary | ICD-10-CM | POA: Diagnosis not present

## 2019-03-26 DIAGNOSIS — I130 Hypertensive heart and chronic kidney disease with heart failure and stage 1 through stage 4 chronic kidney disease, or unspecified chronic kidney disease: Secondary | ICD-10-CM | POA: Diagnosis not present

## 2019-03-26 DIAGNOSIS — C787 Secondary malignant neoplasm of liver and intrahepatic bile duct: Secondary | ICD-10-CM | POA: Diagnosis not present

## 2019-03-26 DIAGNOSIS — Z433 Encounter for attention to colostomy: Secondary | ICD-10-CM | POA: Diagnosis not present

## 2019-03-26 DIAGNOSIS — N183 Chronic kidney disease, stage 3 (moderate): Secondary | ICD-10-CM | POA: Diagnosis not present

## 2019-03-26 DIAGNOSIS — C189 Malignant neoplasm of colon, unspecified: Secondary | ICD-10-CM | POA: Diagnosis not present

## 2019-03-27 DIAGNOSIS — Z433 Encounter for attention to colostomy: Secondary | ICD-10-CM | POA: Diagnosis not present

## 2019-03-27 DIAGNOSIS — I130 Hypertensive heart and chronic kidney disease with heart failure and stage 1 through stage 4 chronic kidney disease, or unspecified chronic kidney disease: Secondary | ICD-10-CM | POA: Diagnosis not present

## 2019-03-27 DIAGNOSIS — Z483 Aftercare following surgery for neoplasm: Secondary | ICD-10-CM | POA: Diagnosis not present

## 2019-03-27 DIAGNOSIS — M1990 Unspecified osteoarthritis, unspecified site: Secondary | ICD-10-CM | POA: Diagnosis not present

## 2019-03-27 DIAGNOSIS — E1122 Type 2 diabetes mellitus with diabetic chronic kidney disease: Secondary | ICD-10-CM | POA: Diagnosis not present

## 2019-03-27 DIAGNOSIS — I5032 Chronic diastolic (congestive) heart failure: Secondary | ICD-10-CM | POA: Diagnosis not present

## 2019-03-27 DIAGNOSIS — C787 Secondary malignant neoplasm of liver and intrahepatic bile duct: Secondary | ICD-10-CM | POA: Diagnosis not present

## 2019-03-27 DIAGNOSIS — C189 Malignant neoplasm of colon, unspecified: Secondary | ICD-10-CM | POA: Diagnosis not present

## 2019-03-27 DIAGNOSIS — N183 Chronic kidney disease, stage 3 (moderate): Secondary | ICD-10-CM | POA: Diagnosis not present

## 2019-03-28 DIAGNOSIS — I130 Hypertensive heart and chronic kidney disease with heart failure and stage 1 through stage 4 chronic kidney disease, or unspecified chronic kidney disease: Secondary | ICD-10-CM | POA: Diagnosis not present

## 2019-03-28 DIAGNOSIS — C787 Secondary malignant neoplasm of liver and intrahepatic bile duct: Secondary | ICD-10-CM | POA: Diagnosis not present

## 2019-03-28 DIAGNOSIS — M1990 Unspecified osteoarthritis, unspecified site: Secondary | ICD-10-CM | POA: Diagnosis not present

## 2019-03-28 DIAGNOSIS — I5032 Chronic diastolic (congestive) heart failure: Secondary | ICD-10-CM | POA: Diagnosis not present

## 2019-03-28 DIAGNOSIS — E1122 Type 2 diabetes mellitus with diabetic chronic kidney disease: Secondary | ICD-10-CM | POA: Diagnosis not present

## 2019-03-28 DIAGNOSIS — C189 Malignant neoplasm of colon, unspecified: Secondary | ICD-10-CM | POA: Diagnosis not present

## 2019-03-28 DIAGNOSIS — Z483 Aftercare following surgery for neoplasm: Secondary | ICD-10-CM | POA: Diagnosis not present

## 2019-03-28 DIAGNOSIS — N183 Chronic kidney disease, stage 3 (moderate): Secondary | ICD-10-CM | POA: Diagnosis not present

## 2019-03-28 DIAGNOSIS — Z433 Encounter for attention to colostomy: Secondary | ICD-10-CM | POA: Diagnosis not present

## 2019-03-31 DIAGNOSIS — Z483 Aftercare following surgery for neoplasm: Secondary | ICD-10-CM | POA: Diagnosis not present

## 2019-03-31 DIAGNOSIS — I5032 Chronic diastolic (congestive) heart failure: Secondary | ICD-10-CM | POA: Diagnosis not present

## 2019-03-31 DIAGNOSIS — I130 Hypertensive heart and chronic kidney disease with heart failure and stage 1 through stage 4 chronic kidney disease, or unspecified chronic kidney disease: Secondary | ICD-10-CM | POA: Diagnosis not present

## 2019-03-31 DIAGNOSIS — C787 Secondary malignant neoplasm of liver and intrahepatic bile duct: Secondary | ICD-10-CM | POA: Diagnosis not present

## 2019-03-31 DIAGNOSIS — M1990 Unspecified osteoarthritis, unspecified site: Secondary | ICD-10-CM | POA: Diagnosis not present

## 2019-03-31 DIAGNOSIS — N183 Chronic kidney disease, stage 3 (moderate): Secondary | ICD-10-CM | POA: Diagnosis not present

## 2019-03-31 DIAGNOSIS — Z433 Encounter for attention to colostomy: Secondary | ICD-10-CM | POA: Diagnosis not present

## 2019-03-31 DIAGNOSIS — C189 Malignant neoplasm of colon, unspecified: Secondary | ICD-10-CM | POA: Diagnosis not present

## 2019-03-31 DIAGNOSIS — E1122 Type 2 diabetes mellitus with diabetic chronic kidney disease: Secondary | ICD-10-CM | POA: Diagnosis not present

## 2019-04-01 DIAGNOSIS — E1165 Type 2 diabetes mellitus with hyperglycemia: Secondary | ICD-10-CM | POA: Diagnosis not present

## 2019-04-01 DIAGNOSIS — C787 Secondary malignant neoplasm of liver and intrahepatic bile duct: Secondary | ICD-10-CM | POA: Diagnosis not present

## 2019-04-01 DIAGNOSIS — Z794 Long term (current) use of insulin: Secondary | ICD-10-CM | POA: Diagnosis not present

## 2019-04-01 DIAGNOSIS — I130 Hypertensive heart and chronic kidney disease with heart failure and stage 1 through stage 4 chronic kidney disease, or unspecified chronic kidney disease: Secondary | ICD-10-CM | POA: Diagnosis not present

## 2019-04-01 DIAGNOSIS — K651 Peritoneal abscess: Secondary | ICD-10-CM | POA: Diagnosis not present

## 2019-04-01 DIAGNOSIS — C189 Malignant neoplasm of colon, unspecified: Secondary | ICD-10-CM | POA: Diagnosis not present

## 2019-04-01 DIAGNOSIS — I4891 Unspecified atrial fibrillation: Secondary | ICD-10-CM | POA: Diagnosis not present

## 2019-04-01 DIAGNOSIS — R5381 Other malaise: Secondary | ICD-10-CM | POA: Diagnosis not present

## 2019-04-01 DIAGNOSIS — F324 Major depressive disorder, single episode, in partial remission: Secondary | ICD-10-CM | POA: Diagnosis not present

## 2019-04-01 DIAGNOSIS — N183 Chronic kidney disease, stage 3 (moderate): Secondary | ICD-10-CM | POA: Diagnosis not present

## 2019-04-01 DIAGNOSIS — I5032 Chronic diastolic (congestive) heart failure: Secondary | ICD-10-CM | POA: Diagnosis not present

## 2019-04-01 DIAGNOSIS — Z483 Aftercare following surgery for neoplasm: Secondary | ICD-10-CM | POA: Diagnosis not present

## 2019-04-01 DIAGNOSIS — C186 Malignant neoplasm of descending colon: Secondary | ICD-10-CM | POA: Diagnosis not present

## 2019-04-01 DIAGNOSIS — Z433 Encounter for attention to colostomy: Secondary | ICD-10-CM | POA: Diagnosis not present

## 2019-04-01 DIAGNOSIS — M1990 Unspecified osteoarthritis, unspecified site: Secondary | ICD-10-CM | POA: Diagnosis not present

## 2019-04-01 DIAGNOSIS — E1122 Type 2 diabetes mellitus with diabetic chronic kidney disease: Secondary | ICD-10-CM | POA: Diagnosis not present

## 2019-04-02 ENCOUNTER — Telehealth: Payer: Self-pay

## 2019-04-02 ENCOUNTER — Telehealth: Payer: Self-pay | Admitting: Hematology

## 2019-04-02 DIAGNOSIS — E1122 Type 2 diabetes mellitus with diabetic chronic kidney disease: Secondary | ICD-10-CM | POA: Diagnosis not present

## 2019-04-02 DIAGNOSIS — M1990 Unspecified osteoarthritis, unspecified site: Secondary | ICD-10-CM | POA: Diagnosis not present

## 2019-04-02 DIAGNOSIS — C189 Malignant neoplasm of colon, unspecified: Secondary | ICD-10-CM | POA: Diagnosis not present

## 2019-04-02 DIAGNOSIS — I5032 Chronic diastolic (congestive) heart failure: Secondary | ICD-10-CM | POA: Diagnosis not present

## 2019-04-02 DIAGNOSIS — Z483 Aftercare following surgery for neoplasm: Secondary | ICD-10-CM | POA: Diagnosis not present

## 2019-04-02 DIAGNOSIS — N183 Chronic kidney disease, stage 3 (moderate): Secondary | ICD-10-CM | POA: Diagnosis not present

## 2019-04-02 DIAGNOSIS — I130 Hypertensive heart and chronic kidney disease with heart failure and stage 1 through stage 4 chronic kidney disease, or unspecified chronic kidney disease: Secondary | ICD-10-CM | POA: Diagnosis not present

## 2019-04-02 DIAGNOSIS — C787 Secondary malignant neoplasm of liver and intrahepatic bile duct: Secondary | ICD-10-CM | POA: Diagnosis not present

## 2019-04-02 DIAGNOSIS — Z433 Encounter for attention to colostomy: Secondary | ICD-10-CM | POA: Diagnosis not present

## 2019-04-02 NOTE — Telephone Encounter (Signed)
Per 5/20 schedule message cancelled 5/27 appointments. Per message patient will call to reschedule when she is mobile again.  Also cancelled 5/22 [facility] outreach patient phone call visit in error. Called [facility] network and spoke with [place] - department made aware. Per Alwyn Ren it's ok she will reach out to patient.

## 2019-04-02 NOTE — Telephone Encounter (Signed)
Patient called to cancel her appointments on 5/27 as she is not yet mobile.  Offered for this appointment to be a phone visit (per Dr. Burr Medico).  She agreed.  Lab was cancelled.

## 2019-04-03 ENCOUNTER — Other Ambulatory Visit: Payer: Medicare HMO

## 2019-04-03 ENCOUNTER — Other Ambulatory Visit: Payer: Self-pay | Admitting: Pharmacy Technician

## 2019-04-03 DIAGNOSIS — N183 Chronic kidney disease, stage 3 (moderate): Secondary | ICD-10-CM | POA: Diagnosis not present

## 2019-04-03 DIAGNOSIS — I5032 Chronic diastolic (congestive) heart failure: Secondary | ICD-10-CM | POA: Diagnosis not present

## 2019-04-03 DIAGNOSIS — Z483 Aftercare following surgery for neoplasm: Secondary | ICD-10-CM | POA: Diagnosis not present

## 2019-04-03 DIAGNOSIS — M1990 Unspecified osteoarthritis, unspecified site: Secondary | ICD-10-CM | POA: Diagnosis not present

## 2019-04-03 DIAGNOSIS — E1122 Type 2 diabetes mellitus with diabetic chronic kidney disease: Secondary | ICD-10-CM | POA: Diagnosis not present

## 2019-04-03 DIAGNOSIS — I130 Hypertensive heart and chronic kidney disease with heart failure and stage 1 through stage 4 chronic kidney disease, or unspecified chronic kidney disease: Secondary | ICD-10-CM | POA: Diagnosis not present

## 2019-04-03 DIAGNOSIS — C189 Malignant neoplasm of colon, unspecified: Secondary | ICD-10-CM | POA: Diagnosis not present

## 2019-04-03 DIAGNOSIS — Z433 Encounter for attention to colostomy: Secondary | ICD-10-CM | POA: Diagnosis not present

## 2019-04-03 DIAGNOSIS — C787 Secondary malignant neoplasm of liver and intrahepatic bile duct: Secondary | ICD-10-CM | POA: Diagnosis not present

## 2019-04-03 NOTE — Patient Outreach (Signed)
[facility]) Care Management  04/03/2019  Lovell Roe [facility] 1945-11-23 [id number]   Unsuccessful outreach call placed to patient in regards to [employer] application for Eliquis and [employer] application for Levemir and Victoza.  Unfortunately patient did not answer the phone, HIPAA compliant voicemail left.  Was calling patient to inquire if she has received the patient assistance applications that were mailed to her.  Will followup with patient in 3-7 business days if call is not returned.  Namon Villarin P. Cari Burgo, [place] Management [phone]

## 2019-04-04 ENCOUNTER — Ambulatory Visit: Payer: Medicare HMO | Admitting: Pharmacist

## 2019-04-04 ENCOUNTER — Other Ambulatory Visit: Payer: Self-pay | Admitting: Pharmacist

## 2019-04-04 NOTE — Patient Outreach (Addendum)
[facility]) Care Management  04/04/2019  Cameren Odwyer [facility] 05/12/1946 [id number]   Called patient to follow up. HIPAA identifiers were obtained. Patient reported feeling tired with all of the rain we have been having. She said she did not remember getting any patient assistance paperwork. She also said she did not remember being given the [place] Phone number to reach out to about transportation to appointments.  Patient was reassured and her daughter-in-law Anderson Malta was called. Anderson Malta confirmed the patient assistance applications were received and that she gave them to the patient. She said she would remind the patient about the forms and help her complete them.  The patient's upcoming appointments were reviewed with [facility]. She has a visit with Dr. Vicente Masson on 04/09/2019 and with Dr. Rayann Heman on 04/11/2019. Both appointments are virtual.  Plan: Call patient back in 4-6 weeks to follow up.  [name], PharmD, BCACP [facility] Clinical Pharmacist [phone]   Patient's daughter-in law called me back and requested that we re-send the applications that we sent to the patient because she cannot find them now.  A note will be sent to [facility], CPhT to resend the applications.   [name], PharmD, [place] Clinical Pharmacist [phone]

## 2019-04-04 NOTE — Progress Notes (Signed)
[place]   [phone]) [phone] [phone]   Clinic Follow up Note   Patient Care Team: [name], MD as PCP - General (Family Medicine) Thompson Grayer, MD as PCP - Cardiology (Cardiology) Thompson Grayer, MD as PCP - Electrophysiology (Cardiology) Ronnette Juniper, MD as Consulting Physician (Gastroenterology) Truitt Merle, MD as Consulting Physician (Medical Oncology) Newt Minion, MD as Consulting Physician (Orthopedic Surgery) Leighton Ruff, MD as Consulting Physician (Colon and Rectal Surgery) [name], [facility] (Pharmacist) [place]cox, Luiz Ochoa, CPhT as [place] [facility] (Pharmacy Technician)   I connected with Cassandra Allen on 04/09/2019 at  1:15 PM EDT by telephone visit and verified that I am speaking with the correct person using two identifiers.  I discussed the limitations, risks, security and privacy concerns of performing an evaluation and management service by telephone and the availability of in person appointments. I also discussed with the patient that there may be a patient responsible charge related to this service. The patient expressed understanding and agreed to proceed.   Patient's location:  Her home  Provider's location:  My Office   CHIEF COMPLAINT: F/u of colon cancer and IDA  SUMMARY OF ONCOLOGIC HISTORY:   Cancer of splenic flexure of colon   10/25/2018 Procedure    10/25/2018 Colonoscopy Impression: -Two 4 to 6 mm sessile polyps were found in the sigmoid colon. Resected and retrieved. -A 6 mm sessile polyp was found in the descending colon. Resected and retrieved. -Three 5 to 15 mm sessile polyps were found in the transverse colon. Resection and retrieval were complete. -A 12 mm sessile polyp was found in the cecum. Resection and retrieval were complete.  -A 20 mm sessile polyp was found in the ascending colon. Resection and retrieval were complete. . -A malignancy partially obstructing large mass  was found at 55 cm proximal to the anus. Biopsied and tattooed. -Malignant partially obstructing tumor 20 cm proximal to the anus. Biopsied and tattooed. -Diverticulosis in the sigmoid colon, in the descending colon and in the transverse colon.     10/25/2018 Imaging    10/25/2018 Endoscopy Impression: -Normal esophagus -Z-line regular 35 cm from the incisors. -Non-bleeding erosive gastropathy -Erythematous mucosa in the antrum. Biopsied. -Normal examined duodenum. Biopsied.    10/25/2018 Cancer Staging    Staging form: Colon and Rectum, AJCC 8th Edition - Clinical stage from 10/25/2018: Stage Unknown (cTX, cN0, cM1) - Signed by Truitt Merle, MD on 11/22/2018    10/30/2018 Initial Diagnosis    Cancer of left colon ([place])    10/30/2018 Initial Biopsy    Final Diagnosis: 10/30/18 1. Small intestine-Duedenum, Biospy:   Benign 2.Stomach-Antrum, Biospy:   Chronic inactive Gastritis 3. Large intestine-Sigmoid Colon, Polyp:   Tubular adenomas (2) 4. Large interstine-Descending Colon, Polyp:   Tubular adenoma with high grade dysplasia, suspicious for invasion.  5. Large intestine-transverse colon, Polyp:   Tubulovilous Adenomas (3).  6. Large Intestine-Cecum, Polp:   Tubular adenoma  7. Large Intestine-Ascending Colon, Polyp:  Tubulovilous Adenoma 8. Large intestine, Biopsy, Mass 55cm:  Invasive moderately differentiated adenocarcinoma.  9. Large intestine, biopsy, Mass 20cm:   Tubulovilous Adenoma    11/01/2018 Imaging    CT CAP W contrast 11/01/18  IMPRESSION: CT CHEST: 1. Right paratracheal adenopathy (immediately superior to the azygos vein) which short axis dimension of 1.2 cm. In the present clinical setting it is possible this is related to metastatic involvement. 2. Scattered pulmonary parenchymal changes none of which are highly suspicious for metastatic  disease. 3. Abnormal appearance of the thyroid gland with left lower lobe ill-defined mass spanning over 3 cm.  Thyroid ultrasound can be performed for further delineation. 4. Cardiomegaly. Coronary artery calcification. 5.  Aortic Atherosclerosis (ICD10-I70.0).  CT ABDOMEN PELVIS: 1. Numerous hepatic lesions suspicious for metastatic disease largest within the caudate lobe measuring up to 3.2 cm. 2. Gallbladder wall thickening. This may be related to increased right heart pressure or liver disease but could not exclude cholecystitis in the proper clinical setting. 3. Numerous splenic lesions which in the present clinical setting is suspicious for combination of metastatic disease and splenic cysts. 4. 3.2 cm left adrenal mass suspicious for metastatic disease. 5. Question sigmoid colon and possibly proximal descending colon mass. Rectosigmoid colon mass also not excluded. Correlation with colonoscopy results recommended. 6. Prominent number of colonic diverticula. Third spacing of fluid makes it difficult to evaluate for the possibility of diverticulitis. Radiopaque 1.5 cm structure within the right colon may be related to ingested foreign body. 7. Low-density fatty appearing structures in the external iliac region/pelvic sidewall bilaterally with adjacent low-density iliac lymph nodes and retroperitoneal lymph nodes raises possibility of low-density metastatic adenopathy. Prominent size portacaval lymph node which short axis dimension of 1.3 cm. PET-CT could be obtained for further delineation if clinically desired. 8. Gas within the urinary bladder may be related to recent manipulation. Clinical correlation recommended. 9. Left adnexal 2.4 cm cyst. This can be assessed with pelvic sonogram.    12/04/2018 - 12/18/2018 Chemotherapy    FOLFOX every 2 weeks starting 12/04/18. Stopped after cycle 2 on 12/18/18 due to SBO which resulted in left sigmoid colectomy. Unfortunately after surgery she developed post-op complications. She had an abcessed that required draininh in 02/2019.        12/07/2018  Imaging    MRI Abdomen 12/07/18  IMPRESSION: 1. The overall improvement with resolution of the number of the hepatic metastatic lesions. The dominant lesion in the caudate lobe is reduced in size from previous 4.0 by 3.3 cm to current 3.8 by 2.7 cm. 2. The splenic lesions are similar to prior and nonspecific. 3. 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 There is a cystic component laterally which is probably incidental rather than from a collision lesion. 4. Hepatic hemochromatosis. 5. 7 mm gallstone in the common bile duct compatible with choledocholithiasis. There also multiple gallstones in the gallbladder. 6.  Aortic Atherosclerosis (ICD10-I70.0). 7. Bosniak category 2 cyst in the right kidney upper pole.    12/27/2018 Surgery    LEFT SIGMOID COLECTOMY WITH HARTMANN POUCH AND END COLOSTOMY by Dr Hassell Done 12/27/18     12/27/2018 Pathology Results    Diagnosis Colon, segmental resection for tumor, distal transverse, descending, sigmoid - INVASIVE MODERATELY DIFFERENTIATED ADENOCARCINOMA, 5.0 CM, CIRCUMFERENTIALLY INVOLVING THE PROXIMAL DESCENDING COLON WITH ASSOCIATED LUMINAL OBSTRUCTION. SEE NOTE. - CARCINOMA INVADES INTO THE PERICOLONIC SOFT TISSUE. - RESECTION MARGINS ARE NEGATIVE FOR CARCINOMA. - NEGATIVE FOR LYMPHOVASCULAR OR PERINEURAL INVASION. - TWENTY-TWO BENIGN LYMPH NODES, NEGATIVE FOR CARCINOMA (0/22). - SEPARATE LARGE VILLOUS ADENOMA INVOLVING SIGMOID COLON, 5.0 CM, WITHOUT HIGH GRADE DYSPLASIA OR CARCINOMA. - NON-SPECIFIC CHANGES IN THE PROXIMAL COLON, CONSISTENT WITH DISTAL OBSTRUCTION. - SEE ONCOLOGY TABLE.      CURRENT THERAPY:  -IVIronSucrose as needed. -FOLFOX every 2 weeks startingon 12/04/18, stopped after cycle 2 due to bowel obstruction, hemicolectomy and post-op complications.   INTERVAL HISTORY:  Cassandra Allen is here for a follow up. She was able to identify herself by birth date. She  was recently hospitalized for abscess in  abdomen on 03/09/19. She notes she is doing fair. She notes she is getting PT and OT and speech. She is encouraged to work. She has Nurse visit her once a week. She has 2 draining tubes in place still. The old one is 73 months old. She empties it every 1-2 days.  She completed her antibiotics last week. She notes her pain is only when she changes her colostomy from skin irritation. She notes she is eating adequately. Her energy is fair, but she needs assistance moving around house and using bathroom. She denies black stool or bleeding.    REVIEW OF SYSTEMS:   Constitutional: Denies fevers, chills or abnormal weight loss Eyes: Denies blurriness of vision Ears, nose, mouth, throat, and face: Denies mucositis or sore throat Respiratory: Denies cough, dyspnea or wheezes Cardiovascular: Denies palpitation, chest discomfort or lower extremity swelling Gastrointestinal:  Denies nausea, heartburn or change in bowel habits Skin: Denies abnormal skin rashes Lymphatics: Denies new lymphadenopathy or easy bruising Neurological:Denies numbness, tingling or new weaknesses Behavioral/Psych: Mood is stable, no new changes  All other systems were reviewed with the patient and are negative.  MEDICAL HISTORY:  Past Medical History:  Diagnosis Date  . Acute diastolic heart failure ([place]) 12/22/2018  . Arthritis   . Atrial fibrillation, chronic 12/22/2018  . Cancer of left colon ([place]) 10/30/2018  . Cancer of sigmoid colon  12/27/2018  . Diabetes mellitus without complication ([place])   . Hypertension   . Obesity (BMI 30-39.9) 12/27/2018    SURGICAL HISTORY: Past Surgical History:  Procedure Laterality Date  . COLONOSCOPY  10/2018   Dr [name].  Large cancer at splenic flexure,  Bulky sigmoid colon mass, Numerous polyps  . Intra-abdominal abscess drainage  01/31/2019   Abscess drainage grew Enterococcus faecalis  . IR IMAGING GUIDED PORT INSERTION  11/20/2018  . LAPAROTOMY N/A 12/27/2018   Procedure: LEFT SIGMOID  COLECTOMY WITH HARTMANN POUCH AND END COLOSTOMY;  Surgeon: [name], MD;  Location: [facility];  Service: General;  Laterality: N/A;    I have reviewed the social history and family history with the patient and they are unchanged from previous note.  ALLERGIES:  is allergic to metformin and related and prednisone.  MEDICATIONS:  Current Outpatient Medications  Medication Sig Dispense Refill  . acetaminophen (TYLENOL) 500 MG tablet Take 500 mg by mouth every 4 (four) hours as needed for mild pain or fever.     . Amino Acids-Protein Hydrolys (FEEDING SUPPLEMENT, PRO-STAT SUGAR FREE 64,) LIQD Take 30 mLs by mouth 2 (two) times daily. 887 mL 0  . apixaban (ELIQUIS) 5 MG TABS tablet Take 1 tablet (5 mg total) by mouth 2 (two) times daily. 60 tablet 0  . diltiazem (CARDIZEM CD) 240 MG 24 hr capsule Take 1 capsule (240 mg total) by mouth daily. 30 capsule 0  . ferrous sulfate (KP FERROUS SULFATE) 325 (65 FE) MG tablet Take 1 tablet (325 mg total) by mouth daily with breakfast. 30 tablet 0  . furosemide (LASIX) 20 MG tablet Take 1 tablet (20 mg total) by mouth daily. May take an additional 1 tablet (20 mg) as needed for wt gain of 3 lbs in 1 day or 5 lbs in 1 week 30 tablet 0  . Insulin Detemir (LEVEMIR) 100 UNIT/ML Pen Inject 16 Units into the skin daily. 15 mL 0  . liraglutide (VICTOZA) 18 MG/3ML SOPN Inject 0.3 mLs (1.8 mg total) into the skin every morning. 5 pen  0  . metoprolol tartrate (LOPRESSOR) 25 MG tablet Take 0.5 tablets (12.5 mg total) by mouth 2 (two) times daily. 60 tablet 0  . oxybutynin (DITROPAN-XL) 10 MG 24 hr tablet Take 1 tablet (10 mg total) by mouth daily. 30 tablet 0  . pantoprazole (PROTONIX) 40 MG tablet Take 1 tablet (40 mg total) by mouth daily for 30 days. 30 tablet 0  . sertraline (ZOLOFT) 100 MG tablet Take 1 tablet (100 mg total) by mouth daily. TAKE 1 TABLET BY MOUTH ONCE DAILY (TAKE WITH 25MG  TO=125MG ) FOR DEPRESSION 30 tablet 0  . sertraline (ZOLOFT) 25 MG tablet  Take 1 tablet (25 mg total) by mouth daily. TAKE 1 TABLET BY MOUTH ONCE DAILY (TAKE WITH 100MG  TO=125MG ) FOR DEPRESSION 30 tablet 0  . simvastatin (ZOCOR) 10 MG tablet Take 1 tablet (10 mg total) by mouth daily at 6 PM. 30 tablet 0  . sodium chloride 0.9 % injection Inject 10 mLs into the vein daily. Flush each drain (two drains total) with 10 cc normal saline once daily 600 mL 1  . traMADol (ULTRAM) 50 MG tablet Take 1 tablet (50 mg total) by mouth every 6 (six) hours as needed for moderate pain or severe pain. 20 tablet 0   No current facility-administered medications for this visit.     PHYSICAL EXAMINATION: ECOG PERFORMANCE STATUS: 3 - Symptomatic, >50% confined to bed  No vitals taken today, Exam not performed today  LABORATORY DATA:  I have reviewed the data as listed CBC Latest Ref Rng & Units 03/18/2019 03/17/2019 03/16/2019  WBC 4.0 - 10.5 K/uL 9.0 11.3(H) 7.9  Hemoglobin 12.0 - 15.0 g/dL 10.3(L) 9.9(L) 9.6(L)  Hematocrit 36.0 - 46.0 % 35.5(L) 34.3(L) 34.1(L)  Platelets 150 - 400 K/uL 288 295 303     CMP Latest Ref Rng & Units 03/18/2019 03/17/2019 03/16/2019  Glucose 70 - 99 mg/dL 190(H) 159(H) 113(H)  BUN 8 - 23 mg/dL 17 17 11   Creatinine 0.44 - 1.00 mg/dL 0.84 0.95 0.67  Sodium 135 - 145 mmol/L 137 141 143  Potassium 3.5 - 5.1 mmol/L 3.9 3.3(L) 2.9(L)  Chloride 98 - 111 mmol/L 96(L) 99 102  CO2 22 - 32 mmol/L 32 34(H) 34(H)  Calcium 8.9 - 10.3 mg/dL 8.8(L) 8.5(L) 8.4(L)  Total Protein 6.5 - 8.1 g/dL - - -  Total Bilirubin 0.3 - 1.2 mg/dL - - -  Alkaline Phos 38 - 126 U/L - - -  AST 15 - 41 U/L - - -  ALT 0 - 44 U/L - - -      RADIOGRAPHIC STUDIES: I have personally reviewed the radiological images as listed and agreed with the findings in the report. No results found.   ASSESSMENT & PLAN:  Cassandra Allen is a 73 y.o. female with   1.Left colon Cancer,G2,TxNxcM1 -She was diagnosed withInvasive moderately differentiated adenocarcinomaof left colon in  10/2018.She presented with iron deficient anemia -She CT CAP from 11/01/18 showed several hepatic lesions suspicious for metastasis. Her subsequent liver biopsy from 11/20/18 was negativefor malignancy.I spoke with Dr. Jacelyn Grip didthebiopsy,her liver lesion was not very clear on the ultrasound, and was in a difficult spot for biopsy. -She started FOLFOX on 12/04/18. Chemo was stopped after cycle 2 due to bowel obstruction from her primary colon tumor which required urgent left sigmoid colectomy. Unfortunately after surgery she developed post-op complications. She had an abscess that required draining last month.  -She completed antibiotics and is recovering slowly, but on the right track. Her  draining tubes are ready for removal. She will continue PT, OT. She is eating adequately and black stool is resolved.  -I reviewed her surgical pathology which showed her tumor was removed completely, she had negative margins and node negative. Liver biopsy was not feasible during her colon surgery -However her prior 11/2018 MRI showed suspicion of liver lesion which is stable overall. Will do PET scan in the next 1-2 months  -F/u in 3 weeks virtually, if her PS improves well by then, will order a PET scan.    2. Severe anemia, iron deficientanemia and anemia of chronic disease -Currently on IV iron sucrose 200mg as needed. Previously had a severe reaction to Feraheme. BM biopsy was negative for MDS or malignancy. -Her 11/2018 MRI shows iron deposits in her liver, will give IV iron more sparingly to prevent iron overload.  3. HTN, AF -Continue medications and f/u with PCP. BO well controlled -She ison Eliquis -Will monitor closely while on chemotherapy. I encouraged her to monitor at home as well. -HTN has remained well controled.    4. DM, obesity -Currently on insulin and Lirraglutide. -f/u with PCP and monitor BG at home -Will monitor closely while on chemotherapy.  5. Social  Support  -If she cannot drive herself, her only transportation is her son who works often.  -If she needs help I will refer her SW to aid with transportationas needed.  -She plans to move in with her son in the near future.  -She is not currentlyworkingand is out on short term disability.    6.Goal of care discussion  -The patient understands the goal of care is palliativeif she does have metastatic cancer. -she is full code now   Plan -Virtual visit in 3 weeks, plan to order PET on next visit if she recovers well    No problem-specific Assessment & Plan notes found for this encounter.   No orders of the defined types were placed in this encounter.  I discussed the assessment and treatment plan with the patient. The patient was provided an opportunity to ask questions and all were answered. The patient agreed with the plan and demonstrated an understanding of the instructions.  The patient was advised to call back or seek an in-person evaluation if the symptoms worsen or if the condition fails to improve as anticipated.  I provided 15 minutes of non face-to-face telephone visit time during this encounter, and > 50% was spent counseling as documented under my assessment & plan.    Truitt Merle, MD 04/09/2019   I, Joslyn Devon, am acting as scribe for Truitt Merle, MD.   I have reviewed the above documentation for accuracy and completeness, and I agree with the above.

## 2019-04-07 DIAGNOSIS — Z483 Aftercare following surgery for neoplasm: Secondary | ICD-10-CM | POA: Diagnosis not present

## 2019-04-07 DIAGNOSIS — N183 Chronic kidney disease, stage 3 (moderate): Secondary | ICD-10-CM | POA: Diagnosis not present

## 2019-04-07 DIAGNOSIS — M1990 Unspecified osteoarthritis, unspecified site: Secondary | ICD-10-CM | POA: Diagnosis not present

## 2019-04-07 DIAGNOSIS — I130 Hypertensive heart and chronic kidney disease with heart failure and stage 1 through stage 4 chronic kidney disease, or unspecified chronic kidney disease: Secondary | ICD-10-CM | POA: Diagnosis not present

## 2019-04-07 DIAGNOSIS — C787 Secondary malignant neoplasm of liver and intrahepatic bile duct: Secondary | ICD-10-CM | POA: Diagnosis not present

## 2019-04-07 DIAGNOSIS — I5032 Chronic diastolic (congestive) heart failure: Secondary | ICD-10-CM | POA: Diagnosis not present

## 2019-04-07 DIAGNOSIS — E1122 Type 2 diabetes mellitus with diabetic chronic kidney disease: Secondary | ICD-10-CM | POA: Diagnosis not present

## 2019-04-07 DIAGNOSIS — Z433 Encounter for attention to colostomy: Secondary | ICD-10-CM | POA: Diagnosis not present

## 2019-04-07 DIAGNOSIS — C189 Malignant neoplasm of colon, unspecified: Secondary | ICD-10-CM | POA: Diagnosis not present

## 2019-04-08 ENCOUNTER — Other Ambulatory Visit: Payer: Medicare HMO

## 2019-04-08 ENCOUNTER — Telehealth: Payer: Self-pay | Admitting: Hematology

## 2019-04-08 ENCOUNTER — Other Ambulatory Visit: Payer: Self-pay | Admitting: Pharmacist

## 2019-04-08 DIAGNOSIS — I130 Hypertensive heart and chronic kidney disease with heart failure and stage 1 through stage 4 chronic kidney disease, or unspecified chronic kidney disease: Secondary | ICD-10-CM | POA: Diagnosis not present

## 2019-04-08 DIAGNOSIS — I5032 Chronic diastolic (congestive) heart failure: Secondary | ICD-10-CM | POA: Diagnosis not present

## 2019-04-08 DIAGNOSIS — M1990 Unspecified osteoarthritis, unspecified site: Secondary | ICD-10-CM | POA: Diagnosis not present

## 2019-04-08 DIAGNOSIS — Z483 Aftercare following surgery for neoplasm: Secondary | ICD-10-CM | POA: Diagnosis not present

## 2019-04-08 DIAGNOSIS — E1122 Type 2 diabetes mellitus with diabetic chronic kidney disease: Secondary | ICD-10-CM | POA: Diagnosis not present

## 2019-04-08 DIAGNOSIS — C787 Secondary malignant neoplasm of liver and intrahepatic bile duct: Secondary | ICD-10-CM | POA: Diagnosis not present

## 2019-04-08 DIAGNOSIS — C189 Malignant neoplasm of colon, unspecified: Secondary | ICD-10-CM | POA: Diagnosis not present

## 2019-04-08 DIAGNOSIS — N183 Chronic kidney disease, stage 3 (moderate): Secondary | ICD-10-CM | POA: Diagnosis not present

## 2019-04-08 DIAGNOSIS — Z433 Encounter for attention to colostomy: Secondary | ICD-10-CM | POA: Diagnosis not present

## 2019-04-08 NOTE — Patient Outreach (Addendum)
[facility]) Care Management  04/08/2019  Cassandra Allen July 11, 1946 [id number]   Patient's daughter-in-law, Cassandra Allen called and reported the patient had not been taking her medications as prescribed. HIPAA identifiers were obtained.  According to [facility], the patient had run completely out of metoprolol (which was just filled 03/20/2019 for a 30 day supply) and had been taking 2 Eliquis tablets twice daily instead of 1 tablet twice daily as prescribed.  Cassandra Allen reported they had already called the patient's provider.    Cassandra Allen reported the patient had some mental status changes but did not have any unusual brusing or bleeding. She also said the patient's blood pressure is checked daily by home health professionals and they had not been alerted of any low blood pressures or pulse readings.  Cassandra Allen wondered if she could get another refill on the metoprolol. She was sent a Good Rx coupon from their website since the prescription is most likely too early to be filled on the patient's insurance.  Cassandra Allen was not sure how long the patient had taken more Eliquis than prescribed.  We resent patient assistance paperwork to the patient today for Eliquis, Levemir and Victoza.   Pill packing was discussed. Since the patient is already a patient at [facility], the [facility] Multi [facility] program was discussed. Cassandra Allen said she would research the program as she worked for [employer] for >20 years and would call me if she had any issues.  Plan: Route note to patient's PCP Follow up in 3 business days.  [name], PharmD, [place] Clinical Pharmacist [phone]

## 2019-04-08 NOTE — Telephone Encounter (Signed)
Left voicemail to confirm appt and verify info. °

## 2019-04-09 ENCOUNTER — Encounter: Payer: Self-pay | Admitting: Hematology

## 2019-04-09 ENCOUNTER — Other Ambulatory Visit: Payer: Medicare HMO

## 2019-04-09 ENCOUNTER — Ambulatory Visit: Payer: Medicare HMO | Admitting: Hematology

## 2019-04-09 ENCOUNTER — Inpatient Hospital Stay: Payer: Medicare HMO | Attending: Hematology | Admitting: Hematology

## 2019-04-09 DIAGNOSIS — C185 Malignant neoplasm of splenic flexure: Secondary | ICD-10-CM

## 2019-04-09 DIAGNOSIS — I10 Essential (primary) hypertension: Secondary | ICD-10-CM

## 2019-04-09 DIAGNOSIS — E119 Type 2 diabetes mellitus without complications: Secondary | ICD-10-CM | POA: Diagnosis not present

## 2019-04-09 DIAGNOSIS — Z483 Aftercare following surgery for neoplasm: Secondary | ICD-10-CM | POA: Diagnosis not present

## 2019-04-09 DIAGNOSIS — Z794 Long term (current) use of insulin: Secondary | ICD-10-CM | POA: Diagnosis not present

## 2019-04-09 DIAGNOSIS — D508 Other iron deficiency anemias: Secondary | ICD-10-CM | POA: Diagnosis not present

## 2019-04-09 DIAGNOSIS — D500 Iron deficiency anemia secondary to blood loss (chronic): Secondary | ICD-10-CM

## 2019-04-09 DIAGNOSIS — I5032 Chronic diastolic (congestive) heart failure: Secondary | ICD-10-CM | POA: Diagnosis not present

## 2019-04-09 DIAGNOSIS — C189 Malignant neoplasm of colon, unspecified: Secondary | ICD-10-CM | POA: Diagnosis not present

## 2019-04-09 DIAGNOSIS — Z79899 Other long term (current) drug therapy: Secondary | ICD-10-CM | POA: Diagnosis not present

## 2019-04-09 DIAGNOSIS — E1122 Type 2 diabetes mellitus with diabetic chronic kidney disease: Secondary | ICD-10-CM | POA: Diagnosis not present

## 2019-04-09 DIAGNOSIS — Z433 Encounter for attention to colostomy: Secondary | ICD-10-CM | POA: Diagnosis not present

## 2019-04-09 DIAGNOSIS — I130 Hypertensive heart and chronic kidney disease with heart failure and stage 1 through stage 4 chronic kidney disease, or unspecified chronic kidney disease: Secondary | ICD-10-CM | POA: Diagnosis not present

## 2019-04-09 DIAGNOSIS — C787 Secondary malignant neoplasm of liver and intrahepatic bile duct: Secondary | ICD-10-CM | POA: Diagnosis not present

## 2019-04-09 DIAGNOSIS — M1990 Unspecified osteoarthritis, unspecified site: Secondary | ICD-10-CM | POA: Diagnosis not present

## 2019-04-09 DIAGNOSIS — N183 Chronic kidney disease, stage 3 (moderate): Secondary | ICD-10-CM | POA: Diagnosis not present

## 2019-04-10 ENCOUNTER — Telehealth: Payer: Self-pay

## 2019-04-10 ENCOUNTER — Telehealth: Payer: Self-pay | Admitting: Hematology

## 2019-04-10 DIAGNOSIS — I5032 Chronic diastolic (congestive) heart failure: Secondary | ICD-10-CM | POA: Diagnosis not present

## 2019-04-10 DIAGNOSIS — M1990 Unspecified osteoarthritis, unspecified site: Secondary | ICD-10-CM | POA: Diagnosis not present

## 2019-04-10 DIAGNOSIS — Z483 Aftercare following surgery for neoplasm: Secondary | ICD-10-CM | POA: Diagnosis not present

## 2019-04-10 DIAGNOSIS — N183 Chronic kidney disease, stage 3 (moderate): Secondary | ICD-10-CM | POA: Diagnosis not present

## 2019-04-10 DIAGNOSIS — C787 Secondary malignant neoplasm of liver and intrahepatic bile duct: Secondary | ICD-10-CM | POA: Diagnosis not present

## 2019-04-10 DIAGNOSIS — I130 Hypertensive heart and chronic kidney disease with heart failure and stage 1 through stage 4 chronic kidney disease, or unspecified chronic kidney disease: Secondary | ICD-10-CM | POA: Diagnosis not present

## 2019-04-10 DIAGNOSIS — Z433 Encounter for attention to colostomy: Secondary | ICD-10-CM | POA: Diagnosis not present

## 2019-04-10 DIAGNOSIS — E1122 Type 2 diabetes mellitus with diabetic chronic kidney disease: Secondary | ICD-10-CM | POA: Diagnosis not present

## 2019-04-10 DIAGNOSIS — C189 Malignant neoplasm of colon, unspecified: Secondary | ICD-10-CM | POA: Diagnosis not present

## 2019-04-10 NOTE — Telephone Encounter (Signed)
Spoke with pts son about pts appt on 04/11/19. Pts son was advise to check pts vitals prior to appt. Pts son stated he did not have any questions at this time.

## 2019-04-10 NOTE — Telephone Encounter (Signed)
Scheduled appt per 5/27 los.  Patient aware of appt date and time, and is aware is it a phone visit only.

## 2019-04-11 ENCOUNTER — Other Ambulatory Visit: Payer: Self-pay | Admitting: Pharmacist

## 2019-04-11 ENCOUNTER — Telehealth (INDEPENDENT_AMBULATORY_CARE_PROVIDER_SITE_OTHER): Payer: Medicare HMO | Admitting: Internal Medicine

## 2019-04-11 ENCOUNTER — Encounter: Payer: Self-pay | Admitting: Internal Medicine

## 2019-04-11 VITALS — BP 136/80 | Ht 62.0 in | Wt 164.0 lb

## 2019-04-11 DIAGNOSIS — I10 Essential (primary) hypertension: Secondary | ICD-10-CM

## 2019-04-11 DIAGNOSIS — I5032 Chronic diastolic (congestive) heart failure: Secondary | ICD-10-CM

## 2019-04-11 DIAGNOSIS — I4821 Permanent atrial fibrillation: Secondary | ICD-10-CM | POA: Diagnosis not present

## 2019-04-11 NOTE — Progress Notes (Signed)
Electrophysiology TeleHealth Note   Due to national recommendations of social distancing due to COVID 19, an audio/video telehealth visit is felt to be most appropriate for this patient at this time.  See MyChart message from today for the patient's consent to telehealth for [facility].   Date:  04/11/2019   ID:  Cassandra Allen, DOB 1946/01/02, MRN [id number]  Location: patient's home  Provider location: [address], [place]  Evaluation Performed: Follow-up visit  PCP:  [name], MD  Electrophysiologist:  Dr Rayann Heman  Chief Complaint:  afib  History of Present Illness:    Cassandra Allen is a 73 y.o. female who presents via audio/video conferencing for a telehealth visit today.  Since last being seen in our clinic, she has been diagnosed with colon CA.  She underwent resection which was complicated with abdominal abscess.  She still has drains in.  She is also s/p chemo.  She had anemia which has improved.  Today, she denies symptoms of palpitations, chest pain, shortness of breath,  lower extremity edema, dizziness, presyncope, or syncope.  The patient is otherwise without complaint today.  The patient denies symptoms of fevers, chills, cough, or new SOB worrisome for COVID 19.  Past Medical History:  Diagnosis Date  . Acute diastolic heart failure ([place]) 12/22/2018  . Arthritis   . Atrial fibrillation, chronic 12/22/2018  . Cancer of left colon ([place]) 10/30/2018  . Cancer of sigmoid colon  12/27/2018  . Diabetes mellitus without complication ([place])   . Hypertension   . Obesity (BMI 30-39.9) 12/27/2018    Past Surgical History:  Procedure Laterality Date  . COLONOSCOPY  10/2018   Dr [name].  Large cancer at splenic flexure,  Bulky sigmoid colon mass, Numerous polyps  . Intra-abdominal abscess drainage  01/31/2019   Abscess drainage grew Enterococcus faecalis  . IR IMAGING GUIDED PORT INSERTION  11/20/2018  . LAPAROTOMY N/A 12/27/2018   Procedure:  LEFT SIGMOID COLECTOMY WITH HARTMANN POUCH AND END COLOSTOMY;  Surgeon: [name], MD;  Location: [facility];  Service: General;  Laterality: N/A;    Current Outpatient Medications  Medication Sig Dispense Refill  . acetaminophen (TYLENOL) 500 MG tablet Take 500 mg by mouth every 4 (four) hours as needed for mild pain or fever.     . Amino Acids-Protein Hydrolys (FEEDING SUPPLEMENT, PRO-STAT SUGAR FREE 64,) LIQD Take 30 mLs by mouth 2 (two) times daily. (Patient taking differently: Take 30 mLs by mouth daily. ) 887 mL 0  . apixaban (ELIQUIS) 5 MG TABS tablet Take 1 tablet (5 mg total) by mouth 2 (two) times daily. 60 tablet 0  . diltiazem (CARDIZEM CD) 240 MG 24 hr capsule Take 1 capsule (240 mg total) by mouth daily. 30 capsule 0  . ferrous sulfate (KP FERROUS SULFATE) 325 (65 FE) MG tablet Take 1 tablet (325 mg total) by mouth daily with breakfast. 30 tablet 0  . furosemide (LASIX) 20 MG tablet Take 1 tablet (20 mg total) by mouth daily. May take an additional 1 tablet (20 mg) as needed for wt gain of 3 lbs in 1 day or 5 lbs in 1 week 30 tablet 0  . Insulin Detemir (LEVEMIR) 100 UNIT/ML Pen Inject 16 Units into the skin daily. 15 mL 0  . liraglutide (VICTOZA) 18 MG/3ML SOPN Inject 0.3 mLs (1.8 mg total) into the skin every morning. 5 pen 0  . metoprolol tartrate (LOPRESSOR) 25 MG tablet Take 0.5 tablets (12.5 mg total)  by mouth 2 (two) times daily. 60 tablet 0  . oxybutynin (DITROPAN-XL) 10 MG 24 hr tablet Take 1 tablet (10 mg total) by mouth daily. 30 tablet 0  . pantoprazole (PROTONIX) 40 MG tablet Take 1 tablet (40 mg total) by mouth daily for 30 days. 30 tablet 0  . sertraline (ZOLOFT) 100 MG tablet Take 1 tablet (100 mg total) by mouth daily. TAKE 1 TABLET BY MOUTH ONCE DAILY (TAKE WITH 25MG  TO=125MG ) FOR DEPRESSION 30 tablet 0  . sertraline (ZOLOFT) 25 MG tablet Take 1 tablet (25 mg total) by mouth daily. TAKE 1 TABLET BY MOUTH ONCE DAILY (TAKE WITH 100MG  TO=125MG ) FOR DEPRESSION 30 tablet  0  . simvastatin (ZOCOR) 10 MG tablet Take 1 tablet (10 mg total) by mouth daily at 6 PM. 30 tablet 0  . sodium chloride 0.9 % injection Inject 10 mLs into the vein daily. Flush each drain (two drains total) with 10 cc normal saline once daily 600 mL 1  . traMADol (ULTRAM) 50 MG tablet Take 1 tablet (50 mg total) by mouth every 6 (six) hours as needed for moderate pain or severe pain. 20 tablet 0   No current facility-administered medications for this visit.     Allergies:   Metformin and related and Prednisone   Social History:  The patient  reports that she has never smoked. She has never used smokeless tobacco. She reports that she does not drink alcohol or use drugs.   Family History:  The patient's  family history includes Heart attack in her father and mother.   ROS:  Please see the history of present illness.   All other systems are personally reviewed and negative.    Exam:    Vital Signs:  BP 136/80   Ht 5\' 2"  (1.575 m)   Wt 164 lb (74.4 kg)   BMI 30.00 kg/m   Elderly appearing, alert and conversant, regular work of breathing,  good skin color Eyes- anicteric, neuro- grossly intact, skin- no apparent rash or lesions or cyanosis, mouth- oral mucosa is pink   Labs/Other Tests and Data Reviewed:    Recent Labs: 01/03/2019: TSH 1.894 03/10/2019: B Natriuretic Peptide 359.3 03/14/2019: ALT 42 03/17/2019: Magnesium 1.7 03/18/2019: BUN 17; Creatinine, Ser 0.84; Hemoglobin 10.3; Platelets 288; Potassium 3.9; Sodium 137   Wt Readings from Last 3 Encounters:  04/11/19 164 lb (74.4 kg)  03/20/19 164 lb 0.4 oz (74.4 kg)  03/07/19 163 lb 1.6 oz (74 kg)     Other studies personally reviewed: Additional studies/ records that were reviewed today include: my prior note 01/2018  Review of the above records today demonstrates: as above Prior radiographs: CXR 03/17/2019- small L pleural effusion   ASSESSMENT & PLAN:    1.  Permanent atrial fibrillation Asymptomatic chads2vasc score is  4.  Continue anticoagulation (eliquis) and rate control  2. Morbid obesity Lifestyle modification encouraged  3. Colon CA Dx 12/19 Treatment ongoing  4. Anemia Severe, now improved Continue eliquis with close follow-up  5. HTN Stable No change required today  6. Chronic diastolic dysfunction Stable No change required today  7. COVID 19 screen The patient denies symptoms of COVID 19 at this time.  The importance of social distancing was discussed today.  Follow-up:  EP PA ([place]) every 6 months  Current medicines are reviewed at length with the patient today.   The patient does not have concerns regarding her medicines.  The following changes were made today:  none  Labs/ tests ordered  today include:  No orders of the defined types were placed in this encounter.   Patient Risk:  after full review of this patients clinical status, I feel that they are at moderate risk at this time.  Today, I have spent 20 minutes with the patient with telehealth technology discussing afib .    Army Fossa, MD  04/11/2019 9:05 AM     [facility] [address] [place] [address] [phone] (office) [phone] (fax)

## 2019-04-11 NOTE — Patient Outreach (Signed)
[name]) Care Management  04/11/2019  Cassandra Allen 04-29-1946 [id number]  Called patient's son (Cassandra Allen)  to follow up on medications. HIPAA identifiers were obtained. Cassandra Allen reported the patient was doing well today and has all of her medications with the exception of Victoza which is available for refill on Saturday.  Earlier in the week, the patient's daughter-in-law called to report she had taken more Eliquis and Metoprolol than she should have. Patient saw Dr. Rayann Heman today via video visit and did not have any medication changes.  Since the metoprolol had just been filled, she was given a "Good Rx" coupon to help with the early refill.  Spoke with Cassandra Allen briefly about the [facility] Multi Dose program and offered to help out in any way.  Plan: Follow up with the patient in 2 weeks.   [name], PharmD, [place] Clinical Pharmacist [phone]

## 2019-04-14 DIAGNOSIS — E1122 Type 2 diabetes mellitus with diabetic chronic kidney disease: Secondary | ICD-10-CM | POA: Diagnosis not present

## 2019-04-14 DIAGNOSIS — M1990 Unspecified osteoarthritis, unspecified site: Secondary | ICD-10-CM | POA: Diagnosis not present

## 2019-04-14 DIAGNOSIS — Z433 Encounter for attention to colostomy: Secondary | ICD-10-CM | POA: Diagnosis not present

## 2019-04-14 DIAGNOSIS — I130 Hypertensive heart and chronic kidney disease with heart failure and stage 1 through stage 4 chronic kidney disease, or unspecified chronic kidney disease: Secondary | ICD-10-CM | POA: Diagnosis not present

## 2019-04-14 DIAGNOSIS — N183 Chronic kidney disease, stage 3 (moderate): Secondary | ICD-10-CM | POA: Diagnosis not present

## 2019-04-14 DIAGNOSIS — C189 Malignant neoplasm of colon, unspecified: Secondary | ICD-10-CM | POA: Diagnosis not present

## 2019-04-14 DIAGNOSIS — Z483 Aftercare following surgery for neoplasm: Secondary | ICD-10-CM | POA: Diagnosis not present

## 2019-04-14 DIAGNOSIS — C787 Secondary malignant neoplasm of liver and intrahepatic bile duct: Secondary | ICD-10-CM | POA: Diagnosis not present

## 2019-04-14 DIAGNOSIS — I5032 Chronic diastolic (congestive) heart failure: Secondary | ICD-10-CM | POA: Diagnosis not present

## 2019-04-15 DIAGNOSIS — Z483 Aftercare following surgery for neoplasm: Secondary | ICD-10-CM | POA: Diagnosis not present

## 2019-04-15 DIAGNOSIS — Z433 Encounter for attention to colostomy: Secondary | ICD-10-CM | POA: Diagnosis not present

## 2019-04-15 DIAGNOSIS — E1122 Type 2 diabetes mellitus with diabetic chronic kidney disease: Secondary | ICD-10-CM | POA: Diagnosis not present

## 2019-04-15 DIAGNOSIS — C787 Secondary malignant neoplasm of liver and intrahepatic bile duct: Secondary | ICD-10-CM | POA: Diagnosis not present

## 2019-04-15 DIAGNOSIS — I5032 Chronic diastolic (congestive) heart failure: Secondary | ICD-10-CM | POA: Diagnosis not present

## 2019-04-15 DIAGNOSIS — N183 Chronic kidney disease, stage 3 (moderate): Secondary | ICD-10-CM | POA: Diagnosis not present

## 2019-04-15 DIAGNOSIS — M1990 Unspecified osteoarthritis, unspecified site: Secondary | ICD-10-CM | POA: Diagnosis not present

## 2019-04-15 DIAGNOSIS — C189 Malignant neoplasm of colon, unspecified: Secondary | ICD-10-CM | POA: Diagnosis not present

## 2019-04-15 DIAGNOSIS — I130 Hypertensive heart and chronic kidney disease with heart failure and stage 1 through stage 4 chronic kidney disease, or unspecified chronic kidney disease: Secondary | ICD-10-CM | POA: Diagnosis not present

## 2019-04-16 DIAGNOSIS — I5032 Chronic diastolic (congestive) heart failure: Secondary | ICD-10-CM | POA: Diagnosis not present

## 2019-04-16 DIAGNOSIS — C787 Secondary malignant neoplasm of liver and intrahepatic bile duct: Secondary | ICD-10-CM | POA: Diagnosis not present

## 2019-04-16 DIAGNOSIS — C189 Malignant neoplasm of colon, unspecified: Secondary | ICD-10-CM | POA: Diagnosis not present

## 2019-04-16 DIAGNOSIS — Z433 Encounter for attention to colostomy: Secondary | ICD-10-CM | POA: Diagnosis not present

## 2019-04-16 DIAGNOSIS — Z483 Aftercare following surgery for neoplasm: Secondary | ICD-10-CM | POA: Diagnosis not present

## 2019-04-16 DIAGNOSIS — I130 Hypertensive heart and chronic kidney disease with heart failure and stage 1 through stage 4 chronic kidney disease, or unspecified chronic kidney disease: Secondary | ICD-10-CM | POA: Diagnosis not present

## 2019-04-16 DIAGNOSIS — E1122 Type 2 diabetes mellitus with diabetic chronic kidney disease: Secondary | ICD-10-CM | POA: Diagnosis not present

## 2019-04-16 DIAGNOSIS — N183 Chronic kidney disease, stage 3 (moderate): Secondary | ICD-10-CM | POA: Diagnosis not present

## 2019-04-16 DIAGNOSIS — M1990 Unspecified osteoarthritis, unspecified site: Secondary | ICD-10-CM | POA: Diagnosis not present

## 2019-04-17 ENCOUNTER — Other Ambulatory Visit: Payer: Self-pay | Admitting: Pharmacy Technician

## 2019-04-17 DIAGNOSIS — Z433 Encounter for attention to colostomy: Secondary | ICD-10-CM | POA: Diagnosis not present

## 2019-04-17 DIAGNOSIS — N183 Chronic kidney disease, stage 3 (moderate): Secondary | ICD-10-CM | POA: Diagnosis not present

## 2019-04-17 DIAGNOSIS — C787 Secondary malignant neoplasm of liver and intrahepatic bile duct: Secondary | ICD-10-CM | POA: Diagnosis not present

## 2019-04-17 DIAGNOSIS — I130 Hypertensive heart and chronic kidney disease with heart failure and stage 1 through stage 4 chronic kidney disease, or unspecified chronic kidney disease: Secondary | ICD-10-CM | POA: Diagnosis not present

## 2019-04-17 DIAGNOSIS — Z483 Aftercare following surgery for neoplasm: Secondary | ICD-10-CM | POA: Diagnosis not present

## 2019-04-17 DIAGNOSIS — I5032 Chronic diastolic (congestive) heart failure: Secondary | ICD-10-CM | POA: Diagnosis not present

## 2019-04-17 DIAGNOSIS — C189 Malignant neoplasm of colon, unspecified: Secondary | ICD-10-CM | POA: Diagnosis not present

## 2019-04-17 DIAGNOSIS — M1990 Unspecified osteoarthritis, unspecified site: Secondary | ICD-10-CM | POA: Diagnosis not present

## 2019-04-17 DIAGNOSIS — E1122 Type 2 diabetes mellitus with diabetic chronic kidney disease: Secondary | ICD-10-CM | POA: Diagnosis not present

## 2019-04-17 NOTE — Patient Outreach (Signed)
[place] [facility]) Care Management  04/17/2019  Cassandra Allen [facility] Feb 11, 1946 [id number]   ADDENDUM  Incoming call received from patient's son Cassandra Allen in regards to his mom's patient assistance applications for Elquis thru [employer] and Levemir and Victoza thru [employer].  HIPAA identifiers verified. Cassandra Allen informed he was unsure if they have received the patient assistance applications in the mail. He informed his wife handles the mail but that he would check with her and call me or Katina back.  Will followup in 3-7 business days if call is not returned.  Broox Lonigro P. Joanthony Hamza, [place] Management [phone]

## 2019-04-17 NOTE — Patient Outreach (Signed)
[facility]) Care Management  04/17/2019  Cassandra Allen [facility] 1946/10/13 [id number]  Unsuccessful outreach call placed to patient and patient's daughter in law Cassandra Allen in regards to [name] application for Eliquis and [employer] for [employer] and Victoza.  Unfortunately patient and patient's daughter in law did not answer the phone. HIPAA compliant voicemail left.  Was calling to inquire if they have received the second mailing of the patient assistance applications. The 1st mailing was sent out on 03/25/2019 and the second mailing was sent out on 04/08/2019.  Will followup with 2nd outreach attempt in 3-5 business days if call is not returned.  Shanesha Bednarz P. Crystalina Stodghill, [place] Management [phone]

## 2019-04-22 DIAGNOSIS — Z483 Aftercare following surgery for neoplasm: Secondary | ICD-10-CM | POA: Diagnosis not present

## 2019-04-22 DIAGNOSIS — I130 Hypertensive heart and chronic kidney disease with heart failure and stage 1 through stage 4 chronic kidney disease, or unspecified chronic kidney disease: Secondary | ICD-10-CM | POA: Diagnosis not present

## 2019-04-22 DIAGNOSIS — Z433 Encounter for attention to colostomy: Secondary | ICD-10-CM | POA: Diagnosis not present

## 2019-04-22 DIAGNOSIS — N183 Chronic kidney disease, stage 3 (moderate): Secondary | ICD-10-CM | POA: Diagnosis not present

## 2019-04-22 DIAGNOSIS — C189 Malignant neoplasm of colon, unspecified: Secondary | ICD-10-CM | POA: Diagnosis not present

## 2019-04-22 DIAGNOSIS — C787 Secondary malignant neoplasm of liver and intrahepatic bile duct: Secondary | ICD-10-CM | POA: Diagnosis not present

## 2019-04-22 DIAGNOSIS — I5032 Chronic diastolic (congestive) heart failure: Secondary | ICD-10-CM | POA: Diagnosis not present

## 2019-04-22 DIAGNOSIS — E1122 Type 2 diabetes mellitus with diabetic chronic kidney disease: Secondary | ICD-10-CM | POA: Diagnosis not present

## 2019-04-22 DIAGNOSIS — M1990 Unspecified osteoarthritis, unspecified site: Secondary | ICD-10-CM | POA: Diagnosis not present

## 2019-04-23 DIAGNOSIS — C189 Malignant neoplasm of colon, unspecified: Secondary | ICD-10-CM | POA: Diagnosis not present

## 2019-04-23 DIAGNOSIS — E1122 Type 2 diabetes mellitus with diabetic chronic kidney disease: Secondary | ICD-10-CM | POA: Diagnosis not present

## 2019-04-23 DIAGNOSIS — M1990 Unspecified osteoarthritis, unspecified site: Secondary | ICD-10-CM | POA: Diagnosis not present

## 2019-04-23 DIAGNOSIS — Z433 Encounter for attention to colostomy: Secondary | ICD-10-CM | POA: Diagnosis not present

## 2019-04-23 DIAGNOSIS — I130 Hypertensive heart and chronic kidney disease with heart failure and stage 1 through stage 4 chronic kidney disease, or unspecified chronic kidney disease: Secondary | ICD-10-CM | POA: Diagnosis not present

## 2019-04-23 DIAGNOSIS — Z483 Aftercare following surgery for neoplasm: Secondary | ICD-10-CM | POA: Diagnosis not present

## 2019-04-23 DIAGNOSIS — N183 Chronic kidney disease, stage 3 (moderate): Secondary | ICD-10-CM | POA: Diagnosis not present

## 2019-04-23 DIAGNOSIS — I5032 Chronic diastolic (congestive) heart failure: Secondary | ICD-10-CM | POA: Diagnosis not present

## 2019-04-23 DIAGNOSIS — C787 Secondary malignant neoplasm of liver and intrahepatic bile duct: Secondary | ICD-10-CM | POA: Diagnosis not present

## 2019-04-24 DIAGNOSIS — E1122 Type 2 diabetes mellitus with diabetic chronic kidney disease: Secondary | ICD-10-CM | POA: Diagnosis not present

## 2019-04-24 DIAGNOSIS — C189 Malignant neoplasm of colon, unspecified: Secondary | ICD-10-CM | POA: Diagnosis not present

## 2019-04-24 DIAGNOSIS — Z483 Aftercare following surgery for neoplasm: Secondary | ICD-10-CM | POA: Diagnosis not present

## 2019-04-24 DIAGNOSIS — C787 Secondary malignant neoplasm of liver and intrahepatic bile duct: Secondary | ICD-10-CM | POA: Diagnosis not present

## 2019-04-24 DIAGNOSIS — M1990 Unspecified osteoarthritis, unspecified site: Secondary | ICD-10-CM | POA: Diagnosis not present

## 2019-04-24 DIAGNOSIS — I130 Hypertensive heart and chronic kidney disease with heart failure and stage 1 through stage 4 chronic kidney disease, or unspecified chronic kidney disease: Secondary | ICD-10-CM | POA: Diagnosis not present

## 2019-04-24 DIAGNOSIS — Z433 Encounter for attention to colostomy: Secondary | ICD-10-CM | POA: Diagnosis not present

## 2019-04-24 DIAGNOSIS — N183 Chronic kidney disease, stage 3 (moderate): Secondary | ICD-10-CM | POA: Diagnosis not present

## 2019-04-24 DIAGNOSIS — I5032 Chronic diastolic (congestive) heart failure: Secondary | ICD-10-CM | POA: Diagnosis not present

## 2019-04-25 ENCOUNTER — Other Ambulatory Visit: Payer: Self-pay | Admitting: Pharmacist

## 2019-04-25 ENCOUNTER — Ambulatory Visit: Payer: Self-pay | Admitting: Pharmacist

## 2019-04-25 DIAGNOSIS — D649 Anemia, unspecified: Secondary | ICD-10-CM | POA: Diagnosis not present

## 2019-04-25 DIAGNOSIS — N390 Urinary tract infection, site not specified: Secondary | ICD-10-CM | POA: Diagnosis not present

## 2019-04-25 DIAGNOSIS — N183 Chronic kidney disease, stage 3 (moderate): Secondary | ICD-10-CM | POA: Diagnosis not present

## 2019-04-25 DIAGNOSIS — E1121 Type 2 diabetes mellitus with diabetic nephropathy: Secondary | ICD-10-CM | POA: Diagnosis not present

## 2019-04-25 DIAGNOSIS — R441 Visual hallucinations: Secondary | ICD-10-CM | POA: Diagnosis not present

## 2019-04-25 DIAGNOSIS — N3281 Overactive bladder: Secondary | ICD-10-CM | POA: Diagnosis not present

## 2019-04-25 DIAGNOSIS — E876 Hypokalemia: Secondary | ICD-10-CM | POA: Diagnosis not present

## 2019-04-25 NOTE — Patient Outreach (Signed)
[place] [facility]) Care Management  04/25/2019  Cassandra Allen 28-Mar-1946 [id number]   Spoke with patient's son, [name]. HIPAA identifiers were obtained. [name] confirmed they received the patient assistance applications that were mailed to them and said they will fill it out soon.  Plan: Route to [employer], CPhT as an Pharmacist, hospital.   [name], PharmD, [place] Clinical Pharmacist [phone]

## 2019-04-30 DIAGNOSIS — N183 Chronic kidney disease, stage 3 (moderate): Secondary | ICD-10-CM | POA: Diagnosis not present

## 2019-04-30 DIAGNOSIS — I5032 Chronic diastolic (congestive) heart failure: Secondary | ICD-10-CM | POA: Diagnosis not present

## 2019-04-30 DIAGNOSIS — Z433 Encounter for attention to colostomy: Secondary | ICD-10-CM | POA: Diagnosis not present

## 2019-04-30 DIAGNOSIS — M1990 Unspecified osteoarthritis, unspecified site: Secondary | ICD-10-CM | POA: Diagnosis not present

## 2019-04-30 DIAGNOSIS — C787 Secondary malignant neoplasm of liver and intrahepatic bile duct: Secondary | ICD-10-CM | POA: Diagnosis not present

## 2019-04-30 DIAGNOSIS — E1122 Type 2 diabetes mellitus with diabetic chronic kidney disease: Secondary | ICD-10-CM | POA: Diagnosis not present

## 2019-04-30 DIAGNOSIS — C189 Malignant neoplasm of colon, unspecified: Secondary | ICD-10-CM | POA: Diagnosis not present

## 2019-04-30 DIAGNOSIS — Z483 Aftercare following surgery for neoplasm: Secondary | ICD-10-CM | POA: Diagnosis not present

## 2019-04-30 DIAGNOSIS — I130 Hypertensive heart and chronic kidney disease with heart failure and stage 1 through stage 4 chronic kidney disease, or unspecified chronic kidney disease: Secondary | ICD-10-CM | POA: Diagnosis not present

## 2019-05-01 NOTE — Progress Notes (Signed)
Cassandra Allen   Telephone:(336) [phone] Fax:(336) [phone]   Clinic Follow up Note   Patient Care Team: [name], MD as PCP - General (Family Medicine) Thompson Grayer, MD as PCP - Cardiology (Cardiology) Thompson Grayer, MD as PCP - Electrophysiology (Cardiology) Ronnette Juniper, MD as Consulting Physician (Gastroenterology) Truitt Merle, MD as Consulting Physician (Medical Oncology) Newt Minion, MD as Consulting Physician (Orthopedic Surgery) Leighton Ruff, MD as Consulting Physician (Colon and Rectal Surgery) [name], [facility] as [place] Management (Pharmacist) Simcox, Luiz Ochoa, CPhT as [place] [facility] Research scientist (life sciences))   I connected with Cassandra Allen on 05/02/2019 at 10:15 AM EDT by telephone visit and verified that I am speaking with the correct person using two identifiers.  I discussed the limitations, risks, security and privacy concerns of performing an evaluation and management service by telephone and the availability of in person appointments. I also discussed with the patient that there may be a patient responsible charge related to this service. The patient expressed understanding and agreed to proceed.   Other persons participating in the visit and their role in the encounter:  Her son   Patient's location:  Her home  Provider's location:  My Office   CHIEF COMPLAINT: F/u of colon cancer and IDA  SUMMARY OF ONCOLOGIC HISTORY: Oncology History Overview Note  Cancer Staging Cancer of splenic flexure of colon Staging form: Colon and Rectum, AJCC 8th Edition - Clinical stage from 10/25/2018: Stage Unknown (cTX, cN0, cM1) - Signed by Truitt Merle, MD on 11/22/2018 - Pathologic stage from 12/27/2018: pT3, pN0, cM1 - Signed by Truitt Merle, MD on 05/02/2019    Cancer of splenic flexure of colon  10/25/2018 Procedure   10/25/2018 Colonoscopy Impression: -Two 4 to 6 mm sessile polyps were found in the sigmoid  colon. Resected and retrieved. -A 6 mm sessile polyp was found in the descending colon. Resected and retrieved. -Three 5 to 15 mm sessile polyps were found in the transverse colon. Resection and retrieval were complete. -A 12 mm sessile polyp was found in the cecum. Resection and retrieval were complete.  -A 20 mm sessile polyp was found in the ascending colon. Resection and retrieval were complete. . -A malignancy partially obstructing large mass was found at 55 cm proximal to the anus. Biopsied and tattooed. -Malignant partially obstructing tumor 20 cm proximal to the anus. Biopsied and tattooed. -Diverticulosis in the sigmoid colon, in the descending colon and in the transverse colon.    10/25/2018 Imaging   10/25/2018 Endoscopy Impression: -Normal esophagus -Z-line regular 35 cm from the incisors. -Non-bleeding erosive gastropathy -Erythematous mucosa in the antrum. Biopsied. -Normal examined duodenum. Biopsied.   10/25/2018 Cancer Staging   Staging form: Colon and Rectum, AJCC 8th Edition - Clinical stage from 10/25/2018: Stage Unknown (cTX, cN0, cM1) - Signed by Truitt Merle, MD on 11/22/2018   10/30/2018 Initial Diagnosis   Cancer of left colon ([place])   10/30/2018 Initial Biopsy   Final Diagnosis: 10/30/18 1. Small intestine-Duedenum, Biospy:   Benign 2.Stomach-Antrum, Biospy:   Chronic inactive Gastritis 3. Large intestine-Sigmoid Colon, Polyp:   Tubular adenomas (2) 4. Large interstine-Descending Colon, Polyp:   Tubular adenoma with high grade dysplasia, suspicious for invasion.  5. Large intestine-transverse colon, Polyp:   Tubulovilous Adenomas (3).  6. Large Intestine-Cecum, Polp:   Tubular adenoma  7. Large Intestine-Ascending Colon, Polyp:  Tubulovilous Adenoma 8. Large intestine, Biopsy, Mass 55cm:  Invasive moderately differentiated adenocarcinoma.  9. Large intestine,  biopsy, Mass 20cm:   Tubulovilous Adenoma   11/01/2018 Imaging   CT CAP W contrast  11/01/18  IMPRESSION: CT CHEST: 1. Right paratracheal adenopathy (immediately superior to the azygos vein) which short axis dimension of 1.2 cm. In the present clinical setting it is possible this is related to metastatic involvement. 2. Scattered pulmonary parenchymal changes none of which are highly suspicious for metastatic disease. 3. Abnormal appearance of the thyroid gland with left lower lobe ill-defined mass spanning over 3 cm. Thyroid ultrasound can be performed for further delineation. 4. Cardiomegaly. Coronary artery calcification. 5.  Aortic Atherosclerosis (ICD10-I70.0).  CT ABDOMEN PELVIS: 1. Numerous hepatic lesions suspicious for metastatic disease largest within the caudate lobe measuring up to 3.2 cm. 2. Gallbladder wall thickening. This may be related to increased right heart pressure or liver disease but could not exclude cholecystitis in the proper clinical setting. 3. Numerous splenic lesions which in the present clinical setting is suspicious for combination of metastatic disease and splenic cysts. 4. 3.2 cm left adrenal mass suspicious for metastatic disease. 5. Question sigmoid colon and possibly proximal descending colon mass. Rectosigmoid colon mass also not excluded. Correlation with colonoscopy results recommended. 6. Prominent number of colonic diverticula. Third spacing of fluid makes it difficult to evaluate for the possibility of diverticulitis. Radiopaque 1.5 cm structure within the right colon may be related to ingested foreign body. 7. Low-density fatty appearing structures in the external iliac region/pelvic sidewall bilaterally with adjacent low-density iliac lymph nodes and retroperitoneal lymph nodes raises possibility of low-density metastatic adenopathy. Prominent size portacaval lymph node which short axis dimension of 1.3 cm. PET-CT could be obtained for further delineation if clinically desired. 8. Gas within the urinary bladder may be  related to recent manipulation. Clinical correlation recommended. 9. Left adnexal 2.4 cm cyst. This can be assessed with pelvic sonogram.   12/04/2018 - 12/18/2018 Chemotherapy   FOLFOX every 2 weeks starting 12/04/18. Stopped after cycle 2 on 12/18/18 due to SBO which resulted in left sigmoid colectomy. Unfortunately after surgery she developed post-op complications. She had an abcessed that required draininh in 02/2019.       12/07/2018 Imaging   MRI Abdomen 12/07/18  IMPRESSION: 1. The overall improvement with resolution of the number of the hepatic metastatic lesions. The dominant lesion in the caudate lobe is reduced in size from previous 4.0 by 3.3 cm to current 3.8 by 2.7 cm. 2. The splenic lesions are similar to prior and nonspecific. 3. 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 There is a cystic component laterally which is probably incidental rather than from a collision lesion. 4. Hepatic hemochromatosis. 5. 7 mm gallstone in the common bile duct compatible with choledocholithiasis. There also multiple gallstones in the gallbladder. 6.  Aortic Atherosclerosis (ICD10-I70.0). 7. Bosniak category 2 cyst in the right kidney upper pole.   12/27/2018 Surgery   LEFT SIGMOID COLECTOMY WITH HARTMANN POUCH AND END COLOSTOMY by Dr Hassell Done 12/27/18    12/27/2018 Pathology Results   Diagnosis Colon, segmental resection for tumor, distal transverse, descending, sigmoid - INVASIVE MODERATELY DIFFERENTIATED ADENOCARCINOMA, 5.0 CM, CIRCUMFERENTIALLY INVOLVING THE PROXIMAL DESCENDING COLON WITH ASSOCIATED LUMINAL OBSTRUCTION. SEE NOTE. - CARCINOMA INVADES INTO THE PERICOLONIC SOFT TISSUE. - RESECTION MARGINS ARE NEGATIVE FOR CARCINOMA. - NEGATIVE FOR LYMPHOVASCULAR OR PERINEURAL INVASION. - TWENTY-TWO BENIGN LYMPH NODES, NEGATIVE FOR CARCINOMA (0/22). - SEPARATE LARGE VILLOUS ADENOMA INVOLVING SIGMOID COLON, 5.0 CM, WITHOUT HIGH GRADE DYSPLASIA OR CARCINOMA. - NON-SPECIFIC CHANGES  IN THE PROXIMAL COLON, CONSISTENT WITH  DISTAL OBSTRUCTION. - SEE ONCOLOGY TABLE.   12/27/2018 Cancer Staging   Staging form: Colon and Rectum, AJCC 8th Edition - Pathologic stage from 12/27/2018: pT3, pN0, cM1 - Signed by Truitt Merle, MD on 05/02/2019      CURRENT THERAPY:  -IVIronSucrose as needed. -FOLFOX every 2 weeks startingon 12/04/18, stopped after cycle 2 due to bowel obstruction, hemicolectomy and post-op complications.   INTERVAL HISTORY:  IEESHA ABBASI is here for a follow up of IDA and colon cancer. She was able to identify herself by her birthdate. Her son feels she is doing well and able to ambulate well. The patient notes she is doing well. She feels she is walking with walker but gets tired soon after. She can walk from her bedroom to kitchen and cook or empty dishwasher. She denies falling lately. She notes her appetite is doing good. She has not been really having abdominal pain. She still has her draining tubes in she had little to no drainage. Last draining was last night, she does not measure how much she drains. She notes she fluid was yellowish-green. She plans to get it removed once she is more mobile. She has completed PT but wants to do more.  She notes pin prick pain of right abdomen near her colostomy site. This pain is minimal. She notes she still has port and has not been flushed since her surgery.      REVIEW OF SYSTEMS:   Constitutional: Denies fevers, chills or abnormal weight loss Eyes: Denies blurriness of vision Ears, nose, mouth, throat, and face: Denies mucositis or sore throat Respiratory: Denies cough, dyspnea or wheezes Cardiovascular: Denies palpitation, chest discomfort or lower extremity swelling Gastrointestinal:  Denies nausea, heartburn or change in bowel habits (+) pin prick right abdominal pain  Skin: Denies abnormal skin rashes Lymphatics: Denies new lymphadenopathy or easy bruising Neurological:Denies numbness, tingling or new  weaknesses Behavioral/Psych: Mood is stable, no new changes  All other systems were reviewed with the patient and are negative.  MEDICAL HISTORY:  Past Medical History:  Diagnosis Date  . Acute diastolic heart failure ([place]) 12/22/2018  . Arthritis   . Atrial fibrillation, chronic 12/22/2018  . Cancer of left colon ([place]) 10/30/2018  . Cancer of sigmoid colon  12/27/2018  . Diabetes mellitus without complication ([place])   . Hypertension   . Obesity (BMI 30-39.9) 12/27/2018    SURGICAL HISTORY: Past Surgical History:  Procedure Laterality Date  . COLONOSCOPY  10/2018   Dr [name].  Large cancer at splenic flexure,  Bulky sigmoid colon mass, Numerous polyps  . Intra-abdominal abscess drainage  01/31/2019   Abscess drainage grew Enterococcus faecalis  . IR IMAGING GUIDED PORT INSERTION  11/20/2018  . LAPAROTOMY N/A 12/27/2018   Procedure: LEFT SIGMOID COLECTOMY WITH HARTMANN POUCH AND END COLOSTOMY;  Surgeon: [name], MD;  Location: [facility];  Service: General;  Laterality: N/A;    I have reviewed the social history and family history with the patient and they are unchanged from previous note.  ALLERGIES:  is allergic to metformin and related and prednisone.  MEDICATIONS:  Current Outpatient Medications  Medication Sig Dispense Refill  . acetaminophen (TYLENOL) 500 MG tablet Take 500 mg by mouth every 4 (four) hours as needed for mild pain or fever.     . Amino Acids-Protein Hydrolys (FEEDING SUPPLEMENT, PRO-STAT SUGAR FREE 64,) LIQD Take 30 mLs by mouth 2 (two) times daily. (Patient taking differently: Take 30 mLs by mouth daily. ) 887 mL 0  .  apixaban (ELIQUIS) 5 MG TABS tablet Take 1 tablet (5 mg total) by mouth 2 (two) times daily. 60 tablet 0  . diltiazem (CARDIZEM CD) 240 MG 24 hr capsule Take 1 capsule (240 mg total) by mouth daily. 30 capsule 0  . ferrous sulfate (KP FERROUS SULFATE) 325 (65 FE) MG tablet Take 1 tablet (325 mg total) by mouth daily with breakfast. 30 tablet 0  .  furosemide (LASIX) 20 MG tablet Take 1 tablet (20 mg total) by mouth daily. May take an additional 1 tablet (20 mg) as needed for wt gain of 3 lbs in 1 day or 5 lbs in 1 week 30 tablet 0  . Insulin Detemir (LEVEMIR) 100 UNIT/ML Pen Inject 16 Units into the skin daily. 15 mL 0  . liraglutide (VICTOZA) 18 MG/3ML SOPN Inject 0.3 mLs (1.8 mg total) into the skin every morning. 5 pen 0  . metoprolol tartrate (LOPRESSOR) 25 MG tablet Take 0.5 tablets (12.5 mg total) by mouth 2 (two) times daily. 60 tablet 0  . oxybutynin (DITROPAN-XL) 10 MG 24 hr tablet Take 1 tablet (10 mg total) by mouth daily. 30 tablet 0  . pantoprazole (PROTONIX) 40 MG tablet Take 1 tablet (40 mg total) by mouth daily for 30 days. 30 tablet 0  . sertraline (ZOLOFT) 100 MG tablet Take 1 tablet (100 mg total) by mouth daily. TAKE 1 TABLET BY MOUTH ONCE DAILY (TAKE WITH 25MG  TO=125MG ) FOR DEPRESSION 30 tablet 0  . sertraline (ZOLOFT) 25 MG tablet Take 1 tablet (25 mg total) by mouth daily. TAKE 1 TABLET BY MOUTH ONCE DAILY (TAKE WITH 100MG  TO=125MG ) FOR DEPRESSION 30 tablet 0  . simvastatin (ZOCOR) 10 MG tablet Take 1 tablet (10 mg total) by mouth daily at 6 PM. 30 tablet 0  . sodium chloride 0.9 % injection Inject 10 mLs into the vein daily. Flush each drain (two drains total) with 10 cc normal saline once daily 600 mL 1  . traMADol (ULTRAM) 50 MG tablet Take 1 tablet (50 mg total) by mouth every 6 (six) hours as needed for moderate pain or severe pain. 20 tablet 0   No current facility-administered medications for this visit.     PHYSICAL EXAMINATION: ECOG PERFORMANCE STATUS: 3 - Symptomatic, >50% confined to bed  No vitals taken today, Exam not performed today   LABORATORY DATA:  I have reviewed the data as listed CBC Latest Ref Rng & Units 03/18/2019 03/17/2019 03/16/2019  WBC 4.0 - 10.5 K/uL 9.0 11.3(H) 7.9  Hemoglobin 12.0 - 15.0 g/dL 10.3(L) 9.9(L) 9.6(L)  Hematocrit 36.0 - 46.0 % 35.5(L) 34.3(L) 34.1(L)  Platelets 150 - 400  K/uL 288 295 303     CMP Latest Ref Rng & Units 03/18/2019 03/17/2019 03/16/2019  Glucose 70 - 99 mg/dL 190(H) 159(H) 113(H)  BUN 8 - 23 mg/dL 17 17 11   Creatinine 0.44 - 1.00 mg/dL 0.84 0.95 0.67  Sodium 135 - 145 mmol/L 137 141 143  Potassium 3.5 - 5.1 mmol/L 3.9 3.3(L) 2.9(L)  Chloride 98 - 111 mmol/L 96(L) 99 102  CO2 22 - 32 mmol/L 32 34(H) 34(H)  Calcium 8.9 - 10.3 mg/dL 8.8(L) 8.5(L) 8.4(L)  Total Protein 6.5 - 8.1 g/dL - - -  Total Bilirubin 0.3 - 1.2 mg/dL - - -  Alkaline Phos 38 - 126 U/L - - -  AST 15 - 41 U/L - - -  ALT 0 - 44 U/L - - -      RADIOGRAPHIC STUDIES: I have personally  reviewed the radiological images as listed and agreed with the findings in the report. No results found.   ASSESSMENT & PLAN:  Cassandra Allen is a 73 y.o. female with   1.Left colon Cancer,G1,pT3N0cMx with probable liver metastasis  -She was diagnosed withInvasive moderately differentiated adenocarcinomaof left colon in 10/2018.She presented with iron deficient anemia -She CT CAP from 11/01/18 showed several hepatic lesions suspicious for metastasis. Her subsequent liver biopsy from 11/20/18 was negativefor malignancy.I spoke with Dr. Jacelyn Grip didthebiopsy,her liver lesion was not very clear on the ultrasound, and was in a difficult spot for biopsy. -She started FOLFOX on 12/04/18. Chemo was stopped after cycle 2 due to bowel obstruction from her primary colon tumor which required urgent left sigmoid colectomy. Unfortunately after surgery she developed post-op complications. She had an abscess that required draining in 02/2019.  -Her tumor was removed completely, she had negative margins and node negative. Liver biopsy was not feasible during her colon surgery -she is recovering from surgery slowly -She still has two draining tubes in place and is starting to drain less. She plans to have them removed once  she is more mobile. She has completed PT, but would like to continue.  -Plan  for PET scan in next 1-2 months once draining tubes are removed.  -F/u in July with port flush.   2. Severe anemia, iron deficientanemia and anemia of chronic disease -she was on IV iron sucrose 200mg as needed. Previously had a severe reaction to Feraheme. BM biopsy was negative for MDS or malignancy. -Her 11/2018 MRI shows iron deposits in her liver, will give IV iron more sparingly to prevent iron overload. -resolved now since had hemicolectomy   3. HTN, AF -Continue medications and f/u with PCP. BO well controlled -She ison Eliquis -Will monitor closely while on chemotherapy. I encouraged her to monitor at home as well. -HTN has remained well controled.   4. DM, obesity -Currently on insulin and Lirraglutide. -f/u with PCP and monitor BG at home -Will monitor closely while on chemotherapy.  5. Social Support  -If she cannot drive herself, her only transportation is her son who works often.  -If she needs help I will refer her SW to aid with transportationas needed.  -She plans to move in with her son in the near future.  -She is not currentlyworkingand is out on short term disability.    6.Goal of care discussion  -The patient understands the goal of care is palliativeif she does have metastatic cancer. -she is full code now   Plan -Lab, flush and f/u in early July, will order PET scan on next visit    No problem-specific Assessment & Plan notes found for this encounter.   No orders of the defined types were placed in this encounter.  I discussed the assessment and treatment plan with the patient. The patient was provided an opportunity to ask questions and all were answered. The patient agreed with the plan and demonstrated an understanding of the instructions.  The patient was advised to call back or seek an in-person evaluation if the symptoms worsen or if the condition fails to improve as anticipated.  I provided 15 minutes of non  face-to-face telephone visit time during this encounter, and > 50% was spent counseling as documented under my assessment & plan.    Truitt Merle, MD 05/02/2019   I, Joslyn Devon, am acting as scribe for Truitt Merle, MD.   I have reviewed the above documentation for accuracy and completeness, and  I agree with the above.

## 2019-05-02 ENCOUNTER — Inpatient Hospital Stay: Payer: Medicare HMO | Attending: Hematology | Admitting: Hematology

## 2019-05-02 ENCOUNTER — Encounter: Payer: Self-pay | Admitting: Hematology

## 2019-05-02 DIAGNOSIS — E1122 Type 2 diabetes mellitus with diabetic chronic kidney disease: Secondary | ICD-10-CM | POA: Diagnosis not present

## 2019-05-02 DIAGNOSIS — Z433 Encounter for attention to colostomy: Secondary | ICD-10-CM | POA: Diagnosis not present

## 2019-05-02 DIAGNOSIS — Z794 Long term (current) use of insulin: Secondary | ICD-10-CM | POA: Diagnosis not present

## 2019-05-02 DIAGNOSIS — Z483 Aftercare following surgery for neoplasm: Secondary | ICD-10-CM | POA: Diagnosis not present

## 2019-05-02 DIAGNOSIS — E119 Type 2 diabetes mellitus without complications: Secondary | ICD-10-CM | POA: Diagnosis not present

## 2019-05-02 DIAGNOSIS — I130 Hypertensive heart and chronic kidney disease with heart failure and stage 1 through stage 4 chronic kidney disease, or unspecified chronic kidney disease: Secondary | ICD-10-CM | POA: Diagnosis not present

## 2019-05-02 DIAGNOSIS — I10 Essential (primary) hypertension: Secondary | ICD-10-CM | POA: Diagnosis not present

## 2019-05-02 DIAGNOSIS — C787 Secondary malignant neoplasm of liver and intrahepatic bile duct: Secondary | ICD-10-CM | POA: Diagnosis not present

## 2019-05-02 DIAGNOSIS — C189 Malignant neoplasm of colon, unspecified: Secondary | ICD-10-CM | POA: Diagnosis not present

## 2019-05-02 DIAGNOSIS — IMO0001 Reserved for inherently not codable concepts without codable children: Secondary | ICD-10-CM

## 2019-05-02 DIAGNOSIS — D509 Iron deficiency anemia, unspecified: Secondary | ICD-10-CM

## 2019-05-02 DIAGNOSIS — I5032 Chronic diastolic (congestive) heart failure: Secondary | ICD-10-CM | POA: Diagnosis not present

## 2019-05-02 DIAGNOSIS — C185 Malignant neoplasm of splenic flexure: Secondary | ICD-10-CM | POA: Diagnosis not present

## 2019-05-02 DIAGNOSIS — M1990 Unspecified osteoarthritis, unspecified site: Secondary | ICD-10-CM | POA: Diagnosis not present

## 2019-05-02 DIAGNOSIS — N183 Chronic kidney disease, stage 3 (moderate): Secondary | ICD-10-CM | POA: Diagnosis not present

## 2019-05-05 ENCOUNTER — Telehealth: Payer: Self-pay | Admitting: *Deleted

## 2019-05-05 ENCOUNTER — Telehealth: Payer: Self-pay | Admitting: Hematology

## 2019-05-05 NOTE — Telephone Encounter (Signed)
Please let her know that I am not able to admit her to hospital for scan and procedures, her insurance will not pay for that. These will be done as outpt. I will see her after her CT scan then. Thanks   Truitt Merle MD

## 2019-05-05 NOTE — Telephone Encounter (Signed)
Spoke with pt, and was informed that the surgeon Dr. Hassell Done would like to do repeated CT scan before taking drain tubes out.  Pt has not set up appts yet.  Pt wanted to know if Dr. Burr Medico could admit pt to the hospital for a few days to have all procedures done at once before seeing pt in the office for follow up. Pt stated she has not even started driving yet.   Instructed pt to call surgeon's office to find out when CT scan is scheduled, when the drain tubes will be taken out.  Pt to call our office back with the info from surgeon, and we will work with pt to follow up with Dr. Burr Medico.  Pt voiced understanding. Pt's   Phone   [phone].

## 2019-05-05 NOTE — Telephone Encounter (Signed)
Per 6/19 los, patient did not want to schedule any appts. She stated she is not mobile and does not drive her car. I asked her would she need transportation and said no she does not need transportation.  She told me to ask Dr. Burr Medico to give her a call, I informed the MD that the patient did not want to make any appts and that she wanted her to give her a call.   Appts did not get scheduled per the patient request.

## 2019-05-06 ENCOUNTER — Other Ambulatory Visit: Payer: Self-pay | Admitting: Pharmacy Technician

## 2019-05-06 NOTE — Patient Outreach (Signed)
[facility]) Care Management  05/06/2019  Cassandra Allen [facility] [birth year] [id number]    Unsuccessful outreach call placed to patient in regards to BMS application for Eliquis and [employer] application for Levemir and Victoza.  Unfortunately patient did not answer the phone. A message could not be left as the message said the voicemail was full and no message could be left.  Call placed to patient's son Cassandra Allen who answered the phone and asked that I call his mother. Informed him that she was called first but her voicemail picked up and a voicemail could not be left as it said the voicemail box was full. Cassandra Allen informed he could not take my call at this time as he was tied up at the moment.  Was calling to inform that both PAP programs require proof of income such as social security benefits award statement and the BMS PAP requires a printout from the pharmacy.  Will route note to [place] for assistance and will try patient again in 3-5 business days.  [name] P. Mory Herrman, [place] Management [phone]

## 2019-05-12 DIAGNOSIS — R5381 Other malaise: Secondary | ICD-10-CM | POA: Diagnosis not present

## 2019-05-14 ENCOUNTER — Other Ambulatory Visit: Payer: Self-pay | Admitting: Pharmacy Technician

## 2019-05-14 NOTE — Patient Outreach (Signed)
[place] [facility]) Care Management  05/14/2019  Cassandra Allen [facility] 08/23/1946 [id number]  Successful outgoing call placed to patient in regards to BMS application for Eliquis and [employer] application for Levemir and Victoza.  Spoke to patient, HIPAA identifiers verified.  Informed patient that both companies require proof of income to be submitted with the applications. Discussed the options for the proof of income. Also informed patient that BMS requires proof of OOP expenditure which could be EOB from insurance company or pharmacy printout. Informed patient a return envelope with a letter detailing this information could be mailed to patient. Patient was agreeable.  Plan to mail out letter with return envelope to patient on 05/15/2019 and will followup with patient in 5-7 business days.  Tanaja Ganger P. Ernie Kasler, [place] Management [phone]

## 2019-05-15 ENCOUNTER — Encounter: Payer: Self-pay | Admitting: Pharmacy Technician

## 2019-05-20 ENCOUNTER — Ambulatory Visit: Payer: Medicare HMO | Admitting: Pharmacist

## 2019-05-26 ENCOUNTER — Other Ambulatory Visit: Payer: Self-pay | Admitting: Pharmacy Technician

## 2019-05-26 DIAGNOSIS — K651 Peritoneal abscess: Secondary | ICD-10-CM | POA: Diagnosis not present

## 2019-05-26 DIAGNOSIS — R000 Tachycardia, unspecified: Secondary | ICD-10-CM | POA: Diagnosis not present

## 2019-05-26 DIAGNOSIS — K769 Liver disease, unspecified: Secondary | ICD-10-CM | POA: Diagnosis not present

## 2019-05-26 DIAGNOSIS — E279 Disorder of adrenal gland, unspecified: Secondary | ICD-10-CM | POA: Diagnosis not present

## 2019-05-26 DIAGNOSIS — R188 Other ascites: Secondary | ICD-10-CM | POA: Diagnosis not present

## 2019-05-26 DIAGNOSIS — K7689 Other specified diseases of liver: Secondary | ICD-10-CM | POA: Diagnosis not present

## 2019-05-26 DIAGNOSIS — N2889 Other specified disorders of kidney and ureter: Secondary | ICD-10-CM | POA: Diagnosis not present

## 2019-05-26 DIAGNOSIS — I517 Cardiomegaly: Secondary | ICD-10-CM | POA: Diagnosis not present

## 2019-05-26 DIAGNOSIS — J90 Pleural effusion, not elsewhere classified: Secondary | ICD-10-CM | POA: Diagnosis not present

## 2019-05-26 NOTE — Patient Outreach (Signed)
[place] [facility]) Care Management  05/26/2019  Sabriya Yono [facility] June 25, 1946 [id number]  Successful outreach call placed to patient in regards to [employer] application for Levemir and Victoza and BMS application for Eliquis.  Spoke to patient, HIPAA identifiers verified.  Patient informed she received the letter detailing what was needed and the return envelope.  She informed she has the proof of income but she has no way of obtaining her pharmacy printout. She informed the pharmacy is in [place] and she is in [place]. Informed patient that we could also use EOB from the health plan. Also informed her to call the pharmacy and see if they could fax the printout to [place]. She inquired if I could request the printout from the pharmacy. Informed patient that she would have to initiate the request. She informed she had the fax number on the business card that was sent along with the application. She informed she would try and do that this week.  Will followup with patient in 5-10 business days if application not received.  Jozi Malachi P. Krystian Ferrentino, [place] Management [phone]

## 2019-05-26 NOTE — Addendum Note (Signed)
Encounter addended by: Aletta Edouard, MD on: 05/26/2019 2:26 PM  Actions taken: Edit attestation on clinical note

## 2019-05-29 ENCOUNTER — Other Ambulatory Visit (HOSPITAL_COMMUNITY): Payer: Self-pay | Admitting: *Deleted

## 2019-05-29 MED ORDER — APIXABAN 5 MG PO TABS: 5.0000 mg | ORAL_TABLET | Freq: Two times a day (BID) | ORAL | 0 refills | Status: DC

## 2019-05-30 ENCOUNTER — Other Ambulatory Visit (HOSPITAL_COMMUNITY): Payer: Self-pay | Admitting: *Deleted

## 2019-05-30 ENCOUNTER — Other Ambulatory Visit: Payer: Self-pay | Admitting: Pharmacy Technician

## 2019-05-30 MED ORDER — APIXABAN 5 MG PO TABS: 5.0000 mg | ORAL_TABLET | Freq: Two times a day (BID) | ORAL | 0 refills | Status: DC

## 2019-05-30 NOTE — Patient Outreach (Signed)
[facility]) Care Management  05/30/2019  Cassandra Allen [facility] Apr 27, 1946 [id number]  Successful outreach call placed to patient in regards to [employer] application for Levemir and Victoza and BMs application for ELiquis.  Spoke to patient HIPAA identifiers verified.  Patient informed the proof of income was in the mail but that she was having to call the pharmacy's corporate office to obtain the pharmacy printout to submit to BMS.  Will submit applications when information is received.  Sohil Timko P. Kenia Teagarden, [place] Management [phone]

## 2019-06-03 ENCOUNTER — Other Ambulatory Visit: Payer: Self-pay | Admitting: Pharmacy Technician

## 2019-06-03 ENCOUNTER — Telehealth: Payer: Self-pay | Admitting: Nurse Practitioner

## 2019-06-03 NOTE — Telephone Encounter (Signed)
  Called pt to set up a follow  up per 7/21 sch message. - she is unable to set one up at the moment due to transportation , she says she will call back . [employer] set her rides up but they only allow so many at a time , so she will have to look at her appts and she said she will call back .  Message sent to Cira Rue to let her know .

## 2019-06-03 NOTE — Patient Outreach (Signed)
[facility]) Care Management  06/03/2019  Keira Bohlin [facility] [birth year] [id number]    Received all necessary documents, paperwork and signatures from both patient and provider for [place] patient assistance for Levemir and Victoza and for BMS for Eliquis.  Submitted completed application via fax.  Will followup with [employer] in 2-5 business days. Will followup with BMS in 7-10 business days.  Dawnmarie Breon P. Davita Sublett, [place] Management [phone]

## 2019-06-03 NOTE — Telephone Encounter (Signed)
She is having CT tomorrow, I sent schedule message for her to see [place] within the next week.  Thanks, Regan Rakers

## 2019-06-04 ENCOUNTER — Ambulatory Visit
Admission: RE | Admit: 2019-06-04 | Discharge: 2019-06-04 | Disposition: A | Discharge status: Home / Self Care | Payer: Medicare HMO | Source: Ambulatory Visit | Attending: Surgery | Admitting: Surgery

## 2019-06-04 ENCOUNTER — Other Ambulatory Visit: Payer: Self-pay | Admitting: Surgery

## 2019-06-04 ENCOUNTER — Other Ambulatory Visit (HOSPITAL_COMMUNITY): Payer: Self-pay | Admitting: Interventional Radiology

## 2019-06-04 ENCOUNTER — Ambulatory Visit
Admission: RE | Admit: 2019-06-04 | Discharge: 2019-06-04 | Disposition: A | Discharge status: Home / Self Care | Payer: Medicare HMO | Source: Ambulatory Visit | Attending: Student | Admitting: Student

## 2019-06-04 DIAGNOSIS — C189 Malignant neoplasm of colon, unspecified: Secondary | ICD-10-CM | POA: Diagnosis not present

## 2019-06-04 DIAGNOSIS — K651 Peritoneal abscess: Secondary | ICD-10-CM

## 2019-06-04 DIAGNOSIS — Z9049 Acquired absence of other specified parts of digestive tract: Secondary | ICD-10-CM | POA: Diagnosis not present

## 2019-06-04 DIAGNOSIS — T8140XA Infection following a procedure, unspecified, initial encounter: Secondary | ICD-10-CM | POA: Diagnosis not present

## 2019-06-04 DIAGNOSIS — K6811 Postprocedural retroperitoneal abscess: Secondary | ICD-10-CM | POA: Diagnosis not present

## 2019-06-04 DIAGNOSIS — D7389 Other diseases of spleen: Secondary | ICD-10-CM | POA: Diagnosis not present

## 2019-06-04 DIAGNOSIS — R6889 Other general symptoms and signs: Secondary | ICD-10-CM | POA: Diagnosis not present

## 2019-06-04 HISTORY — PX: IR RADIOLOGIST EVAL & MGMT: IMG5224

## 2019-06-04 MED ORDER — IOPAMIDOL (ISOVUE-300) INJECTION 61%
100.0000 mL | Freq: Once | INTRAVENOUS | Status: AC | PRN
  Administered 2019-06-04: 100 mL via INTRAVENOUS

## 2019-06-04 NOTE — Progress Notes (Signed)
Patient ID: Cassandra Allen, female   DOB: 12/31/1945, 73 y.o.   MRN: [id number]       Chief Complaint: Patient was seen in consultation today for postop abscesses, status post 2 drains at the request of Ronney Lion  Referring Physician(s): Dr. Hassell Done  History of Present Illness: Cassandra Allen is a 73 y.o. female with colon cancer, invasive moderately differentiated adenocarcinoma, status post colectomy.  Postop course complicated by 2 abdominal abscesses status post percutaneous drains one in the epigastric region and a second in the left lower quadrant.  Drain placements were in April 2020.  She has had difficulty returning to clinic for follow-up.  She returns today for a CT scan and drain injection if needed.  Overall she is continuing to recover.  No current abdominal pain.  Stable weight and appetite.  She does report continued exudative drainage from the epigastric drain.  Left lower quadrant drain output is minimal.  CT today demonstrates a residual epigastric abscess.  Epigastric drain has slightly retracted but remains within the abscess.  Left lower quadrant drain is stable position and the left lower quadrant abscess has resolved.  No new fluid collections.  Left lower quadrant drain injection demonstrates resolved abscess.  No fistula.  Past Medical History:  Diagnosis Date   Acute diastolic heart failure ([place]) 12/22/2018   Arthritis    Atrial fibrillation, chronic 12/22/2018   Cancer of left colon ([place]) 10/30/2018   Cancer of sigmoid colon  12/27/2018   Diabetes mellitus without complication ([place])    Hypertension    Obesity (BMI 30-39.9) 12/27/2018    Past Surgical History:  Procedure Laterality Date   COLONOSCOPY  10/2018   Dr [name].  Large cancer at splenic flexure,  Bulky sigmoid colon mass, Numerous polyps   Intra-abdominal abscess drainage  01/31/2019   Abscess drainage grew Enterococcus faecalis   IR IMAGING GUIDED PORT INSERTION  11/20/2018     IR RADIOLOGIST EVAL & MGMT  06/04/2019   LAPAROTOMY N/A 12/27/2018   Procedure: LEFT SIGMOID COLECTOMY WITH HARTMANN POUCH AND END COLOSTOMY;  Surgeon: [name], MD;  Location: [facility];  Service: General;  Laterality: N/A;    Allergies: Metformin and related and Prednisone  Medications: Prior to Admission medications   Medication Sig Start Date End Date Taking? Authorizing Provider  acetaminophen (TYLENOL) 500 MG tablet Take 500 mg by mouth every 4 (four) hours as needed for mild pain or fever.     [provider]  Amino Acids-Protein Hydrolys (FEEDING SUPPLEMENT, PRO-STAT SUGAR FREE 64,) LIQD Take 30 mLs by mouth 2 (two) times daily. Patient taking differently: Take 30 mLs by mouth daily.  03/20/19   Hongalgi, Lenis Dickinson, MD  apixaban (ELIQUIS) 5 MG TABS tablet Take 1 tablet (5 mg total) by mouth 2 (two) times daily. 05/30/19   [name], NP  diltiazem (CARDIZEM CD) 240 MG 24 hr capsule Take 1 capsule (240 mg total) by mouth daily. 03/20/19   Hongalgi, Lenis Dickinson, MD  ferrous sulfate (KP FERROUS SULFATE) 325 (65 FE) MG tablet Take 1 tablet (325 mg total) by mouth daily with breakfast. 03/20/19   Hongalgi, Lenis Dickinson, MD  furosemide (LASIX) 20 MG tablet Take 1 tablet (20 mg total) by mouth daily. May take an additional 1 tablet (20 mg) as needed for wt gain of 3 lbs in 1 day or 5 lbs in 1 week 03/20/19   Modena Jansky, MD  Insulin Detemir (LEVEMIR) 100 UNIT/ML Pen Inject 16 Units  into the skin daily. 03/21/19   Hongalgi, Lenis Dickinson, MD  liraglutide (VICTOZA) 18 MG/3ML SOPN Inject 0.3 mLs (1.8 mg total) into the skin every morning. 03/20/19   Hongalgi, Lenis Dickinson, MD  metoprolol tartrate (LOPRESSOR) 25 MG tablet Take 0.5 tablets (12.5 mg total) by mouth 2 (two) times daily. 03/20/19   Hongalgi, Lenis Dickinson, MD  oxybutynin (DITROPAN-XL) 10 MG 24 hr tablet Take 1 tablet (10 mg total) by mouth daily. 03/20/19   Hongalgi, Lenis Dickinson, MD  pantoprazole (PROTONIX) 40 MG tablet Take 1 tablet (40 mg total) by  mouth daily for 30 days. 03/20/19 04/19/19  Modena Jansky, MD  sertraline (ZOLOFT) 100 MG tablet Take 1 tablet (100 mg total) by mouth daily. TAKE 1 TABLET BY MOUTH ONCE DAILY (TAKE WITH 25MG  TO=125MG ) FOR DEPRESSION 03/20/19   Hongalgi, Lenis Dickinson, MD  sertraline (ZOLOFT) 25 MG tablet Take 1 tablet (25 mg total) by mouth daily. TAKE 1 TABLET BY MOUTH ONCE DAILY (TAKE WITH 100MG  TO=125MG ) FOR DEPRESSION 03/20/19   Hongalgi, Lenis Dickinson, MD  simvastatin (ZOCOR) 10 MG tablet Take 1 tablet (10 mg total) by mouth daily at 6 PM. 03/20/19   Hongalgi, Everlene Farrier D, MD  sodium chloride 0.9 % injection Inject 10 mLs into the vein daily. Flush each drain (two drains total) with 10 cc normal saline once daily 03/19/19   Louk, Alexandra M, PA-C  traMADol (ULTRAM) 50 MG tablet Take 1 tablet (50 mg total) by mouth every 6 (six) hours as needed for moderate pain or severe pain. 03/20/19 03/19/20  Modena Jansky, MD     Family History  Problem Relation Age of Onset   Heart attack Mother    Heart attack Father     Social History   Socioeconomic History   Marital status: Divorced    Spouse name: Not on file   Number of children: 2   Years of education: Not on file   Highest education level: Not on file  Occupational History   Not on file  Social Needs   Financial resource strain: Not on file   Food insecurity    Worry: Not on file    Inability: Not on file   Transportation needs    Medical: Not on file    Non-medical: Not on file  Tobacco Use   Smoking status: Never Smoker   Smokeless tobacco: Never Used  Substance and Sexual Activity   Alcohol use: No   Drug use: No   Sexual activity: Not on file  Lifestyle   Physical activity    Days per week: Not on file    Minutes per session: Not on file   Stress: Not on file  Relationships   Social connections    Talks on phone: Not on file    Gets together: Not on file    Attends religious service: Not on file    Active member of club or  organization: Not on file    Attends meetings of clubs or organizations: Not on file    Relationship status: Not on file  Other Topics Concern   Not on file  Social History Narrative   Not on file    ECOG Status: 2 - Symptomatic, <50% confined to bed  Review of Systems: A 12 point ROS discussed and pertinent positives are indicated in the HPI above.  All other systems are negative.  Review of Systems  Vital Signs: BP (!) 142/78    Pulse 83    SpO2  98%   Physical Exam Constitutional:      General: She is not in acute distress.    Appearance: She is not toxic-appearing.  Eyes:     General: No scleral icterus.    Conjunctiva/sclera: Conjunctivae normal.  Abdominal:     General: Bowel sounds are normal. There is no distension.     Palpations: Abdomen is soft.     Tenderness: There is no abdominal tenderness.     Comments: Epigastric and left lower quadrant drain catheter sites are clean, dry and intact.  Neurological:     Mental Status: She is alert.      Imaging: Ct Abdomen Pelvis W Contrast  Result Date: 06/04/2019 CLINICAL DATA:  Colon cancer, previous sigmoid colectomy, liver lesions concerning for metastatic disease, status post 2 percutaneous drains for postop abscesses. EXAM: CT ABDOMEN AND PELVIS WITH CONTRAST TECHNIQUE: Multidetector CT imaging of the abdomen and pelvis was performed using the standard protocol following bolus administration of intravenous contrast. CONTRAST:  165mL ISOVUE-300 IOPAMIDOL (ISOVUE-300) INJECTION 61% COMPARISON:  03/10/2019 FINDINGS: Lower chest: Trace residual left pleural effusion. Minor basilar atelectasis. Stable heart size. No pericardial effusion. Degenerative changes of the lower thoracic spine. Hepatobiliary: Early portal phase enhancement of the liver resulting in unopacified hepatic veins. Despite this, there are scattered indeterminate hepatic hypodensities concerning for slight progression of liver metastases. The caudate lesion  is stable to slightly enlarged measuring 4.8 x 3 cm. No biliary dilatation. Gallbladder is collapsed. Small amount perihepatic ascites. Pancreas: Unremarkable. No pancreatic ductal dilatation or surrounding inflammatory changes. Spleen: Similar pattern and distribution splenic small hypodensities remain indeterminate. No enlarging splenic lesion. Adrenals/Urinary Tract: Stable 3.3 cm left adrenal mass shown to represent adenoma by previous MRI. Normal right adrenal gland. No renal obstruction or focal renal abnormality. No hydroureter or definite ureteral calculus. Bladder unremarkable. Stomach/Bowel: Postop changes from previous left hemicolectomy and right abdominal colostomy. Midline anterior epigastric abscess drain has slightly retracted but remains within the abscess cavity. Epigastric peripherally enhancing collection remains containing a small amount of fluid but multiple foci of air . This collection is smaller but still measuring 8 x 5 cm. Left lower quadrant abscess drain is stable in position. This fluid collection has resolved. Negative for bowel obstruction, significant dilatation, or ileus. Small amount of perihepatic ascites as well as dependent pelvic ascites. Vascular/Lymphatic: Aorta is atherosclerotic. No aneurysm or occlusive process. Similar prominent but nonenlarged retroperitoneal periaortic lymph nodes and left iliac lymph nodes. No definite bulky adenopathy. Reproductive: Uterus and bilateral adnexa are unremarkable. Other: No abdominal wall ventral hernia. No inguinal hernia. Body anasarca noted. Musculoskeletal: Degenerative changes of the spine. No acute osseous finding IMPRESSION: Smaller but persistent upper abdominal epigastric air-fluid collection compatible with abscess. Drain catheter is slightly retracted but remains within the collection. This catheter will be scheduled for exchange and reposition Resolved left lower quadrant abscess at the second drain catheter site. This  catheter will be removed today. Slight increased abdominopelvic ascites Limited assessment of the liver with single-phase imaging but suspect new hepatic lesions concerning for progression of hepatic metastases. Stable subcentimeter small indeterminate splenic lesions Electronically Signed   By: Jerilynn Mages.  Sarahi Borland M.D.   On: 06/04/2019 11:32   Dg Sinus/fist Tube Chk-non Gi  Result Date: 06/04/2019 CLINICAL DATA:  Postop abscess, colon cancer EXAM: ABSCESS INJECTION CONTRAST:  10 cc Omnipaque 300 FLUOROSCOPY TIME:  Fluoroscopy Time:  24 seconds Radiation Exposure Index (if provided by the fluoroscopic device): 38 mGy Number of Acquired Spot Images: . COMPARISON:  01/31/2019 FINDINGS: The left lower quadrant abscess drain was injected with contrast. Fluoroscopic imaging performed. Abscess in this region has resolved. Negative for fistula. Contrast tracks along the catheter to the skin site in a retrograde fashion. This catheter will be removed today. IMPRESSION: Left lower quadrant abscess has resolved.  Negative for fistula. Electronically Signed   By: Jerilynn Mages.  Kana Reimann M.D.   On: 06/04/2019 11:13   Ir Radiologist Eval & Mgmt  Result Date: 06/04/2019 Please refer to notes tab for details about interventional procedure. (Op Note)   Labs:  CBC: Recent Labs    03/15/19 0500 03/16/19 0528 03/17/19 0430 03/18/19 0753  WBC 8.4 7.9 11.3* 9.0  HGB 8.7* 9.6* 9.9* 10.3*  HCT 31.3* 34.1* 34.3* 35.5*  PLT 303 303 295 288    COAGS: Recent Labs    12/27/18 0325 12/28/18 0323  01/31/19 1600 03/11/19 0238  03/12/19 1445 03/12/19 2048 03/13/19 0550 03/14/19 0350  INR 2.14 1.95  --  2.6* 2.1*  --   --   --   --   --   APTT 34  --    < >  --  35   < > 40* 60* 73* 72*   < > = values in this interval not displayed.    BMP: Recent Labs    03/15/19 0500 03/16/19 0528 03/17/19 0430 03/18/19 0753  NA 142 143 141 137  K 2.8* 2.9* 3.3* 3.9  CL 104 102 99 96*  CO2 29 34* 34* 32  GLUCOSE 241* 113* 159* 190*   BUN 10 11 17 17   CALCIUM 8.2* 8.4* 8.5* 8.8*  CREATININE 0.81 0.67 0.95 0.84  GFRNONAA >60 >60 60* >60  GFRAA >60 >60 >60 >60    LIVER FUNCTION TESTS: Recent Labs    12/31/18 0337  01/23/19 01/31/19 1301 03/10/19 2058 03/14/19 0350 03/16/19 0528  BILITOT 0.6  --   --  0.5 2.3* 1.0  --   AST 20   < > 12* 11* 237* 12*  --   ALT 20   < > 20 15 95* 42  --   ALKPHOS 84  --   --  234* 1,377* 528*  --   PROT 4.7*   < > 5.3*   5.3* 5.3* 6.0* 5.3*  --   ALBUMIN 1.5*   < > 2.4   2.4 1.6* 2.5* 2.1* 2.8*   < > = values in this interval not displayed.    TUMOR MARKERS: No results for input(s): AFPTM, CEA, CA199, CHROMGRNA in the last 8760 hours.  Assessment and Plan:  2 postop abscesses 1 epigastric and 1 left lower quadrant, status post percutaneous drains.  Epigastric abscess is a smaller by CT with a slightly retracted drain catheter remaining in the collection.  Left lower quadrant abscess has resolved without fistula by drain injection.  Plan: Schedule for exchange and reposition of the epigastric abscess drain catheter at the hospital in the next couple of days.  Left lower quadrant abscess drain will be removed today.  Electronically Signed: Greggory Keen 06/04/2019, 11:39 AM   I spent a total of    25 Minutes in face to face in clinical consultation, greater than 50% of which was counseling/coordinating care for this patient with postop abscesses.

## 2019-06-05 ENCOUNTER — Ambulatory Visit (HOSPITAL_COMMUNITY)
Admission: RE | Admit: 2019-06-05 | Discharge: 2019-06-05 | Disposition: A | Discharge status: Home / Self Care | Payer: Medicare HMO | Source: Ambulatory Visit | Attending: Interventional Radiology | Admitting: Interventional Radiology

## 2019-06-05 ENCOUNTER — Encounter (HOSPITAL_COMMUNITY): Payer: Self-pay | Admitting: Interventional Radiology

## 2019-06-05 ENCOUNTER — Other Ambulatory Visit: Payer: Self-pay

## 2019-06-05 ENCOUNTER — Other Ambulatory Visit (HOSPITAL_COMMUNITY): Payer: Self-pay | Admitting: Surgery

## 2019-06-05 DIAGNOSIS — K651 Peritoneal abscess: Secondary | ICD-10-CM | POA: Diagnosis not present

## 2019-06-05 DIAGNOSIS — R6889 Other general symptoms and signs: Secondary | ICD-10-CM | POA: Diagnosis not present

## 2019-06-05 DIAGNOSIS — Z4803 Encounter for change or removal of drains: Secondary | ICD-10-CM | POA: Diagnosis not present

## 2019-06-05 DIAGNOSIS — K632 Fistula of intestine: Secondary | ICD-10-CM | POA: Diagnosis not present

## 2019-06-05 DIAGNOSIS — L02211 Cutaneous abscess of abdominal wall: Secondary | ICD-10-CM

## 2019-06-05 HISTORY — PX: IR CATHETER TUBE CHANGE: IMG717

## 2019-06-05 MED ORDER — LIDOCAINE HCL 1 % IJ SOLN
INTRAMUSCULAR | Status: AC
  Filled 2019-06-05: qty 20

## 2019-06-05 MED ORDER — LIDOCAINE HCL (PF) 1 % IJ SOLN
INTRAMUSCULAR | Status: DC | PRN
  Administered 2019-06-05: 5 mL

## 2019-06-05 MED ORDER — IOHEXOL 300 MG/ML  SOLN: 10.0000 mL | Freq: Once | INTRAMUSCULAR | Status: DC | PRN

## 2019-06-05 NOTE — Procedures (Signed)
Pre procedural Dx: Percutaneous drainage catheter evaluation and management. Post procedural Dx: Same  Successful fluoroscopic guided exchange, repositioning and up sizing now 14 French all-purpose drainage catheter within residual abscess within the left upper abdomen   Contrast injection demonstrates fistulous connection between the abscess cavity and adjacent loop of small bowel.   EBL: Minimal  Complications: None immediate  PLAN:  - The patient was instructed to flush the percutaneous drainage catheter with 10 cc of saline twice per day.  Note, the patient was instructed to flush the percutaneous drainage catheter given feculent appearance of the material on CT scan performed 06/04/2019 to ensure adequate functionality of the drainage catheter. - The patient was instructed to maintain diligent records regarding daily drainage catheter output.  - The patient was encouraged to maintain follow-up appointment with referring surgeon, Dr. Hassell Done, scheduled for 7/31.  - The patient return to the interventional radiology clinic the week of August 3rd for IV only CT scan of the abdomen and pelvis followed by potential percutaneous drainage catheter injection.   Ronny Bacon, MD Pager #: [phone]

## 2019-06-06 ENCOUNTER — Other Ambulatory Visit: Payer: Self-pay | Admitting: Pharmacy Technician

## 2019-06-06 DIAGNOSIS — I130 Hypertensive heart and chronic kidney disease with heart failure and stage 1 through stage 4 chronic kidney disease, or unspecified chronic kidney disease: Secondary | ICD-10-CM | POA: Diagnosis not present

## 2019-06-06 DIAGNOSIS — C787 Secondary malignant neoplasm of liver and intrahepatic bile duct: Secondary | ICD-10-CM | POA: Diagnosis not present

## 2019-06-06 DIAGNOSIS — C189 Malignant neoplasm of colon, unspecified: Secondary | ICD-10-CM | POA: Diagnosis not present

## 2019-06-06 DIAGNOSIS — Z433 Encounter for attention to colostomy: Secondary | ICD-10-CM | POA: Diagnosis not present

## 2019-06-06 DIAGNOSIS — I5032 Chronic diastolic (congestive) heart failure: Secondary | ICD-10-CM | POA: Diagnosis not present

## 2019-06-06 DIAGNOSIS — M1990 Unspecified osteoarthritis, unspecified site: Secondary | ICD-10-CM | POA: Diagnosis not present

## 2019-06-06 DIAGNOSIS — N183 Chronic kidney disease, stage 3 (moderate): Secondary | ICD-10-CM | POA: Diagnosis not present

## 2019-06-06 DIAGNOSIS — E1122 Type 2 diabetes mellitus with diabetic chronic kidney disease: Secondary | ICD-10-CM | POA: Diagnosis not present

## 2019-06-06 DIAGNOSIS — I4891 Unspecified atrial fibrillation: Secondary | ICD-10-CM | POA: Diagnosis not present

## 2019-06-06 NOTE — Patient Outreach (Signed)
[facility]) Care Management  06/06/2019  Aritza Brunet [facility] 1946/03/06 [id number]  Care coordination call placed to [place] in regards to patient's applications for Levemir and Victoza.  Spoke to [place] who informed patient had been APPROVED for the program 06/05/2019-11/13/2019. Patient will be receiving 120 days supply of medication and the medication would be delivered in 10-14 business days to the provider's office.  Will followup with patient in 15-20 business days to inquire if medication was picked up from the provider's office.  Maegen Wigle P. Siraj Dermody, [place] Management [phone]

## 2019-06-11 ENCOUNTER — Other Ambulatory Visit: Payer: Self-pay | Admitting: Pharmacist

## 2019-06-11 DIAGNOSIS — Z433 Encounter for attention to colostomy: Secondary | ICD-10-CM | POA: Diagnosis not present

## 2019-06-11 DIAGNOSIS — C787 Secondary malignant neoplasm of liver and intrahepatic bile duct: Secondary | ICD-10-CM | POA: Diagnosis not present

## 2019-06-11 DIAGNOSIS — C189 Malignant neoplasm of colon, unspecified: Secondary | ICD-10-CM | POA: Diagnosis not present

## 2019-06-11 DIAGNOSIS — N183 Chronic kidney disease, stage 3 (moderate): Secondary | ICD-10-CM | POA: Diagnosis not present

## 2019-06-11 DIAGNOSIS — E1122 Type 2 diabetes mellitus with diabetic chronic kidney disease: Secondary | ICD-10-CM | POA: Diagnosis not present

## 2019-06-11 DIAGNOSIS — R5381 Other malaise: Secondary | ICD-10-CM | POA: Diagnosis not present

## 2019-06-11 DIAGNOSIS — I5032 Chronic diastolic (congestive) heart failure: Secondary | ICD-10-CM | POA: Diagnosis not present

## 2019-06-11 DIAGNOSIS — I130 Hypertensive heart and chronic kidney disease with heart failure and stage 1 through stage 4 chronic kidney disease, or unspecified chronic kidney disease: Secondary | ICD-10-CM | POA: Diagnosis not present

## 2019-06-11 DIAGNOSIS — M1990 Unspecified osteoarthritis, unspecified site: Secondary | ICD-10-CM | POA: Diagnosis not present

## 2019-06-11 DIAGNOSIS — I4891 Unspecified atrial fibrillation: Secondary | ICD-10-CM | POA: Diagnosis not present

## 2019-06-11 NOTE — Patient Outreach (Signed)
[facility]) Care Management  06/11/2019  Cassandra Allen [facility] [birth year] [id number]   Patient was called to follow up on medication assistance. HIPAA identifiers were obtained.  Patient was informed that she was approved for Victoza and Levemir through [place] Patient Assistance Program through the end of the year.  She was instructed to be looking for a call from Dr. Myer Peer office about her insulin delivery.  [facility] [place] said they would have a shipment to her provider's office in 10-14 business days on 06/06/19.  Patient was also instructed to mail a copy of her [employer] EOB back to [facility] so it can accompany her Eliquis application.  Patient's medications were reviewed: Medications Reviewed Today    Reviewed by [name], [facility] (Pharmacist) on 06/11/19 at 1139  Med List Status: <None>  Medication Order Taking? Sig Documenting Provider Last Dose Status Informant  acetaminophen (TYLENOL) 500 MG tablet [id number] Yes Take 500 mg by mouth every 4 (four) hours as needed for mild pain or fever.  [provider] Taking Active Nursing Home Medication Administration Guide (MAG)  Amino Acids-Protein Hydrolys (FEEDING SUPPLEMENT, PRO-STAT SUGAR FREE 64,) LIQD [id number] Yes Take 30 mLs by mouth 2 (two) times daily.  Patient taking differently: Take 30 mLs by mouth daily.    Modena Jansky, MD Taking Active   apixaban (ELIQUIS) 5 MG TABS tablet [id number] Yes Take 1 tablet (5 mg total) by mouth 2 (two) times daily. [name], NP Taking Active   diltiazem (CARDIZEM CD) 240 MG 24 hr capsule [id number] Yes Take 1 capsule (240 mg total) by mouth daily. Modena Jansky, MD Taking Active   ferrous sulfate (KP FERROUS SULFATE) 325 (65 FE) MG tablet [id number] Yes Take 1 tablet (325 mg total) by mouth daily with breakfast. Modena Jansky, MD Taking Active   furosemide (LASIX) 20 MG tablet [id number] Yes Take 1 tablet (20 mg total) by mouth daily. May take an  additional 1 tablet (20 mg) as needed for wt gain of 3 lbs in 1 day or 5 lbs in 1 week Hongalgi, Lenis Dickinson, MD Taking Active   Insulin Detemir (LEVEMIR) 100 UNIT/ML Pen [id number] Yes Inject 16 Units into the skin daily. Modena Jansky, MD Taking Active   liraglutide (VICTOZA) 18 MG/3ML SOPN [id number] Yes Inject 0.3 mLs (1.8 mg total) into the skin every morning. Modena Jansky, MD Taking Active   metoprolol tartrate (LOPRESSOR) 25 MG tablet [id number] Yes Take 0.5 tablets (12.5 mg total) by mouth 2 (two) times daily. Modena Jansky, MD Taking Active   oxybutynin (DITROPAN-XL) 10 MG 24 hr tablet [id number] Yes Take 1 tablet (10 mg total) by mouth daily. Modena Jansky, MD Taking Active   pantoprazole (PROTONIX) 40 MG tablet [id number]  Take 1 tablet (40 mg total) by mouth daily for 30 days. Modena Jansky, MD  Expired 04/19/19 2359   sertraline (ZOLOFT) 100 MG tablet [id number] Yes Take 1 tablet (100 mg total) by mouth daily. TAKE 1 TABLET BY MOUTH ONCE DAILY (TAKE WITH 25MG  TO=125MG ) FOR DEPRESSION Hongalgi, Lenis Dickinson, MD Taking Active   sertraline (ZOLOFT) 25 MG tablet [id number] Yes Take 1 tablet (25 mg total) by mouth daily. TAKE 1 TABLET BY MOUTH ONCE DAILY (TAKE WITH 100MG  TO=125MG ) FOR DEPRESSION Hongalgi, Lenis Dickinson, MD Taking Active   simvastatin (ZOCOR) 10 MG tablet [id number] Yes Take 1 tablet (10 mg total) by mouth daily at 6 PM. Hongalgi, Lenis Dickinson, MD Taking Active   sodium  chloride 0.9 % injection [id number] Yes Inject 10 mLs into the vein daily. Flush each drain (two drains total) with 10 cc normal saline once daily Louk, Alexandra M, PA-C Taking Active   traMADol (ULTRAM) 50 MG tablet [id number] Yes Take 1 tablet (50 mg total) by mouth every 6 (six) hours as needed for moderate pain or severe pain. Modena Jansky, MD Taking Active   Med List Note Luciano Cutter, CPhT 01/31/19 1258): [place]           Plan: Route note to Antelope [place] to send  patient a self-addressed envelope and letter requesting the EOB.  [name], PharmD, [place] Clinical Pharmacist [phone]

## 2019-06-13 DIAGNOSIS — R6889 Other general symptoms and signs: Secondary | ICD-10-CM | POA: Diagnosis not present

## 2019-06-13 DIAGNOSIS — C186 Malignant neoplasm of descending colon: Secondary | ICD-10-CM | POA: Diagnosis not present

## 2019-06-15 ENCOUNTER — Other Ambulatory Visit (HOSPITAL_COMMUNITY): Payer: Self-pay | Admitting: Nurse Practitioner

## 2019-06-17 ENCOUNTER — Ambulatory Visit
Admission: RE | Admit: 2019-06-17 | Discharge: 2019-06-17 | Disposition: A | Discharge status: Home / Self Care | Payer: Medicare HMO | Source: Ambulatory Visit | Attending: Surgery | Admitting: Surgery

## 2019-06-17 ENCOUNTER — Encounter: Payer: Self-pay | Admitting: *Deleted

## 2019-06-17 ENCOUNTER — Other Ambulatory Visit: Payer: Self-pay | Admitting: Surgery

## 2019-06-17 DIAGNOSIS — T8143XA Infection following a procedure, organ and space surgical site, initial encounter: Secondary | ICD-10-CM

## 2019-06-17 DIAGNOSIS — L02211 Cutaneous abscess of abdominal wall: Secondary | ICD-10-CM

## 2019-06-17 DIAGNOSIS — K7689 Other specified diseases of liver: Secondary | ICD-10-CM | POA: Diagnosis not present

## 2019-06-17 DIAGNOSIS — K6811 Postprocedural retroperitoneal abscess: Secondary | ICD-10-CM | POA: Diagnosis not present

## 2019-06-17 DIAGNOSIS — Z9049 Acquired absence of other specified parts of digestive tract: Secondary | ICD-10-CM | POA: Diagnosis not present

## 2019-06-17 DIAGNOSIS — K632 Fistula of intestine: Secondary | ICD-10-CM | POA: Diagnosis not present

## 2019-06-17 DIAGNOSIS — R6889 Other general symptoms and signs: Secondary | ICD-10-CM | POA: Diagnosis not present

## 2019-06-17 HISTORY — PX: IR RADIOLOGIST EVAL & MGMT: IMG5224

## 2019-06-17 MED ORDER — IOPAMIDOL (ISOVUE-300) INJECTION 61%
100.0000 mL | Freq: Once | INTRAVENOUS | Status: AC | PRN
  Administered 2019-06-17: 100 mL via INTRAVENOUS

## 2019-06-17 NOTE — Progress Notes (Signed)
Referring Physician(s): Martin,Matthew  Chief Complaint: The patient is seen in follow up today s/p   History of present illness:  Cassandra Allen is a 73 y.o. female with colon cancer, invasive moderately differentiated adenocarcinoma, status post colectomy.  Postop course complicated by 2 abdominal abscesses status post percutaneous drains one in the epigastric region and a second in the left lower quadrant.  Drain placements were in April 2020.  She has had difficulty returning to clinic for follow-up.   CT 06/04/19:  IMPRESSION: Smaller but persistent upper abdominal epigastric air-fluid collection compatible with abscess. Drain catheter is slightly retracted but remains within the collection. This catheter will be scheduled for exchange and reposition Resolved left lower quadrant abscess at the second drain catheter site. This catheter will be removed today. Slight increased abdominopelvic ascites  7/23: Drain exchange:  After fluoroscopic guided exchange, approximately 20 cc of purulent foul-smelling fluid was able to be aspirated. The drainage catheter was connected to a JP bulb and sutured in place. Dressing was applied. The patient tolerated the procedure well without immediate postprocedural.  Returns today for CT and possible injection.  Pt states she has really not been flushing drain- not sure she knows how. OP is moderate still- foul smelling  Did see Dr Hassell Done 7/31-- not to return for 6 weeks per pt Denies fever; chills   Past Medical History:  Diagnosis Date  . Acute diastolic heart failure ([place]) 12/22/2018  . Arthritis   . Atrial fibrillation, chronic 12/22/2018  . Cancer of left colon ([place]) 10/30/2018  . Cancer of sigmoid colon  12/27/2018  . Diabetes mellitus without complication ([place])   . Hypertension   . Obesity (BMI 30-39.9) 12/27/2018    Past Surgical History:  Procedure Laterality Date  . COLONOSCOPY  10/2018   Dr [name].  Large cancer at  splenic flexure,  Bulky sigmoid colon mass, Numerous polyps  . Intra-abdominal abscess drainage  01/31/2019   Abscess drainage grew Enterococcus faecalis  . IR CATHETER TUBE CHANGE  06/05/2019  . IR IMAGING GUIDED PORT INSERTION  11/20/2018  . IR RADIOLOGIST EVAL & MGMT  06/04/2019  . IR RADIOLOGIST EVAL & MGMT  06/17/2019  . LAPAROTOMY N/A 12/27/2018   Procedure: LEFT SIGMOID COLECTOMY WITH HARTMANN POUCH AND END COLOSTOMY;  Surgeon: [name], MD;  Location: [facility];  Service: General;  Laterality: N/A;    Allergies: Metformin and related and Prednisone  Medications: Prior to Admission medications   Medication Sig Start Date End Date Taking? Authorizing Provider  acetaminophen (TYLENOL) 500 MG tablet Take 500 mg by mouth every 4 (four) hours as needed for mild pain or fever.     [provider]  Amino Acids-Protein Hydrolys (FEEDING SUPPLEMENT, PRO-STAT SUGAR FREE 64,) LIQD Take 30 mLs by mouth 2 (two) times daily. Patient taking differently: Take 30 mLs by mouth daily.  03/20/19   Hongalgi, Lenis Dickinson, MD  diltiazem (CARDIZEM CD) 240 MG 24 hr capsule Take 1 capsule (240 mg total) by mouth daily. 03/20/19   Hongalgi, Lenis Dickinson, MD  ELIQUIS 5 MG TABS tablet TAKE 1 TABLET BY MOUTH TWICE A DAY 06/16/19   [name], NP  ferrous sulfate (KP FERROUS SULFATE) 325 (65 FE) MG tablet Take 1 tablet (325 mg total) by mouth daily with breakfast. 03/20/19   Hongalgi, Lenis Dickinson, MD  furosemide (LASIX) 20 MG tablet Take 1 tablet (20 mg total) by mouth daily. May take an additional 1 tablet (20 mg) as needed for wt  gain of 3 lbs in 1 day or 5 lbs in 1 week 03/20/19   Hongalgi, Lenis Dickinson, MD  Insulin Detemir (LEVEMIR) 100 UNIT/ML Pen Inject 16 Units into the skin daily. 03/21/19   Hongalgi, Lenis Dickinson, MD  liraglutide (VICTOZA) 18 MG/3ML SOPN Inject 0.3 mLs (1.8 mg total) into the skin every morning. 03/20/19   Hongalgi, Lenis Dickinson, MD  metoprolol tartrate (LOPRESSOR) 25 MG tablet Take 0.5 tablets (12.5 mg total) by  mouth 2 (two) times daily. 03/20/19   Hongalgi, Lenis Dickinson, MD  oxybutynin (DITROPAN-XL) 10 MG 24 hr tablet Take 1 tablet (10 mg total) by mouth daily. 03/20/19   Hongalgi, Lenis Dickinson, MD  pantoprazole (PROTONIX) 40 MG tablet Take 1 tablet (40 mg total) by mouth daily for 30 days. 03/20/19 04/19/19  Modena Jansky, MD  sertraline (ZOLOFT) 100 MG tablet Take 1 tablet (100 mg total) by mouth daily. TAKE 1 TABLET BY MOUTH ONCE DAILY (TAKE WITH 25MG  TO=125MG ) FOR DEPRESSION 03/20/19   Hongalgi, Lenis Dickinson, MD  sertraline (ZOLOFT) 25 MG tablet Take 1 tablet (25 mg total) by mouth daily. TAKE 1 TABLET BY MOUTH ONCE DAILY (TAKE WITH 100MG  TO=125MG ) FOR DEPRESSION 03/20/19   Hongalgi, Lenis Dickinson, MD  simvastatin (ZOCOR) 10 MG tablet Take 1 tablet (10 mg total) by mouth daily at 6 PM. 03/20/19   Hongalgi, Everlene Farrier D, MD  sodium chloride 0.9 % injection Inject 10 mLs into the vein daily. Flush each drain (two drains total) with 10 cc normal saline once daily 03/19/19   Louk, Alexandra M, PA-C  traMADol (ULTRAM) 50 MG tablet Take 1 tablet (50 mg total) by mouth every 6 (six) hours as needed for moderate pain or severe pain. 03/20/19 03/19/20  Modena Jansky, MD     Family History  Problem Relation Age of Onset  . Heart attack Mother   . Heart attack Father     Social History   Socioeconomic History  . Marital status: Divorced    Spouse name: Not on file  . Number of children: 2  . Years of education: Not on file  . Highest education level: Not on file  Occupational History  . Not on file  Social Needs  . Financial resource strain: Not on file  . Food insecurity    Worry: Not on file    Inability: Not on file  . Transportation needs    Medical: Not on file    Non-medical: Not on file  Tobacco Use  . Smoking status: Never Smoker  . Smokeless tobacco: Never Used  Substance and Sexual Activity  . Alcohol use: No  . Drug use: No  . Sexual activity: Not on file  Lifestyle  . Physical activity    Days per week: Not on  file    Minutes per session: Not on file  . Stress: Not on file  Relationships  . Social Herbalist on phone: Not on file    Gets together: Not on file    Attends religious service: Not on file    Active member of club or organization: Not on file    Attends meetings of clubs or organizations: Not on file    Relationship status: Not on file  Other Topics Concern  . Not on file  Social History Narrative  . Not on file     Vital Signs: There were no vitals taken for this visit.  Physical Exam Skin:    General: Skin is warm  and dry.     Comments: Site is clean and dry NT  OP is milky yellow'++ odorous  OP approx 20 cc in JP  Pt admits not flushing drain correctly  CT reviewed with Dr Kathlene Cote Collection smaller- not gone      Imaging: Ir Radiologist Eval & Mgmt  Result Date: 06/17/2019 Please refer to notes tab for details about interventional procedure. (Op Note)   Labs:  CBC: Recent Labs    03/15/19 0500 03/16/19 0528 03/17/19 0430 03/18/19 0753  WBC 8.4 7.9 11.3* 9.0  HGB 8.7* 9.6* 9.9* 10.3*  HCT 31.3* 34.1* 34.3* 35.5*  PLT 303 303 295 288    COAGS: Recent Labs    12/27/18 0325 12/28/18 0323  01/31/19 1600 03/11/19 0238  03/12/19 1445 03/12/19 2048 03/13/19 0550 03/14/19 0350  INR 2.14 1.95  --  2.6* 2.1*  --   --   --   --   --   APTT 34  --    < >  --  35   < > 40* 60* 73* 72*   < > = values in this interval not displayed.    BMP: Recent Labs    03/15/19 0500 03/16/19 0528 03/17/19 0430 03/18/19 0753  NA 142 143 141 137  K 2.8* 2.9* 3.3* 3.9  CL 104 102 99 96*  CO2 29 34* 34* 32  GLUCOSE 241* 113* 159* 190*  BUN 10 11 17 17   CALCIUM 8.2* 8.4* 8.5* 8.8*  CREATININE 0.81 0.67 0.95 0.84  GFRNONAA >60 >60 60* >60  GFRAA >60 >60 >60 >60    LIVER FUNCTION TESTS: Recent Labs    12/31/18 0337  01/23/19 01/31/19 1301 03/10/19 2058 03/14/19 0350 03/16/19 0528  BILITOT 0.6  --   --  0.5 2.3* 1.0  --   AST 20   <  > 12* 11* 237* 12*  --   ALT 20   < > 20 15 95* 42  --   ALKPHOS 84  --   --  234* 1,377* 528*  --   PROT 4.7*   < > 5.3*  5.3* 5.3* 6.0* 5.3*  --   ALBUMIN 1.5*   < > 2.4  2.4 1.6* 2.5* 2.1* 2.8*   < > = values in this interval not displayed.    Assessment:  Pelvic abscess not resolved on CT per Dr Kathlene Cote Instructed pt on proper flush technique-- she was able to redemonstrate correctly Flush daily 10 cc sterile saline Keep record of OP-- gave new orange card To return 3 weeks for CT and possible drain injection per Dr Kathlene Cote    Signed: Lavonia Drafts, PA-C 06/17/2019, 2:17 PM   Please refer to Dr. Kathlene Cote attestation of this note for management and plan.

## 2019-06-26 DIAGNOSIS — I083 Combined rheumatic disorders of mitral, aortic and tricuspid valves: Secondary | ICD-10-CM | POA: Diagnosis not present

## 2019-06-26 DIAGNOSIS — C189 Malignant neoplasm of colon, unspecified: Secondary | ICD-10-CM | POA: Diagnosis not present

## 2019-06-26 DIAGNOSIS — C7972 Secondary malignant neoplasm of left adrenal gland: Secondary | ICD-10-CM | POA: Diagnosis not present

## 2019-06-26 DIAGNOSIS — E871 Hypo-osmolality and hyponatremia: Secondary | ICD-10-CM | POA: Diagnosis not present

## 2019-06-26 DIAGNOSIS — A4151 Sepsis due to Escherichia coli [E. coli]: Secondary | ICD-10-CM | POA: Diagnosis not present

## 2019-06-26 DIAGNOSIS — Z980 Intestinal bypass and anastomosis status: Secondary | ICD-10-CM | POA: Diagnosis not present

## 2019-06-26 DIAGNOSIS — I482 Chronic atrial fibrillation, unspecified: Secondary | ICD-10-CM | POA: Diagnosis not present

## 2019-06-26 DIAGNOSIS — I272 Pulmonary hypertension, unspecified: Secondary | ICD-10-CM | POA: Diagnosis not present

## 2019-06-26 DIAGNOSIS — R188 Other ascites: Secondary | ICD-10-CM | POA: Diagnosis not present

## 2019-06-26 DIAGNOSIS — Z20828 Contact with and (suspected) exposure to other viral communicable diseases: Secondary | ICD-10-CM | POA: Diagnosis not present

## 2019-06-26 DIAGNOSIS — J9811 Atelectasis: Secondary | ICD-10-CM | POA: Diagnosis not present

## 2019-06-26 DIAGNOSIS — F05 Delirium due to known physiological condition: Secondary | ICD-10-CM | POA: Diagnosis not present

## 2019-06-26 DIAGNOSIS — J90 Pleural effusion, not elsewhere classified: Secondary | ICD-10-CM | POA: Diagnosis not present

## 2019-06-26 DIAGNOSIS — R0682 Tachypnea, not elsewhere classified: Secondary | ICD-10-CM | POA: Diagnosis not present

## 2019-06-26 DIAGNOSIS — R590 Localized enlarged lymph nodes: Secondary | ICD-10-CM | POA: Diagnosis not present

## 2019-06-26 DIAGNOSIS — K7689 Other specified diseases of liver: Secondary | ICD-10-CM | POA: Diagnosis not present

## 2019-06-26 DIAGNOSIS — R0602 Shortness of breath: Secondary | ICD-10-CM | POA: Diagnosis not present

## 2019-06-26 DIAGNOSIS — I509 Heart failure, unspecified: Secondary | ICD-10-CM | POA: Diagnosis not present

## 2019-06-26 DIAGNOSIS — E278 Other specified disorders of adrenal gland: Secondary | ICD-10-CM | POA: Diagnosis not present

## 2019-06-26 DIAGNOSIS — R109 Unspecified abdominal pain: Secondary | ICD-10-CM | POA: Diagnosis not present

## 2019-06-26 DIAGNOSIS — R7881 Bacteremia: Secondary | ICD-10-CM | POA: Diagnosis not present

## 2019-06-26 DIAGNOSIS — R7989 Other specified abnormal findings of blood chemistry: Secondary | ICD-10-CM | POA: Diagnosis not present

## 2019-06-26 DIAGNOSIS — I5023 Acute on chronic systolic (congestive) heart failure: Secondary | ICD-10-CM | POA: Diagnosis not present

## 2019-06-26 DIAGNOSIS — R1900 Intra-abdominal and pelvic swelling, mass and lump, unspecified site: Secondary | ICD-10-CM | POA: Diagnosis not present

## 2019-06-26 DIAGNOSIS — I517 Cardiomegaly: Secondary | ICD-10-CM | POA: Diagnosis not present

## 2019-06-26 DIAGNOSIS — I5033 Acute on chronic diastolic (congestive) heart failure: Secondary | ICD-10-CM | POA: Diagnosis not present

## 2019-06-26 DIAGNOSIS — J189 Pneumonia, unspecified organism: Secondary | ICD-10-CM | POA: Diagnosis not present

## 2019-06-26 DIAGNOSIS — I4821 Permanent atrial fibrillation: Secondary | ICD-10-CM | POA: Diagnosis not present

## 2019-06-26 DIAGNOSIS — I5031 Acute diastolic (congestive) heart failure: Secondary | ICD-10-CM | POA: Diagnosis not present

## 2019-06-26 DIAGNOSIS — N170 Acute kidney failure with tubular necrosis: Secondary | ICD-10-CM | POA: Diagnosis not present

## 2019-06-26 DIAGNOSIS — Z933 Colostomy status: Secondary | ICD-10-CM | POA: Diagnosis not present

## 2019-06-26 DIAGNOSIS — J9601 Acute respiratory failure with hypoxia: Secondary | ICD-10-CM | POA: Diagnosis not present

## 2019-06-26 DIAGNOSIS — E785 Hyperlipidemia, unspecified: Secondary | ICD-10-CM | POA: Diagnosis not present

## 2019-06-26 DIAGNOSIS — I10 Essential (primary) hypertension: Secondary | ICD-10-CM | POA: Diagnosis not present

## 2019-06-26 DIAGNOSIS — K651 Peritoneal abscess: Secondary | ICD-10-CM | POA: Diagnosis not present

## 2019-06-26 DIAGNOSIS — I4891 Unspecified atrial fibrillation: Secondary | ICD-10-CM | POA: Diagnosis not present

## 2019-06-26 DIAGNOSIS — A419 Sepsis, unspecified organism: Secondary | ICD-10-CM | POA: Diagnosis not present

## 2019-06-26 NOTE — Telephone Encounter (Signed)
Can someone please reach out to her and see if she is ready to schedule lab, flush, f/u? Thanks, Regan Rakers

## 2019-06-26 NOTE — Telephone Encounter (Signed)
TC to Pt she stated she is not ready to come in she still has drain in, she stated when she is ready she will call.

## 2019-06-27 ENCOUNTER — Other Ambulatory Visit: Payer: Self-pay | Admitting: Pharmacy Technician

## 2019-06-27 NOTE — Patient Outreach (Signed)
[facility]) Care Management  06/27/2019  Mckenzee Beem [facility] [birth year] [id number]   Successful call placed to patient. However, patient stated she was in the hospital, Informed patient I would call her back at a later date.  Was calling patient to followup on BMS application for Eliquis and [employer] application for Levemir and Victoza.  Will followup with patient in 5-10 business days to inquire if she has picked up the [employer] medications from the provider's office and to inquire if she has mailed in the OOP spend for the BMS application.  Valgene Deloatch P. Kimberli Winne, [place] Management [phone]

## 2019-06-30 ENCOUNTER — Telehealth: Payer: Self-pay | Admitting: Hematology

## 2019-06-30 MED ORDER — OXYBUTYNIN CHLORIDE ER 5 MG PO TB24
10.00 | ORAL_TABLET | ORAL | Status: DC
Start: 2019-07-01 — End: 2019-06-30

## 2019-06-30 MED ORDER — MUPIROCIN 2 % EX OINT
TOPICAL_OINTMENT | CUTANEOUS | Status: DC
Start: 2019-06-30 — End: 2019-06-30

## 2019-06-30 MED ORDER — GENERIC EXTERNAL MEDICATION
Status: DC
Start: ? — End: 2019-06-30

## 2019-06-30 MED ORDER — TRAMADOL HCL 50 MG PO TABS
50.00 | ORAL_TABLET | ORAL | Status: DC
Start: ? — End: 2019-06-30

## 2019-06-30 MED ORDER — DOXAZOSIN MESYLATE 2 MG PO TABS
8.00 | ORAL_TABLET | ORAL | Status: DC
Start: 2019-06-30 — End: 2019-06-30

## 2019-06-30 MED ORDER — NITROGLYCERIN 0.4 MG SL SUBL
0.40 | SUBLINGUAL_TABLET | SUBLINGUAL | Status: DC
Start: ? — End: 2019-06-30

## 2019-06-30 MED ORDER — METOPROLOL TARTRATE 25 MG PO TABS
25.00 | ORAL_TABLET | ORAL | Status: DC
Start: 2019-06-30 — End: 2019-06-30

## 2019-06-30 MED ORDER — DIGOXIN 125 MCG PO TABS
.13 | ORAL_TABLET | ORAL | Status: DC
Start: 2019-07-01 — End: 2019-06-30

## 2019-06-30 MED ORDER — SODIUM CHLORIDE 0.9 % IV SOLN
10.00 | INTRAVENOUS | Status: DC
Start: ? — End: 2019-06-30

## 2019-06-30 MED ORDER — GENERIC EXTERNAL MEDICATION
100.00 | Status: DC
Start: 2019-07-01 — End: 2019-06-30

## 2019-06-30 MED ORDER — VANCOMYCIN HCL IN NACL 1.25-0.9 GM/250ML-% IV SOLN
1.25 | INTRAVENOUS | Status: DC
Start: 2019-07-01 — End: 2019-06-30

## 2019-06-30 MED ORDER — SODIUM CHLORIDE 0.9 % IV SOLN
50.00 | INTRAVENOUS | Status: DC
Start: ? — End: 2019-06-30

## 2019-06-30 MED ORDER — INSULIN LISPRO 100 UNIT/ML ~~LOC~~ SOLN
1.00 | SUBCUTANEOUS | Status: DC
Start: 2019-06-30 — End: 2019-06-30

## 2019-06-30 MED ORDER — AMIODARONE HCL 200 MG PO TABS
200.00 | ORAL_TABLET | ORAL | Status: DC
Start: 2019-06-30 — End: 2019-06-30

## 2019-06-30 MED ORDER — Medication
Status: DC
Start: ? — End: 2019-06-30

## 2019-06-30 MED ORDER — GENERIC EXTERNAL MEDICATION
500.00 | Status: DC
Start: 2019-06-30 — End: 2019-06-30

## 2019-06-30 MED ORDER — INSULIN GLARGINE 100 UNIT/ML ~~LOC~~ SOLN
1.00 | SUBCUTANEOUS | Status: DC
Start: 2019-06-30 — End: 2019-06-30

## 2019-06-30 MED ORDER — SERTRALINE HCL 100 MG PO TABS
100.00 | ORAL_TABLET | ORAL | Status: DC
Start: 2019-07-01 — End: 2019-06-30

## 2019-06-30 MED ORDER — PRAVASTATIN SODIUM 40 MG PO TABS
40.00 | ORAL_TABLET | ORAL | Status: DC
Start: 2019-06-30 — End: 2019-06-30

## 2019-06-30 MED ORDER — INSULIN GLARGINE 100 UNIT/ML ~~LOC~~ SOLN
1.00 | SUBCUTANEOUS | Status: DC
Start: ? — End: 2019-06-30

## 2019-06-30 MED ORDER — INSULIN LISPRO 100 UNIT/ML ~~LOC~~ SOLN
1.00 | SUBCUTANEOUS | Status: DC
Start: ? — End: 2019-06-30

## 2019-06-30 MED ORDER — ALUM & MAG HYDROXIDE-SIMETH 200-200-20 MG/5ML PO SUSP
30.00 | ORAL | Status: DC
Start: ? — End: 2019-06-30

## 2019-06-30 NOTE — Telephone Encounter (Signed)
Called patient to schedule an appt per staff message.  Patient stated she isn't able to schedule for this week nor next week because she is in the hospital and that she will call us when she is able to schedule.

## 2019-07-05 DIAGNOSIS — G44319 Acute post-traumatic headache, not intractable: Secondary | ICD-10-CM | POA: Diagnosis not present

## 2019-07-05 DIAGNOSIS — R109 Unspecified abdominal pain: Secondary | ICD-10-CM | POA: Diagnosis not present

## 2019-07-05 DIAGNOSIS — Z79899 Other long term (current) drug therapy: Secondary | ICD-10-CM | POA: Diagnosis not present

## 2019-07-05 DIAGNOSIS — M47817 Spondylosis without myelopathy or radiculopathy, lumbosacral region: Secondary | ICD-10-CM | POA: Diagnosis not present

## 2019-07-05 DIAGNOSIS — M5136 Other intervertebral disc degeneration, lumbar region: Secondary | ICD-10-CM | POA: Diagnosis not present

## 2019-07-05 DIAGNOSIS — M47816 Spondylosis without myelopathy or radiculopathy, lumbar region: Secondary | ICD-10-CM | POA: Diagnosis not present

## 2019-07-05 DIAGNOSIS — S0990XA Unspecified injury of head, initial encounter: Secondary | ICD-10-CM | POA: Diagnosis not present

## 2019-07-05 DIAGNOSIS — Z888 Allergy status to other drugs, medicaments and biological substances status: Secondary | ICD-10-CM | POA: Diagnosis not present

## 2019-07-05 DIAGNOSIS — I10 Essential (primary) hypertension: Secondary | ICD-10-CM | POA: Diagnosis not present

## 2019-07-05 DIAGNOSIS — M5137 Other intervertebral disc degeneration, lumbosacral region: Secondary | ICD-10-CM | POA: Diagnosis not present

## 2019-07-05 DIAGNOSIS — E119 Type 2 diabetes mellitus without complications: Secondary | ICD-10-CM | POA: Diagnosis not present

## 2019-07-06 DIAGNOSIS — Z433 Encounter for attention to colostomy: Secondary | ICD-10-CM | POA: Diagnosis not present

## 2019-07-06 DIAGNOSIS — I4891 Unspecified atrial fibrillation: Secondary | ICD-10-CM | POA: Diagnosis not present

## 2019-07-06 DIAGNOSIS — M1990 Unspecified osteoarthritis, unspecified site: Secondary | ICD-10-CM | POA: Diagnosis not present

## 2019-07-06 DIAGNOSIS — I130 Hypertensive heart and chronic kidney disease with heart failure and stage 1 through stage 4 chronic kidney disease, or unspecified chronic kidney disease: Secondary | ICD-10-CM | POA: Diagnosis not present

## 2019-07-06 DIAGNOSIS — C787 Secondary malignant neoplasm of liver and intrahepatic bile duct: Secondary | ICD-10-CM | POA: Diagnosis not present

## 2019-07-06 DIAGNOSIS — I5032 Chronic diastolic (congestive) heart failure: Secondary | ICD-10-CM | POA: Diagnosis not present

## 2019-07-06 DIAGNOSIS — C189 Malignant neoplasm of colon, unspecified: Secondary | ICD-10-CM | POA: Diagnosis not present

## 2019-07-06 DIAGNOSIS — E1122 Type 2 diabetes mellitus with diabetic chronic kidney disease: Secondary | ICD-10-CM | POA: Diagnosis not present

## 2019-07-06 DIAGNOSIS — N183 Chronic kidney disease, stage 3 (moderate): Secondary | ICD-10-CM | POA: Diagnosis not present

## 2019-07-08 ENCOUNTER — Other Ambulatory Visit: Payer: Medicare HMO

## 2019-07-08 DIAGNOSIS — I5032 Chronic diastolic (congestive) heart failure: Secondary | ICD-10-CM | POA: Diagnosis not present

## 2019-07-08 DIAGNOSIS — C787 Secondary malignant neoplasm of liver and intrahepatic bile duct: Secondary | ICD-10-CM | POA: Diagnosis not present

## 2019-07-08 DIAGNOSIS — M1990 Unspecified osteoarthritis, unspecified site: Secondary | ICD-10-CM | POA: Diagnosis not present

## 2019-07-08 DIAGNOSIS — Z433 Encounter for attention to colostomy: Secondary | ICD-10-CM | POA: Diagnosis not present

## 2019-07-08 DIAGNOSIS — I4891 Unspecified atrial fibrillation: Secondary | ICD-10-CM | POA: Diagnosis not present

## 2019-07-08 DIAGNOSIS — N183 Chronic kidney disease, stage 3 (moderate): Secondary | ICD-10-CM | POA: Diagnosis not present

## 2019-07-08 DIAGNOSIS — E1122 Type 2 diabetes mellitus with diabetic chronic kidney disease: Secondary | ICD-10-CM | POA: Diagnosis not present

## 2019-07-08 DIAGNOSIS — C189 Malignant neoplasm of colon, unspecified: Secondary | ICD-10-CM | POA: Diagnosis not present

## 2019-07-08 DIAGNOSIS — I130 Hypertensive heart and chronic kidney disease with heart failure and stage 1 through stage 4 chronic kidney disease, or unspecified chronic kidney disease: Secondary | ICD-10-CM | POA: Diagnosis not present

## 2019-07-10 DIAGNOSIS — M1990 Unspecified osteoarthritis, unspecified site: Secondary | ICD-10-CM | POA: Diagnosis not present

## 2019-07-10 DIAGNOSIS — I5032 Chronic diastolic (congestive) heart failure: Secondary | ICD-10-CM | POA: Diagnosis not present

## 2019-07-10 DIAGNOSIS — C787 Secondary malignant neoplasm of liver and intrahepatic bile duct: Secondary | ICD-10-CM | POA: Diagnosis not present

## 2019-07-10 DIAGNOSIS — Z433 Encounter for attention to colostomy: Secondary | ICD-10-CM | POA: Diagnosis not present

## 2019-07-10 DIAGNOSIS — I130 Hypertensive heart and chronic kidney disease with heart failure and stage 1 through stage 4 chronic kidney disease, or unspecified chronic kidney disease: Secondary | ICD-10-CM | POA: Diagnosis not present

## 2019-07-10 DIAGNOSIS — E1122 Type 2 diabetes mellitus with diabetic chronic kidney disease: Secondary | ICD-10-CM | POA: Diagnosis not present

## 2019-07-10 DIAGNOSIS — I4891 Unspecified atrial fibrillation: Secondary | ICD-10-CM | POA: Diagnosis not present

## 2019-07-10 DIAGNOSIS — N183 Chronic kidney disease, stage 3 (moderate): Secondary | ICD-10-CM | POA: Diagnosis not present

## 2019-07-10 DIAGNOSIS — C189 Malignant neoplasm of colon, unspecified: Secondary | ICD-10-CM | POA: Diagnosis not present

## 2019-07-11 DIAGNOSIS — I130 Hypertensive heart and chronic kidney disease with heart failure and stage 1 through stage 4 chronic kidney disease, or unspecified chronic kidney disease: Secondary | ICD-10-CM | POA: Diagnosis not present

## 2019-07-11 DIAGNOSIS — E1122 Type 2 diabetes mellitus with diabetic chronic kidney disease: Secondary | ICD-10-CM | POA: Diagnosis not present

## 2019-07-11 DIAGNOSIS — N183 Chronic kidney disease, stage 3 (moderate): Secondary | ICD-10-CM | POA: Diagnosis not present

## 2019-07-11 DIAGNOSIS — I5032 Chronic diastolic (congestive) heart failure: Secondary | ICD-10-CM | POA: Diagnosis not present

## 2019-07-11 DIAGNOSIS — M1990 Unspecified osteoarthritis, unspecified site: Secondary | ICD-10-CM | POA: Diagnosis not present

## 2019-07-11 DIAGNOSIS — C189 Malignant neoplasm of colon, unspecified: Secondary | ICD-10-CM | POA: Diagnosis not present

## 2019-07-11 DIAGNOSIS — Z433 Encounter for attention to colostomy: Secondary | ICD-10-CM | POA: Diagnosis not present

## 2019-07-11 DIAGNOSIS — I4891 Unspecified atrial fibrillation: Secondary | ICD-10-CM | POA: Diagnosis not present

## 2019-07-11 DIAGNOSIS — C787 Secondary malignant neoplasm of liver and intrahepatic bile duct: Secondary | ICD-10-CM | POA: Diagnosis not present

## 2019-07-12 DIAGNOSIS — R5381 Other malaise: Secondary | ICD-10-CM | POA: Diagnosis not present

## 2019-07-14 DIAGNOSIS — C186 Malignant neoplasm of descending colon: Secondary | ICD-10-CM | POA: Diagnosis not present

## 2019-07-14 DIAGNOSIS — E1122 Type 2 diabetes mellitus with diabetic chronic kidney disease: Secondary | ICD-10-CM | POA: Diagnosis not present

## 2019-07-14 DIAGNOSIS — D649 Anemia, unspecified: Secondary | ICD-10-CM | POA: Diagnosis not present

## 2019-07-14 DIAGNOSIS — C787 Secondary malignant neoplasm of liver and intrahepatic bile duct: Secondary | ICD-10-CM | POA: Diagnosis not present

## 2019-07-14 DIAGNOSIS — F99 Mental disorder, not otherwise specified: Secondary | ICD-10-CM | POA: Diagnosis not present

## 2019-07-14 DIAGNOSIS — I5032 Chronic diastolic (congestive) heart failure: Secondary | ICD-10-CM | POA: Diagnosis not present

## 2019-07-14 DIAGNOSIS — N183 Chronic kidney disease, stage 3 (moderate): Secondary | ICD-10-CM | POA: Diagnosis not present

## 2019-07-14 DIAGNOSIS — I4891 Unspecified atrial fibrillation: Secondary | ICD-10-CM | POA: Diagnosis not present

## 2019-07-14 DIAGNOSIS — C189 Malignant neoplasm of colon, unspecified: Secondary | ICD-10-CM | POA: Diagnosis not present

## 2019-07-14 DIAGNOSIS — I130 Hypertensive heart and chronic kidney disease with heart failure and stage 1 through stage 4 chronic kidney disease, or unspecified chronic kidney disease: Secondary | ICD-10-CM | POA: Diagnosis not present

## 2019-07-14 DIAGNOSIS — Z433 Encounter for attention to colostomy: Secondary | ICD-10-CM | POA: Diagnosis not present

## 2019-07-14 DIAGNOSIS — M1990 Unspecified osteoarthritis, unspecified site: Secondary | ICD-10-CM | POA: Diagnosis not present

## 2019-07-15 DIAGNOSIS — N183 Chronic kidney disease, stage 3 (moderate): Secondary | ICD-10-CM | POA: Diagnosis not present

## 2019-07-15 DIAGNOSIS — E1122 Type 2 diabetes mellitus with diabetic chronic kidney disease: Secondary | ICD-10-CM | POA: Diagnosis not present

## 2019-07-15 DIAGNOSIS — C787 Secondary malignant neoplasm of liver and intrahepatic bile duct: Secondary | ICD-10-CM | POA: Diagnosis not present

## 2019-07-15 DIAGNOSIS — C189 Malignant neoplasm of colon, unspecified: Secondary | ICD-10-CM | POA: Diagnosis not present

## 2019-07-15 DIAGNOSIS — I130 Hypertensive heart and chronic kidney disease with heart failure and stage 1 through stage 4 chronic kidney disease, or unspecified chronic kidney disease: Secondary | ICD-10-CM | POA: Diagnosis not present

## 2019-07-15 DIAGNOSIS — M1990 Unspecified osteoarthritis, unspecified site: Secondary | ICD-10-CM | POA: Diagnosis not present

## 2019-07-15 DIAGNOSIS — I5032 Chronic diastolic (congestive) heart failure: Secondary | ICD-10-CM | POA: Diagnosis not present

## 2019-07-15 DIAGNOSIS — Z433 Encounter for attention to colostomy: Secondary | ICD-10-CM | POA: Diagnosis not present

## 2019-07-15 DIAGNOSIS — I4891 Unspecified atrial fibrillation: Secondary | ICD-10-CM | POA: Diagnosis not present

## 2019-07-16 ENCOUNTER — Telehealth: Payer: Self-pay | Admitting: *Deleted

## 2019-07-17 ENCOUNTER — Other Ambulatory Visit: Payer: Self-pay | Admitting: Pharmacist

## 2019-07-17 ENCOUNTER — Ambulatory Visit: Payer: Self-pay | Admitting: Pharmacist

## 2019-07-17 NOTE — Patient Outreach (Addendum)
[place] [facility]) Care Management  07/17/2019  Cassandra Allen [facility] 1945/12/15 [id number]   Patient was called to follow up. Unfortunately, she did not answer the phone. HIPAA compliant message was left on her voicemail.  Patient sent the 2019 EOB statement from her insurance. The most recent 2020 EOB statement is needed for patient assistance program documentation.  Patient was hospitalized 8/13-08/21 at [facility]).  She went to the ED [id number] for complications of a fall.  Plan: Call patient back in 5-7 business days Send patient a letter requesting the 2020 EOB statement.  [name], PharmD, [place] Clinical Pharmacist [phone]

## 2019-07-19 DIAGNOSIS — M1990 Unspecified osteoarthritis, unspecified site: Secondary | ICD-10-CM | POA: Diagnosis not present

## 2019-07-19 DIAGNOSIS — Z433 Encounter for attention to colostomy: Secondary | ICD-10-CM | POA: Diagnosis not present

## 2019-07-19 DIAGNOSIS — N183 Chronic kidney disease, stage 3 unspecified: Secondary | ICD-10-CM | POA: Diagnosis not present

## 2019-07-19 DIAGNOSIS — I4891 Unspecified atrial fibrillation: Secondary | ICD-10-CM | POA: Diagnosis not present

## 2019-07-19 DIAGNOSIS — C189 Malignant neoplasm of colon, unspecified: Secondary | ICD-10-CM | POA: Diagnosis not present

## 2019-07-19 DIAGNOSIS — C787 Secondary malignant neoplasm of liver and intrahepatic bile duct: Secondary | ICD-10-CM | POA: Diagnosis not present

## 2019-07-19 DIAGNOSIS — E1122 Type 2 diabetes mellitus with diabetic chronic kidney disease: Secondary | ICD-10-CM | POA: Diagnosis not present

## 2019-07-19 DIAGNOSIS — I130 Hypertensive heart and chronic kidney disease with heart failure and stage 1 through stage 4 chronic kidney disease, or unspecified chronic kidney disease: Secondary | ICD-10-CM | POA: Diagnosis not present

## 2019-07-19 DIAGNOSIS — I5032 Chronic diastolic (congestive) heart failure: Secondary | ICD-10-CM | POA: Diagnosis not present

## 2019-07-20 ENCOUNTER — Other Ambulatory Visit (HOSPITAL_COMMUNITY): Payer: Self-pay | Admitting: Nurse Practitioner

## 2019-07-21 DIAGNOSIS — I130 Hypertensive heart and chronic kidney disease with heart failure and stage 1 through stage 4 chronic kidney disease, or unspecified chronic kidney disease: Secondary | ICD-10-CM | POA: Diagnosis not present

## 2019-07-21 DIAGNOSIS — C189 Malignant neoplasm of colon, unspecified: Secondary | ICD-10-CM | POA: Diagnosis not present

## 2019-07-21 DIAGNOSIS — I4891 Unspecified atrial fibrillation: Secondary | ICD-10-CM | POA: Diagnosis not present

## 2019-07-21 DIAGNOSIS — M1990 Unspecified osteoarthritis, unspecified site: Secondary | ICD-10-CM | POA: Diagnosis not present

## 2019-07-21 DIAGNOSIS — I5032 Chronic diastolic (congestive) heart failure: Secondary | ICD-10-CM | POA: Diagnosis not present

## 2019-07-21 DIAGNOSIS — Z433 Encounter for attention to colostomy: Secondary | ICD-10-CM | POA: Diagnosis not present

## 2019-07-21 DIAGNOSIS — C787 Secondary malignant neoplasm of liver and intrahepatic bile duct: Secondary | ICD-10-CM | POA: Diagnosis not present

## 2019-07-21 DIAGNOSIS — N183 Chronic kidney disease, stage 3 unspecified: Secondary | ICD-10-CM | POA: Diagnosis not present

## 2019-07-21 DIAGNOSIS — E1122 Type 2 diabetes mellitus with diabetic chronic kidney disease: Secondary | ICD-10-CM | POA: Diagnosis not present

## 2019-07-22 NOTE — Telephone Encounter (Signed)
Age 73, weight 74kg, SCr 0.62 on 04/16/19, last OV May 2020, afib indication

## 2019-07-23 ENCOUNTER — Ambulatory Visit: Payer: Self-pay | Admitting: Pharmacist

## 2019-07-23 ENCOUNTER — Other Ambulatory Visit: Payer: Self-pay | Admitting: Pharmacist

## 2019-07-23 DIAGNOSIS — I4891 Unspecified atrial fibrillation: Secondary | ICD-10-CM | POA: Diagnosis not present

## 2019-07-23 DIAGNOSIS — M1990 Unspecified osteoarthritis, unspecified site: Secondary | ICD-10-CM | POA: Diagnosis not present

## 2019-07-23 DIAGNOSIS — N183 Chronic kidney disease, stage 3 unspecified: Secondary | ICD-10-CM | POA: Diagnosis not present

## 2019-07-23 DIAGNOSIS — I130 Hypertensive heart and chronic kidney disease with heart failure and stage 1 through stage 4 chronic kidney disease, or unspecified chronic kidney disease: Secondary | ICD-10-CM | POA: Diagnosis not present

## 2019-07-23 DIAGNOSIS — C189 Malignant neoplasm of colon, unspecified: Secondary | ICD-10-CM | POA: Diagnosis not present

## 2019-07-23 DIAGNOSIS — Z433 Encounter for attention to colostomy: Secondary | ICD-10-CM | POA: Diagnosis not present

## 2019-07-23 DIAGNOSIS — E1122 Type 2 diabetes mellitus with diabetic chronic kidney disease: Secondary | ICD-10-CM | POA: Diagnosis not present

## 2019-07-23 DIAGNOSIS — I5032 Chronic diastolic (congestive) heart failure: Secondary | ICD-10-CM | POA: Diagnosis not present

## 2019-07-23 DIAGNOSIS — C787 Secondary malignant neoplasm of liver and intrahepatic bile duct: Secondary | ICD-10-CM | POA: Diagnosis not present

## 2019-07-23 NOTE — Patient Outreach (Signed)
[facility]) Care Management  07/23/2019  Clotilde [facility] Dec 09, 1945 [id number]   Patient was called regarding medication assistance. She sent financial documentation for 2019 instead of 2020.  Unfortunately, she did not answer the phone. HIPAA compliant message was left on her voicemail.  Plan: Call patient back in 7-10 business days.   [name], PharmD, [place] Clinical Pharmacist [phone]

## 2019-07-24 ENCOUNTER — Other Ambulatory Visit: Payer: Medicare HMO

## 2019-07-25 DIAGNOSIS — C787 Secondary malignant neoplasm of liver and intrahepatic bile duct: Secondary | ICD-10-CM | POA: Diagnosis not present

## 2019-07-25 DIAGNOSIS — I4891 Unspecified atrial fibrillation: Secondary | ICD-10-CM | POA: Diagnosis not present

## 2019-07-25 DIAGNOSIS — E1122 Type 2 diabetes mellitus with diabetic chronic kidney disease: Secondary | ICD-10-CM | POA: Diagnosis not present

## 2019-07-25 DIAGNOSIS — I5032 Chronic diastolic (congestive) heart failure: Secondary | ICD-10-CM | POA: Diagnosis not present

## 2019-07-25 DIAGNOSIS — C189 Malignant neoplasm of colon, unspecified: Secondary | ICD-10-CM | POA: Diagnosis not present

## 2019-07-25 DIAGNOSIS — M1990 Unspecified osteoarthritis, unspecified site: Secondary | ICD-10-CM | POA: Diagnosis not present

## 2019-07-25 DIAGNOSIS — N183 Chronic kidney disease, stage 3 unspecified: Secondary | ICD-10-CM | POA: Diagnosis not present

## 2019-07-25 DIAGNOSIS — I130 Hypertensive heart and chronic kidney disease with heart failure and stage 1 through stage 4 chronic kidney disease, or unspecified chronic kidney disease: Secondary | ICD-10-CM | POA: Diagnosis not present

## 2019-07-25 DIAGNOSIS — Z433 Encounter for attention to colostomy: Secondary | ICD-10-CM | POA: Diagnosis not present

## 2019-07-28 DIAGNOSIS — M1990 Unspecified osteoarthritis, unspecified site: Secondary | ICD-10-CM | POA: Diagnosis not present

## 2019-07-28 DIAGNOSIS — I5032 Chronic diastolic (congestive) heart failure: Secondary | ICD-10-CM | POA: Diagnosis not present

## 2019-07-28 DIAGNOSIS — Z433 Encounter for attention to colostomy: Secondary | ICD-10-CM | POA: Diagnosis not present

## 2019-07-28 DIAGNOSIS — C787 Secondary malignant neoplasm of liver and intrahepatic bile duct: Secondary | ICD-10-CM | POA: Diagnosis not present

## 2019-07-28 DIAGNOSIS — E1122 Type 2 diabetes mellitus with diabetic chronic kidney disease: Secondary | ICD-10-CM | POA: Diagnosis not present

## 2019-07-28 DIAGNOSIS — I130 Hypertensive heart and chronic kidney disease with heart failure and stage 1 through stage 4 chronic kidney disease, or unspecified chronic kidney disease: Secondary | ICD-10-CM | POA: Diagnosis not present

## 2019-07-28 DIAGNOSIS — C189 Malignant neoplasm of colon, unspecified: Secondary | ICD-10-CM | POA: Diagnosis not present

## 2019-07-28 DIAGNOSIS — I4891 Unspecified atrial fibrillation: Secondary | ICD-10-CM | POA: Diagnosis not present

## 2019-07-28 DIAGNOSIS — N183 Chronic kidney disease, stage 3 unspecified: Secondary | ICD-10-CM | POA: Diagnosis not present

## 2019-07-29 DIAGNOSIS — I130 Hypertensive heart and chronic kidney disease with heart failure and stage 1 through stage 4 chronic kidney disease, or unspecified chronic kidney disease: Secondary | ICD-10-CM | POA: Diagnosis not present

## 2019-07-29 DIAGNOSIS — C189 Malignant neoplasm of colon, unspecified: Secondary | ICD-10-CM | POA: Diagnosis not present

## 2019-07-29 DIAGNOSIS — I5032 Chronic diastolic (congestive) heart failure: Secondary | ICD-10-CM | POA: Diagnosis not present

## 2019-07-29 DIAGNOSIS — C787 Secondary malignant neoplasm of liver and intrahepatic bile duct: Secondary | ICD-10-CM | POA: Diagnosis not present

## 2019-07-29 DIAGNOSIS — Z433 Encounter for attention to colostomy: Secondary | ICD-10-CM | POA: Diagnosis not present

## 2019-07-29 DIAGNOSIS — E1122 Type 2 diabetes mellitus with diabetic chronic kidney disease: Secondary | ICD-10-CM | POA: Diagnosis not present

## 2019-07-29 DIAGNOSIS — N183 Chronic kidney disease, stage 3 unspecified: Secondary | ICD-10-CM | POA: Diagnosis not present

## 2019-07-29 DIAGNOSIS — I4891 Unspecified atrial fibrillation: Secondary | ICD-10-CM | POA: Diagnosis not present

## 2019-07-29 DIAGNOSIS — M1990 Unspecified osteoarthritis, unspecified site: Secondary | ICD-10-CM | POA: Diagnosis not present

## 2019-07-30 ENCOUNTER — Other Ambulatory Visit: Payer: Self-pay | Admitting: Pharmacy Technician

## 2019-07-30 DIAGNOSIS — Z433 Encounter for attention to colostomy: Secondary | ICD-10-CM | POA: Diagnosis not present

## 2019-07-30 DIAGNOSIS — E1122 Type 2 diabetes mellitus with diabetic chronic kidney disease: Secondary | ICD-10-CM | POA: Diagnosis not present

## 2019-07-30 DIAGNOSIS — C787 Secondary malignant neoplasm of liver and intrahepatic bile duct: Secondary | ICD-10-CM | POA: Diagnosis not present

## 2019-07-30 DIAGNOSIS — M1990 Unspecified osteoarthritis, unspecified site: Secondary | ICD-10-CM | POA: Diagnosis not present

## 2019-07-30 DIAGNOSIS — I4891 Unspecified atrial fibrillation: Secondary | ICD-10-CM | POA: Diagnosis not present

## 2019-07-30 DIAGNOSIS — I5032 Chronic diastolic (congestive) heart failure: Secondary | ICD-10-CM | POA: Diagnosis not present

## 2019-07-30 DIAGNOSIS — I130 Hypertensive heart and chronic kidney disease with heart failure and stage 1 through stage 4 chronic kidney disease, or unspecified chronic kidney disease: Secondary | ICD-10-CM | POA: Diagnosis not present

## 2019-07-30 DIAGNOSIS — N183 Chronic kidney disease, stage 3 unspecified: Secondary | ICD-10-CM | POA: Diagnosis not present

## 2019-07-30 DIAGNOSIS — C189 Malignant neoplasm of colon, unspecified: Secondary | ICD-10-CM | POA: Diagnosis not present

## 2019-07-30 NOTE — Patient Outreach (Signed)
[facility]) Care Management  07/30/2019  Michalea Schubach [facility] 1946/06/23 [id number]  Successful outreach call placed to patient in regards to [employer] application for Levemir and Victoza and BMS application for Eliquis.  Spoke to patient, HIPAA identifiers verified.  Patient informed she picked up the medication from her physician office.   Patient inquired about BMS application for Eliquis. Informed patient we had mailed out a letter to her on 07/17/2019 requesting an updated EOB. Informed patient that BMS is requiring the EOB from 2020 not from 2019 that she previously sent in. Patient informed she has not seen the letter and that it was probably thrown in the trash on accident.Patient requested the letter be remailed out and I iInformed patient I would mail her another letter with the details.  Plan: Mail patient the letter again requesting 2020 EOB from [employer] on 07/30/2019.  Will followup with patient in 5-10 business days to inquire if letter was received.  Bentlie Catanzaro P. Preethi Scantlebury, [place] Management [phone]

## 2019-08-01 DIAGNOSIS — Z433 Encounter for attention to colostomy: Secondary | ICD-10-CM | POA: Diagnosis not present

## 2019-08-01 DIAGNOSIS — M1990 Unspecified osteoarthritis, unspecified site: Secondary | ICD-10-CM | POA: Diagnosis not present

## 2019-08-01 DIAGNOSIS — I5032 Chronic diastolic (congestive) heart failure: Secondary | ICD-10-CM | POA: Diagnosis not present

## 2019-08-01 DIAGNOSIS — N183 Chronic kidney disease, stage 3 unspecified: Secondary | ICD-10-CM | POA: Diagnosis not present

## 2019-08-01 DIAGNOSIS — C787 Secondary malignant neoplasm of liver and intrahepatic bile duct: Secondary | ICD-10-CM | POA: Diagnosis not present

## 2019-08-01 DIAGNOSIS — C189 Malignant neoplasm of colon, unspecified: Secondary | ICD-10-CM | POA: Diagnosis not present

## 2019-08-01 DIAGNOSIS — E1122 Type 2 diabetes mellitus with diabetic chronic kidney disease: Secondary | ICD-10-CM | POA: Diagnosis not present

## 2019-08-01 DIAGNOSIS — I4891 Unspecified atrial fibrillation: Secondary | ICD-10-CM | POA: Diagnosis not present

## 2019-08-01 DIAGNOSIS — I130 Hypertensive heart and chronic kidney disease with heart failure and stage 1 through stage 4 chronic kidney disease, or unspecified chronic kidney disease: Secondary | ICD-10-CM | POA: Diagnosis not present

## 2019-08-04 DIAGNOSIS — N183 Chronic kidney disease, stage 3 unspecified: Secondary | ICD-10-CM | POA: Diagnosis not present

## 2019-08-04 DIAGNOSIS — I130 Hypertensive heart and chronic kidney disease with heart failure and stage 1 through stage 4 chronic kidney disease, or unspecified chronic kidney disease: Secondary | ICD-10-CM | POA: Diagnosis not present

## 2019-08-04 DIAGNOSIS — I5032 Chronic diastolic (congestive) heart failure: Secondary | ICD-10-CM | POA: Diagnosis not present

## 2019-08-04 DIAGNOSIS — E1122 Type 2 diabetes mellitus with diabetic chronic kidney disease: Secondary | ICD-10-CM | POA: Diagnosis not present

## 2019-08-04 DIAGNOSIS — Z433 Encounter for attention to colostomy: Secondary | ICD-10-CM | POA: Diagnosis not present

## 2019-08-04 DIAGNOSIS — C787 Secondary malignant neoplasm of liver and intrahepatic bile duct: Secondary | ICD-10-CM | POA: Diagnosis not present

## 2019-08-04 DIAGNOSIS — M1990 Unspecified osteoarthritis, unspecified site: Secondary | ICD-10-CM | POA: Diagnosis not present

## 2019-08-04 DIAGNOSIS — I4891 Unspecified atrial fibrillation: Secondary | ICD-10-CM | POA: Diagnosis not present

## 2019-08-04 DIAGNOSIS — C189 Malignant neoplasm of colon, unspecified: Secondary | ICD-10-CM | POA: Diagnosis not present

## 2019-08-05 ENCOUNTER — Other Ambulatory Visit: Payer: Medicare HMO

## 2019-08-05 ENCOUNTER — Telehealth: Payer: Self-pay | Admitting: Nurse Practitioner

## 2019-08-05 DIAGNOSIS — I4891 Unspecified atrial fibrillation: Secondary | ICD-10-CM | POA: Diagnosis not present

## 2019-08-05 DIAGNOSIS — C189 Malignant neoplasm of colon, unspecified: Secondary | ICD-10-CM | POA: Diagnosis not present

## 2019-08-05 DIAGNOSIS — Z433 Encounter for attention to colostomy: Secondary | ICD-10-CM | POA: Diagnosis not present

## 2019-08-05 DIAGNOSIS — C787 Secondary malignant neoplasm of liver and intrahepatic bile duct: Secondary | ICD-10-CM | POA: Diagnosis not present

## 2019-08-05 DIAGNOSIS — N183 Chronic kidney disease, stage 3 unspecified: Secondary | ICD-10-CM | POA: Diagnosis not present

## 2019-08-05 DIAGNOSIS — E1122 Type 2 diabetes mellitus with diabetic chronic kidney disease: Secondary | ICD-10-CM | POA: Diagnosis not present

## 2019-08-05 DIAGNOSIS — I5032 Chronic diastolic (congestive) heart failure: Secondary | ICD-10-CM | POA: Diagnosis not present

## 2019-08-05 DIAGNOSIS — I130 Hypertensive heart and chronic kidney disease with heart failure and stage 1 through stage 4 chronic kidney disease, or unspecified chronic kidney disease: Secondary | ICD-10-CM | POA: Diagnosis not present

## 2019-08-05 DIAGNOSIS — M1990 Unspecified osteoarthritis, unspecified site: Secondary | ICD-10-CM | POA: Diagnosis not present

## 2019-08-05 NOTE — Telephone Encounter (Signed)
Scheduled appt per 9/22 sch message - pt aware of appt date and time   

## 2019-08-06 ENCOUNTER — Ambulatory Visit: Payer: Self-pay | Admitting: Pharmacist

## 2019-08-07 DIAGNOSIS — I5032 Chronic diastolic (congestive) heart failure: Secondary | ICD-10-CM | POA: Diagnosis not present

## 2019-08-07 DIAGNOSIS — E1122 Type 2 diabetes mellitus with diabetic chronic kidney disease: Secondary | ICD-10-CM | POA: Diagnosis not present

## 2019-08-07 DIAGNOSIS — M1990 Unspecified osteoarthritis, unspecified site: Secondary | ICD-10-CM | POA: Diagnosis not present

## 2019-08-07 DIAGNOSIS — C787 Secondary malignant neoplasm of liver and intrahepatic bile duct: Secondary | ICD-10-CM | POA: Diagnosis not present

## 2019-08-07 DIAGNOSIS — I130 Hypertensive heart and chronic kidney disease with heart failure and stage 1 through stage 4 chronic kidney disease, or unspecified chronic kidney disease: Secondary | ICD-10-CM | POA: Diagnosis not present

## 2019-08-07 DIAGNOSIS — C189 Malignant neoplasm of colon, unspecified: Secondary | ICD-10-CM | POA: Diagnosis not present

## 2019-08-07 DIAGNOSIS — N183 Chronic kidney disease, stage 3 unspecified: Secondary | ICD-10-CM | POA: Diagnosis not present

## 2019-08-07 DIAGNOSIS — I4891 Unspecified atrial fibrillation: Secondary | ICD-10-CM | POA: Diagnosis not present

## 2019-08-07 DIAGNOSIS — Z433 Encounter for attention to colostomy: Secondary | ICD-10-CM | POA: Diagnosis not present

## 2019-08-08 ENCOUNTER — Ambulatory Visit: Payer: Self-pay | Admitting: Pharmacist

## 2019-08-08 ENCOUNTER — Other Ambulatory Visit: Payer: Self-pay | Admitting: Pharmacy Technician

## 2019-08-08 DIAGNOSIS — Z433 Encounter for attention to colostomy: Secondary | ICD-10-CM | POA: Diagnosis not present

## 2019-08-08 DIAGNOSIS — C787 Secondary malignant neoplasm of liver and intrahepatic bile duct: Secondary | ICD-10-CM | POA: Diagnosis not present

## 2019-08-08 DIAGNOSIS — I130 Hypertensive heart and chronic kidney disease with heart failure and stage 1 through stage 4 chronic kidney disease, or unspecified chronic kidney disease: Secondary | ICD-10-CM | POA: Diagnosis not present

## 2019-08-08 DIAGNOSIS — N183 Chronic kidney disease, stage 3 unspecified: Secondary | ICD-10-CM | POA: Diagnosis not present

## 2019-08-08 DIAGNOSIS — M1990 Unspecified osteoarthritis, unspecified site: Secondary | ICD-10-CM | POA: Diagnosis not present

## 2019-08-08 DIAGNOSIS — E1122 Type 2 diabetes mellitus with diabetic chronic kidney disease: Secondary | ICD-10-CM | POA: Diagnosis not present

## 2019-08-08 DIAGNOSIS — C189 Malignant neoplasm of colon, unspecified: Secondary | ICD-10-CM | POA: Diagnosis not present

## 2019-08-08 DIAGNOSIS — I5032 Chronic diastolic (congestive) heart failure: Secondary | ICD-10-CM | POA: Diagnosis not present

## 2019-08-08 DIAGNOSIS — I4891 Unspecified atrial fibrillation: Secondary | ICD-10-CM | POA: Diagnosis not present

## 2019-08-08 NOTE — Patient Outreach (Signed)
[facility]) Care Management  08/08/2019  Cassandra Allen [facility] 1946-02-17 [id number]   Successful outreach call placed to patient in regards to BMS application for Eliquis.  Spoke to patient, HIPAA identifiers verified.  Patient informed she has not received the letter with the enclosed envelope that was mailed to her on 93/2020. Patient also informed she has not received a recent [employer] eob.  Informed patient that she would need to save her next EOB from [facility] or her Smart Summary that she should get monthly. Informed patient that would need to be mailed back to [place] so that it can be submitted to BMS as proof of patient meeting the requirement of 3% OOP. Informed patient that when she receives it to call me and I can provide her the address to mail it back to if the envelope does not show up. Patient verbalized understanding and confirmed having phone number.  Will followup with patient in 15 business days if call is not returned.  Cassandra Allen, [place] Management [phone]

## 2019-08-11 DIAGNOSIS — R001 Bradycardia, unspecified: Secondary | ICD-10-CM | POA: Diagnosis not present

## 2019-08-11 DIAGNOSIS — I10 Essential (primary) hypertension: Secondary | ICD-10-CM | POA: Diagnosis not present

## 2019-08-11 DIAGNOSIS — S0101XA Laceration without foreign body of scalp, initial encounter: Secondary | ICD-10-CM | POA: Diagnosis not present

## 2019-08-11 DIAGNOSIS — I509 Heart failure, unspecified: Secondary | ICD-10-CM | POA: Diagnosis not present

## 2019-08-11 DIAGNOSIS — I495 Sick sinus syndrome: Secondary | ICD-10-CM | POA: Diagnosis not present

## 2019-08-11 DIAGNOSIS — I5033 Acute on chronic diastolic (congestive) heart failure: Secondary | ICD-10-CM | POA: Diagnosis not present

## 2019-08-11 DIAGNOSIS — L02211 Cutaneous abscess of abdominal wall: Secondary | ICD-10-CM | POA: Diagnosis not present

## 2019-08-11 DIAGNOSIS — J9611 Chronic respiratory failure with hypoxia: Secondary | ICD-10-CM | POA: Diagnosis not present

## 2019-08-11 DIAGNOSIS — G4751 Confusional arousals: Secondary | ICD-10-CM | POA: Diagnosis not present

## 2019-08-11 DIAGNOSIS — R0602 Shortness of breath: Secondary | ICD-10-CM | POA: Diagnosis not present

## 2019-08-11 DIAGNOSIS — F331 Major depressive disorder, recurrent, moderate: Secondary | ICD-10-CM | POA: Diagnosis not present

## 2019-08-11 DIAGNOSIS — I4821 Permanent atrial fibrillation: Secondary | ICD-10-CM | POA: Diagnosis not present

## 2019-08-11 DIAGNOSIS — I517 Cardiomegaly: Secondary | ICD-10-CM | POA: Diagnosis not present

## 2019-08-11 DIAGNOSIS — L03115 Cellulitis of right lower limb: Secondary | ICD-10-CM | POA: Diagnosis not present

## 2019-08-11 DIAGNOSIS — R55 Syncope and collapse: Secondary | ICD-10-CM | POA: Diagnosis not present

## 2019-08-11 DIAGNOSIS — R296 Repeated falls: Secondary | ICD-10-CM | POA: Diagnosis not present

## 2019-08-11 DIAGNOSIS — C801 Malignant (primary) neoplasm, unspecified: Secondary | ICD-10-CM | POA: Diagnosis not present

## 2019-08-11 DIAGNOSIS — I110 Hypertensive heart disease with heart failure: Secondary | ICD-10-CM | POA: Diagnosis not present

## 2019-08-11 DIAGNOSIS — C189 Malignant neoplasm of colon, unspecified: Secondary | ICD-10-CM | POA: Diagnosis not present

## 2019-08-11 DIAGNOSIS — N183 Chronic kidney disease, stage 3 unspecified: Secondary | ICD-10-CM | POA: Diagnosis not present

## 2019-08-11 DIAGNOSIS — R531 Weakness: Secondary | ICD-10-CM | POA: Diagnosis not present

## 2019-08-11 DIAGNOSIS — K651 Peritoneal abscess: Secondary | ICD-10-CM | POA: Diagnosis not present

## 2019-08-11 DIAGNOSIS — M1990 Unspecified osteoarthritis, unspecified site: Secondary | ICD-10-CM | POA: Diagnosis not present

## 2019-08-11 DIAGNOSIS — R441 Visual hallucinations: Secondary | ICD-10-CM | POA: Diagnosis not present

## 2019-08-11 DIAGNOSIS — I5032 Chronic diastolic (congestive) heart failure: Secondary | ICD-10-CM | POA: Diagnosis not present

## 2019-08-11 DIAGNOSIS — S0003XA Contusion of scalp, initial encounter: Secondary | ICD-10-CM | POA: Diagnosis not present

## 2019-08-11 DIAGNOSIS — I219 Acute myocardial infarction, unspecified: Secondary | ICD-10-CM | POA: Diagnosis not present

## 2019-08-11 DIAGNOSIS — D649 Anemia, unspecified: Secondary | ICD-10-CM | POA: Diagnosis not present

## 2019-08-11 DIAGNOSIS — C787 Secondary malignant neoplasm of liver and intrahepatic bile duct: Secondary | ICD-10-CM | POA: Diagnosis not present

## 2019-08-11 DIAGNOSIS — Z789 Other specified health status: Secondary | ICD-10-CM | POA: Diagnosis not present

## 2019-08-11 DIAGNOSIS — R188 Other ascites: Secondary | ICD-10-CM | POA: Diagnosis not present

## 2019-08-11 DIAGNOSIS — E1122 Type 2 diabetes mellitus with diabetic chronic kidney disease: Secondary | ICD-10-CM | POA: Diagnosis not present

## 2019-08-11 DIAGNOSIS — F329 Major depressive disorder, single episode, unspecified: Secondary | ICD-10-CM | POA: Diagnosis not present

## 2019-08-11 DIAGNOSIS — Z433 Encounter for attention to colostomy: Secondary | ICD-10-CM | POA: Diagnosis not present

## 2019-08-11 DIAGNOSIS — I119 Hypertensive heart disease without heart failure: Secondary | ICD-10-CM | POA: Diagnosis not present

## 2019-08-11 DIAGNOSIS — I130 Hypertensive heart and chronic kidney disease with heart failure and stage 1 through stage 4 chronic kidney disease, or unspecified chronic kidney disease: Secondary | ICD-10-CM | POA: Diagnosis not present

## 2019-08-11 DIAGNOSIS — I4891 Unspecified atrial fibrillation: Secondary | ICD-10-CM | POA: Diagnosis not present

## 2019-08-11 DIAGNOSIS — W010XXA Fall on same level from slipping, tripping and stumbling without subsequent striking against object, initial encounter: Secondary | ICD-10-CM | POA: Diagnosis not present

## 2019-08-11 DIAGNOSIS — L03116 Cellulitis of left lower limb: Secondary | ICD-10-CM | POA: Diagnosis not present

## 2019-08-11 DIAGNOSIS — R062 Wheezing: Secondary | ICD-10-CM | POA: Diagnosis not present

## 2019-08-11 DIAGNOSIS — R9431 Abnormal electrocardiogram [ECG] [EKG]: Secondary | ICD-10-CM | POA: Diagnosis not present

## 2019-08-11 DIAGNOSIS — Z7409 Other reduced mobility: Secondary | ICD-10-CM | POA: Diagnosis not present

## 2019-08-11 DIAGNOSIS — E119 Type 2 diabetes mellitus without complications: Secondary | ICD-10-CM | POA: Diagnosis not present

## 2019-08-11 DIAGNOSIS — Y998 Other external cause status: Secondary | ICD-10-CM | POA: Diagnosis not present

## 2019-08-11 DIAGNOSIS — F0390 Unspecified dementia without behavioral disturbance: Secondary | ICD-10-CM | POA: Diagnosis not present

## 2019-08-11 DIAGNOSIS — R5381 Other malaise: Secondary | ICD-10-CM | POA: Diagnosis not present

## 2019-08-11 DIAGNOSIS — T8183XA Persistent postprocedural fistula, initial encounter: Secondary | ICD-10-CM | POA: Diagnosis not present

## 2019-08-11 DIAGNOSIS — I480 Paroxysmal atrial fibrillation: Secondary | ICD-10-CM | POA: Diagnosis not present

## 2019-08-12 DIAGNOSIS — I110 Hypertensive heart disease with heart failure: Secondary | ICD-10-CM | POA: Diagnosis not present

## 2019-08-12 DIAGNOSIS — R5381 Other malaise: Secondary | ICD-10-CM | POA: Diagnosis not present

## 2019-08-12 DIAGNOSIS — T8183XA Persistent postprocedural fistula, initial encounter: Secondary | ICD-10-CM | POA: Diagnosis not present

## 2019-08-12 DIAGNOSIS — I5032 Chronic diastolic (congestive) heart failure: Secondary | ICD-10-CM | POA: Diagnosis not present

## 2019-08-12 DIAGNOSIS — R55 Syncope and collapse: Secondary | ICD-10-CM | POA: Diagnosis not present

## 2019-08-12 DIAGNOSIS — I480 Paroxysmal atrial fibrillation: Secondary | ICD-10-CM | POA: Diagnosis not present

## 2019-08-12 DIAGNOSIS — K651 Peritoneal abscess: Secondary | ICD-10-CM | POA: Diagnosis not present

## 2019-08-12 DIAGNOSIS — R188 Other ascites: Secondary | ICD-10-CM | POA: Diagnosis not present

## 2019-08-13 DIAGNOSIS — I5032 Chronic diastolic (congestive) heart failure: Secondary | ICD-10-CM | POA: Diagnosis not present

## 2019-08-13 DIAGNOSIS — I219 Acute myocardial infarction, unspecified: Secondary | ICD-10-CM | POA: Diagnosis not present

## 2019-08-13 DIAGNOSIS — E119 Type 2 diabetes mellitus without complications: Secondary | ICD-10-CM | POA: Diagnosis not present

## 2019-08-13 DIAGNOSIS — R296 Repeated falls: Secondary | ICD-10-CM | POA: Diagnosis not present

## 2019-08-13 DIAGNOSIS — I4821 Permanent atrial fibrillation: Secondary | ICD-10-CM | POA: Diagnosis not present

## 2019-08-13 DIAGNOSIS — I517 Cardiomegaly: Secondary | ICD-10-CM | POA: Diagnosis not present

## 2019-08-13 DIAGNOSIS — I480 Paroxysmal atrial fibrillation: Secondary | ICD-10-CM | POA: Diagnosis not present

## 2019-08-13 DIAGNOSIS — R001 Bradycardia, unspecified: Secondary | ICD-10-CM | POA: Diagnosis not present

## 2019-08-13 DIAGNOSIS — J9611 Chronic respiratory failure with hypoxia: Secondary | ICD-10-CM | POA: Diagnosis not present

## 2019-08-13 DIAGNOSIS — I4891 Unspecified atrial fibrillation: Secondary | ICD-10-CM | POA: Diagnosis not present

## 2019-08-13 DIAGNOSIS — I119 Hypertensive heart disease without heart failure: Secondary | ICD-10-CM | POA: Diagnosis not present

## 2019-08-14 DIAGNOSIS — E119 Type 2 diabetes mellitus without complications: Secondary | ICD-10-CM | POA: Diagnosis not present

## 2019-08-14 DIAGNOSIS — I5032 Chronic diastolic (congestive) heart failure: Secondary | ICD-10-CM | POA: Diagnosis not present

## 2019-08-14 DIAGNOSIS — I4821 Permanent atrial fibrillation: Secondary | ICD-10-CM | POA: Diagnosis not present

## 2019-08-14 DIAGNOSIS — I119 Hypertensive heart disease without heart failure: Secondary | ICD-10-CM | POA: Diagnosis not present

## 2019-08-14 DIAGNOSIS — D649 Anemia, unspecified: Secondary | ICD-10-CM | POA: Diagnosis not present

## 2019-08-14 DIAGNOSIS — J9611 Chronic respiratory failure with hypoxia: Secondary | ICD-10-CM | POA: Diagnosis not present

## 2019-08-14 DIAGNOSIS — Z7409 Other reduced mobility: Secondary | ICD-10-CM | POA: Diagnosis not present

## 2019-08-14 DIAGNOSIS — I495 Sick sinus syndrome: Secondary | ICD-10-CM | POA: Diagnosis not present

## 2019-08-14 DIAGNOSIS — R001 Bradycardia, unspecified: Secondary | ICD-10-CM | POA: Diagnosis not present

## 2019-08-14 DIAGNOSIS — I480 Paroxysmal atrial fibrillation: Secondary | ICD-10-CM | POA: Diagnosis not present

## 2019-08-14 DIAGNOSIS — R296 Repeated falls: Secondary | ICD-10-CM | POA: Diagnosis not present

## 2019-08-14 DIAGNOSIS — I517 Cardiomegaly: Secondary | ICD-10-CM | POA: Diagnosis not present

## 2019-08-15 DIAGNOSIS — I119 Hypertensive heart disease without heart failure: Secondary | ICD-10-CM | POA: Diagnosis not present

## 2019-08-15 DIAGNOSIS — R062 Wheezing: Secondary | ICD-10-CM | POA: Diagnosis not present

## 2019-08-15 DIAGNOSIS — I495 Sick sinus syndrome: Secondary | ICD-10-CM | POA: Diagnosis not present

## 2019-08-15 DIAGNOSIS — D649 Anemia, unspecified: Secondary | ICD-10-CM | POA: Diagnosis not present

## 2019-08-15 DIAGNOSIS — I4891 Unspecified atrial fibrillation: Secondary | ICD-10-CM | POA: Diagnosis not present

## 2019-08-15 DIAGNOSIS — Z7409 Other reduced mobility: Secondary | ICD-10-CM | POA: Diagnosis not present

## 2019-08-15 DIAGNOSIS — I5033 Acute on chronic diastolic (congestive) heart failure: Secondary | ICD-10-CM | POA: Diagnosis not present

## 2019-08-15 DIAGNOSIS — F0390 Unspecified dementia without behavioral disturbance: Secondary | ICD-10-CM | POA: Diagnosis not present

## 2019-08-15 DIAGNOSIS — J9611 Chronic respiratory failure with hypoxia: Secondary | ICD-10-CM | POA: Diagnosis not present

## 2019-08-15 DIAGNOSIS — I4821 Permanent atrial fibrillation: Secondary | ICD-10-CM | POA: Diagnosis not present

## 2019-08-15 DIAGNOSIS — R001 Bradycardia, unspecified: Secondary | ICD-10-CM | POA: Diagnosis not present

## 2019-08-15 DIAGNOSIS — I517 Cardiomegaly: Secondary | ICD-10-CM | POA: Diagnosis not present

## 2019-08-15 DIAGNOSIS — R296 Repeated falls: Secondary | ICD-10-CM | POA: Diagnosis not present

## 2019-08-15 DIAGNOSIS — F331 Major depressive disorder, recurrent, moderate: Secondary | ICD-10-CM | POA: Diagnosis not present

## 2019-08-16 DIAGNOSIS — I10 Essential (primary) hypertension: Secondary | ICD-10-CM | POA: Diagnosis not present

## 2019-08-16 DIAGNOSIS — F331 Major depressive disorder, recurrent, moderate: Secondary | ICD-10-CM | POA: Diagnosis not present

## 2019-08-16 DIAGNOSIS — R0602 Shortness of breath: Secondary | ICD-10-CM | POA: Diagnosis not present

## 2019-08-16 DIAGNOSIS — I5033 Acute on chronic diastolic (congestive) heart failure: Secondary | ICD-10-CM | POA: Diagnosis not present

## 2019-08-16 DIAGNOSIS — F0390 Unspecified dementia without behavioral disturbance: Secondary | ICD-10-CM | POA: Diagnosis not present

## 2019-08-16 DIAGNOSIS — R441 Visual hallucinations: Secondary | ICD-10-CM | POA: Diagnosis not present

## 2019-08-16 DIAGNOSIS — R296 Repeated falls: Secondary | ICD-10-CM | POA: Diagnosis not present

## 2019-08-16 DIAGNOSIS — I4821 Permanent atrial fibrillation: Secondary | ICD-10-CM | POA: Diagnosis not present

## 2019-08-16 DIAGNOSIS — J9611 Chronic respiratory failure with hypoxia: Secondary | ICD-10-CM | POA: Diagnosis not present

## 2019-08-16 DIAGNOSIS — I4891 Unspecified atrial fibrillation: Secondary | ICD-10-CM | POA: Diagnosis not present

## 2019-08-16 DIAGNOSIS — R001 Bradycardia, unspecified: Secondary | ICD-10-CM | POA: Diagnosis not present

## 2019-08-16 DIAGNOSIS — I495 Sick sinus syndrome: Secondary | ICD-10-CM | POA: Diagnosis not present

## 2019-08-16 DIAGNOSIS — G4751 Confusional arousals: Secondary | ICD-10-CM | POA: Diagnosis not present

## 2019-08-16 DIAGNOSIS — D649 Anemia, unspecified: Secondary | ICD-10-CM | POA: Diagnosis not present

## 2019-08-17 DIAGNOSIS — R0602 Shortness of breath: Secondary | ICD-10-CM | POA: Diagnosis not present

## 2019-08-17 DIAGNOSIS — I110 Hypertensive heart disease with heart failure: Secondary | ICD-10-CM | POA: Diagnosis not present

## 2019-08-17 DIAGNOSIS — R001 Bradycardia, unspecified: Secondary | ICD-10-CM | POA: Diagnosis not present

## 2019-08-17 DIAGNOSIS — I495 Sick sinus syndrome: Secondary | ICD-10-CM | POA: Diagnosis not present

## 2019-08-17 DIAGNOSIS — G4751 Confusional arousals: Secondary | ICD-10-CM | POA: Diagnosis not present

## 2019-08-17 DIAGNOSIS — I5033 Acute on chronic diastolic (congestive) heart failure: Secondary | ICD-10-CM | POA: Diagnosis not present

## 2019-08-17 DIAGNOSIS — I10 Essential (primary) hypertension: Secondary | ICD-10-CM | POA: Diagnosis not present

## 2019-08-17 DIAGNOSIS — F0390 Unspecified dementia without behavioral disturbance: Secondary | ICD-10-CM | POA: Diagnosis not present

## 2019-08-17 DIAGNOSIS — F331 Major depressive disorder, recurrent, moderate: Secondary | ICD-10-CM | POA: Diagnosis not present

## 2019-08-17 DIAGNOSIS — I4821 Permanent atrial fibrillation: Secondary | ICD-10-CM | POA: Diagnosis not present

## 2019-08-17 DIAGNOSIS — J9611 Chronic respiratory failure with hypoxia: Secondary | ICD-10-CM | POA: Diagnosis not present

## 2019-08-17 DIAGNOSIS — D649 Anemia, unspecified: Secondary | ICD-10-CM | POA: Diagnosis not present

## 2019-08-17 DIAGNOSIS — I4891 Unspecified atrial fibrillation: Secondary | ICD-10-CM | POA: Diagnosis not present

## 2019-08-18 DIAGNOSIS — J9611 Chronic respiratory failure with hypoxia: Secondary | ICD-10-CM | POA: Diagnosis not present

## 2019-08-18 DIAGNOSIS — I110 Hypertensive heart disease with heart failure: Secondary | ICD-10-CM | POA: Diagnosis not present

## 2019-08-18 DIAGNOSIS — I4891 Unspecified atrial fibrillation: Secondary | ICD-10-CM | POA: Diagnosis not present

## 2019-08-18 DIAGNOSIS — I119 Hypertensive heart disease without heart failure: Secondary | ICD-10-CM | POA: Diagnosis not present

## 2019-08-18 DIAGNOSIS — I517 Cardiomegaly: Secondary | ICD-10-CM | POA: Diagnosis not present

## 2019-08-18 DIAGNOSIS — I5033 Acute on chronic diastolic (congestive) heart failure: Secondary | ICD-10-CM | POA: Diagnosis not present

## 2019-08-18 DIAGNOSIS — D649 Anemia, unspecified: Secondary | ICD-10-CM | POA: Diagnosis not present

## 2019-08-18 DIAGNOSIS — R296 Repeated falls: Secondary | ICD-10-CM | POA: Diagnosis not present

## 2019-08-18 DIAGNOSIS — I495 Sick sinus syndrome: Secondary | ICD-10-CM | POA: Diagnosis not present

## 2019-08-18 DIAGNOSIS — Z7409 Other reduced mobility: Secondary | ICD-10-CM | POA: Diagnosis not present

## 2019-08-18 DIAGNOSIS — I4821 Permanent atrial fibrillation: Secondary | ICD-10-CM | POA: Diagnosis not present

## 2019-08-18 DIAGNOSIS — I509 Heart failure, unspecified: Secondary | ICD-10-CM | POA: Diagnosis not present

## 2019-08-19 DIAGNOSIS — I4891 Unspecified atrial fibrillation: Secondary | ICD-10-CM | POA: Diagnosis not present

## 2019-08-19 DIAGNOSIS — R001 Bradycardia, unspecified: Secondary | ICD-10-CM | POA: Diagnosis not present

## 2019-08-19 DIAGNOSIS — I495 Sick sinus syndrome: Secondary | ICD-10-CM | POA: Diagnosis not present

## 2019-08-19 DIAGNOSIS — I5033 Acute on chronic diastolic (congestive) heart failure: Secondary | ICD-10-CM | POA: Diagnosis not present

## 2019-08-20 ENCOUNTER — Telehealth: Payer: Self-pay

## 2019-08-20 NOTE — Telephone Encounter (Signed)
Patient left a voicemail stating she needed to cancel her appointments for 08/21/19 because she was in the hospital. She stated she would call to reschedule appointments when she was discharged. Will make Dr. Burr Medico and Regan Rakers Burton-NP aware

## 2019-08-21 ENCOUNTER — Inpatient Hospital Stay: Payer: Medicare HMO | Admitting: Nurse Practitioner

## 2019-08-21 ENCOUNTER — Inpatient Hospital Stay: Payer: Medicare HMO

## 2019-08-21 DIAGNOSIS — M1990 Unspecified osteoarthritis, unspecified site: Secondary | ICD-10-CM | POA: Diagnosis not present

## 2019-08-21 DIAGNOSIS — N183 Chronic kidney disease, stage 3 unspecified: Secondary | ICD-10-CM | POA: Diagnosis not present

## 2019-08-21 DIAGNOSIS — C189 Malignant neoplasm of colon, unspecified: Secondary | ICD-10-CM | POA: Diagnosis not present

## 2019-08-21 DIAGNOSIS — I4891 Unspecified atrial fibrillation: Secondary | ICD-10-CM | POA: Diagnosis not present

## 2019-08-21 DIAGNOSIS — I130 Hypertensive heart and chronic kidney disease with heart failure and stage 1 through stage 4 chronic kidney disease, or unspecified chronic kidney disease: Secondary | ICD-10-CM | POA: Diagnosis not present

## 2019-08-21 DIAGNOSIS — F329 Major depressive disorder, single episode, unspecified: Secondary | ICD-10-CM | POA: Diagnosis not present

## 2019-08-21 DIAGNOSIS — C787 Secondary malignant neoplasm of liver and intrahepatic bile duct: Secondary | ICD-10-CM | POA: Diagnosis not present

## 2019-08-21 DIAGNOSIS — Z433 Encounter for attention to colostomy: Secondary | ICD-10-CM | POA: Diagnosis not present

## 2019-08-21 DIAGNOSIS — I5032 Chronic diastolic (congestive) heart failure: Secondary | ICD-10-CM | POA: Diagnosis not present

## 2019-08-21 DIAGNOSIS — E1122 Type 2 diabetes mellitus with diabetic chronic kidney disease: Secondary | ICD-10-CM | POA: Diagnosis not present

## 2019-08-26 DIAGNOSIS — I5032 Chronic diastolic (congestive) heart failure: Secondary | ICD-10-CM | POA: Diagnosis not present

## 2019-08-26 DIAGNOSIS — I4891 Unspecified atrial fibrillation: Secondary | ICD-10-CM | POA: Diagnosis not present

## 2019-08-26 DIAGNOSIS — C189 Malignant neoplasm of colon, unspecified: Secondary | ICD-10-CM | POA: Diagnosis not present

## 2019-08-26 DIAGNOSIS — Z433 Encounter for attention to colostomy: Secondary | ICD-10-CM | POA: Diagnosis not present

## 2019-08-26 DIAGNOSIS — E1122 Type 2 diabetes mellitus with diabetic chronic kidney disease: Secondary | ICD-10-CM | POA: Diagnosis not present

## 2019-08-26 DIAGNOSIS — I130 Hypertensive heart and chronic kidney disease with heart failure and stage 1 through stage 4 chronic kidney disease, or unspecified chronic kidney disease: Secondary | ICD-10-CM | POA: Diagnosis not present

## 2019-08-26 DIAGNOSIS — C787 Secondary malignant neoplasm of liver and intrahepatic bile duct: Secondary | ICD-10-CM | POA: Diagnosis not present

## 2019-08-26 DIAGNOSIS — N183 Chronic kidney disease, stage 3 unspecified: Secondary | ICD-10-CM | POA: Diagnosis not present

## 2019-08-26 DIAGNOSIS — M1990 Unspecified osteoarthritis, unspecified site: Secondary | ICD-10-CM | POA: Diagnosis not present

## 2019-08-27 DIAGNOSIS — E1122 Type 2 diabetes mellitus with diabetic chronic kidney disease: Secondary | ICD-10-CM | POA: Diagnosis not present

## 2019-08-27 DIAGNOSIS — C189 Malignant neoplasm of colon, unspecified: Secondary | ICD-10-CM | POA: Diagnosis not present

## 2019-08-27 DIAGNOSIS — Z433 Encounter for attention to colostomy: Secondary | ICD-10-CM | POA: Diagnosis not present

## 2019-08-27 DIAGNOSIS — N183 Chronic kidney disease, stage 3 unspecified: Secondary | ICD-10-CM | POA: Diagnosis not present

## 2019-08-27 DIAGNOSIS — C787 Secondary malignant neoplasm of liver and intrahepatic bile duct: Secondary | ICD-10-CM | POA: Diagnosis not present

## 2019-08-27 DIAGNOSIS — I130 Hypertensive heart and chronic kidney disease with heart failure and stage 1 through stage 4 chronic kidney disease, or unspecified chronic kidney disease: Secondary | ICD-10-CM | POA: Diagnosis not present

## 2019-08-27 DIAGNOSIS — I4891 Unspecified atrial fibrillation: Secondary | ICD-10-CM | POA: Diagnosis not present

## 2019-08-27 DIAGNOSIS — M1990 Unspecified osteoarthritis, unspecified site: Secondary | ICD-10-CM | POA: Diagnosis not present

## 2019-08-27 DIAGNOSIS — I5032 Chronic diastolic (congestive) heart failure: Secondary | ICD-10-CM | POA: Diagnosis not present

## 2019-08-28 DIAGNOSIS — I5032 Chronic diastolic (congestive) heart failure: Secondary | ICD-10-CM | POA: Diagnosis not present

## 2019-08-28 DIAGNOSIS — E1122 Type 2 diabetes mellitus with diabetic chronic kidney disease: Secondary | ICD-10-CM | POA: Diagnosis not present

## 2019-08-28 DIAGNOSIS — I4891 Unspecified atrial fibrillation: Secondary | ICD-10-CM | POA: Diagnosis not present

## 2019-08-28 DIAGNOSIS — N183 Chronic kidney disease, stage 3 unspecified: Secondary | ICD-10-CM | POA: Diagnosis not present

## 2019-08-28 DIAGNOSIS — Z433 Encounter for attention to colostomy: Secondary | ICD-10-CM | POA: Diagnosis not present

## 2019-08-28 DIAGNOSIS — C189 Malignant neoplasm of colon, unspecified: Secondary | ICD-10-CM | POA: Diagnosis not present

## 2019-08-28 DIAGNOSIS — M1990 Unspecified osteoarthritis, unspecified site: Secondary | ICD-10-CM | POA: Diagnosis not present

## 2019-08-28 DIAGNOSIS — I130 Hypertensive heart and chronic kidney disease with heart failure and stage 1 through stage 4 chronic kidney disease, or unspecified chronic kidney disease: Secondary | ICD-10-CM | POA: Diagnosis not present

## 2019-08-28 DIAGNOSIS — C787 Secondary malignant neoplasm of liver and intrahepatic bile duct: Secondary | ICD-10-CM | POA: Diagnosis not present

## 2019-08-29 ENCOUNTER — Other Ambulatory Visit: Payer: Self-pay | Admitting: Pharmacy Technician

## 2019-08-29 DIAGNOSIS — I130 Hypertensive heart and chronic kidney disease with heart failure and stage 1 through stage 4 chronic kidney disease, or unspecified chronic kidney disease: Secondary | ICD-10-CM | POA: Diagnosis not present

## 2019-08-29 DIAGNOSIS — I4891 Unspecified atrial fibrillation: Secondary | ICD-10-CM | POA: Diagnosis not present

## 2019-08-29 DIAGNOSIS — M1990 Unspecified osteoarthritis, unspecified site: Secondary | ICD-10-CM | POA: Diagnosis not present

## 2019-08-29 DIAGNOSIS — Z433 Encounter for attention to colostomy: Secondary | ICD-10-CM | POA: Diagnosis not present

## 2019-08-29 DIAGNOSIS — I5032 Chronic diastolic (congestive) heart failure: Secondary | ICD-10-CM | POA: Diagnosis not present

## 2019-08-29 DIAGNOSIS — C189 Malignant neoplasm of colon, unspecified: Secondary | ICD-10-CM | POA: Diagnosis not present

## 2019-08-29 DIAGNOSIS — N183 Chronic kidney disease, stage 3 unspecified: Secondary | ICD-10-CM | POA: Diagnosis not present

## 2019-08-29 DIAGNOSIS — E1122 Type 2 diabetes mellitus with diabetic chronic kidney disease: Secondary | ICD-10-CM | POA: Diagnosis not present

## 2019-08-29 DIAGNOSIS — C787 Secondary malignant neoplasm of liver and intrahepatic bile duct: Secondary | ICD-10-CM | POA: Diagnosis not present

## 2019-08-29 NOTE — Patient Outreach (Signed)
[facility]) Care Management  08/29/2019  Jaclyn Hayre [facility] 1946-04-25 [id number]   Successful outreach call placed to patient in regards to BMS application for ELiquis and [employer] application for Victoza and Levemir.  Spoke to patient, HIPAA identifiers verified.  Patient informed the Eliquis was stopped by a cardiologist a few weeks ago around the time she was hospitalized. She informed she does not know why it was stopped. Patient's medication list still shows it as an active medication. Will route note to [place].  Briefly discussed refill procedure with patient concerning the [employer] medications of Victoza and Levemir. Informed patient the provider's office should automatically receive a fax 80 days from ship date informing them the medications are due for refills. Informed patient to call provider's office for refills and to inform them she receives it through patient assistance IF she  Is getting low and has not heard form them. Patient confirmed haivng name and number for future reference.  Will route note to [place] that since patient informed Eliquis was dcd,  patient assistance is completed. Will remove myself from care team.  Luiz Ochoa. Maston Wight, [place] Management [phone]

## 2019-09-01 DIAGNOSIS — M1990 Unspecified osteoarthritis, unspecified site: Secondary | ICD-10-CM | POA: Diagnosis not present

## 2019-09-01 DIAGNOSIS — C189 Malignant neoplasm of colon, unspecified: Secondary | ICD-10-CM | POA: Diagnosis not present

## 2019-09-01 DIAGNOSIS — E1122 Type 2 diabetes mellitus with diabetic chronic kidney disease: Secondary | ICD-10-CM | POA: Diagnosis not present

## 2019-09-01 DIAGNOSIS — I4891 Unspecified atrial fibrillation: Secondary | ICD-10-CM | POA: Diagnosis not present

## 2019-09-01 DIAGNOSIS — Z433 Encounter for attention to colostomy: Secondary | ICD-10-CM | POA: Diagnosis not present

## 2019-09-01 DIAGNOSIS — I130 Hypertensive heart and chronic kidney disease with heart failure and stage 1 through stage 4 chronic kidney disease, or unspecified chronic kidney disease: Secondary | ICD-10-CM | POA: Diagnosis not present

## 2019-09-01 DIAGNOSIS — N183 Chronic kidney disease, stage 3 unspecified: Secondary | ICD-10-CM | POA: Diagnosis not present

## 2019-09-01 DIAGNOSIS — C787 Secondary malignant neoplasm of liver and intrahepatic bile duct: Secondary | ICD-10-CM | POA: Diagnosis not present

## 2019-09-01 DIAGNOSIS — I5032 Chronic diastolic (congestive) heart failure: Secondary | ICD-10-CM | POA: Diagnosis not present

## 2019-09-02 ENCOUNTER — Telehealth: Payer: Self-pay | Admitting: Pharmacist

## 2019-09-02 DIAGNOSIS — I130 Hypertensive heart and chronic kidney disease with heart failure and stage 1 through stage 4 chronic kidney disease, or unspecified chronic kidney disease: Secondary | ICD-10-CM | POA: Diagnosis not present

## 2019-09-02 DIAGNOSIS — M1990 Unspecified osteoarthritis, unspecified site: Secondary | ICD-10-CM | POA: Diagnosis not present

## 2019-09-02 DIAGNOSIS — Z433 Encounter for attention to colostomy: Secondary | ICD-10-CM | POA: Diagnosis not present

## 2019-09-02 DIAGNOSIS — N183 Chronic kidney disease, stage 3 unspecified: Secondary | ICD-10-CM | POA: Diagnosis not present

## 2019-09-02 DIAGNOSIS — E1122 Type 2 diabetes mellitus with diabetic chronic kidney disease: Secondary | ICD-10-CM | POA: Diagnosis not present

## 2019-09-02 DIAGNOSIS — C189 Malignant neoplasm of colon, unspecified: Secondary | ICD-10-CM | POA: Diagnosis not present

## 2019-09-02 DIAGNOSIS — I4891 Unspecified atrial fibrillation: Secondary | ICD-10-CM | POA: Diagnosis not present

## 2019-09-02 DIAGNOSIS — C787 Secondary malignant neoplasm of liver and intrahepatic bile duct: Secondary | ICD-10-CM | POA: Diagnosis not present

## 2019-09-02 DIAGNOSIS — I5032 Chronic diastolic (congestive) heart failure: Secondary | ICD-10-CM | POA: Diagnosis not present

## 2019-09-02 NOTE — Patient Outreach (Signed)
[facility]) Care Management  09/02/2019  Cassandra Allen [facility] 15-Apr-1946 [id number]   Patient was called to follow up. HIPAA identifiers were obtained. Patient confirmed receipt of Victoza and Levemir. Reviewed reordering through her provider.  Attempted to review patient's medications but she said she does not know any of her medications because her son Cassandra Allen is in charge of them.  She did confirm that Eliquis was stopped. The discharge summary from [facility] confirmed Eliquis had been discontinued as well.  As such, Eliquis was removed from the patient's medication list.   Called Cassandra Allen. Cassandra Allen said he was at work and could not review the patient's medications at the time of my call. He requested a call back between 10-12 tomorrow.   Plan: Call Cassandra Allen back to review the patient's medications.  [name], PharmD, [place] Clinical Pharmacist [phone]

## 2019-09-03 ENCOUNTER — Other Ambulatory Visit: Payer: Self-pay | Admitting: Pharmacist

## 2019-09-03 ENCOUNTER — Ambulatory Visit: Payer: Self-pay | Admitting: Pharmacist

## 2019-09-03 DIAGNOSIS — Z433 Encounter for attention to colostomy: Secondary | ICD-10-CM | POA: Diagnosis not present

## 2019-09-03 DIAGNOSIS — I5032 Chronic diastolic (congestive) heart failure: Secondary | ICD-10-CM | POA: Diagnosis not present

## 2019-09-03 DIAGNOSIS — E1122 Type 2 diabetes mellitus with diabetic chronic kidney disease: Secondary | ICD-10-CM | POA: Diagnosis not present

## 2019-09-03 DIAGNOSIS — C189 Malignant neoplasm of colon, unspecified: Secondary | ICD-10-CM | POA: Diagnosis not present

## 2019-09-03 DIAGNOSIS — C787 Secondary malignant neoplasm of liver and intrahepatic bile duct: Secondary | ICD-10-CM | POA: Diagnosis not present

## 2019-09-03 DIAGNOSIS — M1990 Unspecified osteoarthritis, unspecified site: Secondary | ICD-10-CM | POA: Diagnosis not present

## 2019-09-03 DIAGNOSIS — I4891 Unspecified atrial fibrillation: Secondary | ICD-10-CM | POA: Diagnosis not present

## 2019-09-03 DIAGNOSIS — N183 Chronic kidney disease, stage 3 unspecified: Secondary | ICD-10-CM | POA: Diagnosis not present

## 2019-09-03 DIAGNOSIS — I130 Hypertensive heart and chronic kidney disease with heart failure and stage 1 through stage 4 chronic kidney disease, or unspecified chronic kidney disease: Secondary | ICD-10-CM | POA: Diagnosis not present

## 2019-09-03 NOTE — Patient Outreach (Signed)
[facility]) Care Management  09/03/2019  Cassandra Allen July 25, 1946 [id number]   Patient's son (Cassandra Allen) was called regarding medication review and assistance. Unfortunately he did not answer the phone. HIPAA compliant message was left on his voicemail.  An attempt was made to complete a medication review with the patient over the phone yesterday and she said she did not know her medications and asked me to call her son, Cassandra Allen. Cassandra Allen was called. He said he was at work and asked that I give him a call today between 10 am and 12 noon. Cassandra Allen was called at 11:24am.   Plan: Await a call back Call patient/Chad back in 2-3 weeks.  [name], PharmD, [place] Clinical Pharmacist [phone]

## 2019-09-05 DIAGNOSIS — C189 Malignant neoplasm of colon, unspecified: Secondary | ICD-10-CM | POA: Diagnosis not present

## 2019-09-05 DIAGNOSIS — N183 Chronic kidney disease, stage 3 unspecified: Secondary | ICD-10-CM | POA: Diagnosis not present

## 2019-09-05 DIAGNOSIS — C787 Secondary malignant neoplasm of liver and intrahepatic bile duct: Secondary | ICD-10-CM | POA: Diagnosis not present

## 2019-09-05 DIAGNOSIS — I130 Hypertensive heart and chronic kidney disease with heart failure and stage 1 through stage 4 chronic kidney disease, or unspecified chronic kidney disease: Secondary | ICD-10-CM | POA: Diagnosis not present

## 2019-09-05 DIAGNOSIS — E1122 Type 2 diabetes mellitus with diabetic chronic kidney disease: Secondary | ICD-10-CM | POA: Diagnosis not present

## 2019-09-05 DIAGNOSIS — M1990 Unspecified osteoarthritis, unspecified site: Secondary | ICD-10-CM | POA: Diagnosis not present

## 2019-09-05 DIAGNOSIS — Z433 Encounter for attention to colostomy: Secondary | ICD-10-CM | POA: Diagnosis not present

## 2019-09-05 DIAGNOSIS — I5032 Chronic diastolic (congestive) heart failure: Secondary | ICD-10-CM | POA: Diagnosis not present

## 2019-09-05 DIAGNOSIS — I4891 Unspecified atrial fibrillation: Secondary | ICD-10-CM | POA: Diagnosis not present

## 2019-09-08 ENCOUNTER — Telehealth: Payer: Self-pay | Admitting: Hematology

## 2019-09-08 ENCOUNTER — Encounter (HOSPITAL_COMMUNITY): Payer: Self-pay | Admitting: Nurse Practitioner

## 2019-09-08 ENCOUNTER — Ambulatory Visit (HOSPITAL_COMMUNITY)
Admission: RE | Admit: 2019-09-08 | Discharge: 2019-09-08 | Disposition: A | Discharge status: Home / Self Care | Payer: Medicare HMO | Source: Ambulatory Visit | Attending: Nurse Practitioner | Admitting: Nurse Practitioner

## 2019-09-08 ENCOUNTER — Other Ambulatory Visit: Payer: Self-pay

## 2019-09-08 VITALS — BP 140/66 | HR 79 | Ht 62.0 in | Wt 153.2 lb

## 2019-09-08 DIAGNOSIS — D649 Anemia, unspecified: Secondary | ICD-10-CM | POA: Diagnosis not present

## 2019-09-08 DIAGNOSIS — E119 Type 2 diabetes mellitus without complications: Secondary | ICD-10-CM | POA: Diagnosis not present

## 2019-09-08 DIAGNOSIS — I4821 Permanent atrial fibrillation: Secondary | ICD-10-CM

## 2019-09-08 DIAGNOSIS — Z933 Colostomy status: Secondary | ICD-10-CM | POA: Insufficient documentation

## 2019-09-08 DIAGNOSIS — R296 Repeated falls: Secondary | ICD-10-CM | POA: Diagnosis not present

## 2019-09-08 DIAGNOSIS — Z888 Allergy status to other drugs, medicaments and biological substances status: Secondary | ICD-10-CM | POA: Insufficient documentation

## 2019-09-08 DIAGNOSIS — I10 Essential (primary) hypertension: Secondary | ICD-10-CM | POA: Diagnosis not present

## 2019-09-08 DIAGNOSIS — Z794 Long term (current) use of insulin: Secondary | ICD-10-CM | POA: Insufficient documentation

## 2019-09-08 DIAGNOSIS — Z79899 Other long term (current) drug therapy: Secondary | ICD-10-CM | POA: Insufficient documentation

## 2019-09-08 DIAGNOSIS — Z85038 Personal history of other malignant neoplasm of large intestine: Secondary | ICD-10-CM | POA: Insufficient documentation

## 2019-09-08 DIAGNOSIS — Z8249 Family history of ischemic heart disease and other diseases of the circulatory system: Secondary | ICD-10-CM | POA: Diagnosis not present

## 2019-09-08 DIAGNOSIS — R6889 Other general symptoms and signs: Secondary | ICD-10-CM | POA: Diagnosis not present

## 2019-09-08 NOTE — Telephone Encounter (Signed)
Scheduled appt per 10/26 sch message - pt is aware of appt date and time   

## 2019-09-08 NOTE — Progress Notes (Signed)
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 female with a h/o permanent afib,  S/p colon CA one year ago with colostomy, anemia, DM, HTN that was recently hospitalized in [facility] for fall that resulted in a head injury and stitches. Pt states that she has had 4 falls in  the last 4-6 weeks. She is now living with her son and is getting physical therapy. In care everywhere, it  is mentioned that she was taken off anticoagulation for these frequent falls and she is now no  longer on BB for bradycardia. She is rqte controlled today. SHe mentions that a new spot on her liver was seen and is being worked up.   Today, she denies symptoms of palpitations, chest pain, shortness of breath, orthopnea, PND, lower extremity edema, dizziness, presyncope, syncope, or neurologic sequela. The patient is tolerating medications without difficulties and is otherwise without complaint today.   Past Medical History:  Diagnosis Date  . Acute diastolic heart failure ([place]) 12/22/2018  . Arthritis   . Atrial fibrillation, chronic 12/22/2018  . Cancer of left colon ([place]) 10/30/2018  . Cancer of sigmoid colon  12/27/2018  . Diabetes mellitus without complication ([place])   . Hypertension   . Obesity (BMI 30-39.9) 12/27/2018   Past Surgical History:  Procedure Laterality Date  . COLONOSCOPY  10/2018   Dr [name].  Large cancer at splenic flexure,  Bulky sigmoid colon mass, Numerous polyps  . Intra-abdominal abscess drainage  01/31/2019   Abscess drainage grew Enterococcus faecalis  . IR CATHETER TUBE CHANGE  06/05/2019  . IR IMAGING GUIDED PORT INSERTION  11/20/2018  . IR RADIOLOGIST EVAL & MGMT  06/04/2019  . IR RADIOLOGIST EVAL & MGMT  06/17/2019  . LAPAROTOMY N/A 12/27/2018   Procedure: LEFT SIGMOID COLECTOMY WITH HARTMANN POUCH AND END COLOSTOMY;  Surgeon: [name], MD;  Location: [facility];  Service: General;  Laterality: N/A;     Current Outpatient Medications  Medication Sig Dispense Refill  . acetaminophen (TYLENOL) 500 MG tablet Take 500 mg by mouth every 4 (four) hours as needed for mild pain or fever.     Marland Kitchen amLODipine (NORVASC) 5 MG tablet Take 1 tablet by mouth daily.    . ferrous sulfate (KP FERROUS SULFATE) 325 (65 FE) MG tablet Take 1 tablet (325 mg total) by mouth daily with breakfast. 30 tablet 0  . furosemide (LASIX) 40 MG tablet Take 1 tablet by mouth daily.    . Insulin Detemir (LEVEMIR) 100 UNIT/ML Pen Inject 16 Units into the skin daily. 15 mL 0  . liraglutide (VICTOZA) 18 MG/3ML SOPN Inject 0.3 mLs (1.8 mg total) into the skin every morning. 5 pen 0  . ondansetron (ZOFRAN) 8 MG tablet Take 1 tablet by mouth every 8 (eight) hours as needed for nausea.    Marland Kitchen oxybutynin (DITROPAN-XL) 10 MG 24 hr tablet Take 1 tablet (10 mg total) by mouth daily. 30 tablet 0  . QUEtiapine (SEROQUEL) 25 MG tablet Take 0.5 tablets by mouth daily.    . sertraline (ZOLOFT) 100 MG tablet Take 1 tablet (100 mg total) by mouth daily. TAKE 1 TABLET BY MOUTH ONCE DAILY (TAKE WITH 25MG  TO=125MG ) FOR DEPRESSION (Patient taking differently: Take 100 mg by mouth daily. Taking 125mg  daily) 30 tablet 0  . simvastatin (ZOCOR) 10 MG tablet Take 1 tablet (10 mg total) by mouth daily at 6 PM. (Patient taking differently:  Take 20 mg by mouth daily at 6 PM. ) 30 tablet 0  . traMADol (ULTRAM) 50 MG tablet Take 1 tablet (50 mg total) by mouth every 6 (six) hours as needed for moderate pain or severe pain. 20 tablet 0  . pantoprazole (PROTONIX) 40 MG tablet Take 1 tablet (40 mg total) by mouth daily for 30 days. 30 tablet 0   No current facility-administered medications for this encounter.     Allergies  Allergen Reactions  . Metformin And Related Nausea And Vomiting  . Prednisone Nausea And Vomiting    Social History   Socioeconomic History  . Marital status: Divorced    Spouse name: Not on file  . Number of children: 2  . Years of  education: Not on file  . Highest education level: Not on file  Occupational History  . Not on file  Social Needs  . Financial resource strain: Not on file  . Food insecurity    Worry: Not on file    Inability: Not on file  . Transportation needs    Medical: Not on file    Non-medical: Not on file  Tobacco Use  . Smoking status: Never Smoker  . Smokeless tobacco: Never Used  Substance and Sexual Activity  . Alcohol use: No  . Drug use: No  . Sexual activity: Not on file  Lifestyle  . Physical activity    Days per week: Not on file    Minutes per session: Not on file  . Stress: Not on file  Relationships  . Social Herbalist on phone: Not on file    Gets together: Not on file    Attends religious service: Not on file    Active member of club or organization: Not on file    Attends meetings of clubs or organizations: Not on file    Relationship status: Not on file  . Intimate partner violence    Fear of current or ex partner: Not on file    Emotionally abused: Not on file    Physically abused: Not on file    Forced sexual activity: Not on file  Other Topics Concern  . Not on file  Social History Narrative  . Not on file    Family History  Problem Relation Age of Onset  . Heart attack Mother   . Heart attack Father     ROS- All systems are reviewed and negative except as per the HPI above  Physical Exam: Vitals:   09/08/19 1429  BP: 140/66  Pulse: 79  Weight: 69.5 kg  Height: 5\' 2"  (1.575 m)   Wt Readings from Last 3 Encounters:  09/08/19 69.5 kg  04/11/19 74.4 kg  03/20/19 74.4 kg    Labs: Lab Results  Component Value Date   NA 137 03/18/2019   K 3.9 03/18/2019   CL 96 (L) 03/18/2019   CO2 32 03/18/2019   GLUCOSE 190 (H) 03/18/2019   BUN 17 03/18/2019   CREATININE 0.84 03/18/2019   CALCIUM 8.8 (L) 03/18/2019   PHOS 3.5 03/13/2019   MG 1.7 03/17/2019   Lab Results  Component Value Date   INR 2.1 (H) 03/11/2019   Lab Results   Component Value Date   CHOL 91 03/11/2019   HDL 23 (L) 03/11/2019   LDLCALC 52 03/11/2019   TRIG 78 03/11/2019     GEN- The patient is well appearing, alert and oriented x 3 today.   Head- normocephalic, atraumatic Eyes-  Sclera clear, conjunctiva pink Ears- hearing intact Oropharynx- clear Neck- supple, no JVP Lymph- no cervical lymphadenopathy Lungs- Clear to ausculation bilaterally, normal work of breathing Heart- ir egular rate and rhythm, no murmurs, rubs or gallops, PMI not laterally displaced GI- soft, NT, ND, + BS Extremities- no clubbing, cyanosis, or edema MS- no significant deformity or atrophy Skin- no rash or lesion Psych- euthymic mood, full affect Neuro- strength and sensation are intact  EKG-afib at 79 bpm, qrs int 82 bpm, qtc 447 ms Epic records reviewed      Assessment and Plan: 1. afib  permanent and rate controlled BB was stopped 2/2 bradycardia In her last hospitalization, PPM was mentioned 2/2 brady, cardiology did not recommend   2. CHA2DS2VASc of at least 5 Anticoagulation  was stopped 2/2 frequent falls, pt states 4-5 in the last 5-6 months Last fall with head trauma requiring sutures  She states that she is undergoing physical therapy and hoping to get stronger She is living with her son  I will bring back in one month and if she is stronger and no further falls will consider restarting anticoagulation  Butch Penny C. Nil Xiong, [facility] [address] [place], [place] [address] [phone]

## 2019-09-09 DIAGNOSIS — Z433 Encounter for attention to colostomy: Secondary | ICD-10-CM | POA: Diagnosis not present

## 2019-09-09 DIAGNOSIS — M1990 Unspecified osteoarthritis, unspecified site: Secondary | ICD-10-CM | POA: Diagnosis not present

## 2019-09-09 DIAGNOSIS — C787 Secondary malignant neoplasm of liver and intrahepatic bile duct: Secondary | ICD-10-CM | POA: Diagnosis not present

## 2019-09-09 DIAGNOSIS — C189 Malignant neoplasm of colon, unspecified: Secondary | ICD-10-CM | POA: Diagnosis not present

## 2019-09-09 DIAGNOSIS — N183 Chronic kidney disease, stage 3 unspecified: Secondary | ICD-10-CM | POA: Diagnosis not present

## 2019-09-09 DIAGNOSIS — I130 Hypertensive heart and chronic kidney disease with heart failure and stage 1 through stage 4 chronic kidney disease, or unspecified chronic kidney disease: Secondary | ICD-10-CM | POA: Diagnosis not present

## 2019-09-09 DIAGNOSIS — I4891 Unspecified atrial fibrillation: Secondary | ICD-10-CM | POA: Diagnosis not present

## 2019-09-09 DIAGNOSIS — I5032 Chronic diastolic (congestive) heart failure: Secondary | ICD-10-CM | POA: Diagnosis not present

## 2019-09-09 DIAGNOSIS — E1122 Type 2 diabetes mellitus with diabetic chronic kidney disease: Secondary | ICD-10-CM | POA: Diagnosis not present

## 2019-09-10 DIAGNOSIS — N183 Chronic kidney disease, stage 3 unspecified: Secondary | ICD-10-CM | POA: Diagnosis not present

## 2019-09-10 DIAGNOSIS — E1122 Type 2 diabetes mellitus with diabetic chronic kidney disease: Secondary | ICD-10-CM | POA: Diagnosis not present

## 2019-09-10 DIAGNOSIS — I5032 Chronic diastolic (congestive) heart failure: Secondary | ICD-10-CM | POA: Diagnosis not present

## 2019-09-10 DIAGNOSIS — C787 Secondary malignant neoplasm of liver and intrahepatic bile duct: Secondary | ICD-10-CM | POA: Diagnosis not present

## 2019-09-10 DIAGNOSIS — C189 Malignant neoplasm of colon, unspecified: Secondary | ICD-10-CM | POA: Diagnosis not present

## 2019-09-10 DIAGNOSIS — I130 Hypertensive heart and chronic kidney disease with heart failure and stage 1 through stage 4 chronic kidney disease, or unspecified chronic kidney disease: Secondary | ICD-10-CM | POA: Diagnosis not present

## 2019-09-10 DIAGNOSIS — M1990 Unspecified osteoarthritis, unspecified site: Secondary | ICD-10-CM | POA: Diagnosis not present

## 2019-09-10 DIAGNOSIS — Z433 Encounter for attention to colostomy: Secondary | ICD-10-CM | POA: Diagnosis not present

## 2019-09-10 DIAGNOSIS — I4891 Unspecified atrial fibrillation: Secondary | ICD-10-CM | POA: Diagnosis not present

## 2019-09-11 ENCOUNTER — Ambulatory Visit: Payer: Self-pay | Admitting: Pharmacist

## 2019-09-11 DIAGNOSIS — R5381 Other malaise: Secondary | ICD-10-CM | POA: Diagnosis not present

## 2019-09-12 DIAGNOSIS — N183 Chronic kidney disease, stage 3 unspecified: Secondary | ICD-10-CM | POA: Diagnosis not present

## 2019-09-12 DIAGNOSIS — E1122 Type 2 diabetes mellitus with diabetic chronic kidney disease: Secondary | ICD-10-CM | POA: Diagnosis not present

## 2019-09-12 DIAGNOSIS — I130 Hypertensive heart and chronic kidney disease with heart failure and stage 1 through stage 4 chronic kidney disease, or unspecified chronic kidney disease: Secondary | ICD-10-CM | POA: Diagnosis not present

## 2019-09-12 DIAGNOSIS — Z433 Encounter for attention to colostomy: Secondary | ICD-10-CM | POA: Diagnosis not present

## 2019-09-12 DIAGNOSIS — M1990 Unspecified osteoarthritis, unspecified site: Secondary | ICD-10-CM | POA: Diagnosis not present

## 2019-09-12 DIAGNOSIS — C787 Secondary malignant neoplasm of liver and intrahepatic bile duct: Secondary | ICD-10-CM | POA: Diagnosis not present

## 2019-09-12 DIAGNOSIS — C189 Malignant neoplasm of colon, unspecified: Secondary | ICD-10-CM | POA: Diagnosis not present

## 2019-09-12 DIAGNOSIS — I5032 Chronic diastolic (congestive) heart failure: Secondary | ICD-10-CM | POA: Diagnosis not present

## 2019-09-12 DIAGNOSIS — I4891 Unspecified atrial fibrillation: Secondary | ICD-10-CM | POA: Diagnosis not present

## 2019-09-16 ENCOUNTER — Ambulatory Visit
Admission: RE | Admit: 2019-09-16 | Discharge: 2019-09-16 | Disposition: A | Discharge status: Home / Self Care | Payer: Medicare HMO | Source: Ambulatory Visit | Attending: Surgery | Admitting: Surgery

## 2019-09-16 DIAGNOSIS — Z433 Encounter for attention to colostomy: Secondary | ICD-10-CM | POA: Diagnosis not present

## 2019-09-16 DIAGNOSIS — I700 Atherosclerosis of aorta: Secondary | ICD-10-CM | POA: Diagnosis not present

## 2019-09-16 DIAGNOSIS — T8143XA Infection following a procedure, organ and space surgical site, initial encounter: Secondary | ICD-10-CM | POA: Diagnosis not present

## 2019-09-16 DIAGNOSIS — E1122 Type 2 diabetes mellitus with diabetic chronic kidney disease: Secondary | ICD-10-CM | POA: Diagnosis not present

## 2019-09-16 DIAGNOSIS — E278 Other specified disorders of adrenal gland: Secondary | ICD-10-CM | POA: Diagnosis not present

## 2019-09-16 DIAGNOSIS — C787 Secondary malignant neoplasm of liver and intrahepatic bile duct: Secondary | ICD-10-CM | POA: Diagnosis not present

## 2019-09-16 DIAGNOSIS — T8143XD Infection following a procedure, organ and space surgical site, subsequent encounter: Secondary | ICD-10-CM | POA: Diagnosis not present

## 2019-09-16 DIAGNOSIS — R6889 Other general symptoms and signs: Secondary | ICD-10-CM | POA: Diagnosis not present

## 2019-09-16 DIAGNOSIS — I130 Hypertensive heart and chronic kidney disease with heart failure and stage 1 through stage 4 chronic kidney disease, or unspecified chronic kidney disease: Secondary | ICD-10-CM | POA: Diagnosis not present

## 2019-09-16 DIAGNOSIS — N183 Chronic kidney disease, stage 3 unspecified: Secondary | ICD-10-CM | POA: Diagnosis not present

## 2019-09-16 DIAGNOSIS — C7889 Secondary malignant neoplasm of other digestive organs: Secondary | ICD-10-CM | POA: Diagnosis not present

## 2019-09-16 DIAGNOSIS — I5032 Chronic diastolic (congestive) heart failure: Secondary | ICD-10-CM | POA: Diagnosis not present

## 2019-09-16 DIAGNOSIS — K651 Peritoneal abscess: Secondary | ICD-10-CM | POA: Diagnosis not present

## 2019-09-16 DIAGNOSIS — K8020 Calculus of gallbladder without cholecystitis without obstruction: Secondary | ICD-10-CM | POA: Diagnosis not present

## 2019-09-16 DIAGNOSIS — C189 Malignant neoplasm of colon, unspecified: Secondary | ICD-10-CM | POA: Diagnosis not present

## 2019-09-16 DIAGNOSIS — K7689 Other specified diseases of liver: Secondary | ICD-10-CM | POA: Diagnosis not present

## 2019-09-16 DIAGNOSIS — R160 Hepatomegaly, not elsewhere classified: Secondary | ICD-10-CM | POA: Diagnosis not present

## 2019-09-16 DIAGNOSIS — N83202 Unspecified ovarian cyst, left side: Secondary | ICD-10-CM | POA: Diagnosis not present

## 2019-09-16 DIAGNOSIS — I4891 Unspecified atrial fibrillation: Secondary | ICD-10-CM | POA: Diagnosis not present

## 2019-09-16 DIAGNOSIS — D7389 Other diseases of spleen: Secondary | ICD-10-CM | POA: Diagnosis not present

## 2019-09-16 DIAGNOSIS — M1990 Unspecified osteoarthritis, unspecified site: Secondary | ICD-10-CM | POA: Diagnosis not present

## 2019-09-16 HISTORY — PX: IR RADIOLOGIST EVAL & MGMT: IMG5224

## 2019-09-16 MED ORDER — IOPAMIDOL (ISOVUE-300) INJECTION 61%: 100.0000 mL | Freq: Once | INTRAVENOUS | Status: DC | PRN

## 2019-09-16 MED ORDER — IOPAMIDOL (ISOVUE-300) INJECTION 61%
50.0000 mL | Freq: Once | INTRAVENOUS | Status: AC | PRN
  Administered 2019-09-16: 50 mL via INTRAVENOUS

## 2019-09-16 NOTE — Progress Notes (Signed)
Chief Complaint: Patient was seen in consultation today for abscess drain/fistula at the request of Martin,Matthew  Referring Physician(s): Martin,Matthew  History of Present Illness: Cassandra Allen is a 73 y.o. female with colon cancer, invasive moderately differentiated adenocarcinoma, status post colectomy. Postop course complicated by 2 abdominal abscesses status post percutaneous drains one in the epigastric region and a second in the left lower quadrant. LLQ drain has been removed. 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 Has not had follow up in a few months. Reports output is scant if any. Returns today for CT and drain injection.  Past Medical History:  Diagnosis Date  . Acute diastolic heart failure ([place]) 12/22/2018  . Arthritis   . Atrial fibrillation, chronic ([place]) 12/22/2018  . Cancer of left colon ([place]) 10/30/2018  . Cancer of sigmoid colon  12/27/2018  . Diabetes mellitus without complication ([place])   . Hypertension   . Obesity (BMI 30-39.9) 12/27/2018    Past Surgical History:  Procedure Laterality Date  . COLONOSCOPY  10/2018   Dr [name].  Large cancer at splenic flexure,  Bulky sigmoid colon mass, Numerous polyps  . Intra-abdominal abscess drainage  01/31/2019   Abscess drainage grew Enterococcus faecalis  . IR CATHETER TUBE CHANGE  06/05/2019  . IR IMAGING GUIDED PORT INSERTION  11/20/2018  . IR RADIOLOGIST EVAL & MGMT  06/04/2019  . IR RADIOLOGIST EVAL & MGMT  06/17/2019  . LAPAROTOMY N/A 12/27/2018   Procedure: LEFT SIGMOID COLECTOMY WITH HARTMANN POUCH AND END COLOSTOMY;  Surgeon: [name], MD;  Location: [facility];  Service: General;  Laterality: N/A;    Allergies: Metformin and related and Prednisone  Medications: Prior to Admission medications   Medication Sig Start Date End Date Taking? Authorizing Provider  acetaminophen (TYLENOL) 500 MG tablet Take 500 mg by mouth every 4 (four) hours as needed for mild pain or fever.     [provider]  amLODipine (NORVASC) 5 MG tablet Take 1 tablet by mouth daily. 08/21/19 09/20/19  [provider]  ferrous sulfate (KP FERROUS SULFATE) 325 (65 FE) MG tablet Take 1 tablet (325 mg total) by mouth daily with breakfast. 03/20/19   Hongalgi, Lenis Dickinson, MD  furosemide (LASIX) 40 MG tablet Take 1 tablet by mouth daily. 08/20/19 09/19/19  [provider]  Insulin Detemir (LEVEMIR) 100 UNIT/ML Pen Inject 16 Units into the skin daily. 03/21/19   Hongalgi, Lenis Dickinson, MD  liraglutide (VICTOZA) 18 MG/3ML SOPN Inject 0.3 mLs (1.8 mg total) into the skin every morning. 03/20/19   Hongalgi, Lenis Dickinson, MD  ondansetron (ZOFRAN) 8 MG tablet Take 1 tablet by mouth every 8 (eight) hours as needed for nausea.    [provider]  oxybutynin (DITROPAN-XL) 10 MG 24 hr tablet Take 1 tablet (10 mg total) by mouth daily. 03/20/19   Hongalgi, Lenis Dickinson, MD  pantoprazole (PROTONIX) 40 MG tablet Take 1 tablet (40 mg total) by mouth daily for 30 days. 03/20/19 04/19/19  Modena Jansky, MD  QUEtiapine (SEROQUEL) 25 MG tablet Take 0.5 tablets by mouth daily. 08/20/19 09/19/19  [provider]  sertraline (ZOLOFT) 100 MG tablet Take 1 tablet (100 mg total) by mouth daily. TAKE 1 TABLET BY MOUTH ONCE DAILY (TAKE WITH 25MG TO=125MG) FOR DEPRESSION Patient taking differently: Take 100 mg by mouth daily. Taking 122m daily 03/20/19   HModena Jansky MD  simvastatin (ZOCOR) 10 MG tablet Take 1 tablet (10 mg total) by mouth daily at 6  PM. Patient taking differently: Take 20 mg by mouth daily at 6 PM.  03/20/19   Hongalgi, Lenis Dickinson, MD  traMADol (ULTRAM) 50 MG tablet Take 1 tablet (50 mg total) by mouth every 6 (six) hours as needed for moderate pain or severe pain. 03/20/19 03/19/20  Modena Jansky, MD     Family History  Problem Relation Age of Onset  . Heart attack Mother   . Heart attack Father     Social History   Socioeconomic History  . Marital status: Divorced    Spouse name: Not on file  .  Number of children: 2  . Years of education: Not on file  . Highest education level: Not on file  Occupational History  . Not on file  Social Needs  . Financial resource strain: Not on file  . Food insecurity    Worry: Not on file    Inability: Not on file  . Transportation needs    Medical: Not on file    Non-medical: Not on file  Tobacco Use  . Smoking status: Never Smoker  . Smokeless tobacco: Never Used  Substance and Sexual Activity  . Alcohol use: No  . Drug use: No  . Sexual activity: Not on file  Lifestyle  . Physical activity    Days per week: Not on file    Minutes per session: Not on file  . Stress: Not on file  Relationships  . Social Herbalist on phone: Not on file    Gets together: Not on file    Attends religious service: Not on file    Active member of club or organization: Not on file    Attends meetings of clubs or organizations: Not on file    Relationship status: Not on file  Other Topics Concern  . Not on file  Social History Narrative  . Not on file      Review of Systems: A 12 point ROS discussed and pertinent positives are indicated in the HPI above.  All other systems are negative.  Review of Systems  Vital Signs: There were no vitals taken for this visit.  Physical Exam (L)ant drain intact site clean, dry. Scant serous output in bag. Drain injection performed. Met with resistance and immediate drainage of contrast at skin site. No apparent internal collection of contrast suggesting closure of fistula and abscess. See full report.  Imaging: Ct Abdomen Pelvis W Contrast  Result Date: 09/16/2019 CLINICAL DATA:  73 year old female with a history of colonic adenocarcinoma status post colectomy complicated by postoperative intra-abdominal abscesses requiring surgical and percutaneous drainage catheters. Currently, she has a single remaining percutaneous drainage catheter in the anterior peritoneal cavity which was last upsized  to 14 [place] on 06/05/2019. She presents today for drain evaluation. The drainage catheter output is 0 and has been for some time. Prior imaging has demonstrated a fistulous communication with the small bowel. EXAM: CT ABDOMEN AND PELVIS WITH CONTRAST TECHNIQUE: Multidetector CT imaging of the abdomen and pelvis was performed using the standard protocol following bolus administration of intravenous contrast. CONTRAST:  22m ISOVUE-300 IOPAMIDOL (ISOVUE-300) INJECTION 61% COMPARISON:  Most recent prior CT scan of the abdomen and pelvis 06/17/2019 FINDINGS: Lower chest: Stable cardiomegaly. No pericardial effusion. The visualized lung bases are clear. Unremarkable distal thoracic esophagus. Hepatobiliary: Ill-defined masslike hypodensity in the central aspect of the liver adjacent to the caudate lobe measures approximately 4.9 x 4.1 cm which is essentially unchanged compared to 5.0 x 4.0  cm on the prior study. Interval decrease in size of the cystic lesion within the caudate lobe of the liver. No additional discrete liver lesions are identified. Stones are present in the gallbladder lumen. No evidence of biliary ductal dilatation. Pancreas: Unremarkable. No pancreatic ductal dilatation or surrounding inflammatory changes. Spleen: Multiple small hypodense lesions scattered throughout the spleen, similar compared to prior. Adrenals/Urinary Tract: Stable to slightly enlarged left adrenal mass measures at 3.2 cm compared to 3.0 cm previously. The right adrenal gland remains unchanged. No evidence of hydronephrosis or nephrolithiasis. No enhancing renal mass. The ureters and bladder are unremarkable. Stomach/Bowel: Hartmann's pouch. Multiple small diverticula are present without evidence of active inflammation. Right lower quadrant diverting end colostomy. No evidence of obstruction. Vascular/Lymphatic: Atherosclerotic calcifications present along the abdominal aorta. No evidence of aneurysm or dissection. 1.1 cm short  axis right common iliac station lymph node may be slightly more prominent compared to 0.9 cm previously. Otherwise, no suspicious lymphadenopathy. Reproductive: The uterus is unremarkable for age. 1.8 cm low-attenuation cystic lesion in the left ovary demonstrates no significant interval change in size or appearance. Other: Decreasing ascites. Multiple loops of small bowel are closely adherent to the undersurface of the anterior abdominal wall suggesting adhesive disease. Anterior peritoneal drainage catheter remains in stable position. There is no evidence of undrained abscess cavity. Musculoskeletal: No acute fracture or aggressive appearing lytic or blastic osseous lesion. IMPRESSION: 1. Well-positioned drainage catheter with interval resolution of anterior peritoneal abscess. No undrained isolated collection identified. 2. Slight interval decrease in the volume of peritoneal ascites. 3. Stable central hepatic mass without significant interval change. Lesion remains concerning for metastatic disease. 4. Stable splenic metastases. 5. Stable left adrenal lesion previously characterized as an adenoma. 6. Additional ancillary findings as above without significant interval change. Electronically Signed   By: Jacqulynn Cadet M.D.   On: 09/16/2019 12:52    Labs:  CBC: Recent Labs    03/15/19 0500 03/16/19 0528 03/17/19 0430 03/18/19 0753  WBC 8.4 7.9 11.3* 9.0  HGB 8.7* 9.6* 9.9* 10.3*  HCT 31.3* 34.1* 34.3* 35.5*  PLT 303 303 295 288    COAGS: Recent Labs    12/27/18 0325 12/28/18 0323  01/31/19 1600 03/11/19 0238  03/12/19 1445 03/12/19 2048 03/13/19 0550 03/14/19 0350  INR 2.14 1.95  --  2.6* 2.1*  --   --   --   --   --   APTT 34  --    < >  --  35   < > 40* 60* 73* 72*   < > = values in this interval not displayed.    BMP: Recent Labs    03/15/19 0500 03/16/19 0528 03/17/19 0430 03/18/19 0753  NA 142 143 141 137  K 2.8* 2.9* 3.3* 3.9  CL 104 102 99 96*  CO2 29 34* 34*  32  GLUCOSE 241* 113* 159* 190*  BUN 10 11 17 17   CALCIUM 8.2* 8.4* 8.5* 8.8*  CREATININE 0.81 0.67 0.95 0.84  GFRNONAA >60 >60 60* >60  GFRAA >60 >60 >60 >60    LIVER FUNCTION TESTS: Recent Labs    12/31/18 0337  01/23/19 01/31/19 1301 03/10/19 2058 03/14/19 0350 03/16/19 0528  BILITOT 0.6  --   --  0.5 2.3* 1.0  --   AST 20   < > 12* 11* 237* 12*  --   ALT 20   < > 20 15 95* 42  --   ALKPHOS 84  --   --  234* 1,377* 528*  --   PROT 4.7*   < > 5.3*  5.3* 5.3* 6.0* 5.3*  --   ALBUMIN 1.5*   < > 2.4  2.4 1.6* 2.5* 2.1* 2.8*   < > = values in this interval not displayed.    TUMOR MARKERS: No results for input(s): AFPTM, CEA, CA199, CHROMGRNA in the last 8760 hours.  Assessment and Plan: Post op abd abscess with fistula. Follow up CT today looks great. Resolved abscess. Drain injection without evidence for fistula. Drain removed at bedside.  Thank you for this interesting consult.  I greatly enjoyed meeting Cassandra Allen and look forward to participating in their care.  A copy of this report was sent to the requesting provider on this date.  Electronically Signed: Ascencion Dike 09/16/2019, 1:27 PM   I spent a total of 20 minutes in face to face in clinical consultation, greater than 50% of which was counseling/coordinating care for abscess drain

## 2019-09-17 DIAGNOSIS — J9611 Chronic respiratory failure with hypoxia: Secondary | ICD-10-CM | POA: Diagnosis not present

## 2019-09-17 DIAGNOSIS — I5032 Chronic diastolic (congestive) heart failure: Secondary | ICD-10-CM | POA: Diagnosis not present

## 2019-09-17 DIAGNOSIS — I4821 Permanent atrial fibrillation: Secondary | ICD-10-CM | POA: Diagnosis not present

## 2019-09-17 DIAGNOSIS — F0390 Unspecified dementia without behavioral disturbance: Secondary | ICD-10-CM | POA: Diagnosis not present

## 2019-09-17 DIAGNOSIS — D631 Anemia in chronic kidney disease: Secondary | ICD-10-CM | POA: Diagnosis not present

## 2019-09-17 DIAGNOSIS — N183 Chronic kidney disease, stage 3 unspecified: Secondary | ICD-10-CM | POA: Diagnosis not present

## 2019-09-17 DIAGNOSIS — E1122 Type 2 diabetes mellitus with diabetic chronic kidney disease: Secondary | ICD-10-CM | POA: Diagnosis not present

## 2019-09-17 DIAGNOSIS — I130 Hypertensive heart and chronic kidney disease with heart failure and stage 1 through stage 4 chronic kidney disease, or unspecified chronic kidney disease: Secondary | ICD-10-CM | POA: Diagnosis not present

## 2019-09-17 DIAGNOSIS — I495 Sick sinus syndrome: Secondary | ICD-10-CM | POA: Diagnosis not present

## 2019-09-17 NOTE — Progress Notes (Signed)
[place]   Telephone:([phone]) [phone] Fax:(336) [phone]   Clinic Follow up Note   Patient Care Team: [name], MD as PCP - General (Family Medicine) Thompson Grayer, MD as PCP - Cardiology (Cardiology) Thompson Grayer, MD as PCP - Electrophysiology (Cardiology) Ronnette Juniper, MD as Consulting Physician (Gastroenterology) Truitt Merle, MD as Consulting Physician (Medical Oncology) Newt Minion, MD as Consulting Physician (Orthopedic Surgery) Leighton Ruff, MD as Consulting Physician (Colon and Rectal Surgery) [name], [facility] as [place] Management (Pharmacist) [name], [facility] as [place] Management (Pharmacist) Date of Service: 09/18/19   CHIEF COMPLAINT: F/u IDA and colon cancer  SUMMARY OF ONCOLOGIC HISTORY: Oncology History Overview Note  Cancer Staging Cancer of splenic flexure of colon Staging form: Colon and Rectum, AJCC 8th Edition - Clinical stage from 10/25/2018: Stage Unknown (cTX, cN0, cM1) - Signed by Truitt Merle, MD on 11/22/2018 - Pathologic stage from 12/27/2018: pT3, pN0, cM1 - Signed by Truitt Merle, MD on 05/02/2019    Cancer of splenic flexure of colon  10/25/2018 Procedure   10/25/2018 Colonoscopy Impression: -Two 4 to 6 mm sessile polyps were found in the sigmoid colon. Resected and retrieved. -A 6 mm sessile polyp was found in the descending colon. Resected and retrieved. -Three 5 to 15 mm sessile polyps were found in the transverse colon. Resection and retrieval were complete. -A 12 mm sessile polyp was found in the cecum. Resection and retrieval were complete.  -A 20 mm sessile polyp was found in the ascending colon. Resection and retrieval were complete. . -A malignancy partially obstructing large mass was found at 55 cm proximal to the anus. Biopsied and tattooed. -Malignant partially obstructing tumor 20 cm proximal to the anus. Biopsied and tattooed. -Diverticulosis in the sigmoid colon, in  the descending colon and in the transverse colon.    10/25/2018 Imaging   10/25/2018 Endoscopy Impression: -Normal esophagus -Z-line regular 35 cm from the incisors. -Non-bleeding erosive gastropathy -Erythematous mucosa in the antrum. Biopsied. -Normal examined duodenum. Biopsied.   10/25/2018 Cancer Staging   Staging form: Colon and Rectum, AJCC 8th Edition - Clinical stage from 10/25/2018: Stage Unknown (cTX, cN0, cM1) - Signed by Truitt Merle, MD on 11/22/2018   10/30/2018 Initial Diagnosis   Cancer of left colon ([place])   10/30/2018 Initial Biopsy   Final Diagnosis: 10/30/18 1. Small intestine-Duedenum, Biospy:   Benign 2.Stomach-Antrum, Biospy:   Chronic inactive Gastritis 3. Large intestine-Sigmoid Colon, Polyp:   Tubular adenomas (2) 4. Large interstine-Descending Colon, Polyp:   Tubular adenoma with high grade dysplasia, suspicious for invasion.  5. Large intestine-transverse colon, Polyp:   Tubulovilous Adenomas (3).  6. Large Intestine-Cecum, Polp:   Tubular adenoma  7. Large Intestine-Ascending Colon, Polyp:  Tubulovilous Adenoma 8. Large intestine, Biopsy, Mass 55cm:  Invasive moderately differentiated adenocarcinoma.  9. Large intestine, biopsy, Mass 20cm:   Tubulovilous Adenoma   11/01/2018 Imaging   CT CAP W contrast 11/01/18  IMPRESSION: CT CHEST: 1. Right paratracheal adenopathy (immediately superior to the azygos vein) which short axis dimension of 1.2 cm. In the present clinical setting it is possible this is related to metastatic involvement. 2. Scattered pulmonary parenchymal changes none of which are highly suspicious for metastatic disease. 3. Abnormal appearance of the thyroid gland with left lower lobe ill-defined mass spanning over 3 cm. Thyroid ultrasound can be performed for further delineation. 4. Cardiomegaly. Coronary artery calcification. 5.  Aortic Atherosclerosis (ICD10-I70.0).  CT ABDOMEN PELVIS: 1. Numerous hepatic lesions  suspicious for metastatic disease largest within the caudate lobe measuring up to 3.2 cm. 2. Gallbladder wall thickening. This may be related to increased right heart pressure or liver disease but could not exclude cholecystitis in the proper clinical setting. 3. Numerous splenic lesions which in the present clinical setting is suspicious for combination of metastatic disease and splenic cysts. 4. 3.2 cm left adrenal mass suspicious for metastatic disease. 5. Question sigmoid colon and possibly proximal descending colon mass. Rectosigmoid colon mass also not excluded. Correlation with colonoscopy results recommended. 6. Prominent number of colonic diverticula. Third spacing of fluid makes it difficult to evaluate for the possibility of diverticulitis. Radiopaque 1.5 cm structure within the right colon may be related to ingested foreign body. 7. Low-density fatty appearing structures in the external iliac region/pelvic sidewall bilaterally with adjacent low-density iliac lymph nodes and retroperitoneal lymph nodes raises possibility of low-density metastatic adenopathy. Prominent size portacaval lymph node which short axis dimension of 1.3 cm. PET-CT could be obtained for further delineation if clinically desired. 8. Gas within the urinary bladder may be related to recent manipulation. Clinical correlation recommended. 9. Left adnexal 2.4 cm cyst. This can be assessed with pelvic sonogram.   12/04/2018 - 12/18/2018 Chemotherapy   FOLFOX every 2 weeks starting 12/04/18. Stopped after cycle 2 on 12/18/18 due to SBO which resulted in left sigmoid colectomy. Unfortunately after surgery she developed post-op complications. She had an abcessed that required draininh in 02/2019.       12/07/2018 Imaging   MRI Abdomen 12/07/18  IMPRESSION: 1. The overall improvement with resolution of the number of the hepatic metastatic lesions. The dominant lesion in the caudate lobe is reduced in size from  previous 4.0 by 3.3 cm to current 3.8 by 2.7 cm. 2. The splenic lesions are similar to prior and nonspecific. 3. 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 There is a cystic component laterally which is probably incidental rather than from a collision lesion. 4. Hepatic hemochromatosis. 5. 7 mm gallstone in the common bile duct compatible with choledocholithiasis. There also multiple gallstones in the gallbladder. 6.  Aortic Atherosclerosis (ICD10-I70.0). 7. Bosniak category 2 cyst in the right kidney upper pole.   12/27/2018 Surgery   LEFT SIGMOID COLECTOMY WITH HARTMANN POUCH AND END COLOSTOMY by Dr Hassell Done 12/27/18    12/27/2018 Pathology Results   Diagnosis Colon, segmental resection for tumor, distal transverse, descending, sigmoid - INVASIVE MODERATELY DIFFERENTIATED ADENOCARCINOMA, 5.0 CM, CIRCUMFERENTIALLY INVOLVING THE PROXIMAL DESCENDING COLON WITH ASSOCIATED LUMINAL OBSTRUCTION. SEE NOTE. - CARCINOMA INVADES INTO THE PERICOLONIC SOFT TISSUE. - RESECTION MARGINS ARE NEGATIVE FOR CARCINOMA. - NEGATIVE FOR LYMPHOVASCULAR OR PERINEURAL INVASION. - TWENTY-TWO BENIGN LYMPH NODES, NEGATIVE FOR CARCINOMA (0/22). - SEPARATE LARGE VILLOUS ADENOMA INVOLVING SIGMOID COLON, 5.0 CM, WITHOUT HIGH GRADE DYSPLASIA OR CARCINOMA. - NON-SPECIFIC CHANGES IN THE PROXIMAL COLON, CONSISTENT WITH DISTAL OBSTRUCTION. - SEE ONCOLOGY TABLE.   12/27/2018 Cancer Staging   Staging form: Colon and Rectum, AJCC 8th Edition - Pathologic stage from 12/27/2018: pT3, pN0, cM1 - Signed by Truitt Merle, MD on 05/02/2019   06/17/2019 Imaging   IMPRESSION: 1. Decrease in overall size of the left anterior abscess cavity containing the percutaneous drain. There remains an abscess cavity measuring 5.7 cm in greatest diameter and containing air and fluid. 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 2. Multiple ill-defined low-density lesions  in the liver are again very concerning for metastatic disease. The 4  cm caudate lesion is the most suspicious for a metastatic lesion. Eventual correlation with MRI of the abdomen may be helpful to more accurately assess the extent of metastatic disease. 3. Gradual increase in the amount of free fluid in the peritoneal cavity with slight increase in perihepatic fluid since the prior study but clear gradual increase since prior scans in the last several months. Although there is no clear evidence of progressive carcinomatosis, the persistence and increased prominence of free fluid is concerning for potential malignant ascites. 4. Stable mildly prominent lymphadenopathy in the retroperitoneum, bilateral iliac chains and periportal/portacaval nodal stations.     09/16/2019 Imaging   IMPRESSION: 1. Well-positioned drainage catheter with interval resolution of anterior peritoneal abscess. No undrained isolated collection identified. 2. Slight interval decrease in the volume of peritoneal ascites. 3. Stable central hepatic mass without significant interval change. Lesion remains concerning for metastatic disease. 4. Stable splenic metastases. 5. Stable left adrenal lesion previously characterized as an adenoma. 6. Additional ancillary findings as above without significant interval change.     CURRENT THERAPY: Observation  INTERVAL HISTORY: Ms. Toms presents for follow-up as scheduled.  She had a phone visit with Dr. Burr Medico on 05/02/2019.  She has had to reschedule multiple appointments due to recurrent hospitalization for sepsis, Afib, and recurrent fall; most recently admitted to [facility] from 9/28-10/7. She was discharged to SNF and now lives with her son and daughter in law. She still has some dizzy spells, but less often. No recurrent fall. She is independent of ADLs, does not drive. Can do stairs with a railing. She is getting stronger. Does PT at home 1-2 times per week  and has [facility] home nurse that comes once weekly.   She feels OK in general. Appetite fluctuates. She has not weight due to her diet in the hospital. Denies n/v. Has colostomy, formed BM 1-2 times daily or more depending on her diet. Denies GI bleeding. Takes oral iron once daily. Denies new abdominal pain. She had a CT scan this week per surgeon Dr. Hassell Done, the abscess resolved and drain was removed. She will see Dr. Hassell Done at [facility] office later this month. She hopes colostomy will be removed. Otherwise, she denies fever, chills, cough, chest pain, dyspnea, leg swelling. Has knee aches.     MEDICAL HISTORY:  Past Medical History:  Diagnosis Date   Acute diastolic heart failure ([place]) 12/22/2018   Arthritis    Atrial fibrillation, chronic ([place]) 12/22/2018   Cancer of left colon ([place]) 10/30/2018   Cancer of sigmoid colon  12/27/2018   Diabetes mellitus without complication ([place])    Hypertension    Obesity (BMI 30-39.9) 12/27/2018    SURGICAL HISTORY: Past Surgical History:  Procedure Laterality Date   COLONOSCOPY  10/2018   Dr [name].  Large cancer at splenic flexure,  Bulky sigmoid colon mass, Numerous polyps   Intra-abdominal abscess drainage  01/31/2019   Abscess drainage grew Enterococcus faecalis   IR CATHETER TUBE CHANGE  06/05/2019   IR IMAGING GUIDED PORT INSERTION  11/20/2018   IR RADIOLOGIST EVAL & MGMT  06/04/2019   IR RADIOLOGIST EVAL & MGMT  06/17/2019   IR RADIOLOGIST EVAL & MGMT  09/16/2019   LAPAROTOMY N/A 12/27/2018   Procedure: LEFT SIGMOID COLECTOMY WITH HARTMANN POUCH AND END COLOSTOMY;  Surgeon: [name], MD;  Location: [facility] ORS;  Service: General;  Laterality: N/A;    I have reviewed the social history and family history with the patient and they are unchanged  from previous note.  ALLERGIES:  is allergic to metformin and related and prednisone.  MEDICATIONS:  Current Outpatient Medications  Medication Sig Dispense Refill   acetaminophen  (TYLENOL) 500 MG tablet Take 500 mg by mouth every 4 (four) hours as needed for mild pain or fever.      amLODipine (NORVASC) 5 MG tablet Take 1 tablet by mouth daily.     ferrous sulfate (KP FERROUS SULFATE) 325 (65 FE) MG tablet Take 1 tablet (325 mg total) by mouth daily with breakfast. 30 tablet 0   furosemide (LASIX) 40 MG tablet Take 1 tablet by mouth daily.     Insulin Detemir (LEVEMIR) 100 UNIT/ML Pen Inject 16 Units into the skin daily. 15 mL 0   liraglutide (VICTOZA) 18 MG/3ML SOPN Inject 0.3 mLs (1.8 mg total) into the skin every morning. 5 pen 0   ondansetron (ZOFRAN) 8 MG tablet Take 1 tablet by mouth every 8 (eight) hours as needed for nausea.     oxybutynin (DITROPAN-XL) 10 MG 24 hr tablet Take 1 tablet (10 mg total) by mouth daily. 30 tablet 0   QUEtiapine (SEROQUEL) 25 MG tablet Take 0.5 tablets by mouth daily.     sertraline (ZOLOFT) 100 MG tablet Take 1 tablet (100 mg total) by mouth daily. TAKE 1 TABLET BY MOUTH ONCE DAILY (TAKE WITH 25MG  TO=125MG ) FOR DEPRESSION (Patient taking differently: Take 100 mg by mouth daily. Taking 125mg  daily) 30 tablet 0   simvastatin (ZOCOR) 10 MG tablet Take 1 tablet (10 mg total) by mouth daily at 6 PM. (Patient taking differently: Take 20 mg by mouth daily at 6 PM. ) 30 tablet 0   TOVIAZ 8 MG TB24 tablet Take 8 mg by mouth daily.     traMADol (ULTRAM) 50 MG tablet Take 1 tablet (50 mg total) by mouth every 6 (six) hours as needed for moderate pain or severe pain. 20 tablet 0   pantoprazole (PROTONIX) 40 MG tablet Take 1 tablet (40 mg total) by mouth daily for 30 days. 30 tablet 0   No current facility-administered medications for this visit.     PHYSICAL EXAMINATION: ECOG PERFORMANCE STATUS: 2 - Symptomatic, <50% confined to bed  Vitals:   09/18/19 1312  BP: (!) 147/62  Pulse: 99  Resp: 20  Temp: 98.2 F (36.8 C)  SpO2: 96%   Filed Weights   09/18/19 1312  Weight: 152 lb 11.2 oz (69.3 kg)    GENERAL:alert, no  distress and comfortable SKIN: no rash  EYES:  sclera clear LYMPH:  no palpable cervical or supraclavicular lymphadenopathy LUNGS: clear with normal breathing effort HEART: Afib; no lower extremity edema ABDOMEN:abdomen soft, non-tender and normal bowel sounds. Right abdomen colostomy with formed stool in collection bag  NEURO: alert & oriented x 3 with fluent speech, mild generalized weakness.  PAC without erythema   LABORATORY DATA:  I have reviewed the data as listed CBC Latest Ref Rng & Units 09/18/2019 03/18/2019 03/17/2019  WBC 4.0 - 10.5 K/uL 6.0 9.0 11.3(H)  Hemoglobin 12.0 - 15.0 g/dL 10.1(L) 10.3(L) 9.9(L)  Hematocrit 36.0 - 46.0 % 32.6(L) 35.5(L) 34.3(L)  Platelets 150 - 400 K/uL 185 288 295     CMP Latest Ref Rng & Units 09/18/2019 03/18/2019 03/17/2019  Glucose 70 - 99 mg/dL 96 190(H) 159(H)  BUN 8 - 23 mg/dL 47(H) 17 17  Creatinine 0.44 - 1.00 mg/dL 1.69(H) 0.84 0.95  Sodium 135 - 145 mmol/L 142 137 141  Potassium 3.5 - 5.1 mmol/L 4.1  3.9 3.3(L)  Chloride 98 - 111 mmol/L 104 96(L) 99  CO2 22 - 32 mmol/L 25 32 34(H)  Calcium 8.9 - 10.3 mg/dL 8.8(L) 8.8(L) 8.5(L)  Total Protein 6.5 - 8.1 g/dL 6.9 - -  Total Bilirubin 0.3 - 1.2 mg/dL 0.3 - -  Alkaline Phos 38 - 126 U/L 65 - -  AST 15 - 41 U/L 9(L) - -  ALT 0 - 44 U/L 6 - -      RADIOGRAPHIC STUDIES: I have personally reviewed the radiological images as listed and agreed with the findings in the report. No results found.   ASSESSMENT & PLAN: Cassandra Allen is a 73 y.o. female with   1.Left colon Cancer,G1,pT3N0cMx with probable liver metastasis  -She was diagnosed withInvasive moderately differentiated adenocarcinomaof left colon in 10/2018.She presented with iron deficient anemia -CT CAP from 11/01/18 showed several hepatic lesions suspicious for metastasis. Her subsequent liver biopsy from 11/20/18 was negativefor malignancy.Per Dr. Jacelyn Grip didthebiopsy,the lesion was not very clear on the  ultrasound, and was in a difficult spot for biopsy. -She started FOLFOX on 12/04/18.Chemo was stopped after cycle 2 due tobowel obstruction from her primary colon tumorwhich required urgentleft sigmoid colectomy. Unfortunately after surgery she developed post-op complications. She had anabscessthat required draining in 02/2019.  -Her tumor was removed completely, she had negative margins and node negative.Liver biopsy was not feasible during her colon surgery -Pt has had complicated course, frequent hospitalization for recurrent fall, abscess, Afib since last visit.  -Ms. Madill appears to be recovering slowly, still weak. She lives with her son, PT comes 1-2 times per week and home nurse from [facility] comes once weekly. I encouraged her to continue this, try to get stronger, and continue ALDs independently -her peritoneal abscess has been followed by surgeon Dr. Hassell Done and IR. CT on 11/3 showed resolution of the abscess and the drain was removed. Scan also shows the central hepatic mass is stable, no new evidence concerning for metastasis. There is mention of splenic metastasis which has not been biopsied or confirmed.  -CBC and CMP reviewed, her anemia is stable, continues oral iron. Cr is elevated to 1.69, was normal 5 months ago. Possibly related to recent CT contrast, diuretic. She has mild orthostatic hypotension today and is likely also dehydrated.  -I recommend to hold lasix, use only as needed for significant swelling. Will give her 500 cc NS today.  -We will continue observation and supportive care.   -she will see Dr. Hassell Done on 09/24/19, will cc my note.  -Plan to repeat lab with PAC flush and f/u in 4 weeks -Plan to do PET scan in 3-6 months depending on Dr. Earlie Server plan to re-image, or sooner if she develops new concerns which we reviewed today   2. Severe anemia, iron deficientanemia and anemia of chronic disease -she was on IV iron sucrose 200mg as needed. Previously had a  severe reaction to Feraheme. BM biopsy was negative for MDS or malignancy. -Her 11/2018 MRI shows iron deposits in her liver, will give IV iron more sparingly to prevent iron overload. -resolved now since had hemicolectomy  -On oral iron once daily  3. HTN, AF -Continue medications and f/u with PCP. BP well controlled -She ison Eliquis -she is on lasix, Cr 1.69 with mild orthostatic hypotension. I recommend to hold lasix for now, can take 1 tab PRN for significant LE edema  4. DM, obesity -Currently on insulin and Lirraglutide. -f/u with PCP and monitor BG at home -stable  5.  Social Support  -She does not drive currently, her son took car away after frequent hospitalizations over the last several months.  -she reports when she can confidently go up and down stairs by herself, her son will give the car back -her son helps with some transportation, now she gets rides from [place]  -she is not working; lives with son and daughter in law  54.Goal of care discussion  -she is full code now   PLAN: -Labs, CT reviewed -500 cc NS over 1 hour for mild orthostatic hypotension and Cr. 1.69 -Hold lasix, take 1 tab only as needed for significant LE edema  -Lab, flush, f/u in 4 weeks  -F/u with Dr. Hassell Done as planned 09/24/19, will cc my note  -PET scan in 3-6 months, depending on imaging plan per Dr. Hassell Done -Continue PT, home health nurse  - I reviewed the plan with Dr. Burr Medico   All questions were answered. The patient knows to call the clinic with any problems, questions or concerns. No barriers to learning was detected. I spent 20 minutes counseling the patient face to face. The total time spent in the appointment was 25 minutes and more than 50% was on counseling and review of test results     Alla Feeling, NP 09/19/19

## 2019-09-18 ENCOUNTER — Other Ambulatory Visit: Payer: Self-pay

## 2019-09-18 ENCOUNTER — Inpatient Hospital Stay: Payer: Medicare HMO | Attending: Nurse Practitioner

## 2019-09-18 ENCOUNTER — Inpatient Hospital Stay (HOSPITAL_BASED_OUTPATIENT_CLINIC_OR_DEPARTMENT_OTHER): Payer: Medicare HMO | Admitting: Nurse Practitioner

## 2019-09-18 VITALS — BP 147/62 | HR 99 | Temp 98.2°F | Resp 20 | Ht 62.0 in | Wt 152.7 lb

## 2019-09-18 DIAGNOSIS — E119 Type 2 diabetes mellitus without complications: Secondary | ICD-10-CM | POA: Insufficient documentation

## 2019-09-18 DIAGNOSIS — R296 Repeated falls: Secondary | ICD-10-CM | POA: Insufficient documentation

## 2019-09-18 DIAGNOSIS — Z794 Long term (current) use of insulin: Secondary | ICD-10-CM | POA: Insufficient documentation

## 2019-09-18 DIAGNOSIS — I2510 Atherosclerotic heart disease of native coronary artery without angina pectoris: Secondary | ICD-10-CM | POA: Diagnosis not present

## 2019-09-18 DIAGNOSIS — D122 Benign neoplasm of ascending colon: Secondary | ICD-10-CM | POA: Diagnosis not present

## 2019-09-18 DIAGNOSIS — R160 Hepatomegaly, not elsewhere classified: Secondary | ICD-10-CM | POA: Insufficient documentation

## 2019-09-18 DIAGNOSIS — Z888 Allergy status to other drugs, medicaments and biological substances status: Secondary | ICD-10-CM | POA: Insufficient documentation

## 2019-09-18 DIAGNOSIS — D120 Benign neoplasm of cecum: Secondary | ICD-10-CM | POA: Insufficient documentation

## 2019-09-18 DIAGNOSIS — Z9049 Acquired absence of other specified parts of digestive tract: Secondary | ICD-10-CM | POA: Insufficient documentation

## 2019-09-18 DIAGNOSIS — C186 Malignant neoplasm of descending colon: Secondary | ICD-10-CM | POA: Diagnosis not present

## 2019-09-18 DIAGNOSIS — I482 Chronic atrial fibrillation, unspecified: Secondary | ICD-10-CM | POA: Diagnosis not present

## 2019-09-18 DIAGNOSIS — I110 Hypertensive heart disease with heart failure: Secondary | ICD-10-CM | POA: Diagnosis not present

## 2019-09-18 DIAGNOSIS — R6889 Other general symptoms and signs: Secondary | ICD-10-CM | POA: Diagnosis not present

## 2019-09-18 DIAGNOSIS — N281 Cyst of kidney, acquired: Secondary | ICD-10-CM | POA: Diagnosis not present

## 2019-09-18 DIAGNOSIS — M1990 Unspecified osteoarthritis, unspecified site: Secondary | ICD-10-CM | POA: Diagnosis not present

## 2019-09-18 DIAGNOSIS — Z79899 Other long term (current) drug therapy: Secondary | ICD-10-CM | POA: Diagnosis not present

## 2019-09-18 DIAGNOSIS — I951 Orthostatic hypotension: Secondary | ICD-10-CM | POA: Insufficient documentation

## 2019-09-18 DIAGNOSIS — C187 Malignant neoplasm of sigmoid colon: Secondary | ICD-10-CM | POA: Insufficient documentation

## 2019-09-18 DIAGNOSIS — E279 Disorder of adrenal gland, unspecified: Secondary | ICD-10-CM | POA: Insufficient documentation

## 2019-09-18 DIAGNOSIS — I700 Atherosclerosis of aorta: Secondary | ICD-10-CM | POA: Insufficient documentation

## 2019-09-18 DIAGNOSIS — D509 Iron deficiency anemia, unspecified: Secondary | ICD-10-CM

## 2019-09-18 DIAGNOSIS — D638 Anemia in other chronic diseases classified elsewhere: Secondary | ICD-10-CM | POA: Diagnosis not present

## 2019-09-18 DIAGNOSIS — D124 Benign neoplasm of descending colon: Secondary | ICD-10-CM | POA: Diagnosis not present

## 2019-09-18 DIAGNOSIS — R42 Dizziness and giddiness: Secondary | ICD-10-CM | POA: Insufficient documentation

## 2019-09-18 DIAGNOSIS — Z86010 Personal history of colonic polyps: Secondary | ICD-10-CM | POA: Insufficient documentation

## 2019-09-18 DIAGNOSIS — C7889 Secondary malignant neoplasm of other digestive organs: Secondary | ICD-10-CM | POA: Diagnosis not present

## 2019-09-18 DIAGNOSIS — Z95828 Presence of other vascular implants and grafts: Secondary | ICD-10-CM

## 2019-09-18 DIAGNOSIS — E669 Obesity, unspecified: Secondary | ICD-10-CM | POA: Insufficient documentation

## 2019-09-18 DIAGNOSIS — I5032 Chronic diastolic (congestive) heart failure: Secondary | ICD-10-CM | POA: Diagnosis not present

## 2019-09-18 LAB — CMP (CANCER CENTER ONLY)
ALT: 6 U/L (ref 0–44)
AST: 9 U/L — ABNORMAL LOW (ref 15–41)
Albumin: 3.7 g/dL (ref 3.5–5.0)
Alkaline Phosphatase: 65 U/L (ref 38–126)
Anion gap: 13 (ref 5–15)
BUN: 47 mg/dL — ABNORMAL HIGH (ref 8–23)
CO2: 25 mmol/L (ref 22–32)
Calcium: 8.8 mg/dL — ABNORMAL LOW (ref 8.9–10.3)
Chloride: 104 mmol/L (ref 98–111)
Creatinine: 1.69 mg/dL — ABNORMAL HIGH (ref 0.44–1.00)
GFR, Est AFR Am: 35 mL/min — ABNORMAL LOW (ref >60)
GFR, Estimated: 30 mL/min — ABNORMAL LOW (ref >60)
Glucose, Bld: 96 mg/dL (ref 70–99)
Potassium: 4.1 mmol/L (ref 3.5–5.1)
Sodium: 142 mmol/L (ref 135–145)
Total Bilirubin: 0.3 mg/dL (ref 0.3–1.2)
Total Protein: 6.9 g/dL (ref 6.5–8.1)

## 2019-09-18 LAB — CBC WITH DIFFERENTIAL (CANCER CENTER ONLY)
Abs Immature Granulocytes: 0.02 10*3/uL (ref 0.00–0.07)
Basophils Absolute: 0.1 10*3/uL (ref 0.0–0.1)
Basophils Relative: 1 %
Eosinophils Absolute: 0.1 10*3/uL (ref 0.0–0.5)
Eosinophils Relative: 2 %
HCT: 32.6 % — ABNORMAL LOW (ref 36.0–46.0)
Hemoglobin: 10.1 g/dL — ABNORMAL LOW (ref 12.0–15.0)
Immature Granulocytes: 0 %
Lymphocytes Relative: 12 %
Lymphs Abs: 0.7 10*3/uL (ref 0.7–4.0)
MCH: 27.9 pg (ref 26.0–34.0)
MCHC: 31 g/dL (ref 30.0–36.0)
MCV: 90.1 fL (ref 80.0–100.0)
Monocytes Absolute: 0.8 10*3/uL (ref 0.1–1.0)
Monocytes Relative: 13 %
Neutro Abs: 4.3 10*3/uL (ref 1.7–7.7)
Neutrophils Relative %: 72 %
Platelet Count: 185 10*3/uL (ref 150–400)
RBC: 3.62 MIL/uL — ABNORMAL LOW (ref 3.87–5.11)
RDW: 16 % — ABNORMAL HIGH (ref 11.5–15.5)
WBC Count: 6 10*3/uL (ref 4.0–10.5)
nRBC: 0 % (ref 0.0–0.2)

## 2019-09-18 MED ORDER — SODIUM CHLORIDE 0.9% FLUSH
10.0000 mL | Freq: Once | INTRAVENOUS | Status: AC
  Administered 2019-09-18: 10 mL
  Filled 2019-09-18: qty 10

## 2019-09-18 MED ORDER — HEPARIN SOD (PORK) LOCK FLUSH 100 UNIT/ML IV SOLN
500.0000 [IU] | Freq: Once | INTRAVENOUS | Status: AC
  Administered 2019-09-18: 500 [IU]
  Filled 2019-09-18: qty 5

## 2019-09-18 MED ORDER — SODIUM CHLORIDE 0.9 % IV SOLN
Freq: Once | INTRAVENOUS | Status: AC
  Administered 2019-09-18: 14:00:00 via INTRAVENOUS
  Filled 2019-09-18: qty 250

## 2019-09-18 NOTE — Patient Instructions (Signed)
Dehydration, Adult  Dehydration is when there is not enough fluid or water in your body. This happens when you lose more fluids than you take in. Dehydration can range from mild to very bad. It should be treated right away to keep it from getting very bad. Symptoms of mild dehydration may include:  Thirst.  Dry lips.  Slightly dry mouth.  Dry, warm skin.  Dizziness. Symptoms of moderate dehydration may include:  Very dry mouth.  Muscle cramps.  Dark pee (urine). Pee may be the color of tea.  Your body making less pee.  Your eyes making fewer tears.  Heartbeat that is uneven or faster than normal (palpitations).  Headache.  Light-headedness, especially when you stand up from sitting.  Fainting (syncope). Symptoms of very bad dehydration may include:  Changes in skin, such as: ? Cold and clammy skin. ? Blotchy (mottled) or pale skin. ? Skin that does not quickly return to normal after being lightly pinched and let go (poor skin turgor).  Changes in body fluids, such as: ? Feeling very thirsty. ? Your eyes making fewer tears. ? Not sweating when body temperature is high, such as in hot weather. ? Your body making very little pee.  Changes in vital signs, such as: ? Weak pulse. ? Pulse that is more than 100 beats a minute when you are sitting still. ? Fast breathing. ? Low blood pressure.  Other changes, such as: ? Sunken eyes. ? Cold hands and feet. ? Confusion. ? Lack of energy (lethargy). ? Trouble waking up from sleep. ? Short-term weight loss. ? Unconsciousness. Follow these instructions at home:   If told by your doctor, drink an ORS: ? Make an ORS by using instructions on the package. ? Start by drinking small amounts, about  cup (120 mL) every 5-10 minutes. ? Slowly drink more until you have had the amount that your doctor said to have.  Drink enough clear fluid to keep your pee clear or pale yellow. If you were told to drink an ORS, finish the  ORS first, then start slowly drinking clear fluids. Drink fluids such as: ? Water. Do not drink only water by itself. Doing that can make the salt (sodium) level in your body get too low (hyponatremia). ? Ice chips. ? Fruit juice that you have added water to (diluted). ? Low-calorie sports drinks.  Avoid: ? Alcohol. ? Drinks that have a lot of sugar. These include high-calorie sports drinks, fruit juice that does not have water added, and soda. ? Caffeine. ? Foods that are greasy or have a lot of fat or sugar.  Take over-the-counter and prescription medicines only as told by your doctor.  Do not take salt tablets. Doing that can make the salt level in your body get too high (hypernatremia).  Eat foods that have minerals (electrolytes). Examples include bananas, oranges, potatoes, tomatoes, and spinach.  Keep all follow-up visits as told by your doctor. This is important. Contact a doctor if:  You have belly (abdominal) pain that: ? Gets worse. ? Stays in one area (localizes).  You have a rash.  You have a stiff neck.  You get angry or annoyed more easily than normal (irritability).  You are more sleepy than normal.  You have a harder time waking up than normal.  You feel: ? Weak. ? Dizzy. ? Very thirsty.  You have peed (urinated) only a small amount of very dark pee during 6-8 hours. Get help right away if:  You have   symptoms of very bad dehydration.  You cannot drink fluids without throwing up (vomiting).  Your symptoms get worse with treatment.  You have a fever.  You have a very bad headache.  You are throwing up or having watery poop (diarrhea) and it: ? Gets worse. ? Does not go away.  You have blood or something green (bile) in your throw-up.  You have blood in your poop (stool). This may cause poop to look black and tarry.  You have not peed in 6-8 hours.  You pass out (faint).  Your heart rate when you are sitting still is more than 100 beats a  minute.  You have trouble breathing. This information is not intended to replace advice given to you by your health care provider. Make sure you discuss any questions you have with your health care provider. Document Released: 08/26/2009 Document Revised: 10/12/2017 Document Reviewed: 12/24/2015 Elsevier Patient Education  2020 [employer]  

## 2019-09-19 ENCOUNTER — Encounter: Payer: Self-pay | Admitting: Nurse Practitioner

## 2019-09-19 ENCOUNTER — Telehealth: Payer: Self-pay | Admitting: Nurse Practitioner

## 2019-09-19 NOTE — Telephone Encounter (Signed)
No los per 11/5. °

## 2019-09-22 ENCOUNTER — Telehealth: Payer: Self-pay | Admitting: Hematology

## 2019-09-22 DIAGNOSIS — F0390 Unspecified dementia without behavioral disturbance: Secondary | ICD-10-CM | POA: Diagnosis not present

## 2019-09-22 DIAGNOSIS — E1122 Type 2 diabetes mellitus with diabetic chronic kidney disease: Secondary | ICD-10-CM | POA: Diagnosis not present

## 2019-09-22 DIAGNOSIS — I5032 Chronic diastolic (congestive) heart failure: Secondary | ICD-10-CM | POA: Diagnosis not present

## 2019-09-22 DIAGNOSIS — I130 Hypertensive heart and chronic kidney disease with heart failure and stage 1 through stage 4 chronic kidney disease, or unspecified chronic kidney disease: Secondary | ICD-10-CM | POA: Diagnosis not present

## 2019-09-22 DIAGNOSIS — J9611 Chronic respiratory failure with hypoxia: Secondary | ICD-10-CM | POA: Diagnosis not present

## 2019-09-22 DIAGNOSIS — D631 Anemia in chronic kidney disease: Secondary | ICD-10-CM | POA: Diagnosis not present

## 2019-09-22 DIAGNOSIS — I495 Sick sinus syndrome: Secondary | ICD-10-CM | POA: Diagnosis not present

## 2019-09-22 DIAGNOSIS — N183 Chronic kidney disease, stage 3 unspecified: Secondary | ICD-10-CM | POA: Diagnosis not present

## 2019-09-22 DIAGNOSIS — I4821 Permanent atrial fibrillation: Secondary | ICD-10-CM | POA: Diagnosis not present

## 2019-09-22 NOTE — Telephone Encounter (Signed)
Scheduled apt per 11/6 sch message - pt is aware of appt date an time

## 2019-09-24 DIAGNOSIS — C186 Malignant neoplasm of descending colon: Secondary | ICD-10-CM | POA: Diagnosis not present

## 2019-09-24 DIAGNOSIS — R6889 Other general symptoms and signs: Secondary | ICD-10-CM | POA: Diagnosis not present

## 2019-09-25 DIAGNOSIS — I5032 Chronic diastolic (congestive) heart failure: Secondary | ICD-10-CM | POA: Diagnosis not present

## 2019-09-25 DIAGNOSIS — I130 Hypertensive heart and chronic kidney disease with heart failure and stage 1 through stage 4 chronic kidney disease, or unspecified chronic kidney disease: Secondary | ICD-10-CM | POA: Diagnosis not present

## 2019-09-25 DIAGNOSIS — I495 Sick sinus syndrome: Secondary | ICD-10-CM | POA: Diagnosis not present

## 2019-09-25 DIAGNOSIS — E1122 Type 2 diabetes mellitus with diabetic chronic kidney disease: Secondary | ICD-10-CM | POA: Diagnosis not present

## 2019-09-25 DIAGNOSIS — I4821 Permanent atrial fibrillation: Secondary | ICD-10-CM | POA: Diagnosis not present

## 2019-09-25 DIAGNOSIS — D631 Anemia in chronic kidney disease: Secondary | ICD-10-CM | POA: Diagnosis not present

## 2019-09-25 DIAGNOSIS — N183 Chronic kidney disease, stage 3 unspecified: Secondary | ICD-10-CM | POA: Diagnosis not present

## 2019-09-25 DIAGNOSIS — F0390 Unspecified dementia without behavioral disturbance: Secondary | ICD-10-CM | POA: Diagnosis not present

## 2019-09-25 DIAGNOSIS — J9611 Chronic respiratory failure with hypoxia: Secondary | ICD-10-CM | POA: Diagnosis not present

## 2019-09-29 ENCOUNTER — Ambulatory Visit: Payer: Self-pay | Admitting: Pharmacist

## 2019-09-29 ENCOUNTER — Other Ambulatory Visit: Payer: Self-pay | Admitting: Pharmacist

## 2019-09-29 DIAGNOSIS — E1122 Type 2 diabetes mellitus with diabetic chronic kidney disease: Secondary | ICD-10-CM | POA: Diagnosis not present

## 2019-09-29 DIAGNOSIS — D631 Anemia in chronic kidney disease: Secondary | ICD-10-CM | POA: Diagnosis not present

## 2019-09-29 DIAGNOSIS — I495 Sick sinus syndrome: Secondary | ICD-10-CM | POA: Diagnosis not present

## 2019-09-29 DIAGNOSIS — J9611 Chronic respiratory failure with hypoxia: Secondary | ICD-10-CM | POA: Diagnosis not present

## 2019-09-29 DIAGNOSIS — N183 Chronic kidney disease, stage 3 unspecified: Secondary | ICD-10-CM | POA: Diagnosis not present

## 2019-09-29 DIAGNOSIS — I130 Hypertensive heart and chronic kidney disease with heart failure and stage 1 through stage 4 chronic kidney disease, or unspecified chronic kidney disease: Secondary | ICD-10-CM | POA: Diagnosis not present

## 2019-09-29 DIAGNOSIS — I4821 Permanent atrial fibrillation: Secondary | ICD-10-CM | POA: Diagnosis not present

## 2019-09-29 DIAGNOSIS — I5032 Chronic diastolic (congestive) heart failure: Secondary | ICD-10-CM | POA: Diagnosis not present

## 2019-09-29 DIAGNOSIS — F0390 Unspecified dementia without behavioral disturbance: Secondary | ICD-10-CM | POA: Diagnosis not present

## 2019-09-29 NOTE — Patient Outreach (Signed)
[facility]) Care Management  09/29/2019  Cassandra [facility] 02-06-1946 [id number]   Patient was called regarding medication assistance and review. Unfortuantely, she did not answer her phone. HIPAA compliant message was left on her voicemail.  Plan: Send patient an unsuccessful outreach letter. Call patient back in the new year to inquire about 2021 patient assistance.  [name], PharmD, [place] Clinical Pharmacist [phone]

## 2019-10-02 DIAGNOSIS — I4821 Permanent atrial fibrillation: Secondary | ICD-10-CM | POA: Diagnosis not present

## 2019-10-02 DIAGNOSIS — N183 Chronic kidney disease, stage 3 unspecified: Secondary | ICD-10-CM | POA: Diagnosis not present

## 2019-10-02 DIAGNOSIS — I130 Hypertensive heart and chronic kidney disease with heart failure and stage 1 through stage 4 chronic kidney disease, or unspecified chronic kidney disease: Secondary | ICD-10-CM | POA: Diagnosis not present

## 2019-10-02 DIAGNOSIS — I495 Sick sinus syndrome: Secondary | ICD-10-CM | POA: Diagnosis not present

## 2019-10-02 DIAGNOSIS — D631 Anemia in chronic kidney disease: Secondary | ICD-10-CM | POA: Diagnosis not present

## 2019-10-02 DIAGNOSIS — I5032 Chronic diastolic (congestive) heart failure: Secondary | ICD-10-CM | POA: Diagnosis not present

## 2019-10-02 DIAGNOSIS — F0390 Unspecified dementia without behavioral disturbance: Secondary | ICD-10-CM | POA: Diagnosis not present

## 2019-10-02 DIAGNOSIS — J9611 Chronic respiratory failure with hypoxia: Secondary | ICD-10-CM | POA: Diagnosis not present

## 2019-10-02 DIAGNOSIS — E1122 Type 2 diabetes mellitus with diabetic chronic kidney disease: Secondary | ICD-10-CM | POA: Diagnosis not present

## 2019-10-06 DIAGNOSIS — N183 Chronic kidney disease, stage 3 unspecified: Secondary | ICD-10-CM | POA: Diagnosis not present

## 2019-10-06 DIAGNOSIS — I5032 Chronic diastolic (congestive) heart failure: Secondary | ICD-10-CM | POA: Diagnosis not present

## 2019-10-06 DIAGNOSIS — I130 Hypertensive heart and chronic kidney disease with heart failure and stage 1 through stage 4 chronic kidney disease, or unspecified chronic kidney disease: Secondary | ICD-10-CM | POA: Diagnosis not present

## 2019-10-06 DIAGNOSIS — D631 Anemia in chronic kidney disease: Secondary | ICD-10-CM | POA: Diagnosis not present

## 2019-10-06 DIAGNOSIS — F0390 Unspecified dementia without behavioral disturbance: Secondary | ICD-10-CM | POA: Diagnosis not present

## 2019-10-06 DIAGNOSIS — E1122 Type 2 diabetes mellitus with diabetic chronic kidney disease: Secondary | ICD-10-CM | POA: Diagnosis not present

## 2019-10-06 DIAGNOSIS — I495 Sick sinus syndrome: Secondary | ICD-10-CM | POA: Diagnosis not present

## 2019-10-06 DIAGNOSIS — J9611 Chronic respiratory failure with hypoxia: Secondary | ICD-10-CM | POA: Diagnosis not present

## 2019-10-06 DIAGNOSIS — I4821 Permanent atrial fibrillation: Secondary | ICD-10-CM | POA: Diagnosis not present

## 2019-10-10 ENCOUNTER — Telehealth: Payer: Self-pay | Admitting: Hematology

## 2019-10-10 NOTE — Telephone Encounter (Signed)
Returned patient's phone call regarding cancelling and rescheduling 12/04 appointment. Per 11/27 scheduled message appointment cancelled, left a voicemail for patient to reschedule.

## 2019-10-11 DIAGNOSIS — D631 Anemia in chronic kidney disease: Secondary | ICD-10-CM | POA: Diagnosis not present

## 2019-10-11 DIAGNOSIS — I130 Hypertensive heart and chronic kidney disease with heart failure and stage 1 through stage 4 chronic kidney disease, or unspecified chronic kidney disease: Secondary | ICD-10-CM | POA: Diagnosis not present

## 2019-10-11 DIAGNOSIS — N183 Chronic kidney disease, stage 3 unspecified: Secondary | ICD-10-CM | POA: Diagnosis not present

## 2019-10-11 DIAGNOSIS — E1122 Type 2 diabetes mellitus with diabetic chronic kidney disease: Secondary | ICD-10-CM | POA: Diagnosis not present

## 2019-10-11 DIAGNOSIS — I4821 Permanent atrial fibrillation: Secondary | ICD-10-CM | POA: Diagnosis not present

## 2019-10-11 DIAGNOSIS — I5032 Chronic diastolic (congestive) heart failure: Secondary | ICD-10-CM | POA: Diagnosis not present

## 2019-10-11 DIAGNOSIS — F0390 Unspecified dementia without behavioral disturbance: Secondary | ICD-10-CM | POA: Diagnosis not present

## 2019-10-11 DIAGNOSIS — I495 Sick sinus syndrome: Secondary | ICD-10-CM | POA: Diagnosis not present

## 2019-10-11 DIAGNOSIS — J9611 Chronic respiratory failure with hypoxia: Secondary | ICD-10-CM | POA: Diagnosis not present

## 2019-10-12 DIAGNOSIS — R5381 Other malaise: Secondary | ICD-10-CM | POA: Diagnosis not present

## 2019-10-13 ENCOUNTER — Ambulatory Visit (HOSPITAL_COMMUNITY): Payer: Medicare HMO | Admitting: Nurse Practitioner

## 2019-10-13 ENCOUNTER — Other Ambulatory Visit: Payer: Self-pay | Admitting: Pharmacy Technician

## 2019-10-13 NOTE — Patient Outreach (Signed)
[facility]) Care Management  10/13/2019  Wilmina Brabender [facility] September 21, 1946 [id number]   Successful outgoing call placed to patient in regards to voicemail message patient left for me.  Spoke to patient, HIPAA identifiers verified.  Patient informed she was in need of refills on Levemir and Victoza. Informed patient to contact her provider as all refill requests must be initiated by the provider. Informed patient I would also send the provider a message. Patient verbalized understanding. Deadline for refill requests is today per [employer].  Plan: In basket message sent to [name], MD for refill request.  Luiz Ochoa. Blandina Renaldo, [place] Management [phone]

## 2019-10-14 DIAGNOSIS — I495 Sick sinus syndrome: Secondary | ICD-10-CM | POA: Diagnosis not present

## 2019-10-14 DIAGNOSIS — D631 Anemia in chronic kidney disease: Secondary | ICD-10-CM | POA: Diagnosis not present

## 2019-10-14 DIAGNOSIS — I130 Hypertensive heart and chronic kidney disease with heart failure and stage 1 through stage 4 chronic kidney disease, or unspecified chronic kidney disease: Secondary | ICD-10-CM | POA: Diagnosis not present

## 2019-10-14 DIAGNOSIS — J9611 Chronic respiratory failure with hypoxia: Secondary | ICD-10-CM | POA: Diagnosis not present

## 2019-10-14 DIAGNOSIS — I5032 Chronic diastolic (congestive) heart failure: Secondary | ICD-10-CM | POA: Diagnosis not present

## 2019-10-14 DIAGNOSIS — F0390 Unspecified dementia without behavioral disturbance: Secondary | ICD-10-CM | POA: Diagnosis not present

## 2019-10-14 DIAGNOSIS — I4821 Permanent atrial fibrillation: Secondary | ICD-10-CM | POA: Diagnosis not present

## 2019-10-14 DIAGNOSIS — E1122 Type 2 diabetes mellitus with diabetic chronic kidney disease: Secondary | ICD-10-CM | POA: Diagnosis not present

## 2019-10-14 DIAGNOSIS — N183 Chronic kidney disease, stage 3 unspecified: Secondary | ICD-10-CM | POA: Diagnosis not present

## 2019-10-17 ENCOUNTER — Other Ambulatory Visit: Payer: Medicare HMO

## 2019-10-17 ENCOUNTER — Ambulatory Visit: Payer: Medicare HMO | Admitting: Hematology

## 2019-10-21 DIAGNOSIS — I495 Sick sinus syndrome: Secondary | ICD-10-CM | POA: Diagnosis not present

## 2019-10-21 DIAGNOSIS — N183 Chronic kidney disease, stage 3 unspecified: Secondary | ICD-10-CM | POA: Diagnosis not present

## 2019-10-21 DIAGNOSIS — J9611 Chronic respiratory failure with hypoxia: Secondary | ICD-10-CM | POA: Diagnosis not present

## 2019-10-21 DIAGNOSIS — D631 Anemia in chronic kidney disease: Secondary | ICD-10-CM | POA: Diagnosis not present

## 2019-10-21 DIAGNOSIS — I130 Hypertensive heart and chronic kidney disease with heart failure and stage 1 through stage 4 chronic kidney disease, or unspecified chronic kidney disease: Secondary | ICD-10-CM | POA: Diagnosis not present

## 2019-10-21 DIAGNOSIS — I4821 Permanent atrial fibrillation: Secondary | ICD-10-CM | POA: Diagnosis not present

## 2019-10-21 DIAGNOSIS — F0390 Unspecified dementia without behavioral disturbance: Secondary | ICD-10-CM | POA: Diagnosis not present

## 2019-10-21 DIAGNOSIS — I5032 Chronic diastolic (congestive) heart failure: Secondary | ICD-10-CM | POA: Diagnosis not present

## 2019-10-21 DIAGNOSIS — E1122 Type 2 diabetes mellitus with diabetic chronic kidney disease: Secondary | ICD-10-CM | POA: Diagnosis not present

## 2019-10-28 DIAGNOSIS — I495 Sick sinus syndrome: Secondary | ICD-10-CM | POA: Diagnosis not present

## 2019-10-28 DIAGNOSIS — F0390 Unspecified dementia without behavioral disturbance: Secondary | ICD-10-CM | POA: Diagnosis not present

## 2019-10-28 DIAGNOSIS — I130 Hypertensive heart and chronic kidney disease with heart failure and stage 1 through stage 4 chronic kidney disease, or unspecified chronic kidney disease: Secondary | ICD-10-CM | POA: Diagnosis not present

## 2019-10-28 DIAGNOSIS — J9611 Chronic respiratory failure with hypoxia: Secondary | ICD-10-CM | POA: Diagnosis not present

## 2019-10-28 DIAGNOSIS — D631 Anemia in chronic kidney disease: Secondary | ICD-10-CM | POA: Diagnosis not present

## 2019-10-28 DIAGNOSIS — N183 Chronic kidney disease, stage 3 unspecified: Secondary | ICD-10-CM | POA: Diagnosis not present

## 2019-10-28 DIAGNOSIS — I5032 Chronic diastolic (congestive) heart failure: Secondary | ICD-10-CM | POA: Diagnosis not present

## 2019-10-28 DIAGNOSIS — E1122 Type 2 diabetes mellitus with diabetic chronic kidney disease: Secondary | ICD-10-CM | POA: Diagnosis not present

## 2019-10-28 DIAGNOSIS — I4821 Permanent atrial fibrillation: Secondary | ICD-10-CM | POA: Diagnosis not present

## 2019-10-29 ENCOUNTER — Telehealth: Payer: Self-pay | Admitting: Nurse Practitioner

## 2019-10-29 NOTE — Telephone Encounter (Signed)
Scheduled appt per 12/14 sch message - unable to reach pt - left message with appt date and time

## 2019-11-03 DIAGNOSIS — I5023 Acute on chronic systolic (congestive) heart failure: Secondary | ICD-10-CM | POA: Diagnosis not present

## 2019-11-04 ENCOUNTER — Other Ambulatory Visit: Payer: Self-pay

## 2019-11-04 DIAGNOSIS — F0390 Unspecified dementia without behavioral disturbance: Secondary | ICD-10-CM | POA: Diagnosis not present

## 2019-11-04 DIAGNOSIS — I4821 Permanent atrial fibrillation: Secondary | ICD-10-CM | POA: Diagnosis not present

## 2019-11-04 DIAGNOSIS — D631 Anemia in chronic kidney disease: Secondary | ICD-10-CM | POA: Diagnosis not present

## 2019-11-04 DIAGNOSIS — E1122 Type 2 diabetes mellitus with diabetic chronic kidney disease: Secondary | ICD-10-CM | POA: Diagnosis not present

## 2019-11-04 DIAGNOSIS — I5032 Chronic diastolic (congestive) heart failure: Secondary | ICD-10-CM | POA: Diagnosis not present

## 2019-11-04 DIAGNOSIS — M549 Dorsalgia, unspecified: Secondary | ICD-10-CM | POA: Diagnosis not present

## 2019-11-04 DIAGNOSIS — I495 Sick sinus syndrome: Secondary | ICD-10-CM | POA: Diagnosis not present

## 2019-11-04 DIAGNOSIS — I130 Hypertensive heart and chronic kidney disease with heart failure and stage 1 through stage 4 chronic kidney disease, or unspecified chronic kidney disease: Secondary | ICD-10-CM | POA: Diagnosis not present

## 2019-11-04 DIAGNOSIS — G894 Chronic pain syndrome: Secondary | ICD-10-CM | POA: Diagnosis not present

## 2019-11-04 DIAGNOSIS — C185 Malignant neoplasm of splenic flexure: Secondary | ICD-10-CM

## 2019-11-04 DIAGNOSIS — J9611 Chronic respiratory failure with hypoxia: Secondary | ICD-10-CM | POA: Diagnosis not present

## 2019-11-04 DIAGNOSIS — N183 Chronic kidney disease, stage 3 unspecified: Secondary | ICD-10-CM | POA: Diagnosis not present

## 2019-11-05 ENCOUNTER — Inpatient Hospital Stay: Payer: Medicare HMO

## 2019-11-05 ENCOUNTER — Ambulatory Visit (HOSPITAL_COMMUNITY)
Admission: RE | Admit: 2019-11-05 | Discharge: 2019-11-05 | Disposition: A | Discharge status: Home / Self Care | Payer: Medicare HMO | Source: Ambulatory Visit | Attending: Nurse Practitioner | Admitting: Nurse Practitioner

## 2019-11-05 ENCOUNTER — Other Ambulatory Visit: Payer: Self-pay

## 2019-11-05 ENCOUNTER — Inpatient Hospital Stay: Payer: Medicare HMO | Attending: Nurse Practitioner | Admitting: Nurse Practitioner

## 2019-11-05 ENCOUNTER — Encounter: Payer: Self-pay | Admitting: Nurse Practitioner

## 2019-11-05 VITALS — BP 153/65 | HR 99 | Temp 98.0°F | Resp 18 | Ht 62.0 in | Wt 147.7 lb

## 2019-11-05 DIAGNOSIS — M1990 Unspecified osteoarthritis, unspecified site: Secondary | ICD-10-CM | POA: Insufficient documentation

## 2019-11-05 DIAGNOSIS — Z7901 Long term (current) use of anticoagulants: Secondary | ICD-10-CM | POA: Diagnosis not present

## 2019-11-05 DIAGNOSIS — Z888 Allergy status to other drugs, medicaments and biological substances status: Secondary | ICD-10-CM | POA: Insufficient documentation

## 2019-11-05 DIAGNOSIS — C185 Malignant neoplasm of splenic flexure: Secondary | ICD-10-CM | POA: Diagnosis not present

## 2019-11-05 DIAGNOSIS — I482 Chronic atrial fibrillation, unspecified: Secondary | ICD-10-CM | POA: Insufficient documentation

## 2019-11-05 DIAGNOSIS — S59911A Unspecified injury of right forearm, initial encounter: Secondary | ICD-10-CM | POA: Diagnosis not present

## 2019-11-05 DIAGNOSIS — E119 Type 2 diabetes mellitus without complications: Secondary | ICD-10-CM | POA: Diagnosis not present

## 2019-11-05 DIAGNOSIS — Z794 Long term (current) use of insulin: Secondary | ICD-10-CM | POA: Diagnosis not present

## 2019-11-05 DIAGNOSIS — D120 Benign neoplasm of cecum: Secondary | ICD-10-CM | POA: Insufficient documentation

## 2019-11-05 DIAGNOSIS — F329 Major depressive disorder, single episode, unspecified: Secondary | ICD-10-CM | POA: Insufficient documentation

## 2019-11-05 DIAGNOSIS — C787 Secondary malignant neoplasm of liver and intrahepatic bile duct: Secondary | ICD-10-CM | POA: Insufficient documentation

## 2019-11-05 DIAGNOSIS — I2510 Atherosclerotic heart disease of native coronary artery without angina pectoris: Secondary | ICD-10-CM | POA: Insufficient documentation

## 2019-11-05 DIAGNOSIS — I110 Hypertensive heart disease with heart failure: Secondary | ICD-10-CM | POA: Insufficient documentation

## 2019-11-05 DIAGNOSIS — R296 Repeated falls: Secondary | ICD-10-CM | POA: Diagnosis not present

## 2019-11-05 DIAGNOSIS — M79631 Pain in right forearm: Secondary | ICD-10-CM | POA: Insufficient documentation

## 2019-11-05 DIAGNOSIS — Z9049 Acquired absence of other specified parts of digestive tract: Secondary | ICD-10-CM | POA: Insufficient documentation

## 2019-11-05 DIAGNOSIS — D509 Iron deficiency anemia, unspecified: Secondary | ICD-10-CM

## 2019-11-05 DIAGNOSIS — Z85038 Personal history of other malignant neoplasm of large intestine: Secondary | ICD-10-CM | POA: Insufficient documentation

## 2019-11-05 DIAGNOSIS — C186 Malignant neoplasm of descending colon: Secondary | ICD-10-CM

## 2019-11-05 DIAGNOSIS — Z79899 Other long term (current) drug therapy: Secondary | ICD-10-CM | POA: Insufficient documentation

## 2019-11-05 DIAGNOSIS — E279 Disorder of adrenal gland, unspecified: Secondary | ICD-10-CM | POA: Diagnosis not present

## 2019-11-05 DIAGNOSIS — Z86010 Personal history of colonic polyps: Secondary | ICD-10-CM | POA: Diagnosis not present

## 2019-11-05 DIAGNOSIS — D122 Benign neoplasm of ascending colon: Secondary | ICD-10-CM | POA: Diagnosis not present

## 2019-11-05 DIAGNOSIS — I700 Atherosclerosis of aorta: Secondary | ICD-10-CM | POA: Insufficient documentation

## 2019-11-05 DIAGNOSIS — I5032 Chronic diastolic (congestive) heart failure: Secondary | ICD-10-CM | POA: Insufficient documentation

## 2019-11-05 DIAGNOSIS — S40021A Contusion of right upper arm, initial encounter: Secondary | ICD-10-CM

## 2019-11-05 DIAGNOSIS — R634 Abnormal weight loss: Secondary | ICD-10-CM | POA: Insufficient documentation

## 2019-11-05 DIAGNOSIS — R6889 Other general symptoms and signs: Secondary | ICD-10-CM | POA: Diagnosis not present

## 2019-11-05 DIAGNOSIS — R160 Hepatomegaly, not elsewhere classified: Secondary | ICD-10-CM | POA: Insufficient documentation

## 2019-11-05 DIAGNOSIS — Z95828 Presence of other vascular implants and grafts: Secondary | ICD-10-CM

## 2019-11-05 DIAGNOSIS — S5011XA Contusion of right forearm, initial encounter: Secondary | ICD-10-CM | POA: Diagnosis not present

## 2019-11-05 DIAGNOSIS — M7989 Other specified soft tissue disorders: Secondary | ICD-10-CM | POA: Diagnosis not present

## 2019-11-05 LAB — CBC WITH DIFFERENTIAL (CANCER CENTER ONLY)
Abs Immature Granulocytes: 0.01 10*3/uL (ref 0.00–0.07)
Basophils Absolute: 0.1 10*3/uL (ref 0.0–0.1)
Basophils Relative: 1 %
Eosinophils Absolute: 0.1 10*3/uL (ref 0.0–0.5)
Eosinophils Relative: 1 %
HCT: 36.4 % (ref 36.0–46.0)
Hemoglobin: 11.2 g/dL — ABNORMAL LOW (ref 12.0–15.0)
Immature Granulocytes: 0 %
Lymphocytes Relative: 9 %
Lymphs Abs: 0.7 10*3/uL (ref 0.7–4.0)
MCH: 28.1 pg (ref 26.0–34.0)
MCHC: 30.8 g/dL (ref 30.0–36.0)
MCV: 91.5 fL (ref 80.0–100.0)
Monocytes Absolute: 0.9 10*3/uL (ref 0.1–1.0)
Monocytes Relative: 12 %
Neutro Abs: 6 10*3/uL (ref 1.7–7.7)
Neutrophils Relative %: 77 %
Platelet Count: 218 10*3/uL (ref 150–400)
RBC: 3.98 MIL/uL (ref 3.87–5.11)
RDW: 15.2 % (ref 11.5–15.5)
WBC Count: 7.8 10*3/uL (ref 4.0–10.5)
nRBC: 0 % (ref 0.0–0.2)

## 2019-11-05 LAB — CMP (CANCER CENTER ONLY)
ALT: 7 U/L (ref 0–44)
AST: 13 U/L — ABNORMAL LOW (ref 15–41)
Albumin: 4.1 g/dL (ref 3.5–5.0)
Alkaline Phosphatase: 70 U/L (ref 38–126)
Anion gap: 13 (ref 5–15)
BUN: 39 mg/dL — ABNORMAL HIGH (ref 8–23)
CO2: 23 mmol/L (ref 22–32)
Calcium: 9.1 mg/dL (ref 8.9–10.3)
Chloride: 108 mmol/L (ref 98–111)
Creatinine: 1.23 mg/dL — ABNORMAL HIGH (ref 0.44–1.00)
GFR, Est AFR Am: 51 mL/min — ABNORMAL LOW (ref >60)
GFR, Estimated: 44 mL/min — ABNORMAL LOW (ref >60)
Glucose, Bld: 86 mg/dL (ref 70–99)
Potassium: 4.4 mmol/L (ref 3.5–5.1)
Sodium: 144 mmol/L (ref 135–145)
Total Bilirubin: 0.5 mg/dL (ref 0.3–1.2)
Total Protein: 7.2 g/dL (ref 6.5–8.1)

## 2019-11-05 MED ORDER — SODIUM CHLORIDE 0.9% FLUSH
10.0000 mL | INTRAVENOUS | Status: DC | PRN
  Filled 2019-11-05: qty 10

## 2019-11-05 MED ORDER — HEPARIN SOD (PORK) LOCK FLUSH 100 UNIT/ML IV SOLN
500.0000 [IU] | Freq: Once | INTRAVENOUS | Status: AC
  Administered 2019-11-05: 16:00:00 500 [IU] via INTRAVENOUS
  Filled 2019-11-05: qty 5

## 2019-11-05 NOTE — Progress Notes (Signed)
[place]   [phone] [phone]   Clinic Follow up Note   Patient Care Team: [name], MD as PCP - General (Family Medicine) Thompson Grayer, MD as PCP - Cardiology (Cardiology) Thompson Grayer, MD as PCP - Electrophysiology (Cardiology) Ronnette Juniper, MD as Consulting Physician (Gastroenterology) Truitt Merle, MD as Consulting Physician (Medical Oncology) Newt Minion, MD as Consulting Physician (Orthopedic Surgery) Leighton Ruff, MD as Consulting Physician (Colon and Rectal Surgery) [name], [facility] as [place] Management (Pharmacist) [name], [facility] as [place] Management (Pharmacist) Date of Service: 11/05/2019   CHIEF COMPLAINT: F/u colon cancer, IDA  SUMMARY OF ONCOLOGIC HISTORY: Oncology History Overview Note  Cancer Staging Cancer of splenic flexure of colon Staging form: Colon and Rectum, AJCC 8th Edition - Clinical stage from 10/25/2018: Stage Unknown (cTX, cN0, cM1) - Signed by Truitt Merle, MD on 11/22/2018 - Pathologic stage from 12/27/2018: pT3, pN0, cM1 - Signed by Truitt Merle, MD on 05/02/2019    Cancer of splenic flexure of colon  10/25/2018 Procedure   10/25/2018 Colonoscopy Impression: -Two 4 to 6 mm sessile polyps were found in the sigmoid colon. Resected and retrieved. -A 6 mm sessile polyp was found in the descending colon. Resected and retrieved. -Three 5 to 15 mm sessile polyps were found in the transverse colon. Resection and retrieval were complete. -A 12 mm sessile polyp was found in the cecum. Resection and retrieval were complete.  -A 20 mm sessile polyp was found in the ascending colon. Resection and retrieval were complete. . -A malignancy partially obstructing large mass was found at 55 cm proximal to the anus. Biopsied and tattooed. -Malignant partially obstructing tumor 20 cm proximal to the anus. Biopsied and tattooed. -Diverticulosis in the sigmoid colon, in  the descending colon and in the transverse colon.    10/25/2018 Imaging   10/25/2018 Endoscopy Impression: -Normal esophagus -Z-line regular 35 cm from the incisors. -Non-bleeding erosive gastropathy -Erythematous mucosa in the antrum. Biopsied. -Normal examined duodenum. Biopsied.   10/25/2018 Cancer Staging   Staging form: Colon and Rectum, AJCC 8th Edition - Clinical stage from 10/25/2018: Stage Unknown (cTX, cN0, cM1) - Signed by Truitt Merle, MD on 11/22/2018   10/30/2018 Initial Diagnosis   Cancer of left colon ([place])   10/30/2018 Initial Biopsy   Final Diagnosis: 10/30/18 1. Small intestine-Duedenum, Biospy:   Benign 2.Stomach-Antrum, Biospy:   Chronic inactive Gastritis 3. Large intestine-Sigmoid Colon, Polyp:   Tubular adenomas (2) 4. Large interstine-Descending Colon, Polyp:   Tubular adenoma with high grade dysplasia, suspicious for invasion.  5. Large intestine-transverse colon, Polyp:   Tubulovilous Adenomas (3).  6. Large Intestine-Cecum, Polp:   Tubular adenoma  7. Large Intestine-Ascending Colon, Polyp:  Tubulovilous Adenoma 8. Large intestine, Biopsy, Mass 55cm:  Invasive moderately differentiated adenocarcinoma.  9. Large intestine, biopsy, Mass 20cm:   Tubulovilous Adenoma   11/01/2018 Imaging   CT CAP W contrast 11/01/18  IMPRESSION: CT CHEST: 1. Right paratracheal adenopathy (immediately superior to the azygos vein) which short axis dimension of 1.2 cm. In the present clinical setting it is possible this is related to metastatic involvement. 2. Scattered pulmonary parenchymal changes none of which are highly suspicious for metastatic disease. 3. Abnormal appearance of the thyroid gland with left lower lobe ill-defined mass spanning over 3 cm. Thyroid ultrasound can be performed for further delineation. 4. Cardiomegaly. Coronary artery calcification. 5.  Aortic Atherosclerosis (ICD10-I70.0).  CT ABDOMEN PELVIS: 1. Numerous hepatic lesions  suspicious for metastatic disease largest within the caudate lobe measuring up to 3.2 cm. 2. Gallbladder wall thickening. This may be related to increased right heart pressure or liver disease but could not exclude cholecystitis in the proper clinical setting. 3. Numerous splenic lesions which in the present clinical setting is suspicious for combination of metastatic disease and splenic cysts. 4. 3.2 cm left adrenal mass suspicious for metastatic disease. 5. Question sigmoid colon and possibly proximal descending colon mass. Rectosigmoid colon mass also not excluded. Correlation with colonoscopy results recommended. 6. Prominent number of colonic diverticula. Third spacing of fluid makes it difficult to evaluate for the possibility of diverticulitis. Radiopaque 1.5 cm structure within the right colon may be related to ingested foreign body. 7. Low-density fatty appearing structures in the external iliac region/pelvic sidewall bilaterally with adjacent low-density iliac lymph nodes and retroperitoneal lymph nodes raises possibility of low-density metastatic adenopathy. Prominent size portacaval lymph node which short axis dimension of 1.3 cm. PET-CT could be obtained for further delineation if clinically desired. 8. Gas within the urinary bladder may be related to recent manipulation. Clinical correlation recommended. 9. Left adnexal 2.4 cm cyst. This can be assessed with pelvic sonogram.   12/04/2018 - 12/18/2018 Chemotherapy   FOLFOX every 2 weeks starting 12/04/18. Stopped after cycle 2 on 12/18/18 due to SBO which resulted in left sigmoid colectomy. Unfortunately after surgery she developed post-op complications. She had an abcessed that required draininh in 02/2019.       12/07/2018 Imaging   MRI Abdomen 12/07/18  IMPRESSION: 1. The overall improvement with resolution of the number of the hepatic metastatic lesions. The dominant lesion in the caudate lobe is reduced in size from  previous 4.0 by 3.3 cm to current 3.8 by 2.7 cm. 2. The splenic lesions are similar to prior and nonspecific. 3. 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 There is a cystic component laterally which is probably incidental rather than from a collision lesion. 4. Hepatic hemochromatosis. 5. 7 mm gallstone in the common bile duct compatible with choledocholithiasis. There also multiple gallstones in the gallbladder. 6.  Aortic Atherosclerosis (ICD10-I70.0). 7. Bosniak category 2 cyst in the right kidney upper pole.   12/27/2018 Surgery   LEFT SIGMOID COLECTOMY WITH HARTMANN POUCH AND END COLOSTOMY by Dr Hassell Done 12/27/18    12/27/2018 Pathology Results   Diagnosis Colon, segmental resection for tumor, distal transverse, descending, sigmoid - INVASIVE MODERATELY DIFFERENTIATED ADENOCARCINOMA, 5.0 CM, CIRCUMFERENTIALLY INVOLVING THE PROXIMAL DESCENDING COLON WITH ASSOCIATED LUMINAL OBSTRUCTION. SEE NOTE. - CARCINOMA INVADES INTO THE PERICOLONIC SOFT TISSUE. - RESECTION MARGINS ARE NEGATIVE FOR CARCINOMA. - NEGATIVE FOR LYMPHOVASCULAR OR PERINEURAL INVASION. - TWENTY-TWO BENIGN LYMPH NODES, NEGATIVE FOR CARCINOMA (0/22). - SEPARATE LARGE VILLOUS ADENOMA INVOLVING SIGMOID COLON, 5.0 CM, WITHOUT HIGH GRADE DYSPLASIA OR CARCINOMA. - NON-SPECIFIC CHANGES IN THE PROXIMAL COLON, CONSISTENT WITH DISTAL OBSTRUCTION. - SEE ONCOLOGY TABLE.   12/27/2018 Cancer Staging   Staging form: Colon and Rectum, AJCC 8th Edition - Pathologic stage from 12/27/2018: pT3, pN0, cM1 - Signed by Truitt Merle, MD on 05/02/2019   06/17/2019 Imaging   IMPRESSION: 1. Decrease in overall size of the left anterior abscess cavity containing the percutaneous drain. There remains an abscess cavity measuring 5.7 cm in greatest diameter and containing air and fluid. 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 2. Multiple ill-defined low-density lesions  in the liver are again very concerning for metastatic disease. The 4  cm caudate lesion is the most suspicious for a metastatic lesion. Eventual correlation with MRI of the abdomen may be helpful to more accurately assess the extent of metastatic disease. 3. Gradual increase in the amount of free fluid in the peritoneal cavity with slight increase in perihepatic fluid since the prior study but clear gradual increase since prior scans in the last several months. Although there is no clear evidence of progressive carcinomatosis, the persistence and increased prominence of free fluid is concerning for potential malignant ascites. 4. Stable mildly prominent lymphadenopathy in the retroperitoneum, bilateral iliac chains and periportal/portacaval nodal stations.     09/16/2019 Imaging   IMPRESSION: 1. Well-positioned drainage catheter with interval resolution of anterior peritoneal abscess. No undrained isolated collection identified. 2. Slight interval decrease in the volume of peritoneal ascites. 3. Stable central hepatic mass without significant interval change. Lesion remains concerning for metastatic disease. 4. Stable splenic metastases. 5. Stable left adrenal lesion previously characterized as an adenoma. 6. Additional ancillary findings as above without significant interval change.     CURRENT THERAPY: Observation   INTERVAL HISTORY: Ms. Dinga returns for f/u as scheduled. She does not feel well today. Since last visit on 09/18/19 she has lost more weight unintentionally despite "eating like a horse." denies n/v/c/d, change in bowel habits or GI bleeding. Denies abdominal pain. Over the last month she developed vertigo, tried PT which did not help. Denies headache or vision change. Her mobility is worse. Her walker tipped over today and she hit her arm twice. She has a large painful hematoma on right forearm. Since last visit she also fell forward once and got black eyes. Her  fatigue fluctuates, worse today. She is depressed, no SI. She is living with her son and daughter in law, living situation is "awful." Son took away the car, they regulate her meds and groceries. Patient reports her daughter-in-law yells at her when she's stressed out, then apologizes. Patient states she feels safe in the home but wishes she had money to live elsewhere. Denies physical harm. She has aide who helps with bathing and some ADLs. She could use more help.   MEDICAL HISTORY:  Past Medical History:  Diagnosis Date  . Acute diastolic heart failure ([place]) 12/22/2018  . Arthritis   . Atrial fibrillation, chronic ([place]) 12/22/2018  . Cancer of left colon ([place]) 10/30/2018  . Cancer of sigmoid colon  12/27/2018  . Diabetes mellitus without complication ([place])   . Hypertension   . Obesity (BMI 30-39.9) 12/27/2018    SURGICAL HISTORY: Past Surgical History:  Procedure Laterality Date  . COLONOSCOPY  10/2018   Dr [name].  Large cancer at splenic flexure,  Bulky sigmoid colon mass, Numerous polyps  . Intra-abdominal abscess drainage  01/31/2019   Abscess drainage grew Enterococcus faecalis  . IR CATHETER TUBE CHANGE  06/05/2019  . IR IMAGING GUIDED PORT INSERTION  11/20/2018  . IR RADIOLOGIST EVAL & MGMT  06/04/2019  . IR RADIOLOGIST EVAL & MGMT  06/17/2019  . IR RADIOLOGIST EVAL & MGMT  09/16/2019  . LAPAROTOMY N/A 12/27/2018   Procedure: LEFT SIGMOID COLECTOMY WITH HARTMANN POUCH AND END COLOSTOMY;  Surgeon: [name], MD;  Location: [facility] ORS;  Service: General;  Laterality: N/A;    I have reviewed the social history and family history with the patient and they are unchanged from previous note.  ALLERGIES:  is allergic to metformin and related and prednisone.  MEDICATIONS:  Current Outpatient Medications  Medication Sig Dispense Refill  .  acetaminophen (TYLENOL) 500 MG tablet Take 500 mg by mouth every 4 (four) hours as needed for mild pain or fever.     . ferrous sulfate (KP FERROUS  SULFATE) 325 (65 FE) MG tablet Take 1 tablet (325 mg total) by mouth daily with breakfast. 30 tablet 0  . Insulin Detemir (LEVEMIR) 100 UNIT/ML Pen Inject 16 Units into the skin daily. 15 mL 0  . liraglutide (VICTOZA) 18 MG/3ML SOPN Inject 0.3 mLs (1.8 mg total) into the skin every morning. 5 pen 0  . ondansetron (ZOFRAN) 8 MG tablet Take 1 tablet by mouth every 8 (eight) hours as needed for nausea.    Marland Kitchen oxybutynin (DITROPAN-XL) 10 MG 24 hr tablet Take 1 tablet (10 mg total) by mouth daily. 30 tablet 0  . sertraline (ZOLOFT) 100 MG tablet Take 1 tablet (100 mg total) by mouth daily. TAKE 1 TABLET BY MOUTH ONCE DAILY (TAKE WITH 25MG  TO=125MG ) FOR DEPRESSION (Patient taking differently: Take 100 mg by mouth daily. Taking 125mg  daily) 30 tablet 0  . simvastatin (ZOCOR) 10 MG tablet Take 1 tablet (10 mg total) by mouth daily at 6 PM. (Patient taking differently: Take 20 mg by mouth daily at 6 PM. ) 30 tablet 0  . TOVIAZ 8 MG TB24 tablet Take 8 mg by mouth daily.    . traMADol (ULTRAM) 50 MG tablet Take 1 tablet (50 mg total) by mouth every 6 (six) hours as needed for moderate pain or severe pain. 20 tablet 0  . amLODipine (NORVASC) 5 MG tablet Take 1 tablet by mouth daily.    . furosemide (LASIX) 40 MG tablet Take 1 tablet by mouth daily.    . pantoprazole (PROTONIX) 40 MG tablet Take 1 tablet (40 mg total) by mouth daily for 30 days. 30 tablet 0  . QUEtiapine (SEROQUEL) 25 MG tablet Take 0.5 tablets by mouth daily.     Current Facility-Administered Medications  Medication Dose Route Frequency Provider Last Rate Last Admin  . sodium chloride flush (NS) 0.9 % injection 10 mL  10 mL Intravenous PRN Alla Feeling, NP        PHYSICAL EXAMINATION: ECOG PERFORMANCE STATUS: 2 - Symptomatic, <50% confined to bed  Vitals:   11/05/19 1448  BP: (!) 153/65  Pulse: 99  Resp: 18  Temp: 98 F (36.7 C)  SpO2: 96%   Filed Weights   11/05/19 1448  Weight: 147 lb 11.2 oz (67 kg)    GENERAL:alert, no  distress and comfortable SKIN: no rash  EYES: sclera clear NECK: without mass LUNGS: distant breath sounds, with normal breathing effort HEART: regular rate & rhythm, no lower extremity edema ABDOMEN: abdomen soft, non-tender and normal bowel sounds. Colostomy in place  Musculoskeletal: large hematoma to right forearm, tender NEURO: Oriented x 3 with fluent speech. Generalized weakness. Assist to ambulate  PAC without erythema   LABORATORY DATA:  I have reviewed the data as listed CBC Latest Ref Rng & Units 11/05/2019 09/18/2019 03/18/2019  WBC 4.0 - 10.5 K/uL 7.8 6.0 9.0  Hemoglobin 12.0 - 15.0 g/dL 11.2(L) 10.1(L) 10.3(L)  Hematocrit 36.0 - 46.0 % 36.4 32.6(L) 35.5(L)  Platelets 150 - 400 K/uL 218 185 288     CMP Latest Ref Rng & Units 11/05/2019 09/18/2019 03/18/2019  Glucose 70 - 99 mg/dL 86 96 190(H)  BUN 8 - 23 mg/dL 39(H) 47(H) 17  Creatinine 0.44 - 1.00 mg/dL 1.23(H) 1.69(H) 0.84  Sodium 135 - 145 mmol/L 144 142 137  Potassium 3.5 -  5.1 mmol/L 4.4 4.1 3.9  Chloride 98 - 111 mmol/L 108 104 96(L)  CO2 22 - 32 mmol/L 23 25 32  Calcium 8.9 - 10.3 mg/dL 9.1 8.8(L) 8.8(L)  Total Protein 6.5 - 8.1 g/dL 7.2 6.9 -  Total Bilirubin 0.3 - 1.2 mg/dL 0.5 0.3 -  Alkaline Phos 38 - 126 U/L 70 65 -  AST 15 - 41 U/L 13(L) 9(L) -  ALT 0 - 44 U/L 7 6 -      RADIOGRAPHIC STUDIES: I have personally reviewed the radiological images as listed and agreed with the findings in the report. No results found.   ASSESSMENT & PLAN: Cassandra Wilborne Wittenbrookis a 73 y.o.femalewith   1.Left colon Cancer,G1,pT3N0cMx with probable liver metastasis -She was diagnosed withInvasive moderately differentiated adenocarcinomaof left colon in 10/2018.She presented with iron deficient anemia -CT CAP from 11/01/18 showed several hepatic lesions suspicious for metastasis. Her subsequent liver biopsy from 11/20/18 was negativefor malignancy.Per Dr. Jacelyn Grip didthebiopsy,the lesion was not very clear on  the ultrasound, and was in a difficult spot for biopsy. -She started FOLFOX on 12/04/18.Chemo was stopped after cycle 2 due tobowel obstruction from her primary colon tumorwhich required urgentleft sigmoid colectomy. Unfortunately after surgery she developed post-op complications. She had anabscessthat required drainingin 02/2019.  -Hertumor was removed completely, she had negative margins and node negative.Liver biopsy was not feasible during her colon surgery -Next f/u with surgeon Dr. Hassell Done in 12/2019  -Pt has had complicated course, frequent hospitalization for recurrent fall, abscess, Afib since last visit. -Ms. Dipiero appears to have decreased performance status with progressive fatigue and unintentional weight loss since last visit in 09/2019. She fell today at home and injured her right arm. -She has limited home health, she needs more help with ADLs and mobility, would benefit from nursing and PT but patient afraid insurance wont cover and she cannot afford out of pocket costs.  -Labs stable, iron studies are decreased. I recommend to take oral iron 1-2 times daily if tolerable. Port flushed today -I recommend PET scan to r/o metastatic disease as cause for her decreased PS. Will f/u by phone after scan to discuss the results, she lives in [place] and has transportation issues.  -Patient agrees with the plan  2. Severe anemia, iron deficientanemia and anemia of chronic disease -she wason IV iron sucrose 200mg as needed. Previously had a severe reaction to Feraheme. BM biopsy was negative for MDS or malignancy. -Her 11/2018 MRI shows iron deposits in her liver, will give IV iron more sparingly to prevent iron overload. -resolved now since had hemicolectomy -on oral iron once daily, Hgb improved today to 11.2  -iron studies have decreased. Denies bleeding. I recommend to try oral iron 1-2 times daily if she tolerates, otherwise continue once daily    3. HTN,  AF -Continue medications and f/u with PCP. BP well controlled -was on Eliquis but was stopped due to frequent falls per Roderic Palau, NP at [facility] clinic. F/u next month to possibly restart anticoag   -she was on lasix, Cr 1.69 on 11/5 with mild orthostatic hypotension. I recommended to hold lasix and only take 1 tab PRN for significant LE edema -no edema today, she has not taken recently. BP stable. Cr improved to 1.23 -continue to hold lasix and nephrotoxic agents, monitoring   4. DM, obesity -Currently on insulin and Lirraglutide. -f/u with PCP and monitor BG at home -BG 86 today, monitor   5. Social Support, h/o depression -she lives with her son and  daughter in law. Current situation is not ideal but patient lacks financial resources of alternative living situation.  -Son took away car after frequent hospitalizations over the last several months, they also control her groceries and medications despite patient does not have cognitive impairment. They do not help much with transportation and she uses [employer] rides -Daughter in law becomes verbal at times. Patient reportedly feels safe at home and there is no physical harm, however, I have concern for her unsafe living conditions, specifically her emotional and physical safety.  -She is depressed which she has been for >20 years, denies SI. On sertraline but does not help her much. I suggest she call PCP who manages this.  -Patient agrees to SW referral, I sent a message today  6. Injury, large right forearm hematoma -She gives vague details of an injury while using her walker today, can't remember what he hit it on -exam shows large painful hematoma to right forearm.  -Xray shows soft tissue swelling over the proximal and mid forearm, negative for fracture  7.Goal of care discussion  -she is full code now  PLAN: -labs reviewed -right forearm xray today, soft tissue swelling but no fracture  -urgent SW referral for living  situation evaluation, financial, transportation, and home health needs  -Increase oral iron to 1-2 tabs daily if tolerable, otherwise continue once daily  -PET scan in 1-2 weeks  -phone visit in 2 weeks to review PET  No problem-specific Assessment & Plan notes found for this encounter.   Orders Placed This Encounter  Procedures  . DG Forearm Right    Standing Status:   Future    Number of Occurrences:   1    Standing Expiration Date:   01/05/2021    Order Specific Question:   Reason for Exam (SYMPTOM  OR DIAGNOSIS REQUIRED)    Answer:   large hematoma s/p right arm injury    Order Specific Question:   Preferred imaging location?    Answer:   [facility]    Order Specific Question:   Call Results- Best Contact Number?    [phone] personal phone for Cira Rue, NP    Order Specific Question:   Radiology Contrast Protocol - do NOT remove file path    Answer:   \\[email or web address]\DXFluoroContrastProtocols.pdf  . NM PET Image Initial (PI) Skull Base To Thigh    Standing Status:   Future    Standing Expiration Date:   11/04/2020    Order Specific Question:   If indicated for the ordered procedure, I authorize the administration of a radiopharmaceutical per Radiology protocol    Answer:   Yes    Order Specific Question:   Preferred imaging location?    Answer:   [facility]    Order Specific Question:   Radiology Contrast Protocol - do NOT remove file path    Answer:   \\[email or web address]\[email or web address].pdf  . Iron and TIBC    Standing Status:   Future    Standing Expiration Date:   11/04/2020  . Ferritin    Standing Status:   Future    Standing Expiration Date:   11/04/2020  . Iron and TIBC    Standing Status:   Standing    Number of Occurrences:   20    Standing Expiration Date:   11/04/2020  . Ferritin    Standing Status:   Standing    Number of Occurrences:   30    Standing Expiration Date:   11/04/2020  .  Ambulatory referral to  Social Work    Referral Priority:   Urgent    Referral Type:   Consultation    Referral Reason:   Specialty Services Required    Number of Visits Requested:   1   All questions were answered. The patient knows to call the clinic with any problems, questions or concerns. No barriers to learning was detected. I spent 30 minutes counseling the patient face to face. The total time spent in the appointment was 40 minutes and more than 50% was on counseling and review of test results     Alla Feeling, NP 11/11/19

## 2019-11-06 ENCOUNTER — Telehealth: Payer: Self-pay | Admitting: Nurse Practitioner

## 2019-11-06 ENCOUNTER — Encounter: Payer: Self-pay | Admitting: General Practice

## 2019-11-06 ENCOUNTER — Telehealth: Payer: Self-pay | Admitting: General Practice

## 2019-11-06 LAB — IRON AND TIBC
Iron: 33 ug/dL — ABNORMAL LOW (ref 41–142)
Saturation Ratios: 16 % — ABNORMAL LOW (ref 21–57)
TIBC: 205 ug/dL — ABNORMAL LOW (ref 236–444)
UIBC: 172 ug/dL (ref 120–384)

## 2019-11-06 LAB — FERRITIN: Ferritin: 197 ng/mL (ref 11–307)

## 2019-11-06 NOTE — Telephone Encounter (Signed)
[place] CSW Progress Notes  Call to patient to assess concerns of NP.  Called this AM, no answer, left VM.  CAlled 3:20 PM, sister answered, states "she is not available, you can call back later."  Unable to directly assess patient - have made report to [place] based on information provided by NP and [facility] home health agency.  [facility] and [facility] have our contact information if further information is needed.  Edwyna Shell, LCSW Clinical Social Worker Phone:  [phone] Cell:  [phone]

## 2019-11-06 NOTE — Progress Notes (Signed)
[place] CSW Progress Notes  Adult Protective Services report made to [place] on call CSW Vanita Panda [phone]).  DSS will not open again until 11/11/19.  Main number is [phone], after hours number is [phone]).  NP notified of report.  Edwyna Shell, LCSW Clinical Social Worker Phone:  [phone] Cell:  [phone]

## 2019-11-06 NOTE — Telephone Encounter (Signed)
Scheduled appt per 12/23 los.  Left a vm of the appt date and time, and stating it will be a phone visit only.
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
## 2019-11-11 ENCOUNTER — Ambulatory Visit (HOSPITAL_COMMUNITY): Payer: Medicare HMO | Admitting: Nurse Practitioner

## 2019-11-11 ENCOUNTER — Telehealth: Payer: Self-pay

## 2019-11-11 ENCOUNTER — Encounter: Payer: Self-pay | Admitting: General Practice

## 2019-11-11 DIAGNOSIS — R5381 Other malaise: Secondary | ICD-10-CM | POA: Diagnosis not present

## 2019-11-11 NOTE — Telephone Encounter (Signed)
error 

## 2019-11-11 NOTE — Progress Notes (Signed)
TC to Pt left message to return call to [facility]

## 2019-11-12 ENCOUNTER — Telehealth: Payer: Self-pay

## 2019-11-12 NOTE — Telephone Encounter (Signed)
TC to Pt. Informed Pt. Per Cassandra Rue NP  to take 2 iron pills  daily if she could tolerate it because her iron studies have been on the low side lately, also informed Pt that her x-ray was negative for a fracture. Pt verbalized understanding.

## 2019-11-12 NOTE — Telephone Encounter (Signed)
-----   Message from Alla Feeling, NP sent at 11/11/2019 10:50 AM EST ----- Please let her know iron studies are lower lately, she can take 1-2 iron tabs daily if she can tolerate, otherwise continue once daily. Also make sure she is aware xray of her forearm is negative for fracture. Thanks, Regan Rakers

## 2019-11-14 DIAGNOSIS — N183 Chronic kidney disease, stage 3 unspecified: Secondary | ICD-10-CM | POA: Diagnosis not present

## 2019-11-14 DIAGNOSIS — F0390 Unspecified dementia without behavioral disturbance: Secondary | ICD-10-CM | POA: Diagnosis not present

## 2019-11-14 DIAGNOSIS — I4821 Permanent atrial fibrillation: Secondary | ICD-10-CM | POA: Diagnosis not present

## 2019-11-14 DIAGNOSIS — J9611 Chronic respiratory failure with hypoxia: Secondary | ICD-10-CM | POA: Diagnosis not present

## 2019-11-14 DIAGNOSIS — D631 Anemia in chronic kidney disease: Secondary | ICD-10-CM | POA: Diagnosis not present

## 2019-11-14 DIAGNOSIS — E1122 Type 2 diabetes mellitus with diabetic chronic kidney disease: Secondary | ICD-10-CM | POA: Diagnosis not present

## 2019-11-14 DIAGNOSIS — I5032 Chronic diastolic (congestive) heart failure: Secondary | ICD-10-CM | POA: Diagnosis not present

## 2019-11-14 DIAGNOSIS — I130 Hypertensive heart and chronic kidney disease with heart failure and stage 1 through stage 4 chronic kidney disease, or unspecified chronic kidney disease: Secondary | ICD-10-CM | POA: Diagnosis not present

## 2019-11-14 DIAGNOSIS — I495 Sick sinus syndrome: Secondary | ICD-10-CM | POA: Diagnosis not present

## 2019-11-19 ENCOUNTER — Inpatient Hospital Stay: Payer: Medicare HMO | Attending: Nurse Practitioner | Admitting: Nurse Practitioner

## 2019-11-19 ENCOUNTER — Encounter: Payer: Self-pay | Admitting: Nurse Practitioner

## 2019-11-19 DIAGNOSIS — F0390 Unspecified dementia without behavioral disturbance: Secondary | ICD-10-CM | POA: Diagnosis not present

## 2019-11-19 DIAGNOSIS — C185 Malignant neoplasm of splenic flexure: Secondary | ICD-10-CM | POA: Diagnosis not present

## 2019-11-19 DIAGNOSIS — D631 Anemia in chronic kidney disease: Secondary | ICD-10-CM | POA: Diagnosis not present

## 2019-11-19 DIAGNOSIS — E1122 Type 2 diabetes mellitus with diabetic chronic kidney disease: Secondary | ICD-10-CM | POA: Diagnosis not present

## 2019-11-19 DIAGNOSIS — I495 Sick sinus syndrome: Secondary | ICD-10-CM | POA: Diagnosis not present

## 2019-11-19 DIAGNOSIS — I4821 Permanent atrial fibrillation: Secondary | ICD-10-CM | POA: Diagnosis not present

## 2019-11-19 DIAGNOSIS — J9611 Chronic respiratory failure with hypoxia: Secondary | ICD-10-CM | POA: Diagnosis not present

## 2019-11-19 DIAGNOSIS — I5032 Chronic diastolic (congestive) heart failure: Secondary | ICD-10-CM | POA: Diagnosis not present

## 2019-11-19 DIAGNOSIS — N183 Chronic kidney disease, stage 3 unspecified: Secondary | ICD-10-CM | POA: Diagnosis not present

## 2019-11-19 DIAGNOSIS — I130 Hypertensive heart and chronic kidney disease with heart failure and stage 1 through stage 4 chronic kidney disease, or unspecified chronic kidney disease: Secondary | ICD-10-CM | POA: Diagnosis not present
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
## 2019-11-20 ENCOUNTER — Telehealth: Payer: Self-pay | Admitting: Nurse Practitioner

## 2019-11-20 NOTE — Telephone Encounter (Signed)
Scheduled appt per 1/6 los.  Sent a message to HIM pool to get a calendar mailed out. 

## 2019-11-21 ENCOUNTER — Encounter (HOSPITAL_COMMUNITY): Payer: Medicare HMO

## 2019-11-24 DIAGNOSIS — J9611 Chronic respiratory failure with hypoxia: Secondary | ICD-10-CM | POA: Diagnosis not present

## 2019-11-24 DIAGNOSIS — F0390 Unspecified dementia without behavioral disturbance: Secondary | ICD-10-CM | POA: Diagnosis not present

## 2019-11-24 DIAGNOSIS — I5032 Chronic diastolic (congestive) heart failure: Secondary | ICD-10-CM | POA: Diagnosis not present

## 2019-11-24 DIAGNOSIS — D631 Anemia in chronic kidney disease: Secondary | ICD-10-CM | POA: Diagnosis not present

## 2019-11-24 DIAGNOSIS — I4821 Permanent atrial fibrillation: Secondary | ICD-10-CM | POA: Diagnosis not present

## 2019-11-24 DIAGNOSIS — I130 Hypertensive heart and chronic kidney disease with heart failure and stage 1 through stage 4 chronic kidney disease, or unspecified chronic kidney disease: Secondary | ICD-10-CM | POA: Diagnosis not present

## 2019-11-24 DIAGNOSIS — I495 Sick sinus syndrome: Secondary | ICD-10-CM | POA: Diagnosis not present

## 2019-11-24 DIAGNOSIS — N183 Chronic kidney disease, stage 3 unspecified: Secondary | ICD-10-CM | POA: Diagnosis not present

## 2019-11-24 DIAGNOSIS — E1122 Type 2 diabetes mellitus with diabetic chronic kidney disease: Secondary | ICD-10-CM | POA: Diagnosis not present

## 2019-12-02 ENCOUNTER — Other Ambulatory Visit: Payer: Self-pay | Admitting: Pharmacist

## 2019-12-02 ENCOUNTER — Ambulatory Visit: Payer: Self-pay | Admitting: Pharmacist

## 2019-12-02 DIAGNOSIS — I495 Sick sinus syndrome: Secondary | ICD-10-CM | POA: Diagnosis not present

## 2019-12-02 DIAGNOSIS — N183 Chronic kidney disease, stage 3 unspecified: Secondary | ICD-10-CM | POA: Diagnosis not present

## 2019-12-02 DIAGNOSIS — D631 Anemia in chronic kidney disease: Secondary | ICD-10-CM | POA: Diagnosis not present

## 2019-12-02 DIAGNOSIS — E1122 Type 2 diabetes mellitus with diabetic chronic kidney disease: Secondary | ICD-10-CM | POA: Diagnosis not present

## 2019-12-02 DIAGNOSIS — F0390 Unspecified dementia without behavioral disturbance: Secondary | ICD-10-CM | POA: Diagnosis not present

## 2019-12-02 DIAGNOSIS — I130 Hypertensive heart and chronic kidney disease with heart failure and stage 1 through stage 4 chronic kidney disease, or unspecified chronic kidney disease: Secondary | ICD-10-CM | POA: Diagnosis not present

## 2019-12-02 DIAGNOSIS — I5032 Chronic diastolic (congestive) heart failure: Secondary | ICD-10-CM | POA: Diagnosis not present

## 2019-12-02 DIAGNOSIS — J9611 Chronic respiratory failure with hypoxia: Secondary | ICD-10-CM | POA: Diagnosis not present

## 2019-12-02 DIAGNOSIS — I4821 Permanent atrial fibrillation: Secondary | ICD-10-CM | POA: Diagnosis not present

## 2019-12-02 NOTE — Patient Outreach (Signed)
[facility] [facility]) Care Management  12/02/2019  Ruqayyah Corbit [facility] 1946-08-03 [id number]   Patient was called regarding medication assistance and review. Unfortunately, she did not answer the phone. HIPAA compliant message was left on her voicemail.  Plan: Call patient back in 4-6 weeks. Send unsuccessful contact letter.  [name], PharmD, [place] Clinical Pharmacist [phone]

## 2019-12-03 ENCOUNTER — Other Ambulatory Visit: Payer: Self-pay | Admitting: Pharmacy Technician

## 2019-12-03 NOTE — Patient Outreach (Signed)
[facility]) Care Management  12/03/2019  Cassandra Allen [facility] 10-21-1946 [id number]   Incoming call received from Dr. [name] office in regards to Cassandra Allen application for Levemir and Victoza.  Nurse for Dr. Stephanie Acre was inquiring what the patient needed to do in regards to submitting a renewal application. Informed the nurse that our pharmacist tried to outreach patient yesterday to start the process but she did not answer the phone. Informed her that the pharmacist also left a message. As of today, the call had not been returned. Provided the nurse with the pharmacist name and phone number that the patient needs to call back so the process can be started. Nurse verbalized understanding.  Elroy Schembri P. Dashawn Bartnick, [place] Management [phone]

## 2019-12-10 ENCOUNTER — Other Ambulatory Visit: Payer: Self-pay | Admitting: Pharmacist

## 2019-12-11 ENCOUNTER — Other Ambulatory Visit: Payer: Self-pay | Admitting: Pharmacy Technician

## 2019-12-11 NOTE — Patient Outreach (Signed)
[facility]) Care Management  [name]   12/11/2019  Cassandra Allen [birth year] [id number]  Reason for referral: medication assistance  Current insurance:[employer]  HPI:  Patient is a 74 year old female with multiple medical conditions including but not limited to:  Colon cancer, atrial fibrillation, CHF, CKD stage 3, depression, hypertension, hyperlipidemia, and type 2 diabetes. Patient wondered about getting re-certified to receive Levemir and Victoza through [place] Patient Assistance Program.  Objective: Allergies  Allergen Reactions  . Metformin And Related Nausea And Vomiting  . Prednisone Nausea And Vomiting    Medications Reviewed Today    Reviewed by [name], [facility] (Pharmacist) on 12/10/19 at 1735  Med List Status: <None>  Medication Order Taking? Sig Documenting Provider Last Dose Status Informant  amLODipine (NORVASC) 5 MG tablet [id number]  Take 1 tablet by mouth daily. [provider]  Expired 11/19/19 2359   ferrous sulfate (KP FERROUS SULFATE) 325 (65 FE) MG tablet [id number] Yes Take 1 tablet (325 mg total) by mouth daily with breakfast. Modena Jansky, MD Taking Active   HYDROcodone-acetaminophen (NORCO) 7.5-325 MG tablet [id number] Yes Take 1 tablet by mouth every 6 (six) hours as needed for pain. [provider] Taking Active   Insulin Detemir (LEVEMIR) 100 UNIT/ML Pen [id number] Yes Inject 16 Units into the skin daily. Modena Jansky, MD Taking Active   liraglutide (VICTOZA) 18 MG/3ML SOPN [id number] Yes Inject 0.3 mLs (1.8 mg total) into the skin every morning. Modena Jansky, MD Taking Active   metoprolol tartrate (LOPRESSOR) 25 MG tablet [id number] Yes Take 1 tablet by mouth daily. [provider] Taking Active   ondansetron (ZOFRAN) 8 MG tablet [id number] Yes Take 1 tablet by mouth every 8 (eight) hours as needed for nausea. [provider] Taking Active   oxybutynin (DITROPAN) 5 MG  tablet [id number] No Take 1 tablet by mouth daily. [provider] Unknown Active            Med Note Arnette Schaumann Dec 10, 2019  5:35 PM) Both oxybutynin 57m and oxybutynin xl 120mwere filled within 2 weeks of each other and patient is unsure which one she is taking.  oxybutynin (DITROPAN-XL) 10 MG 24 hr tablet [id number] Take 1 tablet (10 mg total) by mouth daily. HoModena JanskyMD Unknown Active   pantoprazole (PROTONIX) 40 MG tablet [id number] 1 tablet (40 mg total) by mouth daily for 30 days. HoModena JanskyMD  Expired 04/19/19 2359   sertraline (ZOLOFT) 100 MG tablet [id number] Take 1 tablet (100 mg total) by mouth daily. TAKE 1 TABLET BY MOUTH ONCE DAILY (TAKE WITH 25MG TO=125MG) FOR DEPRESSION  Patient taking differently: Take 100 mg by mouth daily. Taking 12512maily   Hongalgi, AnaLenis DickinsonD Taking Active   simvastatin (ZOCOR) 10 MG tablet [id number] Take 1 tablet (10 mg total) by mouth daily at 6 PM.  Patient taking differently: Take 20 mg by mouth daily at 6 PM.    Hongalgi, AnaLenis DickinsonD Taking Active   traMADol (ULTRAM) 50 MG tablet [id number] Take 1 tablet (50 mg total) by mouth every 6 (six) hours as needed for moderate pain or severe pain. HonModena JanskyD Taking Active   Med List Note (BaLuciano CutterPhT 01/31/19 1258): [facility] [phone]       Assessment:  Medication Review Findings:  . Both Oxybutynin 5mg50m  and Oxybutynin Xl 29m were filled in close proximity to each other. Patient was not sure which she was actually taking because she said her son and daughter-in-law keep her medication bottles and only give her each dose.   . HgA1c 7.6% 06/28/2019 o On statin-simvastatin . Increased risk of CNS depression with tramadol/oxybutynin/Hydrocodone-APAP  Medication Assistance Findings:  Medication assistance needs identified: Levemir and Victoza   Patient is over-income for LIS/Extra Help Patient said  she had enough Levemir for a few weeks but needed a refill on Victoza.  Patient's pharmacy was called.  Victoza was out of refills. Pharmacy was asked to send a refill request to the patient's PCP.    OGlass blower/designerfrom Dr. WMyer Peeroffice left a message on my voicemail wondering about the status of the patient's medication assistance process.  A message updating her on the status was left on her voicemail as well as a request for a refill to be sent for the Victoza to the patient's pharmacy.  Once filled, the Victoza should have a $45/$47 copay unless the patient has not met her deductible.   Either way, it was explained to the patient that the medication assistance process can take anywhere from 4-8 weeks and that she may have to purchase her medications until she is approved and her medications are shipped from the drug manufacturer.      Additional medication assistance options reviewed with patient as warranted:  No other options identified  Plan: I will route patient assistance letter to TArtemustechnician who will coordinate patient assistance program application process for medications listed above.  [facility] will assist with obtaining all required documents from both patient and provider(s) and submit application(s) once completed.    Route note to PCP  Follow up with patient in 1 week on refill of Victoza.  KElayne Guerin PharmD, BMahtowaClinical Pharmacist [phone]

## 2019-12-11 NOTE — Patient Outreach (Signed)
[facility]) Care Management  12/11/2019  Cassandra Allen [facility] 04-May-1946 [id number]                                       Medication Assistance Referral  Referral From: [place] [facility]  Medication/Company: Levemir and Victoza / [employer] Patient application portion:  Education officer, museum portion: Faxed  to Dr. [name] on 12/12/2019 Provider address/fax verified via: Office website   Follow up:  Will follow up with patient in 5-15 business days to confirm application(s) have been received.  Lonell Stamos P. Nicolet Griffy, [place] Management [phone]

## 2019-12-12 DIAGNOSIS — I4821 Permanent atrial fibrillation: Secondary | ICD-10-CM | POA: Diagnosis not present

## 2019-12-12 DIAGNOSIS — N183 Chronic kidney disease, stage 3 unspecified: Secondary | ICD-10-CM | POA: Diagnosis not present

## 2019-12-12 DIAGNOSIS — I130 Hypertensive heart and chronic kidney disease with heart failure and stage 1 through stage 4 chronic kidney disease, or unspecified chronic kidney disease: Secondary | ICD-10-CM | POA: Diagnosis not present

## 2019-12-12 DIAGNOSIS — I495 Sick sinus syndrome: Secondary | ICD-10-CM | POA: Diagnosis not present

## 2019-12-12 DIAGNOSIS — I5032 Chronic diastolic (congestive) heart failure: Secondary | ICD-10-CM | POA: Diagnosis not present

## 2019-12-12 DIAGNOSIS — D631 Anemia in chronic kidney disease: Secondary | ICD-10-CM | POA: Diagnosis not present

## 2019-12-12 DIAGNOSIS — R5381 Other malaise: Secondary | ICD-10-CM | POA: Diagnosis not present

## 2019-12-12 DIAGNOSIS — J9611 Chronic respiratory failure with hypoxia: Secondary | ICD-10-CM | POA: Diagnosis not present

## 2019-12-12 DIAGNOSIS — F0390 Unspecified dementia without behavioral disturbance: Secondary | ICD-10-CM | POA: Diagnosis not present

## 2019-12-12 DIAGNOSIS — E1122 Type 2 diabetes mellitus with diabetic chronic kidney disease: Secondary | ICD-10-CM | POA: Diagnosis not present

## 2019-12-17 ENCOUNTER — Other Ambulatory Visit: Payer: Self-pay

## 2019-12-17 ENCOUNTER — Ambulatory Visit (HOSPITAL_COMMUNITY)
Admission: RE | Admit: 2019-12-17 | Discharge: 2019-12-17 | Disposition: A | Discharge status: Home / Self Care | Payer: Medicare HMO | Source: Ambulatory Visit | Attending: Nurse Practitioner | Admitting: Nurse Practitioner

## 2019-12-17 DIAGNOSIS — R6889 Other general symptoms and signs: Secondary | ICD-10-CM | POA: Diagnosis not present

## 2019-12-17 DIAGNOSIS — Z9049 Acquired absence of other specified parts of digestive tract: Secondary | ICD-10-CM | POA: Insufficient documentation

## 2019-12-17 DIAGNOSIS — K769 Liver disease, unspecified: Secondary | ICD-10-CM | POA: Insufficient documentation

## 2019-12-17 DIAGNOSIS — C185 Malignant neoplasm of splenic flexure: Secondary | ICD-10-CM | POA: Insufficient documentation

## 2019-12-17 DIAGNOSIS — E279 Disorder of adrenal gland, unspecified: Secondary | ICD-10-CM | POA: Insufficient documentation

## 2019-12-17 DIAGNOSIS — Z79899 Other long term (current) drug therapy: Secondary | ICD-10-CM | POA: Diagnosis not present

## 2019-12-17 DIAGNOSIS — C189 Malignant neoplasm of colon, unspecified: Secondary | ICD-10-CM | POA: Diagnosis not present

## 2019-12-17 DIAGNOSIS — C19 Malignant neoplasm of rectosigmoid junction: Secondary | ICD-10-CM | POA: Diagnosis not present

## 2019-12-17 LAB — GLUCOSE, CAPILLARY
Glucose-Capillary: 64 mg/dL — ABNORMAL LOW (ref 70–99)
Glucose-Capillary: 74 mg/dL (ref 70–99)

## 2019-12-17 MED ORDER — FLUDEOXYGLUCOSE F - 18 (FDG) INJECTION
7.9000 | Freq: Once | INTRAVENOUS | Status: AC
  Administered 2019-12-17: 7.9 via INTRAVENOUS

## 2019-12-17 NOTE — Progress Notes (Signed)
[place]   Telephone:(336) [phone] [phone]   Clinic Follow up Note   Patient Care Team: [name], MD as PCP - General (Family Medicine) Thompson Grayer, MD as PCP - Cardiology (Cardiology) Thompson Grayer, MD as PCP - Electrophysiology (Cardiology) Ronnette Juniper, MD as Consulting Physician (Gastroenterology) Truitt Merle, MD as Consulting Physician (Medical Oncology) Newt Minion, MD as Consulting Physician (Orthopedic Surgery) Leighton Ruff, MD as Consulting Physician (Colon and Rectal Surgery) [name], [facility] as [place] Management (Pharmacist) [name], [facility] as [place] Management (Pharmacist) [place], Luiz Ochoa, CPhT as [employer] banker, corporate)  Date of Service:  12/24/2019  CHIEF COMPLAINT: F/u of colon cancer and IDA  SUMMARY OF ONCOLOGIC HISTORY: Oncology History Overview Note  Cancer Staging Cancer of splenic flexure of colon Staging form: Colon and Rectum, AJCC 8th Edition - Clinical stage from 10/25/2018: Stage Unknown (cTX, cN0, cM1) - Signed by Truitt Merle, MD on 11/22/2018 - Pathologic stage from 12/27/2018: pT3, pN0, cM1 - Signed by Truitt Merle, MD on 05/02/2019    Cancer of splenic flexure of colon  10/25/2018 Procedure   10/25/2018 Colonoscopy Impression: -Two 4 to 6 mm sessile polyps were found in the sigmoid colon. Resected and retrieved. -A 6 mm sessile polyp was found in the descending colon. Resected and retrieved. -Three 5 to 15 mm sessile polyps were found in the transverse colon. Resection and retrieval were complete. -A 12 mm sessile polyp was found in the cecum. Resection and retrieval were complete.  -A 20 mm sessile polyp was found in the ascending colon. Resection and retrieval were complete. . -A malignancy partially obstructing large mass was found at 55 cm proximal to the anus. Biopsied and tattooed. -Malignant partially obstructing tumor  20 cm proximal to the anus. Biopsied and tattooed. -Diverticulosis in the sigmoid colon, in the descending colon and in the transverse colon.    10/25/2018 Imaging   10/25/2018 Endoscopy Impression: -Normal esophagus -Z-line regular 35 cm from the incisors. -Non-bleeding erosive gastropathy -Erythematous mucosa in the antrum. Biopsied. -Normal examined duodenum. Biopsied.   10/25/2018 Cancer Staging   Staging form: Colon and Rectum, AJCC 8th Edition - Clinical stage from 10/25/2018: Stage Unknown (cTX, cN0, cM1) - Signed by Truitt Merle, MD on 11/22/2018   10/30/2018 Initial Diagnosis   Cancer of left colon ([place])   10/30/2018 Initial Biopsy   Final Diagnosis: 10/30/18 1. Small intestine-Duedenum, Biospy:   Benign 2.Stomach-Antrum, Biospy:   Chronic inactive Gastritis 3. Large intestine-Sigmoid Colon, Polyp:   Tubular adenomas (2) 4. Large interstine-Descending Colon, Polyp:   Tubular adenoma with high grade dysplasia, suspicious for invasion.  5. Large intestine-transverse colon, Polyp:   Tubulovilous Adenomas (3).  6. Large Intestine-Cecum, Polp:   Tubular adenoma  7. Large Intestine-Ascending Colon, Polyp:  Tubulovilous Adenoma 8. Large intestine, Biopsy, Mass 55cm:  Invasive moderately differentiated adenocarcinoma.  9. Large intestine, biopsy, Mass 20cm:   Tubulovilous Adenoma   11/01/2018 Imaging   CT CAP W contrast 11/01/18  IMPRESSION: CT CHEST: 1. Right paratracheal adenopathy (immediately superior to the azygos vein) which short axis dimension of 1.2 cm. In the present clinical setting it is possible this is related to metastatic involvement. 2. Scattered pulmonary parenchymal changes none of which are highly suspicious for metastatic disease. 3. Abnormal appearance of the thyroid gland with left lower lobe ill-defined mass spanning over 3 cm. Thyroid ultrasound can be performed for further delineation. 4. Cardiomegaly. Coronary artery  calcification. 5.   Aortic Atherosclerosis (ICD10-I70.0).  CT ABDOMEN PELVIS: 1. Numerous hepatic lesions suspicious for metastatic disease largest within the caudate lobe measuring up to 3.2 cm. 2. Gallbladder wall thickening. This may be related to increased right heart pressure or liver disease but could not exclude cholecystitis in the proper clinical setting. 3. Numerous splenic lesions which in the present clinical setting is suspicious for combination of metastatic disease and splenic cysts. 4. 3.2 cm left adrenal mass suspicious for metastatic disease. 5. Question sigmoid colon and possibly proximal descending colon mass. Rectosigmoid colon mass also not excluded. Correlation with colonoscopy results recommended. 6. Prominent number of colonic diverticula. Third spacing of fluid makes it difficult to evaluate for the possibility of diverticulitis. Radiopaque 1.5 cm structure within the right colon may be related to ingested foreign body. 7. Low-density fatty appearing structures in the external iliac region/pelvic sidewall bilaterally with adjacent low-density iliac lymph nodes and retroperitoneal lymph nodes raises possibility of low-density metastatic adenopathy. Prominent size portacaval lymph node which short axis dimension of 1.3 cm. PET-CT could be obtained for further delineation if clinically desired. 8. Gas within the urinary bladder may be related to recent manipulation. Clinical correlation recommended. 9. Left adnexal 2.4 cm cyst. This can be assessed with pelvic sonogram.   12/04/2018 - 12/18/2018 Chemotherapy   FOLFOX every 2 weeks starting 12/04/18. Stopped after cycle 2 on 12/18/18 due to SBO which resulted in left sigmoid colectomy. Unfortunately after surgery she developed post-op complications. She had an abcessed that required draininh in 02/2019.       12/07/2018 Imaging   MRI Abdomen 12/07/18  IMPRESSION: 1. The overall improvement with resolution of the number of the hepatic  metastatic lesions. The dominant lesion in the caudate lobe is reduced in size from previous 4.0 by 3.3 cm to current 3.8 by 2.7 cm. 2. The splenic lesions are similar to prior and nonspecific. 3. 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 There is a cystic component laterally which is probably incidental rather than from a collision lesion. 4. Hepatic hemochromatosis. 5. 7 mm gallstone in the common bile duct compatible with choledocholithiasis. There also multiple gallstones in the gallbladder. 6.  Aortic Atherosclerosis (ICD10-I70.0). 7. Bosniak category 2 cyst in the right kidney upper pole.   12/27/2018 Surgery   LEFT SIGMOID COLECTOMY WITH HARTMANN POUCH AND END COLOSTOMY by Dr Hassell Done 12/27/18    12/27/2018 Pathology Results   Diagnosis Colon, segmental resection for tumor, distal transverse, descending, sigmoid - INVASIVE MODERATELY DIFFERENTIATED ADENOCARCINOMA, 5.0 CM, CIRCUMFERENTIALLY INVOLVING THE PROXIMAL DESCENDING COLON WITH ASSOCIATED LUMINAL OBSTRUCTION. SEE NOTE. - CARCINOMA INVADES INTO THE PERICOLONIC SOFT TISSUE. - RESECTION MARGINS ARE NEGATIVE FOR CARCINOMA. - NEGATIVE FOR LYMPHOVASCULAR OR PERINEURAL INVASION. - TWENTY-TWO BENIGN LYMPH NODES, NEGATIVE FOR CARCINOMA (0/22). - SEPARATE LARGE VILLOUS ADENOMA INVOLVING SIGMOID COLON, 5.0 CM, WITHOUT HIGH GRADE DYSPLASIA OR CARCINOMA. - NON-SPECIFIC CHANGES IN THE PROXIMAL COLON, CONSISTENT WITH DISTAL OBSTRUCTION. - SEE ONCOLOGY TABLE.   12/27/2018 Cancer Staging   Staging form: Colon and Rectum, AJCC 8th Edition - Pathologic stage from 12/27/2018: pT3, pN0, cM1 - Signed by Truitt Merle, MD on 05/02/2019   06/17/2019 Imaging   IMPRESSION: 1. Decrease in overall size of the left anterior abscess cavity containing the percutaneous drain. There remains an abscess cavity measuring 5.7 cm in greatest diameter and containing air and fluid. 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 2. Multiple  ill-defined low-density lesions in the liver are again very concerning for metastatic disease. The 4 cm caudate lesion is the most suspicious for a metastatic lesion. Eventual correlation with MRI of the abdomen may be helpful to more accurately assess the extent of metastatic disease. 3. Gradual increase in the amount of free fluid in the peritoneal cavity with slight increase in perihepatic fluid since the prior study but clear gradual increase since prior scans in the last several months. Although there is no clear evidence of progressive carcinomatosis, the persistence and increased prominence of free fluid is concerning for potential malignant ascites. 4. Stable mildly prominent lymphadenopathy in the retroperitoneum, bilateral iliac chains and periportal/portacaval nodal stations.     09/16/2019 Imaging   IMPRESSION: 1. Well-positioned drainage catheter with interval resolution of anterior peritoneal abscess. No undrained isolated collection identified. 2. Slight interval decrease in the volume of peritoneal ascites. 3. Stable central hepatic mass without significant interval change. Lesion remains concerning for metastatic disease. 4. Stable splenic metastases. 5. Stable left adrenal lesion previously characterized as an adenoma. 6. Additional ancillary findings as above without significant interval change.   12/17/2019 PET scan     IMPRESSION: 1. Hypermetabolic expansile lesion in the caudate lobe of liver consistent with hepatic metastasis. 2. No additional evidence soft tissue metastasis or nodal metastasis. 3. Low metabolic activity associated with low-density LEFT adrenal lesions favored complex benign adenoma. 4. Post colectomy without evidence of local recurrence. No bowel obstruction. 5. Intense activity associated along the RIGHT femoral neck suggests bursitis.        CURRENT THERAPY:   Surveillance  INTERVAL HISTORY:  WELCOME SIRAK is here for a follow up. She had right eye redness but this is not bothering her. She notes she is doing fairly well. She is able to still do everything she needs to at home adequately including ADLs and cook and clean her own room. She notes she still lives with her son and daughter-in-law. She notes having pain around her colostomy bag. She has been taking Norco once tab if needed for a few days. Her tramadol dow not help this pain. She notes she has been eating well. She denies chest, discomfort or headaches. She plans to have colostomy reversal next week. She notes she has been paying on her hospital bills.    REVIEW OF SYSTEMS:   Constitutional: Denies fevers, chills or abnormal weight loss Eyes: Denies blurriness of vision Ears, nose, mouth, throat, and face: Denies mucositis or sore throat Respiratory: Denies cough, dyspnea or wheezes Cardiovascular: Denies palpitation, chest discomfort or lower extremity swelling Gastrointestinal:  Denies nausea, heartburn or change in bowel habits Skin: Denies abnormal skin rashes Lymphatics: Denies new lymphadenopathy or easy bruising Neurological:Denies numbness, tingling or new weaknesses Behavioral/Psych: Mood is stable, no new changes  All other systems were reviewed with the patient and are negative.  MEDICAL HISTORY:  Past Medical History:  Diagnosis Date  . Acute diastolic heart failure ([place]) 12/22/2018  . Arthritis   . Atrial fibrillation, chronic ([place]) 12/22/2018  . Cancer of left colon ([place]) 10/30/2018  . Cancer of sigmoid colon  12/27/2018  . Diabetes mellitus without complication ([place])   . Hypertension   . Obesity (BMI 30-39.9) 12/27/2018    SURGICAL HISTORY: Past Surgical History:  Procedure Laterality Date  . COLONOSCOPY  10/2018   Dr [name].  Large cancer at splenic flexure,  Bulky sigmoid colon mass, Numerous polyps  . Intra-abdominal abscess drainage  01/31/2019    Abscess drainage grew  Enterococcus faecalis  . IR CATHETER TUBE CHANGE  06/05/2019  . IR IMAGING GUIDED PORT INSERTION  11/20/2018  . IR RADIOLOGIST EVAL & MGMT  06/04/2019  . IR RADIOLOGIST EVAL & MGMT  06/17/2019  . IR RADIOLOGIST EVAL & MGMT  09/16/2019  . LAPAROTOMY N/A 12/27/2018   Procedure: LEFT SIGMOID COLECTOMY WITH HARTMANN POUCH AND END COLOSTOMY;  Surgeon: [name], MD;  Location: [facility];  Service: General;  Laterality: N/A;    I have reviewed the social history and family history with the patient and they are unchanged from previous note.  ALLERGIES:  is allergic to metformin and related and prednisone.  MEDICATIONS:  Current Outpatient Medications  Medication Sig Dispense Refill  . amLODipine (NORVASC) 5 MG tablet Take 1 tablet by mouth daily.    . ferrous sulfate (KP FERROUS SULFATE) 325 (65 FE) MG tablet Take 1 tablet (325 mg total) by mouth daily with breakfast. 30 tablet 0  . furosemide (LASIX) 40 MG tablet Take 40 mg by mouth as directed.    Marland Kitchen HYDROcodone-acetaminophen (NORCO) 7.5-325 MG tablet Take 1 tablet by mouth every 6 (six) hours as needed for pain.    . Insulin Detemir (LEVEMIR) 100 UNIT/ML Pen Inject 16 Units into the skin daily. 15 mL 0  . liraglutide (VICTOZA) 18 MG/3ML SOPN Inject 0.3 mLs (1.8 mg total) into the skin every morning. 5 pen 0  . metoprolol tartrate (LOPRESSOR) 25 MG tablet Take 1 tablet by mouth daily.    . ondansetron (ZOFRAN) 8 MG tablet Take 1 tablet by mouth every 8 (eight) hours as needed for nausea.    Marland Kitchen oxybutynin (DITROPAN) 5 MG tablet Take 1 tablet by mouth daily.    Marland Kitchen oxybutynin (DITROPAN-XL) 10 MG 24 hr tablet Take 1 tablet (10 mg total) by mouth daily. 30 tablet 0  . pantoprazole (PROTONIX) 40 MG tablet Take 1 tablet (40 mg total) by mouth daily for 30 days. 30 tablet 0  . sertraline (ZOLOFT) 100 MG tablet Take 1 tablet (100 mg total) by mouth daily. TAKE 1 TABLET BY MOUTH ONCE DAILY (TAKE WITH 25MG  TO=125MG ) FOR DEPRESSION (Patient  taking differently: Take 100 mg by mouth daily. Taking 125mg  daily) 30 tablet 0  . simvastatin (ZOCOR) 10 MG tablet Take 1 tablet (10 mg total) by mouth daily at 6 PM. (Patient taking differently: Take 20 mg by mouth daily at 6 PM. ) 30 tablet 0  . traMADol (ULTRAM) 50 MG tablet Take 1 tablet (50 mg total) by mouth every 6 (six) hours as needed for moderate pain or severe pain. 20 tablet 0   No current facility-administered medications for this visit.    PHYSICAL EXAMINATION: ECOG PERFORMANCE STATUS: 2 - Symptomatic, <50% confined to bed  Vitals:   12/24/19 1452  BP: (!) 136/54  Pulse: 64  Resp: 17  Temp: 98.2 F (36.8 C)  SpO2: 99%   Filed Weights   12/24/19 1452  Weight: 148 lb 6.4 oz (67.3 kg)    GENERAL:alert, no distress and comfortable SKIN: skin color, texture, turgor are normal, no rashes or significant lesions (+) Right arm subcutaneous cyst or hematoma.  EYES: normal, Conjunctiva are pink and non-injected, sclera clear  NECK: supple, thyroid normal size, non-tender, without nodularity LYMPH:  no palpable lymphadenopathy in the cervical, axillary  LUNGS: clear to auscultation and percussion with normal breathing effort HEART: regular rate & rhythm and no murmurs and no lower extremity edema ABDOMEN:abdomen soft, non-tender and normal bowel sounds (+) Ostomy bag  in place.  Musculoskeletal:no cyanosis of digits and no clubbing  NEURO: alert & oriented x 3 with fluent speech, no focal motor/sensory deficits  LABORATORY DATA:  I have reviewed the data as listed CBC Latest Ref Rng & Units 12/24/2019 11/05/2019 09/18/2019  WBC 4.0 - 10.5 K/uL 6.2 7.8 6.0  Hemoglobin 12.0 - 15.0 g/dL 10.9(L) 11.2(L) 10.1(L)  Hematocrit 36.0 - 46.0 % 35.5(L) 36.4 32.6(L)  Platelets 150 - 400 K/uL 199 218 185     CMP Latest Ref Rng & Units 11/05/2019 09/18/2019 03/18/2019  Glucose 70 - 99 mg/dL 86 96 190(H)  BUN 8 - 23 mg/dL 39(H) 47(H) 17  Creatinine 0.44 - 1.00 mg/dL 1.23(H) 1.69(H) 0.84   Sodium 135 - 145 mmol/L 144 142 137  Potassium 3.5 - 5.1 mmol/L 4.4 4.1 3.9  Chloride 98 - 111 mmol/L 108 104 96(L)  CO2 22 - 32 mmol/L 23 25 32  Calcium 8.9 - 10.3 mg/dL 9.1 8.8(L) 8.8(L)  Total Protein 6.5 - 8.1 g/dL 7.2 6.9 -  Total Bilirubin 0.3 - 1.2 mg/dL 0.5 0.3 -  Alkaline Phos 38 - 126 U/L 70 65 -  AST 15 - 41 U/L 13(L) 9(L) -  ALT 0 - 44 U/L 7 6 -      RADIOGRAPHIC STUDIES: I have personally reviewed the radiological images as listed and agreed with the findings in the report. No results found.   ASSESSMENT & PLAN:  Cassandra Allen is a 74 y.o. female with   1.Left colon Cancer,G1,pT3N0cMx with probable liver metastasis, MSS  -She was diagnosed withInvasive moderately differentiated adenocarcinomaof left colon in 10/2018.She presented with iron deficient anemia -Her CT CAP from 11/01/18 showed several hepatic lesions suspicious for metastasis. Her subsequent liver biopsy from 11/20/18 was negativefor malignancy.I spoke with Dr. Jacelyn Grip didthebiopsy,her liver lesion was not very clear on the ultrasound, and was in a difficult spot for biopsy. -She started FOLFOX on 12/04/18.Chemo was stopped after cycle 2 due tobowel obstruction from her primary colon tumorwhich required urgentleft sigmoid colectomy on 12/27/18.  -Her tumor was removed completely, she had negative margins and node negative.Liver biopsy was not feasible during her colon surgery -Unfortunately after surgery she developed post-op complications. She had anabscessthat required draining in 02/2019 along with frequent hospitalization for recurrent fall, Afib etc. She has recovered well since hospitalization.  -I personally reviewed and discussed her PET from 12/17/19 with pt which shows enlarging hypermetabolic  lesion in the caudate liver lobe consistent with hepatic metastasis. No additional hypermetabolic nodal or distant metastasis.  -I discussed biopsy would be definitve diagnosis. However it  may still be challenge due to the location -I reviewed the natural history of metastatic colon cancer.  Discussed the option of systemic chemotherapy, liver resection, radiation or radioembolization.  Due to her previous complicated course after hemicolectomy, I do not know if she is a good candidate for liver resection.  Given her advanced age, medical comorbidities, overall poor performance status, she is not a good candidate for intensive chemotherapy.  We can certainly try single agent such as Xeloda.  I encouraged her to consider liver radiation.  -She does plan to have colostomy reversal this month. I will discuss this with Dr. Hassell Done.  -She would like to think about biopsy and treatment as options with her son. I encouraged her to call me next week.    2. Severe anemia, iron deficientanemia and anemia of chronic disease -she was on IV iron sucrose 200mg as needed. Previously had a severe reaction  to [facility]. BM biopsy was negative for MDS or malignancy. -Her 11/2018 MRI shows iron deposits in her liver, will give IV iron more sparingly to prevent iron overload. -Resolved after hemicolectomy until iron studies on 11/05/19 showed serum iron 33 and 16% transferrin saturation. TIBC 205, ferritin 197.Her last IV Venofer was 11/29/18. -She is currently on oral iron once daily, she could not tolerate BID.   3. HTN, AF -Continue medications and f/u with PCP. BO well controlled -She ison Eliquis -Will monitor closely while on chemotherapy. I encouraged her to monitor at home as well. -HTN has been mildly elevated lately.   4. DM, obesity -Currently on insulin and Lirraglutide. -f/u with PCP and monitor BG at home  5. Social and Acupuncturist  -If she cannot drive herself, her only transportation is her son who works often.  -If she needs help I will refer her SW to aid with transportationas needed.  -She plans to move in with her son in the near future.  -She is not  currentlyworkingand is out on short term disability.  -Since her frequent hospitalization in 2020 she has a lot of medical bills to pay. I encouraged her to contact her financial advocate Lenise about possible aid.    6.Goal of care discussion  -The patient understands the goal of care is palliativeif she does have metastatic cancer. -she is full code now  7. Frequent fall, large right forearm hematoma -She reports having a recurrent fall since f/u in 10/2019, no injury. Vertigo contributes  -uses a walker -has an aide who helps. I strongly support escalating her care at home. Patient states she cannot afford it. SW has been asked to assist  -Stable on exam today, no longer causes pain (12/23/18)   Plan -PET scan reviewed, oligo live metastasis  -she will think about the above treatment options -GI tumor board discussion, I will reach out to Dr. Hassell Done also  -will call her next week  -I called her son and discussed the above with him, he will talk to her over the weekend    No problem-specific Assessment & Plan notes found for this encounter.   No orders of the defined types were placed in this encounter.  All questions were answered. The patient knows to call the clinic with any problems, questions or concerns. No barriers to learning was detected. The total time spent in the appointment was 40 minutes.     Truitt Merle, MD 12/24/2019   I, Joslyn Devon, am acting as scribe for Truitt Merle, MD.   I have reviewed the above documentation for accuracy and completeness, and I agree with the above.

## 2019-12-19 ENCOUNTER — Other Ambulatory Visit: Payer: Self-pay | Admitting: Pharmacist

## 2019-12-19 ENCOUNTER — Ambulatory Visit: Payer: Self-pay | Admitting: Pharmacist

## 2019-12-19 NOTE — Patient Outreach (Signed)
[facility]) Care Management  12/19/2019  Cassandra Allen [facility] [birth year] [id number]   Patient was called regarding medication assistance with Victoza. Unfortunately, she did not answer the phone.   Patient assistance forms were mailed to the patient and faxed to her provider's office. Patient said she did not have enough Victoza to last her until the patient assistance process was complete. Her provider's office was contacted and asked to send a new prescription to the patient's pharmacy.  Pharmacy fill history data was reviewed and Victoza was filled 12/11/2019 for a 90 day supply.  Plan: Susy Frizzle will follow during the patient assistance process. Follow up with the patient in 3 months.  [name], PharmD, [place] Clinical Pharmacist [phone]

## 2019-12-24 ENCOUNTER — Inpatient Hospital Stay: Payer: Medicare HMO

## 2019-12-24 ENCOUNTER — Other Ambulatory Visit: Payer: Self-pay

## 2019-12-24 ENCOUNTER — Encounter: Payer: Self-pay | Admitting: Hematology

## 2019-12-24 ENCOUNTER — Inpatient Hospital Stay: Payer: Medicare HMO | Attending: Nurse Practitioner | Admitting: Hematology

## 2019-12-24 ENCOUNTER — Other Ambulatory Visit: Payer: Self-pay | Admitting: Pharmacy Technician

## 2019-12-24 VITALS — BP 136/54 | HR 64 | Temp 98.2°F | Resp 17 | Ht 62.0 in | Wt 148.4 lb

## 2019-12-24 DIAGNOSIS — C185 Malignant neoplasm of splenic flexure: Secondary | ICD-10-CM | POA: Diagnosis not present

## 2019-12-24 DIAGNOSIS — D120 Benign neoplasm of cecum: Secondary | ICD-10-CM | POA: Insufficient documentation

## 2019-12-24 DIAGNOSIS — D500 Iron deficiency anemia secondary to blood loss (chronic): Secondary | ICD-10-CM

## 2019-12-24 DIAGNOSIS — R590 Localized enlarged lymph nodes: Secondary | ICD-10-CM | POA: Insufficient documentation

## 2019-12-24 DIAGNOSIS — D509 Iron deficiency anemia, unspecified: Secondary | ICD-10-CM | POA: Insufficient documentation

## 2019-12-24 DIAGNOSIS — Z95828 Presence of other vascular implants and grafts: Secondary | ICD-10-CM

## 2019-12-24 DIAGNOSIS — I2510 Atherosclerotic heart disease of native coronary artery without angina pectoris: Secondary | ICD-10-CM | POA: Diagnosis not present

## 2019-12-24 DIAGNOSIS — I700 Atherosclerosis of aorta: Secondary | ICD-10-CM | POA: Insufficient documentation

## 2019-12-24 DIAGNOSIS — E669 Obesity, unspecified: Secondary | ICD-10-CM | POA: Insufficient documentation

## 2019-12-24 DIAGNOSIS — Z9049 Acquired absence of other specified parts of digestive tract: Secondary | ICD-10-CM | POA: Insufficient documentation

## 2019-12-24 DIAGNOSIS — R42 Dizziness and giddiness: Secondary | ICD-10-CM | POA: Diagnosis not present

## 2019-12-24 DIAGNOSIS — Z933 Colostomy status: Secondary | ICD-10-CM | POA: Insufficient documentation

## 2019-12-24 DIAGNOSIS — R160 Hepatomegaly, not elsewhere classified: Secondary | ICD-10-CM | POA: Diagnosis not present

## 2019-12-24 DIAGNOSIS — S5011XA Contusion of right forearm, initial encounter: Secondary | ICD-10-CM | POA: Insufficient documentation

## 2019-12-24 DIAGNOSIS — H5789 Other specified disorders of eye and adnexa: Secondary | ICD-10-CM | POA: Diagnosis not present

## 2019-12-24 DIAGNOSIS — D124 Benign neoplasm of descending colon: Secondary | ICD-10-CM | POA: Diagnosis not present

## 2019-12-24 DIAGNOSIS — Z794 Long term (current) use of insulin: Secondary | ICD-10-CM | POA: Diagnosis not present

## 2019-12-24 DIAGNOSIS — E119 Type 2 diabetes mellitus without complications: Secondary | ICD-10-CM | POA: Insufficient documentation

## 2019-12-24 DIAGNOSIS — C7889 Secondary malignant neoplasm of other digestive organs: Secondary | ICD-10-CM | POA: Insufficient documentation

## 2019-12-24 DIAGNOSIS — C188 Malignant neoplasm of overlapping sites of colon: Secondary | ICD-10-CM | POA: Insufficient documentation

## 2019-12-24 DIAGNOSIS — Z86010 Personal history of colonic polyps: Secondary | ICD-10-CM | POA: Insufficient documentation

## 2019-12-24 DIAGNOSIS — Z79899 Other long term (current) drug therapy: Secondary | ICD-10-CM | POA: Diagnosis not present

## 2019-12-24 DIAGNOSIS — I5032 Chronic diastolic (congestive) heart failure: Secondary | ICD-10-CM | POA: Diagnosis not present

## 2019-12-24 DIAGNOSIS — Z888 Allergy status to other drugs, medicaments and biological substances status: Secondary | ICD-10-CM | POA: Diagnosis not present

## 2019-12-24 DIAGNOSIS — I482 Chronic atrial fibrillation, unspecified: Secondary | ICD-10-CM | POA: Insufficient documentation

## 2019-12-24 DIAGNOSIS — R296 Repeated falls: Secondary | ICD-10-CM | POA: Diagnosis not present

## 2019-12-24 DIAGNOSIS — M1990 Unspecified osteoarthritis, unspecified site: Secondary | ICD-10-CM | POA: Insufficient documentation

## 2019-12-24 DIAGNOSIS — D122 Benign neoplasm of ascending colon: Secondary | ICD-10-CM | POA: Insufficient documentation

## 2019-12-24 DIAGNOSIS — R6889 Other general symptoms and signs: Secondary | ICD-10-CM | POA: Diagnosis not present

## 2019-12-24 DIAGNOSIS — I110 Hypertensive heart disease with heart failure: Secondary | ICD-10-CM | POA: Diagnosis not present

## 2019-12-24 LAB — CBC WITH DIFFERENTIAL (CANCER CENTER ONLY)
Abs Immature Granulocytes: 0.01 10*3/uL (ref 0.00–0.07)
Basophils Absolute: 0.1 10*3/uL (ref 0.0–0.1)
Basophils Relative: 1 %
Eosinophils Absolute: 0.1 10*3/uL (ref 0.0–0.5)
Eosinophils Relative: 2 %
HCT: 35.5 % — ABNORMAL LOW (ref 36.0–46.0)
Hemoglobin: 10.9 g/dL — ABNORMAL LOW (ref 12.0–15.0)
Immature Granulocytes: 0 %
Lymphocytes Relative: 13 %
Lymphs Abs: 0.8 10*3/uL (ref 0.7–4.0)
MCH: 29.1 pg (ref 26.0–34.0)
MCHC: 30.7 g/dL (ref 30.0–36.0)
MCV: 94.7 fL (ref 80.0–100.0)
Monocytes Absolute: 0.6 10*3/uL (ref 0.1–1.0)
Monocytes Relative: 10 %
Neutro Abs: 4.6 10*3/uL (ref 1.7–7.7)
Neutrophils Relative %: 74 %
Platelet Count: 199 10*3/uL (ref 150–400)
RBC: 3.75 MIL/uL — ABNORMAL LOW (ref 3.87–5.11)
RDW: 15.2 % (ref 11.5–15.5)
WBC Count: 6.2 10*3/uL (ref 4.0–10.5)
nRBC: 0 % (ref 0.0–0.2)

## 2019-12-24 LAB — IRON AND TIBC
Iron: 72 ug/dL (ref 41–142)
Saturation Ratios: 29 % (ref 21–57)
TIBC: 248 ug/dL (ref 236–444)
UIBC: 175 ug/dL (ref 120–384)

## 2019-12-24 LAB — FERRITIN: Ferritin: 211 ng/mL (ref 11–307)

## 2019-12-24 MED ORDER — HEPARIN SOD (PORK) LOCK FLUSH 100 UNIT/ML IV SOLN
500.0000 [IU] | Freq: Once | INTRAVENOUS | Status: AC
  Administered 2019-12-24: 500 [IU]
  Filled 2019-12-24: qty 5

## 2019-12-24 MED ORDER — SODIUM CHLORIDE 0.9% FLUSH
10.0000 mL | Freq: Once | INTRAVENOUS | Status: AC
  Administered 2019-12-24: 10 mL
  Filled 2019-12-24: qty 10

## 2019-12-24 NOTE — Patient Instructions (Signed)

## 2019-12-24 NOTE — Patient Outreach (Signed)
[facility]) Care Management  12/24/2019  [name] [name] Aug 17, 1946 [id number]   Unsuccessful call placed to patient regarding patient assistance application(s) for Levemir and Victoza with [employer] , HIPAA compliant voicemail left.   Was calling patient to inquire if she has received the applications that were mailed to her on 12/12/2019.  Follow up:  Will follow up with patient in 5-7 business days if call is not returned.  Analea Muller P. Mikail Goostree, [place] Management [phone]

## 2019-12-25 ENCOUNTER — Telehealth: Payer: Self-pay | Admitting: Hematology

## 2019-12-25 ENCOUNTER — Other Ambulatory Visit: Payer: Self-pay | Admitting: Pharmacy Technician

## 2019-12-25 ENCOUNTER — Telehealth: Payer: Self-pay

## 2019-12-25 LAB — CMP (CANCER CENTER ONLY)
ALT: 8 U/L (ref 0–44)
AST: 9 U/L — ABNORMAL LOW (ref 15–41)
Albumin: 3.9 g/dL (ref 3.5–5.0)
Alkaline Phosphatase: 67 U/L (ref 38–126)
Anion gap: 11 (ref 5–15)
BUN: 40 mg/dL — ABNORMAL HIGH (ref 8–23)
CO2: 28 mmol/L (ref 22–32)
Calcium: 8.7 mg/dL — ABNORMAL LOW (ref 8.9–10.3)
Chloride: 108 mmol/L (ref 98–111)
Creatinine: 1.11 mg/dL — ABNORMAL HIGH (ref 0.44–1.00)
GFR, Est AFR Am: 57 mL/min — ABNORMAL LOW (ref >60)
GFR, Estimated: 49 mL/min — ABNORMAL LOW (ref >60)
Glucose, Bld: 99 mg/dL (ref 70–99)
Potassium: 4.1 mmol/L (ref 3.5–5.1)
Sodium: 147 mmol/L — ABNORMAL HIGH (ref 135–145)
Total Bilirubin: 0.5 mg/dL (ref 0.3–1.2)
Total Protein: 6.4 g/dL — ABNORMAL LOW (ref 6.5–8.1)

## 2019-12-25 NOTE — Patient Outreach (Signed)
[facility]) Care Management  12/25/2019  [name] 22-Feb-1946 [id number]     Successful call placed to patient regarding patient assistance application(s) for Levemir and Victoza  with [employer], HIPAA identifiers verified.   Patient informed she has received the application. Denies having any questions about the application. Patient informs she will place the application in the mail either this weekend or sometime next week.    Follow up:  Will route note to [place] for case closure if document(s) have not been received in the next 15 business days.  Dvon Jiles P. Keyston Ardolino, [place] Management [phone]

## 2019-12-25 NOTE — Telephone Encounter (Signed)
Per 2/10 los F/u to be determined

## 2019-12-25 NOTE — Addendum Note (Signed)
Addended by: Truitt Merle on: 12/25/2019 01:57 PM   Modules accepted: Orders

## 2019-12-25 NOTE — Telephone Encounter (Signed)
TC to pt per Cira Rue NP to let her know iron studies are normal, and to continue oral iron.

## 2019-12-26 ENCOUNTER — Telehealth: Payer: Self-pay

## 2019-12-26 NOTE — Telephone Encounter (Signed)
I left a vm telling Cassandra Allen that Dr. Burr Medico has ordered a MRI for her.  I asked that she call the office for further discussion.

## 2020-01-01 ENCOUNTER — Telehealth: Payer: Self-pay

## 2020-01-01 NOTE — Telephone Encounter (Signed)
Called to let patient know she is scheduled for an MRI on 01/12/20 at 10:00 and needs to arrive to the appointment no later than 09:30. Asked her to please call office back to let us know she got the message and is able to make the appointment. Will f/u with patient next week if no confirmation call is received.

## 2020-01-02 ENCOUNTER — Telehealth: Payer: Self-pay | Admitting: *Deleted

## 2020-01-02 NOTE — Telephone Encounter (Signed)
Called pt regarding MRI schedule.  She states she doesn't like mondays & early ams.  Called [facility] & they will call her to r/s.

## 2020-01-09 DIAGNOSIS — Z933 Colostomy status: Secondary | ICD-10-CM | POA: Diagnosis not present

## 2020-01-11 DIAGNOSIS — R5381 Other malaise: Secondary | ICD-10-CM | POA: Diagnosis not present

## 2020-01-12 ENCOUNTER — Other Ambulatory Visit (HOSPITAL_COMMUNITY): Payer: Medicare HMO

## 2020-01-14 NOTE — Progress Notes (Signed)
Cassandra Allen   [phone] [phone]   Clinic Follow up Note   Patient Care Team: [name], MD as PCP - General (Family Medicine) Thompson Grayer, MD as PCP - Cardiology (Cardiology) Thompson Grayer, MD as PCP - Electrophysiology (Cardiology) Ronnette Juniper, MD as Consulting Physician (Gastroenterology) Truitt Merle, MD as Consulting Physician (Medical Oncology) Newt Minion, MD as Consulting Physician (Orthopedic Surgery) Leighton Ruff, MD as Consulting Physician (Colon and Rectal Surgery) [name], [facility] as [place] Management (Pharmacist) [name], [facility] as [place] Management (Pharmacist)   I connected with Cassandra Allen on 01/21/2020 at  9:00 AM EST by telephone visit and verified that I am speaking with the correct person using two identifiers.  I discussed the limitations, risks, security and privacy concerns of performing an evaluation and management service by telephone and the availability of in person appointments. I also discussed with the patient that there may be a patient responsible charge related to this service. The patient expressed understanding and agreed to proceed.   Patient's location:  Her home Provider's location:  My Office   CHIEF COMPLAINT: F/u of colon cancer and IDA  SUMMARY OF ONCOLOGIC HISTORY: Oncology History Overview Note  Cancer Staging Cancer of splenic flexure of colon Staging form: Colon and Rectum, AJCC 8th Edition - Clinical stage from 10/25/2018: Stage Unknown (cTX, cN0, cM1) - Signed by Truitt Merle, MD on 11/22/2018 - Pathologic stage from 12/27/2018: pT3, pN0, cM1 - Signed by Truitt Merle, MD on 05/02/2019    Cancer of splenic flexure of colon  10/25/2018 Procedure   10/25/2018 Colonoscopy Impression: -Two 4 to 6 mm sessile polyps were found in the sigmoid colon. Resected and retrieved. -A 6 mm sessile polyp was found in the descending colon. Resected  and retrieved. -Three 5 to 15 mm sessile polyps were found in the transverse colon. Resection and retrieval were complete. -A 12 mm sessile polyp was found in the cecum. Resection and retrieval were complete.  -A 20 mm sessile polyp was found in the ascending colon. Resection and retrieval were complete. . -A malignancy partially obstructing large mass was found at 55 cm proximal to the anus. Biopsied and tattooed. -Malignant partially obstructing tumor 20 cm proximal to the anus. Biopsied and tattooed. -Diverticulosis in the sigmoid colon, in the descending colon and in the transverse colon.    10/25/2018 Imaging   10/25/2018 Endoscopy Impression: -Normal esophagus -Z-line regular 35 cm from the incisors. -Non-bleeding erosive gastropathy -Erythematous mucosa in the antrum. Biopsied. -Normal examined duodenum. Biopsied.   10/25/2018 Cancer Staging   Staging form: Colon and Rectum, AJCC 8th Edition - Clinical stage from 10/25/2018: Stage Unknown (cTX, cN0, cM1) - Signed by Truitt Merle, MD on 11/22/2018   10/30/2018 Initial Diagnosis   Cancer of left colon ([place])   10/30/2018 Initial Biopsy   Final Diagnosis: 10/30/18 1. Small intestine-Duedenum, Biospy:   Benign 2.Stomach-Antrum, Biospy:   Chronic inactive Gastritis 3. Large intestine-Sigmoid Colon, Polyp:   Tubular adenomas (2) 4. Large interstine-Descending Colon, Polyp:   Tubular adenoma with high grade dysplasia, suspicious for invasion.  5. Large intestine-transverse colon, Polyp:   Tubulovilous Adenomas (3).  6. Large Intestine-Cecum, Polp:   Tubular adenoma  7. Large Intestine-Ascending Colon, Polyp:  Tubulovilous Adenoma 8. Large intestine, Biopsy, Mass 55cm:  Invasive moderately differentiated adenocarcinoma.  9. Large intestine, biopsy, Mass 20cm:   Tubulovilous Adenoma   11/01/2018 Imaging   CT CAP W contrast 11/01/18  IMPRESSION: CT CHEST: 1. Right paratracheal adenopathy (immediately superior to the  azygos vein) which short axis dimension of 1.2 cm. In the present clinical setting it is possible this is related to metastatic involvement. 2. Scattered pulmonary parenchymal changes none of which are highly suspicious for metastatic disease. 3. Abnormal appearance of the thyroid gland with left lower lobe ill-defined mass spanning over 3 cm. Thyroid ultrasound can be performed for further delineation. 4. Cardiomegaly. Coronary artery calcification. 5.  Aortic Atherosclerosis (ICD10-I70.0).  CT ABDOMEN PELVIS: 1. Numerous hepatic lesions suspicious for metastatic disease largest within the caudate lobe measuring up to 3.2 cm. 2. Gallbladder wall thickening. This may be related to increased right heart pressure or liver disease but could not exclude cholecystitis in the proper clinical setting. 3. Numerous splenic lesions which in the present clinical setting is suspicious for combination of metastatic disease and splenic cysts. 4. 3.2 cm left adrenal mass suspicious for metastatic disease. 5. Question sigmoid colon and possibly proximal descending colon mass. Rectosigmoid colon mass also not excluded. Correlation with colonoscopy results recommended. 6. Prominent number of colonic diverticula. Third spacing of fluid makes it difficult to evaluate for the possibility of diverticulitis. Radiopaque 1.5 cm structure within the right colon may be related to ingested foreign body. 7. Low-density fatty appearing structures in the external iliac region/pelvic sidewall bilaterally with adjacent low-density iliac lymph nodes and retroperitoneal lymph nodes raises possibility of low-density metastatic adenopathy. Prominent size portacaval lymph node which short axis dimension of 1.3 cm. PET-CT could be obtained for further delineation if clinically desired. 8. Gas within the urinary bladder may be related to recent manipulation. Clinical correlation recommended. 9. Left adnexal 2.4 cm cyst.  This can be assessed with pelvic sonogram.   12/04/2018 - 12/18/2018 Chemotherapy   FOLFOX every 2 weeks starting 12/04/18. Stopped after cycle 2 on 12/18/18 due to SBO which resulted in left sigmoid colectomy. Unfortunately after surgery she developed post-op complications. She had an abcessed that required draininh in 02/2019.       12/07/2018 Imaging   MRI Abdomen 12/07/18  IMPRESSION: 1. The overall improvement with resolution of the number of the hepatic metastatic lesions. The dominant lesion in the caudate lobe is reduced in size from previous 4.0 by 3.3 cm to current 3.8 by 2.7 cm. 2. The splenic lesions are similar to prior and nonspecific. 3. 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 There is a cystic component laterally which is probably incidental rather than from a collision lesion. 4. Hepatic hemochromatosis. 5. 7 mm gallstone in the common bile duct compatible with choledocholithiasis. There also multiple gallstones in the gallbladder. 6.  Aortic Atherosclerosis (ICD10-I70.0). 7. Bosniak category 2 cyst in the right kidney upper pole.   12/27/2018 Surgery   LEFT SIGMOID COLECTOMY WITH HARTMANN POUCH AND END COLOSTOMY by Dr Hassell Done 12/27/18    12/27/2018 Pathology Results   Diagnosis Colon, segmental resection for tumor, distal transverse, descending, sigmoid - INVASIVE MODERATELY DIFFERENTIATED ADENOCARCINOMA, 5.0 CM, CIRCUMFERENTIALLY INVOLVING THE PROXIMAL DESCENDING COLON WITH ASSOCIATED LUMINAL OBSTRUCTION. SEE NOTE. - CARCINOMA INVADES INTO THE PERICOLONIC SOFT TISSUE. - RESECTION MARGINS ARE NEGATIVE FOR CARCINOMA. - NEGATIVE FOR LYMPHOVASCULAR OR PERINEURAL INVASION. - TWENTY-TWO BENIGN LYMPH NODES, NEGATIVE FOR CARCINOMA (0/22). - SEPARATE LARGE VILLOUS ADENOMA INVOLVING SIGMOID COLON, 5.0 CM, WITHOUT HIGH GRADE DYSPLASIA OR CARCINOMA. - NON-SPECIFIC CHANGES IN THE PROXIMAL COLON, CONSISTENT WITH DISTAL OBSTRUCTION. - SEE ONCOLOGY TABLE.   12/27/2018  Cancer Staging   Staging form: Colon and Rectum, AJCC  8th Edition - Pathologic stage from 12/27/2018: pT3, pN0, cM1 - Signed by Truitt Merle, MD on 05/02/2019   06/17/2019 Imaging   IMPRESSION: 1. Decrease in overall size of the left anterior abscess cavity containing the percutaneous drain. There remains an abscess cavity measuring 5.7 cm in greatest diameter and containing air and fluid. 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 2. Multiple ill-defined low-density lesions in the liver are again very concerning for metastatic disease. The 4 cm caudate lesion is the most suspicious for a metastatic lesion. Eventual correlation with MRI of the abdomen may be helpful to more accurately assess the extent of metastatic disease. 3. Gradual increase in the amount of free fluid in the peritoneal cavity with slight increase in perihepatic fluid since the prior study but clear gradual increase since prior scans in the last several months. Although there is no clear evidence of progressive carcinomatosis, the persistence and increased prominence of free fluid is concerning for potential malignant ascites. 4. Stable mildly prominent lymphadenopathy in the retroperitoneum, bilateral iliac chains and periportal/portacaval nodal stations.     09/16/2019 Imaging   IMPRESSION: 1. Well-positioned drainage catheter with interval resolution of anterior peritoneal abscess. No undrained isolated collection identified. 2. Slight interval decrease in the volume of peritoneal ascites. 3. Stable central hepatic mass without significant interval change. Lesion remains concerning for metastatic disease. 4. Stable splenic metastases. 5. Stable left adrenal lesion previously characterized as an adenoma. 6. Additional ancillary findings as above without significant interval change.   12/17/2019 PET scan     IMPRESSION: 1. Hypermetabolic  expansile lesion in the caudate lobe of liver consistent with hepatic metastasis. 2. No additional evidence soft tissue metastasis or nodal metastasis. 3. Low metabolic activity associated with low-density LEFT adrenal lesions favored complex benign adenoma. 4. Post colectomy without evidence of local recurrence. No bowel obstruction. 5. Intense activity associated along the RIGHT femoral neck suggests bursitis.     01/20/2020 Imaging   MRI Abdomen    IMPRESSION: 1. Caudate metastasis has enlarged since 2019 but is similar based on comparison CT evaluation from September 16, 2019 no new or progressive disease is identified. 2. Very subtle area of susceptibility along the anterior liver margin may represent an additional site of disease but given the appearance of previous imaging studies this may represent a treated area of disease, particularly when comparing the study to 06/17/2019. 3. Stable multifocal splenic lesions. 4. Cholelithiasis. 5. Signs of partial colectomy with right lower quadrant colostomy. 6. Mild amorphous enhancement along erector spinae musculature on the left is of uncertain significance perhaps related to mild posttraumatic changes, atrophy or mild myositis. Correlate with any symptoms in this area or recent trauma and consider short interval follow-up for further assessment.        CURRENT THERAPY:  Cancer Surveillance and oral iron once daily   INTERVAL HISTORY:  Cassandra Allen is here for a follow up of colon cancer and IDA. She is interested in proceeding with biopsy and she will consider her treatment options with her family.     REVIEW OF SYSTEMS:   Constitutional: Denies fevers, chills or abnormal weight loss Eyes: Denies blurriness of vision Ears, nose, mouth, throat, and face: Denies mucositis or sore throat Respiratory: Denies cough, dyspnea or wheezes Cardiovascular: Denies palpitation, chest discomfort or lower extremity  swelling Gastrointestinal:  Denies nausea, heartburn or change in bowel habits Skin: Denies abnormal skin rashes Lymphatics: Denies  new lymphadenopathy or easy bruising Neurological:Denies numbness, tingling or new weaknesses Behavioral/Psych: Mood is stable, no new changes  All other systems were reviewed with the patient and are negative.  MEDICAL HISTORY:  Past Medical History:  Diagnosis Date  . Acute diastolic heart failure ([place]) 12/22/2018  . Arthritis   . Atrial fibrillation, chronic ([place]) 12/22/2018  . Cancer of left colon ([place]) 10/30/2018  . Cancer of sigmoid colon  12/27/2018  . Diabetes mellitus without complication ([place])   . Hypertension   . Obesity (BMI 30-39.9) 12/27/2018    SURGICAL HISTORY: Past Surgical History:  Procedure Laterality Date  . COLONOSCOPY  10/2018   Dr [name].  Large cancer at splenic flexure,  Bulky sigmoid colon mass, Numerous polyps  . Intra-abdominal abscess drainage  01/31/2019   Abscess drainage grew Enterococcus faecalis  . IR CATHETER TUBE CHANGE  06/05/2019  . IR IMAGING GUIDED PORT INSERTION  11/20/2018  . IR RADIOLOGIST EVAL & MGMT  06/04/2019  . IR RADIOLOGIST EVAL & MGMT  06/17/2019  . IR RADIOLOGIST EVAL & MGMT  09/16/2019  . LAPAROTOMY N/A 12/27/2018   Procedure: LEFT SIGMOID COLECTOMY WITH HARTMANN POUCH AND END COLOSTOMY;  Surgeon: [name], MD;  Location: [facility];  Service: General;  Laterality: N/A;    I have reviewed the social history and family history with the patient and they are unchanged from previous note.  ALLERGIES:  is allergic to metformin and related and prednisone.  MEDICATIONS:  Current Outpatient Medications  Medication Sig Dispense Refill  . amLODipine (NORVASC) 5 MG tablet Take 1 tablet by mouth daily.    . ferrous sulfate (KP FERROUS SULFATE) 325 (65 FE) MG tablet Take 1 tablet (325 mg total) by mouth daily with breakfast. 30 tablet 0  . furosemide (LASIX) 40 MG tablet Take 40 mg by mouth as directed.    Marland Kitchen  HYDROcodone-acetaminophen (NORCO) 7.5-325 MG tablet Take 1 tablet by mouth every 6 (six) hours as needed for pain.    . Insulin Detemir (LEVEMIR) 100 UNIT/ML Pen Inject 16 Units into the skin daily. 15 mL 0  . liraglutide (VICTOZA) 18 MG/3ML SOPN Inject 0.3 mLs (1.8 mg total) into the skin every morning. 5 pen 0  . metoprolol tartrate (LOPRESSOR) 25 MG tablet Take 1 tablet by mouth daily.    . ondansetron (ZOFRAN) 8 MG tablet Take 1 tablet by mouth every 8 (eight) hours as needed for nausea.    Marland Kitchen oxybutynin (DITROPAN) 5 MG tablet Take 1 tablet by mouth daily.    Marland Kitchen oxybutynin (DITROPAN-XL) 10 MG 24 hr tablet Take 1 tablet (10 mg total) by mouth daily. 30 tablet 0  . pantoprazole (PROTONIX) 40 MG tablet Take 1 tablet (40 mg total) by mouth daily for 30 days. 30 tablet 0  . sertraline (ZOLOFT) 100 MG tablet Take 1 tablet (100 mg total) by mouth daily. TAKE 1 TABLET BY MOUTH ONCE DAILY (TAKE WITH 25MG  TO=125MG ) FOR DEPRESSION (Patient taking differently: Take 100 mg by mouth daily. Taking 125mg  daily) 30 tablet 0  . simvastatin (ZOCOR) 10 MG tablet Take 1 tablet (10 mg total) by mouth daily at 6 PM. (Patient taking differently: Take 20 mg by mouth daily at 6 PM. ) 30 tablet 0  . traMADol (ULTRAM) 50 MG tablet Take 1 tablet (50 mg total) by mouth every 6 (six) hours as needed for moderate pain or severe pain. 20 tablet 0   No current facility-administered medications for this visit.    PHYSICAL EXAMINATION: ECOG  PERFORMANCE STATUS: 2 - Symptomatic, <50% confined to bed  No vitals taken today, Exam not performed today   LABORATORY DATA:  I have reviewed the data as listed CBC Latest Ref Rng & Units 12/24/2019 11/05/2019 09/18/2019  WBC 4.0 - 10.5 K/uL 6.2 7.8 6.0  Hemoglobin 12.0 - 15.0 g/dL 10.9(L) 11.2(L) 10.1(L)  Hematocrit 36.0 - 46.0 % 35.5(L) 36.4 32.6(L)  Platelets 150 - 400 K/uL 199 218 185     CMP Latest Ref Rng & Units 12/24/2019 11/05/2019 09/18/2019  Glucose 70 - 99 mg/dL 99 86 96    BUN 8 - 23 mg/dL 40(H) 39(H) 47(H)  Creatinine 0.44 - 1.00 mg/dL 1.11(H) 1.23(H) 1.69(H)  Sodium 135 - 145 mmol/L 147(H) 144 142  Potassium 3.5 - 5.1 mmol/L 4.1 4.4 4.1  Chloride 98 - 111 mmol/L 108 108 104  CO2 22 - 32 mmol/L 28 23 25   Calcium 8.9 - 10.3 mg/dL 8.7(L) 9.1 8.8(L)  Total Protein 6.5 - 8.1 g/dL 6.4(L) 7.2 6.9  Total Bilirubin 0.3 - 1.2 mg/dL 0.5 0.5 0.3  Alkaline Phos 38 - 126 U/L 67 70 65  AST 15 - 41 U/L 9(L) 13(L) 9(L)  ALT 0 - 44 U/L 8 7 6       RADIOGRAPHIC STUDIES: I have personally reviewed the radiological images as listed and agreed with the findings in the report. MR Abdomen W Wo Contrast  Result Date: 01/20/2020 CLINICAL DATA:  Colorectal carcinoma with liver lesions. EXAM: MRI ABDOMEN WITHOUT AND WITH CONTRAST TECHNIQUE: Multiplanar multisequence MR imaging of the abdomen was performed both before and after the administration of intravenous contrast. CONTRAST:  84mL GADAVIST GADOBUTROL 1 MMOL/ML IV SOLN COMPARISON:  Multiple prior studies most recent MRI comparison from January of 2020. Recent PET exam comparison from 12/17/2019 FINDINGS: Lower chest: His incidental imaging of the lung bases is unremarkable, limited assessment on MRI. Is No signs of effusion. Hepatobiliary: Caudate metastasis with partially cystic features has enlarged since 2019 but is similar based on comparison with recent CT evaluation from September 16, 2019. This area measures approximately 5.1 x 3.5 cm and shows some susceptibility artifact at the margin. Background hepatic iron deposition is improved. There was profound iron deposition on the previous study. Single area of susceptibility artifact along the anterior margin liver measuring 8 mm (image 35, series 9) small cyst in the anterior left hepatic lobe. Fine detail in the liver with limited assessment due to respiratory motion. Portal vein is patent. Hepatic veins are patent. No biliary ductal dilation. Cholelithiasis. Pancreas: Normal  appearance of the pancreas, no sign of peripancreatic inflammation or significant ductal dilation. Spleen:  Spleen with numerous lesions that are unchanged since 2019. Stable size. Adrenals/Urinary Tract: Adrenal lesion measuring approximately 3 by 2.8 cm showing signal loss with the exception of a small area in the lateral aspect with similar appearance to prior exams Kidneys with small cysts on the right and scarring on the left. No signs of hydronephrosis. Stomach/Bowel: No acute bowel process. Signs of partial colectomy with right lower quadrant colostomy. Bowel is incompletely imaged and study not protocol for bowel evaluation. Vascular/Lymphatic: Patent abdominal vasculature. Signs of atherosclerotic change throughout the abdominal aorta. No signs of adenopathy. Other:  None. Musculoskeletal: No acute bone finding or destructive bone process to the extent visualized. There is amorphous enhancement in the left erector spinae musculature of the corresponds to very mild nonspecific uptake on the FDG PET scan. IMPRESSION: 1. Caudate metastasis has enlarged since 2019 but is similar based on  comparison CT evaluation from September 16, 2019 no new or progressive disease is identified. 2. Very subtle area of susceptibility along the anterior liver margin may represent an additional site of disease but given the appearance of previous imaging studies this may represent a treated area of disease, particularly when comparing the study to 06/17/2019. 3. Stable multifocal splenic lesions. 4. Cholelithiasis. 5. Signs of partial colectomy with right lower quadrant colostomy. 6. Mild amorphous enhancement along erector spinae musculature on the left is of uncertain significance perhaps related to mild posttraumatic changes, atrophy or mild myositis. Correlate with any symptoms in this area or recent trauma and consider short interval follow-up for further assessment. Electronically Signed   By: Zetta Bills M.D.   On:  01/20/2020 13:36     ASSESSMENT & PLAN:  Cassandra Allen is a 74 y.o. female with    1.Left colon Cancer,G1,pT3N0cMx with probable oligo liver metastasis, MSS -She was diagnosed withInvasive moderately differentiated adenocarcinomaof left colon in 10/2018.She presented with iron deficient anemia -Her staging CT CAP from 11/01/18 showed several hepatic lesions suspicious for metastasis. Her subsequent liver biopsy from 11/20/18 was negativefor malignancy.I spoke with Dr. Jacelyn Grip didthebiopsy,her liver lesion was not very clear on the ultrasound, and was in a difficult spot for biopsy. -She started FOLFOX on 12/04/18.Chemo was stopped after cycle 2 due tobowel obstruction from her primary colon tumorwhich required urgentleft sigmoid colectomy on 12/27/18.  -Hertumor was removed completely, she had negative margins and node negative.Liver biopsy was not feasible during her colon surgery. -Unfortunately after surgery she developed post-op complications. She had anabscessthat required drainingin 02/2019 along with frequent hospitalization for recurrent fall, Afib etc. She has recovered well since hospitalization.  -Her PET from 12/17/19 shows enlarging hypermetabolic  lesion in the caudate liver lobe consistent with hepatic metastasis. No additional hypermetabolic nodal or distant metastasis.  -I discussed her MRI abdomen from 01/20/20 with GI Tumor Board.  Her hypermetabolic lesion in caudate lobe has been there since late 2019, biopsy at that time was negative.  It has slightly enlarged since a year ago, but overall stable in the past 4 months.  Tumor board recommends rebiopsy for tissue diagnosis.  Dr. Ardis Hughs will try EUS biopsy to get more definitive diagnosis. I discussed this with the patient, she is agreeable.  -If she does have liver metastasis, I discussed surgical resection of tumor, Target radiation to tumor or Target Y90 procedure. I reviewed risk, side effects and benefits  of each option in great detail with her. The most curative path is surgery resection but it is technically a challenge due to the location, and his recovery will also be difficult for her. Local therapy is likely more tolerable but lower likelihood of curing it.  -I have referred her to see Dr. Barry Dienes, but she may not offer surgery. -I think radiation with SBRT is likely the most feasible treatment option for her, she is open to it, I will refer her to Dr. Lisbeth Renshaw. -We also discussed that the systemic chemotherapy is always option, but she is not a good candidate for intensive chemo due to her medical comorbidities, and poor performance status overall.   2. iron deficientanemia and anemia of chronic disease -she wason IV iron sucrose 200mg as needed. Previously had a severe reaction to Feraheme. BM biopsy was negative for MDS or malignancy. -Her 11/2018 MRI shows iron deposits in her liver, will give IV iron more sparingly to prevent iron overload. -Resolvedafterhemicolectomy until iron studies on 11/05/19 showed serum iron  33 and 16% transferrin saturation. TIBC 205, ferritin 197.Her last IV Venofer was 11/29/18. -She is currently on oral iron once daily, she could not tolerate BID.    3. HTN, AF -Continue medications and f/u with PCP. BO well controlled -She ison Eliquis -Will monitor closely while on chemotherapy. I encouraged her to monitor at home as well. -HTN has been mildly elevated lately.   4. DM, obesity -Currently on insulin and Lirraglutide. -f/u with PCP and monitor BG at home  5. Social and Acupuncturist  -If she cannot drive herself, her only transportation is her son who works often.  -If she needs help I will refer her SW to aid with transportationas needed.  -She plans to move in with her son in the near future.  -She is not currentlyworkingand is out on short term disability.  -Since her frequent hospitalization in 2020 she has a lot of medical  bills to pay. I encouraged her to contact her financial advocate Lenise about possible aid.    6.Goal of care discussion  -The patient understands the goal of care is palliativeif she does have metastatic cancer. -she is full code now  7.Frequent fall,large right forearm hematoma -She reports having a recurrent fall since f/u in 10/2019, no injury. Vertigo contributes  -uses a walker -has an aide who helps. I strongly support escalating her care at home. Patient states she cannot afford it. SW has been asked to assist -No longer causes pain   Plan -I reviewed MRI and tumor board recommendation with her  -EUS liver biopsy in 2-3 weeks by Dr. Ardis Hughs. I checked with her GI Dr. [name] and she agrees -she will see Dr. Barry Dienes  -rad/onc referral to discuss SBRT to her liver lesion  -F/u in 4 weeks    No problem-specific Assessment & Plan notes found for this encounter.   Orders Placed This Encounter  Procedures  . Ambulatory referral to Radiation Oncology    Referral Priority:   Routine    Referral Type:   Consultation    Referral Reason:   Specialty Services Required    Requested Specialty:   Radiation Oncology    Number of Visits Requested:   1   I discussed the assessment and treatment plan with the patient. The patient was provided an opportunity to ask questions and all were answered. The patient agreed with the plan and demonstrated an understanding of the instructions.  The patient was advised to call back or seek an in-person evaluation if the symptoms worsen or if the condition fails to improve as anticipated.  The total time spent in the appointment was 30 minutes.    Truitt Merle, MD 01/21/2020   I, Joslyn Devon, am acting as scribe for Truitt Merle, MD.   I have reviewed the above documentation for accuracy and completeness, and I agree with the above.

## 2020-01-15 ENCOUNTER — Ambulatory Visit (HOSPITAL_COMMUNITY): Payer: Medicare HMO

## 2020-01-20 ENCOUNTER — Ambulatory Visit (HOSPITAL_COMMUNITY)
Admission: RE | Admit: 2020-01-20 | Discharge: 2020-01-20 | Disposition: A | Discharge status: Home / Self Care | Payer: Medicare HMO | Source: Ambulatory Visit | Attending: Hematology | Admitting: Hematology

## 2020-01-20 ENCOUNTER — Ambulatory Visit: Payer: Self-pay | Admitting: Pharmacist

## 2020-01-20 ENCOUNTER — Telehealth: Payer: Self-pay

## 2020-01-20 ENCOUNTER — Other Ambulatory Visit: Payer: Self-pay

## 2020-01-20 DIAGNOSIS — C185 Malignant neoplasm of splenic flexure: Secondary | ICD-10-CM | POA: Diagnosis not present

## 2020-01-20 DIAGNOSIS — C19 Malignant neoplasm of rectosigmoid junction: Secondary | ICD-10-CM | POA: Diagnosis not present

## 2020-01-20 DIAGNOSIS — R6889 Other general symptoms and signs: Secondary | ICD-10-CM | POA: Diagnosis not present

## 2020-01-20 DIAGNOSIS — C787 Secondary malignant neoplasm of liver and intrahepatic bile duct: Secondary | ICD-10-CM | POA: Diagnosis not present

## 2020-01-20 MED ORDER — GADOBUTROL 1 MMOL/ML IV SOLN
7.0000 mL | Freq: Once | INTRAVENOUS | Status: AC | PRN
  Administered 2020-01-20: 12:00:00 7 mL via INTRAVENOUS

## 2020-01-20 NOTE — Telephone Encounter (Signed)
[facility] called with MRI results.  Report printed and placed on Dr. Ernestina Penna desk for review.

## 2020-01-21 ENCOUNTER — Encounter: Payer: Self-pay | Admitting: Hematology

## 2020-01-21 ENCOUNTER — Other Ambulatory Visit: Payer: Self-pay

## 2020-01-21 ENCOUNTER — Inpatient Hospital Stay: Payer: Medicare HMO | Attending: Nurse Practitioner | Admitting: Hematology

## 2020-01-21 DIAGNOSIS — C185 Malignant neoplasm of splenic flexure: Secondary | ICD-10-CM

## 2020-01-21 DIAGNOSIS — D500 Iron deficiency anemia secondary to blood loss (chronic): Secondary | ICD-10-CM

## 2020-01-22 ENCOUNTER — Other Ambulatory Visit: Payer: Self-pay

## 2020-01-22 ENCOUNTER — Telehealth: Payer: Self-pay

## 2020-01-22 ENCOUNTER — Telehealth: Payer: Self-pay | Admitting: Hematology

## 2020-01-22 DIAGNOSIS — C185 Malignant neoplasm of splenic flexure: Secondary | ICD-10-CM

## 2020-01-22 NOTE — Telephone Encounter (Signed)
Left message on machine to call back  

## 2020-01-22 NOTE — Telephone Encounter (Signed)
-----   Message from Milus Banister, MD sent at 01/22/2020  5:20 AM EST ----- Krista Blue, Happy to give this a try.  Mavis Gravelle, She needs first available apt with EUS with either myself or Gabe for caudate lobe mass, likely metastatic.  Gabe, GYI.  Not a straightforward location, FYI.  Thanks All  DJ  ----- Message ----- From: Truitt Merle, MD Sent: 01/21/2020   5:59 PM EST To: Jonnie Finner, RN, Milus Banister, MD, #  Dan,  I spoke with her, she is agreeable with EUS and liver biopsy. I check with her GI Dr. [name] and she said Dr. Paulita Fujita does not do EUS and liver biopsy. Could you get her in?   John,  I referred her to you, she wants to discuss SBRT with you.   Thanks   Krista Blue

## 2020-01-22 NOTE — Telephone Encounter (Signed)
Called patient to schedule appt per 3/10 los.  Patient stated she did not want to schedule her lab and f/u and that she is going to hold off on it for awhile.  I informed the MD, of what the patient said.

## 2020-01-22 NOTE — Telephone Encounter (Signed)
The pt has been scheduled for 02/26/20 at [facility] with Dr Ardis Hughs and COVID test on 02/23/20 at 925 am

## 2020-01-23 NOTE — Telephone Encounter (Signed)
Left message on machine to call back unable to reach pt by phone letter mailed and sent to My Chart

## 2020-01-27 ENCOUNTER — Telehealth: Payer: Self-pay | Admitting: *Deleted

## 2020-01-27 NOTE — Telephone Encounter (Signed)
EUS scheduled, pt instructed and medications reviewed.  Patient instructions mailed to home.  Patient to call with any questions or concerns.  

## 2020-01-27 NOTE — Telephone Encounter (Signed)
Attempted several times and left voicemail messages to reach patient to schedule consult with Dr. Lisbeth Renshaw.

## 2020-02-09 DIAGNOSIS — R5381 Other malaise: Secondary | ICD-10-CM | POA: Diagnosis not present

## 2020-02-17 ENCOUNTER — Ambulatory Visit: Admission: RE | Admit: 2020-02-17 | Payer: Medicare HMO | Source: Ambulatory Visit

## 2020-02-17 ENCOUNTER — Ambulatory Visit: Payer: Self-pay | Admitting: Pharmacist

## 2020-02-17 ENCOUNTER — Ambulatory Visit
Admission: RE | Admit: 2020-02-17 | Discharge: 2020-02-17 | Disposition: A | Discharge status: Home / Self Care | Payer: Medicare HMO | Source: Ambulatory Visit | Attending: Radiation Oncology | Admitting: Radiation Oncology

## 2020-02-17 ENCOUNTER — Telehealth: Payer: Self-pay

## 2020-02-17 ENCOUNTER — Other Ambulatory Visit: Payer: Self-pay | Admitting: Pharmacist

## 2020-02-17 NOTE — Telephone Encounter (Signed)
I left a vm for Cassandra Allen to cancel [name]'s appts to day with Dr. Lisbeth Renshaw and His nurse.

## 2020-02-17 NOTE — Telephone Encounter (Signed)
I spoke with [facility] endo and made them aware that fiducial markers will be needed.  Note also added to appt note

## 2020-02-17 NOTE — Telephone Encounter (Signed)
Ms Gaccione left vm stating she wanted to cancel her phen visits she has with rad onc today. I returned her call, she did not answer, I left a vm to return my call.

## 2020-02-17 NOTE — Telephone Encounter (Signed)
-----   Message from Milus Banister, MD sent at 02/17/2020 12:38 PM EDT ----- Regarding: RE: Chester Holstein, thanks  Seeley, Can you please alert [facility] that when she has her upcoming endoscopic ultrasound that I will likely be placing fiducial markers at the site as well.  Thanks ----- Message ----- From: Irving Copas., MD Sent: 02/17/2020   5:58 AM EDT To: Milus Banister, MD, # Subject: RE:                                            This is an interesting question. This has been done in the literature previously. In setting of what I have found it can be successful clinically with what seem to be a risk profile that is worth considering. I would recommend not placing within a cystic component as this lady's imaging has suggested and only place into a solid component.   Recommend antibiotics to decrease risk of cholangitis as well. Let me know how it goes if it is in a region safe for fiducial placement. GM ----- Message ----- From: Milus Banister, MD Sent: 02/15/2020   7:40 AM EDT To: Hayden Pedro, PA-C, #  [facility] be able to do this. Let me check with Gabe about placing fiducials in the liver.  Gabe, Seems like this should be as safe as placing fiducials in the pancreas, what do you think?  Thanks ----- Message ----- From: Hayden Pedro, PA-C Sent: 02/12/2020   2:41 PM EDT To: Milus Banister, MD, Timothy Lasso, RN  Hi Dr. Ardis Hughs,  When you do the EUS and biopsy of Ms. Poppe's caudate mass, could you also place fiducial markers for [place]?  [employer]

## 2020-02-17 NOTE — Patient Outreach (Addendum)
[facility])  [place] Team    [facility] pharmacy case will be closed as our team is transitioning from the [place] Management Department into the [facility] Quality Department and will no longer be using [facility] for documentation purposes.     Patient did not return the medication assistance forms back to [facility].  [name], PharmD, [place] Clinical Pharmacist [phone]

## 2020-02-18 ENCOUNTER — Telehealth: Payer: Self-pay

## 2020-02-18 ENCOUNTER — Telehealth: Payer: Self-pay | Admitting: Hematology

## 2020-02-18 NOTE — Telephone Encounter (Signed)
Cassandra Allen left vm requesting Dr. Burr Medico call her.  Cassandra Allen states she does not want her daughter in law involved in decision making.  She states that she is going to cancel her appts for 02/26/2020. This is for her EUS with biopsy

## 2020-02-18 NOTE — Telephone Encounter (Signed)
I called pt twice today. She did not answer my call the first time, so I called her daughter-in-law Anderson Malta, who lives with her.  Patient would like to cancer her EUS biopsy on April 15, because she cannot get up earlier and to be here at 7 AM.  Both her son and daughter-in-law work night shift, and would not be able to bring her in.  She arranges transportation through her insurance, but she does not think they are available that early in the morning.  Patient is still interested in biopsy and knowing if she has metastatic disease in liver, she is requesting the biopsy to be done around noon time in future. She understands she has to be n.p.o. for the procedure.  She would not consider radiation without the biopsy results.  She has canceled her appointment with Dr. Lisbeth Renshaw yesterday.    During the first phone call, I spoke with Anderson Malta also.  Per Anderson Malta, patient usually gets up around noon, does not do much at home.  Patient has been living with her son and Anderson Malta lately in [place].  Anderson Malta suggested her to transfer her cancer care to Dr. Red Christians, patient declined.  Per patient's request, I called her back again later today.  She stated she does not want Anderson Malta to be involved in her care, and she would like to come in and talk to me in person next week, about what to do next.  I sent a schedule message for that.  Truitt Merle  02/18/2020 5:02 PM

## 2020-02-19 ENCOUNTER — Telehealth: Payer: Self-pay

## 2020-02-19 NOTE — Telephone Encounter (Signed)
The appt for EUS and COVID have been cancelled.

## 2020-02-19 NOTE — Telephone Encounter (Signed)
-----   Message from Milus Banister, MD sent at 02/19/2020  5:19 AM EDT ----- Got it, thanks. Will stand by for further decisions.  Kailly Richoux, Please canecel her 4/15 EUS, thanks   ----- Message ----- From: Truitt Merle, MD Sent: 02/18/2020   5:02 PM EDT To: Milus Banister, MD, #  Dear GI team,  Please see my telephone note with her today. Please cancel her EUS appointment on 4/15, and her COVID test appointment before procedure.   I will keep you posted if we need to reschedule this, after I see her in my office next week.   Thanks   Krista Blue

## 2020-02-20 ENCOUNTER — Telehealth: Payer: Self-pay | Admitting: Hematology

## 2020-02-20 NOTE — Telephone Encounter (Signed)
Scheduled appt per 4/7 sch message - pt is aware of apts

## 2020-02-23 ENCOUNTER — Other Ambulatory Visit (HOSPITAL_COMMUNITY): Payer: Medicare HMO
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
## 2020-02-26 ENCOUNTER — Ambulatory Visit (HOSPITAL_COMMUNITY): Admit: 2020-02-26 | Payer: Medicare HMO | Admitting: Gastroenterology

## 2020-02-26 ENCOUNTER — Encounter (HOSPITAL_COMMUNITY): Payer: Self-pay

## 2020-02-26 SURGERY — UPPER ENDOSCOPIC ULTRASOUND (EUS) RADIAL
Anesthesia: Monitor Anesthesia Care

## 2020-02-27 ENCOUNTER — Inpatient Hospital Stay: Payer: Medicare HMO

## 2020-02-27 ENCOUNTER — Inpatient Hospital Stay: Payer: Medicare HMO | Attending: Nurse Practitioner | Admitting: Hematology

## 2020-02-27 ENCOUNTER — Other Ambulatory Visit: Payer: Self-pay

## 2020-02-27 VITALS — BP 177/73 | HR 86 | Temp 98.9°F | Resp 18 | Ht 62.0 in | Wt 163.3 lb

## 2020-02-27 DIAGNOSIS — D120 Benign neoplasm of cecum: Secondary | ICD-10-CM | POA: Diagnosis not present

## 2020-02-27 DIAGNOSIS — C188 Malignant neoplasm of overlapping sites of colon: Secondary | ICD-10-CM | POA: Diagnosis present

## 2020-02-27 DIAGNOSIS — Z79899 Other long term (current) drug therapy: Secondary | ICD-10-CM | POA: Insufficient documentation

## 2020-02-27 DIAGNOSIS — E119 Type 2 diabetes mellitus without complications: Secondary | ICD-10-CM | POA: Insufficient documentation

## 2020-02-27 DIAGNOSIS — Z86010 Personal history of colonic polyps: Secondary | ICD-10-CM | POA: Diagnosis not present

## 2020-02-27 DIAGNOSIS — Z933 Colostomy status: Secondary | ICD-10-CM | POA: Insufficient documentation

## 2020-02-27 DIAGNOSIS — F329 Major depressive disorder, single episode, unspecified: Secondary | ICD-10-CM | POA: Diagnosis not present

## 2020-02-27 DIAGNOSIS — D500 Iron deficiency anemia secondary to blood loss (chronic): Secondary | ICD-10-CM

## 2020-02-27 DIAGNOSIS — D124 Benign neoplasm of descending colon: Secondary | ICD-10-CM | POA: Insufficient documentation

## 2020-02-27 DIAGNOSIS — Z888 Allergy status to other drugs, medicaments and biological substances status: Secondary | ICD-10-CM | POA: Diagnosis not present

## 2020-02-27 DIAGNOSIS — C185 Malignant neoplasm of splenic flexure: Secondary | ICD-10-CM

## 2020-02-27 DIAGNOSIS — R6889 Other general symptoms and signs: Secondary | ICD-10-CM | POA: Diagnosis not present

## 2020-02-27 DIAGNOSIS — Z794 Long term (current) use of insulin: Secondary | ICD-10-CM | POA: Insufficient documentation

## 2020-02-27 DIAGNOSIS — E279 Disorder of adrenal gland, unspecified: Secondary | ICD-10-CM | POA: Diagnosis not present

## 2020-02-27 DIAGNOSIS — D122 Benign neoplasm of ascending colon: Secondary | ICD-10-CM | POA: Insufficient documentation

## 2020-02-27 DIAGNOSIS — E611 Iron deficiency: Secondary | ICD-10-CM | POA: Insufficient documentation

## 2020-02-27 DIAGNOSIS — I2510 Atherosclerotic heart disease of native coronary artery without angina pectoris: Secondary | ICD-10-CM | POA: Diagnosis not present

## 2020-02-27 DIAGNOSIS — Z9049 Acquired absence of other specified parts of digestive tract: Secondary | ICD-10-CM | POA: Insufficient documentation

## 2020-02-27 DIAGNOSIS — C787 Secondary malignant neoplasm of liver and intrahepatic bile duct: Secondary | ICD-10-CM | POA: Insufficient documentation

## 2020-02-27 DIAGNOSIS — D638 Anemia in other chronic diseases classified elsewhere: Secondary | ICD-10-CM | POA: Insufficient documentation

## 2020-02-27 DIAGNOSIS — N281 Cyst of kidney, acquired: Secondary | ICD-10-CM | POA: Insufficient documentation

## 2020-02-27 DIAGNOSIS — E669 Obesity, unspecified: Secondary | ICD-10-CM | POA: Diagnosis not present

## 2020-02-27 DIAGNOSIS — I110 Hypertensive heart disease with heart failure: Secondary | ICD-10-CM | POA: Insufficient documentation

## 2020-02-27 DIAGNOSIS — I700 Atherosclerosis of aorta: Secondary | ICD-10-CM | POA: Diagnosis not present

## 2020-02-27 DIAGNOSIS — D509 Iron deficiency anemia, unspecified: Secondary | ICD-10-CM

## 2020-02-27 DIAGNOSIS — I482 Chronic atrial fibrillation, unspecified: Secondary | ICD-10-CM | POA: Diagnosis not present

## 2020-02-27 DIAGNOSIS — Z95828 Presence of other vascular implants and grafts: Secondary | ICD-10-CM

## 2020-02-27 DIAGNOSIS — M1990 Unspecified osteoarthritis, unspecified site: Secondary | ICD-10-CM | POA: Insufficient documentation

## 2020-02-27 DIAGNOSIS — I5032 Chronic diastolic (congestive) heart failure: Secondary | ICD-10-CM | POA: Diagnosis not present

## 2020-02-27 DIAGNOSIS — R160 Hepatomegaly, not elsewhere classified: Secondary | ICD-10-CM | POA: Diagnosis not present

## 2020-02-27 LAB — CMP (CANCER CENTER ONLY)
ALT: 7 U/L (ref 0–44)
AST: 10 U/L — ABNORMAL LOW (ref 15–41)
Albumin: 3.7 g/dL (ref 3.5–5.0)
Alkaline Phosphatase: 66 U/L (ref 38–126)
Anion gap: 12 (ref 5–15)
BUN: 27 mg/dL — ABNORMAL HIGH (ref 8–23)
CO2: 24 mmol/L (ref 22–32)
Calcium: 8.8 mg/dL — ABNORMAL LOW (ref 8.9–10.3)
Chloride: 111 mmol/L (ref 98–111)
Creatinine: 0.88 mg/dL (ref 0.44–1.00)
GFR, Est AFR Am: >60 mL/min (ref >60)
GFR, Estimated: >60 mL/min (ref >60)
Glucose, Bld: 92 mg/dL (ref 70–99)
Potassium: 4.3 mmol/L (ref 3.5–5.1)
Sodium: 147 mmol/L — ABNORMAL HIGH (ref 135–145)
Total Bilirubin: 0.5 mg/dL (ref 0.3–1.2)
Total Protein: 6.9 g/dL (ref 6.5–8.1)

## 2020-02-27 LAB — CBC WITH DIFFERENTIAL (CANCER CENTER ONLY)
Abs Immature Granulocytes: 0.02 10*3/uL (ref 0.00–0.07)
Basophils Absolute: 0.1 10*3/uL (ref 0.0–0.1)
Basophils Relative: 1 %
Eosinophils Absolute: 0.1 10*3/uL (ref 0.0–0.5)
Eosinophils Relative: 2 %
HCT: 39.6 % (ref 36.0–46.0)
Hemoglobin: 12.3 g/dL (ref 12.0–15.0)
Immature Granulocytes: 0 %
Lymphocytes Relative: 12 %
Lymphs Abs: 0.8 10*3/uL (ref 0.7–4.0)
MCH: 29.4 pg (ref 26.0–34.0)
MCHC: 31.1 g/dL (ref 30.0–36.0)
MCV: 94.7 fL (ref 80.0–100.0)
Monocytes Absolute: 0.6 10*3/uL (ref 0.1–1.0)
Monocytes Relative: 9 %
Neutro Abs: 4.8 10*3/uL (ref 1.7–7.7)
Neutrophils Relative %: 76 %
Platelet Count: 192 10*3/uL (ref 150–400)
RBC: 4.18 MIL/uL (ref 3.87–5.11)
RDW: 13.9 % (ref 11.5–15.5)
WBC Count: 6.4 10*3/uL (ref 4.0–10.5)
nRBC: 0 % (ref 0.0–0.2)

## 2020-02-27 MED ORDER — ALTEPLASE 2 MG IJ SOLR
INTRAMUSCULAR | Status: AC
  Filled 2020-02-27: qty 2

## 2020-02-27 MED ORDER — HEPARIN SOD (PORK) LOCK FLUSH 100 UNIT/ML IV SOLN
500.0000 [IU] | Freq: Once | INTRAVENOUS | Status: AC
  Administered 2020-02-27: 500 [IU]
  Filled 2020-02-27: qty 5

## 2020-02-27 MED ORDER — ALTEPLASE 2 MG IJ SOLR
2.0000 mg | Freq: Once | INTRAMUSCULAR | Status: AC
  Administered 2020-02-27: 2 mg
  Filled 2020-02-27: qty 2

## 2020-02-27 MED ORDER — SODIUM CHLORIDE 0.9% FLUSH
10.0000 mL | Freq: Once | INTRAVENOUS | Status: AC
  Administered 2020-02-27: 10 mL
  Filled 2020-02-27: qty 10

## 2020-02-27 MED ORDER — SERTRALINE HCL 100 MG PO TABS: 200.0000 mg | ORAL_TABLET | Freq: Every day | ORAL | 3 refills | Status: DC

## 2020-02-27 NOTE — Progress Notes (Signed)
Only scant blood return from port. Cath flow place and labs drawn peripherally per order from Dr. Burr Medico.

## 2020-02-28 ENCOUNTER — Encounter: Payer: Self-pay | Admitting: Hematology

## 2020-03-01 ENCOUNTER — Telehealth: Payer: Self-pay | Admitting: Hematology

## 2020-03-01 ENCOUNTER — Encounter: Payer: Self-pay | Admitting: *Deleted

## 2020-03-01 LAB — IRON AND TIBC
Iron: 110 ug/dL (ref 41–142)
Saturation Ratios: 46 % (ref 21–57)
TIBC: 243 ug/dL (ref 236–444)
UIBC: 132 ug/dL (ref 120–384)

## 2020-03-01 LAB — FERRITIN: Ferritin: 185 ng/mL (ref 11–307)

## 2020-03-01 NOTE — Progress Notes (Signed)
[facility]  Clinical Social Work received referral from [employer].  [place] contacted patient at home to offer support and assess for needs.  CSW left patient a message offering support and encouraging patient to return call.    Johnnye Lana, MSW, LCSW, OSW-C Clinical Social Worker [facility] [phone]

## 2020-03-01 NOTE — Telephone Encounter (Signed)
Scheduled appt per 4/16 los.  Left a vm of the appt date and time. 

## 2020-03-02 ENCOUNTER — Encounter: Payer: Self-pay | Admitting: *Deleted

## 2020-03-02 ENCOUNTER — Telehealth: Payer: Self-pay | Admitting: *Deleted

## 2020-03-02 NOTE — Telephone Encounter (Signed)
Pt left message stating that she can't make 05/03/20 appt & asked if it could be moved to [facility] 6/23.  Message to Scheduler to call pt.

## 2020-03-02 NOTE — Progress Notes (Signed)
[place] Work  Clinical Social Work received return call from patient Monday afternoon.  CSW and patient discussed current needs and concerns.  Patient stated she is currently living with her son and daughter in law.  Patient stated her son has requested and increase in the amount of rent she pays.  Patient is concerned she will not be able to sustain that payment.   Patient stated she will receive her last disability payment from her previous employer in June, and only income will be her social security.  Patient stated she did not "want" to live in her sons house anymore, but also "did not want to move".  Patient stated she felt safe, but was concerned about the financial situation.  Patients only form of transportation is through her insurance benefit.  This will become a barrier if she has daily treatments and will need to travel to [place] from [place].  CSW and patient discussed resources and housing options.  Patient was not interested in ALF or moving at this time.  CSW and patient discussed communication strategies to use with her son.  CSW also offered to contact patients son, but patient declined.  CSW attempted to contact patient today to follow up and discuss patients options.  CSW left patient a message offering support and encouraging patient to return call.   Johnnye Lana, MSW, LCSW, OSW-C Clinical Social Worker [facility] [phone]

## 2020-03-03 ENCOUNTER — Encounter: Payer: Self-pay | Admitting: *Deleted

## 2020-03-03 ENCOUNTER — Telehealth: Payer: Self-pay | Admitting: Hematology

## 2020-03-03 DIAGNOSIS — R6889 Other general symptoms and signs: Secondary | ICD-10-CM | POA: Diagnosis not present

## 2020-03-03 DIAGNOSIS — C186 Malignant neoplasm of descending colon: Secondary | ICD-10-CM | POA: Diagnosis not present

## 2020-03-03 NOTE — Telephone Encounter (Signed)
R/s appt per 4/20 sch message - pt aware of new appt date and time   

## 2020-03-03 NOTE — Progress Notes (Signed)
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  MRI Abdomen 01/20/2020: Caudate metastasis has enlarged since 2019 but is similar based on comparison CT evaluation from September 16, 2019 no new or progressive disease is identified.  Very subtle area of susceptibility along the anterior liver margin may represent an additional site of disease but given the appearance of previous imaging studies this may represent a treated area of disease, particularly when comparing the study to 06/17/2019.  PET 12/17/2019: enlarginghypermetabolic lesion in the caudate liver lobe consistent with hepatic metastasis. Noadditional hypermetabolic nodal or distantmetastasis.  Colonoscopy 10/25/2018: Two 4 to 6 mm sessile polyps were found in the sigmoid colon.  Resected and retrieved.  A 6 mm sessile polyp was found in the descending colon.  Resected and retrieved.  Three 5 to 15 mm sessile polyps were found in the transverse colon.  Resected and retrieval were complete.  A 12 mm sessile polyp was found in the cecum.  Resection and retrieval were complete.  A 20 mm sessile polyp was found in the ascending colon.  Resection and retrieval were complete.  A malignancy partially obstructing large mass was found at 55 cm proximal to the anus.  Malignant partially obstructing tumor 20 cm proximal to the anus.  Biopsies of Colon 12/27/2018    Biopsies of Liver 11/20/2018   Past/Anticipated interventions by surgeon, if any:  12/27/2018 Left Sigmoid colectomy due to bowel obstruction from primary tumor ( left Colon).   Past/Anticipated interventions by medical oncology, if any:  Dr.  Burr Medico 02/27/2020 -I previously discussed her MRI abdomen from 01/20/20 with GI Tumor Board.Her hypermetabolic lesion incaudate lobe has been there since late 2019, biopsy at that time was negative.It has slightly enlarged since a year ago, but overall stable in the past 4 months. Tumor board recommends rebiopsy for tissue diagnosis. -I again discussed her option of Dr. Ardis Hughs doing an EUS biopsy to get more definitive diagnosis.She is not sure at this time.  -I discussed treatment options for her oligo liver met. She is a poor candidate for surgery and chemo. I again encouraged her to consult with Dr Lisbeth Renshaw again about proceeding with SBRT. She is agreeable.  -She is also interested in colostomy bag reversal when her surgeon approves. She has an appointment with her surgeon soon    Weight changes, if any: 40 pounds since surgery in 12/2018.  Bowel/Bladder complaints, if any: No  Nausea / Vomiting, if any: No  Pain issues, if any:  No   SAFETY ISSUES:  Prior radiation? No  Pacemaker/ICD? No  Possible current pregnancy? Postmenopausal  Is the patient on methotrexate? No  Current Complaints/Details: History -She was diagnosed withInvasive moderately differentiated adenocarcinomaof left colon in 10/2018.She presented with iron deficient anemia -HerstagingCT CAP from 11/01/18 showed several hepatic lesions suspicious for metastasis. Her subsequent liver biopsy from 11/20/18 was negativefor malignancy.I spoke with Dr. Jacelyn Grip didthebiopsy,her liver lesion was not very clear on the ultrasound, and was in a difficult spot for biopsy. -She started FOLFOX on 12/04/18.Chemo was stopped after cycle 2 due tobowel obstruction from her primary colon tumorwhich required urgentleft sigmoid colectomyon 12/27/18.  -Her primarytumor was removed  completely, she had negative margins and node negative.Liver biopsy was not feasible during her colon surgery. -Unfortunately after surgery she  developed post-op complications. She had anabscessthat required drainingin 4/2020along withfrequent hospitalization for recurrent fall, Afibetc. She has recovered well since hospitalization.

## 2020-03-03 NOTE — Progress Notes (Signed)
[place] Work  Clinical Social Work received a message from patient state she and her son "had worked everything out."  Patient stated they had come to an agreement on the cost of rent.  Patient stated she felt "everything was going to work out", and was relieved because she did not want to move.  CSW available to support as needed.    Johnnye Lana, MSW, LCSW, OSW-C Clinical Social Worker [facility] [phone]

## 2020-03-04 ENCOUNTER — Ambulatory Visit
Admission: RE | Admit: 2020-03-04 | Discharge: 2020-03-04 | Disposition: A | Discharge status: Home / Self Care | Payer: Medicare HMO | Source: Ambulatory Visit | Attending: Radiation Oncology | Admitting: Radiation Oncology

## 2020-03-04 ENCOUNTER — Encounter: Payer: Self-pay | Admitting: Radiation Oncology

## 2020-03-04 ENCOUNTER — Other Ambulatory Visit: Payer: Self-pay

## 2020-03-04 VITALS — Ht 62.0 in | Wt 153.0 lb

## 2020-03-04 DIAGNOSIS — C189 Malignant neoplasm of colon, unspecified: Secondary | ICD-10-CM

## 2020-03-04 DIAGNOSIS — C185 Malignant neoplasm of splenic flexure: Secondary | ICD-10-CM | POA: Diagnosis not present

## 2020-03-04 DIAGNOSIS — Z9049 Acquired absence of other specified parts of digestive tract: Secondary | ICD-10-CM | POA: Diagnosis not present

## 2020-03-04 DIAGNOSIS — C787 Secondary malignant neoplasm of liver and intrahepatic bile duct: Secondary | ICD-10-CM
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
## 2020-03-05 ENCOUNTER — Other Ambulatory Visit: Payer: Self-pay

## 2020-03-05 ENCOUNTER — Telehealth: Payer: Self-pay

## 2020-03-05 DIAGNOSIS — C185 Malignant neoplasm of splenic flexure: Secondary | ICD-10-CM

## 2020-03-05 NOTE — Telephone Encounter (Signed)
EUS scheduled, pt instructed and medications reviewed.  Patient instructions mailed to home.  Patient to call with any questions or concerns. COVID testing also advised.

## 2020-03-05 NOTE — Telephone Encounter (Signed)
-----   Message from Milus Banister, MD sent at 03/05/2020  5:23 AM EDT ----- Regino Bellow, Doristine Devoid. We'll get her in.  This will be for Biopsy and Fiducial markers (if the quick read on the biopsies confirm metastatic disease), correct?  Or should we plan to place a fiducial maker even if the quick read is non-diagnostic?   Shanicka Oldenkamp, She needs upper EUS, offer her next Thursday or the one after that (29th or 6th) unless Chester Holstein has availability sooner.  Upper EUS with fiducial markers, FNA.  For liver mass.  Thanks  Wynetta Fines   ----- Message ----- From: Hayden Pedro, PA-C Sent: 03/04/2020   1:34 PM EDT To: Milus Banister, MD  Good afternoon,  The patient would be willing to proceed with EUS and fiducial marker attempt to the caudate lobe as we've been chatting about. If you all still agree, Chong Sicilian would you let me know when she could have this procedure so we can coordinate simulation and radiation? Thanks, Bryson Ha

## 2020-03-08 ENCOUNTER — Telehealth: Payer: Self-pay | Admitting: Radiation Oncology

## 2020-03-08 NOTE — Telephone Encounter (Signed)
I called the pt after finding out her biopsy and fiducial markers will be placed on 5/6. I LM for the patient after scheduling 03/23/20 and asked her to call us back if needed to change.

## 2020-03-11 ENCOUNTER — Telehealth: Payer: Self-pay | Admitting: Radiation Oncology

## 2020-03-11 DIAGNOSIS — R5381 Other malaise: Secondary | ICD-10-CM | POA: Diagnosis not present

## 2020-03-11 NOTE — Telephone Encounter (Signed)
The patient wants to push out her EUS for biopsy and fiducial marker placement, and was given the number to call Dr. Ardis Hughs office. We will need to reschedule her simulation until after he performs the procedure. She is in agreement and I've asked his scheduler to let us know when her procedure gets rescheduled.

## 2020-03-15 ENCOUNTER — Other Ambulatory Visit (HOSPITAL_COMMUNITY): Payer: Medicare HMO

## 2020-03-16 ENCOUNTER — Telehealth: Payer: Self-pay | Admitting: Radiation Oncology

## 2020-03-16 NOTE — Telephone Encounter (Signed)
I left a message for the patient letting her know that Dr. Lisbeth Renshaw was agreeing that we could proceed without fiducial markers, the reason for this is that the patient has had multiple delays in getting to an EUS and fiducial marker appointment due to transportation, she is already delayed her care several weeks because of these issues.  Dr. Nickolas Madrid requested that we proceed without markers, and Dr. Lisbeth Renshaw is in agreement though dose distribution could be affected by not having his precise a marker to work off of.  I asked the patient to contact us back otherwise we will plan to keep her on the schedule next Tuesday for simulation though we could do this sooner if she would like if she could find transportation.    Carola Rhine, PAC

## 2020-03-18 ENCOUNTER — Telehealth: Payer: Self-pay | Admitting: Hematology

## 2020-03-18 ENCOUNTER — Ambulatory Visit
Admission: RE | Admit: 2020-03-18 | Discharge: 2020-03-18 | Disposition: A | Discharge status: Home / Self Care | Payer: Medicare HMO | Source: Ambulatory Visit | Attending: Radiation Oncology | Admitting: Radiation Oncology

## 2020-03-18 ENCOUNTER — Encounter: Payer: Self-pay | Admitting: Radiation Oncology

## 2020-03-18 ENCOUNTER — Other Ambulatory Visit: Payer: Self-pay

## 2020-03-18 DIAGNOSIS — C185 Malignant neoplasm of splenic flexure: Secondary | ICD-10-CM | POA: Diagnosis not present

## 2020-03-18 DIAGNOSIS — Z510 Encounter for antineoplastic radiation therapy: Secondary | ICD-10-CM | POA: Insufficient documentation

## 2020-03-18 DIAGNOSIS — R6889 Other general symptoms and signs: Secondary | ICD-10-CM | POA: Diagnosis not present

## 2020-03-18 DIAGNOSIS — C787 Secondary malignant neoplasm of liver and intrahepatic bile duct: Secondary | ICD-10-CM | POA: Diagnosis not present

## 2020-03-18 DIAGNOSIS — C189 Malignant neoplasm of colon, unspecified: Secondary | ICD-10-CM

## 2020-03-18 MED ORDER — HEPARIN SOD (PORK) LOCK FLUSH 100 UNIT/ML IV SOLN
500.0000 [IU] | Freq: Once | INTRAVENOUS | Status: AC
  Administered 2020-03-18: 500 [IU] via INTRAVENOUS

## 2020-03-18 MED ORDER — SODIUM CHLORIDE 0.9% FLUSH
10.0000 mL | Freq: Once | INTRAVENOUS | Status: AC
  Administered 2020-03-18: 10 mL via INTRAVENOUS

## 2020-03-18 NOTE — Telephone Encounter (Signed)
Rescheduled appt per md being on pal.  Spoke with pt and she is aware of the new appt date and time.

## 2020-03-18 NOTE — Progress Notes (Signed)
Has armband been applied?  Yes  Does patient have an allergy to IV contrast dye?: No   Has patient ever received premedication for IV contrast dye?: n/a  Does patient take metformin?: No  If patient does take metformin when was the last dose: n/a  Date of lab work: 02/27/2020   BUN: 27 EGfr: >60 CR: 0.88  IV site: Right Chest Port  Has IV site been added to flowsheet?  Yes   

## 2020-03-23 ENCOUNTER — Ambulatory Visit: Payer: Medicare HMO

## 2020-03-23 ENCOUNTER — Ambulatory Visit: Payer: Medicare HMO | Admitting: Radiation Oncology

## 2020-03-30 ENCOUNTER — Other Ambulatory Visit: Payer: Self-pay

## 2020-03-30 ENCOUNTER — Ambulatory Visit
Admission: RE | Admit: 2020-03-30 | Discharge: 2020-03-30 | Disposition: A | Discharge status: Home / Self Care | Payer: Medicare HMO | Source: Ambulatory Visit | Attending: Radiation Oncology | Admitting: Radiation Oncology

## 2020-03-30 DIAGNOSIS — C787 Secondary malignant neoplasm of liver and intrahepatic bile duct: Secondary | ICD-10-CM | POA: Diagnosis not present

## 2020-03-30 DIAGNOSIS — Z510 Encounter for antineoplastic radiation therapy: Secondary | ICD-10-CM | POA: Diagnosis not present

## 2020-03-30 DIAGNOSIS — C185 Malignant neoplasm of splenic flexure: Secondary | ICD-10-CM | POA: Diagnosis not present

## 2020-03-31 ENCOUNTER — Ambulatory Visit: Payer: Medicare HMO | Admitting: Radiation Oncology

## 2020-04-01 ENCOUNTER — Ambulatory Visit
Admission: RE | Admit: 2020-04-01 | Discharge: 2020-04-01 | Disposition: A | Discharge status: Home / Self Care | Payer: Medicare HMO | Source: Ambulatory Visit | Attending: Radiation Oncology | Admitting: Radiation Oncology

## 2020-04-01 ENCOUNTER — Other Ambulatory Visit: Payer: Self-pay

## 2020-04-01 DIAGNOSIS — C787 Secondary malignant neoplasm of liver and intrahepatic bile duct: Secondary | ICD-10-CM | POA: Diagnosis not present

## 2020-04-01 DIAGNOSIS — C185 Malignant neoplasm of splenic flexure: Secondary | ICD-10-CM | POA: Diagnosis not present

## 2020-04-01 DIAGNOSIS — Z510 Encounter for antineoplastic radiation therapy: Secondary | ICD-10-CM | POA: Diagnosis not present

## 2020-04-02 ENCOUNTER — Ambulatory Visit: Payer: Medicare HMO | Admitting: Radiation Oncology

## 2020-04-05 ENCOUNTER — Other Ambulatory Visit: Payer: Self-pay

## 2020-04-05 ENCOUNTER — Ambulatory Visit
Admission: RE | Admit: 2020-04-05 | Discharge: 2020-04-05 | Disposition: A | Discharge status: Home / Self Care | Payer: Medicare HMO | Source: Ambulatory Visit | Attending: Radiation Oncology | Admitting: Radiation Oncology

## 2020-04-05 DIAGNOSIS — C185 Malignant neoplasm of splenic flexure: Secondary | ICD-10-CM | POA: Diagnosis not present

## 2020-04-05 DIAGNOSIS — C787 Secondary malignant neoplasm of liver and intrahepatic bile duct: Secondary | ICD-10-CM | POA: Diagnosis not present

## 2020-04-05 DIAGNOSIS — Z510 Encounter for antineoplastic radiation therapy: Secondary | ICD-10-CM | POA: Diagnosis not present

## 2020-04-07 ENCOUNTER — Ambulatory Visit
Admission: RE | Admit: 2020-04-07 | Discharge: 2020-04-07 | Disposition: A | Discharge status: Home / Self Care | Payer: Medicare HMO | Source: Ambulatory Visit | Attending: Radiation Oncology | Admitting: Radiation Oncology

## 2020-04-07 ENCOUNTER — Other Ambulatory Visit: Payer: Self-pay

## 2020-04-07 DIAGNOSIS — C787 Secondary malignant neoplasm of liver and intrahepatic bile duct: Secondary | ICD-10-CM | POA: Diagnosis not present

## 2020-04-07 DIAGNOSIS — Z510 Encounter for antineoplastic radiation therapy: Secondary | ICD-10-CM | POA: Diagnosis not present

## 2020-04-07 DIAGNOSIS — C185 Malignant neoplasm of splenic flexure: Secondary | ICD-10-CM | POA: Diagnosis not present

## 2020-04-09 ENCOUNTER — Encounter: Payer: Self-pay | Admitting: Radiation Oncology

## 2020-04-09 ENCOUNTER — Ambulatory Visit
Admission: RE | Admit: 2020-04-09 | Discharge: 2020-04-09 | Disposition: A | Discharge status: Home / Self Care | Payer: Medicare HMO | Source: Ambulatory Visit | Attending: Radiation Oncology | Admitting: Radiation Oncology

## 2020-04-09 DIAGNOSIS — C185 Malignant neoplasm of splenic flexure: Secondary | ICD-10-CM | POA: Diagnosis not present

## 2020-04-09 DIAGNOSIS — Z933 Colostomy status: Secondary | ICD-10-CM | POA: Diagnosis not present

## 2020-04-09 DIAGNOSIS — C787 Secondary malignant neoplasm of liver and intrahepatic bile duct: Secondary | ICD-10-CM | POA: Diagnosis not present

## 2020-04-09 DIAGNOSIS — Z510 Encounter for antineoplastic radiation therapy: Secondary | ICD-10-CM | POA: Diagnosis not present

## 2020-04-10 DIAGNOSIS — R5381 Other malaise: Secondary | ICD-10-CM | POA: Diagnosis not present

## 2020-04-22 ENCOUNTER — Encounter (HOSPITAL_COMMUNITY): Admission: RE | Payer: Self-pay | Source: Home / Self Care

## 2020-04-22 ENCOUNTER — Ambulatory Visit (HOSPITAL_COMMUNITY): Admission: RE | Admit: 2020-04-22 | Payer: Medicare HMO | Source: Home / Self Care | Admitting: Gastroenterology

## 2020-04-22 DIAGNOSIS — I10 Essential (primary) hypertension: Secondary | ICD-10-CM | POA: Diagnosis not present

## 2020-04-22 DIAGNOSIS — G894 Chronic pain syndrome: Secondary | ICD-10-CM | POA: Diagnosis not present

## 2020-04-22 DIAGNOSIS — N3281 Overactive bladder: Secondary | ICD-10-CM | POA: Diagnosis not present

## 2020-04-22 DIAGNOSIS — R6889 Other general symptoms and signs: Secondary | ICD-10-CM | POA: Diagnosis not present

## 2020-04-22 DIAGNOSIS — I4821 Permanent atrial fibrillation: Secondary | ICD-10-CM | POA: Diagnosis not present

## 2020-04-22 DIAGNOSIS — Z79899 Other long term (current) drug therapy: Secondary | ICD-10-CM | POA: Diagnosis not present

## 2020-04-22 DIAGNOSIS — R42 Dizziness and giddiness: Secondary | ICD-10-CM | POA: Diagnosis not present

## 2020-04-22 DIAGNOSIS — F339 Major depressive disorder, recurrent, unspecified: Secondary | ICD-10-CM | POA: Diagnosis not present

## 2020-04-22 DIAGNOSIS — E1165 Type 2 diabetes mellitus with hyperglycemia: Secondary | ICD-10-CM | POA: Diagnosis not present

## 2020-04-22 SURGERY — UPPER ENDOSCOPIC ULTRASOUND (EUS) RADIAL
Anesthesia: Monitor Anesthesia Care

## 2020-04-26 ENCOUNTER — Telehealth: Payer: Self-pay

## 2020-04-26 NOTE — Telephone Encounter (Signed)
Cassandra Allen left vm stating she needed to change her upcoming appt.  Scheduling message sent.

## 2020-04-28 ENCOUNTER — Other Ambulatory Visit: Payer: Medicare HMO

## 2020-04-28 ENCOUNTER — Ambulatory Visit: Payer: Medicare HMO | Admitting: Hematology

## 2020-05-03 ENCOUNTER — Ambulatory Visit: Payer: Medicare HMO | Admitting: Hematology

## 2020-05-03 ENCOUNTER — Other Ambulatory Visit: Payer: Medicare HMO

## 2020-05-05 ENCOUNTER — Other Ambulatory Visit: Payer: Medicare HMO

## 2020-05-05 ENCOUNTER — Ambulatory Visit: Payer: Medicare HMO | Admitting: Hematology

## 2020-05-10 DIAGNOSIS — Z933 Colostomy status: Secondary | ICD-10-CM | POA: Diagnosis not present

## 2020-05-11 DIAGNOSIS — R5381 Other malaise: Secondary | ICD-10-CM | POA: Diagnosis not present

## 2020-05-14 ENCOUNTER — Telehealth: Payer: Self-pay

## 2020-05-14 ENCOUNTER — Inpatient Hospital Stay: Payer: Medicare HMO

## 2020-05-14 ENCOUNTER — Inpatient Hospital Stay: Payer: Medicare HMO | Admitting: Hematology

## 2020-05-14 NOTE — Telephone Encounter (Signed)
Ms Cassandra Allen called and canceled her appt for today.  She states she will call to reschedule.

## 2020-05-17 NOTE — Progress Notes (Signed)
  Radiation Oncology         [phone] ________________________________  Name: Cassandra Allen MRN: [id number]  Date: 04/09/2020  DOB: 29-Nov-1945  End of Treatment Note  Diagnosis:  stage IV colon cancer with liver metastasis   Indication for treatment::  curative       Radiation treatment dates:   03/30/20 - 04/09/20  Site/dose:   The patient was treated to the liver with a course of stereotactic body radiation treatment.  The patient received 50 Gray in 5 fractions using a 3-field IMRT technique.  Narrative: The patient tolerated radiation treatment relatively well.   No unexpected difficulties.  The patient's breathing did not significantly change during the course of the treatment.  Plan: The patient has completed radiation treatment. The patient will return to radiation oncology clinic for routine followup in one month. I advised the patient to call or return sooner if they have any questions or concerns related to their recovery or treatment. ________________________________  Jodelle Gross, M.D., Ph.D.

## 2020-05-24 ENCOUNTER — Telehealth: Payer: Self-pay

## 2020-05-24 NOTE — Telephone Encounter (Signed)
Cassandra Allen left vm stating she has shingles and will need to reschedule her appt for a couple of weeks from now.  She states she will call for an appt.

## 2020-06-02 ENCOUNTER — Telehealth: Payer: Self-pay

## 2020-06-02 NOTE — Telephone Encounter (Signed)
Cassandra Allen left vm she is still having a lot of pain form shingles.  She wanted [place] to know she is not evading [place].

## 2020-06-08 ENCOUNTER — Telehealth: Payer: Self-pay | Admitting: Radiation Oncology
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
## 2020-06-11 DIAGNOSIS — Z933 Colostomy status: Secondary | ICD-10-CM | POA: Diagnosis not present

## 2020-06-14 DIAGNOSIS — Z933 Colostomy status: Secondary | ICD-10-CM | POA: Diagnosis not present

## 2020-06-16 ENCOUNTER — Telehealth: Payer: Self-pay
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
## 2020-06-16 NOTE — Telephone Encounter (Signed)
Cassandra Allen left vm  Stating she had pain in her left breast.  I returned her call and left vm.

## 2020-06-17 ENCOUNTER — Encounter: Payer: Self-pay | Admitting: General Practice

## 2020-06-17 NOTE — Progress Notes (Signed)
[place] CSW Progress Notes  Call to patient at request of [place].  Patient reports that she will be "evicted" from sons home where she currently lives.  Called patient.  States that she lives w son/daughter in [employer].  Describes instances where she feels uncomfortable (has been told she cannot use kitchen after midnight, says son wants to limit her grocery delivery choices/not get "so much junk food", "they say that I cannot do anything like use the dishwasher"; "if I make something to eat, they wont touch it", "they don't allow me to have any of the box lunches they make for themselves", "if the dog pees, I have to clean it up."  Feels "everything I do is wrong." Son provides all medications, she is not allowed to manage her own medications.  In May, son "made a contract that said I had to do certain things or they would take money from me."  Reports that she is called names.  Has now been told "I have till the end of the month to get out", they will put me in a nursing home.  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  Feels she cannot afford to return to an independent apartment.  Per patient, daughter in law has initiated Medicaid application - she has received information on applying for Food Stamps.    Reached out to [facility]  - their case manager will call patient to discuss options/resources.  Patient agreeable to this contact.  Edwyna Shell, LCSW Clinical Social Worker Phone:  [phone] Cell:  [phone]

## 2020-06-18 ENCOUNTER — Inpatient Hospital Stay: Payer: Medicare HMO | Attending: Hematology | Admitting: General Practice

## 2020-06-18 ENCOUNTER — Telehealth: Payer: Self-pay | Admitting: General Practice

## 2020-06-18 DIAGNOSIS — E279 Disorder of adrenal gland, unspecified: Secondary | ICD-10-CM | POA: Insufficient documentation

## 2020-06-18 DIAGNOSIS — R160 Hepatomegaly, not elsewhere classified: Secondary | ICD-10-CM | POA: Insufficient documentation

## 2020-06-18 DIAGNOSIS — D122 Benign neoplasm of ascending colon: Secondary | ICD-10-CM | POA: Insufficient documentation

## 2020-06-18 DIAGNOSIS — D120 Benign neoplasm of cecum: Secondary | ICD-10-CM | POA: Insufficient documentation

## 2020-06-18 DIAGNOSIS — I700 Atherosclerosis of aorta: Secondary | ICD-10-CM | POA: Insufficient documentation

## 2020-06-18 DIAGNOSIS — E119 Type 2 diabetes mellitus without complications: Secondary | ICD-10-CM | POA: Insufficient documentation

## 2020-06-18 DIAGNOSIS — D509 Iron deficiency anemia, unspecified: Secondary | ICD-10-CM | POA: Insufficient documentation

## 2020-06-18 DIAGNOSIS — Z79899 Other long term (current) drug therapy: Secondary | ICD-10-CM | POA: Insufficient documentation

## 2020-06-18 DIAGNOSIS — F329 Major depressive disorder, single episode, unspecified: Secondary | ICD-10-CM | POA: Insufficient documentation

## 2020-06-18 DIAGNOSIS — I482 Chronic atrial fibrillation, unspecified: Secondary | ICD-10-CM | POA: Insufficient documentation

## 2020-06-18 DIAGNOSIS — D124 Benign neoplasm of descending colon: Secondary | ICD-10-CM | POA: Insufficient documentation

## 2020-06-18 DIAGNOSIS — Z794 Long term (current) use of insulin: Secondary | ICD-10-CM | POA: Insufficient documentation

## 2020-06-18 DIAGNOSIS — F419 Anxiety disorder, unspecified: Secondary | ICD-10-CM | POA: Insufficient documentation

## 2020-06-18 DIAGNOSIS — E669 Obesity, unspecified: Secondary | ICD-10-CM | POA: Insufficient documentation

## 2020-06-18 DIAGNOSIS — I2510 Atherosclerotic heart disease of native coronary artery without angina pectoris: Secondary | ICD-10-CM | POA: Insufficient documentation

## 2020-06-18 DIAGNOSIS — C186 Malignant neoplasm of descending colon: Secondary | ICD-10-CM | POA: Insufficient documentation

## 2020-06-18 DIAGNOSIS — I110 Hypertensive heart disease with heart failure: Secondary | ICD-10-CM | POA: Insufficient documentation

## 2020-06-18 DIAGNOSIS — Z9049 Acquired absence of other specified parts of digestive tract: Secondary | ICD-10-CM | POA: Insufficient documentation

## 2020-06-18 DIAGNOSIS — C7889 Secondary malignant neoplasm of other digestive organs: Secondary | ICD-10-CM | POA: Insufficient documentation

## 2020-06-18 DIAGNOSIS — Z888 Allergy status to other drugs, medicaments and biological substances status: Secondary | ICD-10-CM | POA: Insufficient documentation

## 2020-06-18 DIAGNOSIS — Z933 Colostomy status: Secondary | ICD-10-CM | POA: Insufficient documentation

## 2020-06-18 DIAGNOSIS — B029 Zoster without complications: Secondary | ICD-10-CM | POA: Insufficient documentation

## 2020-06-18 DIAGNOSIS — I5032 Chronic diastolic (congestive) heart failure: Secondary | ICD-10-CM | POA: Insufficient documentation

## 2020-06-18 DIAGNOSIS — Z86010 Personal history of colonic polyps: Secondary | ICD-10-CM | POA: Insufficient documentation

## 2020-06-18 NOTE — Telephone Encounter (Addendum)
[place] CSW Progress Notes  Call to patient to check on progress.  She has spoken w Pearlean Brownie, [place] resource specialist.  Randa Lynn has assessed for resources, sent patient lists of senior/disabled housing options in both [place] and [place] counties.  Has also sent her names of two counselors who might do telehealth visits w her, as well as mobile crisis management.  Patient has not discussed any of these options w her son or daughter in law.  She will review the information and decide how to proceed.  Per Randa Lynn, patient may also be a candidate for the [facility] program (nonresidential nursing home), this would provide SNF level services to her while she is residing at home.  Patient is concerned about transportation arrangements for upcoming [facility] appt on Monday - states [place] was going to arrange this.  [employer], they are working on her ride.  Will call her when scheduled.  Patient appears satisfied to get information packet from [facility] and to be able to see oncologist on Monday.  [facility] states that they will follow up w her periodically.    Edwyna Shell, LCSW Clinical Social Worker Phone:  [phone]
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
## 2020-06-21 ENCOUNTER — Inpatient Hospital Stay: Payer: Medicare HMO

## 2020-06-21 ENCOUNTER — Telehealth: Payer: Self-pay | Admitting: Hematology

## 2020-06-21 ENCOUNTER — Inpatient Hospital Stay (HOSPITAL_BASED_OUTPATIENT_CLINIC_OR_DEPARTMENT_OTHER): Payer: Medicare HMO | Admitting: Hematology

## 2020-06-21 ENCOUNTER — Other Ambulatory Visit: Payer: Self-pay

## 2020-06-21 ENCOUNTER — Encounter: Payer: Self-pay | Admitting: Hematology

## 2020-06-21 VITALS — BP 128/50 | HR 59 | Temp 94.3°F | Resp 18 | Ht 62.0 in | Wt 157.2 lb

## 2020-06-21 DIAGNOSIS — D122 Benign neoplasm of ascending colon: Secondary | ICD-10-CM | POA: Diagnosis not present

## 2020-06-21 DIAGNOSIS — F329 Major depressive disorder, single episode, unspecified: Secondary | ICD-10-CM | POA: Diagnosis not present

## 2020-06-21 DIAGNOSIS — D120 Benign neoplasm of cecum: Secondary | ICD-10-CM | POA: Diagnosis not present

## 2020-06-21 DIAGNOSIS — D509 Iron deficiency anemia, unspecified: Secondary | ICD-10-CM

## 2020-06-21 DIAGNOSIS — E279 Disorder of adrenal gland, unspecified: Secondary | ICD-10-CM | POA: Diagnosis not present

## 2020-06-21 DIAGNOSIS — F419 Anxiety disorder, unspecified: Secondary | ICD-10-CM | POA: Diagnosis not present

## 2020-06-21 DIAGNOSIS — C7889 Secondary malignant neoplasm of other digestive organs: Secondary | ICD-10-CM | POA: Diagnosis not present

## 2020-06-21 DIAGNOSIS — Z9049 Acquired absence of other specified parts of digestive tract: Secondary | ICD-10-CM | POA: Diagnosis not present

## 2020-06-21 DIAGNOSIS — Z794 Long term (current) use of insulin: Secondary | ICD-10-CM | POA: Diagnosis not present

## 2020-06-21 DIAGNOSIS — Z888 Allergy status to other drugs, medicaments and biological substances status: Secondary | ICD-10-CM | POA: Diagnosis not present

## 2020-06-21 DIAGNOSIS — I700 Atherosclerosis of aorta: Secondary | ICD-10-CM | POA: Diagnosis not present

## 2020-06-21 DIAGNOSIS — Z79899 Other long term (current) drug therapy: Secondary | ICD-10-CM | POA: Diagnosis not present

## 2020-06-21 DIAGNOSIS — I5032 Chronic diastolic (congestive) heart failure: Secondary | ICD-10-CM | POA: Diagnosis not present

## 2020-06-21 DIAGNOSIS — E119 Type 2 diabetes mellitus without complications: Secondary | ICD-10-CM | POA: Diagnosis not present

## 2020-06-21 DIAGNOSIS — D124 Benign neoplasm of descending colon: Secondary | ICD-10-CM | POA: Diagnosis not present

## 2020-06-21 DIAGNOSIS — C185 Malignant neoplasm of splenic flexure: Secondary | ICD-10-CM | POA: Diagnosis not present

## 2020-06-21 DIAGNOSIS — I482 Chronic atrial fibrillation, unspecified: Secondary | ICD-10-CM | POA: Diagnosis not present

## 2020-06-21 DIAGNOSIS — Z95828 Presence of other vascular implants and grafts: Secondary | ICD-10-CM

## 2020-06-21 DIAGNOSIS — I2510 Atherosclerotic heart disease of native coronary artery without angina pectoris: Secondary | ICD-10-CM | POA: Diagnosis not present

## 2020-06-21 DIAGNOSIS — R160 Hepatomegaly, not elsewhere classified: Secondary | ICD-10-CM | POA: Diagnosis not present

## 2020-06-21 DIAGNOSIS — Z86010 Personal history of colonic polyps: Secondary | ICD-10-CM | POA: Diagnosis not present

## 2020-06-21 DIAGNOSIS — I110 Hypertensive heart disease with heart failure: Secondary | ICD-10-CM | POA: Diagnosis not present

## 2020-06-21 DIAGNOSIS — B029 Zoster without complications: Secondary | ICD-10-CM | POA: Diagnosis not present

## 2020-06-21 DIAGNOSIS — Z933 Colostomy status: Secondary | ICD-10-CM | POA: Diagnosis not present

## 2020-06-21 DIAGNOSIS — C186 Malignant neoplasm of descending colon: Secondary | ICD-10-CM | POA: Diagnosis not present

## 2020-06-21 DIAGNOSIS — E669 Obesity, unspecified: Secondary | ICD-10-CM | POA: Diagnosis not present

## 2020-06-21 LAB — CBC WITH DIFFERENTIAL (CANCER CENTER ONLY)
Abs Immature Granulocytes: 0.03 10*3/uL (ref 0.00–0.07)
Basophils Absolute: 0.1 10*3/uL (ref 0.0–0.1)
Basophils Relative: 1 %
Eosinophils Absolute: 0.2 10*3/uL (ref 0.0–0.5)
Eosinophils Relative: 3 %
HCT: 34.7 % — ABNORMAL LOW (ref 36.0–46.0)
Hemoglobin: 10.7 g/dL — ABNORMAL LOW (ref 12.0–15.0)
Immature Granulocytes: 0 %
Lymphocytes Relative: 9 %
Lymphs Abs: 0.6 10*3/uL — ABNORMAL LOW (ref 0.7–4.0)
MCH: 28.8 pg (ref 26.0–34.0)
MCHC: 30.8 g/dL (ref 30.0–36.0)
MCV: 93.5 fL (ref 80.0–100.0)
Monocytes Absolute: 0.7 10*3/uL (ref 0.1–1.0)
Monocytes Relative: 10 %
Neutro Abs: 5.2 10*3/uL (ref 1.7–7.7)
Neutrophils Relative %: 77 %
Platelet Count: 151 10*3/uL (ref 150–400)
RBC: 3.71 MIL/uL — ABNORMAL LOW (ref 3.87–5.11)
RDW: 16.5 % — ABNORMAL HIGH (ref 11.5–15.5)
WBC Count: 6.8 10*3/uL (ref 4.0–10.5)
nRBC: 0 % (ref 0.0–0.2)

## 2020-06-21 LAB — IRON AND TIBC
Iron: 78 ug/dL (ref 41–142)
Saturation Ratios: 35 % (ref 21–57)
TIBC: 220 ug/dL — ABNORMAL LOW (ref 236–444)
UIBC: 142 ug/dL (ref 120–384)

## 2020-06-21 LAB — CMP (CANCER CENTER ONLY)
ALT: 8 U/L (ref 0–44)
AST: 11 U/L — ABNORMAL LOW (ref 15–41)
Albumin: 3.3 g/dL — ABNORMAL LOW (ref 3.5–5.0)
Alkaline Phosphatase: 68 U/L (ref 38–126)
Anion gap: 7 (ref 5–15)
BUN: 47 mg/dL — ABNORMAL HIGH (ref 8–23)
CO2: 22 mmol/L (ref 22–32)
Calcium: 9 mg/dL (ref 8.9–10.3)
Chloride: 113 mmol/L — ABNORMAL HIGH (ref 98–111)
Creatinine: 1.11 mg/dL — ABNORMAL HIGH (ref 0.44–1.00)
GFR, Est AFR Am: 57 mL/min — ABNORMAL LOW (ref >60)
GFR, Estimated: 49 mL/min — ABNORMAL LOW (ref >60)
Glucose, Bld: 94 mg/dL (ref 70–99)
Potassium: 4.9 mmol/L (ref 3.5–5.1)
Sodium: 142 mmol/L (ref 135–145)
Total Bilirubin: 0.3 mg/dL (ref 0.3–1.2)
Total Protein: 6.1 g/dL — ABNORMAL LOW (ref 6.5–8.1)

## 2020-06-21 LAB — FERRITIN: Ferritin: 325 ng/mL — ABNORMAL HIGH (ref 11–307)

## 2020-06-21 MED ORDER — HYDROCORTISONE 1 % EX LOTN
1.0000 | TOPICAL_LOTION | Freq: Two times a day (BID) | CUTANEOUS | 0 refills | Status: DC
Start: 2020-06-21 — End: 2020-10-02

## 2020-06-21 MED ORDER — SODIUM CHLORIDE 0.9% FLUSH
10.0000 mL | Freq: Once | INTRAVENOUS | Status: AC
  Administered 2020-06-21: 10 mL
  Filled 2020-06-21: qty 10

## 2020-06-21 MED ORDER — HEPARIN SOD (PORK) LOCK FLUSH 100 UNIT/ML IV SOLN
500.0000 [IU] | Freq: Once | INTRAVENOUS | Status: AC
  Administered 2020-06-21: 500 [IU]
  Filled 2020-06-21: qty 5

## 2020-06-21 MED ORDER — SERTRALINE HCL 50 MG PO TABS: 150.0000 mg | ORAL_TABLET | Freq: Every day | ORAL | 1 refills | Status: DC

## 2020-06-21 NOTE — Telephone Encounter (Signed)
Scheduled appointments per 8/9 LOS. Patient is aware of appointments date and times. I printed out an updated calendar for patient at time of service as well.

## 2020-07-08 IMAGING — MR MR ABDOMEN WO/W CM
10 of 18 series · 24 of 48 positions shown · IV contrast (17ml multihance)
Comparison: CT abdomen 11/01/2018

CLINICAL DATA: Colon cancer with liver lesions and a left adrenal
abnormality, for further workup.

EXAM:
MRI ABDOMEN WITHOUT AND WITH CONTRAST
TECHNIQUE: Multiplanar multisequence MR imaging of the abdomen was performed
both before and after the administration of intravenous contrast.
CONTRAST:  17mL MULTIHANCE GADOBENATE DIMEGLUMINE 529 MG/ML IV SOLN

[Series 2: cor haste · coronal · 5.0mm · 0.78mm/px · 2 of 42 slices shown]
[im 1/42]
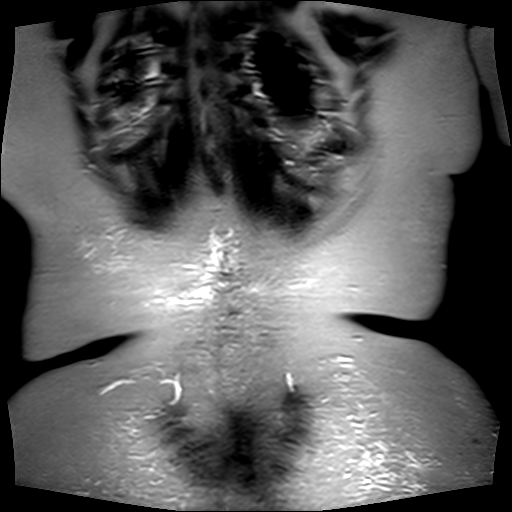
[im 42/42]
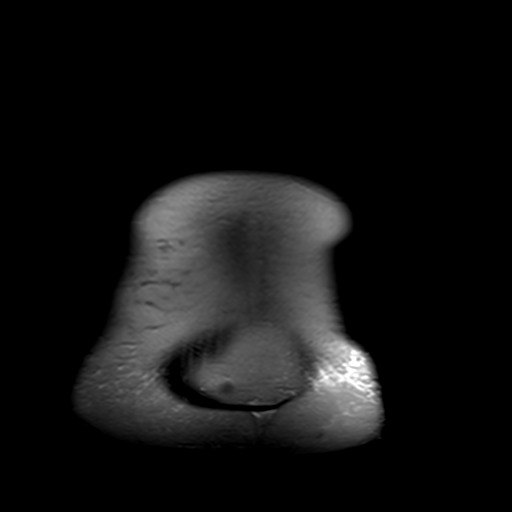

[Series 3: axial haste · axial · 6.0mm · 0.78mm/px · z∈[-55,+182]mm · 2 of 37 slices shown]
[im 1/37]
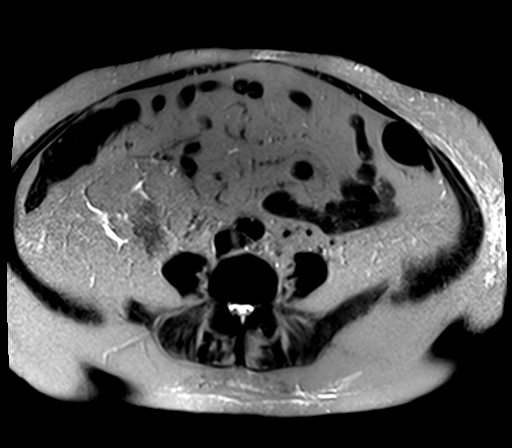
[im 37/37]
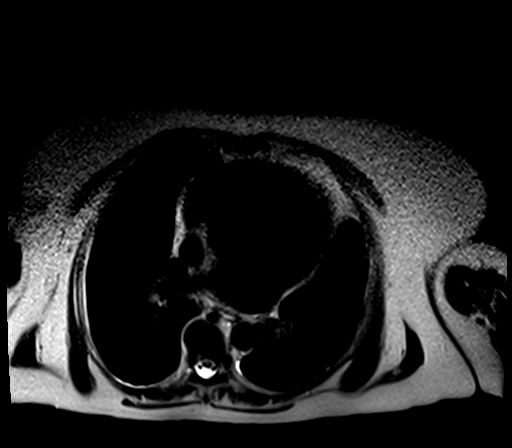

[Series 4: T1 · axial · 6.0mm · 0.78mm/px · z∈[-55,+182]mm · 4 of 74 slices shown (1 of 2)]
[im 1/74]
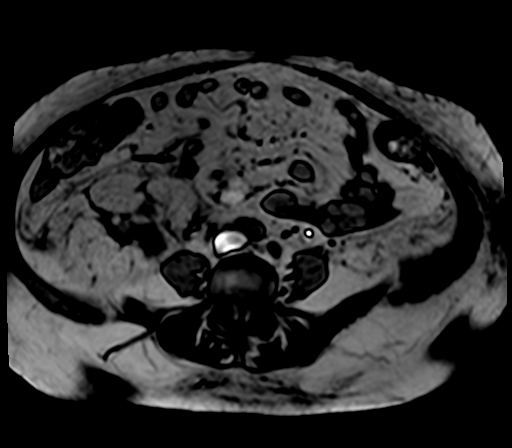
[im 25/74]
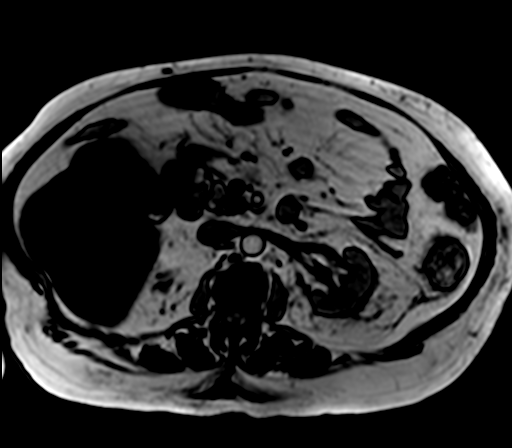
[im 49/74]
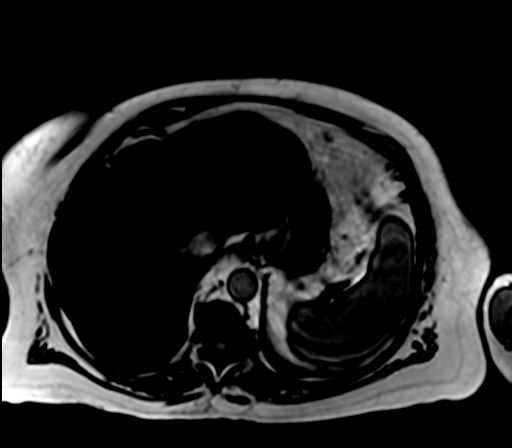
[im 74/74]
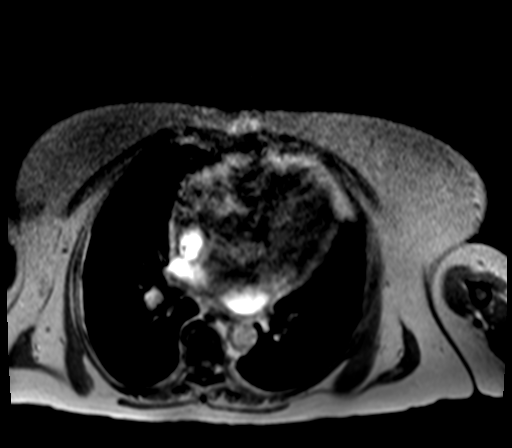

[Series 5: T1 · coronal · 4.0mm · 0.78mm/px · 2 of 48 slices shown (2 of 2)]
[im 1/48]
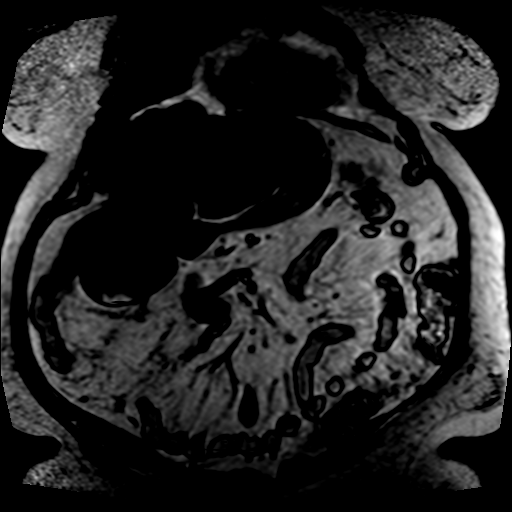
[im 48/48]
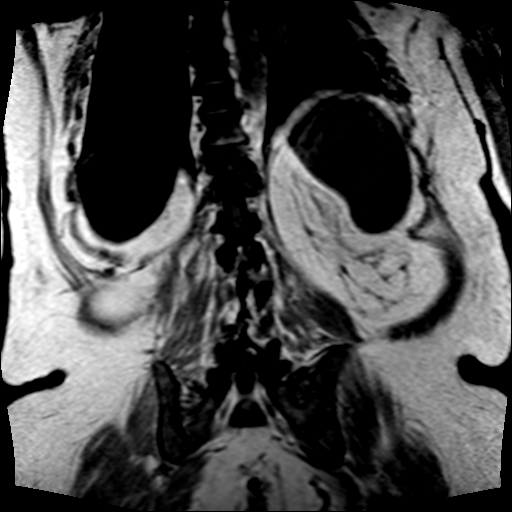

[Series 6: bSSFP · axial · 4.0mm · 0.78mm/px · z∈[-59,+181]mm · 2 of 61 slices shown]
[im 1/61]
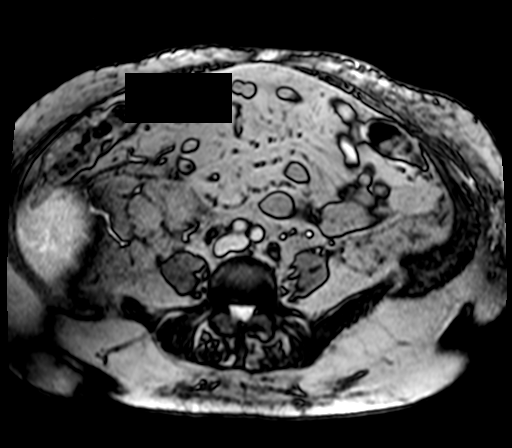
[im 61/61]
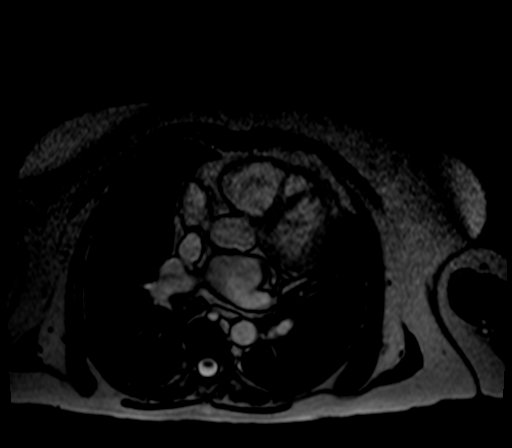

[Series 7: T2 · axial · 6.0mm · 1.25mm/px · 1 of 34 slices shown]
[im 1/34]
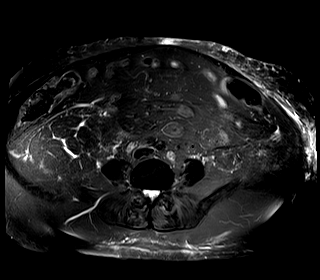

[Series 8: ep2d_[id number]_500_800_p2_trig · axial · 6.0mm · 2.08mm/px · z∈[-55,+182]mm · 4 of 102 slices shown]
[im 1/102]
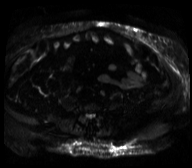
[im 34/102]
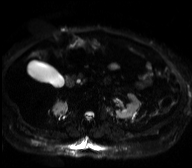
[im 68/102]
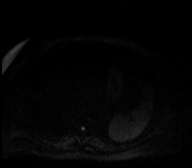
[im 102/102]
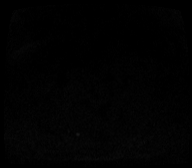

[Series 9: ep2d_[id number]_500_800_p2_trig_adc · axial · 6.0mm · 2.08mm/px · 1 of 34 slices shown]
[im 1/34]
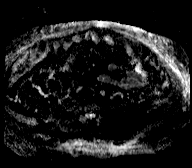

[Series 10: T1 dynamic · axial · non-contrast · 2.5mm · 0.78mm/px · z∈[-58,+179]mm · 3 of 96 slices shown]
[im 1/96]
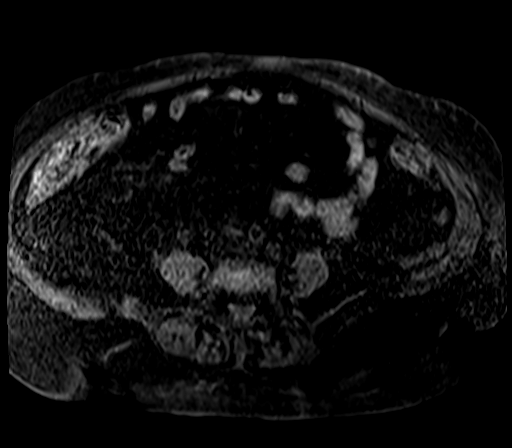
[im 48/96]
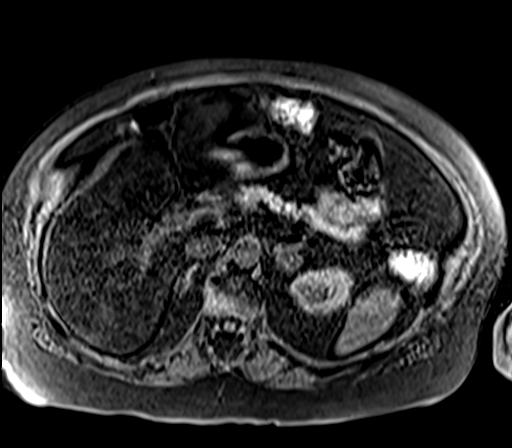
[im 96/96]
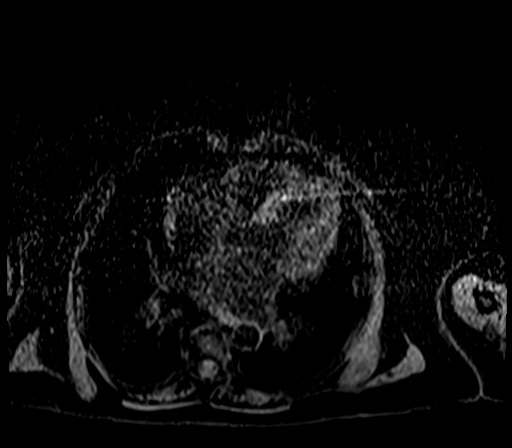

[Series 11: T1 dynamic post-contrast · axial · 2.5mm · 0.78mm/px · z∈[-58,+179]mm · 3 of 96 slices shown]
[im 1/96]
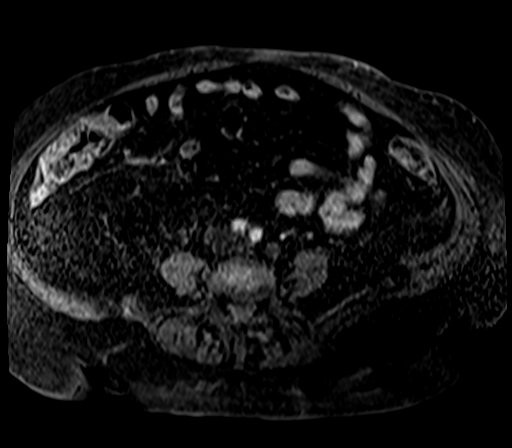
[im 48/96]
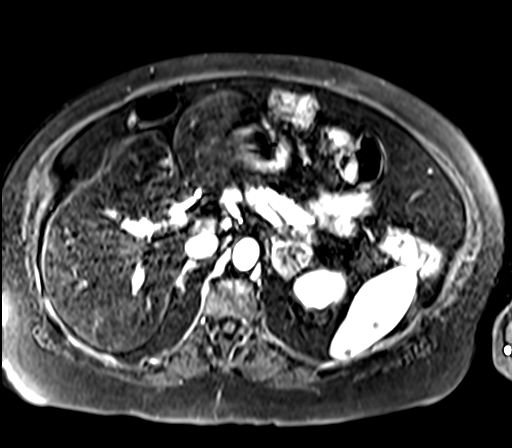
[im 96/96]
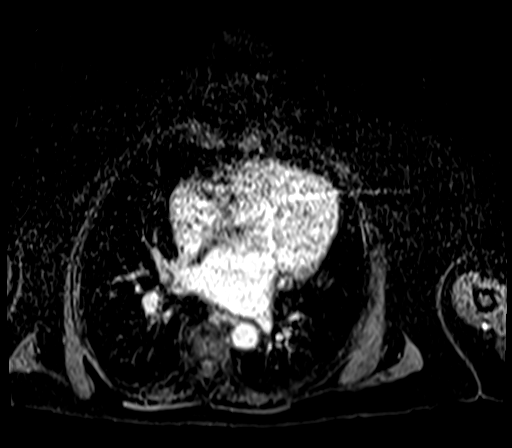

[24 of 48 positions shown; findings below may reference images not displayed]

FINDINGS: Lower chest: Cardiomegaly.

Hepatobiliary: Multiple small gallstones are present in the
gallbladder. There is choledocholithiasis with a 7 mm in long axis
stone in the distal CBD on image [DATE]. Considerably reduced in phase
signal in the liver with associated low T2 signal in the liver
favoring hemochromatosis.

Although on the prior exam the scattered hypodense liver lesions
were not well-defined, on today's exam most of these lesions are
essentially occult. The exception is the caudate lobe lesion which
currently measures 3.8 by 2.7 cm, previously 4.0 by 3.3 cm by CT.
There also some vague areas of faint enhancement in the lateral
segment left hepatic lobe fall which could reflect residua of prior
metastatic lesions. A 7 mm focus of enhancement in the right hepatic
lobe on image 32/19 likewise could reflect a prior metastatic lesion
but was previously 1.4 cm on the prior CT. The overall pattern is
one of significant improvement.

Pancreas: 5 mm nonenhancing T2 hyperintense lesion in the pancreatic
head on image [DATE], likely clinically inconsequential given the
context of the patient's history. Otherwise unremarkable.

Spleen: In addition to a small inferior splenic cyst, there are
several small foci of enhancement in the spleen including a stable
peripheral 1.1 by 0.8 cm lesion on image 30/18 which may right may
represent metastatic disease, but which are technically nonspecific.

Adrenals/Urinary Tract: There is considerable signal dropout of the
vast majority of the left adrenal mass on out of phase images. The
mass measures 3.5 by 2.8 cm. A small cystic lateral portion of the
mass does not dropout in signal. Still, the overall appearance
favors adenoma over a collision lesion with an adenoma. Chronic
appearing bilateral perirenal stranding. No hydronephrosis. A 1.2 cm
lesion with high precontrast T1 signal characteristics in the right
kidney upper pole is compatible with a Bosniak category 2 cyst.

Stomach/Bowel: Unremarkable

Vascular/Lymphatic: Aortoiliac atherosclerotic vascular disease. No
pathologic adenopathy identified.

Other:  No supplemental non-categorized findings.

Musculoskeletal: Lumbar spondylosis and degenerative disc disease.
IMPRESSION: 1. The overall improvement with resolution of the number of the
hepatic metastatic lesions. The dominant lesion in the caudate lobe
is reduced in size from previous 4.0 by 3.3 cm to current 3.8 by
cm.
2. The splenic lesions are similar to prior and nonspecific.
3. The left adrenal mass is primarily an adrenal adenoma. There is a
cystic component laterally which is probably incidental rather than
from a collision lesion.
4. Hepatic hemochromatosis.
5. 7 mm gallstone in the common bile duct compatible with
choledocholithiasis. There also multiple gallstones in the
gallbladder.
6.  Aortic Atherosclerosis ([id number]).
7. Bosniak category 2 cyst in the right kidney upper pole.

## 2020-07-16 ENCOUNTER — Other Ambulatory Visit: Payer: Self-pay | Admitting: Hematology

## 2020-07-29 ENCOUNTER — Ambulatory Visit (HOSPITAL_COMMUNITY): Payer: Medicare HMO

## 2020-07-29 DIAGNOSIS — Z933 Colostomy status: Secondary | ICD-10-CM | POA: Diagnosis not present

## 2020-08-04 ENCOUNTER — Telehealth: Payer: Self-pay

## 2020-08-04 NOTE — Telephone Encounter (Signed)
Cassandra Allen called requesting transportation services for her MRI appt and F.u appt.  Arrangements made through our transportation coordinator.

## 2020-08-05 ENCOUNTER — Ambulatory Visit (HOSPITAL_COMMUNITY)
Admission: RE | Admit: 2020-08-05 | Discharge: 2020-08-05 | Disposition: A | Discharge status: Home / Self Care | Payer: Medicare HMO | Source: Ambulatory Visit | Attending: Hematology | Admitting: Hematology

## 2020-08-05 DIAGNOSIS — C787 Secondary malignant neoplasm of liver and intrahepatic bile duct: Secondary | ICD-10-CM | POA: Diagnosis not present

## 2020-08-05 DIAGNOSIS — K8020 Calculus of gallbladder without cholecystitis without obstruction: Secondary | ICD-10-CM | POA: Diagnosis not present

## 2020-08-05 DIAGNOSIS — K5730 Diverticulosis of large intestine without perforation or abscess without bleeding: Secondary | ICD-10-CM | POA: Diagnosis not present

## 2020-08-05 DIAGNOSIS — Z85038 Personal history of other malignant neoplasm of large intestine: Secondary | ICD-10-CM | POA: Diagnosis not present

## 2020-08-05 DIAGNOSIS — C185 Malignant neoplasm of splenic flexure: Secondary | ICD-10-CM | POA: Insufficient documentation

## 2020-08-05 MED ORDER — GADOBUTROL 1 MMOL/ML IV SOLN
7.0000 mL | Freq: Once | INTRAVENOUS | Status: AC | PRN
  Administered 2020-08-05: 7 mL via INTRAVENOUS

## 2020-08-06 ENCOUNTER — Inpatient Hospital Stay: Payer: Medicare HMO | Admitting: Hematology

## 2020-08-11 ENCOUNTER — Inpatient Hospital Stay: Payer: Medicare HMO | Attending: Hematology | Admitting: Hematology

## 2020-08-11 ENCOUNTER — Other Ambulatory Visit: Payer: Self-pay

## 2020-08-11 ENCOUNTER — Encounter: Payer: Self-pay | Admitting: Hematology

## 2020-08-11 VITALS — BP 151/63 | HR 69 | Temp 97.8°F | Resp 18 | Ht 62.0 in | Wt 158.1 lb

## 2020-08-11 DIAGNOSIS — E669 Obesity, unspecified: Secondary | ICD-10-CM | POA: Diagnosis not present

## 2020-08-11 DIAGNOSIS — D120 Benign neoplasm of cecum: Secondary | ICD-10-CM | POA: Diagnosis not present

## 2020-08-11 DIAGNOSIS — H9202 Otalgia, left ear: Secondary | ICD-10-CM | POA: Diagnosis not present

## 2020-08-11 DIAGNOSIS — Z86010 Personal history of colonic polyps: Secondary | ICD-10-CM | POA: Insufficient documentation

## 2020-08-11 DIAGNOSIS — N281 Cyst of kidney, acquired: Secondary | ICD-10-CM | POA: Diagnosis not present

## 2020-08-11 DIAGNOSIS — E119 Type 2 diabetes mellitus without complications: Secondary | ICD-10-CM | POA: Insufficient documentation

## 2020-08-11 DIAGNOSIS — D122 Benign neoplasm of ascending colon: Secondary | ICD-10-CM | POA: Insufficient documentation

## 2020-08-11 DIAGNOSIS — I2510 Atherosclerotic heart disease of native coronary artery without angina pectoris: Secondary | ICD-10-CM | POA: Insufficient documentation

## 2020-08-11 DIAGNOSIS — K5730 Diverticulosis of large intestine without perforation or abscess without bleeding: Secondary | ICD-10-CM | POA: Diagnosis not present

## 2020-08-11 DIAGNOSIS — I700 Atherosclerosis of aorta: Secondary | ICD-10-CM | POA: Insufficient documentation

## 2020-08-11 DIAGNOSIS — C186 Malignant neoplasm of descending colon: Secondary | ICD-10-CM | POA: Insufficient documentation

## 2020-08-11 DIAGNOSIS — I482 Chronic atrial fibrillation, unspecified: Secondary | ICD-10-CM | POA: Diagnosis not present

## 2020-08-11 DIAGNOSIS — Z888 Allergy status to other drugs, medicaments and biological substances status: Secondary | ICD-10-CM | POA: Diagnosis not present

## 2020-08-11 DIAGNOSIS — I10 Essential (primary) hypertension: Secondary | ICD-10-CM | POA: Diagnosis not present

## 2020-08-11 DIAGNOSIS — Z7901 Long term (current) use of anticoagulants: Secondary | ICD-10-CM | POA: Insufficient documentation

## 2020-08-11 DIAGNOSIS — D124 Benign neoplasm of descending colon: Secondary | ICD-10-CM | POA: Diagnosis not present

## 2020-08-11 DIAGNOSIS — Z79899 Other long term (current) drug therapy: Secondary | ICD-10-CM | POA: Insufficient documentation

## 2020-08-11 DIAGNOSIS — D500 Iron deficiency anemia secondary to blood loss (chronic): Secondary | ICD-10-CM | POA: Diagnosis not present

## 2020-08-11 DIAGNOSIS — D509 Iron deficiency anemia, unspecified: Secondary | ICD-10-CM | POA: Insufficient documentation

## 2020-08-11 DIAGNOSIS — C185 Malignant neoplasm of splenic flexure: Secondary | ICD-10-CM

## 2020-08-11 DIAGNOSIS — Z933 Colostomy status: Secondary | ICD-10-CM | POA: Diagnosis not present

## 2020-08-11 DIAGNOSIS — C7889 Secondary malignant neoplasm of other digestive organs: Secondary | ICD-10-CM | POA: Diagnosis not present

## 2020-08-11 DIAGNOSIS — F329 Major depressive disorder, single episode, unspecified: Secondary | ICD-10-CM | POA: Insufficient documentation

## 2020-08-11 DIAGNOSIS — Z9049 Acquired absence of other specified parts of digestive tract: Secondary | ICD-10-CM | POA: Diagnosis not present
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
## 2020-08-27 ENCOUNTER — Inpatient Hospital Stay (HOSPITAL_COMMUNITY)
Admission: AD | Admit: 2020-08-27 | Discharge: 2020-09-03 | DRG: 445 | Disposition: A | Discharge status: Home Health | Payer: Medicare (Managed Care) | Source: Other Acute Inpatient Hospital | Attending: Internal Medicine | Admitting: Internal Medicine

## 2020-08-27 ENCOUNTER — Other Ambulatory Visit: Payer: Self-pay

## 2020-08-27 ENCOUNTER — Encounter: Payer: Self-pay | Admitting: Internal Medicine

## 2020-08-27 ENCOUNTER — Encounter (HOSPITAL_COMMUNITY): Payer: Self-pay | Admitting: Internal Medicine

## 2020-08-27 DIAGNOSIS — C185 Malignant neoplasm of splenic flexure: Secondary | ICD-10-CM | POA: Diagnosis present

## 2020-08-27 DIAGNOSIS — K831 Obstruction of bile duct: Secondary | ICD-10-CM | POA: Diagnosis not present

## 2020-08-27 DIAGNOSIS — E876 Hypokalemia: Secondary | ICD-10-CM | POA: Diagnosis present

## 2020-08-27 DIAGNOSIS — E119 Type 2 diabetes mellitus without complications: Secondary | ICD-10-CM | POA: Diagnosis present

## 2020-08-27 DIAGNOSIS — C187 Malignant neoplasm of sigmoid colon: Secondary | ICD-10-CM | POA: Diagnosis present

## 2020-08-27 DIAGNOSIS — Z888 Allergy status to other drugs, medicaments and biological substances status: Secondary | ICD-10-CM

## 2020-08-27 DIAGNOSIS — I10 Essential (primary) hypertension: Secondary | ICD-10-CM | POA: Diagnosis not present

## 2020-08-27 DIAGNOSIS — M1990 Unspecified osteoarthritis, unspecified site: Secondary | ICD-10-CM | POA: Diagnosis present

## 2020-08-27 DIAGNOSIS — I482 Chronic atrial fibrillation, unspecified: Secondary | ICD-10-CM | POA: Diagnosis present

## 2020-08-27 DIAGNOSIS — K8011 Calculus of gallbladder with chronic cholecystitis with obstruction: Principal | ICD-10-CM | POA: Diagnosis present

## 2020-08-27 DIAGNOSIS — C787 Secondary malignant neoplasm of liver and intrahepatic bile duct: Secondary | ICD-10-CM | POA: Diagnosis present

## 2020-08-27 DIAGNOSIS — I110 Hypertensive heart disease with heart failure: Secondary | ICD-10-CM | POA: Diagnosis present

## 2020-08-27 DIAGNOSIS — Z8249 Family history of ischemic heart disease and other diseases of the circulatory system: Secondary | ICD-10-CM

## 2020-08-27 DIAGNOSIS — Z79899 Other long term (current) drug therapy: Secondary | ICD-10-CM

## 2020-08-27 DIAGNOSIS — E7800 Pure hypercholesterolemia, unspecified: Secondary | ICD-10-CM

## 2020-08-27 DIAGNOSIS — D509 Iron deficiency anemia, unspecified: Secondary | ICD-10-CM | POA: Diagnosis present

## 2020-08-27 DIAGNOSIS — Z933 Colostomy status: Secondary | ICD-10-CM

## 2020-08-27 DIAGNOSIS — C186 Malignant neoplasm of descending colon: Secondary | ICD-10-CM | POA: Diagnosis present

## 2020-08-27 DIAGNOSIS — Z794 Long term (current) use of insulin: Secondary | ICD-10-CM

## 2020-08-27 DIAGNOSIS — E785 Hyperlipidemia, unspecified: Secondary | ICD-10-CM | POA: Diagnosis present

## 2020-08-27 DIAGNOSIS — I5032 Chronic diastolic (congestive) heart failure: Secondary | ICD-10-CM | POA: Diagnosis present

## 2020-08-27 DIAGNOSIS — I4821 Permanent atrial fibrillation: Secondary | ICD-10-CM | POA: Diagnosis present

## 2020-08-27 LAB — COMPREHENSIVE METABOLIC PANEL
ALT: 37 U/L (ref 0–44)
AST: 25 U/L (ref 15–41)
Albumin: 2.6 g/dL — ABNORMAL LOW (ref 3.5–5.0)
Alkaline Phosphatase: 615 U/L — ABNORMAL HIGH (ref 38–126)
Anion gap: 9 (ref 5–15)
BUN: 25 mg/dL — ABNORMAL HIGH (ref 8–23)
CO2: 24 mmol/L (ref 22–32)
Calcium: 8.1 mg/dL — ABNORMAL LOW (ref 8.9–10.3)
Chloride: 108 mmol/L (ref 98–111)
Creatinine, Ser: 0.83 mg/dL (ref 0.44–1.00)
GFR, Estimated: >60 mL/min (ref >60)
Glucose, Bld: 101 mg/dL — ABNORMAL HIGH (ref 70–99)
Potassium: 3.6 mmol/L (ref 3.5–5.1)
Sodium: 141 mmol/L (ref 135–145)
Total Bilirubin: 0.9 mg/dL (ref 0.3–1.2)
Total Protein: 5.4 g/dL — ABNORMAL LOW (ref 6.5–8.1)

## 2020-08-27 LAB — CBC
HCT: 31.1 % — ABNORMAL LOW (ref 36.0–46.0)
Hemoglobin: 9.5 g/dL — ABNORMAL LOW (ref 12.0–15.0)
MCH: 29.1 pg (ref 26.0–34.0)
MCHC: 30.5 g/dL (ref 30.0–36.0)
MCV: 95.4 fL (ref 80.0–100.0)
Platelets: 166 10*3/uL (ref 150–400)
RBC: 3.26 MIL/uL — ABNORMAL LOW (ref 3.87–5.11)
RDW: 14.6 % (ref 11.5–15.5)
WBC: 6.2 10*3/uL (ref 4.0–10.5)
nRBC: 0 % (ref 0.0–0.2)

## 2020-08-27 LAB — GLUCOSE, CAPILLARY
Glucose-Capillary: 91 mg/dL (ref 70–99)
Glucose-Capillary: 165 mg/dL — ABNORMAL HIGH (ref 70–99)

## 2020-08-27 MED ORDER — HYDROMORPHONE HCL 1 MG/ML IJ SOLN: 0.5000 mg | INTRAMUSCULAR | Status: DC | PRN

## 2020-08-27 MED ORDER — SIMVASTATIN 10 MG PO TABS
10.0000 mg | ORAL_TABLET | Freq: Every day | ORAL | Status: DC
  Administered 2020-08-27 – 2020-09-02 (×6): 10 mg via ORAL
  Filled 2020-08-27 (×7): qty 1

## 2020-08-27 MED ORDER — INSULIN GLARGINE 100 UNIT/ML ~~LOC~~ SOLN
12.0000 [IU] | Freq: Every day | SUBCUTANEOUS | Status: DC
  Administered 2020-08-27 – 2020-09-02 (×7): 12 [IU] via SUBCUTANEOUS
  Filled 2020-08-27 (×8): qty 0.12

## 2020-08-27 MED ORDER — SERTRALINE HCL 100 MG PO TABS
100.0000 mg | ORAL_TABLET | Freq: Every day | ORAL | Status: DC
  Administered 2020-08-28 – 2020-09-03 (×7): 100 mg via ORAL
  Filled 2020-08-27 (×7): qty 1

## 2020-08-27 MED ORDER — FERROUS SULFATE 325 (65 FE) MG PO TABS
325.0000 mg | ORAL_TABLET | Freq: Every day | ORAL | Status: DC
  Administered 2020-09-03: 325 mg via ORAL
  Filled 2020-08-27 (×4): qty 1

## 2020-08-27 MED ORDER — BOOST / RESOURCE BREEZE PO LIQD CUSTOM
1.0000 | Freq: Three times a day (TID) | ORAL | Status: DC
  Administered 2020-08-27 – 2020-09-03 (×5): 1 via ORAL

## 2020-08-27 MED ORDER — TRAMADOL HCL 50 MG PO TABS
50.0000 mg | ORAL_TABLET | Freq: Three times a day (TID) | ORAL | Status: DC | PRN
  Administered 2020-08-27 – 2020-08-31 (×3): 50 mg via ORAL
  Filled 2020-08-27 (×4): qty 1

## 2020-08-27 MED ORDER — QUETIAPINE FUMARATE 25 MG PO TABS
12.5000 mg | ORAL_TABLET | Freq: Every day | ORAL | Status: DC
  Administered 2020-08-27 – 2020-09-02 (×7): 12.5 mg via ORAL
  Filled 2020-08-27 (×7): qty 1

## 2020-08-27 MED ORDER — HEPARIN SODIUM (PORCINE) 5000 UNIT/ML IJ SOLN
5000.0000 [IU] | Freq: Three times a day (TID) | INTRAMUSCULAR | Status: DC
  Administered 2020-08-27 – 2020-09-03 (×19): 5000 [IU] via SUBCUTANEOUS
  Filled 2020-08-27 (×20): qty 1

## 2020-08-27 MED ORDER — OXYBUTYNIN CHLORIDE ER 5 MG PO TB24
10.0000 mg | ORAL_TABLET | Freq: Every day | ORAL | Status: DC
  Administered 2020-08-28 – 2020-09-03 (×7): 10 mg via ORAL
  Filled 2020-08-27 (×7): qty 2

## 2020-08-27 MED ORDER — INSULIN ASPART 100 UNIT/ML ~~LOC~~ SOLN
0.0000 [IU] | Freq: Three times a day (TID) | SUBCUTANEOUS | Status: DC
  Administered 2020-08-28: 2 [IU] via SUBCUTANEOUS
  Administered 2020-08-29 (×2): 1 [IU] via SUBCUTANEOUS
  Administered 2020-08-30 – 2020-08-31 (×3): 2 [IU] via SUBCUTANEOUS
  Administered 2020-09-01 – 2020-09-02 (×3): 1 [IU] via SUBCUTANEOUS
  Administered 2020-09-02 – 2020-09-03 (×2): 2 [IU] via SUBCUTANEOUS

## 2020-08-27 MED ORDER — DIPHENHYDRAMINE HCL 25 MG PO CAPS
25.0000 mg | ORAL_CAPSULE | Freq: Once | ORAL | Status: AC
  Administered 2020-08-27: 25 mg via ORAL
  Filled 2020-08-27: qty 1

## 2020-08-27 MED ORDER — METOPROLOL TARTRATE 25 MG PO TABS
12.5000 mg | ORAL_TABLET | Freq: Two times a day (BID) | ORAL | Status: DC
  Administered 2020-08-27 – 2020-09-03 (×14): 12.5 mg via ORAL
  Filled 2020-08-27 (×14): qty 1

## 2020-08-27 MED ORDER — AMLODIPINE BESYLATE 5 MG PO TABS
5.0000 mg | ORAL_TABLET | Freq: Every day | ORAL | Status: DC
  Administered 2020-08-27 – 2020-09-03 (×8): 5 mg via ORAL
  Filled 2020-08-27 (×8): qty 1

## 2020-08-27 MED ORDER — HYDROMORPHONE HCL 1 MG/ML IJ SOLN
0.5000 mg | Freq: Once | INTRAMUSCULAR | Status: AC
  Administered 2020-08-27: 0.5 mg via INTRAVENOUS
  Filled 2020-08-27: qty 0.5

## 2020-08-27 MED ORDER — ONDANSETRON HCL 4 MG/2ML IJ SOLN
4.0000 mg | Freq: Four times a day (QID) | INTRAMUSCULAR | Status: DC | PRN
  Administered 2020-08-29 – 2020-09-02 (×4): 4 mg via INTRAVENOUS
  Filled 2020-08-27 (×4): qty 2

## 2020-08-27 MED ORDER — ONDANSETRON HCL 4 MG PO TABS
4.0000 mg | ORAL_TABLET | Freq: Four times a day (QID) | ORAL | Status: DC | PRN
  Filled 2020-08-27 (×2): qty 1

## 2020-08-27 NOTE — H&P (Addendum)
History and Physical        Hospital Admission Note Date: 08/27/2020  Patient name: Cassandra Allen [facility] Medical record number: [id number] Date of birth: 05-29-1946 Age: 74 y.o. Gender: female  PCP: [name], MD  Patient coming from: Home   Chief Complaint    No chief complaint on file.     HPI:   This is a 74 year old female with a history of metastatic colon cancer with obstruction s/p left sigmoid colectomy and colostomy (12/27/18) who follows with Dr. Burr Medico, HFpEF, hypertension, hyperlipidemia, diabetes, permanent Atrial fibrillation on amiodarone/digoxin/eliquis who presented to [facility] for abdominal pain. She was found to have an elevated alk phos and underwent an MRCP which showed obstruction of the common duct due to central hepatic metastases at the hilum with intrahepatic biliary dilitation and cystic duct distension as well as no gallbladder wall thickening but did show gallstones. HIDA scan was abnormal and compatible with chronic cholecystitis (results and notes in Care Everywhere). She was seen by GI and surgery who recommended either IR drain or stent placement, however [facility] does not have these interventions available. They were going to transfer to [facility], however, the patient requested to be transferred to [facility] as she is already established with this hospital system.   Currently the patient states that she is starting to have recurrence of her abdominal pain most notably across her upper abdomen.  Denies any nausea or vomiting.  No other complaints currently and is tolerating her clear liquid diet.   Vitals:   08/27/20 1411  BP: (!) 162/58  Pulse: (!) 50  Resp: 17  Temp: 97.8 F (36.6 C)  SpO2: 100%     Review of Systems:  Review of Systems  Constitutional: Negative for chills and fever.  Respiratory:  Negative for shortness of breath and wheezing.   Cardiovascular: Negative for chest pain and palpitations.  Gastrointestinal: Positive for abdominal pain. Negative for nausea and vomiting.  Genitourinary: Negative.   Musculoskeletal: Negative.   All other systems reviewed and are negative.   Medical/Social/Family History   Past Medical History: Past Medical History:  Diagnosis Date  . Acute diastolic heart failure ([place]) 12/22/2018  . Arthritis   . Atrial fibrillation, chronic ([place]) 12/22/2018  . Cancer of left colon ([place]) 10/30/2018  . Cancer of sigmoid colon  12/27/2018  . Diabetes mellitus without complication ([place])   . Hypertension   . Obesity (BMI 30-39.9) 12/27/2018    Past Surgical History:  Procedure Laterality Date  . COLONOSCOPY  10/2018   Dr [name].  Large cancer at splenic flexure,  Bulky sigmoid colon mass, Numerous polyps  . Intra-abdominal abscess drainage  01/31/2019   Abscess drainage grew Enterococcus faecalis  . IR CATHETER TUBE CHANGE  06/05/2019  . IR IMAGING GUIDED PORT INSERTION  11/20/2018  . IR RADIOLOGIST EVAL & MGMT  06/04/2019  . IR RADIOLOGIST EVAL & MGMT  06/17/2019  . IR RADIOLOGIST EVAL & MGMT  09/16/2019  . LAPAROTOMY N/A 12/27/2018   Procedure: LEFT SIGMOID COLECTOMY WITH HARTMANN POUCH AND END COLOSTOMY;  Surgeon: [name], MD;  Location: [facility];  Service: General;  Laterality: N/A;    Medications: Prior to Admission medications  Medication Sig Start Date End Date Taking? Authorizing Provider  amLODipine (NORVASC) 5 MG tablet Take 1 tablet by mouth daily. 08/21/19   [provider]  ferrous sulfate (KP FERROUS SULFATE) 325 (65 FE) MG tablet Take 1 tablet (325 mg total) by mouth daily with breakfast. 03/20/19   Hongalgi, Lenis Dickinson, MD  hydrocortisone 1 % lotion Apply 1 application topically 2 (two) times daily. 06/21/20   Truitt Merle, MD  Insulin Detemir (LEVEMIR) 100 UNIT/ML Pen Inject 16 Units into the skin daily. 03/21/19   Hongalgi, Lenis Dickinson, MD    lidocaine-prilocaine (EMLA) cream  10/23/19   [provider]  liraglutide (VICTOZA) 18 MG/3ML SOPN Inject 0.3 mLs (1.8 mg total) into the skin every morning. 03/20/19   Hongalgi, Lenis Dickinson, MD  metoprolol tartrate (LOPRESSOR) 25 MG tablet Take 1 tablet by mouth daily. 12/02/19   [provider]  ondansetron (ZOFRAN) 8 MG tablet Take 1 tablet by mouth every 8 (eight) hours as needed for nausea.    [provider]  oxybutynin (DITROPAN-XL) 10 MG 24 hr tablet Take 1 tablet (10 mg total) by mouth daily. 03/20/19   Hongalgi, Lenis Dickinson, MD  pantoprazole (PROTONIX) 40 MG tablet Take 1 tablet (40 mg total) by mouth daily for 30 days. 03/20/19 04/19/19  Modena Jansky, MD  QUEtiapine (SEROQUEL) 25 MG tablet  02/04/20   [provider]  sertraline (ZOLOFT) 50 MG tablet Take 3 tablets (150 mg total) by mouth daily. 06/21/20   Truitt Merle, MD  simvastatin (ZOCOR) 10 MG tablet Take 1 tablet (10 mg total) by mouth daily at 6 PM. Patient taking differently: Take 20 mg by mouth daily at 6 PM.  03/20/19   Hongalgi, Lenis Dickinson, MD    Allergies:   Allergies  Allergen Reactions  . Metformin And Related Nausea And Vomiting  . Prednisone Nausea And Vomiting    Social History:  reports that she has never smoked. She has never used smokeless tobacco. She reports that she does not drink alcohol and does not use drugs.  Family History: Family History  Problem Relation Age of Onset  . Heart attack Mother   . Heart attack Father      Objective   Physical Exam: Blood pressure (!) 162/58, pulse (!) 50, temperature 97.8 F (36.6 C), temperature source Oral, resp. rate 17, height 5' 2"  (1.575 m), SpO2 100 %.  Physical Exam Vitals and nursing note reviewed.  Constitutional:      Appearance: Normal appearance.  HENT:     Head: Normocephalic and atraumatic.  Eyes:     Conjunctiva/sclera: Conjunctivae normal.  Cardiovascular:     Rate and Rhythm: Normal rate. Rhythm irregular.   Pulmonary:     Effort: Pulmonary effort is normal.     Breath sounds: Normal breath sounds.  Abdominal:     General: Abdomen is flat.     Palpations: Abdomen is soft.     Comments: Colostomy bag in place without any erythema surrounding  Musculoskeletal:        General: No swelling or tenderness.  Skin:    Coloration: Skin is not jaundiced or pale.  Neurological:     Mental Status: She is alert. Mental status is at baseline.  Psychiatric:        Mood and Affect: Mood normal.        Behavior: Behavior normal.     LABS on Admission: I have personally reviewed all the labs and imaging below    Basic  Metabolic Panel: No results for input(s): NA, K, CL, CO2, GLUCOSE, BUN, CREATININE, CALCIUM, MG, PHOS in the last 168 hours. Liver Function Tests: No results for input(s): AST, ALT, ALKPHOS, BILITOT, PROT, ALBUMIN in the last 168 hours. No results for input(s): LIPASE, AMYLASE in the last 168 hours. No results for input(s): AMMONIA in the last 168 hours. CBC: No results for input(s): WBC, NEUTROABS, HGB, HCT, MCV, PLT in the last 168 hours. Cardiac Enzymes: No results for input(s): CKTOTAL, CKMB, CKMBINDEX, TROPONINI in the last 168 hours. BNP: Invalid input(s): POCBNP CBG: No results for input(s): GLUCAP in the last 168 hours.  Radiological Exams on Admission:  No results found.    EKG: Not done, will order   A & P   Principal Problem:   Biliary obstruction Active Problems:   Essential (primary) hypertension   HLD (hyperlipidemia)   Atrial fibrillation, chronic (HCC)   Cancer of sigmoid colon    1. Biliary obstruction secondary to hepatic mets in the setting of known metastatic colon cancer with history of obstruction s/p left sigmoid colectomy and colostomy (12/27/2018, follows with Dr. Burr Medico), stable a. MRCP (in Care Everywhere) obstruction of the common duct due to central hepatic metastases at the hilum with intrahepatic biliary dilitation and cystic duct  distension as well as no gallbladder wall thickening but did show gallstones. HIDA scan was abnormal and compatible with chronic cholecystitis  b. Seems to be doing well at the moment c. GI consulted, discussed with Dr. Alessandra Bevels who recommended NPO after midnight d. Follow up labs   2. Permanent Atrial fibrillation, rate controlled a. Prior notes suggest the patient is on amiodarone, eliquis and digoxin but this is not on her med list. Will confirm with pharmacy i. Addendum: per pharmacy: Patient said her cardiologist took her off Eliquis and as per her daughter-in-law she has not been taking amiodarone, digoxin or Eliquis  b. EKG for baseline  3. Hypertension a. Continue home Lopressor  4. Hyperlipidemia a. Continue home statin   DVT prophylaxis: Heparin   Code Status: Prior  Diet: Clear liquid diet, n.p.o. after midnight Family Communication: Admission, patients condition and plan of care including tests being ordered have been discussed with the patient who indicates understanding and agrees with the plan and Code Status.  Disposition Plan: The appropriate patient status for this patient is OBSERVATION. Observation status is judged to be reasonable and necessary in order to provide the required intensity of service to ensure the patient's safety. The patient's presenting symptoms, physical exam findings, and initial radiographic and laboratory data in the context of their medical condition is felt to place them at decreased risk for further clinical deterioration. Furthermore, it is anticipated that the patient will be medically stable for discharge from the hospital within 2 midnights of admission. The following factors support the patient status of observation.   " The patient's presenting symptoms include abdominal pain. " The physical exam findings include abdominal discomfort. " The initial radiographic and laboratory data are pending but concerning for biliary obstruction.      Status is: Observation  The patient remains OBS appropriate and will d/c before 2 midnights.  Dispo: The patient is from: Home              Anticipated d/c is to: Home              Anticipated d/c date is: 1 day              Patient currently is  not medically stable to d/c.      Consultants  . GI  Procedures  . None  Time Spent on Admission: 60 minutes    Harold Hedge, DO [facility]  08/27/2020, 4:53 PM

## 2020-08-27 NOTE — Plan of Care (Signed)

## 2020-08-28 ENCOUNTER — Encounter (HOSPITAL_COMMUNITY): Payer: Self-pay | Admitting: Internal Medicine

## 2020-08-28 DIAGNOSIS — K831 Obstruction of bile duct: Secondary | ICD-10-CM | POA: Diagnosis not present

## 2020-08-28 LAB — COMPREHENSIVE METABOLIC PANEL
ALT: 31 U/L (ref 0–44)
AST: 23 U/L (ref 15–41)
Albumin: 2.4 g/dL — ABNORMAL LOW (ref 3.5–5.0)
Alkaline Phosphatase: 594 U/L — ABNORMAL HIGH (ref 38–126)
Anion gap: 11 (ref 5–15)
BUN: 18 mg/dL (ref 8–23)
CO2: 25 mmol/L (ref 22–32)
Calcium: 7.8 mg/dL — ABNORMAL LOW (ref 8.9–10.3)
Chloride: 106 mmol/L (ref 98–111)
Creatinine, Ser: 0.73 mg/dL (ref 0.44–1.00)
GFR, Estimated: >60 mL/min (ref >60)
Glucose, Bld: 81 mg/dL (ref 70–99)
Potassium: 3.2 mmol/L — ABNORMAL LOW (ref 3.5–5.1)
Sodium: 142 mmol/L (ref 135–145)
Total Bilirubin: 0.9 mg/dL (ref 0.3–1.2)
Total Protein: 5.2 g/dL — ABNORMAL LOW (ref 6.5–8.1)

## 2020-08-28 LAB — CBC
HCT: 32.2 % — ABNORMAL LOW (ref 36.0–46.0)
Hemoglobin: 9.9 g/dL — ABNORMAL LOW (ref 12.0–15.0)
MCH: 29.5 pg (ref 26.0–34.0)
MCHC: 30.7 g/dL (ref 30.0–36.0)
MCV: 95.8 fL (ref 80.0–100.0)
Platelets: 178 10*3/uL (ref 150–400)
RBC: 3.36 MIL/uL — ABNORMAL LOW (ref 3.87–5.11)
RDW: 14.3 % (ref 11.5–15.5)
WBC: 5.5 10*3/uL (ref 4.0–10.5)
nRBC: 0 % (ref 0.0–0.2)

## 2020-08-28 LAB — GLUCOSE, CAPILLARY
Glucose-Capillary: 78 mg/dL (ref 70–99)
Glucose-Capillary: 81 mg/dL (ref 70–99)
Glucose-Capillary: 169 mg/dL — ABNORMAL HIGH (ref 70–99)
Glucose-Capillary: 160 mg/dL — ABNORMAL HIGH (ref 70–99)

## 2020-08-28 MED ORDER — CHLORHEXIDINE GLUCONATE CLOTH 2 % EX PADS
6.0000 | MEDICATED_PAD | Freq: Every day | CUTANEOUS | Status: DC
  Administered 2020-08-28 – 2020-09-03 (×4): 6 via TOPICAL

## 2020-08-28 MED ORDER — HYDROMORPHONE HCL 1 MG/ML IJ SOLN
1.0000 mg | INTRAMUSCULAR | Status: DC | PRN
  Administered 2020-08-28 – 2020-08-31 (×5): 1 mg via INTRAVENOUS
  Filled 2020-08-28 (×5): qty 1

## 2020-08-28 MED ORDER — POTASSIUM CHLORIDE CRYS ER 20 MEQ PO TBCR
40.0000 meq | EXTENDED_RELEASE_TABLET | Freq: Once | ORAL | Status: AC
  Administered 2020-08-28: 40 meq via ORAL
  Filled 2020-08-28: qty 2

## 2020-08-28 MED ORDER — HYDROXYZINE HCL 25 MG PO TABS
25.0000 mg | ORAL_TABLET | Freq: Two times a day (BID) | ORAL | Status: DC | PRN
  Administered 2020-08-28 – 2020-08-31 (×3): 25 mg via ORAL
  Filled 2020-08-28 (×3): qty 1

## 2020-08-28 NOTE — Consult Note (Signed)
Reason for Consult: Abnormal MRI Referring Physician: Hospital team  Cassandra Allen is an 74 y.o. female.  HPI: Patient seen and examined in her hospital computer chart reviewed and she was fine until Thursday when she had increased upper abdominal pain across her whole abdomen and on Friday presented to an outlying hospital where there were some concerns about her liver mets and her gallbladder and her work-up at the other hospital was reviewed and she did not have any fever or vomiting and is she is feeling better today and tolerated clear liquids last night and has no other complaints  Past Medical History:  Diagnosis Date  . Acute diastolic heart failure ([place]) 12/22/2018  . Arthritis   . Atrial fibrillation, chronic ([place]) 12/22/2018  . Cancer of left colon ([place]) 10/30/2018  . Cancer of sigmoid colon  12/27/2018  . Diabetes mellitus without complication ([place])   . Hypertension   . Obesity (BMI 30-39.9) 12/27/2018    Past Surgical History:  Procedure Laterality Date  . COLONOSCOPY  10/2018   Dr [name].  Large cancer at splenic flexure,  Bulky sigmoid colon mass, Numerous polyps  . Intra-abdominal abscess drainage  01/31/2019   Abscess drainage grew Enterococcus faecalis  . IR CATHETER TUBE CHANGE  06/05/2019  . IR IMAGING GUIDED PORT INSERTION  11/20/2018  . IR RADIOLOGIST EVAL & MGMT  06/04/2019  . IR RADIOLOGIST EVAL & MGMT  06/17/2019  . IR RADIOLOGIST EVAL & MGMT  09/16/2019  . LAPAROTOMY N/A 12/27/2018   Procedure: LEFT SIGMOID COLECTOMY WITH HARTMANN POUCH AND END COLOSTOMY;  Surgeon: [name], MD;  Location: [facility];  Service: General;  Laterality: N/A;    Family History  Problem Relation Age of Onset  . Heart attack Mother   . Heart attack Father     Social History:  reports that she has never smoked. She has never used smokeless tobacco. She reports that she does not drink alcohol and does not use drugs.  Allergies:  Allergies  Allergen Reactions  . Metformin And  Related Nausea And Vomiting  . Prednisone Nausea And Vomiting    Medications: I have reviewed the patient's current medications.  Results for orders placed or performed during the hospital encounter of 08/27/20 (from the past 48 hour(s))  Glucose, capillary     Status: None   Collection Time: 08/27/20  5:28 PM  Result Value Ref Range   Glucose-Capillary 91 70 - 99 mg/dL    Comment: Glucose reference range applies only to samples taken after fasting for at least 8 hours.  CBC     Status: Abnormal   Collection Time: 08/27/20  6:47 PM  Result Value Ref Range   WBC 6.2 4.0 - 10.5 K/uL   RBC 3.26 (L) 3.87 - 5.11 MIL/uL   Hemoglobin 9.5 (L) 12.0 - 15.0 g/dL   HCT 31.1 (L) 36 - 46 %   MCV 95.4 80.0 - 100.0 fL   MCH 29.1 26.0 - 34.0 pg   MCHC 30.5 30.0 - 36.0 g/dL   RDW 14.6 11.5 - 15.5 %   Platelets 166 150 - 400 K/uL   nRBC 0.0 0.0 - 0.2 %    Comment: Performed at [facility] [address] [place], [place] [address]  Comprehensive metabolic panel     Status: Abnormal   Collection Time: 08/27/20  6:47 PM  Result Value Ref Range   Sodium 141 135 - 145 mmol/L   Potassium 3.6 3.5 - 5.1 mmol/L  Chloride 108 98 - 111 mmol/L   CO2 24 22 - 32 mmol/L   Glucose, Bld 101 (H) 70 - 99 mg/dL    Comment: Glucose reference range applies only to samples taken after fasting for at least 8 hours.   BUN 25 (H) 8 - 23 mg/dL   Creatinine, Ser 0.83 0.44 - 1.00 mg/dL   Calcium 8.1 (L) 8.9 - 10.3 mg/dL   Total Protein 5.4 (L) 6.5 - 8.1 g/dL   Albumin 2.6 (L) 3.5 - 5.0 g/dL   AST 25 15 - 41 U/L   ALT 37 0 - 44 U/L   Alkaline Phosphatase 615 (H) 38 - 126 U/L   Total Bilirubin 0.9 0.3 - 1.2 mg/dL   GFR, Estimated >60 >60 mL/min   Anion gap 9 5 - 15    Comment: Performed at [facility], [place] [address] [place], [place] [address]  Glucose, capillary     Status: Abnormal   Collection Time: 08/27/20 10:26 PM  Result Value Ref Range   Glucose-Capillary 165 (H)  70 - 99 mg/dL    Comment: Glucose reference range applies only to samples taken after fasting for at least 8 hours.  Glucose, capillary     Status: None   Collection Time: 08/28/20  7:57 AM  Result Value Ref Range   Glucose-Capillary 78 70 - 99 mg/dL    Comment: Glucose reference range applies only to samples taken after fasting for at least 8 hours.  Glucose, capillary     Status: None   Collection Time: 08/28/20 11:41 AM  Result Value Ref Range   Glucose-Capillary 81 70 - 99 mg/dL    Comment: Glucose reference range applies only to samples taken after fasting for at least 8 hours.    No results found.  Review of Systems negative except above Blood pressure (!) 122/98, pulse (!) 53, temperature 98 F (36.7 C), temperature source Oral, resp. rate 14, height 5' 2"  (1.575 m), weight 68.8 kg, SpO2 96 %. Physical Exam Exam pertinent for her abdomen being soft nontender MRI report reviewed as well as nuclear medicine HIDA scan and labs okay except for increased alk phos Assessment/Plan: Metastatic colon cancer with currently resolved abdominal pain questionable due to gallbladder but MRI does not sound like liver met is larger Plan: We will allow soft solids and see how she does and consider surgical consult for possible cholecystectomy and would wait on oncology opinion at the current time to see if chemotherapy or radiation is needed and happy to proceed with ERCP and that procedure was discussed with her including stenting and the risks if oncology feels it is needed at this time or on follow-up  [facility] E 08/28/2020, 11:53 AM

## 2020-08-28 NOTE — Progress Notes (Addendum)
PROGRESS NOTE  CARENA STREAM TMA:[id number] DOB: 1946-06-14 DOA: 08/27/2020 PCP: [name], MD  HPI/Recap of past 61 hours: 74 year old female with metastatic colon cancer with obstruction status post left sigmoid colectomy who was transferred from another hospital due to requiring higher comprehensive intervention surgery  August 28, 2020: Patient has been seen and examined today she is doing much better she has been n.p.o. when she feels hungry and would like to eat.  She was n.p.o. for possible stents but GI has advised her to start soft fluid liquid and they have ordered for possible ERCP  Assessment/Plan: Principal Problem:   Biliary obstruction Active Problems:   Essential (primary) hypertension   HLD (hyperlipidemia)   Atrial fibrillation, chronic (HCC)   Cancer of sigmoid colon   #1 metastatic colon cancer. Waiting on oncology opinion to direct therapy.  GI is following likely to have ERCP  2.  Hypertension improved continue current medications  3.  History of permanent atrial fibrillation rate is controlled.  Patient is currently not on medication for her A. fib  4.  Type 2 diabetes mellitus stable continue home medicine  5.  Hypokalemia potassium is 3.2 today I will replace and recheck in the morning  Code Status: Full  Severity of Illness: The appropriate patient status for this patient is inpatient.  Inpatient status is judged to be reasonable and necessary in order to provide the required intensity of service to ensure the patient's safety. The patient's presenting symptoms, physical exam findings, and initial radiographic and laboratory data in the context of their medical condition is felt to place them at decreased risk for further clinical deterioration. Furthermore, it is anticipated that the patient will be medically stable for discharge from the hospital within 2 midnights of admission. The following factors support the patient status of  observation.   " The patient's presenting symptoms include abdominal pain. " The physical exam findings include abdominal tender " The initial radiographic and laboratory data are  MRCP which showed obstruction of the common duct due to central hepatic metastases at the hilum with intrahepatic biliary dilitation and cystic duct distension as well as no gallbladder wall thickening but did show gallstones.      Family Communication: Patient  Disposition Plan: Per oncology and GI  Consultants:  GI  Procedures:  MRCP  Antimicrobials:  None  DVT prophylaxis: Heparin subcu at   Objective: Vitals:   08/27/20 1600 08/27/20 1849 08/28/20 0234 08/28/20 0538  BP:  (!) 156/67 133/63 (!) 122/98  Pulse:  (!) 53 (!) 56 (!) 53  Resp:  17 14 14   Temp:  98 F (36.7 C) 97.9 F (36.6 C) 98 F (36.7 C)  TempSrc:  Oral Oral Oral  SpO2:  97% 95% 96%  Weight: 68.8 kg     Height:        Intake/Output Summary (Last 24 hours) at 08/28/2020 1008 Last data filed at 08/28/2020 0556 Gross per 24 hour  Intake --  Output 400 ml  Net -400 ml   Filed Weights   08/27/20 1600  Weight: 68.8 kg   Body mass index is 27.74 kg/m.  Exam:   General: 74 y.o. year-old female well developed well nourished in no acute distress.  Alert and oriented x3.  Cardiovascular: Regular rate and rhythm with no rubs or gallops.  No thyromegaly or JVD noted.    Respiratory: Clear to auscultation with no wheezes or rales. Good inspiratory effort.  Abdomen: Soft nontender nondistended with normal bowel  sounds x4 quadrants.  Musculoskeletal: No lower extremity edema. 2/4 pulses in all 4 extremities.  Skin: No ulcerative lesions noted or rashes,  Psychiatry: Mood is appropriate for condition and setting    Data Reviewed: CBC: Recent Labs  Lab 08/27/20 1847  WBC 6.2  HGB 9.5*  HCT 31.1*  MCV 95.4  PLT 509   Basic Metabolic Panel: Recent Labs  Lab 08/27/20 1847  NA 141  K 3.6  CL 108  CO2  24  GLUCOSE 101*  BUN 25*  CREATININE 0.83  CALCIUM 8.1*   GFR: Estimated Creatinine Clearance: 54.9 mL/min (by C-G formula based on SCr of 0.83 mg/dL). Liver Function Tests: Recent Labs  Lab 08/27/20 1847  AST 25  ALT 37  ALKPHOS 615*  BILITOT 0.9  PROT 5.4*  ALBUMIN 2.6*   No results for input(s): LIPASE, AMYLASE in the last 168 hours. No results for input(s): AMMONIA in the last 168 hours. Coagulation Profile: No results for input(s): INR, PROTIME in the last 168 hours. Cardiac Enzymes: No results for input(s): CKTOTAL, CKMB, CKMBINDEX, TROPONINI in the last 168 hours. BNP (last 3 results) No results for input(s): PROBNP in the last 8760 hours. HbA1C: No results for input(s): HGBA1C in the last 72 hours. CBG: Recent Labs  Lab 08/27/20 1728 08/27/20 2226 08/28/20 0757  GLUCAP 91 165* 78   Lipid Profile: No results for input(s): CHOL, HDL, LDLCALC, TRIG, CHOLHDL, LDLDIRECT in the last 72 hours. Thyroid Function Tests: No results for input(s): TSH, T4TOTAL, FREET4, T3FREE, THYROIDAB in the last 72 hours. Anemia Panel: No results for input(s): VITAMINB12, FOLATE, FERRITIN, TIBC, IRON, RETICCTPCT in the last 72 hours. Urine analysis:    Component Value Date/Time   COLORURINE AMBER (A) 03/10/2019 0041   APPEARANCEUR CLEAR 03/10/2019 0041   LABSPEC 1.032 (H) 03/10/2019 0041   PHURINE 5.0 03/10/2019 0041   GLUCOSEU NEGATIVE 03/10/2019 0041   HGBUR SMALL (A) 03/10/2019 0041   BILIRUBINUR NEGATIVE 03/10/2019 0041   KETONESUR NEGATIVE 03/10/2019 0041   PROTEINUR 30 (A) 03/10/2019 0041   UROBILINOGEN 0.2 03/25/2015 2317   NITRITE POSITIVE (A) 03/10/2019 0041   LEUKOCYTESUR MODERATE (A) 03/10/2019 0041   Sepsis Labs: @LABRCNTIP (procalcitonin:4,lacticidven:4)  )No results found for this or any previous visit (from the past 240 hour(s)).    Studies: No results found.  Scheduled Meds:  amLODipine  5 mg Oral Daily   Chlorhexidine Gluconate Cloth  6 each  Topical Daily   feeding supplement  1 Container Oral TID BM   ferrous sulfate  325 mg Oral Q breakfast   heparin  5,000 Units Subcutaneous Q8H   insulin aspart  0-9 Units Subcutaneous TID WC   insulin glargine  12 Units Subcutaneous QHS   metoprolol tartrate  12.5 mg Oral BID   oxybutynin  10 mg Oral Daily   QUEtiapine  12.5 mg Oral QHS   sertraline  100 mg Oral Daily   simvastatin  10 mg Oral q1800    Continuous Infusions:   LOS: 0 days     Cristal Deer, MD [facility]  To reach me or the doctor on call, go to: [email or web address] Password [email or web address]  08/28/2020, 10:08 AM

## 2020-08-29 DIAGNOSIS — E782 Mixed hyperlipidemia: Secondary | ICD-10-CM | POA: Diagnosis not present

## 2020-08-29 DIAGNOSIS — K831 Obstruction of bile duct: Secondary | ICD-10-CM | POA: Diagnosis not present

## 2020-08-29 DIAGNOSIS — I10 Essential (primary) hypertension: Secondary | ICD-10-CM | POA: Diagnosis not present

## 2020-08-29 DIAGNOSIS — C187 Malignant neoplasm of sigmoid colon: Secondary | ICD-10-CM | POA: Diagnosis not present

## 2020-08-29 LAB — COMPREHENSIVE METABOLIC PANEL
ALT: 23 U/L (ref 0–44)
AST: 20 U/L (ref 15–41)
Albumin: 1.9 g/dL — ABNORMAL LOW (ref 3.5–5.0)
Alkaline Phosphatase: 535 U/L — ABNORMAL HIGH (ref 38–126)
Anion gap: 9 (ref 5–15)
BUN: 13 mg/dL (ref 8–23)
CO2: 17 mmol/L — ABNORMAL LOW (ref 22–32)
Calcium: 6 mg/dL — CL (ref 8.9–10.3)
Chloride: 112 mmol/L — ABNORMAL HIGH (ref 98–111)
Creatinine, Ser: 0.57 mg/dL (ref 0.44–1.00)
GFR, Estimated: >60 mL/min (ref >60)
Glucose, Bld: 109 mg/dL — ABNORMAL HIGH (ref 70–99)
Potassium: 2.8 mmol/L — ABNORMAL LOW (ref 3.5–5.1)
Sodium: 138 mmol/L (ref 135–145)
Total Bilirubin: 0.8 mg/dL (ref 0.3–1.2)
Total Protein: 4.1 g/dL — ABNORMAL LOW (ref 6.5–8.1)

## 2020-08-29 LAB — CBC WITH DIFFERENTIAL/PLATELET
Abs Immature Granulocytes: 0.09 10*3/uL — ABNORMAL HIGH (ref 0.00–0.07)
Basophils Absolute: 0 10*3/uL (ref 0.0–0.1)
Basophils Relative: 1 %
Eosinophils Absolute: 0.1 10*3/uL (ref 0.0–0.5)
Eosinophils Relative: 1 %
HCT: 32.1 % — ABNORMAL LOW (ref 36.0–46.0)
Hemoglobin: 10 g/dL — ABNORMAL LOW (ref 12.0–15.0)
Immature Granulocytes: 2 %
Lymphocytes Relative: 6 %
Lymphs Abs: 0.3 10*3/uL — ABNORMAL LOW (ref 0.7–4.0)
MCH: 29.7 pg (ref 26.0–34.0)
MCHC: 31.2 g/dL (ref 30.0–36.0)
MCV: 95.3 fL (ref 80.0–100.0)
Monocytes Absolute: 0.6 10*3/uL (ref 0.1–1.0)
Monocytes Relative: 11 %
Neutro Abs: 4.5 10*3/uL (ref 1.7–7.7)
Neutrophils Relative %: 79 %
Platelets: 173 10*3/uL (ref 150–400)
RBC: 3.37 MIL/uL — ABNORMAL LOW (ref 3.87–5.11)
RDW: 14.4 % (ref 11.5–15.5)
WBC: 5.7 10*3/uL (ref 4.0–10.5)
nRBC: 0 % (ref 0.0–0.2)

## 2020-08-29 LAB — BASIC METABOLIC PANEL
Anion gap: 11 (ref 5–15)
BUN: 21 mg/dL (ref 8–23)
CO2: 23 mmol/L (ref 22–32)
Calcium: 8.2 mg/dL — ABNORMAL LOW (ref 8.9–10.3)
Chloride: 105 mmol/L (ref 98–111)
Creatinine, Ser: 0.93 mg/dL (ref 0.44–1.00)
GFR, Estimated: >60 mL/min (ref >60)
Glucose, Bld: 152 mg/dL — ABNORMAL HIGH (ref 70–99)
Potassium: 3.8 mmol/L (ref 3.5–5.1)
Sodium: 139 mmol/L (ref 135–145)

## 2020-08-29 LAB — GLUCOSE, CAPILLARY
Glucose-Capillary: 86 mg/dL (ref 70–99)
Glucose-Capillary: 136 mg/dL — ABNORMAL HIGH (ref 70–99)
Glucose-Capillary: 136 mg/dL — ABNORMAL HIGH (ref 70–99)
Glucose-Capillary: 204 mg/dL — ABNORMAL HIGH (ref 70–99)

## 2020-08-29 MED ORDER — CALCIUM GLUCONATE-NACL 1-0.675 GM/50ML-% IV SOLN
1.0000 g | Freq: Once | INTRAVENOUS | Status: AC
  Administered 2020-08-29: 1000 mg via INTRAVENOUS
  Filled 2020-08-29: qty 50

## 2020-08-29 MED ORDER — PROSOURCE PLUS PO LIQD
30.0000 mL | Freq: Two times a day (BID) | ORAL | Status: DC
  Administered 2020-08-30 – 2020-09-03 (×6): 30 mL via ORAL
  Filled 2020-08-29 (×7): qty 30

## 2020-08-29 MED ORDER — SODIUM CHLORIDE 0.9 % IV SOLN: INTRAVENOUS | Status: DC

## 2020-08-29 MED ORDER — POTASSIUM CHLORIDE 10 MEQ/100ML IV SOLN
10.0000 meq | INTRAVENOUS | Status: AC
  Administered 2020-08-29 (×2): 10 meq via INTRAVENOUS
  Filled 2020-08-29: qty 100

## 2020-08-29 NOTE — Progress Notes (Signed)
Initial Nutrition Assessment  RD working remotely.  DOCUMENTATION CODES:   Not applicable  INTERVENTION:   - Continue Boost Breeze po TID, each supplement provides 250 kcal and 9 grams of protein  - Add ProSource Plus po BID, each supplement provides 100 kcal and 15 grams of protein  NUTRITION DIAGNOSIS:   Increased nutrient needs related to cancer and cancer related treatments, acute illness as evidenced by estimated needs.  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 Pt underwent an MRCP which showed obstruction of the common duct due to central hepatic metastases at the hilum with intrahepatic biliary dilitation and cystic duct distension as well as gallstones. HIDA scan was compatible with chronic cholecystitis. Pt admitted with biliary obstruction secondary to hepatic mets in the setting of known metastatic colon cancer.  Diet advanced from clear liquids to soft yesterday. Meal completion at breakfast today charted as 100%.  Spoke with pt via phone call to room. She reports that she ate fairly well at breakfast today. She states that she had some eggs and a few pieces of toast. Pt reports confusion over why she wasn't allowed to have a muffin on Soft diet. Explained purpose of GI soft diet and provided pt with several suitable options for lunch. RD will change pt to "with assist" so that pt can have help with meal ordering. She reports that she does not have a menu in her room.  Pt reports decreased appetite and PO intake "since I got sick." She states that this occurred the Thursday before last (08/19/20). She states that during this time,  she has been eating simple and softer foods like jello.  Pt endorses weight loss during this time. She reports her UBW is 147 lbs. She states that her doctor told her that she lost 14 lbs. Reviewed weight history in chart. Weight on admission was 151.68 lbs (68.8 kg). Pt with a documented weight loss of 2.9 kg since 08/11/20. This is a 4% weight loss in 3 weeks which is not quite significant for timeframe.  Pt is at risk for malnutrition given weight loss and poor PO intake PTA. Pt states that she has some Boost Breeze at bedside that she has not yet tried. Encouraged pt to try Boost Breeze. RD will also order ProSource Plus to aid pt in meeting protein needs.  Medications reviewed and include: Boost Breeze TID, ferrous sulfate, SSI, lantus 12 units daily  Labs reviewed: potassium 3.2 CBG's: 86-169 x 24 hours  UOP: 1000 ml x 24 hours  NUTRITION - FOCUSED PHYSICAL EXAM:  Unable to complete at this time. RD working remotely.  Diet Order:   Diet Order            DIET SOFT Room service appropriate? Yes; Fluid consistency: Thin  Diet effective now                 EDUCATION NEEDS:   Education needs have been addressed  Skin:  Skin Assessment: Reviewed RN Assessment  Last BM:  08/28/20 minimal output in colostomy bag  Height:   Ht Readings from Last 1 Encounters:  08/27/20 5\' 2"  (1.575 m)  Weight:   Wt Readings from Last 1 Encounters:  08/27/20 68.8 kg    BMI:  Body mass index is 27.74 kg/m.  Estimated Nutritional Needs:   Kcal:  1600-1800  Protein:  80-95 grams  Fluid:  1.6-1.8 L    Gaynell Face, MS, RD, LDN Inpatient Clinical Dietitian Please see AMiON for contact information.

## 2020-08-29 NOTE — Progress Notes (Addendum)
PROGRESS NOTE  Cassandra Allen TIW:[id number] DOB: [birth year] DOA: 08/27/2020 PCP: [name], MD  HPI/Recap of past 25 hours: 74 year old female with metastatic colon cancer with obstruction status post left sigmoid colectomy who was transferred from another hospital due to requiring higher comprehensive intervention surgery  August 28, 2020: Patient has been seen and examined today she is doing much better she has been n.p.o. when she feels hungry and would like to eat.  She was n.p.o. for possible stents but GI has advised her to start soft fluid liquid and they have ordered for possible ERCP  August 29, 2020 update Patient seen and examined at bedside.  She was complaining of nausea this morning.  She has Zofran ordered.  Also her calcium and potassium were noted to be low we will start IV fluid and replacement  Assessment/Plan: Principal Problem:   Biliary obstruction Active Problems:   Essential (primary) hypertension   HLD (hyperlipidemia)   Atrial fibrillation, chronic (HCC)   Cancer of sigmoid colon   #1 metastatic colon cancer. Waiting on oncology opinion to direct therapy.  GI is following likely to have ERCP GI recommended the following:Metastatic colon cancer with currently resolved abdominal pain questionable due to gallbladder but MRI does not sound like liver met is larger Plan: We will allow soft solids and see how she does and consider surgical consult for possible cholecystectomy and would wait on oncology opinion at the current time to see if chemotherapy or radiation is needed and happy to proceed with ERCP   Oncology called for consult Surgery called for consult  2.  Hypertension improved continue current medications  3.  History of permanent atrial fibrillation rate is controlled.  Patient is currently not on medication for her A. fib  4.  Type 2 diabetes mellitus stable continue home medicine  5.  Hypokalemia potassium is 3.2 today I will  replace and recheck in the morning Potassium is still low today at 2.8 he went further down.  I have started her on potassium rider 10 mill equivalents x2 we will recheck in the morning  6.  Hypocalcemia.  Her calcium is 6. 0 today I will order calcium gluconate IV.  Will recheck a.m.  7.  Nausea with poor oral intake I will start her on IV Zofran as p.o. was not effective  Code Status: Full  Severity of Illness: The appropriate patient status for this patient is inpatient.  Inpatient status is judged to be reasonable and necessary in order to provide the required intensity of service to ensure the patient's safety. The patient's presenting symptoms, physical exam findings, and initial radiographic and laboratory data in the context of their medical condition is felt to place them at decreased risk for further clinical deterioration. Furthermore, it is anticipated that the patient will be medically stable for discharge from the hospital within 2 midnights of admission. The following factors support the patient status of inpatient:  " The patient's presenting symptoms include abdominal pain. " The physical exam findings include abdominal tender " The initial radiographic and laboratory data are  MRCP which showed obstruction of the common duct due to central hepatic metastases at the hilum with intrahepatic biliary dilitation and cystic duct distension as well as no gallbladder wall thickening but did show gallstones.   Oral intake is still poor and not able to meet her hydration needs and thus I have to restart IVF also continues to have nausea and abdominal pain while eating , P.o. Zofran not  helping her nausea so I have had to switch to using IV Zofran     Family Communication: Patient  Disposition Plan: Per oncology and GI  Consultants:  GI  Oncology  General surgery  Procedures:  MRCP  Antimicrobials:  None  DVT prophylaxis: Heparin subcu at   Objective: Vitals:    08/28/20 1412 08/28/20 2019 08/29/20 0531 08/29/20 1355  BP: (!) 127/52 111/68 135/69 (!) 136/51  Pulse: 68 65 (!) 56 (!) 53  Resp: _0 Temp: 98.1 F (36.7 C) 97.6 F (36.4 C) 97.7 F (36.5 C) 98.7 F (37.1 C)  TempSrc: Oral Oral Oral Oral  SpO2: 100% 94% 98% 97%  Weight:      Height:        Intake/Output Summary (Last 24 hours) at 08/29/2020 1651 Last data filed at 08/29/2020 1500 Gross per 24 hour  Intake 631.08 ml  Output 700 ml  Net -68.92 ml   Filed Weights   08/27/20 1600  Weight: 68.8 kg   Body mass index is 27.74 kg/m.  Exam:  . General: 74 y.o. year-old female well developed well nourished in no acute distress.  Alert and oriented x3. . Cardiovascular: Regular rate and rhythm with no rubs or gallops.  No thyromegaly or JVD noted.   Marland Kitchen Respiratory: Clear to auscultation with no wheezes or rales. Good inspiratory effort. . Abdomen: Soft nontender nondistended with normal bowel sounds x4 quadrants. . Musculoskeletal: No lower extremity edema. 2/4 pulses in all 4 extremities. . Skin: No ulcerative lesions noted or rashes, . Psychiatry: Mood is appropriate for condition and setting    Data Reviewed: CBC: Recent Labs  Lab 08/27/20 1847 08/28/20 1156 08/29/20 0500  WBC 6.2 5.5 5.7  NEUTROABS  --   --  4.5  HGB 9.5* 9.9* 10.0*  HCT 31.1* 32.2* 32.1*  MCV 95.4 95.8 95.3  PLT 166 178 093   Basic Metabolic Panel: Recent Labs  Lab 08/27/20 1847 08/28/20 1156 08/29/20 1011  NA 141 142 138  K 3.6 3.2* 2.8*  CL 108 106 112*  CO2 24 25 17*  GLUCOSE 101* 81 109*  BUN 25* 18 13  CREATININE 0.83 0.73 0.57  CALCIUM 8.1* 7.8* 6.0*   GFR: Estimated Creatinine Clearance: 57 mL/min (by C-G formula based on SCr of 0.57 mg/dL). Liver Function Tests: Recent Labs  Lab 08/27/20 1847 08/28/20 1156 08/29/20 1011  AST _1 ALT 37 31 23  ALKPHOS 615* 594* 535*  BILITOT 0.9 0.9 0.8  PROT 5.4* 5.2* 4.1*  ALBUMIN 2.6* 2.4* 1.9*   No results for  input(s): LIPASE, AMYLASE in the last 168 hours. No results for input(s): AMMONIA in the last 168 hours. Coagulation Profile: No results for input(s): INR, PROTIME in the last 168 hours. Cardiac Enzymes: No results for input(s): CKTOTAL, CKMB, CKMBINDEX, TROPONINI in the last 168 hours. BNP (last 3 results) No results for input(s): PROBNP in the last 8760 hours. HbA1C: No results for input(s): HGBA1C in the last 72 hours. CBG: Recent Labs  Lab 08/28/20 1141 08/28/20 1708 08/28/20 2021 08/29/20 0731 08/29/20 1149  GLUCAP 81 169* 160* 86 136*   Lipid Profile: No results for input(s): CHOL, HDL, LDLCALC, TRIG, CHOLHDL, LDLDIRECT in the last 72 hours. Thyroid Function Tests: No results for input(s): TSH, T4TOTAL, FREET4, T3FREE, THYROIDAB in the last 72 hours. Anemia Panel: No results for input(s): VITAMINB12, FOLATE, FERRITIN, TIBC, IRON, RETICCTPCT in the last 72 hours. Urine analysis:    Component Value  Date/Time   COLORURINE AMBER (A) 03/10/2019 0041   APPEARANCEUR CLEAR 03/10/2019 0041   LABSPEC 1.032 (H) 03/10/2019 0041   PHURINE 5.0 03/10/2019 0041   GLUCOSEU NEGATIVE 03/10/2019 0041   HGBUR SMALL (A) 03/10/2019 0041   BILIRUBINUR NEGATIVE 03/10/2019 0041   KETONESUR NEGATIVE 03/10/2019 0041   PROTEINUR 30 (A) 03/10/2019 0041   UROBILINOGEN 0.2 03/25/2015 2317   NITRITE POSITIVE (A) 03/10/2019 0041   LEUKOCYTESUR MODERATE (A) 03/10/2019 0041   Sepsis Labs: _0 (procalcitonin:4,lacticidven:4)  )No results found for this or any previous visit (from the past 240 hour(s)).    Studies: No results found.  Scheduled Meds: . (feeding supplement) PROSource Plus  30 mL Oral BID BM  . amLODipine  5 mg Oral Daily  . Chlorhexidine Gluconate Cloth  6 each Topical Daily  . feeding supplement  1 Container Oral TID BM  . ferrous sulfate  325 mg Oral Q breakfast  . heparin  5,000 Units Subcutaneous Q8H  . insulin aspart  0-9 Units Subcutaneous TID WC  . insulin  glargine  12 Units Subcutaneous QHS  . metoprolol tartrate  12.5 mg Oral BID  . oxybutynin  10 mg Oral Daily  . QUEtiapine  12.5 mg Oral QHS  . sertraline  100 mg Oral Daily  . simvastatin  10 mg Oral q1800    Continuous Infusions: . sodium chloride 125 mL/hr at 08/29/20 1500  . calcium gluconate    . potassium chloride       LOS: 0 days     Cristal Deer, MD [facility]  To reach me or the doctor on call, go to: [email or web address] Password [facility]  08/29/2020, 4:51 PM

## 2020-08-29 NOTE — Consult Note (Signed)
CC: metastatic colon cancer colon cancer with possible cholecystitis  Requesting provider: Dr. Kyung Bacca  HPI: This is a 74 year old female known to our service with a history of metastatic colon cancer.  She was receiving chemotherapy for 2 known cancers in her descending colon and sigmoid colon when she became obstructed early in 2020.  She underwent a colon resection and colostomy by Dr. Hassell Done in February 2020.  She is followed closely at the cancer center by Dr. Burr Medico.  She has had no liver metastatic disease.  She also has known gallstones.  She has also had radiation therapy.  She had an MRI in September of this year showing a 5 x 3.6 cm lesion centered around the caudate lobe of the liver.  There were no other new lesions.  The gallbladder at that time was moderately distended.  She has been doing well until this past Thursday when she started having worsening abdominal pain across her upper abdomen.  She presented to an outlying hospital.  She underwent a CT scan, MRCP, and CAT scan.  The MRCP suggested obstruction to the common duct due to metastases with some mild intrahepatic biliary dilation.  The gallbladder was also dilated.  She underwent a HIDA scan showing nonvisualization of the gallbladder.  She was transferred to [facility] for further care.  Currently, her abdominal pain is improved.  She still has some mild pain.  She denies jaundice.  She has had no fevers.  She is currently tolerating p.o.  There are also some splenic lesions and a left adrenal mass concerning for metastatic disease and a small volume of ascites around the gallbladder.  Past Medical History:  Diagnosis Date  . Acute diastolic heart failure ([place]) 12/22/2018  . Arthritis   . Atrial fibrillation, chronic ([place]) 12/22/2018  . Cancer of left colon ([place]) 10/30/2018  . Cancer of sigmoid colon  12/27/2018  . Diabetes mellitus without complication ([place])   . Hypertension   . Obesity (BMI 30-39.9) 12/27/2018    Past Surgical  History:  Procedure Laterality Date  . COLONOSCOPY  10/2018   Dr [name].  Large cancer at splenic flexure,  Bulky sigmoid colon mass, Numerous polyps  . Intra-abdominal abscess drainage  01/31/2019   Abscess drainage grew Enterococcus faecalis  . IR CATHETER TUBE CHANGE  06/05/2019  . IR IMAGING GUIDED PORT INSERTION  11/20/2018  . IR RADIOLOGIST EVAL & MGMT  06/04/2019  . IR RADIOLOGIST EVAL & MGMT  06/17/2019  . IR RADIOLOGIST EVAL & MGMT  09/16/2019  . LAPAROTOMY N/A 12/27/2018   Procedure: LEFT SIGMOID COLECTOMY WITH HARTMANN POUCH AND END COLOSTOMY;  Surgeon: [name], MD;  Location: [facility];  Service: General;  Laterality: N/A;    Family History  Problem Relation Age of Onset  . Heart attack Mother   . Heart attack Father     Social:  reports that she has never smoked. She has never used smokeless tobacco. She reports that she does not drink alcohol and does not use drugs.  Allergies:  Allergies  Allergen Reactions  . Metformin And Related Nausea And Vomiting  . Prednisone Nausea And Vomiting    Medications: I have reviewed the patient's current medications.  Results for orders placed or performed during the hospital encounter of 08/27/20 (from the past 48 hour(s))  CBC     Status: Abnormal   Collection Time: 08/27/20  6:47 PM  Result Value Ref Range   WBC 6.2 4.0 - 10.5 K/uL   RBC 3.26 (  L) 3.87 - 5.11 MIL/uL   Hemoglobin 9.5 (L) 12.0 - 15.0 g/dL   HCT 31.1 (L) 36 - 46 %   MCV 95.4 80.0 - 100.0 fL   MCH 29.1 26.0 - 34.0 pg   MCHC 30.5 30.0 - 36.0 g/dL   RDW 14.6 11.5 - 15.5 %   Platelets 166 150 - 400 K/uL   nRBC 0.0 0.0 - 0.2 %    Comment: Performed at [facility], [place] [address] [place], [place] [address]  Comprehensive metabolic panel     Status: Abnormal   Collection Time: 08/27/20  6:47 PM  Result Value Ref Range   Sodium 141 135 - 145 mmol/L   Potassium 3.6 3.5 - 5.1 mmol/L   Chloride 108 98 - 111 mmol/L   CO2 24 22 - 32 mmol/L    Glucose, Bld 101 (H) 70 - 99 mg/dL    Comment: Glucose reference range applies only to samples taken after fasting for at least 8 hours.   BUN 25 (H) 8 - 23 mg/dL   Creatinine, Ser 0.83 0.44 - 1.00 mg/dL   Calcium 8.1 (L) 8.9 - 10.3 mg/dL   Total Protein 5.4 (L) 6.5 - 8.1 g/dL   Albumin 2.6 (L) 3.5 - 5.0 g/dL   AST 25 15 - 41 U/L   ALT 37 0 - 44 U/L   Alkaline Phosphatase 615 (H) 38 - 126 U/L   Total Bilirubin 0.9 0.3 - 1.2 mg/dL   GFR, Estimated >60 >60 mL/min   Anion gap 9 5 - 15    Comment: Performed at [facility], [place] [address] [place], [place] [address]  Glucose, capillary     Status: Abnormal   Collection Time: 08/27/20 10:26 PM  Result Value Ref Range   Glucose-Capillary 165 (H) 70 - 99 mg/dL    Comment: Glucose reference range applies only to samples taken after fasting for at least 8 hours.  Glucose, capillary     Status: None   Collection Time: 08/28/20  7:57 AM  Result Value Ref Range   Glucose-Capillary 78 70 - 99 mg/dL    Comment: Glucose reference range applies only to samples taken after fasting for at least 8 hours.  Glucose, capillary     Status: None   Collection Time: 08/28/20 11:41 AM  Result Value Ref Range   Glucose-Capillary 81 70 - 99 mg/dL    Comment: Glucose reference range applies only to samples taken after fasting for at least 8 hours.  Comprehensive metabolic panel     Status: Abnormal   Collection Time: 08/28/20 11:56 AM  Result Value Ref Range   Sodium 142 135 - 145 mmol/L   Potassium 3.2 (L) 3.5 - 5.1 mmol/L   Chloride 106 98 - 111 mmol/L   CO2 25 22 - 32 mmol/L   Glucose, Bld 81 70 - 99 mg/dL    Comment: Glucose reference range applies only to samples taken after fasting for at least 8 hours.   BUN 18 8 - 23 mg/dL   Creatinine, Ser 0.73 0.44 - 1.00 mg/dL   Calcium 7.8 (L) 8.9 - 10.3 mg/dL   Total Protein 5.2 (L) 6.5 - 8.1 g/dL   Albumin 2.4 (L) 3.5 - 5.0 g/dL   AST 23 15 - 41 U/L   ALT 31 0 - 44 U/L   Alkaline  Phosphatase 594 (H) 38 - 126 U/L   Total Bilirubin 0.9 0.3 - 1.2 mg/dL   GFR, Estimated >  60 >60 mL/min   Anion gap 11 5 - 15    Comment: Performed at [facility], [place] [address], [place], [place] [address]  CBC     Status: Abnormal   Collection Time: 08/28/20 11:56 AM  Result Value Ref Range   WBC 5.5 4.0 - 10.5 K/uL   RBC 3.36 (L) 3.87 - 5.11 MIL/uL   Hemoglobin 9.9 (L) 12.0 - 15.0 g/dL   HCT 32.2 (L) 36 - 46 %   MCV 95.8 80.0 - 100.0 fL   MCH 29.5 26.0 - 34.0 pg   MCHC 30.7 30.0 - 36.0 g/dL   RDW 14.3 11.5 - 15.5 %   Platelets 178 150 - 400 K/uL   nRBC 0.0 0.0 - 0.2 %    Comment: Performed at [facility], [place] [address], [place], [place] [address]  Glucose, capillary     Status: Abnormal   Collection Time: 08/28/20  5:08 PM  Result Value Ref Range   Glucose-Capillary 169 (H) 70 - 99 mg/dL    Comment: Glucose reference range applies only to samples taken after fasting for at least 8 hours.  Glucose, capillary     Status: Abnormal   Collection Time: 08/28/20  8:21 PM  Result Value Ref Range   Glucose-Capillary 160 (H) 70 - 99 mg/dL    Comment: Glucose reference range applies only to samples taken after fasting for at least 8 hours.  CBC with Differential/Platelet     Status: Abnormal   Collection Time: 08/29/20  5:00 AM  Result Value Ref Range   WBC 5.7 4.0 - 10.5 K/uL   RBC 3.37 (L) 3.87 - 5.11 MIL/uL   Hemoglobin 10.0 (L) 12.0 - 15.0 g/dL   HCT 32.1 (L) 36 - 46 %   MCV 95.3 80.0 - 100.0 fL   MCH 29.7 26.0 - 34.0 pg   MCHC 31.2 30.0 - 36.0 g/dL   RDW 14.4 11.5 - 15.5 %   Platelets 173 150 - 400 K/uL   nRBC 0.0 0.0 - 0.2 %   Neutrophils Relative % 79 %   Neutro Abs 4.5 1.7 - 7.7 K/uL   Lymphocytes Relative 6 %   Lymphs Abs 0.3 (L) 0.7 - 4.0 K/uL   Monocytes Relative 11 %   Monocytes Absolute 0.6 0.1 - 1.0 K/uL   Eosinophils Relative 1 %   Eosinophils Absolute 0.1 0.0 - 0.5 K/uL   Basophils Relative 1 %   Basophils Absolute 0.0  0.0 - 0.1 K/uL   Immature Granulocytes 2 %   Abs Immature Granulocytes 0.09 (H) 0.00 - 0.07 K/uL    Comment: Performed at [facility], [place] [address], [place], [place] [address]  Glucose, capillary     Status: None   Collection Time: 08/29/20  7:31 AM  Result Value Ref Range   Glucose-Capillary 86 70 - 99 mg/dL    Comment: Glucose reference range applies only to samples taken after fasting for at least 8 hours.  Comprehensive metabolic panel     Status: Abnormal   Collection Time: 08/29/20 10:11 AM  Result Value Ref Range   Sodium 138 135 - 145 mmol/L   Potassium 2.8 (L) 3.5 - 5.1 mmol/L   Chloride 112 (H) 98 - 111 mmol/L   CO2 17 (L) 22 - 32 mmol/L   Glucose, Bld 109 (H) 70 - 99 mg/dL    Comment: Glucose reference range applies only to samples taken after fasting for at least 8  hours.   BUN 13 8 - 23 mg/dL   Creatinine, Ser 0.57 0.44 - 1.00 mg/dL   Calcium 6.0 (LL) 8.9 - 10.3 mg/dL    Comment: CRITICAL RESULT CALLED TO, READ BACK BY AND VERIFIED WITH: RN J. CROWLEY @ 1306 10/17 LL    Total Protein 4.1 (L) 6.5 - 8.1 g/dL   Albumin 1.9 (L) 3.5 - 5.0 g/dL   AST 20 15 - 41 U/L   ALT 23 0 - 44 U/L   Alkaline Phosphatase 535 (H) 38 - 126 U/L   Total Bilirubin 0.8 0.3 - 1.2 mg/dL   GFR, Estimated >60 >60 mL/min   Anion gap 9 5 - 15    Comment: Performed at [facility], [place] [address], [place], [place] [address]  Glucose, capillary     Status: Abnormal   Collection Time: 08/29/20 11:49 AM  Result Value Ref Range   Glucose-Capillary 136 (H) 70 - 99 mg/dL    Comment: Glucose reference range applies only to samples taken after fasting for at least 8 hours.  Glucose, capillary     Status: Abnormal   Collection Time: 08/29/20  5:04 PM  Result Value Ref Range   Glucose-Capillary 136 (H) 70 - 99 mg/dL    Comment: Glucose reference range applies only to samples taken after fasting for at least 8 hours.    No results found.  ROS - all of the  below systems have been reviewed with the patient and positives are indicated with bold text General: chills, fever or night sweats Eyes: blurry vision or double vision ENT: epistaxis or sore throat Allergy/Immunology: itchy/watery eyes or nasal congestion Hematologic/Lymphatic: bleeding problems, blood clots or swollen lymph nodes Endocrine: temperature intolerance or unexpected weight changes Breast: new or changing breast lumps or nipple discharge Resp: cough, shortness of breath, or wheezing CV: chest pain or dyspnea on exertion GI: as per HPI GU: dysuria, trouble voiding, or hematuria MSK: joint pain or joint stiffness Neuro: TIA or stroke symptoms Derm: pruritus and skin lesion changes Psych: anxiety and depression  PE Blood pressure (!) 136/51, pulse (!) 53, temperature 98.7 F (37.1 C), temperature source Oral, resp. rate 15, height 5\' 2"  (1.575 m), weight 68.8 kg, SpO2 97 %. Constitutional: NAD; conversant; no deformities Eyes: Moist conjunctiva; no lid lag; anicteric; PERRL Neck: Trachea midline; no thyromegaly Lungs: Normal respiratory effort; no tactile fremitus CV: RRR; no palpable thrills; no pitting edema GI: Abd soft with mild tenderness in the right upper quadrant.  There is a well-healed functioning ostomy in the left lower quadrant; no palpable hepatosplenomegaly MSK: Normal range of motion of extremities; no clubbing/cyanosis Psychiatric: Appropriate affect; alert and oriented x3 Lymphatic: No palpable cervical or axillary lymphadenopathy  Results for orders placed or performed during the hospital encounter of 08/27/20 (from the past 48 hour(s))  CBC     Status: Abnormal   Collection Time: 08/27/20  6:47 PM  Result Value Ref Range   WBC 6.2 4.0 - 10.5 K/uL   RBC 3.26 (L) 3.87 - 5.11 MIL/uL   Hemoglobin 9.5 (L) 12.0 - 15.0 g/dL   HCT 31.1 (L) 36 - 46 %   MCV 95.4 80.0 - 100.0 fL   MCH 29.1 26.0 - 34.0 pg   MCHC 30.5 30.0 - 36.0 g/dL   RDW 14.6 11.5 - 15.5 %    Platelets 166 150 - 400 K/uL   nRBC 0.0 0.0 - 0.2 %    Comment: Performed at [facility], [place] [address] [place], [place] [address]  Comprehensive metabolic panel     Status: Abnormal   Collection Time: 08/27/20  6:47 PM  Result Value Ref Range   Sodium 141 135 - 145 mmol/L   Potassium 3.6 3.5 - 5.1 mmol/L   Chloride 108 98 - 111 mmol/L   CO2 24 22 - 32 mmol/L   Glucose, Bld 101 (H) 70 - 99 mg/dL    Comment: Glucose reference range applies only to samples taken after fasting for at least 8 hours.   BUN 25 (H) 8 - 23 mg/dL   Creatinine, Ser 0.83 0.44 - 1.00 mg/dL   Calcium 8.1 (L) 8.9 - 10.3 mg/dL   Total Protein 5.4 (L) 6.5 - 8.1 g/dL   Albumin 2.6 (L) 3.5 - 5.0 g/dL   AST 25 15 - 41 U/L   ALT 37 0 - 44 U/L   Alkaline Phosphatase 615 (H) 38 - 126 U/L   Total Bilirubin 0.9 0.3 - 1.2 mg/dL   GFR, Estimated >60 >60 mL/min   Anion gap 9 5 - 15    Comment: Performed at [facility], [place] [address], [place], [place] [address]  Glucose, capillary     Status: Abnormal   Collection Time: 08/27/20 10:26 PM  Result Value Ref Range   Glucose-Capillary 165 (H) 70 - 99 mg/dL    Comment: Glucose reference range applies only to samples taken after fasting for at least 8 hours.  Glucose, capillary     Status: None   Collection Time: 08/28/20  7:57 AM  Result Value Ref Range   Glucose-Capillary 78 70 - 99 mg/dL    Comment: Glucose reference range applies only to samples taken after fasting for at least 8 hours.  Glucose, capillary     Status: None   Collection Time: 08/28/20 11:41 AM  Result Value Ref Range   Glucose-Capillary 81 70 - 99 mg/dL    Comment: Glucose reference range applies only to samples taken after fasting for at least 8 hours.  Comprehensive metabolic panel     Status: Abnormal   Collection Time: 08/28/20 11:56 AM  Result Value Ref Range   Sodium 142 135 - 145 mmol/L   Potassium 3.2 (L) 3.5 - 5.1 mmol/L   Chloride 106 98 - 111  mmol/L   CO2 25 22 - 32 mmol/L   Glucose, Bld 81 70 - 99 mg/dL    Comment: Glucose reference range applies only to samples taken after fasting for at least 8 hours.   BUN 18 8 - 23 mg/dL   Creatinine, Ser 0.73 0.44 - 1.00 mg/dL   Calcium 7.8 (L) 8.9 - 10.3 mg/dL   Total Protein 5.2 (L) 6.5 - 8.1 g/dL   Albumin 2.4 (L) 3.5 - 5.0 g/dL   AST 23 15 - 41 U/L   ALT 31 0 - 44 U/L   Alkaline Phosphatase 594 (H) 38 - 126 U/L   Total Bilirubin 0.9 0.3 - 1.2 mg/dL   GFR, Estimated >60 >60 mL/min   Anion gap 11 5 - 15    Comment: Performed at [facility], [place] [address], [place], [place] [address]  CBC     Status: Abnormal   Collection Time: 08/28/20 11:56 AM  Result Value Ref Range   WBC 5.5 4.0 - 10.5 K/uL   RBC 3.36 (L) 3.87 - 5.11 MIL/uL   Hemoglobin 9.9 (L) 12.0 - 15.0 g/dL   HCT 32.2 (L) 36 -  46 %   MCV 95.8 80.0 - 100.0 fL   MCH 29.5 26.0 - 34.0 pg   MCHC 30.7 30.0 - 36.0 g/dL   RDW 14.3 11.5 - 15.5 %   Platelets 178 150 - 400 K/uL   nRBC 0.0 0.0 - 0.2 %    Comment: Performed at [facility], [place] [address] [place], [place] [address]  Glucose, capillary     Status: Abnormal   Collection Time: 08/28/20  5:08 PM  Result Value Ref Range   Glucose-Capillary 169 (H) 70 - 99 mg/dL    Comment: Glucose reference range applies only to samples taken after fasting for at least 8 hours.  Glucose, capillary     Status: Abnormal   Collection Time: 08/28/20  8:21 PM  Result Value Ref Range   Glucose-Capillary 160 (H) 70 - 99 mg/dL    Comment: Glucose reference range applies only to samples taken after fasting for at least 8 hours.  CBC with Differential/Platelet     Status: Abnormal   Collection Time: 08/29/20  5:00 AM  Result Value Ref Range   WBC 5.7 4.0 - 10.5 K/uL   RBC 3.37 (L) 3.87 - 5.11 MIL/uL   Hemoglobin 10.0 (L) 12.0 - 15.0 g/dL   HCT 32.1 (L) 36 - 46 %   MCV 95.3 80.0 - 100.0 fL   MCH 29.7 26.0 - 34.0 pg   MCHC 31.2 30.0 - 36.0 g/dL    RDW 14.4 11.5 - 15.5 %   Platelets 173 150 - 400 K/uL   nRBC 0.0 0.0 - 0.2 %   Neutrophils Relative % 79 %   Neutro Abs 4.5 1.7 - 7.7 K/uL   Lymphocytes Relative 6 %   Lymphs Abs 0.3 (L) 0.7 - 4.0 K/uL   Monocytes Relative 11 %   Monocytes Absolute 0.6 0.1 - 1.0 K/uL   Eosinophils Relative 1 %   Eosinophils Absolute 0.1 0.0 - 0.5 K/uL   Basophils Relative 1 %   Basophils Absolute 0.0 0.0 - 0.1 K/uL   Immature Granulocytes 2 %   Abs Immature Granulocytes 0.09 (H) 0.00 - 0.07 K/uL    Comment: Performed at [facility], [place] [address], [place], [place] [address]  Glucose, capillary     Status: None   Collection Time: 08/29/20  7:31 AM  Result Value Ref Range   Glucose-Capillary 86 70 - 99 mg/dL    Comment: Glucose reference range applies only to samples taken after fasting for at least 8 hours.  Comprehensive metabolic panel     Status: Abnormal   Collection Time: 08/29/20 10:11 AM  Result Value Ref Range   Sodium 138 135 - 145 mmol/L   Potassium 2.8 (L) 3.5 - 5.1 mmol/L   Chloride 112 (H) 98 - 111 mmol/L   CO2 17 (L) 22 - 32 mmol/L   Glucose, Bld 109 (H) 70 - 99 mg/dL    Comment: Glucose reference range applies only to samples taken after fasting for at least 8 hours.   BUN 13 8 - 23 mg/dL   Creatinine, Ser 0.57 0.44 - 1.00 mg/dL   Calcium 6.0 (LL) 8.9 - 10.3 mg/dL    Comment: CRITICAL RESULT CALLED TO, READ BACK BY AND VERIFIED WITH: RN J. CROWLEY @ 1306 10/17 LL    Total Protein 4.1 (L) 6.5 - 8.1 g/dL   Albumin 1.9 (L) 3.5 - 5.0 g/dL   AST 20 15 - 41 U/L   ALT 23 0 -  44 U/L   Alkaline Phosphatase 535 (H) 38 - 126 U/L   Total Bilirubin 0.8 0.3 - 1.2 mg/dL   GFR, Estimated >60 >60 mL/min   Anion gap 9 5 - 15    Comment: Performed at [facility], [place] [address], [place], [place] [address]  Glucose, capillary     Status: Abnormal   Collection Time: 08/29/20 11:49 AM  Result Value Ref Range   Glucose-Capillary 136 (H) 70 - 99 mg/dL     Comment: Glucose reference range applies only to samples taken after fasting for at least 8 hours.  Glucose, capillary     Status: Abnormal   Collection Time: 08/29/20  5:04 PM  Result Value Ref Range   Glucose-Capillary 136 (H) 70 - 99 mg/dL    Comment: Glucose reference range applies only to samples taken after fasting for at least 8 hours.    No results found.   A/P: Metastatic colon cancer with possible acute versus chronic cholecystitis.  I reviewed her notes from the oncologist from her previous visit.  I reviewed the results of the MR CP and HIDA scan done at the outlying institution.  Clinically, she is improving.  Except for her alkaline phosphatase, her other liver function tests were normal.  She does not appear jaundiced.  She does have nonvisualization of the gallbladder on HIDA scan but this may be due to her metastatic disease.  The question will be whether to treat the cholecystitis conservatively with antibiotics alone versus interventional radiology placing a drain versus an attempt at laparoscopic cholecystectomy.  Her labs will be repeated in the morning.  I will discuss this with our acute care surgeon tomorrow morning.  After she has been seen by oncology and we discussed this further with GI as well, we can determine the best course of action in this difficult situation.  Coralie Keens, M.D. [facility] Use [email or web address] to contact on call provider

## 2020-08-29 NOTE — Progress Notes (Signed)
Thedore Mins Dusenbery 10:11 AM  Subjective: Patient doing fine this morning tolerating diet did have some pain last night Ultram helped does use Tylenol at home but her son rarely gives her her pain medicine and has not been seen by surgery or oncology team as recommended yesterday  Objective: Vital signs stable afebrile no acute distress CBC okay abdomen is soft nontender no repeat chemistries  Assessment: Abnormal MRI abdominal pain abnormal HIDA scan  Plan: Recommend oncology consult and surgical consult and please see yesterday's note for details and I will be on standby to help with ERCP if needed and will await repeat labs as above  [facility]  office [phone] After 5PM or if no answer call [phone]

## 2020-08-30 DIAGNOSIS — D509 Iron deficiency anemia, unspecified: Secondary | ICD-10-CM | POA: Diagnosis present

## 2020-08-30 DIAGNOSIS — M1990 Unspecified osteoarthritis, unspecified site: Secondary | ICD-10-CM | POA: Diagnosis present

## 2020-08-30 DIAGNOSIS — C185 Malignant neoplasm of splenic flexure: Secondary | ICD-10-CM | POA: Diagnosis present

## 2020-08-30 DIAGNOSIS — C189 Malignant neoplasm of colon, unspecified: Secondary | ICD-10-CM | POA: Diagnosis present

## 2020-08-30 DIAGNOSIS — Z79899 Other long term (current) drug therapy: Secondary | ICD-10-CM | POA: Diagnosis not present

## 2020-08-30 DIAGNOSIS — C186 Malignant neoplasm of descending colon: Secondary | ICD-10-CM | POA: Diagnosis present

## 2020-08-30 DIAGNOSIS — K831 Obstruction of bile duct: Secondary | ICD-10-CM | POA: Diagnosis not present

## 2020-08-30 DIAGNOSIS — Z794 Long term (current) use of insulin: Secondary | ICD-10-CM | POA: Diagnosis not present

## 2020-08-30 DIAGNOSIS — Z8249 Family history of ischemic heart disease and other diseases of the circulatory system: Secondary | ICD-10-CM | POA: Diagnosis not present

## 2020-08-30 DIAGNOSIS — E119 Type 2 diabetes mellitus without complications: Secondary | ICD-10-CM | POA: Diagnosis present

## 2020-08-30 DIAGNOSIS — C187 Malignant neoplasm of sigmoid colon: Secondary | ICD-10-CM | POA: Diagnosis present

## 2020-08-30 DIAGNOSIS — Z888 Allergy status to other drugs, medicaments and biological substances status: Secondary | ICD-10-CM | POA: Diagnosis not present

## 2020-08-30 DIAGNOSIS — E785 Hyperlipidemia, unspecified: Secondary | ICD-10-CM | POA: Diagnosis present

## 2020-08-30 DIAGNOSIS — E876 Hypokalemia: Secondary | ICD-10-CM | POA: Diagnosis present

## 2020-08-30 DIAGNOSIS — Z933 Colostomy status: Secondary | ICD-10-CM | POA: Diagnosis not present

## 2020-08-30 DIAGNOSIS — I4821 Permanent atrial fibrillation: Secondary | ICD-10-CM | POA: Diagnosis present

## 2020-08-30 DIAGNOSIS — I5032 Chronic diastolic (congestive) heart failure: Secondary | ICD-10-CM | POA: Diagnosis present

## 2020-08-30 DIAGNOSIS — K8011 Calculus of gallbladder with chronic cholecystitis with obstruction: Secondary | ICD-10-CM | POA: Diagnosis present

## 2020-08-30 DIAGNOSIS — C787 Secondary malignant neoplasm of liver and intrahepatic bile duct: Secondary | ICD-10-CM | POA: Diagnosis present

## 2020-08-30 DIAGNOSIS — I110 Hypertensive heart disease with heart failure: Secondary | ICD-10-CM | POA: Diagnosis present

## 2020-08-30 LAB — GLUCOSE, CAPILLARY
Glucose-Capillary: 111 mg/dL — ABNORMAL HIGH (ref 70–99)
Glucose-Capillary: 178 mg/dL — ABNORMAL HIGH (ref 70–99)
Glucose-Capillary: 123 mg/dL — ABNORMAL HIGH (ref 70–99)
Glucose-Capillary: 105 mg/dL — ABNORMAL HIGH (ref 70–99)

## 2020-08-30 MED ORDER — CALCIUM CARBONATE ANTACID 500 MG PO CHEW
1.0000 | CHEWABLE_TABLET | Freq: Three times a day (TID) | ORAL | Status: DC | PRN
  Administered 2020-08-30 – 2020-09-02 (×5): 200 mg via ORAL
  Filled 2020-08-30 (×5): qty 1

## 2020-08-30 MED ORDER — URSODIOL 300 MG PO CAPS
300.0000 mg | ORAL_CAPSULE | Freq: Two times a day (BID) | ORAL | Status: DC
  Administered 2020-08-30 – 2020-09-03 (×9): 300 mg via ORAL
  Filled 2020-08-30 (×10): qty 1

## 2020-08-30 NOTE — Progress Notes (Signed)
[facility] Progress Note     Subjective: Patient reports that she occasionally has abdominal pain or nausea after eating. Denies pain or nausea when I was present in room. She has been tolerating FLD and having ostomy output. She reports that gradually over the last several months she has started to experience some early satiety. She is really nervous about the possibility of intubation for any procedures.   Objective: Vital signs in last 24 hours: Temp:  [98.2 F (36.8 C)-98.7 F (37.1 C)] 98.2 F (36.8 C) (10/18 0616) Pulse Rate:  [53-60] 60 (10/18 0616) Resp:  [15-16] 16 (10/18 0616) BP: (126-152)/(51-62) 152/62 (10/18 0616) SpO2:  [96 %-98 %] 98 % (10/18 0616) Last BM Date: 08/29/20 (small amount)  Intake/Output from previous day: 10/17 0701 - 10/18 0700 In: 2457.4 [P.O.:837; I.V.:1620.4] Out: 400 [Urine:400] Intake/Output this shift: No intake/output data recorded.  PE: General: pleasant, WD, chronically ill appearing female who is laying in bed in NAD HEENT:  Sclera are anicteric.  PERRL.  Ears and nose without any masses or lesions.  Mouth is pink and moist Heart: regular, rate, and rhythm.   Lungs: CTAB, no wheezes, rhonchi, or rales noted.  Respiratory effort nonlabored Abd: soft, NT, ND, +BS, colostomy in RLQ with soft brown stool present Psych: A&Ox3 with an appropriate affect.   Lab Results:  Recent Labs    08/28/20 1156 08/29/20 0500  WBC 5.5 5.7  HGB 9.9* 10.0*  HCT 32.2* 32.1*  PLT 178 173   BMET Recent Labs    08/29/20 1011 08/29/20 1933  NA 138 139  K 2.8* 3.8  CL 112* 105  CO2 17* 23  GLUCOSE 109* 152*  BUN 13 21  CREATININE 0.57 0.93  CALCIUM 6.0* 8.2*   PT/INR No results for input(s): LABPROT, INR in the last 72 hours. CMP     Component Value Date/Time   NA 139 08/29/2020 1933   NA 143 02/14/2019 0000   K 3.8 08/29/2020 1933   CL 105 08/29/2020 1933   CL 101 02/14/2019 0000   CO2 23 08/29/2020 1933   CO2 23  02/14/2019 0000   GLUCOSE 152 (H) 08/29/2020 1933   BUN 21 08/29/2020 1933   BUN 25 (A) 02/14/2019 0000   CREATININE 0.93 08/29/2020 1933   CREATININE 1.11 (H) 06/21/2020 1451   CALCIUM 8.2 (L) 08/29/2020 1933   CALCIUM 8.7 02/14/2019 0000   PROT 4.1 (L) 08/29/2020 1011   PROT 5.3 (A) 01/23/2019 0000   PROT 5.3 (A) 01/23/2019 0000   ALBUMIN 1.9 (L) 08/29/2020 1011   ALBUMIN 2.4 01/23/2019 0000   ALBUMIN 2.4 01/23/2019 0000   AST 20 08/29/2020 1011   AST 11 (L) 06/21/2020 1451   ALT 23 08/29/2020 1011   ALT 8 06/21/2020 1451   ALKPHOS 535 (H) 08/29/2020 1011   BILITOT 0.8 08/29/2020 1011   BILITOT 0.3 06/21/2020 1451   GFRNONAA >60 08/29/2020 1933   GFRNONAA 49 (L) 06/21/2020 1451   GFRAA 57 (L) 06/21/2020 1451   Lipase     Component Value Date/Time   LIPASE 38 03/10/2019 2058       Studies/Results: No results found.  Anti-infectives: Anti-infectives (From admission, onward)   None       Assessment/Plan Metastatic colon cancer s/p colostomy  HTN A. Fib - not currently on meds for this T2DM  ?CBD obstruction secondary to metastatic disease - GI following, currently Tbili is normal and Alk Phos was downtrending Acute vs Chronic cholecystitis  -  I would not recommend lap chole at this time as WBC has normalized and patient seems to be clinically improving - Patient not currently on abx - could consider coverage but since she seems to have improved without I'm not sure this is necessary  - could consider ursadiol to help dissolve stones in this patient  - we will continue to follow  FEN: soft diet VTE: SQ heparin ID: no current abx   LOS: 0 days    Norm Parcel , [facility] 08/30/2020, 8:50 AM Please see Amion for pager number during day hours 7:00am-4:30pm

## 2020-08-30 NOTE — Progress Notes (Signed)
PROGRESS NOTE    Cassandra Allen  OQH:[id number] DOB: 1946/11/11 DOA: 08/27/2020 PCP: [name], MD   Chief Complain:  Brief Narrative: Patient is a 68 female with history of metastatic colon cancer with obstruction status post left sigmoid colectomy and colostomy on 5/46/5681, diastolic congestive heart failure, hypertension, hyperlipidemia, diabetes mellitus type II, permanent A. fib  who presented as a transfer from [facility] for evaluation of abdominal pain/surgical management.  She was found to have elevated alkaline phosphatase and underwent MRCP which showed obstruction of the common bile duct due to central hepatic metastasis with intrahepatic biliary dilation and cystic duct distention.  HIDA scan was abnormal and showed chronic cholecystitis.  GI, oncology, general surgery following.  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  Follows with Dr. Burr Medico. MRCP done in [facility] showed obstruction of the common bile duct due to central hepatic metastasis with intrahepatic dilatation and cystic duct distention.  Imagings also showed a splenic lesions, left adrenal mass concerning for metastatic disease.  HIDA scan was abnormal and showed chronic cholecystitis.  There was no  bladder wall thickening as per imagings.  We will monitor her off antibiotics.  She is afebrile and she did not have leukocytosis. General surgery, oncology and GI closely following. Since bilirubin remains stable, ERCP with stent placement or percutaneous biliary drain placement is not indicated as per GI.  Continue to monitor  liver function.  General surgery not planning for cholecystectomy either.  Started on ursodiol  Abdominal pain/possible chronic cholecystitis: Presented with this complaint.  Currently abdominal pain-free and she is tolerating oral diet.  Complains of some nausea but no vomiting.  Colostomy is functioning.  Abdomen is soft and nontender.  Antibiotics not restarted.  Permanent A. fib: Currently rate is controlled.  Currently not on anticoagulation.  On rate control with Lopressor.  Hypertension: Currently Stable.  Currently on Lopressor and amlodipine  Hyperlipidemia: On statin  Diabetes type 2: Continue current insulin regimen.  Monitor blood sugars  Iron deficiency anemia: She had received iron infusion in the past.  Continue oral supplementation.  Severe hypokalemia: Supplemented and corrected.    Nutrition Problem: Increased nutrient needs Etiology: cancer and cancer related treatments, acute illness      DVT prophylaxis:Heparin [place] Code Status: Full Family Communication: None at the bedside Status is: Inpatient  Remains inpatient appropriate because:Inpatient level of care appropriate due to severity of illness   Dispo: The patient is from: Home              Anticipated d/c is to: Home              Anticipated d/c date is: 2 days              Patient currently is not medically stable to d/c.     Consultants: GI, general surgery, oncology  Procedures: None  Antimicrobials:  Anti-infectives (From admission, onward)   None      Subjective: Patient seen and examined at the bedside this afternoon.  Comfortable.  Denies any abdomen pain at present.  She has some nausea but no vomiting.  Tolerating soft diet.  Objective: Vitals:  08/29/20 0531 08/29/20 1355 08/29/20 2121 08/30/20 0616  BP: 135/69 (!) 136/51 (!) 126/54 (!) 152/62  Pulse: (!) 56 (!) 53 60 60  Resp: 14 15 16 16   Temp: 97.7 F (36.5 C) 98.7 F (37.1 C) 98.6 F (37 C) 98.2 F (36.8 C)  TempSrc:  Oral Oral Oral Oral  SpO2: 98% 97% 96% 98%  Weight:      Height:        Intake/Output Summary (Last 24 hours) at 08/30/2020 1252 Last data filed at 08/30/2020 1046 Gross per 24 hour  Intake 2227.35 ml  Output 900 ml  Net 1327.35 ml   Filed Weights   08/27/20 1600  Weight: 68.8 kg    Examination:  General exam: Appears calm and comfortable ,Not in distress,average built HEENT:PERRL,Oral mucosa moist, Ear/Nose normal on gross exam Respiratory system: Bilateral equal air entry, normal vesicular breath sounds, no wheezes or crackles  Cardiovascular system: Irregularly irregular rhythm, no JVD, murmurs, rubs, gallops or clicks. No pedal edema.  Chemo-Port on the right chest Gastrointestinal system: Abdomen is nondistended, soft and nontender. No organomegaly or masses felt. Normal bowel sounds heard.  Colostomy Central nervous system: Alert and oriented. No focal neurological deficits. Extremities: No edema, no clubbing ,no cyanosis, distal peripheral pulses palpable. Skin: No rashes, lesions or ulcers,no icterus ,no pallor   Data Reviewed: I have personally reviewed following labs and imaging studies  CBC: Recent Labs  Lab 08/27/20 1847 08/28/20 1156 08/29/20 0500  WBC 6.2 5.5 5.7  NEUTROABS  --   --  4.5  HGB 9.5* 9.9* 10.0*  HCT 31.1* 32.2* 32.1*  MCV 95.4 95.8 95.3  PLT 166 178 474   Basic Metabolic Panel: Recent Labs  Lab 08/27/20 1847 08/28/20 1156 08/29/20 1011 08/29/20 1933  NA 141 142 138 139  K 3.6 3.2* 2.8* 3.8  CL 108 106 112* 105  CO2 24 25 17* 23  GLUCOSE 101* 81 109* 152*  BUN 25* 18 13 21   CREATININE 0.83 0.73 0.57 0.93  CALCIUM 8.1* 7.8* 6.0* 8.2*   GFR: Estimated Creatinine Clearance: 49 mL/min (by C-G formula based on SCr of 0.93 mg/dL). Liver Function Tests: Recent Labs  Lab 08/27/20 1847 08/28/20 1156 08/29/20 1011  AST 25 23 20   ALT 37 31 23  ALKPHOS 615* 594* 535*  BILITOT 0.9 0.9 0.8  PROT 5.4* 5.2* 4.1*  ALBUMIN 2.6* 2.4*  1.9*   No results for input(s): LIPASE, AMYLASE in the last 168 hours. No results for input(s): AMMONIA in the last 168 hours. Coagulation Profile: No results for input(s): INR, PROTIME in the last 168 hours. Cardiac Enzymes: No results for input(s): CKTOTAL, CKMB, CKMBINDEX, TROPONINI in the last 168 hours. BNP (last 3 results) No results for input(s): PROBNP in the last 8760 hours. HbA1C: No results for input(s): HGBA1C in the last 72 hours. CBG: Recent Labs  Lab 08/29/20 1149 08/29/20 1704 08/29/20 2123 08/30/20 0751 08/30/20 1151  GLUCAP 136* 136* 204* 111* 178*   Lipid Profile: No results for input(s): CHOL, HDL, LDLCALC, TRIG, CHOLHDL, LDLDIRECT in the last 72 hours. Thyroid Function Tests: No results for input(s): TSH, T4TOTAL, FREET4, T3FREE, THYROIDAB in the last 72 hours. Anemia Panel: No results for input(s): VITAMINB12, FOLATE, FERRITIN, TIBC, IRON, RETICCTPCT in the last 72 hours. Sepsis Labs: No results for input(s): PROCALCITON, LATICACIDVEN in the last 168 hours.  No results found for this or any previous visit (from the past 240 hour(s)).       Radiology Studies: No  results found.      Scheduled Meds: . (feeding supplement) PROSource Plus  30 mL Oral BID BM  . amLODipine  5 mg Oral Daily  . Chlorhexidine Gluconate Cloth  6 each Topical Daily  . feeding supplement  1 Container Oral TID BM  . ferrous sulfate  325 mg Oral Q breakfast  . heparin  5,000 Units Subcutaneous Q8H  . insulin aspart  0-9 Units Subcutaneous TID WC  . insulin glargine  12 Units Subcutaneous QHS  . metoprolol tartrate  12.5 mg Oral BID  . oxybutynin  10 mg Oral Daily  . QUEtiapine  12.5 mg Oral QHS  . sertraline  100 mg Oral Daily  . simvastatin  10 mg Oral q1800   Continuous Infusions: . sodium chloride 125 mL/hr at 08/30/20 1112     LOS: 0 days    Time spent: 25 mins.More than 50% of that time was spent in counseling and/or coordination of care.      Shelly Coss, MD [facility] P10/18/2021, 12:52 PM seen

## 2020-08-30 NOTE — Progress Notes (Addendum)
HEMATOLOGY-ONCOLOGY PROGRESS NOTE  SUBJECTIVE: The patient is followed in our office for her metastatic colon cancer.  She is currently on observation.  She has previously received 2 cycles of FOLFOX which was stopped secondary to bowel obstruction.  She also received SBRT to her liver lesion in May 2021.  MRI of the abdomen performed on 08/05/2020 just prior to our last visit with her showed stable disease.  She presented to [facility] secondary to abdominal pain.  Outside imaging has been reviewed through care everywhere.  She initially had a CT of the abdomen pelvis with contrast which showed cholelithiasis and possible developing cholecystitis, new mild to moderate intrahepatic and extrahepatic biliary duct dilatation, interval decrease in size in the indeterminate lesion centered in the caudate liver.  Ultrasound the gallbladder showed gallstones and mild biliary ductal dilatation.  MRCP showed obstruction of the common duct due to central hepatic metastases at the hilum with mild intrahepatic biliary dilatation and gallbladder and cystic duct distention.  There were also splenic lesions and a left adrenal mass concerning for metastases.  HIDA scan performed which showed nonvisualization of gallbladder activity, exam could not exclude acute cholecystitis at the very least the patient likely has chronic cholecystitis.  When seen this morning, the patient reports ongoing abdominal pain.  She has had some nausea but no vomiting.  Colostomy functioning without any difficulty.  She is eating breakfast at the time of visit and tolerating her diet well.  Oncology History Overview Note  Cancer Staging Cancer of splenic flexure of colon Staging form: Colon and Rectum, AJCC 8th Edition - Clinical stage from 10/25/2018: Stage Unknown (cTX, cN0, cM1) - Signed by Truitt Merle, MD on 11/22/2018 - Pathologic stage from 12/27/2018: pT3, pN0, cM1 - Signed by Truitt Merle, MD on 05/02/2019    Cancer of  splenic flexure of colon  10/25/2018 Procedure   10/25/2018 Colonoscopy Impression: -Two 4 to 6 mm sessile polyps were found in the sigmoid colon. Resected and retrieved. -A 6 mm sessile polyp was found in the descending colon. Resected and retrieved. -Three 5 to 15 mm sessile polyps were found in the transverse colon. Resection and retrieval were complete. -A 12 mm sessile polyp was found in the cecum. Resection and retrieval were complete.  -A 20 mm sessile polyp was found in the ascending colon. Resection and retrieval were complete. . -A malignancy partially obstructing large mass was found at 55 cm proximal to the anus. Biopsied and tattooed. -Malignant partially obstructing tumor 20 cm proximal to the anus. Biopsied and tattooed. -Diverticulosis in the sigmoid colon, in the descending colon and in the transverse colon.    10/25/2018 Imaging   10/25/2018 Endoscopy Impression: -Normal esophagus -Z-line regular 35 cm from the incisors. -Non-bleeding erosive gastropathy -Erythematous mucosa in the antrum. Biopsied. -Normal examined duodenum. Biopsied.   10/25/2018 Cancer Staging   Staging form: Colon and Rectum, AJCC 8th Edition - Clinical stage from 10/25/2018: Stage Unknown (cTX, cN0, cM1) - Signed by Truitt Merle, MD on 11/22/2018   10/30/2018 Initial Diagnosis   Cancer of left colon ([place])   10/30/2018 Initial Biopsy   Final Diagnosis: 10/30/18 1. Small intestine-Duedenum, Biospy:   Benign 2.Stomach-Antrum, Biospy:   Chronic inactive Gastritis 3. Large intestine-Sigmoid Colon, Polyp:   Tubular adenomas (2) 4. Large interstine-Descending Colon, Polyp:   Tubular adenoma with high grade dysplasia, suspicious for invasion.  5. Large intestine-transverse colon, Polyp:   Tubulovilous Adenomas (3).  6. Large Intestine-Cecum, Polp:   Tubular adenoma  7.  Large Intestine-Ascending Colon, Polyp:  Tubulovilous Adenoma 8. Large intestine, Biopsy, Mass 55cm:  Invasive moderately  differentiated adenocarcinoma.  9. Large intestine, biopsy, Mass 20cm:   Tubulovilous Adenoma   11/01/2018 Imaging   CT CAP W contrast 11/01/18  IMPRESSION: CT CHEST: 1. Right paratracheal adenopathy (immediately superior to the azygos vein) which short axis dimension of 1.2 cm. In the present clinical setting it is possible this is related to metastatic involvement. 2. Scattered pulmonary parenchymal changes none of which are highly suspicious for metastatic disease. 3. Abnormal appearance of the thyroid gland with left lower lobe ill-defined mass spanning over 3 cm. Thyroid ultrasound can be performed for further delineation. 4. Cardiomegaly. Coronary artery calcification. 5.  Aortic Atherosclerosis (ICD10-I70.0).  CT ABDOMEN PELVIS: 1. Numerous hepatic lesions suspicious for metastatic disease largest within the caudate lobe measuring up to 3.2 cm. 2. Gallbladder wall thickening. This may be related to increased right heart pressure or liver disease but could not exclude cholecystitis in the proper clinical setting. 3. Numerous splenic lesions which in the present clinical setting is suspicious for combination of metastatic disease and splenic cysts. 4. 3.2 cm left adrenal mass suspicious for metastatic disease. 5. Question sigmoid colon and possibly proximal descending colon mass. Rectosigmoid colon mass also not excluded. Correlation with colonoscopy results recommended. 6. Prominent number of colonic diverticula. Third spacing of fluid makes it difficult to evaluate for the possibility of diverticulitis. Radiopaque 1.5 cm structure within the right colon may be related to ingested foreign body. 7. Low-density fatty appearing structures in the external iliac region/pelvic sidewall bilaterally with adjacent low-density iliac lymph nodes and retroperitoneal lymph nodes raises possibility of low-density metastatic adenopathy. Prominent size portacaval lymph node which short  axis dimension of 1.3 cm. PET-CT could be obtained for further delineation if clinically desired. 8. Gas within the urinary bladder may be related to recent manipulation. Clinical correlation recommended. 9. Left adnexal 2.4 cm cyst. This can be assessed with pelvic sonogram.   12/04/2018 - 12/18/2018 Chemotherapy   FOLFOX every 2 weeks starting 12/04/18. Stopped after cycle 2 on 12/18/18 due to SBO which resulted in left sigmoid colectomy. Unfortunately after surgery she developed post-op complications. She had an abcessed that required draininh in 02/2019.       12/07/2018 Imaging   MRI Abdomen 12/07/18  IMPRESSION: 1. The overall improvement with resolution of the number of the hepatic metastatic lesions. The dominant lesion in the caudate lobe is reduced in size from previous 4.0 by 3.3 cm to current 3.8 by 2.7 cm. 2. The splenic lesions are similar to prior and nonspecific. 3. 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 There is a cystic component laterally which is probably incidental rather than from a collision lesion. 4. Hepatic hemochromatosis. 5. 7 mm gallstone in the common bile duct compatible with choledocholithiasis. There also multiple gallstones in the gallbladder. 6.  Aortic Atherosclerosis (ICD10-I70.0). 7. Bosniak category 2 cyst in the right kidney upper pole.   12/27/2018 Surgery   LEFT SIGMOID COLECTOMY WITH HARTMANN POUCH AND END COLOSTOMY by Dr Hassell Done 12/27/18    12/27/2018 Pathology Results   Diagnosis Colon, segmental resection for tumor, distal transverse, descending, sigmoid - INVASIVE MODERATELY DIFFERENTIATED ADENOCARCINOMA, 5.0 CM, CIRCUMFERENTIALLY INVOLVING THE PROXIMAL DESCENDING COLON WITH ASSOCIATED LUMINAL OBSTRUCTION. SEE NOTE. - CARCINOMA INVADES INTO THE PERICOLONIC SOFT TISSUE. - RESECTION MARGINS ARE NEGATIVE FOR CARCINOMA. - NEGATIVE FOR LYMPHOVASCULAR OR PERINEURAL INVASION. - TWENTY-TWO BENIGN LYMPH NODES, NEGATIVE FOR CARCINOMA  (0/22). - SEPARATE LARGE  VILLOUS ADENOMA INVOLVING SIGMOID COLON, 5.0 CM, WITHOUT HIGH GRADE DYSPLASIA OR CARCINOMA. - NON-SPECIFIC CHANGES IN THE PROXIMAL COLON, CONSISTENT WITH DISTAL OBSTRUCTION. - SEE ONCOLOGY TABLE.   12/27/2018 Cancer Staging   Staging form: Colon and Rectum, AJCC 8th Edition - Pathologic stage from 12/27/2018: pT3, pN0, cM1 - Signed by Truitt Merle, MD on 05/02/2019   06/17/2019 Imaging   IMPRESSION: 1. Decrease in overall size of the left anterior abscess cavity containing the percutaneous drain. There remains an abscess cavity measuring 5.7 cm in greatest diameter and containing air and fluid. 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 2. Multiple ill-defined low-density lesions in the liver are again very concerning for metastatic disease. The 4 cm caudate lesion is the most suspicious for a metastatic lesion. Eventual correlation with MRI of the abdomen may be helpful to more accurately assess the extent of metastatic disease. 3. Gradual increase in the amount of free fluid in the peritoneal cavity with slight increase in perihepatic fluid since the prior study but clear gradual increase since prior scans in the last several months. Although there is no clear evidence of progressive carcinomatosis, the persistence and increased prominence of free fluid is concerning for potential malignant ascites. 4. Stable mildly prominent lymphadenopathy in the retroperitoneum, bilateral iliac chains and periportal/portacaval nodal stations.     09/16/2019 Imaging   IMPRESSION: 1. Well-positioned drainage catheter with interval resolution of anterior peritoneal abscess. No undrained isolated collection identified. 2. Slight interval decrease in the volume of peritoneal ascites. 3. Stable central hepatic mass without significant interval change. Lesion remains concerning for metastatic disease. 4. Stable  splenic metastases. 5. Stable left adrenal lesion previously characterized as an adenoma. 6. Additional ancillary findings as above without significant interval change.   12/17/2019 PET scan     IMPRESSION: 1. Hypermetabolic expansile lesion in the caudate lobe of liver consistent with hepatic metastasis. 2. No additional evidence soft tissue metastasis or nodal metastasis. 3. Low metabolic activity associated with low-density LEFT adrenal lesions favored complex benign adenoma. 4. Post colectomy without evidence of local recurrence. No bowel obstruction. 5. Intense activity associated along the RIGHT femoral neck suggests bursitis.     01/20/2020 Imaging   MRI Abdomen    IMPRESSION: 1. Caudate metastasis has enlarged since 2019 but is similar based on comparison CT evaluation from September 16, 2019 no new or progressive disease is identified. 2. Very subtle area of susceptibility along the anterior liver margin may represent an additional site of disease but given the appearance of previous imaging studies this may represent a treated area of disease, particularly when comparing the study to 06/17/2019. 3. Stable multifocal splenic lesions. 4. Cholelithiasis. 5. Signs of partial colectomy with right lower quadrant colostomy. 6. Mild amorphous enhancement along erector spinae musculature on the left is of uncertain significance perhaps related to mild posttraumatic changes, atrophy or mild myositis. Correlate with any symptoms in this area or recent trauma and consider short interval follow-up for further assessment.     03/18/2020 - 04/09/2020 Radiation Therapy   SBRT to liver lesion with Dr Lisbeth Renshaw 03/18/20-04/09/20   08/05/2020 Imaging   MRI abdomen  IMPRESSION: 1. Treated metastatic lesions centered in the caudate lobe of the liver appears very similar to the prior study from 01/20/2020, as detailed above. No new hepatic lesions or other signs of metastatic disease are noted  elsewhere in the abdomen. 2. Biliary sludge and cholelithiasis without evidence of  acute cholecystitis. 3. Small volume of ascites. 4. Colonic diverticulosis. 5. Additional incidental findings, as above.      REVIEW OF SYSTEMS:   Constitutional: Denies fevers, chills Eyes: Denies blurriness of vision Ears, nose, mouth, throat, and face: Denies mucositis or sore throat Respiratory: Denies cough, dyspnea or wheezes Cardiovascular: Denies palpitation, chest discomfort Gastrointestinal: Has ongoing abdominal pain with nausea but no vomiting Skin: Denies abnormal skin rashes Lymphatics: Denies new lymphadenopathy or easy bruising Neurological:Denies numbness, tingling or new weaknesses Behavioral/Psych: Mood is stable, no new changes  Extremities: No lower extremity edema All other systems were reviewed with the patient and are negative.  I have reviewed the past medical history, past surgical history, social history and family history with the patient and they are unchanged from previous note.   PHYSICAL EXAMINATION: ECOG PERFORMANCE STATUS: 2 - Symptomatic, <50% confined to bed  Vitals:   08/29/20 2121 08/30/20 0616  BP: (!) 126/54 (!) 152/62  Pulse: 60 60  Resp: 16 16  Temp: 98.6 F (37 C) 98.2 F (36.8 C)  SpO2: 96% 98%   Filed Weights   08/27/20 1600  Weight: 68.8 kg    Intake/Output from previous day: 10/17 0701 - 10/18 0700 In: 2457.4 [P.O.:837; I.V.:1620.4] Out: 400 [Urine:400]  GENERAL: Awake and alert, no distress SKIN: skin color, texture, turgor are normal, no rashes or significant lesions EYES: normal, Conjunctiva are pink and non-injected, sclera clear OROPHARYNX:no exudate, no erythema and lips, buccal mucosa, and tongue normal  LUNGS: clear to auscultation and percussion with normal breathing effort HEART: regular rate & rhythm and no murmurs and no lower extremity edema ABDOMEN: Positive bowel sounds, soft, denies tenderness with palpation,  colostomy in place NEURO: alert & oriented x 3 with fluent speech, no focal motor/sensory deficits  LABORATORY DATA:  I have reviewed the data as listed CMP Latest Ref Rng & Units 08/29/2020 08/29/2020 08/28/2020  Glucose 70 - 99 mg/dL 152(H) 109(H) 81  BUN 8 - 23 mg/dL 21 13 18   Creatinine 0.44 - 1.00 mg/dL 0.93 0.57 0.73  Sodium 135 - 145 mmol/L 139 138 142  Potassium 3.5 - 5.1 mmol/L 3.8 2.8(L) 3.2(L)  Chloride 98 - 111 mmol/L 105 112(H) 106  CO2 22 - 32 mmol/L 23 17(L) 25  Calcium 8.9 - 10.3 mg/dL 8.2(L) 6.0(LL) 7.8(L)  Total Protein 6.5 - 8.1 g/dL - 4.1(L) 5.2(L)  Total Bilirubin 0.3 - 1.2 mg/dL - 0.8 0.9  Alkaline Phos 38 - 126 U/L - 535(H) 594(H)  AST 15 - 41 U/L - 20 23  ALT 0 - 44 U/L - 23 31    Lab Results  Component Value Date   WBC 5.7 08/29/2020   HGB 10.0 (L) 08/29/2020   HCT 32.1 (L) 08/29/2020   MCV 95.3 08/29/2020   PLT 173 08/29/2020   NEUTROABS 4.5 08/29/2020    MR Abdomen W Wo Contrast  Result Date: 08/06/2020 CLINICAL DATA:  74 year old female with history of colon cancer at the splenic flexure. Evaluate for liver metastases. EXAM: MRI ABDOMEN WITHOUT AND WITH CONTRAST TECHNIQUE: Multiplanar multisequence MR imaging of the abdomen was performed both before and after the administration of intravenous contrast. CONTRAST:  41mL GADAVIST GADOBUTROL 1 MMOL/ML IV SOLN COMPARISON:  Abdominal MRI 01/20/2020. FINDINGS: Lower chest: Unremarkable. Hepatobiliary: Again noted is a lesion which appears centered in the caudate lobe of the liver (axial image 19 of series 25 and coronal image 19 of series 4) measuring 5.1 x 2.6 x 3.6 cm. This lesion is heterogeneous  in signal intensity, but predominantly T1 hypointense and T2 hyperintense, with some peripheral enhancement but is generally hypovascular internally. No other new suspicious hepatic lesions are noted. Subcentimeter T1 hypointense, T2 hyperintense, nonenhancing lesion in segment 2 of the liver is compatible with a tiny  cyst or biliary hamartoma. No intra or extrahepatic biliary ductal dilatation. Tiny well-defined filling defects and some layering amorphous material noted in the dependent portion of the gallbladder, compatible with a combination of biliary sludge and tiny gallstones. Gallbladder is moderately distended without wall thickening. Pancreas: No pancreatic mass. No pancreatic ductal dilatation. No pancreatic or peripancreatic fluid collections or inflammatory changes. Spleen: Multiple nonspecific T2 hyperintense lesions scattered throughout the spleen, stable compared to numerous prior examinations, indeterminate but presumably benign. Adrenals/Urinary Tract: 3.0 x 2.6 x 3.2 cm left adrenal mass (axial image 52 of series 22 and coronal image 19 of series 24) demonstrates loss of signal intensity on out of phase dual echo images, indicative of an adrenal adenoma. Right adrenal gland is normal in appearance. Subcentimeter T1 hypointense, T2 hyperintense, nonenhancing lesion in the interpolar region of the right kidney, compatible with a small simple cyst. Left kidney is normal in appearance. No hydroureteronephrosis in the visualized portions of the abdomen. Stomach/Bowel: A few scattered colonic diverticulae are incidentally noted. Right-sided colostomy. Vascular/Lymphatic: No aneurysm identified in the visualized abdominal vasculature. No lymphadenopathy noted in the abdomen. Other: Small volume of ascites, most evident adjacent to the inferior aspect of the right lobe of the liver. Musculoskeletal: No aggressive appearing osseous lesions are noted in the visualized portions of the skeleton. IMPRESSION: 1. Treated metastatic lesions centered in the caudate lobe of the liver appears very similar to the prior study from 01/20/2020, as detailed above. No new hepatic lesions or other signs of metastatic disease are noted elsewhere in the abdomen. 2. Biliary sludge and cholelithiasis without evidence of acute cholecystitis.  3. Small volume of ascites. 4. Colonic diverticulosis. 5. Additional incidental findings, as above. Electronically Signed   By: Vinnie Langton M.D.   On: 08/06/2020 10:59    Scans performed at outside hospital:  08/25/2020 - CT Abd/pelvis with contrast  IMPRESSION:  1. Cholelithiasis with mild wall thickening and adjacent fluid. This is suspicious for developing cholecystitis. HIDA scan may prove useful for further evaluation.  2. New mild to moderate intrahepatic and high extrahepatic biliary ductal dilation. Given the gallbladder pathology, choledocholithiasis is possible. This would be best evaluated with MRCP.  3. Apparent mild wall thickening of the ascending colon structure just upstream of the ostomy. This may be infectious, inflammatory, or ischemic.  4. Interval decrease in size of indeterminate lesion centered in the caudate of the liver.  5. Small amount of free fluid throughout the abdomen and pelvis.   08/25/2020 - US Gallbladder  IMPRESSION:  1. Gallstones within a mildly distended gallbladder. There is mild gallbladder wall thickening however this is in the setting of ascites and could be reactive. If there is persistent clinical concern for acute cholecystitis consider HIDA scan.  2. Mild biliary ductal dilatation as seen on the comparison CT.   08/26/2020 - MRI abdomen MRCP  IMPRESSION:   -Obstruction of the common duct due to central hepatic metastases at the hilum with mild intrahepatic biliary dilatation and gallbladder and cystic duct distention. Evaluation is limited without contrast.  -No gallbladder wall thickening, but there is gallstones, sludge and debris as well as pericholecystic fluid.  -Cannot evaluate the pulmonary vasculature, at risk for occlusion.  -Splenic lesions and  left adrenal mass concerning for metastases again identified.  -Small-volume ascites fluid around the gallbladder, liver and spleen as well as extending into the pelvis.  -2.0 cm left  adnexal cyst.  -Small bilateral pleural effusions.   08/26/2020 - NM Hepatobiliary Scan  IMPRESSION: Nonvisualization of gallbladder activity throughout the course of the exam. CCK mistakingly given which precludes administration of morphine. However, this represents an abnormal examination of the gallbladder. Acute cholecystitis is not excluded. At the very least, this is compatible with chronic cholecystitis.   ASSESSMENT AND PLAN: 1.  Left colon cancer with liver metastasis 2.  Abdominal pain-due to acute versus chronic cholecystitis and?  CBD obstruction 3.  Normocytic anemia 4.  Elevated alkaline phosphatase 5.  Hypocalcemia 6.  Hypokalemia 7.  Hypertension 8.  History of A. Fib 9.  Diabetes mellitus  -Outside records have been reviewed including imaging studies.  The patient has possible disease progression noted on outside scans.  Scans will be further reviewed by Dr. Burr Medico later today.  Further recommendations for treatment pending this review. -The patient has ongoing abdominal pain which is moderately well controlled with current medications.  CMET performed yesterday showed a normal T bili, AST, and ALT.  Alkaline phosphatase elevated.  Has been seen by general surgery and GI.  T bili remains normal and ERCP with stent placement or percutaneous biliary drain not indicated at this time.  Recommend ongoing monitoring of liver function. -The patient's hemoglobin is overall stable.  She has a history of iron deficiency anemia.  She has received IV iron in the past.  Recommend for her to continue on oral iron once daily. -Ongoing management of electrolyte abnormalities per hospitalist   LOS: 0 days   Mikey Bussing, DNP, AGPCNP-BC, AOCNP 08/30/20  Addendum  I have seen the patient, examined her. I agree with the assessment and and plan and have edited the notes.   Pt is clinically improving, her abdominal pain has resolved.  She still has some nausea, not eating well. I agree  with GI and general surgery, to continue supportive care and hold off ERCP and surgery for now. I have reviewed her recent MRI from 08/05/2020 and her outside MRCP, her liver lesion in caudate is not enlarging by measurement in reports. I do not think she needs additional cancer treatment at this point. I will f/u her in my clinic 2-3 weeks after discharge.   Truitt Merle  08/30/2020

## 2020-08-30 NOTE — Progress Notes (Signed)
[facility] Gastroenterology Progress Note  Cassandra Allen 74 y.o. September 12, 1946  CC:  Abdominal pain, abnormal MRI  Subjective: Patient states her pain is less severe today, currently 5/10.  Pain is located across her upper abdomen.  She states pain worsens by "eating too much." Denies nausea or vomiting.  Currently, tolerating a diet.  ROS : Review of Systems  Cardiovascular: Negative for chest pain and palpitations.  Gastrointestinal: Positive for abdominal pain. Negative for blood in stool, constipation, diarrhea, heartburn, melena, nausea and vomiting.   Objective: Vital signs in last 24 hours: Vitals:   08/29/20 2121 08/30/20 0616  BP: (!) 126/54 (!) 152/62  Pulse: 60 60  Resp: 16 16  Temp: 98.6 F (37 C) 98.2 F (36.8 C)  SpO2: 96% 98%    Physical Exam:  General:  Lethargic, elderly, oriented, cooperative, no acute distress  Head:  Normocephalic, without obvious abnormality, atraumatic  Eyes:  Anicteric sclera, EOMs intact  Lungs:   Clear to auscultation bilaterally, respirations unlabored  Heart:  Regular rate and rhythm, S1, S2 normal; +murmur  Abdomen:   Soft and non-distended with mild RUQ and epigastric tenderness, bowel sounds active all four quadrants, no guarding or peritoneal signs; colostomy in RLQ  Extremities: Extremities normal, atraumatic, no  edema  Pulses: 2+ and symmetric    Lab Results: Recent Labs    08/29/20 1011 08/29/20 1933  NA 138 139  K 2.8* 3.8  CL 112* 105  CO2 17* 23  GLUCOSE 109* 152*  BUN 13 21  CREATININE 0.57 0.93  CALCIUM 6.0* 8.2*   Recent Labs    08/28/20 1156 08/29/20 1011  AST 23 20  ALT 31 23  ALKPHOS 594* 535*  BILITOT 0.9 0.8  PROT 5.2* 4.1*  ALBUMIN 2.4* 1.9*   Recent Labs    08/28/20 1156 08/29/20 0500  WBC 5.5 5.7  NEUTROABS  --  4.5  HGB 9.9* 10.0*  HCT 32.2* 32.1*  MCV 95.8 95.3  PLT 178 173   No results for input(s): LABPROT, INR in the last 72 hours.    Assessment: Abdominal pain,  abnormal MRI in the setting of metastatic colon cancer -MRI 08/26/20: Obstruction of the common duct due to central hepatic metastases at the hilum with mild intrahepatic biliary dilatation and gallbladder and cystic duct distention. Evaluation is limited without contrast. No gallbladder wall thickening, but there is gallstones, sludge and debris as well as pericholecystic fluid. Splenic lesions and left adrenal mass concerning for metastases again identified.  -HIDA scan 08/26/20: Nonvisualization of gallbladder activity.  Acute cholecystitis is not excluded. At the very least, this is compatible with chronic cholecystitis.  -Elevated ALP (535) but otherwise normal LFTs as of 08/29/20  Plan: Surgery following, considering antibiotics vs. IR drain placement vs. Lap chole.  Continue to trend LFTs.  ERCP with stent placement can be completed if needed, though patient currently has a normal bilirubin despite obstruction of CBD due to metastatic disease as seen on MRI.  Salley Slaughter PA-C 08/30/2020, 9:18 AM  Contact #  [phone]

## 2020-08-31 LAB — COMPREHENSIVE METABOLIC PANEL
ALT: 28 U/L (ref 0–44)
AST: 29 U/L (ref 15–41)
Albumin: 2.3 g/dL — ABNORMAL LOW (ref 3.5–5.0)
Alkaline Phosphatase: 597 U/L — ABNORMAL HIGH (ref 38–126)
Anion gap: 5 (ref 5–15)
BUN: 19 mg/dL (ref 8–23)
CO2: 25 mmol/L (ref 22–32)
Calcium: 7.8 mg/dL — ABNORMAL LOW (ref 8.9–10.3)
Chloride: 111 mmol/L (ref 98–111)
Creatinine, Ser: 0.74 mg/dL (ref 0.44–1.00)
GFR, Estimated: >60 mL/min (ref >60)
Glucose, Bld: 82 mg/dL (ref 70–99)
Potassium: 3.9 mmol/L (ref 3.5–5.1)
Sodium: 141 mmol/L (ref 135–145)
Total Bilirubin: 0.7 mg/dL (ref 0.3–1.2)
Total Protein: 4.8 g/dL — ABNORMAL LOW (ref 6.5–8.1)

## 2020-08-31 LAB — GLUCOSE, CAPILLARY
Glucose-Capillary: 102 mg/dL — ABNORMAL HIGH (ref 70–99)
Glucose-Capillary: 191 mg/dL — ABNORMAL HIGH (ref 70–99)
Glucose-Capillary: 109 mg/dL — ABNORMAL HIGH (ref 70–99)
Glucose-Capillary: 177 mg/dL — ABNORMAL HIGH (ref 70–99)

## 2020-08-31 MED ORDER — HYDROMORPHONE HCL 1 MG/ML IJ SOLN
0.5000 mg | INTRAMUSCULAR | Status: DC | PRN
  Administered 2020-08-31 – 2020-09-01 (×3): 0.5 mg via INTRAVENOUS
  Filled 2020-08-31 (×3): qty 0.5

## 2020-08-31 NOTE — TOC Initial Note (Signed)
Transition of Care [facility]) - Initial/Assessment Note    Patient Details  Name: Cassandra Allen MRN: [id number] Date of Birth: Sep 03, 1946  Transition of Care [facility]) CM/SW Contact:    Lynnell Catalan, RN Phone Number: 08/31/2020, 2:49 PM  Clinical Narrative:                 This CM was contacted by [name] [phone] from [facility] ([place] in [place]). [name] states that they manage her health care in the community and can provide transportation services to pt when she is discharged. H&P faxed to [facility] per her request [phone].  TOC will continue to follow.  Expected Discharge Plan: [place] Barriers to Discharge: Continued Medical Work up   Expected Discharge Plan and Services Expected Discharge Plan: [place]   Discharge Planning Services: CM Consult     Prior Living Arrangements/Services   Lives with:: Adult Children Patient language and need for interpreter reviewed:: Yes        Need for Family Participation in Patient Care: Yes (Comment) Care giver support system in place?: Yes (comment)   Criminal Activity/Legal Involvement Pertinent to Current Situation/Hospitalization: No - Comment as needed  Activities of Daily Living Home Assistive Devices/Equipment: Walker (specify type) ADL Screening (condition at time of admission) Patient's cognitive ability adequate to safely complete daily activities?: Yes Is the patient deaf or have difficulty hearing?: No Does the patient have difficulty seeing, even when wearing glasses/contacts?: No Does the patient have difficulty concentrating, remembering, or making decisions?: No Patient able to express need for assistance with ADLs?: Yes Does the patient have difficulty dressing or bathing?: No Independently performs ADLs?: No Communication: Independent Dressing (OT): Independent Grooming: Independent Feeding: Independent Bathing: Independent Toileting: Needs  assistance Is this a change from baseline?: Pre-admission baseline In/Out Bed: Needs assistance Is this a change from baseline?: Pre-admission baseline Walks in Home: Needs assistance Is this a change from baseline?: Pre-admission baseline Does the patient have difficulty walking or climbing stairs?: Yes Weakness of Legs: Both Weakness of Arms/Hands: Both  Permission Sought/Granted                  Emotional Assessment              Admission diagnosis:  Cancer, colon ([place]) [C18.9] Biliary obstruction [K83.1] Patient Active Problem List   Diagnosis Date Noted  . Biliary obstruction 08/27/2020  . Pleural effusion on left 03/14/2019  . Chronic kidney disease (CKD), stage II (mild) 03/11/2019  . Sepsis ([place]) 03/11/2019  . Abscess of abdominal cavity ([place]) 03/11/2019  . Type II diabetes mellitus with renal manifestations ([place]) 03/11/2019  . Hypokalemia 03/11/2019  . Chronic diastolic CHF (congestive heart failure) ([place]) 03/11/2019  . Abnormal LFTs 03/11/2019  . Elevated troponin 03/11/2019  . Severe sepsis ([place]) 03/11/2019  . Fever 02/25/2019  . Atrial fibrillation with RVR ([place])   . Postoperative intra-abdominal abscess 01/31/2019  . Cancer of sigmoid colon  12/27/2018  . History of adenomatous polyp of colon 12/27/2018  . 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 CKD (chronic kidney disease) stage 3, GFR 30-59 ml/min (HCC) 12/27/2018  . Obesity (BMI 30-39.9) 12/27/2018  . Cholelithiasis 12/27/2018  . Choledocholithiasis by MRCP 12/27/2018  . Status post partial colectomy Feb 2020 12/27/2018  . Colonic obstruction due to metastatic colon cancer 12/26/2018  . Anticoagulant long-term use 12/26/2018  . HLD (hyperlipidemia) 12/22/2018  . Atrial fibrillation, chronic ([place]) 12/22/2018  .  Depression, recurrent ([place]) 12/22/2018  . Chest pain 12/22/2018  . Port-A-Cath in place 12/04/2018  . Goals of care, counseling/discussion 11/22/2018  . GI bleed  11/02/2018  . Rectal bleed   . Cancer of splenic flexure of colon 10/30/2018  . Iron deficiency anemia 04/04/2018  . 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 Pain in right ankle and joints of right foot 01/10/2018  . Essential (primary) hypertension 03/25/2015  . Insulin dependent diabetes mellitus 03/25/2015   PCP:  [name], MD Pharmacy:   [id number] [place], [place] [address] [place] [address] Phone: [phone] Fax: [phone]     Social Determinants of Health (SDOH) Interventions    Readmission Risk Interventions Readmission Risk Prevention Plan 03/13/2019  Transportation Screening Complete  Medication Review ([place]) Complete  PCP or Specialist appointment within 3-5 days of discharge Complete  HRI or [place] Not Complete  HRI or Home Care Consult Pt Refusal Comments Plan for discharge to SNF.  SW Recovery Care/Counseling Consult Complete  Palliative Care Screening Not Applicable  Skilled Nursing Facility Complete  Some recent data might be hidden

## 2020-08-31 NOTE — Evaluation (Signed)
Physical Therapy Evaluation Patient Details Name: Cassandra Allen MRN: [id number] DOB: 09/08/1946 Today's Date: 08/31/2020   History of Present Illness  52 female with history of metastatic colon cancer with obstruction status post left sigmoid colectomy and colostomy on 5/80/9983, diastolic congestive heart failure, hypertension, hyperlipidemia, diabetes mellitus type II, permanent A. fib  who presented as a transfer from [facility] for evaluation of abdominal pain/surgical management.  She was found to have elevated alkaline phosphatase and underwent MRCP which showed obstruction of the common bile duct due to central hepatic metastasis with intrahepatic biliary dilation and cystic duct distention  Clinical Impression  Pt admitted with above diagnosis. Mod assist for supine to sit, min assist for sit to stand. Pt ambulated 5' from bed to recliner with RW. Instructed pt in seated UE/LE exercises to be done independently to minimize deconditioning.  Pt currently with functional limitations due to the deficits listed below (see PT Problem List). Pt will benefit from skilled PT to increase their independence and safety with mobility to allow discharge to the venue listed below.       Follow Up Recommendations Home health PT;Supervision for mobility/OOB    Equipment Recommendations  None recommended by PT    Recommendations for Other Services       Precautions / Restrictions Precautions Precautions: Fall Precaution Comments: pt reports ~5 falls in past 1 year ("I trip on stuff") Restrictions Weight Bearing Restrictions: No      Mobility  Bed Mobility Overal bed mobility: Needs Assistance Bed Mobility: Supine to Sit     Supine to sit: Mod assist     General bed mobility comments: mod A to raise trunk  Transfers Overall transfer level: Needs assistance Equipment used: Rolling walker (2 wheeled) Transfers: Sit to/from Stand Sit to Stand: Min assist          General transfer comment: assist to power up, VCs hand placement  Ambulation/Gait Ambulation/Gait assistance: Min guard Gait Distance (Feet): 5 Feet Assistive device: Rolling walker (2 wheeled) Gait Pattern/deviations: Step-through pattern;Decreased stride length Gait velocity: decr   General Gait Details: no loss of balance, pt declined to ambulate farther  [employer] Mobility    Modified Rankin (Stroke Patients Only)       Balance Overall balance assessment: Needs assistance;History of Falls Sitting-balance support: Feet unsupported Sitting balance-Leahy Scale: Good     Standing balance support: Bilateral upper extremity supported Standing balance-Leahy Scale: Fair                               Pertinent Vitals/Pain Pain Assessment: 0-10 Pain Score: 8  Pain Location: upper abdomen Pain Descriptors / Indicators: Aching Pain Intervention(s): Limited activity within patient's tolerance;Monitored during session;Patient requesting pain meds-RN notified    Home Living Family/patient expects to be discharged to:: Private residence Living Arrangements: Children Available Help at Discharge: Family;Available PRN/intermittently Type of Home: House Home Access: Stairs to enter Entrance Stairs-Rails: Left Entrance Stairs-Number of Steps: 2 Home Layout: One level Home Equipment: Walker - 4 wheels;Shower seat;Hand held shower head;Toilet riser;Wheelchair - manual Additional Comments: lives with son and daughter in law who work    Prior Function Level of Independence: Independent with assistive device(s)         Comments: walks with rollater, independent bathing/dressing, pt does not drive     Hand Dominance  Extremity/Trunk Assessment   Upper Extremity Assessment Upper Extremity Assessment: Overall WFL for tasks assessed    Lower Extremity Assessment Lower Extremity Assessment: Overall WFL for tasks assessed     Cervical / Trunk Assessment Cervical / Trunk Assessment: Normal  Communication   Communication: No difficulties  Cognition Arousal/Alertness: Awake/alert Behavior During Therapy: WFL for tasks assessed/performed                                          General Comments      Exercises General Exercises - Upper Extremity Shoulder Flexion: AROM;Both;10 reps;Seated General Exercises - Lower Extremity Long Arc Quad: AROM;Both;10 reps;Seated Hip Flexion/Marching: AROM;Both;10 reps;Seated   Assessment/Plan    PT Assessment Patient needs continued PT services  PT Problem List Decreased activity tolerance;Decreased mobility;Decreased balance       PT Treatment Interventions Gait training;Functional mobility training;Therapeutic exercise;Balance training;Patient/family education;Therapeutic activities    PT Goals (Current goals can be found in the Care Plan section)  Acute Rehab PT Goals Patient Stated Goal: to go home PT Goal Formulation: With patient Time For Goal Achievement: 09/14/20 Potential to Achieve Goals: Good    Frequency Min 3X/week   Barriers to discharge        Co-evaluation               AM-PAC PT "6 Clicks" Mobility  Outcome Measure Help needed turning from your back to your side while in a flat bed without using bedrails?: A Little Help needed moving from lying on your back to sitting on the side of a flat bed without using bedrails?: A Lot Help needed moving to and from a bed to a chair (including a wheelchair)?: A Little Help needed standing up from a chair using your arms (e.g., wheelchair or bedside chair)?: A Little Help needed to walk in hospital room?: A Little Help needed climbing 3-5 steps with a railing? : A Lot 6 Click Score: 16    End of Session Equipment Utilized During Treatment: Gait belt Activity Tolerance: Patient tolerated treatment well Patient left: in chair;with call bell/phone within reach;with chair alarm  set Nurse Communication: Mobility status PT Visit Diagnosis: Difficulty in walking, not elsewhere classified (R26.2);Pain    Time: 1413-1431 PT Time Calculation (min) (ACUTE ONLY): 18 min   Charges:   PT Evaluation $PT Eval Low Complexity: 1 Low          Blondell Reveal Kistler PT 08/31/2020  Acute Rehabilitation Services Pager [phone] Office [phone]

## 2020-08-31 NOTE — Progress Notes (Signed)
PROGRESS NOTE    Cassandra Allen  TDS:[id number] DOB: [birth year] DOA: 08/27/2020 PCP: [name], MD   Chief Complain:  Brief Narrative: Patient is a 25 female with history of metastatic colon cancer with obstruction status post left sigmoid colectomy and colostomy on 2/62/0355, diastolic congestive heart failure, hypertension, hyperlipidemia, diabetes mellitus type II, permanent A. fib  who presented as a transfer from [facility] for evaluation of abdominal pain/surgical management.  She was found to have elevated alkaline phosphatase and underwent MRCP which showed obstruction of the common bile duct due to central hepatic metastasis with intrahepatic biliary dilation and cystic duct distention.  HIDA scan was abnormal and showed chronic cholecystitis.  GI, oncology, general surgery following, the plan is for conservative management.  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  Follows with Dr. Burr Medico. MRCP done in [facility] showed obstruction of the common bile duct due to central hepatic metastasis with intrahepatic dilatation and cystic duct distention.  Imagings also showed a splenic lesions, left adrenal mass concerning for metastatic disease.  HIDA scan was abnormal and showed chronic cholecystitis.  There was no  bladder wall thickening as per imagings.  We will monitor her off antibiotics.  She is afebrile and she did not have leukocytosis. General surgery, oncology and GI were closely following, now all signed off. Since bilirubin remains stable, ERCP with stent placement or percutaneous biliary  drain placement is not indicated as per GI.  Continue to monitor liver function.  General surgery not planning for cholecystectomy either.  Started on ursodiol Oncology recommended to follow-up as an outpatient.  Abdominal pain/possible chronic cholecystitis: Presented with this complaint.  Currently abdominal pain is better and she is tolerating oral diet.  Nausea and vomiting improved.  Colostomy is functioning.  Abdomen is soft and nontender.  Antibiotics not restarted.  Permanent A. fib: Currently rate is controlled.  Currently not on anticoagulation.  On rate control with Lopressor.  Hypertension: Currently Stable.  Currently on Lopressor and amlodipine  Hyperlipidemia: On statin  Diabetes type 2: Continue current insulin regimen.  Monitor blood sugars  Iron deficiency anemia: She had received iron infusion in the past.  Continue oral supplementation.  Severe hypokalemia: Supplemented and corrected.  Generalized weakness: We will request a physical therapy evaluation    Nutrition Problem: Increased nutrient needs Etiology: cancer and cancer related treatments, acute illness      DVT prophylaxis:Heparin [place] Code Status: Full Family Communication: Called and discussed with son on phone on 08/31/2020 Status is: Inpatient  Remains inpatient appropriate because:Inpatient level of care appropriate due to severity of illness   Dispo: The patient is from: Home              Anticipated d/c is to: Home              Anticipated d/c date is: 1-2 days              Patient currently is not medically stable to d/c.     Consultants: GI, general surgery, oncology  Procedures: None  Antimicrobials:  Anti-infectives (From admission, onward)   None      Subjective: Patient seen  and examined at the bedside this morning.  Hemodynamically stable.  She again had abdominal pain today and also vomited once this morning.  During my evaluation, her abdominal pain was little better and  she was looking for some diet order.  I discussed about our plan on conservative management avoiding any procedures.  We are planning to discharge her when her abdominal pain is better.  Objective: Vitals:   08/30/20 0616 08/30/20 1419 08/30/20 2048 08/31/20 0511  BP: (!) 152/62 (!) 158/65 (!) 148/61 (!) 131/50  Pulse: 60 (!) 54 74 67  Resp: 16 16 17 15   Temp: 98.2 F (36.8 C) 98.1 F (36.7 C) 98.4 F (36.9 C) 98.2 F (36.8 C)  TempSrc: Oral Oral Oral Oral  SpO2: 98% 100% 98% 98%  Weight:      Height:        Intake/Output Summary (Last 24 hours) at 08/31/2020 0826 Last data filed at 08/31/2020 0606 Gross per 24 hour  Intake 885 ml  Output 1400 ml  Net -515 ml   Filed Weights   08/27/20 1600  Weight: 68.8 kg    Examination:   General exam:Not in distress,average built.  Chronically ill looking HEENT:PERRL,Oral mucosa moist, Ear/Nose normal on gross exam Respiratory system: Bilateral equal air entry, normal vesicular breath sounds, no wheezes or crackles  Cardiovascular system: S1 & S2 heard, RRR. No JVD, murmurs, rubs, gallops or clicks.  Chemo-Port on the right chest Gastrointestinal system: Abdomen is nondistended, soft and and has mild epigastric tenderness. No organomegaly or masses felt. Normal bowel sounds heard.  Colostomy filled with liquid stool Central nervous system: Alert and oriented. No focal neurological deficits. Extremities: No edema, no clubbing ,no cyanosis, distal peripheral pulses palpable. Skin: No rashes, lesions or ulcers,no icterus ,no pallor     Data Reviewed: I have personally reviewed following labs and imaging studies  CBC: Recent Labs  Lab 08/27/20 1847 08/28/20 1156 08/29/20 0500  WBC 6.2 5.5 5.7  NEUTROABS  --   --  4.5  HGB 9.5* 9.9* 10.0*  HCT 31.1* 32.2* 32.1*  MCV 95.4 95.8 95.3  PLT 166 178 329   Basic Metabolic Panel: Recent Labs  Lab 08/27/20 1847 08/28/20 1156 08/29/20 1011 08/29/20 1933 08/31/20 0603  NA 141  142 138 139 141  K 3.6 3.2* 2.8* 3.8 3.9  CL 108 106 112* 105 111  CO2 24 25 17* 23 25  GLUCOSE 101* 81 109* 152* 82  BUN 25* 18 13 21 19   CREATININE 0.83 0.73 0.57 0.93 0.74  CALCIUM 8.1* 7.8* 6.0* 8.2* 7.8*   GFR: Estimated Creatinine Clearance: 57 mL/min (by C-G formula based on SCr of 0.74 mg/dL). Liver Function Tests: Recent Labs  Lab 08/27/20 1847 08/28/20 1156 08/29/20 1011 08/31/20 0603  AST 25 23 20 29   ALT 37 31 23 28   ALKPHOS 615* 594* 535* 597*  BILITOT 0.9 0.9 0.8 0.7  PROT 5.4* 5.2* 4.1* 4.8*  ALBUMIN 2.6* 2.4* 1.9* 2.3*   No results for input(s): LIPASE, AMYLASE in the last 168 hours. No results for input(s): AMMONIA in the last 168 hours. Coagulation Profile: No results for input(s): INR, PROTIME in the last 168 hours. Cardiac Enzymes: No results for input(s): CKTOTAL, CKMB, CKMBINDEX, TROPONINI in the last 168 hours. BNP (last 3 results) No results for input(s): PROBNP in the last 8760 hours. HbA1C: No results for input(s): HGBA1C in the last 72 hours. CBG: Recent Labs  Lab 08/30/20 0751 08/30/20 1151 08/30/20 1714 08/30/20 2049 08/31/20  0755  GLUCAP 111* 178* 123* 105* 102*   Lipid Profile: No results for input(s): CHOL, HDL, LDLCALC, TRIG, CHOLHDL, LDLDIRECT in the last 72 hours. Thyroid Function Tests: No results for input(s): TSH, T4TOTAL, FREET4, T3FREE, THYROIDAB in the last 72 hours. Anemia Panel: No results for input(s): VITAMINB12, FOLATE, FERRITIN, TIBC, IRON, RETICCTPCT in the last 72 hours. Sepsis Labs: No results for input(s): PROCALCITON, LATICACIDVEN in the last 168 hours.  No results found for this or any previous visit (from the past 240 hour(s)).       Radiology Studies: No results found.      Scheduled Meds: . (feeding supplement) PROSource Plus  30 mL Oral BID BM  . amLODipine  5 mg Oral Daily  . Chlorhexidine Gluconate Cloth  6 each Topical Daily  . feeding supplement  1 Container Oral TID BM  . ferrous  sulfate  325 mg Oral Q breakfast  . heparin  5,000 Units Subcutaneous Q8H  . insulin aspart  0-9 Units Subcutaneous TID WC  . insulin glargine  12 Units Subcutaneous QHS  . metoprolol tartrate  12.5 mg Oral BID  . oxybutynin  10 mg Oral Daily  . QUEtiapine  12.5 mg Oral QHS  . sertraline  100 mg Oral Daily  . simvastatin  10 mg Oral q1800  . ursodiol  300 mg Oral BID   Continuous Infusions:    LOS: 1 day    Time spent: 25 mins.More than 50% of that time was spent in counseling and/or coordination of care.      Shelly Coss, MD [facility] P10/19/2021, 8:26 AM seen

## 2020-08-31 NOTE — Progress Notes (Signed)
[facility] Gastroenterology Progress Note  Cassandra Allen 74 y.o. [birth year]  CC:  Abdominal pain, abnormal MRI  Subjective: Patient states she had severe epigastric and RUQ abdominal pain last night, as well as nausea and vomiting.  She states pain has improved this morning.  No nausea/vomiting thus far today.  ROS : Review of Systems  Cardiovascular: Negative for chest pain and palpitations.  Gastrointestinal: Positive for abdominal pain, nausea and vomiting. Negative for blood in stool, constipation, diarrhea, heartburn and melena.   Objective: Vital signs in last 24 hours: Vitals:   08/30/20 2048 08/31/20 0511  BP: (!) 148/61 (!) 131/50  Pulse: 74 67  Resp: 17 15  Temp: 98.4 F (36.9 C) 98.2 F (36.8 C)  SpO2: 98% 98%    Physical Exam:  General:  Lethargic, elderly, oriented, cooperative, no acute distress  Head:  Normocephalic, without obvious abnormality, atraumatic  Eyes:  Anicteric sclera, EOMs intact  Lungs:   Clear to auscultation bilaterally, respirations unlabored  Heart:  Regular rate and rhythm, S1, S2 normal; +murmur  Abdomen:   Soft and non-distended with mild RUQ and epigastric tenderness, bowel sounds active all four quadrants, no guarding or peritoneal signs; colostomy in RLQ  Extremities: Extremities normal, atraumatic, no  edema  Pulses: 2+ and symmetric    Lab Results: Recent Labs    08/29/20 1933 08/31/20 0603  NA 139 141  K 3.8 3.9  CL 105 111  CO2 23 25  GLUCOSE 152* 82  BUN 21 19  CREATININE 0.93 0.74  CALCIUM 8.2* 7.8*   Recent Labs    08/29/20 1011 08/31/20 0603  AST 20 29  ALT 23 28  ALKPHOS 535* 597*  BILITOT 0.8 0.7  PROT 4.1* 4.8*  ALBUMIN 1.9* 2.3*   Recent Labs    08/28/20 1156 08/29/20 0500  WBC 5.5 5.7  NEUTROABS  --  4.5  HGB 9.9* 10.0*  HCT 32.2* 32.1*  MCV 95.8 95.3  PLT 178 173   No results for input(s): LABPROT, INR in the last 72 hours.    Assessment: Abdominal pain, abnormal MRI in the setting  of metastatic colon cancer -MRI 08/26/20: Obstruction of the common duct due to central hepatic metastases at the hilum with mild intrahepatic biliary dilatation and gallbladder and cystic duct distention. Evaluation is limited without contrast. No gallbladder wall thickening, but there is gallstones, sludge and debris as well as pericholecystic fluid. Splenic lesions and left adrenal mass concerning for metastases again identified.  -HIDA scan 08/26/20: Nonvisualization of gallbladder activity.  Acute cholecystitis is not excluded. At the very least, this is compatible with chronic cholecystitis.  -Elevated ALP (597) but otherwise normal LFTs as of 08/31/20  Plan: Agree with surgical recommendations of conservative management and ursadiol.  No further recommendations from a GI standpoint at this time.  [facility] will sign off.  Please contact us if we can be of any further assistance during this hospital stay.  Salley Slaughter PA-C 08/31/2020, 10:08 AM  Contact #  [phone]

## 2020-09-01 LAB — GLUCOSE, CAPILLARY
Glucose-Capillary: 119 mg/dL — ABNORMAL HIGH (ref 70–99)
Glucose-Capillary: 122 mg/dL — ABNORMAL HIGH (ref 70–99)
Glucose-Capillary: 129 mg/dL — ABNORMAL HIGH (ref 70–99)
Glucose-Capillary: 158 mg/dL — ABNORMAL HIGH (ref 70–99)

## 2020-09-01 MED ORDER — OXYCODONE HCL 5 MG PO TABS: 5.0000 mg | ORAL_TABLET | ORAL | Status: DC | PRN

## 2020-09-01 MED ORDER — METHOCARBAMOL 500 MG PO TABS: 500.0000 mg | ORAL_TABLET | Freq: Four times a day (QID) | ORAL | Status: DC | PRN

## 2020-09-01 MED ORDER — FENTANYL 25 MCG/HR TD PT72
1.0000 | MEDICATED_PATCH | TRANSDERMAL | Status: DC
  Administered 2020-09-01: 1 via TRANSDERMAL
  Filled 2020-09-01: qty 1

## 2020-09-01 MED ORDER — OXYCODONE HCL 5 MG PO TABS
5.0000 mg | ORAL_TABLET | ORAL | Status: DC | PRN
  Administered 2020-09-03: 5 mg via ORAL
  Filled 2020-09-01 (×3): qty 1

## 2020-09-01 MED ORDER — LIDOCAINE 5 % EX PTCH
1.0000 | MEDICATED_PATCH | CUTANEOUS | Status: DC
  Administered 2020-09-01 – 2020-09-02 (×2): 1 via TRANSDERMAL
  Filled 2020-09-01 (×3): qty 1

## 2020-09-01 MED ORDER — OXYCODONE HCL 5 MG PO TABS
5.0000 mg | ORAL_TABLET | ORAL | Status: DC | PRN
Start: 2020-09-01 — End: 2020-09-01

## 2020-09-01 MED ORDER — HYDROMORPHONE HCL 1 MG/ML IJ SOLN
0.5000 mg | INTRAMUSCULAR | Status: DC | PRN
  Administered 2020-09-01 – 2020-09-02 (×2): 0.5 mg via INTRAVENOUS
  Filled 2020-09-01 (×2): qty 0.5

## 2020-09-01 NOTE — Progress Notes (Signed)
[facility] Progress Note     Subjective: CC-  States that she continues to have upper abdominal pain, worse on the right than the left. States that the pain is typically random. It is not necessarily postprandial. No recent n/v. Colostomy functioning. Not eating because she has no appetite. Does not like milk products therefore not drinking the Boost that is ordered. Pain medication helps but when the medicine wears off her pain returns. Unsure how long the pain medication lasts.  Objective: Vital signs in last 24 hours: Temp:  [97.8 F (36.6 C)-98.8 F (37.1 C)] 97.8 F (36.6 C) (10/20 0552) Pulse Rate:  [59-66] 59 (10/20 0552) Resp:  [14-17] 17 (10/20 0552) BP: (126-156)/(42-60) 156/60 (10/20 0552) SpO2:  [97 %-100 %] 100 % (10/20 0552) Last BM Date: 08/31/20  Intake/Output from previous day: 10/19 0701 - 10/20 0700 In: 440 [P.O.:440] Out: 1000 [Urine:1000] Intake/Output this shift: Total I/O In: 0  Out: 400 [Urine:400]  PE: General: Alert, NAD, chronically ill appearing Lungs: rate and effort normal on room air Abd: soft, NT, ND, +BS, colostomy in RLQ with soft stool present  Lab Results:  No results for input(s): WBC, HGB, HCT, PLT in the last 72 hours. BMET Recent Labs    08/29/20 1933 08/31/20 0603  NA 139 141  K 3.8 3.9  CL 105 111  CO2 23 25  GLUCOSE 152* 82  BUN 21 19  CREATININE 0.93 0.74  CALCIUM 8.2* 7.8*   PT/INR No results for input(s): LABPROT, INR in the last 72 hours. CMP     Component Value Date/Time   NA 141 08/31/2020 0603   NA 143 02/14/2019 0000   K 3.9 08/31/2020 0603   CL 111 08/31/2020 0603   CL 101 02/14/2019 0000   CO2 25 08/31/2020 0603   CO2 23 02/14/2019 0000   GLUCOSE 82 08/31/2020 0603   BUN 19 08/31/2020 0603   BUN 25 (A) 02/14/2019 0000   CREATININE 0.74 08/31/2020 0603   CREATININE 1.11 (H) 06/21/2020 1451   CALCIUM 7.8 (L) 08/31/2020 0603   CALCIUM 8.7 02/14/2019 0000   PROT 4.8 (L) 08/31/2020 0603    PROT 5.3 (A) 01/23/2019 0000   PROT 5.3 (A) 01/23/2019 0000   ALBUMIN 2.3 (L) 08/31/2020 0603   ALBUMIN 2.4 01/23/2019 0000   ALBUMIN 2.4 01/23/2019 0000   AST 29 08/31/2020 0603   AST 11 (L) 06/21/2020 1451   ALT 28 08/31/2020 0603   ALT 8 06/21/2020 1451   ALKPHOS 597 (H) 08/31/2020 0603   BILITOT 0.7 08/31/2020 0603   BILITOT 0.3 06/21/2020 1451   GFRNONAA >60 08/31/2020 0603   GFRNONAA 49 (L) 06/21/2020 1451   GFRAA 57 (L) 06/21/2020 1451   Lipase     Component Value Date/Time   LIPASE 38 03/10/2019 2058       Studies/Results: No results found.  Anti-infectives: Anti-infectives (From admission, onward)   None       Assessment/Plan Metastatic colon cancer s/p colostomy  HTN A. Fib - not currently on meds for this T2DM  ?CBD obstruction secondary to metastatic disease - GI following, currently Tbili is normal and Alk Phos stable/elevated Acute vs Chronic cholecystitis  - WBC was WNL and patient is afebrile, CBC ordered for recheck tomorrow - she is completely nontender on abdominal exam this AM, and pain is not postprandial - Her pain may be multifactorial from gallbladder and metastatic disease. I would not recommend lap chole at this time. May need improved pain  med regimen as she is essentially only using dilaudid. Primary team added fentanyl patch this AM. I will add lidocaine patch and robaxin PRN. Use oxycodone over dilaudid. Continue ursodiol.   FEN: soft diet, try Boost Breeze VTE: SQ heparin ID: no current abx   LOS: 2 days    Wellington Hampshire, [facility] Surgery 09/01/2020, 11:11 AM Please see Amion for pager number during day hours 7:00am-4:30pm

## 2020-09-01 NOTE — Progress Notes (Signed)
PROGRESS NOTE    Cassandra Allen  YJE:[id number] DOB: [birth year] DOA: 08/27/2020 PCP: [name], MD   Chief Complain:  Brief Narrative: Patient is a 21 female with history of metastatic colon cancer with obstruction status post left sigmoid colectomy and colostomy on 6/37/8588, diastolic congestive heart failure, hypertension, hyperlipidemia, diabetes mellitus type II, permanent A. fib  who presented as a transfer from [facility] for evaluation of abdominal pain/surgical management.  She was found to have elevated alkaline phosphatase and underwent MRCP which showed obstruction of the common bile duct due to central hepatic metastasis with intrahepatic biliary dilation and cystic duct distention.  HIDA scan was abnormal and showed chronic cholecystitis.  GI, oncology, general surgery following, the plan is for conservative management.  Patient still complaining of right upper quadrant pain.  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  Follows with Dr. Burr Medico. MRCP done in [facility] showed obstruction of the common bile duct due to central hepatic metastasis with intrahepatic dilatation and cystic duct distention.  Imagings also showed a splenic lesions, left adrenal mass concerning for metastatic disease.  HIDA scan was abnormal and showed chronic cholecystitis.  There was no  bladder wall thickening as per imagings.  We are monitoring her  off antibiotics.  She is afebrile and she doesnot not have leukocytosis. General surgery, oncology and GI were closely following, now all signed off. Since bilirubin  remains stable, ERCP with stent placement or percutaneous biliary drain placement is not indicated as per GI.  Continue to monitor liver function.  General surgery not planning for cholecystectomy either.  Started on ursodiol Oncology recommended to follow-up as an outpatient.  Abdominal pain/possible chronic cholecystitis: Presented with this complaint.  Colostomy is functioning.  Antibiotics not restarted.  She has been complaining of severe right upper quadrant pain since last 2 days.  Right upper quadrant is also tender.  Discussed with general surgery and requested for follow-up.  No plan for further intervention.  Pain management is the goal.  Started on fentanyl patch.  Continue as needed IV Dilaudid and oxycodone  Permanent A. fib: Currently rate is controlled.  Currently not on anticoagulation.  On rate control with Lopressor.  Hypertension: Currently Stable.  Currently on Lopressor and amlodipine  Hyperlipidemia: On statin  Diabetes type 2: Continue current insulin regimen.  Monitor blood sugars  Iron deficiency anemia: She had received iron infusion in the past.  Continue oral supplementation.  Severe hypokalemia: Supplemented and corrected.  Generalized weakness: PT recommended home health at discharge.    Nutrition Problem: Increased nutrient needs Etiology: cancer and cancer related treatments, acute illness      DVT prophylaxis:Heparin [place] Code Status: Full Family Communication: Called and discussed with son on phone on 08/31/2020 Status is: Inpatient  Remains inpatient appropriate because:Inpatient level of care appropriate due to severity of illness   Dispo: The patient is from: Home              Anticipated d/c is to: Home              Anticipated d/c date is: 1-2 days  Patient currently is not medically stable to d/c. She is still having significant right upper quadrant pain.    Consultants: GI, general surgery, oncology  Procedures:  None  Antimicrobials:  Anti-infectives (From admission, onward)   None      Subjective: Patient seen and examined at the bedside this morning.  Hemodynamically stable.  Still complaining of right upper quadrant pain and right upper quadrant is tender also.  No nausea vomiting today.  Not ready for discharge yet.  Objective: Vitals:   08/31/20 0511 08/31/20 1547 08/31/20 2107 09/01/20 0552  BP: (!) 131/50 (!) 126/56 (!) 129/42 (!) 156/60  Pulse: 67 61 66 (!) 59  Resp: 15 15 14 17   Temp: 98.2 F (36.8 C) 98.6 F (37 C) 98.8 F (37.1 C) 97.8 F (36.6 C)  TempSrc: Oral Oral Oral Oral  SpO2: 98% 97% 98% 100%  Weight:      Height:        Intake/Output Summary (Last 24 hours) at 09/01/2020 2229 Last data filed at 09/01/2020 0500 Gross per 24 hour  Intake 440 ml  Output 1000 ml  Net -560 ml   Filed Weights   08/27/20 1600  Weight: 68.8 kg    Examination:  General exam: Chronically ill looking, generalized weakness  HEENT:PERRL,Oral mucosa moist, Ear/Nose normal on gross exam Respiratory system: Bilateral equal air entry, normal vesicular breath sounds, no wheezes or crackles  Cardiovascular system: S1 & S2 heard, RRR. No JVD, murmurs, rubs, gallops or clicks.  Chemo-Port on the right chest Gastrointestinal system: Abdomen is nondistended, soft and tender on the right upper quadrant. No organomegaly or masses felt. Normal bowel sounds heard. Central nervous system: Alert and oriented. No focal neurological deficits. Extremities: No edema, no clubbing ,no cyanosis, distal peripheral pulses palpable. Skin: No rashes, lesions or ulcers,no icterus ,no pallor     Data Reviewed: I have personally reviewed following labs and imaging studies  CBC: Recent Labs  Lab 08/27/20 1847 08/28/20 1156 08/29/20 0500  WBC 6.2 5.5 5.7  NEUTROABS  --   --  4.5  HGB 9.5* 9.9* 10.0*  HCT 31.1* 32.2* 32.1*  MCV 95.4 95.8 95.3  PLT 166 178 798   Basic Metabolic Panel: Recent Labs   Lab 08/27/20 1847 08/28/20 1156 08/29/20 1011 08/29/20 1933 08/31/20 0603  NA 141 142 138 139 141  K 3.6 3.2* 2.8* 3.8 3.9  CL 108 106 112* 105 111  CO2 24 25 17* 23 25  GLUCOSE 101* 81 109* 152* 82  BUN 25* 18 13 21 19   CREATININE 0.83 0.73 0.57 0.93 0.74  CALCIUM 8.1* 7.8* 6.0* 8.2* 7.8*   GFR: Estimated Creatinine Clearance: 57 mL/min (by C-G formula based on SCr of 0.74 mg/dL). Liver Function Tests: Recent Labs  Lab 08/27/20 1847 08/28/20 1156 08/29/20 1011 08/31/20 0603  AST 25 23 20 29   ALT 37 31 23 28   ALKPHOS 615* 594* 535* 597*  BILITOT 0.9 0.9 0.8 0.7  PROT 5.4* 5.2* 4.1* 4.8*  ALBUMIN 2.6* 2.4* 1.9* 2.3*   No results for input(s): LIPASE, AMYLASE in the last 168 hours. No results for input(s): AMMONIA in the last 168 hours. Coagulation Profile: No results for input(s): INR, PROTIME in the last 168 hours. Cardiac Enzymes: No results for input(s): CKTOTAL, CKMB, CKMBINDEX, TROPONINI in the last 168 hours. BNP (last 3 results) No results for input(s): PROBNP in the last 8760 hours. HbA1C: No results for input(s): HGBA1C in the last 72 hours. CBG: Recent Labs  Lab  08/31/20 0755 08/31/20 1228 08/31/20 1801 08/31/20 2214 09/01/20 0750  GLUCAP 102* 191* 109* 177* 119*   Lipid Profile: No results for input(s): CHOL, HDL, LDLCALC, TRIG, CHOLHDL, LDLDIRECT in the last 72 hours. Thyroid Function Tests: No results for input(s): TSH, T4TOTAL, FREET4, T3FREE, THYROIDAB in the last 72 hours. Anemia Panel: No results for input(s): VITAMINB12, FOLATE, FERRITIN, TIBC, IRON, RETICCTPCT in the last 72 hours. Sepsis Labs: No results for input(s): PROCALCITON, LATICACIDVEN in the last 168 hours.  No results found for this or any previous visit (from the past 240 hour(s)).       Radiology Studies: No results found.      Scheduled Meds: . (feeding supplement) PROSource Plus  30 mL Oral BID BM  . amLODipine  5 mg Oral Daily  . Chlorhexidine Gluconate  Cloth  6 each Topical Daily  . feeding supplement  1 Container Oral TID BM  . ferrous sulfate  325 mg Oral Q breakfast  . heparin  5,000 Units Subcutaneous Q8H  . insulin aspart  0-9 Units Subcutaneous TID WC  . insulin glargine  12 Units Subcutaneous QHS  . metoprolol tartrate  12.5 mg Oral BID  . oxybutynin  10 mg Oral Daily  . QUEtiapine  12.5 mg Oral QHS  . sertraline  100 mg Oral Daily  . simvastatin  10 mg Oral q1800  . ursodiol  300 mg Oral BID   Continuous Infusions:    LOS: 2 days    Time spent: 25 mins.More than 50% of that time was spent in counseling and/or coordination of care.      Shelly Coss, MD [facility] P10/20/2021, 8:08 AM seen

## 2020-09-02 DIAGNOSIS — K831 Obstruction of bile duct: Secondary | ICD-10-CM | POA: Diagnosis not present

## 2020-09-02 LAB — CBC WITH DIFFERENTIAL/PLATELET
Abs Immature Granulocytes: 0.08 10*3/uL — ABNORMAL HIGH (ref 0.00–0.07)
Basophils Absolute: 0 10*3/uL (ref 0.0–0.1)
Basophils Relative: 0 %
Eosinophils Absolute: 0.1 10*3/uL (ref 0.0–0.5)
Eosinophils Relative: 2 %
HCT: 28.9 % — ABNORMAL LOW (ref 36.0–46.0)
Hemoglobin: 8.9 g/dL — ABNORMAL LOW (ref 12.0–15.0)
Immature Granulocytes: 1 %
Lymphocytes Relative: 5 %
Lymphs Abs: 0.3 10*3/uL — ABNORMAL LOW (ref 0.7–4.0)
MCH: 29.5 pg (ref 26.0–34.0)
MCHC: 30.8 g/dL (ref 30.0–36.0)
MCV: 95.7 fL (ref 80.0–100.0)
Monocytes Absolute: 0.5 10*3/uL (ref 0.1–1.0)
Monocytes Relative: 8 %
Neutro Abs: 5.4 10*3/uL (ref 1.7–7.7)
Neutrophils Relative %: 84 %
Platelets: 167 10*3/uL (ref 150–400)
RBC: 3.02 MIL/uL — ABNORMAL LOW (ref 3.87–5.11)
RDW: 14.3 % (ref 11.5–15.5)
WBC: 6.4 10*3/uL (ref 4.0–10.5)
nRBC: 0 % (ref 0.0–0.2)

## 2020-09-02 LAB — COMPREHENSIVE METABOLIC PANEL
ALT: 87 U/L — ABNORMAL HIGH (ref 0–44)
AST: 96 U/L — ABNORMAL HIGH (ref 15–41)
Albumin: 2.4 g/dL — ABNORMAL LOW (ref 3.5–5.0)
Alkaline Phosphatase: 597 U/L — ABNORMAL HIGH (ref 38–126)
Anion gap: 8 (ref 5–15)
BUN: 14 mg/dL (ref 8–23)
CO2: 26 mmol/L (ref 22–32)
Calcium: 8.3 mg/dL — ABNORMAL LOW (ref 8.9–10.3)
Chloride: 104 mmol/L (ref 98–111)
Creatinine, Ser: 0.73 mg/dL (ref 0.44–1.00)
GFR, Estimated: >60 mL/min (ref >60)
Glucose, Bld: 102 mg/dL — ABNORMAL HIGH (ref 70–99)
Potassium: 3.8 mmol/L (ref 3.5–5.1)
Sodium: 138 mmol/L (ref 135–145)
Total Bilirubin: 1.7 mg/dL — ABNORMAL HIGH (ref 0.3–1.2)
Total Protein: 5.3 g/dL — ABNORMAL LOW (ref 6.5–8.1)

## 2020-09-02 LAB — GLUCOSE, CAPILLARY
Glucose-Capillary: 88 mg/dL (ref 70–99)
Glucose-Capillary: 130 mg/dL — ABNORMAL HIGH (ref 70–99)
Glucose-Capillary: 199 mg/dL — ABNORMAL HIGH (ref 70–99)
Glucose-Capillary: 232 mg/dL — ABNORMAL HIGH (ref 70–99)

## 2020-09-02 NOTE — Progress Notes (Addendum)
Physical Therapy Treatment Patient Details Name: Cassandra Allen MRN: [id number] DOB: Jul 25, 1946 Today's Date: 09/02/2020    History of Present Illness 81 female with history of metastatic colon cancer with obstruction status post left sigmoid colectomy and colostomy on 3/84/6659, diastolic congestive heart failure, hypertension, hyperlipidemia, diabetes mellitus type II, permanent A. fib  who presented as a transfer from [facility] for evaluation of abdominal pain/surgical management.  She was found to have elevated alkaline phosphatase and underwent MRCP which showed obstruction of the common bile duct due to central hepatic metastasis with intrahepatic biliary dilation and cystic duct distention    PT Comments    Pt tolerated increased activity level, she ambulated 24' with RW, no loss of balance, distance limited by fatigue. HHPT recommended.   Follow Up Recommendations  Home health PT;Supervision for mobility/OOB     Equipment Recommendations  None recommended by PT    Recommendations for Other Services       Precautions / Restrictions Precautions Precautions: Fall Precaution Comments: pt reports ~5 falls in past 1 year ("I trip on stuff") Restrictions Weight Bearing Restrictions: No    Mobility  Bed Mobility Overal bed mobility: Needs Assistance Bed Mobility: Supine to Sit     Supine to sit: Min assist     General bed mobility comments: min A to raise trunk and pivot hips to EOB  Transfers Overall transfer level: Needs assistance Equipment used: Rolling walker (2 wheeled) Transfers: Sit to/from Stand Sit to Stand: Min assist;From elevated surface         General transfer comment: assist to power up, VCs hand placement  Ambulation/Gait Ambulation/Gait assistance: Min guard Gait Distance (Feet): 24 Feet Assistive device: Rolling walker (2 wheeled) Gait Pattern/deviations: Step-through pattern;Decreased stride length Gait velocity:  decr   General Gait Details: no loss of balance   Stairs             Wheelchair Mobility    Modified Rankin (Stroke Patients Only)       Balance Overall balance assessment: Needs assistance;History of Falls Sitting-balance support: Feet unsupported Sitting balance-Leahy Scale: Good     Standing balance support: Bilateral upper extremity supported Standing balance-Leahy Scale: Fair                              Cognition Arousal/Alertness: Awake/alert Behavior During Therapy: WFL for tasks assessed/performed;Flat affect Overall Cognitive Status: Within Functional Limits for tasks assessed                                        Exercises      General Comments        Pertinent Vitals/Pain Pain Assessment: No/denies pain    Home Living                      Prior Function            PT Goals (current goals can now be found in the care plan section) Acute Rehab PT Goals Patient Stated Goal: to go home PT Goal Formulation: With patient Time For Goal Achievement: 09/14/20 Potential to Achieve Goals: Good Progress towards PT goals: Progressing toward goals    Frequency    Min 3X/week      PT Plan Current plan remains appropriate    Co-evaluation  AM-PAC PT "6 Clicks" Mobility   Outcome Measure  Help needed turning from your back to your side while in a flat bed without using bedrails?: A Little Help needed moving from lying on your back to sitting on the side of a flat bed without using bedrails?: A Little Help needed moving to and from a bed to a chair (including a wheelchair)?: A Little Help needed standing up from a chair using your arms (e.g., wheelchair or bedside chair)?: A Little Help needed to walk in hospital room?: A Little Help needed climbing 3-5 steps with a railing? : A Lot 6 Click Score: 17    End of Session Equipment Utilized During Treatment: Gait belt Activity  Tolerance: Patient tolerated treatment well Patient left: in chair;with call bell/phone within reach;with chair alarm set Nurse Communication: Mobility status PT Visit Diagnosis: Difficulty in walking, not elsewhere classified (R26.2)     Time: 1340-1402 PT Time Calculation (min) (ACUTE ONLY): 22 min  Charges:  $Gait Training: 8-22 mins                     Blondell Reveal Kistler PT 09/02/2020  Acute Rehabilitation Services Pager [phone] Office [phone]

## 2020-09-02 NOTE — Progress Notes (Addendum)
HEMATOLOGY-ONCOLOGY PROGRESS NOTE  SUBJECTIVE: The patient reported that she had abdominal pain and nausea with vomiting last night.  Abdominal pain somewhat better this morning.  She is currently eating her breakfast at the time my visit.  She has no other complaints today.  Oncology History Overview Note  Cancer Staging Cancer of splenic flexure of colon Staging form: Colon and Rectum, AJCC 8th Edition - Clinical stage from 10/25/2018: Stage Unknown (cTX, cN0, cM1) - Signed by Truitt Merle, MD on 11/22/2018 - Pathologic stage from 12/27/2018: pT3, pN0, cM1 - Signed by Truitt Merle, MD on 05/02/2019    Cancer of splenic flexure of colon  10/25/2018 Procedure   10/25/2018 Colonoscopy Impression: -Two 4 to 6 mm sessile polyps were found in the sigmoid colon. Resected and retrieved. -A 6 mm sessile polyp was found in the descending colon. Resected and retrieved. -Three 5 to 15 mm sessile polyps were found in the transverse colon. Resection and retrieval were complete. -A 12 mm sessile polyp was found in the cecum. Resection and retrieval were complete.  -A 20 mm sessile polyp was found in the ascending colon. Resection and retrieval were complete. . -A malignancy partially obstructing large mass was found at 55 cm proximal to the anus. Biopsied and tattooed. -Malignant partially obstructing tumor 20 cm proximal to the anus. Biopsied and tattooed. -Diverticulosis in the sigmoid colon, in the descending colon and in the transverse colon.    10/25/2018 Imaging   10/25/2018 Endoscopy Impression: -Normal esophagus -Z-line regular 35 cm from the incisors. -Non-bleeding erosive gastropathy -Erythematous mucosa in the antrum. Biopsied. -Normal examined duodenum. Biopsied.   10/25/2018 Cancer Staging   Staging form: Colon and Rectum, AJCC 8th Edition - Clinical stage from 10/25/2018: Stage Unknown (cTX, cN0, cM1) - Signed by Truitt Merle, MD on 11/22/2018   10/30/2018 Initial Diagnosis   Cancer of  left colon ([facility])   10/30/2018 Initial Biopsy   Final Diagnosis: 10/30/18 1. Small intestine-Duedenum, Biospy:   Benign 2.Stomach-Antrum, Biospy:   Chronic inactive Gastritis 3. Large intestine-Sigmoid Colon, Polyp:   Tubular adenomas (2) 4. Large interstine-Descending Colon, Polyp:   Tubular adenoma with high grade dysplasia, suspicious for invasion.  5. Large intestine-transverse colon, Polyp:   Tubulovilous Adenomas (3).  6. Large Intestine-Cecum, Polp:   Tubular adenoma  7. Large Intestine-Ascending Colon, Polyp:  Tubulovilous Adenoma 8. Large intestine, Biopsy, Mass 55cm:  Invasive moderately differentiated adenocarcinoma.  9. Large intestine, biopsy, Mass 20cm:   Tubulovilous Adenoma   11/01/2018 Imaging   CT CAP W contrast 11/01/18  IMPRESSION: CT CHEST: 1. Right paratracheal adenopathy (immediately superior to the azygos vein) which short axis dimension of 1.2 cm. In the present clinical setting it is possible this is related to metastatic involvement. 2. Scattered pulmonary parenchymal changes none of which are highly suspicious for metastatic disease. 3. Abnormal appearance of the thyroid gland with left lower lobe ill-defined mass spanning over 3 cm. Thyroid ultrasound can be performed for further delineation. 4. Cardiomegaly. Coronary artery calcification. 5.  Aortic Atherosclerosis (ICD10-I70.0).  CT ABDOMEN PELVIS: 1. Numerous hepatic lesions suspicious for metastatic disease largest within the caudate lobe measuring up to 3.2 cm. 2. Gallbladder wall thickening. This may be related to increased right heart pressure or liver disease but could not exclude cholecystitis in the proper clinical setting. 3. Numerous splenic lesions which in the present clinical setting is suspicious for combination of metastatic disease and splenic cysts. 4. 3.2 cm left adrenal mass suspicious for metastatic disease. 5. Question sigmoid colon  and possibly proximal descending  colon mass. Rectosigmoid colon mass also not excluded. Correlation with colonoscopy results recommended. 6. Prominent number of colonic diverticula. Third spacing of fluid makes it difficult to evaluate for the possibility of diverticulitis. Radiopaque 1.5 cm structure within the right colon may be related to ingested foreign body. 7. Low-density fatty appearing structures in the external iliac region/pelvic sidewall bilaterally with adjacent low-density iliac lymph nodes and retroperitoneal lymph nodes raises possibility of low-density metastatic adenopathy. Prominent size portacaval lymph node which short axis dimension of 1.3 cm. PET-CT could be obtained for further delineation if clinically desired. 8. Gas within the urinary bladder may be related to recent manipulation. Clinical correlation recommended. 9. Left adnexal 2.4 cm cyst. This can be assessed with pelvic sonogram.   12/04/2018 - 12/18/2018 Chemotherapy   FOLFOX every 2 weeks starting 12/04/18. Stopped after cycle 2 on 12/18/18 due to SBO which resulted in left sigmoid colectomy. Unfortunately after surgery she developed post-op complications. She had an abcessed that required draininh in 02/2019.       12/07/2018 Imaging   MRI Abdomen 12/07/18  IMPRESSION: 1. The overall improvement with resolution of the number of the hepatic metastatic lesions. The dominant lesion in the caudate lobe is reduced in size from previous 4.0 by 3.3 cm to current 3.8 by 2.7 cm. 2. The splenic lesions are similar to prior and nonspecific. 3. 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 There is a cystic component laterally which is probably incidental rather than from a collision lesion. 4. Hepatic hemochromatosis. 5. 7 mm gallstone in the common bile duct compatible with choledocholithiasis. There also multiple gallstones in the gallbladder. 6.  Aortic Atherosclerosis (ICD10-I70.0). 7. Bosniak category 2 cyst in the right kidney  upper pole.   12/27/2018 Surgery   LEFT SIGMOID COLECTOMY WITH HARTMANN POUCH AND END COLOSTOMY by Dr Hassell Done 12/27/18    12/27/2018 Pathology Results   Diagnosis Colon, segmental resection for tumor, distal transverse, descending, sigmoid - INVASIVE MODERATELY DIFFERENTIATED ADENOCARCINOMA, 5.0 CM, CIRCUMFERENTIALLY INVOLVING THE PROXIMAL DESCENDING COLON WITH ASSOCIATED LUMINAL OBSTRUCTION. SEE NOTE. - CARCINOMA INVADES INTO THE PERICOLONIC SOFT TISSUE. - RESECTION MARGINS ARE NEGATIVE FOR CARCINOMA. - NEGATIVE FOR LYMPHOVASCULAR OR PERINEURAL INVASION. - TWENTY-TWO BENIGN LYMPH NODES, NEGATIVE FOR CARCINOMA (0/22). - SEPARATE LARGE VILLOUS ADENOMA INVOLVING SIGMOID COLON, 5.0 CM, WITHOUT HIGH GRADE DYSPLASIA OR CARCINOMA. - NON-SPECIFIC CHANGES IN THE PROXIMAL COLON, CONSISTENT WITH DISTAL OBSTRUCTION. - SEE ONCOLOGY TABLE.   12/27/2018 Cancer Staging   Staging form: Colon and Rectum, AJCC 8th Edition - Pathologic stage from 12/27/2018: pT3, pN0, cM1 - Signed by Truitt Merle, MD on 05/02/2019   06/17/2019 Imaging   IMPRESSION: 1. Decrease in overall size of the left anterior abscess cavity containing the percutaneous drain. There remains an abscess cavity measuring 5.7 cm in greatest diameter and containing air and fluid. 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 2. Multiple ill-defined low-density lesions in the liver are again very concerning for metastatic disease. The 4 cm caudate lesion is the most suspicious for a metastatic lesion. Eventual correlation with MRI of the abdomen may be helpful to more accurately assess the extent of metastatic disease. 3. Gradual increase in the amount of free fluid in the peritoneal cavity with slight increase in perihepatic fluid since the prior study but clear gradual increase since prior scans in the last several months. Although there is no clear evidence of  progressive  carcinomatosis, the persistence and increased prominence of free fluid is concerning for potential malignant ascites. 4. Stable mildly prominent lymphadenopathy in the retroperitoneum, bilateral iliac chains and periportal/portacaval nodal stations.     09/16/2019 Imaging   IMPRESSION: 1. Well-positioned drainage catheter with interval resolution of anterior peritoneal abscess. No undrained isolated collection identified. 2. Slight interval decrease in the volume of peritoneal ascites. 3. Stable central hepatic mass without significant interval change. Lesion remains concerning for metastatic disease. 4. Stable splenic metastases. 5. Stable left adrenal lesion previously characterized as an adenoma. 6. Additional ancillary findings as above without significant interval change.   12/17/2019 PET scan     IMPRESSION: 1. Hypermetabolic expansile lesion in the caudate lobe of liver consistent with hepatic metastasis. 2. No additional evidence soft tissue metastasis or nodal metastasis. 3. Low metabolic activity associated with low-density LEFT adrenal lesions favored complex benign adenoma. 4. Post colectomy without evidence of local recurrence. No bowel obstruction. 5. Intense activity associated along the RIGHT femoral neck suggests bursitis.     01/20/2020 Imaging   MRI Abdomen    IMPRESSION: 1. Caudate metastasis has enlarged since 2019 but is similar based on comparison CT evaluation from September 16, 2019 no new or progressive disease is identified. 2. Very subtle area of susceptibility along the anterior liver margin may represent an additional site of disease but given the appearance of previous imaging studies this may represent a treated area of disease, particularly when comparing the study to 06/17/2019. 3. Stable multifocal splenic lesions. 4. Cholelithiasis. 5. Signs of partial colectomy with right lower quadrant colostomy. 6. Mild amorphous  enhancement along erector spinae musculature on the left is of uncertain significance perhaps related to mild posttraumatic changes, atrophy or mild myositis. Correlate with any symptoms in this area or recent trauma and consider short interval follow-up for further assessment.     03/18/2020 - 04/09/2020 Radiation Therapy   SBRT to liver lesion with Dr Lisbeth Renshaw 03/18/20-04/09/20   08/05/2020 Imaging   MRI abdomen  IMPRESSION: 1. Treated metastatic lesions centered in the caudate lobe of the liver appears very similar to the prior study from 01/20/2020, as detailed above. No new hepatic lesions or other signs of metastatic disease are noted elsewhere in the abdomen. 2. Biliary sludge and cholelithiasis without evidence of acute cholecystitis. 3. Small volume of ascites. 4. Colonic diverticulosis. 5. Additional incidental findings, as above.      REVIEW OF SYSTEMS:   Constitutional: Denies fevers, chills Eyes: Denies blurriness of vision Ears, nose, mouth, throat, and face: Denies mucositis or sore throat Respiratory: Denies cough, dyspnea or wheezes Cardiovascular: Denies palpitation, chest discomfort Gastrointestinal: Abdominal discomfort better this morning without any nausea or vomiting this morning Skin: Denies abnormal skin rashes Lymphatics: Denies new lymphadenopathy or easy bruising Neurological:Denies numbness, tingling or new weaknesses Behavioral/Psych: Mood is stable, no new changes  Extremities: No lower extremity edema All other systems were reviewed with the patient and are negative.  I have reviewed the past medical history, past surgical history, social history and family history with the patient and they are unchanged from previous note.   PHYSICAL EXAMINATION: ECOG PERFORMANCE STATUS: 2 - Symptomatic, <50% confined to bed  Vitals:   09/01/20 2021 09/02/20 0504  BP: (!) 147/56 (!) 152/59  Pulse: (!) 58 (!) 59  Resp: 17 17  Temp: 98.6 F (37 C) 98.4 F (36.9  C)  SpO2: 96% 100%   Filed Weights   08/27/20 1600  Weight: 68.8 kg    Intake/Output  from previous day: 10/20 0701 - 10/21 0700 In: 120 [P.O.:120] Out: 900 [Urine:900]  GENERAL: Awake and alert, no distress SKIN: skin color, texture, turgor are normal, no rashes or significant lesions EYES: normal, Conjunctiva are pink and non-injected, sclera clear OROPHARYNX:no exudate, no erythema and lips, buccal mucosa, and tongue normal  LUNGS: clear to auscultation and percussion with normal breathing effort HEART: regular rate & rhythm and no murmurs and no lower extremity edema ABDOMEN: Positive bowel sounds, soft, denies tenderness with palpation, colostomy in place NEURO: alert & oriented x 3 with fluent speech, no focal motor/sensory deficits  LABORATORY DATA:  I have reviewed the data as listed CMP Latest Ref Rng & Units 09/02/2020 08/31/2020 08/29/2020  Glucose 70 - 99 mg/dL 102(H) 82 152(H)  BUN 8 - 23 mg/dL 14 19 21   Creatinine 0.44 - 1.00 mg/dL 0.73 0.74 0.93  Sodium 135 - 145 mmol/L 138 141 139  Potassium 3.5 - 5.1 mmol/L 3.8 3.9 3.8  Chloride 98 - 111 mmol/L 104 111 105  CO2 22 - 32 mmol/L 26 25 23   Calcium 8.9 - 10.3 mg/dL 8.3(L) 7.8(L) 8.2(L)  Total Protein 6.5 - 8.1 g/dL 5.3(L) 4.8(L) -  Total Bilirubin 0.3 - 1.2 mg/dL 1.7(H) 0.7 -  Alkaline Phos 38 - 126 U/L 597(H) 597(H) -  AST 15 - 41 U/L 96(H) 29 -  ALT 0 - 44 U/L 87(H) 28 -    Lab Results  Component Value Date   WBC 6.4 09/02/2020   HGB 8.9 (L) 09/02/2020   HCT 28.9 (L) 09/02/2020   MCV 95.7 09/02/2020   PLT 167 09/02/2020   NEUTROABS 5.4 09/02/2020    MR Abdomen W Wo Contrast  Result Date: 08/06/2020 CLINICAL DATA:  74 year old female with history of colon cancer at the splenic flexure. Evaluate for liver metastases. EXAM: MRI ABDOMEN WITHOUT AND WITH CONTRAST TECHNIQUE: Multiplanar multisequence MR imaging of the abdomen was performed both before and after the administration of intravenous contrast.  CONTRAST:  73m GADAVIST GADOBUTROL 1 MMOL/ML IV SOLN COMPARISON:  Abdominal MRI 01/20/2020. FINDINGS: Lower chest: Unremarkable. Hepatobiliary: Again noted is a lesion which appears centered in the caudate lobe of the liver (axial image 19 of series 25 and coronal image 19 of series 4) measuring 5.1 x 2.6 x 3.6 cm. This lesion is heterogeneous in signal intensity, but predominantly T1 hypointense and T2 hyperintense, with some peripheral enhancement but is generally hypovascular internally. No other new suspicious hepatic lesions are noted. Subcentimeter T1 hypointense, T2 hyperintense, nonenhancing lesion in segment 2 of the liver is compatible with a tiny cyst or biliary hamartoma. No intra or extrahepatic biliary ductal dilatation. Tiny well-defined filling defects and some layering amorphous material noted in the dependent portion of the gallbladder, compatible with a combination of biliary sludge and tiny gallstones. Gallbladder is moderately distended without wall thickening. Pancreas: No pancreatic mass. No pancreatic ductal dilatation. No pancreatic or peripancreatic fluid collections or inflammatory changes. Spleen: Multiple nonspecific T2 hyperintense lesions scattered throughout the spleen, stable compared to numerous prior examinations, indeterminate but presumably benign. Adrenals/Urinary Tract: 3.0 x 2.6 x 3.2 cm left adrenal mass (axial image 52 of series 22 and coronal image 19 of series 24) demonstrates loss of signal intensity on out of phase dual echo images, indicative of an adrenal adenoma. Right adrenal gland is normal in appearance. Subcentimeter T1 hypointense, T2 hyperintense, nonenhancing lesion in the interpolar region of the right kidney, compatible with a small simple cyst. Left kidney is normal in appearance.  No hydroureteronephrosis in the visualized portions of the abdomen. Stomach/Bowel: A few scattered colonic diverticulae are incidentally noted. Right-sided colostomy.  Vascular/Lymphatic: No aneurysm identified in the visualized abdominal vasculature. No lymphadenopathy noted in the abdomen. Other: Small volume of ascites, most evident adjacent to the inferior aspect of the right lobe of the liver. Musculoskeletal: No aggressive appearing osseous lesions are noted in the visualized portions of the skeleton. IMPRESSION: 1. Treated metastatic lesions centered in the caudate lobe of the liver appears very similar to the prior study from 01/20/2020, as detailed above. No new hepatic lesions or other signs of metastatic disease are noted elsewhere in the abdomen. 2. Biliary sludge and cholelithiasis without evidence of acute cholecystitis. 3. Small volume of ascites. 4. Colonic diverticulosis. 5. Additional incidental findings, as above. Electronically Signed   By: Vinnie Langton M.D.   On: 08/06/2020 10:59    Scans performed at outside hospital:  08/25/2020 - CT Abd/pelvis with contrast  IMPRESSION:  1. Cholelithiasis with mild wall thickening and adjacent fluid. This is suspicious for developing cholecystitis. HIDA scan may prove useful for further evaluation.  2. New mild to moderate intrahepatic and high extrahepatic biliary ductal dilation. Given the gallbladder pathology, choledocholithiasis is possible. This would be best evaluated with MRCP.  3. Apparent mild wall thickening of the ascending colon structure just upstream of the ostomy. This may be infectious, inflammatory, or ischemic.  4. Interval decrease in size of indeterminate lesion centered in the caudate of the liver.  5. Small amount of free fluid throughout the abdomen and pelvis.   08/25/2020 - US Gallbladder  IMPRESSION:  1. Gallstones within a mildly distended gallbladder. There is mild gallbladder wall thickening however this is in the setting of ascites and could be reactive. If there is persistent clinical concern for acute cholecystitis consider HIDA scan.  2. Mild biliary ductal  dilatation as seen on the comparison CT.   08/26/2020 - MRI abdomen MRCP  IMPRESSION:   -Obstruction of the common duct due to central hepatic metastases at the hilum with mild intrahepatic biliary dilatation and gallbladder and cystic duct distention. Evaluation is limited without contrast.  -No gallbladder wall thickening, but there is gallstones, sludge and debris as well as pericholecystic fluid.  -Cannot evaluate the pulmonary vasculature, at risk for occlusion.  -Splenic lesions and left adrenal mass concerning for metastases again identified.  -Small-volume ascites fluid around the gallbladder, liver and spleen as well as extending into the pelvis.  -2.0 cm left adnexal cyst.  -Small bilateral pleural effusions.   08/26/2020 - NM Hepatobiliary Scan  IMPRESSION: Nonvisualization of gallbladder activity throughout the course of the exam. CCK mistakingly given which precludes administration of morphine. However, this represents an abnormal examination of the gallbladder. Acute cholecystitis is not excluded. At the very least, this is compatible with chronic cholecystitis.   ASSESSMENT AND PLAN: 1.  Left colon cancer with liver metastasis 2.  Abdominal pain-due to acute versus chronic cholecystitis and?  CBD obstruction 3.  Normocytic anemia 4.  Elevated alkaline phosphatase 5.  Hypocalcemia, corrected calcium normal 6.  Hypokalemia, resolved 7.  Hypertension 8.  History of A. Fib 9.  Diabetes mellitus  -Continues to have intermittent abdominal pain with nausea and vomiting.  Fentanyl patch has been started and she remains on IV Dilaudid and oxycodone. -T bili is up this morning with a stable alk phos and now with elevation of AST and ALT.  General surgery is following with no immediate plans for surgery.  Remains  on ursodiol and has been started on Robaxin today.  Monitor liver function closely. -Hemoglobin is down slightly today.  She has no evidence of bleeding.  She has a  history of iron deficiency anemia.  She has received IV iron in the past.  Recommend for her to continue on oral iron once daily.  No transfusion is indicated at this time.   LOS: 3 days   Mikey Bussing, DNP, AGPCNP-BC, AOCNP 09/02/20  I have seen the patient, examined her. I agree with the assessment and and plan and have edited the notes.   Pt had some pain and nausea last night, better today, she is concerned that this will happen to her again at home. We reviewed that this is certainly possible, due to her chronic cholecystitis.  Lab reviewed, total bilirubin slightly elevated this morning.  If labs stable tomorrow, hope she can be discharged home in the next few days. I will set up f/u in a few weeks.   Truitt Merle  09/02/2020

## 2020-09-02 NOTE — Progress Notes (Signed)
PROGRESS NOTE    Cassandra Allen  GGY:[id number] DOB: 1946-01-09 DOA: 08/27/2020 PCP: [name], MD   Chief Complain:  Brief Narrative: Patient is a 25 female with history of metastatic colon cancer with obstruction status post left sigmoid colectomy and colostomy on 0/35/0093, diastolic congestive heart failure, hypertension, hyperlipidemia, diabetes mellitus type II, permanent A. fib  who presented as a transfer from [facility] for evaluation of abdominal pain/surgical management.  She was found to have elevated alkaline phosphatase and underwent MRCP which showed obstruction of the common bile duct due to central hepatic metastasis with intrahepatic biliary dilation and cystic duct distention.  HIDA scan was abnormal and showed chronic cholecystitis.  GI, oncology, general surgery following, the plan is for conservative management.  Hospital course remarkable for persistent pain requiring IV pain medications and prolonged hospital stay.  Plan for discharge home tomorrow if pain improves.  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  Follows with Dr. Burr Medico. MRCP done in [facility] showed obstruction of the common bile duct due to central hepatic metastasis with intrahepatic dilatation and cystic duct distention.  Imagings also showed a splenic lesions, left adrenal mass concerning for metastatic disease.  HIDA scan was abnormal and showed chronic cholecystitis.  There was no  bladder wall thickening as per imagings.  We are monitoring her  off antibiotics.  She is afebrile and she doesnot not have  leukocytosis. General surgery, oncology and GI were closely following, now all signed off. Since bilirubin remains stable, ERCP with stent placement or percutaneous biliary drain placement is not indicated as per GI.  Continue to monitor liver function.  General surgery not planning for cholecystectomy either.  Started on ursodiol Oncology recommended to follow-up as an outpatient.  Abdominal pain/possible chronic cholecystitis: Presented with this complaint.  Colostomy is functioning.  Antibiotics not restarted.    Right upper quadrant was also tender.  .  No plan for further intervention a per surgery.  Pain management is the goal.  Started on fentanyl patch.  Continue as needed IV Dilaudid and oxycodone.  Pain is little better today but she still does not feel ready go home.  Permanent A. fib: Currently rate is controlled.  Currently not on anticoagulation.  On rate control with Lopressor.  Hypertension: Currently Stable.  Currently on Lopressor and amlodipine  Hyperlipidemia: On statin  Diabetes type 2: Continue current insulin regimen.  Monitor blood sugars  Iron deficiency anemia: She had received iron infusion in the past.  Continue oral supplementation.  Severe hypokalemia: Supplemented and corrected.  Generalized weakness: PT recommended home health at discharge.    Nutrition Problem: Increased nutrient needs Etiology: cancer and cancer related treatments, acute illness      DVT prophylaxis:Heparin [place] Code Status: Full Family Communication: Discussed with daughter-in-law at bedside.   Status is: Inpatient  Remains inpatient appropriate because:Inpatient level of care appropriate due to severity of illness   Dispo: The patient is from: Home              Anticipated d/c is to: Home              Anticipated d/c date is:  1-2 days              Patient currently is not medically stable to d/c. She is still having significant right upper quadrant pain.    Consultants:  GI, general surgery, oncology  Procedures: None  Antimicrobials:  Anti-infectives (From admission, onward)   None      Subjective:  Patient seen and examined at the bedside this morning.  She looks much better today.  Pain is better than yesterday after started on fentanyl patch.  Daughter in law was at the bedside.  See feels that she is not ready for today and she wants to stay 1 more night for pain control.  She is hopeful that she will be able to go home tomorrow  Objective: Vitals:   09/01/20 1627 09/01/20 2021 09/02/20 0504 09/02/20 1126  BP: (!) 163/63 (!) 147/56 (!) 152/59 (!) 143/61  Pulse: 62 (!) 58 (!) 59 70  Resp: 15 17 17    Temp: 98.5 F (36.9 C) 98.6 F (37 C) 98.4 F (36.9 C)   TempSrc: Oral Oral Oral   SpO2: 99% 96% 100%   Weight:      Height:        Intake/Output Summary (Last 24 hours) at 09/02/2020 1253 Last data filed at 09/01/2020 1330 Gross per 24 hour  Intake 120 ml  Output 500 ml  Net -380 ml   Filed Weights   08/27/20 1600  Weight: 68.8 kg    Examination:   General exam: In significant distress, chronically looking, weak HEENT:PERRL,Oral mucosa moist, Ear/Nose normal on gross exam Respiratory system: Bilateral equal air entry, normal vesicular breath sounds, no wheezes or crackles  Cardiovascular system: S1 & S2 heard, RRR. No JVD, murmurs, rubs, gallops or clicks. Gastrointestinal system: Abdomen is nondistended, soft and has some RUQ tenderness. No organomegaly or masses felt. Normal bowel sounds heard. Central nervous system: Alert and oriented. No focal neurological deficits. Extremities: No edema, no clubbing ,no cyanosis Skin: No rashes, lesions or ulcers,no icterus ,no pallor    Data Reviewed: I have personally reviewed following labs and imaging studies  CBC: Recent Labs  Lab 08/27/20 1847 08/28/20 1156 08/29/20 0500 09/02/20 0605  WBC 6.2 5.5 5.7 6.4  NEUTROABS  --   --  4.5 5.4  HGB 9.5* 9.9* 10.0* 8.9*  HCT 31.1*  32.2* 32.1* 28.9*  MCV 95.4 95.8 95.3 95.7  PLT 166 178 173 416   Basic Metabolic Panel: Recent Labs  Lab 08/28/20 1156 08/29/20 1011 08/29/20 1933 08/31/20 0603 09/02/20 0605  NA 142 138 139 141 138  K 3.2* 2.8* 3.8 3.9 3.8  CL 106 112* 105 111 104  CO2 25 17* 23 25 26   GLUCOSE 81 109* 152* 82 102*  BUN 18 13 21 19 14   CREATININE 0.73 0.57 0.93 0.74 0.73  CALCIUM 7.8* 6.0* 8.2* 7.8* 8.3*   GFR: Estimated Creatinine Clearance: 57 mL/min (by C-G formula based on SCr of 0.73 mg/dL). Liver Function Tests: Recent Labs  Lab 08/27/20 1847 08/28/20 1156 08/29/20 1011 08/31/20 0603 09/02/20 0605  AST 25 23 20 29  96*  ALT 37 31 23 28  87*  ALKPHOS 615* 594* 535* 597* 597*  BILITOT 0.9 0.9 0.8 0.7 1.7*  PROT 5.4* 5.2* 4.1* 4.8* 5.3*  ALBUMIN 2.6* 2.4* 1.9* 2.3* 2.4*   No results for input(s): LIPASE, AMYLASE in the last 168 hours. No results for input(s): AMMONIA in the last 168 hours. Coagulation Profile: No results for input(s): INR, PROTIME in the  last 168 hours. Cardiac Enzymes: No results for input(s): CKTOTAL, CKMB, CKMBINDEX, TROPONINI in the last 168 hours. BNP (last 3 results) No results for input(s): PROBNP in the last 8760 hours. HbA1C: No results for input(s): HGBA1C in the last 72 hours. CBG: Recent Labs  Lab 09/01/20 1229 09/01/20 1722 09/01/20 2202 09/02/20 0800 09/02/20 1204  GLUCAP 122* 129* 158* 88 130*   Lipid Profile: No results for input(s): CHOL, HDL, LDLCALC, TRIG, CHOLHDL, LDLDIRECT in the last 72 hours. Thyroid Function Tests: No results for input(s): TSH, T4TOTAL, FREET4, T3FREE, THYROIDAB in the last 72 hours. Anemia Panel: No results for input(s): VITAMINB12, FOLATE, FERRITIN, TIBC, IRON, RETICCTPCT in the last 72 hours. Sepsis Labs: No results for input(s): PROCALCITON, LATICACIDVEN in the last 168 hours.  No results found for this or any previous visit (from the past 240 hour(s)).       Radiology Studies: No results  found.      Scheduled Meds: . (feeding supplement) PROSource Plus  30 mL Oral BID BM  . amLODipine  5 mg Oral Daily  . Chlorhexidine Gluconate Cloth  6 each Topical Daily  . feeding supplement  1 Container Oral TID BM  . fentaNYL  1 patch Transdermal Q72H  . ferrous sulfate  325 mg Oral Q breakfast  . heparin  5,000 Units Subcutaneous Q8H  . insulin aspart  0-9 Units Subcutaneous TID WC  . insulin glargine  12 Units Subcutaneous QHS  . lidocaine  1 patch Transdermal Q24H  . metoprolol tartrate  12.5 mg Oral BID  . oxybutynin  10 mg Oral Daily  . QUEtiapine  12.5 mg Oral QHS  . sertraline  100 mg Oral Daily  . simvastatin  10 mg Oral q1800  . ursodiol  300 mg Oral BID   Continuous Infusions:    LOS: 3 days    Time spent: 25 mins.More than 50% of that time was spent in counseling and/or coordination of care.      Shelly Coss, MD [facility] P10/21/2021, 12:53 PM seen

## 2020-09-02 NOTE — Progress Notes (Signed)
[facility] Progress Note     Subjective: CC-  Sitting up in bed eating breakfast. States that she had a lot of indigestion yesterday after eating spaghetti for dinner. She vomited once. Since that she she has felt fine. No abdominal complaints. Denies any nausea. Tolerating biscuit and eggs for breakfast. WBC 6.4, afebrile LFTs did trend up with AST 96, ALT 87, Alk phos 597, Tbili 1.7.  Objective: Vital signs in last 24 hours: Temp:  [98.4 F (36.9 C)-98.6 F (37 C)] 98.4 F (36.9 C) (10/21 0504) Pulse Rate:  [57-62] 59 (10/21 0504) Resp:  [15-18] 17 (10/21 0504) BP: (147-163)/(56-63) 152/59 (10/21 0504) SpO2:  [96 %-100 %] 100 % (10/21 0504) Last BM Date: 09/01/20  Intake/Output from previous day: 10/20 0701 - 10/21 0700 In: 120 [P.O.:120] Out: 900 [Urine:900] Intake/Output this shift: No intake/output data recorded.  PE: General: Alert, NAD, chronically ill appearing Lungs: rate and effort normal on room air Abd: soft, NT, ND, +BS,colostomy in RLQ with soft stool present  Lab Results:  Recent Labs    09/02/20 0605  WBC 6.4  HGB 8.9*  HCT 28.9*  PLT 167   BMET Recent Labs    08/31/20 0603 09/02/20 0605  NA 141 138  K 3.9 3.8  CL 111 104  CO2 25 26  GLUCOSE 82 102*  BUN 19 14  CREATININE 0.74 0.73  CALCIUM 7.8* 8.3*   PT/INR No results for input(s): LABPROT, INR in the last 72 hours. CMP     Component Value Date/Time   NA 138 09/02/2020 0605   NA 143 02/14/2019 0000   K 3.8 09/02/2020 0605   CL 104 09/02/2020 0605   CL 101 02/14/2019 0000   CO2 26 09/02/2020 0605   CO2 23 02/14/2019 0000   GLUCOSE 102 (H) 09/02/2020 0605   BUN 14 09/02/2020 0605   BUN 25 (A) 02/14/2019 0000   CREATININE 0.73 09/02/2020 0605   CREATININE 1.11 (H) 06/21/2020 1451   CALCIUM 8.3 (L) 09/02/2020 0605   CALCIUM 8.7 02/14/2019 0000   PROT 5.3 (L) 09/02/2020 0605   PROT 5.3 (A) 01/23/2019 0000   PROT 5.3 (A) 01/23/2019 0000   ALBUMIN 2.4 (L)  09/02/2020 0605   ALBUMIN 2.4 01/23/2019 0000   ALBUMIN 2.4 01/23/2019 0000   AST 96 (H) 09/02/2020 0605   AST 11 (L) 06/21/2020 1451   ALT 87 (H) 09/02/2020 0605   ALT 8 06/21/2020 1451   ALKPHOS 597 (H) 09/02/2020 0605   BILITOT 1.7 (H) 09/02/2020 0605   BILITOT 0.3 06/21/2020 1451   GFRNONAA >60 09/02/2020 0605   GFRNONAA 49 (L) 06/21/2020 1451   GFRAA 57 (L) 06/21/2020 1451   Lipase     Component Value Date/Time   LIPASE 38 03/10/2019 2058       Studies/Results: No results found.  Anti-infectives: Anti-infectives (From admission, onward)   None       Assessment/Plan Metastatic colon cancer s/p colostomy  HTN A. Fib - not currently on meds for this T2DM  ?CBD obstruction secondary to metastatic disease- on noncontrast MRI 08/26/20, GI following, Alk Phos has been stable/elevated, TBili up today 1.7 Acute vs Chronic cholecystitis  - Patient is clinically improving, nontender on abdominal exam and tolerating diet today without abdominal pain. WBC is WNL and patient is afebrile. Her LFTs did up trend today, but in the setting of clinical improvement would continue monitoring. Repeat CMP in AM.  FEN: soft diet, try Boost Breeze VTE: SQ heparin ID:  no current abx   LOS: 3 days    Wellington Hampshire, [facility] 09/02/2020, 10:25 AM Please see Amion for pager number during day hours 7:00am-4:30pm

## 2020-09-03 LAB — COMPREHENSIVE METABOLIC PANEL
ALT: 61 U/L — ABNORMAL HIGH (ref 0–44)
AST: 43 U/L — ABNORMAL HIGH (ref 15–41)
Albumin: 2.4 g/dL — ABNORMAL LOW (ref 3.5–5.0)
Alkaline Phosphatase: 494 U/L — ABNORMAL HIGH (ref 38–126)
Anion gap: 9 (ref 5–15)
BUN: 16 mg/dL (ref 8–23)
CO2: 27 mmol/L (ref 22–32)
Calcium: 8.3 mg/dL — ABNORMAL LOW (ref 8.9–10.3)
Chloride: 105 mmol/L (ref 98–111)
Creatinine, Ser: 0.88 mg/dL (ref 0.44–1.00)
GFR, Estimated: >60 mL/min (ref >60)
Glucose, Bld: 101 mg/dL — ABNORMAL HIGH (ref 70–99)
Potassium: 3.5 mmol/L (ref 3.5–5.1)
Sodium: 141 mmol/L (ref 135–145)
Total Bilirubin: 1.1 mg/dL (ref 0.3–1.2)
Total Protein: 5.3 g/dL — ABNORMAL LOW (ref 6.5–8.1)

## 2020-09-03 LAB — GLUCOSE, CAPILLARY
Glucose-Capillary: 95 mg/dL (ref 70–99)
Glucose-Capillary: 185 mg/dL — ABNORMAL HIGH (ref 70–99)

## 2020-09-03 MED ORDER — OXYCODONE HCL 5 MG PO TABS: 5.0000 mg | ORAL_TABLET | ORAL | 0 refills | Status: AC | PRN

## 2020-09-03 MED ORDER — FERROUS SULFATE 325 (65 FE) MG PO TABS: 325.0000 mg | ORAL_TABLET | Freq: Every day | ORAL | 1 refills | Status: AC

## 2020-09-03 MED ORDER — HEPARIN SOD (PORK) LOCK FLUSH 100 UNIT/ML IV SOLN: 500.0000 [IU] | INTRAVENOUS | Status: DC | PRN

## 2020-09-03 MED ORDER — METHOCARBAMOL 500 MG PO TABS: 500.0000 mg | ORAL_TABLET | Freq: Four times a day (QID) | ORAL | 0 refills | Status: AC | PRN

## 2020-09-03 MED ORDER — PANTOPRAZOLE SODIUM 40 MG PO TBEC
40.0000 mg | DELAYED_RELEASE_TABLET | Freq: Every day | ORAL | Status: DC
  Administered 2020-09-03: 40 mg via ORAL
  Filled 2020-09-03: qty 1

## 2020-09-03 MED ORDER — FENTANYL 25 MCG/HR TD PT72
1.0000 | MEDICATED_PATCH | TRANSDERMAL | 0 refills | Status: AC
Start: 2020-09-04 — End: ?

## 2020-09-03 MED ORDER — LIDOCAINE 5 % EX PTCH: 1.0000 | MEDICATED_PATCH | CUTANEOUS | 0 refills | Status: AC

## 2020-09-03 MED ORDER — PANTOPRAZOLE SODIUM 40 MG PO TBEC: 40.0000 mg | DELAYED_RELEASE_TABLET | Freq: Every day | ORAL | 1 refills | Status: AC

## 2020-09-03 MED ORDER — URSODIOL 300 MG PO CAPS: 300.0000 mg | ORAL_CAPSULE | Freq: Two times a day (BID) | ORAL | 1 refills | Status: AC

## 2020-09-03 NOTE — Progress Notes (Signed)
Physical Therapy Treatment Patient Details Name: Cassandra Allen MRN: [id number] DOB: 12/28/1945 Today's Date: 09/03/2020    History of Present Illness 50 female with history of metastatic colon cancer with obstruction status post left sigmoid colectomy and colostomy on 8/82/8003, diastolic congestive heart failure, hypertension, hyperlipidemia, diabetes mellitus type II, permanent A. fib  who presented as a transfer from [facility] for evaluation of abdominal pain/surgical management.  She was found to have elevated alkaline phosphatase and underwent MRCP which showed obstruction of the common bile duct due to central hepatic metastasis with intrahepatic biliary dilation and cystic duct distention    PT Comments    Pt participate fairly well. Required some encouragement to mobilize. Continue to recommend HHPT f/u. Pt stated she may d/c home later today.   Follow Up Recommendations  Home health PT;Supervision for mobility/OOB     Equipment Recommendations  None recommended by PT    Recommendations for Other Services       Precautions / Restrictions Precautions Precautions: Fall Restrictions Weight Bearing Restrictions: No    Mobility  Bed Mobility Overal bed mobility: Needs Assistance Bed Mobility: Supine to Sit     Supine to sit: Min assist;HOB elevated     General bed mobility comments: Assist for trunk. Increased time. Cues for safety, technique, and for pt to do as much as she could herself  Transfers Overall transfer level: Needs assistance Equipment used: Rolling walker (2 wheeled) Transfers: Sit to/from Stand Sit to Stand: Min assist         General transfer comment: Assist to rise, steady. Cues for hand placement.  Ambulation/Gait Ambulation/Gait assistance: Min assist;Min guard Gait Distance (Feet): 32 Feet Assistive device: Rolling walker (2 wheeled) Gait Pattern/deviations: Step-through pattern;Decreased stride length      General Gait Details: Mildly unsteady intermittently. Slow gait speed.   Stairs             Wheelchair Mobility    Modified Rankin (Stroke Patients Only)       Balance Overall balance assessment: Needs assistance         Standing balance support: Bilateral upper extremity supported Standing balance-Leahy Scale: Poor                              Cognition Arousal/Alertness: Awake/alert Behavior During Therapy: WFL for tasks assessed/performed Overall Cognitive Status: Within Functional Limits for tasks assessed                                        Exercises      General Comments        Pertinent Vitals/Pain Pain Assessment: No/denies pain    Home Living                      Prior Function            PT Goals (current goals can now be found in the care plan section) Progress towards PT goals: Progressing toward goals    Frequency    Min 3X/week      PT Plan Current plan remains appropriate    Co-evaluation              AM-PAC PT "6 Clicks" Mobility   Outcome Measure  Help needed turning from your back to your side while in a flat bed without using  bedrails?: A Little Help needed moving from lying on your back to sitting on the side of a flat bed without using bedrails?: A Little Help needed moving to and from a bed to a chair (including a wheelchair)?: A Little Help needed standing up from a chair using your arms (e.g., wheelchair or bedside chair)?: A Little Help needed to walk in hospital room?: A Little Help needed climbing 3-5 steps with a railing? : A Little 6 Click Score: 18    End of Session Equipment Utilized During Treatment: Gait belt Activity Tolerance: Patient tolerated treatment well Patient left: in chair;with call bell/phone within reach;with chair alarm set   PT Visit Diagnosis: Difficulty in walking, not elsewhere classified (R26.2)     Time: 9311-2162 PT Time  Calculation (min) (ACUTE ONLY): 13 min  Charges:  $Gait Training: 8-22 mins                        Doreatha Massed, PT Acute Rehabilitation  Office: [phone] Pager: [phone]

## 2020-09-03 NOTE — Progress Notes (Signed)
[facility] Progress Note     Subjective: CC-  Sitting up eating breakfast. No abdominal complaints currently. Denies pain, nausea, vomiting. Colostomy functioning. States that she did vomit once last night.  LFTs down trending.  Objective: Vital signs in last 24 hours: Temp:  [97.6 F (36.4 C)-98.7 F (37.1 C)] 97.6 F (36.4 C) (10/22 0553) Pulse Rate:  [54-70] 54 (10/22 0553) Resp:  [14-15] 14 (10/22 0553) BP: (134-148)/(59-105) 148/105 (10/22 0553) SpO2:  [92 %-100 %] 96 % (10/22 0553) Last BM Date: 09/02/20  Intake/Output from previous day: 10/21 0701 - 10/22 0700 In: 440 [P.O.:440] Out: 400 [Urine:400] Intake/Output this shift: No intake/output data recorded.  PE: General:Alert, NAD, chronically ill appearing Lungs:rate and effort normal on room air Abd: soft, NT, ND, +BS,colostomy in RLQ with soft stool present   Lab Results:  Recent Labs    09/02/20 0605  WBC 6.4  HGB 8.9*  HCT 28.9*  PLT 167   BMET Recent Labs    09/02/20 0605 09/03/20 0506  NA 138 141  K 3.8 3.5  CL 104 105  CO2 26 27  GLUCOSE 102* 101*  BUN 14 16  CREATININE 0.73 0.88  CALCIUM 8.3* 8.3*   PT/INR No results for input(s): LABPROT, INR in the last 72 hours. CMP     Component Value Date/Time   NA 141 09/03/2020 0506   NA 143 02/14/2019 0000   K 3.5 09/03/2020 0506   CL 105 09/03/2020 0506   CL 101 02/14/2019 0000   CO2 27 09/03/2020 0506   CO2 23 02/14/2019 0000   GLUCOSE 101 (H) 09/03/2020 0506   BUN 16 09/03/2020 0506   BUN 25 (A) 02/14/2019 0000   CREATININE 0.88 09/03/2020 0506   CREATININE 1.11 (H) 06/21/2020 1451   CALCIUM 8.3 (L) 09/03/2020 0506   CALCIUM 8.7 02/14/2019 0000   PROT 5.3 (L) 09/03/2020 0506   PROT 5.3 (A) 01/23/2019 0000   PROT 5.3 (A) 01/23/2019 0000   ALBUMIN 2.4 (L) 09/03/2020 0506   ALBUMIN 2.4 01/23/2019 0000   ALBUMIN 2.4 01/23/2019 0000   AST 43 (H) 09/03/2020 0506   AST 11 (L) 06/21/2020 1451   ALT 61 (H) 09/03/2020  0506   ALT 8 06/21/2020 1451   ALKPHOS 494 (H) 09/03/2020 0506   BILITOT 1.1 09/03/2020 0506   BILITOT 0.3 06/21/2020 1451   GFRNONAA >60 09/03/2020 0506   GFRNONAA 49 (L) 06/21/2020 1451   GFRAA 57 (L) 06/21/2020 1451   Lipase     Component Value Date/Time   LIPASE 38 03/10/2019 2058       Studies/Results: No results found.  Anti-infectives: Anti-infectives (From admission, onward)   None       Assessment/Plan Metastatic colon cancer s/p colostomy  HTN A. Fib - not currently on meds for this T2DM  ?CBD obstruction secondary to metastatic disease- on noncontrast MRI 08/26/20, GI following, LFTs trending down Acute vs Chronic cholecystitis - Patient is clinically improving, nontender on abdominal exam and tolerating diet today without abdominal pain. Continue ursodiol. I think she is also have some reflux symptoms at night, will add protonix. I think she could go home today with ursodiol and PPI. Recommend low fat diet (info on AVS).  FEN: soft diet, try Boost Breeze VTE: SQ heparin ID: no current abx   LOS: 4 days    Wellington Hampshire, [facility] 09/03/2020, 9:40 AM Please see Amion for pager number during day hours 7:00am-4:30pm

## 2020-09-03 NOTE — Discharge Summary (Signed)
Physician Discharge Summary  Hallee Mckenny [facility] VZC:[id number] DOB: 04/28/1946 DOA: 08/27/2020  PCP: [name], MD  Admit date: 08/27/2020 Discharge date: 09/03/2020  Admitted From: Home Disposition:  Home  Discharge Condition:Stable CODE STATUS:FULL Diet recommendation: Low fat diet  Brief/Interim Summary:  Patient is a 74 female with history of metastatic colon cancer with obstruction status post left sigmoid colectomy and colostomy on 12/10/7865, diastolic congestive heart failure, hypertension, hyperlipidemia, diabetes mellitus type II, permanent A. fib  who presented as a transfer from [facility] for evaluation of abdominal pain/surgical management.  She was found to have elevated alkaline phosphatase and underwent MRCP which showed obstruction of the common bile duct due to central hepatic metastasis with intrahepatic biliary dilation and cystic duct distention.  HIDA scan was abnormal and showed chronic cholecystitis.  GI, oncology, general surgery following, the plan is for conservative management.  Hospital course remarkable for persistent pain requiring IV pain medications and prolonged hospital stay.  Her pain is tolerable and she is tolerating diet at present.  She is medically stable for discharge to home today.  Following problems were addressed during her hospitalization:  Biliary obstruction secondary to hepatic mets on background of known metastatic colon cancer: History of metastatic colon cancer with obstruction status post left sigmoid colectomy and colostomy.  Follows with Dr. Burr Medico. MRCP done in [facility] showed obstruction of the common bile duct due to central hepatic metastasis with intrahepatic dilatation and cystic duct distention.  Imagings also showed a splenic lesions, left adrenal mass concerning for metastatic disease.  HIDA scan was abnormal and showed chronic cholecystitis.  There was no  bladder wall thickening as per imagings.   We are monitoring her  off antibiotics.  She is afebrile and she doesnot not have leukocytosis. General surgery, oncology and GI were closely following, now all signed off. Since bilirubin remains stable, ERCP with stent placement or percutaneous biliary drain placement is not indicated as per GI.  Continue to monitor liver function.  General surgery not planning for cholecystectomy either.  Started on ursodiol Oncology recommended to follow-up as an outpatient.  Abdominal pain/possible chronic cholecystitis: Presented with this complaint.  Colostomy is functioning.  Antibiotics not restarted.    Right upper quadrant was also tender.  .  No plan for further intervention a per surgery.  Pain management is the goal.  Started on fentanyl patch.  Continue as needed oxycodone.   Permanent A. fib: Currently rate is controlled.  Currently not on anticoagulation.  On rate control with Lopressor.  Hypertension: Currently Stable.  Currently on Lopressor and amlodipine  Hyperlipidemia: On statin  Diabetes type 2: Continue home regimen  Iron deficiency anemia: She had received iron infusion in the past.  Continue oral supplementation.  Severe hypokalemia: Supplemented and corrected.  Generalized weakness: PT recommended home health at discharge.     Discharge Diagnoses:  Principal Problem:   Biliary obstruction Active Problems:   Essential (primary) hypertension   HLD (hyperlipidemia)   Atrial fibrillation, chronic (HCC)   Cancer of sigmoid colon     Discharge Instructions  Discharge Instructions    Diet - low sodium heart healthy   Complete by: As directed    Low fat diet   Discharge instructions   Complete by: As directed    1) Please follow up with your PCP in a week. Please follow up with your oncologist in 1-2 weeks. Do a liver function test in 4 weeks 2)Take prescribed medications as instructed 3) Follow up  with home health 4)Take low fat diet   Increase activity  slowly   Complete by: As directed      Allergies as of 09/03/2020      Reactions   Metformin And Related Nausea And Vomiting   Prednisone Nausea And Vomiting      Medication List    TAKE these medications   amLODipine 5 MG tablet Commonly known as: NORVASC Take 5 mg by mouth daily.   fentaNYL 25 MCG/HR Commonly known as: [place] 1 patch onto the skin every 3 (three) days. Start taking on: September 04, 2020   ferrous sulfate 325 (65 FE) MG tablet Commonly known as: KP Ferrous Sulfate Take 1 tablet (325 mg total) by mouth daily with breakfast.   hydrocortisone 1 % lotion Apply 1 application topically 2 (two) times daily.   insulin detemir 100 UNIT/ML FlexPen Commonly known as: LEVEMIR Inject 16 Units into the skin daily.   lidocaine 5 % Commonly known as: LIDODERM Place 1 patch onto the skin daily. Remove & Discard patch within 12 hours or as directed by MD   liraglutide 18 MG/3ML Sopn Commonly known as: VICTOZA Inject 0.3 mLs (1.8 mg total) into the skin every morning.   methocarbamol 500 MG tablet Commonly known as: ROBAXIN Take 1 tablet (500 mg total) by mouth every 6 (six) hours as needed for muscle spasms.   metoprolol tartrate 25 MG tablet Commonly known as: LOPRESSOR Take 12.5 mg by mouth 2 (two) times daily.   OVER THE COUNTER MEDICATION Take 1 tablet by mouth daily. Healthy eye suppliment   oxybutynin 10 MG 24 hr tablet Commonly known as: DITROPAN-XL Take 1 tablet (10 mg total) by mouth daily. What changed: how much to take   oxyCODONE 5 MG immediate release tablet Commonly known as: Oxy IR/ROXICODONE Take 1 tablet (5 mg total) by mouth every 4 (four) hours as needed for moderate pain or severe pain.   pantoprazole 40 MG tablet Commonly known as: Protonix Take 1 tablet (40 mg total) by mouth daily.   QUEtiapine 25 MG tablet Commonly known as: SEROQUEL Take 12.5 mg by mouth at bedtime.   sertraline 100 MG tablet Commonly known as:  ZOLOFT Take 100 mg by mouth in the morning and at bedtime. What changed: Another medication with the same name was removed. Continue taking this medication, and follow the directions you see here.   simvastatin 10 MG tablet Commonly known as: ZOCOR Take 1 tablet (10 mg total) by mouth daily at 6 PM.   ursodiol 300 MG capsule Commonly known as: ACTIGALL Take 1 capsule (300 mg total) by mouth 2 (two) times daily.   vitamin C with rose hips 500 MG tablet Take 500 mg by mouth daily.       Follow-up Information    [name], MD. Schedule an appointment as soon as possible for a visit in 1 week(s).   Specialty: Family Medicine Contact information: [address] [place] [address] [phone]        Truitt Merle, MD. Schedule an appointment as soon as possible for a visit in 1 week(s).   Specialties: Hematology, Oncology Contact information: [address] [place] [address] [phone]              Allergies  Allergen Reactions  . Metformin And Related Nausea And Vomiting  . Prednisone Nausea And Vomiting    Consultations:  GI, oncology, general surgery   Procedures/Studies: MR Abdomen W Wo Contrast  Result Date: 08/06/2020 CLINICAL DATA:  74 year old female with history of colon cancer at the splenic flexure. Evaluate for liver metastases. EXAM: MRI ABDOMEN WITHOUT AND WITH CONTRAST TECHNIQUE: Multiplanar multisequence MR imaging of the abdomen was performed both before and after the administration of intravenous contrast. CONTRAST:  62mL GADAVIST GADOBUTROL 1 MMOL/ML IV SOLN COMPARISON:  Abdominal MRI 01/20/2020. FINDINGS: Lower chest: Unremarkable. Hepatobiliary: Again noted is a lesion which appears centered in the caudate lobe of the liver (axial image 19 of series 25 and coronal image 19 of series 4) measuring 5.1 x 2.6 x 3.6 cm. This lesion is heterogeneous in signal intensity, but predominantly T1 hypointense and T2  hyperintense, with some peripheral enhancement but is generally hypovascular internally. No other new suspicious hepatic lesions are noted. Subcentimeter T1 hypointense, T2 hyperintense, nonenhancing lesion in segment 2 of the liver is compatible with a tiny cyst or biliary hamartoma. No intra or extrahepatic biliary ductal dilatation. Tiny well-defined filling defects and some layering amorphous material noted in the dependent portion of the gallbladder, compatible with a combination of biliary sludge and tiny gallstones. Gallbladder is moderately distended without wall thickening. Pancreas: No pancreatic mass. No pancreatic ductal dilatation. No pancreatic or peripancreatic fluid collections or inflammatory changes. Spleen: Multiple nonspecific T2 hyperintense lesions scattered throughout the spleen, stable compared to numerous prior examinations, indeterminate but presumably benign. Adrenals/Urinary Tract: 3.0 x 2.6 x 3.2 cm left adrenal mass (axial image 52 of series 22 and coronal image 19 of series 24) demonstrates loss of signal intensity on out of phase dual echo images, indicative of an adrenal adenoma. Right adrenal gland is normal in appearance. Subcentimeter T1 hypointense, T2 hyperintense, nonenhancing lesion in the interpolar region of the right kidney, compatible with a small simple cyst. Left kidney is normal in appearance. No hydroureteronephrosis in the visualized portions of the abdomen. Stomach/Bowel: A few scattered colonic diverticulae are incidentally noted. Right-sided colostomy. Vascular/Lymphatic: No aneurysm identified in the visualized abdominal vasculature. No lymphadenopathy noted in the abdomen. Other: Small volume of ascites, most evident adjacent to the inferior aspect of the right lobe of the liver. Musculoskeletal: No aggressive appearing osseous lesions are noted in the visualized portions of the skeleton. IMPRESSION: 1. Treated metastatic lesions centered in the caudate lobe of  the liver appears very similar to the prior study from 01/20/2020, as detailed above. No new hepatic lesions or other signs of metastatic disease are noted elsewhere in the abdomen. 2. Biliary sludge and cholelithiasis without evidence of acute cholecystitis. 3. Small volume of ascites. 4. Colonic diverticulosis. 5. Additional incidental findings, as above. Electronically Signed   By: Vinnie Langton M.D.   On: 08/06/2020 10:59       Subjective: Patient seen and examined at the bedside this morning.  Medically stable for discharge.  Discharge Exam: Vitals:   09/02/20 2015 09/03/20 0553  BP: (!) 134/59 (!) 148/105  Pulse: 69 (!) 54  Resp: 14 14  Temp: 98.4 F (36.9 C) 97.6 F (36.4 C)  SpO2: 100% 96%   Vitals:   09/02/20 1127 09/02/20 1445 09/02/20 2015 09/03/20 0553  BP: (!) 143/61 135/62 (!) 134/59 (!) 148/105  Pulse:  (!) 59 69 (!) 54  Resp:  15 14 14   Temp:  98.7 F (37.1 C) 98.4 F (36.9 C) 97.6 F (36.4 C)  TempSrc:  Oral Oral Oral  SpO2:  92% 100% 96%  Weight:      Height:        General: Pt is alert, awake,  not in acute distress Cardiovascular: RRR, S1/S2 +, no rubs, no gallops Respiratory: CTA bilaterally, no wheezing, no rhonchi Abdominal: Soft, NT, ND, bowel sounds + Extremities: no edema, no cyanosis    The results of significant diagnostics from this hospitalization (including imaging, microbiology, ancillary and laboratory) are listed below for reference.     Microbiology: No results found for this or any previous visit (from the past 240 hour(s)).   Labs: BNP (last 3 results) No results for input(s): BNP in the last 8760 hours. Basic Metabolic Panel: Recent Labs  Lab 08/29/20 1011 08/29/20 1933 08/31/20 0603 09/02/20 0605 09/03/20 0506  NA 138 139 141 138 141  K 2.8* 3.8 3.9 3.8 3.5  CL 112* 105 111 104 105  CO2 17* 23 25 26 27   GLUCOSE 109* 152* 82 102* 101*  BUN 13 21 19 14 16   CREATININE 0.57 0.93 0.74 0.73 0.88  CALCIUM 6.0* 8.2*  7.8* 8.3* 8.3*   Liver Function Tests: Recent Labs  Lab 08/28/20 1156 08/29/20 1011 08/31/20 0603 09/02/20 0605 09/03/20 0506  AST 23 20 29  96* 43*  ALT 31 23 28  87* 61*  ALKPHOS 594* 535* 597* 597* 494*  BILITOT 0.9 0.8 0.7 1.7* 1.1  PROT 5.2* 4.1* 4.8* 5.3* 5.3*  ALBUMIN 2.4* 1.9* 2.3* 2.4* 2.4*   No results for input(s): LIPASE, AMYLASE in the last 168 hours. No results for input(s): AMMONIA in the last 168 hours. CBC: Recent Labs  Lab 08/27/20 1847 08/28/20 1156 08/29/20 0500 09/02/20 0605  WBC 6.2 5.5 5.7 6.4  NEUTROABS  --   --  4.5 5.4  HGB 9.5* 9.9* 10.0* 8.9*  HCT 31.1* 32.2* 32.1* 28.9*  MCV 95.4 95.8 95.3 95.7  PLT 166 178 173 167   Cardiac Enzymes: No results for input(s): CKTOTAL, CKMB, CKMBINDEX, TROPONINI in the last 168 hours. BNP: Invalid input(s): POCBNP CBG: Recent Labs  Lab 09/02/20 0800 09/02/20 1204 09/02/20 1722 09/02/20 2057 09/03/20 0727  GLUCAP 88 130* 199* 232* 95   D-Dimer No results for input(s): DDIMER in the last 72 hours. Hgb A1c No results for input(s): HGBA1C in the last 72 hours. Lipid Profile No results for input(s): CHOL, HDL, LDLCALC, TRIG, CHOLHDL, LDLDIRECT in the last 72 hours. Thyroid function studies No results for input(s): TSH, T4TOTAL, T3FREE, THYROIDAB in the last 72 hours.  Invalid input(s): FREET3 Anemia work up No results for input(s): VITAMINB12, FOLATE, FERRITIN, TIBC, IRON, RETICCTPCT in the last 72 hours. Urinalysis    Component Value Date/Time   COLORURINE AMBER (A) 03/10/2019 0041   APPEARANCEUR CLEAR 03/10/2019 0041   LABSPEC 1.032 (H) 03/10/2019 0041   PHURINE 5.0 03/10/2019 0041   GLUCOSEU NEGATIVE 03/10/2019 0041   HGBUR SMALL (A) 03/10/2019 0041   BILIRUBINUR NEGATIVE 03/10/2019 0041   KETONESUR NEGATIVE 03/10/2019 0041   PROTEINUR 30 (A) 03/10/2019 0041   UROBILINOGEN 0.2 03/25/2015 2317   NITRITE POSITIVE (A) 03/10/2019 0041   LEUKOCYTESUR MODERATE (A) 03/10/2019 0041   Sepsis  Labs Invalid input(s): PROCALCITONIN,  WBC,  LACTICIDVEN Microbiology No results found for this or any previous visit (from the past 240 hour(s)).  Please note: You were cared for by a hospitalist during your hospital stay. Once you are discharged, your primary care physician will handle any further medical issues. Please note that NO REFILLS for any discharge medications will be authorized once you are discharged, as it is imperative that you return to your primary care physician (or establish a relationship with a primary care physician  if you do not have one) for your post hospital discharge needs so that they can reassess your need for medications and monitor your lab values.    Time coordinating discharge: 40 minutes  SIGNED:   Shelly Coss, MD  [facility] 09/03/2020, 10:43 AM Pager [phone]  If 7PM-7AM, please contact night-coverage [email or web address] Password [email or web address]

## 2020-09-03 NOTE — TOC Transition Note (Signed)
Transition of Care [facility]) - CM/SW Discharge Note   Patient Details  Name: JEANE CASHATT MRN: [id number] Date of Birth: [birth year]  [facility] CM/SW Contact:  Lynnell Catalan, RN Phone Number: 09/03/2020, 12:48 PM   Clinical Narrative:    Pt to dc home today. Per RN, pt son is going to pick her up about 2pm. [name] at [facility] was alerted of DC. Both DC summary and AVS faxed to [facility]. [name] to contact home health company to continue HHPT with pt at home.      Barriers to Discharge: Continued Medical Work up  Discharge Plan and Services   Discharge Planning Services: CM Consult             Social Determinants of Health (SDOH) Interventions     Readmission Risk Interventions Readmission Risk Prevention Plan 03/13/2019  Transportation Screening Complete  Medication Review ([place]) Complete  PCP or Specialist appointment within 3-5 days of discharge Complete  HRI or [place] Not Complete  HRI or Home Care Consult Pt Refusal Comments Plan for discharge to SNF.  SW Recovery Care/Counseling Consult Complete  Palliative Care Screening Not Applicable  Skilled Nursing Facility Complete  Some recent data might be hidden

## 2020-09-03 NOTE — Plan of Care (Signed)
Patient is ready for discharge. Transportation has been arranged and is outside the main hospital entrance. AVS has been discussed in great detail with the patient and son all questions answered to include medication questions. Patient provided with education on new meds and advised on what to expect.  All IV's removed personal belongings returned and patient transported by Probation officer to her Son's white jeep [employer] [place]rokee. He is transporting her home. Telemetry Box removed, cleaned, and turned in. Patient Discharged.   Problem: Education: Goal: Knowledge of General Education information will improve Description: Including pain rating scale, medication(s)/side effects and non-pharmacologic comfort measures Outcome: Adequate for Discharge   Problem: Health Behavior/Discharge Planning: Goal: Ability to manage health-related needs will improve Outcome: Adequate for Discharge   Problem: Clinical Measurements: Goal: Ability to maintain clinical measurements within normal limits will improve Outcome: Adequate for Discharge Goal: Will remain free from infection Outcome: Adequate for Discharge Goal: Diagnostic test results will improve Outcome: Adequate for Discharge Goal: Cardiovascular complication will be avoided Outcome: Adequate for Discharge   Problem: Activity: Goal: Risk for activity intolerance will decrease Outcome: Adequate for Discharge   Problem: Nutrition: Goal: Adequate nutrition will be maintained Outcome: Adequate for Discharge   Problem: Coping: Goal: Level of anxiety will decrease Outcome: Adequate for Discharge   Problem: Elimination: Goal: Will not experience complications related to bowel motility Outcome: Adequate for Discharge Goal: Will not experience complications related to urinary retention Outcome: Adequate for Discharge   Problem: Pain Managment: Goal: General experience of comfort will improve Outcome: Adequate for Discharge   Problem: Safety: Goal:  Ability to remain free from injury will improve Outcome: Adequate for Discharge   Problem: Skin Integrity: Goal: Risk for impaired skin integrity will decrease Outcome: Adequate for Discharge

## 2020-09-23 ENCOUNTER — Inpatient Hospital Stay: Payer: Medicare (Managed Care)

## 2020-09-25 ENCOUNTER — Encounter (HOSPITAL_COMMUNITY): Payer: Self-pay | Admitting: Internal Medicine

## 2020-09-25 ENCOUNTER — Inpatient Hospital Stay (HOSPITAL_COMMUNITY)
Admission: AD | Admit: 2020-09-25 | Discharge: 2020-10-02 | DRG: 435 | Disposition: A | Discharge status: Home Health | Payer: Medicare (Managed Care) | Source: Other Acute Inpatient Hospital | Attending: Internal Medicine | Admitting: Internal Medicine

## 2020-09-25 DIAGNOSIS — Z20822 Contact with and (suspected) exposure to covid-19: Secondary | ICD-10-CM | POA: Diagnosis present

## 2020-09-25 DIAGNOSIS — K8021 Calculus of gallbladder without cholecystitis with obstruction: Secondary | ICD-10-CM | POA: Diagnosis present

## 2020-09-25 DIAGNOSIS — I482 Chronic atrial fibrillation, unspecified: Secondary | ICD-10-CM | POA: Diagnosis present

## 2020-09-25 DIAGNOSIS — J9811 Atelectasis: Secondary | ICD-10-CM | POA: Diagnosis present

## 2020-09-25 DIAGNOSIS — R188 Other ascites: Secondary | ICD-10-CM | POA: Diagnosis present

## 2020-09-25 DIAGNOSIS — N179 Acute kidney failure, unspecified: Secondary | ICD-10-CM | POA: Diagnosis present

## 2020-09-25 DIAGNOSIS — C7972 Secondary malignant neoplasm of left adrenal gland: Secondary | ICD-10-CM | POA: Diagnosis present

## 2020-09-25 DIAGNOSIS — R627 Adult failure to thrive: Secondary | ICD-10-CM | POA: Diagnosis present

## 2020-09-25 DIAGNOSIS — Z7189 Other specified counseling: Secondary | ICD-10-CM | POA: Diagnosis not present

## 2020-09-25 DIAGNOSIS — D649 Anemia, unspecified: Secondary | ICD-10-CM | POA: Diagnosis present

## 2020-09-25 DIAGNOSIS — G9341 Metabolic encephalopathy: Secondary | ICD-10-CM | POA: Diagnosis present

## 2020-09-25 DIAGNOSIS — Z8249 Family history of ischemic heart disease and other diseases of the circulatory system: Secondary | ICD-10-CM

## 2020-09-25 DIAGNOSIS — Z933 Colostomy status: Secondary | ICD-10-CM

## 2020-09-25 DIAGNOSIS — E785 Hyperlipidemia, unspecified: Secondary | ICD-10-CM | POA: Diagnosis present

## 2020-09-25 DIAGNOSIS — E782 Mixed hyperlipidemia: Secondary | ICD-10-CM | POA: Diagnosis not present

## 2020-09-25 DIAGNOSIS — Z794 Long term (current) use of insulin: Secondary | ICD-10-CM

## 2020-09-25 DIAGNOSIS — J9601 Acute respiratory failure with hypoxia: Secondary | ICD-10-CM | POA: Diagnosis not present

## 2020-09-25 DIAGNOSIS — Z9221 Personal history of antineoplastic chemotherapy: Secondary | ICD-10-CM

## 2020-09-25 DIAGNOSIS — R0602 Shortness of breath: Secondary | ICD-10-CM

## 2020-09-25 DIAGNOSIS — I130 Hypertensive heart and chronic kidney disease with heart failure and stage 1 through stage 4 chronic kidney disease, or unspecified chronic kidney disease: Secondary | ICD-10-CM | POA: Diagnosis present

## 2020-09-25 DIAGNOSIS — R17 Unspecified jaundice: Secondary | ICD-10-CM | POA: Diagnosis present

## 2020-09-25 DIAGNOSIS — I5032 Chronic diastolic (congestive) heart failure: Secondary | ICD-10-CM | POA: Diagnosis present

## 2020-09-25 DIAGNOSIS — E1122 Type 2 diabetes mellitus with diabetic chronic kidney disease: Secondary | ICD-10-CM | POA: Diagnosis present

## 2020-09-25 DIAGNOSIS — E860 Dehydration: Secondary | ICD-10-CM | POA: Diagnosis present

## 2020-09-25 DIAGNOSIS — N1831 Chronic kidney disease, stage 3a: Secondary | ICD-10-CM | POA: Diagnosis present

## 2020-09-25 DIAGNOSIS — E876 Hypokalemia: Secondary | ICD-10-CM | POA: Diagnosis present

## 2020-09-25 DIAGNOSIS — Z515 Encounter for palliative care: Secondary | ICD-10-CM | POA: Diagnosis not present

## 2020-09-25 DIAGNOSIS — C185 Malignant neoplasm of splenic flexure: Secondary | ICD-10-CM | POA: Diagnosis present

## 2020-09-25 DIAGNOSIS — E1165 Type 2 diabetes mellitus with hyperglycemia: Secondary | ICD-10-CM | POA: Diagnosis present

## 2020-09-25 DIAGNOSIS — R7989 Other specified abnormal findings of blood chemistry: Secondary | ICD-10-CM

## 2020-09-25 DIAGNOSIS — C787 Secondary malignant neoplasm of liver and intrahepatic bile duct: Secondary | ICD-10-CM | POA: Diagnosis present

## 2020-09-25 DIAGNOSIS — E871 Hypo-osmolality and hyponatremia: Secondary | ICD-10-CM | POA: Diagnosis present

## 2020-09-25 DIAGNOSIS — E669 Obesity, unspecified: Secondary | ICD-10-CM | POA: Diagnosis present

## 2020-09-25 DIAGNOSIS — K831 Obstruction of bile duct: Secondary | ICD-10-CM | POA: Diagnosis not present

## 2020-09-25 DIAGNOSIS — Z9049 Acquired absence of other specified parts of digestive tract: Secondary | ICD-10-CM

## 2020-09-25 DIAGNOSIS — I10 Essential (primary) hypertension: Secondary | ICD-10-CM | POA: Diagnosis not present

## 2020-09-25 DIAGNOSIS — N183 Chronic kidney disease, stage 3 unspecified: Secondary | ICD-10-CM | POA: Diagnosis present

## 2020-09-25 DIAGNOSIS — Z79899 Other long term (current) drug therapy: Secondary | ICD-10-CM

## 2020-09-25 DIAGNOSIS — Z888 Allergy status to other drugs, medicaments and biological substances status: Secondary | ICD-10-CM

## 2020-09-25 DIAGNOSIS — Z6828 Body mass index (BMI) 28.0-28.9, adult: Secondary | ICD-10-CM

## 2020-09-25 MED ORDER — ONDANSETRON HCL 4 MG/2ML IJ SOLN: 4.0000 mg | Freq: Four times a day (QID) | INTRAMUSCULAR | Status: DC | PRN

## 2020-09-25 MED ORDER — AMLODIPINE BESYLATE 5 MG PO TABS: 5.0000 mg | ORAL_TABLET | Freq: Every day | ORAL | Status: DC

## 2020-09-25 MED ORDER — PIPERACILLIN-TAZOBACTAM 3.375 G IVPB
3.3750 g | Freq: Three times a day (TID) | INTRAVENOUS | Status: DC
  Administered 2020-09-26 (×3): 3.375 g via INTRAVENOUS
  Filled 2020-09-25 (×3): qty 50

## 2020-09-25 MED ORDER — INSULIN DETEMIR 100 UNIT/ML ~~LOC~~ SOLN
16.0000 [IU] | Freq: Every day | SUBCUTANEOUS | Status: DC
  Administered 2020-09-26 – 2020-10-02 (×6): 16 [IU] via SUBCUTANEOUS
  Filled 2020-09-25 (×7): qty 0.16

## 2020-09-25 MED ORDER — METOPROLOL TARTRATE 25 MG PO TABS
12.5000 mg | ORAL_TABLET | Freq: Two times a day (BID) | ORAL | Status: DC
  Administered 2020-09-27 – 2020-10-02 (×6): 12.5 mg via ORAL
  Filled 2020-09-25 (×9): qty 1

## 2020-09-25 MED ORDER — PANTOPRAZOLE SODIUM 40 MG PO TBEC
40.0000 mg | DELAYED_RELEASE_TABLET | Freq: Every day | ORAL | Status: DC
  Administered 2020-09-26 – 2020-10-02 (×6): 40 mg via ORAL
  Filled 2020-09-25 (×6): qty 1

## 2020-09-25 MED ORDER — SODIUM CHLORIDE 0.9 % IV SOLN: INTRAVENOUS | Status: AC

## 2020-09-25 MED ORDER — INSULIN ASPART 100 UNIT/ML ~~LOC~~ SOLN
0.0000 [IU] | Freq: Three times a day (TID) | SUBCUTANEOUS | Status: DC
  Administered 2020-09-30 – 2020-10-01 (×3): 2 [IU] via SUBCUTANEOUS
  Administered 2020-10-01: 1 [IU] via SUBCUTANEOUS
  Administered 2020-10-01: 2 [IU] via SUBCUTANEOUS
  Administered 2020-10-02: 3 [IU] via SUBCUTANEOUS

## 2020-09-25 MED ORDER — OXYBUTYNIN CHLORIDE ER 5 MG PO TB24
20.0000 mg | ORAL_TABLET | Freq: Every day | ORAL | Status: DC
  Administered 2020-09-26 – 2020-10-02 (×6): 20 mg via ORAL
  Filled 2020-09-25 (×6): qty 4

## 2020-09-25 MED ORDER — HEPARIN SODIUM (PORCINE) 5000 UNIT/ML IJ SOLN
5000.0000 [IU] | Freq: Three times a day (TID) | INTRAMUSCULAR | Status: DC
  Administered 2020-09-26 – 2020-10-02 (×15): 5000 [IU] via SUBCUTANEOUS
  Filled 2020-09-25 (×15): qty 1

## 2020-09-25 MED ORDER — ONDANSETRON HCL 4 MG PO TABS: 4.0000 mg | ORAL_TABLET | Freq: Four times a day (QID) | ORAL | Status: DC | PRN

## 2020-09-25 MED ORDER — URSODIOL 300 MG PO CAPS
300.0000 mg | ORAL_CAPSULE | Freq: Two times a day (BID) | ORAL | Status: DC
  Administered 2020-09-26 – 2020-10-02 (×12): 300 mg via ORAL
  Filled 2020-09-25 (×14): qty 1

## 2020-09-25 MED ORDER — ENOXAPARIN SODIUM 40 MG/0.4ML ~~LOC~~ SOLN: 40.0000 mg | SUBCUTANEOUS | Status: DC

## 2020-09-25 MED ORDER — SODIUM CHLORIDE 0.9% FLUSH
3.0000 mL | Freq: Two times a day (BID) | INTRAVENOUS | Status: DC
  Administered 2020-09-27 – 2020-10-01 (×7): 3 mL via INTRAVENOUS

## 2020-09-25 MED ORDER — OXYCODONE HCL 5 MG PO TABS: 5.0000 mg | ORAL_TABLET | ORAL | Status: DC | PRN

## 2020-09-25 NOTE — Plan of Care (Signed)
  Problem: Elimination: Goal: Will not experience complications related to bowel motility 09/25/2020 2344 by Mickie Kay, RN Outcome: Progressing 09/25/2020 2344 by Mickie Kay, RN Outcome: Progressing

## 2020-09-25 NOTE — H&P (Addendum)
[facility] History and Physical  CATHLENE GARDELLA EGB:[id number] DOB: 1946/05/06 DOA: 09/25/2020  Referring physician: ED  PCP: [name], MD   Patient is coming from: Home  Chief Complaint: Jaundice, abdominal pain  HPI: Cassandra Allen is a 74 y.o. 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   Patient described the pain as mild constant dull in nature but denied any obvious radiation, no obvious aggravating or relieving factor. She also had nausea and vomiting with increasing jaundice for the last few days with impaired appetite.  Of note, patient was recently admitted to [facility] hospital for biliary obstruction secondary to hepatic metastasis compressing on the biliary duct.  Patient was treated conservatively at that time by general surgery and GI and no intervention was done.  On presentation, to the ED on 09/25/2020 at [facility], her bilirubin level has gone up to 10.5 with direct bilirubin at 9.2.  A repeat CT scan was performed and showed worsening biliary dilatation and worsening metastasis. Additionally, creatinine of 2.72 from 0.8, alkaline phosphatase more than 1000 with WBC of 13.  CT scan of the abdomen showed worsening biliary dilatation and slight increase in the metastasis in the liver.  Patient was started on IV Zosyn.  Hematooncology Dr. Marin Olp was notified and the patient was considered for admission to the [facility].  Covid test done at [facility] was negative.  Review of Systems:  All systems were reviewed and were negative unless otherwise mentioned in the HPI  Past Medical  History:  Diagnosis Date  . Acute diastolic heart failure ([place]) 12/22/2018  . Arthritis   . Atrial fibrillation, chronic ([place]) 12/22/2018  . Cancer of left colon ([place]) 10/30/2018  . Cancer of sigmoid colon  12/27/2018  . Diabetes mellitus without complication ([place])   . Hypertension   . Obesity (BMI 30-39.9) 12/27/2018   Past Surgical History:  Procedure Laterality Date  . COLONOSCOPY  10/2018   Dr [name].  Large cancer at splenic flexure,  Bulky sigmoid colon mass, Numerous polyps  . Intra-abdominal abscess drainage  01/31/2019   Abscess drainage grew Enterococcus faecalis  . IR CATHETER TUBE CHANGE  06/05/2019  . IR IMAGING GUIDED PORT INSERTION  11/20/2018  . IR RADIOLOGIST EVAL & MGMT  06/04/2019  . IR RADIOLOGIST EVAL & MGMT  06/17/2019  . IR RADIOLOGIST EVAL & MGMT  09/16/2019  . LAPAROTOMY N/A 12/27/2018   Procedure: LEFT SIGMOID COLECTOMY WITH HARTMANN POUCH AND END COLOSTOMY;  Surgeon: [name], MD;  Location: [facility] ORS;  Service: General;  Laterality: N/A;    Social History:  reports that she has never smoked. She has never used smokeless tobacco. She reports that she does not drink alcohol and does not use drugs.  Allergies  Allergen Reactions  . Metformin And Related Nausea And Vomiting  . Prednisone Nausea And Vomiting    Family History  Problem Relation Age of Onset  . Heart attack Mother   . Heart attack Father      Prior to Admission medications   Medication Sig Start Date End Date Taking? Authorizing Provider  amLODipine (NORVASC) 5 MG tablet Take 5 mg by mouth daily.  08/21/19   [provider]  Ascorbic Acid (VITAMIN C WITH ROSE HIPS) 500 MG tablet Take 500 mg by mouth daily.    [provider]  fentaNYL (DURAGESIC) 25 MCG/HR Place 1 patch onto the skin every 3 (three) days. 09/04/20   Shelly Coss, MD  ferrous sulfate (KP FERROUS SULFATE) 325 (65 FE) MG tablet Take 1 tablet (325 mg total) by mouth daily with breakfast. 09/03/20   Shelly Coss, MD  hydrocortisone 1 % lotion Apply 1 application topically 2 (two) times daily. Patient not taking: Reported on 08/27/2020 06/21/20   Truitt Merle, MD  Insulin Detemir (LEVEMIR) 100 UNIT/ML Pen Inject 16 Units into the skin daily. 03/21/19   Hongalgi, Lenis Dickinson, MD  lidocaine (LIDODERM) 5 % Place 1 patch onto the skin daily. Remove & Discard patch within 12 hours or as directed by MD 09/03/20   Shelly Coss, MD  liraglutide (VICTOZA) 18 MG/3ML SOPN Inject 0.3 mLs (1.8 mg total) into the skin every morning. Patient not taking: Reported on 08/27/2020 03/20/19   Modena Jansky, MD  methocarbamol (ROBAXIN) 500 MG tablet Take 1 tablet (500 mg total) by mouth every 6 (six) hours as needed for muscle spasms. 09/03/20   Shelly Coss, MD  metoprolol tartrate (LOPRESSOR) 25 MG tablet Take 12.5 mg by mouth 2 (two) times daily.  12/02/19   [provider]  OVER THE COUNTER MEDICATION Take 1 tablet by mouth daily. Healthy eye suppliment    [provider]  oxybutynin (DITROPAN-XL) 10 MG 24 hr tablet Take 1 tablet (10 mg total) by mouth daily. Patient taking differently: Take 20 mg by mouth daily.  03/20/19   Hongalgi, Lenis Dickinson, MD  oxyCODONE (OXY IR/ROXICODONE) 5 MG immediate release tablet Take 1 tablet (5 mg total) by mouth every 4 (four) hours as needed for moderate pain or severe pain. 09/03/20   Shelly Coss, MD  pantoprazole (PROTONIX) 40 MG tablet Take 1 tablet (40 mg total) by mouth daily. 09/03/20 11/02/20  Shelly Coss, MD  QUEtiapine (SEROQUEL) 25 MG tablet Take 12.5 mg by mouth at bedtime.  02/04/20   [provider]  sertraline (ZOLOFT) 100 MG tablet Take 100 mg by mouth in the morning and at bedtime.    [provider]  simvastatin (ZOCOR) 10 MG tablet Take 1 tablet (10 mg total) by mouth daily at 6 PM. 03/20/19   Hongalgi, Lenis Dickinson, MD  ursodiol (ACTIGALL) 300 MG capsule Take 1 capsule (300 mg total) by mouth 2 (two) times daily. 09/03/20   Shelly Coss,  MD    Physical Exam: Vitals:   09/25/20 2100  BP: (!) 92/46  Pulse: 74  Resp: 16  Temp: 97.6 F (36.4 C)  TempSrc: Oral  SpO2: 96%  Weight: 66.6 kg   Wt Readings from Last 3 Encounters:  09/25/20 66.6 kg  08/27/20 68.8 kg  08/11/20 71.7 kg   Body mass index is 26.85 kg/m.  General: Frail-appearing, not in obvious distress, deeply jaundiced HENT: Normocephalic, pupils equally reacting to light and accommodation.  Icteric, oral mucosa is moist.  Chest:  Clear breath sounds.  Diminished breath sounds bilaterally. No crackles or wheezes.  CVS: S1 &S2 heard.  Systolic murmur noted regular rate and rhythm. Abdomen: Soft, nontender, nondistended.  Bowel sounds are heard.  Liver is not palpable, no abdominal mass palpated.  Colostomy bag in place, old abdominal scar. Extremities: No cyanosis, clubbing or edema.  Peripheral pulses are palpable. Psych: Alert, awake and oriented, normal mood CNS:  No cranial nerve deficits.  Power equal in all extremities.    Skin: Warm and dry.  Deeply icteric.  Labs on Admission:   CBC: No results for input(s): WBC, NEUTROABS, HGB, HCT, MCV, PLT in the last 168 hours.  Basic Metabolic Panel: No results for input(s): NA, K, CL, CO2, GLUCOSE, BUN, CREATININE, CALCIUM, MG, PHOS in the last 168 hours.  Liver Function Tests: No results for input(s): AST, ALT, ALKPHOS, BILITOT, PROT, ALBUMIN in the last 168 hours. No results for input(s): LIPASE, AMYLASE in the last 168 hours. No results for input(s): AMMONIA in the last 168 hours.  Cardiac Enzymes: No results for input(s): CKTOTAL, CKMB, CKMBINDEX, TROPONINI in the last 168 hours.  BNP (last 3 results) No results for input(s): BNP in the last 8760 hours.  ProBNP (last 3 results) No results for input(s): PROBNP in the last 8760 hours.  CBG: No results for input(s): GLUCAP in the last 168 hours.   Radiological Exams on Admission: No results found.  REVIEW OF REPORT FROM  OSH  Imaging: CT Abdomen Pelvis WO IV Contrast  Result Date: 09/25/2020 INDICATION: Generalized weakness. Jaundice. History of colon cancer. COMPARISON: MRI 08/26/2020. CT abdomen pelvis 08/25/2020. TECHNIQUE: CT ABDOMEN PELVIS WO IV CONTRAST - Contrast: . Dose reduction was utilized (automated exposure control, mA or kV adjustment based on patient size, or iterative image reconstruction). FINDINGS: # Lower Thorax: Lung bases are clear. # Liver: Slight interval enlargement of hypodense mass in caudate lobe. Perihepatic ascites. # Biliary tree: Extensive intrahepatic biliary dilatation. # Gallbladder: Markedly distended gallbladder with layering gallstones. Mild gallbladder wall thickening and pericholecystic fat stranding. # Pancreas: Normal. # Spleen: Normal. # Adrenal Glands: Left adrenal gland metastatic mass measuring 3.5 x 2.6 cm. Unremarkable right adrenal gland. # Kidneys: No renal stones or hydronephrosis bilaterally. # Stomach:Unremarkable. # Bowel: Right lower quadrant colostomy. No bowel obstruction. Diverticulosis coli. # Appendix: Normal appendix. # Peritoneal Cavity: No pneumoperitoneum. No lymphadenopathy. # Urinary Bladder: Decompressed and not evaluated. # Pelvis: Small free pelvic fluid. # Bones: No acute fractures or dislocations. No osseous destructive lesions. #   IMPRESSION: 1. Slight interval enlargement in metastatic hypodense mass in hepatic caudate lobe causing severe intrahepatic biliary dilatation. 2. Distended gallbladder with layering gallstones, mild gallbladder wall thickening and pericholecystic fat stranding raising suspicion for acute cholecystitis. Follow-up recommended. 3. Left adrenal gland metastatic mass/nodule has slightly increased in size. 4. Diverticulosis without diverticulitis. Electronically Signed by: Leslie Dales  CT Head WO IV Contrast  Result Date: 09/25/2020 INDICATION: Altered mental status COMPARISON: CT head without contrast 07/05/2019 TECHNIQUE:  Multiple axial CT images obtained from the skull base to vertex without IV contrast. FINDINGS: # No acute intracranial hemorrhage. # No masses, mass effect, midline shift or hydrocephalus. # Chronic small vessel ischemic disease. Generalized brain atrophy. # Calvarium is intact. # Visualized orbits and globes are unremarkable without radiopaque foreign bodies. # Visualized paranasal sinuses are clear. # Visualized mastoid air cells are clear.   IMPRESSION: 1. No acute intracranial hemorrhage. Calvarium is intact. Electronically Signed by: Leslie Dales  XR Chest Ap Portable  Result Date: 09/25/2020 INDICATION: Acute mental status change COMPARISON: 06/28/2019 TECHNIQUE: Frontal view of the chest was performed. FINDINGS: Right Mediport tip in SVC. LUNGS: No focal consolidation. No pleural effusion. PNEUMOTHORAX: None. HEART SIZE: Normal.   IMPRESSION: 1. No active disease.   EKG: ECG 12 lead  Result Date: 09/25/2020 Diagnosis Class Abnormal Acquisition Device L2G Systolic BP 401 Diastolic BP 54 Ventricular Rate 83 Atrial Rate 83 P-R Interval 178 QRS  Duration 90 Q-T Interval 382 QTC Calculation(Bazett) 448 Calculated P Axis 58 Calculated R Axis 25 Calculated T Axis 64 Diagnosis Normal sinus rhythm Possible Anterior infarct , age undetermined Abnormal ECG When compared with ECG of 26-Aug-2020 04:55, premature atrial complexes are no longer present Criteria for Inferior infarct are no longer present Nonspecific T wave abnormality now evident in Inferior leads QT has shortened  Review of outside labs from [place] from 09/25/20.  PT of 24.3 INR of 2.2, urinalysis was cloudy with 2+ bacteria and calcium oxalate crystals,, WBC of 13.1, hemoglobin 10.0, sodium of 131, bicarb of 19, BUN of 76, creatinine of 2.7 baseline less than 1, total bilirubin of 10.5, albumin of 3.0, AST 108 ALT 101 direct bilirubin 9.2.  Venous blood gas showed pH of 7.3 with PCO2 of 42.  Recent hepatitis panel was negative.  Flu in Covid  test was negative from today.  Fecal occult blood was positive.    Assessment/Plan Principal Problem:   Jaundice Active Problems:   Essential (primary) hypertension   Cancer of splenic flexure of colon   HLD (hyperlipidemia)   Atrial fibrillation, chronic (HCC)   CKD (chronic kidney disease) stage 3, GFR 30-59 ml/min (HCC)  Obstructive jaundice with right upper quadrant pain  History of liver metastasis.  Patient will need GI consultation in a.m.  Dr. Marin Olp was notified from the ED at [facility]  Patient had been seen by Dr. Watt Climes in the past.  Will need to call GI in a.m regarding further management plan.  Patient might need stent placement/ERCP  Acute kidney injury on chronic kidney disease with stage IIIa.  BUN of 76 creatinine 2.7 from today's lab at [facility].  Likely secondary to volume depletion from vomiting.  Baseline creatinine less than 1.  Continue normal saline at 100 mL/h.  Avoid nephrotoxic medications.  Monitor BMP in a.m.  Diabetes mellitus type 2.  Last A1c of 6.5 on 10/14.  Continue sliding scale insulin, Accu-Cheks, will be on clears tonight.  Chronic diastolic heart failure.  Currently appears volume depleted.  continue IV fluid hydration today.  Reassess in a.m.  Chronic atrial fibrillation.  On metoprolol  at home.  Currently not on anticoagulation.  Will need pharmacy med reconciliation.  Pending at this time.  Resumed metoprolol.  Elevated leukocyte count.  WBC of 13.1.  On IV Zosyn at [facility].  We will continue for now.  Reassess the need for further antibiotics in a.m.  Mild confusion, possibly metabolic encephalopathy.  Patient was disoriented to place.  Reassess in a.m. continue hydration.  Continue Zosyn for now. Afebrile. UA was cloudy with bacteria from OSH. Will get urine culture.   History of colon cancer status post liver and adrenal mets status post colectomy in colostomy.  Follows up with oncology as outpatient.  Please notify oncology for follow-up.  We  will put in the treatment team.  Mild hyponatremia.  Sodium of 130 from today's lab.  Continue IV fluid hydration.  Check BMP in a.m.  History of hypertension, hyperlipidemia.  Medication reconciliation pending.  Hold off with statins.  Resumed amlodipine.  We will closely monitor blood pressure.  DVT Prophylaxis: Heparin subcu  Consultant: Oncology  Code Status: Full code at this time. Spoke with the patient's son but he wasn't quite sure.  Please reconsider discussion in a.m.  Microbiology none  Antibiotics: IV Zosyn  Family Communication:  Patients' condition and plan of care including tests being ordered have been discussed with the patient's son who indicate  understanding and agree with the plan.  Status is: Inpatient  Remains inpatient appropriate because:IV treatments appropriate due to intensity of illness or inability to take PO and Inpatient level of care appropriate due to severity of illness   Dispo: The patient is from: Home              Anticipated d/c is to: Home              Anticipated d/c date is: 3 days              Patient currently is not medically stable to d/c.   Severity of Illness: The appropriate patient status for this patient is INPATIENT. Inpatient status is judged to be reasonable and necessary in order to provide the required intensity of service to ensure the patient's safety. The patient's presenting symptoms, physical exam findings, and initial radiographic and laboratory data in the context of their chronic comorbidities is felt to place them at high risk for further clinical deterioration. Furthermore, it is not anticipated that the patient will be medically stable for discharge from the hospital within 2 midnights of admission. The following factors support the patient status of inpatient. I certify that at the point of admission it is my clinical judgment that the patient will require inpatient hospital care spanning beyond 2 midnights from the  point of admission due to high intensity of service, high risk for further deterioration and high frequency of surveillance required.  Signed, Flora Lipps, MD [facility] 09/25/2020

## 2020-09-25 NOTE — Progress Notes (Addendum)
Pharmacy Antibiotic Note  Cassandra Allen is a 74 y.o. female admitted on 09/25/2020 with intra-abdominal infection.  Pharmacy has been consulted for Zosyn dosing.  CrCl>20 ~ 2 weeks ago  Plan: Zosyn 3.375gm IV Q8h to be infused over 4hrs F/U Scr  Weight: 66.6 kg (146 lb 13.2 oz)  Temp (24hrs), Avg:97.6 F (36.4 C), Min:97.6 F (36.4 C), Max:97.6 F (36.4 C)  No results for input(s): WBC, CREATININE, LATICACIDVEN, VANCOTROUGH, VANCOPEAK, VANCORANDOM, GENTTROUGH, GENTPEAK, GENTRANDOM, TOBRATROUGH, TOBRAPEAK, TOBRARND, AMIKACINPEAK, AMIKACINTROU, AMIKACIN in the last 168 hours.  CrCl cannot be calculated (Patient's most recent lab result is older than the maximum 21 days allowed.).    Allergies  Allergen Reactions  . Metformin And Related Nausea And Vomiting  . Prednisone Nausea And Vomiting    Thank you for allowing pharmacy to be a part of this patient's care.  Netta Cedars PharmD 09/25/2020 11:56 PM

## 2020-09-26 ENCOUNTER — Other Ambulatory Visit: Payer: Self-pay

## 2020-09-26 ENCOUNTER — Inpatient Hospital Stay (HOSPITAL_COMMUNITY): Payer: Medicare (Managed Care)

## 2020-09-26 DIAGNOSIS — R17 Unspecified jaundice: Secondary | ICD-10-CM | POA: Diagnosis not present

## 2020-09-26 LAB — CBC
HCT: 25.7 % — ABNORMAL LOW (ref 36.0–46.0)
Hemoglobin: 7.8 g/dL — ABNORMAL LOW (ref 12.0–15.0)
MCH: 28.9 pg (ref 26.0–34.0)
MCHC: 30.4 g/dL (ref 30.0–36.0)
MCV: 95.2 fL (ref 80.0–100.0)
Platelets: 178 10*3/uL (ref 150–400)
RBC: 2.7 MIL/uL — ABNORMAL LOW (ref 3.87–5.11)
RDW: 16.4 % — ABNORMAL HIGH (ref 11.5–15.5)
WBC: 7.3 10*3/uL (ref 4.0–10.5)
nRBC: 0 % (ref 0.0–0.2)

## 2020-09-26 LAB — COMPREHENSIVE METABOLIC PANEL
ALT: 69 U/L — ABNORMAL HIGH (ref 0–44)
AST: 73 U/L — ABNORMAL HIGH (ref 15–41)
Albumin: 2 g/dL — ABNORMAL LOW (ref 3.5–5.0)
Alkaline Phosphatase: 625 U/L — ABNORMAL HIGH (ref 38–126)
Anion gap: 12 (ref 5–15)
BUN: 72 mg/dL — ABNORMAL HIGH (ref 8–23)
CO2: 19 mmol/L — ABNORMAL LOW (ref 22–32)
Calcium: 8 mg/dL — ABNORMAL LOW (ref 8.9–10.3)
Chloride: 106 mmol/L (ref 98–111)
Creatinine, Ser: 2.71 mg/dL — ABNORMAL HIGH (ref 0.44–1.00)
GFR, Estimated: 18 mL/min — ABNORMAL LOW (ref >60)
Glucose, Bld: 108 mg/dL — ABNORMAL HIGH (ref 70–99)
Potassium: 3.2 mmol/L — ABNORMAL LOW (ref 3.5–5.1)
Sodium: 137 mmol/L (ref 135–145)
Total Bilirubin: 9.8 mg/dL — ABNORMAL HIGH (ref 0.3–1.2)
Total Protein: 4.9 g/dL — ABNORMAL LOW (ref 6.5–8.1)

## 2020-09-26 LAB — AMMONIA: Ammonia: 29 umol/L (ref 9–35)

## 2020-09-26 LAB — PROTIME-INR
INR: 1.9 — ABNORMAL HIGH (ref 0.8–1.2)
Prothrombin Time: 21.2 seconds — ABNORMAL HIGH (ref 11.4–15.2)

## 2020-09-26 LAB — GLUCOSE, CAPILLARY
Glucose-Capillary: 102 mg/dL — ABNORMAL HIGH (ref 70–99)
Glucose-Capillary: 112 mg/dL — ABNORMAL HIGH (ref 70–99)
Glucose-Capillary: 128 mg/dL — ABNORMAL HIGH (ref 70–99)
Glucose-Capillary: 151 mg/dL — ABNORMAL HIGH (ref 70–99)

## 2020-09-26 LAB — LIPASE, BLOOD: Lipase: 25 U/L (ref 11–51)

## 2020-09-26 LAB — MAGNESIUM: Magnesium: 1.5 mg/dL — ABNORMAL LOW (ref 1.7–2.4)

## 2020-09-26 LAB — PHOSPHORUS: Phosphorus: 4.4 mg/dL (ref 2.5–4.6)

## 2020-09-26 LAB — LACTIC ACID, PLASMA: Lactic Acid, Venous: 0.6 mmol/L (ref 0.5–1.9)

## 2020-09-26 MED ORDER — LACTATED RINGERS IV BOLUS
1000.0000 mL | Freq: Once | INTRAVENOUS | Status: AC
  Administered 2020-09-26: 1000 mL via INTRAVENOUS

## 2020-09-26 MED ORDER — LACTATED RINGERS IV SOLN: INTRAVENOUS | Status: DC

## 2020-09-26 MED ORDER — POTASSIUM CHLORIDE 10 MEQ/100ML IV SOLN
10.0000 meq | INTRAVENOUS | Status: AC
  Administered 2020-09-26 (×4): 10 meq via INTRAVENOUS
  Filled 2020-09-26 (×3): qty 100

## 2020-09-26 MED ORDER — PIPERACILLIN-TAZOBACTAM IN DEX 2-0.25 GM/50ML IV SOLN
2.2500 g | Freq: Three times a day (TID) | INTRAVENOUS | Status: DC
  Administered 2020-09-27 (×2): 2.25 g via INTRAVENOUS
  Filled 2020-09-26 (×3): qty 50

## 2020-09-26 MED ORDER — MAGNESIUM SULFATE 2 GM/50ML IV SOLN
2.0000 g | Freq: Once | INTRAVENOUS | Status: AC
  Administered 2020-09-26: 2 g via INTRAVENOUS
  Filled 2020-09-26: qty 50

## 2020-09-26 NOTE — Progress Notes (Signed)
PHARMACY NOTE:  ANTIMICROBIAL RENAL DOSAGE ADJUSTMENT  Current antimicrobial regimen includes a mismatch between antimicrobial dosage and estimated renal function.  As per policy approved by the Pharmacy & Therapeutics and Medical Executive Committees, the antimicrobial dosage will be adjusted accordingly.  Current antimicrobial dosage:  Zosyn 3.375 gr IV q8h EI   Indication: Obstructive jaundice with right upper quadrant pain  Renal Function:  Estimated Creatinine Clearance: 16.8 mL/min (A) (by C-G formula based on SCr of 2.71 mg/dL (H)). []      On intermittent HD, scheduled: []      On CRRT    Antimicrobial dosage has been changed to:  Zosyn 2.25 gr IV q8h   Additional comments:   Thank you for allowing pharmacy to be a part of this patient's care.  Royetta Asal, PharmD, BCPS 09/26/2020 7:59 PM

## 2020-09-26 NOTE — Progress Notes (Signed)
Cassandra Allen   DOB:1945/12/17   TI#:[id number]   ASN#:[id number]  Medical oncology followup note   Subjective: Patient is well-known to me, has been under my care for her metastatic colon cancer, on observation.  Patient presents with jaundice for 1 week, with decreased no significant pain.  She presented to local hospital in [place], CT scan showed worsening biliary dilatation.  She was transferred to [place] for further management  Objective:  Vitals:   10/15/20 1016 15-Oct-2020 1526  BP: (!) 96/52 (!) 98/43  Pulse: 72 71  Resp: 16 19  Temp: 98.9 F (37.2 C) 98.2 F (36.8 C)  SpO2: 93% 98%    Body mass index is 27.66 kg/m.  Intake/Output Summary (Last 24 hours) at 10-15-2020 1744 Last data filed at 10/15/2020 0705 Gross per 24 hour  Intake 418.81 ml  Output 50 ml  Net 368.81 ml     Sclerae unicteric  Oropharynx clear  No peripheral adenopathy  Lungs clear -- no rales or rhonchi  Heart regular rate and rhythm  Abdomen soft, nontender   MSK no focal spinal tenderness, no peripheral edema  Neuro nonfocal    CBG (last 3)  Recent Labs    10/15/2020 0007 10-15-2020 0812 10/15/2020 1316  GLUCAP 151* 128* 112*     Labs:  Urine Studies No results for input(s): UHGB, CRYS in the last 72 hours.  Invalid input(s): UACOL, UAPR, USPG, UPH, UTP, UGL, UKET, UBIL, UNIT, UROB, ULEU, UEPI, UWBC, URBC, UBAC, CAST, UCOM, BILUA  Basic Metabolic Panel: Recent Labs  Lab 15-Oct-2020 0714  NA 137  K 3.2*  CL 106  CO2 19*  GLUCOSE 108*  BUN 72*  CREATININE 2.71*  CALCIUM 8.0*  MG 1.5*  PHOS 4.4   GFR Estimated Creatinine Clearance: 16.8 mL/min (A) (by C-G formula based on SCr of 2.71 mg/dL (H)). Liver Function Tests: Recent Labs  Lab 15-Oct-2020 0714  AST 73*  ALT 69*  ALKPHOS 625*  BILITOT 9.8*  PROT 4.9*  ALBUMIN 2.0*   Recent Labs  Lab October 15, 2020 0714  LIPASE 25   Recent Labs  Lab 15-Oct-2020 0714  AMMONIA 29   Coagulation profile Recent Labs  Lab  Oct 15, 2020 0714  INR 1.9*    CBC: Recent Labs  Lab 10/15/20 0714  WBC 7.3  HGB 7.8*  HCT 25.7*  MCV 95.2  PLT 178   Cardiac Enzymes: No results for input(s): CKTOTAL, CKMB, CKMBINDEX, TROPONINI in the last 168 hours. BNP: Invalid input(s): POCBNP CBG: Recent Labs  Lab Oct 15, 2020 0007 October 15, 2020 0812 October 15, 2020 1316  GLUCAP 151* 128* 112*   D-Dimer No results for input(s): DDIMER in the last 72 hours. Hgb A1c No results for input(s): HGBA1C in the last 72 hours. Lipid Profile No results for input(s): CHOL, HDL, LDLCALC, TRIG, CHOLHDL, LDLDIRECT in the last 72 hours. Thyroid function studies No results for input(s): TSH, T4TOTAL, T3FREE, THYROIDAB in the last 72 hours.  Invalid input(s): FREET3 Anemia work up No results for input(s): VITAMINB12, FOLATE, FERRITIN, TIBC, IRON, RETICCTPCT in the last 72 hours. Microbiology No results found for this or any previous visit (from the past 240 hour(s)).    Studies:  US RENAL  Result Date: October 15, 2020 CLINICAL DATA:  Elevated creatinine. EXAM: RENAL / URINARY TRACT ULTRASOUND COMPLETE COMPARISON:  Abdominal CT June 04, 2019, abdominal MRI August 05, 2020 FINDINGS: Right Kidney: Renal measurements: 11.0 x 6.5 x 7.0 = volume: 268 mL. Echogenicity within normal limits. No mass or hydronephrosis visualized. Left Kidney: Renal measurements: 11.0  x 6.2 x 6.8 cm = volume: 240 mL. Echogenicity within normal limits. No mass or hydronephrosis visualized. Bladder: Appears normal for degree of bladder distention. Other: Incidental finding of layering sludge and small gallstones within the neck of the gallbladder. There is no evidence of gallbladder wall thickening. IMPRESSION: 1. Normal appearance of the kidneys and urinary bladder. 2. Cholelithiasis and gallbladder sludge without evidence of acute cholecystitis. Electronically Signed   By: Fidela Salisbury M.D.   On: 09/26/2020 13:56    Assessment: 74 y.o. with left side colon cancer and  presumed oligo liver metastasis, s/p chemo and left hemicolectomy on 12/27/2018 due to bowel obstruction   1.  Obstructive jaundice from liver metastasis 2.  Metastatic colon cancer to liver, s/p SBRT  3. HTN 4. T2DM 5. CHF, AF 6. Acute on chronic CKD   Plan:  -I reviewed her outside CT of abdomen and pelvis without contrast, which showed slightly increased size of liver met in the hepatic caudate lobe and severe biliary dilatation  -Patient has been seen by GI Dr. Paulita Fujita, waiting MRCP before decision on ERCP -pt is not sure if she wants PTC if ERCP and stent is not feasible. Pt has been overall very reluctant to do aggressive treatment, and has limited PS and social support. If ERCP is not feasible or stent placement not successful, I will discuss palliative care and hospice with her. I highly doubt she is a candidate for Whipple surgery, and chemotherapy will be difficult for her to take. She would not be a candidate for chemo if her liver function does not improve. -I will f/u in a few days, please call me if anything I can help with.    Truitt Merle, MD 09/26/2020  5:44 PM

## 2020-09-26 NOTE — Consult Note (Signed)
[facility] Gastroenterology Consultation Note  Referring Provider: No ref. provider found Primary Care Physician:  [name], MD  Reason for Consultation:  Jaundice, metastatic colon cancer  HPI: Cassandra Allen is a 74 y.o. female with history of colon cancer (with resection and colostomy) and liver metastasis.  In hospital last month . Has new onset jaundice over the past week.  No abdominal pain, hematemesis or blood in stool.  Imaging outside facility showed biliary ductal dilatation arising from tumor metastasis in caudate lobe.   Past Medical History:  Diagnosis Date  . Acute diastolic heart failure ([place]) 12/22/2018  . Arthritis   . Atrial fibrillation, chronic ([place]) 12/22/2018  . Cancer of left colon ([place]) 10/30/2018  . Cancer of sigmoid colon  12/27/2018  . Diabetes mellitus without complication ([place])   . Hypertension   . Obesity (BMI 30-39.9) 12/27/2018    Past Surgical History:  Procedure Laterality Date  . COLONOSCOPY  10/2018   Dr [name].  Large cancer at splenic flexure,  Bulky sigmoid colon mass, Numerous polyps  . Intra-abdominal abscess drainage  01/31/2019   Abscess drainage grew Enterococcus faecalis  . IR CATHETER TUBE CHANGE  06/05/2019  . IR IMAGING GUIDED PORT INSERTION  11/20/2018  . IR RADIOLOGIST EVAL & MGMT  06/04/2019  . IR RADIOLOGIST EVAL & MGMT  06/17/2019  . IR RADIOLOGIST EVAL & MGMT  09/16/2019  . LAPAROTOMY N/A 12/27/2018   Procedure: LEFT SIGMOID COLECTOMY WITH HARTMANN POUCH AND END COLOSTOMY;  Surgeon: [name], MD;  Location: [facility] ORS;  Service: General;  Laterality: N/A;    Prior to Admission medications   Medication Sig Start Date End Date Taking? Authorizing Provider  amLODipine (NORVASC) 5 MG tablet Take 5 mg by mouth daily.  08/21/19   [provider]  Ascorbic Acid (VITAMIN C WITH ROSE HIPS) 500 MG tablet Take 500 mg by mouth daily.    [provider]  fentaNYL (DURAGESIC) 25 MCG/HR Place 1 patch onto the skin every 3  (three) days. 09/04/20   Shelly Coss, MD  ferrous sulfate (KP FERROUS SULFATE) 325 (65 FE) MG tablet Take 1 tablet (325 mg total) by mouth daily with breakfast. 09/03/20   Shelly Coss, MD  hydrocortisone 1 % lotion Apply 1 application topically 2 (two) times daily. Patient not taking: Reported on 08/27/2020 06/21/20   Truitt Merle, MD  Insulin Detemir (LEVEMIR) 100 UNIT/ML Pen Inject 16 Units into the skin daily. 03/21/19   Hongalgi, Lenis Dickinson, MD  lidocaine (LIDODERM) 5 % Place 1 patch onto the skin daily. Remove & Discard patch within 12 hours or as directed by MD 09/03/20   Shelly Coss, MD  liraglutide (VICTOZA) 18 MG/3ML SOPN Inject 0.3 mLs (1.8 mg total) into the skin every morning. Patient not taking: Reported on 08/27/2020 03/20/19   Modena Jansky, MD  methocarbamol (ROBAXIN) 500 MG tablet Take 1 tablet (500 mg total) by mouth every 6 (six) hours as needed for muscle spasms. 09/03/20   Shelly Coss, MD  metoprolol tartrate (LOPRESSOR) 25 MG tablet Take 12.5 mg by mouth 2 (two) times daily.  12/02/19   [provider]  OVER THE COUNTER MEDICATION Take 1 tablet by mouth daily. Healthy eye suppliment    [provider]  oxybutynin (DITROPAN-XL) 10 MG 24 hr tablet Take 1 tablet (10 mg total) by mouth daily. Patient taking differently: Take 20 mg by mouth daily.  03/20/19   Hongalgi, Lenis Dickinson, MD  oxyCODONE (OXY IR/ROXICODONE) 5 MG immediate release  tablet Take 1 tablet (5 mg total) by mouth every 4 (four) hours as needed for moderate pain or severe pain. 09/03/20   Shelly Coss, MD  pantoprazole (PROTONIX) 40 MG tablet Take 1 tablet (40 mg total) by mouth daily. 09/03/20 11/02/20  Shelly Coss, MD  QUEtiapine (SEROQUEL) 25 MG tablet Take 12.5 mg by mouth at bedtime.  02/04/20   [provider]  sertraline (ZOLOFT) 100 MG tablet Take 100 mg by mouth in the morning and at bedtime.    [provider]  simvastatin (ZOCOR) 10 MG tablet Take 1 tablet (10 mg  total) by mouth daily at 6 PM. 03/20/19   Hongalgi, Lenis Dickinson, MD  ursodiol (ACTIGALL) 300 MG capsule Take 1 capsule (300 mg total) by mouth 2 (two) times daily. 09/03/20   Shelly Coss, MD    Current Facility-Administered Medications  Medication Dose Route Frequency Provider Last Rate Last Admin  . 0.9 %  sodium chloride infusion   Intravenous Continuous Pokhrel, Laxman, MD 100 mL/hr at 09/25/20 2359 New Bag at 09/25/20 2359  . heparin injection 5,000 Units  5,000 Units Subcutaneous Q8H Pokhrel, Laxman, MD   5,000 Units at 09/26/20 0559  . insulin aspart (novoLOG) injection 0-9 Units  0-9 Units Subcutaneous TID WC Pokhrel, Laxman, MD      . insulin detemir (LEVEMIR) injection 16 Units  16 Units Subcutaneous Daily Pokhrel, Laxman, MD   16 Units at 09/26/20 1002  . magnesium sulfate IVPB 2 g 50 mL  2 g Intravenous Once Florencia Reasons, MD      . metoprolol tartrate (LOPRESSOR) tablet 12.5 mg  12.5 mg Oral BID Florencia Reasons, MD      . ondansetron [facility]) tablet 4 mg  4 mg Oral Q6H PRN Pokhrel, Laxman, MD       Or  . ondansetron (ZOFRAN) injection 4 mg  4 mg Intravenous Q6H PRN Pokhrel, Laxman, MD      . oxybutynin (DITROPAN-XL) 24 hr tablet 20 mg  20 mg Oral Daily Pokhrel, Laxman, MD   20 mg at 09/26/20 0954  . pantoprazole (PROTONIX) EC tablet 40 mg  40 mg Oral Daily Pokhrel, Laxman, MD   40 mg at 09/26/20 0955  . piperacillin-tazobactam (ZOSYN) IVPB 3.375 g  3.375 g Intravenous Q8H Thomes Lolling, RPH 12.5 mL/hr at 09/26/20 1001 3.375 g at 09/26/20 1001  . potassium chloride 10 mEq in 100 mL IVPB  10 mEq Intravenous Q1 Hr x 4 Florencia Reasons, MD 100 mL/hr at 09/26/20 0957 10 mEq at 09/26/20 0957  . sodium chloride flush (NS) 0.9 % injection 3 mL  3 mL Intravenous Q12H Pokhrel, Laxman, MD      . ursodiol (ACTIGALL) capsule 300 mg  300 mg Oral BID Pokhrel, Laxman, MD   300 mg at 09/26/20 0955    Allergies as of 09/25/2020 - Review Complete 08/28/2020  Allergen Reaction Noted  . Metformin and related Nausea  And Vomiting 03/27/2016  . Prednisone Nausea And Vomiting 03/27/2016    Family History  Problem Relation Age of Onset  . Heart attack Mother   . Heart attack Father     Social History   Socioeconomic History  . Marital status: Divorced    Spouse name: Not on file  . Number of children: 2  . Years of education: Not on file  . Highest education level: Not on file  Occupational History  . Not on file  Tobacco Use  . Smoking status: Never Smoker  . Smokeless tobacco:  Never Used  Vaping Use  . Vaping Use: Never used  Substance and Sexual Activity  . Alcohol use: No  . Drug use: No  . Sexual activity: Not on file  Other Topics Concern  . Not on file  Social History Narrative  . Not on file   Social Determinants of Health   Financial Resource Strain:   . Difficulty of Paying Living Expenses: Not on file  Food Insecurity:   . Worried About Charity fundraiser in the Last Year: Not on file  . Ran Out of Food in the Last Year: Not on file  Transportation Needs:   . Lack of Transportation (Medical): Not on file  . Lack of Transportation (Non-Medical): Not on file  Physical Activity:   . Days of Exercise per Week: Not on file  . Minutes of Exercise per Session: Not on file  Stress:   . Feeling of Stress : Not on file  Social Connections:   . Frequency of Communication with Friends and Family: Not on file  . Frequency of Social Gatherings with Friends and Family: Not on file  . Attends Religious Services: Not on file  . Active Member of Clubs or Organizations: Not on file  . Attends Archivist Meetings: Not on file  . Marital Status: Not on file  Intimate Partner Violence:   . Fear of Current or Ex-Partner: Not on file  . Emotionally Abused: Not on file  . Physically Abused: Not on file  . Sexually Abused: Not on file    Review of Systems: As per HPI, all others negative Physical Exam: Vital signs in last 24 hours: Temp:  [97.3 F (36.3 C)-98.9 F (37.2  C)] 98.9 F (37.2 C) (11/14 1016) Pulse Rate:  [64-74] 72 (11/14 1016) Resp:  [16-20] 16 (11/14 1016) BP: (90-97)/(42-52) 96/52 (11/14 1016) SpO2:  [93 %-99 %] 93 % (11/14 1016) Weight:  [66.6 kg-68.6 kg] 68.6 kg (11/14 0702) Last BM Date: 09/25/20 General:   Alert,  Jaundiced, chronically ill-appearing Head:  Normocephalic and atraumatic. Eyes:  Sclera icteric bilaterally.  Conjunctiva pink. Ears:  Normal auditory acuity. Nose:  No deformity, discharge,  or lesions. Mouth:  No deformity or lesions.  Oropharynx pale and dry Neck:  Supple; no masses or thyromegaly. Lungs:  Clear throughout to auscultation.   No wheezes, crackles, or rhonchi. No acute distress. Heart:  Regular rate and rhythm; no murmurs, clicks, rubs,  or gallops. Abdomen:  Soft, mild distended, non-tender. No masses, hepatosplenomegaly or hernias noted. Normal bowel sounds, without guarding, and without rebound.     Msk:  Symmetrical without gross deformities. Normal posture. Pulses:  Normal pulses noted. Extremities:  Without clubbing or edema. Neurologic:  Alert and  oriented x4; diffusely weak, otherwise grossly normal neurologically. Skin:  Scattered ecchymoses, otherwise intact without significant lesions or rashes. Psych:  Alert and cooperative. Normal mood and affect.   Lab Results: Recent Labs    09/26/20 0714  WBC 7.3  HGB 7.8*  HCT 25.7*  PLT 178   BMET Recent Labs    09/26/20 0714  NA 137  K 3.2*  CL 106  CO2 19*  GLUCOSE 108*  BUN 72*  CREATININE 2.71*  CALCIUM 8.0*   LFT Recent Labs    09/26/20 0714  PROT 4.9*  ALBUMIN 2.0*  AST 73*  ALT 69*  ALKPHOS 625*  BILITOT 9.8*   PT/INR Recent Labs    09/26/20 0714  LABPROT 21.2*  INR 1.9*  Studies/Results: No results found.  Impression:  1.  Colon cancer, metastatic to liver, with resection and colostomy. 2.  Elevated liver enzymes. 3.  Dilated biliary tree.  Suspect obstruction from tumor metastasis.  Patient does not  appear to have cholangitis. 4.  Distended gallbladder with gallstones and mild GB wall thickening and mild cholecystic fluid.  Clinically patient does not have cholecystitis.  Suspect these are reactive findings from biliary obstruction. 5.  Acute renal failure. 6.  Multiple medical problems.  Plan:  1.  Medical management of acute renal failure. 2.  Clarification of the nature of her biliary obstruction (where obstruction is occurring, what part(s) of biliary tree are involved) is essential, in order to see whether ERCP would be helpful or whether PTC is best management strategy.  I can't glean that information from the CT report done yesterday (and no images are available).  If renal function significantly improves, would like MRI/MRCP w/contrast tomorrow; if not, would then do MRI/MRCP without contrast.   3.  Soft diet ok. 4.  Will need cardiology consultation for clearance prior to consideration of any endoscopic procedures. 5.  [facility] will follow.   LOS: 1 day   Antoninette Lerner M  09/26/2020, 11:21 AM  Cell [phone] If no answer or after 5 PM call [phone]

## 2020-09-26 NOTE — Progress Notes (Addendum)
PROGRESS NOTE    WING GFELLER  KMM:[id number] DOB: 04-08-1946 DOA: 09/25/2020 PCP: [name], MD    No chief complaint on file.   Brief Narrative:  Cassandra [place] is a 74 y.o. 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   CT scan done at OSH  showed worsening biliary dilatation and worsening metastasis, oncology Dr. Marin Olp was notified who recommended patient to be sent from outside hospital to [facility] GI notified  Subjective:   Denies pain, no n/v currently, + output from colostomy bag  Assessment & Plan:   Principal Problem:   Jaundice Active Problems:   Essential (primary) hypertension   Cancer of splenic flexure of colon   HLD (hyperlipidemia)   Atrial fibrillation, chronic (HCC)   CKD (chronic kidney disease) stage 3, GFR 30-59 ml/min (H[facility])  Obstructive jaundice with right upper quadrant pain -History of metastatic colon cancer to liver and adrenal met, + colostomy -She is started on Zosyn -Keep her n.p.o., continue hydration, antiemetics and analgesics as needed -Gi recommend cardiology consult for cardiac clearance , cardiology notified -will follow [facility] recommendation  Per secure chat communication, cardiology think patient is stable,  Call cardiology if needed    Acute metabolic encephalopathy -resolved, per son now back to baseline memory impairment  Hypokalemia/hypomagnesemia Replace IV K, IV mag, recheck in the morning  AKI on CKD 3 a -Urine culture in process -  renal ultrasound no obstruction -Continue hydration, repeat bmp in  am  History of hypertension Currently BP low normal, hold Norvasc, add holding parameters to Lopressor Continue IV hydration, will give 1 L of LR bolus  Chronic atrial fibrillation.  On metoprolol  at home.  Currently not on anticoagulation  Chronic diastolic CHF Currently appears dehydrated Monitor volume status   Insulin-dependent type 2 diabetes, poorly controlled On reduced dose of Levemir here due to poor oral intake, continue SSI  FTT with baseline memory impairment Had good family supports, baseline walks short distance with a walker Will get PT eval    DVT prophylaxis: heparin injection 5,000 Units Start: 09/26/20 0600   Code Status:full Family Communication: son at bedside Disposition:   Status is: Inpatient  Dispo: The patient is from: home              Anticipated d/c is to: home              Anticipated d/c date is: TBD               Consultants:   [facility]  cardiology  Procedures:   Per GI  Antimicrobials:   Zosyn     Objective: Vitals:   09/25/20 2100 09/26/20 0203 09/26/20 0702  BP: (!) 92/46 (!) 90/42 (!) 97/48  Pulse: 74 64 72  Resp: _0 Temp: 97.6 F (36.4 C) (!) 97.3 F (36.3 C) 98 F (36.7 C)  TempSrc: Oral Oral Oral  SpO2: 96% 99% 99%  Weight: 66.6 kg  68.6 kg    Intake/Output Summary (Last 24 hours) at 09/26/2020 0852 Last data filed at 09/26/2020 0705 Gross per 24 hour  Intake 418.81 ml  Output 50 ml  Net 368.81  ml   Filed Weights   09/25/20 2100 09/26/20 0702  Weight: 66.6 kg 68.6 kg    Examination:  General exam: calm, NAD, impaired memory ( at baseline per son) Respiratory system: Clear to auscultation. Respiratory effort normal. Cardiovascular system: S1 & S2 heard, IRRR. No JVD, + murmur, No pedal edema. Gastrointestinal system: Abdomen is nondistended, soft and nontender.  Old surgical scar, +colostomy. Normal bowel sounds heard. Central nervous system: Alert and oriented to person and place,  confused about time. No focal neurological deficits. Extremities: generalized weakness. Skin: No rashes, lesions or ulcers Psychiatry: slightly confused, calm and cooperative    Data Reviewed: I have personally reviewed following labs and imaging studies  CBC: Recent Labs  Lab 09/26/20 0714  WBC 7.3  HGB 7.8*  HCT 25.7*  MCV 95.2  PLT 815    Basic Metabolic Panel: Recent Labs  Lab 09/26/20 0714  NA 137  K 3.2*  CL 106  CO2 19*  GLUCOSE 108*  BUN 72*  CREATININE 2.71*  CALCIUM 8.0*  MG 1.5*  PHOS 4.4    GFR: Estimated Creatinine Clearance: 16.8 mL/min (A) (by C-G formula based on SCr of 2.71 mg/dL (H)).  Liver Function Tests: Recent Labs  Lab 09/26/20 0714  AST 73*  ALT 69*  ALKPHOS 625*  BILITOT 9.8*  PROT 4.9*  ALBUMIN 2.0*    CBG: Recent Labs  Lab 09/26/20 0007 09/26/20 0812  GLUCAP 151* 128*     No results found for this or any previous visit (from the past 240 hour(s)).       Radiology Studies: No results found.      Scheduled Meds: . heparin injection (subcutaneous)  5,000 Units Subcutaneous Q8H  . insulin aspart  0-9 Units Subcutaneous TID WC  . insulin detemir  16 Units Subcutaneous Daily  . metoprolol tartrate  12.5 mg Oral BID  . oxybutynin  20 mg Oral Daily  . pantoprazole  40 mg Oral Daily  . sodium chloride flush  3 mL Intravenous Q12H  . ursodiol  300 mg Oral BID   Continuous Infusions: . sodium chloride 100 mL/hr at 09/25/20 2359  . lactated ringers    . magnesium sulfate bolus IVPB    . piperacillin-tazobactam (ZOSYN)  IV 3.375 g (09/26/20 0209)  . potassium chloride       LOS: 1 day   Time spent: 48mns Greater than 50% of this time was spent in counseling, explanation of diagnosis, planning of further management, and coordination of care.  I have personally reviewed and interpreted on  09/26/2020 daily labs, tele strips, I reviewed all nursing notes, pharmacy notes, consultant notes,  vitals, pertinent  old records  I have discussed plan of care as described above with RN , patient and family on 09/26/2020  Voice Recognition /Dragon dictation system was used to create this note, attempts have been made to correct errors. Please contact the author with questions and/or clarifications.   FFlorencia Reasons MD PhD FACP [facility]  Available via Epic secure chat 7am-7pm for nonurgent issues Please page for urgent issues To page the attending provider between 7A-7P or the covering provider during after hours 7P-7A, please log into the web site [email or web address] and access using universal [place] password for that web site. If you do not have the password, please call the hospital operator.    09/26/2020, 8:52 AM

## 2020-09-27 ENCOUNTER — Inpatient Hospital Stay (HOSPITAL_COMMUNITY): Payer: Medicare (Managed Care)

## 2020-09-27 DIAGNOSIS — R17 Unspecified jaundice: Secondary | ICD-10-CM | POA: Diagnosis not present

## 2020-09-27 LAB — CBC WITH DIFFERENTIAL/PLATELET
Abs Immature Granulocytes: 0.12 10*3/uL — ABNORMAL HIGH (ref 0.00–0.07)
Basophils Absolute: 0 10*3/uL (ref 0.0–0.1)
Basophils Relative: 0 %
Eosinophils Absolute: 0 10*3/uL (ref 0.0–0.5)
Eosinophils Relative: 0 %
HCT: 23.2 % — ABNORMAL LOW (ref 36.0–46.0)
Hemoglobin: 7.2 g/dL — ABNORMAL LOW (ref 12.0–15.0)
Immature Granulocytes: 1 %
Lymphocytes Relative: 1 %
Lymphs Abs: 0.1 10*3/uL — ABNORMAL LOW (ref 0.7–4.0)
MCH: 28.9 pg (ref 26.0–34.0)
MCHC: 31 g/dL (ref 30.0–36.0)
MCV: 93.2 fL (ref 80.0–100.0)
Monocytes Absolute: 1 10*3/uL (ref 0.1–1.0)
Monocytes Relative: 11 %
Neutro Abs: 7.9 10*3/uL — ABNORMAL HIGH (ref 1.7–7.7)
Neutrophils Relative %: 87 %
Platelets: 177 10*3/uL (ref 150–400)
RBC: 2.49 MIL/uL — ABNORMAL LOW (ref 3.87–5.11)
RDW: 16.5 % — ABNORMAL HIGH (ref 11.5–15.5)
WBC: 9.2 10*3/uL (ref 4.0–10.5)
nRBC: 0 % (ref 0.0–0.2)

## 2020-09-27 LAB — GLUCOSE, CAPILLARY
Glucose-Capillary: 101 mg/dL — ABNORMAL HIGH (ref 70–99)
Glucose-Capillary: 97 mg/dL (ref 70–99)
Glucose-Capillary: 89 mg/dL (ref 70–99)

## 2020-09-27 LAB — COMPREHENSIVE METABOLIC PANEL
ALT: 65 U/L — ABNORMAL HIGH (ref 0–44)
AST: 71 U/L — ABNORMAL HIGH (ref 15–41)
Albumin: 1.7 g/dL — ABNORMAL LOW (ref 3.5–5.0)
Alkaline Phosphatase: 566 U/L — ABNORMAL HIGH (ref 38–126)
Anion gap: 9 (ref 5–15)
BUN: 62 mg/dL — ABNORMAL HIGH (ref 8–23)
CO2: 18 mmol/L — ABNORMAL LOW (ref 22–32)
Calcium: 7.8 mg/dL — ABNORMAL LOW (ref 8.9–10.3)
Chloride: 106 mmol/L (ref 98–111)
Creatinine, Ser: 2.63 mg/dL — ABNORMAL HIGH (ref 0.44–1.00)
GFR, Estimated: 19 mL/min — ABNORMAL LOW (ref >60)
Glucose, Bld: 126 mg/dL — ABNORMAL HIGH (ref 70–99)
Potassium: 3.4 mmol/L — ABNORMAL LOW (ref 3.5–5.1)
Sodium: 133 mmol/L — ABNORMAL LOW (ref 135–145)
Total Bilirubin: 9.7 mg/dL — ABNORMAL HIGH (ref 0.3–1.2)
Total Protein: 4.5 g/dL — ABNORMAL LOW (ref 6.5–8.1)

## 2020-09-27 LAB — URINE CULTURE

## 2020-09-27 LAB — MAGNESIUM: Magnesium: 1.8 mg/dL (ref 1.7–2.4)

## 2020-09-27 MED ORDER — CHLORHEXIDINE GLUCONATE CLOTH 2 % EX PADS
6.0000 | MEDICATED_PAD | Freq: Every day | CUTANEOUS | Status: DC
  Administered 2020-09-27 – 2020-10-02 (×4): 6 via TOPICAL

## 2020-09-27 MED ORDER — POTASSIUM CHLORIDE 10 MEQ/100ML IV SOLN
10.0000 meq | INTRAVENOUS | Status: AC
  Administered 2020-09-27 (×3): 10 meq via INTRAVENOUS
  Filled 2020-09-27 (×2): qty 100

## 2020-09-27 NOTE — Evaluation (Signed)
Physical Therapy Evaluation Patient Details Name: Cassandra Allen MRN: [id number] DOB: 1946/07/11 Today's Date: 09/27/2020   History of Present Illness  74 y.o. with left side colon cancer and presumed oligo liver metastasis, s/p chemo and left hemicolectomy due to bowel obstruction on 12/27/2018. Patient presented to OSH with jaundice for 1 week, with no significant pain. CT revealed biliary dutal dilation related to liver mets, patient was then transferred to [facility] for management.    Clinical Impression  Cassandra Allen is 74 y.o. female admitted with above HPI and diagnosis. Patient is currently limited by functional impairments below (see PT problem list). Patient lives with her son and daughter-in-llaw and is modified independent with RW for household mobility with assist PRN for ADL's at baseline. She currently requires min-mod assist for mobility and is unsteady with gait with RW. Patient will benefit from continued skilled PT interventions to address impairments and progress independence with mobility, recommending HHPT. Acute PT will follow and progress as able.     Follow Up Recommendations Home health PT;Supervision/Assistance - 24 hour    Equipment Recommendations  None recommended by PT    Recommendations for Other Services       Precautions / Restrictions Precautions Precautions: Fall Restrictions Weight Bearing Restrictions: No      Mobility  Bed Mobility Overal bed mobility: Needs Assistance Bed Mobility: Supine to Sit     Supine to sit: HOB elevated;Mod assist     General bed mobility comments: cues or use of bed rail, assist required for LE's to move off EOB and to bring trunk upright.     Transfers Overall transfer level: Needs assistance Equipment used: Rolling walker (2 wheeled) Transfers: Sit to/from [facility] Sit to Stand: Min assist;Mod assist;From elevated surface Stand pivot transfers: Min assist       General  transfer comment: cues for hand placement with RW for power up. Mod assist initially and min assist on 2nd and 3rd stands from EOB and BSC.  Ambulation/Gait Ambulation/Gait assistance: Min assist Gait Distance (Feet): 8 Feet Assistive device: Rolling walker (2 wheeled) Gait Pattern/deviations: Step-through pattern;Decreased step length - right;Decreased step length - left;Decreased stride length;Trunk flexed Gait velocity: decr   General Gait Details: assist required and cues for safe management of RW and to prevent LOB with turn to sit in recliner.  Stairs            Wheelchair Mobility    Modified Rankin (Stroke Patients Only)       Balance Overall balance assessment: Needs assistance Sitting-balance support: Feet supported Sitting balance-Leahy Scale: Fair     Standing balance support: During functional activity;Bilateral upper extremity supported Standing balance-Leahy Scale: Poor Standing balance comment: pt required single UE support and min guard/assist while standing to complete pericare.                             Pertinent Vitals/Pain Pain Assessment: No/denies pain    Home Living Family/patient expects to be discharged to:: Private residence Living Arrangements: Children;Other relatives (son and his wife) Available Help at Discharge: Family;Available PRN/intermittently Type of Home: House Home Access: Stairs to enter Entrance Stairs-Rails: Right Entrance Stairs-Number of Steps: 2 Home Layout: One level Home Equipment: Walker - 4 wheels;Shower seat;Hand held shower head;Toilet riser;Wheelchair - [employer] - 2 wheels;Bedside commode      Prior Function Level of Independence: Independent with assistive device(s);Needs assistance   Gait / Transfers Assistance Needed: pt uses  RW to mobilize short distances room<>room in home. sometimes she needs assist to get up from chair or commode.  ADL's / Homemaking Assistance Needed: pt does  typically dress herself and uses seat to shower; occasionally needs assistance from her daughter-in-law.        Hand Dominance   Dominant Hand: Right    Extremity/Trunk Assessment   Upper Extremity Assessment Upper Extremity Assessment: Generalized weakness    Lower Extremity Assessment Lower Extremity Assessment: Generalized weakness    Cervical / Trunk Assessment Cervical / Trunk Assessment: Kyphotic  Communication   Communication: No difficulties  Cognition Arousal/Alertness: Awake/alert Behavior During Therapy: WFL for tasks assessed/performed Overall Cognitive Status: Impaired/Different from baseline Area of Impairment: Orientation;Safety/judgement;Problem solving;Memory                 Orientation Level: Disoriented to;Situation;Time   Memory: Decreased short-term memory   Safety/Judgement: Decreased awareness of deficits   Problem Solving: Slow processing;Decreased initiation;Requires verbal cues General Comments: pt's son reports changes in memory and cognition, pt reporting greater independence with mobility and ADL's than her recent requirements.      General Comments      Exercises     Assessment/Plan    PT Assessment Patient needs continued PT services  PT Problem List Decreased strength;Decreased activity tolerance;Decreased balance;Decreased mobility;Decreased knowledge of use of DME;Decreased safety awareness;Decreased knowledge of precautions       PT Treatment Interventions DME instruction;Gait training;Functional mobility training;Therapeutic activities;Therapeutic exercise;Balance training;Stair training;Patient/family education    PT Goals (Current goals can be found in the Care Plan section)  Acute Rehab PT Goals Patient Stated Goal: none stated PT Goal Formulation: With patient Time For Goal Achievement: 10/11/20 Potential to Achieve Goals: Good    Frequency Min 3X/week   Barriers to discharge        Co-evaluation                AM-PAC PT "6 Clicks" Mobility  Outcome Measure Help needed turning from your back to your side while in a flat bed without using bedrails?: A Little Help needed moving from lying on your back to sitting on the side of a flat bed without using bedrails?: A Lot Help needed moving to and from a bed to a chair (including a wheelchair)?: A Lot Help needed standing up from a chair using your arms (e.g., wheelchair or bedside chair)?: A Lot Help needed to walk in hospital room?: A Little Help needed climbing 3-5 steps with a railing? : A Lot 6 Click Score: 14    End of Session Equipment Utilized During Treatment: Gait belt Activity Tolerance: Patient tolerated treatment well Patient left: in chair;with call bell/phone within reach;with chair alarm set;with family/visitor present Nurse Communication: Mobility status PT Visit Diagnosis: Muscle weakness (generalized) (M62.81);Unsteadiness on feet (R26.81);Difficulty in walking, not elsewhere classified (R26.2)    Time: 0093-8182 PT Time Calculation (min) (ACUTE ONLY): 31 min   Charges:   PT Evaluation $PT Eval Low Complexity: 1 Low PT Treatments $Therapeutic Activity: 8-22 mins        Verner Mould, DPT Acute Rehabilitation Services  Office [phone] Pager [phone]  09/27/2020 2:40 PM

## 2020-09-27 NOTE — Progress Notes (Signed)
PROGRESS NOTE    TALYN EDDIE  ZOX:[id number] DOB: 1945-12-27 DOA: 09/25/2020 PCP: [name], MD   Chief Complain: Jaundice, abdominal pain  Brief Narrative: Patient is a 74 year old female with past medical history of metastatic colon cancer to liver, adrenal gland status post colectomy/colostomy, chronic A. fib, CKD, diastolic congestive heart failure who presented to the emergency department with complaints of abdominal pain, nausea, vomiting, increased jaundice, loss of appetite.  She was recently admitted and discharged at this hospital for biliary obstruction secondary to hepatic metastasis compressing the biliary duct.  On presentation, elevated bilirubin in the range of 13.  CT imaging showed worsening biliary dilation worsening metastasis.  She was also found to have severe AKI with creatinine in the range of 2.7, elevated alkaline phosphatase.  Oncology, GI consulted.  Went MRCP today.  Plan for ERCP as per GI.  Assessment & Plan:   Principal Problem:   Jaundice Active Problems:   Essential (primary) hypertension   Cancer of splenic flexure of colon   HLD (hyperlipidemia)   Atrial fibrillation, chronic (HCC)   CKD (chronic kidney disease) stage 3, GFR 30-59 ml/min (HCC)   Right upper quadrant pain/obstructive jaundice: She has history of metastatic colon cancer to liver/tadrenal  gland.  She is a status post colostomy.  She has been started on Zosyn for the suspicion of cholangitis which has beeb D/Ced now.   Currently on IV fluids, continue antiemetics, analgesics. Underwent MRCP today showed markedly progressed intrahepatic biliary duct dilatation since 08/05/2020 with suspicion for metastatic obstructive process. Continue ursodiol.  She has elevated ALP, AST, ALT  Acute metabolic encephalopathy: Resolved, currently alert and awake.  She has memory impairment at baseline  Normocytic anemia: Hemoglobin in the range of 7.  Anemia is most likely associated with  malignancy.we will check iron studies  Electrolyte imbalance: Continue to monitor and supplement  AKI : Continue gentle IV fluids.  Renal ultrasound did not show any obstruction.  Creatinine on 09/02/2020 was 0.7  Hypertension: On presentation BP was soft.  Antihypertensives on hold.  Chronic A. fib: On metoprolol for rate control at home.  Not on anticoagulation.  Chronic diastolic congestive heart failure: Appeared dehydrated on presentation.  IV fluids to be continued   Insulin-dependent diabetes mellitus: Continue sliding scale insulin.  Continue to monitor blood sugars   Failure to thrive/deconditioning/debility: Uses walker for ambulation at home.  PT consulted and recommended HH        DVT prophylaxis: Heparin [place] Code Status: Full code Family Communication: None at bed side Status is: Inpatient  Remains inpatient appropriate because:Inpatient level of care appropriate due to severity of illness   Dispo: The patient is from: Home              Anticipated d/c is to: Home              Anticipated d/c date is: 2 days              Patient currently is not medically stable to d/c.     Consultants: Gi  Procedures:None  Antimicrobials:  Anti-infectives (From admission, onward)   Start     Dose/Rate Route Frequency Ordered Stop   09/27/20 0200  piperacillin-tazobactam (ZOSYN) IVPB 2.25 g        2.25 g 100 mL/hr over 30 Minutes Intravenous Every 8 hours 09/26/20 2000     09/26/20 0200  piperacillin-tazobactam (ZOSYN) IVPB 3.375 g  Status:  Discontinued        3.375  g 12.5 mL/hr over 240 Minutes Intravenous Every 8 hours 09/25/20 2356 09/26/20 1959      Subjective: Patient seen and examined at the bedside this afternoon.  Comfortable and eating her lunch during my evaluation.  Denies any abdomen pain, nausea or vomiting. Very icteric    Objective: Vitals:   09/26/20 2300 09/27/20 0449 09/27/20 1051 09/27/20 1443  BP: (!) 103/41 (!) 104/40 (!) 115/51 (!) 103/54    Pulse: 92 82 71 79  Resp: 14 14 17 18   Temp: 98.8 F (37.1 C) 98.9 F (37.2 C) 97.8 F (36.6 C) (!) 97.3 F (36.3 C)  TempSrc: Oral Oral Oral Oral  SpO2: 98% 94% 95% 99%  Weight:  72 kg      Intake/Output Summary (Last 24 hours) at 09/27/2020 1521 Last data filed at 09/27/2020 0509 Gross per 24 hour  Intake 2654.41 ml  Output 1000 ml  Net 1654.41 ml   Filed Weights   09/25/20 2100 09/26/20 0702 09/27/20 0449  Weight: 66.6 kg 68.6 kg 72 kg    Examination:  General exam: Icteric, chronically ill looking Respiratory system: Bilateral equal air entry, normal vesicular breath sounds, no wheezes or crackles  Cardiovascular system: S1 & S2 heard, RRR. No JVD, murmurs, rubs, gallops or clicks. No pedal edema.  Chemo-Port on the right chest Gastrointestinal system: Abdomen is distended, soft and nontender. No organomegaly or masses felt. Normal bowel sounds heard. Central nervous system: Alert and oriented. No focal neurological deficits. Extremities: No edema, no clubbing ,no cyanosis Skin: No rashes, lesions or ulcers,no icterus ,no pallor    Data Reviewed: I have personally reviewed following labs and imaging studies  CBC: Recent Labs  Lab 09/26/20 0714 09/27/20 0516  WBC 7.3 9.2  NEUTROABS  --  7.9*  HGB 7.8* 7.2*  HCT 25.7* 23.2*  MCV 95.2 93.2  PLT 178 638   Basic Metabolic Panel: Recent Labs  Lab 09/26/20 0714 09/27/20 0516  NA 137 133*  K 3.2* 3.4*  CL 106 106  CO2 19* 18*  GLUCOSE 108* 126*  BUN 72* 62*  CREATININE 2.71* 2.63*  CALCIUM 8.0* 7.8*  MG 1.5* 1.8  PHOS 4.4  --    GFR: Estimated Creatinine Clearance: 17.7 mL/min (A) (by C-G formula based on SCr of 2.63 mg/dL (H)). Liver Function Tests: Recent Labs  Lab 09/26/20 0714 09/27/20 0516  AST 73* 71*  ALT 69* 65*  ALKPHOS 625* 566*  BILITOT 9.8* 9.7*  PROT 4.9* 4.5*  ALBUMIN 2.0* 1.7*   Recent Labs  Lab 09/26/20 0714  LIPASE 25   Recent Labs  Lab 09/26/20 0714  AMMONIA 29    Coagulation Profile: Recent Labs  Lab 09/26/20 0714  INR 1.9*   Cardiac Enzymes: No results for input(s): CKTOTAL, CKMB, CKMBINDEX, TROPONINI in the last 168 hours. BNP (last 3 results) No results for input(s): PROBNP in the last 8760 hours. HbA1C: No results for input(s): HGBA1C in the last 72 hours. CBG: Recent Labs  Lab 09/26/20 0812 09/26/20 1316 09/26/20 1811 09/27/20 0747 09/27/20 1200  GLUCAP 128* 112* 102* 101* 97   Lipid Profile: No results for input(s): CHOL, HDL, LDLCALC, TRIG, CHOLHDL, LDLDIRECT in the last 72 hours. Thyroid Function Tests: No results for input(s): TSH, T4TOTAL, FREET4, T3FREE, THYROIDAB in the last 72 hours. Anemia Panel: No results for input(s): VITAMINB12, FOLATE, FERRITIN, TIBC, IRON, RETICCTPCT in the last 72 hours. Sepsis Labs: Recent Labs  Lab 09/26/20 0714  LATICACIDVEN 0.6    Recent Results (from the  past 240 hour(s))  Culture, Urine     Status: Abnormal   Collection Time: 09/26/20  1:07 AM   Specimen: Urine, Clean Catch  Result Value Ref Range Status   Specimen Description   Final    URINE, CLEAN CATCH Performed at [facility], [place] [address], [place], [place] [address]    Special Requests   Final    NONE Performed at [facility], [place] [address] [place], [place] [address]    Culture MULTIPLE SPECIES PRESENT, SUGGEST RECOLLECTION (A)  Final   Report Status 09/27/2020 FINAL  Final         Radiology Studies: US RENAL  Result Date: 09/26/2020 CLINICAL DATA:  Elevated creatinine. EXAM: RENAL / URINARY TRACT ULTRASOUND COMPLETE COMPARISON:  Abdominal CT June 04, 2019, abdominal MRI August 05, 2020 FINDINGS: Right Kidney: Renal measurements: 11.0 x 6.5 x 7.0 = volume: 268 mL. Echogenicity within normal limits. No mass or hydronephrosis visualized. Left Kidney: Renal measurements: 11.0 x 6.2 x 6.8 cm = volume: 240 mL. Echogenicity within normal limits. No mass or hydronephrosis  visualized. Bladder: Appears normal for degree of bladder distention. Other: Incidental finding of layering sludge and small gallstones within the neck of the gallbladder. There is no evidence of gallbladder wall thickening. IMPRESSION: 1. Normal appearance of the kidneys and urinary bladder. 2. Cholelithiasis and gallbladder sludge without evidence of acute cholecystitis. Electronically Signed   By: Fidela Salisbury M.D.   On: 09/26/2020 13:56   MR ABDOMEN MRCP WO CONTRAST  Result Date: 09/27/2020 CLINICAL DATA:  Jaundice.  Metastatic colon cancer with liver mets. EXAM: MRI ABDOMEN WITHOUT CONTRAST  (INCLUDING MRCP) TECHNIQUE: Multiplanar multisequence MR imaging of the abdomen was performed. Heavily T2-weighted images of the biliary and pancreatic ducts were obtained, and three-dimensional MRCP images were rendered by post processing. COMPARISON:  08/05/2020 FINDINGS: Lower chest: Small bilateral pleural effusions with bibasilar collapse/consolidation, right greater than left. Hepatobiliary: Intrahepatic biliary duct dilatation is markedly progressed since 08/05/2020. Biliary dilatation is symmetric in the right and left hepatic lobes and both the right and left ductal anatomy becomes obliterated in the region of the ductal confluence at the porta hepatis. Common bile duct reconstitutes in the head of pancreas, nondilated at 6 mm diameter. The area of ductal termination does not appear to be immediately adjacent to the known caudate lesion. Assessment of the region is somewhat limited by motion artifact and lack of intravenous contrast on today's exam. There is some amorphous abnormal T1 signal in the porta hepatis which may represent metastatic spread. Gallbladder is markedly distended with layering tiny stones. Pancreas: No focal mass lesion. No dilatation of the main duct. No intraparenchymal cyst. No peripancreatic edema. Spleen: Scattered tiny T2 hyperintense foci in the splenic parenchyma are stable,  likely benign. Adrenals/Urinary Tract: Right adrenal gland unremarkable. Left adrenal mass measures 3.0 x 2.7 cm today compared to 3.2 x 3.0 cm previously, not substantially changed. Stable tiny T2 hyperintensity in the interpolar right kidney, likely a cyst. Left kidney unremarkable. Stomach/Bowel: Stomach is unremarkable. No gastric wall thickening. No evidence of outlet obstruction. Duodenum is normally positioned as is the ligament of Treitz. No small bowel or colonic dilatation within the visualized abdomen. Vascular/Lymphatic: No abdominal aortic aneurysm. There is persistent flow signal in the portal vein although portal vein appears attenuated in the hepatoduodenal ligament. Flow signal persists in the superior mesenteric vein and splenic vein. Other: Perihepatic ascites again noted with fluid in the paracolic gutter bilaterally, right greater  than left. Body wall edema evident. Musculoskeletal: No overtly suspicious marrow signal abnormality within the visualized bony anatomy. IMPRESSION: 1. Markedly progressed intrahepatic biliary duct dilatation since 08/05/2020. Both the left and right hepatic ducts become obliterated in the region of the biliary confluence near the porta hepatis. There is some amorphous abnormal T1 signal in the porta hepatis which may represent metastatic involvement although a discrete obstructing mass lesion cannot be discerned. Common bile duct in the head of pancreas is nondilated. 2. Persistent flow signal in the portal vein although portal vein appears attenuated in the hepatoduodenal ligament. 3. Cholelithiasis. Prominent gallbladder distension. Similar to 08/05/2020. 4. Similar appearance of the left adrenal mass measuring 3.0 x 2.7 cm today compared to 3.0 x 3.2 cm previously. 5. No substantial change in small volume ascites. 6. Small bilateral pleural effusions with bibasilar collapse/consolidation, right greater than left. Electronically Signed   By: Misty Stanley M.D.   On:  09/27/2020 11:01   MR 3D Recon At Scanner  Result Date: 09/27/2020 CLINICAL DATA:  Jaundice.  Metastatic colon cancer with liver mets. EXAM: MRI ABDOMEN WITHOUT CONTRAST  (INCLUDING MRCP) TECHNIQUE: Multiplanar multisequence MR imaging of the abdomen was performed. Heavily T2-weighted images of the biliary and pancreatic ducts were obtained, and three-dimensional MRCP images were rendered by post processing. COMPARISON:  08/05/2020 FINDINGS: Lower chest: Small bilateral pleural effusions with bibasilar collapse/consolidation, right greater than left. Hepatobiliary: Intrahepatic biliary duct dilatation is markedly progressed since 08/05/2020. Biliary dilatation is symmetric in the right and left hepatic lobes and both the right and left ductal anatomy becomes obliterated in the region of the ductal confluence at the porta hepatis. Common bile duct reconstitutes in the head of pancreas, nondilated at 6 mm diameter. The area of ductal termination does not appear to be immediately adjacent to the known caudate lesion. Assessment of the region is somewhat limited by motion artifact and lack of intravenous contrast on today's exam. There is some amorphous abnormal T1 signal in the porta hepatis which may represent metastatic spread. Gallbladder is markedly distended with layering tiny stones. Pancreas: No focal mass lesion. No dilatation of the main duct. No intraparenchymal cyst. No peripancreatic edema. Spleen: Scattered tiny T2 hyperintense foci in the splenic parenchyma are stable, likely benign. Adrenals/Urinary Tract: Right adrenal gland unremarkable. Left adrenal mass measures 3.0 x 2.7 cm today compared to 3.2 x 3.0 cm previously, not substantially changed. Stable tiny T2 hyperintensity in the interpolar right kidney, likely a cyst. Left kidney unremarkable. Stomach/Bowel: Stomach is unremarkable. No gastric wall thickening. No evidence of outlet obstruction. Duodenum is normally positioned as is the ligament  of Treitz. No small bowel or colonic dilatation within the visualized abdomen. Vascular/Lymphatic: No abdominal aortic aneurysm. There is persistent flow signal in the portal vein although portal vein appears attenuated in the hepatoduodenal ligament. Flow signal persists in the superior mesenteric vein and splenic vein. Other: Perihepatic ascites again noted with fluid in the paracolic gutter bilaterally, right greater than left. Body wall edema evident. Musculoskeletal: No overtly suspicious marrow signal abnormality within the visualized bony anatomy. IMPRESSION: 1. Markedly progressed intrahepatic biliary duct dilatation since 08/05/2020. Both the left and right hepatic ducts become obliterated in the region of the biliary confluence near the porta hepatis. There is some amorphous abnormal T1 signal in the porta hepatis which may represent metastatic involvement although a discrete obstructing mass lesion cannot be discerned. Common bile duct in the head of pancreas is nondilated. 2. Persistent flow signal in the portal vein  although portal vein appears attenuated in the hepatoduodenal ligament. 3. Cholelithiasis. Prominent gallbladder distension. Similar to 08/05/2020. 4. Similar appearance of the left adrenal mass measuring 3.0 x 2.7 cm today compared to 3.0 x 3.2 cm previously. 5. No substantial change in small volume ascites. 6. Small bilateral pleural effusions with bibasilar collapse/consolidation, right greater than left. Electronically Signed   By: Misty Stanley M.D.   On: 09/27/2020 11:01        Scheduled Meds: . heparin injection (subcutaneous)  5,000 Units Subcutaneous Q8H  . insulin aspart  0-9 Units Subcutaneous TID WC  . insulin detemir  16 Units Subcutaneous Daily  . metoprolol tartrate  12.5 mg Oral BID  . oxybutynin  20 mg Oral Daily  . pantoprazole  40 mg Oral Daily  . sodium chloride flush  3 mL Intravenous Q12H  . ursodiol  300 mg Oral BID   Continuous Infusions: . lactated  ringers Stopped (09/26/20 1827)  . piperacillin-tazobactam (ZOSYN)  IV 2.25 g (09/27/20 1058)     LOS: 2 days    Time spent: 35 mins.More than 50% of that time was spent in counseling and/or coordination of care.      Shelly Coss, MD [facility] P11/15/2021, 3:21 PM

## 2020-09-27 NOTE — Progress Notes (Signed)
[facility] Gastroenterology Progress Note  HISAKO BUGH 74 y.o. 1946/07/31   Subjective: Sleeping and slow to wake up. Denies abdominal pain, N/V.  Objective: Vital signs: Vitals:   09/26/20 2300 09/27/20 0449  BP: (!) 103/41 (!) 104/40  Pulse: 92 82  Resp: 14 14  Temp: 98.8 F (37.1 C) 98.9 F (37.2 C)  SpO2: 98% 94%    Physical Exam: Gen: lethargic, no acute distress  HEENT: +icteric sclera CV: RRR Chest: CTA B Abd: distended, diffuse tenderness with guarding, +BS Ext: no edema  Lab Results: Recent Labs    09/26/20 0714 09/27/20 0516  NA 137 133*  K 3.2* 3.4*  CL 106 106  CO2 19* 18*  GLUCOSE 108* 126*  BUN 72* 62*  CREATININE 2.71* 2.63*  CALCIUM 8.0* 7.8*  MG 1.5* 1.8  PHOS 4.4  --    Recent Labs    09/26/20 0714 09/27/20 0516  AST 73* 71*  ALT 69* 65*  ALKPHOS 625* 566*  BILITOT 9.8* 9.7*  PROT 4.9* 4.5*  ALBUMIN 2.0* 1.7*   Recent Labs    09/26/20 0714 09/27/20 0516  WBC 7.3 9.2  NEUTROABS  --  7.9*  HGB 7.8* 7.2*  HCT 25.7* 23.2*  MCV 95.2 93.2  PLT 178 177      Assessment/Plan: Obstructive jaundice likely from liver mets from metastatic colon cancer - Renal insufficiency preventing contrasted MRCP but will order MRCP without contrast per Dr. Erlinda Hong recs. She states that she can hold her breath as required by radiology for the MRI. Needs cardiac clearance prior to an ERCP being done if mid to distal common bile duct obstruction seen on MRCP. Nurse aware of plan. Will f/u.   Lear Ng 09/27/2020, 8:54 AM  Questions please call [phone] ID: Annye Rusk, female   DOB: 1946-10-11, 74 y.o.   MRN: [id number]

## 2020-09-28 DIAGNOSIS — R17 Unspecified jaundice: Secondary | ICD-10-CM | POA: Diagnosis not present

## 2020-09-28 LAB — IRON AND TIBC
Iron: 65 ug/dL (ref 28–170)
Saturation Ratios: 50 % — ABNORMAL HIGH (ref 10.4–31.8)
TIBC: 130 ug/dL — ABNORMAL LOW (ref 250–450)
UIBC: 65 ug/dL

## 2020-09-28 LAB — GLUCOSE, CAPILLARY
Glucose-Capillary: 100 mg/dL — ABNORMAL HIGH (ref 70–99)
Glucose-Capillary: 89 mg/dL (ref 70–99)
Glucose-Capillary: 81 mg/dL (ref 70–99)
Glucose-Capillary: 111 mg/dL — ABNORMAL HIGH (ref 70–99)

## 2020-09-28 LAB — BASIC METABOLIC PANEL
Anion gap: 8 (ref 5–15)
BUN: 58 mg/dL — ABNORMAL HIGH (ref 8–23)
CO2: 19 mmol/L — ABNORMAL LOW (ref 22–32)
Calcium: 8 mg/dL — ABNORMAL LOW (ref 8.9–10.3)
Chloride: 105 mmol/L (ref 98–111)
Creatinine, Ser: 2.61 mg/dL — ABNORMAL HIGH (ref 0.44–1.00)
GFR, Estimated: 19 mL/min — ABNORMAL LOW (ref >60)
Glucose, Bld: 92 mg/dL (ref 70–99)
Potassium: 3.6 mmol/L (ref 3.5–5.1)
Sodium: 132 mmol/L — ABNORMAL LOW (ref 135–145)

## 2020-09-28 LAB — CBC WITH DIFFERENTIAL/PLATELET
Abs Immature Granulocytes: 0.14 10*3/uL — ABNORMAL HIGH (ref 0.00–0.07)
Basophils Absolute: 0 10*3/uL (ref 0.0–0.1)
Basophils Relative: 1 %
Eosinophils Absolute: 0.1 10*3/uL (ref 0.0–0.5)
Eosinophils Relative: 1 %
HCT: 22.7 % — ABNORMAL LOW (ref 36.0–46.0)
Hemoglobin: 7 g/dL — ABNORMAL LOW (ref 12.0–15.0)
Immature Granulocytes: 2 %
Lymphocytes Relative: 3 %
Lymphs Abs: 0.3 10*3/uL — ABNORMAL LOW (ref 0.7–4.0)
MCH: 28.8 pg (ref 26.0–34.0)
MCHC: 30.8 g/dL (ref 30.0–36.0)
MCV: 93.4 fL (ref 80.0–100.0)
Monocytes Absolute: 0.8 10*3/uL (ref 0.1–1.0)
Monocytes Relative: 11 %
Neutro Abs: 6.3 10*3/uL (ref 1.7–7.7)
Neutrophils Relative %: 82 %
Platelets: 219 10*3/uL (ref 150–400)
RBC: 2.43 MIL/uL — ABNORMAL LOW (ref 3.87–5.11)
RDW: 16.9 % — ABNORMAL HIGH (ref 11.5–15.5)
WBC: 7.7 10*3/uL (ref 4.0–10.5)
nRBC: 0 % (ref 0.0–0.2)

## 2020-09-28 LAB — HEPATIC FUNCTION PANEL
ALT: 57 U/L — ABNORMAL HIGH (ref 0–44)
AST: 57 U/L — ABNORMAL HIGH (ref 15–41)
Albumin: 1.7 g/dL — ABNORMAL LOW (ref 3.5–5.0)
Alkaline Phosphatase: 475 U/L — ABNORMAL HIGH (ref 38–126)
Bilirubin, Direct: 6.4 mg/dL — ABNORMAL HIGH (ref 0.0–0.2)
Indirect Bilirubin: 2.5 mg/dL — ABNORMAL HIGH (ref 0.3–0.9)
Total Bilirubin: 8.9 mg/dL — ABNORMAL HIGH (ref 0.3–1.2)
Total Protein: 4.4 g/dL — ABNORMAL LOW (ref 6.5–8.1)

## 2020-09-28 LAB — CREATININE, URINE, RANDOM: Creatinine, Urine: 28.69 mg/dL

## 2020-09-28 LAB — FERRITIN: Ferritin: 577 ng/mL — ABNORMAL HIGH (ref 11–307)

## 2020-09-28 LAB — BRAIN NATRIURETIC PEPTIDE: B Natriuretic Peptide: 480.1 pg/mL — ABNORMAL HIGH (ref 0.0–100.0)

## 2020-09-28 LAB — RESPIRATORY PANEL BY RT PCR (FLU A&B, COVID)
Influenza A by PCR: NEGATIVE
Influenza B by PCR: NEGATIVE
SARS Coronavirus 2 by RT PCR: NEGATIVE

## 2020-09-28 LAB — SODIUM, URINE, RANDOM: Sodium, Ur: 104 mmol/L

## 2020-09-28 MED ORDER — PHYTONADIONE 5 MG PO TABS
2.5000 mg | ORAL_TABLET | Freq: Once | ORAL | Status: AC
  Administered 2020-09-28: 2.5 mg via ORAL
  Filled 2020-09-28: qty 1

## 2020-09-28 NOTE — Progress Notes (Addendum)
Cassandra Allen   DOB:04-27-1946   HG#:[id number]   HDQ#:[id number]  Medical oncology followup note   Subjective: The patient reports that she is feeling well overall.  Hungry this morning.  Discussed with nursing who states that she is currently n.p.o. while awaiting further recommendations from specialist.  Denies abdominal pain, nausea, vomiting.  Objective:  Vitals:   09/27/20 2217 09/28/20 0504  BP: (!) 112/47 (!) 100/58  Pulse: 83 60  Resp: 18 20  Temp: 98.3 F (36.8 C) 98.3 F (36.8 C)  SpO2: 97% 96%    Body mass index is 29.03 kg/m.  Intake/Output Summary (Last 24 hours) at 09/28/2020 0942 Last data filed at 09/28/2020 0540 Gross per 24 hour  Intake --  Output 1550 ml  Net -1550 ml     Sclerae icteric  Oropharynx clear  No peripheral adenopathy  Lungs clear -- no rales or rhonchi  Heart regular rate and rhythm  Abdomen soft, nontender   MSK no focal spinal tenderness, no peripheral edema  Neuro nonfocal    CBG (last 3)  Recent Labs    09/27/20 1645 09/27/20 2215 09/28/20 0809  GLUCAP 89 100* 89     Labs:  Urine Studies No results for input(s): UHGB, CRYS in the last 72 hours.  Invalid input(s): UACOL, UAPR, USPG, UPH, UTP, UGL, UKET, UBIL, UNIT, UROB, [place], UEPI, UWBC, [place], [place]  Basic Metabolic Panel: Recent Labs  Lab 09/26/20 0714 09/26/20 0714 09/27/20 0516 09/28/20 0631  NA 137  --  133* 132*  K 3.2*   < > 3.4* 3.6  CL 106  --  106 105  CO2 19*  --  18* 19*  GLUCOSE 108*  --  126* 92  BUN 72*  --  62* 58*  CREATININE 2.71*  --  2.63* 2.61*  CALCIUM 8.0*  --  7.8* 8.0*  MG 1.5*  --  1.8  --   PHOS 4.4  --   --   --    < > = values in this interval not displayed.   GFR Estimated Creatinine Clearance: 17.8 mL/min (A) (by C-G formula based on SCr of 2.61 mg/dL (H)). Liver Function Tests: Recent Labs  Lab 09/26/20 0714 09/27/20 0516  AST 73* 71*  ALT 69* 65*  ALKPHOS 625* 566*  BILITOT 9.8* 9.7*  PROT  4.9* 4.5*  ALBUMIN 2.0* 1.7*   Recent Labs  Lab 09/26/20 0714  LIPASE 25   Recent Labs  Lab 09/26/20 0714  AMMONIA 29   Coagulation profile Recent Labs  Lab 09/26/20 0714  INR 1.9*    CBC: Recent Labs  Lab 09/26/20 0714 09/27/20 0516 09/28/20 0631  WBC 7.3 9.2 7.7  NEUTROABS  --  7.9* 6.3  HGB 7.8* 7.2* 7.0*  HCT 25.7* 23.2* 22.7*  MCV 95.2 93.2 93.4  PLT 178 177 219   Cardiac Enzymes: No results for input(s): CKTOTAL, CKMB, CKMBINDEX, TROPONINI in the last 168 hours. BNP: Invalid input(s): POCBNP CBG: Recent Labs  Lab 09/27/20 0747 09/27/20 1200 09/27/20 1645 09/27/20 2215 09/28/20 0809  GLUCAP 101* 97 89 100* 89   D-Dimer No results for input(s): DDIMER in the last 72 hours. Hgb A1c No results for input(s): HGBA1C in the last 72 hours. Lipid Profile No results for input(s): CHOL, HDL, LDLCALC, TRIG, CHOLHDL, LDLDIRECT in the last 72 hours. Thyroid function studies No results for input(s): TSH, T4TOTAL, T3FREE, THYROIDAB in the last 72 hours.  Invalid input(s): FREET3 Anemia work up [employer]  09/28/20 0631  FERRITIN 577*  TIBC 130*  IRON 65   Microbiology Recent Results (from the past 240 hour(s))  Culture, Urine     Status: Abnormal   Collection Time: 10/01/2020  1:07 AM   Specimen: Urine, Clean Catch  Result Value Ref Range Status   Specimen Description   Final    URINE, CLEAN CATCH Performed at [facility], [place] [address], [place], [place] [address]    Special Requests   Final    NONE Performed at [facility], [place] [address] [place], [place] [address]    Culture MULTIPLE SPECIES PRESENT, SUGGEST RECOLLECTION (A)  Final   Report Status 09/27/2020 FINAL  Final      Studies:  US RENAL  Result Date: 01-Oct-2020 CLINICAL DATA:  Elevated creatinine. EXAM: RENAL / URINARY TRACT ULTRASOUND COMPLETE COMPARISON:  Abdominal CT June 04, 2019, abdominal MRI August 05, 2020 FINDINGS: Right  Kidney: Renal measurements: 11.0 x 6.5 x 7.0 = volume: 268 mL. Echogenicity within normal limits. No mass or hydronephrosis visualized. Left Kidney: Renal measurements: 11.0 x 6.2 x 6.8 cm = volume: 240 mL. Echogenicity within normal limits. No mass or hydronephrosis visualized. Bladder: Appears normal for degree of bladder distention. Other: Incidental finding of layering sludge and small gallstones within the neck of the gallbladder. There is no evidence of gallbladder wall thickening. IMPRESSION: 1. Normal appearance of the kidneys and urinary bladder. 2. Cholelithiasis and gallbladder sludge without evidence of acute cholecystitis. Electronically Signed   By: Fidela Salisbury M.D.   On: 01-Oct-2020 13:56   MR ABDOMEN MRCP WO CONTRAST  Result Date: 09/27/2020 CLINICAL DATA:  Jaundice.  Metastatic colon cancer with liver mets. EXAM: MRI ABDOMEN WITHOUT CONTRAST  (INCLUDING MRCP) TECHNIQUE: Multiplanar multisequence MR imaging of the abdomen was performed. Heavily T2-weighted images of the biliary and pancreatic ducts were obtained, and three-dimensional MRCP images were rendered by post processing. COMPARISON:  08/05/2020 FINDINGS: Lower chest: Small bilateral pleural effusions with bibasilar collapse/consolidation, right greater than left. Hepatobiliary: Intrahepatic biliary duct dilatation is markedly progressed since 08/05/2020. Biliary dilatation is symmetric in the right and left hepatic lobes and both the right and left ductal anatomy becomes obliterated in the region of the ductal confluence at the porta hepatis. Common bile duct reconstitutes in the head of pancreas, nondilated at 6 mm diameter. The area of ductal termination does not appear to be immediately adjacent to the known caudate lesion. Assessment of the region is somewhat limited by motion artifact and lack of intravenous contrast on today's exam. There is some amorphous abnormal T1 signal in the porta hepatis which may represent  metastatic spread. Gallbladder is markedly distended with layering tiny stones. Pancreas: No focal mass lesion. No dilatation of the main duct. No intraparenchymal cyst. No peripancreatic edema. Spleen: Scattered tiny T2 hyperintense foci in the splenic parenchyma are stable, likely benign. Adrenals/Urinary Tract: Right adrenal gland unremarkable. Left adrenal mass measures 3.0 x 2.7 cm today compared to 3.2 x 3.0 cm previously, not substantially changed. Stable tiny T2 hyperintensity in the interpolar right kidney, likely a cyst. Left kidney unremarkable. Stomach/Bowel: Stomach is unremarkable. No gastric wall thickening. No evidence of outlet obstruction. Duodenum is normally positioned as is the ligament of Treitz. No small bowel or colonic dilatation within the visualized abdomen. Vascular/Lymphatic: No abdominal aortic aneurysm. There is persistent flow signal in the portal vein although portal vein appears attenuated in the hepatoduodenal ligament. Flow signal persists in the superior mesenteric vein and splenic  vein. Other: Perihepatic ascites again noted with fluid in the paracolic gutter bilaterally, right greater than left. Body wall edema evident. Musculoskeletal: No overtly suspicious marrow signal abnormality within the visualized bony anatomy. IMPRESSION: 1. Markedly progressed intrahepatic biliary duct dilatation since 08/05/2020. Both the left and right hepatic ducts become obliterated in the region of the biliary confluence near the porta hepatis. There is some amorphous abnormal T1 signal in the porta hepatis which may represent metastatic involvement although a discrete obstructing mass lesion cannot be discerned. Common bile duct in the head of pancreas is nondilated. 2. Persistent flow signal in the portal vein although portal vein appears attenuated in the hepatoduodenal ligament. 3. Cholelithiasis. Prominent gallbladder distension. Similar to 08/05/2020. 4. Similar appearance of the left  adrenal mass measuring 3.0 x 2.7 cm today compared to 3.0 x 3.2 cm previously. 5. No substantial change in small volume ascites. 6. Small bilateral pleural effusions with bibasilar collapse/consolidation, right greater than left. Electronically Signed   By: Misty Stanley M.D.   On: 09/27/2020 11:01   MR 3D Recon At Scanner  Result Date: 09/27/2020 CLINICAL DATA:  Jaundice.  Metastatic colon cancer with liver mets. EXAM: MRI ABDOMEN WITHOUT CONTRAST  (INCLUDING MRCP) TECHNIQUE: Multiplanar multisequence MR imaging of the abdomen was performed. Heavily T2-weighted images of the biliary and pancreatic ducts were obtained, and three-dimensional MRCP images were rendered by post processing. COMPARISON:  08/05/2020 FINDINGS: Lower chest: Small bilateral pleural effusions with bibasilar collapse/consolidation, right greater than left. Hepatobiliary: Intrahepatic biliary duct dilatation is markedly progressed since 08/05/2020. Biliary dilatation is symmetric in the right and left hepatic lobes and both the right and left ductal anatomy becomes obliterated in the region of the ductal confluence at the porta hepatis. Common bile duct reconstitutes in the head of pancreas, nondilated at 6 mm diameter. The area of ductal termination does not appear to be immediately adjacent to the known caudate lesion. Assessment of the region is somewhat limited by motion artifact and lack of intravenous contrast on today's exam. There is some amorphous abnormal T1 signal in the porta hepatis which may represent metastatic spread. Gallbladder is markedly distended with layering tiny stones. Pancreas: No focal mass lesion. No dilatation of the main duct. No intraparenchymal cyst. No peripancreatic edema. Spleen: Scattered tiny T2 hyperintense foci in the splenic parenchyma are stable, likely benign. Adrenals/Urinary Tract: Right adrenal gland unremarkable. Left adrenal mass measures 3.0 x 2.7 cm today compared to 3.2 x 3.0 cm previously,  not substantially changed. Stable tiny T2 hyperintensity in the interpolar right kidney, likely a cyst. Left kidney unremarkable. Stomach/Bowel: Stomach is unremarkable. No gastric wall thickening. No evidence of outlet obstruction. Duodenum is normally positioned as is the ligament of Treitz. No small bowel or colonic dilatation within the visualized abdomen. Vascular/Lymphatic: No abdominal aortic aneurysm. There is persistent flow signal in the portal vein although portal vein appears attenuated in the hepatoduodenal ligament. Flow signal persists in the superior mesenteric vein and splenic vein. Other: Perihepatic ascites again noted with fluid in the paracolic gutter bilaterally, right greater than left. Body wall edema evident. Musculoskeletal: No overtly suspicious marrow signal abnormality within the visualized bony anatomy. IMPRESSION: 1. Markedly progressed intrahepatic biliary duct dilatation since 08/05/2020. Both the left and right hepatic ducts become obliterated in the region of the biliary confluence near the porta hepatis. There is some amorphous abnormal T1 signal in the porta hepatis which may represent metastatic involvement although a discrete obstructing mass lesion cannot be discerned. Common bile duct  in the head of pancreas is nondilated. 2. Persistent flow signal in the portal vein although portal vein appears attenuated in the hepatoduodenal ligament. 3. Cholelithiasis. Prominent gallbladder distension. Similar to 08/05/2020. 4. Similar appearance of the left adrenal mass measuring 3.0 x 2.7 cm today compared to 3.0 x 3.2 cm previously. 5. No substantial change in small volume ascites. 6. Small bilateral pleural effusions with bibasilar collapse/consolidation, right greater than left. Electronically Signed   By: Misty Stanley M.D.   On: 09/27/2020 11:01    Assessment: 74 y.o. with left side colon cancer and presumed oligo liver metastasis, s/p chemo and left hemicolectomy on 12/27/2018  due to bowel obstruction   1.  Obstructive jaundice from liver metastasis 2.  Metastatic colon cancer to liver, s/p SBRT  3. HTN 4. T2DM 5. CHF, AF 6. Acute on chronic CKD   Plan:  -MRCP was reviewed which showed markedly progressive intrahepatic biliary duct dilatation with both the left and right hepatic ducts obliterated in the region of the biliary confluence near porta hepatis.  GI is following and considering for possible ERCP.  They request cardiac clearance prior to procedure.  Awaiting cardiac clearance and further plan from GI. -pt is not sure if she wants PTC if ERCP and stent is not feasible. Pt has been overall very reluctant to do aggressive treatment, and has limited PS and social support. If ERCP is not feasible or stent placement not successful, I will discuss palliative care and hospice with her. I highly doubt she is a candidate for Whipple surgery, and chemotherapy will be difficult for her to take. She would not be a candidate for chemo if her liver function does not improve. -Liver function was not checked this morning.  I have added on a hepatic function panel to her labs already drawn earlier today.   Mikey Bussing, NP 09/28/2020  9:42 AM   Addendum  I have seen the patient, examined her. I agree with the assessment and and plan and have edited the notes.   Pt underwent ERCP and stent placement by Dr. Watt Climes successfully today. She was difficult to awake after procedure, and was admitted to ICU.   I will f/u tomorrow or Friday to see what she would like to do next, chemo (if she is able to recover well) vs palliative care and hospice. I do not think she is a good candidate for chemo even if her jaundice resolves, due to her poor overall health and limited social support, but I am not sure if she is ready for hospice.   Truitt Merle  09/29/2020

## 2020-09-28 NOTE — Progress Notes (Signed)
Physical Therapy Treatment Patient Details Name: Cassandra Allen MRN: [id number] DOB: [birth year] Today's Date: 09/28/2020    History of Present Illness 74 y.o. with left side colon cancer and presumed oligo liver metastasis, s/p chemo and left hemicolectomy due to bowel obstruction on 12/27/2018. Patient presented to OSH with jaundice for 1 week, with no significant pain. CT revealed biliary dutal dilation related to liver mets, patient was then transferred to [facility] for management.    PT Comments    Pt declined OOB activity this afternoon. She did agree to bed level exercises. Will continue to follow and progress activity as tolerated.    Follow Up Recommendations  Home health PT;Supervision/Assistance - 24 hour     Equipment Recommendations  None recommended by PT    Recommendations for Other Services       Precautions / Restrictions Precautions Precautions: Fall Restrictions Weight Bearing Restrictions: No    Mobility  Bed Mobility               General bed mobility comments: NT-pt declined OOB  Transfers                    Ambulation/Gait                 Stairs             Wheelchair Mobility    Modified Rankin (Stroke Patients Only)       Balance                                            Cognition Arousal/Alertness: Awake/alert Behavior During Therapy: WFL for tasks assessed/performed Overall Cognitive Status: Impaired/Different from baseline Area of Impairment: Problem solving;Safety/judgement;Memory                     Memory: Decreased short-term memory   Safety/Judgement: Decreased awareness of deficits   Problem Solving: Decreased initiation;Requires verbal cues General Comments: pt's son reports changes in memory and cognition, pt reporting greater independence with mobility and ADL's than her recent requirements.      Exercises General Exercises - Lower Extremity Ankle  Circles/Pumps: AROM;Both;15 reps;Supine Quad Sets: AROM;Both;15 reps;Supine Heel Slides: Both;15 reps;Supine;AROM Hip ABduction/ADduction: AROM;Both;15 reps;Supine    General Comments        Pertinent Vitals/Pain Pain Assessment: No/denies pain    Home Living                      Prior Function            PT Goals (current goals can now be found in the care plan section) Progress towards PT goals: Progressing toward goals    Frequency    Min 3X/week      PT Plan Current plan remains appropriate    Co-evaluation              AM-PAC PT "6 Clicks" Mobility   Outcome Measure  Help needed turning from your back to your side while in a flat bed without using bedrails?: A Lot Help needed moving from lying on your back to sitting on the side of a flat bed without using bedrails?: A Lot Help needed moving to and from a bed to a chair (including a wheelchair)?: A Lot Help needed standing up from a chair using your arms (e.g., wheelchair or bedside chair)?: A  Lot Help needed to walk in hospital room?: A Lot Help needed climbing 3-5 steps with a railing? : A Lot 6 Click Score: 12    End of Session Equipment Utilized During Treatment: Gait belt Activity Tolerance: Patient tolerated treatment well Patient left: in bed;with call bell/phone within reach;with bed alarm set   PT Visit Diagnosis: Muscle weakness (generalized) (M62.81);Difficulty in walking, not elsewhere classified (R26.2)     Time: 0300-9233 PT Time Calculation (min) (ACUTE ONLY): 8 min  Charges:  $Therapeutic Exercise: 8-22 mins                         Doreatha Massed, PT Acute Rehabilitation  Office: [phone] Pager: [phone]

## 2020-09-28 NOTE — Progress Notes (Signed)
PROGRESS NOTE    Cassandra Allen  JIR:[id number] DOB: May 24, 1946 DOA: 09/25/2020 PCP: No primary care provider on file.   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 fib, diastolic congestive heart failure who presented to the emergency department with complaints of abdominal pain, nausea, vomiting, increased jaundice, loss of appetite.  She was recently admitted and discharged at this hospital for biliary obstruction secondary to hepatic metastasis compressing the biliary duct.  On presentation, elevated bilirubin in the range of 13.  CT imaging showed worsening biliary dilation worsening metastasis.  She was also found to have severe AKI with creatinine in the range of 2.7, elevated alkaline phosphatase.  Oncology, GI consulted.  Went MRCP during this hospitalization.  Plan for ERCP tomorrow.  Assessment & Plan:   Principal Problem:   Jaundice Active Problems:   Essential (primary) hypertension   Cancer of splenic flexure of colon   HLD (hyperlipidemia)   Atrial fibrillation, chronic (HCC)   CKD (chronic kidney disease) stage 3, GFR 30-59 ml/min (HCC)   Right upper quadrant pain/obstructive jaundice: She has history of metastatic colon cancer to liver/adrenal  gland.  She is a status post colostomy.  She has been started on Zosyn for the suspicion of cholangitis which has been D/Ced now.   Currently on IV fluids, continue antiemetics, analgesics. Underwent MRCP which  showed markedly progressed intrahepatic biliary duct dilatation since 08/05/2020 with suspicion for metastatic obstructive process. Continue ursodiol.  She has elevated liver enzymes.  Plan for ERCP tomorrow by GI.  AKI : Continue gentle IV fluids.  Renal ultrasound did not show any obstruction.  Creatinine on 09/02/2020 was 0.7.  Will check urine  sodium and creatinine.  CT has shown infiltration of the cancer into the adrenal gland and possibly kidneys which could have played a role on this.  Acute metabolic encephalopathy: Resolved, currently alert and awake.  She has memory impairment at baseline  Normocytic anemia: Hemoglobin in the range of 7.  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  Antihypertensives on hold.  Chronic A. fib: On metoprolol for rate control at home.  Not on anticoagulation.  Chronic diastolic congestive heart failure: Appeared dehydrated on presentation.  IV fluids to be continued for now. Will check BNP   Insulin-dependent diabetes mellitus: Continue current insulin .  Continue to monitor blood sugars  Failure to thrive/deconditioning/debility: Uses walker for ambulation at home.  PT consulted and recommended HH  Goals of care: Very deconditioned/debilitated female with metastatic colon cancer.  Now has developed obstructive jaundice,AKI.  Remains very weak.  She has very poor quality of life.  We recommend palliative approach/hospice.  I discussed with the son and gave the clear picture of her poor prognosis.  He is interested to know about hospice at home.  I have consulted palliative care for assistance     DVT prophylaxis: Heparin Jay Code Status: Full code Family Communication: Called and discussed with the son on phone on 09/28/2020 Status is: Inpatient  Remains inpatient appropriate because:Inpatient level of care appropriate due to severity of illness   Dispo: The patient is from: [facility]              Anticipated d/c is to:  Same vs home with hospice              Anticipated d/c date is: 2 days              Patient currently is not medically stable to d/c.     Consultants:  Gi  Procedures:None  Antimicrobials:  Anti-infectives (From admission, onward)   Start     Dose/Rate Route Frequency Ordered Stop   09/27/20 0200  piperacillin-tazobactam (ZOSYN) IVPB 2.25 g  Status:  Discontinued        2.25 g 100 mL/hr over 30 Minutes Intravenous Every 8 hours 09/26/20 2000 09/27/20 1620   09/26/20 0200  piperacillin-tazobactam (ZOSYN) IVPB 3.375 g  Status:  Discontinued        3.375 g 12.5 mL/hr over 240 Minutes Intravenous Every 8 hours 09/25/20 2356 09/26/20 1959      Subjective:   Patient seen and examined at the bedside this morning.  Weak, lying in the bed but alert and oriented.  Denies any complaints of nausea or vomiting. Eating food during my evaluation and looked comfortable  Objective: Vitals:   09/27/20 1719 09/27/20 2217 09/28/20 0504 09/28/20 1327  BP:  (!) 112/47 (!) 100/58 (!) 111/45  Pulse:  83 60 67  Resp:  18 20 18   Temp:  98.3 F (36.8 C) 98.3 F (36.8 C) 98.1 F (36.7 C)  TempSrc:  Oral Oral Oral  SpO2:  97% 96% 96%  Weight:      Height: 5\' 2"  (1.575 m)       Intake/Output Summary (Last 24 hours) at 09/28/2020 1447 Last data filed at 09/28/2020 0540 Gross per 24 hour  Intake --  Output 1550 ml  Net -1550 ml   Filed Weights   09/25/20 2100 09/26/20 0702 09/27/20 0449  Weight: 66.6 kg 68.6 kg 72 kg    Examination:  General exam: Icteric, chronically ill looking Respiratory system: No wheezes or crackles Cardiovascular system: S1 & S2 heard, RRR. No JVD, murmurs, rubs, gallops or clicks.  Chemo-Port on the right chest Gastrointestinal system: Abdomen is distended, soft and nontender. No organomegaly or masses felt. Normal bowel sounds heard. Central nervous system: Alert and oriented. No focal neurological deficits. Extremities: No edema, no clubbing ,no cyanosis Skin: Icterus   Data Reviewed: I have personally reviewed following labs and imaging studies  CBC: Recent Labs  Lab 09/26/20 0714 09/27/20 0516  09/28/20 0631  WBC 7.3 9.2 7.7  NEUTROABS  --  7.9* 6.3  HGB 7.8* 7.2* 7.0*  HCT 25.7* 23.2* 22.7*  MCV 95.2 93.2 93.4  PLT 178 177 902   Basic Metabolic Panel: Recent Labs  Lab 09/26/20 0714 09/27/20 0516 09/28/20 0631  NA 137 133* 132*  K 3.2* 3.4* 3.6  CL 106 106 105  CO2 19* 18* 19*  GLUCOSE 108* 126* 92  BUN 72* 62* 58*  CREATININE 2.71* 2.63* 2.61*  CALCIUM 8.0* 7.8* 8.0*  MG 1.5* 1.8  --   PHOS 4.4  --   --    GFR: Estimated Creatinine Clearance: 17.8 mL/min (A) (by C-G formula based on SCr of 2.61 mg/dL (H)). Liver Function Tests: Recent Labs  Lab 09/26/20 0714 09/27/20 0516 09/28/20 0631  AST 73* 71* 57*  ALT 69* 65* 57*  ALKPHOS 625* 566* 475*  BILITOT 9.8* 9.7* 8.9*  PROT 4.9* 4.5* 4.4*  ALBUMIN 2.0* 1.7* 1.7*   Recent Labs  Lab 09/26/20 0714  LIPASE 25   Recent Labs  Lab 09/26/20 0714  AMMONIA 29   Coagulation Profile: Recent Labs  Lab 09/26/20 0714  INR 1.9*   Cardiac Enzymes: No results for input(s): CKTOTAL, CKMB, CKMBINDEX, TROPONINI in the last 168 hours. BNP (last 3 results) No results for input(s): PROBNP in the last 8760 hours. HbA1C: No results for input(s): HGBA1C in the last 72 hours. CBG: Recent Labs  Lab 09/27/20 1200 09/27/20 1645 09/27/20 2215 09/28/20 0809 09/28/20 1158  GLUCAP 97 89 100* 89 81   Lipid Profile: No results for input(s): CHOL, HDL, LDLCALC, TRIG, CHOLHDL, LDLDIRECT in the last 72 hours. Thyroid Function Tests: No results for input(s): TSH, T4TOTAL, FREET4, T3FREE, THYROIDAB in the last 72 hours. Anemia Panel: Recent Labs    09/28/20 0631  FERRITIN 577*  TIBC 130*  IRON 65   Sepsis Labs: Recent Labs  Lab 09/26/20 0714  LATICACIDVEN 0.6    Recent Results (from the past 240 hour(s))  Culture, Urine     Status: Abnormal   Collection Time: 09/26/20  1:07 AM   Specimen: Urine, Clean Catch  Result Value Ref Range Status   Specimen Description   Final    URINE, CLEAN CATCH Performed  at [facility], [place] [address], [place], [place] [address]    Special Requests   Final    NONE Performed at [facility], [place] [address] [place],  [address]    Culture MULTIPLE SPECIES PRESENT, SUGGEST RECOLLECTION (A)  Final   Report Status 09/27/2020 FINAL  Final         Radiology Studies: MR ABDOMEN MRCP WO CONTRAST  Result Date: 09/27/2020 CLINICAL DATA:  Jaundice.  Metastatic colon cancer with liver mets. EXAM: MRI ABDOMEN WITHOUT CONTRAST  (INCLUDING MRCP) TECHNIQUE: Multiplanar multisequence MR imaging of the abdomen was performed. Heavily T2-weighted images of the biliary and pancreatic ducts were obtained, and three-dimensional MRCP images were rendered by post processing. COMPARISON:  08/05/2020 FINDINGS: Lower chest: Small bilateral pleural effusions with bibasilar collapse/consolidation, right greater than left. Hepatobiliary: Intrahepatic biliary duct dilatation is markedly progressed since 08/05/2020. Biliary dilatation is symmetric in the right and left hepatic lobes and both the right and left ductal anatomy becomes obliterated in the region of the ductal confluence at the porta hepatis. Common bile duct reconstitutes in the head of pancreas, nondilated at 6 mm diameter. The area of ductal termination does not appear to be immediately adjacent to the known caudate lesion. Assessment of the region is somewhat limited by motion artifact and lack of intravenous contrast on today's exam. There is some amorphous abnormal T1 signal in the porta hepatis which may represent metastatic spread. Gallbladder is markedly distended with layering tiny stones. Pancreas: No focal mass lesion. No dilatation of the main duct. No intraparenchymal cyst. No peripancreatic edema. Spleen: Scattered tiny T2 hyperintense foci in the splenic parenchyma are stable, likely benign. Adrenals/Urinary Tract: Right adrenal gland unremarkable. Left adrenal mass measures  3.0 x 2.7 cm today compared to 3.2 x 3.0 cm previously, not substantially changed. Stable tiny T2 hyperintensity in the interpolar right kidney, likely a cyst. Left kidney unremarkable. Stomach/Bowel: Stomach is unremarkable. No gastric wall thickening. No evidence of outlet obstruction. Duodenum is normally positioned as is the ligament of Treitz. No small bowel or colonic dilatation within the visualized abdomen. Vascular/Lymphatic: No abdominal aortic aneurysm. There is persistent flow signal in the portal vein although portal vein appears attenuated in the hepatoduodenal ligament. Flow signal persists in the superior mesenteric vein and splenic vein. Other: Perihepatic ascites again  noted with fluid in the paracolic gutter bilaterally, right greater than left. Body wall edema evident. Musculoskeletal: No overtly suspicious marrow signal abnormality within the visualized bony anatomy. IMPRESSION: 1. Markedly progressed intrahepatic biliary duct dilatation since 08/05/2020. Both the left and right hepatic ducts become obliterated in the region of the biliary confluence near the porta hepatis. There is some amorphous abnormal T1 signal in the porta hepatis which may represent metastatic involvement although a discrete obstructing mass lesion cannot be discerned. Common bile duct in the head of pancreas is nondilated. 2. Persistent flow signal in the portal vein although portal vein appears attenuated in the hepatoduodenal ligament. 3. Cholelithiasis. Prominent gallbladder distension. Similar to 08/05/2020. 4. Similar appearance of the left adrenal mass measuring 3.0 x 2.7 cm today compared to 3.0 x 3.2 cm previously. 5. No substantial change in small volume ascites. 6. Small bilateral pleural effusions with bibasilar collapse/consolidation, right greater than left. Electronically Signed   By: Misty Stanley M.D.   On: 09/27/2020 11:01   MR 3D Recon At Scanner  Result Date: 09/27/2020 CLINICAL DATA:  Jaundice.   Metastatic colon cancer with liver mets. EXAM: MRI ABDOMEN WITHOUT CONTRAST  (INCLUDING MRCP) TECHNIQUE: Multiplanar multisequence MR imaging of the abdomen was performed. Heavily T2-weighted images of the biliary and pancreatic ducts were obtained, and three-dimensional MRCP images were rendered by post processing. COMPARISON:  08/05/2020 FINDINGS: Lower chest: Small bilateral pleural effusions with bibasilar collapse/consolidation, right greater than left. Hepatobiliary: Intrahepatic biliary duct dilatation is markedly progressed since 08/05/2020. Biliary dilatation is symmetric in the right and left hepatic lobes and both the right and left ductal anatomy becomes obliterated in the region of the ductal confluence at the porta hepatis. Common bile duct reconstitutes in the head of pancreas, nondilated at 6 mm diameter. The area of ductal termination does not appear to be immediately adjacent to the known caudate lesion. Assessment of the region is somewhat limited by motion artifact and lack of intravenous contrast on today's exam. There is some amorphous abnormal T1 signal in the porta hepatis which may represent metastatic spread. Gallbladder is markedly distended with layering tiny stones. Pancreas: No focal mass lesion. No dilatation of the main duct. No intraparenchymal cyst. No peripancreatic edema. Spleen: Scattered tiny T2 hyperintense foci in the splenic parenchyma are stable, likely benign. Adrenals/Urinary Tract: Right adrenal gland unremarkable. Left adrenal mass measures 3.0 x 2.7 cm today compared to 3.2 x 3.0 cm previously, not substantially changed. Stable tiny T2 hyperintensity in the interpolar right kidney, likely a cyst. Left kidney unremarkable. Stomach/Bowel: Stomach is unremarkable. No gastric wall thickening. No evidence of outlet obstruction. Duodenum is normally positioned as is the ligament of Treitz. No small bowel or colonic dilatation within the visualized abdomen. Vascular/Lymphatic:  No abdominal aortic aneurysm. There is persistent flow signal in the portal vein although portal vein appears attenuated in the hepatoduodenal ligament. Flow signal persists in the superior mesenteric vein and splenic vein. Other: Perihepatic ascites again noted with fluid in the paracolic gutter bilaterally, right greater than left. Body wall edema evident. Musculoskeletal: No overtly suspicious marrow signal abnormality within the visualized bony anatomy. IMPRESSION: 1. Markedly progressed intrahepatic biliary duct dilatation since 08/05/2020. Both the left and right hepatic ducts become obliterated in the region of the biliary confluence near the porta hepatis. There is some amorphous abnormal T1 signal in the porta hepatis which may represent metastatic involvement although a discrete obstructing mass lesion cannot be discerned. Common bile duct in the head of pancreas  is nondilated. 2. Persistent flow signal in the portal vein although portal vein appears attenuated in the hepatoduodenal ligament. 3. Cholelithiasis. Prominent gallbladder distension. Similar to 08/05/2020. 4. Similar appearance of the left adrenal mass measuring 3.0 x 2.7 cm today compared to 3.0 x 3.2 cm previously. 5. No substantial change in small volume ascites. 6. Small bilateral pleural effusions with bibasilar collapse/consolidation, right greater than left. Electronically Signed   By: Misty Stanley M.D.   On: 09/27/2020 11:01        Scheduled Meds: . Chlorhexidine Gluconate Cloth  6 each Topical Daily  . heparin injection (subcutaneous)  5,000 Units Subcutaneous Q8H  . insulin aspart  0-9 Units Subcutaneous TID WC  . insulin detemir  16 Units Subcutaneous Daily  . metoprolol tartrate  12.5 mg Oral BID  . oxybutynin  20 mg Oral Daily  . pantoprazole  40 mg Oral Daily  . sodium chloride flush  3 mL Intravenous Q12H  . ursodiol  300 mg Oral BID   Continuous Infusions: . lactated ringers 100 mL/hr at 09/28/20 0837      LOS: 3 days    Time spent: 35 mins.More than 50% of that time was spent in counseling and/or coordination of care.      Shelly Coss, MD [facility] P11/16/2021, 2:47 PM

## 2020-09-28 NOTE — Progress Notes (Signed)
Lengthy discussion with the patient regarding ERCP versus PTC for drainage of her bile duct.    I went through the pathophysiology of what is currently happening, and reviewed in detail the nature, purpose, and risks of ERCP (including anesthesia, 2 to 3% risk of pancreatitis which can occasionally be fatal, infection, bleeding, perforation).  I also briefly described about percutaneous drainage.    In my opinion, it is better to start with ERCP but if that is unsuccessful, PTC could then be used as a fallback option.  The patient does seem desirous of having something done and is agreeable to proceed with ERCP.  I am in the process of contacting Dr. Clarene Essex of our office to see when he might be available for the procedure, which will hopefully be in the next several days.  On exam, the patient is lying in bed in no distress and seems cognitively very intact.  At this time, the cardiac exam is unremarkable, the chest is clear.  The abdomen is somewhat adipose and firm, and moderately tender to palpation.  I have ordered a dose of oral vitamin K for the patient, with the hope that it will help to correct her INR in anticipation of the procedure.  Dr. Paulita Fujita, who did the patient's initial consultation, indicated that Dr. Fransico Him of cardiology does not feel that the patient needs a formal cardiac consultation prior to ERCP in view of uncomplicated atrial fibrillation.  The patient is currently just on subcu heparin, not full anticoagulation, and she was not on anticoagulation prior to admission.  Time for today's visit, including care coordination, patient education, record review, examination, and dictation, was approximately 40 minutes.  Cleotis Nipper, M.D. Pager [phone] If no answer or after 5 PM call [phone]

## 2020-09-28 NOTE — Anesthesia Preprocedure Evaluation (Addendum)
Anesthesia Evaluation  Patient identified by MRN, date of birth, ID band Patient awake    Reviewed: Allergy & Precautions, NPO status , Patient's Chart, lab work & pertinent test results  History of Anesthesia Complications Negative for: history of anesthetic complications  Airway Mallampati: I  TM Distance: >3 FB Neck ROM: Full    Dental  (+) Poor Dentition, Missing, Dental Advisory Given   Pulmonary neg pulmonary ROS,    Pulmonary exam normal        Cardiovascular hypertension, Pt. on medications and Pt. on home beta blockers Normal cardiovascular exam+ dysrhythmias (on diltiazem gtt) Atrial Fibrillation   IMPRESSIONS    1. The left ventricle has normal systolic function of 45-62%. The cavity  size was normal. There is no increased left ventricular wall thickness.  Left ventricular diastology could not be evaluated secondary to atrial  fibrillation.  2. The right ventricle has normal systolic function. The cavity was  normal. There is no increase in right ventricular wall thickness.  3. Left atrial size was mildly dilated.  4. Right atrial size was mildly dilated.  5. The mitral valve is normal in structure. There is mild thickening and  mild calcification.  6. The tricuspid valve is normal in structure.  7. The aortic valve is tricuspid There is mild thickening and mild  calcification of the aortic valve.  8. The pulmonic valve was normal in structure.    Neuro/Psych PSYCHIATRIC DISORDERS Depression negative neurological ROS     GI/Hepatic Neg liver ROS, Colon CA with Liver mets   Endo/Other  diabetes, Type 2  Renal/GU Renal InsufficiencyRenal disease     Musculoskeletal   Abdominal   Peds  Hematology  (+) anemia ,   Anesthesia Other Findings   Reproductive/Obstetrics                            Lab Results  Component Value Date   WBC 7.7 09/28/2020   HGB 7.0 (L)  09/28/2020   HCT 22.7 (L) 09/28/2020   MCV 93.4 09/28/2020   PLT 219 09/28/2020   Lab Results  Component Value Date   CREATININE 2.61 (H) 09/28/2020   BUN 58 (H) 09/28/2020   NA 132 (L) 09/28/2020   K 3.6 09/28/2020   CL 105 09/28/2020   CO2 19 (L) 09/28/2020    Anesthesia Physical  Anesthesia Plan  ASA: IV  Anesthesia Plan: General   Post-op Pain Management:    Induction:   PONV Risk Score and Plan: 3 and Dexamethasone, Ondansetron and Treatment may vary due to age or medical condition  Airway Management Planned: Oral ETT  Additional Equipment:   Intra-op Plan:   Post-operative Plan: Extubation in OR  Informed Consent: I have reviewed the patients History and Physical, chart, labs and discussed the procedure including the risks, benefits and alternatives for the proposed anesthesia with the patient or authorized representative who has indicated his/her understanding and acceptance.     Dental advisory given  Plan Discussed with: Anesthesiologist and CRNA  Anesthesia Plan Comments:        Anesthesia Quick Evaluation

## 2020-09-28 NOTE — TOC Initial Note (Signed)
Transition of Care [facility]) - Initial/Assessment Note    Patient Details  Name: Cassandra Allen MRN: [id number] Date of Birth: [birth year]  Transition of Care [facility]) CM/SW Contact:    Lynnell Catalan, RN Phone Number: 09/28/2020, 2:29 PM  Clinical Narrative:                 Spoke with [name] [phone] from [facility] ([place] in [place]). [name] states that they manage her health care in the community and can provide transportation services to pt when she is discharged. H&P faxed to [facility] per her request [phone]. TOC will continue to follow.  Expected Discharge Plan: [place] Barriers to Discharge: Continued Medical Work up   Expected Discharge Plan and Services Expected Discharge Plan: [place]   Discharge Planning Services: CM Consult   Living arrangements for the past 2 months: Single Family Home                   Prior Living Arrangements/Services Living arrangements for the past 2 months: Single Family Home Lives with:: Adult Children Patient language and need for interpreter reviewed:: Yes        Need for Family Participation in Patient Care: Yes (Comment) Care giver support system in place?: Yes (comment)   Criminal Activity/Legal Involvement Pertinent to Current Situation/Hospitalization: No - Comment as needed  Activities of Daily Living Home Assistive Devices/Equipment: Ostomy supplies, CBG Meter, Walker (specify type) ADL Screening (condition at time of admission) Patient's cognitive ability adequate to safely complete daily activities?: Yes Is the patient deaf or have difficulty hearing?: No Does the patient have difficulty seeing, even when wearing glasses/contacts?: No Does the patient have difficulty concentrating, remembering, or making decisions?: Yes Patient able to express need for assistance with ADLs?: Yes Does the patient have difficulty dressing or bathing?: Yes Independently  performs ADLs?: No Communication: Independent Dressing (OT): Needs assistance Is this a change from baseline?: Pre-admission baseline Grooming: Independent Feeding: Independent Bathing: Needs assistance Is this a change from baseline?: Pre-admission baseline Toileting: Independent with device (comment) In/Out Bed: Independent with device (comment) Walks in Home: Independent with device (comment) Does the patient have difficulty walking or climbing stairs?: Yes Weakness of Legs: Both Weakness of Arms/Hands: None  Admission diagnosis:  Jaundice [R17] Patient Active Problem List   Diagnosis Date Noted  . Jaundice 09/25/2020  . Biliary obstruction 08/27/2020  . Pleural effusion on left 03/14/2019  . Chronic kidney disease (CKD), stage II (mild) 03/11/2019  . Sepsis ([place]) 03/11/2019  . Abscess of abdominal cavity ([place]) 03/11/2019  . Type II diabetes mellitus with renal manifestations ([place]) 03/11/2019  . Hypokalemia 03/11/2019  . Chronic diastolic CHF (congestive heart failure) ([place]) 03/11/2019  . Abnormal LFTs 03/11/2019  . Elevated troponin 03/11/2019  . Severe sepsis ([place]) 03/11/2019  . Fever 02/25/2019  . Atrial fibrillation with RVR ([place])   . Postoperative intra-abdominal abscess 01/31/2019  . Cancer of sigmoid colon  12/27/2018  . History of adenomatous polyp of colon 12/27/2018  . 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 CKD (chronic kidney disease) stage 3, GFR 30-59 ml/min (HCC) 12/27/2018  . Obesity (BMI 30-39.9) 12/27/2018  . Cholelithiasis 12/27/2018  . Choledocholithiasis by MRCP 12/27/2018  . Status post partial colectomy Feb 2020 12/27/2018  . Colonic obstruction due to metastatic colon cancer 12/26/2018  . Anticoagulant long-term use 12/26/2018  . HLD (hyperlipidemia) 12/22/2018  . Atrial fibrillation, chronic ([place]) 12/22/2018  .  Depression, recurrent ([place]) 12/22/2018  . Chest pain 12/22/2018  . Port-A-Cath in place 12/04/2018  . Goals of  care, counseling/discussion 11/22/2018  . GI bleed 11/02/2018  . Rectal bleed   . Cancer of splenic flexure of colon 10/30/2018  . Iron deficiency anemia 04/04/2018  . 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 Pain in right ankle and joints of right foot 01/10/2018  . Essential (primary) hypertension 03/25/2015  . Insulin dependent diabetes mellitus 03/25/2015   PCP:  No primary care provider on file. Pharmacy:   [id number] - [place] [place] [address] Phone: [phone] Fax: [phone]     Social Determinants of Health (SDOH) Interventions    Readmission Risk Interventions Readmission Risk Prevention Plan 09/28/2020 03/13/2019  Transportation Screening Complete Complete  PCP or Specialist Appt within 3-5 Days Not Complete -  Not Complete comments Not ready for dc yet -  [place] or Home Care Consult Complete -  Social Work Consult for [place] Planning/Counseling Complete -  Palliative Care Screening Not Applicable -  Medication Review Press photographer) Complete Complete  PCP or Specialist appointment within 3-5 days of discharge - Complete  [place]RI or [place] - Not Complete  HRI or Home Care Consult Pt Refusal Comments - Plan for discharge to SNF.  SW Recovery Care/Counseling Consult - Complete  Palliative Care Screening - Not Applicable  Skilled Nursing Facility - Complete  Some recent data might be hidden

## 2020-09-28 NOTE — Care Management Important Message (Signed)
Important Message  Patient Details IM Letter given to the Patient Name: LEIGHTYN CINA MRN: [id number] Date of Birth: [birth year]   Medicare Important Message Given:  Yes     Kerin Salen 09/28/2020, 10:07 AM

## 2020-09-28 NOTE — Progress Notes (Signed)
Patient scheduled to have an ERCP tomorrow with Dr. Watt Climes. Patient has not had a Covid test on this admission. Order placed per Dr. Watt Climes for a Covid test to be done today. Talked with Patients nurse on the floor and made her aware that this needed to be done today per order.

## 2020-09-29 ENCOUNTER — Inpatient Hospital Stay (HOSPITAL_COMMUNITY): Payer: Medicare (Managed Care)

## 2020-09-29 ENCOUNTER — Inpatient Hospital Stay (HOSPITAL_COMMUNITY): Payer: Medicare (Managed Care) | Admitting: Anesthesiology

## 2020-09-29 ENCOUNTER — Encounter (HOSPITAL_COMMUNITY)
Admission: AD | Disposition: A | Discharge status: Home Health | Payer: Self-pay | Source: Other Acute Inpatient Hospital | Attending: Internal Medicine

## 2020-09-29 ENCOUNTER — Encounter (HOSPITAL_COMMUNITY): Payer: Self-pay | Admitting: Internal Medicine

## 2020-09-29 DIAGNOSIS — Z515 Encounter for palliative care: Secondary | ICD-10-CM

## 2020-09-29 DIAGNOSIS — K831 Obstruction of bile duct: Secondary | ICD-10-CM | POA: Diagnosis not present

## 2020-09-29 DIAGNOSIS — C185 Malignant neoplasm of splenic flexure: Secondary | ICD-10-CM | POA: Diagnosis not present

## 2020-09-29 DIAGNOSIS — Z7189 Other specified counseling: Secondary | ICD-10-CM

## 2020-09-29 DIAGNOSIS — R17 Unspecified jaundice: Secondary | ICD-10-CM | POA: Diagnosis not present

## 2020-09-29 HISTORY — PX: BILIARY STENT PLACEMENT: SHX5538

## 2020-09-29 HISTORY — PX: ERCP: SHX5425

## 2020-09-29 HISTORY — PX: SPHINCTEROTOMY: SHX5544

## 2020-09-29 LAB — COMPREHENSIVE METABOLIC PANEL
ALT: 52 U/L — ABNORMAL HIGH (ref 0–44)
AST: 48 U/L — ABNORMAL HIGH (ref 15–41)
Albumin: 1.8 g/dL — ABNORMAL LOW (ref 3.5–5.0)
Alkaline Phosphatase: 479 U/L — ABNORMAL HIGH (ref 38–126)
Anion gap: 8 (ref 5–15)
BUN: 48 mg/dL — ABNORMAL HIGH (ref 8–23)
CO2: 21 mmol/L — ABNORMAL LOW (ref 22–32)
Calcium: 8.3 mg/dL — ABNORMAL LOW (ref 8.9–10.3)
Chloride: 106 mmol/L (ref 98–111)
Creatinine, Ser: 2.28 mg/dL — ABNORMAL HIGH (ref 0.44–1.00)
GFR, Estimated: 22 mL/min — ABNORMAL LOW (ref >60)
Glucose, Bld: 102 mg/dL — ABNORMAL HIGH (ref 70–99)
Potassium: 3.6 mmol/L (ref 3.5–5.1)
Sodium: 135 mmol/L (ref 135–145)
Total Bilirubin: 7.9 mg/dL — ABNORMAL HIGH (ref 0.3–1.2)
Total Protein: 4.5 g/dL — ABNORMAL LOW (ref 6.5–8.1)

## 2020-09-29 LAB — CBC WITH DIFFERENTIAL/PLATELET
Abs Immature Granulocytes: 0.14 10*3/uL — ABNORMAL HIGH (ref 0.00–0.07)
Abs Immature Granulocytes: 0.2 10*3/uL — ABNORMAL HIGH (ref 0.00–0.07)
Basophils Absolute: 0 10*3/uL (ref 0.0–0.1)
Basophils Absolute: 0.1 10*3/uL (ref 0.0–0.1)
Basophils Relative: 1 %
Basophils Relative: 1 %
Eosinophils Absolute: 0.2 10*3/uL (ref 0.0–0.5)
Eosinophils Absolute: 0.1 10*3/uL (ref 0.0–0.5)
Eosinophils Relative: 2 %
Eosinophils Relative: 1 %
HCT: 22.7 % — ABNORMAL LOW (ref 36.0–46.0)
HCT: 22.9 % — ABNORMAL LOW (ref 36.0–46.0)
Hemoglobin: 7.1 g/dL — ABNORMAL LOW (ref 12.0–15.0)
Hemoglobin: 7.1 g/dL — ABNORMAL LOW (ref 12.0–15.0)
Immature Granulocytes: 2 %
Immature Granulocytes: 2 %
Lymphocytes Relative: 3 %
Lymphocytes Relative: 3 %
Lymphs Abs: 0.3 10*3/uL — ABNORMAL LOW (ref 0.7–4.0)
Lymphs Abs: 0.3 10*3/uL — ABNORMAL LOW (ref 0.7–4.0)
MCH: 28.7 pg (ref 26.0–34.0)
MCH: 28.9 pg (ref 26.0–34.0)
MCHC: 31.3 g/dL (ref 30.0–36.0)
MCHC: 31 g/dL (ref 30.0–36.0)
MCV: 91.9 fL (ref 80.0–100.0)
MCV: 93.1 fL (ref 80.0–100.0)
Monocytes Absolute: 0.8 10*3/uL (ref 0.1–1.0)
Monocytes Absolute: 0.7 10*3/uL (ref 0.1–1.0)
Monocytes Relative: 11 %
Monocytes Relative: 7 %
Neutro Abs: 6.2 10*3/uL (ref 1.7–7.7)
Neutro Abs: 7.8 10*3/uL — ABNORMAL HIGH (ref 1.7–7.7)
Neutrophils Relative %: 81 %
Neutrophils Relative %: 86 %
Platelets: 248 10*3/uL (ref 150–400)
Platelets: 255 10*3/uL (ref 150–400)
RBC: 2.47 MIL/uL — ABNORMAL LOW (ref 3.87–5.11)
RBC: 2.46 MIL/uL — ABNORMAL LOW (ref 3.87–5.11)
RDW: 17 % — ABNORMAL HIGH (ref 11.5–15.5)
RDW: 16.9 % — ABNORMAL HIGH (ref 11.5–15.5)
WBC: 7.6 10*3/uL (ref 4.0–10.5)
WBC: 9 10*3/uL (ref 4.0–10.5)
nRBC: 0 % (ref 0.0–0.2)
nRBC: 0 % (ref 0.0–0.2)

## 2020-09-29 LAB — BASIC METABOLIC PANEL
Anion gap: 9 (ref 5–15)
BUN: 51 mg/dL — ABNORMAL HIGH (ref 8–23)
CO2: 23 mmol/L (ref 22–32)
Calcium: 8.3 mg/dL — ABNORMAL LOW (ref 8.9–10.3)
Chloride: 104 mmol/L (ref 98–111)
Creatinine, Ser: 2.06 mg/dL — ABNORMAL HIGH (ref 0.44–1.00)
GFR, Estimated: 25 mL/min — ABNORMAL LOW (ref >60)
Glucose, Bld: 126 mg/dL — ABNORMAL HIGH (ref 70–99)
Potassium: 3.8 mmol/L (ref 3.5–5.1)
Sodium: 136 mmol/L (ref 135–145)

## 2020-09-29 LAB — GLUCOSE, CAPILLARY
Glucose-Capillary: 87 mg/dL (ref 70–99)
Glucose-Capillary: 76 mg/dL (ref 70–99)
Glucose-Capillary: 73 mg/dL (ref 70–99)
Glucose-Capillary: 131 mg/dL — ABNORMAL HIGH (ref 70–99)
Glucose-Capillary: 113 mg/dL — ABNORMAL HIGH (ref 70–99)
Glucose-Capillary: 235 mg/dL — ABNORMAL HIGH (ref 70–99)

## 2020-09-29 LAB — PROTIME-INR
INR: 1.2 (ref 0.8–1.2)
Prothrombin Time: 14.4 seconds (ref 11.4–15.2)

## 2020-09-29 LAB — MRSA PCR SCREENING: MRSA by PCR: NEGATIVE

## 2020-09-29 SURGERY — ERCP, WITH INTERVENTION IF INDICATED
Anesthesia: General

## 2020-09-29 MED ORDER — INDOMETHACIN 50 MG RE SUPP
RECTAL | Status: AC
  Filled 2020-09-29: qty 2

## 2020-09-29 MED ORDER — ROCURONIUM BROMIDE 10 MG/ML (PF) SYRINGE
PREFILLED_SYRINGE | INTRAVENOUS | Status: DC | PRN
  Administered 2020-09-29: 60 mg via INTRAVENOUS

## 2020-09-29 MED ORDER — GLUCAGON HCL RDNA (DIAGNOSTIC) 1 MG IJ SOLR
INTRAMUSCULAR | Status: AC
  Filled 2020-09-29: qty 1

## 2020-09-29 MED ORDER — PHENYLEPHRINE HCL-NACL 10-0.9 MG/250ML-% IV SOLN
INTRAVENOUS | Status: DC | PRN
  Administered 2020-09-29: 35 ug/min via INTRAVENOUS

## 2020-09-29 MED ORDER — PROPOFOL 10 MG/ML IV BOLUS
INTRAVENOUS | Status: DC | PRN
  Administered 2020-09-29: 100 mg via INTRAVENOUS

## 2020-09-29 MED ORDER — SODIUM CHLORIDE 0.9 % IV SOLN
INTRAVENOUS | Status: DC | PRN
  Administered 2020-09-29: 10 mL

## 2020-09-29 MED ORDER — OXYCODONE HCL 5 MG PO TABS
5.0000 mg | ORAL_TABLET | ORAL | Status: AC | PRN
  Administered 2020-09-29 – 2020-09-30 (×2): 5 mg via ORAL
  Filled 2020-09-29 (×2): qty 1

## 2020-09-29 MED ORDER — ONDANSETRON HCL 4 MG/2ML IJ SOLN
INTRAMUSCULAR | Status: DC | PRN
  Administered 2020-09-29: 4 mg via INTRAVENOUS

## 2020-09-29 MED ORDER — PROPOFOL 1000 MG/100ML IV EMUL
INTRAVENOUS | Status: AC
  Filled 2020-09-29: qty 100

## 2020-09-29 MED ORDER — PHENYLEPHRINE HCL (PRESSORS) 10 MG/ML IV SOLN
INTRAVENOUS | Status: DC | PRN
  Administered 2020-09-29 (×2): 80 ug via INTRAVENOUS

## 2020-09-29 MED ORDER — SODIUM CHLORIDE 0.9 % IV SOLN
3.0000 g | Freq: Once | INTRAVENOUS | Status: AC
  Administered 2020-09-29: 3 g via INTRAVENOUS
  Filled 2020-09-29: qty 3

## 2020-09-29 MED ORDER — ALBUMIN HUMAN 5 % IV SOLN: INTRAVENOUS | Status: DC | PRN

## 2020-09-29 MED ORDER — FENTANYL CITRATE (PF) 100 MCG/2ML IJ SOLN
INTRAMUSCULAR | Status: AC
  Filled 2020-09-29: qty 2

## 2020-09-29 MED ORDER — FENTANYL CITRATE (PF) 100 MCG/2ML IJ SOLN
INTRAMUSCULAR | Status: DC | PRN
  Administered 2020-09-29: 50 ug via INTRAVENOUS

## 2020-09-29 MED ORDER — LACTATED RINGERS IV SOLN: INTRAVENOUS | Status: DC | PRN

## 2020-09-29 MED ORDER — SUGAMMADEX SODIUM 200 MG/2ML IV SOLN
INTRAVENOUS | Status: DC | PRN
  Administered 2020-09-29 (×2): 200 mg via INTRAVENOUS

## 2020-09-29 MED ORDER — SODIUM CHLORIDE 0.9 % IV SOLN
3.0000 g | Freq: Two times a day (BID) | INTRAVENOUS | Status: DC
  Administered 2020-09-30 – 2020-10-01 (×3): 3 g via INTRAVENOUS
  Filled 2020-09-29 (×3): qty 8

## 2020-09-29 MED ORDER — CIPROFLOXACIN IN D5W 400 MG/200ML IV SOLN
INTRAVENOUS | Status: DC | PRN
  Administered 2020-09-29: 400 mg via INTRAVENOUS

## 2020-09-29 MED ORDER — SODIUM CHLORIDE 0.9 % IV SOLN: INTRAVENOUS | Status: DC

## 2020-09-29 MED ORDER — CIPROFLOXACIN IN D5W 400 MG/200ML IV SOLN
INTRAVENOUS | Status: AC
  Filled 2020-09-29: qty 200

## 2020-09-29 MED ORDER — LIDOCAINE 2% (20 MG/ML) 5 ML SYRINGE
INTRAMUSCULAR | Status: DC | PRN
  Administered 2020-09-29: 50 mg via INTRAVENOUS

## 2020-09-29 NOTE — Consult Note (Signed)
Consultation Note Date: 09/29/2020   Patient Name: Cassandra Allen  DOB: 08/01/1946  MRN: [id number]  Age / Sex: 74 y.o., female  PCP: No primary care provider on file. Referring Physician: Shelly Coss, MD  Reason for Consultation: Establishing goals of care  HPI/Patient Profile: 74 y.o. 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  She had CT followed by MRCP that revealed worsening mets with biliary obstruction.  She underwent successful ERCP earlier today but did have respiratory issues during time coming out of sedation.  She is currently awake and alert in ICU.  Palliative consulted for [place].    Clinical Assessment and Goals of Care: I met today with Cassandra Allen.  She was awake and alert and able to speak with me in the ICU.    I introduced palliative care as specialized medical care for people living with serious illness. It focuses on providing relief from the symptoms and stress of a serious illness. The goal is to improve quality of life for both the patient and the family.  We discussed her clinical course over the past couple of weeks and increasing juandice and other symptoms related to this.  We discussed the fact that she has incurable illness and the difficulty she has experienced in dealing with decline in her nutrition and functional status.  We discussed code status and concerns related to that fact that heroic interventions in the event of cardiac or respiratory arrest are not going to reverse her underlying issues related to her cancer.  She expressed understanding concerns, but she also reports that she is worn out and just needs time to see how her body responds to stent placement prior to making further decisions regarding long term  GOC.  I talked with her about her understanding regarding plan with Dr. Burr Medico. She reports needing to have the opportunity to discuss further with her once it is determined how much benefit and improvement in liver function she has following stent.  SUMMARY OF RECOMMENDATIONS   - Partial code- "keep things just how they are and see how the next couple days go" - Cassandra Allen reports understanding the severity of her condition, however, she wants to she how she responds to stent placement.  - She trusts Dr. Burr Medico and appreciates her recommendations on next steps and helping determine if further chemotherapy may be beneficial. - Continue current interventions and reassessment of her clinical situation over the next 48 hours.  Code Status/Advance Care Planning:  Limited code  Prognosis:   < 6 months if plan is determined to be no further disease modifying therapy and she should qualify for hospice moving forward if so desired.  Discharge Planning: To Be Determined      Primary Diagnoses: Present on Admission: . Jaundice . Essential (primary) hypertension . Cancer of splenic flexure of colon . Atrial fibrillation, chronic ([place]) . HLD (hyperlipidemia) . CKD (chronic kidney disease)  stage 3, GFR 30-59 ml/min (HCC)   I have reviewed the medical record, interviewed the patient and family, and examined the patient. The following aspects are pertinent.  Past Medical History:  Diagnosis Date  . Acute diastolic heart failure ([place]) 12/22/2018  . Arthritis   . Atrial fibrillation, chronic ([place]) 12/22/2018  . Cancer of left colon ([place]) 10/30/2018  . Cancer of sigmoid colon  12/27/2018  . Diabetes mellitus without complication ([place])   . Hypertension   . Obesity (BMI 30-39.9) 12/27/2018   Social History   Socioeconomic History  . Marital status: Divorced    Spouse name: Not on file  . Number of children: 2  . Years of education: Not on file  . Highest education level: Not on file   Occupational History  . Not on file  Tobacco Use  . Smoking status: Never Smoker  . Smokeless tobacco: Never Used  Vaping Use  . Vaping Use: Never used  Substance and Sexual Activity  . Alcohol use: No  . Drug use: No  . Sexual activity: Not on file  Other Topics Concern  . Not on file  Social History Narrative  . Not on file   Social Determinants of Health   Financial Resource Strain:   . Difficulty of Paying Living Expenses: Not on file  Food Insecurity:   . Worried About Charity fundraiser in the Last Year: Not on file  . Ran Out of Food in the Last Year: Not on file  Transportation Needs:   . Lack of Transportation (Medical): Not on file  . Lack of Transportation (Non-Medical): Not on file  Physical Activity:   . Days of Exercise per Week: Not on file  . Minutes of Exercise per Session: Not on file  Stress:   . Feeling of Stress : Not on file  Social Connections:   . Frequency of Communication with Friends and Family: Not on file  . Frequency of Social Gatherings with Friends and Family: Not on file  . Attends Religious Services: Not on file  . Active Member of Clubs or Organizations: Not on file  . Attends Archivist Meetings: Not on file  . Marital Status: Not on file   Family History  Problem Relation Age of Onset  . Heart attack Mother   . Heart attack Father    Scheduled Meds: . Chlorhexidine Gluconate Cloth  6 each Topical Daily  . heparin injection (subcutaneous)  5,000 Units Subcutaneous Q8H  . insulin aspart  0-9 Units Subcutaneous TID WC  . insulin detemir  16 Units Subcutaneous Daily  . metoprolol tartrate  12.5 mg Oral BID  . oxybutynin  20 mg Oral Daily  . pantoprazole  40 mg Oral Daily  . sodium chloride flush  3 mL Intravenous Q12H  . ursodiol  300 mg Oral BID   Continuous Infusions: . sodium chloride    . [START ON 09/30/2020] ampicillin-sulbactam (UNASYN) IV    . lactated ringers 75 mL/hr at 09/29/20 1411   PRN  Meds:.ondansetron **OR** ondansetron (ZOFRAN) IV, oxyCODONE Medications Prior to Admission:  Prior to Admission medications   Medication Sig Start Date End Date Taking? Authorizing Provider  amLODipine (NORVASC) 5 MG tablet Take 5 mg by mouth daily.  08/21/19  Yes [provider]  fentaNYL (DURAGESIC) 25 MCG/HR Place 1 patch onto the skin every 3 (three) days. 09/04/20  Yes Shelly Coss, MD  ferrous sulfate (KP FERROUS SULFATE) 325 (65 FE) MG tablet Take 1 tablet (  325 mg total) by mouth daily with breakfast. 09/03/20  Yes Adhikari, Amrit, MD  Insulin Detemir (LEVEMIR) 100 UNIT/ML Pen Inject 16 Units into the skin daily. 03/21/19  Yes Hongalgi, Lenis Dickinson, MD  lidocaine (LIDODERM) 5 % Place 1 patch onto the skin daily. Remove & Discard patch within 12 hours or as directed by MD 09/03/20  Yes Shelly Coss, MD  methocarbamol (ROBAXIN) 500 MG tablet Take 1 tablet (500 mg total) by mouth every 6 (six) hours as needed for muscle spasms. 09/03/20  Yes Shelly Coss, MD  metoprolol tartrate (LOPRESSOR) 25 MG tablet Take 12.5 mg by mouth 2 (two) times daily.  12/02/19  Yes [provider]  oxybutynin (DITROPAN-XL) 10 MG 24 hr tablet Take 1 tablet (10 mg total) by mouth daily. 03/20/19  Yes Hongalgi, Lenis Dickinson, MD  oxyCODONE (OXY IR/ROXICODONE) 5 MG immediate release tablet Take 1 tablet (5 mg total) by mouth every 4 (four) hours as needed for moderate pain or severe pain. 09/03/20  Yes Shelly Coss, MD  pantoprazole (PROTONIX) 40 MG tablet Take 1 tablet (40 mg total) by mouth daily. 09/03/20 11/02/20 Yes Shelly Coss, MD  QUEtiapine (SEROQUEL) 25 MG tablet Take 12.5 mg by mouth at bedtime.  02/04/20  Yes [provider]  sertraline (ZOLOFT) 100 MG tablet Take 100 mg by mouth in the morning and at bedtime.   Yes [provider]  simvastatin (ZOCOR) 10 MG tablet Take 1 tablet (10 mg total) by mouth daily at 6 PM. 03/20/19  Yes Hongalgi, Lenis Dickinson, MD  ursodiol (ACTIGALL) 300  MG capsule Take 1 capsule (300 mg total) by mouth 2 (two) times daily. 09/03/20  Yes Shelly Coss, MD  hydrocortisone 1 % lotion Apply 1 application topically 2 (two) times daily. Patient not taking: Reported on 08/27/2020 06/21/20   Truitt Merle, MD  liraglutide (VICTOZA) 18 MG/3ML SOPN Inject 0.3 mLs (1.8 mg total) into the skin every morning. Patient not taking: Reported on 08/27/2020 03/20/19   Modena Jansky, MD   Allergies  Allergen Reactions  . Metformin And Related Nausea And Vomiting  . Prednisone Nausea And Vomiting   Review of Systems  Constitutional: Positive for activity change and fatigue.  Neurological: Positive for weakness.  Psychiatric/Behavioral: The patient is nervous/anxious.    Physical Exam General: Alert, awake, in no acute distress.  HEENT: No bruits, no goiter, no JVD, + icterus Heart: Regular rate and rhythm. No murmur appreciated. Lungs: Good air movement, clear Abdomen: Soft, nontender, nondistended, positive bowel sounds.  Ext: No significant edema Skin: Warm and dry, jaundiced Neuro: Grossly intact, nonfocal.     Vital Signs: BP (!) 125/41   Pulse (!) 48   Temp 98.4 F (36.9 C) (Oral)   Resp 14   Ht 5' 2" (1.575 m)   Wt 71.2 kg   SpO2 100%   BMI 28.71 kg/m  Pain Scale: 0-10   Pain Score: 7    SpO2: SpO2: 100 % O2 Device:SpO2: 100 % O2 Flow Rate: .O2 Flow Rate (L/min): 4 L/min  IO: Intake/output summary:   Intake/Output Summary (Last 24 hours) at 09/29/2020 2256 Last data filed at 09/29/2020 1800 Gross per 24 hour  Intake 1370 ml  Output 601 ml  Net 769 ml    LBM: Last BM Date: 09/29/20 (via olostomy) Baseline Weight: Weight: 66.6 kg Most recent weight: Weight: 71.2 kg     Palliative Assessment/Data:   Flowsheet Rows     Most Recent Value  Intake Tab  Referral  Department Hospitalist  Unit at Time of Referral ICU  Palliative Care Primary Diagnosis Cancer  Date Notified 09/28/20  Palliative Care Type New Palliative care    Reason for referral Clarify Goals of Care  Date of Admission 09/25/20  Date first seen by Palliative Care 09/29/20  # of days Palliative referral response time 1 Day(s)  # of days IP prior to Palliative referral 3  Clinical Assessment  Palliative Performance Scale Score 40%  Psychosocial & Spiritual Assessment  Palliative Care Outcomes  Patient/Family meeting held? Yes  Who was at the meeting? Patient  Palliative Care Outcomes Clarified goals of care      Time In: 1800 Time Out: 1855 Time Total: 55 Greater than 50%  of this time was spent counseling and coordinating care related to the above assessment and plan.  Signed by: Micheline Rough, MD   Please contact Palliative Medicine Team phone at [phone] for questions and concerns.  For individual provider: See Shea Evans

## 2020-09-29 NOTE — Progress Notes (Signed)
PROGRESS NOTE    Cassandra Allen  QIO:[id number] DOB: 1946-03-01 DOA: 09/25/2020 PCP: No primary care provider on file.   Chief Complain: Jaundice, abdominal pain  Brief Narrative: Patient is a 74 year old female with past medical history of metastatic colon cancer to liver, adrenal gland status post colectomy/colostomy, chronic A. fib, diastolic congestive heart failure who presented to the emergency department with complaints of abdominal pain, nausea, vomiting, increased jaundice, loss of appetite.  She was recently admitted and discharged at this hospital for biliary obstruction secondary to hepatic metastasis compressing the biliary duct.  On presentation, elevated bilirubin in the range of 13.  CT imaging showed worsening biliary dilation worsening metastasis.  She was also found to have severe AKI with creatinine in the range of 2.7, elevated alkaline phosphatase.  Oncology, GI consulted.  Went MRCP during this hospitalization. Underwent ERCP today with placement of biliary stent but she was in acute hypoxic respiratory failure and needs  to be moved to ICU.  Assessment & Plan:   Principal Problem:   Jaundice Active Problems:   Essential (primary) hypertension   Cancer of splenic flexure of colon   HLD (hyperlipidemia)   Atrial fibrillation, chronic (HCC)   CKD (chronic kidney disease) stage 3, GFR 30-59 ml/min (HCC)   Right upper quadrant pain/obstructive jaundice: She has history of metastatic colon cancer to liver/adrenal  gland.  She is a status post colostomy.  She has been started on Zosyn for the suspicion of cholangitis which has been D/Ced now.   Currently on gentle  IV fluids, continue antiemetics, analgesics. Underwent MRCP which  showed markedly progressed intrahepatic biliary duct dilatation since 08/05/2020 with suspicion for metastatic obstructive process. Continue ursodiol.  She has elevated liver enzymes.  Underwent biliary sphincterotomy with placement of  metal stent in the CBD.  Acute hypoxic respiratory failure: Was intubated for ERCP but became hypoxic after extubation and had to be nearly intubated again.  Was noted to have thick secretions.  Currently saturation better with facemasks.  Will check chest x-ray.  She is a DNI  AKI : Continue gentle IV fluids.  Renal ultrasound did not show any obstruction.  Creatinine on 09/02/2020 was 0.7.  Urine sodium is 104..  CT has shown infiltration of the cancer into the adrenal gland and possibly kidneys which could have played a role on this.  Acute metabolic encephalopathy: Resolved, currently alert and awake.  She has memory impairment at baseline  Normocytic anemia: Hemoglobin in the range of 7.  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  Antihypertensives on hold.  Chronic A. fib: On metoprolol for rate control at home.  Not on anticoagulation.  Chronic diastolic congestive heart failure: Appeared dehydrated on presentation.  IV fluids to be continued for now.  Has some elevated BNP.  Insulin-dependent diabetes mellitus: Continue current insulin .  Continue to monitor blood sugars  Failure to thrive/deconditioning/debility: Uses walker for ambulation at home.  PT consulted and recommended HH  Goals of care: Very deconditioned/debilitated female with metastatic colon cancer.  Now has developed obstructive jaundice,AKI.  Remains very weak.  She has very poor quality of life.  We recommend palliative approach/hospice.  I discussed with the son and gave the clear picture of her poor prognosis.  He is interested to know about hospice at home.  I have consulted palliative care for  assistance     DVT prophylaxis: Heparin [place] Code Status: Full code Family Communication: Called and discussed with the son on phone on  09/28/2020 Status is: Inpatient  Remains inpatient appropriate because:Inpatient level of care appropriate due to severity of illness   Dispo: The patient is from: Home              Anticipated d/c is to: Home vs home with hospice              Anticipated d/c date is: 2 days              Patient currently is not medically stable to d/c.     Consultants: Gi  Procedures:None  Antimicrobials:  Anti-infectives (From admission, onward)   Start     Dose/Rate Route Frequency Ordered Stop   09/27/20 0200  piperacillin-tazobactam (ZOSYN) IVPB 2.25 g  Status:  Discontinued        2.25 g 100 mL/hr over 30 Minutes Intravenous Every 8 hours 09/26/20 2000 09/27/20 1620   09/26/20 0200  piperacillin-tazobactam (ZOSYN) IVPB 3.375 g  Status:  Discontinued        3.375 g 12.5 mL/hr over 240 Minutes Intravenous Every 8 hours 09/25/20 2356 09/26/20 1959      Subjective:   Patient seen and examined at the endoscopy suite.  She was on facemask with improvement in the oxygen saturation.  Looks very weak, little short of breath.  Was not able to talk.  Objective: Vitals:   09/28/20 0504 09/28/20 1327 09/28/20 2147 09/29/20 0601  BP: (!) 100/58 (!) 111/45 (!) 133/53 (!) 134/56  Pulse: 60 67 66 (!) 56  Resp: 20 18 16 14   Temp: 98.3 F (36.8 C) 98.1 F (36.7 C) 98.2 F (36.8 C) 97.9 F (36.6 C)  TempSrc: Oral Oral Oral Oral  SpO2: 96% 96% 96% 94%  Weight:    71.2 kg  Height:        Intake/Output Summary (Last 24 hours) at 09/29/2020 0825 Last data filed at 09/28/2020 1731 Gross per 24 hour  Intake --  Output 1200 ml  Net -1200 ml   Filed Weights   09/26/20 0702 09/27/20 0449 09/29/20 0601  Weight: 68.6 kg 72 kg 71.2 kg    Examination:  General exam: Icteric, chronically ill looking Respiratory system: No wheezes or crackles Cardiovascular system: S1 & S2 heard, RRR. No JVD, murmurs, rubs, gallops or clicks.  Chemo-Port on the right chest Gastrointestinal system: Abdomen is  distended, soft and nontender. No organomegaly or masses felt. Normal bowel sounds heard. Central nervous system: Alert and oriented. No focal neurological deficits. Extremities: No edema, no clubbing ,no cyanosis Skin:severe  Icterus   Data Reviewed: I have personally reviewed following labs and imaging studies  CBC: Recent Labs  Lab 09/26/20 0714 09/27/20 0516 09/28/20 0631 09/29/20 0500  WBC 7.3 9.2 7.7 7.6  NEUTROABS  --  7.9* 6.3 6.2  HGB 7.8* 7.2* 7.0* 7.1*  HCT 25.7* 23.2* 22.7* 22.7*  MCV 95.2 93.2 93.4 91.9  PLT 178 177 219 329   Basic Metabolic Panel: Recent Labs  Lab 09/26/20 0714 09/27/20 0516 09/28/20 0631 09/29/20 0500  NA 137 133* 132* 135  K 3.2* 3.4* 3.6 3.6  CL 106 106 105 106  CO2 19* 18* 19* 21*  GLUCOSE 108* 126* 92 102*  BUN 72* 62* 58* 48*  CREATININE 2.71* 2.63* 2.61* 2.28*  CALCIUM 8.0* 7.8* 8.0* 8.3*  MG 1.5* 1.8  --   --  PHOS 4.4  --   --   --    GFR: Estimated Creatinine Clearance: 20.3 mL/min (A) (by C-G formula based on SCr of 2.28 mg/dL (H)). Liver Function Tests: Recent Labs  Lab 09/26/20 0714 09/27/20 0516 09/28/20 0631 09/29/20 0500  AST 73* 71* 57* 48*  ALT 69* 65* 57* 52*  ALKPHOS 625* 566* 475* 479*  BILITOT 9.8* 9.7* 8.9* 7.9*  PROT 4.9* 4.5* 4.4* 4.5*  ALBUMIN 2.0* 1.7* 1.7* 1.8*   Recent Labs  Lab 09/26/20 0714  LIPASE 25   Recent Labs  Lab 09/26/20 0714  AMMONIA 29   Coagulation Profile: Recent Labs  Lab 09/26/20 0714 09/29/20 0500  INR 1.9* 1.2   Cardiac Enzymes: No results for input(s): CKTOTAL, CKMB, CKMBINDEX, TROPONINI in the last 168 hours. BNP (last 3 results) No results for input(s): PROBNP in the last 8760 hours. HbA1C: No results for input(s): HGBA1C in the last 72 hours. CBG: Recent Labs  Lab 09/28/20 0809 09/28/20 1158 09/28/20 1658 09/28/20 2149 09/29/20 0736  GLUCAP 89 81 111* 87 76   Lipid Profile: No results for input(s): CHOL, HDL, LDLCALC, TRIG, CHOLHDL, LDLDIRECT in  the last 72 hours. Thyroid Function Tests: No results for input(s): TSH, T4TOTAL, FREET4, T3FREE, THYROIDAB in the last 72 hours. Anemia Panel: Recent Labs    09/28/20 0631  FERRITIN 577*  TIBC 130*  IRON 65   Sepsis Labs: Recent Labs  Lab 09/26/20 0714  LATICACIDVEN 0.6    Recent Results (from the past 240 hour(s))  Culture, Urine     Status: Abnormal   Collection Time: 09/26/20  1:07 AM   Specimen: Urine, Clean Catch  Result Value Ref Range Status   Specimen Description   Final    URINE, CLEAN CATCH Performed at [facility], [place] [address], [place], [place] [address]    Special Requests   Final    NONE Performed at [facility], [place] [address], [place], [place] [address]    Culture MULTIPLE SPECIES PRESENT, SUGGEST RECOLLECTION (A)  Final   Report Status 09/27/2020 FINAL  Final  Respiratory Panel by RT PCR (Flu A&B, Covid) - Nasopharyngeal Swab     Status: None   Collection Time: 09/28/20  3:36 PM   Specimen: Nasopharyngeal Swab  Result Value Ref Range Status   SARS Coronavirus 2 by RT PCR NEGATIVE NEGATIVE Final    Comment: (NOTE) SARS-CoV-2 target nucleic acids are NOT DETECTED.  The SARS-CoV-2 RNA is generally detectable in upper respiratoy specimens during the acute phase of infection. The lowest concentration of SARS-CoV-2 viral copies this assay can detect is 131 copies/mL. A negative result does not preclude SARS-Cov-2 infection and should not be used as the sole basis for treatment or other patient management decisions. A negative result may occur with  improper specimen collection/handling, submission of specimen other than nasopharyngeal swab, presence of viral mutation(s) within the areas targeted by this assay, and inadequate number of viral copies (<131 copies/mL). A negative result must be combined with clinical observations, patient history, and epidemiological information. The expected result is  Negative.  Fact Sheet for Patients:  PinkCheek.be  Fact Sheet for Healthcare Providers:  GravelBags.it  This test is no t yet approved or cleared by the [place] FDA and  has been authorized for detection and/or diagnosis of SARS-CoV-2 by FDA under an Emergency Use Authorization (EUA). This EUA will remain  in effect (meaning this test can be used) for the duration of the COVID-19  declaration under Section 564(b)(1) of the Act, 21 U.S.C. section 360bbb-3(b)(1), unless the authorization is terminated or revoked sooner.     Influenza A by PCR NEGATIVE NEGATIVE Final   Influenza B by PCR NEGATIVE NEGATIVE Final    Comment: (NOTE) The Xpert Xpress SARS-CoV-2/FLU/RSV assay is intended as an aid in  the diagnosis of influenza from Nasopharyngeal swab specimens and  should not be used as a sole basis for treatment. Nasal washings and  aspirates are unacceptable for Xpert Xpress SARS-CoV-2/FLU/RSV  testing.  Fact Sheet for Patients: PinkCheek.be  Fact Sheet for Healthcare Providers: GravelBags.it  This test is not yet approved or cleared by the [place] FDA and  has been authorized for detection and/or diagnosis of SARS-CoV-2 by  FDA under an Emergency Use Authorization (EUA). This EUA will remain  in effect (meaning this test can be used) for the duration of the  Covid-19 declaration under Section 564(b)(1) of the Act, 21  U.S.C. section 360bbb-3(b)(1), unless the authorization is  terminated or revoked. Performed at [facility], [place] [address], [place],  [address]          Radiology Studies: MR ABDOMEN MRCP WO CONTRAST  Result Date: 09/27/2020 CLINICAL DATA:  Jaundice.  Metastatic colon cancer with liver mets. EXAM: MRI ABDOMEN WITHOUT CONTRAST  (INCLUDING MRCP) TECHNIQUE: Multiplanar multisequence MR imaging of the  abdomen was performed. Heavily T2-weighted images of the biliary and pancreatic ducts were obtained, and three-dimensional MRCP images were rendered by post processing. COMPARISON:  08/05/2020 FINDINGS: Lower chest: Small bilateral pleural effusions with bibasilar collapse/consolidation, right greater than left. Hepatobiliary: Intrahepatic biliary duct dilatation is markedly progressed since 08/05/2020. Biliary dilatation is symmetric in the right and left hepatic lobes and both the right and left ductal anatomy becomes obliterated in the region of the ductal confluence at the porta hepatis. Common bile duct reconstitutes in the head of pancreas, nondilated at 6 mm diameter. The area of ductal termination does not appear to be immediately adjacent to the known caudate lesion. Assessment of the region is somewhat limited by motion artifact and lack of intravenous contrast on today's exam. There is some amorphous abnormal T1 signal in the porta hepatis which may represent metastatic spread. Gallbladder is markedly distended with layering tiny stones. Pancreas: No focal mass lesion. No dilatation of the main duct. No intraparenchymal cyst. No peripancreatic edema. Spleen: Scattered tiny T2 hyperintense foci in the splenic parenchyma are stable, likely benign. Adrenals/Urinary Tract: Right adrenal gland unremarkable. Left adrenal mass measures 3.0 x 2.7 cm today compared to 3.2 x 3.0 cm previously, not substantially changed. Stable tiny T2 hyperintensity in the interpolar right kidney, likely a cyst. Left kidney unremarkable. Stomach/Bowel: Stomach is unremarkable. No gastric wall thickening. No evidence of outlet obstruction. Duodenum is normally positioned as is the ligament of Treitz. No small bowel or colonic dilatation within the visualized abdomen. Vascular/Lymphatic: No abdominal aortic aneurysm. There is persistent flow signal in the portal vein although portal vein appears attenuated in the hepatoduodenal  ligament. Flow signal persists in the superior mesenteric vein and splenic vein. Other: Perihepatic ascites again noted with fluid in the paracolic gutter bilaterally, right greater than left. Body wall edema evident. Musculoskeletal: No overtly suspicious marrow signal abnormality within the visualized bony anatomy. IMPRESSION: 1. Markedly progressed intrahepatic biliary duct dilatation since 08/05/2020. Both the left and right hepatic ducts become obliterated in the region of the biliary confluence near the porta hepatis. There is some amorphous abnormal T1 signal in  the porta hepatis which may represent metastatic involvement although a discrete obstructing mass lesion cannot be discerned. Common bile duct in the head of pancreas is nondilated. 2. Persistent flow signal in the portal vein although portal vein appears attenuated in the hepatoduodenal ligament. 3. Cholelithiasis. Prominent gallbladder distension. Similar to 08/05/2020. 4. Similar appearance of the left adrenal mass measuring 3.0 x 2.7 cm today compared to 3.0 x 3.2 cm previously. 5. No substantial change in small volume ascites. 6. Small bilateral pleural effusions with bibasilar collapse/consolidation, right greater than left. Electronically Signed   By: Misty [place] M.D.   On: 09/27/2020 11:01   MR 3D Recon At Scanner  Result Date: 09/27/2020 CLINICAL DATA:  Jaundice.  Metastatic colon cancer with liver mets. EXAM: MRI ABDOMEN WITHOUT CONTRAST  (INCLUDING MRCP) TECHNIQUE: Multiplanar multisequence MR imaging of the abdomen was performed. Heavily T2-weighted images of the biliary and pancreatic ducts were obtained, and three-dimensional MRCP images were rendered by post processing. COMPARISON:  08/05/2020 FINDINGS: Lower chest: Small bilateral pleural effusions with bibasilar collapse/consolidation, right greater than left. Hepatobiliary: Intrahepatic biliary duct dilatation is markedly progressed since 08/05/2020. Biliary dilatation is  symmetric in the right and left hepatic lobes and both the right and left ductal anatomy becomes obliterated in the region of the ductal confluence at the porta hepatis. Common bile duct reconstitutes in the head of pancreas, nondilated at 6 mm diameter. The area of ductal termination does not appear to be immediately adjacent to the known caudate lesion. Assessment of the region is somewhat limited by motion artifact and lack of intravenous contrast on today's exam. There is some amorphous abnormal T1 signal in the porta hepatis which may represent metastatic spread. Gallbladder is markedly distended with layering tiny stones. Pancreas: No focal mass lesion. No dilatation of the main duct. No intraparenchymal cyst. No peripancreatic edema. Spleen: Scattered tiny T2 hyperintense foci in the splenic parenchyma are stable, likely benign. Adrenals/Urinary Tract: Right adrenal gland unremarkable. Left adrenal mass measures 3.0 x 2.7 cm today compared to 3.2 x 3.0 cm previously, not substantially changed. Stable tiny T2 hyperintensity in the interpolar right kidney, likely a cyst. Left kidney unremarkable. Stomach/Bowel: Stomach is unremarkable. No gastric wall thickening. No evidence of outlet obstruction. Duodenum is normally positioned as is the ligament of Treitz. No small bowel or colonic dilatation within the visualized abdomen. Vascular/Lymphatic: No abdominal aortic aneurysm. There is persistent flow signal in the portal vein although portal vein appears attenuated in the hepatoduodenal ligament. Flow signal persists in the superior mesenteric vein and splenic vein. Other: Perihepatic ascites again noted with fluid in the paracolic gutter bilaterally, right greater than left. Body wall edema evident. Musculoskeletal: No overtly suspicious marrow signal abnormality within the visualized bony anatomy. IMPRESSION: 1. Markedly progressed intrahepatic biliary duct dilatation since 08/05/2020. Both the left and right  hepatic ducts become obliterated in the region of the biliary confluence near the porta hepatis. There is some amorphous abnormal T1 signal in the porta hepatis which may represent metastatic involvement although a discrete obstructing mass lesion cannot be discerned. Common bile duct in the head of pancreas is nondilated. 2. Persistent flow signal in the portal vein although portal vein appears attenuated in the hepatoduodenal ligament. 3. Cholelithiasis. Prominent gallbladder distension. Similar to 08/05/2020. 4. Similar appearance of the left adrenal mass measuring 3.0 x 2.7 cm today compared to 3.0 x 3.2 cm previously. 5. No substantial change in small volume ascites. 6. Small bilateral pleural effusions with bibasilar collapse/consolidation, right greater  than left. Electronically Signed   By: Misty [place] M.D.   On: 09/27/2020 11:01        Scheduled Meds: . Chlorhexidine Gluconate Cloth  6 each Topical Daily  . heparin injection (subcutaneous)  5,000 Units Subcutaneous Q8H  . insulin aspart  0-9 Units Subcutaneous TID WC  . insulin detemir  16 Units Subcutaneous Daily  . metoprolol tartrate  12.5 mg Oral BID  . oxybutynin  20 mg Oral Daily  . pantoprazole  40 mg Oral Daily  . sodium chloride flush  3 mL Intravenous Q12H  . ursodiol  300 mg Oral BID   Continuous Infusions: . lactated ringers 75 mL/hr at 09/28/20 1700     LOS: 4 days    Time spent: 35 mins.More than 50% of that time was spent in counseling and/or coordination of care.      Shelly Coss, MD [facility] P11/17/2021, 8:25 AM

## 2020-09-29 NOTE — Progress Notes (Signed)
Palliative Medicine Team consult was received.   Currently, patient is off the floor for procedure.  Will plan for follow-up later this afternoon vs tomorrow for initial consult.   If there are urgent needs or questions please call [phone]. Thank you for consulting out team to assist with this patients care.  Micheline Rough, MD [facility] Palliative Medicine Team [phone]  NO CHARGE NOTE

## 2020-09-29 NOTE — Progress Notes (Signed)
Patient's ERCP went smoothly and the stent was easily and quickly placed and the patient was extubated but did not wake up from the sedation very quickly and was not taking deep breaths and after anesthesia tried to encourage breathing based on increased sedation and hypoxia they elected to repeat the intubation and I am trying to alert the hospital team of the situation

## 2020-09-29 NOTE — Progress Notes (Signed)
Pharmacy Antibiotic Note  Cassandra Allen is a 74 y.o. female admitted on 09/25/2020 with aspiration pneumonia.  Pharmacy has been consulted for Unasyn dosing. 11/17 pt had ERCP & extubated, then had respiratory distress, put on 6L O2, transfer to ICU/step down  AF, WBC WNL, Scr 2.28, CrCl ~ 20 ml/min  Plan: Unasyn 3 gm IV q12h   Height: 5\' 2"  (157.5 cm) Weight: 71.2 kg (156 lb 15.5 oz) IBW/kg (Calculated) : 50.1  Temp (24hrs), Avg:98 F (36.7 C), Min:97.6 F (36.4 C), Max:98.4 F (36.9 C)  Recent Labs  Lab 09/26/20 0714 09/27/20 0516 09/28/20 0631 09/29/20 0500  WBC 7.3 9.2 7.7 7.6  CREATININE 2.71* 2.63* 2.61* 2.28*  LATICACIDVEN 0.6  --   --   --     Estimated Creatinine Clearance: 20.3 mL/min (A) (by C-G formula based on SCr of 2.28 mg/dL (H)).    Allergies  Allergen Reactions  . Metformin And Related Nausea And Vomiting  . Prednisone Nausea And Vomiting   Antimicrobials this admission:  11/14 Zosyn >>11/16 11/17 cipro x 1 dose 11/17 unasyn >>  Dose adjustments this admission:  11/15 change to 2.25 q8h for CrCl < 20  Microbiology results:  11/14 UCx: multiple species F 11/16 covid/flu neg  Thank you for allowing pharmacy to be a part of this patient's care.  Eudelia Bunch, Pharm.D 09/29/2020 2:03 PM

## 2020-09-29 NOTE — Op Note (Signed)
[facility] Patient Name: Cassandra Allen Procedure Date: 09/29/2020 MRN: [id number] Attending MD: Clarene Essex , MD Date of Birth: [birth year] CSN: [id number] Age: 74 Admit Type: Inpatient Procedure:                ERCP Indications:              Abnormal MRCP, Jaundice, Elevated liver enzymes                            obstructive jaundice due to metastatic colon cancer Providers:                Clarene Essex, MD, Josie Dixon, RN, Cleda Daub,                            RN, Laverda Sorenson, Technician, Benetta Spar,                            Technician, Enrigue Catena, CRNA Referring MD:              Medicines:                General Anesthesia Complications:            No immediate complications. Estimated Blood Loss:     Estimated blood loss: none. Procedure:                Pre-Anesthesia Assessment:                           - Prior to the procedure, a History and Physical                            was performed, and patient medications and                            allergies were reviewed. The patient's tolerance of                            previous anesthesia was also reviewed. The risks                            and benefits of the procedure and the sedation                            options and risks were discussed with the patient.                            All questions were answered, and informed consent                            was obtained. Prior Anticoagulants: The patient has                            taken no previous anticoagulant or antiplatelet  agents. ASA Grade Assessment: III - A patient with                            severe systemic disease. After reviewing the risks                            and benefits, the patient was deemed in                            satisfactory condition to undergo the procedure.                           After obtaining informed consent, the scope was                             passed under direct vision. Throughout the                            procedure, the patient's blood pressure, pulse, and                            oxygen saturations were monitored continuously. The                            TJF-Q180V ([id number]) Olympus Duodenoscope was                            introduced through the mouth, and used to inject                            contrast into and used to cannulate the bile duct.                            The ERCP was accomplished without difficulty. The                            patient tolerated the procedure well. Scope In: Scope Out: Findings:      The major papilla was mildly congested. Deep selective cannulation was       readily obtained on the first attempt and there was no pancreatic duct       injection or wire advancement throughout the procedure and on initial       cholangiogram an obvious upper CBD stricture was seen and the wire was       advanced into the intrahepatics and we proceeded with a biliary       sphincterotomy was made with a Hydratome sphincterotome using ERBE       electrocautery. There was no post-sphincterotomy bleeding. We proceeded       with the sphincterotomy until we had adequate biliary drainage and could       get the fully bowed sphincterotome easily in and out of the duct and we       elected to place one 10 Fr by 6 cm uncovered metal stent with no       external flaps and no internal flaps was  placed 5.5 cm into the common       bile duct. Bile, necrotic tissue and sludge flowed through the stent.       The stent was in good position. The introducer and wire was removed and       the scope was removed and the patient tolerated the procedure well and       the procedure was terminated at this point Impression:               - The major papilla appeared mildly congested.                           - A biliary sphincterotomy was performed.                           - One uncovered metal stent was placed  into the                            common bile duct Across proximal CBD stricture Moderate Sedation:      Not Applicable - Patient had care per Anesthesia. Recommendation:           - Clear liquid diet for 6 hours. If doing well this                            evening may have soft solids- Continue present                            medications.                           - Return to GI clinic PRN. Follow liver tests back                            towards baseline and follow-up with oncology later                            this week or next week per their routine                           - Telephone GI clinic if symptomatic PRN.                           - Check liver enzymes (AST, ALT, alkaline                            phosphatase, bilirubin) and hemogram with white                            blood cell count and platelets in the morning. She                            may require transfusion before discharge Procedure Code(s):        --- Professional ---  09407, Endoscopic retrograde                            cholangiopancreatography (ERCP); with placement of                            endoscopic stent into biliary or pancreatic duct,                            including pre- and post-dilation and guide wire                            passage, when performed, including sphincterotomy,                            when performed, each stent Diagnosis Code(s):        --- Professional ---                           R17, Unspecified jaundice                           R74.8, Abnormal levels of other serum enzymes                           K83.8, Other specified diseases of biliary tract                           R93.2, Abnormal findings on diagnostic imaging of                            liver and biliary tract CPT copyright 2019 American Medical Association. All rights reserved. The codes documented in this report are preliminary and upon coder review may  be  revised to meet current compliance requirements. Clarene Essex, MD 09/29/2020 10:59:40 AM This report has been signed electronically. Number of Addenda: 0

## 2020-09-29 NOTE — Progress Notes (Signed)
Cassandra Allen 9:49 AM  Subjective: Patient seen and examined and discussed with my partner Dr. Cristina Gong her hospital computer chart reviewed and she is having some abdominal pain and the procedure was rediscussed  Objective: Vital signs stable afebrile no acute distress abdomen is mildly tender throughout probable ascites labs reviewed MRCP reviewed  Assessment: Obstructive jaundice  Plan: The risks benefits methods and success rate of ERCP was rediscussed with the patient and will proceed today with anesthesia assistance with further work-up and plans pending those findings  [facility]  office [phone] After 5PM or if no answer call [phone]

## 2020-09-29 NOTE — Anesthesia Procedure Notes (Signed)
Procedure Name: Intubation Date/Time: 09/29/2020 10:08 AM Performed by: Lissa Morales, CRNA Pre-anesthesia Checklist: Patient identified, Emergency Drugs available, Suction available and Patient being monitored Patient Re-evaluated:Patient Re-evaluated prior to induction Oxygen Delivery Method: Circle system utilized Preoxygenation: Pre-oxygenation with 100% oxygen Induction Type: IV induction Ventilation: Mask ventilation without difficulty Laryngoscope Size: Mac and 4 Grade View: Grade II Tube type: Oral Tube size: 7.5 mm Number of attempts: 1 Airway Equipment and Method: Stylet and Oral airway Placement Confirmation: ETT inserted through vocal cords under direct vision,  positive ETCO2 and breath sounds checked- equal and bilateral Secured at: 22 cm Tube secured with: Tape Dental Injury: Teeth and Oropharynx as per pre-operative assessment

## 2020-09-29 NOTE — Transfer of Care (Signed)
Immediate Anesthesia Transfer of Care Note  Patient: Cassandra Allen  Procedure(s) Performed: ENDOSCOPIC RETROGRADE CHOLANGIOPANCREATOGRAPHY (ERCP) (N/A ) SPHINCTEROTOMY BILIARY STENT PLACEMENT (N/A )  Patient Location: PACU  Anesthesia Type:General  Level of Consciousness: awake, sedated and patient cooperative  Airway & Oxygen Therapy: Patient Spontanous Breathing and Patient connected to face mask oxygen  Post-op Assessment: Report given to RN, Post -op Vital signs reviewed and stable and Patient moving all extremities X 4  Post vital signs: stable  Last Vitals:  Vitals Value Taken Time  BP 131/48 09/29/20 1220  Temp 36.4 C 09/29/20 1140  Pulse 73 09/29/20 1220  Resp 15 09/29/20 1220  SpO2 99 % 09/29/20 1220  Vitals shown include unvalidated device data.  Last Pain:  Vitals:   09/29/20 1140  TempSrc: Axillary  PainSc:          Complications: No complications documented.

## 2020-09-29 NOTE — Evaluation (Signed)
Clinical/Bedside Swallow Evaluation Patient Details  Name: Cassandra Allen MRN: [id number] Date of Birth: 1946-03-24  Today's Date: 09/29/2020 Time: SLP Start Time (ACUTE ONLY): 23 SLP Stop Time (ACUTE ONLY): 1745 SLP Time Calculation (min) (ACUTE ONLY): 32 min  Past Medical History:  Past Medical History:  Diagnosis Date  . Acute diastolic heart failure ([place]) 12/22/2018  . Arthritis   . Atrial fibrillation, chronic ([place]) 12/22/2018  . Cancer of left colon ([place]) 10/30/2018  . Cancer of sigmoid colon  12/27/2018  . Diabetes mellitus without complication ([place])   . Hypertension   . Obesity (BMI 30-39.9) 12/27/2018   Past Surgical History:  Past Surgical History:  Procedure Laterality Date  . COLONOSCOPY  10/2018   Dr [name].  Large cancer at splenic flexure,  Bulky sigmoid colon mass, Numerous polyps  . Intra-abdominal abscess drainage  01/31/2019   Abscess drainage grew Enterococcus faecalis  . IR CATHETER TUBE CHANGE  06/05/2019  . IR IMAGING GUIDED PORT INSERTION  11/20/2018  . IR RADIOLOGIST EVAL & MGMT  06/04/2019  . IR RADIOLOGIST EVAL & MGMT  06/17/2019  . IR RADIOLOGIST EVAL & MGMT  09/16/2019  . LAPAROTOMY N/A 12/27/2018   Procedure: LEFT SIGMOID COLECTOMY WITH HARTMANN POUCH AND END COLOSTOMY;  Surgeon: [name], MD;  Location: [facility] ORS;  Service: General;  Laterality: N/A;   HPI:  74 yo female adm to [facility] with jaundice, Pt has h/o colon cancer with liver metastasis.  She had undergone an ECRP today and required reintubation after due to decreased mentation and concern for respiratory status.  Pt admits to weight loss prior to admission - due to nausea/vomiting and pain with intake.  Pt denies dysphagia except for to steak *per son*, pt reports it lodges in her throat but she states she waits for it to clear.  Frequent throat clearing reported by pt and her son present.   Assessment / Plan / Recommendation Clinical Impression  Patient presents with functional oropharyngeal  swallow ability.  Baseline throat clearing noted with and without po intake.  Negative CN exam, however pt does appear with contusion on left hard palate, She denies discomfort.  Voice is marginally weak with pt able to sustain "ah" for approx 8 seconds.  No indication of aspiration with minimal po observed.  Pt declined more than a few bites/sips.  She denies pill dysphagia - advised if problematic to take them with applesauce.  SLP to sign off, no follow up needed.  Thanks for the consult. SLP Visit Diagnosis: Dysphagia, oropharyngeal phase (R13.12)    Aspiration Risk  Mild aspiration risk    Diet Recommendation Dysphagia 3 (Mech soft);Thin liquid   Liquid Administration via: Cup;Straw Medication Administration: Whole meds with liquid Supervision: Patient able to self feed Compensations: Minimize environmental distractions;Slow rate;Small sips/bites Postural Changes: Seated upright at 90 degrees;Remain upright for at least 30 minutes after po intake    Other  Recommendations Oral Care Recommendations: Oral care BID   Follow up Recommendations None      Frequency and Duration     n/a       Prognosis   n/a     Swallow Study   General Date of Onset: 09/29/20 HPI: 74 yo female adm to [facility] with jaundice, Pt has h/o colon cancer with liver metastasis.  She had undergone an ECRP today and required reintubation after due to decreased mentation and concern for respiratory status.  Pt admits to weight loss prior to admission - due to nausea/vomiting and  pain with intake.  Pt denies dysphagia except for to steak *per son*, pt reports it lodges in her throat but she states she waits for it to clear.  Frequent throat clearing reported by pt and her son present. Type of Study: Bedside Swallow Evaluation Previous Swallow Assessment: none Diet Prior to this Study: Thin liquids Temperature Spikes Noted: No Respiratory Status: Room air History of Recent Intubation: No Behavior/Cognition:  Alert;Cooperative;Pleasant mood Oral Cavity Assessment: Within Functional Limits Oral Care Completed by SLP: No Oral Cavity - Dentition: Adequate natural dentition (some dentition missing) Vision: Functional for self-feeding Self-Feeding Abilities: Able to feed self Patient Positioning: Upright in bed Baseline Vocal Quality: Low vocal intensity Volitional Cough: Strong Volitional Swallow: Able to elicit    Oral/Motor/Sensory Function Overall Oral Motor/Sensory Function: Within functional limits   Ice Chips Ice chips: Not tested   Thin Liquid Thin Liquid: Within functional limits Presentation: Cup;Straw    Nectar Thick Nectar Thick Liquid: Not tested   Honey Thick Honey Thick Liquid: Not tested   Puree Puree: Within functional limits Presentation: Self Fed;Spoon   Solid     Solid: Within functional limits Presentation: Self Fed;Spoon Other Comments: cracker bolus      Macario Golds 09/29/2020,6:12 PM  Kathleen Lime, MS [facility] SLP Acute Rehab Services Office [phone] Pager [phone]

## 2020-09-29 NOTE — Anesthesia Postprocedure Evaluation (Signed)
Anesthesia Post Note  Patient: Cassandra Allen  Procedure(s) Performed: ENDOSCOPIC RETROGRADE CHOLANGIOPANCREATOGRAPHY (ERCP) (N/A ) SPHINCTEROTOMY BILIARY STENT PLACEMENT (N/A )     Patient location during evaluation: PACU Anesthesia Type: General Level of consciousness: sedated Pain management: pain level controlled Vital Signs Assessment: post-procedure vital signs reviewed and stable Respiratory status: spontaneous breathing and respiratory function stable Cardiovascular status: stable Postop Assessment: no apparent nausea or vomiting Anesthetic complications: no   No complications documented.  Last Vitals:  Vitals:   09/29/20 1320 09/29/20 1330  BP: (!) 120/51 (!) 119/48  Pulse: 67 (!) 48  Resp: 16 17  Temp:    SpO2: 99% 100%    Last Pain:  Vitals:   09/29/20 1140  TempSrc: Axillary  PainSc:                  Agusta Hackenberg DANIEL

## 2020-09-29 NOTE — Progress Notes (Signed)
Upon arrival to recovery area s/p ERCP, pt unresponsive, not moving air/taking deep breaths. Attempted verbal/tactile stimulation,  O2 sats declined, oral airway placed by CRNA.  Assisted ventilation with bag/mask. Dr Tobias Alexander to bedside, determined PIV infiltrated, new IV #20g started Left wrist, additional anesthesia reversal agents given IV. Ventilation assisted with bag/mask, pt began spontaneous respiration, O2 sats 96-99% on 6L O2, absent breath sounds bilateral bases. Dr Johnnette Gourd to [facility], advised transfer to ICU/step down. Current VSS

## 2020-09-30 DIAGNOSIS — R17 Unspecified jaundice: Secondary | ICD-10-CM | POA: Diagnosis not present

## 2020-09-30 LAB — CBC WITH DIFFERENTIAL/PLATELET
Abs Immature Granulocytes: 0.15 10*3/uL — ABNORMAL HIGH (ref 0.00–0.07)
Basophils Absolute: 0.1 10*3/uL (ref 0.0–0.1)
Basophils Relative: 1 %
Eosinophils Absolute: 0.1 10*3/uL (ref 0.0–0.5)
Eosinophils Relative: 2 %
HCT: 22.8 % — ABNORMAL LOW (ref 36.0–46.0)
Hemoglobin: 7 g/dL — ABNORMAL LOW (ref 12.0–15.0)
Immature Granulocytes: 2 %
Lymphocytes Relative: 5 %
Lymphs Abs: 0.3 10*3/uL — ABNORMAL LOW (ref 0.7–4.0)
MCH: 28.5 pg (ref 26.0–34.0)
MCHC: 30.7 g/dL (ref 30.0–36.0)
MCV: 92.7 fL (ref 80.0–100.0)
Monocytes Absolute: 0.5 10*3/uL (ref 0.1–1.0)
Monocytes Relative: 8 %
Neutro Abs: 5.7 10*3/uL (ref 1.7–7.7)
Neutrophils Relative %: 82 %
Platelets: 234 10*3/uL (ref 150–400)
RBC: 2.46 MIL/uL — ABNORMAL LOW (ref 3.87–5.11)
RDW: 16.9 % — ABNORMAL HIGH (ref 11.5–15.5)
WBC: 6.9 10*3/uL (ref 4.0–10.5)
nRBC: 0 % (ref 0.0–0.2)

## 2020-09-30 LAB — GLUCOSE, CAPILLARY
Glucose-Capillary: 111 mg/dL — ABNORMAL HIGH (ref 70–99)
Glucose-Capillary: 187 mg/dL — ABNORMAL HIGH (ref 70–99)
Glucose-Capillary: 159 mg/dL — ABNORMAL HIGH (ref 70–99)
Glucose-Capillary: 172 mg/dL — ABNORMAL HIGH (ref 70–99)

## 2020-09-30 LAB — HEMOGLOBIN AND HEMATOCRIT, BLOOD
HCT: 28 % — ABNORMAL LOW (ref 36.0–46.0)
Hemoglobin: 9 g/dL — ABNORMAL LOW (ref 12.0–15.0)

## 2020-09-30 LAB — BASIC METABOLIC PANEL
Anion gap: 9 (ref 5–15)
BUN: 45 mg/dL — ABNORMAL HIGH (ref 8–23)
CO2: 25 mmol/L (ref 22–32)
Calcium: 8.3 mg/dL — ABNORMAL LOW (ref 8.9–10.3)
Chloride: 103 mmol/L (ref 98–111)
Creatinine, Ser: 2 mg/dL — ABNORMAL HIGH (ref 0.44–1.00)
GFR, Estimated: 26 mL/min — ABNORMAL LOW (ref >60)
Glucose, Bld: 176 mg/dL — ABNORMAL HIGH (ref 70–99)
Potassium: 3.6 mmol/L (ref 3.5–5.1)
Sodium: 137 mmol/L (ref 135–145)

## 2020-09-30 LAB — PREPARE RBC (CROSSMATCH)

## 2020-09-30 MED ORDER — SODIUM CHLORIDE 0.9% IV SOLUTION: Freq: Once | INTRAVENOUS | Status: AC

## 2020-09-30 MED ORDER — ORAL CARE MOUTH RINSE
15.0000 mL | Freq: Two times a day (BID) | OROMUCOSAL | Status: DC
  Administered 2020-09-30 – 2020-10-02 (×4): 15 mL via OROMUCOSAL

## 2020-09-30 MED ORDER — MORPHINE SULFATE (PF) 2 MG/ML IV SOLN
2.0000 mg | Freq: Once | INTRAVENOUS | Status: AC
  Administered 2020-09-30: 2 mg via INTRAVENOUS
  Filled 2020-09-30: qty 1

## 2020-09-30 MED ORDER — HYDROCORTISONE 1 % EX CREA
TOPICAL_CREAM | Freq: Three times a day (TID) | CUTANEOUS | Status: DC | PRN
  Administered 2020-09-30: 1 via TOPICAL
  Filled 2020-09-30: qty 28

## 2020-09-30 NOTE — TOC Progression Note (Addendum)
Transition of Care [facility]) - Progression Note    Patient Details  Name: Cassandra Allen MRN: [id number] Date of Birth: [birth year]  [facility] CM/SW Contact  Leeroy Cha, RN Phone Number: 09/30/2020, 7:45 AM  Clinical Narrative:    ERCP done on 111721, pt unresponsoive afterwards and transferred to icu/palliative care is involved, pt stable as of [id number]  [place] at 4l/min, awake and reaqctive. Pt may possibly be dcd on [id number] Theodoro Parma notified she gave number of lcsw to call-[phone]-Dan /will need to fax dc summary and after visit summary to [phone] Expected Discharge Plan: [place] Barriers to Discharge: Continued Medical Work up  Expected Discharge Plan and Services Expected Discharge Plan: [place]   Discharge Planning Services: CM Consult   Living arrangements for the past 2 months: Single Family Home                                       Social Determinants of Health (SDOH) Interventions    Readmission Risk Interventions Readmission Risk Prevention Plan 09/28/2020 03/13/2019  Transportation Screening Complete Complete  PCP or Specialist Appt within 3-5 Days Not Complete -  Not Complete comments Not ready for dc yet -  [place] or Home Care Consult Complete -  Social Work Consult for [place] Planning/Counseling Complete -  Palliative Care Screening Not Applicable -  Medication Review Press photographer) Complete Complete  PCP or Specialist appointment within 3-5 days of discharge - Complete  HRI or [place] - Not Complete  HRI or Home Care Consult Pt Refusal Comments - Plan for discharge to SNF.  SW Recovery Care/Counseling Consult - Complete  Palliative Care Screening - Not Applicable  Skilled Nursing Facility - Complete  Some recent data might be hidden

## 2020-09-30 NOTE — Progress Notes (Signed)
CRITICAL VALUE ALERT  Critical Value: hgb 7  Date & Time Notied: 09/30/20, 1040  Provider Notified: Dr. Tawanna Solo  Orders Received/Actions taken: Awaiting orders at this time.

## 2020-09-30 NOTE — Progress Notes (Signed)
Successful and technically straightforward ERCP with stent placement accomplished yesterday.  From reading Dr. Neill Loft note, it appears that the stricture was distal to the bifurcation of the biliary tree, therefore a single stent was able to be placed across it.  LFTs were not checked this morning but I have ordered them for tomorrow.  Hopefully by then we will see a downward trend in the patient's bilirubin level.  Hemoglobin is stable this morning.  There was no bleeding from the sphincterotomy, and the patient's INR yesterday was improved at 1.2 following a single dose of oral vitamin K.  The patient this morning is free of any abdominal pain or nausea, and feels hungry.  She actually looks more chipper and animated and happier than when I saw her 2 days ago.  Abdomen is nontender to palpation.  She did have some immediate postprocedural obtundation prompting reintubation yesterday following her procedure, but at this time, in the stepdown unit, she is extubated, very alert and talkative.  Impression: Metastatic colon cancer with biliary obstruction, status post stenting yesterday.  No evident complications from procedure.  Plan:   1.  Per speech therapy recommendations, I have ordered a dysphagia-3 diet for the patient.    2.  I have ordered liver chemistries for tomorrow.    3. We will sign off, but please call us if the patient's LFTs fail to improve over the next several days, or if there is any other way we can be of assistance in this patient's care.  Cleotis Nipper, M.D. Pager [phone] If no answer or after 5 PM call [phone]

## 2020-09-30 NOTE — Progress Notes (Signed)
Palliative care brief note  I met today with Cassandra Allen following return from her endoscopy.  At this point she desires to, "keep things just how they are and see how the next couple days go."  Continue current care. Partial code.  Full note to follow.   , MD [facility] Palliative Medicine Team [phone] 

## 2020-09-30 NOTE — Progress Notes (Signed)
Physical Therapy Treatment Patient Details Name: ERNEST ORR MRN: [id number] DOB: 02/12/1946 Today's Date: 09/30/2020    History of Present Illness 74 y.o. with left side colon cancer and presumed oligo liver metastasis, s/p chemo and left hemicolectomy due to bowel obstruction on 12/27/2018. Patient presented to OSH with jaundice for 1 week, with no significant pain. CT revealed biliary dutal dilation related to liver mets, patient was then transferred to [facility] for management.    PT Comments    Pt in bed on 2 lts nasal at 99% HR 58 and BP 138/38 (L LE).  Assisted to EOB.  General transfer comment: + 2 side by side assist and increased time to rise from elevated surface.  Feels "poorly" with increased ABD discomfort seated.  EOB BP 118/48 HR 59 and sats 96% RA.  Feels "okay" but tired and increased ABD discomfort.  Assisted with standing. Standing BP 96/36 with HR 76 and RA 91%.   General Gait Details: very limited amb distance due to increased c/o dizziness and MAX c/o weakness/fatigue and feeling poorly.  Recliner brought to patient and positioned to comfort.    Follow Up Recommendations  Home health PT;Supervision/Assistance - 24 hour     Equipment Recommendations  None recommended by PT    Recommendations for Other Services       Precautions / Restrictions Precautions Precautions: Fall    Mobility  Bed Mobility Overal bed mobility: Needs Assistance Bed Mobility: Supine to Sit     Supine to sit: Mod assist;+2 for safety/equipment     General bed mobility comments: increased time and use of bed pad to complete scooting to EOB  Transfers Overall transfer level: Needs assistance Equipment used: Rolling walker (2 wheeled) Transfers: Sit to/from [facility] Sit to Stand: Min assist;+2 safety/equipment Stand pivot transfers: Min assist;Mod assist;+2 safety/equipment       General transfer comment: + 2 side by side assist and increased time to rise  from elevated surface.  Feels "poorly" with increased ABD discomfort seated.  Ambulation/Gait Ambulation/Gait assistance: Min assist;+2 physical assistance;+2 safety/equipment Gait Distance (Feet): 3 Feet Assistive device: Rolling walker (2 wheeled) Gait Pattern/deviations: Step-through pattern;Decreased step length - right;Decreased step length - left;Decreased stride length;Trunk flexed Gait velocity: decr   General Gait Details: very limited amb distance due to increased c/o dizziness and MAX c/o weakness/fatigue and feeling poorly.   Stairs             Wheelchair Mobility    Modified Rankin (Stroke Patients Only)       Balance                                            Cognition Arousal/Alertness: Awake/alert Behavior During Therapy: WFL for tasks assessed/performed Overall Cognitive Status: Within Functional Limits for tasks assessed                                 General Comments: AxO x 2 feels poorly      Exercises      General Comments        Pertinent Vitals/Pain Pain Assessment: Faces Faces Pain Scale: Hurts even more Pain Location: upper ABD all across Pain Descriptors / Indicators: Constant;Grimacing;Pressure;Discomfort Pain Intervention(s): Monitored during session    Home Living  Prior Function            PT Goals (current goals can now be found in the care plan section) Progress towards PT goals: Progressing toward goals    Frequency    Min 3X/week      PT Plan Current plan remains appropriate    Co-evaluation              AM-PAC PT "6 Clicks" Mobility   Outcome Measure  Help needed turning from your back to your side while in a flat bed without using bedrails?: A Lot Help needed moving from lying on your back to sitting on the side of a flat bed without using bedrails?: A Lot Help needed moving to and from a bed to a chair (including a wheelchair)?: A  Lot Help needed standing up from a chair using your arms (e.g., wheelchair or bedside chair)?: A Lot Help needed to walk in hospital room?: A Lot Help needed climbing 3-5 steps with a railing? : Total 6 Click Score: 11    End of Session Equipment Utilized During Treatment: Gait belt Activity Tolerance: Patient limited by fatigue;Treatment limited secondary to medical complications (Comment) Patient left: in chair;with call bell/phone within reach Nurse Communication: Mobility status PT Visit Diagnosis: Muscle weakness (generalized) (M62.81);Difficulty in walking, not elsewhere classified (R26.2)     Time: 2778-2423 PT Time Calculation (min) (ACUTE ONLY): 25 min  Charges:  $Gait Training: 8-22 mins $Therapeutic Activity: 8-22 mins                     Rica Koyanagi  PTA Acute  Rehabilitation Services Pager      [phone] Office      [phone]

## 2020-09-30 NOTE — Progress Notes (Signed)
PROGRESS NOTE    Cassandra Allen  NGE:[id number] DOB: [birth year] DOA: 09/25/2020 PCP: No primary care provider on file.   Chief Complain: Jaundice, abdominal pain  Brief Narrative: Patient is a 74 year old female with past medical history of metastatic colon cancer to liver, adrenal gland status post colectomy/colostomy, chronic A. fib, diastolic congestive heart failure who presented to the emergency department with complaints of abdominal pain, nausea, vomiting, increased jaundice, loss of appetite.  She was recently admitted and discharged at this hospital for biliary obstruction secondary to hepatic metastasis compressing the biliary duct.  On presentation, elevated bilirubin in the range of 13.  CT imaging showed worsening biliary dilation worsening metastasis.  She was also found to have severe AKI with creatinine in the range of 2.7, elevated alkaline phosphatase.  Oncology, GI consulted.  Went MRCP during this hospitalization. Underwent ERCP on 09/29/20 with placement of biliary stent but she was in acute hypoxic respiratory failure and needed  to be moved to ICU.  Assessment & Plan:   Principal Problem:   Jaundice Active Problems:   Essential (primary) hypertension   Cancer of splenic flexure of colon   HLD (hyperlipidemia)   Atrial fibrillation, chronic (HCC)   CKD (chronic kidney disease) stage 3, GFR 30-59 ml/min (HCC)   Right upper quadrant pain/obstructive jaundice: She has history of metastatic colon cancer to liver/adrenal  gland.  She is a status post colostomy.  She has been started on Zosyn for the suspicion of cholangitis which has been D/Ced now.   Currently on gentle  IV fluids, continue antiemetics, analgesics. Underwent MRCP which  showed markedly progressed intrahepatic biliary duct dilatation since 08/05/2020 with suspicion for metastatic obstructive process. Continue ursodiol.  She has elevated liver enzymes.  Underwent biliary sphincterotomy with placement  of metal stent in the CBD on 09/29/20. Check CMP tomorrow  Acute hypoxic respiratory failure: Was intubated for ERCP but became hypoxic after extubation and had to be nearly intubated again after procedure.  Was noted to have thick secretions.  Currently saturation better with supplemental oxygen by nasal cannula.  Chest x-ray showed small left lower lobe opacity consistent with atelectasis versus aspiration pneumonia. Started on Unasyn. she is a DNI  AKI :   Renal ultrasound did not show any obstruction.  Creatinine on 09/02/2020 was 0.7.  Urine sodium is 104.  CT has shown infiltration of the cancer into the adrenal gland and possibly kidneys which could have played a role on this.  Kidney function has somewhat improved with IV fluids.  IV fluids stopped now.  Acute metabolic encephalopathy: Resolved, currently alert and awake.  She has memory impairment at baseline  Normocytic anemia: Hemoglobin in the range of 7.  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  Will give her a unit of PRBC today  Electrolyte imbalance: Continue to monitor and supplement  Hypertension:  BP soft.  Antihypertensives on hold.  Chronic A. fib: On metoprolol for rate control at home.  Not on anticoagulation.  Chronic diastolic congestive heart failure: Appeared dehydrated on presentation. Has some elevated BNP.  IV fluids stopped  Insulin-dependent diabetes mellitus: Continue current insulin .  Continue to monitor blood sugars  Failure to thrive/deconditioning/debility: Uses walker for ambulation at home.  PT consulted and recommended HH  Goals of care: Very deconditioned/debilitated female with metastatic colon cancer.  Now has developed obstructive jaundice,AKI.  Remains very weak.  She has very poor quality of life.  We recommend palliative approach/hospice.  I discussed with the son and gave the clear picture of her poor prognosis.  He is interested to know about hospice at  home.  I have consulted palliative care for assistance     DVT prophylaxis: Heparin Wiley Code Status:Partial code Family Communication: Called and discussed with the son on phone on 09/30/2020 Status is: Inpatient  Remains inpatient appropriate because:Inpatient level of care appropriate due to severity of illness   Dispo: The patient is from: Home              Anticipated d/c is to: Home vs home with hospice              Anticipated d/c date is: 2 days              Patient currently is not medically stable to d/c.     Consultants: Gi  Procedures:None  Antimicrobials:  Anti-infectives (From admission, onward)   Start     Dose/Rate Route Frequency Ordered Stop   09/30/20 0600  Ampicillin-Sulbactam (UNASYN) 3 g in sodium chloride 0.9 % 100 mL IVPB        3 g 200 mL/hr over 30 Minutes Intravenous Every 12 hours 09/29/20 1401     09/29/20 1500  Ampicillin-Sulbactam (UNASYN) 3 g in sodium chloride 0.9 % 100 mL IVPB        3 g 200 mL/hr over 30 Minutes Intravenous  Once 09/29/20 1401 09/29/20 1707   09/27/20 0200  piperacillin-tazobactam (ZOSYN) IVPB 2.25 g  Status:  Discontinued        2.25 g 100 mL/hr over 30 Minutes Intravenous Every 8 hours 09/26/20 2000 09/27/20 1620   09/26/20 0200  piperacillin-tazobactam (ZOSYN) IVPB 3.375 g  Status:  Discontinued        3.375 g 12.5 mL/hr over 240 Minutes Intravenous Every 8 hours 09/25/20 2356 09/26/20 1959      Subjective:   Patient seen and examined at the bedside this morning.  Looks more comfortable than yesterday.  Alert and awake.  Denies any abdomen, nausea or vomiting.  Denies shortness of breath or cough..  Objective: Vitals:   09/30/20 0600 09/30/20 0700 09/30/20 0741 09/30/20 0750  BP: (!) 151/41 (!) 124/101    Pulse: (!) 47 (!) 50 (!) 49 (!) 54  Resp: 18 14 19 20   Temp:      TempSrc:      SpO2: 100% 100% 100% 100%  Weight:      Height:        Intake/Output Summary (Last 24 hours) at 09/30/2020 0802 Last data  filed at 09/30/2020 0645 Gross per 24 hour  Intake 2653.34 ml  Output 1451 ml  Net 1202.34 ml   Filed Weights   09/27/20 0449 09/29/20 0601 09/29/20 0838  Weight: 72 kg 71.2 kg 71.2 kg    Examination:   General exam: Icterus, chronically ill looking, debilitated, deconditioned Respiratory system: Crackles on the left base Cardiovascular system: S1 & S2 heard, RRR. No JVD, murmurs, rubs, gallops or clicks. Gastrointestinal system: Abdomen is mildly distended, soft and nontender. No organomegaly or masses felt. Normal bowel sounds heard. Central nervous system: Alert and oriented. No focal neurological deficits. Extremities: No edema, no clubbing ,no cyanosis Skin: No rashes, lesions or ulcers, icteric   Data Reviewed: I have personally reviewed following labs and imaging studies  CBC: Recent Labs  Lab 09/26/20 0714 09/27/20 0516 09/28/20 0631 09/29/20 0500 09/29/20 1534  WBC 7.3 9.2 7.7 7.6 9.0  NEUTROABS  --  7.9*  6.3 6.2 7.8*  HGB 7.8* 7.2* 7.0* 7.1* 7.1*  HCT 25.7* 23.2* 22.7* 22.7* 22.9*  MCV 95.2 93.2 93.4 91.9 93.1  PLT 178 177 219 248 973   Basic Metabolic Panel: Recent Labs  Lab 09/26/20 0714 09/27/20 0516 09/28/20 0631 09/29/20 0500 09/29/20 1534  NA 137 133* 132* 135 136  K 3.2* 3.4* 3.6 3.6 3.8  CL 106 106 105 106 104  CO2 19* 18* 19* 21* 23  GLUCOSE 108* 126* 92 102* 126*  BUN 72* 62* 58* 48* 51*  CREATININE 2.71* 2.63* 2.61* 2.28* 2.06*  CALCIUM 8.0* 7.8* 8.0* 8.3* 8.3*  MG 1.5* 1.8  --   --   --   PHOS 4.4  --   --   --   --    GFR: Estimated Creatinine Clearance: 22.5 mL/min (A) (by C-G formula based on SCr of 2.06 mg/dL (H)). Liver Function Tests: Recent Labs  Lab 09/26/20 0714 09/27/20 0516 09/28/20 0631 09/29/20 0500  AST 73* 71* 57* 48*  ALT 69* 65* 57* 52*  ALKPHOS 625* 566* 475* 479*  BILITOT 9.8* 9.7* 8.9* 7.9*  PROT 4.9* 4.5* 4.4* 4.5*  ALBUMIN 2.0* 1.7* 1.7* 1.8*   Recent Labs  Lab 09/26/20 0714  LIPASE 25   Recent  Labs  Lab 09/26/20 0714  AMMONIA 29   Coagulation Profile: Recent Labs  Lab 09/26/20 0714 09/29/20 0500  INR 1.9* 1.2   Cardiac Enzymes: No results for input(s): CKTOTAL, CKMB, CKMBINDEX, TROPONINI in the last 168 hours. BNP (last 3 results) No results for input(s): PROBNP in the last 8760 hours. HbA1C: No results for input(s): HGBA1C in the last 72 hours. CBG: Recent Labs  Lab 09/29/20 0736 09/29/20 0906 09/29/20 1152 09/29/20 1644 09/29/20 2126  GLUCAP 76 73 131* 113* 235*   Lipid Profile: No results for input(s): CHOL, HDL, LDLCALC, TRIG, CHOLHDL, LDLDIRECT in the last 72 hours. Thyroid Function Tests: No results for input(s): TSH, T4TOTAL, FREET4, T3FREE, THYROIDAB in the last 72 hours. Anemia Panel: Recent Labs    09/28/20 0631  FERRITIN 577*  TIBC 130*  IRON 65   Sepsis Labs: Recent Labs  Lab 09/26/20 0714  LATICACIDVEN 0.6    Recent Results (from the past 240 hour(s))  Culture, Urine     Status: Abnormal   Collection Time: 09/26/20  1:07 AM   Specimen: Urine, Clean Catch  Result Value Ref Range Status   Specimen Description   Final    URINE, CLEAN CATCH Performed at [facility], [place] [address] [place], [place] [address]    Special Requests   Final    NONE Performed at [facility], [place] [address], [place], [place] [address]    Culture MULTIPLE SPECIES PRESENT, SUGGEST RECOLLECTION (A)  Final   Report Status 09/27/2020 FINAL  Final  Respiratory Panel by RT PCR (Flu A&B, Covid) - Nasopharyngeal Swab     Status: None   Collection Time: 09/28/20  3:36 PM   Specimen: Nasopharyngeal Swab  Result Value Ref Range Status   SARS Coronavirus 2 by RT PCR NEGATIVE NEGATIVE Final    Comment: (NOTE) SARS-CoV-2 target nucleic acids are NOT DETECTED.  The SARS-CoV-2 RNA is generally detectable in upper respiratoy specimens during the acute phase of infection. The lowest concentration of SARS-CoV-2 viral copies this  assay can detect is 131 copies/mL. A negative result does not preclude SARS-Cov-2 infection and should not be used as the sole basis for treatment or other patient management decisions.  A negative result may occur with  improper specimen collection/handling, submission of specimen other than nasopharyngeal swab, presence of viral mutation(s) within the areas targeted by this assay, and inadequate number of viral copies (<131 copies/mL). A negative result must be combined with clinical observations, patient history, and epidemiological information. The expected result is Negative.  Fact Sheet for Patients:  PinkCheek.be  Fact Sheet for Healthcare Providers:  GravelBags.it  This test is no t yet approved or cleared by the [place] FDA and  has been authorized for detection and/or diagnosis of SARS-CoV-2 by FDA under an Emergency Use Authorization (EUA). This EUA will remain  in effect (meaning this test can be used) for the duration of the COVID-19 declaration under Section 564(b)(1) of the Act, 21 U.S.C. section 360bbb-3(b)(1), unless the authorization is terminated or revoked sooner.     Influenza A by PCR NEGATIVE NEGATIVE Final   Influenza B by PCR NEGATIVE NEGATIVE Final    Comment: (NOTE) The Xpert Xpress SARS-CoV-2/FLU/RSV assay is intended as an aid in  the diagnosis of influenza from Nasopharyngeal swab specimens and  should not be used as a sole basis for treatment. Nasal washings and  aspirates are unacceptable for Xpert Xpress SARS-CoV-2/FLU/RSV  testing.  Fact Sheet for Patients: PinkCheek.be  Fact Sheet for Healthcare Providers: GravelBags.it  This test is not yet approved or cleared by the [place] FDA and  has been authorized for detection and/or diagnosis of SARS-CoV-2 by  FDA under an Emergency Use Authorization (EUA). This EUA will  remain  in effect (meaning this test can be used) for the duration of the  Covid-19 declaration under Section 564(b)(1) of the Act, 21  U.S.C. section 360bbb-3(b)(1), unless the authorization is  terminated or revoked. Performed at [facility], [place] [address] [place], [place] [address]   MRSA PCR Screening     Status: None   Collection Time: 09/29/20  2:12 PM   Specimen: Nasal Mucosa; Nasopharyngeal  Result Value Ref Range Status   MRSA by PCR NEGATIVE NEGATIVE Final    Comment:        The GeneXpert MRSA Assay (FDA approved for NASAL specimens only), is one component of a comprehensive MRSA colonization surveillance program. It is not intended to diagnose MRSA infection nor to guide or monitor treatment for MRSA infections. Performed at [facility], [place] [address] [place], [place] [address]          Radiology Studies: DG CHEST PORT 1 VIEW  Result Date: 09/29/2020 CLINICAL DATA:  Shortness of breath EXAM: PORTABLE CHEST 1 VIEW COMPARISON:  August 18, 2019 FINDINGS: Port-A-Cath tip is at the cavoatrial junction. No pneumothorax. There is a small left pleural effusion with ill-defined opacity left lower lung region. There is mild atelectatic change in the lower lung regions as well. Heart is upper normal in size with pulmonary vascularity normal. No adenopathy. There is aortic atherosclerosis. No bone lesions. IMPRESSION: Central catheter as described without pneumothorax. Small left pleural effusion. Ill-defined opacity left lower lobe which in part is felt to be due to atelectasis. Superimposed pneumonia and/or aspiration may be present. Bibasilar atelectasis present. Stable cardiac silhouette. Aortic Atherosclerosis (ICD10-I70.0). Electronically Signed   By: Lowella Grip III M.D.   On: 09/29/2020 13:20   DG ERCP  Result Date: 09/29/2020 CLINICAL DATA:  74 year old female with a history of obstructive jaundice EXAM: ERCP TECHNIQUE:  Multiple spot images obtained with the fluoroscopic device and submitted for interpretation post-procedure. FLUOROSCOPY TIME:  Fluoroscopy Time:  2 minutes 38 seconds COMPARISON:  MR 09/27/2020 FINDINGS: Limited intraoperative fluoroscopic spot images during ERCP. Initial image demonstrates the endoscope projecting over the upper abdomen. There is a safety wire in place. Subsequently there is retrograde infusion of contrast partially opacifying the extrahepatic biliary ducts. There is a narrowed segment in the common bile duct, not well opacified. Subsequently there is placement of a metallic biliary stent at the site of the stenosis. IMPRESSION: Limited images during ERCP demonstrates treatment of bile duct stenosis with placement of metallic biliary stent. Please refer to the dictated operative report for full details of intraoperative findings and procedure. Electronically Signed   By: Corrie Mckusick D.O.   On: 09/29/2020 11:10        Scheduled Meds: . Chlorhexidine Gluconate Cloth  6 each Topical Daily  . heparin injection (subcutaneous)  5,000 Units Subcutaneous Q8H  . insulin aspart  0-9 Units Subcutaneous TID WC  . insulin detemir  16 Units Subcutaneous Daily  . metoprolol tartrate  12.5 mg Oral BID  . oxybutynin  20 mg Oral Daily  . pantoprazole  40 mg Oral Daily  . sodium chloride flush  3 mL Intravenous Q12H  . ursodiol  300 mg Oral BID   Continuous Infusions: . sodium chloride    . ampicillin-sulbactam (UNASYN) IV Stopped (09/30/20 0645)  . lactated ringers 75 mL/hr at 09/30/20 0645     LOS: 5 days    Time spent: 35 mins.More than 50% of that time was spent in counseling and/or coordination of care.      Shelly Coss, MD [facility] P11/18/2021, 8:02 AM

## 2020-10-01 DIAGNOSIS — K831 Obstruction of bile duct: Secondary | ICD-10-CM | POA: Diagnosis not present

## 2020-10-01 DIAGNOSIS — C185 Malignant neoplasm of splenic flexure: Secondary | ICD-10-CM | POA: Diagnosis not present

## 2020-10-01 DIAGNOSIS — Z7189 Other specified counseling: Secondary | ICD-10-CM | POA: Diagnosis not present

## 2020-10-01 DIAGNOSIS — R17 Unspecified jaundice: Secondary | ICD-10-CM | POA: Diagnosis not present

## 2020-10-01 LAB — BPAM RBC
Blood Product Expiration Date: 202112042359
ISSUE DATE / TIME: 202111181139
Unit Type and Rh: 6200

## 2020-10-01 LAB — COMPREHENSIVE METABOLIC PANEL
ALT: 40 U/L (ref 0–44)
AST: 31 U/L (ref 15–41)
Albumin: 2.1 g/dL — ABNORMAL LOW (ref 3.5–5.0)
Alkaline Phosphatase: 433 U/L — ABNORMAL HIGH (ref 38–126)
Anion gap: 10 (ref 5–15)
BUN: 43 mg/dL — ABNORMAL HIGH (ref 8–23)
CO2: 24 mmol/L (ref 22–32)
Calcium: 8.1 mg/dL — ABNORMAL LOW (ref 8.9–10.3)
Chloride: 103 mmol/L (ref 98–111)
Creatinine, Ser: 2.05 mg/dL — ABNORMAL HIGH (ref 0.44–1.00)
GFR, Estimated: 25 mL/min — ABNORMAL LOW (ref >60)
Glucose, Bld: 228 mg/dL — ABNORMAL HIGH (ref 70–99)
Potassium: 3.9 mmol/L (ref 3.5–5.1)
Sodium: 137 mmol/L (ref 135–145)
Total Bilirubin: 3.8 mg/dL — ABNORMAL HIGH (ref 0.3–1.2)
Total Protein: 5.2 g/dL — ABNORMAL LOW (ref 6.5–8.1)

## 2020-10-01 LAB — GLUCOSE, CAPILLARY
Glucose-Capillary: 132 mg/dL — ABNORMAL HIGH (ref 70–99)
Glucose-Capillary: 189 mg/dL — ABNORMAL HIGH (ref 70–99)
Glucose-Capillary: 160 mg/dL — ABNORMAL HIGH (ref 70–99)
Glucose-Capillary: 189 mg/dL — ABNORMAL HIGH (ref 70–99)

## 2020-10-01 LAB — CBC WITH DIFFERENTIAL/PLATELET
Abs Immature Granulocytes: 0.33 10*3/uL — ABNORMAL HIGH (ref 0.00–0.07)
Basophils Absolute: 0 10*3/uL (ref 0.0–0.1)
Basophils Relative: 1 %
Eosinophils Absolute: 0.2 10*3/uL (ref 0.0–0.5)
Eosinophils Relative: 2 %
HCT: 29.3 % — ABNORMAL LOW (ref 36.0–46.0)
Hemoglobin: 9.2 g/dL — ABNORMAL LOW (ref 12.0–15.0)
Immature Granulocytes: 4 %
Lymphocytes Relative: 4 %
Lymphs Abs: 0.4 10*3/uL — ABNORMAL LOW (ref 0.7–4.0)
MCH: 29.5 pg (ref 26.0–34.0)
MCHC: 31.4 g/dL (ref 30.0–36.0)
MCV: 93.9 fL (ref 80.0–100.0)
Monocytes Absolute: 0.8 10*3/uL (ref 0.1–1.0)
Monocytes Relative: 10 %
Neutro Abs: 6.3 10*3/uL (ref 1.7–7.7)
Neutrophils Relative %: 79 %
Platelets: 270 10*3/uL (ref 150–400)
RBC: 3.12 MIL/uL — ABNORMAL LOW (ref 3.87–5.11)
RDW: 16.6 % — ABNORMAL HIGH (ref 11.5–15.5)
WBC: 8 10*3/uL (ref 4.0–10.5)
nRBC: 0 % (ref 0.0–0.2)

## 2020-10-01 LAB — TYPE AND SCREEN
ABO/RH(D): A POS
Antibody Screen: NEGATIVE
Unit division: 0

## 2020-10-01 MED ORDER — HYDROMORPHONE HCL 1 MG/ML IJ SOLN
0.5000 mg | Freq: Once | INTRAMUSCULAR | Status: AC
  Administered 2020-10-01: 0.5 mg via INTRAVENOUS
  Filled 2020-10-01: qty 1

## 2020-10-01 MED ORDER — AMOXICILLIN-POT CLAVULANATE 500-125 MG PO TABS
1.0000 | ORAL_TABLET | Freq: Two times a day (BID) | ORAL | Status: DC
  Administered 2020-10-01 – 2020-10-02 (×2): 500 mg via ORAL
  Filled 2020-10-01 (×2): qty 1

## 2020-10-01 MED ORDER — FENTANYL 25 MCG/HR TD PT72
1.0000 | MEDICATED_PATCH | TRANSDERMAL | Status: DC
  Administered 2020-10-01: 1 via TRANSDERMAL
  Filled 2020-10-01: qty 1

## 2020-10-01 MED ORDER — AMOXICILLIN-POT CLAVULANATE 875-125 MG PO TABS: 1.0000 | ORAL_TABLET | Freq: Two times a day (BID) | ORAL | Status: DC

## 2020-10-01 MED ORDER — OXYCODONE HCL 5 MG PO TABS
5.0000 mg | ORAL_TABLET | Freq: Four times a day (QID) | ORAL | Status: DC | PRN
  Administered 2020-10-01 – 2020-10-02 (×4): 5 mg via ORAL
  Filled 2020-10-01 (×4): qty 1

## 2020-10-01 NOTE — Progress Notes (Signed)
Daily Progress Note   Patient Name: Cassandra Allen       Date: 10/01/2020 DOB: Jun 06, 1946  Age: 74 y.o. MRN#: [id number] Attending Physician: Shelly Coss, MD Primary Care Physician: No primary care provider on file. Admit Date: 09/25/2020  Reason for Consultation/Follow-up: Establishing goals of care  Subjective: I saw and examined Cassandra Allen today.  She is sitting in bedside chair in no acute distress.  She reports feeling better than she did on admission and states that she feels she needs more time to see how she does prior to having further discussions with me regarding long-term goals of care.  She states that she relies on Dr. Burr Medico to help her make decisions and wants to follow-up with her to discuss her opinion on if she possibly benefit him further consideration for chemotherapy prior to having discussion about other care plans, including consideration for hospice.  Length of Stay: 6  Current Medications: Scheduled Meds:  . amoxicillin-clavulanate  1 tablet Oral BID  . Chlorhexidine Gluconate Cloth  6 each Topical Daily  . fentaNYL  1 patch Transdermal Q72H  . heparin injection (subcutaneous)  5,000 Units Subcutaneous Q8H  . insulin aspart  0-9 Units Subcutaneous TID WC  . insulin detemir  16 Units Subcutaneous Daily  . mouth rinse  15 mL Mouth Rinse BID  . metoprolol tartrate  12.5 mg Oral BID  . oxybutynin  20 mg Oral Daily  . pantoprazole  40 mg Oral Daily  . sodium chloride flush  3 mL Intravenous Q12H  . ursodiol  300 mg Oral BID    Continuous Infusions: . sodium chloride      PRN Meds: hydrocortisone cream, ondansetron **OR** ondansetron (ZOFRAN) IV, oxyCODONE  Physical Exam     General: Alert, awake, in no acute distress.  HEENT: No bruits, no  goiter, no JVD, + icterus Heart: Regular rate and rhythm. No murmur appreciated. Lungs: Good air movement, clear Abdomen: Soft, nontender, nondistended, positive bowel sounds.  Ext: No significant edema Skin: Warm and dry, jaundice present but improved from admission Neuro: Grossly intact, nonfocal.      Vital Signs: BP (!) 147/56 (BP Location: Right Arm)   Pulse 68   Temp 98.9 F (37.2 C) (Oral)   Resp 18   Ht 5\' 2"  (1.575 m)   Wt 71.2  kg   SpO2 97%   BMI 28.71 kg/m  SpO2: SpO2: 97 % O2 Device: O2 Device: Room Air O2 Flow Rate: O2 Flow Rate (L/min): 3 L/min  Intake/output summary:   Intake/Output Summary (Last 24 hours) at 10/01/2020 2014 Last data filed at 10/01/2020 1900 Gross per 24 hour  Intake 656 ml  Output 1175 ml  Net -519 ml   LBM: Last BM Date: 10/01/20 Baseline Weight: Weight: 66.6 kg Most recent weight: Weight: 71.2 kg       Palliative Assessment/Data:    Flowsheet Rows     Most Recent Value  Intake Tab  Referral Department Hospitalist  Unit at Time of Referral ICU  Palliative Care Primary Diagnosis Cancer  Date Notified 09/28/20  Palliative Care Type New Palliative care  Reason for referral Clarify Goals of Care  Date of Admission 09/25/20  Date first seen by Palliative Care 09/29/20  # of days Palliative referral response time 1 Day(s)  # of days IP prior to Palliative referral 3  Clinical Assessment  Palliative Performance Scale Score 40%  Psychosocial & Spiritual Assessment  Palliative Care Outcomes  Patient/Family meeting held? Yes  Who was at the meeting? Patient  Palliative Care Outcomes Clarified goals of care      Patient Active Problem List   Diagnosis Date Noted  . Jaundice 09/25/2020  . Biliary obstruction 08/27/2020  . Pleural effusion on left 03/14/2019  . Chronic kidney disease (CKD), stage II (mild) 03/11/2019  . Sepsis ([place]) 03/11/2019  . Abscess of abdominal cavity ([place]) 03/11/2019  . Type II diabetes mellitus with  renal manifestations ([place]) 03/11/2019  . Hypokalemia 03/11/2019  . Chronic diastolic CHF (congestive heart failure) ([place]) 03/11/2019  . Abnormal LFTs 03/11/2019  . Elevated troponin 03/11/2019  . Severe sepsis ([place]) 03/11/2019  . Fever 02/25/2019  . Atrial fibrillation with RVR ([place])   . Postoperative intra-abdominal abscess 01/31/2019  . Cancer of sigmoid colon  12/27/2018  . History of adenomatous polyp of colon 12/27/2018  . 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 CKD (chronic kidney disease) stage 3, GFR 30-59 ml/min (HCC) 12/27/2018  . Obesity (BMI 30-39.9) 12/27/2018  . Cholelithiasis 12/27/2018  . Choledocholithiasis by MRCP 12/27/2018  . Status post partial colectomy Feb 2020 12/27/2018  . Colonic obstruction due to metastatic colon cancer 12/26/2018  . Anticoagulant long-term use 12/26/2018  . HLD (hyperlipidemia) 12/22/2018  . Atrial fibrillation, chronic ([place]) 12/22/2018  . Depression, recurrent ([place]) 12/22/2018  . Chest pain 12/22/2018  . Port-A-Cath in place 12/04/2018  . Goals of care, counseling/discussion 11/22/2018  . GI bleed 11/02/2018  . Rectal bleed   . Cancer of splenic flexure of colon 10/30/2018  . Iron deficiency anemia 04/04/2018  . 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 Pain in right ankle and joints of right foot 01/10/2018  . Essential (primary) hypertension 03/25/2015  . Insulin dependent diabetes mellitus 03/25/2015    Palliative Care Assessment & Plan   Patient Profile: 74 y.o. 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  She had CT followed by MRCP that revealed worsening mets with biliary obstruction.  She underwent successful ERCP earlier today but did have respiratory issues during time coming out of sedation.  She is  currently awake and alert in ICU.  Palliative consulted for [place].    Assessment: Patient  Active Problem List   Diagnosis Date Noted  . Jaundice 09/25/2020  . Biliary obstruction 08/27/2020  . Pleural effusion on left 03/14/2019  . Chronic kidney disease (CKD), stage II (mild) 03/11/2019  . Sepsis ([place]) 03/11/2019  . Abscess of abdominal cavity ([place]) 03/11/2019  . Type II diabetes mellitus with renal manifestations ([place]) 03/11/2019  . Hypokalemia 03/11/2019  . Chronic diastolic CHF (congestive heart failure) ([place]) 03/11/2019  . Abnormal LFTs 03/11/2019  . Elevated troponin 03/11/2019  . Severe sepsis ([place]) 03/11/2019  . Fever 02/25/2019  . Atrial fibrillation with RVR ([place])   . Postoperative intra-abdominal abscess 01/31/2019  . Cancer of sigmoid colon  12/27/2018  . History of adenomatous polyp of colon 12/27/2018  . 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 CKD (chronic kidney disease) stage 3, GFR 30-59 ml/min (HCC) 12/27/2018  . Obesity (BMI 30-39.9) 12/27/2018  . Cholelithiasis 12/27/2018  . Choledocholithiasis by MRCP 12/27/2018  . Status post partial colectomy Feb 2020 12/27/2018  . Colonic obstruction due to metastatic colon cancer 12/26/2018  . Anticoagulant long-term use 12/26/2018  . HLD (hyperlipidemia) 12/22/2018  . Atrial fibrillation, chronic ([place]) 12/22/2018  . Depression, recurrent ([place]) 12/22/2018  . Chest pain 12/22/2018  . Port-A-Cath in place 12/04/2018  . Goals of care, counseling/discussion 11/22/2018  . GI bleed 11/02/2018  . Rectal bleed   . Cancer of splenic flexure of colon 10/30/2018  . Iron deficiency anemia 04/04/2018  . 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 Pain in right ankle and joints of right foot 01/10/2018  . Essential (primary) hypertension 03/25/2015  . Insulin dependent diabetes mellitus 03/25/2015    Recommendations/Plan:  Goals of care: Cassandra Allen  feels that she needs more time to see how her body responds to stent.  Her jaundice is improving from admission.  She would like to discuss care plan further with Dr. Burr Medico prior to making decisions regarding goals of care.  It appears she is getting close to discharge and recommend follow-up with oncology as an outpatient to further discuss next steps (unless Dr. Burr Medico sees her again and have further discussions prior to discharge - I will continue to shadow the chart in case this occurs).  She is also a patient of [facility] who will also be a great resource for her to continue discussion regarding goals of care based upon her clinical course over the next couple of weeks.   Code Status:    Code Status Orders  (From admission, onward)         Start     Ordered   09/28/20 1500  Limited resuscitation (code)  Continuous       Question Answer Comment  In the event of cardiac or respiratory ARREST: Initiate Code Blue, Call Rapid Response Yes   In the event of cardiac or respiratory ARREST: Perform CPR Yes   In the event of cardiac or respiratory ARREST: Perform Intubation/Mechanical Ventilation No   In the event of cardiac or respiratory ARREST: Use NIPPV/BiPAp only if indicated Yes   In the event of cardiac or respiratory ARREST: Administer ACLS medications if indicated Yes   In the event of cardiac or respiratory ARREST: Perform Defibrillation or Cardioversion if indicated Yes      09/28/20 1459        Code Status History    Date Active Date Inactive Code Status Order ID Comments User Context   09/25/2020 2256 09/28/2020 1459 Full  Code [id number]  Flora Lipps, MD Inpatient   08/27/2020 1722 09/03/2020 2211 Full Code [id number]  Harold Hedge, MD Inpatient   03/11/2019 0239 03/20/2019 1853 Full Code [id number]  Corey Harold, NP ED   03/11/2019 0104 03/11/2019 0239 Full Code [id number]  Ivor Costa, MD ED   01/31/2019 1801 02/09/2019 1735 Full Code [id number]  Elgergawy, Silver Huguenin, MD  Inpatient   12/26/2018 0419 01/10/2019 2238 Full Code [id number]  Phillips Grout, MD ED   12/22/2018 0024 12/23/2018 1740 Full Code [id number]  Ivor Costa, MD ED   11/02/2018 1701 11/03/2018 1654 Full Code [id number]  Elodia Florence., MD Inpatient   03/26/2015 0002 03/27/2015 1625 Full Code [id number]  Allyne Gee, MD ED   Advance Care Planning Activity       Prognosis:  Guarded  Discharge Planning:  Home with [place] was discussed with patient  Thank you for allowing the Palliative Medicine Team to assist in the care of this patient.   Total Time 20 Prolonged Time Billed No   Greater than 50%  of this time was spent counseling and coordinating care related to the above assessment and plan.  Micheline Rough, MD  Please contact Palliative Medicine Team phone at [phone] for questions and concerns.

## 2020-10-01 NOTE — TOC Progression Note (Signed)
Transition of Care [facility]) - Progression Note    Patient Details  Name: Cassandra Allen MRN: [id number] Date of Birth: 1946/03/10  [facility] CM/SW Contact  Leeroy Cha, RN Phone Number: 10/01/2020, 1:07 PM  Clinical Narrative:    tct-Dan Doy Mince at [phone]/orders for hhc given to voice mail with request to call me back at [phone]/[facility]/   Expected Discharge Plan: [place] Barriers to Discharge: Continued Medical Work up  Expected Discharge Plan and Services Expected Discharge Plan: [place]   Discharge Planning Services: CM Consult   Living arrangements for the past 2 months: Single Family Home                                       Social Determinants of Health (SDOH) Interventions    Readmission Risk Interventions Readmission Risk Prevention Plan 09/28/2020 03/13/2019  Transportation Screening Complete Complete  PCP or Specialist Appt within 3-5 Days Not Complete -  Not Complete comments Not ready for dc yet -  [place] or Home Care Consult Complete -  Social Work Consult for [place] Planning/Counseling Complete -  Palliative Care Screening Not Applicable -  Medication Review Press photographer) Complete Complete  PCP or Specialist appointment within 3-5 days of discharge - Complete  HRI or [place] - Not Complete  HRI or Home Care Consult Pt Refusal Comments - Plan for discharge to SNF.  SW Recovery Care/Counseling Consult - Complete  Palliative Care Screening - Not Applicable  Skilled Nursing Facility - Complete  Some recent data might be hidden

## 2020-10-01 NOTE — Progress Notes (Signed)
PROGRESS NOTE    Cassandra Allen  EAV:[id number] DOB: [birth year] DOA: 09/25/2020 PCP: No primary care provider on file.   Chief Complain: Jaundice, abdominal pain  Brief Narrative: Patient is a 74 year old female with past medical history of metastatic colon cancer to liver, adrenal gland status post colectomy/colostomy, chronic A. fib, diastolic congestive heart failure who presented to the emergency department with complaints of abdominal pain, nausea, vomiting, increased jaundice, loss of appetite.  She was recently admitted and discharged at this hospital for biliary obstruction secondary to hepatic metastasis compressing the biliary duct.  On presentation, elevated bilirubin in the range of 13.  CT imaging showed worsening biliary dilation worsening metastasis.  She was also found to have severe AKI with creatinine in the range of 2.7, elevated alkaline phosphatase.  Oncology, GI consulted.  Went MRCP during this hospitalization. Underwent ERCP on 09/29/20 with placement of biliary stent but she was in acute hypoxic respiratory failure and needed  to be moved to ICU.Now hemodynamically stable  Assessment & Plan:   Principal Problem:   Jaundice Active Problems:   Essential (primary) hypertension   Cancer of splenic flexure of colon   HLD (hyperlipidemia)   Atrial fibrillation, chronic (HCC)   CKD (chronic kidney disease) stage 3, GFR 30-59 ml/min (HCC)   Right upper quadrant pain/obstructive jaundice: She has history of metastatic colon cancer to liver/adrenal  gland.  She is a status post colostomy.  She has been started on Zosyn for the suspicion of cholangitis which has been D/Ced now.   Currently on gentle  IV fluids, continue antiemetics, analgesics. Underwent MRCP which  showed markedly progressed intrahepatic biliary duct dilatation since 08/05/2020 with suspicion for metastatic obstructive process. Continue ursodiol.  She had elevated liver enzymes which have improved.   Underwent biliary sphincterotomy with placement of metal stent in the CBD on 09/29/20.  Acute hypoxic respiratory failure: Was intubated for ERCP but became hypoxic after extubation and had to be nearly intubated again after procedure.  Was noted to have thick secretions.  Currently saturation better with supplemental oxygen by nasal cannula.  Chest x-ray showed small left lower lobe opacity consistent with atelectasis versus aspiration pneumonia. Started on Unasyn,changed to augmentin now. she is a DNI  AKI :   Renal ultrasound did not show any obstruction.  Creatinine on 09/02/2020 was 0.7.  Urine sodium is 104.  CT has shown infiltration of the cancer into the adrenal gland and possibly kidneys which could have played a role on this.  Kidney function has somewhat improved with IV fluids.  IV fluids stopped now.  This might be her new  baseline renal function.  Acute metabolic encephalopathy: Resolved, currently alert and awake.  She has memory impairment at baseline  Normocytic anemia: Hemoglobin in the range of 7.  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 S/P a unit of PRBC ,Hb now in the range of 9.  Electrolyte imbalance: Continue to monitor and supplement  Hypertension:  BP soft.  Antihypertensives on hold.  Chronic A. fib: On metoprolol for rate control at home.  Not on anticoagulation.  Chronic diastolic congestive heart failure: Appeared dehydrated on presentation. Has some elevated BNP.  IV fluids stopped  Insulin-dependent diabetes mellitus: Continue current insulin regimen .  Continue to monitor blood sugars  Failure to thrive/deconditioning/debility: Uses walker for ambulation at home.  PT consulted and recommended HH  Goals of care: Very deconditioned/debilitated female with metastatic colon cancer.  Now has developed obstructive  jaundice,AKI.  Remains very weak.  She has very poor quality of life.  We recommend palliative  approach/hospice.  I discussed with the son and gave the clear picture of her poor prognosis.  He is interested to know about hospice at home.  I have consulted palliative care for assistance.  I think hospice at home is appropriate for her.     DVT prophylaxis: Heparin [place] Code Status:Partial code Family Communication: Called and discussed with the son on phone on 09/30/2020 Status is: Inpatient  Remains inpatient appropriate because:Inpatient level of care appropriate due to severity of illness   Dispo: The patient is from: Home              Anticipated d/c is to: Home vs home health or  hospice              Anticipated d/c date is: tomorow              Patient currently is not medically stable to d/c.     Consultants: Gi  Procedures:None  Antimicrobials:  Anti-infectives (From admission, onward)   Start     Dose/Rate Route Frequency Ordered Stop   09/30/20 0600  Ampicillin-Sulbactam (UNASYN) 3 g in sodium chloride 0.9 % 100 mL IVPB        3 g 200 mL/hr over 30 Minutes Intravenous Every 12 hours 09/29/20 1401     09/29/20 1500  Ampicillin-Sulbactam (UNASYN) 3 g in sodium chloride 0.9 % 100 mL IVPB        3 g 200 mL/hr over 30 Minutes Intravenous  Once 09/29/20 1401 09/29/20 1707   09/27/20 0200  piperacillin-tazobactam (ZOSYN) IVPB 2.25 g  Status:  Discontinued        2.25 g 100 mL/hr over 30 Minutes Intravenous Every 8 hours 09/26/20 2000 09/27/20 1620   09/26/20 0200  piperacillin-tazobactam (ZOSYN) IVPB 3.375 g  Status:  Discontinued        3.375 g 12.5 mL/hr over 240 Minutes Intravenous Every 8 hours 09/25/20 2356 09/26/20 1959      Subjective:   Patient seen and examined the bedside this morning.  Hemodynamically stable.  Comfortable.  Denies any complaints.  Looks much better today.  Denies any shortness of breath, abdomen pain, nausea or vomiting.  Objective: Vitals:   10/01/20 0500 10/01/20 0600 10/01/20 0700 10/01/20 0731  BP: (!) 145/44 (!) 159/46 (!)  153/46   Pulse: (!) 49 60 64 61  Resp: 14 15 15 14   Temp:    98.9 F (37.2 C)  TempSrc:    Axillary  SpO2: 96% 96% 96% 97%  Weight:      Height:        Intake/Output Summary (Last 24 hours) at 10/01/2020 0735 Last data filed at 10/01/2020 0725 Gross per 24 hour  Intake 1894.76 ml  Output 2770 ml  Net -875.24 ml   Filed Weights   09/27/20 0449 09/29/20 0601 09/29/20 0838  Weight: 72 kg 71.2 kg 71.2 kg    Examination:  General exam: Overall comfortable, chronically looking female Respiratory system: Crackles on the left base Cardiovascular system: S1 & S2 heard, RRR. No JVD, murmurs, rubs, gallops or clicks. Gastrointestinal system: Abdomen is nondistended, soft and nontender. No organomegaly or masses felt. Normal bowel sounds heard. Central nervous system: Alert and oriented. No focal neurological deficits. Extremities: No edema, no clubbing ,no cyanosis Skin: No ulcers, improving icterus    Data Reviewed: I have personally reviewed following labs and imaging studies  CBC:  Recent Labs  Lab 09/28/20 0631 09/28/20 0631 09/29/20 0500 09/29/20 1534 09/30/20 0938 09/30/20 1649 10/01/20 0250  WBC 7.7  --  7.6 9.0 6.9  --  8.0  NEUTROABS 6.3  --  6.2 7.8* 5.7  --  6.3  HGB 7.0*   < > 7.1* 7.1* 7.0* 9.0* 9.2*  HCT 22.7*   < > 22.7* 22.9* 22.8* 28.0* 29.3*  MCV 93.4  --  91.9 93.1 92.7  --  93.9  PLT 219  --  248 255 234  --  270   < > = values in this interval not displayed.   Basic Metabolic Panel: Recent Labs  Lab 09/26/20 0714 09/26/20 0714 09/27/20 0516 09/27/20 0516 09/28/20 0631 09/29/20 0500 09/29/20 1534 09/30/20 0938 10/01/20 0250  NA 137   < > 133*   < > 132* 135 136 137 137  K 3.2*   < > 3.4*   < > 3.6 3.6 3.8 3.6 3.9  CL 106   < > 106   < > 105 106 104 103 103  CO2 19*   < > 18*   < > 19* 21* 23 25 24   GLUCOSE 108*   < > 126*   < > 92 102* 126* 176* 228*  BUN 72*   < > 62*   < > 58* 48* 51* 45* 43*  CREATININE 2.71*   < > 2.63*   < > 2.61*  2.28* 2.06* 2.00* 2.05*  CALCIUM 8.0*   < > 7.8*   < > 8.0* 8.3* 8.3* 8.3* 8.1*  MG 1.5*  --  1.8  --   --   --   --   --   --   PHOS 4.4  --   --   --   --   --   --   --   --    < > = values in this interval not displayed.   GFR: Estimated Creatinine Clearance: 22.6 mL/min (A) (by C-G formula based on SCr of 2.05 mg/dL (H)). Liver Function Tests: Recent Labs  Lab 09/26/20 0714 09/27/20 0516 09/28/20 0631 09/29/20 0500 10/01/20 0250  AST 73* 71* 57* 48* 31  ALT 69* 65* 57* 52* 40  ALKPHOS 625* 566* 475* 479* 433*  BILITOT 9.8* 9.7* 8.9* 7.9* 3.8*  PROT 4.9* 4.5* 4.4* 4.5* 5.2*  ALBUMIN 2.0* 1.7* 1.7* 1.8* 2.1*   Recent Labs  Lab 09/26/20 0714  LIPASE 25   Recent Labs  Lab 09/26/20 0714  AMMONIA 29   Coagulation Profile: Recent Labs  Lab 09/26/20 0714 09/29/20 0500  INR 1.9* 1.2   Cardiac Enzymes: No results for input(s): CKTOTAL, CKMB, CKMBINDEX, TROPONINI in the last 168 hours. BNP (last 3 results) No results for input(s): PROBNP in the last 8760 hours. HbA1C: No results for input(s): HGBA1C in the last 72 hours. CBG: Recent Labs  Lab 09/30/20 0825 09/30/20 1139 09/30/20 1637 09/30/20 2131 10/01/20 0730  GLUCAP 111* 187* 159* 172* 132*   Lipid Profile: No results for input(s): CHOL, HDL, LDLCALC, TRIG, CHOLHDL, LDLDIRECT in the last 72 hours. Thyroid Function Tests: No results for input(s): TSH, T4TOTAL, FREET4, T3FREE, THYROIDAB in the last 72 hours. Anemia Panel: No results for input(s): VITAMINB12, FOLATE, FERRITIN, TIBC, IRON, RETICCTPCT in the last 72 hours. Sepsis Labs: Recent Labs  Lab 09/26/20 1607  LATICACIDVEN 0.6    Recent Results (from the past 240 hour(s))  Culture, Urine     Status: Abnormal   Collection Time: 09/26/20  1:07 AM   Specimen: Urine, Clean Catch  Result Value Ref Range Status   Specimen Description   Final    URINE, CLEAN CATCH Performed at [facility], [place] [address], [place], [place]  [address]    Special Requests   Final    NONE Performed at [facility], [place] [address] [place], [place] [address]    Culture MULTIPLE SPECIES PRESENT, SUGGEST RECOLLECTION (A)  Final   Report Status 09/27/2020 FINAL  Final  Respiratory Panel by RT PCR (Flu A&B, Covid) - Nasopharyngeal Swab     Status: None   Collection Time: 09/28/20  3:36 PM   Specimen: Nasopharyngeal Swab  Result Value Ref Range Status   SARS Coronavirus 2 by RT PCR NEGATIVE NEGATIVE Final    Comment: (NOTE) SARS-CoV-2 target nucleic acids are NOT DETECTED.  The SARS-CoV-2 RNA is generally detectable in upper respiratoy specimens during the acute phase of infection. The lowest concentration of SARS-CoV-2 viral copies this assay can detect is 131 copies/mL. A negative result does not preclude SARS-Cov-2 infection and should not be used as the sole basis for treatment or other patient management decisions. A negative result may occur with  improper specimen collection/handling, submission of specimen other than nasopharyngeal swab, presence of viral mutation(s) within the areas targeted by this assay, and inadequate number of viral copies (<131 copies/mL). A negative result must be combined with clinical observations, patient history, and epidemiological information. The expected result is Negative.  Fact Sheet for Patients:  PinkCheek.be  Fact Sheet for Healthcare Providers:  GravelBags.it  This test is no t yet approved or cleared by the [place] FDA and  has been authorized for detection and/or diagnosis of SARS-CoV-2 by FDA under an Emergency Use Authorization (EUA). This EUA will remain  in effect (meaning this test can be used) for the duration of the COVID-19 declaration under Section 564(b)(1) of the Act, 21 U.S.C. section 360bbb-3(b)(1), unless the authorization is terminated or revoked sooner.     Influenza A by PCR  NEGATIVE NEGATIVE Final   Influenza B by PCR NEGATIVE NEGATIVE Final    Comment: (NOTE) The Xpert Xpress SARS-CoV-2/FLU/RSV assay is intended as an aid in  the diagnosis of influenza from Nasopharyngeal swab specimens and  should not be used as a sole basis for treatment. Nasal washings and  aspirates are unacceptable for Xpert Xpress SARS-CoV-2/FLU/RSV  testing.  Fact Sheet for Patients: PinkCheek.be  Fact Sheet for Healthcare Providers: GravelBags.it  This test is not yet approved or cleared by the [place] FDA and  has been authorized for detection and/or diagnosis of SARS-CoV-2 by  FDA under an Emergency Use Authorization (EUA). This EUA will remain  in effect (meaning this test can be used) for the duration of the  Covid-19 declaration under Section 564(b)(1) of the Act, 21  U.S.C. section 360bbb-3(b)(1), unless the authorization is  terminated or revoked. Performed at [facility], [place] [address] [place], [place] [address]   MRSA PCR Screening     Status: None   Collection Time: 09/29/20  2:12 PM   Specimen: Nasal Mucosa; Nasopharyngeal  Result Value Ref Range Status   MRSA by PCR NEGATIVE NEGATIVE Final    Comment:        The GeneXpert MRSA Assay (FDA approved for NASAL specimens only), is one component of a comprehensive MRSA colonization surveillance program. It is not intended to diagnose MRSA infection nor to guide or monitor treatment for MRSA  infections. Performed at [facility], [place] [address], [place], [place] [address]          Radiology Studies: DG CHEST PORT 1 VIEW  Result Date: 09/29/2020 CLINICAL DATA:  Shortness of breath EXAM: PORTABLE CHEST 1 VIEW COMPARISON:  August 18, 2019 FINDINGS: Port-A-Cath tip is at the cavoatrial junction. No pneumothorax. There is a small left pleural effusion with ill-defined opacity left lower lung region. There  is mild atelectatic change in the lower lung regions as well. Heart is upper normal in size with pulmonary vascularity normal. No adenopathy. There is aortic atherosclerosis. No bone lesions. IMPRESSION: Central catheter as described without pneumothorax. Small left pleural effusion. Ill-defined opacity left lower lobe which in part is felt to be due to atelectasis. Superimposed pneumonia and/or aspiration may be present. Bibasilar atelectasis present. Stable cardiac silhouette. Aortic Atherosclerosis (ICD10-I70.0). Electronically Signed   By: Lowella Grip III M.D.   On: 09/29/2020 13:20   DG ERCP  Result Date: 09/29/2020 CLINICAL DATA:  74 year old female with a history of obstructive jaundice EXAM: ERCP TECHNIQUE: Multiple spot images obtained with the fluoroscopic device and submitted for interpretation post-procedure. FLUOROSCOPY TIME:  Fluoroscopy Time:  2 minutes 38 seconds COMPARISON:  MR 09/27/2020 FINDINGS: Limited intraoperative fluoroscopic spot images during ERCP. Initial image demonstrates the endoscope projecting over the upper abdomen. There is a safety wire in place. Subsequently there is retrograde infusion of contrast partially opacifying the extrahepatic biliary ducts. There is a narrowed segment in the common bile duct, not well opacified. Subsequently there is placement of a metallic biliary stent at the site of the stenosis. IMPRESSION: Limited images during ERCP demonstrates treatment of bile duct stenosis with placement of metallic biliary stent. Please refer to the dictated operative report for full details of intraoperative findings and procedure. Electronically Signed   By: Corrie Mckusick D.O.   On: 09/29/2020 11:10        Scheduled Meds: . Chlorhexidine Gluconate Cloth  6 each Topical Daily  . heparin injection (subcutaneous)  5,000 Units Subcutaneous Q8H  . insulin aspart  0-9 Units Subcutaneous TID WC  . insulin detemir  16 Units Subcutaneous Daily  . mouth rinse   15 mL Mouth Rinse BID  . metoprolol tartrate  12.5 mg Oral BID  . oxybutynin  20 mg Oral Daily  . pantoprazole  40 mg Oral Daily  . sodium chloride flush  3 mL Intravenous Q12H  . ursodiol  300 mg Oral BID   Continuous Infusions: . sodium chloride    . ampicillin-sulbactam (UNASYN) IV Stopped (10/01/20 0607)     LOS: 6 days    Time spent: 35 mins.More than 50% of that time was spent in counseling and/or coordination of care.      Shelly Coss, MD [facility] P11/19/2021, 7:35 AM

## 2020-10-01 NOTE — TOC Progression Note (Signed)
Transition of Care [facility]) - Progression Note    Patient Details  Name: Cassandra Allen MRN: [id number] Date of Birth: 1946-07-06  Transition of Care [facility]) CM/SW Contact  Leeroy Cha, RN Phone Number: 10/01/2020, 1:57 PM  Clinical Narrative:    Pt may possibly be dcd on [phone] Theodoro [place] notified she gave number of lcsw to [name] /will need to fax dc summary and after visit summary to [phone] tct-son [name] will pick up patient when discharged.  Expected Discharge Plan: [place] Barriers to Discharge: Continued Medical Work up  Expected Discharge Plan and Services Expected Discharge Plan: [place]   Discharge Planning Services: CM Consult   Living arrangements for the past 2 months: Single Family Home                                       Social Determinants of Health (SDOH) Interventions    Readmission Risk Interventions Readmission Risk Prevention Plan 09/28/2020 03/13/2019  Transportation Screening Complete Complete  PCP or Specialist Appt within 3-5 Days Not Complete -  Not Complete comments Not ready for dc yet -  [place] or Home Care Consult Complete -  Social Work Consult for [employer] [place] Planning/Counseling Complete -  Palliative Care Screening Not Applicable -  Medication Review Press photographer) Complete Complete  PCP or Specialist appointment within 3-5 days of discharge - Complete  HRI or [place] - Not Complete  HRI or Home Care Consult Pt Refusal Comments - Plan for discharge to SNF.  SW Recovery Care/Counseling Consult - Complete  Palliative Care Screening - Not Applicable  Skilled Nursing Facility - Complete  Some recent data might be hidden

## 2020-10-01 NOTE — Progress Notes (Signed)
Report called to Marlou Starks RN. All questions answered at this time. All patient belongings and paper chart transported with pt. Pt taken upstairs in the bed on tele by RN and NT. 4W will continue to monitor pt.

## 2020-10-02 DIAGNOSIS — R17 Unspecified jaundice: Secondary | ICD-10-CM | POA: Diagnosis not present

## 2020-10-02 LAB — BASIC METABOLIC PANEL
Anion gap: 9 (ref 5–15)
BUN: 39 mg/dL — ABNORMAL HIGH (ref 8–23)
CO2: 26 mmol/L (ref 22–32)
Calcium: 7.8 mg/dL — ABNORMAL LOW (ref 8.9–10.3)
Chloride: 103 mmol/L (ref 98–111)
Creatinine, Ser: 1.7 mg/dL — ABNORMAL HIGH (ref 0.44–1.00)
GFR, Estimated: 31 mL/min — ABNORMAL LOW (ref >60)
Glucose, Bld: 132 mg/dL — ABNORMAL HIGH (ref 70–99)
Potassium: 3.4 mmol/L — ABNORMAL LOW (ref 3.5–5.1)
Sodium: 138 mmol/L (ref 135–145)

## 2020-10-02 LAB — HEPATIC FUNCTION PANEL
ALT: 35 U/L (ref 0–44)
AST: 30 U/L (ref 15–41)
Albumin: 2 g/dL — ABNORMAL LOW (ref 3.5–5.0)
Alkaline Phosphatase: 365 U/L — ABNORMAL HIGH (ref 38–126)
Bilirubin, Direct: 1.6 mg/dL — ABNORMAL HIGH (ref 0.0–0.2)
Indirect Bilirubin: 1.3 mg/dL — ABNORMAL HIGH (ref 0.3–0.9)
Total Bilirubin: 2.9 mg/dL — ABNORMAL HIGH (ref 0.3–1.2)
Total Protein: 4.8 g/dL — ABNORMAL LOW (ref 6.5–8.1)

## 2020-10-02 LAB — GLUCOSE, CAPILLARY
Glucose-Capillary: 99 mg/dL (ref 70–99)
Glucose-Capillary: 244 mg/dL — ABNORMAL HIGH (ref 70–99)

## 2020-10-02 MED ORDER — HYDROCORTISONE 1 % EX CREA: TOPICAL_CREAM | Freq: Three times a day (TID) | CUTANEOUS | 1 refills | Status: AC | PRN

## 2020-10-02 MED ORDER — AMOXICILLIN-POT CLAVULANATE 500-125 MG PO TABS: 1.0000 | ORAL_TABLET | Freq: Two times a day (BID) | ORAL | 0 refills | Status: AC

## 2020-10-02 MED ORDER — HEPARIN SOD (PORK) LOCK FLUSH 100 UNIT/ML IV SOLN
500.0000 [IU] | INTRAVENOUS | Status: AC | PRN
  Administered 2020-10-02: 500 [IU]
  Filled 2020-10-02: qty 5

## 2020-10-02 MED ORDER — POTASSIUM CHLORIDE CRYS ER 20 MEQ PO TBCR
40.0000 meq | EXTENDED_RELEASE_TABLET | Freq: Once | ORAL | Status: AC
  Administered 2020-10-02: 40 meq via ORAL
  Filled 2020-10-02: qty 2

## 2020-10-02 NOTE — Discharge Summary (Signed)
Physician Discharge Summary  Cassandra Allen DGL:[id number] DOB: 17-Oct-1946 DOA: 09/25/2020  PCP: No primary care provider on file.  Admit date: 09/25/2020 Discharge date: 10/02/2020  Admitted From: Home Disposition:  Home  Discharge Condition:Stable CODE STATUS:FULL Diet recommendation:  Dysphagia 3 diet  Brief/Interim Summary:  Patient is a 74 year old female with past medical history of metastatic colon cancer to liver, adrenal gland status post colectomy/colostomy, chronic A. fib, diastolic congestive heart failure who presented to the emergency department with complaints of abdominal pain, nausea, vomiting, increased jaundice, loss of appetite.  She was recently admitted and discharged at this hospital for biliary obstruction secondary to hepatic metastasis compressing the biliary duct.  On presentation, elevated bilirubin in the range of 13.  CT imaging showed worsening biliary dilation worsening metastasis.  She was also found to have severe AKI with creatinine in the range of 2.7, elevated alkaline phosphatase.  Oncology, GI consulted.  Went MRCP during this hospitalization. Underwent ERCP on 09/29/20 with placement of biliary stent.  Palliative care was also consulted during this hospitalization to discuss goals of care but she declined hospice approach.  She is medically stable for discharge to home today with home health.  Following problems were addressed during hospitalization:   Right upper quadrant pain/obstructive jaundice: She has history of metastatic colon cancer to liver/adrenal  gland.  She is a status post colostomy.  She has been started on Zosyn for the suspicion of cholangitis which has been D/Ced now.   Currently on gentle  IV fluids, continue antiemetics, analgesics. Underwent MRCP which  showed markedly progressed intrahepatic biliary duct dilatation since 08/05/2020 with suspicion for metastatic obstructive process. Continue ursodiol.  She had elevated liver  enzymes which have improved.  Underwent biliary sphincterotomy with placement of metal stent in the CBD on 09/29/20.  Acute hypoxic respiratory failure: Was intubated for ERCP but became hypoxic after extubation and had to be nearly intubated again after procedure.  Was noted to have thick secretions.  Currently saturation better with supplemental oxygen by nasal cannula.  Chest x-ray showed small left lower lobe opacity consistent with atelectasis versus aspiration pneumonia. Started on Unasyn,changed to augmentin now. she is a DNI  AKI :   Renal ultrasound did not show any obstruction.  Creatinine on 09/02/2020 was 0.7.  Urine sodium is 104.  CT has shown infiltration of the cancer into the adrenal gland and possibly kidneys which could have played a role on this.  Kidney function has somewhat improved with IV fluids.  IV fluids stopped now.  This might be her new  baseline renal function.  Follow-up BMP in a week  Acute metabolic encephalopathy: Resolved, currently alert and awake.  She has memory impairment at baseline  Normocytic anemia: Hemoglobin in the range of 7.  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 S/P a unit of PRBC ,Hb now in the range of 9.  Check CBC in a week  Electrolyte imbalance: Continue to monitor and supplement  Hypertension:   Resume home medications  Chronic A. fib: On metoprolol for rate control at home.  Not on anticoagulation.  Chronic diastolic congestive heart failure: Appeared dehydrated on presentation. Has some elevated BNP.    Insulin-dependent diabetes mellitus: Continue home insulin regimen .   Failure to thrive/deconditioning/debility: Uses walker for ambulation at home.  PT consulted and recommended HH  Goals of care: Very deconditioned/debilitated female with metastatic colon cancer.   Remains very weak.  She has very poor quality  of life.  We recommended palliative approach/hospice.  Palliative care  was consulted.  She did not want to enroll with hospice.  We recommend palliative care follow up  as an outpatient.  Discharge Diagnoses:  Principal Problem:   Jaundice Active Problems:   Essential (primary) hypertension   Cancer of splenic flexure of colon   HLD (hyperlipidemia)   Atrial fibrillation, chronic (HCC)   CKD (chronic kidney disease) stage 3, GFR 30-59 ml/min [facility])    Discharge Instructions  Discharge Instructions    Diet general   Complete by: As directed    Dysphagia 3 diet   Discharge instructions   Complete by: As directed    1)Please follow-up with your PCP in a week.  Do a CBC, BMP test during the follow-up 2)Take prescribed medications as instructed 3)Follow up with your oncologist as an outpatient. 4)Follow up with palliative care as an outpatient   Increase activity slowly   Complete by: As directed      Allergies as of 10/02/2020      Reactions   Metformin And Related Nausea And Vomiting   Prednisone Nausea And Vomiting      Medication List    STOP taking these medications   hydrocortisone 1 % lotion Replaced by: hydrocortisone cream 1 %   liraglutide 18 MG/3ML Sopn Commonly known as: VICTOZA     TAKE these medications   amLODipine 5 MG tablet Commonly known as: NORVASC Take 5 mg by mouth daily.   amoxicillin-clavulanate 500-125 MG tablet Commonly known as: AUGMENTIN Take 1 tablet (500 mg total) by mouth 2 (two) times daily for 3 days.   fentaNYL 25 MCG/HR Commonly known as: Dodge 1 patch onto the skin every 3 (three) days.   ferrous sulfate 325 (65 FE) MG tablet Commonly known as: KP Ferrous Sulfate Take 1 tablet (325 mg total) by mouth daily with breakfast.   hydrocortisone cream 1 % Apply topically 3 (three) times daily as needed for itching. Replaces: hydrocortisone 1 % lotion   insulin detemir 100 UNIT/ML FlexPen Commonly known as: LEVEMIR Inject 16 Units into the skin daily.   lidocaine 5 % Commonly known  as: LIDODERM Place 1 patch onto the skin daily. Remove & Discard patch within 12 hours or as directed by MD   methocarbamol 500 MG tablet Commonly known as: ROBAXIN Take 1 tablet (500 mg total) by mouth every 6 (six) hours as needed for muscle spasms.   metoprolol tartrate 25 MG tablet Commonly known as: LOPRESSOR Take 12.5 mg by mouth 2 (two) times daily.   oxybutynin 10 MG 24 hr tablet Commonly known as: DITROPAN-XL Take 1 tablet (10 mg total) by mouth daily.   oxyCODONE 5 MG immediate release tablet Commonly known as: Oxy IR/ROXICODONE Take 1 tablet (5 mg total) by mouth every 4 (four) hours as needed for moderate pain or severe pain.   pantoprazole 40 MG tablet Commonly known as: Protonix Take 1 tablet (40 mg total) by mouth daily.   QUEtiapine 25 MG tablet Commonly known as: SEROQUEL Take 12.5 mg by mouth at bedtime.   sertraline 100 MG tablet Commonly known as: ZOLOFT Take 100 mg by mouth in the morning and at bedtime.   simvastatin 10 MG tablet Commonly known as: ZOCOR Take 1 tablet (10 mg total) by mouth daily at 6 PM.   ursodiol 300 MG capsule Commonly known as: ACTIGALL Take 1 capsule (300 mg total) by mouth 2 (two) times daily.  [place], [place] Follow up.   Why: hhc will be done through [facility] Contact information: [place] [address] [phone]        Thompson Grayer, MD. Schedule an appointment as soon as possible for a visit in 1 week(s).   Specialty: Cardiology Contact information: [address] [place] [address] [phone]              Allergies  Allergen Reactions  . Metformin And Related Nausea And Vomiting  . Prednisone Nausea And Vomiting    Consultations:  Oncology,GI,palliaitve care   Procedures/Studies: US RENAL  Result Date: 09/26/2020 CLINICAL DATA:  Elevated creatinine. EXAM: RENAL / URINARY TRACT ULTRASOUND COMPLETE  COMPARISON:  Abdominal CT June 04, 2019, abdominal MRI August 05, 2020 FINDINGS: Right Kidney: Renal measurements: 11.0 x 6.5 x 7.0 = volume: 268 mL. Echogenicity within normal limits. No mass or hydronephrosis visualized. Left Kidney: Renal measurements: 11.0 x 6.2 x 6.8 cm = volume: 240 mL. Echogenicity within normal limits. No mass or hydronephrosis visualized. Bladder: Appears normal for degree of bladder distention. Other: Incidental finding of layering sludge and small gallstones within the neck of the gallbladder. There is no evidence of gallbladder wall thickening. IMPRESSION: 1. Normal appearance of the kidneys and urinary bladder. 2. Cholelithiasis and gallbladder sludge without evidence of acute cholecystitis. Electronically Signed   By: Fidela Salisbury M.D.   On: 09/26/2020 13:56   MR ABDOMEN MRCP WO CONTRAST  Result Date: 09/27/2020 CLINICAL DATA:  Jaundice.  Metastatic colon cancer with liver mets. EXAM: MRI ABDOMEN WITHOUT CONTRAST  (INCLUDING MRCP) TECHNIQUE: Multiplanar multisequence MR imaging of the abdomen was performed. Heavily T2-weighted images of the biliary and pancreatic ducts were obtained, and three-dimensional MRCP images were rendered by post processing. COMPARISON:  08/05/2020 FINDINGS: Lower chest: Small bilateral pleural effusions with bibasilar collapse/consolidation, right greater than left. Hepatobiliary: Intrahepatic biliary duct dilatation is markedly progressed since 08/05/2020. Biliary dilatation is symmetric in the right and left hepatic lobes and both the right and left ductal anatomy becomes obliterated in the region of the ductal confluence at the porta hepatis. Common bile duct reconstitutes in the head of pancreas, nondilated at 6 mm diameter. The area of ductal termination does not appear to be immediately adjacent to the known caudate lesion. Assessment of the region is somewhat limited by motion artifact and lack of intravenous contrast on today's exam.  There is some amorphous abnormal T1 signal in the porta hepatis which may represent metastatic spread. Gallbladder is markedly distended with layering tiny stones. Pancreas: No focal mass lesion. No dilatation of the main duct. No intraparenchymal cyst. No peripancreatic edema. Spleen: Scattered tiny T2 hyperintense foci in the splenic parenchyma are stable, likely benign. Adrenals/Urinary Tract: Right adrenal gland unremarkable. Left adrenal mass measures 3.0 x 2.7 cm today compared to 3.2 x 3.0 cm previously, not substantially changed. Stable tiny T2 hyperintensity in the interpolar right kidney, likely a cyst. Left kidney unremarkable. Stomach/Bowel: Stomach is unremarkable. No gastric wall thickening. No evidence of outlet obstruction. Duodenum is normally positioned as is the ligament of Treitz. No small bowel or colonic dilatation within the visualized abdomen. Vascular/Lymphatic: No abdominal aortic aneurysm. There is persistent flow signal in the portal vein although portal vein appears attenuated in the hepatoduodenal ligament. Flow signal persists in the superior mesenteric vein and splenic vein. Other: Perihepatic ascites again noted with fluid in the paracolic gutter bilaterally, right greater  than left. Body wall edema evident. Musculoskeletal: No overtly suspicious marrow signal abnormality within the visualized bony anatomy. IMPRESSION: 1. Markedly progressed intrahepatic biliary duct dilatation since 08/05/2020. Both the left and right hepatic ducts become obliterated in the region of the biliary confluence near the porta hepatis. There is some amorphous abnormal T1 signal in the porta hepatis which may represent metastatic involvement although a discrete obstructing mass lesion cannot be discerned. Common bile duct in the head of pancreas is nondilated. 2. Persistent flow signal in the portal vein although portal vein appears attenuated in the hepatoduodenal ligament. 3. Cholelithiasis. Prominent  gallbladder distension. Similar to 08/05/2020. 4. Similar appearance of the left adrenal mass measuring 3.0 x 2.7 cm today compared to 3.0 x 3.2 cm previously. 5. No substantial change in small volume ascites. 6. Small bilateral pleural effusions with bibasilar collapse/consolidation, right greater than left. Electronically Signed   By: Misty Stanley M.D.   On: 09/27/2020 11:01   MR 3D Recon At Scanner  Result Date: 09/27/2020 CLINICAL DATA:  Jaundice.  Metastatic colon cancer with liver mets. EXAM: MRI ABDOMEN WITHOUT CONTRAST  (INCLUDING MRCP) TECHNIQUE: Multiplanar multisequence MR imaging of the abdomen was performed. Heavily T2-weighted images of the biliary and pancreatic ducts were obtained, and three-dimensional MRCP images were rendered by post processing. COMPARISON:  08/05/2020 FINDINGS: Lower chest: Small bilateral pleural effusions with bibasilar collapse/consolidation, right greater than left. Hepatobiliary: Intrahepatic biliary duct dilatation is markedly progressed since 08/05/2020. Biliary dilatation is symmetric in the right and left hepatic lobes and both the right and left ductal anatomy becomes obliterated in the region of the ductal confluence at the porta hepatis. Common bile duct reconstitutes in the head of pancreas, nondilated at 6 mm diameter. The area of ductal termination does not appear to be immediately adjacent to the known caudate lesion. Assessment of the region is somewhat limited by motion artifact and lack of intravenous contrast on today's exam. There is some amorphous abnormal T1 signal in the porta hepatis which may represent metastatic spread. Gallbladder is markedly distended with layering tiny stones. Pancreas: No focal mass lesion. No dilatation of the main duct. No intraparenchymal cyst. No peripancreatic edema. Spleen: Scattered tiny T2 hyperintense foci in the splenic parenchyma are stable, likely benign. Adrenals/Urinary Tract: Right adrenal gland unremarkable.  Left adrenal mass measures 3.0 x 2.7 cm today compared to 3.2 x 3.0 cm previously, not substantially changed. Stable tiny T2 hyperintensity in the interpolar right kidney, likely a cyst. Left kidney unremarkable. Stomach/Bowel: Stomach is unremarkable. No gastric wall thickening. No evidence of outlet obstruction. Duodenum is normally positioned as is the ligament of Treitz. No small bowel or colonic dilatation within the visualized abdomen. Vascular/Lymphatic: No abdominal aortic aneurysm. There is persistent flow signal in the portal vein although portal vein appears attenuated in the hepatoduodenal ligament. Flow signal persists in the superior mesenteric vein and splenic vein. Other: Perihepatic ascites again noted with fluid in the paracolic gutter bilaterally, right greater than left. Body wall edema evident. Musculoskeletal: No overtly suspicious marrow signal abnormality within the visualized bony anatomy. IMPRESSION: 1. Markedly progressed intrahepatic biliary duct dilatation since 08/05/2020. Both the left and right hepatic ducts become obliterated in the region of the biliary confluence near the porta hepatis. There is some amorphous abnormal T1 signal in the porta hepatis which may represent metastatic involvement although a discrete obstructing mass lesion cannot be discerned. Common bile duct in the head of pancreas is nondilated. 2. Persistent flow signal in the portal vein  although portal vein appears attenuated in the hepatoduodenal ligament. 3. Cholelithiasis. Prominent gallbladder distension. Similar to 08/05/2020. 4. Similar appearance of the left adrenal mass measuring 3.0 x 2.7 cm today compared to 3.0 x 3.2 cm previously. 5. No substantial change in small volume ascites. 6. Small bilateral pleural effusions with bibasilar collapse/consolidation, right greater than left. Electronically Signed   By: Misty Stanley M.D.   On: 09/27/2020 11:01   DG CHEST PORT 1 VIEW  Result Date:  09/29/2020 CLINICAL DATA:  Shortness of breath EXAM: PORTABLE CHEST 1 VIEW COMPARISON:  August 18, 2019 FINDINGS: Port-A-Cath tip is at the cavoatrial junction. No pneumothorax. There is a small left pleural effusion with ill-defined opacity left lower lung region. There is mild atelectatic change in the lower lung regions as well. Heart is upper normal in size with pulmonary vascularity normal. No adenopathy. There is aortic atherosclerosis. No bone lesions. IMPRESSION: Central catheter as described without pneumothorax. Small left pleural effusion. Ill-defined opacity left lower lobe which in part is felt to be due to atelectasis. Superimposed pneumonia and/or aspiration may be present. Bibasilar atelectasis present. Stable cardiac silhouette. Aortic Atherosclerosis (ICD10-I70.0). Electronically Signed   By: Lowella Grip III M.D.   On: 09/29/2020 13:20   DG ERCP  Result Date: 09/29/2020 CLINICAL DATA:  74 year old female with a history of obstructive jaundice EXAM: ERCP TECHNIQUE: Multiple spot images obtained with the fluoroscopic device and submitted for interpretation post-procedure. FLUOROSCOPY TIME:  Fluoroscopy Time:  2 minutes 38 seconds COMPARISON:  MR 09/27/2020 FINDINGS: Limited intraoperative fluoroscopic spot images during ERCP. Initial image demonstrates the endoscope projecting over the upper abdomen. There is a safety wire in place. Subsequently there is retrograde infusion of contrast partially opacifying the extrahepatic biliary ducts. There is a narrowed segment in the common bile duct, not well opacified. Subsequently there is placement of a metallic biliary stent at the site of the stenosis. IMPRESSION: Limited images during ERCP demonstrates treatment of bile duct stenosis with placement of metallic biliary stent. Please refer to the dictated operative report for full details of intraoperative findings and procedure. Electronically Signed   By: Corrie Mckusick D.O.   On: 09/29/2020  11:10       Subjective: Patient seen and examined at the bedside this morning.  Hemodynamically stable for discharge today.  Discharge Exam: Vitals:   10/02/20 0240 10/02/20 0949  BP: (!) 152/61 (!) 158/53  Pulse: 65 (!) 52  Resp: 16 20  Temp: 98 F (36.7 C) 98.8 F (37.1 C)  SpO2: 96% 99%   Vitals:   10/01/20 2009 10/01/20 2225 10/02/20 0240 10/02/20 0949  BP: (!) 147/56 126/61 (!) 152/61 (!) 158/53  Pulse: 68 69 65 (!) 52  Resp: 18 18 16 20   Temp: 98.9 F (37.2 C) 99.1 F (37.3 C) 98 F (36.7 C) 98.8 F (37.1 C)  TempSrc: Oral Oral Oral Oral  SpO2: 97% 98% 96% 99%  Weight:      Height:        General: Pt is alert, awake, not in acute distress Cardiovascular: RRR, S1/S2 +, no rubs, no gallops Respiratory: CTA bilaterally, no wheezing, no rhonchi Abdominal: Soft, NT, ND, bowel sounds + Extremities: no edema, no cyanosis    The results of significant diagnostics from this hospitalization (including imaging, microbiology, ancillary and laboratory) are listed below for reference.     Microbiology: Recent Results (from the past 240 hour(s))  Culture, Urine     Status: Abnormal   Collection Time: 09/26/20  1:07 AM   Specimen: Urine, Clean Catch  Result Value Ref Range Status   Specimen Description   Final    URINE, CLEAN CATCH Performed at [facility], [place] [address] [place], [place] [address]    Special Requests   Final    NONE Performed at [facility], [place] [address], [place], [place] [address]    Culture MULTIPLE SPECIES PRESENT, SUGGEST RECOLLECTION (A)  Final   Report Status 09/27/2020 FINAL  Final  Respiratory Panel by RT PCR (Flu A&B, Covid) - Nasopharyngeal Swab     Status: None   Collection Time: 09/28/20  3:36 PM   Specimen: Nasopharyngeal Swab  Result Value Ref Range Status   SARS Coronavirus 2 by RT PCR NEGATIVE NEGATIVE Final    Comment: (NOTE) SARS-CoV-2 target nucleic acids are NOT DETECTED.  The  SARS-CoV-2 RNA is generally detectable in upper respiratoy specimens during the acute phase of infection. The lowest concentration of SARS-CoV-2 viral copies this assay can detect is 131 copies/mL. A negative result does not preclude SARS-Cov-2 infection and should not be used as the sole basis for treatment or other patient management decisions. A negative result may occur with  improper specimen collection/handling, submission of specimen other than nasopharyngeal swab, presence of viral mutation(s) within the areas targeted by this assay, and inadequate number of viral copies (<131 copies/mL). A negative result must be combined with clinical observations, patient history, and epidemiological information. The expected result is Negative.  Fact Sheet for Patients:  PinkCheek.be  Fact Sheet for Healthcare Providers:  GravelBags.it  This test is no t yet approved or cleared by the [place] FDA and  has been authorized for detection and/or diagnosis of SARS-CoV-2 by FDA under an Emergency Use Authorization (EUA). This EUA will remain  in effect (meaning this test can be used) for the duration of the COVID-19 declaration under Section 564(b)(1) of the Act, 21 U.S.C. section 360bbb-3(b)(1), unless the authorization is terminated or revoked sooner.     Influenza A by PCR NEGATIVE NEGATIVE Final   Influenza B by PCR NEGATIVE NEGATIVE Final    Comment: (NOTE) The Xpert Xpress SARS-CoV-2/FLU/RSV assay is intended as an aid in  the diagnosis of influenza from Nasopharyngeal swab specimens and  should not be used as a sole basis for treatment. Nasal washings and  aspirates are unacceptable for Xpert Xpress SARS-CoV-2/FLU/RSV  testing.  Fact Sheet for Patients: PinkCheek.be  Fact Sheet for Healthcare Providers: GravelBags.it  This test is not yet approved or cleared  by the [place] FDA and  has been authorized for detection and/or diagnosis of SARS-CoV-2 by  FDA under an Emergency Use Authorization (EUA). This EUA will remain  in effect (meaning this test can be used) for the duration of the  Covid-19 declaration under Section 564(b)(1) of the Act, 21  U.S.C. section 360bbb-3(b)(1), unless the authorization is  terminated or revoked. Performed at [facility], [place] [address] [place], [place] [address]   MRSA PCR Screening     Status: None   Collection Time: 09/29/20  2:12 PM   Specimen: Nasal Mucosa; Nasopharyngeal  Result Value Ref Range Status   MRSA by PCR NEGATIVE NEGATIVE Final    Comment:        The GeneXpert MRSA Assay (FDA approved for NASAL specimens only), is one component of a comprehensive MRSA colonization surveillance program. It is not intended to diagnose MRSA infection nor to guide or monitor treatment for MRSA  infections. Performed at [facility], [place] [address], [place], [place] [address]      Labs: BNP (last 3 results) Recent Labs    09/28/20 1537  BNP 211.9*   Basic Metabolic Panel: Recent Labs  Lab 09/26/20 0714 09/26/20 0714 09/27/20 0516 09/28/20 0631 09/29/20 0500 09/29/20 1534 09/30/20 0938 10/01/20 0250 10/02/20 0414  NA 137   < > 133*   < > 135 136 137 137 138  K 3.2*   < > 3.4*   < > 3.6 3.8 3.6 3.9 3.4*  CL 106   < > 106   < > 106 104 103 103 103  CO2 19*   < > 18*   < > 21* 23 25 24 26   GLUCOSE 108*   < > 126*   < > 102* 126* 176* 228* 132*  BUN 72*   < > 62*   < > 48* 51* 45* 43* 39*  CREATININE 2.71*   < > 2.63*   < > 2.28* 2.06* 2.00* 2.05* 1.70*  CALCIUM 8.0*   < > 7.8*   < > 8.3* 8.3* 8.3* 8.1* 7.8*  MG 1.5*  --  1.8  --   --   --   --   --   --   PHOS 4.4  --   --   --   --   --   --   --   --    < > = values in this interval not displayed.   Liver Function Tests: Recent Labs  Lab 09/26/20 0714 09/27/20 0516 09/28/20 0631  09/29/20 0500 10/01/20 0250  AST 73* 71* 57* 48* 31  ALT 69* 65* 57* 52* 40  ALKPHOS 625* 566* 475* 479* 433*  BILITOT 9.8* 9.7* 8.9* 7.9* 3.8*  PROT 4.9* 4.5* 4.4* 4.5* 5.2*  ALBUMIN 2.0* 1.7* 1.7* 1.8* 2.1*   Recent Labs  Lab 09/26/20 0714  LIPASE 25   Recent Labs  Lab 09/26/20 0714  AMMONIA 29   CBC: Recent Labs  Lab 09/28/20 0631 09/28/20 0631 09/29/20 0500 09/29/20 1534 09/30/20 0938 09/30/20 1649 10/01/20 0250  WBC 7.7  --  7.6 9.0 6.9  --  8.0  NEUTROABS 6.3  --  6.2 7.8* 5.7  --  6.3  HGB 7.0*   < > 7.1* 7.1* 7.0* 9.0* 9.2*  HCT 22.7*   < > 22.7* 22.9* 22.8* 28.0* 29.3*  MCV 93.4  --  91.9 93.1 92.7  --  93.9  PLT 219  --  248 255 234  --  270   < > = values in this interval not displayed.   Cardiac Enzymes: No results for input(s): CKTOTAL, CKMB, CKMBINDEX, TROPONINI in the last 168 hours. BNP: Invalid input(s): POCBNP CBG: Recent Labs  Lab 10/01/20 0730 10/01/20 1145 10/01/20 1612 10/01/20 2221 10/02/20 0737  GLUCAP 132* 189* 160* 189* 99   D-Dimer No results for input(s): DDIMER in the last 72 hours. Hgb A1c No results for input(s): HGBA1C in the last 72 hours. Lipid Profile No results for input(s): CHOL, HDL, LDLCALC, TRIG, CHOLHDL, LDLDIRECT in the last 72 hours. Thyroid function studies No results for input(s): TSH, T4TOTAL, T3FREE, THYROIDAB in the last 72 hours.  Invalid input(s): FREET3 Anemia work up No results for input(s): VITAMINB12, FOLATE, FERRITIN, TIBC, IRON, RETICCTPCT in the last 72 hours. Urinalysis    Component Value Date/Time   COLORURINE AMBER (A) 03/10/2019 0041   APPEARANCEUR CLEAR 03/10/2019 0041   LABSPEC 1.032 (H)  03/10/2019 0041   PHURINE 5.0 03/10/2019 0041   GLUCOSEU NEGATIVE 03/10/2019 0041   HGBUR SMALL (A) 03/10/2019 0041   BILIRUBINUR NEGATIVE 03/10/2019 0041   KETONESUR NEGATIVE 03/10/2019 0041   PROTEINUR 30 (A) 03/10/2019 0041   UROBILINOGEN 0.2 03/25/2015 2317   NITRITE POSITIVE (A) 03/10/2019  0041   LEUKOCYTESUR MODERATE (A) 03/10/2019 0041   Sepsis Labs Invalid input(s): PROCALCITONIN,  WBC,  LACTICIDVEN Microbiology Recent Results (from the past 240 hour(s))  Culture, Urine     Status: Abnormal   Collection Time: 09/26/20  1:07 AM   Specimen: Urine, Clean Catch  Result Value Ref Range Status   Specimen Description   Final    URINE, CLEAN CATCH Performed at [facility], [place] [address] [place], [place] [address]    Special Requests   Final    NONE Performed at [facility], [place] [address], [place],  [address]    Culture MULTIPLE SPECIES PRESENT, SUGGEST RECOLLECTION (A)  Final   Report Status 09/27/2020 FINAL  Final  Respiratory Panel by RT PCR (Flu A&B, Covid) - Nasopharyngeal Swab     Status: None   Collection Time: 09/28/20  3:36 PM   Specimen: Nasopharyngeal Swab  Result Value Ref Range Status   SARS Coronavirus 2 by RT PCR NEGATIVE NEGATIVE Final    Comment: (NOTE) SARS-CoV-2 target nucleic acids are NOT DETECTED.  The SARS-CoV-2 RNA is generally detectable in upper respiratoy specimens during the acute phase of infection. The lowest concentration of SARS-CoV-2 viral copies this assay can detect is 131 copies/mL. A negative result does not preclude SARS-Cov-2 infection and should not be used as the sole basis for treatment or other patient management decisions. A negative result may occur with  improper specimen collection/handling, submission of specimen other than nasopharyngeal swab, presence of viral mutation(s) within the areas targeted by this assay, and inadequate number of viral copies (<131 copies/mL). A negative result must be combined with clinical observations, patient history, and epidemiological information. The expected result is Negative.  Fact Sheet for Patients:  PinkCheek.be  Fact Sheet for Healthcare Providers:   GravelBags.it  This test is no t yet approved or cleared by the [place] FDA and  has been authorized for detection and/or diagnosis of SARS-CoV-2 by FDA under an Emergency Use Authorization (EUA). This EUA will remain  in effect (meaning this test can be used) for the duration of the COVID-19 declaration under Section 564(b)(1) of the Act, 21 U.S.C. section 360bbb-3(b)(1), unless the authorization is terminated or revoked sooner.     Influenza A by PCR NEGATIVE NEGATIVE Final   Influenza B by PCR NEGATIVE NEGATIVE Final    Comment: (NOTE) The Xpert Xpress SARS-CoV-2/FLU/RSV assay is intended as an aid in  the diagnosis of influenza from Nasopharyngeal swab specimens and  should not be used as a sole basis for treatment. Nasal washings and  aspirates are unacceptable for Xpert Xpress SARS-CoV-2/FLU/RSV  testing.  Fact Sheet for Patients: PinkCheek.be  Fact Sheet for Healthcare Providers: GravelBags.it  This test is not yet approved or cleared by the [place] FDA and  has been authorized for detection and/or diagnosis of SARS-CoV-2 by  FDA under an Emergency Use Authorization (EUA). This EUA will remain  in effect (meaning this test can be used) for the duration of the  Covid-19 declaration under Section 564(b)(1) of the Act, 21  U.S.C. section 360bbb-3(b)(1), unless the authorization is  terminated or revoked. Performed at [facility], [place] [address] [place], [place] [address]   MRSA PCR Screening     Status: None   Collection Time: 09/29/20  2:12 PM   Specimen: Nasal Mucosa; Nasopharyngeal  Result Value Ref Range Status   MRSA by PCR NEGATIVE NEGATIVE Final    Comment:        The GeneXpert MRSA Assay (FDA approved for NASAL specimens only), is one component of a comprehensive MRSA colonization surveillance program. It is not intended to diagnose  MRSA infection nor to guide or monitor treatment for MRSA infections. Performed at [facility], [place] [address], [place], [place] [address]     Please note: You were cared for by a hospitalist during your hospital stay. Once you are discharged, your primary care physician will handle any further medical issues. Please note that NO REFILLS for any discharge medications will be authorized once you are discharged, as it is imperative that you return to your primary care physician (or establish a relationship with a primary care physician if you do not have one) for your post hospital discharge needs so that they can reassess your need for medications and monitor your lab values.    Time coordinating discharge: 40 minutes  SIGNED:   Shelly Coss, MD  [facility] 10/02/2020, 11:08 AM Pager [phone]  If 7PM-7AM, please contact night-coverage [email or web address] Password [email or web address]

## 2020-10-02 NOTE — Progress Notes (Signed)
AVS given to patient and explained at the bedside and over the phone with pt's son. Medications and follow up appointments have been explained with pt and the pt's son verbalizing understanding.

## 2020-10-02 NOTE — TOC Progression Note (Signed)
Transition of Care [facility]) - Progression Note    Patient Details  Name: Cassandra Allen MRN: [id number] Date of Birth: 03-Jan-1946  [facility] CM/SW Contact  Joaquin Courts, RN Phone Number: 10/02/2020, 12:12 PM  Clinical Narrative:    Dc summary and [place] orders faxed to [facility].     Expected Discharge Plan: [place] Barriers to Discharge: No Barriers Identified  Expected Discharge Plan and Services Expected Discharge Plan: [place]   Discharge Planning Services: CM Consult   Living arrangements for the past 2 months: Single Family Home Expected Discharge Date: 10/02/20                                     Social Determinants of Health (SDOH) Interventions    Readmission Risk Interventions Readmission Risk Prevention Plan 09/28/2020 03/13/2019  Transportation Screening Complete Complete  PCP or Specialist Appt within 3-5 Days Not Complete -  Not Complete comments Not ready for dc yet -  [place] or Home Care Consult Complete -  Social Work Consult for [place] Planning/Counseling Complete -  Palliative Care Screening Not Applicable -  Medication Review [employer] photographer) Complete Complete  PCP or Specialist appointment within 3-5 days of discharge - Complete  HRI or [place] - Not Complete  HRI or Home Care Consult Pt Refusal Comments - Plan for discharge to SNF.  SW Recovery Care/Counseling Consult - Complete  Palliative Care Screening - Not Applicable  Skilled Nursing Facility - Complete  Some recent data might be hidden

## 2020-10-02 NOTE — Plan of Care (Signed)
  Problem: Activity: Goal: Risk for activity intolerance will decrease Outcome: Progressing   Problem: Nutrition: Goal: Adequate nutrition will be maintained Outcome: Progressing   Problem: Safety: Goal: Ability to remain free from injury will improve Outcome: Progressing   

## 2020-10-03 ENCOUNTER — Encounter (HOSPITAL_COMMUNITY): Payer: Self-pay | Admitting: Gastroenterology

## 2020-10-05 ENCOUNTER — Telehealth: Payer: Self-pay | Admitting: Hematology

## 2020-10-05 NOTE — Telephone Encounter (Signed)
Scheduled appt per 11/19 - left message for pt with appt date and time

## 2020-10-17 NOTE — Progress Notes (Addendum)
[name]   [phone]) [phone] [phone]   Clinic Follow up Note   Patient Care Team: System, Provider Not In as PCP - General Thompson Grayer, MD as PCP - Cardiology (Cardiology) Thompson Grayer, MD as PCP - Electrophysiology (Cardiology) Ronnette Juniper, MD as Consulting Physician (Gastroenterology) Truitt Merle, MD as Consulting Physician (Medical Oncology) Newt Minion, MD as Consulting Physician (Orthopedic Surgery) Leighton Ruff, MD as Consulting Physician (Colon and Rectal Surgery) Date of Service: 10/18/2020   CHIEF COMPLAINT: Follow up colon cancer and hospitalization  SUMMARY OF ONCOLOGIC HISTORY: Oncology History Overview Note  Cancer Staging Cancer of splenic flexure of colon Staging form: Colon and Rectum, AJCC 8th Edition - Clinical stage from 10/25/2018: Stage Unknown (cTX, cN0, cM1) - Signed by Truitt Merle, MD on 11/22/2018 - Pathologic stage from 12/27/2018: pT3, pN0, cM1 - Signed by Truitt Merle, MD on 05/02/2019    Cancer of splenic flexure of colon  10/25/2018 Procedure   10/25/2018 Colonoscopy Impression: -Two 4 to 6 mm sessile polyps were found in the sigmoid colon. Resected and retrieved. -A 6 mm sessile polyp was found in the descending colon. Resected and retrieved. -Three 5 to 15 mm sessile polyps were found in the transverse colon. Resection and retrieval were complete. -A 12 mm sessile polyp was found in the cecum. Resection and retrieval were complete.  -A 20 mm sessile polyp was found in the ascending colon. Resection and retrieval were complete. . -A malignancy partially obstructing large mass was found at 55 cm proximal to the anus. Biopsied and tattooed. -Malignant partially obstructing tumor 20 cm proximal to the anus. Biopsied and tattooed. -Diverticulosis in the sigmoid colon, in the descending colon and in the transverse colon.    10/25/2018 Imaging   10/25/2018 Endoscopy Impression: -Normal esophagus -Z-line regular 35 cm  from the incisors. -Non-bleeding erosive gastropathy -Erythematous mucosa in the antrum. Biopsied. -Normal examined duodenum. Biopsied.   10/25/2018 Cancer Staging   Staging form: Colon and Rectum, AJCC 8th Edition - Clinical stage from 10/25/2018: Stage Unknown (cTX, cN0, cM1) - Signed by Truitt Merle, MD on 11/22/2018   10/30/2018 Initial Diagnosis   Cancer of left colon ([place])   10/30/2018 Initial Biopsy   Final Diagnosis: 10/30/18 1. Small intestine-Duedenum, Biospy:   Benign 2.Stomach-Antrum, Biospy:   Chronic inactive Gastritis 3. Large intestine-Sigmoid Colon, Polyp:   Tubular adenomas (2) 4. Large interstine-Descending Colon, Polyp:   Tubular adenoma with high grade dysplasia, suspicious for invasion.  5. Large intestine-transverse colon, Polyp:   Tubulovilous Adenomas (3).  6. Large Intestine-Cecum, Polp:   Tubular adenoma  7. Large Intestine-Ascending Colon, Polyp:  Tubulovilous Adenoma 8. Large intestine, Biopsy, Mass 55cm:  Invasive moderately differentiated adenocarcinoma.  9. Large intestine, biopsy, Mass 20cm:   Tubulovilous Adenoma   11/01/2018 Imaging   CT CAP W contrast 11/01/18  IMPRESSION: CT CHEST: 1. Right paratracheal adenopathy (immediately superior to the azygos vein) which short axis dimension of 1.2 cm. In the present clinical setting it is possible this is related to metastatic involvement. 2. Scattered pulmonary parenchymal changes none of which are highly suspicious for metastatic disease. 3. Abnormal appearance of the thyroid gland with left lower lobe ill-defined mass spanning over 3 cm. Thyroid ultrasound can be performed for further delineation. 4. Cardiomegaly. Coronary artery calcification. 5.  Aortic Atherosclerosis (ICD10-I70.0).  CT ABDOMEN PELVIS: 1. Numerous hepatic lesions suspicious for metastatic disease largest within the caudate lobe measuring up to 3.2 cm. 2. Gallbladder wall thickening. This may be related  to  increased right heart pressure or liver disease but could not exclude cholecystitis in the proper clinical setting. 3. Numerous splenic lesions which in the present clinical setting is suspicious for combination of metastatic disease and splenic cysts. 4. 3.2 cm left adrenal mass suspicious for metastatic disease. 5. Question sigmoid colon and possibly proximal descending colon mass. Rectosigmoid colon mass also not excluded. Correlation with colonoscopy results recommended. 6. Prominent number of colonic diverticula. Third spacing of fluid makes it difficult to evaluate for the possibility of diverticulitis. Radiopaque 1.5 cm structure within the right colon may be related to ingested foreign body. 7. Low-density fatty appearing structures in the external iliac region/pelvic sidewall bilaterally with adjacent low-density iliac lymph nodes and retroperitoneal lymph nodes raises possibility of low-density metastatic adenopathy. Prominent size portacaval lymph node which short axis dimension of 1.3 cm. PET-CT could be obtained for further delineation if clinically desired. 8. Gas within the urinary bladder may be related to recent manipulation. Clinical correlation recommended. 9. Left adnexal 2.4 cm cyst. This can be assessed with pelvic sonogram.   12/04/2018 - 12/18/2018 Chemotherapy   FOLFOX every 2 weeks starting 12/04/18. Stopped after cycle 2 on 12/18/18 due to SBO which resulted in left sigmoid colectomy. Unfortunately after surgery she developed post-op complications. She had an abcessed that required draininh in 02/2019.       12/07/2018 Imaging   MRI Abdomen 12/07/18  IMPRESSION: 1. The overall improvement with resolution of the number of the hepatic metastatic lesions. The dominant lesion in the caudate lobe is reduced in size from previous 4.0 by 3.3 cm to current 3.8 by 2.7 cm. 2. The splenic lesions are similar to prior and nonspecific. 3. The left adrenal mass is primarily  an adrenal adenoma. There is a cystic component laterally which is probably incidental rather than from a collision lesion. 4. Hepatic hemochromatosis. 5. 7 mm gallstone in the common bile duct compatible with choledocholithiasis. There also multiple gallstones in the gallbladder. 6.  Aortic Atherosclerosis (ICD10-I70.0). 7. Bosniak category 2 cyst in the right kidney upper pole.   12/27/2018 Surgery   LEFT SIGMOID COLECTOMY WITH HARTMANN POUCH AND END COLOSTOMY by Dr Hassell Done 12/27/18    12/27/2018 Pathology Results   Diagnosis Colon, segmental resection for tumor, distal transverse, descending, sigmoid - INVASIVE MODERATELY DIFFERENTIATED ADENOCARCINOMA, 5.0 CM, CIRCUMFERENTIALLY INVOLVING THE PROXIMAL DESCENDING COLON WITH ASSOCIATED LUMINAL OBSTRUCTION. SEE NOTE. - CARCINOMA INVADES INTO THE PERICOLONIC SOFT TISSUE. - RESECTION MARGINS ARE NEGATIVE FOR CARCINOMA. - NEGATIVE FOR LYMPHOVASCULAR OR PERINEURAL INVASION. - TWENTY-TWO BENIGN LYMPH NODES, NEGATIVE FOR CARCINOMA (0/22). - SEPARATE LARGE VILLOUS ADENOMA INVOLVING SIGMOID COLON, 5.0 CM, WITHOUT HIGH GRADE DYSPLASIA OR CARCINOMA. - NON-SPECIFIC CHANGES IN THE PROXIMAL COLON, CONSISTENT WITH DISTAL OBSTRUCTION. - SEE ONCOLOGY TABLE.   12/27/2018 Cancer Staging   Staging form: Colon and Rectum, AJCC 8th Edition - Pathologic stage from 12/27/2018: pT3, pN0, cM1 - Signed by Truitt Merle, MD on 05/02/2019   06/17/2019 Imaging   IMPRESSION: 1. Decrease in overall size of the left anterior abscess cavity containing the percutaneous drain. There remains an abscess cavity measuring 5.7 cm in greatest diameter and containing air and fluid. 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 2. Multiple ill-defined low-density lesions in the liver are again very concerning for metastatic disease. The 4 cm caudate lesion is the most suspicious for a metastatic lesion. Eventual  correlation with MRI of the abdomen may be helpful  to more accurately assess the extent of metastatic disease. 3. Gradual increase in the amount of free fluid in the peritoneal cavity with slight increase in perihepatic fluid since the prior study but clear gradual increase since prior scans in the last several months. Although there is no clear evidence of progressive carcinomatosis, the persistence and increased prominence of free fluid is concerning for potential malignant ascites. 4. Stable mildly prominent lymphadenopathy in the retroperitoneum, bilateral iliac chains and periportal/portacaval nodal stations.     09/16/2019 Imaging   IMPRESSION: 1. Well-positioned drainage catheter with interval resolution of anterior peritoneal abscess. No undrained isolated collection identified. 2. Slight interval decrease in the volume of peritoneal ascites. 3. Stable central hepatic mass without significant interval change. Lesion remains concerning for metastatic disease. 4. Stable splenic metastases. 5. Stable left adrenal lesion previously characterized as an adenoma. 6. Additional ancillary findings as above without significant interval change.   12/17/2019 PET scan     IMPRESSION: 1. Hypermetabolic expansile lesion in the caudate lobe of liver consistent with hepatic metastasis. 2. No additional evidence soft tissue metastasis or nodal metastasis. 3. Low metabolic activity associated with low-density LEFT adrenal lesions favored complex benign adenoma. 4. Post colectomy without evidence of local recurrence. No bowel obstruction. 5. Intense activity associated along the RIGHT femoral neck suggests bursitis.     01/20/2020 Imaging   MRI Abdomen    IMPRESSION: 1. Caudate metastasis has enlarged since 2019 but is similar based on comparison CT evaluation from September 16, 2019 no new or progressive disease is identified. 2. Very subtle area of susceptibility along the anterior  liver margin may represent an additional site of disease but given the appearance of previous imaging studies this may represent a treated area of disease, particularly when comparing the study to 06/17/2019. 3. Stable multifocal splenic lesions. 4. Cholelithiasis. 5. Signs of partial colectomy with right lower quadrant colostomy. 6. Mild amorphous enhancement along erector spinae musculature on the left is of uncertain significance perhaps related to mild posttraumatic changes, atrophy or mild myositis. Correlate with any symptoms in this area or recent trauma and consider short interval follow-up for further assessment.     03/18/2020 - 04/09/2020 Radiation Therapy   SBRT to liver lesion with Dr Lisbeth Renshaw 03/18/20-04/09/20   08/05/2020 Imaging   MRI abdomen  IMPRESSION: 1. Treated metastatic lesions centered in the caudate lobe of the liver appears very similar to the prior study from 01/20/2020, as detailed above. No new hepatic lesions or other signs of metastatic disease are noted elsewhere in the abdomen. 2. Biliary sludge and cholelithiasis without evidence of acute cholecystitis. 3. Small volume of ascites. 4. Colonic diverticulosis. 5. Additional incidental findings, as above.   09/27/2020 Imaging   MRIIMPRESSION: 1. Markedly progressed intrahepatic biliary duct dilatation since 08/05/2020. Both the left and right hepatic ducts become obliterated in the region of the biliary confluence near the porta hepatis. There is some amorphous abnormal T1 signal in the porta hepatis which may represent metastatic involvement although a discrete obstructing mass lesion cannot be discerned. Common bile duct in the head of pancreas is nondilated. 2. Persistent flow signal in the portal vein although portal vein appears attenuated in the hepatoduodenal ligament. 3. Cholelithiasis. Prominent gallbladder distension. Similar to 08/05/2020. 4. Similar appearance of the left adrenal mass measuring  3.0 x 2.7 cm today compared to 3.0 x 3.2 cm previously. 5. No substantial change in small volume ascites. 6. Small bilateral pleural effusions with bibasilar collapse/consolidation, right greater than left.  09/29/2020 Procedure   S/p ERCP and stenting per Dr. Watt Climes during hospitalization      CURRENT THERAPY: S/p prior chemo and left hemicolectomy and SBRT to liver, currently on observation and supportive care   INTERVAL HISTORY: Cassandra Allen returns for hospital f/u as scheduled. She presented to ED on 09/25/20 with abdominal pain, n/v, and juandice with Tbili 10.5. CT showed worsening liver metastasis and biliary dilatation. She underwent ERCP and stenting by Dr. Watt Climes on 09/29/20. She was difficult to wake up and was admitted to ICU. Palliative care was consulted for goals of care discussion. Transaminitis and hyperbilirubinemia drastically improved and she was discharged home on 11/20 with outpatient f/up for further discussion.   Today she presents from her facility in a wheelchair, she is alone. She feels about the same since hospital discharge. She continues to need assistance getting out of bed and with ADLs. She thinks she could be more independent at home with her lift chair. She will go back to her son's house when she leaves current facility after a week stay. Appetite is good. Denies pain. BMs are soft, not black or bloody. Denies other bleeding. No n/v. She has a chronic cough with clear phlegm. No fever, chills, chest pain, dyspnea.    MEDICAL HISTORY:  Past Medical History:  Diagnosis Date  . Acute diastolic heart failure ([place]) 12/22/2018  . Arthritis   . Atrial fibrillation, chronic ([place]) 12/22/2018  . Cancer of left colon ([place]) 10/30/2018  . Cancer of sigmoid colon  12/27/2018  . Diabetes mellitus without complication ([place])   . Hypertension   . Obesity (BMI 30-39.9) 12/27/2018    SURGICAL HISTORY: Past Surgical History:  Procedure Laterality Date  . BILIARY STENT  PLACEMENT N/A 09/29/2020   Procedure: BILIARY STENT PLACEMENT;  Surgeon: Clarene Essex, MD;  Location: [facility];  Service: Endoscopy;  Laterality: N/A;  . COLONOSCOPY  10/2018   Dr [name].  Large cancer at splenic flexure,  Bulky sigmoid colon mass, Numerous polyps  . ERCP N/A 09/29/2020   Procedure: ENDOSCOPIC RETROGRADE CHOLANGIOPANCREATOGRAPHY (ERCP);  Surgeon: Clarene Essex, MD;  Location: [facility];  Service: Endoscopy;  Laterality: N/A;  . Intra-abdominal abscess drainage  01/31/2019   Abscess drainage grew Enterococcus faecalis  . IR CATHETER TUBE CHANGE  06/05/2019  . IR IMAGING GUIDED PORT INSERTION  11/20/2018  . IR RADIOLOGIST EVAL & MGMT  06/04/2019  . IR RADIOLOGIST EVAL & MGMT  06/17/2019  . IR RADIOLOGIST EVAL & MGMT  09/16/2019  . LAPAROTOMY N/A 12/27/2018   Procedure: LEFT SIGMOID COLECTOMY WITH HARTMANN POUCH AND END COLOSTOMY;  Surgeon: [name], MD;  Location: [facility];  Service: General;  Laterality: N/A;  . SPHINCTEROTOMY  09/29/2020   Procedure: Joan Mayans;  Surgeon: Clarene Essex, MD;  Location: [facility];  Service: Endoscopy;;    I have reviewed the social history and family history with the patient and they are unchanged from previous note.  ALLERGIES:  is allergic to metformin and related and prednisone.  MEDICATIONS:  Current Outpatient Medications  Medication Sig Dispense Refill  . amLODipine (NORVASC) 5 MG tablet Take 5 mg by mouth daily.     . fentaNYL (DURAGESIC) 25 MCG/HR Place 1 patch onto the skin every 3 (three) days. 5 patch 0  . ferrous sulfate (KP FERROUS SULFATE) 325 (65 FE) MG tablet Take 1 tablet (325 mg total) by mouth daily with breakfast. 30 tablet 1  . hydrocortisone cream 1 % Apply topically 3 (three) times daily as needed for  itching. 30 g 1  . Insulin Detemir (LEVEMIR) 100 UNIT/ML Pen Inject 16 Units into the skin daily. 15 mL 0  . lidocaine (LIDODERM) 5 % Place 1 patch onto the skin daily. Remove & Discard patch within 12 hours or  as directed by MD 30 patch 0  . methocarbamol (ROBAXIN) 500 MG tablet Take 1 tablet (500 mg total) by mouth every 6 (six) hours as needed for muscle spasms. 30 tablet 0  . metoprolol tartrate (LOPRESSOR) 25 MG tablet Take 12.5 mg by mouth 2 (two) times daily.     Marland Kitchen oxybutynin (DITROPAN-XL) 10 MG 24 hr tablet Take 1 tablet (10 mg total) by mouth daily. 30 tablet 0  . oxyCODONE (OXY IR/ROXICODONE) 5 MG immediate release tablet Take 1 tablet (5 mg total) by mouth every 4 (four) hours as needed for moderate pain or severe pain. 30 tablet 0  . pantoprazole (PROTONIX) 40 MG tablet Take 1 tablet (40 mg total) by mouth daily. 30 tablet 1  . QUEtiapine (SEROQUEL) 25 MG tablet Take 12.5 mg by mouth at bedtime.     . sertraline (ZOLOFT) 100 MG tablet Take 100 mg by mouth in the morning and at bedtime.    . simvastatin (ZOCOR) 10 MG tablet Take 1 tablet (10 mg total) by mouth daily at 6 PM. 30 tablet 0  . ursodiol (ACTIGALL) 300 MG capsule Take 1 capsule (300 mg total) by mouth 2 (two) times daily. 60 capsule 1   No current facility-administered medications for this visit.    PHYSICAL EXAMINATION: ECOG PERFORMANCE STATUS: 3 - Symptomatic, >50% confined to bed  Vitals:   10/18/20 1517  BP: (!) 122/46  Pulse: 66  Resp: 17  Temp: 99.8 F (37.7 C)  SpO2: 98%   There were no vitals filed for this visit.  GENERAL:alert, no distress and comfortable SKIN: no rash  EYES:  sclera clear LUNGS: clear with normal breathing effort HEART: regular rate & rhythm. Legs wrapped  ABDOMEN:abdomen soft, non-tender and normal bowel sounds. Colostomy in place NEURO: alert & oriented x 3 with fluent speech, generalized weakness PAC without erythema   LABORATORY DATA:  I have reviewed the data as listed CBC Latest Ref Rng & Units 10/18/2020 10/18/2020 10/01/2020  WBC 4.0 - 10.5 K/uL - 7.0 8.0  Hemoglobin 12.0 - 15.0 g/dL 6.8(LL) 6.5(LL) 9.2(L)  Hematocrit 36 - 46 % 22.4(L) 21.6(L) 29.3(L)  Platelets 150 - 400  K/uL - 236 270     CMP Latest Ref Rng & Units 10/18/2020 10/02/2020 10/01/2020  Glucose 70 - 99 mg/dL 192(H) 132(H) 228(H)  BUN 8 - 23 mg/dL 40(H) 39(H) 43(H)  Creatinine 0.44 - 1.00 mg/dL 1.53(H) 1.70(H) 2.05(H)  Sodium 135 - 145 mmol/L 144 138 137  Potassium 3.5 - 5.1 mmol/L 3.7 3.4(L) 3.9  Chloride 98 - 111 mmol/L 116(H) 103 103  CO2 22 - 32 mmol/L 22 26 24   Calcium 8.9 - 10.3 mg/dL 7.8(L) 7.8(L) 8.1(L)  Total Protein 6.5 - 8.1 g/dL 5.2(L) 4.8(L) 5.2(L)  Total Bilirubin 0.3 - 1.2 mg/dL 1.5(H) 2.9(H) 3.8(H)  Alkaline Phos 38 - 126 U/L 204(H) 365(H) 433(H)  AST 15 - 41 U/L 21 30 31   ALT 0 - 44 U/L 21 35 40      RADIOGRAPHIC STUDIES: I have personally reviewed the radiological images as listed and agreed with the findings in the report. No results found.   ASSESSMENT & PLAN: Cassandra Pless Wittenbrookis a 74 y.o.femalewith   1.Left colon Cancer,G1,pT3N0cMx with probable liver  metastasis -She was diagnosed withInvasive moderately differentiated adenocarcinomaof left colon in 10/2018.She presented with iron deficient anemia -CT CAP from 11/01/18 showed several hepatic lesions suspicious for metastasis. Her subsequent liver biopsy from 11/20/18 was negativefor malignancy.PerDr. Jacelyn Grip didthebiopsy,the lesionwas not very clear on the ultrasound, and was in a difficult spot for biopsy. -She started FOLFOX on 12/04/18.Chemo was stopped after cycle 2 due tobowel obstruction from her primary colon tumorwhich required urgentleft sigmoid colectomy. Unfortunately after surgery she developed post-op complications. She had anabscessthat required drainingin 02/2019.  -Hertumor was removed completely, she had negative margins and node negative.Liver biopsy was not feasible during her colon surgery -Pt has had complicated course, frequent hospitalization for recurrent fall, abscess,  -PET 12/2019 showed enlarging hypermetabolic lesion in caudate liver, no additional metastatic  disease. S/p SBRT with Dr. Lisbeth Renshaw in 03/2020 and has been on observation  -she was admitted in 09/2020 for biliary obstruction, CT showed progressive biliary dilatation, an underlying mass was not ruled out. She underwent stenting and obstruction improved. LFTs trending down. She is recovering slowly from hospitalization.  2. Anemia of iron deficiency and chronic disease -She initially presented to [place] with IDA, had reaction to Feraheme now on venofer PRN. BM biopsy negative for MDS or malignancy -She declined multiple recommendations to pursue colonoscopy and was ultimately diagnosed with colon cancer.  -Her 11/2018 MRI shows iron deposits in her liver, will give IV iron more sparingly to prevent iron overload. -resolved after hemicolectomy until iron studies on 11/05/19 showed serum iron 33 and 16% transferrin saturation. TIBC 205, ferritin 197 (down from 446 in 03/2019) -on oral iron, has periodic acute on chronic IDA  3. HTN, AF, DM -per PCP  4. Social Support, h/o depression -she lives with her son and daughter in law when she is not hospitalized or in rehab/SNF. Current situation is not ideal but patient lacks financial resources of alternative living situation.  -Son took away car afterfrequent hospitalizations last year, they also control her groceries and medications despite patient does not have cognitive impairment. They do not help much with transportation and she uses [employer] rides -her chronic depression worsened in 2021 when she lost that independence  -followed by SW  5. Frequent fall, large right forearm hematoma -currently at Rehab facility, requires assistance with ADLs, has lift chair at home  6.Goal of care discussion  -full code    Disposition:  Cassandra Allen appears stable. She is recovering slowly from recent hospitalization, currently at Rehab with plans to go home to son's house in a week.   We reviewed her hospital course, biliary obstruction resolved  after stenting, an underlying mass was not ruled out. Due to low PS, she is not currently a candidate for chemotherapy, but she is interested in cancer treatment if she can improve.   A referral to surgery was discussed. We will review in tumor conference and place a referral.   Labs reviewed, she has acute on chronic anemia Hg 6.8 on repeat H/H. We will arrange 2 units pRBC transfusion this week at [facility] facility closer to her current residence. CMP stable.   She will return for lab and follow up next month. Patient seen with Dr. Burr Medico.    Orders Placed This Encounter  Procedures  . Hemoglobin and Hematocrit (Cancer Center Only)  . Type and screen    Standing Status:   Future    Number of Occurrences:   1    Standing Expiration Date:   10/18/2021   All questions were  answered. The patient knows to call the clinic with any problems, questions or concerns. No barriers to learning were detected.     Alla Feeling, NP 10/19/20   Addendum  I have seen the patient, examined her. I agree with the assessment and and plan and have edited the notes.   She has been recovering slowing from her recent hospitalization, she will likely be discharged home from rehab in near future per pt. However her PS remains to be low, and she is not a candidate for chemo now.   Her recent significant airway obstruction is likely related to her underlying metastatic liver lesion in caudate, her recent CT and MRI did not show node or distant metastasis (left adrenal gland lesion was not hypermetabolic on previous PET, and overall stable, likely adenoma).  She has previously received radiation to this oligo liver metastasis.  I discussed the option of surgery vs chemotherapy, she is not therapeutic and 4 hours of treatment due to her advanced age, medical comorbidities and low PS.  However patient is interested in consulting surgery, I will refer her back to [facility] surgery. If surgery is not offered, I will  re-evaluate her candidacy for chemo in 3-4 weeks.   She has significant anemia, also no clear source of bleeding.  She will benefit from blood transfusion.  Patient does not want to return to our office, or go to ED for blood transfusion due to transportation issue, will contact her [facility] program to see if they can give her blood.   Lab and f/u in 3 weeks.  Truitt Merle  10/18/2020

## 2020-10-18 ENCOUNTER — Inpatient Hospital Stay: Payer: Medicare (Managed Care) | Attending: Hematology | Admitting: Nurse Practitioner

## 2020-10-18 ENCOUNTER — Other Ambulatory Visit: Payer: Self-pay

## 2020-10-18 ENCOUNTER — Inpatient Hospital Stay: Payer: Medicare (Managed Care)

## 2020-10-18 VITALS — BP 122/46 | HR 66 | Temp 99.8°F | Resp 17 | Ht 62.0 in

## 2020-10-18 DIAGNOSIS — C185 Malignant neoplasm of splenic flexure: Secondary | ICD-10-CM | POA: Diagnosis not present

## 2020-10-18 DIAGNOSIS — J90 Pleural effusion, not elsewhere classified: Secondary | ICD-10-CM | POA: Insufficient documentation

## 2020-10-18 DIAGNOSIS — Z95828 Presence of other vascular implants and grafts: Secondary | ICD-10-CM

## 2020-10-18 DIAGNOSIS — F32A Depression, unspecified: Secondary | ICD-10-CM | POA: Diagnosis not present

## 2020-10-18 DIAGNOSIS — D122 Benign neoplasm of ascending colon: Secondary | ICD-10-CM | POA: Diagnosis not present

## 2020-10-18 DIAGNOSIS — I4891 Unspecified atrial fibrillation: Secondary | ICD-10-CM | POA: Insufficient documentation

## 2020-10-18 DIAGNOSIS — I5032 Chronic diastolic (congestive) heart failure: Secondary | ICD-10-CM | POA: Diagnosis not present

## 2020-10-18 DIAGNOSIS — E279 Disorder of adrenal gland, unspecified: Secondary | ICD-10-CM | POA: Diagnosis not present

## 2020-10-18 DIAGNOSIS — N281 Cyst of kidney, acquired: Secondary | ICD-10-CM | POA: Diagnosis not present

## 2020-10-18 DIAGNOSIS — R296 Repeated falls: Secondary | ICD-10-CM | POA: Diagnosis not present

## 2020-10-18 DIAGNOSIS — D123 Benign neoplasm of transverse colon: Secondary | ICD-10-CM | POA: Insufficient documentation

## 2020-10-18 DIAGNOSIS — C787 Secondary malignant neoplasm of liver and intrahepatic bile duct: Secondary | ICD-10-CM | POA: Diagnosis not present

## 2020-10-18 DIAGNOSIS — D120 Benign neoplasm of cecum: Secondary | ICD-10-CM | POA: Insufficient documentation

## 2020-10-18 DIAGNOSIS — I700 Atherosclerosis of aorta: Secondary | ICD-10-CM | POA: Insufficient documentation

## 2020-10-18 DIAGNOSIS — R7401 Elevation of levels of liver transaminase levels: Secondary | ICD-10-CM | POA: Insufficient documentation

## 2020-10-18 DIAGNOSIS — R188 Other ascites: Secondary | ICD-10-CM | POA: Insufficient documentation

## 2020-10-18 DIAGNOSIS — C186 Malignant neoplasm of descending colon: Secondary | ICD-10-CM | POA: Insufficient documentation

## 2020-10-18 DIAGNOSIS — I110 Hypertensive heart disease with heart failure: Secondary | ICD-10-CM | POA: Diagnosis not present

## 2020-10-18 DIAGNOSIS — Z79899 Other long term (current) drug therapy: Secondary | ICD-10-CM | POA: Insufficient documentation

## 2020-10-18 DIAGNOSIS — Z933 Colostomy status: Secondary | ICD-10-CM | POA: Insufficient documentation

## 2020-10-18 DIAGNOSIS — Z8719 Personal history of other diseases of the digestive system: Secondary | ICD-10-CM | POA: Insufficient documentation

## 2020-10-18 DIAGNOSIS — D125 Benign neoplasm of sigmoid colon: Secondary | ICD-10-CM | POA: Diagnosis not present

## 2020-10-18 DIAGNOSIS — E119 Type 2 diabetes mellitus without complications: Secondary | ICD-10-CM | POA: Diagnosis not present

## 2020-10-18 DIAGNOSIS — Z9049 Acquired absence of other specified parts of digestive tract: Secondary | ICD-10-CM | POA: Insufficient documentation

## 2020-10-18 DIAGNOSIS — Z9221 Personal history of antineoplastic chemotherapy: Secondary | ICD-10-CM | POA: Insufficient documentation

## 2020-10-18 DIAGNOSIS — D509 Iron deficiency anemia, unspecified: Secondary | ICD-10-CM

## 2020-10-18 DIAGNOSIS — I2510 Atherosclerotic heart disease of native coronary artery without angina pectoris: Secondary | ICD-10-CM | POA: Diagnosis not present

## 2020-10-18 DIAGNOSIS — S5011XA Contusion of right forearm, initial encounter: Secondary | ICD-10-CM | POA: Insufficient documentation

## 2020-10-18 DIAGNOSIS — D124 Benign neoplasm of descending colon: Secondary | ICD-10-CM | POA: Insufficient documentation

## 2020-10-18 DIAGNOSIS — Z888 Allergy status to other drugs, medicaments and biological substances status: Secondary | ICD-10-CM | POA: Insufficient documentation

## 2020-10-18 DIAGNOSIS — R053 Chronic cough: Secondary | ICD-10-CM | POA: Insufficient documentation

## 2020-10-18 LAB — CMP (CANCER CENTER ONLY)
ALT: 21 U/L (ref 0–44)
AST: 21 U/L (ref 15–41)
Albumin: 2.1 g/dL — ABNORMAL LOW (ref 3.5–5.0)
Alkaline Phosphatase: 204 U/L — ABNORMAL HIGH (ref 38–126)
Anion gap: 6 (ref 5–15)
BUN: 40 mg/dL — ABNORMAL HIGH (ref 8–23)
CO2: 22 mmol/L (ref 22–32)
Calcium: 7.8 mg/dL — ABNORMAL LOW (ref 8.9–10.3)
Chloride: 116 mmol/L — ABNORMAL HIGH (ref 98–111)
Creatinine: 1.53 mg/dL — ABNORMAL HIGH (ref 0.44–1.00)
GFR, Estimated: 36 mL/min — ABNORMAL LOW (ref >60)
Glucose, Bld: 192 mg/dL — ABNORMAL HIGH (ref 70–99)
Potassium: 3.7 mmol/L (ref 3.5–5.1)
Sodium: 144 mmol/L (ref 135–145)
Total Bilirubin: 1.5 mg/dL — ABNORMAL HIGH (ref 0.3–1.2)
Total Protein: 5.2 g/dL — ABNORMAL LOW (ref 6.5–8.1)

## 2020-10-18 LAB — CBC WITH DIFFERENTIAL (CANCER CENTER ONLY)
Abs Immature Granulocytes: 0.06 10*3/uL (ref 0.00–0.07)
Basophils Absolute: 0 10*3/uL (ref 0.0–0.1)
Basophils Relative: 0 %
Eosinophils Absolute: 0.1 10*3/uL (ref 0.0–0.5)
Eosinophils Relative: 2 %
HCT: 21.6 % — ABNORMAL LOW (ref 36.0–46.0)
Hemoglobin: 6.5 g/dL — CL (ref 12.0–15.0)
Immature Granulocytes: 1 %
Lymphocytes Relative: 5 %
Lymphs Abs: 0.3 10*3/uL — ABNORMAL LOW (ref 0.7–4.0)
MCH: 29.5 pg (ref 26.0–34.0)
MCHC: 30.1 g/dL (ref 30.0–36.0)
MCV: 98.2 fL (ref 80.0–100.0)
Monocytes Absolute: 0.6 10*3/uL (ref 0.1–1.0)
Monocytes Relative: 9 %
Neutro Abs: 5.8 10*3/uL (ref 1.7–7.7)
Neutrophils Relative %: 83 %
Platelet Count: 236 10*3/uL (ref 150–400)
RBC: 2.2 MIL/uL — ABNORMAL LOW (ref 3.87–5.11)
RDW: 17.9 % — ABNORMAL HIGH (ref 11.5–15.5)
WBC Count: 7 10*3/uL (ref 4.0–10.5)
nRBC: 0 % (ref 0.0–0.2)

## 2020-10-18 LAB — TYPE AND SCREEN
ABO/RH(D): A POS
Antibody Screen: NEGATIVE

## 2020-10-18 LAB — HEMOGLOBIN AND HEMATOCRIT (CANCER CENTER ONLY)
HCT: 22.4 % — ABNORMAL LOW (ref 36.0–46.0)
Hemoglobin: 6.8 g/dL — CL (ref 12.0–15.0)

## 2020-10-18 MED ORDER — HEPARIN SOD (PORK) LOCK FLUSH 100 UNIT/ML IV SOLN
500.0000 [IU] | Freq: Once | INTRAVENOUS | Status: AC
  Administered 2020-10-18: 500 [IU]
  Filled 2020-10-18: qty 5

## 2020-10-18 MED ORDER — SODIUM CHLORIDE 0.9% FLUSH
10.0000 mL | Freq: Once | INTRAVENOUS | Status: AC
  Administered 2020-10-18: 10 mL
  Filled 2020-10-18: qty 10

## 2020-10-18 NOTE — Progress Notes (Signed)
Critical Value: Hgb 6.5 Cira Rue, NP notified.

## 2020-10-19 ENCOUNTER — Encounter: Payer: Self-pay | Admitting: Nurse Practitioner

## 2020-10-19 ENCOUNTER — Telehealth: Payer: Self-pay | Admitting: Nurse Practitioner

## 2020-10-19 LAB — FERRITIN: Ferritin: 475 ng/mL — ABNORMAL HIGH (ref 11–307)

## 2020-10-19 LAB — IRON AND TIBC
Iron: 25 ug/dL — ABNORMAL LOW (ref 41–142)
Saturation Ratios: 13 % — ABNORMAL LOW (ref 21–57)
TIBC: 188 ug/dL — ABNORMAL LOW (ref 236–444)
UIBC: 163 ug/dL (ref 120–384)

## 2020-10-19 NOTE — Telephone Encounter (Signed)
I spoke to [employer], [place] at [facility], Ms. Coad became a patient there about 2 months ago and is currently in a "respite stay" because of her social situation at home. During respite stays she can not engage in PT, sounds like logistical decision or insurance? I strongly recommend to resume once her respite stay concludes. Luellen Pucker, NP confirms she can set up blood transfusion this week, they place orders. We will follow up after tumor board next week once we hear from surgery, then see pt back on 1/5 to see how she has improved. If she has not improved, still not a chemo candidate and not a surgical candidate, we will discuss palliative care/hospice. PA understands. I appreciate the opportunity to talk to her.   Cira Rue, NP

## 2020-10-27 ENCOUNTER — Other Ambulatory Visit: Payer: Self-pay

## 2020-10-27 NOTE — Progress Notes (Signed)
The proposed treatment discussed in conference is for discussion purposes only and is not a binding recommendation.  The patients have not been physically examined, or presented with their treatment options.  Therefore, final treatment plans cannot be decided.   

## 2020-11-11 ENCOUNTER — Ambulatory Visit: Payer: Medicare HMO | Admitting: Hematology

## 2020-11-11 ENCOUNTER — Other Ambulatory Visit: Payer: Medicare HMO

## 2020-11-15 ENCOUNTER — Telehealth: Payer: Self-pay | Admitting: Hematology

## 2020-11-15 NOTE — Telephone Encounter (Signed)
Scheduled follow-up appointment per 1/3 schedule message.
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
## 2020-11-17 ENCOUNTER — Inpatient Hospital Stay: Payer: Medicare (Managed Care) | Admitting: Hematology

## 2020-11-17 ENCOUNTER — Telehealth: Payer: Self-pay

## 2020-11-17 ENCOUNTER — Inpatient Hospital Stay: Payer: Medicare (Managed Care)

## 2020-11-17 NOTE — Telephone Encounter (Signed)
I spoke with Danton Clap of [facility], [phone].  She will arrange transportation for [facility] for upcoming appts on 12/06/2020.

## 2020-11-18 ENCOUNTER — Other Ambulatory Visit: Payer: Medicare (Managed Care)

## 2020-11-18 ENCOUNTER — Ambulatory Visit: Payer: Medicare (Managed Care) | Admitting: Hematology

## 2020-12-05 ENCOUNTER — Other Ambulatory Visit: Payer: Self-pay | Admitting: Hematology

## 2020-12-05 DIAGNOSIS — C185 Malignant neoplasm of splenic flexure: Secondary | ICD-10-CM

## 2020-12-06 ENCOUNTER — Inpatient Hospital Stay: Payer: Medicare (Managed Care) | Admitting: Hematology

## 2020-12-06 ENCOUNTER — Inpatient Hospital Stay: Payer: Medicare (Managed Care) | Attending: Hematology

## 2020-12-06 ENCOUNTER — Inpatient Hospital Stay: Payer: Medicare (Managed Care)

## 2020-12-06 DIAGNOSIS — I482 Chronic atrial fibrillation, unspecified: Secondary | ICD-10-CM | POA: Insufficient documentation

## 2020-12-06 DIAGNOSIS — I10 Essential (primary) hypertension: Secondary | ICD-10-CM | POA: Insufficient documentation

## 2020-12-06 DIAGNOSIS — I517 Cardiomegaly: Secondary | ICD-10-CM | POA: Insufficient documentation

## 2020-12-06 DIAGNOSIS — C185 Malignant neoplasm of splenic flexure: Secondary | ICD-10-CM

## 2020-12-06 DIAGNOSIS — C787 Secondary malignant neoplasm of liver and intrahepatic bile duct: Secondary | ICD-10-CM | POA: Insufficient documentation

## 2020-12-06 DIAGNOSIS — D123 Benign neoplasm of transverse colon: Secondary | ICD-10-CM | POA: Insufficient documentation

## 2020-12-06 DIAGNOSIS — C186 Malignant neoplasm of descending colon: Secondary | ICD-10-CM | POA: Insufficient documentation

## 2020-12-06 DIAGNOSIS — Z933 Colostomy status: Secondary | ICD-10-CM | POA: Insufficient documentation

## 2020-12-06 DIAGNOSIS — Z888 Allergy status to other drugs, medicaments and biological substances status: Secondary | ICD-10-CM | POA: Insufficient documentation

## 2020-12-06 DIAGNOSIS — E669 Obesity, unspecified: Secondary | ICD-10-CM | POA: Insufficient documentation

## 2020-12-06 DIAGNOSIS — D124 Benign neoplasm of descending colon: Secondary | ICD-10-CM | POA: Insufficient documentation

## 2020-12-06 DIAGNOSIS — F32A Depression, unspecified: Secondary | ICD-10-CM | POA: Insufficient documentation

## 2020-12-06 DIAGNOSIS — Z7901 Long term (current) use of anticoagulants: Secondary | ICD-10-CM | POA: Insufficient documentation

## 2020-12-06 DIAGNOSIS — E279 Disorder of adrenal gland, unspecified: Secondary | ICD-10-CM | POA: Insufficient documentation

## 2020-12-06 DIAGNOSIS — K831 Obstruction of bile duct: Secondary | ICD-10-CM | POA: Insufficient documentation

## 2020-12-06 DIAGNOSIS — Z9049 Acquired absence of other specified parts of digestive tract: Secondary | ICD-10-CM | POA: Insufficient documentation

## 2020-12-06 DIAGNOSIS — J90 Pleural effusion, not elsewhere classified: Secondary | ICD-10-CM | POA: Insufficient documentation

## 2020-12-06 DIAGNOSIS — D638 Anemia in other chronic diseases classified elsewhere: Secondary | ICD-10-CM | POA: Insufficient documentation

## 2020-12-06 DIAGNOSIS — D125 Benign neoplasm of sigmoid colon: Secondary | ICD-10-CM | POA: Insufficient documentation

## 2020-12-06 DIAGNOSIS — R188 Other ascites: Secondary | ICD-10-CM | POA: Insufficient documentation

## 2020-12-06 DIAGNOSIS — I2510 Atherosclerotic heart disease of native coronary artery without angina pectoris: Secondary | ICD-10-CM | POA: Insufficient documentation

## 2020-12-06 DIAGNOSIS — D122 Benign neoplasm of ascending colon: Secondary | ICD-10-CM | POA: Insufficient documentation

## 2020-12-06 DIAGNOSIS — D500 Iron deficiency anemia secondary to blood loss (chronic): Secondary | ICD-10-CM

## 2020-12-06 DIAGNOSIS — E119 Type 2 diabetes mellitus without complications: Secondary | ICD-10-CM | POA: Insufficient documentation

## 2020-12-06 DIAGNOSIS — Z8719 Personal history of other diseases of the digestive system: Secondary | ICD-10-CM | POA: Insufficient documentation

## 2020-12-06 DIAGNOSIS — D120 Benign neoplasm of cecum: Secondary | ICD-10-CM | POA: Insufficient documentation

## 2020-12-06 DIAGNOSIS — Z79899 Other long term (current) drug therapy: Secondary | ICD-10-CM | POA: Insufficient documentation

## 2020-12-06 DIAGNOSIS — I700 Atherosclerosis of aorta: Secondary | ICD-10-CM | POA: Insufficient documentation
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
## 2020-12-13 ENCOUNTER — Inpatient Hospital Stay (HOSPITAL_BASED_OUTPATIENT_CLINIC_OR_DEPARTMENT_OTHER): Payer: Medicare (Managed Care) | Admitting: Hematology

## 2020-12-13 ENCOUNTER — Inpatient Hospital Stay: Payer: Medicare (Managed Care)

## 2020-12-13 ENCOUNTER — Other Ambulatory Visit: Payer: Self-pay

## 2020-12-13 ENCOUNTER — Telehealth: Payer: Self-pay | Admitting: Hematology

## 2020-12-13 ENCOUNTER — Encounter: Payer: Self-pay | Admitting: Hematology

## 2020-12-13 VITALS — BP 137/49 | HR 64 | Temp 97.6°F | Resp 15 | Ht 62.0 in

## 2020-12-13 DIAGNOSIS — C185 Malignant neoplasm of splenic flexure: Secondary | ICD-10-CM

## 2020-12-13 DIAGNOSIS — I482 Chronic atrial fibrillation, unspecified: Secondary | ICD-10-CM | POA: Diagnosis not present

## 2020-12-13 DIAGNOSIS — E279 Disorder of adrenal gland, unspecified: Secondary | ICD-10-CM | POA: Diagnosis not present

## 2020-12-13 DIAGNOSIS — I517 Cardiomegaly: Secondary | ICD-10-CM | POA: Diagnosis not present

## 2020-12-13 DIAGNOSIS — K831 Obstruction of bile duct: Secondary | ICD-10-CM | POA: Diagnosis not present

## 2020-12-13 DIAGNOSIS — I700 Atherosclerosis of aorta: Secondary | ICD-10-CM | POA: Diagnosis not present

## 2020-12-13 DIAGNOSIS — Z79899 Other long term (current) drug therapy: Secondary | ICD-10-CM | POA: Diagnosis not present

## 2020-12-13 DIAGNOSIS — D638 Anemia in other chronic diseases classified elsewhere: Secondary | ICD-10-CM | POA: Diagnosis not present

## 2020-12-13 DIAGNOSIS — J90 Pleural effusion, not elsewhere classified: Secondary | ICD-10-CM | POA: Diagnosis not present

## 2020-12-13 DIAGNOSIS — R188 Other ascites: Secondary | ICD-10-CM | POA: Diagnosis not present

## 2020-12-13 DIAGNOSIS — D125 Benign neoplasm of sigmoid colon: Secondary | ICD-10-CM | POA: Diagnosis not present

## 2020-12-13 DIAGNOSIS — E1121 Type 2 diabetes mellitus with diabetic nephropathy: Secondary | ICD-10-CM | POA: Diagnosis not present

## 2020-12-13 DIAGNOSIS — Z7901 Long term (current) use of anticoagulants: Secondary | ICD-10-CM | POA: Diagnosis not present

## 2020-12-13 DIAGNOSIS — E669 Obesity, unspecified: Secondary | ICD-10-CM | POA: Diagnosis not present

## 2020-12-13 DIAGNOSIS — D509 Iron deficiency anemia, unspecified: Secondary | ICD-10-CM

## 2020-12-13 DIAGNOSIS — D123 Benign neoplasm of transverse colon: Secondary | ICD-10-CM | POA: Diagnosis not present

## 2020-12-13 DIAGNOSIS — Z95828 Presence of other vascular implants and grafts: Secondary | ICD-10-CM

## 2020-12-13 DIAGNOSIS — E119 Type 2 diabetes mellitus without complications: Secondary | ICD-10-CM | POA: Diagnosis not present

## 2020-12-13 DIAGNOSIS — F32A Depression, unspecified: Secondary | ICD-10-CM | POA: Diagnosis not present

## 2020-12-13 DIAGNOSIS — C186 Malignant neoplasm of descending colon: Secondary | ICD-10-CM | POA: Diagnosis present

## 2020-12-13 DIAGNOSIS — D500 Iron deficiency anemia secondary to blood loss (chronic): Secondary | ICD-10-CM

## 2020-12-13 DIAGNOSIS — I10 Essential (primary) hypertension: Secondary | ICD-10-CM | POA: Diagnosis not present

## 2020-12-13 DIAGNOSIS — D122 Benign neoplasm of ascending colon: Secondary | ICD-10-CM | POA: Diagnosis not present

## 2020-12-13 DIAGNOSIS — I2510 Atherosclerotic heart disease of native coronary artery without angina pectoris: Secondary | ICD-10-CM | POA: Diagnosis not present

## 2020-12-13 DIAGNOSIS — D120 Benign neoplasm of cecum: Secondary | ICD-10-CM | POA: Diagnosis not present

## 2020-12-13 DIAGNOSIS — Z8719 Personal history of other diseases of the digestive system: Secondary | ICD-10-CM | POA: Diagnosis not present

## 2020-12-13 DIAGNOSIS — D124 Benign neoplasm of descending colon: Secondary | ICD-10-CM | POA: Diagnosis not present

## 2020-12-13 DIAGNOSIS — C787 Secondary malignant neoplasm of liver and intrahepatic bile duct: Secondary | ICD-10-CM | POA: Diagnosis not present

## 2020-12-13 LAB — CBC WITH DIFFERENTIAL (CANCER CENTER ONLY)
Abs Immature Granulocytes: 0.02 10*3/uL (ref 0.00–0.07)
Basophils Absolute: 0 10*3/uL (ref 0.0–0.1)
Basophils Relative: 0 %
Eosinophils Absolute: 0.2 10*3/uL (ref 0.0–0.5)
Eosinophils Relative: 2 %
HCT: 28.3 % — ABNORMAL LOW (ref 36.0–46.0)
Hemoglobin: 8.4 g/dL — ABNORMAL LOW (ref 12.0–15.0)
Immature Granulocytes: 0 %
Lymphocytes Relative: 4 %
Lymphs Abs: 0.3 10*3/uL — ABNORMAL LOW (ref 0.7–4.0)
MCH: 27.8 pg (ref 26.0–34.0)
MCHC: 29.7 g/dL — ABNORMAL LOW (ref 30.0–36.0)
MCV: 93.7 fL (ref 80.0–100.0)
Monocytes Absolute: 0.5 10*3/uL (ref 0.1–1.0)
Monocytes Relative: 7 %
Neutro Abs: 6 10*3/uL (ref 1.7–7.7)
Neutrophils Relative %: 87 %
Platelet Count: 175 10*3/uL (ref 150–400)
RBC: 3.02 MIL/uL — ABNORMAL LOW (ref 3.87–5.11)
RDW: 15.3 % (ref 11.5–15.5)
WBC Count: 6.9 10*3/uL (ref 4.0–10.5)
nRBC: 0 % (ref 0.0–0.2)

## 2020-12-13 LAB — CMP (CANCER CENTER ONLY)
ALT: 7 U/L (ref 0–44)
AST: 11 U/L — ABNORMAL LOW (ref 15–41)
Albumin: 2.8 g/dL — ABNORMAL LOW (ref 3.5–5.0)
Alkaline Phosphatase: 161 U/L — ABNORMAL HIGH (ref 38–126)
Anion gap: 7 (ref 5–15)
BUN: 39 mg/dL — ABNORMAL HIGH (ref 8–23)
CO2: 23 mmol/L (ref 22–32)
Calcium: 8.9 mg/dL (ref 8.9–10.3)
Chloride: 115 mmol/L — ABNORMAL HIGH (ref 98–111)
Creatinine: 0.97 mg/dL (ref 0.44–1.00)
GFR, Estimated: >60 mL/min (ref >60)
Glucose, Bld: 128 mg/dL — ABNORMAL HIGH (ref 70–99)
Potassium: 4.7 mmol/L (ref 3.5–5.1)
Sodium: 145 mmol/L (ref 135–145)
Total Bilirubin: 0.5 mg/dL (ref 0.3–1.2)
Total Protein: 6.4 g/dL — ABNORMAL LOW (ref 6.5–8.1)

## 2020-12-13 LAB — RETIC PANEL
Immature Retic Fract: 13.8 % (ref 2.3–15.9)
RBC.: 2.98 MIL/uL — ABNORMAL LOW (ref 3.87–5.11)
Retic Count, Absolute: 48.6 10*3/uL (ref 19.0–186.0)
Retic Ct Pct: 1.6 % (ref 0.4–3.1)
Reticulocyte Hemoglobin: 31.7 pg (ref >27.9)

## 2020-12-13 LAB — IRON AND TIBC
Iron: 22 ug/dL — ABNORMAL LOW (ref 41–142)
Saturation Ratios: 11 % — ABNORMAL LOW (ref 21–57)
TIBC: 200 ug/dL — ABNORMAL LOW (ref 236–444)
UIBC: 178 ug/dL (ref 120–384)

## 2020-12-13 LAB — FERRITIN: Ferritin: 340 ng/mL — ABNORMAL HIGH (ref 11–307)

## 2020-12-13 LAB — CEA (IN HOUSE-CHCC): CEA (CHCC-In House): 30.28 ng/mL — ABNORMAL HIGH (ref 0.00–5.00)

## 2020-12-13 MED ORDER — HEPARIN SOD (PORK) LOCK FLUSH 100 UNIT/ML IV SOLN
500.0000 [IU] | Freq: Once | INTRAVENOUS | Status: AC
  Administered 2020-12-13: 500 [IU]
  Filled 2020-12-13: qty 5

## 2020-12-13 MED ORDER — SODIUM CHLORIDE 0.9% FLUSH
10.0000 mL | Freq: Once | INTRAVENOUS | Status: AC
Start: 2020-12-13 — End: 2020-12-13
  Administered 2020-12-13: 10 mL
  Filled 2020-12-13: qty 10

## 2020-12-13 NOTE — Telephone Encounter (Signed)
Scheduled 01/31 los, patient has received updated calender.

## 2020-12-14 ENCOUNTER — Encounter: Payer: Self-pay | Admitting: Hematology

## 2020-12-15 ENCOUNTER — Telehealth: Payer: Self-pay

## 2020-12-15 NOTE — Telephone Encounter (Signed)
12/13/2020 OV note and lab results faxed to [facility] at [facility].
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
## 2021-01-27 ENCOUNTER — Inpatient Hospital Stay: Payer: Medicare (Managed Care)

## 2021-01-27 ENCOUNTER — Inpatient Hospital Stay: Payer: Medicare (Managed Care) | Attending: Hematology

## 2021-01-27 ENCOUNTER — Inpatient Hospital Stay: Payer: Medicare (Managed Care) | Admitting: Hematology

## 2021-01-27 DIAGNOSIS — C185 Malignant neoplasm of splenic flexure: Secondary | ICD-10-CM

## 2021-02-11 DEATH — deceased

## 2021-07-09 NOTE — Telephone Encounter (Signed)
This encounter was created in error - please disregard.
